# Patient Record
Sex: Female | Born: 1935 | Race: White | Hispanic: No | Marital: Married | Smoking: Never smoker
Health system: Southern US, Community
[De-identification: ages and names within clinical notes are randomized; demographics above are authoritative.]

## PROBLEM LIST (undated history)

## (undated) DIAGNOSIS — C189 Malignant neoplasm of colon, unspecified: Secondary | ICD-10-CM

## (undated) DIAGNOSIS — K219 Gastro-esophageal reflux disease without esophagitis: Secondary | ICD-10-CM

## (undated) DIAGNOSIS — R42 Dizziness and giddiness: Secondary | ICD-10-CM

## (undated) DIAGNOSIS — G8929 Other chronic pain: Secondary | ICD-10-CM

## (undated) DIAGNOSIS — M549 Dorsalgia, unspecified: Secondary | ICD-10-CM

## (undated) DIAGNOSIS — I10 Essential (primary) hypertension: Secondary | ICD-10-CM

## (undated) DIAGNOSIS — K56609 Unspecified intestinal obstruction, unspecified as to partial versus complete obstruction: Secondary | ICD-10-CM

## (undated) DIAGNOSIS — G629 Polyneuropathy, unspecified: Secondary | ICD-10-CM

## (undated) DIAGNOSIS — Z9221 Personal history of antineoplastic chemotherapy: Secondary | ICD-10-CM

## (undated) DIAGNOSIS — K5792 Diverticulitis of intestine, part unspecified, without perforation or abscess without bleeding: Secondary | ICD-10-CM

## (undated) DIAGNOSIS — M419 Scoliosis, unspecified: Secondary | ICD-10-CM

## (undated) DIAGNOSIS — M1990 Unspecified osteoarthritis, unspecified site: Secondary | ICD-10-CM

## (undated) DIAGNOSIS — I509 Heart failure, unspecified: Secondary | ICD-10-CM

## (undated) DIAGNOSIS — N281 Cyst of kidney, acquired: Secondary | ICD-10-CM

## (undated) DIAGNOSIS — D649 Anemia, unspecified: Secondary | ICD-10-CM

## (undated) DIAGNOSIS — K449 Diaphragmatic hernia without obstruction or gangrene: Secondary | ICD-10-CM

## (undated) DIAGNOSIS — K2270 Barrett's esophagus without dysplasia: Secondary | ICD-10-CM

## (undated) DIAGNOSIS — J329 Chronic sinusitis, unspecified: Secondary | ICD-10-CM

## (undated) DIAGNOSIS — E7800 Pure hypercholesterolemia, unspecified: Secondary | ICD-10-CM

## (undated) DIAGNOSIS — Z87828 Personal history of other (healed) physical injury and trauma: Secondary | ICD-10-CM

## (undated) HISTORY — PX: COLON SURGERY: SHX602

## (undated) HISTORY — DX: Barrett's esophagus without dysplasia: K22.70

## (undated) HISTORY — DX: Dorsalgia, unspecified: M54.9

## (undated) HISTORY — PX: BREAST BIOPSY: SHX20

## (undated) HISTORY — PX: COLON RESECTION: SHX5231

## (undated) HISTORY — PX: APPENDECTOMY: SHX54

## (undated) HISTORY — PX: OTHER SURGICAL HISTORY: SHX169

## (undated) HISTORY — DX: Anemia, unspecified: D64.9

## (undated) HISTORY — DX: Chronic sinusitis, unspecified: J32.9

## (undated) HISTORY — DX: Unspecified intestinal obstruction, unspecified as to partial versus complete obstruction: K56.609

## (undated) HISTORY — DX: Polyneuropathy, unspecified: G62.9

## (undated) HISTORY — DX: Malignant neoplasm of colon, unspecified: C18.9

## (undated) HISTORY — DX: Unspecified osteoarthritis, unspecified site: M19.90

## (undated) HISTORY — DX: Gastro-esophageal reflux disease without esophagitis: K21.9

## (undated) HISTORY — DX: Other chronic pain: G89.29

## (undated) HISTORY — DX: Dizziness and giddiness: R42

## (undated) HISTORY — DX: Pure hypercholesterolemia, unspecified: E78.00

## (undated) HISTORY — DX: Diaphragmatic hernia without obstruction or gangrene: K44.9

## (undated) HISTORY — DX: Essential (primary) hypertension: I10

## (undated) HISTORY — PX: EXCISIONAL HEMORRHOIDECTOMY: SHX1541

---

## 1980-02-15 HISTORY — PX: ABDOMINAL HYSTERECTOMY: SHX81

## 1989-02-14 HISTORY — PX: EXPLORATORY LAPAROTOMY: SUR591

## 1992-02-15 HISTORY — PX: CHOLECYSTECTOMY: SHX55

## 2004-03-09 ENCOUNTER — Ambulatory Visit: Payer: Self-pay | Admitting: Internal Medicine

## 2004-06-02 ENCOUNTER — Ambulatory Visit: Payer: Self-pay | Admitting: Internal Medicine

## 2004-08-19 ENCOUNTER — Ambulatory Visit: Payer: Self-pay | Admitting: Rheumatology

## 2004-10-05 ENCOUNTER — Ambulatory Visit: Payer: Self-pay | Admitting: Physical Medicine and Rehabilitation

## 2004-10-05 ENCOUNTER — Encounter
Admission: RE | Admit: 2004-10-05 | Discharge: 2005-01-03 | Payer: Self-pay | Admitting: Physical Medicine and Rehabilitation

## 2004-11-05 ENCOUNTER — Ambulatory Visit: Payer: Self-pay | Admitting: Physical Medicine and Rehabilitation

## 2004-12-01 ENCOUNTER — Ambulatory Visit: Payer: Self-pay | Admitting: Physical Medicine & Rehabilitation

## 2004-12-21 ENCOUNTER — Ambulatory Visit: Payer: Self-pay | Admitting: Unknown Physician Specialty

## 2005-01-28 ENCOUNTER — Encounter
Admission: RE | Admit: 2005-01-28 | Discharge: 2005-04-28 | Payer: Self-pay | Admitting: Physical Medicine and Rehabilitation

## 2005-01-28 ENCOUNTER — Ambulatory Visit: Payer: Self-pay | Admitting: Physical Medicine and Rehabilitation

## 2005-02-28 ENCOUNTER — Ambulatory Visit: Payer: Self-pay | Admitting: Physical Medicine and Rehabilitation

## 2005-04-18 ENCOUNTER — Ambulatory Visit: Payer: Self-pay | Admitting: Oncology

## 2005-04-20 ENCOUNTER — Ambulatory Visit: Payer: Self-pay | Admitting: Internal Medicine

## 2005-04-26 ENCOUNTER — Encounter
Admission: RE | Admit: 2005-04-26 | Discharge: 2005-07-25 | Payer: Self-pay | Admitting: Physical Medicine and Rehabilitation

## 2005-04-26 ENCOUNTER — Ambulatory Visit: Payer: Self-pay | Admitting: Physical Medicine and Rehabilitation

## 2005-07-15 ENCOUNTER — Ambulatory Visit: Payer: Self-pay | Admitting: Physical Medicine and Rehabilitation

## 2005-07-27 ENCOUNTER — Encounter
Admission: RE | Admit: 2005-07-27 | Discharge: 2005-10-25 | Payer: Self-pay | Admitting: Physical Medicine & Rehabilitation

## 2005-07-27 ENCOUNTER — Ambulatory Visit: Payer: Self-pay | Admitting: Physical Medicine & Rehabilitation

## 2005-09-01 ENCOUNTER — Ambulatory Visit: Payer: Self-pay | Admitting: Physical Medicine and Rehabilitation

## 2005-09-01 ENCOUNTER — Encounter
Admission: RE | Admit: 2005-09-01 | Discharge: 2005-11-30 | Payer: Self-pay | Admitting: Physical Medicine and Rehabilitation

## 2005-10-28 ENCOUNTER — Ambulatory Visit: Payer: Self-pay | Admitting: Physical Medicine and Rehabilitation

## 2005-10-31 ENCOUNTER — Ambulatory Visit (HOSPITAL_COMMUNITY)
Admission: RE | Admit: 2005-10-31 | Discharge: 2005-10-31 | Payer: Self-pay | Admitting: Physical Medicine & Rehabilitation

## 2005-11-25 ENCOUNTER — Encounter
Admission: RE | Admit: 2005-11-25 | Discharge: 2006-02-23 | Payer: Self-pay | Admitting: Physical Medicine and Rehabilitation

## 2006-01-19 ENCOUNTER — Ambulatory Visit: Payer: Self-pay | Admitting: Physical Medicine and Rehabilitation

## 2006-02-21 ENCOUNTER — Encounter
Admission: RE | Admit: 2006-02-21 | Discharge: 2006-05-22 | Payer: Self-pay | Admitting: Physical Medicine and Rehabilitation

## 2006-02-21 ENCOUNTER — Ambulatory Visit: Payer: Self-pay | Admitting: Physical Medicine and Rehabilitation

## 2006-03-21 ENCOUNTER — Ambulatory Visit: Payer: Self-pay | Admitting: Physical Medicine and Rehabilitation

## 2006-04-24 ENCOUNTER — Encounter
Admission: RE | Admit: 2006-04-24 | Discharge: 2006-07-23 | Payer: Self-pay | Admitting: Physical Medicine & Rehabilitation

## 2006-04-25 ENCOUNTER — Ambulatory Visit: Payer: Self-pay | Admitting: Physical Medicine & Rehabilitation

## 2006-04-26 ENCOUNTER — Ambulatory Visit: Payer: Self-pay | Admitting: Internal Medicine

## 2006-05-15 ENCOUNTER — Ambulatory Visit: Payer: Self-pay | Admitting: Physical Medicine and Rehabilitation

## 2006-05-17 ENCOUNTER — Ambulatory Visit (HOSPITAL_COMMUNITY)
Admission: RE | Admit: 2006-05-17 | Discharge: 2006-05-17 | Payer: Self-pay | Admitting: Physical Medicine and Rehabilitation

## 2006-06-13 ENCOUNTER — Encounter
Admission: RE | Admit: 2006-06-13 | Discharge: 2006-09-11 | Payer: Self-pay | Admitting: Physical Medicine and Rehabilitation

## 2006-07-14 ENCOUNTER — Ambulatory Visit: Payer: Self-pay | Admitting: Physical Medicine and Rehabilitation

## 2006-10-10 ENCOUNTER — Ambulatory Visit: Payer: Self-pay | Admitting: Physical Medicine and Rehabilitation

## 2006-10-10 ENCOUNTER — Encounter
Admission: RE | Admit: 2006-10-10 | Discharge: 2006-11-14 | Payer: Self-pay | Admitting: Physical Medicine and Rehabilitation

## 2006-11-22 ENCOUNTER — Encounter
Admission: RE | Admit: 2006-11-22 | Discharge: 2006-12-21 | Payer: Self-pay | Admitting: Physical Medicine & Rehabilitation

## 2006-11-23 ENCOUNTER — Ambulatory Visit: Payer: Self-pay | Admitting: Physical Medicine & Rehabilitation

## 2006-12-05 ENCOUNTER — Encounter
Admission: RE | Admit: 2006-12-05 | Discharge: 2007-03-05 | Payer: Self-pay | Admitting: Physical Medicine and Rehabilitation

## 2006-12-05 ENCOUNTER — Ambulatory Visit: Payer: Self-pay | Admitting: Physical Medicine and Rehabilitation

## 2006-12-06 ENCOUNTER — Ambulatory Visit (HOSPITAL_COMMUNITY)
Admission: RE | Admit: 2006-12-06 | Discharge: 2006-12-06 | Payer: Self-pay | Admitting: Physical Medicine and Rehabilitation

## 2007-01-03 ENCOUNTER — Ambulatory Visit (HOSPITAL_COMMUNITY)
Admission: RE | Admit: 2007-01-03 | Discharge: 2007-01-03 | Payer: Self-pay | Admitting: Physical Medicine and Rehabilitation

## 2007-01-30 ENCOUNTER — Ambulatory Visit: Payer: Self-pay | Admitting: Physical Medicine and Rehabilitation

## 2007-03-13 ENCOUNTER — Encounter
Admission: RE | Admit: 2007-03-13 | Discharge: 2007-06-11 | Payer: Self-pay | Admitting: Physical Medicine and Rehabilitation

## 2007-03-13 ENCOUNTER — Ambulatory Visit: Payer: Self-pay | Admitting: Physical Medicine and Rehabilitation

## 2007-04-20 ENCOUNTER — Ambulatory Visit: Payer: Self-pay | Admitting: Physical Medicine and Rehabilitation

## 2007-05-21 ENCOUNTER — Ambulatory Visit: Payer: Self-pay | Admitting: Physical Medicine and Rehabilitation

## 2007-05-22 ENCOUNTER — Ambulatory Visit: Payer: Self-pay | Admitting: Internal Medicine

## 2007-06-15 ENCOUNTER — Encounter
Admission: RE | Admit: 2007-06-15 | Discharge: 2007-09-13 | Payer: Self-pay | Admitting: Physical Medicine and Rehabilitation

## 2007-06-18 ENCOUNTER — Ambulatory Visit: Payer: Self-pay | Admitting: Physical Medicine and Rehabilitation

## 2007-07-16 ENCOUNTER — Ambulatory Visit (HOSPITAL_COMMUNITY)
Admission: RE | Admit: 2007-07-16 | Discharge: 2007-07-16 | Payer: Self-pay | Admitting: Physical Medicine and Rehabilitation

## 2007-07-16 ENCOUNTER — Ambulatory Visit: Payer: Self-pay | Admitting: Physical Medicine and Rehabilitation

## 2007-07-16 ENCOUNTER — Encounter: Admission: RE | Admit: 2007-07-16 | Discharge: 2007-10-14 | Payer: Self-pay | Admitting: Anesthesiology

## 2007-07-17 ENCOUNTER — Ambulatory Visit: Payer: Self-pay | Admitting: Anesthesiology

## 2007-08-07 ENCOUNTER — Ambulatory Visit: Payer: Self-pay | Admitting: Anesthesiology

## 2007-10-12 ENCOUNTER — Encounter
Admission: RE | Admit: 2007-10-12 | Discharge: 2008-01-10 | Payer: Self-pay | Admitting: Physical Medicine and Rehabilitation

## 2007-10-15 ENCOUNTER — Ambulatory Visit: Payer: Self-pay | Admitting: Physical Medicine and Rehabilitation

## 2007-11-12 ENCOUNTER — Ambulatory Visit: Payer: Self-pay | Admitting: Physical Medicine and Rehabilitation

## 2007-12-12 ENCOUNTER — Ambulatory Visit: Payer: Self-pay | Admitting: Physical Medicine and Rehabilitation

## 2007-12-25 ENCOUNTER — Ambulatory Visit: Payer: Self-pay | Admitting: Unknown Physician Specialty

## 2008-01-18 ENCOUNTER — Encounter
Admission: RE | Admit: 2008-01-18 | Discharge: 2008-04-17 | Payer: Self-pay | Admitting: Physical Medicine and Rehabilitation

## 2008-01-21 ENCOUNTER — Ambulatory Visit: Payer: Self-pay | Admitting: Physical Medicine and Rehabilitation

## 2008-02-06 ENCOUNTER — Inpatient Hospital Stay: Payer: Self-pay | Admitting: Vascular Surgery

## 2008-02-22 ENCOUNTER — Ambulatory Visit: Payer: Self-pay | Admitting: Internal Medicine

## 2008-03-05 ENCOUNTER — Ambulatory Visit: Payer: Self-pay | Admitting: Physical Medicine and Rehabilitation

## 2008-04-07 ENCOUNTER — Ambulatory Visit: Payer: Self-pay | Admitting: Physical Medicine and Rehabilitation

## 2008-05-05 ENCOUNTER — Encounter
Admission: RE | Admit: 2008-05-05 | Discharge: 2008-08-03 | Payer: Self-pay | Admitting: Physical Medicine and Rehabilitation

## 2008-05-07 ENCOUNTER — Ambulatory Visit: Payer: Self-pay | Admitting: Physical Medicine and Rehabilitation

## 2008-06-10 ENCOUNTER — Ambulatory Visit: Payer: Self-pay | Admitting: Internal Medicine

## 2008-06-30 ENCOUNTER — Ambulatory Visit: Payer: Self-pay | Admitting: Physical Medicine and Rehabilitation

## 2008-08-04 ENCOUNTER — Encounter
Admission: RE | Admit: 2008-08-04 | Discharge: 2008-11-02 | Payer: Self-pay | Admitting: Physical Medicine and Rehabilitation

## 2008-08-06 ENCOUNTER — Ambulatory Visit: Payer: Self-pay | Admitting: Physical Medicine and Rehabilitation

## 2008-09-23 ENCOUNTER — Ambulatory Visit: Payer: Self-pay | Admitting: Urology

## 2008-10-06 ENCOUNTER — Ambulatory Visit: Payer: Self-pay | Admitting: Physical Medicine and Rehabilitation

## 2008-10-27 ENCOUNTER — Ambulatory Visit: Payer: Self-pay | Admitting: Physical Medicine & Rehabilitation

## 2008-10-27 ENCOUNTER — Encounter
Admission: RE | Admit: 2008-10-27 | Discharge: 2008-10-27 | Payer: Self-pay | Admitting: Physical Medicine & Rehabilitation

## 2008-12-09 ENCOUNTER — Encounter
Admission: RE | Admit: 2008-12-09 | Discharge: 2009-02-05 | Payer: Self-pay | Admitting: Physical Medicine and Rehabilitation

## 2008-12-10 ENCOUNTER — Ambulatory Visit: Payer: Self-pay | Admitting: Physical Medicine and Rehabilitation

## 2009-01-19 ENCOUNTER — Ambulatory Visit: Payer: Self-pay | Admitting: Physical Medicine and Rehabilitation

## 2009-03-04 ENCOUNTER — Encounter
Admission: RE | Admit: 2009-03-04 | Discharge: 2009-06-02 | Payer: Self-pay | Admitting: Physical Medicine and Rehabilitation

## 2009-03-04 ENCOUNTER — Ambulatory Visit: Payer: Self-pay | Admitting: Physical Medicine and Rehabilitation

## 2009-04-20 ENCOUNTER — Ambulatory Visit: Payer: Self-pay | Admitting: Physical Medicine and Rehabilitation

## 2009-05-21 ENCOUNTER — Encounter
Admission: RE | Admit: 2009-05-21 | Discharge: 2009-08-19 | Payer: Self-pay | Admitting: Physical Medicine & Rehabilitation

## 2009-05-21 ENCOUNTER — Ambulatory Visit: Payer: Self-pay | Admitting: Physical Medicine & Rehabilitation

## 2009-07-02 ENCOUNTER — Ambulatory Visit: Payer: Self-pay | Admitting: Physical Medicine & Rehabilitation

## 2009-08-10 ENCOUNTER — Ambulatory Visit: Payer: Self-pay | Admitting: Physical Medicine & Rehabilitation

## 2009-09-21 ENCOUNTER — Ambulatory Visit: Payer: Self-pay | Admitting: Urology

## 2009-09-22 ENCOUNTER — Encounter
Admission: RE | Admit: 2009-09-22 | Discharge: 2009-12-21 | Payer: Self-pay | Admitting: Physical Medicine & Rehabilitation

## 2009-10-01 ENCOUNTER — Ambulatory Visit: Payer: Self-pay | Admitting: Physical Medicine & Rehabilitation

## 2009-11-02 ENCOUNTER — Ambulatory Visit: Payer: Self-pay | Admitting: Physical Medicine and Rehabilitation

## 2009-11-02 ENCOUNTER — Encounter
Admission: RE | Admit: 2009-11-02 | Discharge: 2010-01-31 | Payer: Self-pay | Source: Home / Self Care | Attending: Physical Medicine and Rehabilitation | Admitting: Physical Medicine and Rehabilitation

## 2009-11-30 ENCOUNTER — Ambulatory Visit: Payer: Self-pay | Admitting: Physical Medicine and Rehabilitation

## 2009-12-03 ENCOUNTER — Ambulatory Visit: Payer: Self-pay

## 2009-12-30 ENCOUNTER — Ambulatory Visit: Payer: Self-pay | Admitting: Physical Medicine and Rehabilitation

## 2010-02-15 ENCOUNTER — Encounter
Admission: RE | Admit: 2010-02-15 | Discharge: 2010-03-16 | Payer: Self-pay | Source: Home / Self Care | Attending: Physical Medicine and Rehabilitation | Admitting: Physical Medicine and Rehabilitation

## 2010-02-17 ENCOUNTER — Ambulatory Visit
Admission: RE | Admit: 2010-02-17 | Discharge: 2010-02-17 | Payer: Self-pay | Source: Home / Self Care | Attending: Physical Medicine and Rehabilitation | Admitting: Physical Medicine and Rehabilitation

## 2010-04-19 ENCOUNTER — Ambulatory Visit (HOSPITAL_COMMUNITY)
Admission: RE | Admit: 2010-04-19 | Discharge: 2010-04-19 | Disposition: A | Discharge status: Home / Self Care | Payer: Medicare Other | Source: Ambulatory Visit | Attending: Physical Medicine and Rehabilitation | Admitting: Physical Medicine and Rehabilitation

## 2010-04-19 ENCOUNTER — Ambulatory Visit (HOSPITAL_BASED_OUTPATIENT_CLINIC_OR_DEPARTMENT_OTHER): Payer: Medicare Other | Admitting: Physical Medicine and Rehabilitation

## 2010-04-19 ENCOUNTER — Other Ambulatory Visit: Payer: Self-pay | Admitting: Physical Medicine and Rehabilitation

## 2010-04-19 ENCOUNTER — Encounter: Payer: Medicare Other | Attending: Physical Medicine and Rehabilitation

## 2010-04-19 DIAGNOSIS — M79609 Pain in unspecified limb: Secondary | ICD-10-CM

## 2010-04-19 DIAGNOSIS — R52 Pain, unspecified: Secondary | ICD-10-CM

## 2010-04-19 DIAGNOSIS — M4310 Spondylolisthesis, site unspecified: Secondary | ICD-10-CM | POA: Insufficient documentation

## 2010-04-19 DIAGNOSIS — M545 Low back pain, unspecified: Secondary | ICD-10-CM | POA: Insufficient documentation

## 2010-04-19 DIAGNOSIS — M48061 Spinal stenosis, lumbar region without neurogenic claudication: Secondary | ICD-10-CM

## 2010-04-19 DIAGNOSIS — M539 Dorsopathy, unspecified: Secondary | ICD-10-CM | POA: Insufficient documentation

## 2010-04-19 DIAGNOSIS — M76899 Other specified enthesopathies of unspecified lower limb, excluding foot: Secondary | ICD-10-CM

## 2010-04-19 DIAGNOSIS — M51379 Other intervertebral disc degeneration, lumbosacral region without mention of lumbar back pain or lower extremity pain: Secondary | ICD-10-CM | POA: Insufficient documentation

## 2010-04-19 DIAGNOSIS — Z79899 Other long term (current) drug therapy: Secondary | ICD-10-CM | POA: Insufficient documentation

## 2010-04-19 DIAGNOSIS — G8929 Other chronic pain: Secondary | ICD-10-CM | POA: Insufficient documentation

## 2010-04-19 DIAGNOSIS — M25559 Pain in unspecified hip: Secondary | ICD-10-CM | POA: Insufficient documentation

## 2010-04-19 DIAGNOSIS — M4120 Other idiopathic scoliosis, site unspecified: Secondary | ICD-10-CM | POA: Insufficient documentation

## 2010-04-19 DIAGNOSIS — M1710 Unilateral primary osteoarthritis, unspecified knee: Secondary | ICD-10-CM | POA: Insufficient documentation

## 2010-04-19 DIAGNOSIS — Q762 Congenital spondylolisthesis: Secondary | ICD-10-CM | POA: Insufficient documentation

## 2010-04-19 DIAGNOSIS — M4000 Postural kyphosis, site unspecified: Secondary | ICD-10-CM | POA: Insufficient documentation

## 2010-04-19 DIAGNOSIS — M129 Arthropathy, unspecified: Secondary | ICD-10-CM | POA: Insufficient documentation

## 2010-04-19 DIAGNOSIS — M5137 Other intervertebral disc degeneration, lumbosacral region: Secondary | ICD-10-CM | POA: Insufficient documentation

## 2010-05-17 ENCOUNTER — Encounter
Payer: Medicare Other | Attending: Physical Medicine and Rehabilitation | Admitting: Physical Medicine and Rehabilitation

## 2010-05-17 DIAGNOSIS — M76899 Other specified enthesopathies of unspecified lower limb, excluding foot: Secondary | ICD-10-CM | POA: Insufficient documentation

## 2010-05-17 DIAGNOSIS — M47817 Spondylosis without myelopathy or radiculopathy, lumbosacral region: Secondary | ICD-10-CM | POA: Insufficient documentation

## 2010-05-17 DIAGNOSIS — M48061 Spinal stenosis, lumbar region without neurogenic claudication: Secondary | ICD-10-CM

## 2010-05-17 DIAGNOSIS — M4180 Other forms of scoliosis, site unspecified: Secondary | ICD-10-CM | POA: Insufficient documentation

## 2010-05-17 DIAGNOSIS — M5137 Other intervertebral disc degeneration, lumbosacral region: Secondary | ICD-10-CM | POA: Insufficient documentation

## 2010-05-17 DIAGNOSIS — M51379 Other intervertebral disc degeneration, lumbosacral region without mention of lumbar back pain or lower extremity pain: Secondary | ICD-10-CM | POA: Insufficient documentation

## 2010-05-17 DIAGNOSIS — M4716 Other spondylosis with myelopathy, lumbar region: Secondary | ICD-10-CM

## 2010-05-17 DIAGNOSIS — M545 Low back pain, unspecified: Secondary | ICD-10-CM | POA: Insufficient documentation

## 2010-05-17 DIAGNOSIS — M4120 Other idiopathic scoliosis, site unspecified: Secondary | ICD-10-CM

## 2010-05-17 DIAGNOSIS — M1710 Unilateral primary osteoarthritis, unspecified knee: Secondary | ICD-10-CM | POA: Insufficient documentation

## 2010-06-15 ENCOUNTER — Ambulatory Visit: Payer: Medicare Other

## 2010-06-29 NOTE — Assessment & Plan Note (Signed)
[name] is a very pleasant 77 year old woman who is accompanied by her  daughter to our Pain and Rehabilitative Clinic today.  She has a history  of chronic low back pain secondary to lumbar spondylosis.  She also has  a history of diminished ambulation capacity and has lumbar spinal  stenosis with a fairly significant lumbar curve/scoliosis.  She also has  intermittent trochanteric bursitis, iliotibial band syndrome, and  bilateral knee osteoarthritis.  She is back in to our clinic today for  brief recheck.  She does not need refill of her medications.  She has  been taking p.r.n. Mobic, Darvocet, and occasional oxycodone up to 1 to  half a tablet per day.  She has discontinued the Neurontin due to leg  cramps.  However, overall, she has been staying fairly functional.   In the last several weeks, she has been seen in our Physical Therapy  Department at [facility] and has been working on some  lower extremity strengthening and improving her balance.   She is back in today, states her average pain is about 5 on a scale of  10.  Her pain is about 8 on a scale of 10.  Pain is localized to the low  back, it radiates down both lateral hips, bilateral knees are involved  as well.   Pain interferes significantly with activities.  Pain is worse with  walking, bending, and standing.  Improves with rest, heat, medication,  and injection.  She gets fair relief with current meds.   MEDICATIONS:  Which are provided through this clinic include;  1. Mobic on a p.r.n. basis 7.5 daily.  2. Lidoderm p.r.n.  3. Neurontin has been discontinued due to leg cramps.  4. Darvocet-N 100 up to 1-2 tablets per day and p.r.n. up to 1-1/2 to      1 tablet per day of oxycodone IR.   She is living independently.  She is able to take care of her self care  needs.  She admits to some anxiety, denies depression and anxiety.  She  is able to walk few minutes without pain.  She does use a cane for  ambulation.  She is able to climb stairs and she is able to drive.   Past medical, social, and family are otherwise unchanged.   PHYSICAL EXAMINATION:  VITAL SIGNS:  Blood pressure is 144/71, pulse is  70, respirations 18, and 96% saturation on room air.  GENERAL:  She is a well-developed, well-nourished elderly female who  does not appear in any distress and appears her stated age.   She is oriented x3.  Speech is clear.  Affect is bright.  She is alert,  cooperative and pleasant.  She follows commands without any difficulty.   Cranial nerves and coordination are grossly intact.  Reflexes are  diminished in the lower extremities.  Sensation is overall intact in the  lower extremities with light touch.  Her motor strength is 5/5 at hip  flexors, knee extensors, dorsiflexors, and plantar flexors.  She is  unable to give full strength in the right hip flexor due to pain in the  low back when she attempts to flex against pressure.   She is able to transition from sitting to standing by pushing off with  her arms.  Her gait is relatively stable.  She does have difficulty with  tandem gait, is unable to perform this.  Romberg test is, however,  performed adequately.  She has limitations in the  lumbar motion in all  planes.  Pain is noted with extension.   IMPRESSION:  1. History of lumbar spondylosis/scoliosis/spinal stenosis.  2. Bilateral trochanteric bursitis with iliotibial band syndrome,      right worse than left.  3. Bilateral knee osteoarthritis.   PLAN:  Continue physical therapy program to work on lower extremity  strength deficits and balance.  Call over to [facility] at  [phone], suggested considering ultrasound for iliotibial band syndrome  and  trochanteric bursitis.  They will consider this at her next visit.  I will see her back in a month.  She does not need refills on any of her  medications today.   Her family doctor apparently has arranged for her  to follow with her  neurosurgeon Dr. Dorothe Pea who is in [place].           ______________________________  Brantley Stage, M.D.     DMK/MedQ  D:  11/12/2007 13:08:51  T:  11/13/2007 02:32:30  Job #:  [id number]

## 2010-06-29 NOTE — Assessment & Plan Note (Signed)
[name] is a 77 year old woman who is being followed in our pain and  rehabilitative clinic for predominantly low back and leg pain.   She is known to have lumbar spondylosis/scoliosis and has intermittent  nerve root irritation.  She also has intermittent trochanteric bursitis  as well as mild knee osteoarthritis.   She is back in today and states that after the injection on January 03, 2007 into her left hip she had overall improvement of her leg pain and  had very little pain for at least 3 weeks afterwards.  She has some mild  discomfort now going down both legs which does not appear to be related  to her bursitis.   She also had noted that when she was taking Neurontin 300 mg at night  did not seem to give her as much relief during the day with when she was  taking 100 mg in the morning 2 tablets.  She is requesting to return  back to the dose of Neurontin which included 100 mg two in the morning  and 3 in the evening.   She states she can walk a bit longer and has less leg pain when she is  on little bit higher dose of Neurontin.   Her pain currently in the office is about a 4 to a 5 on a scale of 10,  worst pain is about an 8 this month.  Pain is described as stabbing,  sharp and burning.  She has had some intermittent low back pains over  the last few weeks, sharp pain in the left buttock region.  However,  neither the leg pain nor the buttock or back pain is present this  morning, overall she is doing good toady.   Pain is typically worse with walking and standing, improves with rest,  heat, medication and injections.  She is getting good relief with the  current medications prescribed by our clinic.  Pill counts today reveal  she really has not used any Darvocet in the last month, she has a full  prescription yet and she has 19 oxycodone left from 30 that was  prescribed last month.  She states that she has not needed this  medication after the injection because she  got such a good amount of  relief with the injections.   Also, she takes Mobic 7.5 once a day, Neurontin she is currently on 300  mg at night, she uses Lidoderm on a p.r.n. basis as well.   She is limited significantly in how far she can walk, she is independent  with her self care, needs some assistance with higher level household  activities.   Admits to trouble walking and occasional dizziness.  She has had inner  ear problems intermittently.   Denies depression or suicidal ideation.   Past medical, social, family history are all unchanged from last visit.   EXAMINATION:  Blood pressure is 164/75, pulse 82, respirations 28, 97%  saturated on room air.  She is a well-developed, well-nourished elderly  female who appears her stated age.  She is oriented x3, her speech is  clear, her affect is bright.  She is alert, cooperative and pleasant.  She follows command without difficulty.   Transitions quite easily from sitting to standing today, gait displays  good balance overall.  Had some difficulty with tandem gait but overall  does fairly well.  Romberg's test is performed adequately as well.   Reflexes are diminished in the lower extremities, motor  strength is 5/5  at hip flexors, knee extensors, dorsiflexors and plantar flexors.  Tenderness is noted along the medial joint line in both lower  extremities at the knees.  No effusion is appreciated and she has good  joint stability bilaterally.   Radiographs were reviewed with her today, she had right knee radiographs  done January 03, 2007 read by Dr. Ruffin Frederick.  Shows no acute right knee  fracture or subluxation, mild narrowing of the medial joint compartment  was noted.   These were reviewed with [name] today as well.   IMPRESSION:  1. Overall improvement in right trochanteric bursitis, status post      injection last month.  2. Lumbar spondylosis/scoliosis with symptoms of spinal stenosis as      well.  3. Mild knee  osteoarthritis, medial compartment.   PLAN:  We will refill the following medications for [name] today to  include:  1. Neurontin 100 mg two tablets in the morning, one tablet in the p.m.      and three tablets p.o. nightly.  She will drop the afternoon dose      if she has any dizziness or oversedation from it.  2. We will also refill her Mobic 7.5 mg one p.o. daily p.r.n. #30.      She is tolerating this well, has no abdominal complaints, has been      on this many years.   Will see her back in a month.  Encouraged her to use her cane when she  is out in the community walking.  She has been stable on the above  medications and has not had any problems with oversedation, constipation  or balance problems from them.           ______________________________  Brantley Stage, M.D.     DMK/MedQ  D:  01/31/2007 11:26:54  T:  01/31/2007 16:12:16  Job #:  [id number]

## 2010-06-29 NOTE — Assessment & Plan Note (Signed)
[name] is an 77 year old married female who is followed in our Pain  and Rehabilitative Clinic for multiple chronic pain complaints, which  revolved mostly around her low back and lower extremities.   She is felt to have a component of lumbar spinal stenosis.  She has  bilateral knee osteoarthritis and intermittent trochanteric bursitis.   She states average pain over the last month is improved.  From a month  previous, her average pain is about 6 on a scale of 10.  She states that  she is walking and able to be up standing no longer, last month it was  less than 5 minutes, this month it has been about 10 minutes.  She  attributes this to taking 1 Darvocet every morning, now with her Lyrica.  She finds the Lyrica to be beneficial as well.  In the last month, she  has used one 5 mg oxycodone tablets.   Pain again is typically localized to the posterior hip region radiating  down the leg laterally to the foot, especially on the right.   She uses a cane for ambulation.  She is able to climb stairs and drive.  She is independent with self-care.  Admits to some anxiety, but denies  depression or suicidal ideation.  Denies problems controlling bowel or  bladder.  Denies problems with oversedation or constipation.   Past medical, social, and family history were unchanged from previous  visit.   PHYSICAL EXAMINATION:  VITAL SIGNS:  Blood pressure 144/64, pulse 58,  respiration 18, and 97% saturated on room air.  GENERAL:  She is a well-developed, well-nourished elderly female, who  does not appear in any distress.  She is oriented x3.  Speech is clear.  Affect is bright.  She is alert, cooperative, and pleasant.  Follows  commands without difficulty and answers questions appropriately.  EXTREMITIES:  She transitions from sitting to standing with ease, gait  in the room is not antalgic, slow, and careful, some difficulty with  tandem gait.  Romberg test is performed adequately.   He is  able to step up to the exam table.  Reflexes are diminished at  bilateral patellar and Achilles tendons.  No abnormal tone is noted.  No  clonus is noted.  No tremors are appreciated.   She has good strength in both lower extremities, 5/5 at hip flexors,  knee extensors, dorsiflexors, and plantar flexors.   Limitations in range of motion in lumbar spine in all planes.  Internal  and external rotation of hips does not aggravate her today.   IMPRESSION:  1. Lumbago with history of lumbar spondylosis.  2. Lower extremity symptoms, suggestive of spinal stenosis.  3. Intermittent trochanteric bursitis.  4. History of bilateral knee osteoarthritis.   PLAN:  The patient does not need refills on her medications today.  She  has used 1 oxycodone in the last month, she has 31 left from previous  refill.  She has 29 Darvocet-N 100 left.  She typically is taking 1  Darvocet-N 100 per day in the morning and she typically does not use  oxycodone not more than 1 time per month, she uses it for severe pain.  We will see her back in 2 months.  She has a visit with Dr. Amanda Pea  coming up September 18, 2008.  [name]  has been taking medications as prescribed.  She does not exhibit any  Aberrant behavior with the use .  Pill counts are appropriate and she  reports overall improved functional status with the use.  We will see  her back in 2 months.           ______________________________  Brantley Stage, M.D.     DMK/MedQ  D:  08/06/2008 12:15:41  T:  08/07/2008 01:31:28  Job #:  [id number]

## 2010-06-29 NOTE — Assessment & Plan Note (Signed)
[name] is a very pleasant married 77 year old woman who is  followed in our Pain and Rehabilitative Clinic for chronic low back pain  and leg pain.  She is back in today and states she has been having some  trouble with pain radiating from the low back around the right lateral  hip to the right groin and down the inside of her leg on the right.   She also has bilateral lateral leg pain and pain in the low back  regions.  Her average pain is about a 9 currently in our clinic.  Pain  is described as constant, burning, stabbing, and aching in nature.   She reports that the trochanteric bursitis injection that she received  on Jun 18, 2007 has significantly helped her pain and the right lateral  leg pain is significantly improved after the injection.   She reports overall good relief with medications that she uses.  She has  been on Mobic 7.5 mg daily.  She has been off of it to enable her to  undergo an epidural steroid injection, and she states that her pain has  been much worse since she has been off of her Mobic.  Because of this,  she has increased the use of her Darvocet a little more, as well as she  has taken a few more oxycodone.  She typically does not use more than 2  Darvocet a day nor more than 1 oxycodone per day.   She is limited in how long she can ambulate.  It is variable, but not  much more than 10 minutes.  She is able to climb stairs, but slowly, and  she is able to drive short distances.   She is independent with feeding, dressing, bathing, and toileting.  Requires some assistance with meal prep, household duties, and shopping.   She admits to some anxiety, but denies depression, anxiety, or suicidal  ideation.  Denies problems controlling bowel or bladder.  Review of  systems otherwise noncontributory.   PAST MEDICAL/SOCIAL/FAMILY HISTORY:  Unchanged since our last visit on  Jun 18, 2007.   MEDICATIONS PRESCRIBED BY THIS CLINIC:  1. Mobic 7.5 daily.   However, she has been off it at least a week, I      believe, at this time.  2. Neurontin 300 mg 1 p.o. nightly.  3. Darvocet-N 100 one p.o. b.i.d. p.r.n.  4. Oxycodone 5 mg 1 p.o. daily.  5. Lidoderm p.r.n.   EXAM:  Her blood pressure is 136/59, pulse 58, respirations 18, 98%  saturation on room air.  She is a well-developed elderly female who does not appear in any  distress.   She is oriented x3.  Her speech is clear.  Her affect is bright, alert,  cooperative, and pleasant, and she follows commands without any  difficulties.  She is able to transition from sitting to standing  without much difficulty today.  Her gait in the room is antalgic with  decreased weightbearing on the right.  She has limitations in lumbar  motion in all planes.  Her reflexes are 0 at the patellar tendons, as  well as at the Achilles tendons.  She has 5/5 strength at left hip  flexors, knee extensors, dorsiflexors, and plantar flexors.  She has 4/5  strength at right hip flexors and knee extensors.  Dorsiflexors and  plantar flexors on the right are 5/5.   Internal and external rotation is mildly limited in both right and left  hip,  but do not increase her pain in the groin or in the posterior hip.   Overall, her balance is quite good.  She has normal tone throughout.  She has some mild tenderness over the trochanters, however much improved  on the right today.   IMPRESSION:  1. History of lumbar spondylosis and lumbar spinal stenosis.  Ms.      Yazzie has a lumbar scoliosis where the apex of the curve is located      at L2-3 and convex to the left.  2. Bilateral trochanteric bursitis, which is overall improved after      last injections.  3. Mild bilateral knee osteoarthritis, especially in the medial      compartment.   PLAN:  I would like her to follow up with Dr. Stevphen Rochester for epidural  steroid injections.  [name] has multilevel lumbar degenerative  changes with a lumbar curve with the apex at  L2-3 convex left.  I  believe some of the pain that she is experiencing radiating from the  back wrapping around the right anterior thigh to the groin and down the  leg medially may be related to one of the upper lumbar roots being  irritated.  I would like her to follow up with Dr. Stevphen Rochester for epidural  steroid injections to see if we could help her with some of this  proximal leg pain.  She states she is not having much trouble with left  leg pain at this time, but on occasion, she does have some problems with  it.  She will follow up with Dr. Stevphen Rochester tomorrow, and I will see her  back in a month.  She does not need any refills on her medications at  this time.  [name] uses her medications as prescribed.  She has not  exhibited any aberrant behavior.  She has appropriate pill count and she  has not suffered any complications from these medications causing  oversedation or constipation.  She is able to maintain a functional  lifestyle, albeit somewhat limited at times due to her leg pain.           ______________________________  Brantley Stage, M.D.     DMK/MedQ  D:  07/16/2007 13:46:57  T:  07/16/2007 14:30:42  Job #:  [id number]

## 2010-06-29 NOTE — Assessment & Plan Note (Signed)
[name] is a very pleasant 77 year old married woman who is followed  in our Pain and Rehabilitative Clinic predominantly for low back and leg  pain.  Her low back and leg pain are felt to be related to lumbar  spondylosis/scoliosis, and spinal stenosis.  Intermittently she is also  bothered by trochanteric bursitis.   She is back in today and states her average pain had been about an 8 on  a scale of 10.  However, today and yesterday, she has been doing very  well, pain down to a 4.  Her pain is typically located in the low back  region, radiates laterally down the legs.  Pain is typically worse when  she is up standing and walking, improves with rest, heat and medication  as well as injections.   She is very limited in how far she can walk.  She can climb stairs.  She  is able to drive short distances.  She is independent with her self-  care.  She is limited in making meals and doing her household duties and  shopping.   She admits to some anxiety, denies suicidal ideation.   REVIEW OF SYSTEMS:  Otherwise noncontributory.   PAST MEDICAL, SOCIAL AND FAMILY HISTORY:  Noncontributory.  No changes  from last month.   Medications provided by our clinic include the following:  1. Mobic 7.5 mg 1 p.o. daily.  2. Neurontin 300 mg at night and one to two 100 mg tablets during the      day.  3. Darvocet-N 100 one p.o. b.i.d. on a p.r.n. basis.  4. Lidoderm p.r.n.  5. Percocet 2.5/325 one per day on a p.r.n. basis.   Pill counts are done today with her.  She has 19 Percocet left and 25  Darvocet-N 100s left, and she is not requesting any refill of her pain  medications.   PHYSICAL EXAMINATION:  On exam today her blood pressure is 152/65, pulse  68, respirations 18, 96% saturated on room air.  She is a well-  developed, well-nourished elderly female who appears her stated age.  She is oriented x3 and her speech is clear.  Her affect is bright.  She  is alert, cooperative and  pleasant, and she follows commands easily.   Her gait is In the room is assessed.  She stands easily from a seated  position.  Tandem gait is assessed and she does have a little difficulty  with this.  Romberg's test is performed adequately however.  She has  limitations in lumbar motion in all planes.  No tenderness is noted  along the spinous processes of her thoracic or lumbar spine.  She has  some minimal tenderness in the paraspinal musculature however.  She also  has some tenderness along the trochanters and along the iliotibial band  bilaterally.   Her reflexes are diminished in the lower extremities.  No abd normal  tone is noted.  No clonus is noted.  Her motor strength is quite good  however.  Sensory is within normal limits and is intact to light touch  in the lower extremities.   IMPRESSION:  1. Lumbar spondylosis/iliosis/spinal stenosis.  2. Bilateral lower extremity lateral hip and leg pain overall      improved.  3. Mild knee osteoarthritis especially medial compartment.   PLAN:  She brings in a prescription of Mobic from last month.  She is  requesting a new prescription.  She is concerned the pharmacy will not  fill this  one from last month for her, so we will rewrite her Mobic for  her, 7.5 one p.o. daily p.r.n.#30 with 2 refills.  We will also give her  a prescription for Neurontin 300 mg 1 p.o. at night #30 with 2 refills.  She has been stable on the above medications.  She has not displayed any  evidence of aberrant behavior.  She uses her medications as directed.  She does not drive while she takes oxycodone or Darvocet, and she has  had overall improvement in her pain scores and functional status with  their use.  We will see her back in a month.  Anticipate refill of her  Darvocet and Percocet next month.           ______________________________  Brantley Stage, M.D.     DMK/MedQ  D:  03/14/2007 14:19:16  T:  03/14/2007 20:47:04  Job #:   [id number]

## 2010-06-29 NOTE — Procedures (Signed)
NAMEDEAISHA, [name] NO.:  [id number]   MEDICAL RECORD NO.:  [id number]           PATIENT TYPE:   LOCATION:                                 FACILITY:   PHYSICIAN:  Celene Kras, MD        DATE OF BIRTH:  [birth year]   DATE OF PROCEDURE:  DATE OF DISCHARGE:                               OPERATIVE REPORT   SUBJECTIVE:  [name] in [facility].  I  evaluated her.  Reviewed history and performed 14-point review of  systems.  She comes today for second intervention.  Excellent response  to first intervention about a 50% relief cycle, some recurrence of leg  pain, but overall doing well.  Nothing new neurologically.  I do not  think we will need to go on to a third intervention.  We will follow her  expectantly.  Discussed modifiable features and health profile, reviewed  medications, she is appropriate.  Discussed home based therapy.   OBJECTIVE:  Diffuse paralumbar myofascial tenderness.  __________ test  positive side bending.  Straight leg with impaired right and left, but  actually improved from an overall myofascial mechanical standpoint.  Nothing new neurologically.   IMPRESSION:  Degenerative spine disease lumbar spine, radiculopathy.   PLAN:  Lumbar epidural.  She is consented.   PROCEDURE:  The patient taken to the fluoroscopy suite and placed in  prone position.  Back prepped and draped in the usual fashion.  Using a  Hustead needle, I advanced into the L5-S1 interspace without any  evidence of CSF, heme, or paresthesia.  Test block uneventfully followed  with 40 mg Aristocort, flushed needle.   Tolerated the procedure well.  No complications from procedure.  Appropriate recovery.  Discharge instructions given.           ______________________________  Celene Kras, MD     HH/MEDQ  D:  08/07/2007 12:50:53  T:  08/08/2007 05:29:34  Job:  [id number]

## 2010-06-29 NOTE — Assessment & Plan Note (Signed)
[name] is a very pleasant married 77 year old woman who is followed  in our pain and rehabilitative clinic for chronic low back pain and leg  pain.   She is known to have lumbar spondylosis and degenerative changes as well  as intermittent trochanteric bursitis and iliotibial band syndrome.   She is back in today requesting an injection to the right hip.   She has gotten to the point where, when she stands up and walks, her hip  bothers her quite a bit.  She is having trouble sleeping at night  because, when she lays on it or rolls, her lateral hip bothers her as  well.  She describes it as stinging and burning discomfort and tender  with pressure over the trochanter region.   She states her average pain is about 9 right now on a scale of 10.  Sleep is fair, although she has had some poor nights.  She is worse with  walking, bending, standing.  Improves with rest, heat, medication.  Injections typically help her as well.   She gets fair relief with current meds that she is on.   MEDICATIONS PROVIDED BY THIS CLINIC INCLUDE:  1. Mobic 7.5 one p.o. daily.  2. Neurontin 300 mg at night.  3. Darvocet on a p.r.n. basis.  4. She also uses p.r.n. extra strength Tylenol using less than 2000 mg      per day.  5. And occasionally she will take oxycodone for more severe type pain.      She used approximately 4 tablets in the last month of the      oxycodone.   FUNCTIONAL STATUS:  She is able to walk about 3-5 minutes at a time.  She has difficulty with stairs, she can go up slowly.  She currently is  able to drive.  She is independent with dressing and feeding, showering,  toileting.  Needs some assistance with higher level activities.   She admits to some anxiety.  Denies depression or suicidal ideation.   REVIEW OF SYSTEMS:  Otherwise, negative.   PAST MEDICAL HISTORY:  Unremarkable.   SOCIAL HISTORY:  Remarkable for recent deaths in her family, both her  brother and sister have  died within the last month and she is clearly  upset by this and is grieving.   FAMILY HISTORY:  Is otherwise unchanged.   EXAM:  Today, blood pressure is 133/42, pulse 57, respiration 16-18,  100% saturated on room air.  She is a well-developed, elderly female who  appears her stated age.  She does not appear in any distress.  She is  oriented x3.  Speech is clear.  Affect is bright.  She is alert,  cooperative, and pleasant and follows commands easily.  Gait in the room  is antalgic.  Decreased weightbearing to the right lower extremity.  Her  reflexes are, overall, diminished in the lower extremities.  She has  normal tone, however.  Her balance is good.  She has significant  tenderness throughout the right trochanter region diffusely and down the  iliotibial band as well.   IMPRESSION:  1. History of lumbar spondylosis and lumbar spinal stenosis.  2. Bilateral trochanteric bursitis, however, today right is      significantly worse than the left.  She has mild bilateral knee      osteoarthritis as well, especially in the medial compartment.   PLAN:  The patient requested injection into the right trochanter today.  She has had success with  these in the past.  A consent form was  obtained.  I reviewed the risks and benefits of this particular  procedure including the possibility of increased pain, bleeding,  bruising, nerve injury, infection.  She elects to proceed with the  injection.   The area was prepped with Betadine and also cleaned with alcohol.  A 25  gauge needle was used to inject 1 cc of Kenalog and 3 cc of 1% lidocaine  into the area of the right trochanter.  The patient tolerated the  procedure well.  The puncture wound was dressed with a Band-Aid.  She  was able to get off the exam table and walk in the room afterward  reporting slight improvement in her pain at that time.   DISCHARGE INSTRUCTIONS:  Were given including icing the area over the  next 24 hours  intermittently and to let us know if she develops any kind  of fever or redness or increased pain in that area.  A prescription for  Mobic was written, 7.5 mg one p.o. daily #30, 2 refills, and I will see  her back in a month.  She has been stable on these medications.           ______________________________  Brantley Stage, M.D.     DMK/MedQ  D:  06/20/2007 11:52:31  T:  06/20/2007 12:14:55  Job #:  [id number]

## 2010-06-29 NOTE — Assessment & Plan Note (Signed)
[name] is a 77 year old female, who is followed in our Pain and  Rehabilitative Clinic for multiple chronic pain complaints, which  include low back pain, bilateral hip pain which is felt secondary to  multilevel lumbar spondylosis and stenosis, also has intermittent  trochanteric bursitis, and bilateral knee osteoarthritis.   She had a recent appointment with Dr[facility].  In his assessment, he noted she has chronic intractable  low back pain and bilateral thigh pain, paresthesias associated with  multilevel severe lumbar degenerative disk disease/spondylosis as well  as multilevel relatively fixed spondylolisthesis, mild to moderate  multilevel foraminal and lateral recess stenosis without nerve  impingement per imaging studies as well as bilateral hip pain with  suspicion of bursitis, and degenerative disease.   He indicated that there was nothing he could do from a surgical  standpoint for her and recommended continual followup with physiatrist  as well as repeat lumbar epidural steroid injections.   [name] is back in today and continues to have pain complaints in her  posterior hip radiating down her legs bilaterally worse with standing,  improves with rest, and improves with medications.  Reports fair to good  relief with current meds.   FUNCTIONAL STATUS:  She is able to walk about 5 minutes uses an  assistive device.  She is able to climb stairs still slowly.  She is  able to drive short distances.   She is independent with feeding, dressing, bathing, and toileting.  She  needs assistance with meal prep, household duties, shopping.   Admits to intermittent tingling in her legs, trouble walking, anxiety,  denies depression, or suicidal ideation.   REVIEW OF SYSTEMS:  Otherwise, noncontributory.   Past medical, social, family history are unchanged at this time.   Medications provided through this clinic include:  1. Mobic 7.5  daily.  2. Darvocet-N 100, 1-2 tablets per day p.r.n.  3. Lyrica 25 mg daily.  4. Lidoderm p.r.n.  5. Oxycodone IR 5 mg one half a tablet to one full tablet p.r.n.  She      rarely uses this medication.   PHYSICAL EXAMINATION:  VITAL SIGNS:  On exam today, her blood pressure  is 135/66, pulse 55, respirations 18, 99% saturated on room air.  GENERAL:  She is a well-developed, well-nourished, mildly obese elderly  female, who appears her stated age and does not appear in any distress.  She is oriented x3.  Speech is clear.  Affect is bright.  She is alert,  cooperative, and pleasant.  Follows commands without difficulty, answers  my questions appropriately.  NEUROLOGIC:  Cranial nerves and coordination are intact.  Sensation is  intact in the lower extremities.  EXTREMITIES:  Motor strength is 5/5 at hip flexors, knee extensors,  dorsiflexors, plantar flexors.  She is able to rise from the chair  without pushing off with her upper extremities.  Gait in the room is  slightly antalgic, but stable.  Mild difficulty with tandem gait.  Romberg test is performed adequately.   Limitations noted in the lumbar range of motion in all planes.   Tenderness along the lateral hips bilaterally some tenderness over the  joint line of both knees especially medially.   IMPRESSION:  1. Chronic low back pain.  2. Multilevel severe lumbar degenerative disk      disease/spondylosis/multilevel relatively fixed      spondylolisthesis/no significant central stenosis/multilevel mild      to moderate foraminal and  lateral recess stenosis.  3. Symptoms of neurogenic claudication.  4. Intermittent trochanteric bursitis.  5. Bilateral knee osteoarthritis.   PLAN:  The patient is requesting repeat lumbar epidural injection.  Her  last one was about with a little over a year ago.  She reports she did  have good relief with that.  She has been hesitant to have these  repeated because she does not like going off  her Mobic, as her Mobic  does help her osteoarthritis and her other joints significantly.   We will have this scheduled for her.  We will refill her Mobic as well  as her Darvocet-N 100, which she can take up to twice a day.  She is  interested in the arthritis pool program at the [facility]  I think, she will  do well with this.  I recommend that she follows through with this  option.  She may be interested in more of conditioning-type program  after the epidural steroid injection.  We will assess her after this is  completed.  We will see her back in a month.           ______________________________  Brantley Stage, M.D.     DMK/MedQ  D:  10/06/2008 12:28:30  T:  10/07/2008 02:56:09  Job #:  [id number]   cc:   Dale [place]  Fax: [phone]

## 2010-06-29 NOTE — Procedures (Signed)
[name], [name]                 ACCOUNT NO.:  [id number]   MEDICAL RECORD NO.:  [id number]          PATIENT TYPE:  REC   LOCATION:  [facility]                          FACILITY:  [facility]   PHYSICIAN:  Erick Colace, M.D.DATE OF BIRTH:  03-10-1928   DATE OF PROCEDURE:  11/23/2006  DATE OF DISCHARGE:                               OPERATIVE REPORT   This is a right L4 transforaminal lumbar epidural steroid injection  under fluoroscopic guidance.   INDICATIONS:  Right greater than left lower extremity radicular pain.  Has a history of lumbar stenosis with radiculopathy only partially  responsive to medication management including narcotic analgesics and  other conservative care.   Pain interferes with her activities of daily living such as meal prep,  household duties and shopping.   Informed consent was obtained after describing risks and benefits of  procedure to the patient.  These  include bleeding, bruising, infection,  loss of bowel or bladder function, temporary or permanent paralysis.  She elected to proceed and has given written consent, the patient placed  prone on fluoroscopy table.  Betadine prep, sterile drape 25-gauge 1-1/2  inch needle was used to anesthetize skin and subcu tissue 1% lidocaine  x2 ccs and 22 gauge 3-1/2 inch spinal needle was inserted under  fluoroscopic guidance starting at the right L4-5 intervertebral foramen.  AP lateral oblique images utilized.  Omnipaque 180 x 0.5 mL demonstrated  good epidural spread and then a solution containing 1 mL of 40 mg/mL  Depo-Medrol and 2 mL of 1% MPF lidocaine were injected.  The patient  tolerated procedure well.  Pre and post injection vitals stable.  Return  in one month for left side depending on how she is doing after she  follows up with Dr. Pamelia Hoit in two weeks.  Pre injection pain level  9/10, post injection 7/10.  Pain levels appear to be dropping per the  patient report.      Erick Colace,  M.D.  Electronically Signed     AEK/MEDQ  D:  11/23/2006 09:33:09  T:  11/23/2006 14:12:32  Job:  [id number]

## 2010-06-29 NOTE — Assessment & Plan Note (Signed)
HISTORY OF PRESENT ILLNESS:  [name] is an extremely pleasant 77-year-  old married female who is being followed in our pain and rehabilitative  clinic for multiple pain complaints.   She is known to have lumbar spondylosis and scoliosis with apex at L2.  She had a history of multi-level lumbar stenosis and intermittent  trochanteric bursitis.   She is back in today and states that she had undergone a right L4  transforaminal epidural injection by Dr. Wynn Banker on November 23, 2006.  She had overall improvement from this injection for several days. Her  pain was more so on the left side, however, over the weekend, she  developed some right sided pain in the lateral hip again, in the buttock  area, radiating to the right knee.   She states that she is having some trouble just walking right now. She  is using a cane to help her ambulate.   She reports she had also gotten quite a bit of relief with the  trochanteric bursitis injection in the left hip. That improved her left  side lateral hip pain significantly and made it easier for her to sleep  at night.   Her biggest complaint today is the right hip pain. He describes her pain  as about a 9 to 10 on a scale of 10. The pain is typically worse with  walking and standing. Improved with rest, medication, and the  injections.   She is really quite limited in how many minutes she can walk. It is less  than 1 at this point. She can climb stairs but very slowly. She is able  to drive.   She is independent with her self care. She is requiring some assistance  with higher level activities in the home. She admits to some anxiety but  denies depression and anxiety.   REVIEW OF SYSTEMS:  Otherwise negative. She has been healthy in the last  month. No new medical or surgical problems.   She continues to maintain contact with Dr. Dale Brainerd of [facility]  at the [facility]. Her next visit with Dr. Lorin Picket is in February of   2009.   MEDICATIONS:  Prescribed by this clinic include the following:  1. Mobic 7.5 1 p.o. daily p.r.n.  2. Neurontin 300 mg q.h.s.  3. Darvocet N-100 on a p.r.n. basis. She has 15 tablets left.  4. Lidoderm p.r.n.  5. Oxycodone 5 mg 1/2 tablet p.r.n. She has 26 tablets left as well.   PHYSICAL EXAMINATION:  VITAL SIGNS:  Blood pressure 134/74, pulse 61,  respiratory rate 18, 97% saturated on room air.  GENERAL:  A well developed female who appears her stated age and does  not appear in any distress.  NEUROLOGIC:  Oriented x3. Speech is clear. Affect is bright. She is  alert. She is cooperative and pleasant. Follows commands without any  difficulties.   Transitioning from sit to stand, she displayed some pain behavior and  stiffness as she got up. Her gait in the room is clearly antalgic.  Decreased weight bearing in stance phase in the right lower extremity.  Her balance, however, is good. Seated her reflexes are 1+ at the patella  and Achilles tendon. No abnormal tone is noted. Her motor strength is  good in the lower extremities, 5/5 at hip flexors and knee extensor,  dorsi-flexors, plantar flexors, and [employer] exerting. Right hip flexion does  increase her pain somewhat, however, in the hip region. Sensory  examination is unchanged.  IMPRESSION:  1. Increasing right hip pain with ambulation. Pain is also noted on      physical examination with internal and external rotation.  2. Lumbar scoliosis, apex at L2.  3. Lumbar spondylosis.  4. History of multi-level lumbar stenosis with diminished ambulation      capacity.  5. Improvement of left trochanteric bursitis/ileo-tibial  __________ Syndrome, status post injection last visit.   PLAN:  1. Will have her set up for a repeat epidural injection by Dr.      Wynn Banker. This is already set up for December 21, 2006, next month.  2. Will obtain hip radiographs of the right hip, rule out hip OA.  3. She does not need a refill on her  medications.   She takes her medications as prescribed and is able to function  independently in her home.           ______________________________  Brantley Stage, M.D.     DMK/MedQ  D:  12/06/2006 11:30:04  T:  12/06/2006 20:21:27  Job #:  [id number]   cc:   Dale [place]  Fax: [phone]

## 2010-06-29 NOTE — Assessment & Plan Note (Signed)
[name] is a very pleasant 77 year old woman who is accompanied by her  daughter to our Pain and Rehabilitative Clinic.  [name] has multiple  pain complaints, which include low back pain specially right leg pain,  which starts in the upper posterior thigh radiating laterally across the  thigh and down the lateral aspect of the right leg to approximately her  knee.  Sometimes it radiates more into the right groin.  This pain  typically occurs when she has been standing for more than 5 minutes and  it will typically alleviate after she sits down.  She does have some  more chronic low back pain, which goes across the low back, however, her  most aggravating pain is this right posterior hip pain, which radiates  down the right thigh laterally, which occurs while standing.   She denies any persistent numbness or tingling.  She does report  weakness in her hip flexor muscles worse on the right than on the left.  She also has some numbness in the right thigh anteriorly.   Her average pain is about 8 especially when she has been standing,  improves with sitting.  Also medications seem to help, injections  occasionally help, and pacing her activity helps.   She is also known to have trochanteric bursitis with iliotibial band  syndrome, which has responded to injections intermittently.   A right hip radiograph done in October 2008, did not show evidence of  any significant joint osteoarthritis at that time.   Her pain is currently managed with Mobic 7.5 mg a day and on occasion,  she does take a Darvocet-N 100, she takes 1 pill every few days and  occasionally if her pain is more severe, she may take a half of a 5 mg  oxycodone IR.  However, she has had 20 pills written out for her in  August and she still has 10 tablets left today on January 21, 2008.   She is independent with self care.  She does have some difficulty  walking.  Denies depression or suicidal ideation.  Denies problems  controlling bowel or bladder.   No changes in medical history since last visit on December 12, 2007.  No  changes in social or family history.   MEDICATIONS:  Currently provided through this clinic include:  1. Mobic 7.5 mg 1 p.o. daily.  2. Lidoderm p.r.n.  3. She has trialed Neurontin in the past, but has been unable to take      it because it makes her too dizzy and it also causes increased foot      and leg cramps.  4. Darvocet-N 100 one p.o. b.i.d. on a p.r.n. basis.  5. Oxycodone IR 5 mg 1 p.o. daily p.r.n. #20 given back on October 15, 2007 with 10 left.   PHYSICAL EXAMINATION:  VITAL SIGNS:  Blood pressure is 132/56, pulse 59,  respirations 18, and 97% saturated on room air.  GENERAL:  She is a well-developed, well-nourished elderly female who  does not appear in any distress.  She is oriented x3.  Speech is clear.  Affect is bright.  She is alert, cooperative, and pleasant.  She follows  commands without any difficulty and answers questions appropriately.   Cranial nerves and coordination are grossly intact.  Reflexes are  diminished in the lower extremities 1+ throughout.  No abnormal tone is  noted.  No clonus is noted.  No tremors are noted.   Sensation  decreased over both anterior thighs, intact below the knee  with pinprick.   Manual muscle testing reveals right hip flexor 3+/5.  Rest of the lower  extremities in the 5/5 range.  Left is 4-/5, the rest of the left lower  extremity is in the 5/5 range.   Gait is stable, slightly antalgic.  Romberg test is performed  adequately, she does have difficulty with tandem gait however.  Straight  leg raise is negative.   Internal and external rotation did not really provoke groin pain.  She  does have little bit of right posterior thigh pain with internal and  external rotation of the right hip.   IMPRESSION:  1. History of lumbar spondylosis/stenosis/spinal stenosis, may likely      be L2 or L3 root involvement with  standing and walking.  2. Bilateral trochanteric bursitis with iliotibial band syndrome,      right worse than left.  3. Bilateral knee osteoarthritis.   PLAN:  [name] does not need any refills on her medications today.  We  did discuss possibly trialing her at some point on Topamax or Lyrica.  However, she is fairly reluctant to add anymore medications to her  current list since she has concerns about dizziness with any new  medicines.   She plans to follow up with orthopedic surgeon, Dr. Chaney Born in  [place] and I will see her back in 6 weeks.           ______________________________  Brantley Stage, M.D.     DMK/MedQ  D:  01/21/2008 12:06:35  T:  01/22/2008 05:41:04  Job #:  [id number]   cc:   Dr. Thurnell Garbe  Fax: [phone]

## 2010-06-29 NOTE — Procedures (Signed)
NAMEJA, PISTOLE                 ACCOUNT NO.:  [id number]   MEDICAL RECORD NO.:  [id number]          PATIENT TYPE:  REC   LOCATION:  [facility]                          FACILITY:  [facility]   PHYSICIAN:  Celene Kras, MD        DATE OF BIRTH:  10-27-1928   DATE OF PROCEDURE:  07/17/2007  DATE OF DISCHARGE:                               OPERATIVE REPORT   PATIENT:  [name].   DATE OF BIRTH:  [birth year].   SURGEON:  Jewel Baize. Stevphen Rochester, M.D.   [name].  I evaluated  her via health and history form and 14-point review of systems, reviewed  available records, progress to date, overall directed care approach.   1. She has extensive spondylitic disease, radicular pain is evident,      and difficulty with endurance and range of motion activities.      Quality of life indices are not to her satisfaction as she is      describing mostly right leg pain, particularly with the pattern      described as L3, L4, some L5 overlay.  She has pain with multiple      range of motion activities.  No bowel or bladder disorder or      overall neurological deficit.  2. Dr. Wynn Banker has evaluate.  I have read her note discussed      treatment limitations and options with the patient.  I think as a      broad brushstroke perspective, it is reasonable to go on to lumbar      epidural and then consider addressing the facet.  RF could be an      option.   OBJECTIVE:  Diffuse paralumbar myofascial pain, side bending pain with  extension.  Fortin test positive.  Modified Gaenslen and Patrick  positive.  Nothing new neurologically.  Straight leg lift is evident,  right greater than left.  She has some modest rotational pain with the  hip, EHL is intact.   IMPRESSION:  1. Degenerative spine disease of the lumbar spine, radicular pain.  2. Spondylosis.   PROCEDURE:  Plan lumbar epidural.  She is consented.   Predicate further intervention based on need and  overall response.  We  will follow up with her in 2 weeks.   The patient was taken to the fluoroscopy suite and placed in the prone  position, back prepped and draped in the usual fashion.  Using a Hustead  needle at the L5-S1 interspace without any evidence of CSF, heme or  paresthesia, test block uneventfully followed by 4 mg of Aristocort and  flush needle.   PLAN:  Tolerated the procedure well.  No complications from our  procedure.  Appropriate recovery and we will see her in follow-up.  No  barrier to communication.           ______________________________  Celene Kras, MD     HH/MEDQ  D:  07/17/2007 13:37:46  T:  07/17/2007 14:48:49  Job:  [id number]

## 2010-06-29 NOTE — Assessment & Plan Note (Signed)
[name] is a pleasant 77 year old married female who is followed in  our [facility] for multiple chronic pain complaints.   PRIMARY CARE PHYSICIAN:  Dr. Dale St. Jacob   She is back in today for refill of her pain medications.   She states over the last few weeks, she has developed some increased low  back pain.  This is her chief complaint.  She does have some lateral leg  pain, however, this is not her main complaint.  Her pain scores overall  are improved from her March visit.  She was averaging 8 on a scale of  10.  Today, she is averaging about 6 on a scale of 10.  General activity  scores are improved as well.   Pain is described as predominately been in the low back.  Interferes  with her standing for any period of time.  She can stand for less than 5  minutes.   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  Improves  with rest, heat, medication, and injections.  She is currently getting  between fair and good relief with current meds.   CURRENT MEDICATIONS:  At this time;  1. Lyrica 25 mg 1 p.o. daily.  2. Darvocet-N 100, not more than 2 tablets per day.  3. Oxycodone IR half tablet to a tablet per day.  She uses not much      more than 1 or 2 tablets per week.  4. Mobic 7.5 mg 1 p.o. daily p.r.n. back pain.  She uses this      regularly.   FUNCTIONAL STATUS:  She is able to walk less than 5 minutes at a time.  She can climb stairs.  She is able to drive.  She is independent with  self care, needs assistance with high-level activities.   REVIEW OF SYSTEMS:  Positive for occasional tingling in back, trouble  walking, anxiety.  Denies problems controlling bowel or bladder.  Denies  depression or anxiety.  Review of systems otherwise negative.   No other changes in past medical, social or family history since last  visit.   Exam; blood pressure  is 130/65, pulse 55, respirations 18, and 97%  saturate on room air.  She is an elderly female who appears her stated  age and does not appear in any distress.  She is oriented x3.  Speech is  clear.  Affect is bright.  She is alert, cooperative, and pleasant.  Follows commands without difficulty.  Answers questions appropriately.   Her cranial nerves are grossly intact.  Coordination is intact.  Reflexes are diminished in the upper and lower extremities and no  abnormal tone is noted.  No clonus is noted.  No tremors are  appreciated.   Sensation is intact as well to light touch in upper and lower  extremities.   Motor strength 5/5 in the lower extremities without focal deficit.   Transitioning from sitting to standing is done slowly.  Gait in the room  is careful.  Shorter stride length is noted.  Normal heel-toe mechanics  are appreciated.  Tandem gait is performed without difficulty and  Romberg test , however, is performed adequately.   She has limitations in lumbar motion in all planes.  Internal and  external rotation at the hips does not aggravate her.  She does have  some bilateral knee pain  with palpation along the medial and lateral  joint line of both knees.  No effusion is appreciated.  No AP or lateral  instability is appreciated.   Palpation through the cervical thoracic and lumbar spine did not yield  any areas of increased sensitivity in the spinous processes.   IMPRESSION:  1. Increased low back pain over the last several weeks without any      history of fall or trauma or increase in activity, although she      does state that she has been able to stand up a bit longer now that      she has been using the Lyrica during functional tasks.  2. Lumbar spondylosis, lumbago.  3. Intermittent trochanteric bursitis.  4. History of bilateral knee osteoarthritis.  5. Lower extremity symptoms in the past suggestive of spinal stenosis      as well.   PLAN:  We will  refill her current medications to include Lyrica 25 mg 1  p.o. daily #30.  Darvocet-N 100 not more than 1-2 tablets per day.  Oxycodone IR 5 mg one half to one tablet p.o. daily for severe pain #20  and Mobic 7.5 mg 1 p.o. daily p.r.n. back pain.   We would like her to consider TENS unit.  We will write a prescription  for this today, may consider medial branch block.  She was given some  information regarding this as well to help manage low back pain.  She  states she is interested in possibly following up with the neurosurgeon  Dr. Maeola Harman.  She got his name from a friend who has had surgery by  him and she is interested.  I asked her to follow up with primary care  doctor for this referral.   We will see her back in a month for pill count and refill of  medications.           ______________________________  Brantley Stage, M.D.     DMK/MedQ  D:  07/02/2008 08:14:59  T:  07/02/2008 21:22:27  Job #:  [id number]   cc:   Dale Dayton  Fax: [phone]

## 2010-06-29 NOTE — Assessment & Plan Note (Signed)
[name] is a very pleasant married 77 year old woman who is  accompanied by her husband of over 50 years to our pain and  rehabilitative clinic.  [name] is back in today, and her chief  complaint is some right lateral hip pain which is worse when she is up  standing.  Her average pain right now is about a 9 on a scale of 10.  She describes it as sharp and aching and constant when she stands;  however, it dissipates a bit when she sits.  In fact, she states she is  pain free while she is sitting.   She reports fair to good relief with the medicines.  She gets fair  sleep.  The pain interferes quite a lot with her activities.  She has  very little ability to walk much more than a few minutes.  She is able  to climb stairs, however slowly, and she is able to drive.  She is  independent with her self-care and needs some assistance with meal prep,  household duties, and shopping.  She admits to some anxiety over being  in pain, but denies any suicidal ideation, denies depression.   No changes in past medical, social, or family history since our last  visit.   MEDICATIONS PRESCRIBED BY OUR CLINIC:  1. Mobic 7.5 mg 1 p.o. daily p.r.n. pain in her back and hip.  2. Neurontin 300 mg at bedtime.  3. Darvocet-N 100, 1 p.o. b.i.d. to daily p.r.n.  4. Lidoderm p.r.n.  5. Oxycodone 1/2 to 1 tablet p.o. daily p.r.n.   PHYSICAL EXAMINATION:  VITAL SIGNS:  Her blood pressure is 151/66, pulse  66, respirations 18, 95% saturated on room air.  GENERAL:  She is a well-developed, well-nourished female who appears her  stated age and does not appear in any distress.  She is oriented x3.  Her speech is clear.  Her affect is bright.  She is alert, cooperative,  and pleasant.  Follows commands without any difficulty.  MUSCULOSKELETAL:  Transitioning from sit to stand is done without much  difficulty.  Her gait in the room is stable.  Tandem gait is somewhat  compromised.  Romberg's test is performed  adequately.  Her stride length  reveals a short stride length but stable.  She has limitations in lumbar  motion in all planes.  She has exquisite tenderness over her right  trochanter today, very tender along the trochanter and the iliotibial  band.  In fact, she jumps when I put moderate pressure in this area.  Her reflexes are 2+ at the patellar tendons, 1+ at the right Achilles, 0  at the left Achilles tendon.  Motor strength is quite good in the lower  extremities, without any focal deficit.  No new sensory deficits are  appreciated.   IMPRESSION:  1. Right painful trochanteric bursitis.  2. Lumbar spondylosis/scoliosis, with intermittent root irritation.   PLAN:  Treatment options were reviewed with [name] with respect to  her left hip, which includes doing nothing, medications, using ice and  heat, stretching exercises, physical therapy, and injections.  She has  had good relief with injections in the past and would like to trial this  again.  Patient consent was signed with the nurse today.  Risks of  injection were reviewed with her, which include bleeding, bruising,  increased pain, nerve injury, numbness, and infection.  She wished to  proceed.  1 mL of Kenalog with 3 mL of 1% lidocaine, using  a 25-gauge  needle, was injected into the right trochanteric region after prepping  with alcohol and Betadine.  She tolerated the procedure well and  reported no problems after the injection.  Band-Aid was placed over the  puncture site.  She was given discharge instructions to ice the area  over the next day and call us if there is any increased redness or heat  from the area, suggesting infection.   We will refill the following medications for her:  1. Mobic 7.5, 1 p.o. daily, #30, 1 refill.  2. Darvocet-N 100, 1 p.o. b.i.d. p.r.n. hip or back pain, #60, no      refills.   We will see her back in a month.  Her last right transforaminal  injections were done on November 23, 2006,  as well as December 21, 2006.  Both of them helped her significantly after the injections.  Last lumbar  x-ray was done May 17, 2006, which showed degenerative changes at L1-2  and L2-3, as well as a lumbar scoliosis.           ______________________________  Brantley Stage, M.D.     DMK/MedQ  D:  01/03/2007 13:24:21  T:  01/04/2007 07:20:49  Job #:  [id number]

## 2010-06-29 NOTE — Assessment & Plan Note (Signed)
[name] is a married pleasant 77 year old female who is being seen in  our pain and rehabilitative clinic for low back pain and leg pain.   She is known to have spondylosis and degenerative changes as well as  intermittent trochanteric bursitis and iliotibial friction band  syndrome.   She is back in today and reports her average pain is about a 7 on a  scale of 10.  She is able to walk just a few minutes.  She will use a  cane for further distances.  Pain is localized to the low back  especially more on the left today along the lateral hip trochanter and  down the iliotibial band.   Pain is worse with walking and standing.  It is relieved with resting,  heat, medication and injection.   FUNCTIONAL STATUS:  He is independent with her self care.  Needs  assistance with high level household tasks.   MEDICATIONS:  Provided by our clinic include the following:  1. Mobic 7.5 mg 1 p.o. q day.  2. Neurontin 300 mg at night, and 1 to 2 tablets during the day.  3. Darvocet N100.  She was given a prescription for 60 tablets in      November of 2008.  She had 43 tablets left.  She takes no more than      3 or 4 tablets per month.  4. Also, Oxycodone for more severe pain.  She was given a prescription      for 30 tablets 10 months ago.  She has used 13 tablets in 10      months, so she is taking 1 or 2 tablets of oxycodone 5 mg 1 per      month.  5. She also uses extra-strength Tylenol on a p.r.n. basis less than      2000 mg per day.  6. She also uses Lidoderm on a p.r.n. basis.   REVIEW OF SYSTEMS:  Noncontributory.  She does have some complaints  about anxiety.  Denies depression or suicidal ideation.  She verbalizes  concern over a sister who was involved in a motor vehicle accident  was  recently hospitalized at [facility].   No change in past medical, social or family history since last visit.   MEDICAL PROBLEMS:  Include the following:  1. History of anemia.  2.  History of colon cancer status post colectomy.  3. Diverticulosis.  4. Hypertension.   On exam today, her blood pressure is 150/56, pulse 67, respirations 18,  94% saturated on room air.  She is a well-developed, well-nourished elderly female who does not  appear in any distress.  She is oriented x3.  Speech is clear.  Affect  is bright.  She is alert, cooperative and pleasant.   She follows commands without difficulty.   Transitioning from sitting to standing is done with ease.  Gait in the  room is stable.  She has some trouble with tandem gait.  Romberg test is  performed adequately.   Limitations are noted in the lumbar range and lumbar motion in all  planes, more discomfort is noted with extension than flexion.   Reflexes are 2+ at the patellar tendon, diminished at the Achilles  tendon.  No sensory deficits are appreciated.  Motor strength is 5/5  without focal deficit in the lower extremities.   Tenderness is noted to palpation over especially the left trochanter  today down the iliotibial band, internal and external rotation of  bilateral hips does not exacerbate her pain in any way.   IMPRESSION:  1. Lumbar spondylosis/spinal stenosis.  2. Bilateral lower extremity lateral hip pain and leg pain      intermittently consistent with trochanteric bursitis.  3. Mild knee osteoarthritis especially in the medial compartment.   PLAN:  Will refill her Mobic today 7.5 mg 1 p.o. q day #30 with 2  refills.  She does not need refills on any of her medication.  She takes  her medications as directed.  She does not display any aberrant  behavior.  She does not drive while she takes her oxycodone.  She is  able to maintain a relatively functional lifestyle despite some  significant impairments in her knee and back.           ______________________________  Brantley Stage, M.D.     DMK/MedQ  D:  04/23/2007 10:49:13  T:  04/23/2007 15:34:36  Job #:  [id number]

## 2010-06-29 NOTE — Procedures (Signed)
[name], [name]                 ACCOUNT NO.:  [id number]   MEDICAL RECORD NO.:  [id number]           PATIENT TYPE:   LOCATION:                                 FACILITY:   PHYSICIAN:  Erick Colace, M.D.   DATE OF BIRTH:   DATE OF PROCEDURE:  12/21/2006  DATE OF DISCHARGE:                               OPERATIVE REPORT   PROCEDURE:  Right L4 transforaminal lumbar epidural steroid injection  under fluoroscopic guidance.   INDICATIONS:  Lumbar radiculopathy unrelieved by medication management,  10/10 pain right lower extremity.  She has had good relief with prior  epidural injection.   DESCRIPTION OF PROCEDURE:  Informed consent was obtained after  describing risks and benefits of the procedure to the patient.  These  include bleeding, bruising, infection, loss of bowel and bladder  function, temporary or permanent paralysis.  She elects to proceed and  has given written consent.  The patient placed prone on fluoroscopy  table.  Betadine prep, sterile drape, 25-gauge 1 and 1/2 inch needle was  used to anesthetize the skin and subcu tissue 1% lidocaine 2.5 mL and 22  gauge 3 and 1/2 inch spinal needle was inserted under fluoroscopic  guidance in the left L4-5 intervertebral foramen.  AP and lateral  imaging utilized.  Omnipaque 180 x 0.5 mL demonstrated good epidural  spread as well as nerve root spread followed by injection 1 mL 40 mg per  mL Depo-Medrol and 2 mL of 1% MPF lidocaine.  Pre and post injection  vitals stable.  Post injection instructions given.  Follow up with Dr.  Pamelia Hoit in approximately 2 weeks.      Erick Colace, M.D.  Electronically Signed     AEK/MEDQ  D:  12/21/2006 10:48:33  T:  12/21/2006 14:21:38  Job:  [id number]

## 2010-06-29 NOTE — Assessment & Plan Note (Signed)
[name] is a pleasant 77 year old woman who is accompanied by her  daughter to our Pain and Rehabilitative Clinic.  She has multiple  chronic pain complaints, which include low back pain, especially right  leg pain more so than left leg pain, which radiates from the buttocks  down to the anterior thigh, 90% of her pain is between the low back,  buttock, and anterior thigh.  She does get occasional radiation into the  lower leg and foot, but this is not the main problem for her.  The pain  in her low back, buttock, and thigh occurs after she has been up just  for a minute or so, limits her ambulation to less than 5 minutes.   She also was known to have trochanteric bursitis and iliotibial band  syndrome, has went through therapy several times to address these  issues, has also had injections into the lateral hip, which have  improved her for a brief period.   She had right hip radiographs done last October 2008.  The right hip  radiograph did not show any evidence of significant osteoarthritis, the  joint space was well maintained, and without significant osteophytic  findings.   Her back pain and leg pain is controlled fairly well with Mobic once a  day 7.5 mg, p.r.n. Lidoderm, and Darvocet-N 100 typically 1 tablet a day  and occasionally up to 2 tablets a day, and for significantly painful  day, she may take 1/2 to 1 tablet of oxycodone IR.  She rarely does  this, however.   Average pain is about an 8 on a scale of 10, interfering significantly  with activities.  Sleep is fair.   Pain is typically worse with walking, bending, and standing.  Improves  with rest, heat, pacing her activities, medication, and injections.  Pain is described as fairly constant, sharp, burning, stabbing,  tingling, and aching in nature.   FUNCTIONAL STATUS:  She is able to walk less than 5 minutes at a time,  has difficulty standing for longer than 5 minutes as well.  She is able  to climb stairs  slowly.  She is able to drive short distances.   She is independent with self-care.  She does need to break her tasks up,  so that she is not standing for any lengthy time.   REVIEW OF SYSTEMS:  Positive for some anxiety, which she describes as  being anxious over pain and inability to do some things.  She denies any  kind of depression or confusion.  Denies suicidal ideation.   Review of systems is otherwise negative.   PAST MEDICAL, SOCIAL, OR FAMILY HISTORY:  Unchanged other than recent  visit with Dr. Darcus Austin, neurosurgeon, who had his PA, Cyndia Bent, see  Ms. Burtt earlier this month on November 20, 2007.  Apparently, some  imaging studies were ordered as well as a referral to orthopedist.  Apparently, orthopedist has also done some radiographs.  I do not have  notes from either of these visits and see there is a pending visit with  Dr. Darcus Austin in the next couple of weeks.  Ms. Greening states that she is  having these other referral sources.   MEDICATIONS:  1. Mobic 7.5 mg 1 p.o. daily p.r.n.  2. Lidoderm p.r.n.  3. Darvocet-N 100 one tablet p.o. daily.  4. Oxycodone IR 5 mg 1/2 to 1 tablet p.o. daily for severe pain.   PHYSICAL EXAMINATION:  VITAL SIGNS:  Blood pressure is 123/56, pulse  71,  respirations 16, and 98% saturate on room air.  GENERAL:  She is a well-developed, well-nourished, elderly female who  appears her stated age and does not appear in any distress.   She is oriented x3.  Speech is clear.  Affect is bright.  She is alert,  cooperative, and pleasant.  She follows commands without any difficulty.   Cranial nerves are grossly intact.  Coordination is intact.  Reflexes  are diminished in the upper and lower extremities.  No abnormal tone is  noted.  No clonus is noted.   Sensation is intact with respect to light touch and motor strength is  5/5 in the distal lower extremities, at dorsiflexors as well as plantar  flexors.  She does have some mild weakness in hip  flexion bilaterally,  but this is somewhat difficult to assess in that flexing her hip does  bother her back.  She may not be able to get full effort for this.  Strength in the hip flexors is in the 4+/5 range.   No tenderness with respect to thoracic or lumbar spine palpation.  She  has limitations in all range of motion.  Internal and external rotation  of the hip is fairly well preserved without pain in the groin or in the  posterior hip during internal or external rotation.  She does complain  of some lateral hip pain during internal and external rotation.  She  does have significant tenderness to palpation along the lateral  trochanter region and down the iliotibial band.   Straight leg raise is negative.   Gait is stable tandem gait, she states she has some mild difficulty, but  Romberg test is, however, performed adequately.   IMPRESSION:  1. History of lumbar spondylosis/stenosis/spinal stenosis.  2. Bilateral trochanteric bursitis with iliotibial band syndrome,      right worse than left.  3. Bilateral knee osteoarthritis.   PLAN:  She has now completed physical therapy program to work on  strength deficits as well as balance.  She has a home program, which she  may continue to work on now that she is finished with therapy.   She has visit set up with neurosurgeon, Dr. Darcus Austin, in the next couple  of weeks.  We will await any recommendations per Dr. Darcus Austin.   Ms. Kittrell does not need any medications today.   She takes her medications as prescribed.  She certainly does not overuse  her medications.  She has never exhibited any aberrant behavior with  their use and is able to maintain a relatively functional lifestyle  despite significant functional deficits with respect to mobility and  pain.  We will see her back in 6 weeks.           ______________________________  Brantley Stage, M.D.    DMK/MedQ  D:  12/12/2007 12:32:09  T:  12/13/2007 00:00:08  Job #:   [id number]   cc:   Dr. Marylee Floras  Fax: [phone]

## 2010-06-29 NOTE — Assessment & Plan Note (Signed)
[name] is a 77 year old woman who is accompanied by her  daughter.  She is back in today for refill of medications and brief  recheck.  She states over the holidays in December, she had been  hospitalized for few days at [facility] for  intestinal obstruction secondary to adhesions.  At that time, she was  also noted to have a cyst on her left kidney, which apparently may have  been picked up by CT scan; however, I do not have notes from this  particular hospitalization.   She reports she has a followup appointment with the urologist on  March 24, 2008.  She has also seen an orthopedist Dr. Ernest [name].  He felt  that she has bilateral lower extremity radicular symptoms secondary to  lumbar degenerative disk disease.  He saw her on February 12, 2008.  There was some question whether or not some of her hip pain was related  to osteoarthritis of the hip.  However, Dr. Ernest [name] feels that it is  indeed coming from her radiating pain from the back.   Average pain is about 8 on a scale of 10.  Pain is located in the lumbar  region radiates down laterally both legs, sometimes more in the groin,  interfering significantly with activity.  She is able to be up walking  not more than a few minutes, has difficulty climbing stairs, but she can  climb stairs, however, slowly.  She does drive short distances as well.   FUNCTIONAL STATUS:  Regarding ADLs is as follows; she is independent  with feeding, dressing, bathing, and toileting, and needs some  assistance with meal prep and household duties.   REVIEW OF SYSTEMS:  Remarkable for trouble walking and anxiety.   Denies depression or suicidal ideation.   PAST MEDICAL, SOCIAL, AND FAMILY HISTORY:  Otherwise unchanged, other  than that noted in history of present illness.   Medications which are provided through this clinic include Mobic 7.5 one  p.o. daily and Darvocet-N 100 up to 2 tablets per day.  She has 37  tablets left from previous prescription, oxycodone 5 mg immediate  release #20 per month.  She has 7 tablets left from last month.   PHYSICAL EXAMINATION:  Today, blood pressure is 160/53, pulse 64,  respirations 18, 98% saturated on room air.  She is well-developed, well-  nourished elderly female who does not appear in any distress.   She is oriented x3.  Speech is clear.  Affect is bright.  She is alert,  cooperative, pleasant.  She follows commands without any difficulty  answers all questions appropriately.   Cranial nerves and coordination are intact.  Reflexes are diminished in  lower extremities.  Sensation is slightly diminished in bilateral lower  extremities as well.   Motor strength is 5/5 in the upper extremities.  She does have some left  hip flexor strength 4/5 compared to the right which is 5/5.  Distal  extremity strength is in the 5/5 range without focal deficits.   She is able to transition from sitting to standing.  Gait is stable.  She has a short stride length.  Romberg test and tandem gait are  performed adequately.   IMPRESSION:  1. History of lumbar spondylosis/stenosis/spinal stenosis with      symptoms of spinal stenosis limited ambulation capacity.  2. Intermittent bilateral trochanteric bursitis.  3. Bilateral knee osteoarthritis.   [name] has trialed Neurontin in the past.  She had noted  some foot  and leg cramps when she took it.  She was unable to tolerate the  Neurontin.  She would like to try Lyrica to see if we can improve her  lower extremity radiating discomfort pain down the left leg when she is  standing with Lyrica.  We will trial her on 25 mg one p.o. daily #30.  I  have reviewed the benefit as well as risks of this medication including  the lower extremity edema, dizziness, gait instability, limb swelling.   She understands these risks as does her daughter.  They would like to  trial this.  We will start a low-dose 25 mg one p.o.  daily, may consider  increasing, if she tolerates this.  I have asked her to discontinue it  if she has any untoward effects.   We will also refill her oxycodone IR 5 mg one p.o. daily p.r.n. back  pain #20.  She typically does not use this medication on a daily basis.  She does not need a refill on her Darvocet-N 100, which she also uses  very sparingly.  We will see her back in a month.           ______________________________  Brantley Stage, M.D.     DMK/MedQ  D:  03/07/2008 08:47:29  T:  03/07/2008 20:41:31  Job #:  [id number]

## 2010-06-29 NOTE — Assessment & Plan Note (Signed)
HISTORY:  [name] is a very pleasant married 77 year old woman who is  followed in our [facility] for low back pain and leg  pain.  She is known to have spondylosis and degenerative changes, as  well as intermittent trochanteric bursitis and ileal tibial band  syndrome.  She is back in today.  She states her average pain is about  an 8/10, localized to the low back and down her lateral hips, to about  the mid-thigh.  She reports her sleep as fair.  She reports good relief  with the current medications that she is prescribed.   MEDICATIONS FROM OUR CLINIC:  1. Mobic 7.5 mg, one p.o. daily.  2. Neurontin 300 mg at night.  3. Darvocet p.r.n.  She was given a prescription for #60 tab back in      November 2008.  She has #35 left yet.  4. For more severe pain she takes oxycodone.  She was given a      prescription for #30 tab 11 months ago and she has used a total of      13 tab.  5. Extra Strength Tylenol on a p.r.n. basis, less than 2000 mg daily.  6. Lidoderm patch p.r.n.  She reports good pain relief with the      current medications that she is on.   She is able to walk about two or three minutes, but does have pain.  She  is able to drive and climb stairs very slowly.  She is independent with  her self care.  Needs some assistance with higher level activity such as  meal prep and household duties.  She admits to some anxiety.  Denies  depression or suicidal ideations.  Denies problems controlling her  bowels or bladder.   REVIEW OF SYSTEMS:  Noncontributory.  She reports she has been healthy  in the last month.  No medical problems.   PAST MEDICAL HISTORY:  1. Positive for a history of anemia.  2. Colon cancer, status post colectomy.  3. Diverticulosis.  4. Hypertension.   SOCIAL/FAMILY HISTORY:  Unchanged from previous visits.   PHYSICAL EXAMINATION:  VITAL SIGNS:  Blood pressure 115/78, pulse 64,  respirations 18, 97% saturation on room air.  GENERAL:   Well-developed and well-nourished elderly female, who does not  appear in any distress.  NEUROLOGIC:  She is oriented x3.  Her speech is clear.  Her affect is  bright.  She is alert, cooperative and pleasant.  She follows commands  without any problems.  She transitions from sitting to  standing slowly.  Her gait in the room displays a short stride length, slightly antalgic  with the first few steps.  She has limitations in lumbar motion which  are quite significant in all planes.  Reflexes are diminished in the  lower extremities.  Tone is normal.  Her motor strength is good without  focal weakness.  EXTREMITIES:  She has fairly significant tenderness over the right  trochanter today and down the ileal tibial band.   IMPRESSION:  1. Lumbar spondylosis/spinal stenosis.  2. Bilateral trochanteric bursitis.  3. Mild knee osteoarthritis, especially in the medial compartment.   PLAN:  We will refill her Mobic today.  She brought the Mobic  prescription in from last month and will void it and give her a new  prescription today.  She will follow back up in our clinic in two  months.  She has been stable on the above medications.  She does not  display any aberrant behavior with the use of her medications.  She is  able to remain rather functional despite some significant impairment.   FOLLOWUP:  I will see her back in two months.           ______________________________  Brantley Stage, M.D.     DMK/MedQ  D:  05/21/2007 11:55:39  T:  05/21/2007 12:12:42  Job #:  [id number]

## 2010-06-29 NOTE — Assessment & Plan Note (Signed)
HISTORY OF PRESENT ILLNESS:  [name] is a 77 year old married female  who is being seen in our [facility] for chronic low  back pain.  She has lumbar spondylosis and a scoliosis with the apex at  L2 in the lumbar spine, a history of multilevel lumbar stenosis as well.   She is back in today and reports her average pain being about a 7/10,  localized in the low back, radiates down mostly the left leg, worse with  prolonged standing or walking.   Improves with rest and heat, medications, injections.  She paces herself  as well.  Uses Lidoderm patch.   She reports good relief with the medications she is prescribed.   Last month she was trialed on 2.5 mg of oxycodone rather than Darvocet N-  100.  She was not sure it was as effective as the Darvocet N-100 which  she had been on the month before.  She has limited abilities with  respect to walking.  She is able to walk less than a minute at a time.  She is able to climb stairs, but slowly.  She is able to drive.  She is  independent with her self care and needs assistance with higher level  household duties.   Denies problems controlling bowel or bladder.  Admits to some anxiety  due to her pain.   PAST MEDICAL/SOCIAL HISTORY:  Unchanged since last visit.   MEDICATIONS PRESCRIBED BY THIS CLINIC:  1. Mobic 7.5 daily.  2. Neurontin 300 mg q.h.s.  3. Darvocet N-100 had been discontinued for now.  4. Lidoderm 5% patch q.12 h p.r.n.  5. Percocet 2.5/325 one p.o. daily.   She brings in her Percocet.  She has used a total of 11 pills over the  last month.  She has 19 left.   She does use p.r.n. Tylenol as well.  Maintaining her dose under 2000 mg  per day.   PHYSICAL EXAMINATION:  VITAL SIGNS:  Blood pressure 144/76, pulse 67,  respirations 16, 98% saturated on room air.  She is well-developed, well-  nourished, elderly female who appears her stated age.  She is oriented  x3.  Affect is bright, alert,  cooperative, pleasant.  Speech is clear.  Follows commands without difficulty.  Transitions from sit to stand  slowly, but without any obvious pain behaviors.  Her gait in the room is  nonantalgic.  Her balance is quite good.  Tandem, gait, Romberg's test  are performed adequately.   Limitations in lumbar motion are noted with forward flexion.  She is  noted to have a prominent right paraspinal musculature as she bends  forward consistent with her scoliosis.   Reflexes are diminished in the lower extremities.  She does, however,  have good strength and normal sensory perceptions with light touch.   IMPRESSION:  1. Lumbar spondylosis.  2. Lumbar scoliosis, apex noted at L2, convexity to the left of L2.  3. History of multilevel lumbar stenosis with diminished ambulation      capacity.  4. Intermittent left lower extremity neuropathic pain.  5. Left trochanteric bursitis/iliotibial band syndrome.   PLAN:  Will trial her on oxycodone 5 mg one p.o. daily #30.  She will  continue to use her Mobic 7.5 daily.  Again, I go over the risks as well  as benefits of this medication.  She would like to continue its use.  She will continue to use Neurontin in the evening.  She has 100  mg  tablets left over from earlier this year.  She is using three at a time  until they run out, then we will prescribe Neurontin 300 mg q.h.s. again  for her.  She does not need any refills on Lidoderm today.  We will see  her back in two months.  She has been stable on these medications.  She  does not exhibit any abhorrent behavior or misuse of the medication.  She is able to maintain her functional status, although, it is limited  due to her lumbar stenosis.  She has been stable.  She did mention she  has had some intermittent dizziness.  I asked her to follow back up with  her primary care physician regarding this as there are many reasons for  intermittent dizziness which she should discuss with her primary  care  doctor.           ______________________________  Brantley Stage, M.D.     DMK/MedQ  D:  07/17/2006 14:10:21  T:  07/17/2006 16:54:15  Job #:  [id number]

## 2010-06-29 NOTE — Assessment & Plan Note (Signed)
[name] is a 77 year old married female who has been seen in our pain  and rehabilitation clinic for chronic low back pain.  She has lumbar  spondylosis and scoliosis with the apex at L2.  No lumbar spine.  She  also has a history of multilevel lumbar stenosis and intermittent  trochanteric bursitis.   She was last seen by me on July 17, 2006.  Earlier in the spring she had  undergone an epidural steroid injection which significantly helped her  pain, as well as left trochanteric bursitis injection in early March  which also significantly helped her pain.  She is back in today  requesting another hip injection.  She has done quite well over the last  few months.  The last few weeks she has been rather active and has not  required use of any narcotic medication.   She states that she has difficulty sleeping at night.  Pain is located  in the lateral aspect of her left hip.  It is sharp and it radiates just  proximal to the knee.  Occasionally, she does get some left knee  discomfort as well.  Pain is not terribly bothersome during the day.  Usually, when she lies on her hips at night is when she is bothered.   Her average pain is about a 7 on a scale of 10, described as aching and  intermittent and sharp in nature.  Her pain can be worse with walking  and standing.  Typically, it is worse at night, however, improves with  rest, heat, ice, medication, and injections.   She reports good relief with the current meds that she is prescribed  through this clinic.   Medications from this clinic include Mobic 7.5 q day, Neurontin 300 mg q  h.s., Percocet 5 mg 1 p.o. q day,   She has significantly limited walking ability at this point.  She is  able to climb stairs.  She is able to drive.  She is independent with  her self care.  Needs assistant with meal prep, household duties and  shopping.  Admits to some anxiety.  Denies depression or suicidal  ideation.   Reports no new problems on  her health and history form regarding the  review of systems.  Past medical, social, family history are otherwise  unchanged since last visit.   On exam today her blood pressure is 131/45, pulse 79, respirations 18,  95% saturation on room air.  She is a well-developed, well-nourished  elderly female who appears her stated age and does not appear in any  distress.  She is oriented times 3.  Her speech is clear.  Her affect is  bright.  She is alert, cooperative and pleasant.  She follows commands  without difficulty.   She is able to transition from sitting to standing quite easily.  Her  gait in the room is stable tandem gait.  Romberg's test are performed  adequately.  She has limitations in lumbar motion in all planes.  Seated. reflexes are diminished, patellar and Achilles tendons.  She has  intact sensation in her lower extremities to light touch.  Motor  strength is quite good.  Hip flex __________  EHL.   Examination of her trochanters reveals exquisite tenderness over the  left trochanter and down the ileal to the band just proximal to the  knee.  Right hip is mildly tender, but not exquisitely so as the left  side.  Internal and external rotation of  her hips did not increase her  pain.  She has no tenderness with palpitation down the cervical,  thoracic or lumbar spine.   She does have a few areas of tenderness in the quadratus lumborum on the  left however.   IMPRESSION:  1. Lumbar spondylosis.  2. Lumbar scoliosis apex at L2.  3. History of multilevel lumbar stenosis with diminished ambulation      capacity.  4. Exacerbation of left trochanteric bursitis/iliotibial band      syndrome.   PLAN:  Reviewed treatment options with respect to her left hip with Ms.  Ell today, including doing nothing, medications, continuing to use ice  and heat and stretching exercises along the iliotibial band, medications  and physical therapy or injections.   She reports she has  gotten quite good relief with injections in the past  and she would like to pursue this.  I reviewed the risks of such an  injection with her including bleeding, bruising, increased pain, nerve  injury, numbness, infection.   She would like to proceed with the injection, 1 cc of Kenalog and 30  cc's of 1% lidocaine were injected into the left trochanteric region  after prepping with alcohol, Betadyne, and using sterile technique.  A  25 gauge needle was used.  She tolerated the procedure well and reported  overall improvement after the injection.   Post-injection instructions were given to her and a band-aid was placed  over the puncture site.  She was given a prescription for Mobic 7.5 mg 1  p.o. q day p.r.n. pain, #30 with 2 refills.  I also reviewed the risks  and benefits of using Mobic long-term with her.  She does understand  that there are risks including cardiovascular and gastrointestinal, as  well as risk of edema.  She would like to continue using Mobic despite  some of these risks.  She feels that her function is much better while  she is on it.  We will see her back in 2 months.  She has been stable on  medications.  She uses them appropriately.  She does not exhibit any  aberrant behavior, and she is able to maintain a functional lifestyle  despite multiple impairments.           ______________________________  Brantley Stage, M.D.     DMK/MedQ  D:  10/11/2006 14:20:30  T:  10/12/2006 09:55:04  Job #:  [id number]

## 2010-06-29 NOTE — Assessment & Plan Note (Signed)
[name] is a pleasant 77 year old woman who is accompanied by her  husband today.  She is back in today for brief recheck and a refill of  her medications.   She was last seen by me on March 07, 2008.   In the interim, she has done well over the last month.  She states that  she has had some of her bad days ever.  She was recently started on a  Lyrica last month 25 mg 1 p.o. daily.  She states that couple of days  ago, she was up most of the day.  She made 2 chocolate pies and 1 pound  cake.  She has gone out to eat with some friends as well.  She is able  to walk between 5 and 10 minutes at a time, which is increased from  previous visits.   Average pain is about 8 on a scale of 10, predominately localized in the  low back radiating down bilateral lower extremities.   Sleep is fair.  Pain is worse typically when she is up walking or  standing for a prolonged period of time and improves with rest, heat,  pacing her activities, medications.  Injections help as well.   She reports good relief with current meds.   MEDICATIONS PRESCRIBED THROUGH THIS CLINIC:  1. Mobic 7.5 one p.o. daily p.r.n. back pain.  2. Darvocet-N 100 one p.o. q.12 h. p.r.n.  She used 2 tablets in the      last month.  3. Oxycodone IR 1 tablet p.o. daily.  4. Lyrica 25 mg 1 p.o. daily as well.   With respect to the above medications, she reports no problems with  oversedation or constipation.  She is not sure if with the addition of  Lyrica whether she is a little more unstable when she first stands;  however, she is not real sure about this.  Her husband believes she  might be a little bit more unstable upon initially standing, but he has  noticed that her overall function has improved significantly.   FUNCTIONAL STATUS:  The patient is able to climb stairs.  She is able to  drive.  She walks between 5 and 10 minutes at a time.  She does use a  cane.  She is independent with self-care.   Denies problems  controlling bowel or bladder.  Admits to occasional  tingling in lower extremities, trouble walking, anxiety.  Denies  depression or suicidal ideation.   No other change in past medical, social, or family history since last  visit.  She has a urologist who does see her, his name is [employer].   The exam today, blood pressure is 127/53 sitting, 142/69 standing.  Her  pulse is 67, respirations 18, 95% saturated on room air.   She is well-developed, well-nourished elderly female who does not appear  in any distress.  She is oriented x3.  Speech is clear.  Affect is  bright.  She is alert, cooperative, and pleasant.  She follows commands  without any difficulty and answers questions appropriately.   Cranial nerves and coordination are grossly intact.  Reflexes are 2+ in  the upper extremities diminished at the right patellar tendon and the  left Achilles tendon, 2+ in the left Achilles tendon and the right  Achilles tendon.   No abnormal tone is noted.  No clonus is noted.  No tremors are  appreciated.   Her motor strength is 5/5 in both upper  and lower extremities without  focal deficits.  Other than the right hip flexor is about half a grade  weaker than the left hip flexor.   She has been able to transition relatively easy from sitting to  standing.  Her gait is stable.  She has difficulty with tandem gait.  Romberg test is performed adequately, mild amount of sway, but is able  to maintain standing.  She has a shorter stride length and slightly wide-  based gait.   IMPRESSION:  1. History of lumbar spondylosis/stenosis/spinal stenosis with      symptoms of spinal stenosis and limited ambulation capacity.      Overall, improved over the last month.  2. Intermittent bilateral trochanteric bursitis.  3. Bilateral knee osteoarthritis.   PLAN:  We will refill her Mobic 7.5 mg 1 p.o. daily p.r.n. back or leg  pain #30.  She does not need a refill on her Lyrica.  She is  taking 25  mg 1 p.o. daily without significant side effects.  She takes Darvocet-N  100 very sparingly.  She has used 2 tablets in the last month and she  does use an occasional oxycodone IR 5 mg not more than a couple per  month as well.  We will see her back in 1 month and we will make a  determination whether to continue her Lyrica.  At this point, she finds  it to be beneficial, benefits outweighing the risk at this time.           ______________________________  Brantley Stage, M.D.     DMK/MedQ  D:  04/07/2008 11:51:17  T:  04/08/2008 00:39:56  Job #:  [id number]

## 2010-06-29 NOTE — Assessment & Plan Note (Signed)
[name] is a 77 year old married woman who has followed in our [facility] for multiple chronic pain complaints.  Her  predominant problem stems from spondylotic lumbar spine.  She has  symptoms of spinal stenosis and limited ambulation capacity, pain  radiating from her low back around her hips laterally to her lateral  thighs, sometimes more anterior thigh.   She has had previous hip x-rays and is not felt to have osteoarthritis  of the hips.   She had been placed on Lyrica over the last couple of months and has  noted an improvement in overall ability to stand up for a longer period  of time.  She states on occasion she feels the Lyrica may make her a  little bit dizzier, but for the most part, she finds that it is quite  helpful in allowing her to stand up for a longer time.  She is able to  peel potatoes and stand at the counter and engage in social activities a  little bit better.   She is using a cane for stability at this time.  She does have pain  medications which she takes very sparingly.  For mild pain, she takes  Darvocet-N 100 and in the last month, she has taken 2 tablets.  For  moderate-to-more severe pain, she takes oxycodone IR and in the last  month, she has taken 5 tablets.   She also uses Lidoderm on a p.r.n. basis and again uses this rather  sparingly.   She indicates that her average pain is about an 8 on a scale of 10.  However, she gets very good relief with current medications.  Pain is  described as sharp, burning, and tingling in nature, worse with walking  and standing, improves with pacing her activities, heat, rest,  medication, and injections.   Functional status.  She does use a cane for ambulation.  She is able to  climb stairs slowly.  She is able to drive short distances.  She is  independent with feeding, dressing, bathing and toileting, and can do  some meal prep as well.   She denies problems controlling bowel or  bladder.  Reports rare  dizziness.  Denies depression, confusion, anxiety or suicidal ideation.  Does admit to some trouble walking due to the leg pain.   Review of systems otherwise negative.  Past medical, social, and family  history unchanged from previous visits.   Exam today; blood pressure is 127/72, pulse 64, respiration 18, and 98%  saturated on room air.  She is a well-developed, well-nourished woman,  elderly female, who does not appear in any distress.   She is oriented x3.  Speech is clear.  Affect is bright.  She is alert,  cooperative, and pleasant.  Follows commands without difficulty and  answers questions appropriately.   Her coordination is intact.  Reflexes are diminished in the lower  extremities.  No abnormal tone is noted.  No clonus is noted.  No  tremors are appreciated.   She has normal muscle bulk in the upper and lower extremities.  Sensory  exam is unchanged.  Manual muscle testing reveals 5/5 strength at hip  flexors bilaterally, 5/5 at knee extensors, ankle dorsiflexors, and  plantar flexors as well.   No pain with internal or external rotation of either hip joint.   Gait is assessed.  She is stable ambulating without her cane in the  room.  She has difficulty with tandem gait, but  is able to perform a  Romberg test without difficulty.   IMPRESSION:  1. Neuropathic lower extremity pain related to spinal stenosis.  2. Lumbar spondylosis with lumbago.  3. Intermittent trochanteric bursitis.  4. History of bilateral knee osteoarthritis.   PLAN:  A 25-minute discussion with Ms. Lawley today regarding her  medications.  She finds Mobic to be extremely helpful in managing her  pain, as she has been on this for quite a while.  She takes her Darvocet-  N 100 very sparingly.  She has not used, but 2 tablets in the last  month.  She has used a total of 5 oxycodone IR tablets in the last month  for more moderate pain.  She finds the Lyrica has been helpful  in  helping her stand up or walk for a longer period of time.  She is not  sure whether it slightly increases her instability or not.  She will  assess this further over the next month.  We will give her another  prescription 25 mg not more than 1 time per day.  Over the next couple  of months, she expresses an interest in possibly trying Ultracet.  She  has been on Darvocet-N 100 for a very long time and is somewhat  reluctant at this point to try anything new, but she will think about  it.  We will see her back in 2 months.   Ms. Costley uses her narcotic pain medication responsibly.  She does not  exhibit any aberrant behavior with its use and she reports very good  relief when she does use it.           ______________________________  Brantley Stage, M.D.     DMK/MedQ  D:  05/07/2008 13:45:11  T:  05/08/2008 02:14:39  Job #:  [id number]   cc:   Dr. Lorin Picket

## 2010-06-29 NOTE — Assessment & Plan Note (Signed)
[name] is a very pleasant, married 77 year old woman who is followed  in our [facility] for chronic low back pain and  bilateral foot pain.   She has a history of lumbar spondylosis and lumbar spinal stenosis.  The  apex has a curve at L2-3, convexed to the left.  She also has  intermittent problems with bilateral trochanteric bursitis and bilateral  knee osteoarthritis.   She has recently undergone two epidural steroid injections per Dr.  Stevphen Rochester, last one done was on August 07, 2007.  She reports that the  initial injection which was done on July 17, 2007, gave her the most  relief and she believes that she may have been experiencing some relief  from the first injection when the second injection was performed several  weeks later.   She states her average pain is about an 8 on a scale of 10, localized  especially in the right posterior hip region down the right leg and  around the thigh anteriorly.  The pain comes and goes, seems to get  worse with activity, improves with rest and heat and pacing her  activities.  Medication also helps and the injections seemed to help  too.  She reports fair-to-good relief with current pain medication.   MEDICATIONS:  Prescribed through this clinic include:  1. Mobic 7.5 mg per day.  2. Darvocet-N 100 up to twice a day.  3. Lidoderm on a p.r.n. basis.  4. Oxycodone 5 mg up to 1 tablet per day.  5. Neurontin 300 mg 1 p.o. at bedtime, which she has discontinued last      month due to leg cramps.   FUNCTIONAL STATUS:  She is able to walk very little at a time.  She does  use a cane.  She is a household ambulator basically.  She is able to  climb stairs slowly.  She does drive for short distances.  She is  independent with self-care, needs assistance with heavier household  tasks.  Denies problems controlling bowel or bladder.  Admits to some  anxiety over pain.  Denies depression or suicidal ideation.   REVIEW OF SYSTEMS:   Otherwise, noncontributory.   Past medical, social, and family history unchanged since her last visit.   PHYSICAL EXAMINATION:  VITAL SIGNS:  Today, blood pressure is 126/54,  pulse 67, respiration 18, and 98% saturated on room air.  GENERAL:  She is a well-developed, well-nourished elderly female who  appears her stated age.  She is oriented x3.  Her speech is clear.  Her  affect is bright.  She is alert, cooperative, and pleasant.  She follows  commands without any difficulty.  She stands with a mild thoracic  kyphosis, flattening of the lumbar lordosis.  She has full extension at  bilateral knees and she has minimal limitations with motion in bilateral  hips without pain.   Mild tenderness in the lumbar paraspinal muscles is noted especially on  the right.  She had tenderness along the knee, medial as well as lateral  joint line and up to the iliotibial band bilaterally and over the  trochanteric bursa is also tendered.   Her strength overall is quite good in the lower extremities.  No sensory  deficits are appreciated.  Reflexes are overall diminished at patellar  and Achilles tendons.  No abnormal tone is noted.   She has some deficits in her balance.   PLAN:  She has discontinued her Neurontin due to lower extremity  cramping.  We will not restart it today.  We will refill the following  medications:  1. Oxycodone IR 5 mg 1 p.o. daily on a p.r.n. basis, #20.  2. Darvocet-N 100 one p.o. q.12 h. p.r.n. back or hip pain, #60.  3. Mobic 7.5 one p.o. daily p.r.n. back and leg pain, #30.  4. Lidoderm patch 5%, 12 hours on and 12 hours off, #90.   We will also recommend she get started in physical therapy program to  address the lower extremity strength deficits and balance.  May discuss  her case further with her primary care, Dr. Dale [place].  We will see  her back in a month.           ______________________________  [name], M.D.     DMK/MedQ  D:   10/15/2007 12:25:11  T:  10/16/2007 03:15:43  Job #:  [id number]

## 2010-07-02 NOTE — Assessment & Plan Note (Signed)
[name] is a 77 year old married female who is being seen in our [facility] for chronic low back pain, bilateral hip pain  which is worse on the left more than on the right.   She has pain when she is up walking.  She states some of it does go all  the way down to the left foot.  She also states that while she is in bed  at night, she has fairly excruciating pain over the left hip trochanter  region.   She is also complaining of some low back pain.   She feels that she has done better after the last epidural steroid  injection which was back in June.  At that time, she had a left L4  transforaminal epidural steroid injection done by Dr. Wynn Banker.   She is requesting to have this repeated possibly next week.   She states her average pain is about an 8.  It is fairly constant in the  back and the legs.  Again, the hip pain is exacerbated with lying down  at night.   She reports fair relief with the current medications that she is  prescribed.  She is taking Mobic 7.5 mg a day, Neurontin 300 mg at night  and up to 1-2 Darvocet each day.   She is complaining of having some difficulty walking for any length of  time at this point as well.   There are no new changes in her past medical, social or family history.   PHYSICAL EXAMINATION:  VITAL SIGNS:  Blood pressure 124/45, pulse 58,  respirations 16, 99% saturation on room air.  GENERAL:  She is a well-developed, well-nourished female who appears her  stated age.  She is oriented x3.  Her affect is bright, alert.  She is  cooperative and pleasant.  Her speech is clear.  She follows commands  without difficulty.  She does not appear in any distress.  She  transitions from sit to stand without any difficulty and independently.  Her gait in the room is slightly shorter stride length.  Her gait is  stable, however.  She is able to tandem walk.  Her Romberg test is  negative.  She has limited lumbar motion in all  planes.  Seated, her  reflexes are 1+ at the patella tendons, 0 at the ankles.  She has  exquisite tenderness over the left trochanter down the iliotibial band  slightly.  She has no pain with internal or external rotation at the  hips to suggest hip/joint pathology.   Her motor strength is good in both lower extremities without any focal  weakness.   IMPRESSION:  1. Left lower extremity neuropathic pain.  2. Lumbar stenosis, multilevel.  3. Left trochanteric bursitis/iliotibial band tendinitis.   PLAN:  We discussed treatment options for the leg pain.  She probably  has multifactorial component to the leg pain that she is experiencing.  There may be a neuropathic component as well as a more structural  component related to the bursitis.  She is already on anti-  inflammatories and is taking Darvocet-N 100.  She is adamant, she does  not like to feel sleepy and is not interested in more narcotics at this  point.  She was given an option of trochanteric bursitis injection.  She  has had one of these in the past and has found them to be quite  beneficial.  She would like to proceed with this therapeutic  intervention.  Risks and benefits regarding trochanteric bursitis  injection were reviewed with her.  She would like to pursue this.  Her  left trochanter was cleaned with Betadine and alcohol.  Using a 2-1/2  inch 25-gauge needle, 1 mL of Kenalog and 3 mL of 1% Lidocaine were  injected without difficulty.  Vapo-coolant spray was used to decrease  skin sensitivity as well.  The patient tolerated the procedure well.  The puncture wound was covered with a Band-Aid and she reported no  problems with gait or increased pain after the injection.  She was given  some post-injection instructions.  She also is interested in being set  up for a transforaminal injection with Dr. Wynn Banker, we will see if  this can be arranged next week.  She was given samples of Celebrex and  her Darvocet-N 100  was refilled #60.  She will be taking it twice a day.           ______________________________  Brantley Stage, M.D.     DMK/MedQ  D:  04/19/2006 13:45:51  T:  04/19/2006 14:22:41  Job #:  [id number]

## 2010-07-02 NOTE — Assessment & Plan Note (Signed)
[name] is a pleasant 77 year old white female who was referred by  Dale [place].   She is being seen in our [facility] for chronic low back  pain symptoms with stenosis.  Also noted to have some left trochanteric  bursitis.   At the last visit, she was started on some Neurontin 100 mg at night, also  given some Lidoderm and asked to keep track of her daily Tylenol usage.   She is back in and reports that the Neurontin did help quite a bit at night  with her pain.  She appears to be using her Tylenol appropriately.  Is  staying well under 2000 mg a day with the use of her Darvocet and Extra  Strength Tylenol.   She reports overall improvement in her condition.  Average pain is about a 5  on a scale of 10.  Pain is described as sharp and aching in nature,  localized low back and slightly in the right leg, more so in the left  lateral leg.  Gets good relief with current medications.  Pain typically  worse with walking, bending, standing.  Improves with rest, heat, pacing her  activities, medication injections.   She can walk a few minutes at a time.  She is able to climb stairs and does  drive.   REVIEW OF SYSTEMS:  Positive for requiring assistance with meal prep,  household duties, shopping.  Also positive for tingling and trouble walking.   No change in the past medical, social, family history since last visit.   PHYSICAL EXAMINATION:  GENERAL APPEARANCE:  She is a well-developed, well-  nourished woman, does not appear in any distress.  Oriented x3.  Affect  bright, alert and cooperative.  VITAL SIGNS:  Blood pressure 146/75, pulse 65, respirations 16, 97%  saturated on room air.  NEUROLOGIC:  Able to stand easily.  Gait is stable.  Normal heel-to-toe  mechanics.  Normal base of support.  Balance is good.   Strength is good in both lower extremities.  Mild tenderness noted over the  trochanter on the left.  Seems to be somewhat better.    IMPRESSION:  1.  Symptoms of spinal stenosis.  2.  Insomnia.  3.  Left trochanteric bursitis.   PLAN:  Refill Neurontin 100 mg one p.o. nightly #30 three refills and  Darvocet-N 100 one p.o. daily p.r.n. back, hip or leg pain #30.  See her  back in a month.  Anticipate she will be able to be followed by nursing  staff over the next couple of months.  She has done well on the Neurontin,  no adverse side effects.           ______________________________  Brantley Stage, M.D.     DMK/MedQ  D:  03/02/2005 13:57:52  T:  03/02/2005 15:04:51  Job #:  [id number]

## 2010-07-02 NOTE — Procedures (Signed)
[name], [name]                 ACCOUNT NO.:  [id number]   MEDICAL RECORD NO.:  [id number]          PATIENT TYPE:  REC   LOCATION:  [facility]                          FACILITY:  [facility]   PHYSICIAN:  Erick Colace, M.D.DATE OF BIRTH:  18-May-1928   DATE OF PROCEDURE:  01/03/2005  DATE OF DISCHARGE:                                 OPERATIVE REPORT   PROCEDURE:  Left hip trochanteric bursa injection.   INDICATIONS FOR PROCEDURE:  Trochanteric bursitis with left hip pain.   Previous injection was left L4 transforaminal epidural steroid injection.  She still has continued benefit with back pain and lower extremity radicular  pain.   Examination today reveals left hip point tenderness over the greater  trochanter.  She has no pain with flexion or extension at the hips.  No  lower extremity numbness.   IMPRESSION:  Left trochanteric bursitis.   PLAN:  Trochanteric bursa injection today.   DESCRIPTION OF PROCEDURE:  Trochanteric bursa injection done on left side  after Betadine prep.  Informed consent was obtained prior to explaining  risks and benefits including bleeding, bruising, and infection.  A 21 gauge  2 inch needle was inserted to a depth of approximately 1-3/4 inches.  Bone  contact made, withdrawn slightly, and then a solution containing 1 mL of 40  mg per mL Depo-Medrol plus 4 mL of 1% lidocaine were injected.  The patient  tolerated the procedure well.  She may resume her Mobic.  She will follow up  with Brantley Stage, M.D. and return to me for repeat L4  transforaminal as her back pain and radicular pain recur.      Erick Colace, M.D.  Electronically Signed     AEK/MEDQ  D:  01/03/2005 11:06:00  T:  01/03/2005 12:03:36  Job:  [id number]

## 2010-07-02 NOTE — Assessment & Plan Note (Signed)
[name] is a 77 year old married female who is being seen in our pain  and rehabilitative clinic for chronic low back pain.   She was last seen by me on May 17, 2006.   She underwent a left L4 transforaminal epidural steroid injection by Dr.  Wynn Banker April 25, 2006.   At the last visit, she had apparently a bone scan which had some concern  about lumbar compression.  Lumbar radiographs were done on May 17, 2006, the results of which were reviewed with her today.  No evidence of  a compression fracture but she was noted to have a lumbar scoliosis with  the apex convex left at L2 with degenerative changes at L1-2 and L2-3  noted.  She is back in today and reports her average pain is about an 8  on a scale of 10.  She does have an area localized to the upper gluteal  area on the left which is a fairly constant, sharp and burning pain for  her, improves with icing.   Her sleep is fair.  She is getting good relief with the medications that  she takes from this clinic.   MEDICATIONS PRESCRIBED BY OUR [facility]:  1. Darvocet N 100 approximately one tablet each day.  2. Mobic 7.5 each day.  3. Lidoderm on a p.r.n. basis.   She has had no changes in her past medical, social or family history  since our last visit.   PHYSICAL EXAMINATION:  GENERAL APPEARANCE:  She is a well-developed,  well-nourished female who appears her stated age.  VITAL SIGNS:  Her blood pressure is 131/70, pulse 56, respirations 16,  98% saturated on room air.  NEUROLOGIC:  Her speech is clear.  She does not appear in any distress.  She is oriented x3.  Her affect is bright, alert, cooperative and  pleasant.   She follows commands without difficulty.   She transitions from sitting to standing quite easily.  Her gait in the  room is stable and nonantalgic.  She has a little bit of difficulty with  tandem gait.  Romberg's test is performed adequately.  Flexion and  extension in the  lumbar spine are not particularly painful but are  somewhat limited for her.  Seated, her reflexes are symmetric and intact  in the lower extremities.  No sensory deficits are appreciated.  Motor  strength is good without focal weakness.   IMPRESSION:  1. Lumbar spondylosis.  2. Lumbar scoliosis apex noted at L2, convexity to the left at L2.  3. History of lumbar stenosis, multilevel.  4. Intermittent left lower extremity neuropathic pain.  5. Left trochanteric bursitis, iliotibial band tendinitis.   PLAN:  [name] brings in her Darvocet prescription from earlier this  month.  She was given Darvocet N 100 one p.o. b.i.d. p.r.n. back pain  #60.  She has not used it from. Her last prescription was given in March  and she was given 60 tablets at that time.  She still has 25 tablets  left.   A long discussion with use of narcotics.  Would like to see her  discontinue her Darvocet N 100.  Will trial her on oxycodone 2.5 mg with  325 mg of acetaminophen.  She would like to trial this as well, up to  one per day on a p.r.n. basis for her low back pain.  We discussed she  may take a 325 mg acetaminophen in addition to bring her up  to 650 mg  when she does take her medication again.  I reiterate to try to stay  under 2000 mg of Tylenol per day.  She understands.   She has trialed Lidoderm and has found it to be of benefit.  She has  gone ahead and had this prescription filled and is using it.  She has  had no problems with any of the other medications prescribed, no  abdominal complaints.  Will see her back in a month.  Will assess at  that time how she is doing on the Percocet.           ______________________________  Brantley Stage, M.D.     DMK/MedQ  D:  06/14/2006 12:14:03  T:  06/14/2006 13:47:56  Job #:  [id number]

## 2010-07-02 NOTE — Assessment & Plan Note (Signed)
Wednesday, February 22, 2006.   [name] is a very pleasant 77 year old married female who is being  seen in our pain and rehab clinic for chronic low back pain and  bilateral hip pain worse on the left more than the right.   She states her average pain is about a 7/10.  Sleep tends to be poor.  However, she states she is going to be checked with a sleep apnea study  in the upcoming week.   Pain is described as burning, sharp, tingling, and aching in nature.  She is getting good relief with the current meds that she is on.  She is  limited in how long she can ambulate.  She is able to climb stairs and  she is able to drive yet.  She denies depression or suicidal ideation.   MEDICATIONS:  She uses from this clinic:  1. Mobic 7.5 mg 1 p.o. daily.  2. Neurontin 300 mg nightly.  3. Darvocet-N 100 up to 2 tablets daily.   EXAM:  Blood pressure 118/44, pulse 66, respirations 16, 98% sat rate on  room air.  She is a well-developed, well-nourished elderly female who appears her  stated age.  She is oriented x3.  Her speech is clear.  Her affect is  bright, alert, cooperative, and pleasant.  She follows commands easily.  Her gait in the room is non-antalgic.  Initially, when she stands up, she seems a little stiff.  However, this  evens out.  She has some limitations in lumbar motion in all planes.  Romberg's test is negative.  However, she does have a little difficult  with tandem gait.  She has good strength in her lower extremities.  Her  reflexes are diminished in the lower extremities.  She has tenderness  over both trochanters and down the iliotibial band.  No new numbness,  tingling, or weakness, however.   IMPRESSION:  1. Left lower extremity neuropathic pain.  2. Lumbar stenosis, multi-level.  3. Left trochanteric bursitis/iliotibial tendinitis.   PLAN:  We will refill her Darvocet-N 100 one p.o. b.i.d. p.r.n. leg pain  #60 no refills.  She is to continue to use Neurontin 300  mg at night, as  well as Mobic on a daily basis.  She does take Protonix, which is  provided by Dr. Mechele Collin.  She is currently stable on these medications  and has not had any problems with over-sedation or constipation.  No  aberrant behaviors are appreciated.  I will see her back in 1 month.           ______________________________  Brantley Stage, M.D.     DMK/MedQ  D:  02/22/2006 15:58:38  T:  02/22/2006 17:45:04  Job #:  [id number]

## 2010-07-02 NOTE — Assessment & Plan Note (Signed)
[name] is back in today for brief recheck.  She is a 77 year old married  white female referred by [name].   She has a long history of low back pain and some left trochanteric bursitis.  She has recently been through some therapy, about two weeks, working on  stretching her IT band and some lower extremity strengthening type  exercises.  She reports these have not helped significantly at this point  and continues to have chronic pain in the left leg especially with prolonged  standing or walking.   Her average pain is about a 7 on a scale of 10.  It is a constant aching.  Pain is with walking, bending and standing.  It improves with rest, heat,  ice, injections and she gets good relief with the current medications she is  using.  She really cannot walk very long at all, just a few minutes.  She is  able to climb stairs and she is able to drive.   She is independent with all of her self-care with the exception of meal  prep, household duties and shopping.  Patient does admit to some anxiety,  denies any other problems in the review of systems.   PHYSICAL EXAMINATION:  GENERAL APPEARANCE:  She is a well-developed, well-  nourished, elderly female.  She is oriented x3.  Her affect is bright, alert  and cooperative, she is pleasant.  VITAL SIGNS:  Blood pressure 140/75, pulse 71, respirations 16, 97%  saturated on room air.  NEUROLOGIC:  She is able to stand and walk in the room without difficulty.  Seated, reflexes are 1+ throughout.  Motor strength is nonfocal.  She has  tenderness with palpation over the left trochanter again.   IMPRESSION:  History is consistent with spinal stenosis with inability to  stand more than a few minutes.  Would like to consider epidural injection to  see if we can improve the leg pain for her.  Would like her to continue her  current exercises for her iliotibial band and trochanteric bursitis, may  consider injection of this area at some point  as well.  Previous MRI scan is  on the chart, dated August 19, 2004, which shows multilevel degenerative disc  disease.   She does not need any refills on her medications today.  She takes one to  two Darvocet each week and she takes 1000 mg of Tylenol twice a day.  Will  have her follow up with Dr. Wynn [name].           ______________________________  Brantley Stage, M.D.     DMK/MedQ  D:  11/08/2004 14:14:19  T:  11/09/2004 13:48:59  Job #:  [id number]

## 2010-07-02 NOTE — Assessment & Plan Note (Signed)
Monday, January 23, 2006:   [name] is a 77 year old female who is being seen in our pain and  rehabilitative clinic for chronic low back pain and bilateral lateral  hip pain.   She states her average pain is about a 7 on a scale of 10, localized  mainly to the left lateral hip region down to the knee.  She also had  some complaints of some medial knee joint pain at night.   She reports fairly good relief with the current meds that she is on.  She takes Mobic, Darvocet, and Neurontin 300 mg at night.  She had been  on Percocet prior, but believes 1 or 2 Darvocet a day are fairly  successful in treating her pain.   She is able to climb stairs, she drives, she is independent with her  self care.  She is limited in how long she can ambulate.  There are no  other changes in her past medical, social, family history other than she  has been started on some new medications by her doctor; __________, as  well as Lisinopril for her blood pressure.   Medications from this clinic include:  1. Darvocet-N 100 one to two p.o. daily.  2. Neurontin 300 mg at night.  3. Mobic 7.5 mg per day.   PHYSICAL EXAMINATION:  Blood pressure 118/67, pulse 70, respirations 16,  96% saturated on room air.  She is well developed, well nourished, elderly female, who appears her  stated age.  She is oriented x3.  Her affect is bright, alert,  cooperative and pleasant.  She does not appear in any distress.  Her  speech is clear.  She follows commands without difficulty.  She transitions from sit to stand without any problems.  Her gait in the  room is stable.  Romberg's test is negative.  She has just a little  trouble with tandem gait.  Seated reflexes are symmetric and intact in  the lower extremities.  Motor strength is good throughout both lower  extremities.  No focal sensory deficits are appreciated.  She has quite  a bit of tenderness along the iliotibial band on the left, as well as  over the  trochanter mildly and at the distal insertion of the iliotibial  band on the left.   IMPRESSION:  1. Left lower extremity neuropathic pain.  2. Lumbar stenosis, multilevel.  3. Left trochanteric bursitis/iliotibial tendinitis.   PLAN:  Refill the following medications:  1. For her Darvocet-N 100 one p.o. daily to b.i.d. p.r.n. back or leg      pain, #60, no refills.  2. Neurontin 300 mg 1 p.o. nightly, #30, 3 refills.  3. Mobic 7.5 mg 1 p.o. q. day, #30, 2 refills.   We discussed getting her involved in a physical therapy program to  address her iliotibial band tightness and pain.   At this point, she does not feel the need to pursue therapy.  She feels  she is doing quite well with her current function and pain level.  She  would just like her medications refilled at this point.  Will reassess  her in 1 month.  She  has been stable on these medications.  No aberrant behavior has been  appreciated.  She takes her medications as prescribed.           ______________________________  Brantley Stage, M.D.     DMK/MedQ  D:  01/23/2006 14:42:42  T:  01/23/2006 23:13:15  Job #:  [id number] 

## 2010-07-02 NOTE — Assessment & Plan Note (Signed)
INTERVAL HISTORY:  [name] is an extremely pleasant 77 year old  female who is a patient of Dr. Dale Turnerville.   She was last seen in our pain and rehabilitative clinic for chronic low back  pain and symptoms of stenosis, also has been treated for left trochanteric  bursitis.   She had her left hip injected by Dr. Wynn Banker in November 2006 with  significant improvement in her pain.   She is back in today.  Her chief complaint again is left lateral hip pain  which radiates to about approximately the knee sometimes going down into the  foot.  Average pain is about a 9 on a scale of 10 which is significantly  worse than the last visit which the range was in the 4/5 range.  Sleep has  been poor.  She gets fair relief from the meds she is on.  Pain is fairly  constant, worse with activity in the left hip.  She has difficulty walking  at this point.  She is able to climb stairs.  She is able to drive.   Independent with all of her self-care.  Needs some assistance with shopping  and household duties.   REVIEW OF SYSTEMS:  Positive for trouble walking and anxiety secondary to  pain.   PAST MEDICAL/SOCIAL/FAMILY HISTORY:  Unchanged since our last visit.   EXAMINATION:  Blood pressure 134/57, pulse 65, respirations 16, 96%  saturated on room air.  She is a well-developed, well-nourished female who  appears her stated age.  She does not appear in any distress.  She is  oriented x3.  Affect is bright, alert, cooperative and pleasant.  She is  able to stand up after being seated.  She appears to be getting up somewhat  slowly today.  Her gait is definitely antalgic in the room, decreased  weightbearing over the left lower extremity, appears to be quite a painful  gait today.  She has symmetric reflexes at the patellar tendons and Achilles  tendons.  No abnormal tone is noted in the lower extremities.  She has good  strength throughout both lower extremities.  Straight leg raise is  negative.  Sensation appears to be intact.   She has exquisite tenderness over the trochanter and down the iliotibial  band, very tender today over the lateral hip.   IMPRESSION:  1.  Recurrence of left trochanteric bursitis.  2.  Symptoms of spinal stenosis.  3.  Insomnia.   PLAN:  The patient does not need any refills on her medications.  She is  currently taking Lidoderm, Neurontin, and Darvocet.  She takes about one  Darvocet a day and one Neurontin 100 mg at night and she uses Lidoderm on a  p.r.n. basis.  Discussed treatment options with respect to her hip.  We  talked about possibly further therapy for it, icing, heat and anti-  inflammatory medications, however, she got quite a bit of relief with the  last injection and she would like to proceed with another trochanteric  bursitis injection.  Informed consent was obtained and risks and benefits  were reviewed with her regarding the injection.  Alternatives were also  offered.  Risks include bleeding, bruising, and infection.   A 21-gauge needle was inserted to the depths of about 1-1/2 inches.  Bone  contact was made and needle withdrawn slightly and 1 mL of 40 mg/mL Depo-  Medrol plus 2 mL of 1% Lidocaine were injected.  The patient tolerated the  procedure well.  We will see her back in clinic in a month if there is not significant  improvement with the injection.  She is also going to have some stenosis  especially at the L3-4 level with neural foraminal encroachment, this may be  also playing into the mix of her pain at this point.  We will need to  reevaluate her in 1 month.           ______________________________  Brantley Stage, M.D.     DMK/MedQ  D:  03/30/2005 14:37:00  T:  03/30/2005 21:18:23  Job #:  [id number]

## 2010-07-02 NOTE — Assessment & Plan Note (Signed)
INTERVAL HISTORY:  Ms. Beaupre is a 77 year old white married female who was  referred by Dale Elmo.   She had a long history of low back pain and is being seen in our pain and  rehabilitative clinic for chronic low back pain and symptoms of spinal  stenosis.  At the last visit she was also noted to have trochanteric  bursitis.  She was last seen by me in August 2006.  She has since followed  up with Dr. Wynn Banker who has performed an epidural steroid injection and  followed by a trochanteric bursitis injection.  She has done well with both  of these and has noted overall improvement in her pain back in August.  Her  average pain is about an 8 on a scale of 10, currently it is about a 6.  She  reports good relief with both injections.  She is able to walk and stand a  little more than she had been previously.  She gets fair relief with the  current meds that she is on.  Pain is typically exacerbated by walking,  bending, standing, improves with rest, heat, medication and injections.   She is able to climb stairs.  She does drive.  She is independent with her  self-care.   REVIEW OF SYSTEMS:  Review of systems positive for some bowel control  problems, bladder is without any difficulty.   PAST MEDICAL, SOCIAL AND FAMILY HISTORY:  Past medical, social, family  history are unchanged since last visit.   EXAMINATION:  Blood pressure 156/56, pulse 66, respirations 16, 97%  saturated on room air.  She is a well-developed, well-nourished woman who  appears her stated age.  She is oriented x3.  Affect is bright, alert,  cooperative, and pleasant.  Observing her gait she has a stable base of  support, heel-toe mechanics are adequate, balance is overall functional.  Seated reflexes are 2+ at the patella tendons, reduced at the Achilles  tendon on the left and 2+ at the Achilles tendon on the right.  Motor  strength is good throughout with the exception of bilateral EHL is about  4/5.   She still has some mild tenderness over the trochanter on the left down the  iliotibial band.  She also has some medial and lateral joint line tenderness  in the left knee.  No fluid or swelling is noted about the knee.  She does  not have any obvious AP or lateral instability.  She has some tenderness  with palpation along the insertion of the iliotibial band as well.   IMPRESSION:  1.  History consistent with symptoms of spinal stenosis with significant      improvement in epidural injection.  2.  Insomnia.  3.  Bowel control issues related to previous cancer.  4.  Left trochanteric bursitis.   PLAN:  Patient is taking Mobic.  Dr. Gavin Potters is providing this.  She  continues to take Darvocet 100/650 however in the last month she has not  really taken any at all.  She does take some Extra-Strength Tylenol about  four 500 mg tablets a day for a total of 2000 mg a day.  I did spend some  time discussing with her that I would like to have her keep her total  Tylenol dosage under 2000 mg per day and that she will  need to look at  other preparations if she takes them in addition to the Darvocet and  maintain less than 2000 mg  a day.  She also complains of some cramping at  night in the lower extremities.  We will trial her on some Neurontin for  that 100 mg one p.o. at bedtime (#30  given), refilled her Lidoderm today up to three patches 12 hours on/12 hours  off with three refills.  We will see her back then in 2 months.  Nursing  visit in a month.           ______________________________  Brantley Stage, M.D.     DMK/MedQ  D:  01/31/2005 14:04:40  T:  02/01/2005 22:49:42  Job #:  [id number]

## 2010-07-02 NOTE — Assessment & Plan Note (Signed)
[name] is a pleasant 77 year old female who is a patient of Dale Trinway, M.D.  She was last seen in our rehab clinic on March 30, 2005.  She has a history of chronic low back pain symptoms of stenosis and has also  been treated for a left trochanteric bursitis.   At the last visit March 30, 2005, she was injected into the left lateral  hip and today reports an overall improvement in her pain.  At the last visit  her average pain was about a 9 on a scale of 10.  Left lateral hip was quite  painful.  Today she is down to a 4 on a scale of 10, is smiling and is quite  pleased with the improvement of her pain.  She has decreased the amount of  medications she is taking.  Currently she takes one Darvocet at night along  with one 100 mg tablet of Neurontin.  She sleeps well then.  She takes one  Mobic each day, has had no complaints of any GI symptoms, and uses Lidoderm  on a p.r.n. basis as well.   She stays quite functional.  Does have some complaints of some left knee  pain, especially when she is going upstairs, and occasionally at night it  aches.   She reports overall good relief with the current medications that she is on.  She is able to drive.  She is independent with all of her self-care and some  higher-level household activities.   No changes in review of systems, no change in past medical, social or family  history.   PHYSICAL EXAMINATION:  VITAL SIGNS:  Blood pressure 146/58, pulse 62,  respirations 16, 98% saturated on room air.  GENERAL:  She is a well-developed, well-nourished female who appears her  stated age.  She is oriented x3.  Her affect is bright, alert and  cooperative today.  MUSCULOSKELETAL/NEUROLOGIC:  She stands easily after being seated.  Gait is  entirely normal.  Good heel-toe mechanics, normal base of support.  It is  noted as well, tandem gait is normal, Romberg test is negative.  Balance  overall is quite good seated.  Her strength in the  lower extremities is in  the 5/5 range at hip flexors, knee extensors, dorsiflexors, plantar flexors.  She has diminished reflexes at patellar tendons bilaterally and Achilles  tendons bilaterally.  There is no abnormal tone noted.   She has some mild tenderness of the left hip yet.  She also has some  tenderness in the left pes anserine region and along the medial joint line  more so than the lateral joint line.  Mild crepitus is noted.  There is no  effusion noted.  AP and lateral stability is intact.   IMPRESSION:  1.  Left trochanteric bursitis essentially resolved, status post injection      on March 30, 2005.  2.  Symptoms of spinal stenosis.  3.  Insomnia, much improved with 100 mg of Neurontin at night.  4.  Mild left knee osteoarthritis.  5.  Mild to moderate left pes anserine bursitis.   PLAN:  Will refill Ms. Varkey's medications today:   1.  Darvocet-N 100 one p.o. daily, #30.  2.  Neurontin 100 mg one p.o. q.h.s., #30, three refills.  3.  Lidoderm 5%, 12 hours on, 12 hours off, #90, three refills.  4.  Mobic 7.5 mg one p.o. daily, #30, three refills.   We will see her back in  three months.  I anticipate if she has another flare-  up of the left hip of any significance, would like to get her involved in a  physical therapy program to address muscular and balances around her hip as  well as have them do some ultrasound.  I discussed this with her today.  She  seems to be willing to comply with this recommendation.  We will have a copy  of all the notes sent over to Dr. Dale Acalanes [place] at the [facility].           ______________________________  [name], M.D.     DMK/MedQ  D:  04/27/2005 09:41:51  T:  04/28/2005 10:55:14  Job #:  [id number]

## 2010-07-02 NOTE — Assessment & Plan Note (Signed)
[name] is an extremely pleasant married 77 year old female who is  being followed in our pain and rehabilitative clinic for predominantly  low back pain and lower extremity pain which is multifactorial in  nature.   Her left lower extremity pain is felt to be partially secondary to  bursitis as well as lumbar stenosis/neuropathic pain.   Her pain typically gets worse when she is up standing and walking;  improves a bit when she is at rest.  She states her average pain is  about 8 on a scale of 10.  Described as sharp, stabbing and aching in  nature.  Localized across the low back and down the left side of her  left lower extremity.   She has recently undergone a trochanteric bursitis injection on April 19, 2006.  She states that that was quite helpful.  She noted a dramatic  improvement in her pain even that evening.  She subsequently went on to  pursue a left translumbar epidural steroid injection and also noted some  improvement with that particular injection as well which was done on  approximately a week after the hip injection on April 25, 2006, by Dr.  Wynn Banker.   She has difficulty walking for any length of time.  She is able to climb  stairs and is able to drive.  She is independent with her self-care,  needs some assistance with heavier activities such as meal prep,  household duties and shopping.   She reports no changes in her past medical/social/family history.   Medications provided by this clinic include:  1. Darvocet-N 100 one p.o. b.i.d. p.r.n.  2. Lidoderm.  3. Mobic 7.5 mg one p.o. daily p.r.n. pain, #30.   On exam today, her blood pressure is 122/39, pulse 64, respirations 16,  97% saturated on room air.  She is a well-developed, well-nourished  elderly female who appears her stated age.  She does not appear in any  distress.  She is oriented x3.  Her speech is clear.  Her affect is  bright, alert, cooperative, pleasant.  She follows commands without  difficulty.   She transitions from sit to stand easily.  Her gait in the room is  nonantalgic.  She is able to tandem walk.  Romberg test is negative.  She has limitations in lumbar motion in all planes.  Her reflexes are 1+  at the patellas, 0 at the ankles.  Motor strength is in the 5/5 range  without focal weakness.  Her vibratory and position sense are intact as  well.   IMPRESSION:  1. Left lower extremity neuropathic pain.  2. Lumbar stenosis, multilevel.  3. Left trochanteric bursitis/iliotibial band tendonitis.   PLAN:  Will refill the following medications for her:  1. Mobic 7.5 one p.o. daily, #30 three refills.  2. Lidoderm up to three patches 12 hours on/12 hours off.  3. Darvocet-N 100 one p.o. b.i.d. p.r.n. back pain, #60.   She did bring in a bone scan report today which was dated April 26, 2006.  Interpretation says cannot rule out compression fracture on this  bone scan.  Will go ahead and check lumbar spine films for compression.  She does have a set we had done last fall, October 31, 2005, with  which to compare films with.   Again, we reviewed various treatment options with respect to her pain  complaints.  At this point she is satisfied with the medications she is  on.  She is not  interested in increasing any of her medications to a  higher frequency or higher  dosage.  She feels she is functional and has her pain controlled at a  level which she is comfortable with.  Will see her back in a month.           ______________________________  Brantley Stage, M.D.     DMK/MedQ  D:  05/17/2006 14:48:43  T:  05/17/2006 15:23:21  Job #:  [id number]

## 2010-07-02 NOTE — Procedures (Signed)
NAMELAMEISHA, [name]                 ACCOUNT NO.:  [id number]   MEDICAL RECORD NO.:  [id number]          PATIENT TYPE:  REC   LOCATION:  [facility]                          FACILITY:  [facility]   PHYSICIAN:  Erick Colace, M.D.DATE OF BIRTH:  September 04, 1928   DATE OF PROCEDURE:  04/25/2006  DATE OF DISCHARGE:                               OPERATIVE REPORT   PROCEDURE PERFORMED:  Left L4 transforaminal epidural steroid injection.   INDICATIONS FOR PROCEDURE:  Left L4 radiculitis previously relieved with  L4 transforaminal epidural steroid injection in June of 2007.   Informed consent was obtained after describing the risks and benefits of  the procedure to the patient.  These include bleeding, bruising,  infection, loss of bowel and bladder function, temporary or permanent  paralysis as well as steroid effects such as facial flushing and  elevation of blood pressure.  She elects to proceed and has given  written consent.   Patient placed prone on fluoroscopy table.  Betadine prep and sterile  drape. A 25 gauge 1-1/2 inch needle was used to anesthetize skin and  subcutaneous tissue with 1% lidocaine x 2 mL.  Then 22 gauge 3-1/2 inch  spinal needle was inserted under fluoroscopic guidance at the left L4  foramen.  AP lateral and oblique imaging utilized.  Omnipaque 180 x 1 mL  demonstrated no intravascular uptake. Solution containing 1 mL of 40 mg  per mL dexamethasone and 2 mL of 1% methylparaben free lidocaine were  injected.  The patient tolerated the procedure well.  Pre and  postinjection vitals stable.  Postinjection instructions given.  Preinjection pain level 8/10.  Postinjection pain level 2-3/10.  Patient  will follow up with Brantley Stage, M.D. April 2 and determine  whether repeat injection is required at this time.      Erick Colace, M.D.  Electronically Signed     AEK/MEDQ  D:  04/25/2006 11:29:17  T:  04/25/2006 14:50:10  Job:  [id number]

## 2010-07-02 NOTE — Procedures (Signed)
[name], [name]                 ACCOUNT NO.:  [id number]   MEDICAL RECORD NO.:  [id number]           PATIENT TYPE:   LOCATION:                                 FACILITY:   PHYSICIAN:  Erick Colace, M.D.DATE OF BIRTH:  13-Feb-1929   DATE OF PROCEDURE:  10/27/2008  DATE OF DISCHARGE:                               OPERATIVE REPORT   PROCEDURE:  Lumbar epidural steroid injection under fluoroscopic  guidance.   INDICATIONS:  Lumbosacral radiculitis with pain only partially  responsive to medication management, rated at 9/10 and interfering with  meal prep, household duties, and shopping.   PROCEDURE:  A L5-S1 translaminar lumbar epidural steroid injection under  fluoroscopic guidance.   DESCRIPTION OF PROCEDURE:  Informed consent was obtained after  describing risks and benefits of the procedure with the patient.  These  include bleeding, bruising, infection.  She elects to proceed and has  given written consent.  The patient placed prone on fluoroscopy table.  Betadine prep, sterile drape.  A 25-gauge inch and a half needle was  used to anesthetize the skin and subcutaneous tissue with 1% lidocaine  x2 mL.  Then a 18-gauge Tuohy needle was inserted under fluoroscopic  guidance targeting the L5-S1 interlaminar space.  AP, lateral images  utilized.  Once the posterior elements were approximated by the needle  tip, loss-of-resistance technique was utilized advancing the needle  under lateral imaging.  Once a positive loss-of-resistance was obtained  using air/saline mixture, Omnipaque 180 under live fluoro demonstrated  no intravascular uptake with good epidural spread x2 mL followed by  injection of 2 mL of 40 mg/mL Depo-Medrol and 2 mL of 1% MPF lidocaine.  The patient tolerated the procedure well.  Pre and postinjection vitals  stable.  Postinjection instructions given.  She will follow up with Dr.  Pamelia Hoit in 1 month.      Erick Colace, M.D.  Electronically  Signed     AEK/MEDQ  D:  10/27/2008 17:09:07  T:  10/28/2008 08:11:29  Job:  [id number]

## 2010-07-02 NOTE — Assessment & Plan Note (Signed)
[name] is a 77 year old married white female who is being seen in our  pain and rehabilitation clinic for predominantly low back pain, left lateral  hip pain, left knee pain, intermittent neuropathic left lower extremity  pain.   At last visit, she was assessed and determined to have some ongoing left  trochanteric bursitis. She was interested in repeat injection today. Signed  consent is on the chart. Benefits and risks were reviewed with her.  Alternatives were also reviewed with her. She would like to proceed.  Complications include bleeding, bruising and nerve damage, increased pain,  weakness, numbness.   The patient would like to proceed. One mL of Kenalog and 5 mL of 1%  lidocaine were injected into the left trochanteric region after prepping  with Betadine and alcohol. The area was injected without difficulty. No  complications were reported. The patient walked into the room afterward and  noted no worsening of her pain. Discharge instructions were given. Her  Darvocet-N 100 was filled today for her, 1 p.o. b.i.d. #60.   She states that she may have been given an incorrect prescription last month  through her pharmacy. She believes her Darvocet prescription did not read N  100. She is not sure exactly what it read. She will call us and let us know  and will also check with the pharmacy.   She also was trialed on Neurontin. She believes that the 300 mg 3 times a  day increase her diarrhea. She would like to just take 300 mg at night for  the leg pain. She does note overall improvement in the left knee pain with  it. She will continue to use Mobic 7.5 daily. She understands the risks  regarding this medication and would like to continue. She is not requesting  any further pain medication at this time. She reports good relief with these  medications. There are no new medical issues. Past medical, social, family  history are unchanged since our last visit. She remains  independent with her  self care.   PHYSICAL EXAMINATION:  VITAL SIGNS:  Blood pressure 134/74, pulse 60,  respirations 17, 96% saturated on room air.  GENERAL:  She is an elderly female who appears her stated age. She is  oriented x3. Her affect is bright, alert. She is cooperative and pleasant.   She transitions from a sit to stand without difficulty. Her gait is  nonantalgic. Her balance is fair.  Romberg's test is fair. She has good  motor strength in both lower extremities. No significant sensory deficits.  Normal tone is noted throughout both lower extremities as well. Prior to  injection, she is exquisitely tender over her left trochanter region.   IMPRESSION:  1. Left lateral hip pain felt consistent with trochanteric bursitis status      post injection today.  2. History of left L3-4 foraminal encroachment secondary to bone spurs      from vertebral end plate and facet joints.  3. History of symptoms of spinal stenosis.  4. Insomnia.  5. Left knee osteoarthritis.   PLAN:  We will refill her Darvocet-N 100 #60. We will see her back in a  month. Discharge instructions for the trochanteric bursitis injection were  given.           ______________________________  Brantley Stage, M.D.    DMK/MedQ  D:  10/03/2005 12:03:04  T:  10/03/2005 13:49:21  Job #:  [id number]

## 2010-07-02 NOTE — Group Therapy Note (Signed)
[name] is a 77 year old married white female kindly referred by Dr.  Dale Terrell.   She has a long history of low back pain dating back for years.   She was referred to Dr. Gavin Potters for further work-up management because she  had a worsening of her low back pain in the spring.  Apparently she has  undergone a recent MRI which I do not have any records from.   [name] describes pain as being fairly constant in the low back, however,  intensity varies throughout the day.  Her pain is typically worsened by  standing and walking.  Over the years she has noted a decrease in the amount  she is able to stand or walk.  She is currently only able to stand a few  minutes at a time and she is able to walk five minutes or less.   Pain typically starts in her low back and radiates down the left leg  laterally and into the calf and lateral foot.   She has also suffered from a left trochanteric bursitis and reports that she  has had, over the last several months, a couple of injections into this area  which have helped quite a bit with her lateral hip pain.   She tells me sitting does not bother her.  She is able to sleep at night,  however, she does note some tingling and cramping in the left leg.  She  typically sleeps on her side in a flexed position.  She is unable to sleep  flat on her back.   Her average pain is described as a 6 on a scale of 10.  Again, it is  described as fairly constant and waxes and wanes with activity.   The pain typically improves with rest, heat or ice, pacing her activities,  medications and injections into the hip.   She reports fair relief with her current medications.   FUNCTIONAL STATUS:  Able to walk less than five minutes at a time.  She is  able to climb stairs and she currently does drive.  She is independent with  feeding, dressing, bathing, toileting, meal prep and household duties.  Her  husband does the grocery shopping.   REVIEW OF  SYSTEMS:  Positive for bowel control problems since 1988 or 1989  after her colon cancer surgery.   Review of systems also positive for gastrointestinal reflux, urinary  retention, easy bleeding and rashes.   CURRENT MEDICATIONS:  1.  Mobic 7.5 daily.  2.  Protonix 40 mg daily.  3.  Toprol XL 25 mg daily.  4.  Estradiol 2 mg daily.  5.  Tums four times a day.  6.  Extra Strength Tylenol 500 mg four times a day.  7.  Darvocet-N 100 she takes one per day on a p.r.n. basis.   PHYSICIANS CURRENTLY INVOLVED IN HER CARE:  1.  Dale Teton, M.D.  2.  Dr. Saverio Danker.  3.  Dr. Ruthann Cancer.   PAST MEDICAL HISTORY:  1.  History of colon cancer dated 35 and 1989.  2.  Hypertension.   She denies heart problems, lung problems, previous stroke, thyroid problems,  diabetes, liver or kidney problems.   PAST SURGICAL HISTORY:  1.  Hemorrhoid and tubal ligation 1975.  2.  Hysterectomy 1982.  3.  Colon cancer surgery 1988 and 1989.  4.  Blocked bowel 1991.  5.  Gallbladder surgery 1993.   She also has a history of an  abscess in 1997.   SOCIAL HISTORY:  Patient is married, lives with husband, she has been  married 52 years.  Denies alcohol use or smoking.   FAMILY HISTORY:  Positive for heart disease, lung disease, diabetes, high  blood pressure.   PHYSICAL EXAMINATION:  GENERAL APPEARANCE:  She is a well-developed, well-  nourished elderly female who appears her stated age.  She is oriented x3.  Her affect is bright and alert. She is cooperative and pleasant.  VITAL SIGNS:  Blood pressure 139/74, pulse 72, respirations 16, 98%  saturated on room air.   She is able to stand without difficulty after being seated.  Her gait is  stable and normal.  There is no antalgia today.  She is able to flex forward  and extend back without increasing her pain at all.  Romberg's test is  negative.  She has no sway.   Seated reflexes are 1+ at patellar tendons bilaterally.  She has 2+ at  the  right Achilles tendon and 1+ at the left Achilles tendon.  She reports  diminished sensation to pinprick throughout the left lower extremity intact  with testing cold sensation throughout both lower extremities.  Motor  strength is excellent throughout, 5/5 at hip flexors, knee extensors,  dorsiflexors, plantar flexors.  EHL is approximately 4 to 4+ bilaterally.  Straight leg raising is negative.   She does have tenderness from the left trochanteric region distally  throughout the iliotibial band to its insertion around the knee, tenderness  throughout this area.   IMPRESSION:  1.  Her history is consistent with symptoms of spinal stenosis with her      inability to stand more than a few minutes or walk for more than a few      minutes or even lay flat.  We would like to obtain the recent MRI scan      for review.  She does have some mild reflex and sensory changes in the      left lower extremity.  2.  Insomnia.  3.  Bowel control issues related to previous cancer surgery.  4.  Left trochanteric bursitis.  I asked patient to show me the exercises      she is doing to address this issue.  She showed me some shoulder      stretches which will not improve the musculature around her hip at all.   PLAN:  1.  Would like to obtain recent MRI scan apparently dated July 2006.  I do      not have any notes regarding this particular scan.  She will make sure      it gets sent over to [place] for review.  2.  Will write physical therapy orders for Ms. Chaplin today for diagnosis of      left trochanteric bursitis, iliotibial band syndrome.  Will have      therapist focus on IT band and trochanteric area with ultrasound and      heat to the iliotibial band, would like a lower extremity stretching and      flexibility program and to work on balance musculature on the left hip      and to provide her with a home program.  Would like them to see her two     to three times.  No medicines were  prescribed for her today at this      visit.  May consider an epidural steroid injection with her.  Would like  to follow up on the MRI scan.  Of note, there were references to two      previous sets of radiographs which were done on March 30, 2000, and      February 20, 2001, showing some degenerative changes at L4-5 and L1-2 with      minimal spondylolisthesis.  Will see her back in a month.           ______________________________  Brantley Stage, M.D.     DMK/MedQ  D:  10/06/2004 12:33:31  T:  10/06/2004 15:04:51  Job #:  [id number]   cc:   Dale [place]  [address]  [place]  [place] [address]  Fax: [phone]   Saverio Danker, M.D.

## 2010-07-02 NOTE — Procedures (Signed)
[name], [name]                 ACCOUNT NO.:  [id number]   MEDICAL RECORD NO.:  [id number]          PATIENT TYPE:  REC   LOCATION:  [facility]                          FACILITY:  [facility]   PHYSICIAN:  Erick Colace, M.D.DATE OF BIRTH:  [birth year]   DATE OF PROCEDURE:  07/28/2005  DATE OF DISCHARGE:                                 OPERATIVE REPORT   PROCEDURE PERFORMED:  Left L4 transforaminal epidural steroid injection  under fluoroscopic guidance.   INDICATIONS FOR PROCEDURE:  L4 radiculopathy with lumbar radiculitis.  She  also has spinal stenosis.   Informed consent was obtained after describing risks and benefits of the  procedure to the patient.  These include bleeding, bruising, infection, loss  of bowel and bladder function, temporary or permanent paralysis and she  elects to proceed and has given written consent.  Last injection done in October of 2006 had about five months relief.   DESCRIPTION OF PROCEDURE:  Patient placed prone on fluoroscopy table with  Betadine prep, sterile drape.  A 25 gauge 1-1/2 inch needle was used to  anesthetize skin and subcutaneous tissue with 1% lidocaine x 2 mL.  A 22  gauge 3-1/2 inch spinal needle was inserted under fluoroscopic guidance  targeting the left L4-5 intervertebral foramen.  AP lateral and oblique  imaging was utilized.  Initial Omnipaque 180 injection under liver  fluoroscopy demonstrated no significant epidural flow.  There was no  intravascular flow.  The needle was redirected and Omnipaque 180 times 1 mL  injected, demonstrated subpedicular flow, also tracking through the L4-5  level.  Then a solution containing 1 mL of 40 mg per mL Depo-Medrol and 2 mL  of 1% MPF lidocaine were injected.  The patient tolerated the procedure  well.  Post injection instructions were given.  Preinjection pain level  10/10, postinjection pain level 8/10.  She will follow up with Dr.  Pamelia Hoit in three to four weeks to assess for possible  need for  reinjection.      Erick Colace, M.D.  Electronically Signed     AEK/MEDQ  D:  07/28/2005 14:36:40  T:  07/28/2005 14:55:23  Job:  [id number]   cc:   Brantley Stage, M.D.  Fax: [phone]

## 2010-07-02 NOTE — Assessment & Plan Note (Signed)
[name] is a 77 year old female who is being seen in our Pain and  Rehabilitative Clinic for chronic low back pain, symptoms of spinal stenosis  and trochanteric bursitis, iliotibial band syndrome.  She has been  participating in a physical therapy program to work on lower extremity  strengthening, balance.  She is attending therapy at [facility].  Progress note from the therapist today states she is making good  progress and she has had improvement in her pain and abilities to ambulate.   [name] states I can tell a big difference.  I can stand longer and I can  walk longer and I can walk straighter.  She states she finds the Neurontin  to be somewhat sedating.  Percocet can also make her somewhat sedated as  well.  Her average pain is about a 5 on a scale of 10.  It is improved from  last visit which is a rough 7.  She is a poor sleeper.  She is gets fair  relief with current medications.  Pain continues to be localized, especially  to the left low back region and radiates down the left hip to the left knee.  She can walk about 12 to 15 minutes at a time.  She is able to climb stairs.  She is able to drive.  Health and history form are reviewed and this is  attached to the chart today.   Past medical, social and family history otherwise unchanged since her last  visit.   EXAM:  Pulse 68, respirations 16, 96% saturating on room air.  She is well-  developed, well-nourished elderly female who appears her stated age.  She is  oriented x3.  She does not appear in any distress.  Her affect is bright.  She is alert and she is cooperative.  Her speech is clear.  She follows  commands without difficulty.  She transitions from sit to stand without any  problems.  Her gait is stable in the room.  She has some difficulty with  tandem gait, however.  Her lower extremity motor strength is good in both  lower extremities including hip flexion, knee extensor, dorsiflexor and  plantar flexors.  Reflexes are 0 at bilateral patellar tendons and 1+ at the  Achilles tendons.  She has a tender area just inferior to the left patella  and somewhat laterally.  This is not along the joint line.  It appears it  may be an area where the iliotibial band is attaching below the knee.   IMPRESSION:  1. Left lower extremity neuropathic pain.  2. Lumbar stenosis, multi-level.  3. Flexion/extension films are reviewed with her today.  The results are      attached to chart.  She has scoliosis with marked degeneration of the      disc at lumbar vertebrae-1/2 and lumbar vertebrae-2/3 and areas of      malalignment particularly at lumbar vertebrae-2/3 and lumbar vertebrae-      4/5 which change minimally with flexion/extension.   We will have her continue to participate in her physical therapy program to  work on balance and general lower extremity strengthening.  She is doing  well on this program per her report as well as physical therapist progress  report.  She is gaining in lower extremity strength, ability to walk longer  and ability to walk somewhat straighter.   We will see her back in two months.  She reports she does not need referral  to neurosurgery at this time, that she is doing so much better.  In two  months, we will reevaluate her for possible lumbar epidural steroid  injection.  We will review her medications.  She would like to decrease  Neurontin.  She will take 100 mg in the morning and 300 mg at night.  She  will take a half a Percocet in the morning and a half a 5 mg Percocet in the  morning and a full one in the evening.  If she calls, we will refill her  Percocet as she needs to if she runs out in the next two months.  She has a  total of 46 Percocets at this time.  We will see her back in two months.           ______________________________  Brantley Stage, M.D.    DMK/MedQ  D:  11/28/2005 11:27:17  T:  11/28/2005 23:29:00  Job #:  [id number]

## 2010-07-02 NOTE — Assessment & Plan Note (Signed)
Ms. Riesen is a 77 year old married white female who is being seen in our  pain and rehabilitative clinic for predominately low back pain and left  lateral hip pain, left knee pain and intermittent neuropathic left lower  extremity pain.   She underwent left L4 lumbar epidural steroid injection on July 28, 2005.  She had undergone previous left L4 lumbar epidural steroid injection last  fall and got many months relief with the previous one.  In the interim she  has had a couple of left trochanteric bursitis injections which have also  offered her relief.   She states that this last epidural steroid injection did not given her the  relief that she had with the first one.  She is a bit disappointed.  She  states her pain continues to be along the lateral hip.  Worse when she lays  on it.  She also has some shooting discomfort into the left lower extremity,  tingling which is somewhat aggravating for her as well.  She states also  that the left knee aches, not during the day however, but when she goes to  bed at night.   Her average pain is about a 7 on a scale of 10.  It interferes a lot with  her activity.  Sleep is interfered with it as well.  She gets fair relief  with the current meds that she is taking.   She is limited in how much she can really walk.  She is able to climb stares  and she is currently able to drive as well.  She is independent with her  self care.  She is able to do some meal prep.  She does not do grocery  shopping or higher level household duties.   Health and history form are reviewed.  There are no new changes in her past  medical, social or family history since our last visit.   MEDICATIONS:  She had been taking Vicodin 5/500 1-4 a day in addition to 1  Darvocet per day.  She also takes Mobic 7.5 daily and Neurontin 100 mg  q.h.s. and finds that the Lidoderm is not that helpful for her.   PHYSICAL EXAMINATION:  VITAL SIGNS:  On exam today her blood  pressure is  slightly elevated at 170/90, pulse 66, respirations 16, 98% saturation on  room air.  She states that she has and appointment with her internist in the  upcoming weeks and will have that reevaluated.  GENERAL:  She is a well-developed, well-nourished female who appears her  stated age.  She does not appear in any distress.  She is oriented.  Her  affect is bright and alert.  She is cooperative and pleasant.  NEUROLOGICAL:  Transitions from sit to stand.  Her gait initially is a  little bit antalgic.  Decreased weight bearing on the left as she takes a  few steps, however her gait evens out in the room.  She has normal heel and  toe mechanics.  Normal base of support.  She has difficulty with tandem  gait.  Romberg's test is adequate, however.  In the seated position reflexes  are 1+ at patella tendons, 2+ at the Achilles tendons.  She has good  strength throughout both lower extremities.  No focal weakness is noted.  Sensation is intact in both lower extremities with pinprick.  Palpation over  the left trochanter reveals tenderness exquisitely over the greater  trochanter and somewhat distally along the iliotibial band.  She also has tenderness along the medial joint of the left knee.   Flexion does not bother her as much as extension does during assessment of  lumbar spine motion.   IMPRESSION:  1.  Status post left L4 epidural steroid injection with not significant      improvement.  2.  Continued left lateral hip pain consistent with left trochanteric      bursitis.  Left lower extremity neuropathic pain, tingling, burning,      prickling sensation in the left lower extremity intermittently.  3.  History of left L3-4 foraminal encroachment secondary to bone spurs and      vertebral end plate and facet joint osteophyte.  4.  Some symptoms of spinal stenosis.  5.  Insomnia.  6.  Left knee osteoarthritis.   PLAN:  I spent 15 minutes discussing medications with her.   She finds the  Vicodin not to be very useful in helping manage her pain.  We will continue  the Mobic, however, 7.5 mg 1 p.o. q.d. #30.  Again, I discussed the risks  and benefits with this medication.  The risks include GI bleed,  cardiovascular problems.   There is no barrier to communication.  She understands the risks and would  like to continue taking Mobic.  She finds it does help her function daily  with it.  She found that the Neurontin seems to help her left lower  extremity dysesthetic sensations.  She will go ahead and increase the  Neurontin over the next month.  We will increase it to 300 on p.o. q.h.s.  for one week and then b.i.d. for the next week and then t.i.d. thereafter.   We will continue to allow her to take Darvocet-N 100 1 p.o. b.i.d. p.r.n.  back or leg pain #60.  We discussed possibly adding Percocet at this point.  However, she would like a trial of Neurontin to see if it is of benefit.  If  it not affording her sufficient relief we may write her a prescription for  5/325 Percocet #30 for flare ups.  We will see her back in a month.  At the  next visit I anticipate we will be injecting her left hip bursal region.  She is currently stable on these medications.  Ms. Nickelson uses her  medications appropriately.  No aberrant behavior has been observed with her.  She gets some pain relief with them and is able to maintain her function to  some extent.  We will see her back in a month.           ______________________________  Brantley Stage, M.D.     DMK/MedQ  D:  09/02/2005 12:39:29  T:  09/02/2005 15:06:15  Job #:  [id number]

## 2010-07-02 NOTE — Assessment & Plan Note (Signed)
[name] is a pleasant 77 year old married white female who is accompanied  by her daughter today to our pain and rehabilitative clinic.  [name] is  seen predominantly for low back pain and left lateral hip pain, left knee  pain, intermittent neuropathic pain in the left lower extremity.   Over the last year, [name] has had intermittently left neuropathic leg  pain and alternating with some left trochanteric bursitis.  She has  undergone injections for both over the year and has found improvement each  time she gets injections.   She takes Darvocet-N 100 twice daily also for pain relief.  Her daughter is  with her and states that she feels this may not be adequate in managing her  pain.  [name] states that she thinks she might need a little bit more as  well.   [name] is again requesting epidural steroid injection.  Her last one was  back in June of 07.   She states her average pain is about a 7 on a scale of 10.  Her sleep tends  to be poor.  Her pain is typically when she is up and active.  When she is  sitting, she is relatively pain free.  Her pain  goes down through the left  buttock into the lateral hip region and to the left lower extremity  described as tingling, aching, sharp at times and burning in its nature.  She is very limited in how long she can walk at this point.  She is able to  climb stairs, she is able to drive.   Pain is worse with walking, standing, improves with rest, heat, medication  and injections.   She is independent with her self care, needs some help with household duties  and shopping.   She admits to occasional cramps and some dizziness.  Her daughter states she  does have some inner ear problems at times.  Her daughter also states that  she has noticed some instability in [name]'s gate over the last couple of  months as well.   There were no new changes in her past medical, social or family history  since our last visit.   Her  medications include the following:  Mobic, Protonix, Toprol, estradiol,  Tums, Extra Strength Tylenol, Darvocet, Lisinopril.  She is also taking from  our clinic Neurontin 300 mg three times daily.  She found it made her too  dizzy.  She went down to 300 mg twice daily.  She takes Darvocet-N 100 twice  a day from our clinic as well and Mobic 7.5 mg 1 by mouth daily.   One exam today her blood pressure is 144/70, pulse 64, respiratory rate 18,  she is 96% saturated on room air.  She is a well-developed, well-nourished  female who appears her stated age.  She does not appear in any distress as I  interviewed her.  Her affect is bright.  She is alert, cooperative,  pleasant.   She transitions from sit to stand.  She is able to stand without pushing up  on the hand rails of the chair.  Her gait in the room is symmetric and  stable.  However, tandem gait she does have some difficulty with Romberg's  test.  She was able to stand adequately.   She has limitations in lumbar range of motion in all planes.  She has 2+  patellar tendon reflexes, bilateral patellars and right Achilles.  She has  no reflexes in the left Achilles tendon.  Her motor strength is 5/5 at the  right hip flexors, 4/5 at the left hip flexor.  Knee extensors bilaterally  are 5/5.  Dorsiflexors bilaterally are 5/5 and she has slight weakness in  bilateral EHLs.  Knee flexors are 5/5 as well.  She has intact sensation to  light touch and pinprick in both lower extremities.   IMPRESSION:  1. Left lower extremity neuropathic pain worse with walking and being      upright.  2. Lumbar spondylosis multilevel, particularly there is some apparent      central canal stenosis at L5 and left L3-4 foraminal encroachment due      to bone spurs from the vertebral end plate and facet joints.  She also      had some mild left knee osteoarthritis and symptoms of spinal stenosis      with decreased stability to ambulate any distance.    PLAN:  1. Obtain flexion-extension lumbar spine films.  2. Will set her up with physical therapy program to work on balance.  Will      switch her from Darvocet-N 100 twice daily to Percocet 5/325 1 by mouth      three times daily as needed and will start her on Neurontin 200 mg 1 by      mouth three times daily and have her set up for another epidural      steroid injection.   Family is requesting referral to neurosurgery as well to determine what her  options are from that aspect.  We will send off a referral today as well.  I  will see her back in a month.           ______________________________  Brantley Stage, M.D.     DMK/MedQ  D:  10/31/2005 12:11:58  T:  11/01/2005 08:14:25  Job #:  [id number]

## 2010-07-02 NOTE — Assessment & Plan Note (Signed)
[name] is a 77 year old female who is a patient of [facility].   She was last seen in our rehab clinic on April 27, 2005.   She states she has been seen in our pain and rehabilitative clinic for low  back pain and intermittent left leg pain.   She has, she underwent a lumbar epidural steroid injection back in October  of 2006 and subsequent to that she has had 2 left trochanteric bursitis  injections on January 03, 2005, and March 30, 2005.   She is back in and states that when she is up walking she is not able to  walk quite as far as she used to and has had a recurrence of the left low  back and lateral hip pain.  She states the pain is about a 10 on a scale of  10, interfering quite a bit with her activity level.  Sleep is poor.  Gets  fair relief with the current meds.  Pain is described as sharp, burning,  stabbing, tingling, aching.   She states she does have some good days and she does have some days in which  her function is quite limited.   Currently she says she really has trouble walking very far at all at this  point.  She is independent with her self-care for the most part.  She does  get some cramps in her feet and legs occasionally.  No other changes noted  in past medical, social or family history.   EXAM:  Blood pressure is 144/71, pulse 65, respirations 16, 99% saturated on  room air.  She is a well-developed, well-nourished female who appears her  stated age.  She is oriented x3.  Her affect is bright, alert.  She is  cooperative and pleasant.   She transitions from sit to stand somewhat slowly.  Her gait in the room is  stable.  It is symmetric for the most part.  However, she has some  discomfort with forward flexion as well as extension, seated reflexes are 1-  2+ at the knees, diminished at the ankles.  Motor strength is in the 5/5  range, no focal weakness is noted, no new sensory deficits are noted.  Has  some tenderness over the left  trochanter again today.   IMPRESSION:  1.  She has a history of left L3-4 neuroforaminal encroachment secondary to      bone spurs of the vertebral endplate and facet joint osteophytes.  2.  Left trochanteric bursitis intermittently.  3.  Has some symptoms of spinal stenosis.  4.  Insomnia.  5.  History of left knee osteoarthritis.   PLAN:  Will switch her Darvocet today to Vicodin 5/500 one p.o. up to q.i.d.  per day for leg and back pain, #90 given, no refills.  Will see how she does  on that.  I cautioned her about taking any more acetaminophen than what I  have prescribed with the Vicodin at this point.  Will hold off on refilling  her Darvocet for her.  She does get some relief with the Darvocet, however,  she is in more of a flare-up at this point and most likely will benefit from  another epidural steroid injection.  Will go ahead and get this set up again  with Dr. Wynn Banker.  Last epidural steroid injection was back in October of  2006.  She got quite a bit of relief with that for several months.   She does not need  refills on any other medication at this time.  She does  take Mobic, Neurontin and use Lidoderm as well.  She is currently stable on  these medications, she has displayed no aberrant behavior, has had no  complications with these meds.  Will see her back in a month.           ______________________________  Brantley Stage, M.D.     DMK/MedQ  D:  07/18/2005 14:13:07  T:  07/19/2005 10:17:57  Job #:  [id number]

## 2010-07-05 ENCOUNTER — Ambulatory Visit: Payer: Self-pay | Admitting: Unknown Physician Specialty

## 2010-07-09 NOTE — Assessment & Plan Note (Signed)
Ms. Abaigeal Moomaw is a pleasant 77 year old woman who is followed at our [facility] primarily for chronic low back pain, lateral hip pain, and a history of spinal stenosis.  She has a history of neurogenic claudication and leg pain with ambulation or prolonged standing.  She is back in today for refill of her oxycodone. She typically takes one or two tablets of the 5 mg dosage each day for pain.  Over the last couple of weeks, she has had increased pain in the lateral hips worse on the left than on the right, which is aggravating her ability to stand or sit for any length of time.  She is quite frustrated by this and is requesting a repeat injection into this hip.  She had a right hip injection back in the fall and now is requesting left hip injection.  She has obtained good relief with injections in the past in both of her hips.  FUNCTIONAL STATUS:  She can walk very little at this time not more than a minute or two.  She is able to climb stairs and drive.  She is independent with feeding, dressing, bathing, and toileting.  Denies any new problems with respect to bowel or bladder.  Denies depression. Denies suicidal ideations.  No other changes noted on review of systems.  Past medical, social, and family history unchanged.  Medications prescribed through [facility] include: 1. Ultracet one p.o. b.i.d. p.r.n. 2. Mobic 7.5 daily. 3. P.r.n. oxycodone 5 mg not more than one or two per day.  PHYSICAL EXAMINATION:  VITAL SIGNS:  Her blood pressure is 127/73, pulse 59, respiration 18, 96% saturated on room air. GENERAL:  She is a well-developed, well-nourished, elderly female who does not appear in any distress.  She is oriented x3.  Speech is clear. Affect is bright.  She is alert, cooperative and pleasant.  Follows commands without difficulty, answers my questions appropriately. Cranial nerves and coordination are intact.  Reflexes are  diminished in the lower extremities.  No abnormal tone, clonus or tremors are noted. Motor strength is good without focal deficit in the lower extremities. Some mild tenderness in the lumbar spine noted as well today.  Internal and external rotation at the hips does not aggravate her pain. She does have complaints of pain along the lateral trochanters especially on the left, it is quite exquisitely tender.  She is able to transition slowly from sitting to standing.  Gait is slow and careful and slightly antalgic.  Forward flexion and extension are limited.  IMPRESSION: 1. Left trochanteric bursitis. 2. Multilevel severe lumbar degenerative disk     disease/spondylosis/multilevel disk disease and spondylolisthesis     with multilevel mild-to-moderate foraminal narrowing and lateral     stenosis. 3. Symptoms of neurogenic claudication. 4. Bilateral knee osteoarthritis. 5. Chronic low back pain.  PLAN:  Variety of treatment options were outlined with Ms. Lindo today including doing nothing, consider changing medications, injections, physical therapy.  We discussed several medications today including Lyrica and gabapentin.  She would like to hold off on this.  She has concerns about oversedation and falls.  She finds the current oxycodone 1-2 a day in addition to tramadol on a p.r.n. basis quite helpful.  She is requesting injection of the left hip.  She has had quite a bit of success with previous injections.  A consent was signed today to pursue left hip injection and she is placed in the lateral decubitus position. The left  trochanter region was swabbed with alcohol and chlorhexidine. A 22-gauge 2-inch needle was placed into the area of the trochanter, 1 mL of Kenalog and 6 mL of 1% lidocaine was injected.  She tolerated the procedure very well.  The area was covered with sterile dressing, discharge instructions were given.  She reported improvement in lateral hip pain after the  injection and while ambulating in the room.  She was given a prescription for oxycodone 5 mg one p.o. b.i.d. for hip or back pain #60 and lumbar radiographs were ordered as well today.  I have answered all her questions.  She is comfortable with our management plan.  See her back in a month.     Brantley Stage, M.D. Electronically Signed    DMK/MedQ D:  04/19/2010 14:11:16  T:  04/19/2010 20:46:15  Job #:  [id number]

## 2010-07-14 NOTE — Assessment & Plan Note (Signed)
[name] is a very pleasant 77 year old woman who is followed here at [facility] for chronic pain complaints related to her low back.  She has a severe multilevel lumbar degenerative disk disease, spondylosis/degenerative scoliosis.  At the last visit, she was having increased low back pain and there was some concern of possible compression.  Radiographs were carried out on April 19, 2010 which showed no acute fracture or abnormal motion in her lumbar spine.  In fact degenerative changes had stayed relatively stable since the previous x-rays that were done back in 2009.  [name] is tolerating the current medications that she is on.  She is using Ultracet, sometimes up to twice a day and oxycodone 5 mg up to twice a day.  She also uses Mobic 7.5 typically once a day as well.  She has not had any problems with oversedation or constipation.  No abdominal complaints.  She seems to be tolerating these medications relatively well and reports rather fair to good relief with the use of these medicines.  Her average pain is about a 8 on a scale of 10.  Pain is typically worse when she is up walking or standing and improves with rest and pacing her activities.  She also uses TENS unit and has had trochanteric bursitis injections which help her quite a bit as well over the last couple of years.  There are no changes in her functional status.  Overall she tells me she is "I think I am doing well at this time."  Denies depression.  Admits to some anxiety.  Denies suicidal ideation. No changes in bowel or bladder.  No changes in her medical status since last visit.  On exam today, her blood pressure is 127/69, pulse 58, respiration 18, 96% saturated on room air.  She is a well-developed, well-nourished elderly female who appears her stated age and does not appear in any distress.  She is oriented x3.  Speech is clear.  Affect is bright.  She is alert,  cooperative, pleasant.  Follows commands without difficulty, answers my questions appropriately.  Cranial nerves and coordination are intact.  Reflexes are diminished in the lower extremities.  No abnormal tone, clonus, or tremors are noted.  Sensation is intact.  Motor strength is 5/5 at hip flexors, knee extensors, dorsiflexors, plantar flexors, EHL, and dorsiflexors.  She transitions from sitting to standing with relative ease today.  Her gait is stable.  She does not use a cane in the room at this time but she does typically use the cane for community distances.  Sometimes she uses it in the house as well.  There is no pain with internal or external rotation at the hips.  She has minimal tenderness to palpation in the lower lumbar spinal segment.  She has some mild tenderness over the right trochanter, minimal tenderness over the left trochanter.  IMPRESSION: 1. Resolution of left trochanteric bursitis status post injection at     last visit. 2. Mild right trochanteric bursitis. 3. Advanced chronic lumbar scoliosis and disk degeneration,     multisegmental spondylosis. 4. Symptoms of neurogenic claudication. 5. Bilateral knee osteoarthritis. 6. Chronic lower back pain.  PLAN:  Discussed treatment options with [name] today regarding the back and intermittent leg pain, claudication-type symptoms.  She understands that these medications are to provide her comfort only at this time.  They are not going to help improve her low back arthritis at all.  We have discussed  having her see a spine surgeon as well today. She is not interested in surgical option at this time.  Our goals with respect to [facility] here are to maintain her strength and endurance, continue to monitor her balance and pain. We will monitor her pain medications.  I encouraged her to continue to engage in her activities of daily living and pace her activities.  She has not had any problems  with constipation, over sedation, or abdominal complaints from the medication.  She has not had any problems with dizziness from them as well.  She finds that the medications are helping her maintain her function.  She takes them as prescribed without evidence of aberrant behavior.  We will continue to monitor her pill counts.  I have answered all her questions.  She is comfortable with our management plan to see a nurse next month for refill of her medications.     Brantley Stage, M.D. Electronically Signed    DMK/MedQ D:  05/17/2010 12:23:13  T:  05/18/2010 05:57:19  Job #:  [id number]

## 2010-07-30 ENCOUNTER — Ambulatory Visit: Payer: Self-pay | Admitting: Unknown Physician Specialty

## 2010-08-02 LAB — PATHOLOGY REPORT

## 2010-09-28 ENCOUNTER — Ambulatory Visit: Payer: Self-pay | Admitting: General Practice

## 2010-12-27 ENCOUNTER — Ambulatory Visit: Payer: Self-pay | Admitting: Internal Medicine

## 2011-10-13 ENCOUNTER — Ambulatory Visit: Payer: Self-pay | Admitting: Unknown Physician Specialty

## 2011-12-08 ENCOUNTER — Encounter: Payer: Self-pay | Admitting: Internal Medicine

## 2011-12-08 ENCOUNTER — Ambulatory Visit (INDEPENDENT_AMBULATORY_CARE_PROVIDER_SITE_OTHER): Payer: Medicare Other | Admitting: Internal Medicine

## 2011-12-08 VITALS — BP 118/62 | HR 60 | Temp 97.8°F | Ht 62.5 in | Wt 140.8 lb

## 2011-12-08 DIAGNOSIS — M949 Disorder of cartilage, unspecified: Secondary | ICD-10-CM

## 2011-12-08 DIAGNOSIS — M8580 Other specified disorders of bone density and structure, unspecified site: Secondary | ICD-10-CM

## 2011-12-08 DIAGNOSIS — K2270 Barrett's esophagus without dysplasia: Secondary | ICD-10-CM

## 2011-12-08 DIAGNOSIS — M549 Dorsalgia, unspecified: Secondary | ICD-10-CM

## 2011-12-08 DIAGNOSIS — Z23 Encounter for immunization: Secondary | ICD-10-CM

## 2011-12-08 DIAGNOSIS — E7800 Pure hypercholesterolemia, unspecified: Secondary | ICD-10-CM

## 2011-12-08 DIAGNOSIS — M899 Disorder of bone, unspecified: Secondary | ICD-10-CM

## 2011-12-08 DIAGNOSIS — C189 Malignant neoplasm of colon, unspecified: Secondary | ICD-10-CM

## 2011-12-08 DIAGNOSIS — I10 Essential (primary) hypertension: Secondary | ICD-10-CM

## 2011-12-08 DIAGNOSIS — G8929 Other chronic pain: Secondary | ICD-10-CM

## 2011-12-08 DIAGNOSIS — Z139 Encounter for screening, unspecified: Secondary | ICD-10-CM

## 2011-12-08 MED ORDER — SIMVASTATIN 10 MG PO TABS: 10.0000 mg | ORAL_TABLET | Freq: Every day | ORAL | Status: DC

## 2011-12-08 MED ORDER — LISINOPRIL-HYDROCHLOROTHIAZIDE 10-12.5 MG PO TABS: 1.0000 | ORAL_TABLET | Freq: Every day | ORAL | Status: DC

## 2011-12-08 MED ORDER — ESTRADIOL 2 MG PO TABS: 2.0000 mg | ORAL_TABLET | Freq: Every day | ORAL | Status: DC

## 2011-12-08 NOTE — Patient Instructions (Addendum)
It was nice seeing you today.  I am glad you have been doing well.  We will get you mammogram scheduled.

## 2011-12-18 ENCOUNTER — Encounter: Payer: Self-pay | Admitting: Internal Medicine

## 2011-12-18 DIAGNOSIS — K2270 Barrett's esophagus without dysplasia: Secondary | ICD-10-CM | POA: Insufficient documentation

## 2011-12-18 DIAGNOSIS — Z85038 Personal history of other malignant neoplasm of large intestine: Secondary | ICD-10-CM | POA: Insufficient documentation

## 2011-12-18 DIAGNOSIS — I10 Essential (primary) hypertension: Secondary | ICD-10-CM | POA: Insufficient documentation

## 2011-12-18 DIAGNOSIS — G8929 Other chronic pain: Secondary | ICD-10-CM | POA: Insufficient documentation

## 2011-12-18 DIAGNOSIS — M8580 Other specified disorders of bone density and structure, unspecified site: Secondary | ICD-10-CM | POA: Insufficient documentation

## 2011-12-18 DIAGNOSIS — E7800 Pure hypercholesterolemia, unspecified: Secondary | ICD-10-CM | POA: Insufficient documentation

## 2011-12-18 NOTE — Assessment & Plan Note (Signed)
Last EGD 07/30/10 revealed hiatal hernia and some changes suspicious for Barretts.  Path negative.  Reflux under reasonable control.  Has follow up with Dr Markham Jordan next week.

## 2011-12-18 NOTE — Assessment & Plan Note (Signed)
Has chronic back and leg pain.  Has had extensive w/up.  Now seeing Dr Chasnis.  He prescribes her pain meds.  Stable.  Follow.   

## 2011-12-18 NOTE — Progress Notes (Addendum)
  Subjective:    Patient ID: [name], female    DOB: Sep 01, 1928, 78 y.o.   MRN: [id number]  HPI 78 year old female with past history of hypertension, hypercholesterolemia, colon cancer, Barretts esophagitis and chronic back and leg pain who presents today for a scheduled follow up.  She states she is doing relatively well.  Seeing Dr Yves Dill for her back and legs.  Feels things are stable.  Still some reflux issues, but it appears symptoms are under reasonable control on her current regimen.  She did find out she was lactose intolerant and treatment for this has helped.  Due to see Dr Markham Jordan next week.  Breathing stable.  Still occasional hot spells.  Saw Dr Renae Fickle.  W/up unrevealing.  On Estrogen and desire not to stop.  Discussed risk and side effects of long term estrogen therapy.  She elects to continue and not reduce the dose.  Overall she feels she is doing well.   Past Medical History  Diagnosis Date  . Colon cancer     adenocarcinoma, s/p resection x  2  . Bowel obstruction     s/p adhesion resection  . Recurrent sinus infections   . Vertigo   . Barrett's esophagus   . Hiatal hernia   . Hypercholesteremia   . Hypertension   . Chronic back pain   . Peripheral neuropathy     Review of Systems Patient denies any headache, lightheadedness or dizziness. No sinus or allergy symptoms.  No chest pain, tightness or palpitations.  No increased shortness of breath, cough or congestion.  No nausea or vomiting.  No abdominal pain or cramping.  Acid reflux under reasonable control on her current regimen.  No significant bowel change, such as significant diarrhea, constipation, BRBPR or melana. Bowels are doing better.   No urine change.        Objective:   Physical Exam Filed Vitals:   12/08/11 1352  BP: 118/62  Pulse: 60  Temp: 97.8 F (1.28 C)   78 year old female in no acute distress.   HEENT:  Nares - clear.  OP- without lesions or erythema.  NECK:  Supple, nontender.  No audible  bruit.   HEART:  Appears to be regular. LUNGS:  Without crackles or wheezing audible.  Respirations even and unlabored.   RADIAL PULSE:  Equal bilaterally.  ABDOMEN:  Soft, nontender.  No audible abdominal bruit.   EXTREMITIES:  No increased edema to be present.                     Assessment & Plan:  HOT/FLUSHED FEELING.  Saw Dr Renae Fickle.  Had labs.  States everything checked out fine.  Obtain records.  On estrogen.  Desires not to stop.  Better.    HEALTH MAINTENANCE.  Last physical 11/02/10.  Colonoscopy and EGD 07/30/10.  Colonoscopy revealed internal hemorrhoids.  EGD revealed hiatal hernia and some changes suspicious for Barretts.  Path negative.  Pap 05/07/08 negative.  Mammogram 12/27/10 - BiRADS II.  Schedule mammogram.  Check cholesterol.  Bone density 05/13/08 revealed osteopenia with no significant change from previous.   Received labs from [facility] (date of labs 11/01/11).  CEA 1.3 - stable.  Follow.

## 2011-12-18 NOTE — Assessment & Plan Note (Signed)
Blood pressure under good control.  Same meds.  Follow metabolic panel.   

## 2011-12-18 NOTE — Assessment & Plan Note (Signed)
Low cholesterol diet.  On simvastatin.  Follow lipid panel and liver function.   

## 2011-12-18 NOTE — Assessment & Plan Note (Signed)
CEA checked 12/16/10 1.4.  Discussed with Dr Doylene Canning.  Rec repeat in six months.  Obtain last CEA from [place] [facility].  Colonoscopy 07/30/11 revealed internal hemorrhoids.  Rec follow up 07/2012.  Bowels doing better.  Follow.

## 2011-12-18 NOTE — Assessment & Plan Note (Addendum)
Bone density 2010 revealed osteopenia - no significant change.  Follow.  Continue calcium and vitamin D.  Vitamin D level 11/01/11 (42.5) normal.     

## 2012-01-03 ENCOUNTER — Encounter: Payer: Self-pay | Admitting: Internal Medicine

## 2012-01-18 ENCOUNTER — Ambulatory Visit: Payer: Self-pay | Admitting: Internal Medicine

## 2012-01-30 ENCOUNTER — Encounter: Payer: Self-pay | Admitting: Internal Medicine

## 2012-04-09 ENCOUNTER — Ambulatory Visit (INDEPENDENT_AMBULATORY_CARE_PROVIDER_SITE_OTHER): Payer: Medicare Other | Admitting: Internal Medicine

## 2012-04-09 ENCOUNTER — Encounter: Payer: Self-pay | Admitting: Internal Medicine

## 2012-04-09 VITALS — BP 128/70 | HR 59 | Temp 98.2°F | Ht 62.5 in

## 2012-04-09 DIAGNOSIS — M8580 Other specified disorders of bone density and structure, unspecified site: Secondary | ICD-10-CM

## 2012-04-09 DIAGNOSIS — G8929 Other chronic pain: Secondary | ICD-10-CM

## 2012-04-22 ENCOUNTER — Encounter: Payer: Self-pay | Admitting: Internal Medicine

## 2012-04-22 NOTE — Assessment & Plan Note (Signed)
Blood pressure under good control.  Same meds.  Follow metabolic panel.   

## 2012-04-22 NOTE — Assessment & Plan Note (Signed)
Has chronic back and leg pain.  Has had extensive w/up.  Now seeing Dr Chasnis.  He prescribes her pain meds.  Stable.  Follow.   

## 2012-04-22 NOTE — Assessment & Plan Note (Signed)
Last EGD 07/30/10 revealed hiatal hernia and some changes suspicious for Barretts.  Path negative.  Reflux under reasonable control.  Has follow up with Dr Markham Jordan tomorrow.

## 2012-04-22 NOTE — Assessment & Plan Note (Signed)
CEA checked 12/16/10 1.4.  Discussed with Dr Doylene Canning.  Rec repeat in six months.  Last CEA 9/13 - 1.3.  Stable.  Colonoscopy 07/30/11 revealed internal hemorrhoids.  Rec follow up 07/2012.  Bowels doing better.  Follow.

## 2012-04-22 NOTE — Assessment & Plan Note (Signed)
Low cholesterol diet.  On simvastatin.  Follow lipid panel and liver function.   

## 2012-04-22 NOTE — Progress Notes (Signed)
Subjective:    Patient ID: [name], female    DOB: [birth year], 79 y.o.   MRN: [id number]  HPI 79 year old female with past history of hypertension, hypercholesterolemia, colon cancer, Barretts esophagitis and chronic back and leg pain who comes in today to follow up on these issues as well as for a complete physical exam.  She states she is doing relatively well.  Seeing Dr Yves Dill for her back and legs.  Feels things are stable.  Still some reflux issues, but it appears symptoms are under reasonable control on her current regimen.  She did find out she was lactose intolerant and treatment for this has helped.  Due to see Dr Markham Jordan tomorrow.   Breathing stable.  Still occasional hot spells.  Saw Dr Renae Fickle.  W/up unrevealing.  On Estrogen and desire not to stop.  Discussed risk and side effects of long term estrogen therapy.  She elects to continue and not reduce the dose.  Overall she feels she is doing well.   Past Medical History  Diagnosis Date  . Colon cancer     adenocarcinoma, s/p resection x  2  . Bowel obstruction     s/p adhesion resection  . Recurrent sinus infections   . Vertigo   . Barrett's esophagus   . Hiatal hernia   . Hypercholesteremia   . Hypertension   . Chronic back pain   . Peripheral neuropathy      Current Outpatient Prescriptions on File Prior to Visit  Medication Sig Dispense Refill  . Calcium Carbonate-Vitamin D (CALCIUM 600 + D PO) Take 600 Units by mouth daily.       . Cholecalciferol (VITAMIN D3) 2000 UNITS TABS Take 2,000 Units by mouth daily.      Marland Kitchen estradiol (ESTRACE) 2 MG tablet Take 1 tablet (2 mg total) by mouth daily.  90 tablet  3  . FEXOFENADINE HCL PO Take 60 mg by mouth daily.      Marland Kitchen lactase (LACTAID) 3000 UNITS tablet Take 1 tablet by mouth as needed.      Marland Kitchen lisinopril-hydrochlorothiazide (PRINZIDE,ZESTORETIC) 10-12.5 MG per tablet Take 1 tablet by mouth daily.  90 tablet  3  . meclizine (ANTIVERT) 25 MG tablet Take 25 mg by mouth 3 (three)  times daily as needed.      . meloxicam (MOBIC) 7.5 MG tablet Take 7.5 mg by mouth daily.      . metoCLOPramide (REGLAN) 5 MG tablet Take 5 mg by mouth. Take one tablet three times a day      . metoprolol succinate (TOPROL-XL) 25 MG 24 hr tablet Take 25 mg by mouth daily.      Marland Kitchen oxyCODONE (OXY IR/ROXICODONE) 5 MG immediate release tablet Take 5 mg by mouth 2 (two) times daily as needed.       . pantoprazole (PROTONIX) 40 MG tablet Take 40 mg by mouth 2 (two) times daily.      . simvastatin (ZOCOR) 10 MG tablet Take 1 tablet (10 mg total) by mouth at bedtime.  90 tablet  3  . traMADol-acetaminophen (ULTRACET) 37.5-325 MG per tablet Take 1 tablet by mouth every 8 (eight) hours as needed.      Marland Kitchen estradiol (ESTRACE) 2 MG tablet Take 2 mg by mouth daily.       No current facility-administered medications on file prior to visit.    Review of Systems Patient denies any headache, lightheadedness or dizziness. No sinus or allergy symptoms.  No chest pain, tightness  or palpitations.  No increased shortness of breath, cough or congestion.  No nausea or vomiting.  No abdominal pain or cramping.  Acid reflux under reasonable control on her current regimen.  No significant bowel change, such as significant diarrhea, constipation, BRBPR or melana. Bowels are doing better.   No urine change.        Objective:   Physical Exam  Filed Vitals:   04/09/12 1329  BP: 128/70  Pulse: 59  Temp: 98.2 F (36.8 C)   Pulse 48  79 year old female in no acute distress.   HEENT:  Nares- clear.  Oropharynx - without lesions. NECK:  Supple.  Nontender.  No audible bruit.  HEART:  Appears to be regular. LUNGS:  No crackles or wheezing audible.  Respirations even and unlabored.  RADIAL PULSE:  Equal bilaterally.    BREASTS:  No nipple discharge or nipple retraction present.  Could not appreciate any distinct nodules or axillary adenopathy.  ABDOMEN:  Soft, nontender.  Bowel sounds present and normal.  No audible  abdominal bruit.  GU:  Normal external genitalia.  Vaginal vault without lesions.  S/p hysterectomy.  Could not appreciate any adnexal masses or tenderness.   RECTAL:  Heme negative.   EXTREMITIES:  No increased edema present.  DP pulses palpable and equal bilaterally.             Assessment & Plan:  HOT/FLUSHED FEELING.  Saw Dr Renae Fickle.  Had labs.  States everything checked out fine.  On estrogen.  Desires not to stop.  Better.    HEALTH MAINTENANCE.  Physical today.  Colonoscopy and EGD 07/30/10.  Colonoscopy revealed internal hemorrhoids.  EGD revealed hiatal hernia and some changes suspicious for Barretts.  Path negative.  Pap 05/07/08 negative.  Mammogram 01/28/12  - BiRADS I.   Check cholesterol.  Bone density 05/13/08 revealed osteopenia with no significant change from previous.   Received labs from [facility] (date of labs 11/01/11).  CEA 1.3 - stable.  Follow.

## 2012-04-22 NOTE — Assessment & Plan Note (Signed)
Bone density 2010 revealed osteopenia - no significant change.  Follow.  Continue calcium and vitamin D.  Vitamin D level 11/01/11 (42.5) normal.

## 2012-09-19 ENCOUNTER — Other Ambulatory Visit: Payer: Self-pay | Admitting: Internal Medicine

## 2012-10-08 ENCOUNTER — Encounter: Payer: Self-pay | Admitting: Internal Medicine

## 2012-10-08 ENCOUNTER — Ambulatory Visit (INDEPENDENT_AMBULATORY_CARE_PROVIDER_SITE_OTHER): Payer: Self-pay | Admitting: Internal Medicine

## 2012-10-08 VITALS — BP 110/60 | HR 65 | Temp 97.9°F | Ht 62.5 in | Wt 134.5 lb

## 2012-10-08 DIAGNOSIS — K2270 Barrett's esophagus without dysplasia: Secondary | ICD-10-CM

## 2012-10-08 DIAGNOSIS — E7800 Pure hypercholesterolemia, unspecified: Secondary | ICD-10-CM

## 2012-10-08 DIAGNOSIS — R351 Nocturia: Secondary | ICD-10-CM | POA: Insufficient documentation

## 2012-10-08 DIAGNOSIS — G8929 Other chronic pain: Secondary | ICD-10-CM

## 2012-10-08 DIAGNOSIS — R209 Unspecified disturbances of skin sensation: Secondary | ICD-10-CM

## 2012-10-08 DIAGNOSIS — M549 Dorsalgia, unspecified: Secondary | ICD-10-CM

## 2012-10-08 DIAGNOSIS — I10 Essential (primary) hypertension: Secondary | ICD-10-CM

## 2012-10-08 DIAGNOSIS — M899 Disorder of bone, unspecified: Secondary | ICD-10-CM

## 2012-10-08 DIAGNOSIS — C189 Malignant neoplasm of colon, unspecified: Secondary | ICD-10-CM

## 2012-10-08 DIAGNOSIS — M8580 Other specified disorders of bone density and structure, unspecified site: Secondary | ICD-10-CM

## 2012-10-08 DIAGNOSIS — R200 Anesthesia of skin: Secondary | ICD-10-CM

## 2012-10-08 LAB — URINALYSIS, ROUTINE W REFLEX MICROSCOPIC
Bilirubin Urine: NEGATIVE
Hgb urine dipstick: NEGATIVE
Leukocytes, UA: NEGATIVE
Nitrite: NEGATIVE
pH: 5.5 (ref 5.0–8.0)

## 2012-10-08 NOTE — Assessment & Plan Note (Signed)
Unclear etiology.  Question of neuropathy.  Question if originating from her back.  Check B12.  Discussed further w/up.  She wants to hold at this time.  Follow.

## 2012-10-08 NOTE — Assessment & Plan Note (Signed)
Has chronic back and leg pain.  Has had extensive w/up.  Now seeing Dr Chasnis.  He prescribes her pain meds.  Stable.  Follow.   

## 2012-10-08 NOTE — Assessment & Plan Note (Signed)
Bone density 2010 revealed osteopenia - no significant change.  Follow.  Continue calcium and vitamin D.  Vitamin D level 11/01/11 (42.5) normal.

## 2012-10-08 NOTE — Progress Notes (Signed)
Subjective:    Patient ID: [name], female    DOB: 1929-02-10, 79 y.o.   MRN: [id number]  HPI 79 year old female with past history of hypertension, hypercholesterolemia, colon cancer, Barretts esophagitis and chronic back and leg pain who comes in today for a scheduled follow up.  She states she is doing relatively well.  Seeing Dr Yves Dill for her back and legs.  Feels things are stable.  states if she takes protonix, reglan and watches what she eats, no problems with reflux.  She did find out she was lactose intolerant and treatment for this has helped.  Breathing stable.  On Estrogen and desires not to stop.  Have discussed risk and side effects of long term estrogen therapy.  She elects to continue and not reduce the dose.  Overall she feels she is doing well.  She does report that she will intermittently notice some numbness in both of her feet.  Notices in the am.  Once she is up and moving - resolves.  No problems moving her feet.  No pain.  Only involves her feet.  Does not involve her legs.     Past Medical History  Diagnosis Date  . Colon cancer     adenocarcinoma, s/p resection x  2  . Bowel obstruction     s/p adhesion resection  . Recurrent sinus infections   . Vertigo   . Barrett's esophagus   . Hiatal hernia   . Hypercholesteremia   . Hypertension   . Chronic back pain   . Peripheral neuropathy     Current Outpatient Prescriptions on File Prior to Visit  Medication Sig Dispense Refill  . Calcium Carbonate-Vitamin D (CALCIUM 600 + D PO) Take 600 Units by mouth daily.       . Cholecalciferol (VITAMIN D3) 2000 UNITS TABS Take 2,000 Units by mouth daily.      Marland Kitchen estradiol (ESTRACE) 2 MG tablet Take 1 tablet (2 mg total) by mouth daily.  90 tablet  3  . FEXOFENADINE HCL PO Take 60 mg by mouth daily.      Marland Kitchen lactase (LACTAID) 3000 UNITS tablet Take 1 tablet by mouth as needed.      . lidocaine (LIDODERM) 5 % Place 1 patch onto the skin as needed. Remove & Discard patch  within 12 hours or as directed by MD      . lisinopril-hydrochlorothiazide (PRINZIDE,ZESTORETIC) 10-12.5 MG per tablet Take 1 tablet by mouth daily.  90 tablet  3  . meclizine (ANTIVERT) 25 MG tablet Take 25 mg by mouth 3 (three) times daily as needed.      . meloxicam (MOBIC) 7.5 MG tablet Take 7.5 mg by mouth daily.      . metoCLOPramide (REGLAN) 5 MG tablet Take 5 mg by mouth. Take one tablet three times a day      . metoprolol succinate (TOPROL-XL) 25 MG 24 hr tablet TAKE 1 TABLET BY MOUTH EVERY DAY  90 tablet  1  . oxyCODONE (OXY IR/ROXICODONE) 5 MG immediate release tablet Take 5 mg by mouth 2 (two) times daily as needed.       . pantoprazole (PROTONIX) 40 MG tablet Take 40 mg by mouth 2 (two) times daily.      . simvastatin (ZOCOR) 10 MG tablet Take 1 tablet (10 mg total) by mouth at bedtime.  90 tablet  3  . traMADol-acetaminophen (ULTRACET) 37.5-325 MG per tablet Take 1 tablet by mouth every 8 (eight) hours as needed.  No current facility-administered medications on file prior to visit.    Review of Systems Patient denies any headache, lightheadedness or significant dizziness.  No sinus or allergy symptoms.  No chest pain, tightness or palpitations.  No increased shortness of breath, cough or congestion.  No nausea or vomiting.  No abdominal pain or cramping.  Acid reflux under reasonable control on her current regimen.  No significant bowel change, such as significant diarrhea, constipation, BRBPR or melana.  Bowels are doing better.  Does report some increased nocturia.  Some increased urinary frequency during the day.  Discussed treatment options.  Desires no further treatment at this time.         Objective:   Physical Exam  Filed Vitals:   10/08/12 1324  BP: 110/60  Pulse: 65  Temp: 97.9 F (36.6 C)   Blood pressure recheck:  132/68, pulse 67  79 year old female in no acute distress.   HEENT:  Nares- clear.  Oropharynx - without lesions. NECK:  Supple.  Nontender.   No audible bruit.  HEART:  Appears to be regular. LUNGS:  No crackles or wheezing audible.  Respirations even and unlabored.  RADIAL PULSE:  Equal bilaterally.    ABDOMEN:  Soft, nontender.  Bowel sounds present and normal.  No audible abdominal bruit.   EXTREMITIES:  No increased edema present.  DP pulses palpable and equal bilaterally.   No rash.           Assessment & Plan:  HOT/FLUSHED FEELING.  Saw Dr Renae Fickle.  Had labs.  States everything checked out fine.  On estrogen.  Desires not to stop.  Better.    HEALTH MAINTENANCE.  Physical 04/09/12.  Colonoscopy and EGD 07/30/10.  Colonoscopy revealed internal hemorrhoids.  EGD revealed hiatal hernia and some changes suspicious for Barretts.  Path negative.  Pap 05/07/08 negative.  Mammogram 01/28/12  - BiRADS I.   Bone density 05/13/08 revealed osteopenia with no significant change from previous.

## 2012-10-08 NOTE — Assessment & Plan Note (Signed)
CEA checked 12/16/10 1.4.  Discussed with Dr Doylene Canning.  Rec repeat in six months.  Last CEA 9/13 - 1.3.  Stable.  Colonoscopy 07/30/11 revealed internal hemorrhoids.  Rec follow up 07/2012.  Bowels doing better.  Follow.

## 2012-10-08 NOTE — Assessment & Plan Note (Signed)
Last EGD 07/30/10 revealed hiatal hernia and some changes suspicious for Barretts.  Path negative.  Reflux under reasonable control.   

## 2012-10-08 NOTE — Assessment & Plan Note (Signed)
Check urinalysis to confirm no infection.  Discussed treatment for overactive bladder.  She declines at this time.  Follow.  Notify me if she changes her mind.

## 2012-10-08 NOTE — Assessment & Plan Note (Signed)
Blood pressure under good control.  Same meds.  Follow metabolic panel.   

## 2012-10-08 NOTE — Assessment & Plan Note (Signed)
Low cholesterol diet.  On simvastatin.  Follow lipid panel and liver function.   

## 2012-10-16 ENCOUNTER — Telehealth: Payer: Self-pay | Admitting: Internal Medicine

## 2012-10-16 ENCOUNTER — Other Ambulatory Visit (INDEPENDENT_AMBULATORY_CARE_PROVIDER_SITE_OTHER): Payer: Medicare Other

## 2012-10-16 DIAGNOSIS — E871 Hypo-osmolality and hyponatremia: Secondary | ICD-10-CM

## 2012-10-16 DIAGNOSIS — R200 Anesthesia of skin: Secondary | ICD-10-CM

## 2012-10-16 DIAGNOSIS — I10 Essential (primary) hypertension: Secondary | ICD-10-CM

## 2012-10-16 DIAGNOSIS — R209 Unspecified disturbances of skin sensation: Secondary | ICD-10-CM

## 2012-10-16 DIAGNOSIS — E7800 Pure hypercholesterolemia, unspecified: Secondary | ICD-10-CM

## 2012-10-16 LAB — LIPID PANEL
Cholesterol: 146 mg/dL (ref 0–200)
HDL: 64.4 mg/dL (ref >39.00)
LDL Cholesterol: 60 mg/dL (ref 0–99)
Total CHOL/HDL Ratio: 2
Triglycerides: 106 mg/dL (ref 0.0–149.0)
VLDL: 21.2 mg/dL (ref 0.0–40.0)

## 2012-10-16 LAB — HEPATIC FUNCTION PANEL
ALT: 19 U/L (ref 0–35)
AST: 23 U/L (ref 0–37)
Bilirubin, Direct: 0 mg/dL (ref 0.0–0.3)
Total Bilirubin: 1 mg/dL (ref 0.3–1.2)

## 2012-10-16 LAB — BASIC METABOLIC PANEL
CO2: 26 mEq/L (ref 19–32)
Chloride: 101 mEq/L (ref 96–112)
Creatinine, Ser: 1.1 mg/dL (ref 0.4–1.2)
Potassium: 4 mEq/L (ref 3.5–5.1)
Sodium: 133 mEq/L — ABNORMAL LOW (ref 135–145)

## 2012-10-16 NOTE — Telephone Encounter (Signed)
Pt notified of lab results and need for f/u lab within one week.  Pt coming in 10/22/12 (at 2:30) for lab.  She is aware of appt.  Please just put on lab schedule.  Thanks.

## 2012-10-17 NOTE — Telephone Encounter (Signed)
Appointment made

## 2012-10-22 ENCOUNTER — Other Ambulatory Visit (INDEPENDENT_AMBULATORY_CARE_PROVIDER_SITE_OTHER): Payer: Medicare Other

## 2012-10-22 DIAGNOSIS — E871 Hypo-osmolality and hyponatremia: Secondary | ICD-10-CM

## 2012-10-22 LAB — BASIC METABOLIC PANEL
CO2: 26 mEq/L (ref 19–32)
Calcium: 9.2 mg/dL (ref 8.4–10.5)
Chloride: 106 mEq/L (ref 96–112)
Creatinine, Ser: 1 mg/dL (ref 0.4–1.2)
Glucose, Bld: 118 mg/dL — ABNORMAL HIGH (ref 70–99)
Sodium: 138 mEq/L (ref 135–145)

## 2012-10-23 ENCOUNTER — Encounter: Payer: Self-pay | Admitting: *Deleted

## 2012-11-29 ENCOUNTER — Telehealth: Payer: Self-pay | Admitting: Internal Medicine

## 2012-11-29 NOTE — Telephone Encounter (Signed)
Has she ever had a pneumonia shot.  If no, then yes I would recommend her to get Prevnar.  If she has had a pneumonia shot, I need to know when she had the injection.

## 2012-11-29 NOTE — Telephone Encounter (Signed)
Pt calling to see if she needs a pneumonia shot this year.  Please contact pt.

## 2012-11-29 NOTE — Telephone Encounter (Signed)
LMTCB

## 2012-11-29 NOTE — Telephone Encounter (Signed)
Pt states that she has had the pneumonia shot twice. The last time was at [facility]. I advised the patient to contact [facility] & have them fax over a copy of her immunizations to [place].

## 2012-12-04 ENCOUNTER — Encounter: Payer: Self-pay | Admitting: Internal Medicine

## 2012-12-10 NOTE — Telephone Encounter (Signed)
Left pt a voicemail to inform her that her pneumonia is up to date & she will not need another one (completed)

## 2012-12-10 NOTE — Telephone Encounter (Signed)
Pt called checking to see if she needs to get her pneumonia shot.  She stated she has record that she had shot 01/04/06.  Please advise pt. [phone]

## 2012-12-13 ENCOUNTER — Other Ambulatory Visit: Payer: Self-pay | Admitting: Internal Medicine

## 2012-12-22 ENCOUNTER — Other Ambulatory Visit: Payer: Self-pay | Admitting: Internal Medicine

## 2013-02-05 ENCOUNTER — Telehealth: Payer: Self-pay | Admitting: Internal Medicine

## 2013-02-05 NOTE — Telephone Encounter (Signed)
See below

## 2013-02-05 NOTE — Telephone Encounter (Signed)
Please review this and let me know what I need to do.

## 2013-02-05 NOTE — Telephone Encounter (Signed)
States pt insurance is changing to [employer] in the new year.  States she will need referrals put in place to:   1.  January 14 - Dr. Josem Kaufmann at [facility] for a place on her head that is being removed. 2.  Feburary 4 - Dr. Sher[facility] for tremors that she has been having.   3.  February 17 - Dr. Domingo Pulse for 6 mth f/u.   All appts have been scheduled.  Referral approval by Dr. Lorin Picket required by insurance.

## 2013-02-06 NOTE — Telephone Encounter (Signed)
[name], this will be our first run with referrals for [employer]. If the apt is already scheduled do we still go through [place]?

## 2013-02-08 ENCOUNTER — Ambulatory Visit: Payer: Self-pay | Admitting: Neurology

## 2013-02-12 NOTE — Telephone Encounter (Signed)
We will also need referrals for the following MD's and documentation/ progress note why they need to be seen. Dr. Purcell Nails, [facility]. Dr. Markham Jordan, [facility]. Dr Sherryll Burger, [facility].

## 2013-02-12 NOTE — Telephone Encounter (Signed)
Spoke with patient, making her aware that we need to copy of her insurance cards. Pt will bring it by on Friday 02/15/13

## 2013-02-18 NOTE — Telephone Encounter (Signed)
Referrals to [facility] are under way

## 2013-02-20 ENCOUNTER — Telehealth: Payer: Self-pay | Admitting: Internal Medicine

## 2013-02-20 ENCOUNTER — Other Ambulatory Visit: Payer: Self-pay | Admitting: *Deleted

## 2013-02-20 MED ORDER — SIMVASTATIN 10 MG PO TABS: ORAL_TABLET | ORAL | Status: DC

## 2013-02-20 MED ORDER — ESTRADIOL 2 MG PO TABS
2.0000 mg | ORAL_TABLET | Freq: Every day | ORAL | Status: DC
Start: 2013-02-20 — End: 2013-07-11

## 2013-02-20 MED ORDER — FEXOFENADINE HCL 60 MG PO TABS: 60.0000 mg | ORAL_TABLET | Freq: Every day | ORAL | Status: DC

## 2013-02-20 MED ORDER — METOPROLOL SUCCINATE ER 25 MG PO TB24: ORAL_TABLET | ORAL | Status: DC

## 2013-02-20 MED ORDER — LISINOPRIL-HYDROCHLOROTHIAZIDE 10-12.5 MG PO TABS: ORAL_TABLET | ORAL | Status: DC

## 2013-02-20 NOTE — Telephone Encounter (Signed)
Patient has [employer] and needs a print out for all of her medications to send for mail order.  She would like them for 90 days she needs the prescriptions for: Simvastatin 10 mg 1 tablet at bedtime, estradiol 2 mg 1 tab daily, metoprolol Succer 25 mg 1 daily, lisinopril HCTZ 10-12.5 mg tablet 1 daily, fexofenadine HCL 60 mg 1 tab daily.  Please advise patient when these are ready for her to pick up.

## 2013-02-20 NOTE — Telephone Encounter (Signed)
Sent Rx electronically to [facility] & pt notified

## 2013-02-25 ENCOUNTER — Telehealth: Payer: Self-pay | Admitting: Emergency Medicine

## 2013-02-25 NOTE — Telephone Encounter (Signed)
Pt has been approved to see [place], [place] with 4 visits exp 05/21/13. Auth # [id number]

## 2013-02-27 NOTE — Telephone Encounter (Signed)
Cindy @ [facility] aware of the auth #

## 2013-02-27 NOTE — Telephone Encounter (Signed)
Rec'd VM from [place] in reference to pt referral. LVM for their office to return my call.

## 2013-03-06 NOTE — Telephone Encounter (Signed)
[facility] approved pt to see Dr. Tiffany Kocher with 4 visits,exp 05/21/13, Auth # [id number] [facility] approved pt to see Dr. Melrose Nakayama with 4 visits, exp 05/21/13, Auth # [id number]

## 2013-03-07 DIAGNOSIS — C4491 Basal cell carcinoma of skin, unspecified: Secondary | ICD-10-CM

## 2013-03-07 HISTORY — DX: Basal cell carcinoma of skin, unspecified: C44.91

## 2013-04-09 ENCOUNTER — Encounter: Payer: Medicare Other | Admitting: Internal Medicine

## 2013-05-07 ENCOUNTER — Ambulatory Visit (INDEPENDENT_AMBULATORY_CARE_PROVIDER_SITE_OTHER): Payer: Commercial Managed Care - HMO | Admitting: Internal Medicine

## 2013-05-07 ENCOUNTER — Encounter: Payer: Self-pay | Admitting: Internal Medicine

## 2013-05-07 VITALS — BP 110/70 | HR 56 | Temp 97.9°F | Ht 60.0 in | Wt 134.5 lb

## 2013-05-07 DIAGNOSIS — E7800 Pure hypercholesterolemia, unspecified: Secondary | ICD-10-CM

## 2013-05-07 DIAGNOSIS — I10 Essential (primary) hypertension: Secondary | ICD-10-CM

## 2013-05-07 DIAGNOSIS — C189 Malignant neoplasm of colon, unspecified: Secondary | ICD-10-CM

## 2013-05-07 DIAGNOSIS — M949 Disorder of cartilage, unspecified: Secondary | ICD-10-CM

## 2013-05-07 DIAGNOSIS — Z1239 Encounter for other screening for malignant neoplasm of breast: Secondary | ICD-10-CM

## 2013-05-07 DIAGNOSIS — Z23 Encounter for immunization: Secondary | ICD-10-CM

## 2013-05-07 DIAGNOSIS — M549 Dorsalgia, unspecified: Secondary | ICD-10-CM

## 2013-05-07 DIAGNOSIS — G8929 Other chronic pain: Secondary | ICD-10-CM

## 2013-05-07 DIAGNOSIS — K2270 Barrett's esophagus without dysplasia: Secondary | ICD-10-CM

## 2013-05-07 DIAGNOSIS — M8580 Other specified disorders of bone density and structure, unspecified site: Secondary | ICD-10-CM

## 2013-05-07 DIAGNOSIS — M899 Disorder of bone, unspecified: Secondary | ICD-10-CM

## 2013-05-07 NOTE — Progress Notes (Signed)
Subjective:    Patient ID: [name], female    DOB: 21-Dec-1928, 81 y.o.   MRN: [id number]  HPI 80 year old female with past history of hypertension, hypercholesterolemia, colon cancer, Barretts esophagitis and chronic back and leg pain who comes in today to follow up on these issues as well as for a complete physical exam.  She states she is doing relatively well.  Seeing Dr Sharlet Salina for her back and legs.  Feels things are stable.  states if she takes protonix, reglan and watches what she eats, no problems with reflux.  She did find out she was lactose intolerant and treatment for this has helped.  Breathing stable.  On Estrogen and desires not to stop.  Have discussed risk and side effects of long term estrogen therapy.  She elects to continue and not reduce the dose.  Overall she feels she is doing well.       Past Medical History  Diagnosis Date  . Colon cancer     adenocarcinoma, s/p resection x  2  . Bowel obstruction     s/p adhesion resection  . Recurrent sinus infections   . Vertigo   . Barrett's esophagus   . Hiatal hernia   . Hypercholesteremia   . Hypertension   . Chronic back pain   . Peripheral neuropathy     Current Outpatient Prescriptions on File Prior to Visit  Medication Sig Dispense Refill  . Calcium Carbonate-Vitamin D (CALCIUM 600 + D PO) Take 600 Units by mouth daily.       . Cholecalciferol (VITAMIN D3) 2000 UNITS TABS Take 2,000 Units by mouth daily.      Marland Kitchen estradiol (ESTRACE) 2 MG tablet Take 1 tablet (2 mg total) by mouth daily.  90 tablet  1  . fexofenadine (ALLEGRA) 60 MG tablet Take 1 tablet (60 mg total) by mouth daily.  90 tablet  3  . fish oil-omega-3 fatty acids 1000 MG capsule Take 2 g by mouth daily.      Marland Kitchen lactase (LACTAID) 3000 UNITS tablet Take 1 tablet by mouth as needed.      . lidocaine (LIDODERM) 5 % Place 1 patch onto the skin as needed. Remove & Discard patch within 12 hours or as directed by MD      . lisinopril-hydrochlorothiazide  (PRINZIDE,ZESTORETIC) 10-12.5 MG per tablet TAKE 1 TABLET BY MOUTH ONCE A DAY  90 tablet  1  . meclizine (ANTIVERT) 25 MG tablet Take 25 mg by mouth 3 (three) times daily as needed.      . meloxicam (MOBIC) 7.5 MG tablet Take 7.5 mg by mouth daily.      . metoCLOPramide (REGLAN) 5 MG tablet Take 5 mg by mouth. Take one tablet three times a day      . metoprolol succinate (TOPROL-XL) 25 MG 24 hr tablet TAKE 1 TABLET BY MOUTH EVERY DAY  90 tablet  1  . oxyCODONE (OXY IR/ROXICODONE) 5 MG immediate release tablet Take 5 mg by mouth 2 (two) times daily as needed.       . pantoprazole (PROTONIX) 40 MG tablet Take 40 mg by mouth 2 (two) times daily.      . simvastatin (ZOCOR) 10 MG tablet TAKE 1 TABLET AT BEDTIME  90 tablet  1  . traMADol-acetaminophen (ULTRACET) 37.5-325 MG per tablet Take 1 tablet by mouth every 8 (eight) hours as needed.       No current facility-administered medications on file prior to visit.  Review of Systems Patient denies any headache, lightheadedness or dizziness.  No sinus or allergy symptoms.  No chest pain, tightness or palpitations.  No increased shortness of breath, cough or congestion.  No nausea or vomiting.  No abdominal pain or cramping.  Acid reflux under reasonable control on her current regimen.  No significant bowel change, such as significant diarrhea, constipation, BRBPR or melana.  Bowels are doing better.          Objective:   Physical Exam  Filed Vitals:   05/07/13 1431  BP: 110/70  Pulse: 56  Temp: 97.9 F (86.110 C)   80 year old female in no acute distress.   HEENT:  Nares- clear.  Oropharynx - without lesions. NECK:  Supple.  Nontender.  No audible bruit.  HEART:  Appears to be regular. LUNGS:  No crackles or wheezing audible.  Respirations even and unlabored.  RADIAL PULSE:  Equal bilaterally.    BREASTS:  No nipple discharge or nipple retraction present.  Could not appreciate any distinct nodules or axillary adenopathy.  ABDOMEN:  Soft,  nontender.  Bowel sounds present and normal.  No audible abdominal bruit.  GU:  Not performed.     EXTREMITIES:  No increased edema present.  DP pulses palpable and equal bilaterally.           Assessment & Plan:  HOT/FLUSHED FEELING.  Saw Dr Eddie Dibbles.  Had labs.  States everything checked out fine.  On estrogen.  Desires not to stop.  Better.    HEALTH MAINTENANCE.  Physical today.  Colonoscopy and EGD 07/30/10.  Colonoscopy revealed internal hemorrhoids.  EGD revealed hiatal hernia and some changes suspicious for Barretts.  Path negative.  Pap 05/07/08 negative.  Mammogram 01/28/12  - BiRADS I.   Bone density 05/13/08 revealed osteopenia with no significant change from previous. Overdue f/u mammogram.  Schedule.     I spent 25 minutes with the patient and more than 50% of the time was spent in consultation regarding the above.

## 2013-05-07 NOTE — Progress Notes (Signed)
Pre-visit discussion using our clinic review tool. No additional management support is needed unless otherwise documented below in the visit note.  

## 2013-05-11 ENCOUNTER — Encounter: Payer: Self-pay | Admitting: Internal Medicine

## 2013-05-11 NOTE — Assessment & Plan Note (Signed)
Low cholesterol diet.  On simvastatin.  Follow lipid panel and liver function.   

## 2013-05-11 NOTE — Assessment & Plan Note (Addendum)
CEA checked 12/16/10 1.4.  Discussed with Dr Oliva Bustard.  Rec repeat in six months.  Last CEA 9/13 - 1.3.  Stable.  Colonoscopy 07/30/11 revealed internal hemorrhoids.  Just saw GI two weeks ago.  Dr Tiffany Kocher felt things were stable.

## 2013-05-11 NOTE — Assessment & Plan Note (Signed)
Bone density 2010 revealed osteopenia - no significant change.  Follow.  Continue calcium and vitamin D.

## 2013-05-11 NOTE — Assessment & Plan Note (Signed)
Has chronic back and leg pain.  Has had extensive w/up.  Now seeing Dr Sharlet Salina.  He prescribes her pain meds.  Stable.  Follow.

## 2013-05-11 NOTE — Assessment & Plan Note (Signed)
Last EGD 07/30/10 revealed hiatal hernia and some changes suspicious for Barretts.  Path negative.  Reflux under reasonable control.   

## 2013-05-11 NOTE — Assessment & Plan Note (Signed)
Blood pressure under good control.  Same meds.  Follow metabolic panel.

## 2013-05-21 ENCOUNTER — Other Ambulatory Visit (INDEPENDENT_AMBULATORY_CARE_PROVIDER_SITE_OTHER): Payer: Commercial Managed Care - HMO

## 2013-05-21 DIAGNOSIS — C189 Malignant neoplasm of colon, unspecified: Secondary | ICD-10-CM

## 2013-05-21 DIAGNOSIS — M899 Disorder of bone, unspecified: Secondary | ICD-10-CM | POA: Diagnosis not present

## 2013-05-21 DIAGNOSIS — K2270 Barrett's esophagus without dysplasia: Secondary | ICD-10-CM | POA: Diagnosis not present

## 2013-05-21 DIAGNOSIS — I10 Essential (primary) hypertension: Secondary | ICD-10-CM | POA: Diagnosis not present

## 2013-05-21 DIAGNOSIS — M8580 Other specified disorders of bone density and structure, unspecified site: Secondary | ICD-10-CM

## 2013-05-21 DIAGNOSIS — M949 Disorder of cartilage, unspecified: Secondary | ICD-10-CM

## 2013-05-21 DIAGNOSIS — E7800 Pure hypercholesterolemia, unspecified: Secondary | ICD-10-CM | POA: Diagnosis not present

## 2013-05-21 LAB — BASIC METABOLIC PANEL
BUN: 19 mg/dL (ref 6–23)
CALCIUM: 9.4 mg/dL (ref 8.4–10.5)
CO2: 26 mEq/L (ref 19–32)
Chloride: 103 mEq/L (ref 96–112)
Creatinine, Ser: 0.9 mg/dL (ref 0.4–1.2)
GFR: 67.1 mL/min (ref >60.00)
Glucose, Bld: 90 mg/dL (ref 70–99)
Potassium: 3.8 mEq/L (ref 3.5–5.1)
Sodium: 138 mEq/L (ref 135–145)

## 2013-05-21 LAB — LIPID PANEL
Cholesterol: 158 mg/dL (ref 0–200)
HDL: 73.8 mg/dL (ref >39.00)
LDL Cholesterol: 59 mg/dL (ref 0–99)
Total CHOL/HDL Ratio: 2
Triglycerides: 126 mg/dL (ref 0.0–149.0)
VLDL: 25.2 mg/dL (ref 0.0–40.0)

## 2013-05-21 LAB — CBC WITH DIFFERENTIAL/PLATELET
BASOS ABS: 0.1 10*3/uL (ref 0.0–0.1)
BASOS PCT: 1.1 % (ref 0.0–3.0)
EOS ABS: 0.4 10*3/uL (ref 0.0–0.7)
Eosinophils Relative: 7.9 % — ABNORMAL HIGH (ref 0.0–5.0)
HCT: 38 % (ref 36.0–46.0)
Hemoglobin: 13 g/dL (ref 12.0–15.0)
LYMPHS PCT: 32.9 % (ref 12.0–46.0)
Lymphs Abs: 1.6 10*3/uL (ref 0.7–4.0)
MCHC: 34.2 g/dL (ref 30.0–36.0)
MCV: 94.4 fl (ref 78.0–100.0)
MONO ABS: 0.4 10*3/uL (ref 0.1–1.0)
Monocytes Relative: 8.2 % (ref 3.0–12.0)
Neutro Abs: 2.4 10*3/uL (ref 1.4–7.7)
Neutrophils Relative %: 49.9 % (ref 43.0–77.0)
Platelets: 209 10*3/uL (ref 150.0–400.0)
RBC: 4.02 Mil/uL (ref 3.87–5.11)
RDW: 13.8 % (ref 11.5–14.6)
WBC: 4.8 10*3/uL (ref 4.5–10.5)

## 2013-05-21 LAB — HEPATIC FUNCTION PANEL
ALT: 17 U/L (ref 0–35)
AST: 23 U/L (ref 0–37)
Albumin: 4 g/dL (ref 3.5–5.2)
Alkaline Phosphatase: 42 U/L (ref 39–117)
BILIRUBIN TOTAL: 1.3 mg/dL — AB (ref 0.3–1.2)
Bilirubin, Direct: 0.2 mg/dL (ref 0.0–0.3)
Total Protein: 6.6 g/dL (ref 6.0–8.3)

## 2013-05-21 LAB — TSH: TSH: 1.75 u[IU]/mL (ref 0.35–5.50)

## 2013-05-22 ENCOUNTER — Other Ambulatory Visit: Payer: Self-pay | Admitting: Internal Medicine

## 2013-05-22 ENCOUNTER — Encounter: Payer: Self-pay | Admitting: *Deleted

## 2013-05-22 LAB — CEA: CEA: 0.8 ng/mL (ref 0.0–5.0)

## 2013-05-22 NOTE — Progress Notes (Signed)
Order placed for f/u liver panel.  

## 2013-05-28 ENCOUNTER — Telehealth: Payer: Self-pay | Admitting: Emergency Medicine

## 2013-05-28 NOTE — Telephone Encounter (Signed)
Pt was approved to see [facility] with 4 visits exp 08/26/13. auth # [id number]

## 2013-05-28 NOTE — Telephone Encounter (Signed)
Referral underway to [facility] for approval for pt to see [facility].

## 2013-06-13 ENCOUNTER — Other Ambulatory Visit (INDEPENDENT_AMBULATORY_CARE_PROVIDER_SITE_OTHER): Payer: Medicare HMO

## 2013-06-13 ENCOUNTER — Ambulatory Visit: Payer: Self-pay | Admitting: Internal Medicine

## 2013-06-13 ENCOUNTER — Other Ambulatory Visit: Payer: Medicare HMO

## 2013-06-13 DIAGNOSIS — R17 Unspecified jaundice: Secondary | ICD-10-CM

## 2013-06-13 LAB — HEPATIC FUNCTION PANEL
ALK PHOS: 45 U/L (ref 39–117)
ALT: 21 U/L (ref 0–35)
AST: 23 U/L (ref 0–37)
Albumin: 4 g/dL (ref 3.5–5.2)
Bilirubin, Direct: 0.1 mg/dL (ref 0.0–0.3)
TOTAL PROTEIN: 6.9 g/dL (ref 6.0–8.3)
Total Bilirubin: 1.8 mg/dL — ABNORMAL HIGH (ref 0.3–1.2)

## 2013-06-13 LAB — HM MAMMOGRAPHY: HM Mammogram: NEGATIVE

## 2013-06-14 ENCOUNTER — Telehealth: Payer: Self-pay | Admitting: *Deleted

## 2013-06-14 ENCOUNTER — Encounter: Payer: Self-pay | Admitting: Internal Medicine

## 2013-06-14 NOTE — Telephone Encounter (Signed)
Left detailed message on home voicemail (per DPR) to inform pt the BP readings were received & reviewed by Dr. Nicki Reaper. BP readings are okay. Continue to monitor.

## 2013-06-17 ENCOUNTER — Telehealth: Payer: Self-pay | Admitting: Internal Medicine

## 2013-06-17 NOTE — Telephone Encounter (Signed)
Pt notified of persistent elevated bilirubin.  Agreed to ultrasound.  Order placed for ultrasound.

## 2013-06-20 NOTE — Telephone Encounter (Signed)
Pt called so we can continue referral process for further visits if necessary.  Pt states per her first visit with Dr. Sharlet Salina she is planned to go once every three months.  Advised pt to keep [place] informed of her scheduled visits so that we can continue the referral process if necessary.  Pt agrees.

## 2013-06-25 ENCOUNTER — Ambulatory Visit: Payer: Self-pay | Admitting: Internal Medicine

## 2013-06-26 ENCOUNTER — Telehealth: Payer: Self-pay | Admitting: Internal Medicine

## 2013-06-26 NOTE — Telephone Encounter (Signed)
Pt notified of abdominal ultrasound results.  Radiology recommended MRCP.  After discussion with pt, will refer to GI for further evaluation and see if MRCP warranted.  To GI for further evaluation of hyperbilirubinemia and dilated duct.  Order for referral to GI placed.  Pt is aware of need for f/u lab appt.  She is coming in 07/04/13 at 11:00.  Please put on lab schedule.  Pt aware of lab appt.

## 2013-06-27 NOTE — Telephone Encounter (Signed)
Lab appt scheduled.

## 2013-06-28 ENCOUNTER — Telehealth: Payer: Self-pay | Admitting: *Deleted

## 2013-06-28 NOTE — Telephone Encounter (Signed)
Pt is coming in Wed. What labs and dx? 

## 2013-07-01 NOTE — Telephone Encounter (Signed)
Order placed for f/u labs.  

## 2013-07-04 ENCOUNTER — Other Ambulatory Visit (INDEPENDENT_AMBULATORY_CARE_PROVIDER_SITE_OTHER): Payer: Medicare HMO

## 2013-07-04 DIAGNOSIS — R17 Unspecified jaundice: Secondary | ICD-10-CM

## 2013-07-04 LAB — HEPATIC FUNCTION PANEL
ALBUMIN: 3.7 g/dL (ref 3.5–5.2)
ALT: 30 U/L (ref 0–35)
AST: 29 U/L (ref 0–37)
Alkaline Phosphatase: 46 U/L (ref 39–117)
BILIRUBIN TOTAL: 0.9 mg/dL (ref 0.2–1.2)
Bilirubin, Direct: 0.1 mg/dL (ref 0.0–0.3)
TOTAL PROTEIN: 6.6 g/dL (ref 6.0–8.3)

## 2013-07-05 ENCOUNTER — Encounter: Payer: Self-pay | Admitting: *Deleted

## 2013-07-11 ENCOUNTER — Other Ambulatory Visit: Payer: Self-pay | Admitting: *Deleted

## 2013-07-11 MED ORDER — LISINOPRIL-HYDROCHLOROTHIAZIDE 10-12.5 MG PO TABS: ORAL_TABLET | ORAL | Status: DC

## 2013-07-11 MED ORDER — ESTRADIOL 2 MG PO TABS: 2.0000 mg | ORAL_TABLET | Freq: Every day | ORAL | Status: DC

## 2013-07-11 MED ORDER — SIMVASTATIN 10 MG PO TABS: ORAL_TABLET | ORAL | Status: DC

## 2013-07-12 ENCOUNTER — Encounter: Payer: Self-pay | Admitting: Internal Medicine

## 2013-08-15 ENCOUNTER — Telehealth: Payer: Self-pay | Admitting: *Deleted

## 2013-08-15 MED ORDER — METOPROLOL SUCCINATE ER 25 MG PO TB24: ORAL_TABLET | ORAL | Status: DC

## 2013-08-15 NOTE — Telephone Encounter (Signed)
Rx sent to pharmacy by escript  

## 2013-08-21 ENCOUNTER — Telehealth: Payer: Self-pay | Admitting: Internal Medicine

## 2013-08-21 ENCOUNTER — Other Ambulatory Visit: Payer: Self-pay | Admitting: *Deleted

## 2013-08-21 MED ORDER — METOPROLOL SUCCINATE ER 25 MG PO TB24: ORAL_TABLET | ORAL | Status: DC

## 2013-08-21 NOTE — Telephone Encounter (Signed)
duplicate

## 2013-08-21 NOTE — Telephone Encounter (Signed)
metoprolol succinate (TOPROL-XL) 25 MG 24 hr tablet #90

## 2013-08-21 NOTE — Telephone Encounter (Signed)
Rx sent to pharmacy   

## 2013-08-21 NOTE — Telephone Encounter (Signed)
metoprolol succinate (TOPROL-XL) 25 MG 24 hr tablet  #90

## 2013-09-10 ENCOUNTER — Ambulatory Visit (INDEPENDENT_AMBULATORY_CARE_PROVIDER_SITE_OTHER): Payer: Commercial Managed Care - HMO | Admitting: Internal Medicine

## 2013-09-10 ENCOUNTER — Encounter: Payer: Self-pay | Admitting: Internal Medicine

## 2013-09-10 VITALS — BP 122/70 | HR 66 | Temp 97.9°F | Ht 60.0 in | Wt 131.5 lb

## 2013-09-10 DIAGNOSIS — I10 Essential (primary) hypertension: Secondary | ICD-10-CM

## 2013-09-10 DIAGNOSIS — M899 Disorder of bone, unspecified: Secondary | ICD-10-CM

## 2013-09-10 DIAGNOSIS — M549 Dorsalgia, unspecified: Secondary | ICD-10-CM

## 2013-09-10 DIAGNOSIS — M949 Disorder of cartilage, unspecified: Secondary | ICD-10-CM

## 2013-09-10 DIAGNOSIS — M8580 Other specified disorders of bone density and structure, unspecified site: Secondary | ICD-10-CM

## 2013-09-10 DIAGNOSIS — C189 Malignant neoplasm of colon, unspecified: Secondary | ICD-10-CM

## 2013-09-10 DIAGNOSIS — K2270 Barrett's esophagus without dysplasia: Secondary | ICD-10-CM

## 2013-09-10 DIAGNOSIS — E7800 Pure hypercholesterolemia, unspecified: Secondary | ICD-10-CM

## 2013-09-10 DIAGNOSIS — G8929 Other chronic pain: Secondary | ICD-10-CM

## 2013-09-10 NOTE — Progress Notes (Signed)
Pre visit review using our clinic review tool, if applicable. No additional management support is needed unless otherwise documented below in the visit note. 

## 2013-09-14 ENCOUNTER — Encounter: Payer: Self-pay | Admitting: Internal Medicine

## 2013-09-14 NOTE — Assessment & Plan Note (Signed)
Bone density 2010 revealed osteopenia - no significant change.  Follow.  Continue calcium and vitamin D.  Need f/u bone density.

## 2013-09-14 NOTE — Assessment & Plan Note (Addendum)
CEA checked 12/16/10 1.4.  Discussed with Dr Oliva Bustard.  Rec repeat in six months.  Last CEA 9/13 - 1.3.  Stable.  Colonoscopy 07/30/11 revealed internal hemorrhoids.  Just saw GI two weeks ago.  Dr Tiffany Kocher felt things were stable.  CEA 05/31/13 - .8.

## 2013-09-14 NOTE — Assessment & Plan Note (Signed)
Low cholesterol diet.  On simvastatin.  Follow lipid panel and liver function.

## 2013-09-14 NOTE — Assessment & Plan Note (Signed)
Last EGD 07/30/10 revealed hiatal hernia and some changes suspicious for Barretts.  Path negative.  Reflux under reasonable control.

## 2013-09-14 NOTE — Progress Notes (Signed)
Subjective:    Patient ID: [name], female    DOB: January 05, 1929, 80 y.o.   MRN: [id number]  HPI 80 year old female with past history of hypertension, hypercholesterolemia, colon cancer, Barretts esophagitis and chronic back and leg pain who comes in today for a scheduled follow up.  She states she is doing relatively well.  Seeing Dr Sharlet Salina for her back and legs.  Feels things are stable.  states if she takes protonix, reglan and watches what she eats, no problems with reflux.  She did find out she was lactose intolerant and treatment for this has helped.  Breathing stable.  On Estrogen and desires not to stop.  Have discussed risk and side effects of long term estrogen therapy.  She elects to continue and not reduce the dose.  Overall she feels she is doing well.  Blood pressure up some.      Past Medical History  Diagnosis Date  . Colon cancer     adenocarcinoma, s/p resection x  2  . Bowel obstruction     s/p adhesion resection  . Recurrent sinus infections   . Vertigo   . Barrett's esophagus   . Hiatal hernia   . Hypercholesteremia   . Hypertension   . Chronic back pain   . Peripheral neuropathy     Current Outpatient Prescriptions on File Prior to Visit  Medication Sig Dispense Refill  . Calcium Carbonate-Vitamin D (CALCIUM 600 + D PO) Take 600 Units by mouth daily.       . Cholecalciferol (VITAMIN D3) 2000 UNITS TABS Take 2,000 Units by mouth daily.      Marland Kitchen estradiol (ESTRACE) 2 MG tablet Take 1 tablet (2 mg total) by mouth daily.  90 tablet  1  . fexofenadine (ALLEGRA) 60 MG tablet Take 1 tablet (60 mg total) by mouth daily.  90 tablet  3  . fish oil-omega-3 fatty acids 1000 MG capsule Take 2 g by mouth daily.      Marland Kitchen lactase (LACTAID) 3000 UNITS tablet Take 1 tablet by mouth as needed.      . lidocaine (LIDODERM) 5 % Place 1 patch onto the skin as needed. Remove & Discard patch within 12 hours or as directed by MD      . lisinopril-hydrochlorothiazide  (PRINZIDE,ZESTORETIC) 10-12.5 MG per tablet TAKE 1 TABLET BY MOUTH ONCE A DAY  90 tablet  1  . meclizine (ANTIVERT) 25 MG tablet Take 25 mg by mouth 3 (three) times daily as needed.      . meloxicam (MOBIC) 7.5 MG tablet Take 7.5 mg by mouth daily.      . metoCLOPramide (REGLAN) 5 MG tablet Take 5 mg by mouth. Take one tablet three times a day      . metoprolol succinate (TOPROL-XL) 25 MG 24 hr tablet TAKE 1 TABLET BY MOUTH EVERY DAY  90 tablet  0  . oxyCODONE (OXY IR/ROXICODONE) 5 MG immediate release tablet Take 5 mg by mouth 2 (two) times daily as needed.       . pantoprazole (PROTONIX) 40 MG tablet Take 40 mg by mouth 2 (two) times daily.      . simvastatin (ZOCOR) 10 MG tablet TAKE 1 TABLET AT BEDTIME  90 tablet  1  . traMADol-acetaminophen (ULTRACET) 37.5-325 MG per tablet Take 1 tablet by mouth every 8 (eight) hours as needed.       No current facility-administered medications on file prior to visit.    Review of Systems Patient  denies any headache, lightheadedness or dizziness.  No sinus or allergy symptoms.  No chest pain, tightness or palpitations.  No increased shortness of breath, cough or congestion.  No nausea or vomiting.  No abdominal pain or cramping.  Acid reflux under reasonable control on her current regimen.  No significant bowel change, such as significant diarrhea, constipation, BRBPR or melana.  Bowels are doing better.          Objective:   Physical Exam  Filed Vitals:   09/10/13 1058  BP: 122/70  Pulse: 66  Temp: 97.9 F (36.6 C)   Blood pressure recheck:  65/42  80 year old female in no acute distress.   HEENT:  Nares- clear.  Oropharynx - without lesions. NECK:  Supple.  Nontender.  No audible bruit.  HEART:  Appears to be regular. LUNGS:  No crackles or wheezing audible.  Respirations even and unlabored.  RADIAL PULSE:  Equal bilaterally.   ABDOMEN:  Soft, nontender.  Bowel sounds present and normal.  No audible abdominal bruit.     EXTREMITIES:  No  increased edema present.  DP pulses palpable and equal bilaterally.           Assessment & Plan:  HOT/FLUSHED FEELING.  Saw Dr Eddie Dibbles.  Had labs.  States everything checked out fine.  On estrogen.  Desires not to stop.  Better.    HEALTH MAINTENANCE.  Physical 05/07/13.  Colonoscopy and EGD 07/30/10.  Colonoscopy revealed internal hemorrhoids.  EGD revealed hiatal hernia and some changes suspicious for Barretts.  Path negative.  Pap 05/07/08 negative.  Mammogram 06/13/13  - BiRADS I.   Bone density 05/13/08 revealed osteopenia with no significant change from previous.     I spent 25 minutes with the patient and more than 50% of the time was spent in consultation regarding the above.

## 2013-09-14 NOTE — Assessment & Plan Note (Signed)
Has chronic back and leg pain.  Has had extensive w/up.  Now seeing Dr Sharlet Salina.  He prescribes her pain meds.  Stable.  Follow.  Has f/u appt 09/24/13.

## 2013-09-14 NOTE — Assessment & Plan Note (Signed)
Blood pressure under good control.  Same meds.  Follow metabolic panel.

## 2013-10-24 ENCOUNTER — Other Ambulatory Visit (INDEPENDENT_AMBULATORY_CARE_PROVIDER_SITE_OTHER): Payer: Commercial Managed Care - HMO

## 2013-10-24 DIAGNOSIS — I10 Essential (primary) hypertension: Secondary | ICD-10-CM

## 2013-10-24 DIAGNOSIS — E7800 Pure hypercholesterolemia, unspecified: Secondary | ICD-10-CM

## 2013-10-24 LAB — BASIC METABOLIC PANEL
BUN: 23 mg/dL (ref 6–23)
CHLORIDE: 102 meq/L (ref 96–112)
CO2: 29 mEq/L (ref 19–32)
CREATININE: 1 mg/dL (ref 0.4–1.2)
Calcium: 9.7 mg/dL (ref 8.4–10.5)
GFR: 53.83 mL/min — ABNORMAL LOW (ref >60.00)
Glucose, Bld: 79 mg/dL (ref 70–99)
Potassium: 3.5 mEq/L (ref 3.5–5.1)
Sodium: 137 mEq/L (ref 135–145)

## 2013-10-24 LAB — HEPATIC FUNCTION PANEL
ALK PHOS: 44 U/L (ref 39–117)
ALT: 21 U/L (ref 0–35)
AST: 23 U/L (ref 0–37)
Albumin: 3.8 g/dL (ref 3.5–5.2)
BILIRUBIN DIRECT: 0 mg/dL (ref 0.0–0.3)
Total Bilirubin: 1.2 mg/dL (ref 0.2–1.2)
Total Protein: 6.8 g/dL (ref 6.0–8.3)

## 2013-10-24 LAB — LIPID PANEL
Cholesterol: 163 mg/dL (ref 0–200)
HDL: 69.3 mg/dL (ref >39.00)
LDL CALC: 75 mg/dL (ref 0–99)
NonHDL: 93.7
TRIGLYCERIDES: 96 mg/dL (ref 0.0–149.0)
Total CHOL/HDL Ratio: 2
VLDL: 19.2 mg/dL (ref 0.0–40.0)

## 2013-10-25 ENCOUNTER — Encounter: Payer: Self-pay | Admitting: *Deleted

## 2013-10-29 ENCOUNTER — Other Ambulatory Visit: Payer: Medicare HMO

## 2013-10-31 ENCOUNTER — Ambulatory Visit (INDEPENDENT_AMBULATORY_CARE_PROVIDER_SITE_OTHER): Payer: Commercial Managed Care - HMO | Admitting: Internal Medicine

## 2013-10-31 ENCOUNTER — Encounter: Payer: Self-pay | Admitting: Internal Medicine

## 2013-10-31 VITALS — BP 122/70 | HR 62 | Temp 98.4°F | Ht 60.0 in | Wt 133.0 lb

## 2013-10-31 DIAGNOSIS — M899 Disorder of bone, unspecified: Secondary | ICD-10-CM

## 2013-10-31 DIAGNOSIS — M8580 Other specified disorders of bone density and structure, unspecified site: Secondary | ICD-10-CM

## 2013-10-31 DIAGNOSIS — E7800 Pure hypercholesterolemia, unspecified: Secondary | ICD-10-CM

## 2013-10-31 DIAGNOSIS — L989 Disorder of the skin and subcutaneous tissue, unspecified: Secondary | ICD-10-CM

## 2013-10-31 DIAGNOSIS — M549 Dorsalgia, unspecified: Secondary | ICD-10-CM

## 2013-10-31 DIAGNOSIS — I10 Essential (primary) hypertension: Secondary | ICD-10-CM

## 2013-10-31 DIAGNOSIS — M949 Disorder of cartilage, unspecified: Secondary | ICD-10-CM

## 2013-10-31 DIAGNOSIS — K2270 Barrett's esophagus without dysplasia: Secondary | ICD-10-CM

## 2013-10-31 DIAGNOSIS — Z23 Encounter for immunization: Secondary | ICD-10-CM

## 2013-10-31 DIAGNOSIS — G8929 Other chronic pain: Secondary | ICD-10-CM

## 2013-10-31 DIAGNOSIS — C189 Malignant neoplasm of colon, unspecified: Secondary | ICD-10-CM

## 2013-10-31 MED ORDER — LISINOPRIL-HYDROCHLOROTHIAZIDE 20-12.5 MG PO TABS: 1.0000 | ORAL_TABLET | Freq: Every day | ORAL | Status: DC

## 2013-10-31 NOTE — Patient Instructions (Signed)
Stop lisinopril/hctz 10/12.5.    Start lisinopril/hctz 20/12.5 - one per day

## 2013-10-31 NOTE — Progress Notes (Signed)
Pre visit review using our clinic review tool, if applicable. No additional management support is needed unless otherwise documented below in the visit note. 

## 2013-11-03 ENCOUNTER — Encounter: Payer: Self-pay | Admitting: Internal Medicine

## 2013-11-03 DIAGNOSIS — L989 Disorder of the skin and subcutaneous tissue, unspecified: Secondary | ICD-10-CM | POA: Insufficient documentation

## 2013-11-03 NOTE — Assessment & Plan Note (Signed)
Has chronic back and leg pain.  Has had extensive w/up.  Now seeing Dr Sharlet Salina.  He prescribes her pain meds.  Stable.  Follow.  S/p recent injection.

## 2013-11-03 NOTE — Assessment & Plan Note (Signed)
CEA checked 12/16/10 1.4.  Discussed with Dr Oliva Bustard.  Rec repeat in six months.  Last CEA 9/13 - 1.3.  Stable.  Colonoscopy 07/30/11 revealed internal hemorrhoids.  Just saw GI two weeks ago.  Dr Tiffany Kocher felt things were stable.  CEA 05/31/13 - .8.

## 2013-11-03 NOTE — Assessment & Plan Note (Signed)
Request referral to Dr Nehemiah Massed for skin survey and head lesion.

## 2013-11-03 NOTE — Assessment & Plan Note (Signed)
Bone density 2010 revealed osteopenia - no significant change.  Follow.  Continue calcium and vitamin D.  Need f/u bone density.

## 2013-11-03 NOTE — Progress Notes (Signed)
Subjective:    Patient ID: [name], female    DOB: Jul 05, 1928, 80 y.o.   MRN: [id number]  HPI 80 year old female with past history of hypertension, hypercholesterolemia, colon cancer, Barretts esophagitis and chronic back and leg pain who comes in today for a scheduled follow up.  She states she is doing relatively well.  Seeing Dr Sharlet Salina for her back and legs.  Feels things are stable.  States if she takes protonix, reglan and watches what she eats, no problems with reflux.  She did find out she was lactose intolerant and treatment for this has helped.  Breathing stable.  On Estrogen and desires not to stop.  Have discussed risk and side effects of long term estrogen therapy.  She elects to continue and not reduce the dose.  Overall she feels she is doing well.  Blood pressure up some.  Blood pressures elevated on her outside checks.  Blood pressures averaging 150-160s/70s.  Is s/p injection bilateral legs (bursitis).       Past Medical History  Diagnosis Date  . Colon cancer     adenocarcinoma, s/p resection x  2  . Bowel obstruction     s/p adhesion resection  . Recurrent sinus infections   . Vertigo   . Barrett's esophagus   . Hiatal hernia   . Hypercholesteremia   . Hypertension   . Chronic back pain   . Peripheral neuropathy     Current Outpatient Prescriptions on File Prior to Visit  Medication Sig Dispense Refill  . Calcium Carbonate-Vitamin D (CALCIUM 600 + D PO) Take 600 Units by mouth daily.       . Cholecalciferol (VITAMIN D3) 2000 UNITS TABS Take 2,000 Units by mouth daily.      Marland Kitchen estradiol (ESTRACE) 2 MG tablet Take 1 tablet (2 mg total) by mouth daily.  90 tablet  1  . fexofenadine (ALLEGRA) 60 MG tablet Take 1 tablet (60 mg total) by mouth daily.  90 tablet  3  . fish oil-omega-3 fatty acids 1000 MG capsule Take 2 g by mouth daily.      Marland Kitchen lactase (LACTAID) 3000 UNITS tablet Take 1 tablet by mouth as needed.      . lidocaine (LIDODERM) 5 % Place 1 patch onto the  skin as needed. Remove & Discard patch within 12 hours or as directed by MD      . meclizine (ANTIVERT) 25 MG tablet Take 25 mg by mouth 3 (three) times daily as needed.      . meloxicam (MOBIC) 7.5 MG tablet Take 7.5 mg by mouth daily.      . metoCLOPramide (REGLAN) 5 MG tablet Take 5 mg by mouth. Take one tablet three times a day      . metoprolol succinate (TOPROL-XL) 25 MG 24 hr tablet TAKE 1 TABLET BY MOUTH EVERY DAY  90 tablet  0  . oxyCODONE (OXY IR/ROXICODONE) 5 MG immediate release tablet Take 5 mg by mouth 2 (two) times daily as needed.       . pantoprazole (PROTONIX) 40 MG tablet Take 40 mg by mouth 2 (two) times daily.      . simvastatin (ZOCOR) 10 MG tablet TAKE 1 TABLET AT BEDTIME  90 tablet  1  . traMADol-acetaminophen (ULTRACET) 37.5-325 MG per tablet Take 1 tablet by mouth every 8 (eight) hours as needed.       No current facility-administered medications on file prior to visit.    Review of Systems Patient  denies any headache, lightheadedness or dizziness.  No sinus or allergy symptoms.  No chest pain, tightness or palpitations.  No increased shortness of breath, cough or congestion.  No nausea or vomiting.  No abdominal pain or cramping.  Acid reflux under reasonable control on her current regimen.  No significant bowel change, such as significant diarrhea, constipation, BRBPR or melana.  Bowels are doing better.  Back and leg pain stable.  S/p recent injection.  Blood pressures elevated.         Objective:   Physical Exam  Filed Vitals:   10/31/13 1128  BP: 122/70  Pulse: 62  Temp: 98.4 F (36.9 C)   Blood pressure recheck:  58/38  80 year old female in no acute distress.   HEENT:  Nares- clear.  Oropharynx - without lesions. NECK:  Supple.  Nontender.  No audible bruit.  HEART:  Appears to be regular. LUNGS:  No crackles or wheezing audible.  Respirations even and unlabored.  RADIAL PULSE:  Equal bilaterally.   ABDOMEN:  Soft, nontender.  Bowel sounds present  and normal.  No audible abdominal bruit.     EXTREMITIES:  No increased edema present.  DP pulses palpable and equal bilaterally.           Assessment & Plan:  HOT/FLUSHED FEELING.  Saw Dr Eddie Dibbles.  Had labs.  States everything checked out fine.  On estrogen.  Desires not to stop.  Better.    HEALTH MAINTENANCE.  Physical 05/07/13.  Colonoscopy and EGD 07/30/10.  Colonoscopy revealed internal hemorrhoids.  EGD revealed hiatal hernia and some changes suspicious for Barretts.  Path negative.  Pap 05/07/08 negative.  Mammogram 06/13/13  - BiRADS I.   Bone density 05/13/08 revealed osteopenia with no significant change from previous.     I spent 25 minutes with the patient and more than 50% of the time was spent in consultation regarding the above.

## 2013-11-03 NOTE — Assessment & Plan Note (Addendum)
Blood pressure elevated.  Change lisinopril 10/12.5 to 20/12.5 q day.  Follow pressures.  Get her back in soon to reassess.   Follow metabolic panel.

## 2013-11-03 NOTE — Assessment & Plan Note (Signed)
Low cholesterol diet.  On simvastatin.  Follow lipid panel and liver function.

## 2013-11-03 NOTE — Assessment & Plan Note (Signed)
Last EGD 07/30/10 revealed hiatal hernia and some changes suspicious for Barretts.  Path negative.  Reflux under reasonable control.  Just evaluated by Dr Tiffany Kocher.

## 2013-11-26 ENCOUNTER — Other Ambulatory Visit: Payer: Self-pay | Admitting: Internal Medicine

## 2013-12-19 ENCOUNTER — Ambulatory Visit (INDEPENDENT_AMBULATORY_CARE_PROVIDER_SITE_OTHER): Payer: Medicare HMO | Admitting: Internal Medicine

## 2013-12-19 ENCOUNTER — Encounter: Payer: Self-pay | Admitting: Internal Medicine

## 2013-12-19 VITALS — BP 126/70 | HR 58 | Temp 98.0°F | Ht 61.25 in | Wt 137.5 lb

## 2013-12-19 DIAGNOSIS — I10 Essential (primary) hypertension: Secondary | ICD-10-CM

## 2013-12-19 DIAGNOSIS — E7800 Pure hypercholesterolemia, unspecified: Secondary | ICD-10-CM

## 2013-12-19 NOTE — Progress Notes (Signed)
Pre visit review using our clinic review tool, if applicable. No additional management support is needed unless otherwise documented below in the visit note. 

## 2013-12-22 NOTE — Progress Notes (Signed)
Subjective:    Patient ID: [name], female    DOB: [birth year], 80 y.o.   MRN: [id number]  HPI 80 year old female with past history of hypertension, hypercholesterolemia, colon cancer, Barretts esophagitis and chronic back and leg pain who comes in today for a scheduled follow up.  Here to follow up on her blood pressure.  She states she is doing relatively well.  Seeing Dr Sharlet Salina for her back and legs.  Feels things are stable.  States if she takes protonix, reglan and watches what she eats, no problems with reflux.  Overall she feels she is doing well.  Blood pressure up some.  Blood pressures varying on outside checks (130s-160/60-70s).     Past Medical History  Diagnosis Date  . Colon cancer     adenocarcinoma, s/p resection x  2  . Bowel obstruction     s/p adhesion resection  . Recurrent sinus infections   . Vertigo   . Barrett's esophagus   . Hiatal hernia   . Hypercholesteremia   . Hypertension   . Chronic back pain   . Peripheral neuropathy     Current Outpatient Prescriptions on File Prior to Visit  Medication Sig Dispense Refill  . Calcium Carbonate-Vitamin D (CALCIUM 600 + D PO) Take 600 Units by mouth daily.     . Cholecalciferol (VITAMIN D3) 2000 UNITS TABS Take 2,000 Units by mouth daily.    Marland Kitchen estradiol (ESTRACE) 2 MG tablet TAKE 1 TABLET EVERY DAY 90 tablet 1  . fexofenadine (ALLEGRA) 60 MG tablet Take 1 tablet (60 mg total) by mouth daily. 90 tablet 3  . fish oil-omega-3 fatty acids 1000 MG capsule Take 2 g by mouth daily.    Marland Kitchen lactase (LACTAID) 3000 UNITS tablet Take 1 tablet by mouth as needed.    . lidocaine (LIDODERM) 5 % Place 1 patch onto the skin as needed. Remove & Discard patch within 12 hours or as directed by MD    . lisinopril-hydrochlorothiazide (PRINZIDE,ZESTORETIC) 20-12.5 MG per tablet Take 1 tablet by mouth daily. 30 tablet 2  . meclizine (ANTIVERT) 25 MG tablet Take 25 mg by mouth 3 (three) times daily as needed.    . meloxicam (MOBIC) 7.5  MG tablet Take 7.5 mg by mouth daily.    . metoCLOPramide (REGLAN) 5 MG tablet Take 5 mg by mouth. Take one tablet three times a day    . metoprolol succinate (TOPROL-XL) 25 MG 24 hr tablet TAKE 1 TABLET BY MOUTH EVERY DAY 90 tablet 0  . oxyCODONE (OXY IR/ROXICODONE) 5 MG immediate release tablet Take 5 mg by mouth 2 (two) times daily as needed.     . pantoprazole (PROTONIX) 40 MG tablet Take 40 mg by mouth 2 (two) times daily.    . simvastatin (ZOCOR) 10 MG tablet TAKE 1 TABLET AT BEDTIME 90 tablet 1  . traMADol-acetaminophen (ULTRACET) 37.5-325 MG per tablet Take 1 tablet by mouth every 8 (eight) hours as needed.     No current facility-administered medications on file prior to visit.    Review of Systems Patient denies any headache, lightheadedness or dizziness.  No sinus or allergy symptoms.  No chest pain, tightness or palpitations.  No increased shortness of breath, cough or congestion.  No nausea or vomiting.  No abdominal pain or cramping.  Acid reflux under reasonable control on her current regimen.  No significant bowel change, such as significant diarrhea, constipation, BRBPR or melana.  Bowels are doing better.  Back and  leg pain stable.  Blood pressures as outlined.  Increased her lisinopril/hctz to 20/12.5 last visit.       Objective:   Physical Exam  Filed Vitals:   12/19/13 1054  BP: 126/70  Pulse: 58  Temp: 98 F (36.7 C)   Blood pressure recheck:  73-79/102  80 year old female in no acute distress.   HEENT:  Nares- clear.  Oropharynx - without lesions. NECK:  Supple.  Nontender.  No audible bruit.  HEART:  Appears to be regular. LUNGS:  No crackles or wheezing audible.  Respirations even and unlabored.  RADIAL PULSE:  Equal bilaterally.   ABDOMEN:  Soft, nontender.  Bowel sounds present and normal.  No audible abdominal bruit.     EXTREMITIES:  No increased edema present.  DP pulses palpable and equal bilaterally.           Assessment & Plan:  Essential  hypertension Blood pressure as outlined.  Continue same medication regimen.  Follow.  Follow metabolic panel.    HEALTH MAINTENANCE.  Physical 05/07/13.  Colonoscopy and EGD 07/30/10.  Colonoscopy revealed internal hemorrhoids.  EGD revealed hiatal hernia and some changes suspicious for Barretts.  Path negative.  Pap 05/07/08 negative.  Mammogram 06/13/13  - BiRADS I.   Bone density 05/13/08 revealed osteopenia with no significant change from previous.

## 2014-01-13 ENCOUNTER — Other Ambulatory Visit: Payer: Self-pay | Admitting: Internal Medicine

## 2014-02-13 ENCOUNTER — Telehealth: Payer: Self-pay | Admitting: *Deleted

## 2014-02-13 NOTE — Telephone Encounter (Signed)
Pt left voicemail complaining of Cough for several days. Tried Mucinex DM & Delsym, No fever & cough worse at night. Wants to know if you could call something in for her. Please advise. Pt last seen in November.   I Spoke with patient & notified her that she would need to be seen for a Rx medication. Pt was informed that I could see if another office has an opening today since we are booked & closing early. I also notified her that she could be seen today at a walk-in or Urgent care today for treatment. Pt states that she really didn't want to go out to day but was thankful that I called her back with this information.

## 2014-02-13 NOTE — Telephone Encounter (Signed)
I agree with need for evaluation today. .  Acute care also open over weekend.   Can be seen and f/u with [place] next week.

## 2014-02-24 DIAGNOSIS — M7061 Trochanteric bursitis, right hip: Secondary | ICD-10-CM | POA: Diagnosis not present

## 2014-02-24 DIAGNOSIS — M62830 Muscle spasm of back: Secondary | ICD-10-CM | POA: Diagnosis not present

## 2014-02-24 DIAGNOSIS — M5416 Radiculopathy, lumbar region: Secondary | ICD-10-CM | POA: Diagnosis not present

## 2014-02-24 DIAGNOSIS — M4726 Other spondylosis with radiculopathy, lumbar region: Secondary | ICD-10-CM | POA: Diagnosis not present

## 2014-03-04 ENCOUNTER — Other Ambulatory Visit (INDEPENDENT_AMBULATORY_CARE_PROVIDER_SITE_OTHER): Payer: Commercial Managed Care - HMO

## 2014-03-04 DIAGNOSIS — I10 Essential (primary) hypertension: Secondary | ICD-10-CM | POA: Diagnosis not present

## 2014-03-04 DIAGNOSIS — E7800 Pure hypercholesterolemia, unspecified: Secondary | ICD-10-CM

## 2014-03-04 LAB — BASIC METABOLIC PANEL
BUN: 26 mg/dL — AB (ref 6–23)
CALCIUM: 9.5 mg/dL (ref 8.4–10.5)
CHLORIDE: 98 meq/L (ref 96–112)
CO2: 27 mEq/L (ref 19–32)
Creatinine, Ser: 0.93 mg/dL (ref 0.40–1.20)
GFR: 61.18 mL/min (ref >60.00)
GLUCOSE: 113 mg/dL — AB (ref 70–99)
Potassium: 4.3 mEq/L (ref 3.5–5.1)
SODIUM: 135 meq/L (ref 135–145)

## 2014-03-04 LAB — HEPATIC FUNCTION PANEL
ALT: 8 U/L (ref 0–35)
AST: 11 U/L (ref 0–37)
Albumin: 3.3 g/dL — ABNORMAL LOW (ref 3.5–5.2)
Alkaline Phosphatase: 64 U/L (ref 39–117)
BILIRUBIN DIRECT: 0.2 mg/dL (ref 0.0–0.3)
BILIRUBIN TOTAL: 0.9 mg/dL (ref 0.2–1.2)
TOTAL PROTEIN: 6.7 g/dL (ref 6.0–8.3)

## 2014-03-04 LAB — LIPID PANEL
CHOL/HDL RATIO: 2
Cholesterol: 146 mg/dL (ref 0–200)
HDL: 64 mg/dL (ref >39.00)
LDL CALC: 65 mg/dL (ref 0–99)
NonHDL: 82
Triglycerides: 85 mg/dL (ref 0.0–149.0)
VLDL: 17 mg/dL (ref 0.0–40.0)

## 2014-03-06 ENCOUNTER — Ambulatory Visit (INDEPENDENT_AMBULATORY_CARE_PROVIDER_SITE_OTHER): Payer: Commercial Managed Care - HMO | Admitting: Internal Medicine

## 2014-03-06 ENCOUNTER — Encounter: Payer: Self-pay | Admitting: Internal Medicine

## 2014-03-06 VITALS — BP 130/60 | HR 88 | Temp 98.5°F | Ht 61.25 in | Wt 134.8 lb

## 2014-03-06 DIAGNOSIS — G8929 Other chronic pain: Secondary | ICD-10-CM

## 2014-03-06 DIAGNOSIS — R351 Nocturia: Secondary | ICD-10-CM

## 2014-03-06 DIAGNOSIS — K2270 Barrett's esophagus without dysplasia: Secondary | ICD-10-CM

## 2014-03-06 DIAGNOSIS — E7800 Pure hypercholesterolemia, unspecified: Secondary | ICD-10-CM

## 2014-03-06 DIAGNOSIS — C189 Malignant neoplasm of colon, unspecified: Secondary | ICD-10-CM

## 2014-03-06 DIAGNOSIS — N951 Menopausal and female climacteric states: Secondary | ICD-10-CM

## 2014-03-06 DIAGNOSIS — I10 Essential (primary) hypertension: Secondary | ICD-10-CM

## 2014-03-06 DIAGNOSIS — M549 Dorsalgia, unspecified: Secondary | ICD-10-CM

## 2014-03-06 LAB — URINALYSIS, ROUTINE W REFLEX MICROSCOPIC
BILIRUBIN URINE: NEGATIVE
HGB URINE DIPSTICK: NEGATIVE
Ketones, ur: NEGATIVE
LEUKOCYTES UA: NEGATIVE
NITRITE: NEGATIVE
Specific Gravity, Urine: 1.015 (ref 1.000–1.030)
URINE GLUCOSE: NEGATIVE
UROBILINOGEN UA: 0.2 (ref 0.0–1.0)
pH: 7 (ref 5.0–8.0)

## 2014-03-06 MED ORDER — LISINOPRIL 20 MG PO TABS: 20.0000 mg | ORAL_TABLET | Freq: Every day | ORAL | Status: DC

## 2014-03-06 MED ORDER — AZITHROMYCIN 250 MG PO TABS: ORAL_TABLET | ORAL | Status: DC

## 2014-03-06 NOTE — Patient Instructions (Addendum)
Saline nasal spray - flush nose at least 2-3x/day  nasacort nasal spray - 2 sprays each nostril one time per day.    mucinex DM in the am and Robitussin DM in the pm.    Stop lisinopril/hctz and start lisinopril 20mg  - one per day.

## 2014-03-06 NOTE — Progress Notes (Signed)
Pre visit review using our clinic review tool, if applicable. No additional management support is needed unless otherwise documented below in the visit note. 

## 2014-03-06 NOTE — Progress Notes (Signed)
Subjective:    Patient ID: [name], female    DOB: Apr 25, 1928, 81 y.o.   MRN: [id number]  HPI 81 year old female with past history of hypertension, hypercholesterolemia, colon cancer, Barretts esophagitis and chronic back and leg pain who comes in today for a scheduled follow up.  Here to follow up on her blood pressure.  She states she is doing relatively well.  Seeing Dr Sharlet Salina for her back and legs.  Feels things are stable.  States if she takes protonix, reglan and watches what she eats, no significant problems with reflux.  Still with have some issues.  Sees GI regularly.  She reports having increased cough for the last three weeks.  Some productive yellow mucus recently.  No fever.  Some pain under her left breast with coughing and palpation.  Has been taking some delsym and mucinex.  Has noticed some runny nose and increased drainage.  No sore throat.  No vomiting or diarrhea.  She also reports some increased sweats.   Notices intermittently.  Was questioning if the blood pressure medication could be contributing.  Also reports problems with increased urination - nocturia.  No dysuria.       Past Medical History  Diagnosis Date  . Colon cancer     adenocarcinoma, s/p resection x  2  . Bowel obstruction     s/p adhesion resection  . Recurrent sinus infections   . Vertigo   . Barrett's esophagus   . Hiatal hernia   . Hypercholesteremia   . Hypertension   . Chronic back pain   . Peripheral neuropathy     Current Outpatient Prescriptions on File Prior to Visit  Medication Sig Dispense Refill  . Calcium Carbonate-Vitamin D (CALCIUM 600 + D PO) Take 600 Units by mouth daily.     . Cholecalciferol (VITAMIN D3) 2000 UNITS TABS Take 2,000 Units by mouth daily.    Marland Kitchen estradiol (ESTRACE) 2 MG tablet TAKE 1 TABLET EVERY DAY 90 tablet 1  . fexofenadine (ALLEGRA) 60 MG tablet Take 1 tablet (60 mg total) by mouth daily. 90 tablet 3  . fish oil-omega-3 fatty acids 1000 MG capsule Take 2 g  by mouth daily.    Marland Kitchen lactase (LACTAID) 3000 UNITS tablet Take 1 tablet by mouth as needed.    . lidocaine (LIDODERM) 5 % Place 1 patch onto the skin as needed. Remove & Discard patch within 12 hours or as directed by MD    . lisinopril-hydrochlorothiazide (PRINZIDE,ZESTORETIC) 20-12.5 MG per tablet TAKE 1 TABLET BY MOUTH DAILY. 30 tablet 5  . meclizine (ANTIVERT) 25 MG tablet Take 25 mg by mouth 3 (three) times daily as needed.    . meloxicam (MOBIC) 7.5 MG tablet Take 7.5 mg by mouth daily.    . metoCLOPramide (REGLAN) 5 MG tablet Take 5 mg by mouth. Take one tablet three times a day    . metoprolol succinate (TOPROL-XL) 25 MG 24 hr tablet TAKE 1 TABLET BY MOUTH EVERY DAY 90 tablet 0  . oxyCODONE (OXY IR/ROXICODONE) 5 MG immediate release tablet Take 5 mg by mouth 2 (two) times daily as needed.     . pantoprazole (PROTONIX) 40 MG tablet Take 40 mg by mouth 2 (two) times daily.    . simvastatin (ZOCOR) 10 MG tablet TAKE 1 TABLET AT BEDTIME 90 tablet 1  . traMADol-acetaminophen (ULTRACET) 37.5-325 MG per tablet Take 1 tablet by mouth every 8 (eight) hours as needed.     No current facility-administered  medications on file prior to visit.    Review of Systems Patient denies any headache, lightheadedness or dizziness.  No chest pain, tightness or palpitations.   Does report some increased cough as outlined.  Increased drainage and nasal congestion.  Cough now productive of yellow mucus.  No nausea or vomiting.  No abdominal pain or cramping.  Acid reflux under reasonable control on her current regimen.  Still some break through symptoms.  See above.  No significant bowel change, such as significant diarrhea, constipation, BRBPR or melana.  Bowels are doing better.  Back and leg pain stable.  On lisinopril/hctz now.  Was questioning if this could be contributing to her sweats.        Objective:   Physical Exam  Filed Vitals:   03/06/14 1110  BP: 130/60  Pulse: 88  Temp: 98.5 F (36.9 C)    Blood pressure recheck:  48/78  81 year old female in no acute distress.   HEENT:  Nares- clear.  Oropharynx - without lesions. NECK:  Supple.  Nontender.  No audible bruit.  HEART:  Appears to be regular. LUNGS:  No crackles or wheezing audible.  Respirations even and unlabored.  RADIAL PULSE:  Equal bilaterally.   ABDOMEN:  Soft, nontender.  Bowel sounds present and normal.  No audible abdominal bruit.     EXTREMITIES:  No increased edema present.  DP pulses palpable and equal bilaterally.           Assessment & Plan:  1. Nocturia No acute urinary symptoms.  We discussed overactive bladder.  Discussed possible side effects of the medication including dryness.  She is concerned that the hctz is contributing to her nocturia.   - Urinalysis, Routine w reflex microscopic  2. Essential hypertension Blood pressure as outlined.  Will change the lisinopril/hctz to lisinopril to 20mg  q day.  Follow pressures.  See if sweats improve.    3. Barrett's esophagus Some breakthrough symptoms as outlined.  Seeing GI.    4. Colon cancer Colonoscopy 07/30/10 - internal hemorrhoids.    5. Chronic back pain Stable.  Seeing Dr Sharlet Salina.    6. Hypercholesteremia On simvastatin.  Low cholesterol diet and exercise.  Follow lipid panel and liver function tests.    7. Sweats, menopausal Unclear etiology.  No symptoms associated with the sweats.  Change the blood pressure medication as outlined.  Check urine to confirm no infection.  Follow.    8.  COUGH.  Will treat with saline nasal spray and nasacort nasal spray as outlined.  mucinex DM in the am and robitussin DM in the evening.  If symptom worsen, zpak as directed.     HEALTH MAINTENANCE.  Physical 05/07/13.  Colonoscopy and EGD 07/30/10.  Colonoscopy revealed internal hemorrhoids.  EGD revealed hiatal hernia and some changes suspicious for Barretts.  Path negative.  Pap 05/07/08 negative.  Mammogram 06/13/13  - BiRADS I.   Bone density 05/13/08  revealed osteopenia with no significant change from previous.

## 2014-03-07 ENCOUNTER — Encounter: Payer: Self-pay | Admitting: Internal Medicine

## 2014-03-07 DIAGNOSIS — R232 Flushing: Secondary | ICD-10-CM | POA: Insufficient documentation

## 2014-03-20 ENCOUNTER — Other Ambulatory Visit: Payer: Self-pay | Admitting: *Deleted

## 2014-03-20 MED ORDER — METOPROLOL SUCCINATE ER 25 MG PO TB24: ORAL_TABLET | ORAL | Status: DC

## 2014-03-20 MED ORDER — SIMVASTATIN 10 MG PO TABS: 10.0000 mg | ORAL_TABLET | Freq: Every day | ORAL | Status: DC

## 2014-03-20 NOTE — Telephone Encounter (Signed)
Pt called, needing refills. Rx sent to pharmacy by escript

## 2014-03-28 ENCOUNTER — Other Ambulatory Visit: Payer: Self-pay | Admitting: Internal Medicine

## 2014-04-08 ENCOUNTER — Emergency Department: Payer: Self-pay | Admitting: Internal Medicine

## 2014-04-08 ENCOUNTER — Telehealth: Payer: Self-pay | Admitting: Internal Medicine

## 2014-04-08 DIAGNOSIS — F419 Anxiety disorder, unspecified: Secondary | ICD-10-CM | POA: Diagnosis not present

## 2014-04-08 DIAGNOSIS — D649 Anemia, unspecified: Secondary | ICD-10-CM | POA: Diagnosis not present

## 2014-04-08 DIAGNOSIS — J189 Pneumonia, unspecified organism: Secondary | ICD-10-CM | POA: Diagnosis not present

## 2014-04-08 DIAGNOSIS — R609 Edema, unspecified: Secondary | ICD-10-CM | POA: Diagnosis not present

## 2014-04-08 DIAGNOSIS — J441 Chronic obstructive pulmonary disease with (acute) exacerbation: Secondary | ICD-10-CM | POA: Diagnosis not present

## 2014-04-08 DIAGNOSIS — I10 Essential (primary) hypertension: Secondary | ICD-10-CM | POA: Diagnosis not present

## 2014-04-08 DIAGNOSIS — R531 Weakness: Secondary | ICD-10-CM | POA: Diagnosis not present

## 2014-04-08 DIAGNOSIS — R918 Other nonspecific abnormal finding of lung field: Secondary | ICD-10-CM | POA: Diagnosis not present

## 2014-04-08 NOTE — Telephone Encounter (Signed)
ED stated they  Have had no one by that name, called patient home and left voicemail to call office.

## 2014-04-08 NOTE — Telephone Encounter (Signed)
Please call back and see if pt went to ER or call ER and see if pt there.  Thanks.

## 2014-04-08 NOTE — Telephone Encounter (Signed)
Tried to call patient and make sure patient went to ER no answer left message to call office.

## 2014-04-08 NOTE — Telephone Encounter (Signed)
Patient Name: [name]  DOB: Aug 12, 1928    Initial Comment caller states she has swelling in feet, legs, has a cough and pain under breasts wrapping around to her back   Nurse Assessment  Nurse: Mallie Mussel, RN, Alveta Heimlich Date/Time Eilene Ghazi Time): 04/08/2014 2:23:24 PM  Confirm and document reason for call. If symptomatic, describe symptoms. ---Caller states that she has swelling in both of her legs and feet which she has had off and on for 2 weeks. Her left leg looks more swollen than the right leg. She has a cough which she has had since January. The cough gets better then it comes back. Its been worse the last couple days. She denies difficulty breathing. She has some chest pain under her left breast that goes around to the middle of her back. Denies pain and burning with urination. She rates the chest/back pain as 5 on 0-10 scale.  Has the patient traveled out of the country within the last 30 days? ---No  Does the patient require triage? ---Yes  Related visit to physician within the last 2 weeks? ---No  Does the PT have any chronic conditions? (i.e. diabetes, asthma, etc.) ---Yes  List chronic conditions. ---HTN     Guidelines    Guideline Title Affirmed Question Affirmed Notes  Leg Swelling and Edema [1] Thigh, calf, or ankle swelling AND [2] bilateral AND [3] 1 side is more swollen    Final Disposition User   See Physician within 4 Hours (or PCP triage) Mallie Mussel, RN, Alveta Heimlich    Comments  Attempted to schedule at [facility] and [facility]. No appointment available at either facility. I sent her to ER aa her symptoms can be suggestive of a blood clot.

## 2014-04-09 NOTE — Telephone Encounter (Signed)
Patient was seen In ER and advised to Follow up with MD for pneumonia , Please advise where to put patient.

## 2014-04-09 NOTE — Telephone Encounter (Signed)
Please f/u today and see if you can get in touch with her and confirm doing ok.  Needs to be seen today if not seen yesterday.  Thanks.

## 2014-04-09 NOTE — Telephone Encounter (Signed)
I can see her on 04/17/14 at 12:15.  Make sure she is doing ok.  Will need ER records.

## 2014-04-10 ENCOUNTER — Encounter: Payer: Self-pay | Admitting: Nurse Practitioner

## 2014-04-10 ENCOUNTER — Ambulatory Visit (INDEPENDENT_AMBULATORY_CARE_PROVIDER_SITE_OTHER): Payer: Commercial Managed Care - HMO | Admitting: Nurse Practitioner

## 2014-04-10 VITALS — BP 124/66 | HR 83 | Temp 98.0°F | Resp 16 | Ht 61.0 in | Wt 134.4 lb

## 2014-04-10 DIAGNOSIS — Z09 Encounter for follow-up examination after completed treatment for conditions other than malignant neoplasm: Secondary | ICD-10-CM

## 2014-04-10 MED ORDER — ONDANSETRON HCL 4 MG PO TABS: 4.0000 mg | ORAL_TABLET | Freq: Three times a day (TID) | ORAL | Status: DC | PRN

## 2014-04-10 NOTE — Telephone Encounter (Signed)
Pt stated that ER told her she needs to be seen today or tomorrow. They did not keep her because they did not have any beds. Please advise. (ER Records requested)

## 2014-04-10 NOTE — Progress Notes (Signed)
Pre visit review using our clinic review tool, if applicable. No additional management support is needed unless otherwise documented below in the visit note. 

## 2014-04-10 NOTE — Progress Notes (Signed)
Subjective:    Patient ID: [name], female    DOB: [birth year], 81 y.o.   MRN: [id number]  HPI  [name] is a 81 yo female here for a ER follow up from 04/08/14 at [facility] for pneumonia. She is accompanied by her daughter who is a supplemental historian.   1) Tuesday evening [facility]- Chest x-ray shows left upper lobe penumonia- Iv abx and Iv lasix (BNP 1371). Sent home due to 98-100% continuous O2 sats. Pt's daughter states there were no beds available and the wait would be 5 hours. She was d/c'd and told to follow up with Dr. Nicki Reaper ASAP.   EKG- normal  Troponin normal  CBC w/ diff- 17.7 WBC, HGB 9.1, HCT 28.3, PLT 481, Glu 146  BMET- BUN 20, BNP 1371 d  CC today include low energy, nausea, and the left chest pain from underneath her breast that radiates to her back. She is still taking Levaquin PO and Lasix 20 mg PO daily (with 3 days left- no refills). Ankle swelling has improved per pt and daughter.   Review of Systems  Constitutional: Negative for fever, chills, diaphoresis and fatigue.  Respiratory: Positive for cough. Negative for chest tightness, shortness of breath and wheezing.   Cardiovascular: Positive for leg swelling. Negative for chest pain and palpitations.  Gastrointestinal: Positive for nausea. Negative for vomiting, diarrhea and rectal pain.  Musculoskeletal: Positive for arthralgias.  Skin: Negative for rash.  Neurological: Negative for dizziness, weakness, numbness and headaches.  Psychiatric/Behavioral: The patient is not nervous/anxious.    Past Medical History  Diagnosis Date  . Colon cancer     adenocarcinoma, s/p resection x  2  . Bowel obstruction     s/p adhesion resection  . Recurrent sinus infections   . Vertigo   . Barrett's esophagus   . Hiatal hernia   . Hypercholesteremia   . Hypertension   . Chronic back pain   . Peripheral neuropathy     History   Social History  . Marital Status: Married    Spouse Name: N/A  . Number of Children: 4   . Years of Education: 12th grade   Occupational History  . homemaker    Social History Main Topics  . Smoking status: Never Smoker   . Smokeless tobacco: Never Used  . Alcohol Use: No  . Drug Use: No  . Sexual Activity: No   Other Topics Concern  . Not on file   Social History Narrative    Past Surgical History  Procedure Laterality Date  . Colon resection      x2.  s/p colon cancer  . Adhesions resected      bowel obstruction  . Cholecystectomy    . Abdominal hysterectomy  1982  . Appendectomy    . Excisional hemorrhoidectomy      with tubal ligation  . Breast biopsy      Family History  Problem Relation Age of Onset  . Breast cancer Mother   . Stroke Father   . Hypertension Father   . Diabetes Father   . Asthma Mother   . Ovarian cancer Sister     x2  . Prostate cancer Brother   . Hypertension Brother     x3  . Hypercholesterolemia Brother     x3  . Diabetes Brother     No Known Allergies  Current Outpatient Prescriptions on File Prior to Visit  Medication Sig Dispense Refill  . azithromycin (ZITHROMAX) 250 MG tablet Take  2 pills x 1 day and then one pill per day for four more days. 6 tablet 0  . Calcium Carbonate-Vitamin D (CALCIUM 600 + D PO) Take 600 Units by mouth daily.     . Cholecalciferol (VITAMIN D3) 2000 UNITS TABS Take 2,000 Units by mouth daily.    Marland Kitchen estradiol (ESTRACE) 2 MG tablet TAKE 1 TABLET EVERY DAY 90 tablet 1  . fexofenadine (ALLEGRA) 60 MG tablet Take 1 tablet (60 mg total) by mouth daily. 90 tablet 3  . fish oil-omega-3 fatty acids 1000 MG capsule Take 2 g by mouth daily.    Marland Kitchen lactase (LACTAID) 3000 UNITS tablet Take 1 tablet by mouth as needed.    . lidocaine (LIDODERM) 5 % Place 1 patch onto the skin as needed. Remove & Discard patch within 12 hours or as directed by MD    . lisinopril (PRINIVIL,ZESTRIL) 20 MG tablet Take 1 tablet (20 mg total) by mouth daily. 30 tablet 2  . meclizine (ANTIVERT) 25 MG tablet Take 25 mg by mouth  3 (three) times daily as needed.    . meloxicam (MOBIC) 7.5 MG tablet Take 7.5 mg by mouth daily.    . metoCLOPramide (REGLAN) 5 MG tablet Take 5 mg by mouth. Take one tablet three times a day    . metoprolol succinate (TOPROL-XL) 25 MG 24 hr tablet TAKE 1 TABLET BY MOUTH EVERY DAY 90 tablet 1  . oxyCODONE (OXY IR/ROXICODONE) 5 MG immediate release tablet Take 5 mg by mouth 2 (two) times daily as needed.     . pantoprazole (PROTONIX) 40 MG tablet Take 40 mg by mouth 2 (two) times daily.    . simvastatin (ZOCOR) 10 MG tablet TAKE 1 TABLET AT BEDTIME 90 tablet 1  . traMADol-acetaminophen (ULTRACET) 37.5-325 MG per tablet Take 1 tablet by mouth every 8 (eight) hours as needed.     No current facility-administered medications on file prior to visit.      Objective:   Physical Exam  Constitutional: She is oriented to person, place, and time. She appears well-developed and well-nourished. No distress.  BP 124/66 mmHg  Pulse 83  Temp(Src) 98 F (36.7 C) (Oral)  Resp 16  Ht 5\' 1"  (1.549 m)  Wt 134 lb 6.4 oz (60.963 kg)  BMI 25.41 kg/m2  SpO2 96%  Pt appears comfortable  HENT:  Head: Normocephalic and atraumatic.  Right Ear: External ear normal.  Left Ear: External ear normal.  Eyes: Right eye exhibits no discharge. Left eye exhibits no discharge. No scleral icterus.  Cardiovascular: Normal rate, regular rhythm, normal heart sounds and intact distal pulses.  Exam reveals no gallop and no friction rub.   No murmur heard. Pulmonary/Chest: Effort normal. No respiratory distress. She has no wheezes. She has no rales. She exhibits no tenderness.  Decreased breath sounds left upper lobe still. No rhonchi, rales, or wheezes.   Musculoskeletal: Normal range of motion. She exhibits edema and tenderness.  Back tenderness (chronic back pain), ankle swelling is non-pitting and slight.   Neurological: She is alert and oriented to person, place, and time. No cranial nerve deficit. She exhibits normal  muscle tone. Coordination normal.  Skin: Skin is warm and dry. No rash noted. She is not diaphoretic.  Psychiatric: She has a normal mood and affect. Her behavior is normal. Judgment and thought content normal.      Assessment & Plan:

## 2014-04-10 NOTE — Patient Instructions (Signed)
Lots of fluids.   Call us if worsening  A lot of rest. It will take awhile to feel better  No chest x-ray at this time.   Follow up next week with Dr. Nicki Reaper.   Zofran for nausea- can take every 8 hours while awake

## 2014-04-10 NOTE — Telephone Encounter (Signed)
Pt seeing Morey Hummingbird today @ 3:30

## 2014-04-10 NOTE — Telephone Encounter (Signed)
Just returned from being off, Since I am already working in someone during lunch tomorrow and going to add pt at 4:15 - see if she can see Morey Hummingbird tomorrow and still f/u with me next week.

## 2014-04-11 ENCOUNTER — Ambulatory Visit: Payer: Commercial Managed Care - HMO | Admitting: Nurse Practitioner

## 2014-04-14 DIAGNOSIS — Z09 Encounter for follow-up examination after completed treatment for conditions other than malignant neoplasm: Secondary | ICD-10-CM | POA: Insufficient documentation

## 2014-04-14 NOTE — Assessment & Plan Note (Signed)
Improving. Pt still weak and nauseated. Still taking Levaquin PO, will continue until gone. Lasix 20 mg will continue for 3 more days. She has improved in her peripheral edema. Instructed pt and daughter to try staying hydrated, rest, Zofran rx for nausea, and following up with Dr. Nicki Reaper next week. Call us if worsening/failure to improve.

## 2014-04-18 ENCOUNTER — Encounter: Payer: Self-pay | Admitting: Nurse Practitioner

## 2014-04-18 ENCOUNTER — Ambulatory Visit (INDEPENDENT_AMBULATORY_CARE_PROVIDER_SITE_OTHER): Payer: Commercial Managed Care - HMO | Admitting: Nurse Practitioner

## 2014-04-18 VITALS — BP 116/68 | HR 68 | Temp 97.8°F | Resp 16 | Ht 61.0 in | Wt 137.0 lb

## 2014-04-18 DIAGNOSIS — D509 Iron deficiency anemia, unspecified: Secondary | ICD-10-CM | POA: Diagnosis not present

## 2014-04-18 DIAGNOSIS — Z09 Encounter for follow-up examination after completed treatment for conditions other than malignant neoplasm: Secondary | ICD-10-CM | POA: Diagnosis not present

## 2014-04-18 LAB — CBC WITH DIFFERENTIAL/PLATELET
BASOS PCT: 0.3 % (ref 0.0–3.0)
Basophils Absolute: 0 10*3/uL (ref 0.0–0.1)
EOS ABS: 0.2 10*3/uL (ref 0.0–0.7)
Eosinophils Relative: 1.9 % (ref 0.0–5.0)
HCT: 30.8 % — ABNORMAL LOW (ref 36.0–46.0)
Hemoglobin: 10.4 g/dL — ABNORMAL LOW (ref 12.0–15.0)
LYMPHS PCT: 15.8 % (ref 12.0–46.0)
Lymphs Abs: 1.6 10*3/uL (ref 0.7–4.0)
MCHC: 33.6 g/dL (ref 30.0–36.0)
MCV: 88.6 fl (ref 78.0–100.0)
MONOS PCT: 5.2 % (ref 3.0–12.0)
Monocytes Absolute: 0.5 10*3/uL (ref 0.1–1.0)
NEUTROS PCT: 76.8 % (ref 43.0–77.0)
Neutro Abs: 7.9 10*3/uL — ABNORMAL HIGH (ref 1.4–7.7)
PLATELETS: 574 10*3/uL — AB (ref 150.0–400.0)
RBC: 3.47 Mil/uL — ABNORMAL LOW (ref 3.87–5.11)
RDW: 16.4 % — AB (ref 11.5–15.5)
WBC: 10.3 10*3/uL (ref 4.0–10.5)

## 2014-04-18 LAB — IBC PANEL
Iron: 93 ug/dL (ref 42–145)
Saturation Ratios: 33 % (ref 20.0–50.0)
TRANSFERRIN: 201 mg/dL — AB (ref 212.0–360.0)

## 2014-04-18 LAB — COMPREHENSIVE METABOLIC PANEL
ALBUMIN: 3.5 g/dL (ref 3.5–5.2)
ALT: 8 U/L (ref 0–35)
AST: 13 U/L (ref 0–37)
Alkaline Phosphatase: 61 U/L (ref 39–117)
BUN: 27 mg/dL — AB (ref 6–23)
CHLORIDE: 101 meq/L (ref 96–112)
CO2: 29 meq/L (ref 19–32)
CREATININE: 1.29 mg/dL — AB (ref 0.40–1.20)
Calcium: 9.4 mg/dL (ref 8.4–10.5)
GFR: 41.93 mL/min — ABNORMAL LOW (ref >60.00)
GLUCOSE: 108 mg/dL — AB (ref 70–99)
Potassium: 4.8 mEq/L (ref 3.5–5.1)
Sodium: 136 mEq/L (ref 135–145)
Total Bilirubin: 0.5 mg/dL (ref 0.2–1.2)
Total Protein: 6.5 g/dL (ref 6.0–8.3)

## 2014-04-18 LAB — FERRITIN: Ferritin: 557.6 ng/mL — ABNORMAL HIGH (ref 10.0–291.0)

## 2014-04-18 NOTE — Progress Notes (Signed)
Pre visit review using our clinic review tool, if applicable. No additional management support is needed unless otherwise documented below in the visit note. 

## 2014-04-18 NOTE — Progress Notes (Signed)
   Subjective:    Patient ID: [name], female    DOB: 1928-06-14, 81 y.o.   MRN: [id number]  HPI  [name] is a 81 yo female here for a 1 week follow up for pneumonia.   1) Improved, sore left shoulder, right eye is slightly red medially. Daughter feels this is from coughing excessively.   Review of Systems  Constitutional: Positive for fatigue. Negative for fever, chills and diaphoresis.  Respiratory: Negative for chest tightness, shortness of breath and wheezing.   Cardiovascular: Negative for chest pain, palpitations and leg swelling.  Gastrointestinal: Negative for nausea, vomiting, diarrhea and rectal pain.  Skin: Negative for rash.  Neurological: Negative for dizziness, weakness, numbness and headaches.  Psychiatric/Behavioral: The patient is not nervous/anxious.    .    Objective:   Physical Exam  Constitutional: She is oriented to person, place, and time. She appears well-developed and well-nourished. No distress.  BP 116/68 mmHg  Pulse 68  Temp(Src) 97.8 F (36.6 C) (Oral)  Resp 16  Ht 5\' 1"  (1.549 m)  Wt 137 lb (62.143 kg)  BMI 25.90 kg/m2  SpO2 95%  Pt looks improved   HENT:  Head: Normocephalic and atraumatic.  Right Ear: External ear normal.  Left Ear: External ear normal.  Cardiovascular: Normal rate, regular rhythm, normal heart sounds and intact distal pulses.  Exam reveals no gallop and no friction rub.   No murmur heard. Pulmonary/Chest: Effort normal and breath sounds normal. No respiratory distress. She has no wheezes. She has no rales. She exhibits no tenderness.  Neurological: She is alert and oriented to person, place, and time. No cranial nerve deficit. She exhibits normal muscle tone. Coordination normal.  Skin: Skin is warm and dry. No rash noted. She is not diaphoretic.  Psychiatric: She has a normal mood and affect. Her behavior is normal. Judgment and thought content normal.       Assessment & Plan:

## 2014-04-18 NOTE — Patient Instructions (Addendum)
Keep your appointment with Dr. Nicki Reaper at the end of the month.   We will call you with your results and any further instructions.

## 2014-04-21 DIAGNOSIS — D509 Iron deficiency anemia, unspecified: Secondary | ICD-10-CM | POA: Insufficient documentation

## 2014-04-21 NOTE — Assessment & Plan Note (Signed)
Further improvement. Zofran was helpful for nausea and appetite. Will see Dr. Nicki Reaper in a few weeks. FU as needed.

## 2014-04-21 NOTE — Assessment & Plan Note (Signed)
Checking on Anemia from the hospital. She had a hgb around 9 at the time. Will obtain IBC panel, ferritin, CMET, and CBC w/ diff.

## 2014-04-23 ENCOUNTER — Other Ambulatory Visit: Payer: Self-pay | Admitting: Internal Medicine

## 2014-04-23 DIAGNOSIS — N289 Disorder of kidney and ureter, unspecified: Secondary | ICD-10-CM

## 2014-04-23 DIAGNOSIS — D649 Anemia, unspecified: Secondary | ICD-10-CM

## 2014-04-23 NOTE — Progress Notes (Signed)
Orders placed for f/u labs.  

## 2014-04-29 ENCOUNTER — Other Ambulatory Visit: Payer: Commercial Managed Care - HMO

## 2014-05-02 ENCOUNTER — Other Ambulatory Visit (INDEPENDENT_AMBULATORY_CARE_PROVIDER_SITE_OTHER): Payer: Commercial Managed Care - HMO

## 2014-05-02 DIAGNOSIS — D649 Anemia, unspecified: Secondary | ICD-10-CM | POA: Diagnosis not present

## 2014-05-02 DIAGNOSIS — N289 Disorder of kidney and ureter, unspecified: Secondary | ICD-10-CM | POA: Diagnosis not present

## 2014-05-02 LAB — CBC WITH DIFFERENTIAL/PLATELET
BASOS ABS: 0 10*3/uL (ref 0.0–0.1)
Basophils Relative: 0.4 % (ref 0.0–3.0)
Eosinophils Absolute: 0.2 10*3/uL (ref 0.0–0.7)
Eosinophils Relative: 2.4 % (ref 0.0–5.0)
HEMATOCRIT: 35 % — AB (ref 36.0–46.0)
Hemoglobin: 11.7 g/dL — ABNORMAL LOW (ref 12.0–15.0)
LYMPHS ABS: 1.7 10*3/uL (ref 0.7–4.0)
LYMPHS PCT: 22.1 % (ref 12.0–46.0)
MCHC: 33.5 g/dL (ref 30.0–36.0)
MCV: 90.6 fl (ref 78.0–100.0)
Monocytes Absolute: 0.5 10*3/uL (ref 0.1–1.0)
Monocytes Relative: 6.9 % (ref 3.0–12.0)
NEUTROS PCT: 68.2 % (ref 43.0–77.0)
Neutro Abs: 5.3 10*3/uL (ref 1.4–7.7)
PLATELETS: 239 10*3/uL (ref 150.0–400.0)
RBC: 3.87 Mil/uL (ref 3.87–5.11)
RDW: 18.5 % — AB (ref 11.5–15.5)
WBC: 7.7 10*3/uL (ref 4.0–10.5)

## 2014-05-02 LAB — FERRITIN: Ferritin: 625.2 ng/mL — ABNORMAL HIGH (ref 10.0–291.0)

## 2014-05-02 LAB — VITAMIN B12: Vitamin B-12: 288 pg/mL (ref 211–911)

## 2014-05-02 LAB — BASIC METABOLIC PANEL
BUN: 24 mg/dL — AB (ref 6–23)
CALCIUM: 9.5 mg/dL (ref 8.4–10.5)
CO2: 27 meq/L (ref 19–32)
Chloride: 101 mEq/L (ref 96–112)
Creatinine, Ser: 1.02 mg/dL (ref 0.40–1.20)
GFR: 54.98 mL/min — AB (ref >60.00)
GLUCOSE: 89 mg/dL (ref 70–99)
POTASSIUM: 4.2 meq/L (ref 3.5–5.1)
Sodium: 134 mEq/L — ABNORMAL LOW (ref 135–145)

## 2014-05-15 ENCOUNTER — Ambulatory Visit (INDEPENDENT_AMBULATORY_CARE_PROVIDER_SITE_OTHER): Payer: Commercial Managed Care - HMO | Admitting: Internal Medicine

## 2014-05-15 ENCOUNTER — Ambulatory Visit
Admit: 2014-05-15 | Disposition: A | Discharge status: Home / Self Care | Payer: Self-pay | Attending: Internal Medicine | Admitting: Internal Medicine

## 2014-05-15 ENCOUNTER — Encounter: Payer: Self-pay | Admitting: Internal Medicine

## 2014-05-15 VITALS — BP 120/70 | HR 64 | Temp 98.0°F | Ht 61.0 in | Wt 140.0 lb

## 2014-05-15 DIAGNOSIS — D509 Iron deficiency anemia, unspecified: Secondary | ICD-10-CM

## 2014-05-15 DIAGNOSIS — C189 Malignant neoplasm of colon, unspecified: Secondary | ICD-10-CM | POA: Diagnosis not present

## 2014-05-15 DIAGNOSIS — I10 Essential (primary) hypertension: Secondary | ICD-10-CM | POA: Diagnosis not present

## 2014-05-15 DIAGNOSIS — M549 Dorsalgia, unspecified: Secondary | ICD-10-CM

## 2014-05-15 DIAGNOSIS — K2270 Barrett's esophagus without dysplasia: Secondary | ICD-10-CM

## 2014-05-15 DIAGNOSIS — Z3189 Encounter for other procreative management: Secondary | ICD-10-CM | POA: Diagnosis not present

## 2014-05-15 DIAGNOSIS — J189 Pneumonia, unspecified organism: Secondary | ICD-10-CM

## 2014-05-15 DIAGNOSIS — E7800 Pure hypercholesterolemia, unspecified: Secondary | ICD-10-CM

## 2014-05-15 DIAGNOSIS — J984 Other disorders of lung: Secondary | ICD-10-CM | POA: Diagnosis not present

## 2014-05-15 DIAGNOSIS — M8580 Other specified disorders of bone density and structure, unspecified site: Secondary | ICD-10-CM | POA: Diagnosis not present

## 2014-05-15 DIAGNOSIS — R351 Nocturia: Secondary | ICD-10-CM

## 2014-05-15 DIAGNOSIS — Z0000 Encounter for general adult medical examination without abnormal findings: Secondary | ICD-10-CM

## 2014-05-15 DIAGNOSIS — G8929 Other chronic pain: Secondary | ICD-10-CM

## 2014-05-15 MED ORDER — SOLIFENACIN SUCCINATE 5 MG PO TABS: 5.0000 mg | ORAL_TABLET | Freq: Every day | ORAL | Status: DC

## 2014-05-15 NOTE — Progress Notes (Signed)
Pre visit review using our clinic review tool, if applicable. No additional management support is needed unless otherwise documented below in the visit note. 

## 2014-05-18 ENCOUNTER — Encounter: Payer: Self-pay | Admitting: Internal Medicine

## 2014-05-18 DIAGNOSIS — Z8701 Personal history of pneumonia (recurrent): Secondary | ICD-10-CM | POA: Insufficient documentation

## 2014-05-18 DIAGNOSIS — Z0000 Encounter for general adult medical examination without abnormal findings: Secondary | ICD-10-CM | POA: Insufficient documentation

## 2014-05-18 NOTE — Assessment & Plan Note (Signed)
Check CEA with next labs.  Seeing GI.  Colonoscopy 07/30/11 revealed internal hemorrhoids.

## 2014-05-18 NOTE — Assessment & Plan Note (Signed)
Check vitamin D level.  Needs f/u bone density.

## 2014-05-18 NOTE — Assessment & Plan Note (Signed)
Low cholesterol diet and exercise.  Follow lipid panel and liver function tests.  On simvastatin.   

## 2014-05-18 NOTE — Assessment & Plan Note (Signed)
Has chronic back and leg pain.  Has had extensive w/up.  Seeing Dr Sharlet Salina.  Stable.  Follow.

## 2014-05-18 NOTE — Assessment & Plan Note (Signed)
Recently evaluated ER for pneumonia.  CXR with LUL opacity.  Currently better.  Due f/u cxr.

## 2014-05-18 NOTE — Assessment & Plan Note (Signed)
Persistent nocturia - increased.   Increased urinary frequency.  Needs something for her bladder.  Start vesicare 5mg  q day.  Follow.

## 2014-05-18 NOTE — Assessment & Plan Note (Signed)
Schedule for physical.  Colonoscopy 07/30/11.  Mammogram 06/13/13 - birads I.

## 2014-05-18 NOTE — Assessment & Plan Note (Signed)
Blood pressure as outlined.  Continue same medication regimen.  Follow pressures.  Follow metabolic panel.  

## 2014-05-18 NOTE — Progress Notes (Signed)
Patient ID: [name], female   DOB: 1928/12/09, 81 y.o.   MRN: [id number]   Subjective:    Patient ID: [name], female    DOB: Nov 23, 1928, 81 y.o.   MRN: [id number]  HPI  Patient here for a scheduled follow up.  Recently hospitalized for pneumonia.  Treated.  Energy has improved.  Still some fatigue.  Increased nocturia - 3-4x/night.  Increased urinary frequency.  No dysuria.  Has adjusted fluid intake.  No abdominal pain.  Back pain stable.  No increased acid reflux.  Due f/u with Dr Tiffany Kocher.  States her blood pressure is averaging 140s/78.    Past Medical History  Diagnosis Date  . Colon cancer     adenocarcinoma, s/p resection x  2  . Bowel obstruction     s/p adhesion resection  . Recurrent sinus infections   . Vertigo   . Barrett's esophagus   . Hiatal hernia   . Hypercholesteremia   . Hypertension   . Chronic back pain   . Peripheral neuropathy     Current Outpatient Prescriptions on File Prior to Visit  Medication Sig Dispense Refill  . Calcium Carbonate-Vitamin D (CALCIUM 600 + D PO) Take 600 Units by mouth daily.     . Cholecalciferol (VITAMIN D3) 2000 UNITS TABS Take 2,000 Units by mouth daily.    Marland Kitchen estradiol (ESTRACE) 2 MG tablet TAKE 1 TABLET EVERY DAY 90 tablet 1  . fexofenadine (ALLEGRA) 60 MG tablet Take 1 tablet (60 mg total) by mouth daily. 90 tablet 3  . fish oil-omega-3 fatty acids 1000 MG capsule Take 2 g by mouth daily.    Marland Kitchen lactase (LACTAID) 3000 UNITS tablet Take 1 tablet by mouth as needed.    . lidocaine (LIDODERM) 5 % Place 1 patch onto the skin as needed. Remove & Discard patch within 12 hours or as directed by MD    . lisinopril (PRINIVIL,ZESTRIL) 20 MG tablet Take 1 tablet (20 mg total) by mouth daily. 30 tablet 2  . meclizine (ANTIVERT) 25 MG tablet Take 25 mg by mouth 3 (three) times daily as needed.    . meloxicam (MOBIC) 7.5 MG tablet Take 7.5 mg by mouth daily.    . metoCLOPramide (REGLAN) 5 MG tablet Take 5 mg by mouth. Take one  tablet three times a day    . metoprolol succinate (TOPROL-XL) 25 MG 24 hr tablet TAKE 1 TABLET BY MOUTH EVERY DAY 90 tablet 1  . ondansetron (ZOFRAN) 4 MG tablet Take 1 tablet (4 mg total) by mouth every 8 (eight) hours as needed for nausea or vomiting. 30 tablet 0  . oxyCODONE (OXY IR/ROXICODONE) 5 MG immediate release tablet Take 5 mg by mouth 2 (two) times daily as needed.     . pantoprazole (PROTONIX) 40 MG tablet Take 40 mg by mouth 2 (two) times daily.    . simvastatin (ZOCOR) 10 MG tablet TAKE 1 TABLET AT BEDTIME 90 tablet 1  . traMADol-acetaminophen (ULTRACET) 37.5-325 MG per tablet Take 1 tablet by mouth every 8 (eight) hours as needed.     No current facility-administered medications on file prior to visit.    Review of Systems  Constitutional: Negative for appetite change and unexpected weight change.  HENT: Negative for congestion and sinus pressure.   Respiratory: Negative for cough, chest tightness and shortness of breath.   Cardiovascular: Negative for chest pain, palpitations and leg swelling.  Gastrointestinal: Negative for nausea, vomiting, abdominal pain and diarrhea.  Genitourinary: Negative  for dysuria and frequency.       Nocturia 3-4x/night  Musculoskeletal: Positive for back pain (chronic.  stable. ).  Skin: Negative for color change and rash.  Neurological: Negative for dizziness, light-headedness and headaches.  Psychiatric/Behavioral: Negative for dysphoric mood and agitation.       Objective:    Physical Exam  Constitutional: She appears well-developed and well-nourished. No distress.  HENT:  Nose: Nose normal.  Mouth/Throat: Oropharynx is clear and moist.  Neck: Neck supple. No thyromegaly present.  Cardiovascular: Normal rate and regular rhythm.   Pulmonary/Chest: Breath sounds normal. No respiratory distress. She has no wheezes.  Abdominal: Soft. Bowel sounds are normal. There is no tenderness.  Musculoskeletal: She exhibits no edema or tenderness.   Lymphadenopathy:    She has no cervical adenopathy.  Skin: No rash noted. No erythema.  Psychiatric: She has a normal mood and affect. Her behavior is normal.    BP 120/70 mmHg  Pulse 64  Temp(Src) 98 F (36.7 C) (Oral)  Ht 5\' 1"  (1.549 m)  Wt 140 lb (63.504 kg)  BMI 26.47 kg/m2  SpO2 96% Wt Readings from Last 3 Encounters:  05/15/14 140 lb (63.504 kg)  04/18/14 137 lb (62.143 kg)  04/10/14 134 lb 6.4 oz (60.963 kg)     Lab Results  Component Value Date   WBC 7.7 05/02/2014   HGB 11.7* 05/02/2014   HCT 35.0* 05/02/2014   PLT 239.0 05/02/2014   GLUCOSE 89 05/02/2014   CHOL 146 03/04/2014   TRIG 85.0 03/04/2014   HDL 64.00 03/04/2014   LDLCALC 65 03/04/2014   ALT 8 04/18/2014   AST 13 04/18/2014   NA 134* 05/02/2014   K 4.2 05/02/2014   CL 101 05/02/2014   CREATININE 1.02 05/02/2014   BUN 24* 05/02/2014   CO2 27 05/02/2014   TSH 1.75 05/21/2013       Assessment & Plan:   Problem List Items Addressed This Visit    Anemia, iron deficiency    Last hgb improved.  Recheck cbc and ferritin.        Relevant Orders   CBC with Differential/Platelet   Ferritin   Barrett's esophagus - Primary    Last EGD 07/30/10 revealed hiatal hernia and some changes suspicious for Barrett's. Path negative.  Reflux under control.  Sees Dr Tiffany Kocher.  Due f/u.  Request referral back to GI.        Relevant Orders   Ambulatory referral to Gastroenterology   Chronic back pain    Has chronic back and leg pain.  Has had extensive w/up.  Seeing Dr Sharlet Salina.  Stable.  Follow.       Colon cancer    Check CEA with next labs.  Seeing GI.  Colonoscopy 07/30/11 revealed internal hemorrhoids.        Relevant Orders   CEA   Health care maintenance    Schedule for physical.  Colonoscopy 07/30/11.  Mammogram 06/13/13 - birads I.        Hypercholesteremia    Low cholesterol diet and exercise.  Follow lipid panel and liver function tests.  On simvastatin.        Relevant Orders   Lipid  panel   Hepatic function panel   Hypertension    Blood pressure as outlined.  Continue same medication regimen.  Follow pressures.  Follow metabolic panel.        Relevant Orders   Basic metabolic panel   Nocturia    Persistent nocturia - increased.  Increased urinary frequency.  Needs something for her bladder.  Start vesicare 5mg  q day.  Follow.       Osteopenia    Check vitamin D level.  Needs f/u bone density.       Relevant Orders   TSH   Vit D  25 hydroxy (rtn osteoporosis monitoring)   Pneumonia    Recently evaluated ER for pneumonia.  CXR with LUL opacity.  Currently better.  Due f/u cxr.          I spent 25 minutes with the patient and more than 50% of the time was spent in consultation regarding the above.     [name], MD

## 2014-05-18 NOTE — Assessment & Plan Note (Signed)
Last EGD 07/30/10 revealed hiatal hernia and some changes suspicious for Barrett's. Path negative.  Reflux under control.  Sees Dr Tiffany Kocher.  Due f/u.  Request referral back to GI.

## 2014-05-18 NOTE — Assessment & Plan Note (Signed)
Last hgb improved.  Recheck cbc and ferritin.

## 2014-05-19 ENCOUNTER — Telehealth: Payer: Self-pay | Admitting: Internal Medicine

## 2014-05-19 NOTE — Telephone Encounter (Signed)
Pt notified & CXR order mailed to patient

## 2014-05-19 NOTE — Telephone Encounter (Signed)
Notify pt that her cxr reveals improvement, but still small residual changes.  Will need another cxr in 3-4 weeks to confirm complete clearing.  I want her to have this at the hospital as well.

## 2014-05-28 DIAGNOSIS — H5210 Myopia, unspecified eye: Secondary | ICD-10-CM | POA: Diagnosis not present

## 2014-05-28 DIAGNOSIS — H5213 Myopia, bilateral: Secondary | ICD-10-CM | POA: Diagnosis not present

## 2014-06-03 ENCOUNTER — Encounter: Payer: Self-pay | Admitting: Internal Medicine

## 2014-06-03 DIAGNOSIS — K2270 Barrett's esophagus without dysplasia: Secondary | ICD-10-CM | POA: Diagnosis not present

## 2014-06-03 DIAGNOSIS — R12 Heartburn: Secondary | ICD-10-CM | POA: Diagnosis not present

## 2014-06-13 ENCOUNTER — Ambulatory Visit
Admit: 2014-06-13 | Disposition: A | Discharge status: Home / Self Care | Payer: Self-pay | Attending: Internal Medicine | Admitting: Internal Medicine

## 2014-06-13 DIAGNOSIS — R918 Other nonspecific abnormal finding of lung field: Secondary | ICD-10-CM | POA: Diagnosis not present

## 2014-06-13 DIAGNOSIS — J189 Pneumonia, unspecified organism: Secondary | ICD-10-CM | POA: Diagnosis not present

## 2014-06-20 ENCOUNTER — Other Ambulatory Visit: Payer: Self-pay | Admitting: Internal Medicine

## 2014-07-02 ENCOUNTER — Telehealth: Payer: Self-pay | Admitting: Internal Medicine

## 2014-07-02 DIAGNOSIS — R9389 Abnormal findings on diagnostic imaging of other specified body structures: Secondary | ICD-10-CM

## 2014-07-02 NOTE — Telephone Encounter (Signed)
Notify pt that her cxr reveals improvement.  Still some residual changes.  Needs a f/u cxr at [facility] in 3 weeks.  Order placed for cxr.

## 2014-07-03 NOTE — Telephone Encounter (Signed)
Called and advised patient of results,  verbalized understanding. 

## 2014-07-07 DIAGNOSIS — M4726 Other spondylosis with radiculopathy, lumbar region: Secondary | ICD-10-CM | POA: Diagnosis not present

## 2014-07-07 DIAGNOSIS — M5136 Other intervertebral disc degeneration, lumbar region: Secondary | ICD-10-CM | POA: Diagnosis not present

## 2014-07-07 DIAGNOSIS — M5416 Radiculopathy, lumbar region: Secondary | ICD-10-CM | POA: Diagnosis not present

## 2014-07-07 DIAGNOSIS — M62830 Muscle spasm of back: Secondary | ICD-10-CM | POA: Diagnosis not present

## 2014-07-10 ENCOUNTER — Emergency Department
Admission: EM | Admit: 2014-07-10 | Discharge: 2014-07-10 | Disposition: A | Discharge status: Home / Self Care | Payer: Commercial Managed Care - HMO | Attending: Emergency Medicine | Admitting: Emergency Medicine

## 2014-07-10 ENCOUNTER — Encounter: Payer: Self-pay | Admitting: Surgery

## 2014-07-10 DIAGNOSIS — M549 Dorsalgia, unspecified: Secondary | ICD-10-CM | POA: Insufficient documentation

## 2014-07-10 DIAGNOSIS — K644 Residual hemorrhoidal skin tags: Secondary | ICD-10-CM | POA: Insufficient documentation

## 2014-07-10 DIAGNOSIS — K5900 Constipation, unspecified: Secondary | ICD-10-CM | POA: Insufficient documentation

## 2014-07-10 DIAGNOSIS — G8929 Other chronic pain: Secondary | ICD-10-CM | POA: Insufficient documentation

## 2014-07-10 DIAGNOSIS — I10 Essential (primary) hypertension: Secondary | ICD-10-CM | POA: Insufficient documentation

## 2014-07-10 DIAGNOSIS — Z793 Long term (current) use of hormonal contraceptives: Secondary | ICD-10-CM | POA: Insufficient documentation

## 2014-07-10 DIAGNOSIS — Z79899 Other long term (current) drug therapy: Secondary | ICD-10-CM | POA: Diagnosis not present

## 2014-07-10 DIAGNOSIS — K649 Unspecified hemorrhoids: Secondary | ICD-10-CM | POA: Diagnosis present

## 2014-07-10 DIAGNOSIS — M62830 Muscle spasm of back: Secondary | ICD-10-CM | POA: Diagnosis not present

## 2014-07-10 LAB — COMPREHENSIVE METABOLIC PANEL
ALT: 25 U/L (ref 14–54)
ANION GAP: 10 (ref 5–15)
AST: 34 U/L (ref 15–41)
Albumin: 4.4 g/dL (ref 3.5–5.0)
Alkaline Phosphatase: 60 U/L (ref 38–126)
BUN: 25 mg/dL — ABNORMAL HIGH (ref 6–20)
CALCIUM: 9.6 mg/dL (ref 8.9–10.3)
CO2: 25 mmol/L (ref 22–32)
Chloride: 102 mmol/L (ref 101–111)
Creatinine, Ser: 0.91 mg/dL (ref 0.44–1.00)
GFR calc Af Amer: >60 mL/min (ref >60)
GFR, EST NON AFRICAN AMERICAN: 57 mL/min — AB (ref >60)
Glucose, Bld: 136 mg/dL — ABNORMAL HIGH (ref 65–99)
Potassium: 3.7 mmol/L (ref 3.5–5.1)
Sodium: 137 mmol/L (ref 135–145)
TOTAL PROTEIN: 7.5 g/dL (ref 6.5–8.1)
Total Bilirubin: 1.3 mg/dL — ABNORMAL HIGH (ref 0.3–1.2)

## 2014-07-10 LAB — CBC
HEMATOCRIT: 44.9 % (ref 35.0–47.0)
HEMOGLOBIN: 15 g/dL (ref 12.0–16.0)
MCH: 30.7 pg (ref 26.0–34.0)
MCHC: 33.5 g/dL (ref 32.0–36.0)
MCV: 91.9 fL (ref 80.0–100.0)
Platelets: 223 10*3/uL (ref 150–440)
RBC: 4.89 MIL/uL (ref 3.80–5.20)
RDW: 14.1 % (ref 11.5–14.5)
WBC: 15.5 10*3/uL — ABNORMAL HIGH (ref 3.6–11.0)

## 2014-07-10 MED ORDER — DOCUSATE SODIUM 100 MG PO CAPS: 200.0000 mg | ORAL_CAPSULE | Freq: Two times a day (BID) | ORAL | Status: DC

## 2014-07-10 MED ORDER — TUCKS 50 % EX PADS: 1.0000 "application " | MEDICATED_PAD | CUTANEOUS | Status: DC

## 2014-07-10 MED ORDER — COLACE 100 MG PO CAPS: 100.0000 mg | ORAL_CAPSULE | Freq: Two times a day (BID) | ORAL | Status: DC

## 2014-07-10 MED ORDER — LIDOCAINE HCL 4 % EX SOLN: Freq: Two times a day (BID) | CUTANEOUS | Status: DC | PRN

## 2014-07-10 MED ORDER — HYDROCORTISONE ACE-PRAMOXINE 1-1 % RE FOAM: 1.0000 | Freq: Three times a day (TID) | RECTAL | Status: DC

## 2014-07-10 NOTE — ED Notes (Signed)
Patient c/o constipation and hemorhoids. Hx colon CA and bowel obstruction.

## 2014-07-10 NOTE — Discharge Instructions (Signed)
Constipation °Constipation is when a person has fewer than three bowel movements a week, has difficulty having a bowel movement, or has stools that are dry, hard, or larger than normal. As people grow older, constipation is more common. If you try to fix constipation with medicines that make you have a bowel movement (laxatives), the problem may get worse. Long-term laxative use may cause the muscles of the colon to become weak. A low-fiber diet, not taking in enough fluids, and taking certain medicines may make constipation worse.  °CAUSES  °· Certain medicines, such as antidepressants, pain medicine, iron supplements, antacids, and water pills.   °· Certain diseases, such as diabetes, irritable bowel syndrome (IBS), thyroid disease, or depression.   °· Not drinking enough water.   °· Not eating enough fiber-rich foods.   °· Stress or travel.   °· Lack of physical activity or exercise.   °· Ignoring the urge to have a bowel movement.   °· Using laxatives too much.   °SIGNS AND SYMPTOMS  °· Having fewer than three bowel movements a week.   °· Straining to have a bowel movement.   °· Having stools that are hard, dry, or larger than normal.   °· Feeling full or bloated.   °· Pain in the lower abdomen.   °· Not feeling relief after having a bowel movement.   °DIAGNOSIS  °Your health care provider will take a medical history and perform a physical exam. Further testing may be done for severe constipation. Some tests may include: °· A barium enema X-ray to examine your rectum, colon, and, sometimes, your small intestine.   °· A sigmoidoscopy to examine your lower colon.   °· A colonoscopy to examine your entire colon. °TREATMENT  °Treatment will depend on the severity of your constipation and what is causing it. Some dietary treatments include drinking more fluids and eating more fiber-rich foods. Lifestyle treatments may include regular exercise. If these diet and lifestyle recommendations do not help, your health care  provider may recommend taking over-the-counter laxative medicines to help you have bowel movements. Prescription medicines may be prescribed if over-the-counter medicines do not work.  °HOME CARE INSTRUCTIONS  °· Eat foods that have a lot of fiber, such as fruits, vegetables, whole grains, and beans. °· Limit foods high in fat and processed sugars, such as french fries, hamburgers, cookies, candies, and soda.   °· A fiber supplement may be added to your diet if you cannot get enough fiber from foods.   °· Drink enough fluids to keep your urine clear or pale yellow.   °· Exercise regularly or as directed by your health care provider.   °· Go to the restroom when you have the urge to go. Do not hold it.   °· Only take over-the-counter or prescription medicines as directed by your health care provider. Do not take other medicines for constipation without talking to your health care provider first.   °SEEK IMMEDIATE MEDICAL CARE IF:  °· You have bright red blood in your stool.   °· Your constipation lasts for more than 4 days or gets worse.   °· You have abdominal or rectal pain.   °· You have thin, pencil-like stools.   °· You have unexplained weight loss. °MAKE SURE YOU:  °· Understand these instructions. °· Will watch your condition. °· Will get help right away if you are not doing well or get worse. °Document Released: 10/30/2003 Document Revised: 02/05/2013 Document Reviewed: 11/12/2012 °[employer]® Patient Information ©2015 [employer]. This information is not intended to replace advice given to you by your health care provider. Make sure you discuss any questions   you have with your health care provider.  Hemorrhoids Hemorrhoids are swollen veins around the rectum or anus. There are two types of hemorrhoids:   Internal hemorrhoids. These occur in the veins just inside the rectum. They may poke through to the outside and become irritated and painful.  External hemorrhoids. These occur in the veins outside  the anus and can be felt as a painful swelling or hard lump near the anus. CAUSES  Pregnancy.   Obesity.   Constipation or diarrhea.   Straining to have a bowel movement.   Sitting for long periods on the toilet.  Heavy lifting or other activity that caused you to strain.  Anal intercourse. SYMPTOMS   Pain.   Anal itching or irritation.   Rectal bleeding.   Fecal leakage.   Anal swelling.   One or more lumps around the anus.  DIAGNOSIS  Your caregiver may be able to diagnose hemorrhoids by visual examination. Other examinations or tests that may be performed include:   Examination of the rectal area with a gloved hand (digital rectal exam).   Examination of anal canal using a small tube (scope).   A blood test if you have lost a significant amount of blood.  A test to look inside the colon (sigmoidoscopy or colonoscopy). TREATMENT Most hemorrhoids can be treated at home. However, if symptoms do not seem to be getting better or if you have a lot of rectal bleeding, your caregiver may perform a procedure to help make the hemorrhoids get smaller or remove them completely. Possible treatments include:   Placing a rubber band at the base of the hemorrhoid to cut off the circulation (rubber band ligation).   Injecting a chemical to shrink the hemorrhoid (sclerotherapy).   Using a tool to burn the hemorrhoid (infrared light therapy).   Surgically removing the hemorrhoid (hemorrhoidectomy).   Stapling the hemorrhoid to block blood flow to the tissue (hemorrhoid stapling).  HOME CARE INSTRUCTIONS   Eat foods with fiber, such as whole grains, beans, nuts, fruits, and vegetables. Ask your doctor about taking products with added fiber in them (fibersupplements).  Increase fluid intake. Drink enough water and fluids to keep your urine clear or pale yellow.   Exercise regularly.   Go to the bathroom when you have the urge to have a bowel movement. Do  not wait.   Avoid straining to have bowel movements.   Keep the anal area dry and clean. Use wet toilet paper or moist towelettes after a bowel movement.   Medicated creams and suppositories may be used or applied as directed.   Only take over-the-counter or prescription medicines as directed by your caregiver.   Take warm sitz baths for 15-20 minutes, 3-4 times a day to ease pain and discomfort.   Place ice packs on the hemorrhoids if they are tender and swollen. Using ice packs between sitz baths may be helpful.   Put ice in a plastic bag.   Place a towel between your skin and the bag.   Leave the ice on for 15-20 minutes, 3-4 times a day.   Do not use a donut-shaped pillow or sit on the toilet for long periods. This increases blood pooling and pain.  SEEK MEDICAL CARE IF:  You have increasing pain and swelling that is not controlled by treatment or medicine.  You have uncontrolled bleeding.  You have difficulty or you are unable to have a bowel movement.  You have pain or inflammation outside the area of  the hemorrhoids. MAKE SURE YOU:  Understand these instructions.  Will watch your condition.  Will get help right away if you are not doing well or get worse. Document Released: 01/29/2000 Document Revised: 01/18/2012 Document Reviewed: 12/06/2011 [facility] Patient Information 2015 [place], [place]. This information is not intended to replace advice given to you by your health care provider. Make sure you discuss any questions you have with your health care provider.

## 2014-07-10 NOTE — ED Provider Notes (Signed)
[facility] Emergency Department Provider Note  ____________________________________________  Time seen: 3:30 PM  I have reviewed the triage vital signs and the nursing notes.   HISTORY  Chief Complaint Hemorrhoids and Constipation    HPI [name] is a 81 y.o. female who complains of rectal hemorrhoid pain for the past 5 days, gradually worsening. Because of the pain she has been withholding her bowel movements because she knows that it will be painful with the hemorrhoids. As a result, her stools are feeling very hard and uncomfortable, causing her to continue withholding. She has had some small liquid stool leaking but no bowel movement in 5 days. No vomiting or fever. She does have a history of bowel surgery and bowel obstruction that was treated nonoperatively by the [facility] clinic surgery group 3 or 4 years ago. She reports she is eating and drinking normally today.     Past Medical History  Diagnosis Date  . Colon cancer     adenocarcinoma, s/p resection x  2  . Bowel obstruction     s/p adhesion resection  . Recurrent sinus infections   . Vertigo   . Barrett's esophagus   . Hiatal hernia   . Hypercholesteremia   . Hypertension   . Chronic back pain   . Peripheral neuropathy     Patient Active Problem List   Diagnosis Date Noted  . Health care maintenance 05/18/2014  . Pneumonia 05/18/2014  . Anemia, iron deficiency 04/21/2014  . Hospital discharge follow-up 04/14/2014  . Sweats, menopausal 03/07/2014  . Face lesion 11/03/2013  . Numbness in feet 10/08/2012  . Nocturia 10/08/2012  . Hypertension 12/18/2011  . Hypercholesteremia 12/18/2011  . Colon cancer 12/18/2011  . Barrett's esophagus 12/18/2011  . Chronic back pain 12/18/2011  . Osteopenia 12/18/2011    Past Surgical History  Procedure Laterality Date  . Colon resection      x2.  s/p colon cancer  . Adhesions resected      bowel obstruction  . Cholecystectomy    .  Abdominal hysterectomy  1982  . Appendectomy    . Excisional hemorrhoidectomy      with tubal ligation  . Breast biopsy      Current Outpatient Rx  Name  Route  Sig  Dispense  Refill  . Calcium Carbonate-Vitamin D (CALCIUM 600 + D PO)   Oral   Take 600 Units by mouth daily.          . Cholecalciferol (VITAMIN D3) 2000 UNITS TABS   Oral   Take 2,000 Units by mouth daily.         Marland Kitchen docusate sodium (COLACE) 100 MG capsule   Oral   Take 2 capsules (200 mg total) by mouth 2 (two) times daily.   120 capsule   0   . estradiol (ESTRACE) 2 MG tablet      TAKE 1 TABLET EVERY DAY   90 tablet   1   . fexofenadine (ALLEGRA) 60 MG tablet      TAKE 1 TABLET EVERY DAY   90 tablet   3   . fish oil-omega-3 fatty acids 1000 MG capsule   Oral   Take 2 g by mouth daily.         Marland Kitchen lactase (LACTAID) 3000 UNITS tablet   Oral   Take 1 tablet by mouth as needed.         . lidocaine (LIDODERM) 5 %   Transdermal   Place 1 patch  onto the skin as needed. Remove & Discard patch within 12 hours or as directed by MD         . lisinopril (PRINIVIL,ZESTRIL) 20 MG tablet   Oral   Take 1 tablet (20 mg total) by mouth daily.   30 tablet   2   . meclizine (ANTIVERT) 25 MG tablet   Oral   Take 25 mg by mouth 3 (three) times daily as needed.         . meloxicam (MOBIC) 7.5 MG tablet   Oral   Take 7.5 mg by mouth daily.         . metoCLOPramide (REGLAN) 5 MG tablet   Oral   Take 5 mg by mouth. Take one tablet three times a day         . metoprolol succinate (TOPROL-XL) 25 MG 24 hr tablet      TAKE 1 TABLET BY MOUTH EVERY DAY   90 tablet   1   . ondansetron (ZOFRAN) 4 MG tablet   Oral   Take 1 tablet (4 mg total) by mouth every 8 (eight) hours as needed for nausea or vomiting.   30 tablet   0   . oxyCODONE (OXY IR/ROXICODONE) 5 MG immediate release tablet   Oral   Take 5 mg by mouth 2 (two) times daily as needed.          . pantoprazole (PROTONIX) 40 MG  tablet   Oral   Take 40 mg by mouth 2 (two) times daily.         . simvastatin (ZOCOR) 10 MG tablet      TAKE 1 TABLET AT BEDTIME   90 tablet   1   . solifenacin (VESICARE) 5 MG tablet   Oral   Take 1 tablet (5 mg total) by mouth daily.   30 tablet   1   . traMADol-acetaminophen (ULTRACET) 37.5-325 MG per tablet   Oral   Take 1 tablet by mouth every 8 (eight) hours as needed.         [name] (TUCKS) 50 % PADS   Apply externally   Apply 1 application topically every 4 (four) hours.   40 each   3     Allergies Review of patient's allergies indicates no known allergies.  Family History  Problem Relation Age of Onset  . Breast cancer Mother   . Stroke Father   . Hypertension Father   . Diabetes Father   . Asthma Mother   . Ovarian cancer Sister     x2  . Prostate cancer Brother   . Hypertension Brother     x3  . Hypercholesterolemia Brother     x3  . Diabetes Brother     Social History History  Substance Use Topics  . Smoking status: Never Smoker   . Smokeless tobacco: Never Used  . Alcohol Use: No    Review of Systems  Constitutional: No fever or chills. No weight changes Eyes:No blurry vision or double vision.  ENT: No sore throat. Cardiovascular: No chest pain. Respiratory: No dyspnea or cough. Gastrointestinal: Negative for abdominal pain, vomiting and diarrhea.  No BRBPR or melena. Positive nausea yesterday, positive rectal pain with hemorrhoids Genitourinary: Negative for dysuria, urinary retention, bloody urine, or difficulty urinating. Musculoskeletal: Negative for back pain. No joint swelling or pain. Skin: Negative for rash. Neurological: Negative for headaches, focal weakness or numbness. Psychiatric:No anxiety or depression.   Endocrine:No hot/cold intolerance, changes in energy,  or sleep difficulty.  10-point ROS otherwise negative.  ____________________________________________   PHYSICAL EXAM:  VITAL SIGNS: ED Triage  Vitals  Enc Vitals Group     BP 07/10/14 1455 157/64 mmHg     Pulse Rate 07/10/14 1455 84     Resp 07/10/14 1455 16     Temp 07/10/14 1455 98.3 F (36.8 C)     Temp Source 07/10/14 1455 Oral     SpO2 07/10/14 1455 96 %     Weight 07/10/14 1455 138 lb (62.596 kg)     Height 07/10/14 1455 5\' 3"  (1.6 m)     Head Cir --      Peak Flow --      Pain Score 07/10/14 1456 8     Pain Loc --      Pain Edu? --      Excl. in [place]? --      Constitutional: Alert and oriented. Well appearing and in no distress. Eyes: No scleral icterus. No conjunctival pallor. PERRL. EOMI ENT   Head: Normocephalic and atraumatic.   Nose: No congestion/rhinnorhea. No septal hematoma   Mouth/Throat: MMM, no pharyngeal erythema. No peritonsillar mass. No uvula shift.   Neck: No stridor. No SubQ emphysema. No meningismus. Hematological/Lymphatic/Immunilogical: No cervical lymphadenopathy. Cardiovascular: RRR. Normal and symmetric distal pulses are present in all extremities. No murmurs, rubs, or gallops. Respiratory: Normal respiratory effort without tachypnea nor retractions. Breath sounds are clear and equal bilaterally. No wheezes/rales/rhonchi. Gastrointestinal: Soft and nontender. No distention. There is no CVA tenderness.  No rebound, rigidity, or guarding. Rectal exam reveals several large engorged external hemorrhoids that are tender to the touch and friable. There is a large amount of bright impacted stool in the rectal vault which I manually disimpacted. Upon disimpaction, some of the hemorrhoids are oozing blood due to the friability. Genitourinary: deferred Musculoskeletal: Nontender with normal range of motion in all extremities. No joint effusions.  No lower extremity tenderness.  No edema. Neurologic:   Normal speech and language.  CN 2-10 normal. Motor grossly intact. No pronator drift.  Normal gait. No gross focal neurologic deficits are appreciated.  Skin:  Skin is warm, dry and intact.  No rash noted.  No petechiae, purpura, or bullae. Psychiatric: Mood and affect are normal. Speech and behavior are normal. Patient exhibits appropriate insight and judgment.  ____________________________________________    LABS (pertinent positives/negatives) (all labs ordered are listed, but only abnormal results are displayed) Labs Reviewed  CBC - Abnormal; Notable for the following:    WBC 15.5 (*)    All other components within normal limits  COMPREHENSIVE METABOLIC PANEL - Abnormal; Notable for the following:    Glucose, Bld 136 (*)    BUN 25 (*)    Total Bilirubin 1.3 (*)    GFR calc non Af Amer 57 (*)    All other components within normal limits   ____________________________________________   EKG    ____________________________________________    RADIOLOGY    ____________________________________________   PROCEDURES Manual disimpaction of rectum Large amount of stool removed Tolerated well ____________________________________________   INITIAL IMPRESSION / ASSESSMENT AND PLAN / ED COURSE  Pertinent labs & imaging results that were available during my care of the patient were reviewed by me and considered in my medical decision making (see chart for details).  Patient presents with severe external hemorrhoids that are friable and bleeding. This is related to constipation which I disimpacted. Due to the severity of her hemorrhoids, a called surgery and discussed with  Dr. Marina Gravel who noted that the patient's previously been taking care of by the [facility] clinic group and recommended I discuss with Dr. Tamala Julian who is on-call for the group today. I discussed the patient's case with Dr. Iran Planas at 4:00 who noted that he is not going to surgery and will not have time to see the patient today and asked that I actually consult Dr. Marina Gravel. Dr. Marina Gravel notes he is also going to start a surgical case soon and will consult when available.  Patient is well-appearing no acute  distress. Tolerated disimpaction well, no evidence of obstruction sepsis perforation or abscess.  ----------------------------------------- 5:17 PM on 07/10/2014 -----------------------------------------  Discussed with Dr. Marina Gravel in the ED after his evaluation of the patient he notes that the patient does not require any emergent intervention and can be discharged home with prescriptions. I'll prescribe tucks and Colace and have her follow up with information on managing hemorrhoids. ____________________________________________   FINAL CLINICAL IMPRESSION(S) / ED DIAGNOSES  Final diagnoses:  Constipation, unspecified constipation type  External hemorrhoids      Carrie Mew, MD 07/10/14 1718

## 2014-07-10 NOTE — Consult Note (Signed)
Patient ID: [name], female   DOB: 1928-08-02, 81 y.o.   MRN: [id number]  Chief Complaint  Patient presents with  . Hemorrhoids  . Constipation    HPI [name] is a 81 y.o. female.   HPI    81 year old white female referred to the emergency room for increasing pain related to hemorrhoids. The patient states that she had a hemorrhoid operation 1975. Since then she has had intermittent problems. Over the last several days the patient has been seen in the chronic pain clinic and has had several epidural injections for her chronic back pain. She's been taking a large amount of pain medication and has developed constipation as a result of such. This morning she had worsening pain with a difficult bowel movement along with some scant bleeding and a new significant amount of swelling.   Her family brought her to the emergency room for evaluation. Of note the patient is a prior patient of [facility] from colon cancer resection 2. Dr. Rochel Brome was unavailable to see the patient. Dr. Joni Fears to the emergency room called me regarding significant hemorrhoidal swelling. He asked for my consultation. The patient states that prior to 2 days ago she was not having any hemorrhoidal symptoms at all.  She's had no fevers. He is accompanied by her husband and 2 daughters.  Past Medical History  Diagnosis Date  . Colon cancer     adenocarcinoma, s/p resection x  2  . Bowel obstruction     s/p adhesion resection  . Recurrent sinus infections   . Vertigo   . Barrett's esophagus   . Hiatal hernia   . Hypercholesteremia   . Hypertension   . Chronic back pain   . Peripheral neuropathy     Past Surgical History  Procedure Laterality Date  . Colon resection      x2.  s/p colon cancer  . Adhesions resected      bowel obstruction  . Cholecystectomy    . Abdominal hysterectomy  1982  . Appendectomy    . Excisional hemorrhoidectomy      with tubal ligation  . Breast biopsy       Family History  Problem Relation Age of Onset  . Breast cancer Mother   . Stroke Father   . Hypertension Father   . Diabetes Father   . Asthma Mother   . Ovarian cancer Sister     x2  . Prostate cancer Brother   . Hypertension Brother     x3  . Hypercholesterolemia Brother     x3  . Diabetes Brother     Social History History  Substance Use Topics  . Smoking status: Never Smoker   . Smokeless tobacco: Never Used  . Alcohol Use: No    No Known Allergies  No current facility-administered medications for this encounter.   Current Outpatient Prescriptions  Medication Sig Dispense Refill  . Calcium Carbonate-Vitamin D (CALCIUM 600 + D PO) Take 600 Units by mouth daily.     . Cholecalciferol (VITAMIN D3) 2000 UNITS TABS Take 2,000 Units by mouth daily.    Marland Kitchen estradiol (ESTRACE) 2 MG tablet TAKE 1 TABLET EVERY DAY 90 tablet 1  . fexofenadine (ALLEGRA) 60 MG tablet TAKE 1 TABLET EVERY DAY 90 tablet 3  . fish oil-omega-3 fatty acids 1000 MG capsule Take 2 g by mouth daily.    Marland Kitchen lactase (LACTAID) 3000 UNITS tablet Take 1 tablet by mouth as needed.    Marland Kitchen  lidocaine (LIDODERM) 5 % Place 1 patch onto the skin as needed. Remove & Discard patch within 12 hours or as directed by MD    . lisinopril (PRINIVIL,ZESTRIL) 20 MG tablet Take 1 tablet (20 mg total) by mouth daily. 30 tablet 2  . meclizine (ANTIVERT) 25 MG tablet Take 25 mg by mouth 3 (three) times daily as needed.    . meloxicam (MOBIC) 7.5 MG tablet Take 7.5 mg by mouth daily.    . metoCLOPramide (REGLAN) 5 MG tablet Take 5 mg by mouth. Take one tablet three times a day    . metoprolol succinate (TOPROL-XL) 25 MG 24 hr tablet TAKE 1 TABLET BY MOUTH EVERY DAY 90 tablet 1  . ondansetron (ZOFRAN) 4 MG tablet Take 1 tablet (4 mg total) by mouth every 8 (eight) hours as needed for nausea or vomiting. 30 tablet 0  . oxyCODONE (OXY IR/ROXICODONE) 5 MG immediate release tablet Take 5 mg by mouth 2 (two) times daily as needed.     .  pantoprazole (PROTONIX) 40 MG tablet Take 40 mg by mouth 2 (two) times daily.    . simvastatin (ZOCOR) 10 MG tablet TAKE 1 TABLET AT BEDTIME 90 tablet 1  . solifenacin (VESICARE) 5 MG tablet Take 1 tablet (5 mg total) by mouth daily. 30 tablet 1  . traMADol-acetaminophen (ULTRACET) 37.5-325 MG per tablet Take 1 tablet by mouth every 8 (eight) hours as needed.        Review of Systems A 10 point review of systems was asked and was negative except for the following positive findings :  Rectal pain and anal bleeding.  Blood pressure 157/64, pulse 84, temperature 98.3 F (36.8 C), temperature source Oral, resp. rate 16, height 5\' 3"  (1.6 m), weight 62.596 kg (138 lb), SpO2 96 %.  Physical Exam CONSTITUTIONAL:  Pleasant, well-developed, well-nourished, and in no acute distress. EYES: Pupils equal and reactive to light, Sclera non-icteric EARS, NOSE, MOUTH AND THROAT:  The oropharynx was clear.  Dentition is good repair.  Oral mucosa pink and moist. LYMPH NODES:  Lymph nodes in the neck and axillae were normal RESPIRATORY:  Lungs were clear.  Normal respiratory effort without pathologic use of accessory muscles of respiration CARDIOVASCULAR: Heart was regular without murmurs.  There were no carotid bruits. GI: The abdomen was soft, non tender, and non distended. There were no palpable masses. There was no hepatosplenomegaly. There were normal bowel sounds in all quadrants. GU:  There is a circumferential rosette of inflamed external hemorrhoidal tissue. MUSCULOSKELETAL:  Normal muscle strength and tone.  No clubbing or cyanosis.   SKIN:  There were no pathologic skin lesions.  There were no nodules on palpation. NEUROLOGIC:  Sensation is normal.  Cranial nerves are grossly intact. PSYCH:  Oriented to person, place and time.  Mood and affect are normal.    Assessment    Significant external inflamed hemorrhoids. Recent constipation secondary to narcotic dependence.     Plan    The  patient does not require operative intervention. I recommended sitz baths, 5% lidocaine ointment, and Proctofoam HC along with MiraLAX or Colace for constipation.       I will see the patient back in the office in 7-10 days. She is in agreement with this plan along with her family.   Marriah Sanderlin, [place] 07/10/2014, 5:15 PM

## 2014-07-17 ENCOUNTER — Ambulatory Visit (INDEPENDENT_AMBULATORY_CARE_PROVIDER_SITE_OTHER): Payer: Commercial Managed Care - HMO | Admitting: Internal Medicine

## 2014-07-17 ENCOUNTER — Ambulatory Visit
Admission: RE | Admit: 2014-07-17 | Discharge: 2014-07-17 | Disposition: A | Discharge status: Home / Self Care | Payer: Commercial Managed Care - HMO | Source: Ambulatory Visit | Attending: Internal Medicine | Admitting: Internal Medicine

## 2014-07-17 ENCOUNTER — Encounter: Payer: Self-pay | Admitting: Internal Medicine

## 2014-07-17 VITALS — BP 120/70 | HR 72 | Temp 98.7°F | Ht 61.0 in | Wt 132.2 lb

## 2014-07-17 DIAGNOSIS — J189 Pneumonia, unspecified organism: Secondary | ICD-10-CM | POA: Insufficient documentation

## 2014-07-17 DIAGNOSIS — K449 Diaphragmatic hernia without obstruction or gangrene: Secondary | ICD-10-CM | POA: Diagnosis not present

## 2014-07-17 DIAGNOSIS — G8929 Other chronic pain: Secondary | ICD-10-CM

## 2014-07-17 DIAGNOSIS — Z09 Encounter for follow-up examination after completed treatment for conditions other than malignant neoplasm: Secondary | ICD-10-CM | POA: Insufficient documentation

## 2014-07-17 DIAGNOSIS — I10 Essential (primary) hypertension: Secondary | ICD-10-CM

## 2014-07-17 DIAGNOSIS — Z0000 Encounter for general adult medical examination without abnormal findings: Secondary | ICD-10-CM

## 2014-07-17 DIAGNOSIS — K2270 Barrett's esophagus without dysplasia: Secondary | ICD-10-CM

## 2014-07-17 DIAGNOSIS — D71 Functional disorders of polymorphonuclear neutrophils: Secondary | ICD-10-CM | POA: Insufficient documentation

## 2014-07-17 DIAGNOSIS — D509 Iron deficiency anemia, unspecified: Secondary | ICD-10-CM

## 2014-07-17 DIAGNOSIS — E7800 Pure hypercholesterolemia, unspecified: Secondary | ICD-10-CM

## 2014-07-17 DIAGNOSIS — C189 Malignant neoplasm of colon, unspecified: Secondary | ICD-10-CM | POA: Diagnosis not present

## 2014-07-17 DIAGNOSIS — J181 Lobar pneumonia, unspecified organism: Secondary | ICD-10-CM | POA: Diagnosis not present

## 2014-07-17 DIAGNOSIS — Z1239 Encounter for other screening for malignant neoplasm of breast: Secondary | ICD-10-CM

## 2014-07-17 DIAGNOSIS — M549 Dorsalgia, unspecified: Secondary | ICD-10-CM

## 2014-07-17 NOTE — Progress Notes (Signed)
Pre visit review using our clinic review tool, if applicable. No additional management support is needed unless otherwise documented below in the visit note. 

## 2014-07-17 NOTE — Progress Notes (Signed)
Patient ID: [name], female   DOB: 11-19-1928, 81 y.o.   MRN: [id number]   Subjective:    Patient ID: [name], female    DOB: 1928-09-15, 81 y.o.   MRN: [id number]  HPI  Patient here for a scheduled follow up.  Went to ER 07/10/14.  Was impacted.  Had increased pain with hemorrhoids.  See ER note for details.  Was disimpacted.  Dr [name] evaluated hemorrhoids.  She is taking colace now.  Bowels are better.  Hemorrhoids better.  May still have spot of blood with wiping, but overall much improved.  Reflux is better.  Watching diet.  She has lost weight.  Is eating.  No nausea or vomiting.  No increased cough or congestion.  Previous hospitalization for pneumonia.  CXR recently revealed some residual changes.  Recommended f/u cxr.  Will do today.     Past Medical History  Diagnosis Date  . Colon cancer     adenocarcinoma, s/p resection x  2  . Bowel obstruction     s/p adhesion resection  . Recurrent sinus infections   . Vertigo   . Barrett's esophagus   . Hiatal hernia   . Hypercholesteremia   . Hypertension   . Chronic back pain   . Peripheral neuropathy     Current Outpatient Prescriptions on File Prior to Visit  Medication Sig Dispense Refill  . Calcium Carbonate-Vitamin D (CALCIUM 600 + D PO) Take 600 Units by mouth daily.     . Cholecalciferol (VITAMIN D3) 2000 UNITS TABS Take 2,000 Units by mouth daily.    Marland Kitchen COLACE 100 MG capsule Take 1 capsule (100 mg total) by mouth 2 (two) times daily. 60 capsule 0  . docusate sodium (COLACE) 100 MG capsule Take 2 capsules (200 mg total) by mouth 2 (two) times daily. 120 capsule 0  . estradiol (ESTRACE) 2 MG tablet TAKE 1 TABLET EVERY DAY 90 tablet 1  . fexofenadine (ALLEGRA) 60 MG tablet TAKE 1 TABLET EVERY DAY 90 tablet 3  . fish oil-omega-3 fatty acids 1000 MG capsule Take 2 g by mouth daily.    . hydrocortisone-pramoxine (PROCTOFOAM-HC) rectal foam Place 1 applicator rectally 3 (three) times daily. Use topically not inserted  into anal canal. 10 g 1  . lactase (LACTAID) 3000 UNITS tablet Take 1 tablet by mouth as needed.    . lidocaine (LIDODERM) 5 % Place 1 patch onto the skin as needed. Remove & Discard patch within 12 hours or as directed by MD    . lidocaine (XYLOCAINE) 4 % external solution Apply topically 2 (two) times daily as needed. 50 mL 1  . lisinopril (PRINIVIL,ZESTRIL) 20 MG tablet Take 1 tablet (20 mg total) by mouth daily. 30 tablet 2  . meclizine (ANTIVERT) 25 MG tablet Take 25 mg by mouth 3 (three) times daily as needed.    . meloxicam (MOBIC) 7.5 MG tablet Take 7.5 mg by mouth daily.    . metoCLOPramide (REGLAN) 5 MG tablet Take 5 mg by mouth. Take one tablet three times a day    . metoprolol succinate (TOPROL-XL) 25 MG 24 hr tablet TAKE 1 TABLET BY MOUTH EVERY DAY 90 tablet 1  . ondansetron (ZOFRAN) 4 MG tablet Take 1 tablet (4 mg total) by mouth every 8 (eight) hours as needed for nausea or vomiting. 30 tablet 0  . oxyCODONE (OXY IR/ROXICODONE) 5 MG immediate release tablet Take 5 mg by mouth 2 (two) times daily as needed.     Marland Kitchen  pantoprazole (PROTONIX) 40 MG tablet Take 40 mg by mouth 2 (two) times daily.    . simvastatin (ZOCOR) 10 MG tablet TAKE 1 TABLET AT BEDTIME 90 tablet 1  . solifenacin (VESICARE) 5 MG tablet Take 1 tablet (5 mg total) by mouth daily. 30 tablet 1  . traMADol-acetaminophen (ULTRACET) 37.5-325 MG per tablet Take 1 tablet by mouth every 8 (eight) hours as needed.    [name] (TUCKS) 50 % PADS Apply 1 application topically every 4 (four) hours. 40 each 3   No current facility-administered medications on file prior to visit.    Review of Systems  Constitutional: Negative for appetite change.       Some weight loss.    HENT: Negative for congestion and sinus pressure.   Respiratory: Negative for cough, chest tightness and shortness of breath.   Cardiovascular: Negative for chest pain, palpitations and leg swelling.  Gastrointestinal: Negative for nausea, vomiting and  abdominal pain.       Bowels doing better now.  On colace.  Hemorrhoids better.   Genitourinary: Negative for dysuria and difficulty urinating.  Musculoskeletal: Positive for back pain.  Skin: Negative for color change and rash.  Neurological: Negative for dizziness, light-headedness and headaches.  Hematological: Negative for adenopathy. Does not bruise/bleed easily.  Psychiatric/Behavioral: Negative for dysphoric mood and agitation.       Objective:     Blood pressure recheck:  138/62  Physical Exam  Constitutional: No distress.  HENT:  Nose: Nose normal.  Mouth/Throat: Oropharynx is clear and moist.  Neck: Neck supple. No thyromegaly present.  Cardiovascular: Normal rate and regular rhythm.   Pulmonary/Chest: Breath sounds normal. No respiratory distress. She has no wheezes.  Abdominal: Soft. Bowel sounds are normal. There is no tenderness.  Musculoskeletal: She exhibits no edema or tenderness.  Lymphadenopathy:    She has no cervical adenopathy.  Skin: No rash noted. No erythema.  Psychiatric: She has a normal mood and affect. Her behavior is normal.    BP 120/70 mmHg  Pulse 72  Temp(Src) 98.7 F (37.1 C) (Oral)  Ht 5\' 1"  (1.549 m)  Wt 132 lb 4 oz (59.988 kg)  BMI 25.00 kg/m2  SpO2 97% Wt Readings from Last 3 Encounters:  07/17/14 132 lb 4 oz (59.988 kg)  07/10/14 138 lb (62.596 kg)  05/15/14 140 lb (63.504 kg)     Lab Results  Component Value Date   WBC 15.5* 07/10/2014   HGB 15.0 07/10/2014   HCT 44.9 07/10/2014   PLT 223 07/10/2014   GLUCOSE 136* 07/10/2014   CHOL 146 03/04/2014   TRIG 85.0 03/04/2014   HDL 64.00 03/04/2014   LDLCALC 65 03/04/2014   ALT 25 07/10/2014   AST 34 07/10/2014   NA 137 07/10/2014   K 3.7 07/10/2014   CL 102 07/10/2014   CREATININE 0.91 07/10/2014   BUN 25* 07/10/2014   CO2 25 07/10/2014   TSH 1.75 05/21/2013       Assessment & Plan:   Problem List Items Addressed This Visit    Anemia, iron deficiency    Follow  cbc.       Barrett's esophagus    Followed by Dr [name].  Reflux is better.       Chronic back pain    Seeing Dr [name] for chronic back pain.       Colon cancer    CEA ordered.  Seeing GI.  Colonoscopy 07/30/10 - internal hemorrhoids.  Health care maintenance    Mammogram 06/13/13 - Birads I.  Schedule f/u mammogram.  Schedule physical.  Being followed by GI.       Hypercholesteremia    On simvastatin.  Low cholesterol diet and exercise.  Follow lipid panel and liver function tests.       Hypertension    Blood pressure doing well.  Same medication regimen.  Follow pressures.  Follow metabolic panel.       Pneumonia - Primary    Diagnosed with pneumonia recently.  CXR 06/13/14 - mild improvement of LUL infiltrate.  Residual atelectasis and/or mild infiltrate remains.  Recommended another f/u cxr in 3-4 weeks.  She plans on f/u cxr today.  No increased cough and congestion.       Relevant Orders   DG Chest 2 View (Completed)     I spent 25 minutes with the patient and more than 50% of the time was spent in consultation regarding the above.     [name], MD

## 2014-07-27 ENCOUNTER — Encounter: Payer: Self-pay | Admitting: Internal Medicine

## 2014-07-27 NOTE — Assessment & Plan Note (Signed)
Blood pressure doing well.  Same medication regimen.  Follow pressures.  Follow metabolic panel.   

## 2014-07-27 NOTE — Assessment & Plan Note (Signed)
Follow cbc.  

## 2014-07-27 NOTE — Assessment & Plan Note (Addendum)
CEA ordered.  Seeing GI.  Colonoscopy 07/30/10 - internal hemorrhoids.

## 2014-07-27 NOTE — Assessment & Plan Note (Signed)
Mammogram 06/13/13 - Birads I.  Schedule f/u mammogram.  Schedule physical.  Being followed by GI.

## 2014-07-27 NOTE — Assessment & Plan Note (Signed)
On simvastatin.  Low cholesterol diet and exercise.  Follow lipid panel and liver function tests.   

## 2014-07-27 NOTE — Assessment & Plan Note (Signed)
Diagnosed with pneumonia recently.  CXR 06/13/14 - mild improvement of LUL infiltrate.  Residual atelectasis and/or mild infiltrate remains.  Recommended another f/u cxr in 3-4 weeks.  She plans on f/u cxr today.  No increased cough and congestion.

## 2014-07-27 NOTE — Assessment & Plan Note (Signed)
Followed by Dr Tiffany Kocher.  Reflux is better.

## 2014-07-27 NOTE — Assessment & Plan Note (Signed)
Seeing Dr Sharlet Salina for chronic back pain.

## 2014-07-31 ENCOUNTER — Other Ambulatory Visit (INDEPENDENT_AMBULATORY_CARE_PROVIDER_SITE_OTHER): Payer: Commercial Managed Care - HMO

## 2014-07-31 DIAGNOSIS — I10 Essential (primary) hypertension: Secondary | ICD-10-CM

## 2014-07-31 DIAGNOSIS — M8580 Other specified disorders of bone density and structure, unspecified site: Secondary | ICD-10-CM

## 2014-07-31 DIAGNOSIS — E7800 Pure hypercholesterolemia, unspecified: Secondary | ICD-10-CM

## 2014-07-31 DIAGNOSIS — D509 Iron deficiency anemia, unspecified: Secondary | ICD-10-CM | POA: Diagnosis not present

## 2014-07-31 DIAGNOSIS — C189 Malignant neoplasm of colon, unspecified: Secondary | ICD-10-CM | POA: Diagnosis not present

## 2014-07-31 LAB — CBC WITH DIFFERENTIAL/PLATELET
BASOS PCT: 0.6 % (ref 0.0–3.0)
Basophils Absolute: 0 10*3/uL (ref 0.0–0.1)
Eosinophils Absolute: 0.2 10*3/uL (ref 0.0–0.7)
Eosinophils Relative: 2.9 % (ref 0.0–5.0)
HEMATOCRIT: 43.3 % (ref 36.0–46.0)
HEMOGLOBIN: 14.7 g/dL (ref 12.0–15.0)
LYMPHS PCT: 34.4 % (ref 12.0–46.0)
Lymphs Abs: 2.6 10*3/uL (ref 0.7–4.0)
MCHC: 33.9 g/dL (ref 30.0–36.0)
MCV: 91 fl (ref 78.0–100.0)
Monocytes Absolute: 0.6 10*3/uL (ref 0.1–1.0)
Monocytes Relative: 7.6 % (ref 3.0–12.0)
NEUTROS ABS: 4.1 10*3/uL (ref 1.4–7.7)
Neutrophils Relative %: 54.5 % (ref 43.0–77.0)
Platelets: 248 10*3/uL (ref 150.0–400.0)
RBC: 4.76 Mil/uL (ref 3.87–5.11)
RDW: 14 % (ref 11.5–15.5)
WBC: 7.5 10*3/uL (ref 4.0–10.5)

## 2014-07-31 LAB — HEPATIC FUNCTION PANEL
ALK PHOS: 55 U/L (ref 39–117)
ALT: 18 U/L (ref 0–35)
AST: 22 U/L (ref 0–37)
Albumin: 4.2 g/dL (ref 3.5–5.2)
BILIRUBIN DIRECT: 0.3 mg/dL (ref 0.0–0.3)
BILIRUBIN TOTAL: 1.5 mg/dL — AB (ref 0.2–1.2)
Total Protein: 6.8 g/dL (ref 6.0–8.3)

## 2014-07-31 LAB — BASIC METABOLIC PANEL
BUN: 30 mg/dL — AB (ref 6–23)
CALCIUM: 9.9 mg/dL (ref 8.4–10.5)
CO2: 29 mEq/L (ref 19–32)
CREATININE: 1.02 mg/dL (ref 0.40–1.20)
Chloride: 100 mEq/L (ref 96–112)
GFR: 54.94 mL/min — AB (ref >60.00)
GLUCOSE: 95 mg/dL (ref 70–99)
Potassium: 4.1 mEq/L (ref 3.5–5.1)
Sodium: 136 mEq/L (ref 135–145)

## 2014-07-31 LAB — LIPID PANEL
CHOL/HDL RATIO: 2
Cholesterol: 163 mg/dL (ref 0–200)
HDL: 70.2 mg/dL (ref >39.00)
LDL Cholesterol: 71 mg/dL (ref 0–99)
NonHDL: 92.8
TRIGLYCERIDES: 107 mg/dL (ref 0.0–149.0)
VLDL: 21.4 mg/dL (ref 0.0–40.0)

## 2014-07-31 LAB — TSH: TSH: 3.75 u[IU]/mL (ref 0.35–4.50)

## 2014-07-31 LAB — VITAMIN D 25 HYDROXY (VIT D DEFICIENCY, FRACTURES): VITD: 50.02 ng/mL (ref 30.00–100.00)

## 2014-07-31 LAB — FERRITIN: Ferritin: 572.5 ng/mL — ABNORMAL HIGH (ref 10.0–291.0)

## 2014-08-01 ENCOUNTER — Other Ambulatory Visit: Payer: Self-pay | Admitting: Internal Medicine

## 2014-08-01 LAB — CEA: CEA: 1.3 ng/mL (ref 0.0–5.0)

## 2014-08-01 NOTE — Progress Notes (Signed)
Order placed for f/u labs.  

## 2014-08-03 ENCOUNTER — Other Ambulatory Visit: Payer: Self-pay | Admitting: Internal Medicine

## 2014-08-03 DIAGNOSIS — R9389 Abnormal findings on diagnostic imaging of other specified body structures: Secondary | ICD-10-CM

## 2014-08-03 NOTE — Progress Notes (Signed)
Abnormal cxr.  Dr Faith Rogue reviewed.  Recommended f/u chest CT with contrast.

## 2014-08-12 ENCOUNTER — Other Ambulatory Visit: Payer: Self-pay | Admitting: Internal Medicine

## 2014-08-14 ENCOUNTER — Ambulatory Visit
Admission: RE | Admit: 2014-08-14 | Discharge: 2014-08-14 | Disposition: A | Discharge status: Home / Self Care | Payer: Commercial Managed Care - HMO | Source: Ambulatory Visit | Attending: Internal Medicine | Admitting: Internal Medicine

## 2014-08-14 DIAGNOSIS — R9389 Abnormal findings on diagnostic imaging of other specified body structures: Secondary | ICD-10-CM

## 2014-08-14 DIAGNOSIS — D71 Functional disorders of polymorphonuclear neutrophils: Secondary | ICD-10-CM | POA: Insufficient documentation

## 2014-08-14 DIAGNOSIS — R938 Abnormal findings on diagnostic imaging of other specified body structures: Secondary | ICD-10-CM | POA: Insufficient documentation

## 2014-08-14 DIAGNOSIS — J181 Lobar pneumonia, unspecified organism: Secondary | ICD-10-CM | POA: Diagnosis not present

## 2014-08-14 DIAGNOSIS — K449 Diaphragmatic hernia without obstruction or gangrene: Secondary | ICD-10-CM | POA: Diagnosis not present

## 2014-08-14 DIAGNOSIS — C189 Malignant neoplasm of colon, unspecified: Secondary | ICD-10-CM | POA: Diagnosis not present

## 2014-08-14 MED ORDER — IOHEXOL 300 MG/ML  SOLN
75.0000 mL | Freq: Once | INTRAMUSCULAR | Status: AC | PRN
  Administered 2014-08-14: 75 mL via INTRAVENOUS

## 2014-08-21 ENCOUNTER — Other Ambulatory Visit: Payer: Commercial Managed Care - HMO

## 2014-08-22 ENCOUNTER — Other Ambulatory Visit (INDEPENDENT_AMBULATORY_CARE_PROVIDER_SITE_OTHER): Payer: Commercial Managed Care - HMO

## 2014-08-22 LAB — BILIRUBIN, TOTAL: Total Bilirubin: 1.7 mg/dL — ABNORMAL HIGH (ref 0.2–1.2)

## 2014-08-22 LAB — BILIRUBIN, DIRECT: BILIRUBIN DIRECT: 0.3 mg/dL (ref 0.0–0.3)

## 2014-08-24 ENCOUNTER — Other Ambulatory Visit: Payer: Self-pay | Admitting: Internal Medicine

## 2014-08-24 NOTE — Progress Notes (Signed)
Order placed for f/u liver panel.  

## 2014-09-15 DIAGNOSIS — M4726 Other spondylosis with radiculopathy, lumbar region: Secondary | ICD-10-CM | POA: Diagnosis not present

## 2014-09-15 DIAGNOSIS — M62830 Muscle spasm of back: Secondary | ICD-10-CM | POA: Diagnosis not present

## 2014-09-15 DIAGNOSIS — M7062 Trochanteric bursitis, left hip: Secondary | ICD-10-CM | POA: Diagnosis not present

## 2014-09-15 DIAGNOSIS — M5136 Other intervertebral disc degeneration, lumbar region: Secondary | ICD-10-CM | POA: Diagnosis not present

## 2014-09-15 DIAGNOSIS — M7061 Trochanteric bursitis, right hip: Secondary | ICD-10-CM | POA: Diagnosis not present

## 2014-09-19 ENCOUNTER — Other Ambulatory Visit (INDEPENDENT_AMBULATORY_CARE_PROVIDER_SITE_OTHER): Payer: Commercial Managed Care - HMO

## 2014-09-30 ENCOUNTER — Encounter: Payer: Commercial Managed Care - HMO | Admitting: Internal Medicine

## 2014-10-02 ENCOUNTER — Other Ambulatory Visit (INDEPENDENT_AMBULATORY_CARE_PROVIDER_SITE_OTHER): Payer: Commercial Managed Care - HMO

## 2014-10-02 LAB — HEPATIC FUNCTION PANEL
ALK PHOS: 49 U/L (ref 39–117)
ALT: 15 U/L (ref 0–35)
AST: 18 U/L (ref 0–37)
Albumin: 4.2 g/dL (ref 3.5–5.2)
BILIRUBIN DIRECT: 0.3 mg/dL (ref 0.0–0.3)
Total Bilirubin: 1.5 mg/dL — ABNORMAL HIGH (ref 0.2–1.2)
Total Protein: 6.5 g/dL (ref 6.0–8.3)

## 2014-10-23 ENCOUNTER — Encounter: Payer: Self-pay | Admitting: Internal Medicine

## 2014-10-23 ENCOUNTER — Ambulatory Visit (INDEPENDENT_AMBULATORY_CARE_PROVIDER_SITE_OTHER): Payer: Commercial Managed Care - HMO | Admitting: Internal Medicine

## 2014-10-23 VITALS — BP 153/68 | HR 61 | Temp 98.3°F | Ht 61.0 in | Wt 135.0 lb

## 2014-10-23 DIAGNOSIS — Z0000 Encounter for general adult medical examination without abnormal findings: Secondary | ICD-10-CM

## 2014-10-23 DIAGNOSIS — C189 Malignant neoplasm of colon, unspecified: Secondary | ICD-10-CM

## 2014-10-23 DIAGNOSIS — Z23 Encounter for immunization: Secondary | ICD-10-CM | POA: Diagnosis not present

## 2014-10-23 DIAGNOSIS — M549 Dorsalgia, unspecified: Secondary | ICD-10-CM

## 2014-10-23 DIAGNOSIS — R42 Dizziness and giddiness: Secondary | ICD-10-CM

## 2014-10-23 DIAGNOSIS — Z0001 Encounter for general adult medical examination with abnormal findings: Secondary | ICD-10-CM | POA: Diagnosis not present

## 2014-10-23 DIAGNOSIS — D509 Iron deficiency anemia, unspecified: Secondary | ICD-10-CM

## 2014-10-23 DIAGNOSIS — K2270 Barrett's esophagus without dysplasia: Secondary | ICD-10-CM | POA: Diagnosis not present

## 2014-10-23 DIAGNOSIS — E7800 Pure hypercholesterolemia, unspecified: Secondary | ICD-10-CM

## 2014-10-23 DIAGNOSIS — G8929 Other chronic pain: Secondary | ICD-10-CM

## 2014-10-23 DIAGNOSIS — I10 Essential (primary) hypertension: Secondary | ICD-10-CM

## 2014-10-23 NOTE — Progress Notes (Signed)
Patient ID: [name], female   DOB: Jan 24, 1929, 81 y.o.   MRN: [id number]   Subjective:    Patient ID: [name], female    DOB: 1928/07/30, 81 y.o.   MRN: [id number]  HPI  Patient here to follow up on her current medical issues as well as for a complete physical exam.  She recently had issues with "inner ear".  States was in bed and would flare if rolled over to her left.  Felt like she was sliding off the bed.  Worse if turn to side.  Took meclizine.  States has had this occur previously.  Was evaluated at ENT previously.  No diagnosis made.  No headache.  No cardiac symptoms with increased activity or exertion.  Eating and drinking well. Bowels stable.     Past Medical History  Diagnosis Date  . Bowel obstruction     s/p adhesion resection  . Recurrent sinus infections   . Vertigo   . Barrett's esophagus   . Hiatal hernia   . Hypercholesteremia   . Hypertension   . Chronic back pain   . Peripheral neuropathy   . Colon cancer 1988 and 1989    adenocarcinoma, s/p resection x  2 and chemo   Past Surgical History  Procedure Laterality Date  . Colon resection      x2.  s/p colon cancer  . Adhesions resected      bowel obstruction  . Cholecystectomy    . Abdominal hysterectomy  1982  . Appendectomy    . Excisional hemorrhoidectomy      with tubal ligation  . Breast biopsy     Family History  Problem Relation Age of Onset  . Breast cancer Mother   . Stroke Father   . Hypertension Father   . Diabetes Father   . Asthma Mother   . Ovarian cancer Sister     x2  . Prostate cancer Brother   . Hypertension Brother     x3  . Hypercholesterolemia Brother     x3  . Diabetes Brother    Social History   Social History  . Marital Status: Married    Spouse Name: N/A  . Number of Children: 4  . Years of Education: 12th grade   Occupational History  . homemaker    Social History Main Topics  . Smoking status: Never Smoker   . Smokeless tobacco: Never Used  .  Alcohol Use: No  . Drug Use: No  . Sexual Activity: No   Other Topics Concern  . None   Social History Narrative    Outpatient Encounter Prescriptions as of 10/23/2014  Medication Sig  . Calcium Carbonate-Vitamin D (CALCIUM 600 + D PO) Take 600 Units by mouth daily.   . Cholecalciferol (VITAMIN D3) 2000 UNITS TABS Take 2,000 Units by mouth daily.  Marland Kitchen COLACE 100 MG capsule Take 1 capsule (100 mg total) by mouth 2 (two) times daily.  Marland Kitchen estradiol (ESTRACE) 2 MG tablet TAKE 1 TABLET EVERY DAY  . fexofenadine (ALLEGRA) 60 MG tablet TAKE 1 TABLET EVERY DAY  . lactase (LACTAID) 3000 UNITS tablet Take 1 tablet by mouth as needed.  . lidocaine (LIDODERM) 5 % Place 1 patch onto the skin as needed. Remove & Discard patch within 12 hours or as directed by MD  . lidocaine (XYLOCAINE) 4 % external solution Apply topically 2 (two) times daily as needed.  Marland Kitchen lisinopril (PRINIVIL,ZESTRIL) 20 MG tablet Take 1 tablet (20 mg total) by  mouth daily.  . meclizine (ANTIVERT) 25 MG tablet Take 25 mg by mouth 3 (three) times daily as needed.  . meloxicam (MOBIC) 7.5 MG tablet Take 7.5 mg by mouth daily.  . metoCLOPramide (REGLAN) 5 MG tablet Take 5 mg by mouth. Take one tablet three times a day  . metoprolol succinate (TOPROL-XL) 25 MG 24 hr tablet TAKE 1 TABLET BY MOUTH EVERY DAY  . oxyCODONE (OXY IR/ROXICODONE) 5 MG immediate release tablet Take 5 mg by mouth 2 (two) times daily as needed.   . pantoprazole (PROTONIX) 40 MG tablet Take 40 mg by mouth 2 (two) times daily.  . simvastatin (ZOCOR) 10 MG tablet TAKE 1 TABLET AT BEDTIME  . solifenacin (VESICARE) 5 MG tablet Take 1 tablet (5 mg total) by mouth daily.  . traMADol-acetaminophen (ULTRACET) 37.5-325 MG per tablet Take 1 tablet by mouth every 8 (eight) hours as needed.  . [DISCONTINUED] docusate sodium (COLACE) 100 MG capsule Take 2 capsules (200 mg total) by mouth 2 (two) times daily.  . fish oil-omega-3 fatty acids 1000 MG capsule Take 2 g by mouth daily.    . [DISCONTINUED] hydrocortisone-pramoxine (PROCTOFOAM-HC) rectal foam Place 1 applicator rectally 3 (three) times daily. Use topically not inserted into anal canal. (Patient not taking: Reported on 10/23/2014)  . [DISCONTINUED] ondansetron (ZOFRAN) 4 MG tablet Take 1 tablet (4 mg total) by mouth every 8 (eight) hours as needed for nausea or vomiting. (Patient not taking: Reported on 10/23/2014)  . [DISCONTINUED] Witch Hazel (TUCKS) 50 % PADS Apply 1 application topically every 4 (four) hours. (Patient not taking: Reported on 10/23/2014)   No facility-administered encounter medications on file as of 10/23/2014.    Review of Systems  Constitutional: Negative for appetite change and unexpected weight change.  HENT: Negative for congestion and sinus pressure.   Eyes: Negative for pain and visual disturbance.  Respiratory: Negative for cough, chest tightness and shortness of breath.   Cardiovascular: Negative for chest pain, palpitations and leg swelling.  Gastrointestinal: Negative for nausea, vomiting, abdominal pain and diarrhea.  Genitourinary: Negative for dysuria and difficulty urinating.  Musculoskeletal: Positive for back pain (sees Dr Sharlet Salina.  stable. ). Negative for joint swelling.  Skin: Negative for color change and rash.  Neurological: Negative for dizziness, light-headedness and headaches.  Hematological: Negative for adenopathy. Does not bruise/bleed easily.  Psychiatric/Behavioral: Negative for dysphoric mood and agitation.       Objective:    Physical Exam  Constitutional: She is oriented to person, place, and time. She appears well-developed and well-nourished. No distress.  HENT:  Nose: Nose normal.  Mouth/Throat: Oropharynx is clear and moist.  Eyes: Right eye exhibits no discharge. Left eye exhibits no discharge. No scleral icterus.  Neck: Neck supple. No thyromegaly present.  Cardiovascular: Normal rate and regular rhythm.   Pulmonary/Chest: Breath sounds normal. No  accessory muscle usage. No tachypnea. No respiratory distress. She has no decreased breath sounds. She has no wheezes. She has no rhonchi. Right breast exhibits no inverted nipple, no mass, no nipple discharge and no tenderness (no axillary adenopathy). Left breast exhibits no inverted nipple, no mass, no nipple discharge and no tenderness (no axilarry adenopathy).  Abdominal: Soft. Bowel sounds are normal. There is no tenderness.  Musculoskeletal: She exhibits no edema or tenderness.  Lymphadenopathy:    She has no cervical adenopathy.  Neurological: She is alert and oriented to person, place, and time.  Skin: Skin is warm. No rash noted. No erythema.  Psychiatric: She has a normal  mood and affect. Her behavior is normal.    BP 153/68 mmHg  Pulse 61  Temp(Src) 98.3 F (36.8 C) (Oral)  Ht 5\' 1"  (1.549 m)  Wt 135 lb (61.236 kg)  BMI 25.52 kg/m2  SpO2 99% Wt Readings from Last 3 Encounters:  10/23/14 135 lb (61.236 kg)  07/17/14 132 lb 4 oz (59.988 kg)  07/10/14 138 lb (62.596 kg)     Lab Results  Component Value Date   WBC 7.5 07/31/2014   HGB 14.7 07/31/2014   HCT 43.3 07/31/2014   PLT 248.0 07/31/2014   GLUCOSE 95 07/31/2014   CHOL 163 07/31/2014   TRIG 107.0 07/31/2014   HDL 70.20 07/31/2014   LDLCALC 71 07/31/2014   ALT 15 10/02/2014   AST 18 10/02/2014   NA 136 07/31/2014   K 4.1 07/31/2014   CL 100 07/31/2014   CREATININE 1.02 07/31/2014   BUN 30* 07/31/2014   CO2 29 07/31/2014   TSH 3.75 07/31/2014       Assessment & Plan:   Problem List Items Addressed This Visit    Anemia, iron deficiency    Follow cbc.        Barrett's esophagus    Reflux is better.  Followed by Dr Tiffany Kocher.  Continue on protonix.        Chronic back pain    Followed by Dr Sharlet Salina.  Stable.       Colon cancer    Colonoscopy 07/30/10 - internal hemorrhoids.  CEA 1.3 07/21/14.        Dizziness    Previous dizziness as outlined.  Has improved/resolved now.  Discussed further w/up.   She wants to monitor.  Will follow.  Evaluate if reoccurrence.  Does appear to be c/w inner ear.  Has seen ENT previously.  Per pt, no diagnosis made.        Health care maintenance    Scheduled for mammogram.  Colonoscopy 07/20/10.  Followed by GI.  Pt states was told did not need another colonoscopy.  Physical today (10/23/14).        Hyperbilirubinemia    Persistent elevation.  May be normal variant.  Discussed f/u abdominal ultrasound.  She wants to hold on ultrasound.  Due to see Dr Tiffany Kocher soon.  Wants to discuss with him.        Hypercholesteremia    On simvastatin.  Low cholesterol diet and exercise.  Follow lipid panel and liver function tests.        Relevant Orders   Hepatic function panel   Lipid panel   Hypertension    Blood pressure rechecked by me 144/78.  Will continue same medication regimen.  Follow pressures.  Follow metabolic panel.        Relevant Orders   Basic metabolic panel    Other Visit Diagnoses    Encounter for immunization    -  Primary    Routine general medical examination at a health care facility            [name], MD

## 2014-10-23 NOTE — Progress Notes (Signed)
Pre-visit discussion using our clinic review tool. No additional management support is needed unless otherwise documented below in the visit note.  

## 2014-10-26 ENCOUNTER — Encounter: Payer: Self-pay | Admitting: Internal Medicine

## 2014-10-26 DIAGNOSIS — R42 Dizziness and giddiness: Secondary | ICD-10-CM | POA: Insufficient documentation

## 2014-10-26 NOTE — Assessment & Plan Note (Signed)
Persistent elevation.  May be normal variant.  Discussed f/u abdominal ultrasound.  She wants to hold on ultrasound.  Due to see Dr Tiffany Kocher soon.  Wants to discuss with him.

## 2014-10-26 NOTE — Assessment & Plan Note (Signed)
Colonoscopy 07/30/10 - internal hemorrhoids.  CEA 1.3 07/21/14.

## 2014-10-26 NOTE — Assessment & Plan Note (Signed)
Reflux is better.  Followed by Dr Tiffany Kocher.  Continue on protonix.

## 2014-10-26 NOTE — Assessment & Plan Note (Signed)
Blood pressure rechecked by me 144/78.  Will continue same medication regimen.  Follow pressures.  Follow metabolic panel.

## 2014-10-26 NOTE — Assessment & Plan Note (Signed)
Scheduled for mammogram.  Colonoscopy 07/20/10.  Followed by GI.  Pt states was told did not need another colonoscopy.  Physical today (10/23/14).

## 2014-10-26 NOTE — Assessment & Plan Note (Signed)
Previous dizziness as outlined.  Has improved/resolved now.  Discussed further w/up.  She wants to monitor.  Will follow.  Evaluate if reoccurrence.  Does appear to be c/w inner ear.  Has seen ENT previously.  Per pt, no diagnosis made.

## 2014-10-26 NOTE — Assessment & Plan Note (Signed)
On simvastatin.  Low cholesterol diet and exercise.  Follow lipid panel and liver function tests.   

## 2014-10-26 NOTE — Assessment & Plan Note (Signed)
Followed by Dr Chasnis.  Stable.   

## 2014-10-26 NOTE — Assessment & Plan Note (Signed)
Follow cbc.  

## 2014-10-28 ENCOUNTER — Other Ambulatory Visit: Payer: Self-pay | Admitting: Internal Medicine

## 2014-10-28 ENCOUNTER — Inpatient Hospital Stay
Admission: RE | Admit: 2014-10-28 | Discharge: 2014-10-28 | Disposition: A | Discharge status: Home / Self Care | Payer: Commercial Managed Care - HMO | Source: Ambulatory Visit | Attending: Internal Medicine | Admitting: Internal Medicine

## 2014-10-28 DIAGNOSIS — Z1239 Encounter for other screening for malignant neoplasm of breast: Secondary | ICD-10-CM

## 2014-10-28 DIAGNOSIS — M7061 Trochanteric bursitis, right hip: Secondary | ICD-10-CM | POA: Diagnosis not present

## 2014-10-28 DIAGNOSIS — Z1231 Encounter for screening mammogram for malignant neoplasm of breast: Secondary | ICD-10-CM | POA: Diagnosis not present

## 2014-10-28 DIAGNOSIS — M62830 Muscle spasm of back: Secondary | ICD-10-CM | POA: Diagnosis not present

## 2014-10-28 DIAGNOSIS — M5136 Other intervertebral disc degeneration, lumbar region: Secondary | ICD-10-CM | POA: Diagnosis not present

## 2014-10-28 DIAGNOSIS — M5416 Radiculopathy, lumbar region: Secondary | ICD-10-CM | POA: Diagnosis not present

## 2014-10-28 HISTORY — DX: Personal history of antineoplastic chemotherapy: Z92.21

## 2014-11-18 ENCOUNTER — Telehealth: Payer: Self-pay | Admitting: *Deleted

## 2014-11-18 NOTE — Telephone Encounter (Signed)
Patient has requested her follow up on Oct 20,2016. She requested to be worked in between after 3pm. She also requested Oct 25,2016 after 3pm. Patient stated that she lives far from the office and do not drive anymore. She has other appts in this town on these dates and woud like to get her appt  In the same day. Please advise where to place on schedule.

## 2014-11-18 NOTE — Telephone Encounter (Signed)
You can schedule an appt for 4:00 on 12/09/14.  Thanks

## 2014-12-08 ENCOUNTER — Other Ambulatory Visit: Payer: Self-pay | Admitting: Internal Medicine

## 2014-12-09 ENCOUNTER — Ambulatory Visit: Payer: Self-pay | Admitting: Internal Medicine

## 2014-12-15 ENCOUNTER — Telehealth: Payer: Self-pay | Admitting: Internal Medicine

## 2014-12-15 MED ORDER — LISINOPRIL-HYDROCHLOROTHIAZIDE 10-12.5 MG PO TABS: ORAL_TABLET | ORAL | Status: DC

## 2014-12-15 MED ORDER — METOPROLOL SUCCINATE ER 25 MG PO TB24: 25.0000 mg | ORAL_TABLET | Freq: Every day | ORAL | Status: DC

## 2014-12-15 NOTE — Telephone Encounter (Signed)
Pt called needing refills on metoprolol succinate (TOPROL-XL) 25 MG 24 hr tablet  And lisinopril-hydrochlorothiazide (PRINZIDE,ZESTORETIC) 10-12.5 MG tablet .Marland Kitchen Sent to [facility].Marland Kitchenplease advise pt if you have any questions.Marland Kitchen

## 2014-12-15 NOTE — Telephone Encounter (Signed)
Refilled per her request.

## 2014-12-30 DIAGNOSIS — M5136 Other intervertebral disc degeneration, lumbar region: Secondary | ICD-10-CM | POA: Diagnosis not present

## 2014-12-30 DIAGNOSIS — M4806 Spinal stenosis, lumbar region: Secondary | ICD-10-CM | POA: Diagnosis not present

## 2014-12-30 DIAGNOSIS — M5416 Radiculopathy, lumbar region: Secondary | ICD-10-CM | POA: Diagnosis not present

## 2014-12-31 ENCOUNTER — Other Ambulatory Visit: Payer: Self-pay | Admitting: Physical Medicine and Rehabilitation

## 2014-12-31 DIAGNOSIS — M5417 Radiculopathy, lumbosacral region: Secondary | ICD-10-CM

## 2015-01-09 DIAGNOSIS — R05 Cough: Secondary | ICD-10-CM | POA: Diagnosis not present

## 2015-01-13 ENCOUNTER — Ambulatory Visit
Admission: RE | Admit: 2015-01-13 | Discharge: 2015-01-13 | Disposition: A | Discharge status: Home / Self Care | Payer: Commercial Managed Care - HMO | Source: Ambulatory Visit | Attending: Internal Medicine | Admitting: Internal Medicine

## 2015-01-13 ENCOUNTER — Encounter: Payer: Self-pay | Admitting: Internal Medicine

## 2015-01-13 ENCOUNTER — Ambulatory Visit (INDEPENDENT_AMBULATORY_CARE_PROVIDER_SITE_OTHER): Payer: Commercial Managed Care - HMO | Admitting: Internal Medicine

## 2015-01-13 VITALS — BP 157/65 | HR 59 | Temp 98.1°F | Resp 17 | Ht 61.0 in | Wt 143.5 lb

## 2015-01-13 DIAGNOSIS — R05 Cough: Secondary | ICD-10-CM

## 2015-01-13 DIAGNOSIS — R059 Cough, unspecified: Secondary | ICD-10-CM

## 2015-01-13 DIAGNOSIS — I10 Essential (primary) hypertension: Secondary | ICD-10-CM | POA: Diagnosis not present

## 2015-01-13 DIAGNOSIS — J069 Acute upper respiratory infection, unspecified: Secondary | ICD-10-CM

## 2015-01-13 DIAGNOSIS — D509 Iron deficiency anemia, unspecified: Secondary | ICD-10-CM

## 2015-01-13 DIAGNOSIS — E7800 Pure hypercholesterolemia, unspecified: Secondary | ICD-10-CM | POA: Diagnosis not present

## 2015-01-13 DIAGNOSIS — K22719 Barrett's esophagus with dysplasia, unspecified: Secondary | ICD-10-CM

## 2015-01-13 DIAGNOSIS — M549 Dorsalgia, unspecified: Secondary | ICD-10-CM

## 2015-01-13 DIAGNOSIS — G8929 Other chronic pain: Secondary | ICD-10-CM

## 2015-01-13 MED ORDER — LOSARTAN POTASSIUM-HCTZ 50-12.5 MG PO TABS: 1.0000 | ORAL_TABLET | Freq: Every day | ORAL | Status: DC

## 2015-01-13 MED ORDER — AZITHROMYCIN 250 MG PO TABS: ORAL_TABLET | ORAL | Status: DC

## 2015-01-13 MED ORDER — FLUTICASONE PROPIONATE HFA 110 MCG/ACT IN AERO: 2.0000 | INHALATION_SPRAY | Freq: Two times a day (BID) | RESPIRATORY_TRACT | Status: DC

## 2015-01-13 NOTE — Progress Notes (Signed)
Patient ID: [name], female   DOB: October 11, 1928, 81 y.o.   MRN: [id number]   Subjective:    Patient ID: [name], female    DOB: 1928/07/24, 81 y.o.   MRN: [id number]  HPI  Patient with past history of hypercholesterolemia, Barrett's esophagitis, hypertension and chronic back pain.  She comes in today for a scheduled follow up.  Has had increased cough and congestion.  Was seen at [facility] last week.  Placed on prednisone and augmentin.  Still with increased cough.  Increased sinus congestion and yellow nasal congestion.  Chest congestion.  Not moving.  No wheezing.  Has a good appetite.  No vomiting.  No diarrhea.  Feels fatigue.     Past Medical History  Diagnosis Date  . Bowel obstruction (HCC)     s/p adhesion resection  . Recurrent sinus infections   . Vertigo   . Barrett's esophagus   . Hiatal hernia   . Hypercholesteremia   . Hypertension   . Chronic back pain   . Peripheral neuropathy ([place])   . Colon cancer ([place]) 1988 and 1989    adenocarcinoma, s/p resection x  2 and chemo  . S/P chemotherapy, time since greater than 12 weeks     colon cancer   Past Surgical History  Procedure Laterality Date  . Colon resection      x2.  s/p colon cancer  . Adhesions resected      bowel obstruction  . Cholecystectomy    . Abdominal hysterectomy  1982  . Appendectomy    . Excisional hemorrhoidectomy      with tubal ligation  . Breast biopsy Left     negative 06/14/1985   Family History  Problem Relation Age of Onset  . Breast cancer Mother   . Stroke Father   . Hypertension Father   . Diabetes Father   . Asthma Mother   . Ovarian cancer Sister     x2  . Prostate cancer Brother   . Hypertension Brother     x3  . Hypercholesterolemia Brother     x3  . Diabetes Brother    Social History   Social History  . Marital Status: Married    Spouse Name: N/A  . Number of Children: 4  . Years of Education: 12th grade   Occupational History  . homemaker     Social History Main Topics  . Smoking status: Never Smoker   . Smokeless tobacco: Never Used  . Alcohol Use: No  . Drug Use: No  . Sexual Activity: No   Other Topics Concern  . None   Social History Narrative    Outpatient Encounter Prescriptions as of 01/13/2015  Medication Sig  . amoxicillin-clavulanate (AUGMENTIN) 875-125 MG tablet TAKE 1 TABLET BY MOUTH 2 (TWO) TIMES A DAY FOR 5 DAYS.  Marland Kitchen benzonatate (TESSALON) 100 MG capsule TAKE 1 CAPSULE BY MOUTH THREE TIMES DAILY AS NEEDED, DO NOT BREAK, CHEW, DISSOLVE,CUT OR CRUSH  . Calcium Carbonate-Vitamin D (CALCIUM 600 + D PO) Take 600 Units by mouth daily.   . Cholecalciferol (VITAMIN D3) 2000 UNITS TABS Take 2,000 Units by mouth daily.  Marland Kitchen COLACE 100 MG capsule Take 1 capsule (100 mg total) by mouth 2 (two) times daily.  Marland Kitchen estradiol (ESTRACE) 2 MG tablet TAKE 1 TABLET EVERY DAY  . fexofenadine (ALLEGRA) 60 MG tablet TAKE 1 TABLET EVERY DAY  . fish oil-omega-3 fatty acids 1000 MG capsule Take 2 g by mouth daily.  Marland Kitchen  lactase (LACTAID) 3000 UNITS tablet Take 1 tablet by mouth as needed.  . lidocaine (LIDODERM) 5 % Place 1 patch onto the skin as needed. Remove & Discard patch within 12 hours or as directed by MD  . lidocaine (XYLOCAINE) 4 % external solution Apply topically 2 (two) times daily as needed.  . meclizine (ANTIVERT) 25 MG tablet Take 25 mg by mouth 3 (three) times daily as needed.  . meloxicam (MOBIC) 7.5 MG tablet Take 7.5 mg by mouth daily.  . metoCLOPramide (REGLAN) 5 MG tablet Take 5 mg by mouth. Take one tablet three times a day  . metoprolol succinate (TOPROL-XL) 25 MG 24 hr tablet Take 1 tablet (25 mg total) by mouth daily.  Marland Kitchen oxyCODONE (OXY IR/ROXICODONE) 5 MG immediate release tablet Take 5 mg by mouth 2 (two) times daily as needed.   . pantoprazole (PROTONIX) 40 MG tablet Take 40 mg by mouth 2 (two) times daily.  . simvastatin (ZOCOR) 10 MG tablet TAKE 1 TABLET AT BEDTIME  . solifenacin (VESICARE) 5 MG tablet Take 1  tablet (5 mg total) by mouth daily.  . traMADol-acetaminophen (ULTRACET) 37.5-325 MG per tablet Take 1 tablet by mouth every 8 (eight) hours as needed.  . VENTOLIN HFA 108 (90 BASE) MCG/ACT inhaler 1-2 INHALATIONS EVERY 4-6 HOURS AS NEEDED FOR WHEEZING. DISPENSE SPACER AS NEEDED.  . [DISCONTINUED] lisinopril (PRINIVIL,ZESTRIL) 20 MG tablet Take 1 tablet (20 mg total) by mouth daily.  . [DISCONTINUED] lisinopril-hydrochlorothiazide (PRINZIDE,ZESTORETIC) 10-12.5 MG tablet TAKE 1 TABLET ONE TIME DAILY  . losartan-hydrochlorothiazide (HYZAAR) 50-12.5 MG tablet Take 1 tablet by mouth daily.  . [DISCONTINUED] azithromycin (ZITHROMAX) 250 MG tablet Take 2 tablets x 1 day and then one tablet per day for four more days.  . [DISCONTINUED] fluticasone (FLOVENT HFA) 110 MCG/ACT inhaler Inhale 2 puffs into the lungs 2 (two) times daily.   No facility-administered encounter medications on file as of 01/13/2015.    Review of Systems  Constitutional: Positive for fatigue. Negative for appetite change and unexpected weight change.  HENT: Positive for congestion, postnasal drip and sinus pressure.   Eyes: Negative for pain and discharge.  Respiratory: Positive for cough. Negative for chest tightness and shortness of breath.   Cardiovascular: Negative for chest pain, palpitations and leg swelling.  Gastrointestinal: Negative for nausea, vomiting, abdominal pain and diarrhea.  Genitourinary: Negative for dysuria and difficulty urinating.  Skin: Negative for color change and rash.  Neurological: Negative for dizziness, light-headedness and headaches.  Psychiatric/Behavioral: Negative for dysphoric mood and agitation.       Objective:     Blood pressure rechecked by me:  138-140/78-82  Physical Exam  Constitutional: She appears well-developed and well-nourished. No distress.  HENT:  Mouth/Throat: Oropharynx is clear and moist.  Slightly erythematous turbinates.  Minimal tenderness to palpation over the  sinuses.    Eyes: Conjunctivae are normal. Right eye exhibits no discharge. Left eye exhibits no discharge.  Neck: Neck supple.  Cardiovascular: Normal rate and regular rhythm.   Pulmonary/Chest: Breath sounds normal. No respiratory distress. She has no wheezes.  Abdominal: Soft. Bowel sounds are normal. There is no tenderness.  Musculoskeletal: She exhibits no edema or tenderness.  Lymphadenopathy:    She has no cervical adenopathy.  Skin: No rash noted. No erythema.  Psychiatric: She has a normal mood and affect. Her behavior is normal.    BP 157/65 mmHg  Pulse 59  Temp(Src) 98.1 F (36.7 C) (Oral)  Resp 17  Ht 5\' 1"  (1.549 m)  Wt 143 lb 8 oz (65.091 kg)  BMI 27.13 kg/m2  SpO2 98% Wt Readings from Last 3 Encounters:  01/13/15 143 lb 8 oz (65.091 kg)  10/23/14 135 lb (61.236 kg)  07/17/14 132 lb 4 oz (59.988 kg)     Lab Results  Component Value Date   WBC 7.5 07/31/2014   HGB 14.7 07/31/2014   HCT 43.3 07/31/2014   PLT 248.0 07/31/2014   GLUCOSE 95 07/31/2014   CHOL 163 07/31/2014   TRIG 107.0 07/31/2014   HDL 70.20 07/31/2014   LDLCALC 71 07/31/2014   ALT 15 10/02/2014   AST 18 10/02/2014   NA 136 07/31/2014   K 4.1 07/31/2014   CL 100 07/31/2014   CREATININE 1.02 07/31/2014   BUN 30* 07/31/2014   CO2 29 07/31/2014   TSH 3.75 07/31/2014    Mm Screening Breast Tomo Bilateral  10/28/2014  CLINICAL DATA:  Screening. EXAM: DIGITAL SCREENING BILATERAL MAMMOGRAM WITH 3D TOMO WITH CAD COMPARISON:  Previous exam(s). ACR Breast Density Category c: The breast tissue is heterogeneously dense, which may obscure small masses. FINDINGS: There are no findings suspicious for malignancy. Images were processed with CAD. IMPRESSION: No mammographic evidence of malignancy. A result letter of this screening mammogram will be mailed directly to the patient. RECOMMENDATION: Screening mammogram in one year. (Code:SM-B-01Y) BI-RADS CATEGORY  1: Negative. Electronically Signed   By:  Everlean Alstrom M.D.   On: 10/28/2014 14:42       Assessment & Plan:   Problem List Items Addressed This Visit    Anemia, iron deficiency    Follow cbc.       Barrett's esophagus    Sees Dr Tiffany Kocher.  Reflux has been better.  On protonix.        Chronic back pain    Seeing Dr Sharlet Salina.        Hyperbilirubinemia    Elevated bilirubin.  Direct bili wnl.  Recheck liver panel with next labs.  Referred to GI previously.       Hypercholesteremia    On simvastatin.  Low cholesterol diet and exercise.  Follow lipid panel and liver function tests.        Relevant Medications   losartan-hydrochlorothiazide (HYZAAR) 50-12.5 MG tablet   Hypertension    Blood pressure as outlined.  Given persistent cough, will change lisinopril to losartan/hctz.  Follow pressures.  Get her back in soon to reassess.        Relevant Medications   losartan-hydrochlorothiazide (HYZAAR) 50-12.5 MG tablet   URI (upper respiratory infection)    Symptoms and exam as outlined.  Increased sinus congestion and chest congestion as outlined.  Cough.  Completing augmentin and prednisone.  Given persistent symptoms, treat with zpak.  Check cxr.  Hold on further prednisone.  flovent inhaler as directed.  Continue rescue inhaler as needed.  Saline nasal spray and nasacort nasal spry as directed.  Robitussin as directed.  Change lisinopril as outlined.  Follow.  Call with update.         Other Visit Diagnoses    Cough    -  Primary    Relevant Orders    DG Chest 2 View (Completed)        [name], MD

## 2015-01-13 NOTE — Patient Instructions (Addendum)
Saline nasal spray - flush nose at least 2-3x/day  nasacort nasal spray - 2 sprays each nostril one time per day.  Do this in the evening.    Stop the lisinopril/hctz.  Start losartan/hctz - one per day.    Continue the rescue inhaler as needed.    Flovent Inhaler - 2 puffs twice a day.  Rinse mouth after use.    mucinex in the am   Delsym at night.

## 2015-01-13 NOTE — Progress Notes (Signed)
Pre-visit discussion using our clinic review tool. No additional management support is needed unless otherwise documented below in the visit note.  

## 2015-01-14 ENCOUNTER — Other Ambulatory Visit: Payer: Self-pay

## 2015-01-14 ENCOUNTER — Telehealth: Payer: Self-pay | Admitting: Internal Medicine

## 2015-01-14 ENCOUNTER — Other Ambulatory Visit: Payer: Self-pay | Admitting: *Deleted

## 2015-01-14 MED ORDER — AZITHROMYCIN 250 MG PO TABS: ORAL_TABLET | ORAL | Status: DC

## 2015-01-14 MED ORDER — FLUTICASONE PROPIONATE HFA 110 MCG/ACT IN AERO: 2.0000 | INHALATION_SPRAY | Freq: Two times a day (BID) | RESPIRATORY_TRACT | Status: DC

## 2015-01-14 NOTE — Telephone Encounter (Signed)
Call in 

## 2015-01-14 NOTE — Telephone Encounter (Signed)
Pt called about only receiving one prescription. Pt states it should have been two more azithromycin (ZITHROMAX) 250 MG tablet and fluticasone (FLOVENT HFA) 110 MCG/ACT inhaler. Pharmacy is [id number] - [place], [place] [address]. Thank You!

## 2015-01-18 ENCOUNTER — Encounter: Payer: Self-pay | Admitting: Internal Medicine

## 2015-01-18 DIAGNOSIS — J069 Acute upper respiratory infection, unspecified: Secondary | ICD-10-CM | POA: Insufficient documentation

## 2015-01-18 NOTE — Assessment & Plan Note (Signed)
On simvastatin.  Low cholesterol diet and exercise.  Follow lipid panel and liver function tests.   

## 2015-01-18 NOTE — Assessment & Plan Note (Signed)
Sees Dr Tiffany Kocher.  Reflux has been better.  On protonix.

## 2015-01-18 NOTE — Assessment & Plan Note (Addendum)
Elevated bilirubin.  Direct bili wnl.  Recheck liver panel with next labs.  Referred to GI previously.

## 2015-01-18 NOTE — Assessment & Plan Note (Signed)
Follow cbc.  

## 2015-01-18 NOTE — Assessment & Plan Note (Signed)
Blood pressure as outlined.  Given persistent cough, will change lisinopril to losartan/hctz.  Follow pressures.  Get her back in soon to reassess.

## 2015-01-18 NOTE — Assessment & Plan Note (Addendum)
Symptoms and exam as outlined.  Increased sinus congestion and chest congestion as outlined.  Cough.  Completing augmentin and prednisone.  Given persistent symptoms, treat with zpak.  Check cxr.  Hold on further prednisone.  flovent inhaler as directed.  Continue rescue inhaler as needed.  Saline nasal spray and nasacort nasal spry as directed.  Robitussin as directed.  Change lisinopril as outlined.  Follow.  Call with update.

## 2015-01-18 NOTE — Assessment & Plan Note (Signed)
Seeing Dr Chasnis.   

## 2015-01-20 ENCOUNTER — Ambulatory Visit
Admission: RE | Admit: 2015-01-20 | Discharge: 2015-01-20 | Disposition: A | Discharge status: Home / Self Care | Payer: Commercial Managed Care - HMO | Source: Ambulatory Visit | Attending: Physical Medicine and Rehabilitation | Admitting: Physical Medicine and Rehabilitation

## 2015-01-20 DIAGNOSIS — M545 Low back pain: Secondary | ICD-10-CM | POA: Diagnosis not present

## 2015-01-20 DIAGNOSIS — M47816 Spondylosis without myelopathy or radiculopathy, lumbar region: Secondary | ICD-10-CM | POA: Diagnosis not present

## 2015-01-20 DIAGNOSIS — M5127 Other intervertebral disc displacement, lumbosacral region: Secondary | ICD-10-CM | POA: Insufficient documentation

## 2015-01-20 DIAGNOSIS — M4806 Spinal stenosis, lumbar region: Secondary | ICD-10-CM | POA: Diagnosis not present

## 2015-01-20 DIAGNOSIS — M79604 Pain in right leg: Secondary | ICD-10-CM | POA: Diagnosis not present

## 2015-01-20 DIAGNOSIS — M5417 Radiculopathy, lumbosacral region: Secondary | ICD-10-CM

## 2015-01-20 DIAGNOSIS — M5416 Radiculopathy, lumbar region: Secondary | ICD-10-CM | POA: Diagnosis present

## 2015-01-20 DIAGNOSIS — M4186 Other forms of scoliosis, lumbar region: Secondary | ICD-10-CM | POA: Diagnosis not present

## 2015-01-26 DIAGNOSIS — M5416 Radiculopathy, lumbar region: Secondary | ICD-10-CM | POA: Diagnosis not present

## 2015-01-26 DIAGNOSIS — M62830 Muscle spasm of back: Secondary | ICD-10-CM | POA: Diagnosis not present

## 2015-01-26 DIAGNOSIS — M4806 Spinal stenosis, lumbar region: Secondary | ICD-10-CM | POA: Diagnosis not present

## 2015-01-26 DIAGNOSIS — M5136 Other intervertebral disc degeneration, lumbar region: Secondary | ICD-10-CM | POA: Diagnosis not present

## 2015-01-27 DIAGNOSIS — M4800 Spinal stenosis, site unspecified: Secondary | ICD-10-CM | POA: Diagnosis not present

## 2015-02-05 DIAGNOSIS — D229 Melanocytic nevi, unspecified: Secondary | ICD-10-CM | POA: Diagnosis not present

## 2015-02-05 DIAGNOSIS — D692 Other nonthrombocytopenic purpura: Secondary | ICD-10-CM | POA: Diagnosis not present

## 2015-02-05 DIAGNOSIS — L821 Other seborrheic keratosis: Secondary | ICD-10-CM | POA: Diagnosis not present

## 2015-02-05 DIAGNOSIS — L739 Follicular disorder, unspecified: Secondary | ICD-10-CM | POA: Diagnosis not present

## 2015-02-05 DIAGNOSIS — Z1283 Encounter for screening for malignant neoplasm of skin: Secondary | ICD-10-CM | POA: Diagnosis not present

## 2015-02-05 DIAGNOSIS — Z85828 Personal history of other malignant neoplasm of skin: Secondary | ICD-10-CM | POA: Diagnosis not present

## 2015-02-05 DIAGNOSIS — L812 Freckles: Secondary | ICD-10-CM | POA: Diagnosis not present

## 2015-02-05 DIAGNOSIS — D1800 Hemangioma unspecified site: Secondary | ICD-10-CM | POA: Diagnosis not present

## 2015-02-17 ENCOUNTER — Encounter: Payer: Self-pay | Admitting: Internal Medicine

## 2015-02-17 ENCOUNTER — Ambulatory Visit (INDEPENDENT_AMBULATORY_CARE_PROVIDER_SITE_OTHER): Payer: Commercial Managed Care - HMO | Admitting: Internal Medicine

## 2015-02-17 VITALS — BP 110/70 | HR 57 | Temp 98.2°F | Resp 18 | Ht 61.0 in | Wt 143.5 lb

## 2015-02-17 DIAGNOSIS — E7800 Pure hypercholesterolemia, unspecified: Secondary | ICD-10-CM

## 2015-02-17 DIAGNOSIS — K22719 Barrett's esophagus with dysplasia, unspecified: Secondary | ICD-10-CM

## 2015-02-17 DIAGNOSIS — I10 Essential (primary) hypertension: Secondary | ICD-10-CM | POA: Diagnosis not present

## 2015-02-17 DIAGNOSIS — R7989 Other specified abnormal findings of blood chemistry: Secondary | ICD-10-CM

## 2015-02-17 DIAGNOSIS — C189 Malignant neoplasm of colon, unspecified: Secondary | ICD-10-CM

## 2015-02-17 DIAGNOSIS — G8929 Other chronic pain: Secondary | ICD-10-CM

## 2015-02-17 DIAGNOSIS — Z8701 Personal history of pneumonia (recurrent): Secondary | ICD-10-CM

## 2015-02-17 DIAGNOSIS — M549 Dorsalgia, unspecified: Secondary | ICD-10-CM

## 2015-02-17 MED ORDER — LOSARTAN POTASSIUM-HCTZ 50-12.5 MG PO TABS: 1.0000 | ORAL_TABLET | Freq: Every day | ORAL | Status: DC

## 2015-02-17 NOTE — Progress Notes (Signed)
Pre-visit discussion using our clinic review tool. No additional management support is needed unless otherwise documented below in the visit note.  

## 2015-02-17 NOTE — Progress Notes (Signed)
Patient ID: [name], female   DOB: May 17, 1928, 82 y.o.   MRN: [id number]   Subjective:    Patient ID: [name], female    DOB: [birth year], 82 y.o.   MRN: [id number]  HPI  Patient with past history of Barrett's, hypertension, chronic back pain and hypercholesterolemia.  She comes in today to follow up on these issues.  Last visit, we changed her lisinopril to Losartan/HCTZ.  Her cough is better.  Congestion resolved.  No significant cough.  Breathing stable.  No chest pain or tightness.  She sees Dr Markham Jordan for her acid reflux.  Under reasonable control on current regimen.  No abdominal pain or cramping.  Bowels stable.  Blood pressure varying.  Her outside readings mostly ranging 130s-150s systolic readings.  Chronic back and leg pain.  Seeing Dr Yves Dill.     Past Medical History  Diagnosis Date  . Bowel obstruction (HCC)     s/p adhesion resection  . Recurrent sinus infections   . Vertigo   . Barrett's esophagus   . Hiatal hernia   . Hypercholesteremia   . Hypertension   . Chronic back pain   . Peripheral neuropathy (HCC)   . Colon cancer (HCC) 1988 and 1989    adenocarcinoma, s/p resection x  2 and chemo  . S/P chemotherapy, time since greater than 12 weeks     colon cancer   Past Surgical History  Procedure Laterality Date  . Colon resection      x2.  s/p colon cancer  . Adhesions resected      bowel obstruction  . Cholecystectomy    . Abdominal hysterectomy  1982  . Appendectomy    . Excisional hemorrhoidectomy      with tubal ligation  . Breast biopsy Left     negative 06/14/1985   Family History  Problem Relation Age of Onset  . Breast cancer Mother   . Stroke Father   . Hypertension Father   . Diabetes Father   . Asthma Mother   . Ovarian cancer Sister     x2  . Prostate cancer Brother   . Hypertension Brother     x3  . Hypercholesterolemia Brother     x3  . Diabetes Brother    Social History   Social History  . Marital Status: Married   Spouse Name: N/A  . Number of Children: 4  . Years of Education: 12th grade   Occupational History  . homemaker    Social History Main Topics  . Smoking status: Never Smoker   . Smokeless tobacco: Never Used  . Alcohol Use: No  . Drug Use: No  . Sexual Activity: No   Other Topics Concern  . None   Social History Narrative    Outpatient Encounter Prescriptions as of 02/17/2015  Medication Sig  . Calcium Carbonate-Vitamin D (CALCIUM 600 + D PO) Take 600 Units by mouth daily.   . Cholecalciferol (VITAMIN D3) 2000 UNITS TABS Take 2,000 Units by mouth daily.  Marland Kitchen COLACE 100 MG capsule Take 1 capsule (100 mg total) by mouth 2 (two) times daily.  Marland Kitchen estradiol (ESTRACE) 2 MG tablet TAKE 1 TABLET EVERY DAY  . fexofenadine (ALLEGRA) 60 MG tablet TAKE 1 TABLET EVERY DAY  . fish oil-omega-3 fatty acids 1000 MG capsule Take 2 g by mouth daily.  . fluticasone (FLOVENT HFA) 110 MCG/ACT inhaler Inhale 2 puffs into the lungs 2 (two) times daily.  Marland Kitchen lactase (LACTAID) 3000 UNITS tablet Take  1 tablet by mouth as needed.  . lidocaine (LIDODERM) 5 % Place 1 patch onto the skin as needed. Remove & Discard patch within 12 hours or as directed by MD  . lidocaine (XYLOCAINE) 4 % external solution Apply topically 2 (two) times daily as needed.  Marland Kitchen losartan-hydrochlorothiazide (HYZAAR) 50-12.5 MG tablet Take 1 tablet by mouth daily.  . meclizine (ANTIVERT) 25 MG tablet Take 25 mg by mouth 3 (three) times daily as needed.  . meloxicam (MOBIC) 7.5 MG tablet Take 7.5 mg by mouth daily.  . metoCLOPramide (REGLAN) 5 MG tablet Take 5 mg by mouth. Take one tablet three times a day  . metoprolol succinate (TOPROL-XL) 25 MG 24 hr tablet Take 1 tablet (25 mg total) by mouth daily.  Marland Kitchen oxyCODONE (OXY IR/ROXICODONE) 5 MG immediate release tablet Take 5 mg by mouth 2 (two) times daily as needed.   . pantoprazole (PROTONIX) 40 MG tablet Take 40 mg by mouth 2 (two) times daily.  . simvastatin (ZOCOR) 10 MG tablet TAKE 1 TABLET  AT BEDTIME  . solifenacin (VESICARE) 5 MG tablet Take 1 tablet (5 mg total) by mouth daily.  . traMADol-acetaminophen (ULTRACET) 37.5-325 MG per tablet Take 1 tablet by mouth every 8 (eight) hours as needed.  . VENTOLIN HFA 108 (90 BASE) MCG/ACT inhaler 1-2 INHALATIONS EVERY 4-6 HOURS AS NEEDED FOR WHEEZING. DISPENSE SPACER AS NEEDED.  . [DISCONTINUED] losartan-hydrochlorothiazide (HYZAAR) 50-12.5 MG tablet Take 1 tablet by mouth daily.  . [DISCONTINUED] amoxicillin-clavulanate (AUGMENTIN) 875-125 MG tablet TAKE 1 TABLET BY MOUTH 2 (TWO) TIMES A DAY FOR 5 DAYS.  . [DISCONTINUED] azithromycin (ZITHROMAX) 250 MG tablet Take 2 tablets x 1 day and then one tablet per day for four more days.  . [DISCONTINUED] benzonatate (TESSALON) 100 MG capsule TAKE 1 CAPSULE BY MOUTH THREE TIMES DAILY AS NEEDED, DO NOT BREAK, CHEW, DISSOLVE,CUT OR CRUSH   No facility-administered encounter medications on file as of 02/17/2015.    Review of Systems  Constitutional: Negative for appetite change and unexpected weight change.  HENT: Negative for congestion and sinus pressure.   Respiratory: Negative for cough, chest tightness and shortness of breath.   Cardiovascular: Negative for chest pain, palpitations and leg swelling.  Gastrointestinal: Negative for nausea, vomiting, abdominal pain and diarrhea.       Reflux under reasonable control on current regimen.   Genitourinary: Negative for dysuria and difficulty urinating.  Musculoskeletal: Negative for back pain and joint swelling.  Skin: Negative for color change and rash.  Neurological: Negative for dizziness, light-headedness and headaches.  Psychiatric/Behavioral: Negative for dysphoric mood and agitation.       Objective:     Blood pressure rechecked by me:  136/72  Physical Exam  Constitutional: She appears well-developed and well-nourished. No distress.  HENT:  Nose: Nose normal.  Mouth/Throat: Oropharynx is clear and moist.  Eyes: Right eye exhibits  no discharge. Left eye exhibits no discharge. No scleral icterus.  Neck: Neck supple. No thyromegaly present.  Cardiovascular: Normal rate and regular rhythm.   Pulmonary/Chest: Breath sounds normal. No respiratory distress. She has no wheezes.  Abdominal: Soft. Bowel sounds are normal. There is no tenderness.  Musculoskeletal: She exhibits no edema or tenderness.  Lymphadenopathy:    She has no cervical adenopathy.  Skin: No rash noted. No erythema.  Psychiatric: She has a normal mood and affect. Her behavior is normal.    BP 110/70 mmHg  Pulse 57  Temp(Src) 98.2 F (36.8 C) (Oral)  Resp 18  Ht '5\' 1"'$  (1.549 m)  Wt 143 lb 8 oz (65.091 kg)  BMI 27.13 kg/m2  SpO2 97% Wt Readings from Last 3 Encounters:  02/17/15 143 lb 8 oz (65.091 kg)  01/13/15 143 lb 8 oz (65.091 kg)  10/23/14 135 lb (61.236 kg)     Lab Results  Component Value Date   WBC 7.5 07/31/2014   HGB 14.7 07/31/2014   HCT 43.3 07/31/2014   PLT 248.0 07/31/2014   GLUCOSE 95 07/31/2014   CHOL 163 07/31/2014   TRIG 107.0 07/31/2014   HDL 70.20 07/31/2014   LDLCALC 71 07/31/2014   ALT 15 10/02/2014   AST 18 10/02/2014   NA 136 07/31/2014   K 4.1 07/31/2014   CL 100 07/31/2014   CREATININE 1.02 07/31/2014   BUN 30* 07/31/2014   CO2 29 07/31/2014   TSH 3.75 07/31/2014    Mr Lumbar Spine Wo Contrast  01/20/2015  CLINICAL DATA:  Chronic low back and bilateral leg pain worsening over the past 6 months. EXAM: MRI LUMBAR SPINE WITHOUT CONTRAST TECHNIQUE: Multiplanar, multisequence MR imaging of the lumbar spine was performed. No intravenous contrast was administered. COMPARISON:  None. FINDINGS: Severe scoliosis and degenerative lumbar spondylosis with multilevel disc disease and facet disease. The vertebral bodies demonstrate normal marrow signal. No definite pars defects. No significant paraspinal or retroperitoneal findings. Small right renal cysts are noted along with a large left renal cyst. Mild common bile duct  dilatation likely due to prior cholecystectomy. L1-2: Advanced degenerative disc disease and facet disease. Bulging annulus and osteophytic spurring with moderate right lateral recess stenosis. No definite foraminal stenosis. L2-3: Bulging annulus and osteophytic ridging and right-sided facet disease contributing to moderate right lateral recess stenosis. There is also small focal central disc protrusion with mild impression on the ventral thecal sac. Mild right foraminal stenosis. No left foraminal stenosis. L3-4: Bulging degenerated annulus, osteophytic ridging and facet disease with bilateral lateral recess stenosis. No significant foraminal stenosis. L4-5: Moderate facet disease and diffuse bulging annulus with mass effect on the ventral thecal sac. Bilateral lateral recess stenosis. Mild bilateral foraminal stenosis. L5-S1: Advanced disc disease and facet disease. No significant spinal or lateral recess stenosis. Focal left foraminal disc protrusion without direct neural compression. IMPRESSION: 1. Severe scoliosis and degenerative lumbar spondylosis with advanced multilevel disc disease and facet disease. 2. Multilevel multifactorial lateral recess and foraminal stenosis as described above. 3. Focal left foraminal disc protrusion at L5-S1 without direct neural compression. Electronically Signed   By: Marijo Sanes M.D.   On: 01/20/2015 15:43       Assessment & Plan:   Problem List Items Addressed This Visit    Barrett's esophagus    On protonix.  Followed by Dr Tiffany Kocher.  He follows with serial EGDs.        Chronic back pain    Followed by Dr Sharlet Salina.  Stable.        Colon cancer ([place])    Colonoscopy 07/30/10 - internal hemorrhoids.  CEA 1.3 07/21/14.  Followed by GI.        History of pneumonia    CXR 01/13/15 revealed no active cardiopulmonary disease.  Cough and congestion resolved.  She feels better.        Hyperbilirubinemia    Elevated total bilirubin with normal direct bilirubin.  Has  seen GI.  Follow.  Currently asymptomatic.       Hypercholesteremia    On simvastatin.  Low cholesterol diet and exercise.  Follow lipid panel and  liver function tests.        Relevant Medications   losartan-hydrochlorothiazide (HYZAAR) 50-12.5 MG tablet   Hypertension - Primary    Blood pressure as outlined.  On check here under better control.  Continue same medication regimen.  Follow pressures.  She will bring her blood pressure cuff with her to her next visit and we will see if her machine correlates with our cuffs.  Follow met b.        Relevant Medications   losartan-hydrochlorothiazide (HYZAAR) 50-12.5 MG tablet    Other Visit Diagnoses    Elevated ferritin        Relevant Orders    Ferritin    IBC panel        [name], MD

## 2015-02-23 ENCOUNTER — Encounter: Payer: Self-pay | Admitting: Internal Medicine

## 2015-02-23 NOTE — Assessment & Plan Note (Signed)
On protonix.  Followed by Dr Tiffany Kocher.  He follows with serial EGDs.

## 2015-02-23 NOTE — Assessment & Plan Note (Signed)
Colonoscopy 07/30/10 - internal hemorrhoids.  CEA 1.3 07/21/14.  Followed by GI.

## 2015-02-23 NOTE — Assessment & Plan Note (Signed)
On simvastatin.  Low cholesterol diet and exercise.  Follow lipid panel and liver function tests.   

## 2015-02-23 NOTE — Assessment & Plan Note (Signed)
Blood pressure as outlined.  On check here under better control.  Continue same medication regimen.  Follow pressures.  She will bring her blood pressure cuff with her to her next visit and we will see if her machine correlates with our cuffs.  Follow met b.

## 2015-02-23 NOTE — Assessment & Plan Note (Signed)
CXR 01/13/15 revealed no active cardiopulmonary disease.  Cough and congestion resolved.  She feels better.

## 2015-02-23 NOTE — Assessment & Plan Note (Signed)
Followed by Dr Chasnis.  Stable.   

## 2015-02-23 NOTE — Assessment & Plan Note (Signed)
Elevated total bilirubin with normal direct bilirubin.  Has seen GI.  Follow.  Currently asymptomatic.

## 2015-03-08 ENCOUNTER — Other Ambulatory Visit: Payer: Self-pay | Admitting: Internal Medicine

## 2015-03-10 ENCOUNTER — Other Ambulatory Visit (INDEPENDENT_AMBULATORY_CARE_PROVIDER_SITE_OTHER): Payer: Commercial Managed Care - HMO

## 2015-03-10 DIAGNOSIS — E7800 Pure hypercholesterolemia, unspecified: Secondary | ICD-10-CM

## 2015-03-10 DIAGNOSIS — R7989 Other specified abnormal findings of blood chemistry: Secondary | ICD-10-CM | POA: Diagnosis not present

## 2015-03-10 DIAGNOSIS — I10 Essential (primary) hypertension: Secondary | ICD-10-CM

## 2015-03-10 LAB — BASIC METABOLIC PANEL
BUN: 28 mg/dL — AB (ref 6–23)
CHLORIDE: 101 meq/L (ref 96–112)
CO2: 26 mEq/L (ref 19–32)
Calcium: 9.8 mg/dL (ref 8.4–10.5)
Creatinine, Ser: 1.2 mg/dL (ref 0.40–1.20)
GFR: 45.48 mL/min — ABNORMAL LOW (ref >60.00)
GLUCOSE: 88 mg/dL (ref 70–99)
POTASSIUM: 4 meq/L (ref 3.5–5.1)
Sodium: 138 mEq/L (ref 135–145)

## 2015-03-10 LAB — IBC PANEL
Iron: 192 ug/dL — ABNORMAL HIGH (ref 42–145)
SATURATION RATIOS: 50.8 % — AB (ref 20.0–50.0)
Transferrin: 270 mg/dL (ref 212.0–360.0)

## 2015-03-10 LAB — LIPID PANEL
CHOLESTEROL: 179 mg/dL (ref 0–200)
HDL: 78.4 mg/dL (ref >39.00)
LDL Cholesterol: 72 mg/dL (ref 0–99)
NonHDL: 100.98
TRIGLYCERIDES: 144 mg/dL (ref 0.0–149.0)
Total CHOL/HDL Ratio: 2
VLDL: 28.8 mg/dL (ref 0.0–40.0)

## 2015-03-10 LAB — HEPATIC FUNCTION PANEL
ALT: 16 U/L (ref 0–35)
AST: 25 U/L (ref 0–37)
Albumin: 4 g/dL (ref 3.5–5.2)
Alkaline Phosphatase: 40 U/L (ref 39–117)
BILIRUBIN TOTAL: 1.1 mg/dL (ref 0.2–1.2)
Bilirubin, Direct: 0.2 mg/dL (ref 0.0–0.3)
TOTAL PROTEIN: 6.7 g/dL (ref 6.0–8.3)

## 2015-03-10 LAB — FERRITIN: Ferritin: 460.6 ng/mL — ABNORMAL HIGH (ref 10.0–291.0)

## 2015-03-12 ENCOUNTER — Other Ambulatory Visit: Payer: Self-pay | Admitting: Internal Medicine

## 2015-03-12 ENCOUNTER — Other Ambulatory Visit: Payer: Self-pay | Admitting: *Deleted

## 2015-03-12 DIAGNOSIS — R7989 Other specified abnormal findings of blood chemistry: Secondary | ICD-10-CM

## 2015-03-12 NOTE — Progress Notes (Signed)
Orders placed for labs

## 2015-03-18 ENCOUNTER — Other Ambulatory Visit: Payer: Self-pay | Admitting: Internal Medicine

## 2015-03-19 ENCOUNTER — Other Ambulatory Visit (INDEPENDENT_AMBULATORY_CARE_PROVIDER_SITE_OTHER): Payer: Commercial Managed Care - HMO

## 2015-03-19 DIAGNOSIS — R748 Abnormal levels of other serum enzymes: Secondary | ICD-10-CM | POA: Diagnosis not present

## 2015-03-19 DIAGNOSIS — R7989 Other specified abnormal findings of blood chemistry: Secondary | ICD-10-CM

## 2015-03-19 LAB — CBC WITH DIFFERENTIAL/PLATELET
Basophils Absolute: 0 10*3/uL (ref 0.0–0.1)
Basophils Relative: 0.6 % (ref 0.0–3.0)
EOS PCT: 1.9 % (ref 0.0–5.0)
Eosinophils Absolute: 0.1 10*3/uL (ref 0.0–0.7)
HEMATOCRIT: 42.2 % (ref 36.0–46.0)
Hemoglobin: 14.1 g/dL (ref 12.0–15.0)
LYMPHS ABS: 1.9 10*3/uL (ref 0.7–4.0)
LYMPHS PCT: 24.4 % (ref 12.0–46.0)
MCHC: 33.4 g/dL (ref 30.0–36.0)
MCV: 96.9 fl (ref 78.0–100.0)
MONOS PCT: 8.9 % (ref 3.0–12.0)
Monocytes Absolute: 0.7 10*3/uL (ref 0.1–1.0)
NEUTROS ABS: 5.1 10*3/uL (ref 1.4–7.7)
NEUTROS PCT: 64.2 % (ref 43.0–77.0)
PLATELETS: 255 10*3/uL (ref 150.0–400.0)
RBC: 4.36 Mil/uL (ref 3.87–5.11)
RDW: 13.3 % (ref 11.5–15.5)
WBC: 7.9 10*3/uL (ref 4.0–10.5)

## 2015-03-19 LAB — BASIC METABOLIC PANEL
BUN: 30 mg/dL — AB (ref 6–23)
CHLORIDE: 99 meq/L (ref 96–112)
CO2: 28 meq/L (ref 19–32)
CREATININE: 1.12 mg/dL (ref 0.40–1.20)
Calcium: 10.1 mg/dL (ref 8.4–10.5)
GFR: 49.25 mL/min — ABNORMAL LOW (ref >60.00)
Glucose, Bld: 104 mg/dL — ABNORMAL HIGH (ref 70–99)
Potassium: 4 mEq/L (ref 3.5–5.1)
Sodium: 136 mEq/L (ref 135–145)

## 2015-03-23 DIAGNOSIS — M62830 Muscle spasm of back: Secondary | ICD-10-CM | POA: Diagnosis not present

## 2015-03-23 DIAGNOSIS — M5416 Radiculopathy, lumbar region: Secondary | ICD-10-CM | POA: Diagnosis not present

## 2015-03-23 LAB — HEMOCHROMATOSIS DNA-PCR(C282Y,H63D)

## 2015-03-25 ENCOUNTER — Other Ambulatory Visit: Payer: Self-pay | Admitting: Internal Medicine

## 2015-03-25 DIAGNOSIS — M545 Low back pain: Secondary | ICD-10-CM

## 2015-03-25 NOTE — Progress Notes (Signed)
Order placed for referral to Dr Sharlet Salina - appt already scheduled for 05/04/15.  Sent noticed needed referral auth - updated.

## 2015-03-30 DIAGNOSIS — M5416 Radiculopathy, lumbar region: Secondary | ICD-10-CM | POA: Diagnosis not present

## 2015-03-30 DIAGNOSIS — R262 Difficulty in walking, not elsewhere classified: Secondary | ICD-10-CM | POA: Diagnosis not present

## 2015-03-30 DIAGNOSIS — M545 Low back pain: Secondary | ICD-10-CM | POA: Diagnosis not present

## 2015-03-31 ENCOUNTER — Ambulatory Visit (INDEPENDENT_AMBULATORY_CARE_PROVIDER_SITE_OTHER): Payer: Commercial Managed Care - HMO

## 2015-03-31 VITALS — BP 118/60 | HR 69 | Temp 98.3°F | Resp 14 | Ht 61.0 in | Wt 142.8 lb

## 2015-03-31 DIAGNOSIS — Z0000 Encounter for general adult medical examination without abnormal findings: Secondary | ICD-10-CM

## 2015-03-31 NOTE — Patient Instructions (Addendum)
[name]  Thank you for taking time to come for your Medicare Wellness Visit.  I appreciate your ongoing commitment to your health goals. Please review the following plan we discussed and let me know if I can assist you in the future.  Return in 2 weeks for scheduled follow up with Dr. Nicki Reaper.  Bone Densitometry Bone densitometry is an imaging test that uses a special X-ray to measure the amount of calcium and other minerals in your bones (bone density). This test is also known as a bone mineral density test or dual-energy X-ray absorptiometry (DXA). The test can measure bone density at your hip and your spine. It is similar to having a regular X-ray. You may have this test to:  Diagnose a condition that causes weak or thin bones (osteoporosis).  Predict your risk of a broken bone (fracture).  Determine how well osteoporosis treatment is working. LET [facility] CARE PROVIDER KNOW ABOUT:  Any allergies you have.  All medicines you are taking, including vitamins, herbs, eye drops, creams, and over-the-counter medicines.  Previous problems you or members of your family have had with the use of anesthetics.  Any blood disorders you have.  Previous surgeries you have had.  Medical conditions you have.  Possibility of pregnancy.  Any other medical test you had within the previous 14 days that used contrast material. RISKS AND COMPLICATIONS Generally, this is a safe procedure. However, problems can occur and may include the following:  This test exposes you to a very small amount of radiation.  The risks of radiation exposure may be greater to unborn children. BEFORE THE PROCEDURE  Do not take any calcium supplements for 24 hours before having the test. You can otherwise eat and drink what you usually do.  Take off all metal jewelry, eyeglasses, dental appliances, and any other metal objects. PROCEDURE  You may lie on an exam table. There will be an X-ray generator below you  and an imaging device above you.  Other devices, such as boxes or braces, may be used to position your body properly for the scan.  You will need to lie still while the machine slowly scans your body.  The images will show up on a computer monitor. AFTER THE PROCEDURE You may need more testing at a later time.   This information is not intended to replace advice given to you by your health care provider. Make sure you discuss any questions you have with your health care provider.   Document Released: 02/23/2004 Document Revised: 02/21/2014 Document Reviewed: 07/11/2013 Elsevier Interactive Patient Education 2016 [place] in the Home  Falls can cause injuries. They can happen to people of all ages. There are many things you can do to make your home safe and to help prevent falls.  WHAT CAN I DO ON THE OUTSIDE OF MY HOME?  Regularly fix the edges of walkways and driveways and fix any cracks.  Remove anything that might make you trip as you walk through a door, such as a raised step or threshold.  Trim any bushes or trees on the path to your home.  Use bright outdoor lighting.  Clear any walking paths of anything that might make someone trip, such as rocks or tools.  Regularly check to see if handrails are loose or broken. Make sure that both sides of any steps have handrails.  Any raised decks and porches should have guardrails on the edges.  Have any leaves, snow, or ice  cleared regularly.  Use sand or salt on walking paths during winter.  Clean up any spills in your garage right away. This includes oil or grease spills. WHAT CAN I DO IN THE BATHROOM?   Use night lights.  Install grab bars by the toilet and in the tub and shower. Do not use towel bars as grab bars.  Use non-skid mats or decals in the tub or shower.  If you need to sit down in the shower, use a plastic, non-slip stool.  Keep the floor dry. Clean up any water that spills on the  floor as soon as it happens.  Remove soap buildup in the tub or shower regularly.  Attach bath mats securely with double-sided non-slip rug tape.  Do not have throw rugs and other things on the floor that can make you trip. WHAT CAN I DO IN THE BEDROOM?  Use night lights.  Make sure that you have a light by your bed that is easy to reach.  Do not use any sheets or blankets that are too big for your bed. They should not hang down onto the floor.  Have a firm chair that has side arms. You can use this for support while you get dressed.  Do not have throw rugs and other things on the floor that can make you trip. WHAT CAN I DO IN THE KITCHEN?  Clean up any spills right away.  Avoid walking on wet floors.  Keep items that you use a lot in easy-to-reach places.  If you need to reach something above you, use a strong step stool that has a grab bar.  Keep electrical cords out of the way.  Do not use floor polish or wax that makes floors slippery. If you must use wax, use non-skid floor wax.  Do not have throw rugs and other things on the floor that can make you trip. WHAT CAN I DO WITH MY STAIRS?  Do not leave any items on the stairs.  Make sure that there are handrails on both sides of the stairs and use them. Fix handrails that are broken or loose. Make sure that handrails are as long as the stairways.  Check any carpeting to make sure that it is firmly attached to the stairs. Fix any carpet that is loose or worn.  Avoid having throw rugs at the top or bottom of the stairs. If you do have throw rugs, attach them to the floor with carpet tape.  Make sure that you have a light switch at the top of the stairs and the bottom of the stairs. If you do not have them, ask someone to add them for you. WHAT ELSE CAN I DO TO HELP PREVENT FALLS?  Wear shoes that:  Do not have high heels.  Have rubber bottoms.  Are comfortable and fit you well.  Are closed at the toe. Do not wear  sandals.  If you use a stepladder:  Make sure that it is fully opened. Do not climb a closed stepladder.  Make sure that both sides of the stepladder are locked into place.  Ask someone to hold it for you, if possible.  Clearly mark and make sure that you can see:  Any grab bars or handrails.  First and last steps.  Where the edge of each step is.  Use tools that help you move around (mobility aids) if they are needed. These include:  Canes.  Walkers.  Scooters.  Crutches.  Turn on the  lights when you go into a dark area. Replace any light bulbs as soon as they burn out.  Set up your furniture so you have a clear path. Avoid moving your furniture around.  If any of your floors are uneven, fix them.  If there are any pets around you, be aware of where they are.  Review your medicines with your doctor. Some medicines can make you feel dizzy. This can increase your chance of falling. Ask your doctor what other things that you can do to help prevent falls.   This information is not intended to replace advice given to you by your health care provider. Make sure you discuss any questions you have with your health care provider.   Document Released: 11/27/2008 Document Revised: 06/17/2014 Document Reviewed: 03/07/2014 Elsevier Interactive Patient Education [employer].

## 2015-03-31 NOTE — Progress Notes (Signed)
Subjective:   [name] is [name] 82 y.o. female who presents for an Initial Medicare Annual Wellness Visit.  Review of Systems    No ROS.  Medicare Wellness Visit.  Cardiac Risk Factors include: advanced age (>53men, >60 women);hypertension     Objective:    Today's Vitals   03/31/15 1312  BP: 118/60  Pulse: 69  Temp: 98.3 F (36.8 C)  TempSrc: Oral  Resp: 14  Height: 5\' 1"  (1.549 m)  Weight: 142 lb 12.8 oz (64.774 kg)  SpO2: 98%    Current Medications (verified) Outpatient Encounter Prescriptions as of 03/31/2015  Medication Sig  . Calcium Carbonate-Vitamin D (CALCIUM 600 + D PO) Take 600 Units by mouth daily.   . Cholecalciferol (VITAMIN D3) 2000 UNITS TABS Take 2,000 Units by mouth daily.  Marland Kitchen COLACE 100 MG capsule Take 1 capsule (100 mg total) by mouth 2 (two) times daily.  Marland Kitchen estradiol (ESTRACE) 2 MG tablet TAKE 1 TABLET EVERY DAY  . fexofenadine (ALLEGRA) 60 MG tablet TAKE 1 TABLET EVERY DAY  . fish oil-omega-3 fatty acids 1000 MG capsule Take 2 g by mouth daily.  . fluticasone (FLOVENT HFA) 110 MCG/ACT inhaler Inhale 2 puffs into the lungs 2 (two) times daily.  Marland Kitchen lactase (LACTAID) 3000 UNITS tablet Take 1 tablet by mouth as needed.  . lidocaine (LIDODERM) 5 % Place 1 patch onto the skin as needed. Remove & Discard patch within 12 hours or as directed by MD  . lidocaine (XYLOCAINE) 4 % external solution Apply topically 2 (two) times daily as needed.  Marland Kitchen losartan-hydrochlorothiazide (HYZAAR) 50-12.5 MG tablet TAKE 1 TABLET BY MOUTH DAILY.  . meclizine (ANTIVERT) 25 MG tablet Take 25 mg by mouth 3 (three) times daily as needed.  . meloxicam (MOBIC) 7.5 MG tablet Take 7.5 mg by mouth daily.  . metoCLOPramide (REGLAN) 5 MG tablet Take 5 mg by mouth. Take one tablet three times [name] day  . metoprolol succinate (TOPROL-XL) 25 MG 24 hr tablet Take 1 tablet (25 mg total) by mouth daily.  Marland Kitchen oxyCODONE (OXY IR/ROXICODONE) 5 MG immediate release tablet Take 5 mg by mouth 2 (two) times  daily as needed.   . pantoprazole (PROTONIX) 40 MG tablet Take 40 mg by mouth 2 (two) times daily.  . simvastatin (ZOCOR) 10 MG tablet TAKE 1 TABLET AT BEDTIME  . solifenacin (VESICARE) 5 MG tablet Take 1 tablet (5 mg total) by mouth daily.  . traMADol-acetaminophen (ULTRACET) 37.5-325 MG per tablet Take 1 tablet by mouth every 8 (eight) hours as needed.  . VENTOLIN HFA 108 (90 BASE) MCG/ACT inhaler 1-2 INHALATIONS EVERY 4-6 HOURS AS NEEDED FOR WHEEZING. DISPENSE SPACER AS NEEDED.   No facility-administered encounter medications on file as of 03/31/2015.    Allergies (verified) Review of patient's allergies indicates no known allergies.   History: Past Medical History  Diagnosis Date  . Bowel obstruction (HCC)     s/p adhesion resection  . Recurrent sinus infections   . Vertigo   . Barrett's esophagus   . Hiatal hernia   . Hypercholesteremia   . Hypertension   . Chronic back pain   . Peripheral neuropathy ([place])   . Colon cancer ([place]) 1988 and 1989    adenocarcinoma, s/p resection x  2 and chemo  . S/P chemotherapy, time since greater than 12 weeks     colon cancer   Past Surgical History  Procedure Laterality Date  . Colon resection      x2.  s/p colon cancer  . Adhesions resected      bowel obstruction  . Cholecystectomy    . Abdominal hysterectomy  1982  . Appendectomy    . Excisional hemorrhoidectomy      with tubal ligation  . Breast biopsy Left     negative 06/14/1985   Family History  Problem Relation Age of Onset  . Breast cancer Mother   . Stroke Father   . Hypertension Father   . Diabetes Father   . Asthma Mother   . Ovarian cancer Sister     x2  . Prostate cancer Brother   . Hypertension Brother     x3  . Hypercholesterolemia Brother     x3  . Diabetes Brother    Social History   Occupational History  . homemaker    Social History Main Topics  . Smoking status: Never Smoker   . Smokeless tobacco: Never Used  . Alcohol Use: No  . Drug Use:  No  . Sexual Activity: No    Tobacco Counseling Counseling given: Not Answered   Activities of Daily Living In your present state of health, do you have any difficulty performing the following activities: 03/31/2015  Hearing? N  Vision? N  Difficulty concentrating or making decisions? N  Walking or climbing stairs? Y  Dressing or bathing? N  Doing errands, shopping? N  Preparing Food and eating ? N  Using the Toilet? N  In the past six months, have you accidently leaked urine? N  Do you have problems with loss of bowel control? N  Managing your Medications? N  Managing your Finances? N  Housekeeping or managing your Housekeeping? N    Immunizations and Health Maintenance Immunization History  Administered Date(s) Administered  . Influenza Split 12/08/2011  . Influenza,inj,Quad PF,36+ Mos 10/31/2013, 10/23/2014  . Pneumococcal Conjugate-13 05/07/2013  . Pneumococcal-Unspecified 11/30/2000, 01/04/2006  . Td 11/20/2007   Health Maintenance Due  Topic Date Due  . DEXA SCAN  02/05/1996    Patient Care Team: [name], MD as PCP - General (Internal Medicine)  Indicate any recent Medical Services you may have received from other than [facility] providers in the past year (date may be approximate).     Assessment:   This is [name] routine wellness examination for [name]. The goal of the wellness visit is to assist the patient how to close the gaps in care and create [name] preventative care plan for the patient.   Taking VIT D Calcium as appropriate/ Osteoporosis risk reviewed. DEXA Scan postponed, per patient request.  Medications reviewed; taking without issues or barriers.  Safety issues reviewed; smoke detectors in the home. No firearms in the home. Wears seatbelts when driving or riding with others. No violence in the home.  No identified risk were noted; The patient was oriented x 3; appropriate in dress and manner and no objective failures at ADL's or IADL's.    Patient Concerns: Requests results from most recent re-check of labs when available.  Hearing/Vision screen Hearing Screening Comments: Passes the whisper test Vision Screening Comments: Followed by Katrina Annual visits Wears contacts/glasses  Dietary issues and exercise activities discussed: Current Exercise Habits:: Structured exercise class (Attends PT), Time (Minutes): 60, Frequency (Times/Week): 2, Weekly Exercise (Minutes/Week): 120, Intensity: Mild  Goals    . Healthy Lifestyle     Maintain physical therapy regiment at home  Stay hydrated, drink plenty of water Low carb foods      Depression Screen PHQ 2/9 Scores 03/31/2015  10/23/2014 05/07/2013 04/22/2012 12/18/2011  PHQ - 2 Score 0 0 0 0 0    Fall Risk Fall Risk  03/31/2015 10/23/2014 05/07/2013 04/22/2012  Falls in the past year? No No No No    Cognitive Function: MMSE - Mini Mental State Exam 03/31/2015  Orientation to time 5  Orientation to Place 5  Registration 3  Attention/ Calculation 5  Recall 3  Language- name 2 objects 2  Language- repeat 1  Language- follow 3 step command 3  Language- read & follow direction 1  Write [name] sentence 1  Copy design 1  Total score 30    Screening Tests Health Maintenance  Topic Date Due  . DEXA SCAN  02/05/1996  . INFLUENZA VACCINE  09/15/2015  . MAMMOGRAM  10/28/2015  . TETANUS/TDAP  11/19/2017  . ZOSTAVAX  Addressed  . PNA vac Low Risk Adult  Completed      Plan:   End of life planning; Advance aging; Advanced directives discussed. Copy of current HCPOA/Living Will requested.    During the course of the visit, Kimorra was educated and counseled about the following appropriate screening and preventive services:   Vaccines to include Pneumoccal, Influenza, Hepatitis B, Td, Zostavax, HCV  Electrocardiogram  Cardiovascular disease screening  Colorectal cancer screening  Bone density screening  Diabetes screening  Glaucoma  screening  Mammography/PAP  Nutrition counseling  Smoking cessation counseling  Patient Instructions (the written plan) were given to the patient.    Varney Biles, LPN   [id number]    Reviewed above information.  Agree with plan.    Dr Nicki Reaper

## 2015-04-02 DIAGNOSIS — R262 Difficulty in walking, not elsewhere classified: Secondary | ICD-10-CM | POA: Diagnosis not present

## 2015-04-02 DIAGNOSIS — M5416 Radiculopathy, lumbar region: Secondary | ICD-10-CM | POA: Diagnosis not present

## 2015-04-02 DIAGNOSIS — M545 Low back pain: Secondary | ICD-10-CM | POA: Diagnosis not present

## 2015-04-07 DIAGNOSIS — M5416 Radiculopathy, lumbar region: Secondary | ICD-10-CM | POA: Diagnosis not present

## 2015-04-07 DIAGNOSIS — M545 Low back pain: Secondary | ICD-10-CM | POA: Diagnosis not present

## 2015-04-07 DIAGNOSIS — R262 Difficulty in walking, not elsewhere classified: Secondary | ICD-10-CM | POA: Diagnosis not present

## 2015-04-09 DIAGNOSIS — M545 Low back pain: Secondary | ICD-10-CM | POA: Diagnosis not present

## 2015-04-09 DIAGNOSIS — M5416 Radiculopathy, lumbar region: Secondary | ICD-10-CM | POA: Diagnosis not present

## 2015-04-09 DIAGNOSIS — R262 Difficulty in walking, not elsewhere classified: Secondary | ICD-10-CM | POA: Diagnosis not present

## 2015-04-13 DIAGNOSIS — R262 Difficulty in walking, not elsewhere classified: Secondary | ICD-10-CM | POA: Diagnosis not present

## 2015-04-13 DIAGNOSIS — M5416 Radiculopathy, lumbar region: Secondary | ICD-10-CM | POA: Diagnosis not present

## 2015-04-13 DIAGNOSIS — M545 Low back pain: Secondary | ICD-10-CM | POA: Diagnosis not present

## 2015-04-14 ENCOUNTER — Ambulatory Visit (INDEPENDENT_AMBULATORY_CARE_PROVIDER_SITE_OTHER): Payer: Commercial Managed Care - HMO | Admitting: Internal Medicine

## 2015-04-14 ENCOUNTER — Encounter: Payer: Self-pay | Admitting: Internal Medicine

## 2015-04-14 VITALS — BP 130/80 | HR 60 | Temp 98.1°F | Resp 18 | Ht 61.0 in | Wt 145.0 lb

## 2015-04-14 DIAGNOSIS — I10 Essential (primary) hypertension: Secondary | ICD-10-CM

## 2015-04-14 DIAGNOSIS — E7800 Pure hypercholesterolemia, unspecified: Secondary | ICD-10-CM | POA: Diagnosis not present

## 2015-04-14 DIAGNOSIS — Z85038 Personal history of other malignant neoplasm of large intestine: Secondary | ICD-10-CM

## 2015-04-14 DIAGNOSIS — M549 Dorsalgia, unspecified: Secondary | ICD-10-CM

## 2015-04-14 DIAGNOSIS — R42 Dizziness and giddiness: Secondary | ICD-10-CM | POA: Diagnosis not present

## 2015-04-14 DIAGNOSIS — G8929 Other chronic pain: Secondary | ICD-10-CM

## 2015-04-14 NOTE — Progress Notes (Signed)
Patient ID: [name], female   DOB: 06-09-1928, 82 y.o.   MRN: [id number]   Subjective:    Patient ID: [name], female    DOB: 1928-03-17, 82 y.o.   MRN: [id number]  HPI  Patient with past history of hypercholesterolemia, chronic back pain, colon cancer and hypertension.  She comes in today to follow up on these issues.  Cough and congestion resolved.  She is eating.  Overall feels better.  Breathing stable.  No chest pain or tightness.  No acid reflux reported.  No abdominal pain or cramping.  Bowels stable.  Does report noticing dizziness.  States only occurs when she lies on her left side.  Describes room spinning.  Persistent over the last couple of weeks.  No headache.  She has been checking her blood pressure with outside cuff.  Varying blood pressures - 121-172/50-60s.  Brought in cuff today.  Her cuff measuring higher than our readings.     Past Medical History  Diagnosis Date  . Bowel obstruction (HCC)     s/p adhesion resection  . Recurrent sinus infections   . Vertigo   . Barrett's esophagus   . Hiatal hernia   . Hypercholesteremia   . Hypertension   . Chronic back pain   . Peripheral neuropathy ([place])   . Colon cancer ([place]) 1988 and 1989    adenocarcinoma, s/p resection x  2 and chemo  . S/P chemotherapy, time since greater than 12 weeks     colon cancer   Past Surgical History  Procedure Laterality Date  . Colon resection      x2.  s/p colon cancer  . Adhesions resected      bowel obstruction  . Cholecystectomy    . Abdominal hysterectomy  1982  . Appendectomy    . Excisional hemorrhoidectomy      with tubal ligation  . Breast biopsy Left     negative 06/14/1985   Family History  Problem Relation Age of Onset  . Breast cancer Mother   . Stroke Father   . Hypertension Father   . Diabetes Father   . Asthma Mother   . Ovarian cancer Sister     x2  . Prostate cancer Brother   . Hypertension Brother     x3  . Hypercholesterolemia Brother     x3    . Diabetes Brother    Social History   Social History  . Marital Status: Married    Spouse Name: N/A  . Number of Children: 4  . Years of Education: 12th grade   Occupational History  . homemaker    Social History Main Topics  . Smoking status: Never Smoker   . Smokeless tobacco: Never Used  . Alcohol Use: No  . Drug Use: No  . Sexual Activity: No   Other Topics Concern  . None   Social History Narrative    Outpatient Encounter Prescriptions as of 04/14/2015  Medication Sig  . Calcium Carbonate-Vitamin D (CALCIUM 600 + D PO) Take 600 Units by mouth daily.   . Cholecalciferol (VITAMIN D3) 2000 UNITS TABS Take 2,000 Units by mouth daily.  Marland Kitchen COLACE 100 MG capsule Take 1 capsule (100 mg total) by mouth 2 (two) times daily.  Marland Kitchen estradiol (ESTRACE) 2 MG tablet TAKE 1 TABLET EVERY DAY  . fexofenadine (ALLEGRA) 60 MG tablet TAKE 1 TABLET EVERY DAY  . fish oil-omega-3 fatty acids 1000 MG capsule Take 2 g by mouth daily.  . fluticasone (  FLOVENT HFA) 110 MCG/ACT inhaler Inhale 2 puffs into the lungs 2 (two) times daily.  Marland Kitchen lactase (LACTAID) 3000 UNITS tablet Take 1 tablet by mouth as needed.  . lidocaine (LIDODERM) 5 % Place 1 patch onto the skin as needed. Remove & Discard patch within 12 hours or as directed by MD  . lidocaine (XYLOCAINE) 4 % external solution Apply topically 2 (two) times daily as needed.  Marland Kitchen losartan-hydrochlorothiazide (HYZAAR) 50-12.5 MG tablet TAKE 1 TABLET BY MOUTH DAILY.  . meclizine (ANTIVERT) 25 MG tablet Take 25 mg by mouth 3 (three) times daily as needed.  . meloxicam (MOBIC) 7.5 MG tablet Take 7.5 mg by mouth daily.  . metoCLOPramide (REGLAN) 5 MG tablet Take 5 mg by mouth. Take one tablet three times a day  . metoprolol succinate (TOPROL-XL) 25 MG 24 hr tablet Take 1 tablet (25 mg total) by mouth daily.  Marland Kitchen oxyCODONE (OXY IR/ROXICODONE) 5 MG immediate release tablet Take 5 mg by mouth 2 (two) times daily as needed.   . pantoprazole (PROTONIX) 40 MG  tablet Take 40 mg by mouth 2 (two) times daily.  . simvastatin (ZOCOR) 10 MG tablet TAKE 1 TABLET AT BEDTIME  . solifenacin (VESICARE) 5 MG tablet Take 1 tablet (5 mg total) by mouth daily.  . traMADol-acetaminophen (ULTRACET) 37.5-325 MG per tablet Take 1 tablet by mouth every 8 (eight) hours as needed.  . VENTOLIN HFA 108 (90 BASE) MCG/ACT inhaler 1-2 INHALATIONS EVERY 4-6 HOURS AS NEEDED FOR WHEEZING. DISPENSE SPACER AS NEEDED.   No facility-administered encounter medications on file as of 04/14/2015.    Review of Systems  Constitutional: Negative for appetite change and unexpected weight change.  HENT: Negative for congestion and sinus pressure.   Respiratory: Negative for cough, chest tightness and shortness of breath.   Cardiovascular: Negative for chest pain, palpitations and leg swelling.  Gastrointestinal: Negative for nausea, vomiting, abdominal pain and diarrhea.  Genitourinary: Negative for dysuria and difficulty urinating.  Musculoskeletal: Positive for back pain (chronic back pain). Negative for joint swelling.  Skin: Negative for color change and rash.  Neurological: Positive for dizziness and light-headedness. Negative for headaches.  Psychiatric/Behavioral: Negative for dysphoric mood and agitation.       Objective:     Blood pressure rechecked by me:  136/78  Physical Exam  Constitutional: She appears well-developed and well-nourished. No distress.  HENT:  Nose: Nose normal.  Mouth/Throat: Oropharynx is clear and moist.  Eyes: Conjunctivae are normal. Right eye exhibits no discharge. Left eye exhibits no discharge.  Neck: Neck supple. No thyromegaly present.  Cardiovascular: Normal rate and regular rhythm.   Pulmonary/Chest: Breath sounds normal. No respiratory distress. She has no wheezes.  Abdominal: Soft. Bowel sounds are normal. There is no tenderness.  Musculoskeletal: She exhibits no edema or tenderness.  Lymphadenopathy:    She has no cervical  adenopathy.  Neurological:  When lying down and turning her head left - describes different sensation in her head.    Skin: No rash noted. No erythema.  Psychiatric: She has a normal mood and affect. Her behavior is normal.    BP 130/80 mmHg  Pulse 60  Temp(Src) 98.1 F (36.7 C) (Oral)  Resp 18  Ht 5\' 1"  (1.549 m)  Wt 145 lb (65.772 kg)  BMI 27.41 kg/m2  SpO2 96% Wt Readings from Last 3 Encounters:  04/14/15 145 lb (65.772 kg)  03/31/15 142 lb 12.8 oz (64.774 kg)  02/17/15 143 lb 8 oz (65.091 kg)  Lab Results  Component Value Date   WBC 7.9 03/19/2015   HGB 14.1 03/19/2015   HCT 42.2 03/19/2015   PLT 255.0 03/19/2015   GLUCOSE 104* 03/19/2015   CHOL 179 03/10/2015   TRIG 144.0 03/10/2015   HDL 78.40 03/10/2015   LDLCALC 72 03/10/2015   ALT 16 03/10/2015   AST 25 03/10/2015   NA 136 03/19/2015   K 4.0 03/19/2015   CL 99 03/19/2015   CREATININE 1.12 03/19/2015   BUN 30* 03/19/2015   CO2 28 03/19/2015   TSH 3.75 07/31/2014    Mr Lumbar Spine Wo Contrast  01/20/2015  CLINICAL DATA:  Chronic low back and bilateral leg pain worsening over the past 6 months. EXAM: MRI LUMBAR SPINE WITHOUT CONTRAST TECHNIQUE: Multiplanar, multisequence MR imaging of the lumbar spine was performed. No intravenous contrast was administered. COMPARISON:  None. FINDINGS: Severe scoliosis and degenerative lumbar spondylosis with multilevel disc disease and facet disease. The vertebral bodies demonstrate normal marrow signal. No definite pars defects. No significant paraspinal or retroperitoneal findings. Small right renal cysts are noted along with a large left renal cyst. Mild common bile duct dilatation likely due to prior cholecystectomy. L1-2: Advanced degenerative disc disease and facet disease. Bulging annulus and osteophytic spurring with moderate right lateral recess stenosis. No definite foraminal stenosis. L2-3: Bulging annulus and osteophytic ridging and right-sided facet disease  contributing to moderate right lateral recess stenosis. There is also small focal central disc protrusion with mild impression on the ventral thecal sac. Mild right foraminal stenosis. No left foraminal stenosis. L3-4: Bulging degenerated annulus, osteophytic ridging and facet disease with bilateral lateral recess stenosis. No significant foraminal stenosis. L4-5: Moderate facet disease and diffuse bulging annulus with mass effect on the ventral thecal sac. Bilateral lateral recess stenosis. Mild bilateral foraminal stenosis. L5-S1: Advanced disc disease and facet disease. No significant spinal or lateral recess stenosis. Focal left foraminal disc protrusion without direct neural compression. IMPRESSION: 1. Severe scoliosis and degenerative lumbar spondylosis with advanced multilevel disc disease and facet disease. 2. Multilevel multifactorial lateral recess and foraminal stenosis as described above. 3. Focal left foraminal disc protrusion at L5-S1 without direct neural compression. Electronically Signed   By: Marijo Sanes M.D.   On: 01/20/2015 15:43       Assessment & Plan:   Problem List Items Addressed This Visit    Chronic back pain    Followed by Dr Sharlet Salina.  Stable.        Dizziness    Dizziness noted with lying on her left side.  Described as room spinning.  Persistent.  No other neurological issues. Refer to ENT given persistent symptoms.        Relevant Orders   Ambulatory referral to ENT   History of colon cancer    Colonoscopy 07/30/10 - internal hemorrhoids.  CEA 1.3 07/2014.  Followed by GI.       Hypercholesteremia    On simvastatin.  Low cholesterol diet and exercise.  Follow lipid panel and liver function tests.        Relevant Orders   Lipid panel   Hepatic function panel   Hypertension - Primary    Her cuff measures higher.  Blood pressures here look good.  Continue same medication regimen.  Follow pressures.  Follow metabolic panel.        Relevant Orders   TSH    Basic metabolic panel   Iron excess    Recently found to have elevated iron.  Hemochromatosis profile checked  and returned homozygous for one mutation.  Will have hematology evaluate.        Relevant Orders   Ambulatory referral to Hematology       [name], MD

## 2015-04-14 NOTE — Progress Notes (Signed)
Pre-visit discussion using our clinic review tool. No additional management support is needed unless otherwise documented below in the visit note.  

## 2015-04-16 ENCOUNTER — Encounter: Payer: Self-pay | Admitting: Internal Medicine

## 2015-04-16 NOTE — Assessment & Plan Note (Signed)
Her cuff measures higher.  Blood pressures here look good.  Continue same medication regimen.  Follow pressures.  Follow metabolic panel.

## 2015-04-16 NOTE — Assessment & Plan Note (Signed)
Followed by Dr Chasnis.  Stable.   

## 2015-04-16 NOTE — Assessment & Plan Note (Signed)
On simvastatin.  Low cholesterol diet and exercise.  Follow lipid panel and liver function tests.   

## 2015-04-16 NOTE — Assessment & Plan Note (Signed)
Colonoscopy 07/30/10 - internal hemorrhoids.  CEA 1.3 07/2014.  Followed by GI.

## 2015-04-16 NOTE — Assessment & Plan Note (Signed)
Recently found to have elevated iron.  Hemochromatosis profile checked and returned homozygous for one mutation.  Will have hematology evaluate.

## 2015-04-16 NOTE — Assessment & Plan Note (Signed)
Dizziness noted with lying on her left side.  Described as room spinning.  Persistent.  No other neurological issues. Refer to ENT given persistent symptoms.

## 2015-04-20 DIAGNOSIS — M5416 Radiculopathy, lumbar region: Secondary | ICD-10-CM | POA: Diagnosis not present

## 2015-04-20 DIAGNOSIS — M545 Low back pain: Secondary | ICD-10-CM | POA: Diagnosis not present

## 2015-04-20 DIAGNOSIS — R262 Difficulty in walking, not elsewhere classified: Secondary | ICD-10-CM | POA: Diagnosis not present

## 2015-04-23 DIAGNOSIS — M545 Low back pain: Secondary | ICD-10-CM | POA: Diagnosis not present

## 2015-04-23 DIAGNOSIS — R262 Difficulty in walking, not elsewhere classified: Secondary | ICD-10-CM | POA: Diagnosis not present

## 2015-04-23 DIAGNOSIS — M5416 Radiculopathy, lumbar region: Secondary | ICD-10-CM | POA: Diagnosis not present

## 2015-04-27 DIAGNOSIS — R262 Difficulty in walking, not elsewhere classified: Secondary | ICD-10-CM | POA: Diagnosis not present

## 2015-04-27 DIAGNOSIS — M545 Low back pain: Secondary | ICD-10-CM | POA: Diagnosis not present

## 2015-04-27 DIAGNOSIS — M5416 Radiculopathy, lumbar region: Secondary | ICD-10-CM | POA: Diagnosis not present

## 2015-04-30 DIAGNOSIS — M545 Low back pain: Secondary | ICD-10-CM | POA: Diagnosis not present

## 2015-04-30 DIAGNOSIS — M5416 Radiculopathy, lumbar region: Secondary | ICD-10-CM | POA: Diagnosis not present

## 2015-04-30 DIAGNOSIS — R262 Difficulty in walking, not elsewhere classified: Secondary | ICD-10-CM | POA: Diagnosis not present

## 2015-05-04 DIAGNOSIS — M5136 Other intervertebral disc degeneration, lumbar region: Secondary | ICD-10-CM | POA: Diagnosis not present

## 2015-05-04 DIAGNOSIS — M5416 Radiculopathy, lumbar region: Secondary | ICD-10-CM | POA: Diagnosis not present

## 2015-05-04 DIAGNOSIS — M62830 Muscle spasm of back: Secondary | ICD-10-CM | POA: Diagnosis not present

## 2015-05-05 ENCOUNTER — Inpatient Hospital Stay: Payer: Commercial Managed Care - HMO | Admitting: Oncology

## 2015-05-05 ENCOUNTER — Other Ambulatory Visit: Payer: Self-pay | Admitting: Otolaryngology

## 2015-05-05 DIAGNOSIS — R42 Dizziness and giddiness: Secondary | ICD-10-CM | POA: Diagnosis not present

## 2015-05-07 DIAGNOSIS — M5416 Radiculopathy, lumbar region: Secondary | ICD-10-CM | POA: Diagnosis not present

## 2015-05-07 DIAGNOSIS — M545 Low back pain: Secondary | ICD-10-CM | POA: Diagnosis not present

## 2015-05-07 DIAGNOSIS — R262 Difficulty in walking, not elsewhere classified: Secondary | ICD-10-CM | POA: Diagnosis not present

## 2015-05-12 ENCOUNTER — Ambulatory Visit
Admission: RE | Admit: 2015-05-12 | Discharge: 2015-05-12 | Disposition: A | Discharge status: Home / Self Care | Payer: Commercial Managed Care - HMO | Source: Ambulatory Visit | Attending: Otolaryngology | Admitting: Otolaryngology

## 2015-05-12 DIAGNOSIS — R42 Dizziness and giddiness: Secondary | ICD-10-CM | POA: Insufficient documentation

## 2015-05-12 DIAGNOSIS — I6523 Occlusion and stenosis of bilateral carotid arteries: Secondary | ICD-10-CM | POA: Insufficient documentation

## 2015-05-14 DIAGNOSIS — R262 Difficulty in walking, not elsewhere classified: Secondary | ICD-10-CM | POA: Diagnosis not present

## 2015-05-14 DIAGNOSIS — M5416 Radiculopathy, lumbar region: Secondary | ICD-10-CM | POA: Diagnosis not present

## 2015-05-14 DIAGNOSIS — M545 Low back pain: Secondary | ICD-10-CM | POA: Diagnosis not present

## 2015-05-18 DIAGNOSIS — M545 Low back pain: Secondary | ICD-10-CM | POA: Diagnosis not present

## 2015-05-18 DIAGNOSIS — M5416 Radiculopathy, lumbar region: Secondary | ICD-10-CM | POA: Diagnosis not present

## 2015-05-18 DIAGNOSIS — R262 Difficulty in walking, not elsewhere classified: Secondary | ICD-10-CM | POA: Diagnosis not present

## 2015-05-19 ENCOUNTER — Inpatient Hospital Stay: Payer: Commercial Managed Care - HMO | Attending: Oncology | Admitting: Oncology

## 2015-05-19 ENCOUNTER — Encounter: Payer: Self-pay | Admitting: Family Medicine

## 2015-05-19 ENCOUNTER — Ambulatory Visit (INDEPENDENT_AMBULATORY_CARE_PROVIDER_SITE_OTHER): Payer: Commercial Managed Care - HMO | Admitting: Family Medicine

## 2015-05-19 VITALS — BP 116/70 | HR 64 | Temp 98.3°F | Ht 61.0 in | Wt 143.4 lb

## 2015-05-19 DIAGNOSIS — Z85038 Personal history of other malignant neoplasm of large intestine: Secondary | ICD-10-CM | POA: Insufficient documentation

## 2015-05-19 DIAGNOSIS — M549 Dorsalgia, unspecified: Secondary | ICD-10-CM

## 2015-05-19 DIAGNOSIS — G629 Polyneuropathy, unspecified: Secondary | ICD-10-CM | POA: Diagnosis not present

## 2015-05-19 DIAGNOSIS — R7989 Other specified abnormal findings of blood chemistry: Secondary | ICD-10-CM | POA: Insufficient documentation

## 2015-05-19 DIAGNOSIS — S81801A Unspecified open wound, right lower leg, initial encounter: Secondary | ICD-10-CM | POA: Diagnosis not present

## 2015-05-19 DIAGNOSIS — R634 Abnormal weight loss: Secondary | ICD-10-CM | POA: Insufficient documentation

## 2015-05-19 DIAGNOSIS — G8929 Other chronic pain: Secondary | ICD-10-CM | POA: Insufficient documentation

## 2015-05-19 DIAGNOSIS — E7800 Pure hypercholesterolemia, unspecified: Secondary | ICD-10-CM | POA: Diagnosis not present

## 2015-05-19 DIAGNOSIS — Z9221 Personal history of antineoplastic chemotherapy: Secondary | ICD-10-CM | POA: Insufficient documentation

## 2015-05-19 DIAGNOSIS — R600 Localized edema: Secondary | ICD-10-CM | POA: Insufficient documentation

## 2015-05-19 DIAGNOSIS — Z8041 Family history of malignant neoplasm of ovary: Secondary | ICD-10-CM | POA: Diagnosis not present

## 2015-05-19 DIAGNOSIS — K2270 Barrett's esophagus without dysplasia: Secondary | ICD-10-CM

## 2015-05-19 DIAGNOSIS — Z8042 Family history of malignant neoplasm of prostate: Secondary | ICD-10-CM | POA: Insufficient documentation

## 2015-05-19 DIAGNOSIS — Z79899 Other long term (current) drug therapy: Secondary | ICD-10-CM | POA: Insufficient documentation

## 2015-05-19 DIAGNOSIS — S81809A Unspecified open wound, unspecified lower leg, initial encounter: Secondary | ICD-10-CM | POA: Insufficient documentation

## 2015-05-19 DIAGNOSIS — Z803 Family history of malignant neoplasm of breast: Secondary | ICD-10-CM | POA: Diagnosis not present

## 2015-05-19 NOTE — Progress Notes (Signed)
Patient had elevated iron levels on lab check at PCP.

## 2015-05-19 NOTE — Patient Instructions (Signed)
Nice to meet you. We will refer you to the wound center.  We will have you return for fasting lab work. If you develop fevers, pain, increased swelling, redness, drainage of pus, or any new or changing symptoms please seek medical attention.

## 2015-05-19 NOTE — Assessment & Plan Note (Signed)
Wound with overlying eschar and mild serosanguineous drainage, no signs of infection. Systemically patient appears well. Vital signs are stable. We will have her referred to the wound care clinic for further management and evaluation. Discussed dressing with bandage with minimal amount of Neosporin on it. She is given return precautions.

## 2015-05-19 NOTE — Assessment & Plan Note (Signed)
Suspect this is related to venous insufficiency. No symptoms of CHF. Could be related to anemia as she has a history of this. She's due to have blood work done soon. We will follow-up on the blood work that she has potentially today with the hematologist and then determine what else needs to be drawn in the next several days.

## 2015-05-19 NOTE — Progress Notes (Signed)
Patient ID: [name], female   DOB: [birth year], 82 y.o.   MRN: [id number]  Tommi Rumps, MD Phone: [phone]  [name] is a 82 y.o. female who presents today for same-day visit.  Right lower extremity wound: Patient notes some time ago, she is unable to give a specific time frame, she hit her right lateral lower leg on a chair and had a bruise. About 5-6 days ago she notes the bruise opened up and started draining serosanguineous material. There is no pain. There have been no fevers. There is no warmth to the area. There is no purulent drainage. There is no spreading redness. She does have chronic lower extremity edema likely related to venous stasis changes.  Lower extremity edema: Patient notes this is been around for at least the last year. Improves with elevation of her legs. No orthopnea or PND. No chest pain or shortness of breath. She does have a history of anemia. No history of kidney or liver disease. No history of thyroid dysfunction.  PMH: nonsmoker.   ROS the history of present illness  Objective  Physical Exam Filed Vitals:   05/19/15 1026  BP: 116/70  Pulse: 64  Temp: 98.3 F (36.8 C)    BP Readings from Last 3 Encounters:  05/19/15 128/69  05/19/15 116/70  04/14/15 130/80   Wt Readings from Last 3 Encounters:  05/19/15 143 lb 6.4 oz (65.046 kg)  04/14/15 145 lb (65.772 kg)  03/31/15 142 lb 12.8 oz (64.774 kg)    Physical Exam  Constitutional: She is well-developed, well-nourished, and in no distress.  HENT:  Head: Normocephalic and atraumatic.  Cardiovascular: Normal rate and regular rhythm.   1+ pitting edema bilateral lower extremities  Pulmonary/Chest: Effort normal and breath sounds normal.  Skin: Skin is warm and dry. She is not diaphoretic.  Right lower extremity lateral portion proximal to the ankle with about 5-6 cm diameter wound with partial eschar overlying it, serosanguineous drainage noted, no purulent drainage, minimal  surrounding erythema, no tenderness, no fluctuance, no induration, smaller dime size lesion distal to this with overlying eschar     Assessment/Plan: Please see individual problem list.  Leg wound, right Wound with overlying eschar and mild serosanguineous drainage, no signs of infection. Systemically patient appears well. Vital signs are stable. We will have her referred to the wound care clinic for further management and evaluation. Discussed dressing with bandage with minimal amount of Neosporin on it. She is given return precautions.  Bilateral lower extremity edema Suspect this is related to venous insufficiency. No symptoms of CHF. Could be related to anemia as she has a history of this. She's due to have blood work done soon. We will follow-up on the blood work that she has potentially today with the hematologist and then determine what else needs to be drawn in the next several days.    Orders Placed This Encounter  Procedures  . AMB referral to wound care center    Referral Priority:  Routine    Referral Type:  Consultation    Number of Visits Requested:  [place], MD [place]

## 2015-05-19 NOTE — Progress Notes (Signed)
Pre visit review using our clinic review tool, if applicable. No additional management support is needed unless otherwise documented below in the visit note. 

## 2015-05-21 ENCOUNTER — Encounter: Payer: Commercial Managed Care - HMO | Attending: Surgery | Admitting: Surgery

## 2015-05-21 ENCOUNTER — Other Ambulatory Visit (INDEPENDENT_AMBULATORY_CARE_PROVIDER_SITE_OTHER): Payer: Commercial Managed Care - HMO

## 2015-05-21 DIAGNOSIS — I890 Lymphedema, not elsewhere classified: Secondary | ICD-10-CM | POA: Insufficient documentation

## 2015-05-21 DIAGNOSIS — D649 Anemia, unspecified: Secondary | ICD-10-CM | POA: Diagnosis not present

## 2015-05-21 DIAGNOSIS — I10 Essential (primary) hypertension: Secondary | ICD-10-CM | POA: Insufficient documentation

## 2015-05-21 DIAGNOSIS — E7800 Pure hypercholesterolemia, unspecified: Secondary | ICD-10-CM | POA: Diagnosis not present

## 2015-05-21 DIAGNOSIS — M549 Dorsalgia, unspecified: Secondary | ICD-10-CM | POA: Insufficient documentation

## 2015-05-21 DIAGNOSIS — K219 Gastro-esophageal reflux disease without esophagitis: Secondary | ICD-10-CM | POA: Diagnosis not present

## 2015-05-21 DIAGNOSIS — Z85038 Personal history of other malignant neoplasm of large intestine: Secondary | ICD-10-CM | POA: Diagnosis not present

## 2015-05-21 DIAGNOSIS — M1990 Unspecified osteoarthritis, unspecified site: Secondary | ICD-10-CM | POA: Insufficient documentation

## 2015-05-21 DIAGNOSIS — L97212 Non-pressure chronic ulcer of right calf with fat layer exposed: Secondary | ICD-10-CM | POA: Diagnosis not present

## 2015-05-21 DIAGNOSIS — G8929 Other chronic pain: Secondary | ICD-10-CM | POA: Insufficient documentation

## 2015-05-21 DIAGNOSIS — S81801A Unspecified open wound, right lower leg, initial encounter: Secondary | ICD-10-CM | POA: Diagnosis not present

## 2015-05-21 LAB — CBC WITH DIFFERENTIAL/PLATELET
Basophils Absolute: 0.1 10*3/uL (ref 0.0–0.1)
Basophils Relative: 0.6 % (ref 0.0–3.0)
EOS PCT: 3.2 % (ref 0.0–5.0)
Eosinophils Absolute: 0.3 10*3/uL (ref 0.0–0.7)
HEMATOCRIT: 37.7 % (ref 36.0–46.0)
HEMOGLOBIN: 12.5 g/dL (ref 12.0–15.0)
Lymphocytes Relative: 19.5 % (ref 12.0–46.0)
Lymphs Abs: 1.6 10*3/uL (ref 0.7–4.0)
MCHC: 33.2 g/dL (ref 30.0–36.0)
MCV: 97.7 fl (ref 78.0–100.0)
MONOS PCT: 6.7 % (ref 3.0–12.0)
Monocytes Absolute: 0.5 10*3/uL (ref 0.1–1.0)
Neutro Abs: 5.7 10*3/uL (ref 1.4–7.7)
Neutrophils Relative %: 70 % (ref 43.0–77.0)
Platelets: 264 10*3/uL (ref 150.0–400.0)
RBC: 3.86 Mil/uL — AB (ref 3.87–5.11)
RDW: 14.2 % (ref 11.5–15.5)
WBC: 8.1 10*3/uL (ref 4.0–10.5)

## 2015-05-21 LAB — BASIC METABOLIC PANEL
BUN: 27 mg/dL — ABNORMAL HIGH (ref 6–23)
CHLORIDE: 100 meq/L (ref 96–112)
CO2: 31 mEq/L (ref 19–32)
CREATININE: 1.04 mg/dL (ref 0.40–1.20)
Calcium: 9.7 mg/dL (ref 8.4–10.5)
GFR: 53.62 mL/min — ABNORMAL LOW (ref >60.00)
Glucose, Bld: 82 mg/dL (ref 70–99)
Potassium: 4.1 mEq/L (ref 3.5–5.1)
SODIUM: 140 meq/L (ref 135–145)

## 2015-05-21 LAB — LIPID PANEL
CHOL/HDL RATIO: 2
Cholesterol: 170 mg/dL (ref 0–200)
HDL: 71.4 mg/dL (ref >39.00)
LDL CALC: 73 mg/dL (ref 0–99)
NonHDL: 98.78
TRIGLYCERIDES: 127 mg/dL (ref 0.0–149.0)
VLDL: 25.4 mg/dL (ref 0.0–40.0)

## 2015-05-21 LAB — HEPATIC FUNCTION PANEL
ALBUMIN: 3.9 g/dL (ref 3.5–5.2)
ALK PHOS: 45 U/L (ref 39–117)
ALT: 13 U/L (ref 0–35)
AST: 16 U/L (ref 0–37)
Bilirubin, Direct: 0.2 mg/dL (ref 0.0–0.3)
TOTAL PROTEIN: 6.2 g/dL (ref 6.0–8.3)
Total Bilirubin: 1.4 mg/dL — ABNORMAL HIGH (ref 0.2–1.2)

## 2015-05-21 LAB — FERRITIN: Ferritin: 613.5 ng/mL — ABNORMAL HIGH (ref 10.0–291.0)

## 2015-05-21 LAB — IBC PANEL
Iron: 174 ug/dL — ABNORMAL HIGH (ref 42–145)
Saturation Ratios: 51.1 % — ABNORMAL HIGH (ref 20.0–50.0)
Transferrin: 243 mg/dL (ref 212.0–360.0)

## 2015-05-21 LAB — TSH: TSH: 2.31 u[IU]/mL (ref 0.35–4.50)

## 2015-05-21 NOTE — Addendum Note (Signed)
Addended by: Karlene Einstein D on: 05/21/2015 11:02 AM   Modules accepted: Orders

## 2015-05-22 ENCOUNTER — Encounter: Payer: Self-pay | Admitting: *Deleted

## 2015-05-25 NOTE — Progress Notes (Signed)
[facility]  [phone] [phone]  ID: [name] OB: 1928-05-12  MR#: [id number]  [id number]  Patient Care Team: [name], MD as PCP - General (Internal Medicine)  CHIEF COMPLAINT:  Chief Complaint  Patient presents with  . New Evaluation    elevated iron    INTERVAL HISTORY: Patient is an 82 year old female who was found to have elevated iron stores and routine blood work. 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 Patient has a history of colon cancer approximately 20-25 years ago with "a lot of bleeding", but no other significant past medical history. She currently feels well and is asymptomatic. She has no neurologic complaints. She denies any recent fevers or illnesses. She has a good appetite and denies weight loss. She denies any chest pain or shortness of breath. She denies any nausea, vomiting, constipation, or diarrhea. She has no melena or hematochezia. She has no urinary complaints. Patient feels at her baseline and offers no specific complaints today.  REVIEW OF SYSTEMS:   Review of Systems  Constitutional: Positive for weight loss. Negative for fever and malaise/fatigue.  Respiratory: Negative.  Negative for shortness of breath.   Cardiovascular: Negative.  Negative for chest pain.  Gastrointestinal: Negative.  Negative for blood in stool and melena.  Musculoskeletal: Negative.   Neurological: Negative.  Negative for weakness.  Psychiatric/Behavioral: Negative.     As per HPI. Otherwise, a complete review of systems is negatve.  PAST MEDICAL HISTORY: Past Medical History  Diagnosis Date  . Bowel obstruction (HCC)     s/p adhesion resection  . Recurrent sinus infections   . Vertigo   . Barrett's esophagus   . Hiatal hernia   . Hypercholesteremia   . Hypertension   . Chronic back pain   . Peripheral neuropathy ([place])   . Colon cancer ([place]) 1988 and 1989   adenocarcinoma, s/p resection x  2 and chemo  . S/P chemotherapy, time since greater than 12 weeks     colon cancer    PAST SURGICAL HISTORY: Past Surgical History  Procedure Laterality Date  . Colon resection      x2.  s/p colon cancer  . Adhesions resected      bowel obstruction  . Cholecystectomy    . Abdominal hysterectomy  1982  . Appendectomy    . Excisional hemorrhoidectomy      with tubal ligation  . Breast biopsy Left     negative 06/14/1985    FAMILY HISTORY Family History  Problem Relation Age of Onset  . Breast cancer Mother   . Stroke Father   . Hypertension Father   . Diabetes Father   . Asthma Mother   . Ovarian cancer Sister     x2  . Prostate cancer Brother   . Hypertension Brother     x3  . Hypercholesterolemia Brother     x3  . Diabetes Brother        ADVANCED DIRECTIVES:    HEALTH MAINTENANCE: Social History  Substance Use Topics  . Smoking status: Never Smoker   . Smokeless tobacco: Never Used  . Alcohol Use: No     Colonoscopy:  PAP:  Bone density:  Lipid panel:  No Known Allergies  Current Outpatient Prescriptions  Medication Sig Dispense Refill  . Calcium Carbonate-Vitamin D (CALCIUM 600 + D PO) Take 600 Units by mouth daily.     . Cholecalciferol (VITAMIN D3) 2000 UNITS TABS Take 2,000 Units by mouth  daily.    Marland Kitchen COLACE 100 MG capsule Take 1 capsule (100 mg total) by mouth 2 (two) times daily. 60 capsule 0  . estradiol (ESTRACE) 2 MG tablet TAKE 1 TABLET EVERY DAY 90 tablet 1  . fexofenadine (ALLEGRA) 60 MG tablet TAKE 1 TABLET EVERY DAY 90 tablet 3  . fish oil-omega-3 fatty acids 1000 MG capsule Take 2 g by mouth daily.    . fluticasone (FLOVENT HFA) 110 MCG/ACT inhaler Inhale 2 puffs into the lungs 2 (two) times daily. 1 Inhaler 1  . lactase (LACTAID) 3000 UNITS tablet Take 1 tablet by mouth as needed.    . lidocaine (LIDODERM) 5 % Place 1 patch onto the skin as needed. Remove & Discard patch within 12 hours or as directed by  MD    . lidocaine (XYLOCAINE) 4 % external solution Apply topically 2 (two) times daily as needed. 50 mL 1  . losartan-hydrochlorothiazide (HYZAAR) 50-12.5 MG tablet TAKE 1 TABLET BY MOUTH DAILY. 30 tablet 11  . meclizine (ANTIVERT) 25 MG tablet Take 25 mg by mouth 3 (three) times daily as needed.    . meloxicam (MOBIC) 7.5 MG tablet Take 7.5 mg by mouth daily.    . metoCLOPramide (REGLAN) 5 MG tablet Take 5 mg by mouth. Take one tablet three times a day    . metoprolol succinate (TOPROL-XL) 25 MG 24 hr tablet Take 1 tablet (25 mg total) by mouth daily. 90 tablet 3  . oxyCODONE (OXY IR/ROXICODONE) 5 MG immediate release tablet Take 5 mg by mouth 2 (two) times daily as needed.     . pantoprazole (PROTONIX) 40 MG tablet Take 40 mg by mouth 2 (two) times daily.    . simvastatin (ZOCOR) 10 MG tablet TAKE 1 TABLET AT BEDTIME 90 tablet 3  . solifenacin (VESICARE) 5 MG tablet Take 1 tablet (5 mg total) by mouth daily. 30 tablet 1  . traMADol-acetaminophen (ULTRACET) 37.5-325 MG per tablet Take 1 tablet by mouth every 8 (eight) hours as needed.    . VENTOLIN HFA 108 (90 BASE) MCG/ACT inhaler 1-2 INHALATIONS EVERY 4-6 HOURS AS NEEDED FOR WHEEZING. DISPENSE SPACER AS NEEDED.  0   No current facility-administered medications for this visit.    OBJECTIVE: Filed Vitals:   05/19/15 1430  BP: 128/69  Pulse: 62  Temp: 96.7 F (35.9 C)  Resp: 16     There is no weight on file to calculate BMI.    ECOG FS:0 - Asymptomatic  General: Well-developed, well-nourished, no acute distress. Eyes: Pink conjunctiva, anicteric sclera. HEENT: Normocephalic, moist mucous membranes, clear oropharnyx. Lungs: Clear to auscultation bilaterally. Heart: Regular rate and rhythm. No rubs, murmurs, or gallops. Abdomen: Soft, nontender, nondistended. No organomegaly noted, normoactive bowel sounds. Musculoskeletal: No edema, cyanosis, or clubbing. Neuro: Alert, answering all questions appropriately. Cranial nerves grossly  intact. Skin: No rashes or petechiae noted. Psych: Normal affect. Lymphatics: No cervical, calvicular, axillary or inguinal LAD.   LAB RESULTS:  Lab Results  Component Value Date   NA 140 05/21/2015   K 4.1 05/21/2015   CL 100 05/21/2015   CO2 31 05/21/2015   GLUCOSE 82 05/21/2015   BUN 27* 05/21/2015   CREATININE 1.04 05/21/2015   CALCIUM 9.7 05/21/2015   PROT 6.2 05/21/2015   ALBUMIN 3.9 05/21/2015   AST 16 05/21/2015   ALT 13 05/21/2015   ALKPHOS 45 05/21/2015   BILITOT 1.4* 05/21/2015   GFRNONAA 57* 07/10/2014   GFRAA >60 07/10/2014    Lab Results  Component Value Date   WBC 8.1 05/21/2015   NEUTROABS 5.7 05/21/2015   HGB 12.5 05/21/2015   HCT 37.7 05/21/2015   MCV 97.7 05/21/2015   PLT 264.0 05/21/2015   Lab Results  Component Value Date   IRON 174* 05/21/2015   IRONPCTSAT 51.1* 05/21/2015    Lab Results  Component Value Date   FERRITIN 613.5* 05/21/2015     STUDIES: US Carotid Bilateral  05/12/2015  CLINICAL DATA:  Dizziness for 4 weeks EXAM: BILATERAL CAROTID DUPLEX ULTRASOUND TECHNIQUE: Pearline Cables scale imaging, color Doppler and duplex ultrasound were performed of bilateral carotid and vertebral arteries in the neck. COMPARISON:  None. FINDINGS: Criteria: Quantification of carotid stenosis is based on velocity parameters that correlate the residual internal carotid diameter with NASCET-based stenosis levels, using the diameter of the distal internal carotid lumen as the denominator for stenosis measurement. The following velocity measurements were obtained: RIGHT ICA:  80 cm/sec CCA:  95 cm/sec SYSTOLIC ICA/CCA RATIO:  0.8 DIASTOLIC ICA/CCA RATIO:  0.5 ECA:  74 cm/sec LEFT ICA:  80 cm/sec CCA:  89 cm/sec SYSTOLIC ICA/CCA RATIO:  0.9 DIASTOLIC ICA/CCA RATIO:  1.8 ECA:  89 cm/sec RIGHT CAROTID ARTERY: Little if any plaque in the bulb. Moderate tortuosity. Low resistance internal carotid Doppler pattern. RIGHT VERTEBRAL ARTERY:  Antegrade with a normal Doppler  pattern. LEFT CAROTID ARTERY: Mild calcified plaque in the bulb. Low resistance internal carotid Doppler pattern. LEFT VERTEBRAL ARTERY: Antegrade with a low resistance Doppler pattern. IMPRESSION: Less than 50% stenosis in the right and left internal carotid arteries. Electronically Signed   By: Marybelle Killings M.D.   On: 05/12/2015 16:46    ASSESSMENT: Hemochromatosis, homozygous C282Y mutation.  PLAN:    1. Hemachromatosis: Given patient's significantly elevated ferritin level, she will benefit from phlebotomy.  She is likely presenting with elevated iron stores at such a late age given her history of colon cancer 20-25 years ago. Return to clinic in 1 week for 300 mL phlebotomy. Patient will then return to clinic every 4 weeks thereafter for laboratory work and phlebotomy. Goal ferritin will be less than 100. Return to clinic in 6 months for repeat laboratory work and further evaluation.  Approximately 35 minutes was spent in discussion of which greater than 50% was consultation.  Patient expressed understanding and was in agreement with this plan. She also understands that She can call clinic at any time with any questions, concerns, or complaints.    Lloyd Huger, MD   05/25/2015 8:31 AM

## 2015-05-26 ENCOUNTER — Inpatient Hospital Stay: Payer: Commercial Managed Care - HMO

## 2015-05-26 NOTE — Progress Notes (Signed)
JAZLENE, BARES ([id number]) Visit Report for 05/21/2015 Allergy List Details Patient Name: [name], [name] Date of Service: 05/21/2015 2:15 PM Medical Record Number: [id number] Patient Account Number: [id number] Date of Birth/Sex: 03/11/1928 (82 y.o. Female) Treating RN: Carolyne Fiscal, Debi Primary Care Physician: [name] Other Clinician: Referring Physician: [name] Treating Physician/Extender: Frann Rider in Treatment: 0 Electronic Signature(s) Signed: 05/26/2015 4:14:54 PM By: Alric Quan Entered By: Alric Quan on 05/21/2015 13:59:09 [name] ([id number]) -------------------------------------------------------------------------------- [place] Details Patient Name: [name] Date of Service: 05/21/2015 2:15 PM Medical Record Number: [id number] Patient Account Number: [id number] Date of Birth/Sex: [birth year] (82 y.o. Female) Treating RN: Carolyne Fiscal, Debi Primary Care Physician: [name] Other Clinician: Referring Physician: [name] Treating Physician/Extender: Frann Rider in Treatment: 0 Visit Information Patient Arrived: Walker Arrival Time: 13:55 Accompanied By: husband Transfer Assistance: EasyPivot Patient Lift Patient Identification Verified: Yes Secondary Verification Process Yes Completed: Patient Requires Transmission- No Based Precautions: Patient Has Alerts: No Electronic Signature(s) Signed: 05/26/2015 4:14:54 PM By: Alric Quan Entered By: Alric Quan on 05/21/2015 13:56:21 [name] ([id number]) -------------------------------------------------------------------------------- Clinic Level of Care Assessment Details Patient Name: [name] Date of Service: 05/21/2015 2:15 PM Medical Record Number: [id number] Patient Account Number: [id number] Date of Birth/Sex: 05-Nov-1928 (82 y.o. Female) Treating RN: Carolyne Fiscal, Debi Primary Care Physician: [name] Other  Clinician: Referring Physician: [name] Treating Physician/Extender: Frann Rider in Treatment: 0 Clinic Level of Care Assessment Items TOOL 1 Quantity Score X - Use when EandM and Procedure is performed on INITIAL visit 1 0 ASSESSMENTS - Nursing Assessment / Reassessment X - General Physical Exam (combine w/ comprehensive assessment (listed just 1 20 below) when performed on new pt. evals) X - Comprehensive Assessment (HX, ROS, Risk Assessments, Wounds Hx, etc.) 1 25 ASSESSMENTS - Wound and Skin Assessment / Reassessment _0  - Dermatologic / Skin Assessment (not related to wound area) 0 ASSESSMENTS - Ostomy and/or Continence Assessment and Care _1  - Incontinence Assessment and Management 0 _2  - Ostomy Care Assessment and Management (repouching, etc.) 0 PROCESS - Coordination of Care _3  - Simple Patient / Family Education for ongoing care 0 X - Complex (extensive) Patient / Family Education for ongoing care 1 20 _4  - Staff obtains Programmer, systems, Records, Test Results / Process Orders 0 _5  - Staff telephones HHA, Nursing Homes / Clarify orders / etc 0 _6  - Routine Transfer to another Facility (non-emergent condition) 0 _7  - Routine Hospital Admission (non-emergent condition) 0 X - New Admissions / Biomedical engineer / Ordering NPWT, Apligraf, etc. 1 15 _8  - Emergency Hospital Admission (emergent condition) 0 PROCESS - Special Needs _9  - Pediatric / Minor Patient Management 0 _10  - Isolation Patient Management 0 Rago, Ritha N. ([id number]) _11  - Hearing / Language / Visual special needs 0 _12  - Assessment of Community assistance (transportation, D/C planning, etc.) 0 _13  - Additional assistance / Altered mentation 0 _14  - Support Surface(s) Assessment (bed, cushion, seat, etc.) 0 INTERVENTIONS - Miscellaneous _15  - External ear exam 0 _16  - Patient Transfer (multiple staff / Civil Service fast streamer / Similar devices) 0 _17  - Simple Staple / Suture removal (25 or less) 0 _18  - Complex  Staple / Suture removal (26 or more) 0 _19  - Hypo/Hyperglycemic Management (do not check if billed separately) 0 X - Ankle / Brachial Index (ABI) - do not check if billed separately 1 15 Has the patient been seen at the hospital within the last three years: Yes Total Score: 95 Level Of Care:  New/Established - Level 3 Electronic Signature(s) Signed: 05/26/2015 4:14:54 PM By: Alric Quan Entered By: Alric Quan on 05/21/2015 15:30:16 [name] ([id number]) -------------------------------------------------------------------------------- Encounter Discharge Information Details Patient Name: [name] Date of Service: 05/21/2015 2:15 PM Medical Record Number: [id number] Patient Account Number: [id number] Date of Birth/Sex: 1928-03-20 (82 y.o. Female) Treating RN: Carolyne Fiscal, Debi Primary Care Physician: [name] Other Clinician: Referring Physician: [name] Treating Physician/Extender: Frann Rider in Treatment: 0 Encounter Discharge Information Items Discharge Pain Level: 0 Discharge Condition: Stable Ambulatory Status: Walker Discharge Destination: Home Transportation: Private Auto Accompanied By: husband Schedule Follow-up Appointment: Yes Medication Reconciliation completed No and provided to Patient/Care Kenna Seward: Provided on Clinical Summary of Care: 05/21/2015 Form Type Recipient Paper Patient [facility] Electronic Signature(s) Signed: 05/21/2015 3:23:47 PM By: Ruthine Dose Entered By: Ruthine Dose on 05/21/2015 15:23:47 [name] ([id number]) -------------------------------------------------------------------------------- Lower Extremity Assessment Details Patient Name: [name] Date of Service: 05/21/2015 2:15 PM Medical Record Number: [id number] Patient Account Number: [id number] Date of Birth/Sex: 04/18/1928 (82 y.o. Female) Treating RN: Carolyne Fiscal, Debi Primary Care Physician: [name] Other Clinician: Referring  Physician: [name] Treating Physician/Extender: Frann Rider in Treatment: 0 Edema Assessment Assessed: [Left: No] [Right: No] Edema: [Left: Yes] [Right: Yes] Calf Left: Right: Point of Measurement: 34 cm From Medial Instep 31.5 cm 30 cm Ankle Left: Right: Point of Measurement: 10 cm From Medial Instep 23 cm 22 cm Vascular Assessment Pulses: Posterior Tibial Dorsalis Pedis Palpable: [Left:Yes] [Right:Yes] Doppler: [Left:Monophasic] [Right:Multiphasic] Extremity colors, hair growth, and conditions: Extremity Color: [Left:Mottled] [Right:Mottled] Hair Growth on Extremity: [Left:Yes] [Right:Yes] Temperature of Extremity: [Left:Cold] [Right:Cold] Capillary Refill: [Left:< 3 seconds] [Right:> 3 seconds] Blood Pressure: Brachial: [Left:133] [Right:133] Dorsalis Pedis: 160 [Left:Dorsalis Pedis: 160] Ankle: Posterior Tibial: 140 [Left:Posterior Tibial: 160 1.20] [Right:1.20] Toe Nail Assessment Left: Right: Thick: No No Discolored: No No Deformed: No No Improper Length and Hygiene: No No Electronic Signature(s) KATIE, FARAONE ([id number]) Signed: 05/26/2015 4:14:54 PM By: Alric Quan Entered By: Alric Quan on 05/21/2015 15:31:56 [name] ([id number]) -------------------------------------------------------------------------------- Multi Wound Chart Details Patient Name: [name] Date of Service: 05/21/2015 2:15 PM Medical Record Number: [id number] Patient Account Number: [id number] Date of Birth/Sex: 04-29-1928 (82 y.o. Female) Treating RN: Carolyne Fiscal, Debi Primary Care Physician: [name] Other Clinician: Referring Physician: [name] Treating Physician/Extender: Frann Rider in Treatment: 0 Vital Signs Height(in): 61 Pulse(bpm): 62 Weight(lbs): 142 Blood Pressure 133/52 (mmHg): Body Mass Index(BMI): 27 Temperature(F): 98.0 Respiratory Rate 20 (breaths/min): Photos: [1:No Photos] [2:No Photos]  [N/A:N/A] Wound Location: [1:Right Lower Leg - Lateral, Proximal] [2:Right Lower Leg - Lateral, Distal] [N/A:N/A] Wounding Event: [1:Trauma] [2:Trauma] [N/A:N/A] Primary Etiology: [1:Trauma, Other] [2:Trauma, Other] [N/A:N/A] Comorbid History: [1:Anemia, Hypertension, Osteoarthritis, Received Chemotherapy] [2:Anemia, Hypertension, Osteoarthritis, Received Chemotherapy] [N/A:N/A] Date Acquired: [1:04/20/2015] [2:04/20/2015] [N/A:N/A] Weeks of Treatment: [1:0] [2:0] [N/A:N/A] Wound Status: [1:Open] [2:Open] [N/A:N/A] Measurements L x W x D 3.2x1.6x0.1 [2:1x0.4x0.1] [N/A:N/A] (cm) Area (cm) : [1:4.021] [2:0.314] [N/A:N/A] Volume (cm) : [1:0.402] [2:0.031] [N/A:N/A] Classification: [1:Partial Thickness] [2:Partial Thickness] [N/A:N/A] Exudate Amount: [1:Large] [2:Large] [N/A:N/A] Exudate Type: [1:Serosanguineous] [2:Serosanguineous] [N/A:N/A] Exudate Color: [1:red, brown] [2:red, brown] [N/A:N/A] Wound Margin: [1:Flat and Intact] [2:Flat and Intact] [N/A:N/A] Granulation Amount: [1:None Present (0%)] [2:None Present (0%)] [N/A:N/A] Necrotic Amount: [1:Large (67-100%)] [2:Large (67-100%)] [N/A:N/A] Necrotic Tissue: [1:Eschar] [2:Eschar] [N/A:N/A] Exposed Structures: [1:Fascia: No Fat: No Tendon: No Muscle: No Joint: No Bone: No] [2:Fascia: No Fat: No Tendon: No Muscle: No Joint: No Bone: No] [N/A:N/A] Limited to Skin Limited to Skin Breakdown Breakdown Epithelialization:  None None N/A Periwound Skin Texture: Edema: Yes Edema: Yes N/A Periwound Skin Moist: Yes No Abnormalities Noted N/A Moisture: Periwound Skin Color: No Abnormalities Noted No Abnormalities Noted N/A Temperature: No Abnormality N/A N/A Tenderness on No No N/A Palpation: Wound Preparation: Ulcer Cleansing: Ulcer Cleansing: N/A Rinsed/Irrigated with Rinsed/Irrigated with Saline Saline Topical Anesthetic Topical Anesthetic Applied: Other: lidocaine Applied: Other: lidocaine 4% 4% Treatment Notes Electronic  Signature(s) Signed: 05/26/2015 4:14:54 PM By: Alric Quan Entered By: Alric Quan on 05/21/2015 14:34:21 [name] ([id number]) -------------------------------------------------------------------------------- [place] Details Patient Name: [name] Date of Service: 05/21/2015 2:15 PM Medical Record Number: [id number] Patient Account Number: [id number] Date of Birth/Sex: Dec 16, 1928 (82 y.o. Female) Treating RN: Carolyne Fiscal, Debi Primary Care Physician: [name] Other Clinician: Referring Physician: [name] Treating Physician/Extender: Frann Rider in Treatment: 0 Active Inactive Abuse / Safety / Falls / Self Care Management Nursing Diagnoses: Potential for falls Goals: Patient will remain injury free Date Initiated: 05/21/2015 Goal Status: Active Interventions: Assess fall risk on admission and as needed Notes: Nutrition Nursing Diagnoses: Imbalanced nutrition Goals: Patient/caregiver agrees to and verbalizes understanding of need to use nutritional supplements and/or vitamins as prescribed Date Initiated: 05/21/2015 Goal Status: Active Interventions: Assess patient nutrition upon admission and as needed per policy Notes: Orientation to the Wound Care Program Nursing Diagnoses: Knowledge deficit related to the wound healing center program Goals: Patient/caregiver will verbalize understanding of the [place], Marnette N. ([id number]) Date Initiated: 05/21/2015 Goal Status: Active Interventions: Provide education on orientation to the wound center Notes: Pain, Acute or Chronic Nursing Diagnoses: Pain, acute or chronic: actual or potential Potential alteration in comfort, pain Goals: Patient/caregiver will verbalize adequate pain control between visits Date Initiated: 05/21/2015 Goal Status: Active Patient/caregiver will verbalize comfort level met Date Initiated: 05/21/2015 Goal Status:  Active Interventions: Complete pain assessment as per visit requirements Notes: Wound/Skin Impairment Nursing Diagnoses: Impaired tissue integrity Goals: Ulcer/skin breakdown will have a volume reduction of 30% by week 4 Date Initiated: 05/21/2015 Goal Status: Active Ulcer/skin breakdown will have a volume reduction of 50% by week 8 Date Initiated: 05/21/2015 Goal Status: Active Ulcer/skin breakdown will have a volume reduction of 80% by week 12 Date Initiated: 05/21/2015 Goal Status: Active Interventions: Assess patient/caregiver ability to obtain necessary supplies Assess ulceration(s) every visit SERENNA, DEROY ([id number]) Notes: Electronic Signature(s) Signed: 05/26/2015 4:14:54 PM By: Alric Quan Entered By: Alric Quan on 05/21/2015 14:34:12 [name] ([id number]) -------------------------------------------------------------------------------- Pain Assessment Details Patient Name: [name] Date of Service: 05/21/2015 2:15 PM Medical Record Number: [id number] Patient Account Number: [id number] Date of Birth/Sex: Nov 15, 1928 (82 y.o. Female) Treating RN: Carolyne Fiscal, Debi Primary Care Physician: [name] Other Clinician: Referring Physician: [name] Treating Physician/Extender: Frann Rider in Treatment: 0 Active Problems Location of Pain Severity and Description of Pain Patient Has Paino No Site Locations Pain Management and Medication Current Pain Management: Electronic Signature(s) Signed: 05/26/2015 4:14:54 PM By: Alric Quan Entered By: Alric Quan on 05/21/2015 13:56:30 [name] ([id number]) -------------------------------------------------------------------------------- Patient/Caregiver Education Details Patient Name: [name] Date of Service: 05/21/2015 2:15 PM Medical Record Number: [id number] Patient Account Number: [id number] Date of Birth/Gender: 02-24-1928 (82 y.o. Female) Treating RN:  Carolyne Fiscal, Debi Primary Care Physician: [name] Other Clinician: Referring Physician: [name] Treating Physician/Extender: Frann Rider in Treatment: 0 Education Assessment Education Provided To: Patient Education Topics Provided Wound/Skin Impairment: Handouts: Other: change dressing as ordered Methods: Demonstration, Explain/Verbal Responses: State content correctly Electronic Signature(s) Signed:  05/26/2015 4:14:54 PM By: Alejandro Mulling Entered By: Alejandro Mulling on 05/21/2015 15:11:40 Sabino Gasser ([id number]) -------------------------------------------------------------------------------- Wound Assessment Details Patient Name: Sabino Gasser Date of Service: 05/21/2015 2:15 PM Medical Record Number: [id number] Patient Account Number: [id number] Date of Birth/Sex: Jul 01, 1928 (82 y.o. Female) Treating RN: Ashok Cordia, Debi Primary Care Physician: Dale Barry Other Clinician: Referring Physician: Dale Alpha Treating Physician/Extender: Rudene Re in Treatment: 0 Wound Status Wound Number: 1 Primary Trauma, Other Etiology: Wound Location: Right, Proximal, Lateral Lower Leg Wound Open Status: Wounding Event: Trauma Comorbid Anemia, Hypertension, Osteoarthritis, Date Acquired: 04/20/2015 History: Received Chemotherapy Weeks Of Treatment: 0 Clustered Wound: No Photos Photo Uploaded By: Alejandro Mulling on 05/21/2015 15:40:20 Wound Measurements Length: (cm) 4.2 Width: (cm) 1.6 Depth: (cm) 0.1 Area: (cm) 5.278 Volume: (cm) 0.528 % Reduction in Area: -31.3% % Reduction in Volume: -31.3% Epithelialization: None Tunneling: No Undermining: No Wound Description Classification: Partial Thickness Wound Margin: Flat and Intact Exudate Amount: Large Exudate Type: Serosanguineous Exudate Color: red, brown Foul Odor After Cleansing: No Wound Bed Granulation Amount: None Present (0%) Exposed Structure Necrotic Amount: Large  (67-100%) Fascia Exposed: No Necrotic Quality: Eschar Fat Layer Exposed: No Ben[place], Alexanderia N. ([id number]) Tendon Exposed: No Muscle Exposed: No Joint Exposed: No Bone Exposed: No Limited to Skin Breakdown Periwound Skin Texture Texture Color No Abnormalities Noted: No No Abnormalities Noted: No Localized Edema: Yes Temperature / Pain Moisture Temperature: No Abnormality No Abnormalities Noted: No Moist: Yes Wound Preparation Ulcer Cleansing: Rinsed/Irrigated with Saline Topical Anesthetic Applied: Other: lidocaine 4%, Treatment Notes Wound #1 (Right, Proximal, Lateral Lower Leg) 1. Cleansed with: Clean wound with Normal Saline 2. Anesthetic Topical Lidocaine 4% cream to wound bed prior to debridement 3. Peri-wound Care: Skin Prep 4. Dressing Applied: Aquacel Ag 5. Secondary Dressing Applied Bordered Foam Dressing Electronic Signature(s) Signed: 05/26/2015 4:14:54 PM By: Alejandro Mulling Entered By: Alejandro Mulling on 05/21/2015 15:32:28 Sabino Gasser ([id number]) -------------------------------------------------------------------------------- Wound Assessment Details Patient Name: Sabino Gasser Date of Service: 05/21/2015 2:15 PM Medical Record Number: [id number] Patient Account Number: [id number] Date of Birth/Sex: 04/18/1928 (82 y.o. Female) Treating RN: Ashok Cordia, Debi Primary Care Physician: Dale Felton Other Clinician: Referring Physician: Dale [place] Treating Physician/Extender: Rudene Re in Treatment: 0 Wound Status Wound Number: 2 Primary Trauma, Other Etiology: Wound Location: Right Lower Leg - Lateral, Distal Wound Open Status: Wounding Event: Trauma Comorbid Anemia, Hypertension, Osteoarthritis, Date Acquired: 04/20/2015 History: Received Chemotherapy Weeks Of Treatment: 0 Clustered Wound: No Photos Photo Uploaded By: Alejandro Mulling on 05/21/2015 15:40:34 Wound Measurements Length: (cm) 1 % Reduction in Width: (cm) 0.4 %  Reduction in Depth: (cm) 0.1 Epithelializati Area: (cm) 0.314 Tunneling: Volume: (cm) 0.031 Undermining: Area: 0% Volume: 0% on: None No No Wound Description Classification: Partial Thickness Wound Margin: Flat and Intact Exudate Amount: Large Exudate Type: Serosanguineous Exudate Color: red, brown Foul Odor After Cleansing: No Wound Bed Granulation Amount: None Present (0%) Exposed Structure Necrotic Amount: Large (67-100%) Fascia Exposed: No Necrotic Quality: Eschar Fat Layer Exposed: No [place], [place] N. ([id number]) Tendon Exposed: No Muscle Exposed: No Joint Exposed: No Bone Exposed: No Limited to Skin Breakdown Periwound Skin Texture Texture Color No Abnormalities Noted: No No Abnormalities Noted: No Localized Edema: Yes Moisture No Abnormalities Noted: No Wound Preparation Ulcer Cleansing: Rinsed/Irrigated with Saline Topical Anesthetic Applied: Other: lidocaine 4%, Treatment Notes Wound #2 (Right, Distal, Lateral Lower Leg) 1. Cleansed with: Clean wound with Normal Saline 2. Anesthetic Topical Lidocaine 4% cream to wound bed prior to debridement 3. Peri-wound Care: Skin  Prep 4. Dressing Applied: Aquacel Ag 5. Secondary Dressing Applied Bordered Foam Dressing Electronic Signature(s) Signed: 05/26/2015 4:14:54 PM By: Alric Quan Entered By: Alric Quan on 05/21/2015 15:32:41 [name] ([id number]) -------------------------------------------------------------------------------- [place] Details Patient Name: [name] Date of Service: 05/21/2015 2:15 PM Medical Record Number: [id number] Patient Account Number: [id number] Date of Birth/Sex: 06/27/1928 (82 y.o. Female) Treating RN: Carolyne Fiscal, Debi Primary Care Physician: [name] Other Clinician: Referring Physician: [name] Treating Physician/Extender: Frann Rider in Treatment: 0 Vital Signs Time Taken: 13:56 Temperature (F): 98.0 Height (in):  61 Pulse (bpm): 62 Source: Stated Respiratory Rate (breaths/min): 20 Weight (lbs): 142 Blood Pressure (mmHg): 133/52 Source: Stated Reference Range: 80 - 120 mg / dl Body Mass Index (BMI): 26.8 Electronic Signature(s) Signed: 05/26/2015 4:14:54 PM By: Alric Quan Entered By: Alric Quan on 05/21/2015 13:59:02

## 2015-05-26 NOTE — Progress Notes (Signed)
[name], [name] ([id number]) Visit Report for 05/21/2015 Chief Complaint Document Details Patient Name: [name], [name] Date of Service: 05/21/2015 2:15 PM Medical Record Number: [id number] Patient Account Number: [id number] Date of Birth/Sex: 22-Nov-1928 (82 y.o. Female) Treating RN: Carolyne Fiscal, Debi Primary Care Physician: [name] Other Clinician: Referring Physician: [name] Treating Physician/Extender: Frann Rider in Treatment: 0 Information Obtained from: Patient Chief Complaint Patient presents to the wound care center for a consult due non healing wound which she's had for about 2 months Electronic Signature(s) Signed: 05/21/2015 3:10:46 PM By: [name] MD, FACS Entered By: [name] on 05/21/2015 15:10:46 [name] ([id number]) -------------------------------------------------------------------------------- Debridement Details Patient Name: [name] Date of Service: 05/21/2015 2:15 PM Medical Record Number: [id number] Patient Account Number: [id number] Date of Birth/Sex: 06-11-1928 (82 y.o. Female) Treating RN: Carolyne Fiscal, Debi Primary Care Physician: [name] Other Clinician: Referring Physician: [name] Treating Physician/Extender: Frann Rider in Treatment: 0 Debridement Performed for Wound #2 Right,Distal,Lateral Lower Leg Assessment: Performed By: Physician [name], MD Debridement: Debridement Pre-procedure Yes Verification/Time Out Taken: Start Time: 15:01 Pain Control: Other : lidocaine 4% Level: Skin/Subcutaneous Tissue Total Area Debrided (L x 1 (cm) x 0.4 (cm) = 0.4 (cm) W): Tissue and other Viable, Non-Viable, Exudate, Fibrin/Slough, Subcutaneous material debrided: Instrument: Curette Bleeding: Minimum Hemostasis Achieved: Pressure End Time: 15:03 Procedural Pain: 0 Post Procedural Pain: 0 Response to Treatment: Procedure was tolerated well Post Debridement Measurements of Total  Wound Length: (cm) 1 Width: (cm) 0.4 Depth: (cm) 0.2 Volume: (cm) 0.063 Post Procedure Diagnosis Same as Pre-procedure Electronic Signature(s) Signed: 05/21/2015 3:10:12 PM By: [name] MD, FACS Signed: 05/26/2015 4:14:54 PM By: Alric Quan Entered By: [name] on 05/21/2015 15:10:12 [name] ([id number]) -------------------------------------------------------------------------------- Debridement Details Patient Name: [name] Date of Service: 05/21/2015 2:15 PM Medical Record Number: [id number] Patient Account Number: [id number] Date of Birth/Sex: [birth year] (82 y.o. Female) Treating RN: Carolyne Fiscal, Debi Primary Care Physician: [name] Other Clinician: Referring Physician: [name] Treating Physician/Extender: Frann Rider in Treatment: 0 Debridement Performed for Wound #1 Right,Proximal,Lateral Lower Leg Assessment: Performed By: Physician [name], MD Debridement: Debridement Pre-procedure Yes Verification/Time Out Taken: Start Time: 14:58 Pain Control: Other : lidocaine 4% Level: Skin/Subcutaneous Tissue Total Area Debrided (L x 4.2 (cm) x 1.6 (cm) = 6.72 (cm) W): Tissue and other Viable, Non-Viable, Exudate, Fibrin/Slough, Subcutaneous material debrided: Instrument: Curette Bleeding: Minimum Hemostasis Achieved: Pressure End Time: 15:01 Procedural Pain: 0 Post Procedural Pain: 0 Response to Treatment: Procedure was tolerated well Post Debridement Measurements of Total Wound Length: (cm) 4.2 Width: (cm) 1.6 Depth: (cm) 0.2 Volume: (cm) 1.056 Post Procedure Diagnosis Same as Pre-procedure Electronic Signature(s) Signed: 05/21/2015 4:29:21 PM By: [name] MD, FACS Signed: 05/26/2015 4:14:54 PM By: Alric Quan Previous Signature: 05/21/2015 3:10:04 PM Version By: [name] MD, FACS Entered By: Alric Quan on 05/21/2015 15:33:05 [name]  ([id number]) -------------------------------------------------------------------------------- HPI Details Patient Name: [name] Date of Service: 05/21/2015 2:15 PM Medical Record Number: [id number] Patient Account Number: [id number] Date of Birth/Sex: 1928/12/14 (82 y.o. Female) Treating RN: Carolyne Fiscal, Debi Primary Care Physician: [name] Other Clinician: Referring Physician: [name] Treating Physician/Extender: Frann Rider in Treatment: 0 History of Present Illness Location: right lateral calf area Quality: Patient reports No Pain. Severity: Patient states wound are getting worse. Duration: Patient has had the wound for > 2 months prior to seeking treatment at the wound center Context: The wound occurred when the patient injured her leg and bruised  it against a blunt object Modifying Factors: Consults to this date include:local care and no antibiotics Associated Signs and Symptoms: Patient reports having increase swelling. HPI Description: 82 year old patient who had a blunt injury to her right lower extremity about a few weeks ago now noticed that the wound had opened out and started draining fluid. She is known to have chronic lower extremity edema which has been there for several years. The PCP feels that the edema has not related to anemia or CHF. The patient's past medical history significant for adenocarcinoma of the colon status post resection, bowel obstruction with adhesions, essential hypertension, vertigo,chronic back pain, status post cholecystectomy, hysterectomy, appendectomy, colon surgery, bowel adhesion resection, hemorrhoid surgery. She has never been a smoker. Electronic Signature(s) Signed: 05/21/2015 3:12:41 PM By: [name] MD, FACS Previous Signature: 05/21/2015 2:12:39 PM Version By: [name] MD, FACS Entered By: [name] on 05/21/2015 15:12:41 [name]  ([id number]) -------------------------------------------------------------------------------- Physical Exam Details Patient Name: [name] Date of Service: 05/21/2015 2:15 PM Medical Record Number: [id number] Patient Account Number: [id number] Date of Birth/Sex: 05-Aug-1928 (82 y.o. Female) Treating RN: Carolyne Fiscal, Debi Primary Care Physician: [name] Other Clinician: Referring Physician: [name] Treating Physician/Extender: Frann Rider in Treatment: 0 Constitutional . Pulse regular. Respirations normal and unlabored. Afebrile. . Eyes Nonicteric. Reactive to light. Ears, Nose, Mouth, and Throat Lips, teeth, and gums WNL.Marland Kitchen Moist mucosa without lesions. Neck supple and nontender. No palpable supraclavicular or cervical adenopathy. Normal sized without goiter. Respiratory WNL. No retractions.. Cardiovascular Pedal Pulses WNL. ABI both right and left lower extremities is 1.2. she has stage I lymphedema both lower extremities. Gastrointestinal (GI) Abdomen without masses or tenderness.. No liver or spleen enlargement or tenderness.. Lymphatic No adneopathy. No adenopathy. No adenopathy. Musculoskeletal Adexa without tenderness or enlargement.. Digits and nails w/o clubbing, cyanosis, infection, petechiae, ischemia, or inflammatory conditions.. Integumentary (Hair, Skin) No suspicious lesions. No crepitus or fluctuance. No peri-wound warmth or erythema. No masses.Marland Kitchen Psychiatric Judgement and insight Intact.. No evidence of depression, anxiety, or agitation.. Notes the ulcerated areas have covered eschar and subcutaneous debris which I was able to sharply debride with a #3 curet. There was no bleeding. Electronic Signature(s) Signed: 05/21/2015 3:13:49 PM By: [name] MD, FACS Entered By: [name] on 05/21/2015 15:13:48 [name] ([id number]) -------------------------------------------------------------------------------- Physician Orders  Details Patient Name: [name] Date of Service: 05/21/2015 2:15 PM Medical Record Number: [id number] Patient Account Number: [id number] Date of Birth/Sex: 25-Sep-1928 (82 y.o. Female) Treating RN: Carolyne Fiscal, Debi Primary Care Physician: [name] Other Clinician: Referring Physician: [name] Treating Physician/Extender: Frann Rider in Treatment: 0 Verbal / Phone Orders: Yes Clinician: Pinkerton, Debi Read Back and Verified: Yes Diagnosis Coding Wound Cleansing Wound #1 Right,Proximal,Lateral Lower Leg o Clean wound with Normal Saline. o Cleanse wound with mild soap and water o May Shower, gently pat wound dry prior to applying new dressing. Wound #2 Right,Distal,Lateral Lower Leg o Clean wound with Normal Saline. o Cleanse wound with mild soap and water o May Shower, gently pat wound dry prior to applying new dressing. Anesthetic Wound #1 Right,Proximal,Lateral Lower Leg o Topical Lidocaine 4% cream applied to wound bed prior to debridement Wound #2 Right,Distal,Lateral Lower Leg o Topical Lidocaine 4% cream applied to wound bed prior to debridement Skin Barriers/Peri-Wound Care Wound #1 Right,Proximal,Lateral Lower Leg o Skin Prep Wound #2 Right,Distal,Lateral Lower Leg o Skin Prep Primary Wound Dressing Wound #1 Right,Proximal,Lateral Lower Leg o Aquacel Ag Wound #2 Right,Distal,Lateral Lower Leg o  Aquacel Ag Secondary Dressing Wound #1 Right,Proximal,Lateral Lower Leg o Boardered Foam Dressing [name], [name] ([id number]) Wound #2 Right,Distal,Lateral Lower Leg o Boardered Foam Dressing Dressing Change Frequency Wound #1 Right,Proximal,Lateral Lower Leg o Change dressing every other day. Wound #2 Right,Distal,Lateral Lower Leg o Change dressing every other day. Follow-up Appointments Wound #1 Right,Proximal,Lateral Lower Leg o Return Appointment in 1 week. Wound #2 Right,Distal,Lateral Lower Leg o  Return Appointment in 1 week. Edema Control Wound #1 Right,Proximal,Lateral Lower Leg o Elevate legs to the level of the heart and pump ankles as often as possible o Other: - get and wear compression stockings Wound #2 Right,Distal,Lateral Lower Leg o Elevate legs to the level of the heart and pump ankles as often as possible o Other: - get and wear compression stockings Additional Orders / Instructions Wound #1 Right,Proximal,Lateral Lower Leg o Increase protein intake. Wound #2 Right,Distal,Lateral Lower Leg o Increase protein intake. Electronic Signature(s) Signed: 05/21/2015 4:29:21 PM By: [name] MD, FACS Signed: 05/26/2015 4:14:54 PM By: Alric Quan Entered By: Alric Quan on 05/21/2015 15:08:28 [name] ([id number]) -------------------------------------------------------------------------------- Problem List Details Patient Name: [name] Date of Service: 05/21/2015 2:15 PM Medical Record Number: [id number] Patient Account Number: [id number] Date of Birth/Sex: 03-31-1928 (82 y.o. Female) Treating RN: Carolyne Fiscal, Debi Primary Care Physician: [name] Other Clinician: Referring Physician: [name] Treating Physician/Extender: Frann Rider in Treatment: 0 Active Problems ICD-10 Encounter Code Description Active Date Diagnosis I89.0 Lymphedema, not elsewhere classified 05/21/2015 Yes L97.212 Non-pressure chronic ulcer of right calf with fat layer 05/21/2015 Yes exposed Inactive Problems Resolved Problems Electronic Signature(s) Signed: 05/21/2015 3:09:42 PM By: [name] MD, FACS Entered By: [name] on 05/21/2015 15:09:42 [name] ([id number]) -------------------------------------------------------------------------------- Progress Note Details Patient Name: [name] Date of Service: 05/21/2015 2:15 PM Medical Record Number: [id number] Patient Account Number: [id number] Date of Birth/Sex:  August 20, 1928 (82 y.o. Female) Treating RN: Carolyne Fiscal, Debi Primary Care Physician: [name] Other Clinician: Referring Physician: [name] Treating Physician/Extender: Frann Rider in Treatment: 0 Subjective Chief Complaint Information obtained from Patient Patient presents to the wound care center for a consult due non healing wound which she's had for about 2 months History of Present Illness (HPI) The following HPI elements were documented for the patient's wound: Location: right lateral calf area Quality: Patient reports No Pain. Severity: Patient states wound are getting worse. Duration: Patient has had the wound for > 2 months prior to seeking treatment at the wound center Context: The wound occurred when the patient injured her leg and bruised it against a blunt object Modifying Factors: Consults to this date include:local care and no antibiotics Associated Signs and Symptoms: Patient reports having increase swelling. 82 year old patient who had a blunt injury to her right lower extremity about a few weeks ago now noticed that the wound had opened out and started draining fluid. She is known to have chronic lower extremity edema which has been there for several years. The PCP feels that the edema has not related to anemia or CHF. The patient's past medical history significant for adenocarcinoma of the colon status post resection, bowel obstruction with adhesions, essential hypertension, vertigo,chronic back pain, status post cholecystectomy, hysterectomy, appendectomy, colon surgery, bowel adhesion resection, hemorrhoid surgery. She has never been a smoker. Wound History Patient presents with 2 open wounds that have been present for approximately one month. Patient has been treating wounds in the following manner: neosporin and bandages. Laboratory tests have not been performed in the last  month. Patient reportedly has not tested positive for an antibiotic  resistant organism. Patient reportedly has not tested positive for osteomyelitis. Patient reportedly has not had testing performed to evaluate circulation in the legs. Patient experiences the following problems associated with their wounds: swelling. Patient History Information obtained from Patient. Allergies No allergies have been documented for the patient [name], [name] ([id number]) Family History Cancer - Child, Diabetes - Siblings, Father, Stroke - Father, Thyroid Problems - Siblings, No family history of Heart Disease, Hereditary Spherocytosis, Hypertension, Kidney Disease, Lung Disease, Seizures, Tuberculosis. Social History Never smoker, Marital Status - Married, Alcohol Use - Never, Drug Use - No History, Caffeine Use - Daily. Medical History Hematologic/Lymphatic Patient has history of Anemia - hx Cardiovascular Patient has history of Hypertension Musculoskeletal Patient has history of Osteoarthritis Oncologic Patient has history of Received Chemotherapy - 1989 Medical And Surgical History Notes Ear/Nose/Mouth/Throat vertigo Hematologic/Lymphatic hemachromatosis Gastrointestinal GERD Oncologic colon cancer Review of Systems (ROS) Constitutional Symptoms (General Health) The patient has no complaints or symptoms. Eyes Complains or has symptoms of Glasses / Contacts - contacts, and glasses. Respiratory The patient has no complaints or symptoms. Endocrine The patient has no complaints or symptoms. Genitourinary The patient has no complaints or symptoms. Immunological The patient has no complaints or symptoms. Integumentary (Skin) Complains or has symptoms of Wounds. Neurologic The patient has no complaints or symptoms. Medications: CAYDANCE, WRITER ([id number]) Her medications include calcium carbonate with vitamin D3, cholecalciferol, Estrace, Allegra, Lactaid only, Lidoderm patch, Prinzide, Antivert, Mobic, Reglan, Toprol-XL, Protonix, Zocor,  Ultracet. Objective Constitutional Pulse regular. Respirations normal and unlabored. Afebrile. Vitals Time Taken: 1:56 PM, Height: 61 in, Source: Stated, Weight: 142 lbs, Source: Stated, BMI: 26.8, Temperature: 98.0 F, Pulse: 62 bpm, Respiratory Rate: 20 breaths/min, Blood Pressure: 133/52 mmHg. Eyes Nonicteric. Reactive to light. Ears, Nose, Mouth, and Throat Lips, teeth, and gums WNL.Marland Kitchen Moist mucosa without lesions. Neck supple and nontender. No palpable supraclavicular or cervical adenopathy. Normal sized without goiter. Respiratory WNL. No retractions.. Cardiovascular Pedal Pulses WNL. ABI both right and left lower extremities is 1.2. she has stage I lymphedema both lower extremities. Gastrointestinal (GI) Abdomen without masses or tenderness.. No liver or spleen enlargement or tenderness.. Lymphatic No adneopathy. No adenopathy. No adenopathy. Musculoskeletal Adexa without tenderness or enlargement.. Digits and nails w/o clubbing, cyanosis, infection, petechiae, ischemia, or inflammatory conditions.Marland Kitchen Psychiatric Judgement and insight Intact.. No evidence of depression, anxiety, or agitation.. General Notes: the ulcerated areas have covered eschar and subcutaneous debris which I was able to sharply debride with a #3 curet. There was no bleeding. Integumentary (Hair, Skin) [name], [name] ([id number]) No suspicious lesions. No crepitus or fluctuance. No peri-wound warmth or erythema. No masses.. Wound #1 status is Open. Original cause of wound was Trauma. The wound is located on the Right,Proximal,Lateral Lower Leg. The wound measures 4.2cm length x 1.6cm width x 0.1cm depth; 5.278cm^2 area and 0.528cm^3 volume. The wound is limited to skin breakdown. There is no tunneling or undermining noted. There is a large amount of serosanguineous drainage noted. The wound margin is flat and intact. There is no granulation within the wound bed. There is a large (67-100%) amount of  necrotic tissue within the wound bed including Eschar. The periwound skin appearance exhibited: Localized Edema, Moist. Periwound temperature was noted as No Abnormality. Wound #2 status is Open. Original cause of wound was Trauma. The wound is located on the Right,Distal,Lateral Lower Leg. The wound measures 1cm length x 0.4cm width x 0.1cm depth; 0.314cm^2 area and  0.031cm^3 volume. The wound is limited to skin breakdown. There is no tunneling or undermining noted. There is a large amount of serosanguineous drainage noted. The wound margin is flat and intact. There is no granulation within the wound bed. There is a large (67-100%) amount of necrotic tissue within the wound bed including Eschar. The periwound skin appearance exhibited: Localized Edema. Assessment Active Problems ICD-10 I89.0 - Lymphedema, not elsewhere classified L97.212 - Non-pressure chronic ulcer of right calf with fat layer exposed This pleasant 82 year old patient who has underlying lymphedema which is possibly due to being dependent now has a nonhealing wound which was caused by a laceration and bruise. At this stage I do not believe she needs a venous or arterial duplex study. I have recommended: 1. Silver alginate to be applied every other day and covered with a bordered foam. 2. Compression stockings of the 20-30 mm variety and this can be applied all day except for bedtime 3. Elevation and exercise 4. Regular visits to the wound center She and her husband have read all questions answered and they will be compliant Procedures Wound #1 Wound #1 is a Trauma, Other located on the Right,Proximal,Lateral Lower Leg . There was a Skin/Subcutaneous Tissue Debridement [id number]) debridement with total area of 6.72 sq cm [name], [name] ([id number]) performed by [name], MD. with the following instrument(s): Curette to remove Viable and Non-Viable tissue/material including Exudate, Fibrin/Slough, and Subcutaneous  after achieving pain control using Other (lidocaine 4%). A time out was conducted prior to the start of the procedure. A Minimum amount of bleeding was controlled with Pressure. The procedure was tolerated well with a pain level of 0 throughout and a pain level of 0 following the procedure. Post Debridement Measurements: 4.2cm length x 1.6cm width x 0.2cm depth; 1.056cm^3 volume. Post procedure Diagnosis Wound #1: Same as Pre-Procedure Wound #2 Wound #2 is a Trauma, Other located on the Right,Distal,Lateral Lower Leg . There was a Skin/Subcutaneous Tissue Debridement [id number]) debridement with total area of 0.4 sq cm performed by [name], MD. with the following instrument(s): Curette to remove Viable and Non-Viable tissue/material including Exudate, Fibrin/Slough, and Subcutaneous after achieving pain control using Other (lidocaine 4%). A time out was conducted prior to the start of the procedure. A Minimum amount of bleeding was controlled with Pressure. The procedure was tolerated well with a pain level of 0 throughout and a pain level of 0 following the procedure. Post Debridement Measurements: 1cm length x 0.4cm width x 0.2cm depth; 0.063cm^3 volume. Post procedure Diagnosis Wound #2: Same as Pre-Procedure Plan Wound Cleansing: Wound #1 Right,Proximal,Lateral Lower Leg: Clean wound with Normal Saline. Cleanse wound with mild soap and water May Shower, gently pat wound dry prior to applying new dressing. Wound #2 Right,Distal,Lateral Lower Leg: Clean wound with Normal Saline. Cleanse wound with mild soap and water May Shower, gently pat wound dry prior to applying new dressing. Anesthetic: Wound #1 Right,Proximal,Lateral Lower Leg: Topical Lidocaine 4% cream applied to wound bed prior to debridement Wound #2 Right,Distal,Lateral Lower Leg: Topical Lidocaine 4% cream applied to wound bed prior to debridement Skin Barriers/Peri-Wound Care: Wound #1 Right,Proximal,Lateral  Lower Leg: Skin Prep Wound #2 Right,Distal,Lateral Lower Leg: Skin Prep Primary Wound Dressing: Wound #1 Right,Proximal,Lateral Lower Leg: Aquacel Ag Wound #2 Right,Distal,Lateral Lower Leg: [name], [name] ([id number]) Aquacel Ag Secondary Dressing: Wound #1 Right,Proximal,Lateral Lower Leg: Boardered Foam Dressing Wound #2 Right,Distal,Lateral Lower Leg: Boardered Foam Dressing Dressing Change Frequency: Wound #1 Right,Proximal,Lateral Lower Leg: Change dressing every other day.  Wound #2 Right,Distal,Lateral Lower Leg: Change dressing every other day. Follow-up Appointments: Wound #1 Right,Proximal,Lateral Lower Leg: Return Appointment in 1 week. Wound #2 Right,Distal,Lateral Lower Leg: Return Appointment in 1 week. Edema Control: Wound #1 Right,Proximal,Lateral Lower Leg: Elevate legs to the level of the heart and pump ankles as often as possible Other: - get and wear compression stockings Wound #2 Right,Distal,Lateral Lower Leg: Elevate legs to the level of the heart and pump ankles as often as possible Other: - get and wear compression stockings Additional Orders / Instructions: Wound #1 Right,Proximal,Lateral Lower Leg: Increase protein intake. Wound #2 Right,Distal,Lateral Lower Leg: Increase protein intake. This pleasant 82 year old patient who has underlying lymphedema which is possibly due to being dependent now has a nonhealing wound which was caused by a laceration and bruise. At this stage I do not believe she needs a venous or arterial duplex study. I have recommended: 1. Silver alginate to be applied every other day and covered with a bordered foam. 2. Compression stockings of the 20-30 mm variety and this can be applied all day except for bedtime 3. Elevation and exercise 4. Regular visits to the wound center She and her husband have read all questions answered and they will be compliant Electronic Signature(s) Signed: 05/21/2015 4:44:54 PM By: [name]  MD, FACS Previous Signature: 05/21/2015 3:16:51 PM Version By: [name] MD, FACS [name], [name] ([id number]) Entered By: [name] on 05/21/2015 16:44:53 [name] ([id number]) -------------------------------------------------------------------------------- ROS/PFSH Details Patient Name: [name] Date of Service: 05/21/2015 2:15 PM Medical Record Number: [id number] Patient Account Number: [id number] Date of Birth/Sex: 08-09-1928 (82 y.o. Female) Treating RN: Carolyne Fiscal, Debi Primary Care Physician: [name] Other Clinician: Referring Physician: [name] Treating Physician/Extender: Frann Rider in Treatment: 0 Information Obtained From Patient Wound History Do you currently have one or more open woundso Yes How many open wounds do you currently haveo 2 Approximately how long have you had your woundso one month How have you been treating your wound(s) until nowo neosporin and bandages Has your wound(s) ever healed and then re-openedo No Have you had any lab work done in the past montho No Have you tested positive for an antibiotic resistant organism (MRSA, No VRE)o Have you tested positive for osteomyelitis (bone infection)o No Have you had any tests for circulation on your legso No Have you had other problems associated with your woundso Swelling Eyes Complaints and Symptoms: Positive for: Glasses / Contacts - contacts, and glasses Integumentary (Skin) Complaints and Symptoms: Positive for: Wounds Constitutional Symptoms (General Health) Complaints and Symptoms: No Complaints or Symptoms Ear/Nose/Mouth/Throat Medical History: Past Medical History Notes: vertigo Hematologic/Lymphatic Medical History: Positive for: Anemia - hx Past Medical History NotesKAHLIYAH, [name] ([id number]) hemachromatosis Respiratory Complaints and Symptoms: No Complaints or Symptoms Cardiovascular Medical History: Positive for:  Hypertension Gastrointestinal Medical History: Past Medical History Notes: GERD Endocrine Complaints and Symptoms: No Complaints or Symptoms Genitourinary Complaints and Symptoms: No Complaints or Symptoms Immunological Complaints and Symptoms: No Complaints or Symptoms Musculoskeletal Medical History: Positive for: Osteoarthritis Neurologic Complaints and Symptoms: No Complaints or Symptoms Oncologic Medical History: Positive for: Received Chemotherapy - 1989 Past Medical History Notes: colon cancer Family and Social History [name], [name] ([id number]) Cancer: Yes - Child; Diabetes: Yes - Siblings, Father; Heart Disease: No; Hereditary Spherocytosis: No; Hypertension: No; Kidney Disease: No; Lung Disease: No; Seizures: No; Stroke: Yes - Father; Thyroid Problems: Yes - Siblings; Tuberculosis: No; Never smoker; Marital Status - Married; Alcohol Use: Never; Drug Use:  No History; Caffeine Use: Daily; Financial Concerns: No; Food, Clothing or Shelter Needs: No; Support System Lacking: No; Transportation Concerns: No; Advanced Directives: No; Patient does not want information on Advanced Directives; Do not resuscitate: No; Living Will: Yes (Not Provided); Medical Power of Attorney: No Physician Affirmation I have reviewed and agree with the above information. Electronic Signature(s) Signed: 05/21/2015 2:52:13 PM By: [name] MD, FACS Signed: 05/26/2015 4:14:54 PM By: Alric Quan Entered By: [name] on 05/21/2015 14:52:12 [name] ([id number]) -------------------------------------------------------------------------------- SuperBill Details Patient Name: [name] Date of Service: 05/21/2015 Medical Record Number: [id number] Patient Account Number: [id number] Date of Birth/Sex: 1928/10/20 (82 y.o. Female) Treating RN: Carolyne Fiscal, Debi Primary Care Physician: [name] Other Clinician: Referring Physician: [name] Treating  Physician/Extender: Frann Rider in Treatment: 0 Diagnosis Coding ICD-10 Codes Code Description I89.0 Lymphedema, not elsewhere classified L97.212 Non-pressure chronic ulcer of right calf with fat layer exposed Facility Procedures CPT4 Code: [id number] Description: 11042 - DEB SUBQ TISSUE 20 SQ CM/< ICD-10 Description Diagnosis I89.0 Lymphedema, not elsewhere classified L97.212 Non-pressure chronic ulcer of right calf with fat Modifier: layer exposed Quantity: 1 Physician Procedures CPT4 Code: [id number] Description: [id number] - WC PHYS LEVEL 4 - NEW PT ICD-10 Description Diagnosis I89.0 Lymphedema, not elsewhere classified L97.212 Non-pressure chronic ulcer of right calf with fat Modifier: 25 layer exposed Quantity: 1 CPT4 Code: [id number] Description: 11042 - WC PHYS SUBQ TISS 20 SQ CM ICD-10 Description Diagnosis I89.0 Lymphedema, not elsewhere classified L97.212 Non-pressure chronic ulcer of right calf with fat Modifier: layer exposed Quantity: 1 Electronic Signature(s) Signed: 05/21/2015 3:17:08 PM By: [name] MD, FACS Entered By: [name] on 05/21/2015 15:17:08

## 2015-05-26 NOTE — Progress Notes (Signed)
TONI, SCHNIPKE ([id number]) Visit Report for 05/21/2015 Abuse/Suicide Risk Screen Details Patient Name: [name], [name] Date of Service: 05/21/2015 2:15 PM Medical Record Number: [id number] Patient Account Number: [id number] Date of Birth/Sex: 18-Jun-1928 (82 y.o. Female) Treating RN: Carolyne Fiscal, Debi Primary Care Physician: [name] Other Clinician: Referring Physician: [name] Treating Physician/Extender: Frann Rider in Treatment: 0 Abuse/Suicide Risk Screen Items Answer ABUSE/SUICIDE RISK SCREEN: Has anyone close to you tried to hurt or harm you recentlyo No Do you feel uncomfortable with anyone in your familyo No Has anyone forced you do things that you didnot want to doo No Do you have any thoughts of harming yourselfo No Patient displays signs or symptoms of abuse and/or neglect. No Electronic Signature(s) Signed: 05/26/2015 4:14:54 PM By: Alric Quan Entered By: Alric Quan on 05/21/2015 14:09:53 [name] ([id number]) -------------------------------------------------------------------------------- Activities of Daily Living Details Patient Name: [name] Date of Service: 05/21/2015 2:15 PM Medical Record Number: [id number] Patient Account Number: [id number] Date of Birth/Sex: [birth year] (82 y.o. Female) Treating RN: Carolyne Fiscal, Debi Primary Care Physician: [name] Other Clinician: Referring Physician: [name] Treating Physician/Extender: Frann Rider in Treatment: 0 Activities of Daily Living Items Answer Activities of Daily Living (Please select one for each item) Drive Automobile Not Able Take Medications Completely Able Use Telephone Completely Able Care for Appearance Completely Able Use Toilet Completely Able Bath / Shower Completely Able Dress Self Completely Able Feed Self Completely Able Walk Need Assistance Get In / Out Bed Need Assistance Housework Not Able Prepare Meals Not Able Handle Money  Need Assistance Shop for Self Not Able Electronic Signature(s) Signed: 05/26/2015 4:14:54 PM By: Alric Quan Entered By: Alric Quan on 05/21/2015 14:10:33 [name] ([id number]) -------------------------------------------------------------------------------- Education Assessment Details Patient Name: [name] Date of Service: 05/21/2015 2:15 PM Medical Record Number: [id number] Patient Account Number: [id number] Date of Birth/Sex: [birth year] (82 y.o. Female) Treating RN: Carolyne Fiscal, Debi Primary Care Physician: [name] Other Clinician: Referring Physician: [name] Treating Physician/Extender: Frann Rider in Treatment: 0 Primary Learner Assessed: Patient Learning Preferences/Education Level/Primary Language Learning Preference: Explanation, Printed Material Highest Education Level: High School Preferred Language: English Cognitive Barrier Assessment/Beliefs Language Barrier: No Translator Needed: No Memory Deficit: No Emotional Barrier: No Cultural/Religious Beliefs Affecting Medical No Care: Physical Barrier Assessment Impaired Vision: Yes Glasses, Contacts Impaired Hearing: No Decreased Hand dexterity: No Knowledge/Comprehension Assessment Knowledge Level: High Comprehension Level: High Ability to understand written High instructions: Ability to understand verbal High instructions: Motivation Assessment Anxiety Level: Calm Cooperation: Cooperative Education Importance: Acknowledges Need Interest in Health Problems: Asks Questions Perception: Coherent Willingness to Engage in Self- High Management Activities: Readiness to Engage in Self- High Management Activities: Electronic Signature(s) [name], [name] ([id number]) Signed: 05/26/2015 4:14:54 PM By: Alric Quan Entered By: Alric Quan on 05/21/2015 14:11:01 [name]  ([id number]) -------------------------------------------------------------------------------- Fall Risk Assessment Details Patient Name: [name] Date of Service: 05/21/2015 2:15 PM Medical Record Number: [id number] Patient Account Number: [id number] Date of Birth/Sex: November 03, 1928 (82 y.o. Female) Treating RN: Carolyne Fiscal, Debi Primary Care Physician: [name] Other Clinician: Referring Physician: [name] Treating Physician/Extender: Frann Rider in Treatment: 0 Fall Risk Assessment Items Have you had 2 or more falls in the last 12 monthso 0 No Have you had any fall that resulted in injury in the last 12 monthso 0 No FALL RISK ASSESSMENT: History of falling - immediate or within 3 months 0 No Secondary diagnosis 0 No Ambulatory aid None/bed rest/wheelchair/nurse 0 No Crutches/cane/walker 15  Yes Furniture 0 No IV Access/Saline Lock 0 No Gait/Training Normal/bed rest/immobile 0 No Weak 10 Yes Impaired 20 Yes Mental Status Oriented to own ability 0 Yes Electronic Signature(s) Signed: 05/26/2015 4:14:54 PM By: Alric Quan Entered By: Alric Quan on 05/21/2015 14:11:27 [name] ([id number]) -------------------------------------------------------------------------------- Foot Assessment Details Patient Name: [name] Date of Service: 05/21/2015 2:15 PM Medical Record Number: [id number] Patient Account Number: [id number] Date of Birth/Sex: 07/24/1928 (82 y.o. Female) Treating RN: Carolyne Fiscal, Debi Primary Care Physician: [name] Other Clinician: Referring Physician: [name] Treating Physician/Extender: Frann Rider in Treatment: 0 Foot Assessment Items Site Locations + = Sensation present, - = Sensation absent, C = Callus, U = Ulcer R = Redness, W = Warmth, M = Maceration, PU = Pre-ulcerative lesion F = Fissure, S = Swelling, D = Dryness Assessment Right: Left: Other Deformity: No No Prior Foot Ulcer: No  No Prior Amputation: No No Charcot Joint: No No Ambulatory Status: Ambulatory With Help Assistance Device: Walker Gait: Steady Electronic Signature(s) Signed: 05/26/2015 4:14:54 PM By: Alric Quan Entered By: Alric Quan on 05/21/2015 14:15:37 [name] ([id number]) -------------------------------------------------------------------------------- Nutrition Risk Assessment Details Patient Name: [name] Date of Service: 05/21/2015 2:15 PM Medical Record Number: [id number] Patient Account Number: [id number] Date of Birth/Sex: September 06, 1928 (82 y.o. Female) Treating RN: Carolyne Fiscal, Debi Primary Care Physician: [name] Other Clinician: Referring Physician: [name] Treating Physician/Extender: Frann Rider in Treatment: 0 Height (in): 61 Weight (lbs): 142 Body Mass Index (BMI): 26.8 Nutrition Risk Assessment Items NUTRITION RISK SCREEN: I have an illness or condition that made me change the kind and/or 2 Yes amount of food I eat I eat fewer than two meals per day 0 No I eat few fruits and vegetables, or milk products 0 No I have three or more drinks of beer, liquor or wine almost every day 0 No I have tooth or mouth problems that make it hard for me to eat 0 No I don't always have enough money to buy the food I need 0 No I eat alone most of the time 0 No I take three or more different prescribed or over-the-counter drugs a 1 Yes day Without wanting to, I have lost or gained 10 pounds in the last six 0 No months I am not always physically able to shop, cook and/or feed myself 2 Yes Nutrition Protocols Good Risk Protocol Moderate Risk Protocol Electronic Signature(s) Signed: 05/26/2015 4:14:54 PM By: Alric Quan Entered By: Alric Quan on 05/21/2015 14:11:38

## 2015-05-28 ENCOUNTER — Encounter: Payer: Commercial Managed Care - HMO | Admitting: Surgery

## 2015-05-28 DIAGNOSIS — K219 Gastro-esophageal reflux disease without esophagitis: Secondary | ICD-10-CM | POA: Diagnosis not present

## 2015-05-28 DIAGNOSIS — L97212 Non-pressure chronic ulcer of right calf with fat layer exposed: Secondary | ICD-10-CM | POA: Diagnosis not present

## 2015-05-28 DIAGNOSIS — I890 Lymphedema, not elsewhere classified: Secondary | ICD-10-CM | POA: Diagnosis not present

## 2015-05-28 DIAGNOSIS — D649 Anemia, unspecified: Secondary | ICD-10-CM | POA: Diagnosis not present

## 2015-05-28 DIAGNOSIS — R600 Localized edema: Secondary | ICD-10-CM | POA: Diagnosis not present

## 2015-05-28 DIAGNOSIS — Z85038 Personal history of other malignant neoplasm of large intestine: Secondary | ICD-10-CM | POA: Diagnosis not present

## 2015-05-28 DIAGNOSIS — G8929 Other chronic pain: Secondary | ICD-10-CM | POA: Diagnosis not present

## 2015-05-28 DIAGNOSIS — S81801A Unspecified open wound, right lower leg, initial encounter: Secondary | ICD-10-CM | POA: Diagnosis not present

## 2015-05-28 DIAGNOSIS — I10 Essential (primary) hypertension: Secondary | ICD-10-CM | POA: Diagnosis not present

## 2015-05-28 DIAGNOSIS — M549 Dorsalgia, unspecified: Secondary | ICD-10-CM | POA: Diagnosis not present

## 2015-05-28 DIAGNOSIS — M1990 Unspecified osteoarthritis, unspecified site: Secondary | ICD-10-CM | POA: Diagnosis not present

## 2015-05-29 NOTE — Progress Notes (Signed)
MAVERY, MILLING ([id number]) Visit Report for 05/28/2015 Arrival Information Details Patient Name: [name], [name] Date of Service: 05/28/2015 2:15 PM Medical Record Number: [id number] Patient Account Number: [id number] Date of Birth/Sex: 1928/04/30 (82 y.o. Female) Treating RN: Carolyne Fiscal, Debi Primary Care Physician: [name] Other Clinician: Referring Physician: [name] Treating Physician/Extender: Frann Rider in Treatment: 1 Visit Information History Since Last Visit All ordered tests and consults were completed: No Patient Arrived: [name] Added or deleted any medications: No Arrival Time: 14:18 Any new allergies or adverse reactions: No Accompanied By: self Had a fall or experienced change in No Transfer Assistance: None activities of daily living that may affect Patient Identification Verified: Yes risk of falls: Secondary Verification Process Completed: Yes Signs or symptoms of abuse/neglect since last No Patient Requires Transmission-Based No visito Precautions: Hospitalized since last visit: No Patient Has Alerts: No Pain Present Now: No Electronic Signature(s) Signed: 05/28/2015 4:38:13 PM By: Alric Quan Entered By: Alric Quan on 05/28/2015 14:18:25 [name] ([id number]) -------------------------------------------------------------------------------- Clinic Level of Care Assessment Details Patient Name: [name] Date of Service: 05/28/2015 2:15 PM Medical Record Number: [id number] Patient Account Number: [id number] Date of Birth/Sex: 1928-12-12 (82 y.o. Female) Treating RN: Carolyne Fiscal, Debi Primary Care Physician: [name] Other Clinician: Referring Physician: [name] Treating Physician/Extender: Frann Rider in Treatment: 1 Clinic Level of Care Assessment Items TOOL 4 Quantity Score _0  - Use when only an EandM is performed on FOLLOW-UP visit 0 ASSESSMENTS - Nursing Assessment / Reassessment _1   - Reassessment of Co-morbidities (includes updates in patient status) 0 X - Reassessment of Adherence to Treatment Plan 1 5 ASSESSMENTS - Wound and Skin Assessment / Reassessment _2  - Simple Wound Assessment / Reassessment - one wound 0 X - Complex Wound Assessment / Reassessment - multiple wounds 2 5 _3  - Dermatologic / Skin Assessment (not related to wound area) 0 ASSESSMENTS - Focused Assessment _4  - Circumferential Edema Measurements - multi extremities 0 _5  - Nutritional Assessment / Counseling / Intervention 0 _6  - Lower Extremity Assessment (monofilament, tuning fork, pulses) 0 _7  - Peripheral Arterial Disease Assessment (using hand held doppler) 0 ASSESSMENTS - Ostomy and/or Continence Assessment and Care _8  - Incontinence Assessment and Management 0 _9  - Ostomy Care Assessment and Management (repouching, etc.) 0 PROCESS - Coordination of Care X - Simple Patient / Family Education for ongoing care 1 15 _10  - Complex (extensive) Patient / Family Education for ongoing care 0 _11  - Staff obtains Programmer, systems, Records, Test Results / Process Orders 0 _12  - Staff telephones HHA, Nursing Homes / Clarify orders / etc 0 _13  - Routine Transfer to another Facility (non-emergent condition) 0 [name], [name] ([id number]) _14  - Routine Hospital Admission (non-emergent condition) 0 _15  - New Admissions / Biomedical engineer / Ordering NPWT, Apligraf, etc. 0 _16  - Emergency Hospital Admission (emergent condition) 0 X - Simple Discharge Coordination 1 10 _17  - Complex (extensive) Discharge Coordination 0 PROCESS - Special Needs _18  - Pediatric / Minor Patient Management 0 _19  - Isolation Patient Management 0 _20  - Hearing / Language / Visual special needs 0 _21  - Assessment of Community assistance (transportation, D/C planning, etc.) 0 _22  - Additional assistance / Altered mentation 0 _23  - Support Surface(s) Assessment (bed, cushion, seat, etc.) 0 INTERVENTIONS - Wound Cleansing / Measurement _24  -  Simple Wound Cleansing - one wound 0 X - Complex Wound Cleansing - multiple wounds 2 5 X - Wound Imaging (photographs - any number of wounds) 1 5 _25  - Wound  Tracing (instead of photographs) 0 X - Simple Wound Measurement - one wound 1 5 _0  - Complex Wound Measurement - multiple wounds 0 INTERVENTIONS - Wound Dressings X - Small Wound Dressing one or multiple wounds 2 10 _1  - Medium Wound Dressing one or multiple wounds 0 _2  - Large Wound Dressing one or multiple wounds 0 X - Application of Medications - topical 1 5 <[id number]>_2<\/DJMEQASTMHDQQIWL>_7  - Application of Medications - injection 0 INTERVENTIONS - Miscellaneous _4  - External ear exam 0 [name], [name] ([id number]) _5  - Specimen Collection (cultures, biopsies, blood, body fluids, etc.) 0 _6  - Specimen(s) / Culture(s) sent or taken to Lab for analysis 0 _7  - Patient Transfer (multiple staff / Harrel Lemon Lift / Similar devices) 0 _8  - Simple Staple / Suture removal (25 or less) 0 _9  - Complex Staple / Suture removal (26 or more) 0 _10  - Hypo / Hyperglycemic Management (close monitor of Blood Glucose) 0 _11  - Ankle / Brachial Index (ABI) - do not check if billed separately 0 X - Vital Signs 1 5 Has the patient been seen at the hospital within the last three years: Yes Total Score: 90 Level Of Care: New/Established - Level 3 Electronic Signature(s) Signed: 05/28/2015 4:38:13 PM By: Alric Quan Entered By: Alric Quan on 05/28/2015 15:06:17 [name] ([id number]) -------------------------------------------------------------------------------- Encounter Discharge Information Details Patient Name: [name] Date of Service: 05/28/2015 2:15 PM Medical Record Number: [id number] Patient Account Number: [id number] Date of Birth/Sex: 1928/04/24 (82 y.o. Female) Treating RN: Carolyne Fiscal, Debi Primary Care Physician: [name] Other Clinician: Referring Physician: [name] Treating Physician/Extender: Frann Rider in Treatment:  1 Encounter Discharge Information Items Discharge Pain Level: 0 Discharge Condition: Stable Ambulatory Status: Walker Discharge Destination: Home Transportation: Private Auto Accompanied By: husband Schedule Follow-up Appointment: Yes Medication Reconciliation completed Yes and provided to Patient/Care Eduar Kumpf: Provided on Clinical Summary of Care: 05/28/2015 Form Type Recipient Paper Patient [facility] Electronic Signature(s) Signed: 05/28/2015 2:53:01 PM By: Ruthine Dose Entered By: Ruthine Dose on 05/28/2015 14:53:01 [name] ([id number]) -------------------------------------------------------------------------------- Lower Extremity Assessment Details Patient Name: [name] Date of Service: 05/28/2015 2:15 PM Medical Record Number: [id number] Patient Account Number: [id number] Date of Birth/Sex: April 24, 1928 (82 y.o. Female) Treating RN: Carolyne Fiscal, Debi Primary Care Physician: [name] Other Clinician: Referring Physician: [name] Treating Physician/Extender: Frann Rider in Treatment: 1 Vascular Assessment Pulses: Posterior Tibial Dorsalis Pedis Palpable: [Right:Yes] Extremity colors, hair growth, and conditions: Extremity Color: [Right:Mottled] Temperature of Extremity: [Right:Warm] Capillary Refill: [Right:< 3 seconds] Toe Nail Assessment Left: Right: Thick: No Discolored: No Deformed: No Improper Length and Hygiene: No Electronic Signature(s) Signed: 05/28/2015 4:38:13 PM By: Alric Quan Entered By: Alric Quan on 05/28/2015 14:23:56 [name] ([id number]) -------------------------------------------------------------------------------- Multi Wound Chart Details Patient Name: [name] Date of Service: 05/28/2015 2:15 PM Medical Record Number: [id number] Patient Account Number: [id number] Date of Birth/Sex: 01/10/1929 (82 y.o. Female) Treating RN: Carolyne Fiscal, Debi Primary Care Physician: [name] Other  Clinician: Referring Physician: [name] Treating Physician/Extender: Frann Rider in Treatment: 1 Vital Signs Height(in): 61 Pulse(bpm): 61 Weight(lbs): 142 Blood Pressure 139/42 (mmHg): Body Mass Index(BMI): 27 Temperature(F): 97.6 Respiratory Rate 20 (breaths/min): Photos: [1:No Photos] [2:No Photos] [N/A:N/A] Wound Location: [1:Right Lower Leg - Lateral, Proximal] [2:Right Lower Leg - Lateral, Distal] [N/A:N/A] Wounding Event: [1:Trauma] [2:Trauma] [N/A:N/A] Primary Etiology: [1:Trauma, Other] [2:Trauma, Other] [N/A:N/A] Comorbid History: [1:Anemia, Hypertension, Osteoarthritis, Received Chemotherapy] [2:Anemia, Hypertension, Osteoarthritis, Received Chemotherapy] [N/A:N/A] Date Acquired: [1:04/20/2015] [2:04/20/2015] [N/A:N/A] Weeks of Treatment: [1:1] [2:1] [N/A:N/A]  Wound Status: [1:Open] [2:Open] [N/A:N/A] Measurements L x W x D 3.2x1.2x0.1 [2:0.8x0.3x0.1] [N/A:N/A] (cm) Area (cm) : [1:3.016] [2:0.188] [N/A:N/A] Volume (cm) : [1:0.302] [2:0.019] [N/A:N/A] % Reduction in Area: [1:42.90%] [2:40.10%] [N/A:N/A] % Reduction in Volume: 42.80% [2:38.70%] [N/A:N/A] Classification: [1:Partial Thickness] [2:Partial Thickness] [N/A:N/A] Exudate Amount: [1:Large] [2:Large] [N/A:N/A] Exudate Type: [1:Serosanguineous] [2:Serosanguineous] [N/A:N/A] Exudate Color: [1:red, brown] [2:red, brown] [N/A:N/A] Wound Margin: [1:Flat and Intact] [2:Flat and Intact] [N/A:N/A] Granulation Amount: [1:Medium (34-66%)] [2:Large (67-100%)] [N/A:N/A] Granulation Quality: [1:Pink] [2:Red, Pink] [N/A:N/A] Necrotic Amount: [1:Medium (34-66%)] [2:Small (1-33%)] [N/A:N/A] Exposed Structures: [1:Fascia: No Fat: No Tendon: No Muscle: No Joint: No] [2:Fascia: No Fat: No Tendon: No Muscle: No Joint: No] [N/A:N/A] Bone: No Bone: No Limited to Skin Limited to Skin Breakdown Breakdown Epithelialization: None None N/A Periwound Skin Texture: Edema: Yes Edema: Yes N/A Periwound Skin Moist:  Yes Moist: No N/A Moisture: Periwound Skin Color: No Abnormalities Noted No Abnormalities Noted N/A Temperature: No Abnormality No Abnormality N/A Tenderness on Yes Yes N/A Palpation: Wound Preparation: Ulcer Cleansing: Ulcer Cleansing: N/A Rinsed/Irrigated with Rinsed/Irrigated with Saline Saline Topical Anesthetic Topical Anesthetic Applied: Other: lidocaine Applied: Other: lidocaine 4% 4% Treatment Notes Electronic Signature(s) Signed: 05/28/2015 4:38:13 PM By: Alejandro Mulling Entered By: Alejandro Mulling on 05/28/2015 14:32:53 [name] ([id number]) -------------------------------------------------------------------------------- Multi-Disciplinary Care Plan Details Patient Name: [name] Date of Service: 05/28/2015 2:15 PM Medical Record Number: [id number] Patient Account Number: [id number] Date of Birth/Sex: [birth year] (82 y.o. Female) Treating RN: Ashok Cordia, Debi Primary Care Physician: Dale Avon Other Clinician: Referring Physician: Dale Nixa Treating Physician/Extender: Rudene Re in Treatment: 1 Active Inactive Abuse / Safety / Falls / Self Care Management Nursing Diagnoses: Potential for falls Goals: Patient will remain injury free Date Initiated: 05/21/2015 Goal Status: Active Interventions: Assess fall risk on admission and as needed Notes: Nutrition Nursing Diagnoses: Imbalanced nutrition Goals: Patient/caregiver agrees to and verbalizes understanding of need to use nutritional supplements and/or vitamins as prescribed Date Initiated: 05/21/2015 Goal Status: Active Interventions: Assess patient nutrition upon admission and as needed per policy Notes: Orientation to the Wound Care Program Nursing Diagnoses: Knowledge deficit related to the wound healing center program Goals: Patient/caregiver will verbalize understanding of the Wound Healing Center Program [facility] ([id number]) Date Initiated: 05/21/2015 Goal  Status: Active Interventions: Provide education on orientation to the wound center Notes: Pain, Acute or Chronic Nursing Diagnoses: Pain, acute or chronic: actual or potential Potential alteration in comfort, pain Goals: Patient/caregiver will verbalize adequate pain control between visits Date Initiated: 05/21/2015 Goal Status: Active Patient/caregiver will verbalize comfort level met Date Initiated: 05/21/2015 Goal Status: Active Interventions: Complete pain assessment as per visit requirements Notes: Wound/Skin Impairment Nursing Diagnoses: Impaired tissue integrity Goals: Ulcer/skin breakdown will have a volume reduction of 30% by week 4 Date Initiated: 05/21/2015 Goal Status: Active Ulcer/skin breakdown will have a volume reduction of 50% by week 8 Date Initiated: 05/21/2015 Goal Status: Active Ulcer/skin breakdown will have a volume reduction of 80% by week 12 Date Initiated: 05/21/2015 Goal Status: Active Interventions: Assess patient/caregiver ability to obtain necessary supplies Assess ulceration(s) every visit ZINEB, GLADE ([id number]) Notes: Electronic Signature(s) Signed: 05/28/2015 4:38:13 PM By: Alejandro Mulling Entered By: Alejandro Mulling on 05/28/2015 14:32:44 [name] ([id number]) -------------------------------------------------------------------------------- Pain Assessment Details Patient Name: [name] Date of Service: 05/28/2015 2:15 PM Medical Record Number: [id number] Patient Account Number: [id number] Date of Birth/Sex: 1928-08-18 (82 y.o. Female) Treating RN: Ashok Cordia, Debi Primary Care Physician: Dale Hanaford Other Clinician: Referring Physician: Dale  Treating Physician/Extender: Meyer Russel  Errol Weeks in Treatment: 1 Active Problems Location of Pain Severity and Description of Pain Patient Has Paino No Site Locations Pain Management and Medication Current Pain Management: Electronic Signature(s) Signed: 05/28/2015  4:38:13 PM By: Alric Quan Entered By: Alric Quan on 05/28/2015 14:20:31 [name] ([id number]) -------------------------------------------------------------------------------- Patient/Caregiver Education Details Patient Name: [name] Date of Service: 05/28/2015 2:15 PM Medical Record Number: [id number] Patient Account Number: [id number] Date of Birth/Gender: 05-27-1928 (82 y.o. Female) Treating RN: Carolyne Fiscal, Debi Primary Care Physician: [name] Other Clinician: Referring Physician: [name] Treating Physician/Extender: Frann Rider in Treatment: 1 Education Assessment Education Provided To: Patient Education Topics Provided Wound/Skin Impairment: Handouts: Other: change dressing as ordered Methods: Demonstration, Explain/Verbal Responses: State content correctly Electronic Signature(s) Signed: 05/28/2015 4:38:13 PM By: Alric Quan Entered By: Alric Quan on 05/28/2015 14:36:58 [name] ([id number]) -------------------------------------------------------------------------------- Wound Assessment Details Patient Name: [name] Date of Service: 05/28/2015 2:15 PM Medical Record Number: [id number] Patient Account Number: [id number] Date of Birth/Sex: 1928/07/23 (82 y.o. Female) Treating RN: Carolyne Fiscal, Debi Primary Care Physician: [name] Other Clinician: Referring Physician: [name] Treating Physician/Extender: Frann Rider in Treatment: 1 Wound Status Wound Number: 1 Primary Trauma, Other Etiology: Wound Location: Right Lower Leg - Lateral, Proximal Wound Open Status: Wounding Event: Trauma Comorbid Anemia, Hypertension, Osteoarthritis, Date Acquired: 04/20/2015 History: Received Chemotherapy Weeks Of Treatment: 1 Clustered Wound: No Photos Photo Uploaded By: Alric Quan on 05/28/2015 16:34:23 Wound Measurements Length: (cm) 3.2 % Reduction in Width: (cm) 1.2 % Reduction  in Depth: (cm) 0.1 Epithelializati Area: (cm) 3.016 Tunneling: Volume: (cm) 0.302 Undermining: Area: 42.9% Volume: 42.8% on: None No No Wound Description Classification: Partial Thickness Wound Margin: Flat and Intact Exudate Amount: Large Exudate Type: Serosanguineous Exudate Color: red, brown Foul Odor After Cleansing: No Wound Bed Granulation Amount: Medium (34-66%) Exposed Structure Granulation Quality: Pink Fascia Exposed: No Necrotic Amount: Medium (34-66%) Fat Layer Exposed: No [name], [name] ([id number]) Necrotic Quality: Adherent Slough Tendon Exposed: No Muscle Exposed: No Joint Exposed: No Bone Exposed: No Limited to Skin Breakdown Periwound Skin Texture Texture Color No Abnormalities Noted: No No Abnormalities Noted: No Localized Edema: Yes Temperature / Pain Moisture Temperature: No Abnormality No Abnormalities Noted: No Tenderness on Palpation: Yes Moist: Yes Wound Preparation Ulcer Cleansing: Rinsed/Irrigated with Saline Topical Anesthetic Applied: Other: lidocaine 4%, Treatment Notes Wound #1 (Right, Proximal, Lateral Lower Leg) 1. Cleansed with: Clean wound with Normal Saline 2. Anesthetic Topical Lidocaine 4% cream to wound bed prior to debridement 3. Peri-wound Care: Skin Prep 4. Dressing Applied: Prisma Ag 5. Secondary Dressing Applied Bordered Foam Dressing Electronic Signature(s) Signed: 05/28/2015 4:38:13 PM By: Alric Quan Entered By: Alric Quan on 05/28/2015 14:27:51 [name] ([id number]) -------------------------------------------------------------------------------- Wound Assessment Details Patient Name: [name] Date of Service: 05/28/2015 2:15 PM Medical Record Number: [id number] Patient Account Number: [id number] Date of Birth/Sex: 1928-05-17 (82 y.o. Female) Treating RN: Carolyne Fiscal, Debi Primary Care Physician: [name] Other Clinician: Referring Physician: [name] Treating  Physician/Extender: Frann Rider in Treatment: 1 Wound Status Wound Number: 2 Primary Trauma, Other Etiology: Wound Location: Right Lower Leg - Lateral, Distal Wound Open Status: Wounding Event: Trauma Comorbid Anemia, Hypertension, Osteoarthritis, Date Acquired: 04/20/2015 History: Received Chemotherapy Weeks Of Treatment: 1 Clustered Wound: No Photos Photo Uploaded By: Alric Quan on 05/28/2015 16:34:23 Wound Measurements Length: (cm) 0.8 % Reduction in Width: (cm) 0.3 % Reduction in Depth: (cm) 0.1 Epithelializati Area: (cm) 0.188 Tunneling: Volume: (cm) 0.019 Undermining: Area: 40.1% Volume: 38.7% on:  None No No Wound Description Classification: Partial Thickness Wound Margin: Flat and Intact Exudate Amount: Large Exudate Type: Serosanguineous Exudate Color: red, brown Foul Odor After Cleansing: No Wound Bed Granulation Amount: Large (67-100%) Exposed Structure Granulation Quality: Red, Pink Fascia Exposed: No Necrotic Amount: Small (1-33%) Fat Layer Exposed: No [name], [name] ([id number]) Necrotic Quality: Adherent Slough Tendon Exposed: No Muscle Exposed: No Joint Exposed: No Bone Exposed: No Limited to Skin Breakdown Periwound Skin Texture Texture Color No Abnormalities Noted: No No Abnormalities Noted: No Localized Edema: Yes Temperature / Pain Moisture Temperature: No Abnormality No Abnormalities Noted: No Tenderness on Palpation: Yes Moist: No Wound Preparation Ulcer Cleansing: Rinsed/Irrigated with Saline Topical Anesthetic Applied: Other: lidocaine 4%, Treatment Notes Wound #2 (Right, Distal, Lateral Lower Leg) 1. Cleansed with: Clean wound with Normal Saline 2. Anesthetic Topical Lidocaine 4% cream to wound bed prior to debridement 3. Peri-wound Care: Skin Prep 4. Dressing Applied: Prisma Ag 5. Secondary Dressing Applied Bordered Foam Dressing Electronic Signature(s) Signed: 05/28/2015 4:38:13 PM By: Alric Quan Entered By: Alric Quan on 05/28/2015 14:27:21 [name] ([id number]) -------------------------------------------------------------------------------- [name] Details Patient Name: [name] Date of Service: 05/28/2015 2:15 PM Medical Record Number: [id number] Patient Account Number: [id number] Date of Birth/Sex: 07-Mar-1928 (82 y.o. Female) Treating RN: Carolyne Fiscal, Debi Primary Care Physician: [name] Other Clinician: Referring Physician: [name] Treating Physician/Extender: Frann Rider in Treatment: 1 Vital Signs Time Taken: 14:20 Temperature (F): 97.6 Height (in): 61 Pulse (bpm): 61 Weight (lbs): 142 Respiratory Rate (breaths/min): 20 Body Mass Index (BMI): 26.8 Blood Pressure (mmHg): 139/42 Reference Range: 80 - 120 mg / dl Electronic Signature(s) Signed: 05/28/2015 4:38:13 PM By: Alric Quan Entered By: Alric Quan on 05/28/2015 14:23:38

## 2015-05-29 NOTE — Progress Notes (Signed)
CRISTIAN, SLAVEY ([id number]) Visit Report for 05/28/2015 Chief Complaint Document Details Patient Name: [name], [name] 05/28/2015 2:15 Date of Service: PM Medical Record [id number] Number: Patient Account Number: [id number] Oct 02, 1928 (82 y.o. Treating RN: [name] Date of Birth/Sex: Female) Other Clinician: Primary Care Physician: [name] Treating [name] Referring Physician: [name] Physician/Extender: [name] in Treatment: 1 Information Obtained from: Patient Chief Complaint Patient presents to the wound care center for a consult due non healing wound which she's had for about 2 months Electronic Signature(s) Signed: 05/28/2015 2:38:08 PM By: [name] MD, FACS Entered By: [name] on 05/28/2015 14:38:08 [name] ([id number]) -------------------------------------------------------------------------------- HPI Details Patient Name: [name] 05/28/2015 2:15 Date of Service: PM Medical Record [id number] Number: Patient Account Number: [id number] 02/01/1929 (82 y.o. Treating RN: [name] Date of Birth/Sex: Female) Other Clinician: Primary Care Physician: [name] Treating [name] Referring Physician: [name] Physician/Extender: Weeks in Treatment: 1 History of Present Illness Location: right lateral calf area Quality: Patient reports No Pain. Severity: Patient states wound are getting worse. Duration: Patient has had the wound for > 2 months prior to seeking treatment at the wound center Context: The wound occurred when the patient injured her leg and bruised it against a blunt object Modifying Factors: Consults to this date include:local care and no antibiotics Associated Signs and Symptoms: Patient reports having increase swelling. HPI Description: 82 year old patient who had a blunt injury to her right lower extremity about a few weeks ago now noticed that the wound had opened out and started  draining fluid. She is known to have chronic lower extremity edema which has been there for several years. The PCP feels that the edema has not related to anemia or CHF. The patient's past medical history significant for adenocarcinoma of the colon status post resection, bowel obstruction with adhesions, essential hypertension, vertigo,chronic back pain, status post cholecystectomy, hysterectomy, appendectomy, colon surgery, bowel adhesion resection, hemorrhoid surgery. She has never been a smoker. Electronic Signature(s) Signed: 05/28/2015 2:38:17 PM By: [name] MD, FACS Entered By: [name] on 05/28/2015 14:38:17 [name] ([id number]) -------------------------------------------------------------------------------- Physical Exam Details Patient Name: [name] 05/28/2015 2:15 Date of Service: PM Medical Record [id number] Number: Patient Account Number: [id number] [birth year] (82 y.o. Treating RN: [name] Date of Birth/Sex: Female) Other Clinician: Primary Care Physician: [name] Treating [name] Referring Physician: [name] Physician/Extender: Weeks in Treatment: 1 Constitutional . Pulse regular. Respirations normal and unlabored. Afebrile. . Eyes Nonicteric. Reactive to light. Ears, Nose, Mouth, and Throat Lips, teeth, and gums WNL.Marland Kitchen Moist mucosa without lesions. Neck supple and nontender. No palpable supraclavicular or cervical adenopathy. Normal sized without goiter. Respiratory WNL. No retractions.. Cardiovascular Pedal Pulses WNL. No clubbing, cyanosis or edema. Lymphatic No adneopathy. No adenopathy. No adenopathy. Musculoskeletal Adexa without tenderness or enlargement.. Digits and nails w/o clubbing, cyanosis, infection, petechiae, ischemia, or inflammatory conditions.. Integumentary (Hair, Skin) No suspicious lesions. No crepitus or fluctuance. No peri-wound warmth or erythema. No masses.Marland Kitchen Psychiatric Judgement  and insight Intact.. No evidence of depression, anxiety, or agitation.. Notes the wound looks much better and has superficial debris which was washed out with moist saline gauze. Brisk bleeding was controlled with pressure. The edema has gone down remarkably. Electronic Signature(s) Signed: 05/28/2015 2:39:07 PM By: [name] MD, FACS Entered By: [name] on 05/28/2015 14:39:06 [name] ([id number]) -------------------------------------------------------------------------------- Physician Orders Details Patient Name: [name] 05/28/2015 2:15 Date of Service: PM Medical Record [id number] Number: Patient Account Number:  [id number] Mar 06, 1928 (82 y.o. Treating RN: [name] Date of Birth/Sex: Female) Other Clinician: Primary Care Physician: [name] Treating [name] Referring Physician: [name] Physician/Extender: [name] in Treatment: 1 Verbal / Phone Orders: Yes Clinician: Pinkerton, Debi Read Back and Verified: Yes Diagnosis Coding Wound Cleansing Wound #1 Right,Proximal,Lateral Lower Leg o Clean wound with Normal Saline. o Cleanse wound with mild soap and water o May Shower, gently pat wound dry prior to applying new dressing. Wound #2 Right,Distal,Lateral Lower Leg o Clean wound with Normal Saline. o Cleanse wound with mild soap and water o May Shower, gently pat wound dry prior to applying new dressing. Anesthetic Wound #1 Right,Proximal,Lateral Lower Leg o Topical Lidocaine 4% cream applied to wound bed prior to debridement Wound #2 Right,Distal,Lateral Lower Leg o Topical Lidocaine 4% cream applied to wound bed prior to debridement Skin Barriers/Peri-Wound Care Wound #1 Right,Proximal,Lateral Lower Leg o Skin Prep Wound #2 Right,Distal,Lateral Lower Leg o Skin Prep Primary Wound Dressing Wound #1 Right,Proximal,Lateral Lower Leg o Prisma Ag - moisten with saline Wound #2 Right,Distal,Lateral Lower  Leg o Prisma Ag - moisten with saline Secondary Dressing Wound #1 Right,Proximal,Lateral Lower Leg [name], [name] ([id number]) o Boardered Foam Dressing Wound #2 Right,Distal,Lateral Lower Leg o Boardered Foam Dressing Dressing Change Frequency Wound #1 Right,Proximal,Lateral Lower Leg o Change dressing every other day. Wound #2 Right,Distal,Lateral Lower Leg o Change dressing every other day. Follow-up Appointments Wound #1 Right,Proximal,Lateral Lower Leg o Return Appointment in 1 week. Wound #2 Right,Distal,Lateral Lower Leg o Return Appointment in 1 week. Edema Control Wound #1 Right,Proximal,Lateral Lower Leg o Elevate legs to the level of the heart and pump ankles as often as possible o Other: - get and wear compression stockings Wound #2 Right,Distal,Lateral Lower Leg o Elevate legs to the level of the heart and pump ankles as often as possible o Other: - get and wear compression stockings Additional Orders / Instructions Wound #1 Right,Proximal,Lateral Lower Leg o Increase protein intake. Wound #2 Right,Distal,Lateral Lower Leg o Increase protein intake. Electronic Signature(s) Signed: 05/28/2015 4:13:42 PM By: [name] MD, FACS Signed: 05/28/2015 4:38:13 PM By: Alric Quan Entered By: Alric Quan on 05/28/2015 14:35:44 [name] ([id number]) -------------------------------------------------------------------------------- Problem List Details Patient Name: [name], [name] 05/28/2015 2:15 Date of Service: PM Medical Record [id number] Number: Patient Account Number: [id number] [birth year] (82 y.o. Treating RN: [name] Date of Birth/Sex: Female) Other Clinician: Primary Care Physician: [name] Treating [name] Referring Physician: [name] Physician/Extender: [name] in Treatment: 1 Active Problems ICD-10 Encounter Code Description Active Date Diagnosis I89.0 Lymphedema, not elsewhere  classified 05/21/2015 Yes L97.212 Non-pressure chronic ulcer of right calf with fat layer 05/21/2015 Yes exposed Inactive Problems Resolved Problems Electronic Signature(s) Signed: 05/28/2015 2:37:59 PM By: [name] MD, FACS Entered By: [name] on 05/28/2015 14:37:58 [name] ([id number]) -------------------------------------------------------------------------------- Progress Note Details Patient Name: [name] 05/28/2015 2:15 Date of Service: PM Medical Record [id number] Number: Patient Account Number: [id number] Jun 03, 1928 (82 y.o. Treating RN: [name] Date of Birth/Sex: Female) Other Clinician: Primary Care Physician: [name] Treating [name] Referring Physician: [name] Physician/Extender: [name] in Treatment: 1 Subjective Chief Complaint Information obtained from Patient Patient presents to the wound care center for a consult due non healing wound which she's had for about 2 months History of Present Illness (HPI) The following HPI elements were documented for the patient's wound: Location: right lateral calf area Quality: Patient reports No Pain. Severity: Patient states wound are getting worse. Duration: Patient  has had the wound for > 2 months prior to seeking treatment at the wound center Context: The wound occurred when the patient injured her leg and bruised it against a blunt object Modifying Factors: Consults to this date include:local care and no antibiotics Associated Signs and Symptoms: Patient reports having increase swelling. 82 year old patient who had a blunt injury to her right lower extremity about a few weeks ago now noticed that the wound had opened out and started draining fluid. She is known to have chronic lower extremity edema which has been there for several years. The PCP feels that the edema has not related to anemia or CHF. The patient's past medical history significant for adenocarcinoma of the  colon status post resection, bowel obstruction with adhesions, essential hypertension, vertigo,chronic back pain, status post cholecystectomy, hysterectomy, appendectomy, colon surgery, bowel adhesion resection, hemorrhoid surgery. She has never been a smoker. Objective Constitutional Pulse regular. Respirations normal and unlabored. Afebrile. Vitals Time Taken: 2:20 PM, Height: 61 in, Weight: 142 lbs, BMI: 26.8, Temperature: 97.6 F, Pulse: 61 bpm, Respiratory Rate: 20 breaths/min, Blood Pressure: 139/42 mmHg. [name], [name] ([id number]) Eyes Nonicteric. Reactive to light. Ears, Nose, Mouth, and Throat Lips, teeth, and gums WNL.Marland Kitchen Moist mucosa without lesions. Neck supple and nontender. No palpable supraclavicular or cervical adenopathy. Normal sized without goiter. Respiratory WNL. No retractions.. Cardiovascular Pedal Pulses WNL. No clubbing, cyanosis or edema. Lymphatic No adneopathy. No adenopathy. No adenopathy. Musculoskeletal Adexa without tenderness or enlargement.. Digits and nails w/o clubbing, cyanosis, infection, petechiae, ischemia, or inflammatory conditions.Marland Kitchen Psychiatric Judgement and insight Intact.. No evidence of depression, anxiety, or agitation.. General Notes: the wound looks much better and has superficial debris which was washed out with moist saline gauze. Brisk bleeding was controlled with pressure. The edema has gone down remarkably. Integumentary (Hair, Skin) No suspicious lesions. No crepitus or fluctuance. No peri-wound warmth or erythema. No masses.. Wound #1 status is Open. Original cause of wound was Trauma. The wound is located on the Right,Proximal,Lateral Lower Leg. The wound measures 3.2cm length x 1.2cm width x 0.1cm depth; 3.016cm^2 area and 0.302cm^3 volume. The wound is limited to skin breakdown. There is no tunneling or undermining noted. There is a large amount of serosanguineous drainage noted. The wound margin is flat and intact. There  is medium (34-66%) pink granulation within the wound bed. There is a medium (34-66%) amount of necrotic tissue within the wound bed including Adherent Slough. The periwound skin appearance exhibited: Localized Edema, Moist. Periwound temperature was noted as No Abnormality. The periwound has tenderness on palpation. Wound #2 status is Open. Original cause of wound was Trauma. The wound is located on the Right,Distal,Lateral Lower Leg. The wound measures 0.8cm length x 0.3cm width x 0.1cm depth; 0.188cm^2 area and 0.019cm^3 volume. The wound is limited to skin breakdown. There is no tunneling or undermining noted. There is a large amount of serosanguineous drainage noted. The wound margin is flat and intact. There is large (67-100%) red, pink granulation within the wound bed. There is a small (1-33%) amount of necrotic tissue within the wound bed including Adherent Slough. The periwound skin appearance exhibited: Localized Edema. The periwound skin appearance did not exhibit: Moist. Periwound temperature [name], [name] ([id number]) was noted as No Abnormality. The periwound has tenderness on palpation. Assessment Active Problems ICD-10 I89.0 - Lymphedema, not elsewhere classified L97.212 - Non-pressure chronic ulcer of right calf with fat layer exposed Plan Wound Cleansing: Wound #1 Right,Proximal,Lateral Lower Leg: Clean wound with Normal Saline. Cleanse wound  with mild soap and water May Shower, gently pat wound dry prior to applying new dressing. Wound #2 Right,Distal,Lateral Lower Leg: Clean wound with Normal Saline. Cleanse wound with mild soap and water May Shower, gently pat wound dry prior to applying new dressing. Anesthetic: Wound #1 Right,Proximal,Lateral Lower Leg: Topical Lidocaine 4% cream applied to wound bed prior to debridement Wound #2 Right,Distal,Lateral Lower Leg: Topical Lidocaine 4% cream applied to wound bed prior to debridement Skin Barriers/Peri-Wound  Care: Wound #1 Right,Proximal,Lateral Lower Leg: Skin Prep Wound #2 Right,Distal,Lateral Lower Leg: Skin Prep Primary Wound Dressing: Wound #1 Right,Proximal,Lateral Lower Leg: Prisma Ag - moisten with saline Wound #2 Right,Distal,Lateral Lower Leg: Prisma Ag - moisten with saline Secondary Dressing: Wound #1 Right,Proximal,Lateral Lower Leg: Boardered Foam Dressing Wound #2 Right,Distal,Lateral Lower Leg: Boardered Foam Dressing Dressing Change Frequency: [name], [name] ([id number]) Wound #1 Right,Proximal,Lateral Lower Leg: Change dressing every other day. Wound #2 Right,Distal,Lateral Lower Leg: Change dressing every other day. Follow-up Appointments: Wound #1 Right,Proximal,Lateral Lower Leg: Return Appointment in 1 week. Wound #2 Right,Distal,Lateral Lower Leg: Return Appointment in 1 week. Edema Control: Wound #1 Right,Proximal,Lateral Lower Leg: Elevate legs to the level of the heart and pump ankles as often as possible Other: - get and wear compression stockings Wound #2 Right,Distal,Lateral Lower Leg: Elevate legs to the level of the heart and pump ankles as often as possible Other: - get and wear compression stockings Additional Orders / Instructions: Wound #1 Right,Proximal,Lateral Lower Leg: Increase protein intake. Wound #2 Right,Distal,Lateral Lower Leg: Increase protein intake. I have recommended: 1. Silver collagen to be applied every other day and covered with a bordered foam. 2. Compression stockings of the 20-30 mm variety and this can be applied all day except for bedtime 3. Elevation and exercise 4. Regular visits to the wound center She and her husband have had all questions answered and they will be compliant Electronic Signature(s) Signed: 05/28/2015 2:40:18 PM By: [name] MD, FACS Entered By: [name] on 05/28/2015 14:40:18 [name]  ([id number]) -------------------------------------------------------------------------------- SuperBill Details Patient Name: [name] Date of Service: 05/28/2015 Medical Record Number: [id number] Patient Account Number: [id number] Date of Birth/Sex: 11-Jul-1928 (82 y.o. Female) Treating RN: Carolyne Fiscal, Debi Primary Care Physician: [name] Other Clinician: Referring Physician: [name] Treating Physician/Extender: Frann Rider in Treatment: 1 Diagnosis Coding ICD-10 Codes Code Description I89.0 Lymphedema, not elsewhere classified L97.212 Non-pressure chronic ulcer of right calf with fat layer exposed Facility Procedures CPT4 Code: [id number] Description: [facility] VISIT-LEV 3 EST PT Modifier: Quantity: 1 Physician Procedures CPT4 Code: [id number] Description: [id number] - WC PHYS LEVEL 3 - EST PT ICD-10 Description Diagnosis I89.0 Lymphedema, not elsewhere classified L97.212 Non-pressure chronic ulcer of right calf with fat Modifier: layer exposed Quantity: 1 Electronic Signature(s) Signed: 05/28/2015 4:13:42 PM By: [name] MD, FACS Signed: 05/28/2015 4:38:13 PM By: Alric Quan Previous Signature: 05/28/2015 2:40:40 PM Version By: [name] MD, FACS Entered By: Alric Quan on 05/28/2015 15:06:39

## 2015-06-01 DIAGNOSIS — M5416 Radiculopathy, lumbar region: Secondary | ICD-10-CM | POA: Diagnosis not present

## 2015-06-01 DIAGNOSIS — R262 Difficulty in walking, not elsewhere classified: Secondary | ICD-10-CM | POA: Diagnosis not present

## 2015-06-01 DIAGNOSIS — M545 Low back pain: Secondary | ICD-10-CM | POA: Diagnosis not present

## 2015-06-03 DIAGNOSIS — M545 Low back pain: Secondary | ICD-10-CM | POA: Diagnosis not present

## 2015-06-03 DIAGNOSIS — M5416 Radiculopathy, lumbar region: Secondary | ICD-10-CM | POA: Diagnosis not present

## 2015-06-03 DIAGNOSIS — R262 Difficulty in walking, not elsewhere classified: Secondary | ICD-10-CM | POA: Diagnosis not present

## 2015-06-04 ENCOUNTER — Encounter: Payer: Commercial Managed Care - HMO | Admitting: Surgery

## 2015-06-04 DIAGNOSIS — Z85038 Personal history of other malignant neoplasm of large intestine: Secondary | ICD-10-CM | POA: Diagnosis not present

## 2015-06-04 DIAGNOSIS — K219 Gastro-esophageal reflux disease without esophagitis: Secondary | ICD-10-CM | POA: Diagnosis not present

## 2015-06-04 DIAGNOSIS — M549 Dorsalgia, unspecified: Secondary | ICD-10-CM | POA: Diagnosis not present

## 2015-06-04 DIAGNOSIS — G8929 Other chronic pain: Secondary | ICD-10-CM | POA: Diagnosis not present

## 2015-06-04 DIAGNOSIS — D649 Anemia, unspecified: Secondary | ICD-10-CM | POA: Diagnosis not present

## 2015-06-04 DIAGNOSIS — L97212 Non-pressure chronic ulcer of right calf with fat layer exposed: Secondary | ICD-10-CM | POA: Diagnosis not present

## 2015-06-04 DIAGNOSIS — I890 Lymphedema, not elsewhere classified: Secondary | ICD-10-CM | POA: Diagnosis not present

## 2015-06-04 DIAGNOSIS — M1990 Unspecified osteoarthritis, unspecified site: Secondary | ICD-10-CM | POA: Diagnosis not present

## 2015-06-04 DIAGNOSIS — I10 Essential (primary) hypertension: Secondary | ICD-10-CM | POA: Diagnosis not present

## 2015-06-04 DIAGNOSIS — S81801A Unspecified open wound, right lower leg, initial encounter: Secondary | ICD-10-CM | POA: Diagnosis not present

## 2015-06-06 NOTE — Progress Notes (Signed)
Katrina, [name] ([id number]) Visit Report for 06/04/2015 Chief Complaint Document Details Patient Name: [name] 06/04/2015 10:45 Date of Service: AM Medical Record [id number] Number: Patient Account Number: [id number] 1929/01/03 (82 y.o. Treating RN: [name] Date of Birth/Sex: Female) Other Clinician: Primary Care Physician: [name] Treating [name] Referring Physician: [name] Physician/Extender: [name] in Treatment: 2 Information Obtained from: Patient Chief Complaint Patient presents to the wound care center for a consult due non healing wound which she's had for about 2 months Electronic Signature(s) Signed: 06/04/2015 11:26:42 AM By: [name] MD, FACS Entered By: [name] on 06/04/2015 11:26:42 [name] ([id number]) -------------------------------------------------------------------------------- HPI Details Patient Name: [name] 06/04/2015 10:45 Date of Service: AM Medical Record [id number] Number: Patient Account Number: [id number] 1928-06-08 (82 y.o. Treating RN: [name] Date of Birth/Sex: Female) Other Clinician: Primary Care Physician: [name] Treating [name] Referring Physician: [name] Physician/Extender: Weeks in Treatment: 2 History of Present Illness Location: right lateral calf area Quality: Patient reports No Pain. Severity: Patient states wound are getting worse. Duration: Patient has had the wound for > 2 months prior to seeking treatment at the wound center Context: The wound occurred when the patient injured her leg and bruised it against a blunt object Modifying Factors: Consults to this date include:local care and no antibiotics Associated Signs and Symptoms: Patient reports having increase swelling. HPI Description: 82 year old patient who had a blunt injury to her right lower extremity about a few weeks ago now noticed that the wound had opened out and started  draining fluid. She is known to have chronic lower extremity edema which has been there for several years. The PCP feels that the edema has not related to anemia or CHF. The patient's past medical history significant for adenocarcinoma of the colon status post resection, bowel obstruction with adhesions, essential hypertension, vertigo,chronic back pain, status post cholecystectomy, hysterectomy, appendectomy, colon surgery, bowel adhesion resection, hemorrhoid surgery. She has never been a smoker. Electronic Signature(s) Signed: 06/04/2015 11:26:52 AM By: [name] MD, FACS Entered By: [name] on 06/04/2015 11:26:51 [name] ([id number]) -------------------------------------------------------------------------------- Physical Exam Details Patient Name: Katrina, [name] 06/04/2015 10:45 Date of Service: AM Medical Record [id number] Number: Patient Account Number: [id number] Dec 23, 1928 (82 y.o. Treating RN: [name] Date of Birth/Sex: Female) Other Clinician: Primary Care Physician: [name] Treating [name] Referring Physician: [name] Physician/Extender: Weeks in Treatment: 2 Constitutional . Pulse regular. Respirations normal and unlabored. Afebrile. . Eyes Nonicteric. Reactive to light. Ears, Nose, Mouth, and Throat Lips, teeth, and gums WNL.Marland Kitchen Moist mucosa without lesions. Neck supple and nontender. No palpable supraclavicular or cervical adenopathy. Normal sized without goiter. Respiratory WNL. No retractions.. Breath sounds WNL, No rubs, rales, rhonchi, or wheeze.. Cardiovascular Heart rhythm and rate regular, no murmur or gallop.. Pedal Pulses WNL. No clubbing, cyanosis or edema. Lymphatic No adneopathy. No adenopathy. No adenopathy. Musculoskeletal Adexa without tenderness or enlargement.. Digits and nails w/o clubbing, cyanosis, infection, petechiae, ischemia, or inflammatory conditions.. Integumentary (Hair, Skin) No  suspicious lesions. No crepitus or fluctuance. No peri-wound warmth or erythema. No masses.Marland Kitchen Psychiatric Judgement and insight Intact.. No evidence of depression, anxiety, or agitation.. Notes the wound on the right lower extremity is looking very good the edema is minimal and there is good epithelialization from the sides. The granulation tissue is healthy. No debridement was required today. Electronic Signature(s) Signed: 06/04/2015 11:27:50 AM By: [name] MD, FACS Entered By: [name] on 06/04/2015 11:27:49 [name] ([id number]) --------------------------------------------------------------------------------  Physician Orders Details Patient Name: [name], [name] 06/04/2015 10:45 Date of Service: AM Medical Record [id number] Number: Patient Account Number: [id number] 11/14/1928 (82 y.o. Treating RN: [name] Date of Birth/Sex: Female) Other Clinician: Primary Care Physician: [name] Treating [name] Referring Physician: [name] Physician/Extender: [name] in Treatment: 2 Verbal / Phone Orders: Yes Clinician: Pinkerton, Debi Read Back and Verified: Yes Diagnosis Coding Wound Cleansing Wound #1 Right,Proximal,Lateral Lower Leg o Clean wound with Normal Saline. o Cleanse wound with mild soap and water o May Shower, gently pat wound dry prior to applying new dressing. Wound #2 Right,Distal,Lateral Lower Leg o Clean wound with Normal Saline. o Cleanse wound with mild soap and water o May Shower, gently pat wound dry prior to applying new dressing. Anesthetic Wound #1 Right,Proximal,Lateral Lower Leg o Topical Lidocaine 4% cream applied to wound bed prior to debridement Wound #2 Right,Distal,Lateral Lower Leg o Topical Lidocaine 4% cream applied to wound bed prior to debridement Skin Barriers/Peri-Wound Care Wound #1 Right,Proximal,Lateral Lower Leg o Skin Prep Wound #2 Right,Distal,Lateral Lower Leg o Skin  Prep Primary Wound Dressing Wound #1 Right,Proximal,Lateral Lower Leg o Prisma Ag - moisten with saline Wound #2 Right,Distal,Lateral Lower Leg o Prisma Ag - moisten with saline Secondary Dressing Wound #1 Right,Proximal,Lateral Lower Leg Remmers, Mischa N. ([id number]) o Boardered Foam Dressing Wound #2 Right,Distal,Lateral Lower Leg o Boardered Foam Dressing Dressing Change Frequency Wound #1 Right,Proximal,Lateral Lower Leg o Change dressing every other day. Wound #2 Right,Distal,Lateral Lower Leg o Change dressing every other day. Follow-up Appointments Wound #1 Right,Proximal,Lateral Lower Leg o Return Appointment in 1 week. Wound #2 Right,Distal,Lateral Lower Leg o Return Appointment in 1 week. Edema Control Wound #1 Right,Proximal,Lateral Lower Leg o Elevate legs to the level of the heart and pump ankles as often as possible o Other: - get and wear compression stockings Wound #2 Right,Distal,Lateral Lower Leg o Elevate legs to the level of the heart and pump ankles as often as possible o Other: - get and wear compression stockings Additional Orders / Instructions Wound #1 Right,Proximal,Lateral Lower Leg o Increase protein intake. Wound #2 Right,Distal,Lateral Lower Leg o Increase protein intake. Electronic Signature(s) Signed: 06/04/2015 4:14:45 PM By: [name] MD, FACS Signed: 06/05/2015 5:23:57 PM By: Alric Quan Entered By: Alric Quan on 06/04/2015 11:24:07 [name] ([id number]) -------------------------------------------------------------------------------- Problem List Details Patient Name: RYN, OGRADY 06/04/2015 10:45 Date of Service: AM Medical Record [id number] Number: Patient Account Number: [id number] Mar 16, 1928 (82 y.o. Treating RN: [name] Date of Birth/Sex: Female) Other Clinician: Primary Care Physician: [name] Treating [name] Referring Physician: [name] Physician/Extender: [name] in Treatment: 2 Active Problems ICD-10 Encounter Code Description Active Date Diagnosis I89.0 Lymphedema, not elsewhere classified 05/21/2015 Yes L97.212 Non-pressure chronic ulcer of right calf with fat layer 05/21/2015 Yes exposed Inactive Problems Resolved Problems Electronic Signature(s) Signed: 06/04/2015 11:26:36 AM By: [name] MD, FACS Entered By: [name] on 06/04/2015 11:26:36 [name] ([id number]) -------------------------------------------------------------------------------- Progress Note Details Patient Name: [name] 06/04/2015 10:45 Date of Service: AM Medical Record [id number] Number: Patient Account Number: [id number] 12-Apr-1928 (82 y.o. Treating RN: [name] Date of Birth/Sex: Female) Other Clinician: Primary Care Physician: [name] Treating [name] Referring Physician: [name] Physician/Extender: [name] in Treatment: 2 Subjective Chief Complaint Information obtained from Patient Patient presents to the wound care center for a consult due non healing wound which she's had for about 2 months History of Present Illness (HPI) The following HPI elements were documented for the  patient's wound: Location: right lateral calf area Quality: Patient reports No Pain. Severity: Patient states wound are getting worse. Duration: Patient has had the wound for > 2 months prior to seeking treatment at the wound center Context: The wound occurred when the patient injured her leg and bruised it against a blunt object Modifying Factors: Consults to this date include:local care and no antibiotics Associated Signs and Symptoms: Patient reports having increase swelling. 82 year old patient who had a blunt injury to her right lower extremity about a few weeks ago now noticed that the wound had opened out and started draining fluid. She is known to have chronic lower extremity edema which has  been there for several years. The PCP feels that the edema has not related to anemia or CHF. The patient's past medical history significant for adenocarcinoma of the colon status post resection, bowel obstruction with adhesions, essential hypertension, vertigo,chronic back pain, status post cholecystectomy, hysterectomy, appendectomy, colon surgery, bowel adhesion resection, hemorrhoid surgery. She has never been a smoker. Objective Constitutional Pulse regular. Respirations normal and unlabored. Afebrile. Vitals Time Taken: 11:00 AM, Height: 61 in, Weight: 142 lbs, BMI: 26.8, Temperature: 98.1 F, Pulse: 68 bpm, Respiratory Rate: 20 breaths/min, Blood Pressure: 131/59 mmHg. ADYSYN, DELACUEVA N. ([id number]) Eyes Nonicteric. Reactive to light. Ears, Nose, Mouth, and Throat Lips, teeth, and gums WNL.Marland Kitchen Moist mucosa without lesions. Neck supple and nontender. No palpable supraclavicular or cervical adenopathy. Normal sized without goiter. Respiratory WNL. No retractions.. Breath sounds WNL, No rubs, rales, rhonchi, or wheeze.. Cardiovascular Heart rhythm and rate regular, no murmur or gallop.. Pedal Pulses WNL. No clubbing, cyanosis or edema. Lymphatic No adneopathy. No adenopathy. No adenopathy. Musculoskeletal Adexa without tenderness or enlargement.. Digits and nails w/o clubbing, cyanosis, infection, petechiae, ischemia, or inflammatory conditions.Marland Kitchen Psychiatric Judgement and insight Intact.. No evidence of depression, anxiety, or agitation.. General Notes: the wound on the right lower extremity is looking very good the edema is minimal and there is good epithelialization from the sides. The granulation tissue is healthy. No debridement was required today. Integumentary (Hair, Skin) No suspicious lesions. No crepitus or fluctuance. No peri-wound warmth or erythema. No masses.. Wound #1 status is Open. Original cause of wound was Trauma. The wound is located on the Right,Proximal,Lateral  Lower Leg. The wound measures 3cm length x 1.2cm width x 0.1cm depth; 2.827cm^2 area and 0.283cm^3 volume. The wound is limited to skin breakdown. There is no tunneling or undermining noted. There is a large amount of serosanguineous drainage noted. The wound margin is flat and intact. There is large (67-100%) pink granulation within the wound bed. There is a small (1-33%) amount of necrotic tissue within the wound bed including Adherent Slough. The periwound skin appearance exhibited: Localized Edema, Moist. Periwound temperature was noted as No Abnormality. The periwound has tenderness on palpation. Wound #2 status is Open. Original cause of wound was Trauma. The wound is located on the Right,Distal,Lateral Lower Leg. The wound measures 0.5cm length x 0.2cm width x 0.1cm depth; 0.079cm^2 area and 0.008cm^3 volume. The wound is limited to skin breakdown. There is no tunneling or undermining noted. There is a large amount of serosanguineous drainage noted. The wound margin is flat and intact. There is large (67-100%) red, pink granulation within the wound bed. There is no necrotic tissue within the wound bed. The periwound skin appearance exhibited: Localized Edema. The periwound skin appearance did not exhibit: Moist. Periwound temperature was noted as No Abnormality. The periwound Ritacco, Bellatrix N. ([id number]) has tenderness on palpation. Assessment  Active Problems ICD-10 I89.0 - Lymphedema, not elsewhere classified L97.212 - Non-pressure chronic ulcer of right calf with fat layer exposed The patient has made a very good recovery and she is doing everything she can with elevation and exercise and has been very compliant with the dressing changes. I will continue silver collagen and her compressions stockings and see her back next week. Plan Wound Cleansing: Wound #1 Right,Proximal,Lateral Lower Leg: Clean wound with Normal Saline. Cleanse wound with mild soap and water May Shower, gently  pat wound dry prior to applying new dressing. Wound #2 Right,Distal,Lateral Lower Leg: Clean wound with Normal Saline. Cleanse wound with mild soap and water May Shower, gently pat wound dry prior to applying new dressing. Anesthetic: Wound #1 Right,Proximal,Lateral Lower Leg: Topical Lidocaine 4% cream applied to wound bed prior to debridement Wound #2 Right,Distal,Lateral Lower Leg: Topical Lidocaine 4% cream applied to wound bed prior to debridement Skin Barriers/Peri-Wound Care: Wound #1 Right,Proximal,Lateral Lower Leg: Skin Prep Wound #2 Right,Distal,Lateral Lower Leg: Skin Prep Primary Wound Dressing: Wound #1 Right,Proximal,Lateral Lower Leg: Prisma Ag - moisten with saline Wound #2 Right,Distal,Lateral Lower Leg: Prisma Ag - moisten with saline Secondary Dressing: ELIA, KRATKY N. ([id number]) Wound #1 Right,Proximal,Lateral Lower Leg: Boardered Foam Dressing Wound #2 Right,Distal,Lateral Lower Leg: Boardered Foam Dressing Dressing Change Frequency: Wound #1 Right,Proximal,Lateral Lower Leg: Change dressing every other day. Wound #2 Right,Distal,Lateral Lower Leg: Change dressing every other day. Follow-up Appointments: Wound #1 Right,Proximal,Lateral Lower Leg: Return Appointment in 1 week. Wound #2 Right,Distal,Lateral Lower Leg: Return Appointment in 1 week. Edema Control: Wound #1 Right,Proximal,Lateral Lower Leg: Elevate legs to the level of the heart and pump ankles as often as possible Other: - get and wear compression stockings Wound #2 Right,Distal,Lateral Lower Leg: Elevate legs to the level of the heart and pump ankles as often as possible Other: - get and wear compression stockings Additional Orders / Instructions: Wound #1 Right,Proximal,Lateral Lower Leg: Increase protein intake. Wound #2 Right,Distal,Lateral Lower Leg: Increase protein intake. The patient has made a very good recovery and she is doing everything she can with elevation  and exercise and has been very compliant with the dressing changes. I will continue silver collagen and her compressions stockings and see her back next week. Electronic Signature(s) Signed: 06/04/2015 11:28:44 AM By: [name] MD, FACS Entered By: [name] on 06/04/2015 11:28:43 [name] ([id number]) -------------------------------------------------------------------------------- SuperBill Details Patient Name: [name] Date of Service: 06/04/2015 Medical Record Number: [id number] Patient Account Number: [id number] Date of Birth/Sex: 27-Jun-1928 (82 y.o. Female) Treating RN: Carolyne Fiscal, Debi Primary Care Physician: [name] Other Clinician: Referring Physician: [name] Treating Physician/Extender: Frann Rider in Treatment: 2 Diagnosis Coding ICD-10 Codes Code Description I89.0 Lymphedema, not elsewhere classified L97.212 Non-pressure chronic ulcer of right calf with fat layer exposed Facility Procedures CPT4 Code: [id number] Description: [place] VISIT-LEV 3 EST PT Modifier: Quantity: 1 Physician Procedures CPT4 Code: [id number] Description: [id number] - WC PHYS LEVEL 3 - EST PT ICD-10 Description Diagnosis I89.0 Lymphedema, not elsewhere classified L97.212 Non-pressure chronic ulcer of right calf with fat Modifier: layer exposed Quantity: 1 Electronic Signature(s) Signed: 06/04/2015 4:14:45 PM By: [name] MD, FACS Signed: 06/05/2015 5:23:57 PM By: Alric Quan Previous Signature: 06/04/2015 11:29:24 AM Version By: [name] MD, FACS Previous Signature: 06/04/2015 11:28:57 AM Version By: [name] MD, FACS Entered By: Alric Quan on 06/04/2015 11:35:01

## 2015-06-06 NOTE — Progress Notes (Signed)
NYIMA, VANACKER ([id number]) Visit Report for 06/04/2015 Arrival Information Details Patient Name: [name], [name] Date of Service: 06/04/2015 10:45 AM Medical Record Number: [id number] Patient Account Number: [id number] Date of Birth/Sex: 1928/11/08 (82 y.o. Female) Treating RN: Carolyne Fiscal, Debi Primary Care Physician: [name] Other Clinician: Referring Physician: [name] Treating Physician/Extender: Frann Rider in Treatment: 2 Visit Information History Since Last Visit All ordered tests and consults were completed: No Patient Arrived: Gilford Rile Added or deleted any medications: No Arrival Time: 10:59 Any new allergies or adverse reactions: No Accompanied By: husband Had a fall or experienced change in No Transfer Assistance: EasyPivot activities of daily living that may affect Patient Lift risk of falls: Patient Identification Verified: Yes Signs or symptoms of abuse/neglect since last No Secondary Verification Process Yes visito Completed: Hospitalized since last visit: No Patient Requires Transmission- No Pain Present Now: No Based Precautions: Patient Has Alerts: No Electronic Signature(s) Signed: 06/05/2015 5:23:57 PM By: Alric Quan Entered By: Alric Quan on 06/04/2015 10:59:51 [name] ([id number]) -------------------------------------------------------------------------------- Clinic Level of Care Assessment Details Patient Name: [name] Date of Service: 06/04/2015 10:45 AM Medical Record Number: [id number] Patient Account Number: [id number] Date of Birth/Sex: [birth year] (82 y.o. Female) Treating RN: Carolyne Fiscal, Debi Primary Care Physician: [name] Other Clinician: Referring Physician: [name] Treating Physician/Extender: Frann Rider in Treatment: 2 Clinic Level of Care Assessment Items TOOL 4 Quantity Score X - Use when only an EandM is performed on FOLLOW-UP visit 1 0 ASSESSMENTS - Nursing  Assessment / Reassessment X - Reassessment of Co-morbidities (includes updates in patient status) 1 10 X - Reassessment of Adherence to Treatment Plan 1 5 ASSESSMENTS - Wound and Skin Assessment / Reassessment []  - Simple Wound Assessment / Reassessment - one wound 0 X - Complex Wound Assessment / Reassessment - multiple wounds 2 5 []  - Dermatologic / Skin Assessment (not related to wound area) 0 ASSESSMENTS - Focused Assessment []  - Circumferential Edema Measurements - multi extremities 0 []  - Nutritional Assessment / Counseling / Intervention 0 []  - Lower Extremity Assessment (monofilament, tuning fork, pulses) 0 []  - Peripheral Arterial Disease Assessment (using hand held doppler) 0 ASSESSMENTS - Ostomy and/or Continence Assessment and Care []  - Incontinence Assessment and Management 0 []  - Ostomy Care Assessment and Management (repouching, etc.) 0 PROCESS - Coordination of Care X - Simple Patient / Family Education for ongoing care 1 15 []  - Complex (extensive) Patient / Family Education for ongoing care 0 []  - Staff obtains Programmer, systems, Records, Test Results / Process Orders 0 []  - Staff telephones HHA, Nursing Homes / Clarify orders / etc 0 []  - Routine Transfer to another Facility (non-emergent condition) 0 Keesey, Keili N. ([id number]) []  - Routine Hospital Admission (non-emergent condition) 0 []  - New Admissions / Biomedical engineer / Ordering NPWT, Apligraf, etc. 0 []  - Emergency Hospital Admission (emergent condition) 0 X - Simple Discharge Coordination 1 10 []  - Complex (extensive) Discharge Coordination 0 PROCESS - Special Needs []  - Pediatric / Minor Patient Management 0 []  - Isolation Patient Management 0 []  - Hearing / Language / Visual special needs 0 []  - Assessment of Community assistance (transportation, D/C planning, etc.) 0 []  - Additional assistance / Altered mentation 0 []  - Support Surface(s) Assessment (bed, cushion, seat, etc.) 0 INTERVENTIONS - Wound  Cleansing / Measurement []  - Simple Wound Cleansing - one wound 0 X - Complex Wound Cleansing - multiple wounds 2 5 X - Wound Imaging (photographs - any number of wounds)  1 5 []  - Wound Tracing (instead of photographs) 0 []  - Simple Wound Measurement - one wound 0 X - Complex Wound Measurement - multiple wounds 2 5 INTERVENTIONS - Wound Dressings X - Small Wound Dressing one or multiple wounds 2 10 []  - Medium Wound Dressing one or multiple wounds 0 []  - Large Wound Dressing one or multiple wounds 0 []  - Application of Medications - topical 0 []  - Application of Medications - injection 0 INTERVENTIONS - Miscellaneous []  - External ear exam 0 Formby, Alfretta N. ([id number]) []  - Specimen Collection (cultures, biopsies, blood, body fluids, etc.) 0 []  - Specimen(s) / Culture(s) sent or taken to Lab for analysis 0 []  - Patient Transfer (multiple staff / Harrel Lemon Lift / Similar devices) 0 []  - Simple Staple / Suture removal (25 or less) 0 []  - Complex Staple / Suture removal (26 or more) 0 []  - Hypo / Hyperglycemic Management (close monitor of Blood Glucose) 0 []  - Ankle / Brachial Index (ABI) - do not check if billed separately 0 X - Vital Signs 1 5 Has the patient been seen at the hospital within the last three years: Yes Total Score: 100 Level Of Care: New/Established - Level 3 Electronic Signature(s) Signed: 06/05/2015 5:23:57 PM By: Alric Quan Entered By: Alric Quan on 06/04/2015 11:34:46 [name] ([id number]) -------------------------------------------------------------------------------- Encounter Discharge Information Details Patient Name: [name] Date of Service: 06/04/2015 10:45 AM Medical Record Number: [id number] Patient Account Number: [id number] Date of Birth/Sex: 02-Dec-1928 (82 y.o. Female) Treating RN: Carolyne Fiscal, Debi Primary Care Physician: [name] Other Clinician: Referring Physician: [name] Treating Physician/Extender: Frann Rider in Treatment: 2 Encounter Discharge Information Items Discharge Pain Level: 0 Discharge Condition: Stable Ambulatory Status: Walker Discharge Destination: Home Transportation: Private Auto Accompanied By: husband Schedule Follow-up Appointment: Yes Medication Reconciliation completed Yes and provided to Patient/Care Tajon Moring: Provided on Clinical Summary of Care: 06/04/2015 Form Type Recipient Paper Patient [facility] Electronic Signature(s) Signed: 06/04/2015 11:36:11 AM By: Ruthine Dose Entered By: Ruthine Dose on 06/04/2015 11:36:11 [name] ([id number]) -------------------------------------------------------------------------------- Lower Extremity Assessment Details Patient Name: [name] Date of Service: 06/04/2015 10:45 AM Medical Record Number: [id number] Patient Account Number: [id number] Date of Birth/Sex: [birth year] (82 y.o. Female) Treating RN: Carolyne Fiscal, Debi Primary Care Physician: [name] Other Clinician: Referring Physician: [name] Treating Physician/Extender: Frann Rider in Treatment: 2 Vascular Assessment Pulses: Posterior Tibial Dorsalis Pedis Palpable: [Right:Yes] Extremity colors, hair growth, and conditions: Extremity Color: [Right:Mottled] Temperature of Extremity: [Right:Warm] Capillary Refill: [Right:< 3 seconds] Toe Nail Assessment Left: Right: Thick: No Discolored: No Deformed: No Improper Length and Hygiene: No Electronic Signature(s) Signed: 06/05/2015 5:23:57 PM By: Alric Quan Entered By: Alric Quan on 06/04/2015 11:02:46 [name] ([id number]) -------------------------------------------------------------------------------- Multi Wound Chart Details Patient Name: [name] Date of Service: 06/04/2015 10:45 AM Medical Record Number: [id number] Patient Account Number: [id number] Date of Birth/Sex: 1928/06/28 (82 y.o. Female) Treating RN: Carolyne Fiscal, Debi Primary Care  Physician: [name] Other Clinician: Referring Physician: [name] Treating Physician/Extender: Frann Rider in Treatment: 2 Vital Signs Height(in): 61 Pulse(bpm): 68 Weight(lbs): 142 Blood Pressure 131/59 (mmHg): Body Mass Index(BMI): 27 Temperature(F): 98.1 Respiratory Rate 20 (breaths/min): Photos: [1:No Photos] [2:No Photos] [N/A:N/A] Wound Location: [1:Right Lower Leg - Lateral, Proximal] [2:Right Lower Leg - Lateral, Distal] [N/A:N/A] Wounding Event: [1:Trauma] [2:Trauma] [N/A:N/A] Primary Etiology: [1:Trauma, Other] [2:Trauma, Other] [N/A:N/A] Comorbid History: [1:Anemia, Hypertension, Osteoarthritis, Received Chemotherapy] [2:Anemia, Hypertension, Osteoarthritis, Received Chemotherapy] [N/A:N/A] Date Acquired: [1:04/20/2015] [2:04/20/2015] [N/A:N/A] Weeks of  Treatment: [1:2] [2:2] [N/A:N/A] Wound Status: [1:Open] [2:Open] [N/A:N/A] Measurements L x W x D 3x1.2x0.1 [2:0.5x0.2x0.1] [N/A:N/A] (cm) Area (cm) : [1:2.827] [2:0.079] [N/A:N/A] Volume (cm) : [1:0.283] [2:0.008] [N/A:N/A] % Reduction in Area: [1:46.40%] [2:74.80%] [N/A:N/A] % Reduction in Volume: 46.40% [2:74.20%] [N/A:N/A] Classification: [1:Partial Thickness] [2:Partial Thickness] [N/A:N/A] Exudate Amount: [1:Large] [2:Large] [N/A:N/A] Exudate Type: [1:Serosanguineous] [2:Serosanguineous] [N/A:N/A] Exudate Color: [1:red, brown] [2:red, brown] [N/A:N/A] Wound Margin: [1:Flat and Intact] [2:Flat and Intact] [N/A:N/A] Granulation Amount: [1:Large (67-100%)] [2:Large (67-100%)] [N/A:N/A] Granulation Quality: [1:Pink] [2:Red, Pink] [N/A:N/A] Necrotic Amount: [1:Small (1-33%)] [2:None Present (0%)] [N/A:N/A] Exposed Structures: [1:Fascia: No Fat: No Tendon: No Muscle: No Joint: No] [2:Fascia: No Fat: No Tendon: No Muscle: No Joint: No] [N/A:N/A] Bone: No Bone: No Limited to Skin Limited to Skin Breakdown Breakdown Epithelialization: None None N/A Periwound Skin Texture: Edema: Yes Edema:  Yes N/A Periwound Skin Moist: Yes Moist: No N/A Moisture: Periwound Skin Color: No Abnormalities Noted No Abnormalities Noted N/A Temperature: No Abnormality No Abnormality N/A Tenderness on Yes Yes N/A Palpation: Wound Preparation: Ulcer Cleansing: Ulcer Cleansing: N/A Rinsed/Irrigated with Rinsed/Irrigated with Saline Saline Topical Anesthetic Topical Anesthetic Applied: Other: lidocaine Applied: Other: lidocaine 4% 4% Treatment Notes Electronic Signature(s) Signed: 06/05/2015 5:23:57 PM By: Alric Quan Entered By: Alric Quan on 06/04/2015 11:11:03 [name] ([id number]) -------------------------------------------------------------------------------- [place] Details Patient Name: [name] Date of Service: 06/04/2015 10:45 AM Medical Record Number: [id number] Patient Account Number: [id number] Date of Birth/Sex: 11-Jun-1928 (82 y.o. Female) Treating RN: Carolyne Fiscal, Debi Primary Care Physician: [name] Other Clinician: Referring Physician: [name] Treating Physician/Extender: Frann Rider in Treatment: 2 Active Inactive Abuse / Safety / Falls / Self Care Management Nursing Diagnoses: Potential for falls Goals: Patient will remain injury free Date Initiated: 05/21/2015 Goal Status: Active Interventions: Assess fall risk on admission and as needed Notes: Nutrition Nursing Diagnoses: Imbalanced nutrition Goals: Patient/caregiver agrees to and verbalizes understanding of need to use nutritional supplements and/or vitamins as prescribed Date Initiated: 05/21/2015 Goal Status: Active Interventions: Assess patient nutrition upon admission and as needed per policy Notes: Orientation to the Wound Care Program Nursing Diagnoses: Knowledge deficit related to the wound healing center program Goals: Patient/caregiver will verbalize understanding of the [place], Vinnie N.  ([id number]) Date Initiated: 05/21/2015 Goal Status: Active Interventions: Provide education on orientation to the wound center Notes: Pain, Acute or Chronic Nursing Diagnoses: Pain, acute or chronic: actual or potential Potential alteration in comfort, pain Goals: Patient/caregiver will verbalize adequate pain control between visits Date Initiated: 05/21/2015 Goal Status: Active Patient/caregiver will verbalize comfort level met Date Initiated: 05/21/2015 Goal Status: Active Interventions: Complete pain assessment as per visit requirements Notes: Wound/Skin Impairment Nursing Diagnoses: Impaired tissue integrity Goals: Ulcer/skin breakdown will have a volume reduction of 30% by week 4 Date Initiated: 05/21/2015 Goal Status: Active Ulcer/skin breakdown will have a volume reduction of 50% by week 8 Date Initiated: 05/21/2015 Goal Status: Active Ulcer/skin breakdown will have a volume reduction of 80% by week 12 Date Initiated: 05/21/2015 Goal Status: Active Interventions: Assess patient/caregiver ability to obtain necessary supplies Assess ulceration(s) every visit HALEE, GLYNN ([id number]) Notes: Electronic Signature(s) Signed: 06/05/2015 5:23:57 PM By: Alric Quan Entered By: Alric Quan on 06/04/2015 11:10:36 [name] ([id number]) -------------------------------------------------------------------------------- Pain Assessment Details Patient Name: [name] Date of Service: 06/04/2015 10:45 AM Medical Record Number: [id number] Patient Account Number: [id number] Date of Birth/Sex: 06-Jun-1928 (82 y.o. Female) Treating RN: Carolyne Fiscal, Debi Primary Care Physician: [name] Other Clinician: Referring Physician:  [name] Treating Physician/Extender: Frann Rider in Treatment: 2 Active Problems Location of Pain Severity and Description of Pain Patient Has Paino No Site Locations Pain Management and Medication Current Pain  Management: Electronic Signature(s) Signed: 06/05/2015 5:23:57 PM By: Alric Quan Entered By: Alric Quan on 06/04/2015 10:59:58 [name] ([id number]) -------------------------------------------------------------------------------- Patient/Caregiver Education Details Patient Name: [name] Date of Service: 06/04/2015 10:45 AM Medical Record Number: [id number] Patient Account Number: [id number] Date of Birth/Gender: 1928/09/14 (82 y.o. Female) Treating RN: Carolyne Fiscal, Debi Primary Care Physician: [name] Other Clinician: Referring Physician: [name] Treating Physician/Extender: Frann Rider in Treatment: 2 Education Assessment Education Provided To: Patient Education Topics Provided Wound/Skin Impairment: Handouts: Other: change dressing as ordered Methods: Demonstration, Explain/Verbal Responses: State content correctly Electronic Signature(s) Signed: 06/05/2015 5:23:57 PM By: Alric Quan Entered By: Alric Quan on 06/04/2015 11:14:57 [name] ([id number]) -------------------------------------------------------------------------------- Wound Assessment Details Patient Name: [name] Date of Service: 06/04/2015 10:45 AM Medical Record Number: [id number] Patient Account Number: [id number] Date of Birth/Sex: June 11, 1928 (82 y.o. Female) Treating RN: Carolyne Fiscal, Debi Primary Care Physician: [name] Other Clinician: Referring Physician: [name] Treating Physician/Extender: Frann Rider in Treatment: 2 Wound Status Wound Number: 1 Primary Trauma, Other Etiology: Wound Location: Right Lower Leg - Lateral, Proximal Wound Open Status: Wounding Event: Trauma Comorbid Anemia, Hypertension, Osteoarthritis, Date Acquired: 04/20/2015 History: Received Chemotherapy Weeks Of Treatment: 2 Clustered Wound: No Photos Photo Uploaded By: Alric Quan on 06/04/2015 15:18:16 Wound  Measurements Length: (cm) 3 % Reduction in Width: (cm) 1.2 % Reduction in Depth: (cm) 0.1 Epithelializati Area: (cm) 2.827 Tunneling: Volume: (cm) 0.283 Undermining: Area: 46.4% Volume: 46.4% on: None No No Wound Description Classification: Partial Thickness Wound Margin: Flat and Intact Exudate Amount: Large Exudate Type: Serosanguineous Exudate Color: red, brown Foul Odor After Cleansing: No Wound Bed Granulation Amount: Large (67-100%) Exposed Structure Granulation Quality: Pink Fascia Exposed: No Necrotic Amount: Small (1-33%) Fat Layer Exposed: No Shipman, Windi N. ([id number]) Necrotic Quality: Adherent Slough Tendon Exposed: No Muscle Exposed: No Joint Exposed: No Bone Exposed: No Limited to Skin Breakdown Periwound Skin Texture Texture Color No Abnormalities Noted: No No Abnormalities Noted: No Localized Edema: Yes Temperature / Pain Moisture Temperature: No Abnormality No Abnormalities Noted: No Tenderness on Palpation: Yes Moist: Yes Wound Preparation Ulcer Cleansing: Rinsed/Irrigated with Saline Topical Anesthetic Applied: Other: lidocaine 4%, Treatment Notes Wound #1 (Right, Proximal, Lateral Lower Leg) 1. Cleansed with: Clean wound with Normal Saline 2. Anesthetic Topical Lidocaine 4% cream to wound bed prior to debridement 3. Peri-wound Care: Skin Prep 4. Dressing Applied: Prisma Ag 5. Secondary Dressing Applied Bordered Foam Dressing Electronic Signature(s) Signed: 06/05/2015 5:23:57 PM By: Alric Quan Entered By: Alric Quan on 06/04/2015 11:08:32 [name] ([id number]) -------------------------------------------------------------------------------- Wound Assessment Details Patient Name: [name] Date of Service: 06/04/2015 10:45 AM Medical Record Number: [id number] Patient Account Number: [id number] Date of Birth/Sex: 10-05-1928 (82 y.o. Female) Treating RN: Carolyne Fiscal, Debi Primary Care Physician: [name] Other Clinician: Referring Physician: [name] Treating Physician/Extender: Frann Rider in Treatment: 2 Wound Status Wound Number: 2 Primary Trauma, Other Etiology: Wound Location: Right Lower Leg - Lateral, Distal Wound Open Status: Wounding Event: Trauma Comorbid Anemia, Hypertension, Osteoarthritis, Date Acquired: 04/20/2015 History: Received Chemotherapy Weeks Of Treatment: 2 Clustered Wound: No Photos Photo Uploaded By: Alric Quan on 06/04/2015 15:18:17 Wound Measurements Length: (cm) 0.5 % Reduction in Width: (cm) 0.2 % Reduction in Depth: (cm) 0.1 Epithelializati Area: (cm) 0.079 Tunneling: Volume: (cm) 0.008 Undermining:  Area: 74.8% Volume: 74.2% on: None No No Wound Description Classification: Partial Thickness Wound Margin: Flat and Intact Exudate Amount: Large Exudate Type: Serosanguineous Exudate Color: red, brown Foul Odor After Cleansing: No Wound Bed Granulation Amount: Large (67-100%) Exposed Structure Granulation Quality: Red, Pink Fascia Exposed: No Necrotic Amount: None Present (0%) Fat Layer Exposed: No [place], Lesle N. ([id number]) Tendon Exposed: No Muscle Exposed: No Joint Exposed: No Bone Exposed: No Limited to Skin Breakdown Periwound Skin Texture Texture Color No Abnormalities Noted: No No Abnormalities Noted: No Localized Edema: Yes Temperature / Pain Moisture Temperature: No Abnormality No Abnormalities Noted: No Tenderness on Palpation: Yes Moist: No Wound Preparation Ulcer Cleansing: Rinsed/Irrigated with Saline Topical Anesthetic Applied: Other: lidocaine 4%, Treatment Notes Wound #2 (Right, Distal, Lateral Lower Leg) 1. Cleansed with: Clean wound with Normal Saline 2. Anesthetic Topical Lidocaine 4% cream to wound bed prior to debridement 3. Peri-wound Care: Skin Prep 4. Dressing Applied: Prisma Ag 5. Secondary Dressing Applied Bordered Foam Dressing Electronic  Signature(s) Signed: 06/05/2015 5:23:57 PM By: Alric Quan Entered By: Alric Quan on 06/04/2015 11:10:23 [name] ([id number]) -------------------------------------------------------------------------------- [place] Details Patient Name: [name] Date of Service: 06/04/2015 10:45 AM Medical Record Number: [id number] Patient Account Number: [id number] Date of Birth/Sex: [birth year] (82 y.o. Female) Treating RN: Carolyne Fiscal, Debi Primary Care Physician: [name] Other Clinician: Referring Physician: [name] Treating Physician/Extender: Frann Rider in Treatment: 2 Vital Signs Time Taken: 11:00 Temperature (F): 98.1 Height (in): 61 Pulse (bpm): 68 Weight (lbs): 142 Respiratory Rate (breaths/min): 20 Body Mass Index (BMI): 26.8 Blood Pressure (mmHg): 131/59 Reference Range: 80 - 120 mg / dl Electronic Signature(s) Signed: 06/05/2015 5:23:57 PM By: Alric Quan Entered By: Alric Quan on 06/04/2015 11:01:54

## 2015-06-11 ENCOUNTER — Encounter: Payer: Commercial Managed Care - HMO | Admitting: Surgery

## 2015-06-11 ENCOUNTER — Observation Stay
Admission: EM | Admit: 2015-06-11 | Discharge: 2015-06-14 | Disposition: A | Discharge status: Home / Self Care | Payer: Commercial Managed Care - HMO | Attending: Internal Medicine | Admitting: Internal Medicine

## 2015-06-11 ENCOUNTER — Emergency Department: Payer: Commercial Managed Care - HMO

## 2015-06-11 ENCOUNTER — Encounter: Payer: Self-pay | Admitting: Urgent Care

## 2015-06-11 ENCOUNTER — Other Ambulatory Visit: Payer: Self-pay

## 2015-06-11 DIAGNOSIS — Z79891 Long term (current) use of opiate analgesic: Secondary | ICD-10-CM | POA: Insufficient documentation

## 2015-06-11 DIAGNOSIS — N281 Cyst of kidney, acquired: Secondary | ICD-10-CM | POA: Insufficient documentation

## 2015-06-11 DIAGNOSIS — Z823 Family history of stroke: Secondary | ICD-10-CM | POA: Diagnosis not present

## 2015-06-11 DIAGNOSIS — M549 Dorsalgia, unspecified: Secondary | ICD-10-CM | POA: Diagnosis not present

## 2015-06-11 DIAGNOSIS — M8580 Other specified disorders of bone density and structure, unspecified site: Secondary | ICD-10-CM | POA: Diagnosis not present

## 2015-06-11 DIAGNOSIS — Z90710 Acquired absence of both cervix and uterus: Secondary | ICD-10-CM | POA: Insufficient documentation

## 2015-06-11 DIAGNOSIS — L97212 Non-pressure chronic ulcer of right calf with fat layer exposed: Secondary | ICD-10-CM | POA: Diagnosis not present

## 2015-06-11 DIAGNOSIS — E785 Hyperlipidemia, unspecified: Secondary | ICD-10-CM | POA: Insufficient documentation

## 2015-06-11 DIAGNOSIS — G8929 Other chronic pain: Secondary | ICD-10-CM | POA: Insufficient documentation

## 2015-06-11 DIAGNOSIS — K648 Other hemorrhoids: Secondary | ICD-10-CM | POA: Diagnosis not present

## 2015-06-11 DIAGNOSIS — Z9889 Other specified postprocedural states: Secondary | ICD-10-CM | POA: Insufficient documentation

## 2015-06-11 DIAGNOSIS — Z85038 Personal history of other malignant neoplasm of large intestine: Secondary | ICD-10-CM | POA: Diagnosis not present

## 2015-06-11 DIAGNOSIS — R109 Unspecified abdominal pain: Secondary | ICD-10-CM | POA: Diagnosis not present

## 2015-06-11 DIAGNOSIS — R112 Nausea with vomiting, unspecified: Secondary | ICD-10-CM | POA: Diagnosis not present

## 2015-06-11 DIAGNOSIS — E876 Hypokalemia: Secondary | ICD-10-CM | POA: Insufficient documentation

## 2015-06-11 DIAGNOSIS — Z791 Long term (current) use of non-steroidal anti-inflammatories (NSAID): Secondary | ICD-10-CM | POA: Insufficient documentation

## 2015-06-11 DIAGNOSIS — Z9221 Personal history of antineoplastic chemotherapy: Secondary | ICD-10-CM | POA: Diagnosis not present

## 2015-06-11 DIAGNOSIS — G629 Polyneuropathy, unspecified: Secondary | ICD-10-CM | POA: Insufficient documentation

## 2015-06-11 DIAGNOSIS — Z79899 Other long term (current) drug therapy: Secondary | ICD-10-CM | POA: Diagnosis not present

## 2015-06-11 DIAGNOSIS — I890 Lymphedema, not elsewhere classified: Secondary | ICD-10-CM | POA: Diagnosis not present

## 2015-06-11 DIAGNOSIS — M545 Low back pain: Secondary | ICD-10-CM | POA: Diagnosis not present

## 2015-06-11 DIAGNOSIS — I10 Essential (primary) hypertension: Secondary | ICD-10-CM | POA: Insufficient documentation

## 2015-06-11 DIAGNOSIS — Z833 Family history of diabetes mellitus: Secondary | ICD-10-CM | POA: Insufficient documentation

## 2015-06-11 DIAGNOSIS — Z803 Family history of malignant neoplasm of breast: Secondary | ICD-10-CM | POA: Diagnosis not present

## 2015-06-11 DIAGNOSIS — K449 Diaphragmatic hernia without obstruction or gangrene: Secondary | ICD-10-CM | POA: Diagnosis not present

## 2015-06-11 DIAGNOSIS — D649 Anemia, unspecified: Secondary | ICD-10-CM | POA: Diagnosis not present

## 2015-06-11 DIAGNOSIS — K2270 Barrett's esophagus without dysplasia: Secondary | ICD-10-CM | POA: Diagnosis not present

## 2015-06-11 DIAGNOSIS — Z9049 Acquired absence of other specified parts of digestive tract: Secondary | ICD-10-CM | POA: Insufficient documentation

## 2015-06-11 DIAGNOSIS — E7800 Pure hypercholesterolemia, unspecified: Secondary | ICD-10-CM | POA: Diagnosis not present

## 2015-06-11 DIAGNOSIS — K3184 Gastroparesis: Secondary | ICD-10-CM | POA: Diagnosis not present

## 2015-06-11 DIAGNOSIS — R262 Difficulty in walking, not elsewhere classified: Secondary | ICD-10-CM | POA: Diagnosis not present

## 2015-06-11 DIAGNOSIS — K529 Noninfective gastroenteritis and colitis, unspecified: Secondary | ICD-10-CM | POA: Diagnosis not present

## 2015-06-11 DIAGNOSIS — K219 Gastro-esophageal reflux disease without esophagitis: Secondary | ICD-10-CM | POA: Insufficient documentation

## 2015-06-11 DIAGNOSIS — R1084 Generalized abdominal pain: Secondary | ICD-10-CM | POA: Insufficient documentation

## 2015-06-11 DIAGNOSIS — Z8701 Personal history of pneumonia (recurrent): Secondary | ICD-10-CM | POA: Insufficient documentation

## 2015-06-11 DIAGNOSIS — Z836 Family history of other diseases of the respiratory system: Secondary | ICD-10-CM | POA: Insufficient documentation

## 2015-06-11 DIAGNOSIS — M1990 Unspecified osteoarthritis, unspecified site: Secondary | ICD-10-CM | POA: Diagnosis not present

## 2015-06-11 DIAGNOSIS — R100 Acute abdomen: Secondary | ICD-10-CM | POA: Diagnosis not present

## 2015-06-11 DIAGNOSIS — Z8041 Family history of malignant neoplasm of ovary: Secondary | ICD-10-CM | POA: Diagnosis not present

## 2015-06-11 DIAGNOSIS — Z66 Do not resuscitate: Secondary | ICD-10-CM | POA: Diagnosis not present

## 2015-06-11 DIAGNOSIS — M5416 Radiculopathy, lumbar region: Secondary | ICD-10-CM | POA: Diagnosis not present

## 2015-06-11 DIAGNOSIS — R531 Weakness: Secondary | ICD-10-CM | POA: Diagnosis not present

## 2015-06-11 DIAGNOSIS — S81801A Unspecified open wound, right lower leg, initial encounter: Secondary | ICD-10-CM | POA: Diagnosis not present

## 2015-06-11 HISTORY — DX: Cyst of kidney, acquired: N28.1

## 2015-06-11 HISTORY — DX: Scoliosis, unspecified: M41.9

## 2015-06-11 HISTORY — DX: Personal history of other (healed) physical injury and trauma: Z87.828

## 2015-06-11 HISTORY — DX: Diverticulitis of intestine, part unspecified, without perforation or abscess without bleeding: K57.92

## 2015-06-11 LAB — PROTIME-INR
INR: 0.91
Prothrombin Time: 12.5 seconds (ref 11.4–15.0)

## 2015-06-11 LAB — COMPREHENSIVE METABOLIC PANEL
ALK PHOS: 50 U/L (ref 38–126)
ALT: 17 U/L (ref 14–54)
ANION GAP: 10 (ref 5–15)
AST: 23 U/L (ref 15–41)
Albumin: 4 g/dL (ref 3.5–5.0)
BILIRUBIN TOTAL: 1.3 mg/dL — AB (ref 0.3–1.2)
BUN: 29 mg/dL — ABNORMAL HIGH (ref 6–20)
CALCIUM: 10.2 mg/dL (ref 8.9–10.3)
CO2: 29 mmol/L (ref 22–32)
Chloride: 99 mmol/L — ABNORMAL LOW (ref 101–111)
Creatinine, Ser: 1.2 mg/dL — ABNORMAL HIGH (ref 0.44–1.00)
GFR calc non Af Amer: 40 mL/min — ABNORMAL LOW (ref >60)
GFR, EST AFRICAN AMERICAN: 47 mL/min — AB (ref >60)
GLUCOSE: 112 mg/dL — AB (ref 65–99)
Potassium: 3.4 mmol/L — ABNORMAL LOW (ref 3.5–5.1)
Sodium: 138 mmol/L (ref 135–145)
TOTAL PROTEIN: 7.4 g/dL (ref 6.5–8.1)

## 2015-06-11 LAB — TYPE AND SCREEN
ABO/RH(D): B POS
Antibody Screen: NEGATIVE

## 2015-06-11 LAB — APTT: aPTT: 28 seconds (ref 24–36)

## 2015-06-11 LAB — CBC
HCT: 41.2 % (ref 35.0–47.0)
Hemoglobin: 14.1 g/dL (ref 12.0–16.0)
MCH: 32.6 pg (ref 26.0–34.0)
MCHC: 34.1 g/dL (ref 32.0–36.0)
MCV: 95.3 fL (ref 80.0–100.0)
Platelets: 305 10*3/uL (ref 150–440)
RBC: 4.32 MIL/uL (ref 3.80–5.20)
RDW: 13.6 % (ref 11.5–14.5)
WBC: 11.1 10*3/uL — ABNORMAL HIGH (ref 3.6–11.0)

## 2015-06-11 LAB — LIPASE, BLOOD: Lipase: 20 U/L (ref 11–51)

## 2015-06-11 LAB — LACTIC ACID, PLASMA: LACTIC ACID, VENOUS: 1.3 mmol/L (ref 0.5–2.0)

## 2015-06-11 LAB — TROPONIN I

## 2015-06-11 MED ORDER — ONDANSETRON HCL 4 MG/2ML IJ SOLN
4.0000 mg | Freq: Once | INTRAMUSCULAR | Status: AC
  Administered 2015-06-11: 4 mg via INTRAVENOUS

## 2015-06-11 MED ORDER — SODIUM CHLORIDE 0.9 % IV BOLUS (SEPSIS): 500.0000 mL | Freq: Once | INTRAVENOUS | Status: DC

## 2015-06-11 MED ORDER — DIATRIZOATE MEGLUMINE & SODIUM 66-10 % PO SOLN
15.0000 mL | Freq: Once | ORAL | Status: AC
  Administered 2015-06-11: 15 mL via ORAL

## 2015-06-11 MED ORDER — FENTANYL CITRATE (PF) 100 MCG/2ML IJ SOLN
INTRAMUSCULAR | Status: AC
  Administered 2015-06-11: 50 ug via INTRAVENOUS
  Filled 2015-06-11: qty 2

## 2015-06-11 MED ORDER — FENTANYL CITRATE (PF) 100 MCG/2ML IJ SOLN
50.0000 ug | Freq: Once | INTRAMUSCULAR | Status: AC
  Administered 2015-06-11: 50 ug via INTRAVENOUS
  Filled 2015-06-11: qty 2

## 2015-06-11 MED ORDER — ONDANSETRON HCL 4 MG/2ML IJ SOLN
INTRAMUSCULAR | Status: AC
  Administered 2015-06-11: 4 mg via INTRAVENOUS
  Filled 2015-06-11: qty 2

## 2015-06-11 MED ORDER — MORPHINE SULFATE (PF) 4 MG/ML IV SOLN
4.0000 mg | Freq: Once | INTRAVENOUS | Status: AC
  Administered 2015-06-11: 4 mg via INTRAVENOUS
  Filled 2015-06-11: qty 1

## 2015-06-11 MED ORDER — SODIUM CHLORIDE 0.9 % IV BOLUS (SEPSIS)
500.0000 mL | Freq: Once | INTRAVENOUS | Status: AC
  Administered 2015-06-11: 500 mL via INTRAVENOUS

## 2015-06-11 MED ORDER — ONDANSETRON HCL 4 MG/2ML IJ SOLN
4.0000 mg | Freq: Once | INTRAMUSCULAR | Status: AC
  Administered 2015-06-11: 4 mg via INTRAVENOUS
  Filled 2015-06-11: qty 2

## 2015-06-11 MED ORDER — FENTANYL CITRATE (PF) 100 MCG/2ML IJ SOLN
50.0000 ug | Freq: Once | INTRAMUSCULAR | Status: AC
  Administered 2015-06-11: 50 ug via INTRAVENOUS

## 2015-06-11 MED ORDER — IOPAMIDOL (ISOVUE-300) INJECTION 61%
75.0000 mL | Freq: Once | INTRAVENOUS | Status: AC | PRN
  Administered 2015-06-11: 75 mL via INTRAVENOUS

## 2015-06-11 NOTE — ED Notes (Signed)
Protocol labs entered. MD to bedside and asking that the following be added to the previously entered protocol orders: 1. Troponin 2. Lactic acid 3. 500cc NS bolus 4. Zofran 4mg  IVP 5. Fentanyl 48mcg IVP 6. Type and screen 7. Contrast CT of the abd and pelvis Orders to be entered and carried by this RN.

## 2015-06-11 NOTE — ED Notes (Signed)
Patient transported to X-ray 

## 2015-06-11 NOTE — ED Provider Notes (Addendum)
[facility] Emergency Department Provider Note  ____________________________________________   I have reviewed the triage vital signs and the nursing notes.   HISTORY  Chief Complaint Abdominal Pain; Nausea; and Emesis    HPI [name] is a 82 y.o. female with a history of SBO status post cancer of the colon in the late 1980s presents today complaining of nausea vomiting abdominal tenderness. Patient started vomiting last night. Had a small bowel movement this morning and nothing since. She has significant abdominal pain. She denies any hematemesis or bright red blood per rectum she denies any melena or fever she denies chest pain or shortness of breath. The pain is diffuse in the abdomen and sharp.  Past Medical History  Diagnosis Date  . Bowel obstruction (HCC)     s/p adhesion resection  . Recurrent sinus infections   . Vertigo   . Barrett's esophagus   . Hiatal hernia   . Hypercholesteremia   . Hypertension   . Chronic back pain   . Peripheral neuropathy ([place])   . Colon cancer ([place]) 1988 and 1989    adenocarcinoma, s/p resection x  2 and chemo  . S/P chemotherapy, time since greater than 12 weeks     colon cancer    Patient Active Problem List   Diagnosis Date Noted  . Hemochromatosis 05/25/2015  . Leg wound, right 05/19/2015  . Bilateral lower extremity edema 05/19/2015  . Iron excess 04/16/2015  . URI (upper respiratory infection) 01/18/2015  . Dizziness 10/26/2014  . Hyperbilirubinemia 10/26/2014  . Health care maintenance 05/18/2014  . History of pneumonia 05/18/2014  . Anemia, iron deficiency 04/21/2014  . Hospital discharge follow-up 04/14/2014  . Sweats, menopausal 03/07/2014  . Face lesion 11/03/2013  . Numbness in feet 10/08/2012  . Nocturia 10/08/2012  . Hypertension 12/18/2011  . Hypercholesteremia 12/18/2011  . History of colon cancer 12/18/2011  . Barrett's esophagus 12/18/2011  . Chronic back pain 12/18/2011  .  Osteopenia 12/18/2011    Past Surgical History  Procedure Laterality Date  . Colon resection      x2.  s/p colon cancer  . Adhesions resected      bowel obstruction  . Cholecystectomy    . Abdominal hysterectomy  1982  . Appendectomy    . Excisional hemorrhoidectomy      with tubal ligation  . Breast biopsy Left     negative 06/14/1985    Current Outpatient Rx  Name  Route  Sig  Dispense  Refill  . Calcium Carbonate-Vitamin D (CALCIUM 600 + D PO)   Oral   Take 600 Units by mouth daily.          . Cholecalciferol (VITAMIN D3) 2000 UNITS TABS   Oral   Take 2,000 Units by mouth daily.         Marland Kitchen COLACE 100 MG capsule   Oral   Take 1 capsule (100 mg total) by mouth 2 (two) times daily.   60 capsule   0     Dispense as written.   Marland Kitchen estradiol (ESTRACE) 2 MG tablet      TAKE 1 TABLET EVERY DAY   90 tablet   1   . fexofenadine (ALLEGRA) 60 MG tablet      TAKE 1 TABLET EVERY DAY   90 tablet   3   . fish oil-omega-3 fatty acids 1000 MG capsule   Oral   Take 2 g by mouth daily.         Marland Kitchen  fluticasone (FLOVENT HFA) 110 MCG/ACT inhaler   Inhalation   Inhale 2 puffs into the lungs 2 (two) times daily.   1 Inhaler   1   . lactase (LACTAID) 3000 UNITS tablet   Oral   Take 1 tablet by mouth as needed.         . lidocaine (LIDODERM) 5 %   Transdermal   Place 1 patch onto the skin as needed. Remove & Discard patch within 12 hours or as directed by MD         . lidocaine (XYLOCAINE) 4 % external solution   Topical   Apply topically 2 (two) times daily as needed.   50 mL   1   . losartan-hydrochlorothiazide (HYZAAR) 50-12.5 MG tablet      TAKE 1 TABLET BY MOUTH DAILY.   30 tablet   11   . meclizine (ANTIVERT) 25 MG tablet   Oral   Take 25 mg by mouth 3 (three) times daily as needed.         . meloxicam (MOBIC) 7.5 MG tablet   Oral   Take 7.5 mg by mouth daily.         . metoCLOPramide (REGLAN) 5 MG tablet   Oral   Take 5 mg by mouth. Take  one tablet three times a day         . metoprolol succinate (TOPROL-XL) 25 MG 24 hr tablet   Oral   Take 1 tablet (25 mg total) by mouth daily.   90 tablet   3   . oxyCODONE (OXY IR/ROXICODONE) 5 MG immediate release tablet   Oral   Take 5 mg by mouth 2 (two) times daily as needed.          . pantoprazole (PROTONIX) 40 MG tablet   Oral   Take 40 mg by mouth 2 (two) times daily.         . simvastatin (ZOCOR) 10 MG tablet      TAKE 1 TABLET AT BEDTIME   90 tablet   3   . solifenacin (VESICARE) 5 MG tablet   Oral   Take 1 tablet (5 mg total) by mouth daily.   30 tablet   1   . traMADol-acetaminophen (ULTRACET) 37.5-325 MG per tablet   Oral   Take 1 tablet by mouth every 8 (eight) hours as needed.         . VENTOLIN HFA 108 (90 BASE) MCG/ACT inhaler      1-2 INHALATIONS EVERY 4-6 HOURS AS NEEDED FOR WHEEZING. DISPENSE SPACER AS NEEDED.      0     Dispense as written.     Allergies Review of patient's allergies indicates no known allergies.  Family History  Problem Relation Age of Onset  . Breast cancer Mother   . Stroke Father   . Hypertension Father   . Diabetes Father   . Asthma Mother   . Ovarian cancer Sister     x2  . Prostate cancer Brother   . Hypertension Brother     x3  . Hypercholesterolemia Brother     x3  . Diabetes Brother     Social History Social History  Substance Use Topics  . Smoking status: Never Smoker   . Smokeless tobacco: Never Used  . Alcohol Use: No    Review of Systems }Constitutional: No fever/chills Eyes: No visual changes. ENT: No sore throat. No stiff neck no neck pain Cardiovascular: Denies chest pain. Respiratory: Denies shortness  of breath. Gastrointestinal:   Positive vomiting.  No diarrhea.  No constipation. Genitourinary: Negative for dysuria. Musculoskeletal: Negative lower extremity swelling Skin: Negative for rash. Neurological: Negative for headaches, focal weakness or numbness. 10-point ROS  otherwise negative.  ____________________________________________   PHYSICAL EXAM:  VITAL SIGNS: ED Triage Vitals  Enc Vitals Group     BP 06/11/15 2015 182/87 mmHg     Pulse Rate 06/11/15 2015 69     Resp 06/11/15 2015 16     Temp 06/11/15 2015 98.9 F (37.2 C)     Temp Source 06/11/15 2015 Oral     SpO2 06/11/15 2015 97 %     Weight 06/11/15 2015 148 lb (67.132 kg)     Height 06/11/15 2015 5\' 1"  (1.549 m)     Head Cir --      Peak Flow --      Pain Score 06/11/15 2017 10     Pain Loc --      Pain Edu? --      Excl. in [place]? --     Constitutional: Alert and oriented. Patient appears uncomfortable but nontoxic Eyes: Conjunctivae are normal. PERRL. EOMI. Head: Atraumatic. Nose: No congestion/rhinnorhea. Mouth/Throat: Mucous membranes are moist.  Oropharynx non-erythematous. Neck: No stridor.   Nontender with no meningismus Cardiovascular: Normal rate, regular rhythm. Grossly normal heart sounds.  Good peripheral circulation. Respiratory: Normal respiratory effort.  No retractions. Lungs CTAB. Abdominal: Patient with voluntary and involuntary guarding diffusely, her abdomen is distended and has likely early peritoneal signs with mild early rebound Back:  There is no focal tenderness or step off there is no midline tenderness there are no lesions noted. there is no CVA tenderness Musculoskeletal: No lower extremity tenderness. No joint effusions, no DVT signs strong distal pulses no edema Neurologic:  Normal speech and language. No gross focal neurologic deficits are appreciated.  Skin:  Skin is warm, dry and intact. No rash noted. Psychiatric: Mood and affect are normal. Speech and behavior are normal.  ____________________________________________   LABS (all labs ordered are listed, but only abnormal results are displayed)  Labs Reviewed  LIPASE, BLOOD  COMPREHENSIVE METABOLIC PANEL  CBC  URINALYSIS COMPLETEWITH MICROSCOPIC ([facility] ONLY)  LACTIC ACID, PLASMA  LACTIC  ACID, PLASMA  TROPONIN I  PROTIME-INR  APTT  TYPE AND SCREEN   ____________________________________________  EKG  I personally interpreted any EKGs ordered by me or triage Sinus rhythm rate 70 bpm no acute ST elevation or depression LAD noted, no acute ischemic changes. Possible old inferior infarct ____________________________________________  RADIOLOGY  I reviewed any imaging ordered by me or triage that were performed during my shift and, if possible, patient and/or family made aware of any abnormal findings. ____________________________________________   PROCEDURES  Procedure(s) performed: None  Critical Care performed: None  ____________________________________________   INITIAL IMPRESSION / ASSESSMENT AND PLAN / ED COURSE  Pertinent labs & imaging results that were available during my care of the patient were reviewed by me and considered in my medical decision making (see chart for details). Patient with a very concerning abdominal exam and story vital signs are reassuring with her giving her pain medications anti-emetics she will require CT scan. We will, however, start with an acute abdomen series is a strong suspicion the patient may have an obstruction. We will reassess after pain medication, blood work is been ordered.    ----------------------------------------- 9:41 PM on 06/11/2015 -----------------------------------------  Vital signs remain reassuring, we're giving the patient more pain medication, awaiting creatinine for  CT, I have called the lab to ensure that it is promptly done.  ----------------------------------------- 10:58 PM on 06/11/2015 -----------------------------------------  She still has abdominal pain however CT is reassuring blood work is reassuring I did talk to Dr. Heath Lark the surgeon who reviewed the CT with me and she agrees with radiology there is no evidence of acute surgical process nonetheless patient has ongoing abdominal pain  adequate benefit from observational admission  ____________________________________________   FINAL CLINICAL IMPRESSION(S) / ED DIAGNOSES  Final diagnoses:  None      This chart was dictated using voice recognition software.  Despite best efforts to proofread,  errors can occur which can change meaning.     Schuyler Amor, MD 06/11/15 2050  Schuyler Amor, MD 06/11/15 2141  Schuyler Amor, MD 06/11/15 2259

## 2015-06-11 NOTE — ED Notes (Signed)
Patient presents to ED via EMS from home. Patient with acute onset "severe" abdominal pain that began at 1700. (+) N/V reported. Patient's abd tender, firm, and swollen per her report. Of note, patient with PMH significant for colon cancer; treated in 1988 or 1989. Patient reports that she felt "a little better" after vomiting.

## 2015-06-12 ENCOUNTER — Encounter: Payer: Self-pay | Admitting: Internal Medicine

## 2015-06-12 DIAGNOSIS — K5289 Other specified noninfective gastroenteritis and colitis: Secondary | ICD-10-CM | POA: Diagnosis not present

## 2015-06-12 DIAGNOSIS — K529 Noninfective gastroenteritis and colitis, unspecified: Secondary | ICD-10-CM | POA: Diagnosis present

## 2015-06-12 DIAGNOSIS — I10 Essential (primary) hypertension: Secondary | ICD-10-CM | POA: Diagnosis not present

## 2015-06-12 DIAGNOSIS — E876 Hypokalemia: Secondary | ICD-10-CM | POA: Diagnosis present

## 2015-06-12 DIAGNOSIS — E785 Hyperlipidemia, unspecified: Secondary | ICD-10-CM | POA: Diagnosis not present

## 2015-06-12 DIAGNOSIS — R10817 Generalized abdominal tenderness: Secondary | ICD-10-CM | POA: Diagnosis not present

## 2015-06-12 LAB — CBC
HCT: 39.4 % (ref 35.0–47.0)
Hemoglobin: 13.8 g/dL (ref 12.0–16.0)
MCH: 33.3 pg (ref 26.0–34.0)
MCHC: 35.1 g/dL (ref 32.0–36.0)
MCV: 95 fL (ref 80.0–100.0)
PLATELETS: 294 10*3/uL (ref 150–440)
RBC: 4.15 MIL/uL (ref 3.80–5.20)
RDW: 13.6 % (ref 11.5–14.5)
WBC: 8.7 10*3/uL (ref 3.6–11.0)

## 2015-06-12 LAB — URINALYSIS COMPLETE WITH MICROSCOPIC (ARMC ONLY)
Bacteria, UA: NONE SEEN
Bilirubin Urine: NEGATIVE
Glucose, UA: NEGATIVE mg/dL
Hgb urine dipstick: NEGATIVE
KETONES UR: NEGATIVE mg/dL
Leukocytes, UA: NEGATIVE
NITRITE: NEGATIVE
PH: 6 (ref 5.0–8.0)
PROTEIN: NEGATIVE mg/dL
Specific Gravity, Urine: 1.034 — ABNORMAL HIGH (ref 1.005–1.030)

## 2015-06-12 LAB — BASIC METABOLIC PANEL
Anion gap: 11 (ref 5–15)
BUN: 23 mg/dL — ABNORMAL HIGH (ref 6–20)
CHLORIDE: 101 mmol/L (ref 101–111)
CO2: 26 mmol/L (ref 22–32)
CREATININE: 1 mg/dL (ref 0.44–1.00)
Calcium: 9.2 mg/dL (ref 8.9–10.3)
GFR, EST AFRICAN AMERICAN: 58 mL/min — AB (ref >60)
GFR, EST NON AFRICAN AMERICAN: 50 mL/min — AB (ref >60)
Glucose, Bld: 120 mg/dL — ABNORMAL HIGH (ref 65–99)
POTASSIUM: 3 mmol/L — AB (ref 3.5–5.1)
SODIUM: 138 mmol/L (ref 135–145)

## 2015-06-12 LAB — LACTIC ACID, PLASMA: LACTIC ACID, VENOUS: 1.1 mmol/L (ref 0.5–2.0)

## 2015-06-12 LAB — MAGNESIUM: Magnesium: 2.1 mg/dL (ref 1.7–2.4)

## 2015-06-12 LAB — ABO/RH: ABO/RH(D): B POS

## 2015-06-12 MED ORDER — METOCLOPRAMIDE HCL 5 MG PO TABS
5.0000 mg | ORAL_TABLET | Freq: Three times a day (TID) | ORAL | Status: DC
  Administered 2015-06-12 – 2015-06-14 (×7): 5 mg via ORAL
  Filled 2015-06-12 (×7): qty 1

## 2015-06-12 MED ORDER — MORPHINE SULFATE (PF) 2 MG/ML IV SOLN: 2.0000 mg | INTRAVENOUS | Status: DC | PRN

## 2015-06-12 MED ORDER — POTASSIUM CHLORIDE CRYS ER 20 MEQ PO TBCR
40.0000 meq | EXTENDED_RELEASE_TABLET | Freq: Once | ORAL | Status: AC
  Administered 2015-06-12: 40 meq via ORAL
  Filled 2015-06-12: qty 2

## 2015-06-12 MED ORDER — LORATADINE 10 MG PO TABS
10.0000 mg | ORAL_TABLET | Freq: Every day | ORAL | Status: DC
  Administered 2015-06-12 – 2015-06-14 (×3): 10 mg via ORAL
  Filled 2015-06-12 (×3): qty 1

## 2015-06-12 MED ORDER — OXYCODONE HCL 5 MG PO TABS: 5.0000 mg | ORAL_TABLET | Freq: Four times a day (QID) | ORAL | Status: DC | PRN

## 2015-06-12 MED ORDER — COLLAGENASE 250 UNIT/GM EX OINT
TOPICAL_OINTMENT | Freq: Every day | CUTANEOUS | Status: DC
  Administered 2015-06-12 – 2015-06-13 (×2): via TOPICAL
  Filled 2015-06-12: qty 30

## 2015-06-12 MED ORDER — HYDRALAZINE HCL 20 MG/ML IJ SOLN: 10.0000 mg | Freq: Four times a day (QID) | INTRAMUSCULAR | Status: DC | PRN

## 2015-06-12 MED ORDER — LOSARTAN POTASSIUM-HCTZ 50-12.5 MG PO TABS: 1.0000 | ORAL_TABLET | Freq: Every day | ORAL | Status: DC

## 2015-06-12 MED ORDER — HEPARIN SODIUM (PORCINE) 5000 UNIT/ML IJ SOLN
5000.0000 [IU] | Freq: Three times a day (TID) | INTRAMUSCULAR | Status: DC
  Administered 2015-06-12 – 2015-06-14 (×6): 5000 [IU] via SUBCUTANEOUS
  Filled 2015-06-12 (×6): qty 1

## 2015-06-12 MED ORDER — MECLIZINE HCL 25 MG PO TABS: 25.0000 mg | ORAL_TABLET | Freq: Three times a day (TID) | ORAL | Status: DC | PRN

## 2015-06-12 MED ORDER — HYDROCHLOROTHIAZIDE 12.5 MG PO CAPS
12.5000 mg | ORAL_CAPSULE | Freq: Every day | ORAL | Status: DC
  Administered 2015-06-12 – 2015-06-13 (×2): 12.5 mg via ORAL
  Filled 2015-06-12 (×2): qty 1

## 2015-06-12 MED ORDER — LISINOPRIL 10 MG PO TABS
10.0000 mg | ORAL_TABLET | Freq: Every day | ORAL | Status: DC
  Administered 2015-06-12 – 2015-06-14 (×3): 10 mg via ORAL
  Filled 2015-06-12 (×3): qty 1

## 2015-06-12 MED ORDER — OXYCODONE HCL 5 MG PO TABS
5.0000 mg | ORAL_TABLET | Freq: Four times a day (QID) | ORAL | Status: DC | PRN
  Administered 2015-06-12: 5 mg via ORAL
  Filled 2015-06-12: qty 1

## 2015-06-12 MED ORDER — SIMVASTATIN 20 MG PO TABS
10.0000 mg | ORAL_TABLET | Freq: Every day | ORAL | Status: DC
  Administered 2015-06-12 – 2015-06-13 (×2): 10 mg via ORAL
  Filled 2015-06-12 (×2): qty 1

## 2015-06-12 MED ORDER — TRAMADOL-ACETAMINOPHEN 37.5-325 MG PO TABS: 1.0000 | ORAL_TABLET | Freq: Two times a day (BID) | ORAL | Status: DC | PRN

## 2015-06-12 MED ORDER — MORPHINE SULFATE (PF) 2 MG/ML IV SOLN
2.0000 mg | INTRAVENOUS | Status: DC | PRN
  Administered 2015-06-12: 2 mg via INTRAVENOUS
  Filled 2015-06-12: qty 1

## 2015-06-12 MED ORDER — POTASSIUM CHLORIDE 20 MEQ/15ML (10%) PO SOLN
40.0000 meq | Freq: Once | ORAL | Status: DC
  Filled 2015-06-12: qty 30

## 2015-06-12 MED ORDER — CIPROFLOXACIN IN D5W 400 MG/200ML IV SOLN
400.0000 mg | Freq: Two times a day (BID) | INTRAVENOUS | Status: DC
  Administered 2015-06-12 – 2015-06-14 (×5): 400 mg via INTRAVENOUS
  Filled 2015-06-12 (×6): qty 200

## 2015-06-12 MED ORDER — ONDANSETRON HCL 4 MG/2ML IJ SOLN
4.0000 mg | INTRAMUSCULAR | Status: DC | PRN
  Administered 2015-06-12: 4 mg via INTRAVENOUS
  Filled 2015-06-12: qty 2

## 2015-06-12 MED ORDER — LISINOPRIL-HYDROCHLOROTHIAZIDE 10-12.5 MG PO TABS: 1.0000 | ORAL_TABLET | Freq: Every day | ORAL | Status: DC

## 2015-06-12 MED ORDER — PANTOPRAZOLE SODIUM 40 MG IV SOLR
40.0000 mg | Freq: Two times a day (BID) | INTRAVENOUS | Status: DC
  Administered 2015-06-12 – 2015-06-14 (×6): 40 mg via INTRAVENOUS
  Filled 2015-06-12 (×6): qty 40

## 2015-06-12 MED ORDER — LIDOCAINE 5 % EX PTCH
3.0000 | MEDICATED_PATCH | CUTANEOUS | Status: DC
  Administered 2015-06-12: 1 via TRANSDERMAL
  Filled 2015-06-12 (×4): qty 3

## 2015-06-12 MED ORDER — METOPROLOL SUCCINATE ER 25 MG PO TB24
25.0000 mg | ORAL_TABLET | Freq: Every day | ORAL | Status: DC
Start: 2015-06-12 — End: 2015-06-14
  Administered 2015-06-12 – 2015-06-14 (×3): 25 mg via ORAL
  Filled 2015-06-12 (×3): qty 1

## 2015-06-12 MED ORDER — METRONIDAZOLE IN NACL 5-0.79 MG/ML-% IV SOLN
500.0000 mg | Freq: Three times a day (TID) | INTRAVENOUS | Status: DC
  Administered 2015-06-12 – 2015-06-14 (×6): 500 mg via INTRAVENOUS
  Filled 2015-06-12 (×10): qty 100

## 2015-06-12 NOTE — Consult Note (Signed)
WOC wound consult note Reason for Consult: Chronic nonhealing wound to right lower anterior leg.  Distal lesion has healed with scarring noted.  2 cm x 0.6 cm x 0.1 cm lesion present.  Using collagen dressing changed every other day, prescribed by wound care center Wound type:Chronic nonhealing ulcer Pressure Ulcer POA: Yes Measurement: 2 cm x 0.6 cm x 0.1 cm ruddy red wound bed.  Wound bed: ruddy red Drainage (amount, consistency, odor) Minimal serosanguinous  No odor.  Periwound:Scarring from previous healed ulcer noted.   Dressing procedure/placement/frequency:Cleanse wound to right anterior leg with NS and pat gently dry.  Apply santyl to wound bed.  Cover with NS moist gauze.  Cover with 4x4 gauze, kerlix and tape.  Will not follow at this time.  Please re-consult if needed.  Domenic Moras RN BSN [place] Pager [phone]

## 2015-06-12 NOTE — Progress Notes (Signed)
Subjective - pt. Admitted w/ abdominal pain, N/V.  Still having some abdominal pain, but no N/V.  Family at bedside.   Assessment & Plan:  1). Acute ileitis - Patient has a complex GI history including colon cancer status post resection and small bowel obstruction status post adhesion lysis. - She presents with acute onset of abdominal pain, nausea and vomiting and CT scan showed evidence of acute ileitis. - cont. Cipro, Flagyl.  Place on Clear liquid diet.  GI consult.  Cont. IV fluids, pain-control, anti-emetics.   2). Accelerated hypertension - BP improved.  - cont. Lisinopril/HCTZ, Toprol, PRN hydralazine  3). Hypokalemia - cont. To supplement.  Repeat level in a.m.   4. GERD - cont. Protonix.   5. Hyperlipidemia - cont. Simvastatin.

## 2015-06-12 NOTE — Consult Note (Signed)
GI Inpatient Consult Note  Reason for Consult: Abdominal pain, ileitis   Attending Requesting Consult: Dr. Verdell Carmine  History of Present Illness: [name] is a 82 y.o. female With a significant past medical history of colon cancer status post colectomy, small bowel obstructions, Barrett's esophagus reports that approximately 2 weeks ago she experienced some abdominal pain and vomited which resolved the abdominal pain.  She reports that yesterday at 5 PM the pain returned, unfortunately it was much worse than the previous event.  She described it as across her entire abdomen.  She reports she did have some vomiting undigested food and liquids at approximately 5 PM, last time she ate was at lunch at 1 PM.  She reported to the emergency department last evening, has not had any further vomiting and reports her abdominal pain is improving.  She reports her last bowel movement was yesterday, describes it as soft normal formed, but small, denies any blood.  She is on Protonix 40 mg twice a day and takes Reglan at night for gastroparesis.  She denies any heartburn or acid reflux symptoms.  She denies any chest pain or shortness of breath.  She is well-known in our office, is a patient of Dr. Vira Agar and maintain regular follow-up with him.  Her last colonoscopy was in June 2012 with the impression of internal hemorrhoids, exam was otherwise normal.  Her last upper endoscopy was in 2012 as well, for the indication of follow-up for Barrett's esophagus, impression esophageal mucosal changes suspicious for sort segment Barrett's esophagus.  Hiatus hernia.  Normal examined duodenum.  She had a few bites of toast at breakfast and then was switched to clear liquids at lunch she ate a small amount of the clear liquids and unfortunately did experiencing nausea and abdominal pain after.  Past Medical History:  Past Medical History  Diagnosis Date  . Bowel obstruction (HCC)     s/p adhesion resection  .  Recurrent sinus infections   . Vertigo   . Barrett's esophagus   . Hiatal hernia   . Hypercholesteremia   . Hypertension   . Chronic back pain   . Peripheral neuropathy ([place])   . Colon cancer ([place]) 1988 and 1989    adenocarcinoma, s/p resection x  2 and chemo  . S/P chemotherapy, time since greater than 12 weeks     colon cancer  . Diverticulitis   . Lumbar scoliosis   . Renal cyst   . H/O open leg wound     Problem List: Patient Active Problem List   Diagnosis Date Noted  . Acute Ileitis 06/12/2015  . Hypokalemia 06/12/2015  . Accelerated hypertension 06/12/2015  . Hemochromatosis 05/25/2015  . Leg wound, right 05/19/2015  . Bilateral lower extremity edema 05/19/2015  . Iron excess 04/16/2015  . URI (upper respiratory infection) 01/18/2015  . Dizziness 10/26/2014  . Hyperbilirubinemia 10/26/2014  . Health care maintenance 05/18/2014  . History of pneumonia 05/18/2014  . Anemia, iron deficiency 04/21/2014  . Hospital discharge follow-up 04/14/2014  . Sweats, menopausal 03/07/2014  . Face lesion 11/03/2013  . Numbness in feet 10/08/2012  . Nocturia 10/08/2012  . Hypertension 12/18/2011  . Hypercholesteremia 12/18/2011  . History of colon cancer 12/18/2011  . Barrett's esophagus 12/18/2011  . Chronic back pain 12/18/2011  . Osteopenia 12/18/2011    Past Surgical History: Past Surgical History  Procedure Laterality Date  . Colon resection      x2.  s/p colon cancer  . Adhesions resected  bowel obstruction  . Cholecystectomy    . Abdominal hysterectomy  1982  . Appendectomy    . Excisional hemorrhoidectomy      with tubal ligation  . Breast biopsy Left     negative 06/14/1985    Allergies: No Known Allergies  Home Medications: Prescriptions prior to admission  Medication Sig Dispense Refill Last Dose  . Calcium Carbonate-Vitamin D (CALCIUM 600 + D PO) Take 600 Units by mouth 2 (two) times daily with a meal.    unknown at unknonw  . estradiol (ESTRACE) 2  MG tablet TAKE 1 TABLET EVERY DAY 90 tablet 1 unknown at unknown  . fexofenadine (ALLEGRA) 60 MG tablet TAKE 1 TABLET EVERY DAY 90 tablet 3 unknown at unknown  . fish oil-omega-3 fatty acids 1000 MG capsule Take 2 g by mouth daily.   unknown at unknown  . lactase (LACTAID) 3000 UNITS tablet Take 1 tablet by mouth as needed.   unknown at unknown  . lidocaine (LIDODERM) 5 % Place 3 patches onto the skin daily. Remove & Discard patch within 12 hours or as directed by MD   unknwon at unknown  . lisinopril-hydrochlorothiazide (PRINZIDE,ZESTORETIC) 10-12.5 MG tablet Take 1 tablet by mouth daily.   unknown at unknown  . meclizine (ANTIVERT) 25 MG tablet Take 25 mg by mouth 3 (three) times daily as needed.   unknown at unknown  . meloxicam (MOBIC) 7.5 MG tablet Take 7.5 mg by mouth daily.   unknwon at unknown  . metoCLOPramide (REGLAN) 5 MG tablet Take 5 mg by mouth. Take one tablet three times a day   unknown at unknown  . metoprolol succinate (TOPROL-XL) 25 MG 24 hr tablet Take 1 tablet (25 mg total) by mouth daily. 90 tablet 3 unknown at unknown  . oxyCODONE (OXY IR/ROXICODONE) 5 MG immediate release tablet Take 5 mg by mouth 3 (three) times daily.    unknown at unknown  . pantoprazole (PROTONIX) 40 MG tablet Take 40 mg by mouth 2 (two) times daily.   unknown at unknown  . simvastatin (ZOCOR) 10 MG tablet TAKE 1 TABLET AT BEDTIME 90 tablet 3 unknown at unknown  . traMADol-acetaminophen (ULTRACET) 37.5-325 MG per tablet Take 1 tablet by mouth 2 (two) times daily as needed.    unknwon at [employer]  . losartan-hydrochlorothiazide (HYZAAR) 50-12.5 MG tablet TAKE 1 TABLET BY MOUTH DAILY. (Patient not taking: Reported on 06/11/2015) 30 tablet 11 Not Taking at Unknown time   Home medication reconciliation was completed with the patient.   Scheduled Inpatient Medications:   . ciprofloxacin  400 mg Intravenous Q12H  . heparin  5,000 Units Subcutaneous Q8H  . lisinopril  10 mg Oral Daily   And  .  hydrochlorothiazide  12.5 mg Oral Daily  . lidocaine  3 patch Transdermal Q24H  . loratadine  10 mg Oral Daily  . metoCLOPramide  5 mg Oral TID AC  . metoprolol succinate  25 mg Oral Daily  . metronidazole  500 mg Intravenous Q8H  . pantoprazole (PROTONIX) IV  40 mg Intravenous Q12H  . simvastatin  10 mg Oral QHS    Continuous Inpatient Infusions:     PRN Inpatient Medications:  hydrALAZINE, meclizine, morphine injection, ondansetron (ZOFRAN) IV, oxyCODONE, traMADol-acetaminophen  Family History: family history includes Asthma in her mother; Breast cancer in her mother; Diabetes in her brother and father; Hypercholesterolemia in her brother; Hypertension in her brother and father; Ovarian cancer in her sister; Prostate cancer in her brother; Stroke in her father.  Social History:   reports that she has never smoked. She has never used smokeless tobacco. She reports that she does not drink alcohol or use illicit drugs.    Review of Systems: Constitutional: Weight is stable.  Eyes: No changes in vision. ENT: No oral lesions, sore throat.  GI: see HPI.  Heme/Lymph: No easy bruising.  CV: No chest pain.  GU: No hematuria.  Integumentary: No rashes.  Neuro: No headaches.  Psych: No depression/anxiety.  Endocrine: No heat/cold intolerance.  Allergic/Immunologic: No urticaria.  Resp: No cough, SOB.  Musculoskeletal: No joint swelling.    Physical Examination: BP 105/43 mmHg  Pulse 68  Temp(Src) 98.4 F (36.9 C) (Oral)  Resp 17  Ht 5\' 1"  (1.549 m)  Wt 65.635 kg (144 lb 11.2 oz)  BMI 27.35 kg/m2  SpO2 95% Gen: NAD, alert and oriented x 4, daughter Jenny Reichmann is present and helps with history HEENT: PEERLA, EOMI, Neck: supple, no JVD or thyromegaly Chest: CTA bilaterally, no wheezes, crackles, or other adventitious sounds CV: RRR, no m/g/c/r Abd: generalized abdominal tenderness,  ND, +BS in all four quadrants; no HSM, guarding, ridigity, or rebound tenderness Ext: no edema,  well perfused with 2+ pulses, Skin: no rash or lesions noted Lymph: no LAD  Data: Lab Results  Component Value Date   WBC 8.7 06/12/2015   HGB 13.8 06/12/2015   HCT 39.4 06/12/2015   MCV 95.0 06/12/2015   PLT 294 06/12/2015    Recent Labs Lab 06/11/15 2028 06/12/15 0358  HGB 14.1 13.8   Lab Results  Component Value Date   NA 138 06/12/2015   K 3.0* 06/12/2015   CL 101 06/12/2015   CO2 26 06/12/2015   BUN 23* 06/12/2015   CREATININE 1.00 06/12/2015   Lab Results  Component Value Date   ALT 17 06/11/2015   AST 23 06/11/2015   ALKPHOS 50 06/11/2015   BILITOT 1.3* 06/11/2015    Recent Labs Lab 06/11/15 2028  APTT 28  INR 0.91    Imaging: CLINICAL DATA: Acute onset of nausea, vomiting and abdominal tenderness. Initial encounter.  EXAM: CT ABDOMEN AND PELVIS WITH CONTRAST  TECHNIQUE: Multidetector CT imaging of the abdomen and pelvis was performed using the standard protocol following bolus administration of intravenous contrast.  CONTRAST: 70mL ISOVUE-300 IOPAMIDOL (ISOVUE-300) INJECTION 61%  COMPARISON: MRI of the lumbar spine performed 01/20/2015, and CT of the abdomen and pelvis from 02/06/2008  FINDINGS: The visualized lung bases are clear. A moderate hiatal hernia is noted.  The liver is unremarkable in appearance. Numerous calcified granulomata are noted within the spleen. The patient is status post cholecystectomy, with clips noted at the gallbladder fossa. The pancreas and adrenal glands are unremarkable.  A 4.9 cm cyst is noted at the upper pole of the left kidney, demonstrating minimal peripheral wall thickening. This has decreased in size from 2009. The kidneys are otherwise grossly unremarkable. There is no evidence of hydronephrosis. No renal or ureteral stones are identified. No perinephric stranding is seen.  There is distention of small-bowel loops to 3.0 cm in maximal diameter, aside from more prominent chronic  distention of a small bowel suture line at the right lower quadrant. There is gradual decompression, with mild wall thickening along the distal ileum, and surrounding trace fluid and soft tissue inflammation. There is mild fecalization at this region. Findings are concerning for acute infectious or inflammatory ileitis. No focal transition point is seen.  The stomach is within normal limits. No acute vascular abnormalities are seen.  Postoperative change is noted about the right lower quadrant, with an ileocolic anastomosis along the ascending colon. The colon is otherwise unremarkable.  The bladder is moderately distended and grossly unremarkable. The patient is status post hysterectomy. No suspicious adnexal masses are seen. No inguinal lymphadenopathy is seen.  No acute osseous abnormalities are identified. Left convex lumbar scoliosis is noted. Multilevel vacuum phenomenon is noted along the lower thoracic and lumbar spine.  IMPRESSION: 1. Gradual decompression of small bowel, with mild wall thickening along the distal ileum, and surrounding trace fluid and soft tissue inflammation. Mild fecalization noted at this region. Findings concerning for acute infectious or inflammatory ileitis. No focal transition point seen to suggest bowel obstruction. 2. Moderate hiatal hernia noted. 3. Large left renal cyst has decreased in size from 2009. Minimal peripheral wall thickening at the cyst is also stable. 4. Left convex lumbar scoliosis noted. Mild degenerative change along the lower thoracic and lumbar spine.   Electronically Signed  By: Garald Balding M.D.  On: 06/11/2015 22:24  Assessment/Plan: Ms. Graetz is a 82 y.o. female  With diffuse abdominal pain, ileitis on CT, is being treated with Cipro and Flagyl, Protonix 40 mg twice a day IV, Reglan, is on clear liquids.  GI panel is pending, patient has not had a bowel movement since admission.   Recommendations: We  recommend continuing Cipro/Flagyl, completing GI panel when able.  Continuing to monitor symptoms and advance diet as tolerated.  We will continue to follow with you.  Thank you for the consult. Please call with questions or concerns.  Salvadore Farber, PA-C  I personally performed these services.

## 2015-06-12 NOTE — Care Management Obs Status (Signed)
[place] NOTIFICATION   Patient Details  Name: [name] MRN: [id number] Date of Birth: 22-Jun-1928   Medicare Observation Status Notification Given:  Yes Signed and sent to HIM   Beverly Sessions, RN 06/12/2015, 11:11 AM

## 2015-06-12 NOTE — Care Management Note (Signed)
Case Management Note  Patient Details  Name: [name] MRN: [id number] Date of Birth: Feb 10, 1929  Subjective/Objective:      Patient admitted from home with acute ileitis. Patient lives at home with her husband.  Ambulates with a rollator.  Patient states that she is mobility at her baseline.  Patient obtains her medication through  [facility].               Action/Plan: No needs identified at this time.   Expected Discharge Date:                  Expected Discharge Plan:     In-House Referral:     Discharge planning Services     Post Acute Care Choice:    Choice offered to:     DME Arranged:    DME Agency:     HH Arranged:    [place] Agency:     Status of Service:     Medicare Important Message Given:    Date Medicare IM Given:    Medicare IM give by:    Date Additional Medicare IM Given:    Additional Medicare Important Message give by:     If discussed at [place] of Stay Meetings, dates discussed:    Additional Comments:  Beverly Sessions, RN 06/12/2015, 11:07 AM

## 2015-06-12 NOTE — Progress Notes (Signed)
ABEL, RA ([id number]) Visit Report for 06/11/2015 Arrival Information Details Patient Name: [name], [name] Date of Service: 06/11/2015 12:45 PM Medical Record Number: [id number] Patient Account Number: [id number] Date of Birth/Sex: 1928-05-27 (82 y.o. Female) Treating RN: Carolyne Fiscal, Debi Primary Care Physician: [name] Other Clinician: Referring Physician: [name] Treating Physician/Extender: Frann Rider in Treatment: 3 Visit Information History Since Last Visit All ordered tests and consults were completed: No Patient Arrived: Gilford Rile Added or deleted any medications: No Arrival Time: 12:50 Any new allergies or adverse reactions: No Accompanied By: husband Had a fall or experienced change in No Transfer Assistance: [employer] activities of daily living that may affect Patient Lift risk of falls: Patient Identification Verified: Yes Signs or symptoms of abuse/neglect since last No Secondary Verification Process Yes visito Completed: Hospitalized since last visit: No Patient Requires Transmission- No Pain Present Now: No Based Precautions: Patient Has Alerts: No Electronic Signature(s) Signed: 06/11/2015 6:42:13 PM By: Alric Quan Entered By: Alric Quan on 06/11/2015 12:51:12 [name] ([id number]) -------------------------------------------------------------------------------- Clinic Level of Care Assessment Details Patient Name: [name] Date of Service: 06/11/2015 12:45 PM Medical Record Number: [id number] Patient Account Number: [id number] Date of Birth/Sex: Jan 19, 1929 (82 y.o. Female) Treating RN: Carolyne Fiscal, Debi Primary Care Physician: [name] Other Clinician: Referring Physician: [name] Treating Physician/Extender: Frann Rider in Treatment: 3 Clinic Level of Care Assessment Items TOOL 4 Quantity Score X - Use when only an EandM is performed on FOLLOW-UP visit 1 0 ASSESSMENTS - Nursing  Assessment / Reassessment X - Reassessment of Co-morbidities (includes updates in patient status) 1 10 X - Reassessment of Adherence to Treatment Plan 1 5 ASSESSMENTS - Wound and Skin Assessment / Reassessment X - Simple Wound Assessment / Reassessment - one wound 1 5 []  - Complex Wound Assessment / Reassessment - multiple wounds 0 []  - Dermatologic / Skin Assessment (not related to wound area) 0 ASSESSMENTS - Focused Assessment []  - Circumferential Edema Measurements - multi extremities 0 []  - Nutritional Assessment / Counseling / Intervention 0 []  - Lower Extremity Assessment (monofilament, tuning fork, pulses) 0 []  - Peripheral Arterial Disease Assessment (using hand held doppler) 0 ASSESSMENTS - Ostomy and/or Continence Assessment and Care []  - Incontinence Assessment and Management 0 []  - Ostomy Care Assessment and Management (repouching, etc.) 0 PROCESS - Coordination of Care X - Simple Patient / Family Education for ongoing care 1 15 []  - Complex (extensive) Patient / Family Education for ongoing care 0 []  - Staff obtains Programmer, systems, Records, Test Results / Process Orders 0 []  - Staff telephones HHA, Nursing Homes / Clarify orders / etc 0 []  - Routine Transfer to another Facility (non-emergent condition) 0 Busker, Amylah N. ([id number]) []  - Routine Hospital Admission (non-emergent condition) 0 []  - New Admissions / Biomedical engineer / Ordering NPWT, Apligraf, etc. 0 []  - Emergency Hospital Admission (emergent condition) 0 X - Simple Discharge Coordination 1 10 []  - Complex (extensive) Discharge Coordination 0 PROCESS - Special Needs []  - Pediatric / Minor Patient Management 0 []  - Isolation Patient Management 0 []  - Hearing / Language / Visual special needs 0 []  - Assessment of Community assistance (transportation, D/C planning, etc.) 0 []  - Additional assistance / Altered mentation 0 []  - Support Surface(s) Assessment (bed, cushion, seat, etc.) 0 INTERVENTIONS - Wound  Cleansing / Measurement X - Simple Wound Cleansing - one wound 1 5 []  - Complex Wound Cleansing - multiple wounds 0 X - Wound Imaging (photographs - any number of wounds)  1 5 []  - Wound Tracing (instead of photographs) 0 X - Simple Wound Measurement - one wound 1 5 []  - Complex Wound Measurement - multiple wounds 0 INTERVENTIONS - Wound Dressings X - Small Wound Dressing one or multiple wounds 1 10 []  - Medium Wound Dressing one or multiple wounds 0 []  - Large Wound Dressing one or multiple wounds 0 X - Application of Medications - topical 1 5 []  - Application of Medications - injection 0 INTERVENTIONS - Miscellaneous []  - External ear exam 0 Pruiett, Nayra N. ([id number]) []  - Specimen Collection (cultures, biopsies, blood, body fluids, etc.) 0 []  - Specimen(s) / Culture(s) sent or taken to Lab for analysis 0 []  - Patient Transfer (multiple staff / Harrel Lemon Lift / Similar devices) 0 []  - Simple Staple / Suture removal (25 or less) 0 []  - Complex Staple / Suture removal (26 or more) 0 []  - Hypo / Hyperglycemic Management (close monitor of Blood Glucose) 0 []  - Ankle / Brachial Index (ABI) - do not check if billed separately 0 X - Vital Signs 1 5 Has the patient been seen at the hospital within the last three years: Yes Total Score: 80 Level Of Care: New/Established - Level 3 Electronic Signature(s) Signed: 06/11/2015 6:42:13 PM By: Alric Quan Entered By: Alric Quan on 06/11/2015 14:41:47 [name] ([id number]) -------------------------------------------------------------------------------- Encounter Discharge Information Details Patient Name: [name] Date of Service: 06/11/2015 12:45 PM Medical Record Number: [id number] Patient Account Number: [id number] Date of Birth/Sex: 06-Jun-1928 (82 y.o. Female) Treating RN: Carolyne Fiscal, Debi Primary Care Physician: [name] Other Clinician: Referring Physician: [name] Treating Physician/Extender: Frann Rider in Treatment: 3 Encounter Discharge Information Items Discharge Pain Level: 0 Discharge Condition: Stable Ambulatory Status: Walker Discharge Destination: Home Transportation: Private Auto Accompanied By: husband Schedule Follow-up Appointment: Yes Medication Reconciliation completed Yes and provided to Patient/Care Talecia Sherlin: Provided on Clinical Summary of Care: 06/11/2015 Form Type Recipient Paper Patient [facility] Electronic Signature(s) Signed: 06/11/2015 1:15:10 PM By: Ruthine Dose Entered By: Ruthine Dose on 06/11/2015 13:15:09 [name] ([id number]) -------------------------------------------------------------------------------- Lower Extremity Assessment Details Patient Name: [name] Date of Service: 06/11/2015 12:45 PM Medical Record Number: [id number] Patient Account Number: [id number] Date of Birth/Sex: 02/22/1928 (82 y.o. Female) Treating RN: Carolyne Fiscal, Debi Primary Care Physician: [name] Other Clinician: Referring Physician: [name] Treating Physician/Extender: Frann Rider in Treatment: 3 Vascular Assessment Pulses: Posterior Tibial Dorsalis Pedis Palpable: [Right:Yes] Extremity colors, hair growth, and conditions: Extremity Color: [Right:Mottled] Temperature of Extremity: [Right:Warm] Capillary Refill: [Right:< 3 seconds] Toe Nail Assessment Left: Right: Thick: No Discolored: No Deformed: No Improper Length and Hygiene: No Electronic Signature(s) Signed: 06/11/2015 6:42:13 PM By: Alric Quan Entered By: Alric Quan on 06/11/2015 14:41:12 [name] ([id number]) -------------------------------------------------------------------------------- Multi Wound Chart Details Patient Name: [name] Date of Service: 06/11/2015 12:45 PM Medical Record Number: [id number] Patient Account Number: [id number] Date of Birth/Sex: May 11, 1928 (82 y.o. Female) Treating RN: Carolyne Fiscal, Debi Primary Care  Physician: [name] Other Clinician: Referring Physician: [name] Treating Physician/Extender: Frann Rider in Treatment: 3 Vital Signs Height(in): 61 Pulse(bpm): 67 Weight(lbs): 142 Blood Pressure 135/56 (mmHg): Body Mass Index(BMI): 27 Temperature(F): 98.2 Respiratory Rate 20 (breaths/min): Photos: [1:No Photos] [2:No Photos] [N/A:N/A] Wound Location: [1:Right Lower Leg - Lateral, Proximal] [2:Right Lower Leg - Lateral, Distal] [N/A:N/A] Wounding Event: [1:Trauma] [2:Trauma] [N/A:N/A] Primary Etiology: [1:Trauma, Other] [2:Trauma, Other] [N/A:N/A] Comorbid History: [1:Anemia, Hypertension, Osteoarthritis, Received Chemotherapy] [2:Anemia, Hypertension, Osteoarthritis, Received Chemotherapy] [N/A:N/A] Date Acquired: [1:04/20/2015] [2:04/20/2015] [N/A:N/A] Weeks  of Treatment: [1:3] [2:3] [N/A:N/A] Wound Status: [1:Open] [2:Open] [N/A:N/A] Measurements L x W x D 1.7x0.5x0.1 [2:0.1x0.1x0.1] [N/A:N/A] (cm) Area (cm) : [1:0.668] [2:0.008] [N/A:N/A] Volume (cm) : [1:0.067] [2:0.001] [N/A:N/A] % Reduction in Area: [1:87.30%] [2:97.50%] [N/A:N/A] % Reduction in Volume: 87.30% [2:96.80%] [N/A:N/A] Classification: [1:Partial Thickness] [2:Partial Thickness] [N/A:N/A] Exudate Amount: [1:Large] [2:None Present] [N/A:N/A] Exudate Type: [1:Serosanguineous] [2:N/A] [N/A:N/A] Exudate Color: [1:red, brown] [2:N/A] [N/A:N/A] Wound Margin: [1:Flat and Intact] [2:Flat and Intact] [N/A:N/A] Granulation Amount: [1:Large (67-100%)] [2:None Present (0%)] [N/A:N/A] Granulation Quality: [1:Pink] [2:N/A] [N/A:N/A] Necrotic Amount: [1:Small (1-33%)] [2:None Present (0%)] [N/A:N/A] Exposed Structures: [1:Fascia: No Fat: No Tendon: No Muscle: No Joint: No] [2:Fascia: No Fat: No Tendon: No Muscle: No Joint: No] [N/A:N/A] Bone: No Bone: No Limited to Skin Limited to Skin Breakdown Breakdown Epithelialization: None Large (67-100%) N/A Periwound Skin Texture: Edema: Yes Edema: Yes  N/A Periwound Skin Moist: Yes Moist: No N/A Moisture: Periwound Skin Color: No Abnormalities Noted No Abnormalities Noted N/A Temperature: No Abnormality No Abnormality N/A Tenderness on Yes Yes N/A Palpation: Wound Preparation: Ulcer Cleansing: Ulcer Cleansing: N/A Rinsed/Irrigated with Rinsed/Irrigated with Saline Saline Topical Anesthetic Topical Anesthetic Applied: Other: lidocaine Applied: Other: lidocaine 4% 4% Treatment Notes Electronic Signature(s) Signed: 06/11/2015 6:42:13 PM By: Alric Quan Entered By: Alric Quan on 06/11/2015 12:59:00 [name] ([id number]) -------------------------------------------------------------------------------- [place] Details Patient Name: [name] Date of Service: 06/11/2015 12:45 PM Medical Record Number: [id number] Patient Account Number: [id number] Date of Birth/Sex: March 12, 1928 (82 y.o. Female) Treating RN: Carolyne Fiscal, Debi Primary Care Physician: [name] Other Clinician: Referring Physician: [name] Treating Physician/Extender: Frann Rider in Treatment: 3 Active Inactive Abuse / Safety / Falls / Self Care Management Nursing Diagnoses: Potential for falls Goals: Patient will remain injury free Date Initiated: 05/21/2015 Goal Status: Active Interventions: Assess fall risk on admission and as needed Notes: Nutrition Nursing Diagnoses: Imbalanced nutrition Goals: Patient/caregiver agrees to and verbalizes understanding of need to use nutritional supplements and/or vitamins as prescribed Date Initiated: 05/21/2015 Goal Status: Active Interventions: Assess patient nutrition upon admission and as needed per policy Notes: Orientation to the Wound Care Program Nursing Diagnoses: Knowledge deficit related to the wound healing center program Goals: Patient/caregiver will verbalize understanding of the [place], Aria N. ([id number]) Date  Initiated: 05/21/2015 Goal Status: Active Interventions: Provide education on orientation to the wound center Notes: Pain, Acute or Chronic Nursing Diagnoses: Pain, acute or chronic: actual or potential Potential alteration in comfort, pain Goals: Patient/caregiver will verbalize adequate pain control between visits Date Initiated: 05/21/2015 Goal Status: Active Patient/caregiver will verbalize comfort level met Date Initiated: 05/21/2015 Goal Status: Active Interventions: Complete pain assessment as per visit requirements Notes: Wound/Skin Impairment Nursing Diagnoses: Impaired tissue integrity Goals: Ulcer/skin breakdown will have a volume reduction of 30% by week 4 Date Initiated: 05/21/2015 Goal Status: Active Ulcer/skin breakdown will have a volume reduction of 50% by week 8 Date Initiated: 05/21/2015 Goal Status: Active Ulcer/skin breakdown will have a volume reduction of 80% by week 12 Date Initiated: 05/21/2015 Goal Status: Active Interventions: Assess patient/caregiver ability to obtain necessary supplies Assess ulceration(s) every visit TIMI, REESER ([id number]) Notes: Electronic Signature(s) Signed: 06/11/2015 6:42:13 PM By: Alric Quan Entered By: Alric Quan on 06/11/2015 12:58:52 [name] ([id number]) -------------------------------------------------------------------------------- Pain Assessment Details Patient Name: [name] Date of Service: 06/11/2015 12:45 PM Medical Record Number: [id number] Patient Account Number: [id number] Date of Birth/Sex: 02/27/1928 (82 y.o. Female) Treating RN: Carolyne Fiscal, Debi Primary Care Physician: [name] Other Clinician:  Referring Physician: [name] Treating Physician/Extender: Frann Rider in Treatment: 3 Active Problems Location of Pain Severity and Description of Pain Patient Has Paino No Site Locations Pain Management and Medication Current Pain Management: Electronic  Signature(s) Signed: 06/11/2015 6:42:13 PM By: Alric Quan Entered By: Alric Quan on 06/11/2015 12:51:17 [name] ([id number]) -------------------------------------------------------------------------------- Patient/Caregiver Education Details Patient Name: [name] Date of Service: 06/11/2015 12:45 PM Medical Record Number: [id number] Patient Account Number: [id number] Date of Birth/Gender: [birth year] (82 y.o. Female) Treating RN: Carolyne Fiscal, Debi Primary Care Physician: [name] Other Clinician: Referring Physician: [name] Treating Physician/Extender: Frann Rider in Treatment: 3 Education Assessment Education Provided To: Patient Education Topics Provided Wound/Skin Impairment: Handouts: Other: change dressing as ordered Methods: Demonstration, Explain/Verbal Responses: State content correctly Electronic Signature(s) Signed: 06/11/2015 6:42:13 PM By: Alric Quan Entered By: Alric Quan on 06/11/2015 13:06:48 [name] ([id number]) -------------------------------------------------------------------------------- Wound Assessment Details Patient Name: [name] Date of Service: 06/11/2015 12:45 PM Medical Record Number: [id number] Patient Account Number: [id number] Date of Birth/Sex: 04/22/1928 (82 y.o. Female) Treating RN: Carolyne Fiscal, Debi Primary Care Physician: [name] Other Clinician: Referring Physician: [name] Treating Physician/Extender: Frann Rider in Treatment: 3 Wound Status Wound Number: 1 Primary Trauma, Other Etiology: Wound Location: Right Lower Leg - Lateral, Proximal Wound Open Status: Wounding Event: Trauma Comorbid Anemia, Hypertension, Osteoarthritis, Date Acquired: 04/20/2015 History: Received Chemotherapy Weeks Of Treatment: 3 Clustered Wound: No Photos Photo Uploaded By: Alric Quan on 06/11/2015 16:55:22 Wound Measurements Length: (cm) 1.7 % Reduction  in Width: (cm) 0.5 % Reduction in Depth: (cm) 0.1 Epithelializati Area: (cm) 0.668 Tunneling: Volume: (cm) 0.067 Undermining: Area: 87.3% Volume: 87.3% on: None No No Wound Description Classification: Partial Thickness Wound Margin: Flat and Intact Exudate Amount: Large Exudate Type: Serosanguineous Exudate Color: red, brown Foul Odor After Cleansing: No Wound Bed Granulation Amount: Large (67-100%) Exposed Structure Granulation Quality: Pink Fascia Exposed: No Necrotic Amount: Small (1-33%) Fat Layer Exposed: No Estupinan, Adrieanna N. ([id number]) Necrotic Quality: Adherent Slough Tendon Exposed: No Muscle Exposed: No Joint Exposed: No Bone Exposed: No Limited to Skin Breakdown Periwound Skin Texture Texture Color No Abnormalities Noted: No No Abnormalities Noted: No Localized Edema: Yes Temperature / Pain Moisture Temperature: No Abnormality No Abnormalities Noted: No Tenderness on Palpation: Yes Moist: Yes Wound Preparation Ulcer Cleansing: Rinsed/Irrigated with Saline Topical Anesthetic Applied: Other: lidocaine 4%, Treatment Notes Wound #1 (Right, Proximal, Lateral Lower Leg) 1. Cleansed with: Clean wound with Normal Saline 2. Anesthetic Topical Lidocaine 4% cream to wound bed prior to debridement 3. Peri-wound Care: Skin Prep 4. Dressing Applied: Prisma Ag 5. Secondary Dressing Applied Bordered Foam Dressing Electronic Signature(s) Signed: 06/11/2015 6:42:13 PM By: Alric Quan Entered By: Alric Quan on 06/11/2015 12:58:21 [name] ([id number]) -------------------------------------------------------------------------------- Wound Assessment Details Patient Name: [name] Date of Service: 06/11/2015 12:45 PM Medical Record Number: [id number] Patient Account Number: [id number] Date of Birth/Sex: 28-Dec-1928 (82 y.o. Female) Treating RN: Carolyne Fiscal, Debi Primary Care Physician: [name] Other Clinician: Referring Physician:  [name] Treating Physician/Extender: Frann Rider in Treatment: 3 Wound Status Wound Number: 2 Primary Trauma, Other Etiology: Wound Location: Right, Distal, Lateral Lower Leg Wound Healed - Epithelialized Wounding Event: Trauma Status: Date Acquired: 04/20/2015 Comorbid Anemia, Hypertension, Osteoarthritis, Weeks Of Treatment: 3 History: Received Chemotherapy Clustered Wound: No Photos Photo Uploaded By: Alric Quan on 06/11/2015 16:55:30 Wound Measurements Length: (cm) 0 % Reductio Width: (cm) 0 % Reductio Depth: (cm) 0 Epithelial Area: (cm) 0 Tunneling Volume: (cm) 0  Undermini n in Area: 100% n in Volume: 100% ization: Large (67-100%) : No ng: No Wound Description Classification: Partial Thickness Wound Margin: Flat and Intact Exudate Amount: None Present Foul Odor After Cleansing: No Wound Bed Granulation Amount: None Present (0%) Exposed Structure Necrotic Amount: None Present (0%) Fascia Exposed: No Fat Layer Exposed: No Tendon Exposed: No Muscle Exposed: No Joint Exposed: No [place], Elsey N. ([id number]) Bone Exposed: No Limited to Skin Breakdown Periwound Skin Texture Texture Color No Abnormalities Noted: No No Abnormalities Noted: No Localized Edema: Yes Temperature / Pain Moisture Temperature: No Abnormality No Abnormalities Noted: No Tenderness on Palpation: Yes Moist: No Wound Preparation Ulcer Cleansing: Rinsed/Irrigated with Saline Topical Anesthetic Applied: Other: lidocaine 4%, Electronic Signature(s) Signed: 06/11/2015 6:42:13 PM By: Alric Quan Entered By: Alric Quan on 06/11/2015 13:04:49 [name] ([id number]) -------------------------------------------------------------------------------- Vitals Details Patient Name: [name] Date of Service: 06/11/2015 12:45 PM Medical Record Number: [id number] Patient Account Number: [id number] Date of Birth/Sex: 1928/11/26 (82 y.o. Female) Treating RN:  Carolyne Fiscal, Debi Primary Care Physician: [name] Other Clinician: Referring Physician: [name] Treating Physician/Extender: Frann Rider in Treatment: 3 Vital Signs Time Taken: 12:51 Temperature (F): 98.2 Height (in): 61 Pulse (bpm): 67 Weight (lbs): 142 Respiratory Rate (breaths/min): 20 Body Mass Index (BMI): 26.8 Blood Pressure (mmHg): 135/56 Reference Range: 80 - 120 mg / dl Electronic Signature(s) Signed: 06/11/2015 6:42:13 PM By: Alric Quan Entered By: Alric Quan on 06/11/2015 12:53:00

## 2015-06-12 NOTE — H&P (Cosign Needed)
[name] at [place] NAME: [name]    MR#:  [id number]  DATE OF BIRTH:  Oct 04, 1928  DATE OF ADMISSION:  06/11/2015  PRIMARY CARE PHYSICIAN: [name], MD   REQUESTING/REFERRING PHYSICIAN: Dr. Burlene Arnt  CHIEF COMPLAINT:   Chief Complaint  Patient presents with  . Abdominal Pain  . Nausea  . Emesis    HISTORY OF PRESENT ILLNESS: [name]  is a 82 y.o. female with a known history of [name] is a very pleasant 82 year old Caucasian female with past medical history of colon cancer status post colectomy, small bowel obstruction status post adhesion surgery, Barrett's esophagus, hypertension, hyperlipidemia, chronic back pain, peripheral neuropathy. Results to the ED today due to an acute onset of abdominal pain with nausea and vomiting. Patient states that when she returned from physical therapy today at about 5 PM, she ate fish and Fridays and subsequently develops abdominal pain. Abdominal pain was generalized with no radiation and was rated at 10 over 10 at its worse. It was initially intermittent but later became constant. It was associated with an urge to defecate without any actual significant bowel movements. He reports a decrease in bowel movements for the last few days. She had nausea with and vomited several times. She denies any travel, fever, consumption of unusual food or contact with sick persons. She denies hematochezia or melanotic stools. She denies any dizziness, syncope, confusion or fall. She does not have any chest pain, shortness of breath, palpitations, lightheadedness or blurry vision. She denies generalized weakness. Patient reports a similar episode of abdominal pain about 2 weeks ago but states that abdominal pain was not this severe at that time. She also had nausea and vomiting at that time and had generalized weakness for 2 days.  On arrival in the ED today, but pressure was found to be 1/76 and respiratory  rate was 23. CBC was significant for a white count of 11.1 and CMP was significant for a potassium of 3.4 with a creatinine of 1.2 from a baseline of 1.04. INR was normal and urinalysis is pending. Troponin, lactic acid and lipase were normal. CT of the abdomen was consistent with acute ileitis and EKG showed sinus rhythm with left axis deviation as well as low voltage with old anterior infarct. Chest x-ray showed paucity of small bowel gas (no acute intrathoracic process. Patient was given fentanyl, morphine, Zofran, gentle bolus in the ED and will be admitted due to acute ileitis with accelerated hypertension as well as hypokalemia.  She is DO NOT INTUBATE DO NOT RESUSCITATE  PAST MEDICAL HISTORY:   Past Medical History  Diagnosis Date  . Bowel obstruction (HCC)     s/p adhesion resection  . Recurrent sinus infections   . Vertigo   . Barrett's esophagus   . Hiatal hernia   . Hypercholesteremia   . Hypertension   . Chronic back pain   . Peripheral neuropathy ([place])   . Colon cancer ([place]) 1988 and 1989    adenocarcinoma, s/p resection x  2 and chemo  . S/P chemotherapy, time since greater than 12 weeks     colon cancer  . Diverticulitis   . Lumbar scoliosis   . Renal cyst   . H/O open leg wound     PAST SURGICAL HISTORY:  Past Surgical History  Procedure Laterality Date  . Colon resection      x2.  s/p colon cancer  . Adhesions resected  bowel obstruction  . Cholecystectomy    . Abdominal hysterectomy  1982  . Appendectomy    . Excisional hemorrhoidectomy      with tubal ligation  . Breast biopsy Left     negative 06/14/1985    SOCIAL HISTORY:  Social History  Substance Use Topics  . Smoking status: Never Smoker   . Smokeless tobacco: Never Used  . Alcohol Use: No    FAMILY HISTORY:  Family History  Problem Relation Age of Onset  . Breast cancer Mother   . Stroke Father   . Hypertension Father   . Diabetes Father   . Asthma Mother   . Ovarian cancer Sister      x2  . Prostate cancer Brother   . Hypertension Brother     x3  . Hypercholesterolemia Brother     x3  . Diabetes Brother     DRUG ALLERGIES: No Known Allergies  REVIEW OF SYSTEMS:   CONSTITUTIONAL: No fever. EYES: No blurred or double vision.  EARS, NOSE, AND THROAT: No tinnitus or ear pain.  RESPIRATORY: No cough, shortness of breath, wheezing or hemoptysis.  CARDIOVASCULAR: No chest pain, orthopnea, edema.  GASTROINTESTINAL: As in history of present illness GENITOURINARY: No dysuria, hematuria.  ENDOCRINE: No polyuria, nocturia,  HEMATOLOGY: No anemia, easy bruising or bleeding SKIN: No rash or lesion. MUSCULOSKELETAL: No joint pain or arthritis.   NEUROLOGIC: No tingling, numbness, weakness.  PSYCHIATRY: No anxiety or depression.   MEDICATIONS AT HOME:  Prior to Admission medications   Medication Sig Start Date End Date Taking? Authorizing Provider  Calcium Carbonate-Vitamin D (CALCIUM 600 + D PO) Take 600 Units by mouth 2 (two) times daily with a meal.    Yes Historical Provider, MD  estradiol (ESTRACE) 2 MG tablet TAKE 1 TABLET EVERY DAY 11/27/13  Yes [name], MD  fexofenadine (ALLEGRA) 60 MG tablet TAKE 1 TABLET EVERY DAY 06/20/14  Yes [name], MD  fish oil-omega-3 fatty acids 1000 MG capsule Take 2 g by mouth daily.   Yes Historical Provider, MD  lactase (LACTAID) 3000 UNITS tablet Take 1 tablet by mouth as needed.   Yes Historical Provider, MD  lidocaine (LIDODERM) 5 % Place 3 patches onto the skin daily. Remove & Discard patch within 12 hours or as directed by MD   Yes Historical Provider, MD  lisinopril-hydrochlorothiazide (PRINZIDE,ZESTORETIC) 10-12.5 MG tablet Take 1 tablet by mouth daily.   Yes Historical Provider, MD  meclizine (ANTIVERT) 25 MG tablet Take 25 mg by mouth 3 (three) times daily as needed.   Yes Historical Provider, MD  meloxicam (MOBIC) 7.5 MG tablet Take 7.5 mg by mouth daily.   Yes Historical Provider, MD  metoCLOPramide (REGLAN) 5  MG tablet Take 5 mg by mouth. Take one tablet three times a day   Yes Historical Provider, MD  metoprolol succinate (TOPROL-XL) 25 MG 24 hr tablet Take 1 tablet (25 mg total) by mouth daily. 12/15/14  Yes [name], MD  oxyCODONE (OXY IR/ROXICODONE) 5 MG immediate release tablet Take 5 mg by mouth 3 (three) times daily.    Yes Historical Provider, MD  pantoprazole (PROTONIX) 40 MG tablet Take 40 mg by mouth 2 (two) times daily.   Yes Historical Provider, MD  simvastatin (ZOCOR) 10 MG tablet TAKE 1 TABLET AT BEDTIME 03/18/15  Yes [name], MD  traMADol-acetaminophen (ULTRACET) 37.5-325 MG per tablet Take 1 tablet by mouth 2 (two) times daily as needed.    Yes Historical Provider, MD  losartan-hydrochlorothiazide (  HYZAAR) 50-12.5 MG tablet TAKE 1 TABLET BY MOUTH DAILY. Patient not taking: Reported on 06/11/2015 03/09/15   [name], MD      PHYSICAL EXAMINATION:   VITAL SIGNS: Blood pressure 155/72, pulse 62, temperature 98.9 F (37.2 C), temperature source Oral, resp. rate 12, height 5\' 1"  (1.549 m), weight 67.132 kg (148 lb), SpO2 91 %.  GENERAL:  82 y.o.-year-old patient lying in the bed with no acute distress. Alert and oriented x 3. EYES: Pupils equal, round, reactive to light and accommodation. No scleral icterus. Extraocular muscles intact.  HEENT: Head atraumatic, normocephalic. Oropharynx and nasopharynx clear.  NECK:  Supple, no jugular venous distention. No thyroid enlargement, no tenderness.  LUNGS: Normal breath sounds bilaterally, no wheezing, rales,rhonchi or crepitation. No use of accessory muscles of respiration.  CARDIOVASCULAR: S1, S2 normal. No murmurs, rubs, or gallops.  ABDOMEN: Soft, generalized tenderness to moderate palpation with mild guarding. nondistended. Bowel sounds present. No organomegaly or mass.  EXTREMITIES: No pedal edema, cyanosis, or clubbing.  NEUROLOGIC: Cranial nerves II through XII are intact. Muscle strength 5/5 in all extremities.  Sensation intact. Gait not checked.   SKIN: No obvious rash, lesion, or ulcer.   LABORATORY PANEL:   CBC  Recent Labs Lab 06/11/15 2028  WBC 11.1*  HGB 14.1  HCT 41.2  PLT 305  MCV 95.3  MCH 32.6  MCHC 34.1  RDW 13.6   ------------------------------------------------------------------------------------------------------------------  Chemistries   Recent Labs Lab 06/11/15 2028  NA 138  K 3.4*  CL 99*  CO2 29  GLUCOSE 112*  BUN 29*  CREATININE 1.20*  CALCIUM 10.2  AST 23  ALT 17  ALKPHOS 50  BILITOT 1.3*   ------------------------------------------------------------------------------------------------------------------ estimated creatinine clearance is 30.6 mL/min (by C-G formula based on Cr of 1.2). ------------------------------------------------------------------------------------------------------------------ No results for input(s): TSH, T4TOTAL, T3FREE, THYROIDAB in the last 72 hours.  Invalid input(s): FREET3   Coagulation profile  Recent Labs Lab 06/11/15 2028  INR 0.91   ------------------------------------------------------------------------------------------------------------------- No results for input(s): DDIMER in the last 72 hours. -------------------------------------------------------------------------------------------------------------------  Cardiac Enzymes  Recent Labs Lab 06/11/15 2028  TROPONINI <0.03   ------------------------------------------------------------------------------------------------------------------ Invalid input(s): POCBNP  ---------------------------------------------------------------------------------------------------------------  Urinalysis    Component Value Date/Time   COLORURINE YELLOW 03/06/2014 [place] 03/06/2014 1209   LABSPEC 1.015 03/06/2014 1209   PHURINE 7.0 03/06/2014 1209   GLUCOSEU NEGATIVE 03/06/2014 1209   HGBUR NEGATIVE 03/06/2014 1209   BILIRUBINUR NEGATIVE  03/06/2014 1209   KETONESUR NEGATIVE 03/06/2014 1209   UROBILINOGEN 0.2 03/06/2014 1209   NITRITE NEGATIVE 03/06/2014 1209   LEUKOCYTESUR NEGATIVE 03/06/2014 1209     RADIOLOGY: Ct Abdomen Pelvis W Contrast  06/11/2015  CLINICAL DATA:  Acute onset of nausea, vomiting and abdominal tenderness. Initial encounter. EXAM: CT ABDOMEN AND PELVIS WITH CONTRAST TECHNIQUE: Multidetector CT imaging of the abdomen and pelvis was performed using the standard protocol following bolus administration of intravenous contrast. CONTRAST:  88mL ISOVUE-300 IOPAMIDOL (ISOVUE-300) INJECTION 61% COMPARISON:  MRI of the lumbar spine performed 01/20/2015, and CT of the abdomen and pelvis from 02/06/2008 FINDINGS: The visualized lung bases are clear. A moderate hiatal hernia is noted. The liver is unremarkable in appearance. Numerous calcified granulomata are noted within the spleen. The patient is status post cholecystectomy, with clips noted at the gallbladder fossa. The pancreas and adrenal glands are unremarkable. A 4.9 cm cyst is noted at the upper pole of the left kidney, demonstrating minimal peripheral wall thickening. This has decreased in size from 2009. The kidneys  are otherwise grossly unremarkable. There is no evidence of hydronephrosis. No renal or ureteral stones are identified. No perinephric stranding is seen. There is distention of small-bowel loops to 3.0 cm in maximal diameter, aside from more prominent chronic distention of a small bowel suture line at the right lower quadrant. There is gradual decompression, with mild wall thickening along the distal ileum, and surrounding trace fluid and soft tissue inflammation. There is mild fecalization at this region. Findings are concerning for acute infectious or inflammatory ileitis. No focal transition point is seen. The stomach is within normal limits. No acute vascular abnormalities are seen. Postoperative change is noted about the right lower quadrant, with an  ileocolic anastomosis along the ascending colon. The colon is otherwise unremarkable. The bladder is moderately distended and grossly unremarkable. The patient is status post hysterectomy. No suspicious adnexal masses are seen. No inguinal lymphadenopathy is seen. No acute osseous abnormalities are identified. Left convex lumbar scoliosis is noted. Multilevel vacuum phenomenon is noted along the lower thoracic and lumbar spine. IMPRESSION: 1. Gradual decompression of small bowel, with mild wall thickening along the distal ileum, and surrounding trace fluid and soft tissue inflammation. Mild fecalization noted at this region. Findings concerning for acute infectious or inflammatory ileitis. No focal transition point seen to suggest bowel obstruction. 2. Moderate hiatal hernia noted. 3. Large left renal cyst has decreased in size from 2009. Minimal peripheral wall thickening at the cyst is also stable. 4. Left convex lumbar scoliosis noted. Mild degenerative change along the lower thoracic and lumbar spine. Electronically Signed   By: Garald Balding M.D.   On: 06/11/2015 22:24   Dg Abd Acute W/chest  06/11/2015  CLINICAL DATA:  Patient with severe abdominal pain. Nausea and vomiting. EXAM: DG ABDOMEN ACUTE W/ 1V CHEST COMPARISON:  Chest radiograph 01/13/2015 FINDINGS: Patient is rotated to the left. Stable cardiac and mediastinal contours. No consolidative pulmonary opacities. No pleural effusion or pneumothorax. Relative paucity of bowel gas. Postsurgical changes within the right hemi abdomen. No free intraperitoneal air. Leftward curvature of the lumbar spine with associated degenerative changes. IMPRESSION: Paucity of small bowel gas. No acute cardiopulmonary process. Electronically Signed   By: Lovey Newcomer M.D.   On: 06/11/2015 21:14    EKG: Orders placed or performed during the hospital encounter of 06/11/15  . ED EKG  . ED EKG    ASSESSMENT  Principal Problem:   Acute Ileitis Active Problems:    Hypokalemia   Accelerated hypertension  PLAN   1). Acute ileitis - Patient has a complex GI history including colon cancer status post resection and small bowel obstruction status post adhesion lysis. - She presents today with acute onset of abdominal pain, nausea and vomiting and CT scan showed evidence of acute ileitis. - Patient was given fentanyl, morphine and Zofran in the ED. We will continue morphine and Zofran and and pantoprazole IV. - Start patient on ciprofloxacin and metronidazole - Continue other supportive measures.  2). Accelerated hypertension - Patient presented with a blood pressure of 201/76 but this is improved to 146/62 without any antihypertensive. Elevated blood pressure is likely related to pain. - Continue home medications of metoprolol, lisinopril and HCTZ - When necessary IV hydralazine for sustained elevated blood pressure.  3). Hypokalemia - Patient has a potassium of 3.4. We will replete potassium by mouth and check magnesium levels. - Monitor potassium levels and replete as necessary.  All the records are reviewed and case discussed with ED provider. Management plans discussed with  the patient, family and they are in agreement.  CODE STATUS: Code Status History    This patient does not have a recorded code status. Please follow your organizational policy for patients in this situation.      I have independently reviewed all EKG and chest x-ray data  VTE prophylaxis: Heparin if no contraindications and patient at low risk for bleeding. SCD's and progressive ambulation if patient has contraindications to anticoagulants. No DVT prophylaxis if patient presently receiving therapeutic anticoagulation or is at significant risk of bleeding for which the risk of anticoagulation outweigh the potential benefits.  Vaccinations: Pneumonia & flu vaccine per hospital protocol Prevention: Will proceed with conservative measures for the prevention of delirium in  patients older than 65. Fall precautions and 1:1 sitter as needed per hospital protocol.  TOTAL TIME TAKING CARE OF THIS PATIENT: 50 minutes.    Sylvan Cheese M.D on 06/12/2015 at 12:55 AM  Between 7am to 6pm - Pager - [phone]  After 6pm go to [email or web address] - password [email or web address] [facility]  Office  [phone]  CC: Primary care physician; [name], MD

## 2015-06-12 NOTE — Progress Notes (Signed)
[name] ([id number]) Visit Report for 06/11/2015 Chief Complaint Document Details Patient Name: [name], [name] 06/11/2015 12:45 Date of Service: PM Medical Record [id number] Number: Patient Account Number: [id number] October 22, 1928 (82 y.o. Treating RN: [name] Date of Birth/Sex: Female) Other Clinician: Primary Care Physician: [name] Treating [name] Referring Physician: [name] Physician/Extender: [name] in Treatment: 3 Information Obtained from: Patient Chief Complaint Patient presents to the wound care center for a consult due non healing wound which she's had for about 2 months Electronic Signature(s) Signed: 06/11/2015 1:11:13 PM By: [name] MD, FACS Entered By: [name] on 06/11/2015 13:11:13 [name] ([id number]) -------------------------------------------------------------------------------- HPI Details Patient Name: [name] 06/11/2015 12:45 Date of Service: PM Medical Record [id number] Number: Patient Account Number: [id number] 20-Oct-1928 (82 y.o. Treating RN: [name] Date of Birth/Sex: Female) Other Clinician: Primary Care Physician: [name] Treating [name] Referring Physician: [name] Physician/Extender: Weeks in Treatment: 3 History of Present Illness Location: right lateral calf area Quality: Patient reports No Pain. Severity: Patient states wound are getting worse. Duration: Patient has had the wound for > 2 months prior to seeking treatment at the wound center Context: The wound occurred when the patient injured her leg and bruised it against a blunt object Modifying Factors: Consults to this date include:local care and no antibiotics Associated Signs and Symptoms: Patient reports having increase swelling. HPI Description: 82 year old patient who had a blunt injury to her right lower extremity about a few weeks ago now noticed that the wound had opened out and started  draining fluid. She is known to have chronic lower extremity edema which has been there for several years. The PCP feels that the edema has not related to anemia or CHF. The patient's past medical history significant for adenocarcinoma of the colon status post resection, bowel obstruction with adhesions, essential hypertension, vertigo,chronic back pain, status post cholecystectomy, hysterectomy, appendectomy, colon surgery, bowel adhesion resection, hemorrhoid surgery. She has never been a smoker. Electronic Signature(s) Signed: 06/11/2015 1:11:17 PM By: [name] MD, FACS Entered By: [name] on 06/11/2015 13:11:17 [name] ([id number]) -------------------------------------------------------------------------------- Physical Exam Details Patient Name: [name] 06/11/2015 12:45 Date of Service: PM Medical Record [id number] Number: Patient Account Number: [id number] [birth year] (82 y.o. Treating RN: [name] Date of Birth/Sex: Female) Other Clinician: Primary Care Physician: [name] Treating [name] Referring Physician: [name] Physician/Extender: Weeks in Treatment: 3 Constitutional . Pulse regular. Respirations normal and unlabored. Afebrile. . Eyes Nonicteric. Reactive to light. Ears, Nose, Mouth, and Throat Lips, teeth, and gums WNL.Marland Kitchen Moist mucosa without lesions. Neck supple and nontender. No palpable supraclavicular or cervical adenopathy. Normal sized without goiter. Respiratory WNL. No retractions.. Cardiovascular Pedal Pulses WNL. No clubbing, cyanosis or edema. Lymphatic No adneopathy. No adenopathy. No adenopathy. Musculoskeletal Adexa without tenderness or enlargement.. Digits and nails w/o clubbing, cyanosis, infection, petechiae, ischemia, or inflammatory conditions.. Integumentary (Hair, Skin) No suspicious lesions. No crepitus or fluctuance. No peri-wound warmth or erythema. No masses.Marland Kitchen Psychiatric Judgement  and insight Intact.. No evidence of depression, anxiety, or agitation.. Notes the more proximal of the 2 wounds has minimal granulation tissue still to epithelialize. The more distal of the 2 wounds is completely healed. Electronic Signature(s) Signed: 06/11/2015 1:12:27 PM By: [name] MD, FACS Previous Signature: 06/11/2015 1:11:57 PM Version By: [name] MD, FACS Entered By: [name] on 06/11/2015 13:12:26 [name] ([id number]) -------------------------------------------------------------------------------- Physician Orders Details Patient Name: [name] 06/11/2015 12:45 Date of Service: PM Medical  Record [id number] Number: Patient Account Number: [id number] 1928-09-11 (82 y.o. Treating RN: [name] Date of Birth/Sex: Female) Other Clinician: Primary Care Physician: [name] Treating [name] Referring Physician: [name] Physician/Extender: [name] in Treatment: 3 Verbal / Phone Orders: Yes ClinicianCarolyne Fiscal, Debi Read Back and Verified: Yes Diagnosis Coding Wound Cleansing Wound #1 Right,Proximal,Lateral Lower Leg o Clean wound with Normal Saline. o Cleanse wound with mild soap and water o May Shower, gently pat wound dry prior to applying new dressing. Anesthetic Wound #1 Right,Proximal,Lateral Lower Leg o Topical Lidocaine 4% cream applied to wound bed prior to debridement Skin Barriers/Peri-Wound Care Wound #1 Right,Proximal,Lateral Lower Leg o Skin Prep Primary Wound Dressing Wound #1 Right,Proximal,Lateral Lower Leg o Prisma Ag - moisten with saline Secondary Dressing Wound #1 Right,Proximal,Lateral Lower Leg o Boardered Foam Dressing Dressing Change Frequency Wound #1 Right,Proximal,Lateral Lower Leg o Change dressing every other day. Follow-up Appointments Wound #1 Right,Proximal,Lateral Lower Leg o Return Appointment in 1 week. Edema Control Wound #1 Right,Proximal,Lateral Lower  Leg o Elevate legs to the level of the heart and pump ankles as often as possible Doubrava, Nubia N. ([id number]) o Other: - get and wear compression stockings Additional Orders / Instructions Wound #1 Right,Proximal,Lateral Lower Leg o Increase protein intake. Electronic Signature(s) Signed: 06/11/2015 4:28:22 PM By: [name] MD, FACS Signed: 06/11/2015 6:42:13 PM By: Alric Quan Entered By: Alric Quan on 06/11/2015 13:05:42 [name] ([id number]) -------------------------------------------------------------------------------- Problem List Details Patient Name: RUPINDER, VICKNAIR 06/11/2015 12:45 Date of Service: PM Medical Record [id number] Number: Patient Account Number: [id number] 1928-07-20 (82 y.o. Treating RN: [name] Date of Birth/Sex: Female) Other Clinician: Primary Care Physician: [name] Treating [name] Referring Physician: [name] Physician/Extender: [name] in Treatment: 3 Active Problems ICD-10 Encounter Code Description Active Date Diagnosis I89.0 Lymphedema, not elsewhere classified 05/21/2015 Yes L97.212 Non-pressure chronic ulcer of right calf with fat layer 05/21/2015 Yes exposed Inactive Problems Resolved Problems Electronic Signature(s) Signed: 06/11/2015 1:10:49 PM By: [name] MD, FACS Entered By: [name] on 06/11/2015 13:10:49 [name] ([id number]) -------------------------------------------------------------------------------- Progress Note Details Patient Name: [name] 06/11/2015 12:45 Date of Service: PM Medical Record [id number] Number: Patient Account Number: [id number] [birth year] (82 y.o. Treating RN: [name] Date of Birth/Sex: Female) Other Clinician: Primary Care Physician: [name] Treating [name] Referring Physician: [name] Physician/Extender: [name] in Treatment: 3 Subjective Chief Complaint Information obtained from  Patient Patient presents to the wound care center for a consult due non healing wound which she's had for about 2 months History of Present Illness (HPI) The following HPI elements were documented for the patient's wound: Location: right lateral calf area Quality: Patient reports No Pain. Severity: Patient states wound are getting worse. Duration: Patient has had the wound for > 2 months prior to seeking treatment at the wound center Context: The wound occurred when the patient injured her leg and bruised it against a blunt object Modifying Factors: Consults to this date include:local care and no antibiotics Associated Signs and Symptoms: Patient reports having increase swelling. 82 year old patient who had a blunt injury to her right lower extremity about a few weeks ago now noticed that the wound had opened out and started draining fluid. She is known to have chronic lower extremity edema which has been there for several years. The PCP feels that the edema has not related to anemia or CHF. The patient's past medical history significant for adenocarcinoma of the colon status post resection, bowel obstruction with adhesions, essential hypertension,  vertigo,chronic back pain, status post cholecystectomy, hysterectomy, appendectomy, colon surgery, bowel adhesion resection, hemorrhoid surgery. She has never been a smoker. Objective Constitutional Pulse regular. Respirations normal and unlabored. Afebrile. Vitals Time Taken: 12:51 PM, Height: 61 in, Weight: 142 lbs, BMI: 26.8, Temperature: 98.2 F, Pulse: 67 bpm, Respiratory Rate: 20 breaths/min, Blood Pressure: 135/56 mmHg. DAMICA, CUNNING N. ([id number]) Eyes Nonicteric. Reactive to light. Ears, Nose, Mouth, and Throat Lips, teeth, and gums WNL.Marland Kitchen Moist mucosa without lesions. Neck supple and nontender. No palpable supraclavicular or cervical adenopathy. Normal sized without goiter. Respiratory WNL. No retractions.. Cardiovascular Pedal  Pulses WNL. No clubbing, cyanosis or edema. Lymphatic No adneopathy. No adenopathy. No adenopathy. Musculoskeletal Adexa without tenderness or enlargement.. Digits and nails w/o clubbing, cyanosis, infection, petechiae, ischemia, or inflammatory conditions.Marland Kitchen Psychiatric Judgement and insight Intact.. No evidence of depression, anxiety, or agitation.. General Notes: the more proximal of the 2 wounds has minimal granulation tissue still to epithelialize. The more distal of the 2 wounds is completely healed. Integumentary (Hair, Skin) No suspicious lesions. No crepitus or fluctuance. No peri-wound warmth or erythema. No masses.. Wound #1 status is Open. Original cause of wound was Trauma. The wound is located on the Right,Proximal,Lateral Lower Leg. The wound measures 1.7cm length x 0.5cm width x 0.1cm depth; 0.668cm^2 area and 0.067cm^3 volume. The wound is limited to skin breakdown. There is no tunneling or undermining noted. There is a large amount of serosanguineous drainage noted. The wound margin is flat and intact. There is large (67-100%) pink granulation within the wound bed. There is a small (1-33%) amount of necrotic tissue within the wound bed including Adherent Slough. The periwound skin appearance exhibited: Localized Edema, Moist. Periwound temperature was noted as No Abnormality. The periwound has tenderness on palpation. Wound #2 status is Healed - Epithelialized. Original cause of wound was Trauma. The wound is located on the Right,Distal,Lateral Lower Leg. The wound measures 0cm length x 0cm width x 0cm depth; 0cm^2 area and 0cm^3 volume. The wound is limited to skin breakdown. There is no tunneling or undermining noted. There is a none present amount of drainage noted. The wound margin is flat and intact. There is no granulation within the wound bed. There is no necrotic tissue within the wound bed. The periwound skin appearance exhibited: Localized Edema. The periwound skin  appearance did not exhibit: Moist. Periwound temperature was noted as No Abnormality. The periwound has tenderness on palpation. NACHOLE, HEBRON ([id number]) Assessment Active Problems ICD-10 I89.0 - Lymphedema, not elsewhere classified L97.212 - Non-pressure chronic ulcer of right calf with fat layer exposed Plan Wound Cleansing: Wound #1 Right,Proximal,Lateral Lower Leg: Clean wound with Normal Saline. Cleanse wound with mild soap and water May Shower, gently pat wound dry prior to applying new dressing. Anesthetic: Wound #1 Right,Proximal,Lateral Lower Leg: Topical Lidocaine 4% cream applied to wound bed prior to debridement Skin Barriers/Peri-Wound Care: Wound #1 Right,Proximal,Lateral Lower Leg: Skin Prep Primary Wound Dressing: Wound #1 Right,Proximal,Lateral Lower Leg: Prisma Ag - moisten with saline Secondary Dressing: Wound #1 Right,Proximal,Lateral Lower Leg: Boardered Foam Dressing Dressing Change Frequency: Wound #1 Right,Proximal,Lateral Lower Leg: Change dressing every other day. Follow-up Appointments: Wound #1 Right,Proximal,Lateral Lower Leg: Return Appointment in 1 week. Edema Control: Wound #1 Right,Proximal,Lateral Lower Leg: Elevate legs to the level of the heart and pump ankles as often as possible Other: - get and wear compression stockings Additional Orders / Instructions: Wound #1 Right,Proximal,Lateral Lower Leg: Increase protein intake. HOLLYANN, TORELLO N. ([id number]) I will continue silver collagen and her  compressions stockings and see her back next week. I anticipate discharge from the wound center soon. Electronic Signature(s) Signed: 06/11/2015 1:13:38 PM By: [name] MD, FACS Entered By: [name] on 06/11/2015 13:13:38 [name] ([id number]) -------------------------------------------------------------------------------- SuperBill Details Patient Name: [name] Date of Service: 06/11/2015 Medical Record Number:  [id number] Patient Account Number: [id number] Date of Birth/Sex: January 08, 1929 (82 y.o. Female) Treating RN: Carolyne Fiscal, Debi Primary Care Physician: [name] Other Clinician: Referring Physician: [name] Treating Physician/Extender: Frann Rider in Treatment: 3 Diagnosis Coding ICD-10 Codes Code Description I89.0 Lymphedema, not elsewhere classified L97.212 Non-pressure chronic ulcer of right calf with fat layer exposed Facility Procedures CPT4 Code: [id number] Description: [place] VISIT-LEV 3 EST PT Modifier: Quantity: 1 Physician Procedures CPT4 Code: [id number] Description: [id number] - WC PHYS LEVEL 3 - EST PT ICD-10 Description Diagnosis I89.0 Lymphedema, not elsewhere classified L97.212 Non-pressure chronic ulcer of right calf with fat Modifier: layer exposed Quantity: 1 Electronic Signature(s) Signed: 06/11/2015 4:28:22 PM By: [name] MD, FACS Signed: 06/11/2015 6:42:13 PM By: Alric Quan Previous Signature: 06/11/2015 1:14:02 PM Version By: [name] MD, FACS Entered By: Alric Quan on 06/11/2015 14:41:56

## 2015-06-13 DIAGNOSIS — E785 Hyperlipidemia, unspecified: Secondary | ICD-10-CM | POA: Diagnosis not present

## 2015-06-13 DIAGNOSIS — R10817 Generalized abdominal tenderness: Secondary | ICD-10-CM | POA: Diagnosis not present

## 2015-06-13 DIAGNOSIS — K529 Noninfective gastroenteritis and colitis, unspecified: Secondary | ICD-10-CM | POA: Diagnosis not present

## 2015-06-13 DIAGNOSIS — I10 Essential (primary) hypertension: Secondary | ICD-10-CM | POA: Diagnosis not present

## 2015-06-13 DIAGNOSIS — E876 Hypokalemia: Secondary | ICD-10-CM | POA: Diagnosis not present

## 2015-06-13 DIAGNOSIS — R112 Nausea with vomiting, unspecified: Secondary | ICD-10-CM | POA: Diagnosis not present

## 2015-06-13 DIAGNOSIS — K5289 Other specified noninfective gastroenteritis and colitis: Secondary | ICD-10-CM | POA: Diagnosis not present

## 2015-06-13 LAB — CBC
HEMATOCRIT: 35 % (ref 35.0–47.0)
Hemoglobin: 12.1 g/dL (ref 12.0–16.0)
MCH: 33.6 pg (ref 26.0–34.0)
MCHC: 34.5 g/dL (ref 32.0–36.0)
MCV: 97.3 fL (ref 80.0–100.0)
PLATELETS: 239 10*3/uL (ref 150–440)
RBC: 3.59 MIL/uL — ABNORMAL LOW (ref 3.80–5.20)
RDW: 13.8 % (ref 11.5–14.5)
WBC: 5.5 10*3/uL (ref 3.6–11.0)

## 2015-06-13 LAB — BASIC METABOLIC PANEL
Anion gap: 8 (ref 5–15)
BUN: 16 mg/dL (ref 6–20)
CALCIUM: 8.6 mg/dL — AB (ref 8.9–10.3)
CO2: 27 mmol/L (ref 22–32)
Chloride: 104 mmol/L (ref 101–111)
Creatinine, Ser: 0.98 mg/dL (ref 0.44–1.00)
GFR calc Af Amer: 60 mL/min — ABNORMAL LOW (ref >60)
GFR, EST NON AFRICAN AMERICAN: 52 mL/min — AB (ref >60)
GLUCOSE: 123 mg/dL — AB (ref 65–99)
POTASSIUM: 3.2 mmol/L — AB (ref 3.5–5.1)
SODIUM: 139 mmol/L (ref 135–145)

## 2015-06-13 MED ORDER — POTASSIUM CHLORIDE CRYS ER 20 MEQ PO TBCR
40.0000 meq | EXTENDED_RELEASE_TABLET | Freq: Once | ORAL | Status: AC
  Administered 2015-06-13: 40 meq via ORAL
  Filled 2015-06-13: qty 2

## 2015-06-13 NOTE — Progress Notes (Signed)
Patient ID: [name], female   DOB: [birth year], 82 y.o.   MRN: [id number] [facility] PROGRESS NOTE  [name] [id number] DOB: July 19, 1928 DOA: 06/11/2015 PCP: [name], MD  HPI/Subjective: Patient feeling much better than when she came in. Currently no abdominal pain. This morning had 2 out of 10 pain. Prior to admission her pain was up at 10 out of 10 intensity. No further nausea or vomiting. No bowel movement since admission.  Objective: Filed Vitals:   06/12/15 2042 06/13/15 0606  BP: 115/42 130/40  Pulse: 63 65  Temp: 98.3 F (36.8 C) 98.3 F (36.8 C)  Resp: 18 16    Filed Weights   06/11/15 2015 06/12/15 0219  Weight: 67.132 kg (148 lb) 65.635 kg (144 lb 11.2 oz)    ROS: Review of Systems  Constitutional: Negative for fever and chills.  Eyes: Negative for blurred vision.  Respiratory: Negative for cough and shortness of breath.   Cardiovascular: Negative for chest pain.  Gastrointestinal: Positive for abdominal pain. Negative for nausea, vomiting, diarrhea and constipation.  Genitourinary: Negative for dysuria.  Musculoskeletal: Negative for joint pain.  Neurological: Negative for dizziness and headaches.   Exam: Physical Exam  Constitutional: She is oriented to person, place, and time.  HENT:  Nose: No mucosal edema.  Mouth/Throat: No oropharyngeal exudate or posterior oropharyngeal edema.  Eyes: Conjunctivae, EOM and lids are normal. Pupils are equal, round, and reactive to light.  Neck: No JVD present. Carotid bruit is not present. No edema present. No thyroid mass and no thyromegaly present.  Cardiovascular: S1 normal and S2 normal.  Exam reveals no gallop.   No murmur heard. Pulses:      Dorsalis pedis pulses are 2+ on the right side, and 2+ on the left side.  Respiratory: No respiratory distress. She has no wheezes. She has no rhonchi. She has no rales.  GI: Soft. Bowel sounds are normal. There is tenderness in the periumbilical area.   Musculoskeletal:       Right ankle: She exhibits no swelling.       Left ankle: She exhibits no swelling.  Lymphadenopathy:    She has no cervical adenopathy.  Neurological: She is alert and oriented to person, place, and time. No cranial nerve deficit.  Skin: Skin is warm. No rash noted. Nails show no clubbing.  Some bruising on the lower extremities  Psychiatric: She has a normal mood and affect.      Data Reviewed: Basic Metabolic Panel:  Recent Labs Lab 06/11/15 2028 06/12/15 0358 06/13/15 0455  NA 138 138 139  K 3.4* 3.0* 3.2*  CL 99* 101 104  CO2 29 26 27   GLUCOSE 112* 120* 123*  BUN 29* 23* 16  CREATININE 1.20* 1.00 0.98  CALCIUM 10.2 9.2 8.6*  MG  --  2.1  --    Liver Function Tests:  Recent Labs Lab 06/11/15 2028  AST 23  ALT 17  ALKPHOS 50  BILITOT 1.3*  PROT 7.4  ALBUMIN 4.0    Recent Labs Lab 06/11/15 2028  LIPASE 20   CBC:  Recent Labs Lab 06/11/15 2028 06/12/15 0358 06/13/15 0455  WBC 11.1* 8.7 5.5  HGB 14.1 13.8 12.1  HCT 41.2 39.4 35.0  MCV 95.3 95.0 97.3  PLT 305 294 239   Cardiac Enzymes:  Recent Labs Lab 06/11/15 2028  TROPONINI <0.03     Studies: Ct Abdomen Pelvis W Contrast  06/11/2015  CLINICAL DATA:  Acute onset of nausea, vomiting and  abdominal tenderness. Initial encounter. EXAM: CT ABDOMEN AND PELVIS WITH CONTRAST TECHNIQUE: Multidetector CT imaging of the abdomen and pelvis was performed using the standard protocol following bolus administration of intravenous contrast. CONTRAST:  39mL ISOVUE-300 IOPAMIDOL (ISOVUE-300) INJECTION 61% COMPARISON:  MRI of the lumbar spine performed 01/20/2015, and CT of the abdomen and pelvis from 02/06/2008 FINDINGS: The visualized lung bases are clear. A moderate hiatal hernia is noted. The liver is unremarkable in appearance. Numerous calcified granulomata are noted within the spleen. The patient is status post cholecystectomy, with clips noted at the gallbladder fossa. The pancreas  and adrenal glands are unremarkable. A 4.9 cm cyst is noted at the upper pole of the left kidney, demonstrating minimal peripheral wall thickening. This has decreased in size from 2009. The kidneys are otherwise grossly unremarkable. There is no evidence of hydronephrosis. No renal or ureteral stones are identified. No perinephric stranding is seen. There is distention of small-bowel loops to 3.0 cm in maximal diameter, aside from more prominent chronic distention of a small bowel suture line at the right lower quadrant. There is gradual decompression, with mild wall thickening along the distal ileum, and surrounding trace fluid and soft tissue inflammation. There is mild fecalization at this region. Findings are concerning for acute infectious or inflammatory ileitis. No focal transition point is seen. The stomach is within normal limits. No acute vascular abnormalities are seen. Postoperative change is noted about the right lower quadrant, with an ileocolic anastomosis along the ascending colon. The colon is otherwise unremarkable. The bladder is moderately distended and grossly unremarkable. The patient is status post hysterectomy. No suspicious adnexal masses are seen. No inguinal lymphadenopathy is seen. No acute osseous abnormalities are identified. Left convex lumbar scoliosis is noted. Multilevel vacuum phenomenon is noted along the lower thoracic and lumbar spine. IMPRESSION: 1. Gradual decompression of small bowel, with mild wall thickening along the distal ileum, and surrounding trace fluid and soft tissue inflammation. Mild fecalization noted at this region. Findings concerning for acute infectious or inflammatory ileitis. No focal transition point seen to suggest bowel obstruction. 2. Moderate hiatal hernia noted. 3. Large left renal cyst has decreased in size from 2009. Minimal peripheral wall thickening at the cyst is also stable. 4. Left convex lumbar scoliosis noted. Mild degenerative change along  the lower thoracic and lumbar spine. Electronically Signed   By: Garald Balding M.D.   On: 06/11/2015 22:24   Dg Abd Acute W/chest  06/11/2015  CLINICAL DATA:  Patient with severe abdominal pain. Nausea and vomiting. EXAM: DG ABDOMEN ACUTE W/ 1V CHEST COMPARISON:  Chest radiograph 01/13/2015 FINDINGS: Patient is rotated to the left. Stable cardiac and mediastinal contours. No consolidative pulmonary opacities. No pleural effusion or pneumothorax. Relative paucity of bowel gas. Postsurgical changes within the right hemi abdomen. No free intraperitoneal air. Leftward curvature of the lumbar spine with associated degenerative changes. IMPRESSION: Paucity of small bowel gas. No acute cardiopulmonary process. Electronically Signed   By: Lovey Newcomer M.D.   On: 06/11/2015 21:14    Scheduled Meds: . ciprofloxacin  400 mg Intravenous Q12H  . collagenase   Topical Daily  . heparin  5,000 Units Subcutaneous Q8H  . lisinopril  10 mg Oral Daily   And  . hydrochlorothiazide  12.5 mg Oral Daily  . lidocaine  3 patch Transdermal Q24H  . loratadine  10 mg Oral Daily  . metoCLOPramide  5 mg Oral TID AC  . metoprolol succinate  25 mg Oral Daily  . metronidazole  500 mg Intravenous Q8H  . pantoprazole (PROTONIX) IV  40 mg Intravenous Q12H  . simvastatin  10 mg Oral QHS    Assessment/Plan:  1. Acute ileitis. Continue empiric antibiotics. Patient feels better than presentation. Advance diet and see how she does. When necessary pain medications and nausea medications.  2.   Hypokalemia probably from being on liquid diet. Discontinue                                    hydrochlorothiazide and replace potassium orally.  3.   Accelerated hypertension on presentation. Better control now.  4.   Gastroesophageal reflux disease without esophagitis continue Protonix 5.   Hyperlipidemia unspecified continue simvastatin.   Code Status:     Code Status Orders        Start     Ordered   06/12/15 0205  Do not  attempt resuscitation (DNR)   Continuous    Question Answer Comment  In the event of cardiac or respiratory ARREST Do not call a "code blue"   In the event of cardiac or respiratory ARREST Do not perform Intubation, CPR, defibrillation or ACLS   In the event of cardiac or respiratory ARREST Use medication by any route, position, wound care, and other measures to relive pain and suffering. May use oxygen, suction and manual treatment of airway obstruction as needed for comfort.      06/12/15 0205    Code Status History    Date Active Date Inactive Code Status Order ID Comments User Context   This patient has a current code status but no historical code status.     Family Communication: Husband at bedside  Disposition Plan: Potential home tomorrow at the earliest   Consultants:  Gastroenterology   Antibiotics:  Cipro   Flagyl   Time spent: 25 minutes   [place], [place]

## 2015-06-13 NOTE — Progress Notes (Signed)
GI Inpatient Follow-up Note  Patient Identification: [name] is a 82 y.o. female with abd pain, n/v, ileitis on CT  Subjective:  Abd pain much improved today.  + stool brown and formed. No f/c. No blood in stools or melena. Tolerating full diet.   Scheduled Inpatient Medications:  . ciprofloxacin  400 mg Intravenous Q12H  . collagenase   Topical Daily  . heparin  5,000 Units Subcutaneous Q8H  . lisinopril  10 mg Oral Daily   And  . hydrochlorothiazide  12.5 mg Oral Daily  . lidocaine  3 patch Transdermal Q24H  . loratadine  10 mg Oral Daily  . metoCLOPramide  5 mg Oral TID AC  . metoprolol succinate  25 mg Oral Daily  . metronidazole  500 mg Intravenous Q8H  . pantoprazole (PROTONIX) IV  40 mg Intravenous Q12H  . simvastatin  10 mg Oral QHS    Continuous Inpatient Infusions:     PRN Inpatient Medications:  hydrALAZINE, meclizine, morphine injection, ondansetron (ZOFRAN) IV, oxyCODONE, traMADol-acetaminophen  Review of Systems: Constitutional: Weight is stable.  Eyes: No changes in vision. ENT: No oral lesions, sore throat.  GI: see HPI.  Heme/Lymph: No easy bruising.  CV: No chest pain.  GU: No hematuria.  Integumentary: No rashes.  Neuro: No headaches.  Psych: No depression/anxiety.  Endocrine: No heat/cold intolerance.  Allergic/Immunologic: No urticaria.  Resp: No cough, SOB.  Musculoskeletal: No joint swelling.    Physical Examination: BP 137/53 mmHg  Pulse 78  Temp(Src) 97.9 F (36.6 C) (Oral)  Resp 20  Ht 5\' 1"  (1.549 m)  Wt 65.635 kg (144 lb 11.2 oz)  BMI 27.35 kg/m2  SpO2 100% Gen: NAD, alert and oriented x 4 HEENT: PEERLA, EOMI, Neck: supple, no JVD or thyromegaly Chest: CTA bilaterally, no wheezes, crackles, or other adventitious sounds CV: RRR, no m/g/c/r Abd: soft, + mild diffuse TTP,  ND, +BS in all four quadrants; no HSM, guarding, ridigity, or rebound tenderness Ext: no edema, well perfused with 2+ pulses, Skin: no rash or lesions  noted Lymph: no LAD  Data: Lab Results  Component Value Date   WBC 5.5 06/13/2015   HGB 12.1 06/13/2015   HCT 35.0 06/13/2015   MCV 97.3 06/13/2015   PLT 239 06/13/2015    Recent Labs Lab 06/11/15 2028 06/12/15 0358 06/13/15 0455  HGB 14.1 13.8 12.1   Lab Results  Component Value Date   NA 139 06/13/2015   K 3.2* 06/13/2015   CL 104 06/13/2015   CO2 27 06/13/2015   BUN 16 06/13/2015   CREATININE 0.98 06/13/2015   Lab Results  Component Value Date   ALT 17 06/11/2015   AST 23 06/11/2015   ALKPHOS 50 06/11/2015   BILITOT 1.3* 06/11/2015    Recent Labs Lab 06/11/15 2028  APTT 28  INR 0.91   Assessment/Plan: Ms. Godoy is a 82 y.o. F with resolving abd pain, n/v, iletitis on CT. Likely infectious.   Recommendations: - complete 5 day course of cipro/flagyl - likely d/c tomorrow  Please call with questions or concerns.  REIN, Grace Blight, MD

## 2015-06-14 DIAGNOSIS — K529 Noninfective gastroenteritis and colitis, unspecified: Secondary | ICD-10-CM | POA: Diagnosis not present

## 2015-06-14 DIAGNOSIS — I10 Essential (primary) hypertension: Secondary | ICD-10-CM | POA: Diagnosis not present

## 2015-06-14 DIAGNOSIS — E876 Hypokalemia: Secondary | ICD-10-CM | POA: Diagnosis not present

## 2015-06-14 DIAGNOSIS — E785 Hyperlipidemia, unspecified: Secondary | ICD-10-CM | POA: Diagnosis not present

## 2015-06-14 MED ORDER — METRONIDAZOLE 500 MG PO TABS
250.0000 mg | ORAL_TABLET | Freq: Three times a day (TID) | ORAL | Status: DC
  Filled 2015-06-14: qty 1

## 2015-06-14 MED ORDER — COLLAGENASE 250 UNIT/GM EX OINT: TOPICAL_OINTMENT | CUTANEOUS | Status: DC

## 2015-06-14 MED ORDER — LISINOPRIL 10 MG PO TABS: 10.0000 mg | ORAL_TABLET | Freq: Every day | ORAL | Status: DC

## 2015-06-14 MED ORDER — CIPROFLOXACIN HCL 500 MG PO TABS: 500.0000 mg | ORAL_TABLET | Freq: Two times a day (BID) | ORAL | Status: DC

## 2015-06-14 MED ORDER — METRONIDAZOLE 250 MG PO TABS: 250.0000 mg | ORAL_TABLET | Freq: Three times a day (TID) | ORAL | Status: DC

## 2015-06-14 NOTE — Discharge Summary (Signed)
[facility] [place] NAME: [name]    MR#:  [id number]  DATE OF BIRTH:  May 15, 1928  DATE OF ADMISSION:  06/11/2015 ADMITTING PHYSICIAN: Fritzi Mandes, MD  DATE OF DISCHARGE: 06/14/2015 11:00 AM  PRIMARY CARE PHYSICIAN: [name], MD    ADMISSION DIAGNOSIS:  Generalized abdominal pain [R10.84]  DISCHARGE DIAGNOSIS:  Principal Problem:   Acute Ileitis Active Problems:   Hypokalemia   Accelerated hypertension   SECONDARY DIAGNOSIS:   Past Medical History  Diagnosis Date  . Bowel obstruction (HCC)     s/p adhesion resection  . Recurrent sinus infections   . Vertigo   . Barrett's esophagus   . Hiatal hernia   . Hypercholesteremia   . Hypertension   . Chronic back pain   . Peripheral neuropathy ([place])   . Colon cancer ([place]) 1988 and 1989    adenocarcinoma, s/p resection x  2 and chemo  . S/P chemotherapy, time since greater than 12 weeks     colon cancer  . Diverticulitis   . Lumbar scoliosis   . Renal cyst   . H/O open leg wound     HOSPITAL COURSE:   1. Acute ileitis. Improved with no abdominal pain. Tolerated advanced diet. Continue empiric antibiotics of Cipro and Flagyl. Patient seen in consultation by gastroenterology. Patient clinically improved from presentation. 2. Hypokalemia. Worsened from being on liquid diet initially. Discontinue her thiazide. 3. Accelerated hypertension on presentation with better control now. Likely secondary to pain. 4. Gastroesophageal reflux disease without esophagitis on Protonix 5. Hyperlipidemia unspecified on simvastatin  DISCHARGE CONDITIONS:   Satisfactory  CONSULTS OBTAINED:  Treatment Team:  Josefine Class, MD  DRUG ALLERGIES:  No Known Allergies  DISCHARGE MEDICATIONS:   Discharge Medication List as of 06/14/2015  9:56 AM    START taking these medications   Details  ciprofloxacin (CIPRO) 500 MG tablet Take 1 tablet (500 mg total) by mouth 2 (two) times  daily., Starting 06/14/2015, Until Discontinued, Print    collagenase (SANTYL) ointment Apply to right lower extremity daily, Print    lisinopril (PRINIVIL,ZESTRIL) 10 MG tablet Take 1 tablet (10 mg total) by mouth daily., Starting 06/14/2015, Until Discontinued, Print    metroNIDAZOLE (FLAGYL) 250 MG tablet Take 1 tablet (250 mg total) by mouth 3 (three) times daily., Starting 06/14/2015, Until Discontinued, Print      CONTINUE these medications which have NOT CHANGED   Details  Calcium Carbonate-Vitamin D (CALCIUM 600 + D PO) Take 600 Units by mouth 2 (two) times daily with a meal. , Until Discontinued, Historical Med    estradiol (ESTRACE) 2 MG tablet TAKE 1 TABLET EVERY DAY, Normal    fexofenadine (ALLEGRA) 60 MG tablet TAKE 1 TABLET EVERY DAY, Normal    fish oil-omega-3 fatty acids 1000 MG capsule Take 2 g by mouth daily., Until Discontinued, Historical Med    lactase (LACTAID) 3000 UNITS tablet Take 1 tablet by mouth as needed., Until Discontinued, Historical Med    meclizine (ANTIVERT) 25 MG tablet Take 25 mg by mouth 3 (three) times daily as needed., Until Discontinued, Historical Med    meloxicam (MOBIC) 7.5 MG tablet Take 7.5 mg by mouth daily., Until Discontinued, Historical Med    metoCLOPramide (REGLAN) 5 MG tablet Take 5 mg by mouth. Take one tablet three times a day, Until Discontinued, Historical Med    metoprolol succinate (TOPROL-XL) 25 MG 24 hr tablet Take 1 tablet (25 mg total) by mouth daily., Starting  12/15/2014, Until Discontinued, Normal    oxyCODONE (OXY IR/ROXICODONE) 5 MG immediate release tablet Take 5 mg by mouth 3 (three) times daily. , Until Discontinued, Historical Med    pantoprazole (PROTONIX) 40 MG tablet Take 40 mg by mouth 2 (two) times daily., Until Discontinued, Historical Med    simvastatin (ZOCOR) 10 MG tablet TAKE 1 TABLET AT BEDTIME, Normal    traMADol-acetaminophen (ULTRACET) 37.5-325 MG per tablet Take 1 tablet by mouth 2 (two) times daily  as needed. , Until Discontinued, Historical Med    lidocaine (LIDODERM) 5 % Place 3 patches onto the skin daily. Reported on 06/13/2015, Until Discontinued, Historical Med      STOP taking these medications     lisinopril-hydrochlorothiazide (PRINZIDE,ZESTORETIC) 10-12.5 MG tablet      losartan-hydrochlorothiazide (HYZAAR) 50-12.5 MG tablet          DISCHARGE INSTRUCTIONS:   Follow-up with PMD in 1 week Can follow-up with gastroenterology if repeat symptoms.  If you experience worsening of your admission symptoms, develop shortness of breath, life threatening emergency, suicidal or homicidal thoughts you must seek medical attention immediately by calling 911 or calling your MD immediately  if symptoms less severe.  You Must read complete instructions/literature along with all the possible adverse reactions/side effects for all the Medicines you take and that have been prescribed to you. Take any new Medicines after you have completely understood and accept all the possible adverse reactions/side effects.   Please note  You were cared for by a hospitalist during your hospital stay. If you have any questions about your discharge medications or the care you received while you were in the hospital after you are discharged, you can call the unit and asked to speak with the hospitalist on call if the hospitalist that took care of you is not available. Once you are discharged, your primary care physician will handle any further medical issues. Please note that NO REFILLS for any discharge medications will be authorized once you are discharged, as it is imperative that you return to your primary care physician (or establish a relationship with a primary care physician if you do not have one) for your aftercare needs so that they can reassess your need for medications and monitor your lab values.    Today   CHIEF COMPLAINT:   Chief Complaint  Patient presents with  . Abdominal Pain  .  Nausea  . Emesis    HISTORY OF PRESENT ILLNESS:  [name]  is a 82 y.o. female presented with nausea vomiting and abdominal pain. Found to have an ileitis on CT scan   VITAL SIGNS:  Blood pressure 151/54, pulse 77, temperature 98.2 F (36.8 C), temperature source Oral, resp. rate 14, height 5\' 1"  (1.549 m), weight 65.635 kg (144 lb 11.2 oz), SpO2 98 %.    PHYSICAL EXAMINATION:  GENERAL:  82 y.o.-year-old patient lying in the bed with no acute distress.  EYES: Pupils equal, round, reactive to light and accommodation. No scleral icterus. Extraocular muscles intact.  HEENT: Head atraumatic, normocephalic. Oropharynx and nasopharynx clear.  NECK:  Supple, no jugular venous distention. No thyroid enlargement, no tenderness.  LUNGS: Normal breath sounds bilaterally, no wheezing, rales,rhonchi or crepitation. No use of accessory muscles of respiration.  CARDIOVASCULAR: S1, S2 normal. No murmurs, rubs, or gallops.  ABDOMEN: Soft, non-tender, non-distended. Bowel sounds present. No organomegaly or mass.  EXTREMITIES: No pedal edema, cyanosis, or clubbing.  NEUROLOGIC: Cranial nerves II through XII are intact. Muscle strength 5/5 in  all extremities. Sensation intact. Gait not checked.  PSYCHIATRIC: The patient is alert and oriented x 3.  SKIN: No obvious rash, lesion, or ulcer.   DATA REVIEW:   CBC  Recent Labs Lab 06/13/15 0455  WBC 5.5  HGB 12.1  HCT 35.0  PLT 239    Chemistries   Recent Labs Lab 06/11/15 2028 06/12/15 0358 06/13/15 0455  NA 138 138 139  K 3.4* 3.0* 3.2*  CL 99* 101 104  CO2 29 26 27   GLUCOSE 112* 120* 123*  BUN 29* 23* 16  CREATININE 1.20* 1.00 0.98  CALCIUM 10.2 9.2 8.6*  MG  --  2.1  --   AST 23  --   --   ALT 17  --   --   ALKPHOS 50  --   --   BILITOT 1.3*  --   --     Cardiac Enzymes  Recent Labs Lab 06/11/15 2028  TROPONINI <0.03    Management plans discussed with the patient, And she is in agreement. Spoke with husband about  plan yesterday.  CODE STATUS:     Code Status Orders        Start     Ordered   06/12/15 0205  Do not attempt resuscitation (DNR)   Continuous    Question Answer Comment  In the event of cardiac or respiratory ARREST Do not call a "code blue"   In the event of cardiac or respiratory ARREST Do not perform Intubation, CPR, defibrillation or ACLS   In the event of cardiac or respiratory ARREST Use medication by any route, position, wound care, and other measures to relive pain and suffering. May use oxygen, suction and manual treatment of airway obstruction as needed for comfort.      06/12/15 0205    Code Status History    Date Active Date Inactive Code Status Order ID Comments User Context   This patient has a current code status but no historical code status.      TOTAL TIME TAKING CARE OF THIS PATIENT: 35 minutes.    Loletha Grayer M.D on 06/14/2015 at 2:05 PM  Between 7am to 6pm - Pager - [phone]  After 6pm go to [email or web address] - password [email or web address] [facility]  [phone]  CC: Primary care physician; [name], MD

## 2015-06-14 NOTE — Care Management Note (Signed)
Case Management Note  Patient Details  Name: [name] MRN: [id number] Date of Birth: 02-Aug-1928  Subjective/Objective:        Discharge to home today. No home health services needs. Has a RW at home.             Action/Plan:   Expected Discharge Date:                  Expected Discharge Plan:     In-House Referral:     Discharge planning Services     Post Acute Care Choice:    Choice offered to:     DME Arranged:    DME Agency:     HH Arranged:    [place] Agency:     Status of Service:     Medicare Important Message Given:    Date Medicare IM Given:    Medicare IM give by:    Date Additional Medicare IM Given:    Additional Medicare Important Message give by:     If discussed at [place] of Stay Meetings, dates discussed:    Additional Comments:  Rosario Duey A, RN 06/14/2015, 8:49 AM

## 2015-06-14 NOTE — Progress Notes (Signed)
06/14/2015 11:08 AM  BP 151/54 mmHg  Pulse 77  Temp(Src) 98.2 F (36.8 C) (Oral)  Resp 14  Ht 5\' 1"  (1.549 m)  Wt 65.635 kg (144 lb 11.2 oz)  BMI 27.35 kg/m2  SpO2 98% Patient discharged per MD orders. Discharge instructions reviewed with patient and patient verbalized understanding. IV removed per policy. Prescriptions discussed and given to patient. Discharged via wheelchair escorted by nursing staff.  Almedia Balls, RN

## 2015-06-15 ENCOUNTER — Telehealth: Payer: Self-pay

## 2015-06-15 NOTE — Telephone Encounter (Signed)
Transition Care Management Follow-up Telephone Call   Date discharged? 06/14/15   How have you been since you were released from the hospital? I am weak, but doing better.  No pain.  Stomach is a little tender from the injections.  No nausea. No vomiting.  Eating small amounts at a time.  Drinking plenty of fluids.   Do you understand why you were in the hospital? YES, I was having stomach pain, nausea and vomiting.   Do you understand the discharge instructions? YES, rest, increase activity slowly and drinking of plenty of fluids.     Where were you discharged to? HOME.   Items Reviewed:  Medications reviewed: YES, started taking CIPRO 500mg , SANTYL ointment, Linsinopril 10mg , FLAGYL 250mg  without issues.  Stopped Lisinopril-HCTZ 10-12.5, Losartan HCTZ 50-12.5 without issues.  Continuing all scheduled home medications as directed.  Allergies reviewed: YES, no known allergies.  Dietary changes reviewed: YES, regular diet, as tolerated.  Referrals reviewed: YES, appointment scheduled with PCP and gastroenterology if repeat symptoms.   Functional Questionnaire:   Activities of Daily Living (ADLs):   She states they are independent in the following: Bathing, dressing, self feeding, grooming, toileting. States they require assistance with the following: Ambulating (uses walker), meal prep. Husband to assist.    Any transportation issues/concerns?: YES, no problems, husband to drive.   Any patient concerns? YES, no concerns at this time.   Confirmed importance and date/time of follow-up visits scheduled YES, appointment scheduled 06/23/15 at 3:00.  Provider Appointment booked with Dr. Nicki Reaper (PCP).  Confirmed with patient if condition begins to worsen call PCP or go to the ER.  Patient was given the office number and encouraged to call back with question or concerns.  :  YES, verbalized understanding.

## 2015-06-18 ENCOUNTER — Encounter: Payer: Commercial Managed Care - HMO | Attending: Surgery | Admitting: Surgery

## 2015-06-18 DIAGNOSIS — M549 Dorsalgia, unspecified: Secondary | ICD-10-CM | POA: Diagnosis not present

## 2015-06-18 DIAGNOSIS — I890 Lymphedema, not elsewhere classified: Secondary | ICD-10-CM | POA: Diagnosis not present

## 2015-06-18 DIAGNOSIS — L97212 Non-pressure chronic ulcer of right calf with fat layer exposed: Secondary | ICD-10-CM | POA: Insufficient documentation

## 2015-06-18 DIAGNOSIS — K219 Gastro-esophageal reflux disease without esophagitis: Secondary | ICD-10-CM | POA: Diagnosis not present

## 2015-06-18 DIAGNOSIS — M1990 Unspecified osteoarthritis, unspecified site: Secondary | ICD-10-CM | POA: Diagnosis not present

## 2015-06-18 DIAGNOSIS — G8929 Other chronic pain: Secondary | ICD-10-CM | POA: Insufficient documentation

## 2015-06-18 DIAGNOSIS — D649 Anemia, unspecified: Secondary | ICD-10-CM | POA: Diagnosis not present

## 2015-06-18 DIAGNOSIS — Z85038 Personal history of other malignant neoplasm of large intestine: Secondary | ICD-10-CM | POA: Diagnosis not present

## 2015-06-18 DIAGNOSIS — S81801A Unspecified open wound, right lower leg, initial encounter: Secondary | ICD-10-CM | POA: Diagnosis not present

## 2015-06-18 DIAGNOSIS — I10 Essential (primary) hypertension: Secondary | ICD-10-CM | POA: Diagnosis not present

## 2015-06-18 NOTE — Progress Notes (Signed)
ANE, METHOD ([id number]) Visit Report for 06/18/2015 Chief Complaint Document Details Patient Name: [name], [name] 06/18/2015 10:45 Date of Service: AM Medical Record [id number] Number: Patient Account Number: [id number] 1928-11-05 (82 y.o. Treating [name]: [name] Date of Birth/Sex: Female) Other Clinician: Primary Care Physician: [name] Treating [name] Referring Physician: Einar [name] Physician/Extender: [name] in Treatment: 4 Information Obtained from: Patient Chief Complaint Patient presents to the wound care center for a consult due non healing wound which she's had for about 2 months Electronic Signature(s) Signed: 06/18/2015 11:33:15 AM By: Katrina Fudge MD, FACS Entered By: [name] on 06/18/2015 11:33:15 [name] ([id number]) -------------------------------------------------------------------------------- HPI Details Patient Name: [name] 06/18/2015 10:45 Date of Service: AM Medical Record [id number] Number: Patient Account Number: [id number] [birth year] (82 y.o. Treating [name]: [name] Date of Birth/Sex: Female) Other Clinician: Primary Care Physician: [name] Treating [name] Referring Physician: Einar [name] Physician/Extender: [name] in Treatment: 4 History of Present Illness Location: right lateral calf area Quality: Patient reports No Pain. Severity: Patient states wound are getting worse. Duration: Patient has had the wound for > 2 months prior to seeking treatment at the wound center Context: The wound occurred when the patient injured her leg and bruised it against a blunt object Modifying Factors: Consults to this date include:local care and no antibiotics Associated Signs and Symptoms: Patient reports having increase swelling. HPI Description: 82 year old patient who had a blunt injury to her right lower extremity about a few [name] ago now noticed that the wound had opened out and started  draining fluid. She is known to have chronic lower extremity edema which has been there for several years. The PCP feels that the edema has not related to anemia or CHF. The patient's past medical history significant for adenocarcinoma of the colon status post resection, bowel obstruction with adhesions, essential hypertension, vertigo,chronic back pain, status post cholecystectomy, hysterectomy, appendectomy, colon surgery, bowel adhesion resection, hemorrhoid surgery. She has never been a smoker. 06/18/2015 -- the patient was admitted to hospital between 06/11/2015 and 06/14/2015 with an acute ileitis, hypokalemia and accelerated hypertension. was put on empiric Cipro and Flagyl. She is doing fine at the present time. Electronic Signature(s) Signed: 06/18/2015 11:35:02 AM By: Katrina Fudge MD, FACS Entered By: [name] on 06/18/2015 11:35:02 [name] ([id number]) -------------------------------------------------------------------------------- Physical Exam Details Patient Name: [name], [name] 06/18/2015 10:45 Date of Service: AM Medical Record [id number] Number: Patient Account Number: [id number] 14-Aug-1928 (82 y.o. Treating [name]: [name] Date of Birth/Sex: Female) Other Clinician: Primary Care Physician: [name] Treating [name] Referring Physician: Einar [name] Physician/Extender: [name] in Treatment: 4 Constitutional . Pulse regular. Respirations normal and unlabored. Afebrile. . Eyes Nonicteric. Reactive to light. Ears, Nose, Mouth, and Throat Lips, teeth, and gums WNL.Marland Kitchen Moist mucosa without lesions. Neck supple and nontender. No palpable supraclavicular or cervical adenopathy. Normal sized without goiter. Respiratory WNL. No retractions.. Cardiovascular Pedal Pulses WNL. No clubbing, cyanosis or edema. Chest Breasts symmetical and no nipple discharge.. Breast tissue WNL, no masses, lumps, or tenderness.. Lymphatic No adneopathy. No  adenopathy. No adenopathy. Musculoskeletal Adexa without tenderness or enlargement.. Digits and nails w/o clubbing, cyanosis, infection, petechiae, ischemia, or inflammatory conditions.. Integumentary (Hair, Skin) No suspicious lesions. No crepitus or fluctuance. No peri-wound warmth or erythema. No masses.Marland Kitchen Psychiatric Judgement and insight Intact.. No evidence of depression, anxiety, or agitation.. Notes the wound is looking very good with some small area which is still not completely up to lyse but has healthy granulation tissue.  She has a significant ecchymosis and bruise in the region just below her right knee which is something new but there is no open wound. Electronic Signature(s) Signed: 06/18/2015 11:36:22 AM By: Katrina Fudge MD, FACS Entered By: [name] on 06/18/2015 11:36:21 [name] ([id number]) -------------------------------------------------------------------------------- Physician Orders Details Patient Name: [name] Date of Service: 06/18/2015 10:45 AM Medical Record Patient Account Number: [id number] Number: [name], [name], [name], Treating [name]: 01/14/1929 (82 y.o. [name] Date of Birth/Sex: Female) Other Clinician: Primary Care Physician: [name] Treating [name] Referring Physician: Einar [name] Physician/Extender: [name] in Treatment: 4 Verbal / Phone Orders: Yes Clinician: [name], [name], [name], [name] Read Back and Verified: Yes Diagnosis Coding Wound Cleansing Wound #1 Right,Proximal,Lateral Lower Leg o Clean wound with Normal Saline. o Cleanse wound with mild soap and water o May Shower, gently pat wound dry prior to applying new dressing. Anesthetic Wound #1 Right,Proximal,Lateral Lower Leg o Topical Lidocaine 4% cream applied to wound bed prior to debridement Skin Barriers/Peri-Wound Care Wound #1 Right,Proximal,Lateral Lower Leg o Skin Prep Primary Wound Dressing Wound #1 Right,Proximal,Lateral Lower Leg o  Prisma Ag - moisten with saline Secondary Dressing Wound #1 Right,Proximal,Lateral Lower Leg o Boardered Foam Dressing Dressing Change Frequency Wound #1 Right,Proximal,Lateral Lower Leg o Change dressing every other day. Follow-up Appointments Wound #1 Right,Proximal,Lateral Lower Leg o Return Appointment in 1 week. Edema Control Wound #1 Right,Proximal,Lateral Lower Leg o Elevate legs to the level of the heart and pump ankles as often as possible [name], [name] ([id number]) o Other: - get and wear compression stockings Additional Orders / Instructions Wound #1 Right,Proximal,Lateral Lower Leg o Increase protein intake. Electronic Signature(s) Signed: 06/18/2015 11:30:00 AM By: Regan Lemming BSN, [name] Signed: 06/18/2015 4:29:13 PM By: Katrina Fudge MD, FACS Entered By: Regan Lemming on 06/18/2015 11:30:00 [name] ([id number]) -------------------------------------------------------------------------------- Problem List Details Patient Name: [name], [name] 06/18/2015 10:45 Date of Service: AM Medical Record [id number] Number: Patient Account Number: [id number] [birth year] (82 y.o. Treating [name]: [name] Date of Birth/Sex: Female) Other Clinician: Primary Care Physician: [name] Treating [name] Referring Physician: Einar [name] Physician/Extender: [name] in Treatment: 4 Active Problems ICD-10 Encounter Code Description Active Date Diagnosis I89.0 Lymphedema, not elsewhere classified 05/21/2015 Yes L97.212 Non-pressure chronic ulcer of right calf with fat layer 05/21/2015 Yes exposed Inactive Problems Resolved Problems Electronic Signature(s) Signed: 06/18/2015 11:33:08 AM By: Katrina Fudge MD, FACS Entered By: [name] on 06/18/2015 11:33:08 [name] ([id number]) -------------------------------------------------------------------------------- Progress Note Details Patient Name: [name] 06/18/2015 10:45 Date of  Service: AM Medical Record [id number] Number: Patient Account Number: [id number] 05-22-1928 (82 y.o. Treating [name]: [name] Date of Birth/Sex: Female) Other Clinician: Primary Care Physician: [name] Treating [name] Referring Physician: Einar [name] Physician/Extender: [name] in Treatment: 4 Subjective Chief Complaint Information obtained from Patient Patient presents to the wound care center for a consult due non healing wound which she's had for about 2 months History of Present Illness (HPI) The following HPI elements were documented for the patient's wound: Location: right lateral calf area Quality: Patient reports No Pain. Severity: Patient states wound are getting worse. Duration: Patient has had the wound for > 2 months prior to seeking treatment at the wound center Context: The wound occurred when the patient injured her leg and bruised it against a blunt object Modifying Factors: Consults to this date include:local care and no antibiotics Associated Signs and Symptoms: Patient reports having increase swelling. 82 year old patient who had a blunt injury to her  right lower extremity about a few [name] ago now noticed that the wound had opened out and started draining fluid. She is known to have chronic lower extremity edema which has been there for several years. The PCP feels that the edema has not related to anemia or CHF. The patient's past medical history significant for adenocarcinoma of the colon status post resection, bowel obstruction with adhesions, essential hypertension, vertigo,chronic back pain, status post cholecystectomy, hysterectomy, appendectomy, colon surgery, bowel adhesion resection, hemorrhoid surgery. She has never been a smoker. 06/18/2015 -- the patient was admitted to hospital between 06/11/2015 and 06/14/2015 with an acute ileitis, hypokalemia and accelerated hypertension. was put on empiric Cipro and Flagyl. She is doing fine  at the present time. Medications lisinopril 10 mg-hydrochlorothiazide 12.5 mg tablet oral 1 1 tablet oral as directed meclizine 25 mg tablet oral 1 1 tablet oral every eight hours as needed fexofenadine 60 mg tablet oral 1 1 tablet oral daily [name], [name] ([id number]) calcium carbonate 600 mg (1,500 mg)-vitamin D3 400 unit tablet oral 1 1 tablet oral two times daily estradiol 2 mg tablet oral 1 1 tablet oral daily Lactaid Fast Act 9,000 unit tablet oral 1 1 tablet oral daily metoclopramide 5 mg tablet oral 1 1 tablet oral beforel each meal meloxicam 7.5 mg tablet oral 1 1 tablet oral daily Lidoderm 5 % topical patch topical 1 1 adhesive patch,medicated topical apply three patches daily for up to 12 hours cholecalciferol (vitamin D3) 2,000 unit capsule oral 1 1 capsule oral daily Objective Constitutional Pulse regular. Respirations normal and unlabored. Afebrile. Vitals Time Taken: 10:49 AM, Height: 61 in, Weight: 142 lbs, BMI: 26.8, Temperature: 98.1 F, Pulse: 69 bpm, Respiratory Rate: 20 breaths/min, Blood Pressure: 139/44 mmHg. Eyes Nonicteric. Reactive to light. Ears, Nose, Mouth, and Throat Lips, teeth, and gums WNL.Marland Kitchen Moist mucosa without lesions. Neck supple and nontender. No palpable supraclavicular or cervical adenopathy. Normal sized without goiter. Respiratory WNL. No retractions.. Cardiovascular Pedal Pulses WNL. No clubbing, cyanosis or edema. Chest Breasts symmetical and no nipple discharge.. Breast tissue WNL, no masses, lumps, or tenderness.. Lymphatic No adneopathy. No adenopathy. No adenopathy. Musculoskeletal Adexa without tenderness or enlargement.. Digits and nails w/o clubbing, cyanosis, infection, petechiae, ischemia, or inflammatory conditions.Marland Kitchen Psychiatric Judgement and insight Intact.. No evidence of depression, anxiety, or agitation.Marland Kitchen [name], [name] ([id number]) General Notes: the wound is looking very good with some small area which is still not  completely up to lyse but has healthy granulation tissue. She has a significant ecchymosis and bruise in the region just below her right knee which is something new but there is no open wound. Integumentary (Hair, Skin) No suspicious lesions. No crepitus or fluctuance. No peri-wound warmth or erythema. No masses.. Wound #1 status is Open. Original cause of wound was Trauma. The wound is located on the Right,Proximal,Lateral Lower Leg. The wound measures 0.9cm length x 0.3cm width x 0.1cm depth; 0.212cm^2 area and 0.021cm^3 volume. The wound is limited to skin breakdown. There is no tunneling or undermining noted. There is a large amount of serosanguineous drainage noted. The wound margin is flat and intact. There is large (67-100%) pink granulation within the wound bed. There is a small (1-33%) amount of necrotic tissue within the wound bed including Adherent Slough. The periwound skin appearance exhibited: Localized Edema, Moist. Periwound temperature was noted as No Abnormality. The periwound has tenderness on palpation. Assessment Active Problems ICD-10 I89.0 - Lymphedema, not elsewhere classified L97.212 - Non-pressure chronic ulcer of right calf with  fat layer exposed Plan Wound Cleansing: Wound #1 Right,Proximal,Lateral Lower Leg: Clean wound with Normal Saline. Cleanse wound with mild soap and water May Shower, gently pat wound dry prior to applying new dressing. Anesthetic: Wound #1 Right,Proximal,Lateral Lower Leg: Topical Lidocaine 4% cream applied to wound bed prior to debridement Skin Barriers/Peri-Wound Care: Wound #1 Right,Proximal,Lateral Lower Leg: Skin Prep Primary Wound Dressing: Wound #1 Right,Proximal,Lateral Lower Leg: Prisma Ag - moisten with saline Secondary Dressing: [name], [name] ([id number]) Wound #1 Right,Proximal,Lateral Lower Leg: Boardered Foam Dressing Dressing Change Frequency: Wound #1 Right,Proximal,Lateral Lower Leg: Change dressing every  other day. Follow-up Appointments: Wound #1 Right,Proximal,Lateral Lower Leg: Return Appointment in 1 week. Edema Control: Wound #1 Right,Proximal,Lateral Lower Leg: Elevate legs to the level of the heart and pump ankles as often as possible Other: - get and wear compression stockings Additional Orders / Instructions: Wound #1 Right,Proximal,Lateral Lower Leg: Increase protein intake. I have noted her recent hospitalization and the fact that she is on Cipro and Flagyl. for local care we will continue with Prisma AG and a bordered foam and she will continue to use her compression stockings. We'll see her back next week. Electronic Signature(s) Signed: 06/18/2015 11:37:50 AM By: Katrina Fudge MD, FACS Entered By: [name] on 06/18/2015 11:37:50 [name] ([id number]) -------------------------------------------------------------------------------- SuperBill Details Patient Name: [name] Date of Service: 06/18/2015 Medical Record Number: [id number] Patient Account Number: [id number] Date of Birth/Sex: 1928/05/27 (82 y.o. Female) Treating [name]: Carolyne Fiscal, Debi Primary Care Physician: [name] Other Clinician: Referring Physician: Einar [name] Treating Physician/Extender: Frann Rider in Treatment: 4 Diagnosis Coding ICD-10 Codes Code Description I89.0 Lymphedema, not elsewhere classified L97.212 Non-pressure chronic ulcer of right calf with fat layer exposed Facility Procedures CPT4 Code: [id number] Description: [phone] - WOUND CARE VISIT-LEV 2 EST PT Modifier: Quantity: 1 Physician Procedures CPT4 Code: [id number] Description: [id number] - WC PHYS LEVEL 3 - EST PT ICD-10 Description Diagnosis I89.0 Lymphedema, not elsewhere classified L97.212 Non-pressure chronic ulcer of right calf with fat Modifier: layer exposed Quantity: 1 Electronic Signature(s) Signed: 06/18/2015 12:58:14 PM By: Regan Lemming BSN, [name] Signed: 06/18/2015 4:29:13 PM By: Katrina Fudge MD,  FACS Previous Signature: 06/18/2015 11:38:02 AM Version By: Katrina Fudge MD, FACS Entered By: Regan Lemming on 06/18/2015 12:58:14

## 2015-06-19 NOTE — Progress Notes (Signed)
Katrina, [name] ([id number]) Visit Report for 06/18/2015 Arrival Information Details Patient Name: [name], [name] Date of Service: 06/18/2015 10:45 AM Medical Record Number: [id number] Patient Account Number: [id number] Date of Birth/Sex: [birth year] (82 y.o. Female) Treating RN: Carolyne Fiscal, Debi Primary Care Physician: [name] Other Clinician: Referring Physician: [name] Treating Physician/Extender: Frann Rider in Treatment: 4 Visit Information History Since Last Visit All ordered tests and consults were completed: No Patient Arrived: Gilford Rile Added or deleted any medications: No Arrival Time: 10:48 Any new allergies or adverse reactions: No Accompanied By: husband Had a fall or experienced change in No Transfer Assistance: [employer] activities of daily living that may affect Patient Lift risk of falls: Patient Identification Verified: Yes Signs or symptoms of abuse/neglect since last No Secondary Verification Process Yes visito Completed: Hospitalized since last visit: No Patient Requires Transmission- No Pain Present Now: No Based Precautions: Patient Has Alerts: No Electronic Signature(s) Signed: 06/18/2015 5:52:45 PM By: Alric Quan Entered By: Alric Quan on 06/18/2015 10:49:13 [name] ([id number]) -------------------------------------------------------------------------------- Clinic Level of Care Assessment Details Patient Name: [name] Date of Service: 06/18/2015 10:45 AM Medical Record Number: [id number] Patient Account Number: [id number] Date of Birth/Sex: [birth year] (82 y.o. Female) Treating RN: [name], RN, BSN, Velva Harman Primary Care Physician: [name] Other Clinician: Referring Physician: [name] Treating Physician/Extender: Frann Rider in Treatment: 4 Clinic Level of Care Assessment Items TOOL 4 Quantity Score []  - Use when only an EandM is performed on FOLLOW-UP visit 0 ASSESSMENTS - Nursing  Assessment / Reassessment X - Reassessment of Co-morbidities (includes updates in patient status) 1 10 X - Reassessment of Adherence to Treatment Plan 1 5 ASSESSMENTS - Wound and Skin Assessment / Reassessment X - Simple Wound Assessment / Reassessment - one wound 1 5 []  - Complex Wound Assessment / Reassessment - multiple wounds 0 []  - Dermatologic / Skin Assessment (not related to wound area) 0 ASSESSMENTS - Focused Assessment []  - Circumferential Edema Measurements - multi extremities 0 []  - Nutritional Assessment / Counseling / Intervention 0 X - Lower Extremity Assessment (monofilament, tuning fork, pulses) 1 5 []  - Peripheral Arterial Disease Assessment (using hand held doppler) 0 ASSESSMENTS - Ostomy and/or Continence Assessment and Care []  - Incontinence Assessment and Management 0 []  - Ostomy Care Assessment and Management (repouching, etc.) 0 PROCESS - Coordination of Care X - Simple Patient / Family Education for ongoing care 1 15 []  - Complex (extensive) Patient / Family Education for ongoing care 0 []  - Staff obtains Programmer, systems, Records, Test Results / Process Orders 0 []  - Staff telephones HHA, Nursing Homes / Clarify orders / etc 0 []  - Routine Transfer to another Facility (non-emergent condition) 0 [name], [name] ([id number]) []  - Routine Hospital Admission (non-emergent condition) 0 []  - New Admissions / Biomedical engineer / Ordering NPWT, Apligraf, etc. 0 []  - Emergency Hospital Admission (emergent condition) 0 []  - Simple Discharge Coordination 0 []  - Complex (extensive) Discharge Coordination 0 PROCESS - Special Needs []  - Pediatric / Minor Patient Management 0 []  - Isolation Patient Management 0 []  - Hearing / Language / Visual special needs 0 []  - Assessment of Community assistance (transportation, D/C planning, etc.) 0 []  - Additional assistance / Altered mentation 0 []  - Support Surface(s) Assessment (bed, cushion, seat, etc.) 0 INTERVENTIONS - Wound  Cleansing / Measurement X - Simple Wound Cleansing - one wound 1 5 []  - Complex Wound Cleansing - multiple wounds 0 X - Wound Imaging (photographs - any number of  wounds) 1 5 []  - Wound Tracing (instead of photographs) 0 X - Simple Wound Measurement - one wound 1 5 []  - Complex Wound Measurement - multiple wounds 0 INTERVENTIONS - Wound Dressings X - Small Wound Dressing one or multiple wounds 1 10 []  - Medium Wound Dressing one or multiple wounds 0 []  - Large Wound Dressing one or multiple wounds 0 []  - Application of Medications - topical 0 []  - Application of Medications - injection 0 INTERVENTIONS - Miscellaneous []  - External ear exam 0 [name], [name] ([id number]) []  - Specimen Collection (cultures, biopsies, blood, body fluids, etc.) 0 []  - Specimen(s) / Culture(s) sent or taken to Lab for analysis 0 []  - Patient Transfer (multiple staff / Harrel Lemon Lift / Similar devices) 0 []  - Simple Staple / Suture removal (25 or less) 0 []  - Complex Staple / Suture removal (26 or more) 0 []  - Hypo / Hyperglycemic Management (close monitor of Blood Glucose) 0 []  - Ankle / Brachial Index (ABI) - do not check if billed separately 0 X - Vital Signs 1 5 Has the patient been seen at the hospital within the last three years: Yes Total Score: 70 Level Of Care: New/Established - Level 2 Electronic Signature(s) Signed: 06/18/2015 12:57:54 PM By: Regan Lemming BSN, RN Entered By: Regan Lemming on 06/18/2015 12:57:53 [name] ([id number]) -------------------------------------------------------------------------------- Encounter Discharge Information Details Patient Name: [name] Date of Service: 06/18/2015 10:45 AM Medical Record Number: [id number] Patient Account Number: [id number] Date of Birth/Sex: 04/06/1928 (82 y.o. Female) Treating RN: Carolyne Fiscal, Debi Primary Care Physician: [name] Other Clinician: Referring Physician: [name] Treating Physician/Extender: Frann Rider in Treatment: 4 Encounter Discharge Information Items Discharge Pain Level: 0 Discharge Condition: Stable Ambulatory Status: Walker Discharge Destination: Home Transportation: Private Auto Accompanied By: spouse Schedule Follow-up Appointment: No Medication Reconciliation completed No and provided to Patient/Care Oluwatobi Visser: Provided on Clinical Summary of Care: 06/18/2015 Form Type Recipient Paper Patient [facility] Electronic Signature(s) Signed: 06/18/2015 1:22:48 PM By: Regan Lemming BSN, RN Previous Signature: 06/18/2015 1:21:17 PM Version By: Regan Lemming BSN, RN Previous Signature: 06/18/2015 11:30:40 AM Version By: Ruthine Dose Entered By: Regan Lemming on 06/18/2015 13:22:48 [name] ([id number]) -------------------------------------------------------------------------------- Lower Extremity Assessment Details Patient Name: [name] Date of Service: 06/18/2015 10:45 AM Medical Record Number: [id number] Patient Account Number: [id number] Date of Birth/Sex: October 18, 1928 (82 y.o. Female) Treating RN: Carolyne Fiscal, Debi Primary Care Physician: [name] Other Clinician: Referring Physician: [name] Treating Physician/Extender: Frann Rider in Treatment: 4 Vascular Assessment Pulses: Posterior Tibial Dorsalis Pedis Palpable: [Right:Yes] Extremity colors, hair growth, and conditions: Extremity Color: [Right:Mottled] Temperature of Extremity: [Right:Warm] Capillary Refill: [Right:< 3 seconds] Toe Nail Assessment Left: Right: Thick: No Discolored: No Deformed: No Improper Length and Hygiene: No Electronic Signature(s) Signed: 06/18/2015 5:52:45 PM By: Alric Quan Entered By: Alric Quan on 06/18/2015 10:52:30 [name] ([id number]) -------------------------------------------------------------------------------- Multi Wound Chart Details Patient Name: [name] Date of Service: 06/18/2015 10:45 AM Medical Record Number:  [id number] Patient Account Number: [id number] Date of Birth/Sex: 02-02-1929 (82 y.o. Female) Treating RN: Baruch Gouty, RN, BSN, Velva Harman Primary Care Physician: [name] Other Clinician: Referring Physician: [name] Treating Physician/Extender: Frann Rider in Treatment: 4 Vital Signs Height(in): 61 Pulse(bpm): 69 Weight(lbs): 142 Blood Pressure 139/44 (mmHg): Body Mass Index(BMI): 27 Temperature(F): 98.1 Respiratory Rate 20 (breaths/min): Photos: [1:No Photos] [N/A:N/A] Wound Location: [1:Right Lower Leg - Lateral, Proximal] [N/A:N/A] Wounding Event: [1:Trauma] [N/A:N/A] Primary Etiology: [1:Trauma, Other] [N/A:N/A] Comorbid History: [1:Anemia, Hypertension,  Osteoarthritis, Received Chemotherapy] [N/A:N/A] Date Acquired: [1:04/20/2015] [N/A:N/A] Weeks of Treatment: [1:4] [N/A:N/A] Wound Status: [1:Open] [N/A:N/A] Measurements L x W x D 0.9x0.3x0.1 [N/A:N/A] (cm) Area (cm) : [1:0.212] [N/A:N/A] Volume (cm) : [1:0.021] [N/A:N/A] % Reduction in Area: [1:96.00%] [N/A:N/A] % Reduction in Volume: 96.00% [N/A:N/A] Classification: [1:Partial Thickness] [N/A:N/A] Exudate Amount: [1:Large] [N/A:N/A] Exudate Type: [1:Serosanguineous] [N/A:N/A] Exudate Color: [1:red, brown] [N/A:N/A] Wound Margin: [1:Flat and Intact] [N/A:N/A] Granulation Amount: [1:Large (67-100%)] [N/A:N/A] Granulation Quality: [1:Pink] [N/A:N/A] Necrotic Amount: [1:Small (1-33%)] [N/A:N/A] Exposed Structures: [1:Fascia: No Fat: No Tendon: No Muscle: No Joint: No] [N/A:N/A] Bone: No Limited to Skin Breakdown Epithelialization: None N/A N/A Periwound Skin Texture: Edema: Yes N/A N/A Periwound Skin Moist: Yes N/A N/A Moisture: Periwound Skin Color: No Abnormalities Noted N/A N/A Temperature: No Abnormality N/A N/A Tenderness on Yes N/A N/A Palpation: Wound Preparation: Ulcer Cleansing: N/A N/A Rinsed/Irrigated with Saline Topical Anesthetic Applied: Other: lidocaine 4% Treatment  Notes Electronic Signature(s) Signed: 06/18/2015 11:28:33 AM By: Regan Lemming BSN, RN Entered By: Regan Lemming on 06/18/2015 11:28:33 [name] ([id number]) -------------------------------------------------------------------------------- [place] Details Patient Name: [name] Date of Service: 06/18/2015 10:45 AM Medical Record Number: [id number] Patient Account Number: [id number] Date of Birth/Sex: 10/26/1928 (82 y.o. Female) Treating RN: [name], RN, BSN, Velva Harman Primary Care Physician: [name] Other Clinician: Referring Physician: [name] Treating Physician/Extender: Frann Rider in Treatment: 4 Active Inactive Abuse / Safety / Falls / Self Care Management Nursing Diagnoses: Potential for falls Goals: Patient will remain injury free Date Initiated: 05/21/2015 Goal Status: Active Interventions: Assess fall risk on admission and as needed Notes: Nutrition Nursing Diagnoses: Imbalanced nutrition Goals: Patient/caregiver agrees to and verbalizes understanding of need to use nutritional supplements and/or vitamins as prescribed Date Initiated: 05/21/2015 Goal Status: Active Interventions: Assess patient nutrition upon admission and as needed per policy Notes: Orientation to the Wound Care Program Nursing Diagnoses: Knowledge deficit related to the wound healing center program Goals: Patient/caregiver will verbalize understanding of the [name], [name] ([id number]) Date Initiated: 05/21/2015 Goal Status: Active Interventions: Provide education on orientation to the wound center Notes: Pain, Acute or Chronic Nursing Diagnoses: Pain, acute or chronic: actual or potential Potential alteration in comfort, pain Goals: Patient/caregiver will verbalize adequate pain control between visits Date Initiated: 05/21/2015 Goal Status: Active Patient/caregiver will verbalize comfort level met Date Initiated:  05/21/2015 Goal Status: Active Interventions: Complete pain assessment as per visit requirements Notes: Wound/Skin Impairment Nursing Diagnoses: Impaired tissue integrity Goals: Ulcer/skin breakdown will have a volume reduction of 30% by week 4 Date Initiated: 05/21/2015 Goal Status: Active Ulcer/skin breakdown will have a volume reduction of 50% by week 8 Date Initiated: 05/21/2015 Goal Status: Active Ulcer/skin breakdown will have a volume reduction of 80% by week 12 Date Initiated: 05/21/2015 Goal Status: Active Interventions: Assess patient/caregiver ability to obtain necessary supplies Assess ulceration(s) every visit [name], [name] ([id number]) Notes: Electronic Signature(s) Signed: 06/18/2015 11:28:06 AM By: Regan Lemming BSN, RN Entered By: Regan Lemming on 06/18/2015 11:28:05 [name] ([id number]) -------------------------------------------------------------------------------- Pain Assessment Details Patient Name: [name] Date of Service: 06/18/2015 10:45 AM Medical Record Number: [id number] Patient Account Number: [id number] Date of Birth/Sex: 12/05/1928 (82 y.o. Female) Treating RN: Carolyne Fiscal, Debi Primary Care Physician: [name] Other Clinician: Referring Physician: [name] Treating Physician/Extender: Frann Rider in Treatment: 4 Active Problems Location of Pain Severity and Description of Pain Patient Has Paino No Site Locations Pain Management and Medication Current Pain Management: Electronic Signature(s) Signed: 06/18/2015 5:52:45  PM By: Alric Quan Entered By: Alric Quan on 06/18/2015 10:49:20 [name] ([id number]) -------------------------------------------------------------------------------- Patient/Caregiver Education Details Patient Name: [name] Date of Service: 06/18/2015 10:45 AM Medical Record Number: [id number] Patient Account Number: [id number] Date of Birth/Gender: 09-01-1928 (82 y.o.  Female) Treating RN: Baruch Gouty, RN, BSN, Velva Harman Primary Care Physician: [name] Other Clinician: Referring Physician: [name] Treating Physician/Extender: Frann Rider in Treatment: 4 Education Assessment Education Provided To: Patient Education Topics Provided Welcome To [facility] [place]: Methods: Explain/Verbal Wound/Skin Impairment: Methods: Explain/Verbal Responses: State content correctly Electronic Signature(s) Signed: 06/18/2015 1:23:23 PM By: Regan Lemming BSN, RN Entered By: Regan Lemming on 06/18/2015 13:23:23 [name] ([id number]) -------------------------------------------------------------------------------- Wound Assessment Details Patient Name: [name] Date of Service: 06/18/2015 10:45 AM Medical Record Number: [id number] Patient Account Number: [id number] Date of Birth/Sex: Nov 03, 1928 (82 y.o. Female) Treating RN: Carolyne Fiscal, Debi Primary Care Physician: [name] Other Clinician: Referring Physician: [name] Treating Physician/Extender: Frann Rider in Treatment: 4 Wound Status Wound Number: 1 Primary Trauma, Other Etiology: Wound Location: Right Lower Leg - Lateral, Proximal Wound Open Status: Wounding Event: Trauma Comorbid Anemia, Hypertension, Osteoarthritis, Date Acquired: 04/20/2015 History: Received Chemotherapy Weeks Of Treatment: 4 Clustered Wound: No Photos Photo Uploaded By: Alric Quan on 06/18/2015 11:49:56 Wound Measurements Length: (cm) 0.9 % Reduction in Width: (cm) 0.3 % Reduction in Depth: (cm) 0.1 Epithelializati Area: (cm) 0.212 Tunneling: Volume: (cm) 0.021 Undermining: Area: 96% Volume: 96% on: None No No Wound Description Classification: Partial Thickness Wound Margin: Flat and Intact Exudate Amount: Large Exudate Type: Serosanguineous Exudate Color: red, brown Foul Odor After Cleansing: No Wound Bed Granulation Amount: Large (67-100%) Exposed  Structure Granulation Quality: Pink Fascia Exposed: No Necrotic Amount: Small (1-33%) Fat Layer Exposed: No [name], [name] ([id number]) Necrotic Quality: Adherent Slough Tendon Exposed: No Muscle Exposed: No Joint Exposed: No Bone Exposed: No Limited to Skin Breakdown Periwound Skin Texture Texture Color No Abnormalities Noted: No No Abnormalities Noted: No Localized Edema: Yes Temperature / Pain Moisture Temperature: No Abnormality No Abnormalities Noted: No Tenderness on Palpation: Yes Moist: Yes Wound Preparation Ulcer Cleansing: Rinsed/Irrigated with Saline Topical Anesthetic Applied: Other: lidocaine 4%, Treatment Notes Wound #1 (Right, Proximal, Lateral Lower Leg) 1. Cleansed with: Clean wound with Normal Saline 4. Dressing Applied: Prisma Ag 5. Secondary Dressing Applied Bordered Foam Dressing 7. Secured with Patient to wear own compression stockings Electronic Signature(s) Signed: 06/18/2015 5:52:45 PM By: Alric Quan Entered By: Alric Quan on 06/18/2015 10:56:50 [name] ([id number]) -------------------------------------------------------------------------------- [place] Details Patient Name: [name] Date of Service: 06/18/2015 10:45 AM Medical Record Number: [id number] Patient Account Number: [id number] Date of Birth/Sex: [birth year] (82 y.o. Female) Treating RN: Carolyne Fiscal, Debi Primary Care Physician: [name] Other Clinician: Referring Physician: [name] Treating Physician/Extender: Frann Rider in Treatment: 4 Vital Signs Time Taken: 10:49 Temperature (F): 98.1 Height (in): 61 Pulse (bpm): 69 Weight (lbs): 142 Respiratory Rate (breaths/min): 20 Body Mass Index (BMI): 26.8 Blood Pressure (mmHg): 139/44 Reference Range: 80 - 120 mg / dl Electronic Signature(s) Signed: 06/18/2015 5:52:45 PM By: Alric Quan Entered By: Alric Quan on 06/18/2015 10:51:59

## 2015-06-23 ENCOUNTER — Ambulatory Visit: Payer: Commercial Managed Care - HMO | Admitting: Internal Medicine

## 2015-06-23 ENCOUNTER — Inpatient Hospital Stay: Payer: Commercial Managed Care - HMO

## 2015-06-25 ENCOUNTER — Encounter: Payer: Self-pay | Admitting: Internal Medicine

## 2015-06-25 ENCOUNTER — Ambulatory Visit (INDEPENDENT_AMBULATORY_CARE_PROVIDER_SITE_OTHER): Payer: Commercial Managed Care - HMO | Admitting: Internal Medicine

## 2015-06-25 ENCOUNTER — Encounter: Payer: Commercial Managed Care - HMO | Admitting: Surgery

## 2015-06-25 VITALS — BP 122/70 | HR 62 | Temp 98.4°F | Resp 17 | Wt 146.1 lb

## 2015-06-25 DIAGNOSIS — E876 Hypokalemia: Secondary | ICD-10-CM | POA: Diagnosis not present

## 2015-06-25 DIAGNOSIS — K529 Noninfective gastroenteritis and colitis, unspecified: Secondary | ICD-10-CM | POA: Diagnosis not present

## 2015-06-25 DIAGNOSIS — I890 Lymphedema, not elsewhere classified: Secondary | ICD-10-CM | POA: Diagnosis not present

## 2015-06-25 DIAGNOSIS — Z85038 Personal history of other malignant neoplasm of large intestine: Secondary | ICD-10-CM | POA: Diagnosis not present

## 2015-06-25 DIAGNOSIS — D649 Anemia, unspecified: Secondary | ICD-10-CM | POA: Diagnosis not present

## 2015-06-25 DIAGNOSIS — G8929 Other chronic pain: Secondary | ICD-10-CM | POA: Diagnosis not present

## 2015-06-25 DIAGNOSIS — M1990 Unspecified osteoarthritis, unspecified site: Secondary | ICD-10-CM | POA: Diagnosis not present

## 2015-06-25 DIAGNOSIS — K219 Gastro-esophageal reflux disease without esophagitis: Secondary | ICD-10-CM | POA: Diagnosis not present

## 2015-06-25 DIAGNOSIS — M549 Dorsalgia, unspecified: Secondary | ICD-10-CM | POA: Diagnosis not present

## 2015-06-25 DIAGNOSIS — I10 Essential (primary) hypertension: Secondary | ICD-10-CM | POA: Diagnosis not present

## 2015-06-25 DIAGNOSIS — Z09 Encounter for follow-up examination after completed treatment for conditions other than malignant neoplasm: Secondary | ICD-10-CM | POA: Diagnosis not present

## 2015-06-25 DIAGNOSIS — S81801D Unspecified open wound, right lower leg, subsequent encounter: Secondary | ICD-10-CM | POA: Diagnosis not present

## 2015-06-25 DIAGNOSIS — L97212 Non-pressure chronic ulcer of right calf with fat layer exposed: Secondary | ICD-10-CM | POA: Diagnosis not present

## 2015-06-25 MED ORDER — ONDANSETRON HCL 4 MG PO TABS: 4.0000 mg | ORAL_TABLET | Freq: Two times a day (BID) | ORAL | Status: DC | PRN

## 2015-06-25 MED ORDER — LOSARTAN POTASSIUM 50 MG PO TABS: 50.0000 mg | ORAL_TABLET | Freq: Every day | ORAL | Status: DC

## 2015-06-25 NOTE — Progress Notes (Signed)
Patient ID: [name], female   DOB: 01-May-1928, 82 y.o.   MRN: [id number]   Subjective:    Patient ID: [name], female    DOB: [birth year], 82 y.o.   MRN: [id number]  HPI  Patient here for hospital follow up.   She is accompanied by her husband.  History obtained from both of them.  She was admitted 06/11/15 with abdominal pain and emesis.  CT revealed ileitis.  See CT report.  Was treated empirically with cipro and flagyl.  Completed.  Blood pressure medication adjusted.  She reports since being home, her abdominal pain has improved/resolved.  Bowels moving.  No nausea or vomiting.  Eating.  Breathing stable.  No urine change.  She has also been released from wound center.  Last visit today.  Husband reports cough since starting lisinopril.     Past Medical History  Diagnosis Date  . Bowel obstruction (HCC)     s/p adhesion resection  . Recurrent sinus infections   . Vertigo   . Barrett's esophagus   . Hiatal hernia   . Hypercholesteremia   . Hypertension   . Chronic back pain   . Peripheral neuropathy ([place])   . Colon cancer ([place]) 1988 and 1989    adenocarcinoma, s/p resection x  2 and chemo  . S/P chemotherapy, time since greater than 12 weeks     colon cancer  . Diverticulitis   . Lumbar scoliosis   . Renal cyst   . H/O open leg wound    Past Surgical History  Procedure Laterality Date  . Colon resection      x2.  s/p colon cancer  . Adhesions resected      bowel obstruction  . Cholecystectomy    . Abdominal hysterectomy  1982  . Appendectomy    . Excisional hemorrhoidectomy      with tubal ligation  . Breast biopsy Left     negative 06/14/1985   Family History  Problem Relation Age of Onset  . Breast cancer Mother   . Stroke Father   . Hypertension Father   . Diabetes Father   . Asthma Mother   . Ovarian cancer Sister     x2  . Prostate cancer Brother   . Hypertension Brother     x3  . Hypercholesterolemia Brother     x3  . Diabetes Brother     Social History   Social History  . Marital Status: Married    Spouse Name: N/A  . Number of Children: 4  . Years of Education: 12th grade   Occupational History  . homemaker    Social History Main Topics  . Smoking status: Never Smoker   . Smokeless tobacco: Never Used  . Alcohol Use: No  . Drug Use: No  . Sexual Activity: No   Other Topics Concern  . None   Social History Narrative    Outpatient Encounter Prescriptions as of 06/25/2015  Medication Sig  . Calcium Carbonate-Vitamin D (CALCIUM 600 + D PO) Take 600 Units by mouth 2 (two) times daily with a meal.   . estradiol (ESTRACE) 2 MG tablet TAKE 1 TABLET EVERY DAY  . fexofenadine (ALLEGRA) 60 MG tablet TAKE 1 TABLET EVERY DAY  . fish oil-omega-3 fatty acids 1000 MG capsule Take 2 g by mouth daily.  . meclizine (ANTIVERT) 25 MG tablet Take 25 mg by mouth 3 (three) times daily as needed.  . meloxicam (MOBIC) 7.5 MG tablet Take 7.5 mg  by mouth daily.  . metoCLOPramide (REGLAN) 5 MG tablet Take 5 mg by mouth. Take one tablet three times a day  . metoprolol succinate (TOPROL-XL) 25 MG 24 hr tablet Take 1 tablet (25 mg total) by mouth daily.  Marland Kitchen oxyCODONE (OXY IR/ROXICODONE) 5 MG immediate release tablet Take 5 mg by mouth 3 (three) times daily.   . pantoprazole (PROTONIX) 40 MG tablet Take 40 mg by mouth 2 (two) times daily.  . simvastatin (ZOCOR) 10 MG tablet TAKE 1 TABLET AT BEDTIME  . traMADol-acetaminophen (ULTRACET) 37.5-325 MG per tablet Take 1 tablet by mouth 2 (two) times daily as needed.   . [DISCONTINUED] ciprofloxacin (CIPRO) 500 MG tablet Take 1 tablet (500 mg total) by mouth 2 (two) times daily.  . [DISCONTINUED] collagenase (SANTYL) ointment Apply to right lower extremity daily  . [DISCONTINUED] lactase (LACTAID) 3000 UNITS tablet Take 1 tablet by mouth as needed.  . [DISCONTINUED] lidocaine (LIDODERM) 5 % Place 3 patches onto the skin daily. Reported on 06/13/2015  . [DISCONTINUED] lisinopril  (PRINIVIL,ZESTRIL) 10 MG tablet Take 1 tablet (10 mg total) by mouth daily.  . [DISCONTINUED] metroNIDAZOLE (FLAGYL) 250 MG tablet Take 1 tablet (250 mg total) by mouth 3 (three) times daily.  Marland Kitchen losartan (COZAAR) 50 MG tablet Take 1 tablet (50 mg total) by mouth daily.  . ondansetron (ZOFRAN) 4 MG tablet Take 1 tablet (4 mg total) by mouth 2 (two) times daily as needed for nausea or vomiting.   No facility-administered encounter medications on file as of 06/25/2015.    Review of Systems  Constitutional: Negative for appetite change and unexpected weight change.  Respiratory: Positive for cough. Negative for chest tightness and shortness of breath.   Cardiovascular: Negative for chest pain, palpitations and leg swelling.  Gastrointestinal: Negative for nausea, vomiting, abdominal pain (chronic) and diarrhea.  Genitourinary: Negative for dysuria and difficulty urinating.  Musculoskeletal: Positive for back pain. Negative for myalgias.  Neurological: Negative for dizziness, light-headedness and headaches.  Psychiatric/Behavioral: Negative for dysphoric mood and agitation.       Objective:    Physical Exam  Constitutional: She appears well-developed and well-nourished. No distress.  HENT:  Nose: Nose normal.  Mouth/Throat: Oropharynx is clear and moist.  Neck: Neck supple. No thyromegaly present.  Cardiovascular: Normal rate and regular rhythm.   Pulmonary/Chest: Breath sounds normal. No respiratory distress. She has no wheezes.  Abdominal: Soft. Bowel sounds are normal. There is no tenderness.  Musculoskeletal: She exhibits no edema or tenderness.  Lymphadenopathy:    She has no cervical adenopathy.  Skin: No rash noted. No erythema.  Psychiatric: She has a normal mood and affect. Her behavior is normal.    BP 122/70 mmHg  Pulse 62  Temp(Src) 98.4 F (36.9 C) (Oral)  Resp 17  Wt 146 lb 2 oz (66.282 kg)  SpO2 95% Wt Readings from Last 3 Encounters:  06/25/15 146 lb 2 oz  (66.282 kg)  06/12/15 144 lb 11.2 oz (65.635 kg)  05/19/15 143 lb 6.4 oz (65.046 kg)     Lab Results  Component Value Date   WBC 5.5 06/13/2015   HGB 12.1 06/13/2015   HCT 35.0 06/13/2015   PLT 239 06/13/2015   GLUCOSE 105* 06/25/2015   CHOL 170 05/21/2015   TRIG 127.0 05/21/2015   HDL 71.40 05/21/2015   LDLCALC 73 05/21/2015   ALT 17 06/11/2015   AST 23 06/11/2015   NA 139 06/25/2015   K 4.0 06/25/2015   CL 103 06/25/2015  CREATININE 1.14 06/25/2015   BUN 20 06/25/2015   CO2 26 06/25/2015   TSH 2.31 05/21/2015   INR 0.91 06/11/2015    Ct Abdomen Pelvis W Contrast  06/11/2015  CLINICAL DATA:  Acute onset of nausea, vomiting and abdominal tenderness. Initial encounter. EXAM: CT ABDOMEN AND PELVIS WITH CONTRAST TECHNIQUE: Multidetector CT imaging of the abdomen and pelvis was performed using the standard protocol following bolus administration of intravenous contrast. CONTRAST:  36mL ISOVUE-300 IOPAMIDOL (ISOVUE-300) INJECTION 61% COMPARISON:  MRI of the lumbar spine performed 01/20/2015, and CT of the abdomen and pelvis from 02/06/2008 FINDINGS: The visualized lung bases are clear. A moderate hiatal hernia is noted. The liver is unremarkable in appearance. Numerous calcified granulomata are noted within the spleen. The patient is status post cholecystectomy, with clips noted at the gallbladder fossa. The pancreas and adrenal glands are unremarkable. A 4.9 cm cyst is noted at the upper pole of the left kidney, demonstrating minimal peripheral wall thickening. This has decreased in size from 2009. The kidneys are otherwise grossly unremarkable. There is no evidence of hydronephrosis. No renal or ureteral stones are identified. No perinephric stranding is seen. There is distention of small-bowel loops to 3.0 cm in maximal diameter, aside from more prominent chronic distention of a small bowel suture line at the right lower quadrant. There is gradual decompression, with mild wall thickening  along the distal ileum, and surrounding trace fluid and soft tissue inflammation. There is mild fecalization at this region. Findings are concerning for acute infectious or inflammatory ileitis. No focal transition point is seen. The stomach is within normal limits. No acute vascular abnormalities are seen. Postoperative change is noted about the right lower quadrant, with an ileocolic anastomosis along the ascending colon. The colon is otherwise unremarkable. The bladder is moderately distended and grossly unremarkable. The patient is status post hysterectomy. No suspicious adnexal masses are seen. No inguinal lymphadenopathy is seen. No acute osseous abnormalities are identified. Left convex lumbar scoliosis is noted. Multilevel vacuum phenomenon is noted along the lower thoracic and lumbar spine. IMPRESSION: 1. Gradual decompression of small bowel, with mild wall thickening along the distal ileum, and surrounding trace fluid and soft tissue inflammation. Mild fecalization noted at this region. Findings concerning for acute infectious or inflammatory ileitis. No focal transition point seen to suggest bowel obstruction. 2. Moderate hiatal hernia noted. 3. Large left renal cyst has decreased in size from 2009. Minimal peripheral wall thickening at the cyst is also stable. 4. Left convex lumbar scoliosis noted. Mild degenerative change along the lower thoracic and lumbar spine. Electronically Signed   By: Garald Balding M.D.   On: 06/11/2015 22:24   Dg Abd Acute W/chest  06/11/2015  CLINICAL DATA:  Patient with severe abdominal pain. Nausea and vomiting. EXAM: DG ABDOMEN ACUTE W/ 1V CHEST COMPARISON:  Chest radiograph 01/13/2015 FINDINGS: Patient is rotated to the left. Stable cardiac and mediastinal contours. No consolidative pulmonary opacities. No pleural effusion or pneumothorax. Relative paucity of bowel gas. Postsurgical changes within the right hemi abdomen. No free intraperitoneal air. Leftward curvature  of the lumbar spine with associated degenerative changes. IMPRESSION: Paucity of small bowel gas. No acute cardiopulmonary process. Electronically Signed   By: Lovey Newcomer M.D.   On: 06/11/2015 21:14       Assessment & Plan:   Problem List Items Addressed This Visit    Acute Ileitis    Off abx.  Pain resolved.  Bowels moving.  Schedule f/u with GI.  Relevant Orders   Ambulatory referral to Gastroenterology   Hospital discharge follow-up    Admitted with ileitis.  Doing better now.  Eating.  No pain.  Schedule f/u with GI.       Hypertension    Blood pressure doing well.  She is having cough with lisinopril.  Will start her on losartan 50mg  q day.  Follow pressures.  Follow metabolic panel.        Relevant Medications   losartan (COZAAR) 50 MG tablet   Hypokalemia - Primary    Recheck potassium today.       Relevant Orders   Basic Metabolic Panel (BMET) (Completed)       [name], MD

## 2015-06-25 NOTE — Progress Notes (Signed)
Pre-visit discussion using our clinic review tool. No additional management support is needed unless otherwise documented below in the visit note.  

## 2015-06-25 NOTE — Patient Instructions (Signed)
Stop lisinopril  Start losartan 50mg  - one per day.

## 2015-06-26 LAB — BASIC METABOLIC PANEL
BUN: 20 mg/dL (ref 6–23)
CALCIUM: 9.5 mg/dL (ref 8.4–10.5)
CHLORIDE: 103 meq/L (ref 96–112)
CO2: 26 meq/L (ref 19–32)
Creatinine, Ser: 1.14 mg/dL (ref 0.40–1.20)
GFR: 48.22 mL/min — ABNORMAL LOW (ref >60.00)
Glucose, Bld: 105 mg/dL — ABNORMAL HIGH (ref 70–99)
Potassium: 4 mEq/L (ref 3.5–5.1)
SODIUM: 139 meq/L (ref 135–145)

## 2015-06-26 NOTE — Progress Notes (Signed)
[name], [name] ([id number]) Visit Report for 06/25/2015 Chief Complaint Document Details Patient Name: [name], [name] 06/25/2015 1:30 Date of Service: PM Medical Record [id number] Number: Patient Account Number: [id number] 1928-12-01 (82 y.o. Treating RN: [name] Date of Birth/Sex: Female) Other Clinician: Primary Care Physician: [name] Treating [name] Referring Physician: [name] Physician/Extender: [name] in Treatment: 5 Information Obtained from: Patient Chief Complaint Patient presents to the wound care center for a consult due non healing wound which she's had for about 2 months Electronic Signature(s) Signed: 06/25/2015 2:28:06 PM By: [name] MD, FACS Entered By: [name] on 06/25/2015 14:28:06 [name] ([id number]) -------------------------------------------------------------------------------- HPI Details Patient Name: [name] 06/25/2015 1:30 Date of Service: PM Medical Record [id number] Number: Patient Account Number: [id number] 08-24-1928 (82 y.o. Treating RN: [name] Date of Birth/Sex: Female) Other Clinician: Primary Care Physician: [name] Treating [name] Referring Physician: [name] Physician/Extender: Weeks in Treatment: 5 History of Present Illness Location: right lateral calf area Quality: Patient reports No Pain. Severity: Patient states wound are getting worse. Duration: Patient has had the wound for > 2 months prior to seeking treatment at the wound center Context: The wound occurred when the patient injured her leg and bruised it against a blunt object Modifying Factors: Consults to this date include:local care and no antibiotics Associated Signs and Symptoms: Patient reports having increase swelling. HPI Description: 82 year old patient who had a blunt injury to her right lower extremity about a few weeks ago now noticed that the wound had opened out and started  draining fluid. She is known to have chronic lower extremity edema which has been there for several years. The PCP feels that the edema has not related to anemia or CHF. The patient's past medical history significant for adenocarcinoma of the colon status post resection, bowel obstruction with adhesions, essential hypertension, vertigo,chronic back pain, status post cholecystectomy, hysterectomy, appendectomy, colon surgery, bowel adhesion resection, hemorrhoid surgery. She has never been a smoker. 06/18/2015 -- the patient was admitted to hospital between 06/11/2015 and 06/14/2015 with an acute ileitis, hypokalemia and accelerated hypertension. was put on empiric Cipro and Flagyl. She is doing fine at the present time. Electronic Signature(s) Signed: 06/25/2015 2:28:14 PM By: [name] MD, FACS Entered By: [name] on 06/25/2015 14:28:14 [name] ([id number]) -------------------------------------------------------------------------------- Physical Exam Details Patient Name: [name] 06/25/2015 1:30 Date of Service: PM Medical Record [id number] Number: Patient Account Number: [id number] [birth year] (82 y.o. Treating RN: [name] Date of Birth/Sex: Female) Other Clinician: Primary Care Physician: [name] Treating [name] Referring Physician: [name] Physician/Extender: Weeks in Treatment: 5 Constitutional . Pulse regular. Respirations normal and unlabored. Afebrile. . Eyes Nonicteric. Reactive to light. Ears, Nose, Mouth, and Throat Lips, teeth, and gums WNL.Marland Kitchen Moist mucosa without lesions. Neck supple and nontender. No palpable supraclavicular or cervical adenopathy. Normal sized without goiter. Respiratory WNL. No retractions.. Breath sounds WNL, No rubs, rales, rhonchi, or wheeze.. Cardiovascular Heart rhythm and rate regular, no murmur or gallop.. Pedal Pulses WNL. No clubbing, cyanosis or edema. Lymphatic No adneopathy. No  adenopathy. No adenopathy. Musculoskeletal Adexa without tenderness or enlargement.. Digits and nails w/o clubbing, cyanosis, infection, petechiae, ischemia, or inflammatory conditions.. Integumentary (Hair, Skin) No suspicious lesions. No crepitus or fluctuance. No peri-wound warmth or erythema. No masses.Marland Kitchen Psychiatric Judgement and insight Intact.. No evidence of depression, anxiety, or agitation.. Notes the wound is completely healed Electronic Signature(s) Signed: 06/25/2015 2:28:35 PM By: [name] MD, FACS Entered By: [name]  on 06/25/2015 14:28:34 [name], [name] ([id number]) -------------------------------------------------------------------------------- Physician Orders Details Patient Name: [name], [name] 06/25/2015 1:30 Date of Service: PM Medical Record [id number] Number: Patient Account Number: [id number] [birth year] (82 y.o. Treating RN: [name] Date of Birth/Sex: Female) Other Clinician: Primary Care Physician: [name] Treating [name] Referring Physician: [name] Physician/Extender: [name] in Treatment: 5 Verbal / Phone Orders: No Diagnosis Coding Discharge From [facility] Services o Discharge from [facility] new skin protected, clean and dry. Call us if you need anything or if you have any questions. Electronic Signature(s) Signed: 06/25/2015 4:06:04 PM By: [name] MD, FACS Signed: 06/25/2015 4:42:35 PM By: Alric Quan Entered By: Alric Quan on 06/25/2015 13:55:42 [name] ([id number]) -------------------------------------------------------------------------------- Problem List Details Patient Name: [name], [name] 06/25/2015 1:30 Date of Service: PM Medical Record [id number] Number: Patient Account Number: [id number] July 31, 1928 (82 y.o. Treating RN: [name] Date of Birth/Sex: Female) Other Clinician: Primary Care Physician: [name] Treating [name] Referring  Physician: [name] Physician/Extender: [name] in Treatment: 5 Active Problems ICD-10 Encounter Code Description Active Date Diagnosis I89.0 Lymphedema, not elsewhere classified 05/21/2015 Yes L97.212 Non-pressure chronic ulcer of right calf with fat layer 05/21/2015 Yes exposed Inactive Problems Resolved Problems Electronic Signature(s) Signed: 06/25/2015 2:27:59 PM By: [name] MD, FACS Entered By: [name] on 06/25/2015 14:27:58 [name] ([id number]) -------------------------------------------------------------------------------- Progress Note Details Patient Name: [name] 06/25/2015 1:30 Date of Service: PM Medical Record [id number] Number: Patient Account Number: [id number] [birth year] (82 y.o. Treating RN: [name] Date of Birth/Sex: Female) Other Clinician: Primary Care Physician: [name] Treating [name] Referring Physician: [name] Physician/Extender: [name] in Treatment: 5 Subjective Chief Complaint Information obtained from Patient Patient presents to the wound care center for a consult due non healing wound which she's had for about 2 months History of Present Illness (HPI) The following HPI elements were documented for the patient's wound: Location: right lateral calf area Quality: Patient reports No Pain. Severity: Patient states wound are getting worse. Duration: Patient has had the wound for > 2 months prior to seeking treatment at the wound center Context: The wound occurred when the patient injured her leg and bruised it against a blunt object Modifying Factors: Consults to this date include:local care and no antibiotics Associated Signs and Symptoms: Patient reports having increase swelling. 82 year old patient who had a blunt injury to her right lower extremity about a few weeks ago now noticed that the wound had opened out and started draining fluid. She is known to have chronic lower  extremity edema which has been there for several years. The PCP feels that the edema has not related to anemia or CHF. The patient's past medical history significant for adenocarcinoma of the colon status post resection, bowel obstruction with adhesions, essential hypertension, vertigo,chronic back pain, status post cholecystectomy, hysterectomy, appendectomy, colon surgery, bowel adhesion resection, hemorrhoid surgery. She has never been a smoker. 06/18/2015 -- the patient was admitted to hospital between 06/11/2015 and 06/14/2015 with an acute ileitis, hypokalemia and accelerated hypertension. was put on empiric Cipro and Flagyl. She is doing fine at the present time. Objective Constitutional [name], [name] ([id number]) Pulse regular. Respirations normal and unlabored. Afebrile. Vitals Time Taken: 1:36 PM, Height: 61 in, Weight: 142 lbs, BMI: 26.8, Temperature: 98.2 F, Pulse: 63 bpm, Respiratory Rate: 20 breaths/min, Blood Pressure: 125/43 mmHg. Eyes Nonicteric. Reactive to light. Ears, Nose, Mouth, and Throat Lips, teeth, and gums WNL.Marland Kitchen Moist mucosa without lesions. Neck supple and  nontender. No palpable supraclavicular or cervical adenopathy. Normal sized without goiter. Respiratory WNL. No retractions.. Breath sounds WNL, No rubs, rales, rhonchi, or wheeze.. Cardiovascular Heart rhythm and rate regular, no murmur or gallop.. Pedal Pulses WNL. No clubbing, cyanosis or edema. Lymphatic No adneopathy. No adenopathy. No adenopathy. Musculoskeletal Adexa without tenderness or enlargement.. Digits and nails w/o clubbing, cyanosis, infection, petechiae, ischemia, or inflammatory conditions.Marland Kitchen Psychiatric Judgement and insight Intact.. No evidence of depression, anxiety, or agitation.. General Notes: the wound is completely healed Integumentary (Hair, Skin) No suspicious lesions. No crepitus or fluctuance. No peri-wound warmth or erythema. No masses.. Wound #1 status is Healed -  Epithelialized. Original cause of wound was Trauma. The wound is located on the Right,Proximal,Lateral Lower Leg. The wound measures 0cm length x 0cm width x 0cm depth; 0cm^2 area and 0cm^3 volume. Assessment Active Problems ICD-10 I89.0 - Lymphedema, not elsewhere classified [name], [name]. ([id number]) [phone] - Non-pressure chronic ulcer of right calf with fat layer exposed Having completely healed out the wound I have recommended a protective foam border to the use for the next week or so to protect the supple scar. She is discharged on the wound care services and will be seen back as needed Plan Discharge From [facility] Services: Discharge from [facility] new skin protected, clean and dry. Call us if you need anything or if you have any questions. Having completely healed out the wound I have recommended a protective foam border to the use for the next week or so to protect the supple scar. She is discharged on the wound care services and will be seen back as needed Electronic Signature(s) Signed: 06/25/2015 2:29:21 PM By: [name] MD, FACS Entered By: [name] on 06/25/2015 14:29:20 [name] ([id number]) -------------------------------------------------------------------------------- SuperBill Details Patient Name: [name] Date of Service: 06/25/2015 Medical Record Number: [id number] Patient Account Number: [id number] Date of Birth/Sex: 07/06/1928 (82 y.o. Female) Treating RN: Carolyne Fiscal, Debi Primary Care Physician: [name] Other Clinician: Referring Physician: [name] Treating Physician/Extender: Frann Rider in Treatment: 5 Diagnosis Coding ICD-10 Codes Code Description I89.0 Lymphedema, not elsewhere classified L97.212 Non-pressure chronic ulcer of right calf with fat layer exposed Facility Procedures CPT4 Code: [id number] Description: [id number] - WOUND CARE VISIT-LEV 2 EST PT Modifier: Quantity: 1 Physician  Procedures CPT4 Code: [id number] Description: [id number] - WC PHYS LEVEL 2 - EST PT ICD-10 Description Diagnosis I89.0 Lymphedema, not elsewhere classified L97.212 Non-pressure chronic ulcer of right calf with fat Modifier: layer exposed Quantity: 1 Electronic Signature(s) Signed: 06/25/2015 4:06:04 PM By: [name] MD, FACS Signed: 06/25/2015 4:42:35 PM By: Alric Quan Previous Signature: 06/25/2015 2:29:32 PM Version By: [name] MD, FACS Entered By: Alric Quan on 06/25/2015 14:47:52

## 2015-06-26 NOTE — Progress Notes (Signed)
KINDSEY, CHESSON ([id number]) Visit Report for 06/25/2015 Arrival Information Details Patient Name: [name], [name] Date of Service: 06/25/2015 1:30 PM Medical Record Number: [id number] Patient Account Number: [id number] Date of Birth/Sex: 09-18-1928 (82 y.o. Female) Treating RN: Carolyne Fiscal, Debi Primary Care Physician: [name] Other Clinician: Referring Physician: [name] Treating Physician/Extender: Frann Rider in Treatment: 5 Visit Information History Since Last Visit All ordered tests and consults were completed: No Patient Arrived: Gilford Rile Added or deleted any medications: No Arrival Time: 13:35 Any new allergies or adverse reactions: No Accompanied By: husband Had a fall or experienced change in No Transfer Assistance: Easy[employer] activities of daily living that may affect Patient Lift risk of falls: Patient Identification Verified: Yes Signs or symptoms of abuse/neglect since last No Secondary Verification Process Yes visito Completed: Hospitalized since last visit: No Patient Requires Transmission- No Pain Present Now: No Based Precautions: Patient Has Alerts: No Electronic Signature(s) Signed: 06/25/2015 4:42:35 PM By: Alric Quan Entered By: Alric Quan on 06/25/2015 13:35:22 [name] ([id number]) -------------------------------------------------------------------------------- Clinic Level of Care Assessment Details Patient Name: [name] Date of Service: 06/25/2015 1:30 PM Medical Record Number: [id number] Patient Account Number: [id number] Date of Birth/Sex: March 01, 1928 (82 y.o. Female) Treating RN: Carolyne Fiscal, Debi Primary Care Physician: [name] Other Clinician: Referring Physician: [name] Treating Physician/Extender: Frann Rider in Treatment: 5 Clinic Level of Care Assessment Items TOOL 4 Quantity Score X - Use when only an EandM is performed on FOLLOW-UP visit 1 0 ASSESSMENTS - Nursing  Assessment / Reassessment X - Reassessment of Co-morbidities (includes updates in patient status) 1 10 X - Reassessment of Adherence to Treatment Plan 1 5 ASSESSMENTS - Wound and Skin Assessment / Reassessment X - Simple Wound Assessment / Reassessment - one wound 1 5 []  - Complex Wound Assessment / Reassessment - multiple wounds 0 []  - Dermatologic / Skin Assessment (not related to wound area) 0 ASSESSMENTS - Focused Assessment []  - Circumferential Edema Measurements - multi extremities 0 []  - Nutritional Assessment / Counseling / Intervention 0 []  - Lower Extremity Assessment (monofilament, tuning fork, pulses) 0 []  - Peripheral Arterial Disease Assessment (using hand held doppler) 0 ASSESSMENTS - Ostomy and/or Continence Assessment and Care []  - Incontinence Assessment and Management 0 []  - Ostomy Care Assessment and Management (repouching, etc.) 0 PROCESS - Coordination of Care X - Simple Patient / Family Education for ongoing care 1 15 []  - Complex (extensive) Patient / Family Education for ongoing care 0 []  - Staff obtains Programmer, systems, Records, Test Results / Process Orders 0 []  - Staff telephones HHA, Nursing Homes / Clarify orders / etc 0 []  - Routine Transfer to another Facility (non-emergent condition) 0 [name], Iyanah N. ([id number]) []  - Routine Hospital Admission (non-emergent condition) 0 []  - New Admissions / Biomedical engineer / Ordering NPWT, Apligraf, etc. 0 []  - Emergency Hospital Admission (emergent condition) 0 X - Simple Discharge Coordination 1 10 []  - Complex (extensive) Discharge Coordination 0 PROCESS - Special Needs []  - Pediatric / Minor Patient Management 0 []  - Isolation Patient Management 0 []  - Hearing / Language / Visual special needs 0 []  - Assessment of Community assistance (transportation, D/C planning, etc.) 0 []  - Additional assistance / Altered mentation 0 []  - Support Surface(s) Assessment (bed, cushion, seat, etc.) 0 INTERVENTIONS - Wound  Cleansing / Measurement X - Simple Wound Cleansing - one wound 1 5 []  - Complex Wound Cleansing - multiple wounds 0 X - Wound Imaging (photographs - any number of wounds)  1 5 []  - Wound Tracing (instead of photographs) 0 []  - Simple Wound Measurement - one wound 0 []  - Complex Wound Measurement - multiple wounds 0 INTERVENTIONS - Wound Dressings []  - Small Wound Dressing one or multiple wounds 0 []  - Medium Wound Dressing one or multiple wounds 0 []  - Large Wound Dressing one or multiple wounds 0 X - Application of Medications - topical 1 5 []  - Application of Medications - injection 0 INTERVENTIONS - Miscellaneous []  - External ear exam 0 [name], [name] ([id number]) []  - Specimen Collection (cultures, biopsies, blood, body fluids, etc.) 0 []  - Specimen(s) / Culture(s) sent or taken to Lab for analysis 0 []  - Patient Transfer (multiple staff / Harrel Lemon Lift / Similar devices) 0 []  - Simple Staple / Suture removal (25 or less) 0 []  - Complex Staple / Suture removal (26 or more) 0 []  - Hypo / Hyperglycemic Management (close monitor of Blood Glucose) 0 []  - Ankle / Brachial Index (ABI) - do not check if billed separately 0 X - Vital Signs 1 5 Has the patient been seen at the hospital within the last three years: Yes Total Score: 65 Level Of Care: New/Established - Level 2 Electronic Signature(s) Signed: 06/25/2015 4:42:35 PM By: Alric Quan Entered By: Alric Quan on 06/25/2015 14:47:32 [name] ([id number]) -------------------------------------------------------------------------------- Encounter Discharge Information Details Patient Name: [name] Date of Service: 06/25/2015 1:30 PM Medical Record Number: [id number] Patient Account Number: [id number] Date of Birth/Sex: Jul 01, 1928 (82 y.o. Female) Treating RN: Carolyne Fiscal, Debi Primary Care Physician: [name] Other Clinician: Referring Physician: [name] Treating Physician/Extender: Frann Rider in Treatment: 5 Encounter Discharge Information Items Discharge Pain Level: 0 Discharge Condition: Stable Ambulatory Status: Walker Discharge Destination: Home Transportation: Private Auto Accompanied By: husband Schedule Follow-up Appointment: No Medication Reconciliation completed Yes and provided to Patient/Care Peder Allums: Provided on Clinical Summary of Care: 06/25/2015 Form Type Recipient Paper Patient [facility] Electronic Signature(s) Signed: 06/25/2015 2:02:45 PM By: Ruthine Dose Entered By: Ruthine Dose on 06/25/2015 14:02:44 [name] ([id number]) -------------------------------------------------------------------------------- Lower Extremity Assessment Details Patient Name: [name] Date of Service: 06/25/2015 1:30 PM Medical Record Number: [id number] Patient Account Number: [id number] Date of Birth/Sex: Sep 06, 1928 (82 y.o. Female) Treating RN: Carolyne Fiscal, Debi Primary Care Physician: [name] Other Clinician: Referring Physician: [name] Treating Physician/Extender: Frann Rider in Treatment: 5 Vascular Assessment Pulses: Posterior Tibial Dorsalis Pedis Palpable: [Right:Yes] Extremity colors, hair growth, and conditions: Extremity Color: [Right:Mottled] Temperature of Extremity: [Right:Warm] Capillary Refill: [Right:< 3 seconds] Toe Nail Assessment Left: Right: Thick: No Discolored: No Deformed: No Improper Length and Hygiene: No Electronic Signature(s) Signed: 06/25/2015 4:42:35 PM By: Alric Quan Entered By: Alric Quan on 06/25/2015 13:38:21 [name] ([id number]) -------------------------------------------------------------------------------- Multi Wound Chart Details Patient Name: [name] Date of Service: 06/25/2015 1:30 PM Medical Record Number: [id number] Patient Account Number: [id number] Date of Birth/Sex: 1928-08-15 (82 y.o. Female) Treating RN: Carolyne Fiscal, Debi Primary Care  Physician: [name] Other Clinician: Referring Physician: [name] Treating Physician/Extender: Frann Rider in Treatment: 5 Vital Signs Height(in): 61 Pulse(bpm): 63 Weight(lbs): 142 Blood Pressure 125/43 (mmHg): Body Mass Index(BMI): 27 Temperature(F): 98.2 Respiratory Rate 20 (breaths/min): Photos: [1:No Photos] [N/A:N/A] Wound Location: [1:Right, Proximal, Lateral Lower Leg] [N/A:N/A] Wounding Event: [1:Trauma] [N/A:N/A] Primary Etiology: [1:Trauma, Other] [N/A:N/A] Date Acquired: [1:04/20/2015] [N/A:N/A] Weeks of Treatment: [1:5] [N/A:N/A] Wound Status: [1:Healed - Epithelialized] [N/A:N/A] Measurements L x W x D 0x0x0 [N/A:N/A] (cm) Area (cm) : [1:0] [N/A:N/A] Volume (cm) : [1:0] [  N/A:N/A] % Reduction in Area: [1:100.00%] [N/A:N/A] % Reduction in Volume: 100.00% [N/A:N/A] Classification: [1:Partial Thickness] [N/A:N/A] Periwound Skin Texture: No Abnormalities Noted [N/A:N/A] Periwound Skin [1:No Abnormalities Noted] [N/A:N/A] Moisture: Periwound Skin Color: No Abnormalities Noted [N/A:N/A] Tenderness on [1:No] [N/A:N/A] Treatment Notes Electronic Signature(s) Signed: 06/25/2015 4:42:35 PM By: Alric Quan Entered By: Alric Quan on 06/25/2015 13:41:05 [name] ([id number]) [name] ([id number]) -------------------------------------------------------------------------------- Lindsay Details Patient Name: [name] Date of Service: 06/25/2015 1:30 PM Medical Record Number: [id number] Patient Account Number: [id number] Date of Birth/Sex: 01/14/1929 (82 y.o. Female) Treating RN: Carolyne Fiscal, Debi Primary Care Physician: [name] Other Clinician: Referring Physician: [name] Treating Physician/Extender: Frann Rider in Treatment: 5 Active Inactive Electronic Signature(s) Signed: 06/25/2015 4:42:35 PM By: Alric Quan Entered By: Alric Quan on 06/25/2015  14:46:36 [name] ([id number]) -------------------------------------------------------------------------------- Pain Assessment Details Patient Name: [name] Date of Service: 06/25/2015 1:30 PM Medical Record Number: [id number] Patient Account Number: [id number] Date of Birth/Sex: January 04, 1929 (82 y.o. Female) Treating RN: Carolyne Fiscal, Debi Primary Care Physician: [name] Other Clinician: Referring Physician: [name] Treating Physician/Extender: Frann Rider in Treatment: 5 Active Problems Location of Pain Severity and Description of Pain Patient Has Paino No Site Locations Pain Management and Medication Current Pain Management: Electronic Signature(s) Signed: 06/25/2015 4:42:35 PM By: Alric Quan Entered By: Alric Quan on 06/25/2015 13:35:27 [name] ([id number]) -------------------------------------------------------------------------------- Patient/Caregiver Education Details Patient Name: [name] Date of Service: 06/25/2015 1:30 PM Medical Record Number: [id number] Patient Account Number: [id number] Date of Birth/Gender: January 27, 1929 (82 y.o. Female) Treating RN: Carolyne Fiscal, Debi Primary Care Physician: [name] Other Clinician: Referring Physician: [name] Treating Physician/Extender: Frann Rider in Treatment: 5 Education Assessment Education Provided To: Patient Education Topics Provided Wound/Skin Impairment: Other: Keep new skin protected, clean and dry. Call us if you need anything or if you have any Handouts: questions. Methods: Demonstration, Explain/Verbal Responses: State content correctly Electronic Signature(s) Signed: 06/25/2015 4:42:35 PM By: Alric Quan Entered By: Alric Quan on 06/25/2015 13:56:56 [name] ([id number]) -------------------------------------------------------------------------------- Wound Assessment Details Patient Name: [name] Date  of Service: 06/25/2015 1:30 PM Medical Record Number: [id number] Patient Account Number: [id number] Date of Birth/Sex: 10/11/1928 (82 y.o. Female) Treating RN: Carolyne Fiscal, Debi Primary Care Physician: [name] Other Clinician: Referring Physician: [name] Treating Physician/Extender: Frann Rider in Treatment: 5 Wound Status Wound Number: 1 Primary Etiology: Trauma, Other Wound Location: Right, Proximal, Lateral Lower Wound Status: Healed - Epithelialized Leg Wounding Event: Trauma Date Acquired: 04/20/2015 Weeks Of Treatment: 5 Clustered Wound: No Photos Photo Uploaded By: Alric Quan on 06/25/2015 16:36:14 Wound Measurements Length: (cm) 0 % Reduction Width: (cm) 0 % Reduction Depth: (cm) 0 Area: (cm) 0 Volume: (cm) 0 in Area: 100% in Volume: 100% Wound Description Classification: Partial Thickness Periwound Skin Texture Texture Color No Abnormalities Noted: No No Abnormalities Noted: No Moisture No Abnormalities Noted: No Electronic Signature(s) Signed: 06/25/2015 4:42:35 PM By: Darra Lis, Jon Billings ([id number]) Entered By: Alric Quan on 06/25/2015 13:40:50 [name] ([id number]) -------------------------------------------------------------------------------- Vitals Details Patient Name: [name] Date of Service: 06/25/2015 1:30 PM Medical Record Number: [id number] Patient Account Number: [id number] Date of Birth/Sex: 30-Apr-1928 (82 y.o. Female) Treating RN: Carolyne Fiscal, Debi Primary Care Physician: [name] Other Clinician: Referring Physician: [name] Treating Physician/Extender: Frann Rider in Treatment: 5 Vital Signs Time Taken: 13:36 Temperature (F): 98.2 Height (in): 61 Pulse (bpm): 63 Weight (lbs): 142 Respiratory Rate (breaths/min): 20 Body Mass Index (  BMI): 26.8 Blood Pressure (mmHg): 125/43 Reference Range: 80 - 120 mg / dl Electronic Signature(s) Signed: 06/25/2015  4:42:35 PM By: Alric Quan Entered By: Alric Quan on 06/25/2015 13:37:31

## 2015-06-28 ENCOUNTER — Encounter: Payer: Self-pay | Admitting: Internal Medicine

## 2015-06-28 NOTE — Assessment & Plan Note (Signed)
Blood pressure doing well.  She is having cough with lisinopril.  Will start her on losartan 50mg  q day.  Follow pressures.  Follow metabolic panel.

## 2015-06-28 NOTE — Assessment & Plan Note (Signed)
Off abx.  Pain resolved.  Bowels moving.  Schedule f/u with GI.

## 2015-06-28 NOTE — Assessment & Plan Note (Signed)
Recheck potassium today. 

## 2015-06-28 NOTE — Assessment & Plan Note (Signed)
Admitted with ileitis.  Doing better now.  Eating.  No pain.  Schedule f/u with GI.

## 2015-07-07 ENCOUNTER — Other Ambulatory Visit: Payer: Self-pay

## 2015-07-14 ENCOUNTER — Ambulatory Visit: Payer: Self-pay | Admitting: Internal Medicine

## 2015-07-15 DIAGNOSIS — R1084 Generalized abdominal pain: Secondary | ICD-10-CM | POA: Diagnosis not present

## 2015-07-16 ENCOUNTER — Encounter: Payer: Self-pay | Admitting: Internal Medicine

## 2015-07-16 ENCOUNTER — Ambulatory Visit (INDEPENDENT_AMBULATORY_CARE_PROVIDER_SITE_OTHER): Payer: Commercial Managed Care - HMO | Admitting: Internal Medicine

## 2015-07-16 ENCOUNTER — Telehealth: Payer: Self-pay

## 2015-07-16 ENCOUNTER — Ambulatory Visit
Admission: RE | Admit: 2015-07-16 | Discharge: 2015-07-16 | Disposition: A | Discharge status: Home / Self Care | Payer: Commercial Managed Care - HMO | Source: Ambulatory Visit | Attending: Internal Medicine | Admitting: Internal Medicine

## 2015-07-16 VITALS — BP 130/70 | HR 83 | Temp 98.6°F | Resp 18 | Ht 61.0 in | Wt 146.2 lb

## 2015-07-16 DIAGNOSIS — R600 Localized edema: Secondary | ICD-10-CM | POA: Diagnosis not present

## 2015-07-16 DIAGNOSIS — M7989 Other specified soft tissue disorders: Secondary | ICD-10-CM | POA: Insufficient documentation

## 2015-07-16 DIAGNOSIS — Z1239 Encounter for other screening for malignant neoplasm of breast: Secondary | ICD-10-CM | POA: Diagnosis not present

## 2015-07-16 DIAGNOSIS — K22719 Barrett's esophagus with dysplasia, unspecified: Secondary | ICD-10-CM

## 2015-07-16 DIAGNOSIS — M5416 Radiculopathy, lumbar region: Secondary | ICD-10-CM | POA: Diagnosis not present

## 2015-07-16 DIAGNOSIS — E7800 Pure hypercholesterolemia, unspecified: Secondary | ICD-10-CM | POA: Diagnosis not present

## 2015-07-16 DIAGNOSIS — M549 Dorsalgia, unspecified: Secondary | ICD-10-CM

## 2015-07-16 DIAGNOSIS — I10 Essential (primary) hypertension: Secondary | ICD-10-CM | POA: Diagnosis not present

## 2015-07-16 DIAGNOSIS — M5136 Other intervertebral disc degeneration, lumbar region: Secondary | ICD-10-CM | POA: Diagnosis not present

## 2015-07-16 DIAGNOSIS — G8929 Other chronic pain: Secondary | ICD-10-CM

## 2015-07-16 DIAGNOSIS — M62830 Muscle spasm of back: Secondary | ICD-10-CM | POA: Diagnosis not present

## 2015-07-16 MED ORDER — LOSARTAN POTASSIUM 50 MG PO TABS: 50.0000 mg | ORAL_TABLET | Freq: Every day | ORAL | Status: DC

## 2015-07-16 NOTE — Telephone Encounter (Signed)
Ok.  Thank you.  Please call pt and let her know.  Thanks

## 2015-07-16 NOTE — Telephone Encounter (Signed)
Mallory from [facility] called,  Negative for DVT in Left leg.   Thanks, Please advise patient.

## 2015-07-16 NOTE — Progress Notes (Signed)
Pre-visit discussion using our clinic review tool. No additional management support is needed unless otherwise documented below in the visit note.  

## 2015-07-16 NOTE — Progress Notes (Signed)
Patient ID: [name], female   DOB: [birth year], 82 y.o.   MRN: [id number]   Subjective:    Patient ID: [name], female    DOB: 03-03-1928, 82 y.o.   MRN: [id number]  HPI  Patient here for a scheduled follow up.  She is accompanied by her husband.  History obtained from both of them.  Was just admitted 06/11/15 with abdominal pain.  Found to have partial SBO.  See last note and hospital note. Pain resolved now.  Eating and drinking.  No nausea or vomiting.  Bowels stable.  In the hospital, blood pressure medication was changed to lisinopril.  Had cough with lisinopril.  Last visit, changed to losartan.  Potassium wnl.  Energy is getting better.  She is walking around her house.     Past Medical History  Diagnosis Date  . Bowel obstruction (HCC)     s/p adhesion resection  . Recurrent sinus infections   . Vertigo   . Barrett's esophagus   . Hiatal hernia   . Hypercholesteremia   . Hypertension   . Chronic back pain   . Peripheral neuropathy ([place])   . Colon cancer ([place]) 1988 and 1989    adenocarcinoma, s/p resection x  2 and chemo  . S/P chemotherapy, time since greater than 12 weeks     colon cancer  . Diverticulitis   . Lumbar scoliosis   . Renal cyst   . H/O open leg wound    Past Surgical History  Procedure Laterality Date  . Colon resection      x2.  s/p colon cancer  . Adhesions resected      bowel obstruction  . Cholecystectomy    . Abdominal hysterectomy  1982  . Appendectomy    . Excisional hemorrhoidectomy      with tubal ligation  . Breast biopsy Left     negative 06/14/1985   Family History  Problem Relation Age of Onset  . Breast cancer Mother   . Stroke Father   . Hypertension Father   . Diabetes Father   . Asthma Mother   . Ovarian cancer Sister     x2  . Prostate cancer Brother   . Hypertension Brother     x3  . Hypercholesterolemia Brother     x3  . Diabetes Brother    Social History   Social History  . Marital Status: Married   Spouse Name: N/A  . Number of Children: 4  . Years of Education: 12th grade   Occupational History  . homemaker    Social History Main Topics  . Smoking status: Never Smoker   . Smokeless tobacco: Never Used  . Alcohol Use: No  . Drug Use: No  . Sexual Activity: No   Other Topics Concern  . None   Social History Narrative    Outpatient Encounter Prescriptions as of 07/16/2015  Medication Sig  . Calcium Carbonate-Vitamin D (CALCIUM 600 + D PO) Take 600 Units by mouth 2 (two) times daily with a meal.   . estradiol (ESTRACE) 2 MG tablet TAKE 1 TABLET EVERY DAY  . fexofenadine (ALLEGRA) 60 MG tablet TAKE 1 TABLET EVERY DAY  . fish oil-omega-3 fatty acids 1000 MG capsule Take 2 g by mouth daily.  Marland Kitchen losartan (COZAAR) 50 MG tablet Take 1 tablet (50 mg total) by mouth daily.  . meclizine (ANTIVERT) 25 MG tablet Take 25 mg by mouth 3 (three) times daily as needed.  . meloxicam (MOBIC)  7.5 MG tablet Take 7.5 mg by mouth daily.  . metoCLOPramide (REGLAN) 5 MG tablet Take 5 mg by mouth. Take one tablet three times a day  . metoprolol succinate (TOPROL-XL) 25 MG 24 hr tablet Take 1 tablet (25 mg total) by mouth daily.  . ondansetron (ZOFRAN) 4 MG tablet Take 1 tablet (4 mg total) by mouth 2 (two) times daily as needed for nausea or vomiting.  Marland Kitchen oxyCODONE (OXY IR/ROXICODONE) 5 MG immediate release tablet Take 5 mg by mouth 3 (three) times daily.   . pantoprazole (PROTONIX) 40 MG tablet Take 40 mg by mouth 2 (two) times daily.  . simvastatin (ZOCOR) 10 MG tablet TAKE 1 TABLET AT BEDTIME  . traMADol-acetaminophen (ULTRACET) 37.5-325 MG per tablet Take 1 tablet by mouth 2 (two) times daily as needed.   . [DISCONTINUED] losartan (COZAAR) 50 MG tablet Take 1 tablet (50 mg total) by mouth daily.  . [DISCONTINUED] losartan (COZAAR) 50 MG tablet Take 1 tablet (50 mg total) by mouth daily.   No facility-administered encounter medications on file as of 07/16/2015.    Review of Systems  Constitutional:  Negative for appetite change and unexpected weight change.  HENT: Negative for congestion and sinus pressure.   Respiratory: Negative for cough, chest tightness and shortness of breath.   Cardiovascular: Negative for chest pain, palpitations and leg swelling.  Gastrointestinal: Negative for nausea, vomiting, abdominal pain and diarrhea.  Genitourinary: Negative for dysuria and difficulty urinating.  Musculoskeletal: Positive for back pain (chronic back pain.  saw Dr Sharlet Salina this am.  ). Negative for joint swelling.  Skin: Negative for color change and rash.  Neurological: Negative for dizziness, light-headedness and headaches.  Psychiatric/Behavioral: Negative for dysphoric mood and agitation.       Objective:    Physical Exam  Constitutional: She appears well-developed and well-nourished.  HENT:  Nose: Nose normal.  Mouth/Throat: Oropharynx is clear and moist.  Neck: Neck supple. No thyromegaly present.  Cardiovascular: Normal rate and regular rhythm.   Pulmonary/Chest: Breath sounds normal. No respiratory distress. She has no wheezes.  Abdominal: Soft. Bowel sounds are normal. There is no tenderness.  Musculoskeletal: She exhibits no edema or tenderness.  Lymphadenopathy:    She has no cervical adenopathy.  Skin: No rash noted. No erythema.  Psychiatric: She has a normal mood and affect. Her behavior is normal.    BP 130/70 mmHg  Pulse 83  Temp(Src) 98.6 F (37 C) (Oral)  Resp 18  Ht 5\' 1"  (1.549 m)  Wt 146 lb 4 oz (66.339 kg)  BMI 27.65 kg/m2  SpO2 96% Wt Readings from Last 3 Encounters:  07/16/15 146 lb 4 oz (66.339 kg)  06/25/15 146 lb 2 oz (66.282 kg)  06/12/15 144 lb 11.2 oz (65.635 kg)     Lab Results  Component Value Date   WBC 5.5 06/13/2015   HGB 12.1 06/13/2015   HCT 35.0 06/13/2015   PLT 239 06/13/2015   GLUCOSE 105* 06/25/2015   CHOL 170 05/21/2015   TRIG 127.0 05/21/2015   HDL 71.40 05/21/2015   LDLCALC 73 05/21/2015   ALT 17 06/11/2015   AST  23 06/11/2015   NA 139 06/25/2015   K 4.0 06/25/2015   CL 103 06/25/2015   CREATININE 1.14 06/25/2015   BUN 20 06/25/2015   CO2 26 06/25/2015   TSH 2.31 05/21/2015   INR 0.91 06/11/2015    Ct Abdomen Pelvis W Contrast  06/11/2015  CLINICAL DATA:  Acute onset of nausea, vomiting and  abdominal tenderness. Initial encounter. EXAM: CT ABDOMEN AND PELVIS WITH CONTRAST TECHNIQUE: Multidetector CT imaging of the abdomen and pelvis was performed using the standard protocol following bolus administration of intravenous contrast. CONTRAST:  49mL ISOVUE-300 IOPAMIDOL (ISOVUE-300) INJECTION 61% COMPARISON:  MRI of the lumbar spine performed 01/20/2015, and CT of the abdomen and pelvis from 02/06/2008 FINDINGS: The visualized lung bases are clear. A moderate hiatal hernia is noted. The liver is unremarkable in appearance. Numerous calcified granulomata are noted within the spleen. The patient is status post cholecystectomy, with clips noted at the gallbladder fossa. The pancreas and adrenal glands are unremarkable. A 4.9 cm cyst is noted at the upper pole of the left kidney, demonstrating minimal peripheral wall thickening. This has decreased in size from 2009. The kidneys are otherwise grossly unremarkable. There is no evidence of hydronephrosis. No renal or ureteral stones are identified. No perinephric stranding is seen. There is distention of small-bowel loops to 3.0 cm in maximal diameter, aside from more prominent chronic distention of a small bowel suture line at the right lower quadrant. There is gradual decompression, with mild wall thickening along the distal ileum, and surrounding trace fluid and soft tissue inflammation. There is mild fecalization at this region. Findings are concerning for acute infectious or inflammatory ileitis. No focal transition point is seen. The stomach is within normal limits. No acute vascular abnormalities are seen. Postoperative change is noted about the right lower quadrant,  with an ileocolic anastomosis along the ascending colon. The colon is otherwise unremarkable. The bladder is moderately distended and grossly unremarkable. The patient is status post hysterectomy. No suspicious adnexal masses are seen. No inguinal lymphadenopathy is seen. No acute osseous abnormalities are identified. Left convex lumbar scoliosis is noted. Multilevel vacuum phenomenon is noted along the lower thoracic and lumbar spine. IMPRESSION: 1. Gradual decompression of small bowel, with mild wall thickening along the distal ileum, and surrounding trace fluid and soft tissue inflammation. Mild fecalization noted at this region. Findings concerning for acute infectious or inflammatory ileitis. No focal transition point seen to suggest bowel obstruction. 2. Moderate hiatal hernia noted. 3. Large left renal cyst has decreased in size from 2009. Minimal peripheral wall thickening at the cyst is also stable. 4. Left convex lumbar scoliosis noted. Mild degenerative change along the lower thoracic and lumbar spine. Electronically Signed   By: Garald Balding M.D.   On: 06/11/2015 22:24   Dg Abd Acute W/chest  06/11/2015  CLINICAL DATA:  Patient with severe abdominal pain. Nausea and vomiting. EXAM: DG ABDOMEN ACUTE W/ 1V CHEST COMPARISON:  Chest radiograph 01/13/2015 FINDINGS: Patient is rotated to the left. Stable cardiac and mediastinal contours. No consolidative pulmonary opacities. No pleural effusion or pneumothorax. Relative paucity of bowel gas. Postsurgical changes within the right hemi abdomen. No free intraperitoneal air. Leftward curvature of the lumbar spine with associated degenerative changes. IMPRESSION: Paucity of small bowel gas. No acute cardiopulmonary process. Electronically Signed   By: Lovey Newcomer M.D.   On: 06/11/2015 21:14       Assessment & Plan:   Problem List Items Addressed This Visit    Barrett's esophagus    On protonix.  Followed by Dr Tiffany Kocher.        Chronic back pain     Sees Dr Sharlet Salina.  S/p injection this am.        Hemochromatosis    Due f/u with hematology.        Hypercholesteremia    Low cholesterol diet and exercise.  On simvastatin.  Follow lipid panel and liver function tests.        Relevant Medications   losartan (COZAAR) 50 MG tablet   Hypertension    Blood pressure as outlined on current regimen.  Continue same medication for now.  Follow pressures.  Follow metabolic apnel.        Relevant Medications   losartan (COZAAR) 50 MG tablet   Swelling of left lower extremity    Increased swelling left extremity.  Has been more sedentary lately.  Recently in hospital.  Check left lower extremity ultrasound to confirm no DVT.        Relevant Orders   US Venous Img Lower Unilateral Left (Completed)    Other Visit Diagnoses    Screening breast examination    -  Primary    Relevant Orders    MM DIGITAL SCREENING BILATERAL        [name], MD

## 2015-07-16 NOTE — Telephone Encounter (Signed)
Left a message for patient to call me back.

## 2015-07-19 ENCOUNTER — Encounter: Payer: Self-pay | Admitting: Internal Medicine

## 2015-07-19 NOTE — Assessment & Plan Note (Signed)
Low cholesterol diet and exercise.  On simvastatin.  Follow lipid panel and liver function tests.   

## 2015-07-19 NOTE — Assessment & Plan Note (Signed)
On protonix.  Followed by Dr Tiffany Kocher.

## 2015-07-19 NOTE — Assessment & Plan Note (Signed)
Sees Dr Sharlet Salina.  S/p injection this am.

## 2015-07-19 NOTE — Assessment & Plan Note (Signed)
Increased swelling left extremity.  Has been more sedentary lately.  Recently in hospital.  Check left lower extremity ultrasound to confirm no DVT.

## 2015-07-19 NOTE — Assessment & Plan Note (Signed)
Due f/u with hematology.

## 2015-07-19 NOTE — Assessment & Plan Note (Signed)
Blood pressure as outlined on current regimen.  Continue same medication for now.  Follow pressures.  Follow metabolic apnel.

## 2015-07-20 ENCOUNTER — Telehealth: Payer: Self-pay | Admitting: *Deleted

## 2015-07-20 MED ORDER — LOSARTAN POTASSIUM 50 MG PO TABS: 50.0000 mg | ORAL_TABLET | Freq: Every day | ORAL | Status: DC

## 2015-07-20 NOTE — Telephone Encounter (Signed)
Spoke with patient, reviewed results. Thanks

## 2015-07-20 NOTE — Telephone Encounter (Signed)
Patient has requested a medication refill for losartan  Pharmacy [facility] mail order

## 2015-07-20 NOTE — Telephone Encounter (Signed)
Refilled. thanks

## 2015-07-20 NOTE — Addendum Note (Signed)
Addended by: Bevelyn Ngo on: 07/20/2015 12:04 PM   Modules accepted: Orders

## 2015-07-21 ENCOUNTER — Other Ambulatory Visit: Payer: Self-pay | Admitting: *Deleted

## 2015-07-21 ENCOUNTER — Inpatient Hospital Stay: Payer: Commercial Managed Care - HMO

## 2015-07-21 ENCOUNTER — Inpatient Hospital Stay: Payer: Commercial Managed Care - HMO | Attending: Oncology

## 2015-07-21 DIAGNOSIS — D509 Iron deficiency anemia, unspecified: Secondary | ICD-10-CM

## 2015-07-21 LAB — CBC WITH DIFFERENTIAL/PLATELET
BASOS ABS: 0.1 10*3/uL (ref 0–0.1)
BASOS PCT: 1 %
Eosinophils Absolute: 0.1 10*3/uL (ref 0–0.7)
Eosinophils Relative: 1 %
HEMATOCRIT: 36.9 % (ref 35.0–47.0)
HEMOGLOBIN: 12.8 g/dL (ref 12.0–16.0)
LYMPHS PCT: 17 %
Lymphs Abs: 1.4 10*3/uL (ref 1.0–3.6)
MCH: 33.3 pg (ref 26.0–34.0)
MCHC: 34.6 g/dL (ref 32.0–36.0)
MCV: 96.2 fL (ref 80.0–100.0)
MONO ABS: 0.7 10*3/uL (ref 0.2–0.9)
Monocytes Relative: 9 %
NEUTROS ABS: 5.9 10*3/uL (ref 1.4–6.5)
NEUTROS PCT: 72 %
Platelets: 255 10*3/uL (ref 150–440)
RBC: 3.84 MIL/uL (ref 3.80–5.20)
RDW: 13.5 % (ref 11.5–14.5)
WBC: 8.1 10*3/uL (ref 3.6–11.0)

## 2015-07-21 LAB — FERRITIN: FERRITIN: 669 ng/mL — AB (ref 11–307)

## 2015-07-21 LAB — IRON AND TIBC
Iron: 105 ug/dL (ref 28–170)
SATURATION RATIOS: 35 % — AB (ref 10.4–31.8)
TIBC: 300 ug/dL (ref 250–450)
UIBC: 195 ug/dL

## 2015-07-24 ENCOUNTER — Telehealth: Payer: Self-pay | Admitting: Internal Medicine

## 2015-07-24 MED ORDER — LOSARTAN POTASSIUM 50 MG PO TABS: 50.0000 mg | ORAL_TABLET | Freq: Every day | ORAL | Status: DC

## 2015-07-24 NOTE — Telephone Encounter (Signed)
Resent the refill

## 2015-07-24 NOTE — Telephone Encounter (Signed)
Pt states that [employer] has not received her rx for Losartan.Marland Kitchen Please advise

## 2015-07-24 NOTE — Addendum Note (Signed)
Addended by: Westley Hummer B on: 07/24/2015 10:20 AM   Modules accepted: Orders

## 2015-08-19 ENCOUNTER — Inpatient Hospital Stay: Payer: Commercial Managed Care - HMO | Attending: Oncology

## 2015-08-19 ENCOUNTER — Inpatient Hospital Stay: Payer: Commercial Managed Care - HMO

## 2015-08-19 LAB — FERRITIN: FERRITIN: 639 ng/mL — AB (ref 11–307)

## 2015-08-19 NOTE — Progress Notes (Signed)
Pt stuck multiple times today for phlebotomy treatment. Order states draw 331ml off. Able to draw 56ml of blood off after 4th stick. Dr Grayland Ormond notified. Pt to reschedule for later in the week to draw off additional 239ml.

## 2015-08-20 NOTE — Progress Notes (Deleted)
This encounter was created in error - please disregard. This encounter was created in error - please disregard. This encounter was created in error - please disregard. 

## 2015-08-21 ENCOUNTER — Inpatient Hospital Stay: Payer: Commercial Managed Care - HMO

## 2015-09-15 ENCOUNTER — Other Ambulatory Visit: Payer: Self-pay

## 2015-09-15 ENCOUNTER — Inpatient Hospital Stay: Payer: Commercial Managed Care - HMO

## 2015-09-15 ENCOUNTER — Inpatient Hospital Stay: Payer: Commercial Managed Care - HMO | Attending: Oncology

## 2015-09-15 ENCOUNTER — Other Ambulatory Visit: Payer: Self-pay | Admitting: *Deleted

## 2015-09-15 LAB — FERRITIN: Ferritin: 536 ng/mL — ABNORMAL HIGH (ref 11–307)

## 2015-09-28 ENCOUNTER — Ambulatory Visit (INDEPENDENT_AMBULATORY_CARE_PROVIDER_SITE_OTHER): Payer: Commercial Managed Care - HMO | Admitting: Internal Medicine

## 2015-09-28 ENCOUNTER — Encounter: Payer: Self-pay | Admitting: Internal Medicine

## 2015-09-28 VITALS — BP 120/80 | HR 67 | Temp 98.5°F | Resp 18 | Ht 61.0 in | Wt 139.4 lb

## 2015-09-28 DIAGNOSIS — I10 Essential (primary) hypertension: Secondary | ICD-10-CM | POA: Diagnosis not present

## 2015-09-28 DIAGNOSIS — M549 Dorsalgia, unspecified: Secondary | ICD-10-CM

## 2015-09-28 DIAGNOSIS — R600 Localized edema: Secondary | ICD-10-CM

## 2015-09-28 DIAGNOSIS — E7800 Pure hypercholesterolemia, unspecified: Secondary | ICD-10-CM

## 2015-09-28 DIAGNOSIS — G8929 Other chronic pain: Secondary | ICD-10-CM

## 2015-09-28 DIAGNOSIS — K22719 Barrett's esophagus with dysplasia, unspecified: Secondary | ICD-10-CM

## 2015-09-28 DIAGNOSIS — Z85038 Personal history of other malignant neoplasm of large intestine: Secondary | ICD-10-CM

## 2015-09-28 MED ORDER — LOSARTAN POTASSIUM 100 MG PO TABS: 100.0000 mg | ORAL_TABLET | Freq: Every day | ORAL | 1 refills | Status: DC

## 2015-09-28 NOTE — Progress Notes (Signed)
Patient ID: [name], female   DOB: [birth year], 82 y.o.   MRN: [id number]   Subjective:    Patient ID: [name], female    DOB: 1928-11-14, 82 y.o.   MRN: [id number]  HPI  Patient here for a scheduled follow up.  She is accompanied by her husband.  History obtained from both of them.  Being followed by Dr Sharlet Salina for her back pain.  See his last note for details.  Some increased pain recently.  Taking her medication.  Plans to f/u with Dr Sharlet Salina soon.  Also seeing hematology.  S/p phlebotomy.  See notes.  She has lost some weight.  States she is eating.  No nausea or vomiting.  No abdominal pain or cramping.  Bowels stable.  States her blood pressure has been averaging 0000000 systolic readings at home.  No chest pain.  No sob.     Past Medical History:  Diagnosis Date  . Barrett's esophagus   . Bowel obstruction (HCC)    s/p adhesion resection  . Chronic back pain   . Colon cancer ([place]) 1988 and 1989   adenocarcinoma, s/p resection x  2 and chemo  . Diverticulitis   . H/O open leg wound   . Hiatal hernia   . Hypercholesteremia   . Hypertension   . Lumbar scoliosis   . Peripheral neuropathy ([place])   . Recurrent sinus infections   . Renal cyst   . S/P chemotherapy, time since greater than 12 weeks    colon cancer  . Vertigo    Past Surgical History:  Procedure Laterality Date  . ABDOMINAL HYSTERECTOMY  1982  . adhesions resected     bowel obstruction  . APPENDECTOMY    . BREAST BIOPSY Left    negative 06/14/1985  . CHOLECYSTECTOMY    . COLON RESECTION     x2.  s/p colon cancer  . EXCISIONAL HEMORRHOIDECTOMY     with tubal ligation   Family History  Problem Relation Age of Onset  . Breast cancer Mother   . Asthma Mother   . Stroke Father   . Hypertension Father   . Diabetes Father   . Ovarian cancer Sister     x2  . Prostate cancer Brother   . Hypertension Brother     x3  . Hypercholesterolemia Brother     x3  . Diabetes Brother    Social History    Social History  . Marital status: Married    Spouse name: N/A  . Number of children: 4  . Years of education: 12th grade   Occupational History  . homemaker    Social History Main Topics  . Smoking status: Never Smoker  . Smokeless tobacco: Never Used  . Alcohol use No  . Drug use: No  . Sexual activity: No   Other Topics Concern  . None   Social History Narrative  . None    Outpatient Encounter Prescriptions as of 09/28/2015  Medication Sig  . Calcium Carbonate-Vitamin D (CALCIUM 600 + D PO) Take 600 Units by mouth 2 (two) times daily with a meal.   . fexofenadine (ALLEGRA) 60 MG tablet TAKE 1 TABLET EVERY DAY  . fish oil-omega-3 fatty acids 1000 MG capsule Take 2 g by mouth daily.  . meclizine (ANTIVERT) 25 MG tablet Take 25 mg by mouth 3 (three) times daily as needed.  . meloxicam (MOBIC) 7.5 MG tablet Take 7.5 mg by mouth daily.  . metoCLOPramide (REGLAN) 5 MG  tablet Take 5 mg by mouth. Take one tablet three times a day  . metoprolol succinate (TOPROL-XL) 25 MG 24 hr tablet Take 1 tablet (25 mg total) by mouth daily.  . ondansetron (ZOFRAN) 4 MG tablet Take 1 tablet (4 mg total) by mouth 2 (two) times daily as needed for nausea or vomiting.  Marland Kitchen oxyCODONE (OXY IR/ROXICODONE) 5 MG immediate release tablet Take 5 mg by mouth 3 (three) times daily.   . pantoprazole (PROTONIX) 40 MG tablet Take 40 mg by mouth 2 (two) times daily.  . simvastatin (ZOCOR) 10 MG tablet TAKE 1 TABLET AT BEDTIME  . traMADol-acetaminophen (ULTRACET) 37.5-325 MG per tablet Take 1 tablet by mouth 2 (two) times daily as needed.   . [DISCONTINUED] losartan (COZAAR) 50 MG tablet Take 1 tablet (50 mg total) by mouth daily.  Marland Kitchen losartan (COZAAR) 100 MG tablet Take 1 tablet (100 mg total) by mouth daily.  . [DISCONTINUED] estradiol (ESTRACE) 2 MG tablet TAKE 1 TABLET EVERY DAY   No facility-administered encounter medications on file as of 09/28/2015.     Review of Systems  Constitutional: Negative for  appetite change.       Weight is down.    HENT: Negative for congestion and sinus pressure.   Respiratory: Negative for cough, chest tightness and shortness of breath.   Cardiovascular: Negative for chest pain and palpitations.       Wearing compression hose.  Swelling better.    Gastrointestinal: Negative for abdominal pain, diarrhea, nausea and vomiting.  Genitourinary: Negative for difficulty urinating and dysuria.  Musculoskeletal: Positive for back pain. Negative for joint swelling.  Skin: Negative for color change and rash.  Neurological: Negative for dizziness, light-headedness and headaches.  Psychiatric/Behavioral: Negative for agitation and dysphoric mood.       Objective:     Blood pressure rechecked by me:  158/78  Physical Exam  Constitutional: She appears well-developed and well-nourished. No distress.  HENT:  Nose: Nose normal.  Mouth/Throat: Oropharynx is clear and moist.  Neck: Neck supple. No thyromegaly present.  Cardiovascular: Normal rate and regular rhythm.   Pulmonary/Chest: Breath sounds normal. No respiratory distress. She has no wheezes.  Abdominal: Soft. Bowel sounds are normal. There is no tenderness.  Musculoskeletal: She exhibits no edema or tenderness.  Improved with compression hose.   Lymphadenopathy:    She has no cervical adenopathy.  Skin: No rash noted. No erythema.  Psychiatric: She has a normal mood and affect. Her behavior is normal.    BP 120/80   Pulse 67   Temp 98.5 F (36.9 C) (Oral)   Resp 18   Ht 5\' 1"  (1.549 m)   Wt 139 lb 6 oz (63.2 kg)   SpO2 96%   BMI 26.33 kg/m  Wt Readings from Last 3 Encounters:  09/28/15 139 lb 6 oz (63.2 kg)  07/16/15 146 lb 4 oz (66.3 kg)  06/25/15 146 lb 2 oz (66.3 kg)     Lab Results  Component Value Date   WBC 8.1 07/21/2015   HGB 12.8 07/21/2015   HCT 36.9 07/21/2015   PLT 255 07/21/2015   GLUCOSE 105 (H) 06/25/2015   CHOL 170 05/21/2015   TRIG 127.0 05/21/2015   HDL 71.40  05/21/2015   LDLCALC 73 05/21/2015   ALT 17 06/11/2015   AST 23 06/11/2015   NA 139 06/25/2015   K 4.0 06/25/2015   CL 103 06/25/2015   CREATININE 1.14 06/25/2015   BUN 20 06/25/2015   CO2 26  06/25/2015   TSH 2.31 05/21/2015   INR 0.91 06/11/2015    US Venous Img Lower Unilateral Left  Result Date: 07/16/2015 CLINICAL DATA:  82 year old female with a history of leg swelling. EXAM: LEFT LOWER EXTREMITY VENOUS DOPPLER ULTRASOUND TECHNIQUE: Gray-scale sonography with graded compression, as well as color Doppler and duplex ultrasound were performed to evaluate the lower extremity deep venous systems from the level of the common femoral vein and including the common femoral, femoral, profunda femoral, popliteal and calf veins including the posterior tibial, peroneal and gastrocnemius veins when visible. The superficial great saphenous vein was also interrogated. Spectral Doppler was utilized to evaluate flow at rest and with distal augmentation maneuvers in the common femoral, femoral and popliteal veins. COMPARISON:  None. FINDINGS: Contralateral Common Femoral Vein: Respiratory phasicity is normal and symmetric with the symptomatic side. No evidence of thrombus. Normal compressibility. Common Femoral Vein: No evidence of thrombus. Normal compressibility, respiratory phasicity and response to augmentation. Saphenofemoral Junction: No evidence of thrombus. Normal compressibility and flow on color Doppler imaging. Profunda Femoral Vein: No evidence of thrombus. Normal compressibility and flow on color Doppler imaging. Femoral Vein: No evidence of thrombus. Normal compressibility, respiratory phasicity and response to augmentation. Popliteal Vein: No evidence of thrombus. Normal compressibility, respiratory phasicity and response to augmentation. Calf Veins: No evidence of thrombus. Normal compressibility and flow on color Doppler imaging. Superficial Great Saphenous Vein: No evidence of thrombus. Normal  compressibility and flow on color Doppler imaging. Other Findings:  Edema IMPRESSION: Sonographic survey of the left lower extremity negative for DVT. Edema of the left lower extremity. Signed, Dulcy Fanny. Earleen Newport, DO [facility] Electronically Signed   By: Corrie Mckusick D.O.   On: 07/16/2015 13:14       Assessment & Plan:   Problem List Items Addressed This Visit    Barrett's esophagus    On protonix.  Followed by Dr Tiffany Kocher.       Bilateral lower extremity edema    Much improved with compression hose.  Follow.       Chronic back pain    Sees Dr Sharlet Salina.  Has f/u planned soon.        Hemochromatosis    Followed by hematology.        History of colon cancer    Colonoscopy 07/30/10 - internal hemorrhoids.  Followed by GI.       Hypercholesteremia - Primary    On simvastatin.  Low cholesterol diet and exercise.  Follow lipid panel and liver function tests.        Relevant Medications   losartan (COZAAR) 100 MG tablet   Other Relevant Orders   Lipid panel   Hepatic function panel   Hypertension    Persistent elevation.  Increase losartan to 100mg  q day.  Follow pressures.  Get her back in soon to reassess.        Relevant Medications   losartan (COZAAR) 100 MG tablet   Other Relevant Orders   Basic metabolic panel    Other Visit Diagnoses   None.      [name], MD

## 2015-09-28 NOTE — Progress Notes (Signed)
Pre-visit discussion using our clinic review tool. No additional management support is needed unless otherwise documented below in the visit note.  

## 2015-09-28 NOTE — Patient Instructions (Signed)
Change losartan to 100mg  per day.

## 2015-09-29 ENCOUNTER — Encounter: Payer: Self-pay | Admitting: Internal Medicine

## 2015-09-29 NOTE — Assessment & Plan Note (Signed)
On protonix.  Followed by Dr Tiffany Kocher.

## 2015-09-29 NOTE — Assessment & Plan Note (Signed)
Sees Dr Sharlet Salina.  Has f/u planned soon.

## 2015-09-29 NOTE — Assessment & Plan Note (Signed)
Persistent elevation.  Increase losartan to 100mg  q day.  Follow pressures.  Get her back in soon to reassess.

## 2015-09-29 NOTE — Assessment & Plan Note (Signed)
On simvastatin.  Low cholesterol diet and exercise.  Follow lipid panel and liver function tests.   

## 2015-09-29 NOTE — Assessment & Plan Note (Signed)
Much improved with compression hose.  Follow.

## 2015-09-29 NOTE — Assessment & Plan Note (Signed)
Followed by hematology 

## 2015-09-29 NOTE — Assessment & Plan Note (Signed)
Colonoscopy 07/30/10 - internal hemorrhoids.  Followed by GI.

## 2015-10-07 DIAGNOSIS — M5416 Radiculopathy, lumbar region: Secondary | ICD-10-CM | POA: Diagnosis not present

## 2015-10-07 DIAGNOSIS — M7062 Trochanteric bursitis, left hip: Secondary | ICD-10-CM | POA: Diagnosis not present

## 2015-10-07 DIAGNOSIS — M7061 Trochanteric bursitis, right hip: Secondary | ICD-10-CM | POA: Diagnosis not present

## 2015-10-07 DIAGNOSIS — M5136 Other intervertebral disc degeneration, lumbar region: Secondary | ICD-10-CM | POA: Diagnosis not present

## 2015-10-13 ENCOUNTER — Inpatient Hospital Stay: Payer: Commercial Managed Care - HMO

## 2015-10-13 ENCOUNTER — Other Ambulatory Visit (INDEPENDENT_AMBULATORY_CARE_PROVIDER_SITE_OTHER): Payer: Commercial Managed Care - HMO

## 2015-10-13 DIAGNOSIS — E7800 Pure hypercholesterolemia, unspecified: Secondary | ICD-10-CM

## 2015-10-13 DIAGNOSIS — I10 Essential (primary) hypertension: Secondary | ICD-10-CM

## 2015-10-13 LAB — HEPATIC FUNCTION PANEL
ALBUMIN: 4.1 g/dL (ref 3.5–5.2)
ALK PHOS: 37 U/L — AB (ref 39–117)
ALT: 23 U/L (ref 0–35)
AST: 20 U/L (ref 0–37)
Bilirubin, Direct: 0.2 mg/dL (ref 0.0–0.3)
Total Bilirubin: 1.1 mg/dL (ref 0.2–1.2)
Total Protein: 6.7 g/dL (ref 6.0–8.3)

## 2015-10-13 LAB — BASIC METABOLIC PANEL
BUN: 24 mg/dL — ABNORMAL HIGH (ref 6–23)
CALCIUM: 9.5 mg/dL (ref 8.4–10.5)
CO2: 31 meq/L (ref 19–32)
CREATININE: 0.95 mg/dL (ref 0.40–1.20)
Chloride: 102 mEq/L (ref 96–112)
GFR: 59.47 mL/min — AB (ref >60.00)
Glucose, Bld: 93 mg/dL (ref 70–99)
Potassium: 4.4 mEq/L (ref 3.5–5.1)
Sodium: 139 mEq/L (ref 135–145)

## 2015-10-13 LAB — LIPID PANEL
Cholesterol: 162 mg/dL (ref 0–200)
HDL: 77.2 mg/dL (ref >39.00)
LDL Cholesterol: 63 mg/dL (ref 0–99)
NonHDL: 85.08
TRIGLYCERIDES: 112 mg/dL (ref 0.0–149.0)
Total CHOL/HDL Ratio: 2
VLDL: 22.4 mg/dL (ref 0.0–40.0)

## 2015-10-13 LAB — FERRITIN: FERRITIN: 579 ng/mL — AB (ref 11–307)

## 2015-10-13 NOTE — Progress Notes (Signed)
Patient requested to reschedule phlebotomy.  Ferritin results not back after 1.5 hours.  Patient will be here Thursday if phlebotomy needed.

## 2015-10-14 ENCOUNTER — Telehealth: Payer: Self-pay | Admitting: Internal Medicine

## 2015-10-14 DIAGNOSIS — M5136 Other intervertebral disc degeneration, lumbar region: Secondary | ICD-10-CM | POA: Diagnosis not present

## 2015-10-14 DIAGNOSIS — M5416 Radiculopathy, lumbar region: Secondary | ICD-10-CM | POA: Diagnosis not present

## 2015-10-14 DIAGNOSIS — M7062 Trochanteric bursitis, left hip: Secondary | ICD-10-CM | POA: Diagnosis not present

## 2015-10-14 DIAGNOSIS — M7061 Trochanteric bursitis, right hip: Secondary | ICD-10-CM | POA: Diagnosis not present

## 2015-10-14 NOTE — Telephone Encounter (Signed)
Spoke with the patient and reviewed her labs.  Please document in results that she verbalized understanding, thanks

## 2015-10-14 NOTE — Telephone Encounter (Signed)
Pt called back regarding lab results from yesterday. Thank you!

## 2015-10-15 ENCOUNTER — Inpatient Hospital Stay: Payer: Commercial Managed Care - HMO

## 2015-10-15 NOTE — Telephone Encounter (Signed)
I documented, and closed, thanks

## 2015-10-20 DIAGNOSIS — M5416 Radiculopathy, lumbar region: Secondary | ICD-10-CM | POA: Diagnosis not present

## 2015-10-20 DIAGNOSIS — M5136 Other intervertebral disc degeneration, lumbar region: Secondary | ICD-10-CM | POA: Diagnosis not present

## 2015-10-20 DIAGNOSIS — M7061 Trochanteric bursitis, right hip: Secondary | ICD-10-CM | POA: Diagnosis not present

## 2015-10-20 DIAGNOSIS — M7062 Trochanteric bursitis, left hip: Secondary | ICD-10-CM | POA: Diagnosis not present

## 2015-10-22 DIAGNOSIS — M5136 Other intervertebral disc degeneration, lumbar region: Secondary | ICD-10-CM | POA: Diagnosis not present

## 2015-10-22 DIAGNOSIS — M7061 Trochanteric bursitis, right hip: Secondary | ICD-10-CM | POA: Diagnosis not present

## 2015-10-22 DIAGNOSIS — M7062 Trochanteric bursitis, left hip: Secondary | ICD-10-CM | POA: Diagnosis not present

## 2015-10-22 DIAGNOSIS — M5416 Radiculopathy, lumbar region: Secondary | ICD-10-CM | POA: Diagnosis not present

## 2015-10-26 DIAGNOSIS — M7062 Trochanteric bursitis, left hip: Secondary | ICD-10-CM | POA: Diagnosis not present

## 2015-10-26 DIAGNOSIS — M5136 Other intervertebral disc degeneration, lumbar region: Secondary | ICD-10-CM | POA: Diagnosis not present

## 2015-10-26 DIAGNOSIS — M7061 Trochanteric bursitis, right hip: Secondary | ICD-10-CM | POA: Diagnosis not present

## 2015-10-26 DIAGNOSIS — M5416 Radiculopathy, lumbar region: Secondary | ICD-10-CM | POA: Diagnosis not present

## 2015-10-28 DIAGNOSIS — M7061 Trochanteric bursitis, right hip: Secondary | ICD-10-CM | POA: Diagnosis not present

## 2015-10-28 DIAGNOSIS — M7062 Trochanteric bursitis, left hip: Secondary | ICD-10-CM | POA: Diagnosis not present

## 2015-10-28 DIAGNOSIS — M5416 Radiculopathy, lumbar region: Secondary | ICD-10-CM | POA: Diagnosis not present

## 2015-10-28 DIAGNOSIS — M5136 Other intervertebral disc degeneration, lumbar region: Secondary | ICD-10-CM | POA: Diagnosis not present

## 2015-10-29 ENCOUNTER — Ambulatory Visit: Payer: Commercial Managed Care - HMO

## 2015-11-02 DIAGNOSIS — M7061 Trochanteric bursitis, right hip: Secondary | ICD-10-CM | POA: Diagnosis not present

## 2015-11-02 DIAGNOSIS — M5136 Other intervertebral disc degeneration, lumbar region: Secondary | ICD-10-CM | POA: Diagnosis not present

## 2015-11-02 DIAGNOSIS — M7062 Trochanteric bursitis, left hip: Secondary | ICD-10-CM | POA: Diagnosis not present

## 2015-11-02 DIAGNOSIS — M5416 Radiculopathy, lumbar region: Secondary | ICD-10-CM | POA: Diagnosis not present

## 2015-11-03 ENCOUNTER — Other Ambulatory Visit: Payer: Self-pay | Admitting: Internal Medicine

## 2015-11-03 ENCOUNTER — Ambulatory Visit
Admission: RE | Admit: 2015-11-03 | Discharge: 2015-11-03 | Disposition: A | Discharge status: Home / Self Care | Payer: Commercial Managed Care - HMO | Source: Ambulatory Visit | Attending: Internal Medicine | Admitting: Internal Medicine

## 2015-11-03 DIAGNOSIS — Z1231 Encounter for screening mammogram for malignant neoplasm of breast: Secondary | ICD-10-CM | POA: Insufficient documentation

## 2015-11-03 DIAGNOSIS — Z1239 Encounter for other screening for malignant neoplasm of breast: Secondary | ICD-10-CM

## 2015-11-04 DIAGNOSIS — M5416 Radiculopathy, lumbar region: Secondary | ICD-10-CM | POA: Diagnosis not present

## 2015-11-04 DIAGNOSIS — M7062 Trochanteric bursitis, left hip: Secondary | ICD-10-CM | POA: Diagnosis not present

## 2015-11-04 DIAGNOSIS — M7061 Trochanteric bursitis, right hip: Secondary | ICD-10-CM | POA: Diagnosis not present

## 2015-11-04 DIAGNOSIS — M5136 Other intervertebral disc degeneration, lumbar region: Secondary | ICD-10-CM | POA: Diagnosis not present

## 2015-11-09 ENCOUNTER — Other Ambulatory Visit: Payer: Self-pay | Admitting: *Deleted

## 2015-11-09 ENCOUNTER — Other Ambulatory Visit: Payer: Self-pay | Admitting: Internal Medicine

## 2015-11-09 DIAGNOSIS — M5136 Other intervertebral disc degeneration, lumbar region: Secondary | ICD-10-CM | POA: Diagnosis not present

## 2015-11-09 DIAGNOSIS — M7061 Trochanteric bursitis, right hip: Secondary | ICD-10-CM | POA: Diagnosis not present

## 2015-11-09 DIAGNOSIS — M7062 Trochanteric bursitis, left hip: Secondary | ICD-10-CM | POA: Diagnosis not present

## 2015-11-09 DIAGNOSIS — M5416 Radiculopathy, lumbar region: Secondary | ICD-10-CM | POA: Diagnosis not present

## 2015-11-09 NOTE — Progress Notes (Signed)
[place]  Telephone:([phone]) [phone] Fax:(336) [phone]  ID: [name] OB: 1928/05/08  MR#: [id number]  [id number]  Patient Care Team: [name], MD as PCP - General (Internal Medicine)  CHIEF COMPLAINT: Hemochromatosis, homozygous C282Y mutation  INTERVAL HISTORY: Patient returns to clinic today for repeat laboratory work and further evaluation. She is tolerating monthly phlebotomy without significant side effects. She currently feels well and is asymptomatic. She has no neurologic complaints. She denies any recent fevers or illnesses. She has a good appetite and denies weight loss. She denies any chest pain or shortness of breath. She denies any nausea, vomiting, constipation, or diarrhea. She has no melena or hematochezia. She has no urinary complaints. Patient  offers no specific complaints today.  REVIEW OF SYSTEMS:   Review of Systems  Constitutional: Negative for fever, malaise/fatigue and weight loss.  Respiratory: Negative.  Negative for shortness of breath.   Cardiovascular: Negative.  Negative for chest pain.  Gastrointestinal: Negative.  Negative for blood in stool and melena.  Genitourinary: Negative.   Musculoskeletal: Negative.   Neurological: Negative.  Negative for weakness.  Psychiatric/Behavioral: Negative.     As per HPI. Otherwise, a complete review of systems is negative.  PAST MEDICAL HISTORY: Past Medical History:  Diagnosis Date  . Barrett's esophagus   . Bowel obstruction (HCC)    s/p adhesion resection  . Chronic back pain   . Colon cancer ([place]) 1988 and 1989   adenocarcinoma, s/p resection x  2 and chemo  . Diverticulitis   . H/O open leg wound   . Hiatal hernia   . Hypercholesteremia   . Hypertension   . Lumbar scoliosis   . Peripheral neuropathy ([place])   . Recurrent sinus infections   . Renal cyst   . S/P chemotherapy, time since greater than 12 weeks    colon cancer  . Vertigo     PAST SURGICAL  HISTORY: Past Surgical History:  Procedure Laterality Date  . ABDOMINAL HYSTERECTOMY  1982  . adhesions resected     bowel obstruction  . APPENDECTOMY    . BREAST BIOPSY Left    negative 06/14/1985  . CHOLECYSTECTOMY    . COLON RESECTION     x2.  s/p colon cancer  . EXCISIONAL HEMORRHOIDECTOMY     with tubal ligation    FAMILY HISTORY Family History  Problem Relation Age of Onset  . Breast cancer Mother   . Asthma Mother   . Stroke Father   . Hypertension Father   . Diabetes Father   . Ovarian cancer Sister     x2  . Prostate cancer Brother   . Hypertension Brother     x3  . Hypercholesterolemia Brother     x3  . Diabetes Brother        ADVANCED DIRECTIVES:    HEALTH MAINTENANCE: Social History  Substance Use Topics  . Smoking status: Never Smoker  . Smokeless tobacco: Never Used  . Alcohol use No     Colonoscopy:  PAP:  Bone density:  Lipid panel:  No Known Allergies  Current Outpatient Prescriptions  Medication Sig Dispense Refill  . Calcium Carbonate-Vitamin D (CALCIUM 600 + D PO) Take 600 Units by mouth 2 (two) times daily with a meal.     . fexofenadine (ALLEGRA) 60 MG tablet TAKE 1 TABLET EVERY DAY 90 tablet 3  . fish oil-omega-3 fatty acids 1000 MG capsule Take 2 g by mouth daily.    Marland Kitchen losartan (COZAAR) 100 MG  tablet Take 1 tablet (100 mg total) by mouth daily. 90 tablet 1  . meclizine (ANTIVERT) 25 MG tablet Take 25 mg by mouth 3 (three) times daily as needed.    . meloxicam (MOBIC) 7.5 MG tablet Take 7.5 mg by mouth daily.    . metoprolol succinate (TOPROL-XL) 25 MG 24 hr tablet Take 1 tablet (25 mg total) by mouth daily. 90 tablet 3  . ondansetron (ZOFRAN) 4 MG tablet Take 1 tablet (4 mg total) by mouth 2 (two) times daily as needed for nausea or vomiting. 30 tablet 0  . oxyCODONE (OXY IR/ROXICODONE) 5 MG immediate release tablet Take 5 mg by mouth 3 (three) times daily.     . pantoprazole (PROTONIX) 40 MG tablet Take 40 mg by mouth 2 (two)  times daily.    . simvastatin (ZOCOR) 10 MG tablet TAKE 1 TABLET AT BEDTIME 90 tablet 3  . traMADol-acetaminophen (ULTRACET) 37.5-325 MG per tablet Take 1 tablet by mouth 2 (two) times daily as needed.      No current facility-administered medications for this visit.     OBJECTIVE: Vitals:   11/10/15 1423  BP: (!) 184/78  Pulse: 64  Resp: 18  Temp: (!) 96.6 F (35.9 C)     Body mass index is 26.85 kg/m.    ECOG FS:0 - Asymptomatic  General: Well-developed, well-nourished, no acute distress. Eyes: Pink conjunctiva, anicteric sclera. Lungs: Clear to auscultation bilaterally. Heart: Regular rate and rhythm. No rubs, murmurs, or gallops. Abdomen: Soft, nontender, nondistended. No organomegaly noted, normoactive bowel sounds. Musculoskeletal: No edema, cyanosis, or clubbing. Neuro: Alert, answering all questions appropriately. Cranial nerves grossly intact. Skin: No rashes or petechiae noted. Psych: Normal affect.   LAB RESULTS:  Lab Results  Component Value Date   NA 139 10/13/2015   K 4.4 10/13/2015   CL 102 10/13/2015   CO2 31 10/13/2015   GLUCOSE 93 10/13/2015   BUN 24 (H) 10/13/2015   CREATININE 0.95 10/13/2015   CALCIUM 9.5 10/13/2015   PROT 6.7 10/13/2015   ALBUMIN 4.1 10/13/2015   AST 20 10/13/2015   ALT 23 10/13/2015   ALKPHOS 37 (L) 10/13/2015   BILITOT 1.1 10/13/2015   GFRNONAA 52 (L) 06/13/2015   GFRAA 60 (L) 06/13/2015    Lab Results  Component Value Date   WBC 5.8 11/10/2015   NEUTROABS 3.7 11/10/2015   HGB 12.5 11/10/2015   HCT 36.4 11/10/2015   MCV 94.9 11/10/2015   PLT 275 11/10/2015   Lab Results  Component Value Date   IRON 83 11/10/2015   TIBC 314 11/10/2015   IRONPCTSAT 26 11/10/2015    Lab Results  Component Value Date   FERRITIN 442 (H) 11/10/2015     STUDIES: Mm Screening Breast Tomo Bilateral  Result Date: 11/03/2015 CLINICAL DATA:  Screening. EXAM: 2D DIGITAL SCREENING BILATERAL MAMMOGRAM WITH CAD AND ADJUNCT TOMO  COMPARISON:  Previous exam(s). ACR Breast Density Category b: There are scattered areas of fibroglandular density. FINDINGS: There are no findings suspicious for malignancy. Images were processed with CAD. IMPRESSION: No mammographic evidence of malignancy. A result letter of this screening mammogram will be mailed directly to the patient. RECOMMENDATION: Screening mammogram in one year. (Code:SM-B-01Y) BI-RADS CATEGORY  1: Negative. Electronically Signed   By: Margarette Canada M.D.   On: 11/03/2015 13:59    ASSESSMENT: Hemochromatosis, homozygous C282Y mutation.  PLAN:    1. Hemachromatosis: Patient ferritin level remains elevated, but significantly improved since initiating monthly phlebotomy. Proceed with 300 mL phlebotomy  today and return to clinic every 4 weeks for consideration of phlebotomy. Goal ferritin will be less than 100. Return to clinic in 4 months for repeat laboratory work and further evaluation. 2. Hypertension: Patient's blood pressure is significantly elevated today. Continue current medications. Treatment and evaluation per PCP.  Approximately 30 minutes was spent in discussion of which greater than 50% was consultation.  Patient expressed understanding and was in agreement with this plan. She also understands that She can call clinic at any time with any questions, concerns, or complaints.    Lloyd Huger, MD   11/13/2015 3:51 PM

## 2015-11-10 ENCOUNTER — Inpatient Hospital Stay: Payer: Commercial Managed Care - HMO | Attending: Oncology

## 2015-11-10 ENCOUNTER — Inpatient Hospital Stay (HOSPITAL_BASED_OUTPATIENT_CLINIC_OR_DEPARTMENT_OTHER): Payer: Commercial Managed Care - HMO | Admitting: Oncology

## 2015-11-10 ENCOUNTER — Inpatient Hospital Stay: Payer: Commercial Managed Care - HMO

## 2015-11-10 ENCOUNTER — Other Ambulatory Visit: Payer: Self-pay | Admitting: *Deleted

## 2015-11-10 DIAGNOSIS — K2270 Barrett's esophagus without dysplasia: Secondary | ICD-10-CM | POA: Insufficient documentation

## 2015-11-10 DIAGNOSIS — Z8042 Family history of malignant neoplasm of prostate: Secondary | ICD-10-CM | POA: Diagnosis not present

## 2015-11-10 DIAGNOSIS — Z803 Family history of malignant neoplasm of breast: Secondary | ICD-10-CM | POA: Insufficient documentation

## 2015-11-10 DIAGNOSIS — Z85038 Personal history of other malignant neoplasm of large intestine: Secondary | ICD-10-CM | POA: Diagnosis not present

## 2015-11-10 DIAGNOSIS — E7800 Pure hypercholesterolemia, unspecified: Secondary | ICD-10-CM | POA: Insufficient documentation

## 2015-11-10 DIAGNOSIS — Z8041 Family history of malignant neoplasm of ovary: Secondary | ICD-10-CM | POA: Insufficient documentation

## 2015-11-10 DIAGNOSIS — I10 Essential (primary) hypertension: Secondary | ICD-10-CM | POA: Insufficient documentation

## 2015-11-10 DIAGNOSIS — G8929 Other chronic pain: Secondary | ICD-10-CM | POA: Diagnosis not present

## 2015-11-10 DIAGNOSIS — M549 Dorsalgia, unspecified: Secondary | ICD-10-CM | POA: Insufficient documentation

## 2015-11-10 DIAGNOSIS — Z9221 Personal history of antineoplastic chemotherapy: Secondary | ICD-10-CM | POA: Insufficient documentation

## 2015-11-10 DIAGNOSIS — Z79899 Other long term (current) drug therapy: Secondary | ICD-10-CM | POA: Diagnosis not present

## 2015-11-10 LAB — CBC WITH DIFFERENTIAL/PLATELET
Basophils Absolute: 0.1 10*3/uL (ref 0–0.1)
Basophils Relative: 1 %
EOS ABS: 0.2 10*3/uL (ref 0–0.7)
Eosinophils Relative: 3 %
HEMATOCRIT: 36.4 % (ref 35.0–47.0)
HEMOGLOBIN: 12.5 g/dL (ref 12.0–16.0)
LYMPHS ABS: 1.3 10*3/uL (ref 1.0–3.6)
Lymphocytes Relative: 23 %
MCH: 32.6 pg (ref 26.0–34.0)
MCHC: 34.4 g/dL (ref 32.0–36.0)
MCV: 94.9 fL (ref 80.0–100.0)
MONO ABS: 0.5 10*3/uL (ref 0.2–0.9)
MONOS PCT: 9 %
NEUTROS ABS: 3.7 10*3/uL (ref 1.4–6.5)
NEUTROS PCT: 64 %
Platelets: 275 10*3/uL (ref 150–440)
RBC: 3.84 MIL/uL (ref 3.80–5.20)
RDW: 13.6 % (ref 11.5–14.5)
WBC: 5.8 10*3/uL (ref 3.6–11.0)

## 2015-11-10 LAB — IRON AND TIBC
Iron: 83 ug/dL (ref 28–170)
Saturation Ratios: 26 % (ref 10.4–31.8)
TIBC: 314 ug/dL (ref 250–450)
UIBC: 231 ug/dL

## 2015-11-10 LAB — FERRITIN: FERRITIN: 442 ng/mL — AB (ref 11–307)

## 2015-11-10 NOTE — Progress Notes (Signed)
States is feeling well. Offers no complaints. 

## 2015-11-10 NOTE — Progress Notes (Signed)
Unable to do phlebotomy today. Multiple sticks unsuccessful. Pt rescheduled for 9/27. Encouraged pt to force fluids between today and tomorrow.

## 2015-11-11 ENCOUNTER — Inpatient Hospital Stay: Payer: Commercial Managed Care - HMO

## 2015-11-11 DIAGNOSIS — M5136 Other intervertebral disc degeneration, lumbar region: Secondary | ICD-10-CM | POA: Diagnosis not present

## 2015-11-11 DIAGNOSIS — M7061 Trochanteric bursitis, right hip: Secondary | ICD-10-CM | POA: Diagnosis not present

## 2015-11-11 DIAGNOSIS — M7062 Trochanteric bursitis, left hip: Secondary | ICD-10-CM | POA: Diagnosis not present

## 2015-11-11 DIAGNOSIS — M5416 Radiculopathy, lumbar region: Secondary | ICD-10-CM | POA: Diagnosis not present

## 2015-11-14 NOTE — Progress Notes (Signed)
Reviewed note.  Please call pt and see if she is checking her pressures.  Her blood pressure at the cancer center was elevated.  Let me know how her blood pressure is running on outside checks.  Thanks

## 2015-11-16 NOTE — Progress Notes (Signed)
lmtrc

## 2015-11-16 NOTE — Progress Notes (Signed)
Patient has not checked her blood pressure since but has an appointment to see you tomorrow at 12

## 2015-11-17 ENCOUNTER — Ambulatory Visit (INDEPENDENT_AMBULATORY_CARE_PROVIDER_SITE_OTHER): Payer: Commercial Managed Care - HMO | Admitting: Internal Medicine

## 2015-11-17 ENCOUNTER — Encounter: Payer: Self-pay | Admitting: Internal Medicine

## 2015-11-17 DIAGNOSIS — Z85038 Personal history of other malignant neoplasm of large intestine: Secondary | ICD-10-CM | POA: Diagnosis not present

## 2015-11-17 DIAGNOSIS — M545 Low back pain: Secondary | ICD-10-CM

## 2015-11-17 DIAGNOSIS — I10 Essential (primary) hypertension: Secondary | ICD-10-CM | POA: Diagnosis not present

## 2015-11-17 DIAGNOSIS — Z23 Encounter for immunization: Secondary | ICD-10-CM | POA: Diagnosis not present

## 2015-11-17 DIAGNOSIS — E7800 Pure hypercholesterolemia, unspecified: Secondary | ICD-10-CM

## 2015-11-17 DIAGNOSIS — K22719 Barrett's esophagus with dysplasia, unspecified: Secondary | ICD-10-CM

## 2015-11-17 DIAGNOSIS — G8929 Other chronic pain: Secondary | ICD-10-CM

## 2015-11-17 MED ORDER — AMLODIPINE BESYLATE 5 MG PO TABS: 5.0000 mg | ORAL_TABLET | Freq: Every day | ORAL | 1 refills | Status: DC

## 2015-11-17 NOTE — Progress Notes (Signed)
Pre visit review using our clinic review tool, if applicable. No additional management support is needed unless otherwise documented below in the visit note. 

## 2015-11-17 NOTE — Progress Notes (Signed)
Patient ID: [name], female   DOB: 1928/04/17, 82 y.o.   MRN: [id number]   Subjective:    Patient ID: [name], female    DOB: Mar 19, 1928, 82 y.o.   MRN: [id number]  HPI  Patient here for a scheduled follow up.  She is seeing Dr Grayland Ormond for hemochromatosis.  See his note.  S/p phlebotomy.  She is accompanied by her husband.  History obtained from both of them.  She reports she is doing well.  No abdominal pain.  No cramping.  Bowels moving.  Breathing stable.  Eating and drinking well.  No nausea or vomiting.     Past Medical History:  Diagnosis Date  . Barrett's esophagus   . Bowel obstruction    s/p adhesion resection  . Chronic back pain   . Colon cancer ([place]) 1988 and 1989   adenocarcinoma, s/p resection x  2 and chemo  . Diverticulitis   . H/O open leg wound   . Hiatal hernia   . Hypercholesteremia   . Hypertension   . Lumbar scoliosis   . Peripheral neuropathy ([place])   . Recurrent sinus infections   . Renal cyst   . S/P chemotherapy, time since greater than 12 weeks    colon cancer  . Vertigo    Past Surgical History:  Procedure Laterality Date  . ABDOMINAL HYSTERECTOMY  1982  . adhesions resected     bowel obstruction  . APPENDECTOMY    . BREAST BIOPSY Left    negative 06/14/1985  . CHOLECYSTECTOMY    . COLON RESECTION     x2.  s/p colon cancer  . EXCISIONAL HEMORRHOIDECTOMY     with tubal ligation   Family History  Problem Relation Age of Onset  . Breast cancer Mother   . Asthma Mother   . Stroke Father   . Hypertension Father   . Diabetes Father   . Ovarian cancer Sister     x2  . Prostate cancer Brother   . Hypertension Brother     x3  . Hypercholesterolemia Brother     x3  . Diabetes Brother    Social History   Social History  . Marital status: Married    Spouse name: N/A  . Number of children: 4  . Years of education: 12th grade   Occupational History  . homemaker    Social History Main Topics  . Smoking status: Never  Smoker  . Smokeless tobacco: Never Used  . Alcohol use No  . Drug use: No  . Sexual activity: No   Other Topics Concern  . None   Social History Narrative  . None    Outpatient Encounter Prescriptions as of 11/17/2015  Medication Sig  . Calcium Carbonate-Vitamin D (CALCIUM 600 + D PO) Take 600 Units by mouth 2 (two) times daily with a meal.   . fexofenadine (ALLEGRA) 60 MG tablet TAKE 1 TABLET EVERY DAY  . fish oil-omega-3 fatty acids 1000 MG capsule Take 2 g by mouth daily.  Marland Kitchen losartan (COZAAR) 100 MG tablet Take 1 tablet (100 mg total) by mouth daily.  . meclizine (ANTIVERT) 25 MG tablet Take 25 mg by mouth 3 (three) times daily as needed.  . meloxicam (MOBIC) 7.5 MG tablet Take 7.5 mg by mouth daily.  . metoprolol succinate (TOPROL-XL) 25 MG 24 hr tablet Take 1 tablet (25 mg total) by mouth daily.  . ondansetron (ZOFRAN) 4 MG tablet Take 1 tablet (4 mg total) by mouth 2 (  two) times daily as needed for nausea or vomiting.  Marland Kitchen oxyCODONE (OXY IR/ROXICODONE) 5 MG immediate release tablet Take 5 mg by mouth 3 (three) times daily.   . pantoprazole (PROTONIX) 40 MG tablet Take 40 mg by mouth 2 (two) times daily.  . simvastatin (ZOCOR) 10 MG tablet TAKE 1 TABLET AT BEDTIME  . traMADol-acetaminophen (ULTRACET) 37.5-325 MG per tablet Take 1 tablet by mouth 2 (two) times daily as needed.   Marland Kitchen amLODipine (NORVASC) 5 MG tablet Take 1 tablet (5 mg total) by mouth daily.   No facility-administered encounter medications on file as of 11/17/2015.     Review of Systems  Constitutional: Negative for appetite change and unexpected weight change.  HENT: Negative for congestion and sinus pressure.   Respiratory: Negative for cough, chest tightness and shortness of breath.   Cardiovascular: Negative for chest pain, palpitations and leg swelling.  Gastrointestinal: Negative for abdominal pain, diarrhea, nausea and vomiting.  Genitourinary: Negative for difficulty urinating and dysuria.    Musculoskeletal: Positive for back pain. Negative for joint swelling.  Skin: Negative for color change and rash.  Neurological: Negative for dizziness, light-headedness and headaches.  Psychiatric/Behavioral: Negative for agitation and dysphoric mood.       Objective:     Blood pressure rechecked by me:  154-156/78  Physical Exam  Constitutional: She appears well-developed and well-nourished. No distress.  HENT:  Nose: Nose normal.  Mouth/Throat: Oropharynx is clear and moist.  Neck: Neck supple. No thyromegaly present.  Cardiovascular: Normal rate and regular rhythm.   Pulmonary/Chest: Breath sounds normal. No respiratory distress. She has no wheezes.  Abdominal: Soft. Bowel sounds are normal. There is no tenderness.  Musculoskeletal: She exhibits no edema or tenderness.  Lymphadenopathy:    She has no cervical adenopathy.  Skin: No rash noted. No erythema.  Psychiatric: She has a normal mood and affect. Her behavior is normal.    BP (!) 160/80   Pulse 66   Temp 98.1 F (36.7 C) (Oral)   Ht 5\' 1"  (1.549 m)   Wt 141 lb 9.6 oz (64.2 kg)   SpO2 97%   BMI 26.76 kg/m  Wt Readings from Last 3 Encounters:  11/28/15 155 lb 9.6 oz (70.6 kg)  11/17/15 141 lb 9.6 oz (64.2 kg)  11/10/15 142 lb 1.4 oz (64.4 kg)     Lab Results  Component Value Date   WBC 9.3 11/25/2015   HGB 13.3 11/25/2015   HCT 38.5 11/25/2015   PLT 199 11/25/2015   GLUCOSE 135 (H) 11/25/2015   CHOL 162 10/13/2015   TRIG 112.0 10/13/2015   HDL 77.20 10/13/2015   LDLCALC 63 10/13/2015   ALT 20 11/24/2015   AST 28 11/24/2015   NA 139 11/25/2015   K 3.3 (L) 11/25/2015   CL 110 11/25/2015   CREATININE 0.70 11/25/2015   BUN 12 11/25/2015   CO2 20 (L) 11/25/2015   TSH 2.31 05/21/2015   INR 0.91 06/11/2015    Mm Screening Breast Tomo Bilateral  Result Date: 11/03/2015 CLINICAL DATA:  Screening. EXAM: 2D DIGITAL SCREENING BILATERAL MAMMOGRAM WITH CAD AND ADJUNCT TOMO COMPARISON:  Previous exam(s).  ACR Breast Density Category b: There are scattered areas of fibroglandular density. FINDINGS: There are no findings suspicious for malignancy. Images were processed with CAD. IMPRESSION: No mammographic evidence of malignancy. A result letter of this screening mammogram will be mailed directly to the patient. RECOMMENDATION: Screening mammogram in one year. (Code:SM-B-01Y) BI-RADS CATEGORY  1: Negative. Electronically Signed  By: Margarette Canada M.D.   On: 11/03/2015 13:59       Assessment & Plan:   Problem List Items Addressed This Visit    Barrett's esophagus    On protonix.  Followed by GI.        Chronic back pain    Seeing Dr Sharlet Salina.  Stable.       Hemochromatosis    Followed by hematology.        History of colon cancer    Colonoscopy 07/30/10 -  Internal hemorrhoids.  Followed by GI.       Hypercholesteremia    Low cholesterol diet and exercise.  Follow lipid panel and liver function tests.  On simvastatin.        Relevant Medications   amLODipine (NORVASC) 5 MG tablet   Hypertension    Blood pressure elevated.  On losartan 100mg  q day.  Remain off hctz.  Will add amlodipine 5mg  q day.  Follow pressures.  Get her back in soon to reassess.        Relevant Medications   amLODipine (NORVASC) 5 MG tablet    Other Visit Diagnoses    Encounter for immunization       Relevant Orders   Flu vaccine HIGH DOSE PF (Completed)       [name], MD

## 2015-11-24 ENCOUNTER — Encounter: Payer: Self-pay | Admitting: Emergency Medicine

## 2015-11-24 ENCOUNTER — Inpatient Hospital Stay
Admission: EM | Admit: 2015-11-24 | Discharge: 2015-11-28 | DRG: 390 | Disposition: A | Discharge status: Home / Self Care | Payer: Commercial Managed Care - HMO | Attending: General Surgery | Admitting: General Surgery

## 2015-11-24 ENCOUNTER — Inpatient Hospital Stay: Payer: Commercial Managed Care - HMO

## 2015-11-24 ENCOUNTER — Other Ambulatory Visit: Payer: Self-pay

## 2015-11-24 ENCOUNTER — Emergency Department: Payer: Commercial Managed Care - HMO

## 2015-11-24 DIAGNOSIS — Z803 Family history of malignant neoplasm of breast: Secondary | ICD-10-CM | POA: Diagnosis not present

## 2015-11-24 DIAGNOSIS — M419 Scoliosis, unspecified: Secondary | ICD-10-CM | POA: Diagnosis not present

## 2015-11-24 DIAGNOSIS — Z791 Long term (current) use of non-steroidal anti-inflammatories (NSAID): Secondary | ICD-10-CM

## 2015-11-24 DIAGNOSIS — Z833 Family history of diabetes mellitus: Secondary | ICD-10-CM | POA: Diagnosis not present

## 2015-11-24 DIAGNOSIS — Z85038 Personal history of other malignant neoplasm of large intestine: Secondary | ICD-10-CM

## 2015-11-24 DIAGNOSIS — Z8249 Family history of ischemic heart disease and other diseases of the circulatory system: Secondary | ICD-10-CM

## 2015-11-24 DIAGNOSIS — K56609 Unspecified intestinal obstruction, unspecified as to partial versus complete obstruction: Secondary | ICD-10-CM | POA: Diagnosis not present

## 2015-11-24 DIAGNOSIS — E7800 Pure hypercholesterolemia, unspecified: Secondary | ICD-10-CM | POA: Diagnosis present

## 2015-11-24 DIAGNOSIS — R1084 Generalized abdominal pain: Secondary | ICD-10-CM

## 2015-11-24 DIAGNOSIS — Z9221 Personal history of antineoplastic chemotherapy: Secondary | ICD-10-CM

## 2015-11-24 DIAGNOSIS — Z79899 Other long term (current) drug therapy: Secondary | ICD-10-CM

## 2015-11-24 DIAGNOSIS — K2270 Barrett's esophagus without dysplasia: Secondary | ICD-10-CM | POA: Diagnosis present

## 2015-11-24 DIAGNOSIS — K56600 Partial intestinal obstruction, unspecified as to cause: Principal | ICD-10-CM | POA: Diagnosis present

## 2015-11-24 DIAGNOSIS — I10 Essential (primary) hypertension: Secondary | ICD-10-CM | POA: Diagnosis not present

## 2015-11-24 DIAGNOSIS — Z825 Family history of asthma and other chronic lower respiratory diseases: Secondary | ICD-10-CM | POA: Diagnosis not present

## 2015-11-24 DIAGNOSIS — K5651 Intestinal adhesions [bands], with partial obstruction: Secondary | ICD-10-CM | POA: Diagnosis not present

## 2015-11-24 DIAGNOSIS — Z4682 Encounter for fitting and adjustment of non-vascular catheter: Secondary | ICD-10-CM | POA: Diagnosis not present

## 2015-11-24 DIAGNOSIS — K449 Diaphragmatic hernia without obstruction or gangrene: Secondary | ICD-10-CM | POA: Diagnosis present

## 2015-11-24 DIAGNOSIS — R109 Unspecified abdominal pain: Secondary | ICD-10-CM | POA: Diagnosis not present

## 2015-11-24 DIAGNOSIS — Z978 Presence of other specified devices: Secondary | ICD-10-CM

## 2015-11-24 DIAGNOSIS — K5669 Other partial intestinal obstruction: Secondary | ICD-10-CM | POA: Diagnosis not present

## 2015-11-24 DIAGNOSIS — Z823 Family history of stroke: Secondary | ICD-10-CM | POA: Diagnosis not present

## 2015-11-24 DIAGNOSIS — Z8041 Family history of malignant neoplasm of ovary: Secondary | ICD-10-CM | POA: Diagnosis not present

## 2015-11-24 LAB — COMPREHENSIVE METABOLIC PANEL
ALBUMIN: 4.3 g/dL (ref 3.5–5.0)
ALK PHOS: 45 U/L (ref 38–126)
ALT: 20 U/L (ref 14–54)
ANION GAP: 8 (ref 5–15)
AST: 28 U/L (ref 15–41)
BILIRUBIN TOTAL: 0.9 mg/dL (ref 0.3–1.2)
BUN: 28 mg/dL — ABNORMAL HIGH (ref 6–20)
CALCIUM: 10 mg/dL (ref 8.9–10.3)
CO2: 26 mmol/L (ref 22–32)
CREATININE: 1.07 mg/dL — AB (ref 0.44–1.00)
Chloride: 108 mmol/L (ref 101–111)
GFR calc Af Amer: 54 mL/min — ABNORMAL LOW (ref >60)
GFR calc non Af Amer: 46 mL/min — ABNORMAL LOW (ref >60)
GLUCOSE: 113 mg/dL — AB (ref 65–99)
Potassium: 3.8 mmol/L (ref 3.5–5.1)
SODIUM: 142 mmol/L (ref 135–145)
TOTAL PROTEIN: 7.5 g/dL (ref 6.5–8.1)

## 2015-11-24 LAB — URINALYSIS COMPLETE WITH MICROSCOPIC (ARMC ONLY)
BILIRUBIN URINE: NEGATIVE
Bacteria, UA: NONE SEEN
GLUCOSE, UA: NEGATIVE mg/dL
Hgb urine dipstick: NEGATIVE
KETONES UR: NEGATIVE mg/dL
NITRITE: NEGATIVE
PH: 7 (ref 5.0–8.0)
Protein, ur: NEGATIVE mg/dL
RBC / HPF: NONE SEEN RBC/hpf (ref 0–5)
Specific Gravity, Urine: 1.014 (ref 1.005–1.030)

## 2015-11-24 LAB — TROPONIN I: Troponin I: <0.03 ng/mL (ref <0.03)

## 2015-11-24 LAB — CBC
HCT: 41.5 % (ref 35.0–47.0)
HEMOGLOBIN: 13.9 g/dL (ref 12.0–16.0)
MCH: 32.2 pg (ref 26.0–34.0)
MCHC: 33.4 g/dL (ref 32.0–36.0)
MCV: 96.5 fL (ref 80.0–100.0)
PLATELETS: 233 10*3/uL (ref 150–440)
RBC: 4.3 MIL/uL (ref 3.80–5.20)
RDW: 13.4 % (ref 11.5–14.5)
WBC: 9.7 10*3/uL (ref 3.6–11.0)

## 2015-11-24 LAB — LIPASE, BLOOD: Lipase: 27 U/L (ref 11–51)

## 2015-11-24 MED ORDER — ONDANSETRON HCL 4 MG/2ML IJ SOLN: 4.0000 mg | Freq: Four times a day (QID) | INTRAMUSCULAR | Status: DC | PRN

## 2015-11-24 MED ORDER — PANTOPRAZOLE SODIUM 40 MG PO TBEC
40.0000 mg | DELAYED_RELEASE_TABLET | Freq: Every day | ORAL | Status: DC
  Administered 2015-11-24 – 2015-11-28 (×5): 40 mg via ORAL
  Filled 2015-11-24 (×5): qty 1

## 2015-11-24 MED ORDER — MORPHINE SULFATE (PF) 2 MG/ML IV SOLN
2.0000 mg | Freq: Once | INTRAVENOUS | Status: AC
  Administered 2015-11-24: 2 mg via INTRAVENOUS
  Filled 2015-11-24: qty 1

## 2015-11-24 MED ORDER — MORPHINE SULFATE (PF) 2 MG/ML IV SOLN
2.0000 mg | INTRAVENOUS | Status: DC | PRN
Start: 2015-11-24 — End: 2015-11-28

## 2015-11-24 MED ORDER — AMLODIPINE BESYLATE 5 MG PO TABS
5.0000 mg | ORAL_TABLET | Freq: Every day | ORAL | Status: DC
  Administered 2015-11-24 – 2015-11-28 (×5): 5 mg via ORAL
  Filled 2015-11-24 (×5): qty 1

## 2015-11-24 MED ORDER — FENTANYL CITRATE (PF) 100 MCG/2ML IJ SOLN
50.0000 ug | Freq: Once | INTRAMUSCULAR | Status: AC
  Administered 2015-11-24: 50 ug via INTRAVENOUS

## 2015-11-24 MED ORDER — PHENOL 1.4 % MT LIQD
1.0000 | OROMUCOSAL | Status: DC | PRN
  Administered 2015-11-24: 1 via OROMUCOSAL
  Filled 2015-11-24: qty 177

## 2015-11-24 MED ORDER — MORPHINE SULFATE (PF) 2 MG/ML IV SOLN
INTRAVENOUS | Status: AC
  Administered 2015-11-24: 2 mg via INTRAVENOUS
  Filled 2015-11-24: qty 1

## 2015-11-24 MED ORDER — DIPHENHYDRAMINE HCL 50 MG/ML IJ SOLN
25.0000 mg | Freq: Once | INTRAMUSCULAR | Status: AC
  Administered 2015-11-24: 25 mg via INTRAVENOUS
  Filled 2015-11-24: qty 1

## 2015-11-24 MED ORDER — ONDANSETRON HCL 4 MG/2ML IJ SOLN
4.0000 mg | Freq: Once | INTRAMUSCULAR | Status: AC | PRN
  Administered 2015-11-24: 4 mg via INTRAVENOUS
  Filled 2015-11-24: qty 2

## 2015-11-24 MED ORDER — IOPAMIDOL (ISOVUE-300) INJECTION 61%
75.0000 mL | Freq: Once | INTRAVENOUS | Status: AC | PRN
  Administered 2015-11-24: 75 mL via INTRAVENOUS

## 2015-11-24 MED ORDER — METOPROLOL SUCCINATE ER 25 MG PO TB24
25.0000 mg | ORAL_TABLET | Freq: Every day | ORAL | Status: DC
  Administered 2015-11-24 – 2015-11-28 (×5): 25 mg via ORAL
  Filled 2015-11-24 (×5): qty 1

## 2015-11-24 MED ORDER — FENTANYL CITRATE (PF) 100 MCG/2ML IJ SOLN
INTRAMUSCULAR | Status: AC
  Filled 2015-11-24: qty 2

## 2015-11-24 MED ORDER — MECLIZINE HCL 25 MG PO TABS: 25.0000 mg | ORAL_TABLET | Freq: Three times a day (TID) | ORAL | Status: DC | PRN

## 2015-11-24 MED ORDER — DEXTROSE-NACL 5-0.45 % IV SOLN
INTRAVENOUS | Status: DC
  Administered 2015-11-24 – 2015-11-27 (×7): via INTRAVENOUS

## 2015-11-24 MED ORDER — IOPAMIDOL (ISOVUE-300) INJECTION 61%
30.0000 mL | Freq: Once | INTRAVENOUS | Status: AC | PRN
  Administered 2015-11-24: 30 mL via ORAL

## 2015-11-24 MED ORDER — ONDANSETRON 4 MG PO TBDP: 4.0000 mg | ORAL_TABLET | Freq: Four times a day (QID) | ORAL | Status: DC | PRN

## 2015-11-24 MED ORDER — ONDANSETRON HCL 4 MG/2ML IJ SOLN
INTRAMUSCULAR | Status: AC
  Administered 2015-11-24: 4 mg via INTRAVENOUS
  Filled 2015-11-24: qty 2

## 2015-11-24 MED ORDER — MORPHINE SULFATE (PF) 2 MG/ML IV SOLN
2.0000 mg | Freq: Once | INTRAVENOUS | Status: AC
  Administered 2015-11-24: 2 mg via INTRAVENOUS

## 2015-11-24 MED ORDER — ONDANSETRON HCL 4 MG/2ML IJ SOLN
4.0000 mg | Freq: Once | INTRAMUSCULAR | Status: AC
  Administered 2015-11-24: 4 mg via INTRAVENOUS

## 2015-11-24 NOTE — ED Provider Notes (Signed)
[facility] Emergency Department Provider Note   ____________________________________________   First MD Initiated Contact with Patient 11/24/15 0505     (approximate)  I have reviewed the triage vital signs and the nursing notes.   HISTORY  Chief Complaint Abdominal Pain    HPI [name] is a 82 y.o. female who presents to the ED from home via EMS with a chief complain of abdominal pain. Patient has a history of colon cancer with prior bowel obstruction. Reports intermittent sharp generalized abdominal pain since 6 PM. Patient vomited once. Had a bowel movement last evening and is still passing gas. Denies associated fever, chills, chest pain, shortness of breath, dysuria, hematuria. Denies recent travel or trauma. Nothing makes her symptoms better or worse.   Past Medical History:  Diagnosis Date  . Barrett's esophagus   . Bowel obstruction    s/p adhesion resection  . Chronic back pain   . Colon cancer ([place]) 1988 and 1989   adenocarcinoma, s/p resection x  2 and chemo  . Diverticulitis   . H/O open leg wound   . Hiatal hernia   . Hypercholesteremia   . Hypertension   . Lumbar scoliosis   . Peripheral neuropathy ([place])   . Recurrent sinus infections   . Renal cyst   . S/P chemotherapy, time since greater than 12 weeks    colon cancer  . Vertigo     Patient Active Problem List   Diagnosis Date Noted  . Swelling of left lower extremity 07/16/2015  . Acute Ileitis 06/12/2015  . Hypokalemia 06/12/2015  . Accelerated hypertension 06/12/2015  . Hereditary hemochromatosis ([place]) 05/25/2015  . Leg wound, right 05/19/2015  . Bilateral lower extremity edema 05/19/2015  . Iron excess 04/16/2015  . URI (upper respiratory infection) 01/18/2015  . Dizziness 10/26/2014  . Hyperbilirubinemia 10/26/2014  . Health care maintenance 05/18/2014  . History of pneumonia 05/18/2014  . Anemia, iron deficiency 04/21/2014  . Hospital discharge follow-up  04/14/2014  . Sweats, menopausal 03/07/2014  . Face lesion 11/03/2013  . Numbness in feet 10/08/2012  . Nocturia 10/08/2012  . Hypertension 12/18/2011  . Hypercholesteremia 12/18/2011  . History of colon cancer 12/18/2011  . Barrett's esophagus 12/18/2011  . Chronic back pain 12/18/2011  . Osteopenia 12/18/2011    Past Surgical History:  Procedure Laterality Date  . ABDOMINAL HYSTERECTOMY  1982  . adhesions resected     bowel obstruction  . APPENDECTOMY    . BREAST BIOPSY Left    negative 06/14/1985  . CHOLECYSTECTOMY    . COLON RESECTION     x2.  s/p colon cancer  . EXCISIONAL HEMORRHOIDECTOMY     with tubal ligation    Prior to Admission medications   Medication Sig Start Date End Date Taking? Authorizing Provider  amLODipine (NORVASC) 5 MG tablet Take 1 tablet (5 mg total) by mouth daily. 11/17/15  Yes [name], MD  Calcium Carbonate-Vitamin D (CALCIUM 600 + D PO) Take 600 Units by mouth 2 (two) times daily with a meal.    Yes Historical Provider, MD  fexofenadine (ALLEGRA) 60 MG tablet TAKE 1 TABLET EVERY DAY 11/10/15  Yes [name], MD  fish oil-omega-3 fatty acids 1000 MG capsule Take 2 g by mouth daily.   Yes Historical Provider, MD  losartan (COZAAR) 100 MG tablet Take 1 tablet (100 mg total) by mouth daily. 09/28/15  Yes [name], MD  meclizine (ANTIVERT) 25 MG tablet Take 25 mg by mouth 3 (three)  times daily as needed.   Yes Historical Provider, MD  meloxicam (MOBIC) 7.5 MG tablet Take 7.5 mg by mouth daily.   Yes Historical Provider, MD  metoprolol succinate (TOPROL-XL) 25 MG 24 hr tablet Take 1 tablet (25 mg total) by mouth daily. 12/15/14  Yes [name], MD  ondansetron (ZOFRAN) 4 MG tablet Take 1 tablet (4 mg total) by mouth 2 (two) times daily as needed for nausea or vomiting. 06/25/15  Yes [name], MD  oxyCODONE (OXY IR/ROXICODONE) 5 MG immediate release tablet Take 5 mg by mouth 3 (three) times daily.    Yes Historical Provider, MD    pantoprazole (PROTONIX) 40 MG tablet Take 40 mg by mouth 2 (two) times daily.   Yes Historical Provider, MD  simvastatin (ZOCOR) 10 MG tablet TAKE 1 TABLET AT BEDTIME 03/18/15  Yes [name], MD  traMADol-acetaminophen (ULTRACET) 37.5-325 MG per tablet Take 1 tablet by mouth 2 (two) times daily as needed.    Yes Historical Provider, MD    Allergies Review of patient's allergies indicates no known allergies.  Family History  Problem Relation Age of Onset  . Breast cancer Mother   . Asthma Mother   . Stroke Father   . Hypertension Father   . Diabetes Father   . Ovarian cancer Sister     x2  . Prostate cancer Brother   . Hypertension Brother     x3  . Hypercholesterolemia Brother     x3  . Diabetes Brother     Social History Social History  Substance Use Topics  . Smoking status: Never Smoker  . Smokeless tobacco: Never Used  . Alcohol use No    Review of Systems  Constitutional: No fever/chills. Eyes: No visual changes. ENT: No sore throat. Cardiovascular: Denies chest pain. Respiratory: Denies shortness of breath. Gastrointestinal: Positive for abdominal pain and vomiting.  No diarrhea.  No constipation. Genitourinary: Negative for dysuria. Musculoskeletal: Negative for back pain. Skin: Negative for rash. Neurological: Negative for headaches, focal weakness or numbness.  10-point ROS otherwise negative.  ____________________________________________   PHYSICAL EXAM:  VITAL SIGNS: ED Triage Vitals  Enc Vitals Group     BP 11/24/15 0450 (!) 170/77     Pulse Rate 11/24/15 0450 78     Resp 11/24/15 0450 (!) 22     Temp 11/24/15 0450 98.3 F (36.8 C)     Temp Source 11/24/15 0450 Oral     SpO2 11/24/15 0450 99 %     Weight 11/24/15 0451 140 lb (63.5 kg)     Height 11/24/15 0451 5\' 1"  (1.549 m)     Head Circumference --      Peak Flow --      Pain Score 11/24/15 0451 10     Pain Loc --      Pain Edu? --      Excl. in [place]? --     Constitutional:  Alert and oriented. Well appearing and in moderate acute distress. Eyes: Conjunctivae are normal. PERRL. EOMI. Head: Atraumatic. Nose: No congestion/rhinnorhea. Mouth/Throat: Mucous membranes are moist.  Oropharynx non-erythematous. Neck: No stridor.   Cardiovascular: Normal rate, regular rhythm. Grossly normal heart sounds.  Good peripheral circulation. Respiratory: Normal respiratory effort.  No retractions. Lungs CTAB. Gastrointestinal: Soft and moderately tender to palpation diffusely without rebound or guarding. No distention. No abdominal bruits. No CVA tenderness. Musculoskeletal: No lower extremity tenderness nor edema.  No joint effusions. Neurologic:  Normal speech and language. No gross focal neurologic deficits are  appreciated.  Skin:  Skin is warm, dry and intact. No rash noted. Psychiatric: Mood and affect are normal. Speech and behavior are normal.  ____________________________________________   LABS (all labs ordered are listed, but only abnormal results are displayed)  Labs Reviewed  COMPREHENSIVE METABOLIC PANEL - Abnormal; Notable for the following:       Result Value   Glucose, Bld 113 (*)    BUN 28 (*)    Creatinine, Ser 1.07 (*)    GFR calc non Af Amer 46 (*)    GFR calc Af Amer 54 (*)    All other components within normal limits  LIPASE, BLOOD  CBC  TROPONIN I  URINALYSIS COMPLETEWITH MICROSCOPIC ([facility] ONLY)   ____________________________________________  EKG  ED ECG REPORT I, SUNG,JADE J, the attending physician, personally viewed and interpreted this ECG.   Date: 11/24/2015  EKG Time: 0507  Rate: 68  Rhythm: normal EKG, normal sinus rhythm  Axis: Normal  Intervals:none  ST&T Change: Nonspecific  ____________________________________________  RADIOLOGY  Pending ____________________________________________   PROCEDURES  Procedure(s) performed: None  Procedures  Critical Care performed:  No  ____________________________________________   INITIAL IMPRESSION / ASSESSMENT AND PLAN / ED COURSE  Pertinent labs & imaging results that were available during my care of the patient were reviewed by me and considered in my medical decision making (see chart for details).  82 year old female with a history of colorectal cancer and prior bowel obstruction who presents with generalized abdominal pain with one episode of emesis. Will obtain screening lab work, administer analgesia and obtain CT abdomen/pelvis.  Clinical Course  Comment By Time  Patient re-dosed with morphine and zofran. So far has poorly tolerated oral contrast. Patient encouraged to drink as much as she can and then will proceed to CT scan. Care transferred to Dr. Burlene Arnt pending results of CT scan and urinalysis. Paulette Blanch, MD 10/10 [phone]     ____________________________________________   FINAL CLINICAL IMPRESSION(S) / ED DIAGNOSES  Final diagnoses:  Generalized abdominal pain      NEW MEDICATIONS STARTED DURING THIS VISIT:  New Prescriptions   No medications on file     Note:  This document was prepared using Dragon voice recognition software and may include unintentional dictation errors.    Paulette Blanch, MD 11/24/15 [phone]

## 2015-11-24 NOTE — Progress Notes (Signed)
Pt with orders for NGT placement. NGT successfully placed after three insertions attempts. Audible air sounds heard upon ausculation for placement of Naso gastric tube. X ray ordered for conformation and it showed "NG tube with tip within hiatal hernia. The NG tube should be repositioned and advanced into the stomach."  I notified Dr Jamal Collin of these results and he ordered RN to "leave it in" and monitor output. NGT flushes successfully and has put out a small amount of clear drainage.

## 2015-11-24 NOTE — ED Notes (Signed)
Assisted pt to BR

## 2015-11-24 NOTE — ED Provider Notes (Addendum)
-----------------------------------------   7:28 AM on 11/24/2015 -----------------------------------------  Blood pressure (!) 170/77, pulse 67, temperature 98.3 F (36.8 C), temperature source Oral, resp. rate 15, height 5\' 1"  (1.549 m), weight 140 lb (63.5 kg), SpO2 100 %.  Signed out to me at this time by Dr. Beather Arbour, plan for UA and ct and if neg may go home if pain controlled.    ----------------------------------------- 8:40 AM on 11/24/2015 -----------------------------------------  No active vomiting here, we'll defer nasogastric tube pending surgical consult. Surgery, Dr. Jamal Collin, aware of patient, findings, he has no further recommendations and he will come see her. Pt with pain but in no sig  Distress. abd tender but not peritoneal. Not vomiting.   Katrina Amor, MD 11/24/15 [id number]    Katrina Amor, MD 11/24/15 [id number]    Katrina Amor, MD 11/24/15 [place], MD 11/24/15 [phone]

## 2015-11-24 NOTE — Progress Notes (Signed)
Contacted on call Surgeon Pabon about abd x ray results stating that the NGT was coiled in her Hiatal Hernia. Per MD we will track her abd films and possibly advance the NG by fluoroscopy in AM. Per Md order will continue to leave NGT in place.

## 2015-11-24 NOTE — ED Notes (Signed)
Pt. Placed on 2L [place] O2 due to saturation dropping when pt. Would hold breath with pain. Pt. Maintaining 100 percent at this time and received pain medication, reporting some relief.

## 2015-11-24 NOTE — ED Triage Notes (Signed)
Pt. To ed by acems for abd. Pain. Pt. Has hx colon CA and bowel obstruction. Pt. States intermittent sharp abd pain since last pm 6 pm. Pt. vomitied x1. Denies other sx.

## 2015-11-24 NOTE — H&P (Signed)
[name] is an 82 y.o. female.   Chief Complaint: Abdominal pain HPI: 82 yo female presents to the ED with complaint of crampy mid-abdominal pain that started yesterday afternoon. Patient vomited once before she arrived at the ED. She describes similar episodes of this in the past attributed to small bowel obstruction, resolved with conservative management. Last admission was in April 2017.   Past Medical History:  Diagnosis Date  . Barrett's esophagus   . Bowel obstruction    s/p adhesion resection  . Chronic back pain   . Colon cancer ([place]) 1988 and 1989   adenocarcinoma, s/p resection x  2 and chemo  . Diverticulitis   . H/O open leg wound   . Hiatal hernia   . Hypercholesteremia   . Hypertension   . Lumbar scoliosis   . Peripheral neuropathy ([place])   . Recurrent sinus infections   . Renal cyst   . S/P chemotherapy, time since greater than 12 weeks    colon cancer  . Vertigo     Past Surgical History:  Procedure Laterality Date  . ABDOMINAL HYSTERECTOMY  1982  . adhesions resected     bowel obstruction  . APPENDECTOMY    . BREAST BIOPSY Left    negative 06/14/1985  . CHOLECYSTECTOMY    . COLON RESECTION     x2.  s/p colon cancer  . EXCISIONAL HEMORRHOIDECTOMY     with tubal ligation    Family History  Problem Relation Age of Onset  . Breast cancer Mother   . Asthma Mother   . Stroke Father   . Hypertension Father   . Diabetes Father   . Ovarian cancer Sister     x2  . Prostate cancer Brother   . Hypertension Brother     x3  . Hypercholesterolemia Brother     x3  . Diabetes Brother    Social History:  reports that she has never smoked. She has never used smokeless tobacco. She reports that she does not drink alcohol or use drugs.  Allergies: No Known Allergies  Medications Prior to Admission  Medication Sig Dispense Refill  . amLODipine (NORVASC) 5 MG tablet Take 1 tablet (5 mg total) by mouth daily. 30 tablet 1  . Calcium Carbonate-Vitamin D  (CALCIUM 600 + D PO) Take 600 Units by mouth 2 (two) times daily with a meal.     . fexofenadine (ALLEGRA) 60 MG tablet TAKE 1 TABLET EVERY DAY 90 tablet 3  . fish oil-omega-3 fatty acids 1000 MG capsule Take 2 g by mouth daily.    Marland Kitchen losartan (COZAAR) 100 MG tablet Take 1 tablet (100 mg total) by mouth daily. 90 tablet 1  . meclizine (ANTIVERT) 25 MG tablet Take 25 mg by mouth 3 (three) times daily as needed.    . meloxicam (MOBIC) 7.5 MG tablet Take 7.5 mg by mouth daily.    . metoprolol succinate (TOPROL-XL) 25 MG 24 hr tablet Take 1 tablet (25 mg total) by mouth daily. 90 tablet 3  . ondansetron (ZOFRAN) 4 MG tablet Take 1 tablet (4 mg total) by mouth 2 (two) times daily as needed for nausea or vomiting. 30 tablet 0  . oxyCODONE (OXY IR/ROXICODONE) 5 MG immediate release tablet Take 5 mg by mouth 3 (three) times daily.     . pantoprazole (PROTONIX) 40 MG tablet Take 40 mg by mouth 2 (two) times daily.    . simvastatin (ZOCOR) 10 MG tablet TAKE 1 TABLET AT BEDTIME 90  tablet 3  . traMADol-acetaminophen (ULTRACET) 37.5-325 MG per tablet Take 1 tablet by mouth 2 (two) times daily as needed.       Results for orders placed or performed during the hospital encounter of 11/24/15 (from the past 48 hour(s))  Lipase, blood     Status: None   Collection Time: 11/24/15  4:57 AM  Result Value Ref Range   Lipase 27 11 - 51 U/L  Comprehensive metabolic panel     Status: Abnormal   Collection Time: 11/24/15  4:57 AM  Result Value Ref Range   Sodium 142 135 - 145 mmol/L   Potassium 3.8 3.5 - 5.1 mmol/L   Chloride 108 101 - 111 mmol/L   CO2 26 22 - 32 mmol/L   Glucose, Bld 113 (H) 65 - 99 mg/dL   BUN 28 (H) 6 - 20 mg/dL   Creatinine, Ser 1.07 (H) 0.44 - 1.00 mg/dL   Calcium 10.0 8.9 - 10.3 mg/dL   Total Protein 7.5 6.5 - 8.1 g/dL   Albumin 4.3 3.5 - 5.0 g/dL   AST 28 15 - 41 U/L   ALT 20 14 - 54 U/L   Alkaline Phosphatase 45 38 - 126 U/L   Total Bilirubin 0.9 0.3 - 1.2 mg/dL   GFR calc non Af  Amer 46 (L) >60 mL/min   GFR calc Af Amer 54 (L) >60 mL/min    Comment: (NOTE) The eGFR has been calculated using the CKD EPI equation. This calculation has not been validated in all clinical situations. eGFR's persistently <60 mL/min signify possible Chronic Kidney Disease.    Anion gap 8 5 - 15  CBC     Status: None   Collection Time: 11/24/15  4:57 AM  Result Value Ref Range   WBC 9.7 3.6 - 11.0 K/uL   RBC 4.30 3.80 - 5.20 MIL/uL   Hemoglobin 13.9 12.0 - 16.0 g/dL   HCT 41.5 35.0 - 47.0 %   MCV 96.5 80.0 - 100.0 fL   MCH 32.2 26.0 - 34.0 pg   MCHC 33.4 32.0 - 36.0 g/dL   RDW 13.4 11.5 - 14.5 %   Platelets 233 150 - 440 K/uL  Troponin I     Status: None   Collection Time: 11/24/15  4:57 AM  Result Value Ref Range   Troponin I <0.03 <0.03 ng/mL  Urinalysis complete, with microscopic     Status: Abnormal   Collection Time: 11/24/15  7:26 AM  Result Value Ref Range   Color, Urine YELLOW (A) YELLOW   APPearance CLOUDY (A) CLEAR   Glucose, UA NEGATIVE NEGATIVE mg/dL   Bilirubin Urine NEGATIVE NEGATIVE   Ketones, ur NEGATIVE NEGATIVE mg/dL   Specific Gravity, Urine 1.014 1.005 - 1.030   Hgb urine dipstick NEGATIVE NEGATIVE   pH 7.0 5.0 - 8.0   Protein, ur NEGATIVE NEGATIVE mg/dL   Nitrite NEGATIVE NEGATIVE   Leukocytes, UA 1+ (A) NEGATIVE   RBC / HPF NONE SEEN 0 - 5 RBC/hpf   WBC, UA 6-30 0 - 5 WBC/hpf   Bacteria, UA NONE SEEN NONE SEEN   Squamous Epithelial / LPF 0-5 (A) NONE SEEN   Mucous PRESENT    Amorphous Crystal PRESENT    Ct Abdomen Pelvis W Contrast  Result Date: 11/24/2015 CLINICAL DATA:  Generalized abdominal pain. Vomiting. History of small bowel obstruction EXAM: CT ABDOMEN AND PELVIS WITH CONTRAST TECHNIQUE: Multidetector CT imaging of the abdomen and pelvis was performed using the standard protocol following bolus administration  of intravenous contrast. CONTRAST:  105m ISOVUE-300 IOPAMIDOL (ISOVUE-300) INJECTION 61% COMPARISON:  06/11/2015 FINDINGS: Lower  chest:  Moderate sliding hiatal hernia. Hepatobiliary: No focal liver abnormality.Cholecystectomy with chronic intra and extrahepatic bile duct dilatation. Bilirubin is normal today. Pancreas: Chronic dilatation of the main pancreatic duct to without visible mass or obstructive process. This finding is chronic since at least 2009. Spleen: Granulomatous calcifications. Adrenals/Urinary Tract: Negative adrenals. No hydronephrosis or stone. Left renal cyst with benign mural calcifications measuring up to 55 mm in the axial plane. Unremarkable bladder. Stomach/Bowel: Dilated fluid-filled small bowel with abrupt transition point in the right lower quadrant, occurring in the region of 2:48 to 56. In this region is angulated bowel, likely adhesive disease. No visible hernia or mass. Status post partial right colectomy with ileocolic anastomosis. There is a postoperative aneurysmal segment of small bowel in the right abdomen . Reactive congestion of the small bowel mesentery. No perforation or abscess. Vascular/Lymphatic: No acute vascular abnormality. Atherosclerotic calcification of the aorta and branch vessels. No mass or adenopathy. Reproductive:Hysterectomy. Other: Small reactive ascites. Musculoskeletal: Severe disc and facet degeneration with lumbar levoscoliosis. No acute or aggressive finding. IMPRESSION: 1. High-grade small bowel obstruction with transition in the right lower quadrant, likely adhesions. 2. Partial right hemicolectomy. 3. Chronic biliary and pancreatic duct dilatation. 4. Moderate sliding hiatal hernia. Electronically Signed   By: JMonte FantasiaM.D.   On: 11/24/2015 08:26    Review of Systems  Constitutional: Negative.   Respiratory: Negative.   Cardiovascular: Negative.   Gastrointestinal: Positive for abdominal pain and vomiting. Negative for constipation and diarrhea.  Genitourinary: Negative.     Blood pressure (!) 150/60, pulse 68, temperature 98.6 F (37 C), temperature source  Oral, resp. rate 17, height 5' 1"  (1.549 m), weight 140 lb (63.5 kg), SpO2 98 %. Physical Exam  Constitutional: She is oriented to person, place, and time. She appears well-developed and well-nourished.  Eyes: Conjunctivae are normal. No scleral icterus.  Neck: Neck supple.  Cardiovascular: Normal rate and regular rhythm.   Respiratory: Effort normal and breath sounds normal. No respiratory distress.  GI: Soft. Bowel sounds are normal. She exhibits no distension and no mass. There is no tenderness. No hernia.  Neurological: She is alert and oriented to person, place, and time.  Skin: Skin is warm and dry.     Assessment/Plan Clinical findings suggest a partial small bowel obstruction. Current CT scan was reviewed along with the one from April and the findings are fairly similar. Patient has not had an upper GI SBFT, but she has been followed by GI (Dr. ETiffany Kocher every 6 months or so. Plan to admit the patient, hydrate, NPO, and follow clinical course.  Will discuss with Dr. ETiffany Kocher Patient advised fully and is agreeable to the plan.  SChristene Lye MD 11/24/2015, 2:32 PM

## 2015-11-25 ENCOUNTER — Inpatient Hospital Stay: Payer: Commercial Managed Care - HMO

## 2015-11-25 LAB — CBC
HEMATOCRIT: 38.5 % (ref 35.0–47.0)
HEMOGLOBIN: 13.3 g/dL (ref 12.0–16.0)
MCH: 33.3 pg (ref 26.0–34.0)
MCHC: 34.5 g/dL (ref 32.0–36.0)
MCV: 96.5 fL (ref 80.0–100.0)
Platelets: 199 10*3/uL (ref 150–440)
RBC: 3.99 MIL/uL (ref 3.80–5.20)
RDW: 13.4 % (ref 11.5–14.5)
WBC: 9.3 10*3/uL (ref 3.6–11.0)

## 2015-11-25 LAB — BASIC METABOLIC PANEL
ANION GAP: 9 (ref 5–15)
BUN: 12 mg/dL (ref 6–20)
CHLORIDE: 110 mmol/L (ref 101–111)
CO2: 20 mmol/L — ABNORMAL LOW (ref 22–32)
Calcium: 8.9 mg/dL (ref 8.9–10.3)
Creatinine, Ser: 0.7 mg/dL (ref 0.44–1.00)
GFR calc Af Amer: >60 mL/min (ref >60)
Glucose, Bld: 135 mg/dL — ABNORMAL HIGH (ref 65–99)
POTASSIUM: 3.3 mmol/L — AB (ref 3.5–5.1)
SODIUM: 139 mmol/L (ref 135–145)

## 2015-11-25 LAB — URINALYSIS COMPLETE WITH MICROSCOPIC (ARMC ONLY)
BILIRUBIN URINE: NEGATIVE
Glucose, UA: NEGATIVE mg/dL
HGB URINE DIPSTICK: NEGATIVE
Ketones, ur: NEGATIVE mg/dL
LEUKOCYTES UA: NEGATIVE
NITRITE: NEGATIVE
PH: 7 (ref 5.0–8.0)
PROTEIN: NEGATIVE mg/dL
Specific Gravity, Urine: 1.004 — ABNORMAL LOW (ref 1.005–1.030)
Squamous Epithelial / LPF: NONE SEEN

## 2015-11-25 LAB — GLUCOSE, CAPILLARY: GLUCOSE-CAPILLARY: 121 mg/dL — AB (ref 65–99)

## 2015-11-25 MED ORDER — LORAZEPAM 2 MG/ML IJ SOLN
1.0000 mg | Freq: Three times a day (TID) | INTRAMUSCULAR | Status: DC | PRN
  Administered 2015-11-25 (×2): 1 mg via INTRAVENOUS
  Filled 2015-11-25 (×2): qty 1

## 2015-11-25 NOTE — Progress Notes (Signed)
Notified attending MD about patient continuing to feel the urge to urinate with scant amount of output. Bladder scanned of patient resulted in volume greater than 600. New order to insert foley added.

## 2015-11-25 NOTE — Progress Notes (Signed)
Pt was asleep after an exhausting night.  Three adult daughters and one husband were present in room.  Daughters stated what was most needed was rest.  Family accepted offer of prayer which [place] gave.   11/25/15 1035  Clinical Encounter Type  Visited With Patient and family together  Visit Type Initial  Referral From Nurse  Spiritual Encounters  Spiritual Needs Prayer;Emotional  Stress Factors  Patient Stress Factors Exhausted  Family Stress Factors None identified

## 2015-11-25 NOTE — Progress Notes (Signed)
Patient ID: [name], female   DOB: December 16, 1928, 82 y.o.   MRN: [id number] Pt got confused pvernight, required a sitter for  Safety. No reported n/v. Pt is semi awake, confused some,. AVSS. Abdomen is soft, much less distended and not tympanitic. Few bowel sounds. Lungs clear. Labs are normal. Abd Xray- nonspecific gas .  SBO appears to be clinically better. Will continue NPO today. She reportedly had 3-4 abdominal surgeries, two that involve colon resections.  Hopefully her SBO will resolve and can obtain a SBFT study.

## 2015-11-25 NOTE — Consult Note (Signed)
Patient coughed out her NG tube which was not in great position due to large hiatal hernia.  She appears to be doing well without it but has had some confusion last night and today.  Abd better on palpation.  I will be out of town and Dr. Gustavo Lah is to cover today and tomorrow if GI is needed.

## 2015-11-25 NOTE — Progress Notes (Signed)
Notified dr.pabon of pt increasing confusion and agitation. Prn ativan given with no success. Pt continues to try to get out of bed and is restless. Prn sitter ordered.

## 2015-11-25 NOTE — Progress Notes (Signed)
md notified that pt's ng tube has come out. md okay with leaving it out at this time. Continue to monitor.

## 2015-11-26 ENCOUNTER — Inpatient Hospital Stay: Payer: Commercial Managed Care - HMO

## 2015-11-26 NOTE — Care Management Important Message (Signed)
Important Message  Patient Details  Name: [name] MRN: [id number] Date of Birth: 1928-12-28   Medicare Important Message Given:  Yes    Shelbie Ammons, RN 11/26/2015, 11:04 AM

## 2015-11-26 NOTE — Consult Note (Addendum)
Subjective: Patient seen for small bowel obstruction.  Patient with episode of confusion yesterday.  This much better today.  Patient had a large bm last night and is passing flatus today.  No nausea, minimal abdominal discomfort.  She states she does not feel distended.    Objective: Vital signs in last 24 hours: Temp:  [97.4 F (36.3 C)-98.6 F (37 C)] 98.6 F (37 C) (10/12 0336) Pulse Rate:  [73-79] 79 (10/12 1056) Resp:  [18-19] 18 (10/12 0336) BP: (148-167)/(56-58) 164/56 (10/12 1056) SpO2:  [96 %-100 %] 96 % (10/12 0336) Weight:  [68.2 kg (150 lb 6.4 oz)] 68.2 kg (150 lb 6.4 oz) (10/12 0602) Blood pressure (!) 164/56, pulse 79, temperature 98.6 F (37 C), temperature source Oral, resp. rate 18, height 5\' 1"  (1.549 m), weight 68.2 kg (150 lb 6.4 oz), SpO2 96 %.   Intake/Output from previous day: 10/11 0701 - 10/12 0700 In: 2329 [I.V.:2329] Out: 2565 [Urine:2565]  Intake/Output this shift: Total I/O In: 448 [I.V.:448] Out: 500 [Urine:500]   General appearance:  86 f no distress. Resp:  bcta Cardio:  rrr GI:  Soft nontender, nondistended. bowel sounds positive Extremities:     Lab Results: Results for orders placed or performed during the hospital encounter of 11/24/15 (from the past 24 hour(s))  Urinalysis complete, with microscopic ([facility] only)     Status: Abnormal   Collection Time: 11/25/15  1:22 PM  Result Value Ref Range   Color, Urine STRAW (A) YELLOW   APPearance CLEAR (A) CLEAR   Glucose, UA NEGATIVE NEGATIVE mg/dL   Bilirubin Urine NEGATIVE NEGATIVE   Ketones, ur NEGATIVE NEGATIVE mg/dL   Specific Gravity, Urine 1.004 (L) 1.005 - 1.030   Hgb urine dipstick NEGATIVE NEGATIVE   pH 7.0 5.0 - 8.0   Protein, ur NEGATIVE NEGATIVE mg/dL   Nitrite NEGATIVE NEGATIVE   Leukocytes, UA NEGATIVE NEGATIVE   RBC / HPF 0-5 0 - 5 RBC/hpf   WBC, UA 0-5 0 - 5 WBC/hpf   Bacteria, UA MANY (A) NONE SEEN   Squamous Epithelial / LPF NONE SEEN NONE SEEN  Glucose, capillary      Status: Abnormal   Collection Time: 11/25/15  8:59 PM  Result Value Ref Range   Glucose-Capillary 121 (H) 65 - 99 mg/dL      Recent Labs  11/24/15 0457 11/25/15 0741  WBC 9.7 9.3  HGB 13.9 13.3  HCT 41.5 38.5  PLT 233 199   BMET  Recent Labs  11/24/15 0457 11/25/15 0741  NA 142 139  K 3.8 3.3*  CL 108 110  CO2 26 20*  GLUCOSE 113* 135*  BUN 28* 12  CREATININE 1.07* 0.70  CALCIUM 10.0 8.9   LFT  Recent Labs  11/24/15 0457  PROT 7.5  ALBUMIN 4.3  AST 28  ALT 20  ALKPHOS 45  BILITOT 0.9   PT/INR No results for input(s): LABPROT, INR in the last 72 hours. Hepatitis Panel No results for input(s): HEPBSAG, HCVAB, HEPAIGM, HEPBIGM in the last 72 hours. C-Diff No results for input(s): CDIFFTOX in the last 72 hours. No results for input(s): CDIFFPCR in the last 72 hours.   Studies/Results: Dg Abd 1 View  Result Date: 11/24/2015 CLINICAL DATA:  NG tube placement EXAM: ABDOMEN - 1 VIEW COMPARISON:  None. FINDINGS: NG tube coiled within hiatal hernia with tip cephalad about 4 cm above the left hemidiaphragm. IMPRESSION: NG tube with tip within hiatal hernia. The NG tube should be repositioned and advanced into the  stomach. Electronically Signed   By: Lahoma Crocker M.D.   On: 11/24/2015 16:37   Dg Abd 2 Views  Result Date: 11/26/2015 CLINICAL DATA:  Small-bowel obstruction EXAM: ABDOMEN - 2 VIEW COMPARISON:  11/25/2015 FINDINGS: Scattered large and small bowel gas is noted. No free air is seen. The overall appearance of the small bowel is nondilated. No abnormal mass is seen. Postsurgical changes are noted in the right abdomen. Stable scoliosis of the lumbar spine is noted. IMPRESSION: No definitive obstruction is identified.  No free air is seen. Electronically Signed   By: Inez Catalina M.D.   On: 11/26/2015 09:40   Dg Abd 2 Views  Result Date: 11/25/2015 CLINICAL DATA:  82 year old female with high-grade small bowel obstruction, transition in the right lower  quadrant. Initial encounter. EXAM: ABDOMEN - 2 VIEW COMPARISON:  CT Abdomen and Pelvis 11/24/2015 and earlier. FINDINGS: Upright and supine views of the abdomen and pelvis. NG tube has been removed. Numerous surgical clips and staples in the right lower abdomen. No significant change in the bowel gas pattern since the CT on 11/24/2015 at which time the fluid-filled small bowel loops were radiographically occult. No pneumoperitoneum. Stable lung bases. Moderate to severe levoconvex lumbar scoliosis. Stable visualized osseous structures. IMPRESSION: 1. Bowel-gas pattern not significantly changed since the CT on 11/24/2015 at which time the fluid-filled dilated small bowel loops were radiographically occult. 2. No free air. Electronically Signed   By: Genevie Ann M.D.   On: 11/25/2015 09:21    Scheduled Inpatient Medications:   . amLODipine  5 mg Oral Daily  . metoprolol succinate  25 mg Oral Daily  . pantoprazole  40 mg Oral Daily    Continuous Inpatient Infusions:   . dextrose 5 % and 0.45% NaCl 100 mL/hr at 11/26/15 1100    PRN Inpatient Medications:  LORazepam, meclizine, morphine injection, ondansetron **OR** ondansetron (ZOFRAN) IV, phenol  Miscellaneous:   Assessment:  1) partial SBO, improving clinically  Plan:  1) agree with surgery plans for ugis/sbs when clinically feasible.  May be possible to start some non-carbonated clears today.    Dr Vira Agar and I are not abailable until Monday.  If GI services are required, Dr Epimenio Foot will be available over the weekend.   Lollie Sails MD 11/26/2015, 11:51 AM

## 2015-11-26 NOTE — Progress Notes (Addendum)
Patient ID: [name], female   DOB: [birth year], 82 y.o.   MRN: [id number] Patient is awake alert and oriented. Says she is feeling much better today. Denies any nausea or vomiting Still with the some intermittent abdominal pain but very minimal. Yesterday the patient had the urge to void but voided only a small quantity with subsequent bladder scan revealing a residual of greater than 600 mL. A Foley catheter was inserted that time. Patient has not had any trouble as far as voiding in the past-this may have been a single occurrence yesterday. Abdominal x-rays today reveal a nonspecific gas pattern no suggestion of any distended bowel and all She is afebrile and vital signs are stable Abdomen is soft and nontender and nondistended. Clinically the patient appears to have  resolved her small bowel obstruction. Plan-started on clear liquids today and reduce her IV fluids. Foley to be removed and the watch for any evidence of trouble voiding afterwards. We'll plan for upper GI with small bowel series tomorrow given the fact that the patient has been having recurring episodes of small bowel obstruction. All of this discussed fully with the patient and her daughter and nursing

## 2015-11-27 ENCOUNTER — Inpatient Hospital Stay: Payer: Commercial Managed Care - HMO

## 2015-11-27 NOTE — Progress Notes (Signed)
Patient ID: [name], female   DOB: [birth year], 82 y.o.   MRN: [id number] No complainbts. Tolereating liquids po weel. AVSS. SBFT- no obstruction or any narrowing. Transit time is normal;. SBO resolved. Advance diet. Likely home tomorrow If no recurrence of abd pain/distension

## 2015-11-28 NOTE — Progress Notes (Signed)
Called Dr. Jamal Collin regarding medication for sleep per patient request.  Doctor did not want to give medication due to previous confusion and agitation.  Phoebe Sharps N  11/28/2015   1:53 AM

## 2015-11-28 NOTE — Progress Notes (Signed)
Patient discharged to home as ordered. IV already discontinued prior to my shift.  Discharge instructions given as ordered with follow up appointments. Patient husband and daughter at the bedside to take patient home. Patient is alert and oriented ambulates with minimal assistance. AM  meds given prior to discharge. No acute distress noted.

## 2015-11-28 NOTE — Discharge Summary (Signed)
Physician Discharge Summary  Patient ID: [name] MRN: [id number] DOB/AGE: 11-09-1928 87 y.o.  Admit date: 11/24/2015 Discharge date: 11/28/2015  Admission Diagnoses:Small bowel obstruction  Discharge Diagnoses:   Active Problems:   Small bowel obstruction   Discharged Condition: good  Hospital Course: This 82 year old female presented to the emergency room with the complaint of crampy abdominal pain associated with vomiting. Evaluation in the emergency room on 11/24/2015 revealed that she had features of small bowel obstruction. Patient was subsequently admitted. She has had multiple episodes of similar partial small bowel obstruction which have resolved without any surgical intervention. Previously she had had right colon resection for cancer in the 80s and subsequently hadn't on that segment of colon removed for a recent are not clear. Her last episode of this problem was in April of this year. After admission the patient was kept nothing by mouth and IV fluids. An NG tube was attempted however it tended to coil up in the stomach was lying within the thoracic cavity. On the next 2448 hrs. the patient had no recurrence of abdominal pain and had return of bowel activity. Due to the admission the patient was also seen in consultation by Dr. Vira Agar who has been following this patient for many years. After her small bowel obstruction appeared clear completely she was advanced on her oral intake. An upper GI and small bowel follow-through was obtained which showed no evidence of narrowing or any potential obstruction in the small intestine. The transit time was 90 minutes. At this point the patient is being discharged and advised to follow up as needed and to keep her scheduled follow-up with Dr. Vira Agar  Consults: gastroenterology  Significant Diagnostic Studies: radiology: CT scan: UGI with small bowel and          Treatments: IV hydration  Discharge Exam: Blood pressure (!) 164/58,  pulse 77, temperature 98.4 F (36.9 C), temperature source Oral, resp. rate 17, height 5\' 1"  (1.549 m), weight 155 lb 9.6 oz (70.6 kg), SpO2 100 %. GI: soft, non-tender; bowel sounds normal; no masses,  no organomegaly  Lungs are clear. Heart in sinus rhythm.  Disposition: 01-Home or Self Care  Discharge Instructions    Call MD for:  persistant nausea and vomiting    Complete by:  As directed    Call MD for:  severe uncontrolled pain    Complete by:  As directed    Diet - low sodium heart healthy    Complete by:  As directed    Discharge instructions    Complete by:  As directed    Call for any recurrence of abdominal pain, nausea and vomiting   Increase activity slowly    Complete by:  As directed        Medication List    TAKE these medications   amLODipine 5 MG tablet Commonly known as:  NORVASC Take 1 tablet (5 mg total) by mouth daily.   CALCIUM 600 + D PO Take 600 Units by mouth 2 (two) times daily with a meal.   fexofenadine 60 MG tablet Commonly known as:  ALLEGRA TAKE 1 TABLET EVERY DAY   fish oil-omega-3 fatty acids 1000 MG capsule Take 2 g by mouth daily.   losartan 100 MG tablet Commonly known as:  COZAAR Take 1 tablet (100 mg total) by mouth daily.   meclizine 25 MG tablet Commonly known as:  ANTIVERT Take 25 mg by mouth 3 (three) times daily as needed.   meloxicam 7.5 MG  tablet Commonly known as:  MOBIC Take 7.5 mg by mouth daily.   metoprolol succinate 25 MG 24 hr tablet Commonly known as:  TOPROL-XL Take 1 tablet (25 mg total) by mouth daily.   ondansetron 4 MG tablet Commonly known as:  ZOFRAN Take 1 tablet (4 mg total) by mouth 2 (two) times daily as needed for nausea or vomiting.   oxyCODONE 5 MG immediate release tablet Commonly known as:  Oxy IR/ROXICODONE Take 5 mg by mouth 3 (three) times daily.   pantoprazole 40 MG tablet Commonly known as:  PROTONIX Take 40 mg by mouth 2 (two) times daily.   simvastatin 10 MG tablet Commonly  known as:  ZOCOR TAKE 1 TABLET AT BEDTIME   traMADol-acetaminophen 37.5-325 MG tablet Commonly known as:  ULTRACET Take 1 tablet by mouth 2 (two) times daily as needed.      Follow-up Information    Gaylyn Cheers, MD Follow up today.   Specialty:  Gastroenterology Why:  as previously scheduled Contact information: [place] [address] [phone]           Signed: Christene Lye 11/28/2015, 8:56 AM

## 2015-11-28 NOTE — Progress Notes (Signed)
Pt had a small amount of blood in her stool this AM. Spoke to patient and patient stated that she has hemmorhoids and blood in her stool is a regular thing. Will continue to monitor. Villa Herb RN-BC.

## 2015-11-29 ENCOUNTER — Encounter: Payer: Self-pay | Admitting: Internal Medicine

## 2015-11-29 NOTE — Assessment & Plan Note (Signed)
Blood pressure elevated.  On losartan 100mg  q day.  Remain off hctz.  Will add amlodipine 5mg  q day.  Follow pressures.  Get her back in soon to reassess.

## 2015-11-29 NOTE — Assessment & Plan Note (Signed)
Seeing Dr Chasnis.  Stable.   

## 2015-11-29 NOTE — Assessment & Plan Note (Signed)
Low cholesterol diet and exercise.  Follow lipid panel and liver function tests.  On simvastatin.   

## 2015-11-29 NOTE — Assessment & Plan Note (Signed)
Followed by hematology 

## 2015-11-29 NOTE — Assessment & Plan Note (Signed)
On protonix.  Followed by GI.

## 2015-11-29 NOTE — Assessment & Plan Note (Signed)
Colonoscopy 07/30/10 -  Internal hemorrhoids.  Followed by GI.

## 2015-12-08 ENCOUNTER — Other Ambulatory Visit: Payer: Commercial Managed Care - HMO

## 2015-12-15 ENCOUNTER — Other Ambulatory Visit: Payer: Commercial Managed Care - HMO

## 2015-12-17 ENCOUNTER — Ambulatory Visit (INDEPENDENT_AMBULATORY_CARE_PROVIDER_SITE_OTHER): Payer: Commercial Managed Care - HMO | Admitting: Internal Medicine

## 2015-12-17 ENCOUNTER — Encounter: Payer: Self-pay | Admitting: Internal Medicine

## 2015-12-17 DIAGNOSIS — E7800 Pure hypercholesterolemia, unspecified: Secondary | ICD-10-CM

## 2015-12-17 DIAGNOSIS — G8929 Other chronic pain: Secondary | ICD-10-CM

## 2015-12-17 DIAGNOSIS — I10 Essential (primary) hypertension: Secondary | ICD-10-CM

## 2015-12-17 DIAGNOSIS — K56609 Unspecified intestinal obstruction, unspecified as to partial versus complete obstruction: Secondary | ICD-10-CM | POA: Diagnosis not present

## 2015-12-17 DIAGNOSIS — M545 Low back pain: Secondary | ICD-10-CM

## 2015-12-17 NOTE — Progress Notes (Signed)
Patient ID: [name], female   DOB: [birth year], 82 y.o.   MRN: [id number]   Subjective:    Patient ID: [name], female    DOB: Dec 04, 1928, 82 y.o.   MRN: [id number]  HPI  Patient here for a scheduled follow up.  She is accompanied by her husband.  History obtained from both of them.  Recently hospitalized 11/24/15 - with partial bowels obstruction.  Had NG tube placed.  Has a large hiatal hernia.  Symptoms improved.  Discharged.  She is doing well since being discharged.  No abdominal pain.  Bowels moving.  No nausea or vomiting.  Blood pressure has been a little elevated.     Past Medical History:  Diagnosis Date  . Barrett's esophagus   . Bowel obstruction    s/p adhesion resection  . Chronic back pain   . Colon cancer ([place]) 1988 and 1989   adenocarcinoma, s/p resection x  2 and chemo  . Diverticulitis   . H/O open leg wound   . Hiatal hernia   . Hypercholesteremia   . Hypertension   . Lumbar scoliosis   . Peripheral neuropathy ([place])   . Recurrent sinus infections   . Renal cyst   . S/P chemotherapy, time since greater than 12 weeks    colon cancer  . Vertigo    Past Surgical History:  Procedure Laterality Date  . ABDOMINAL HYSTERECTOMY  1982  . adhesions resected     bowel obstruction  . APPENDECTOMY    . BREAST BIOPSY Left    negative 06/14/1985  . CHOLECYSTECTOMY    . COLON RESECTION     x2.  s/p colon cancer  . EXCISIONAL HEMORRHOIDECTOMY     with tubal ligation   Family History  Problem Relation Age of Onset  . Breast cancer Mother   . Asthma Mother   . Stroke Father   . Hypertension Father   . Diabetes Father   . Ovarian cancer Sister     x2  . Prostate cancer Brother   . Hypertension Brother     x3  . Hypercholesterolemia Brother     x3  . Diabetes Brother    Social History   Social History  . Marital status: Married    Spouse name: N/A  . Number of children: 4  . Years of education: 12th grade   Occupational History  . homemaker     Social History Main Topics  . Smoking status: Never Smoker  . Smokeless tobacco: Never Used  . Alcohol use No  . Drug use: No  . Sexual activity: No   Other Topics Concern  . None   Social History Narrative  . None    Outpatient Encounter Prescriptions as of 12/17/2015  Medication Sig  . amLODipine (NORVASC) 5 MG tablet Take 1 tablet (5 mg total) by mouth daily.  . Calcium Carbonate-Vitamin D (CALCIUM 600 + D PO) Take 600 Units by mouth 2 (two) times daily with a meal.   . fexofenadine (ALLEGRA) 60 MG tablet TAKE 1 TABLET EVERY DAY  . fish oil-omega-3 fatty acids 1000 MG capsule Take 2 g by mouth daily.  Marland Kitchen losartan (COZAAR) 100 MG tablet Take 1 tablet (100 mg total) by mouth daily.  . meclizine (ANTIVERT) 25 MG tablet Take 25 mg by mouth 3 (three) times daily as needed.  . meloxicam (MOBIC) 7.5 MG tablet Take 7.5 mg by mouth daily.  . metoprolol succinate (TOPROL-XL) 25 MG 24 hr tablet Take  1 tablet (25 mg total) by mouth daily.  . ondansetron (ZOFRAN) 4 MG tablet Take 1 tablet (4 mg total) by mouth 2 (two) times daily as needed for nausea or vomiting.  Marland Kitchen oxyCODONE (OXY IR/ROXICODONE) 5 MG immediate release tablet Take 5 mg by mouth 3 (three) times daily.   . pantoprazole (PROTONIX) 40 MG tablet Take 40 mg by mouth 2 (two) times daily.  . simvastatin (ZOCOR) 10 MG tablet TAKE 1 TABLET AT BEDTIME  . traMADol-acetaminophen (ULTRACET) 37.5-325 MG per tablet Take 1 tablet by mouth 2 (two) times daily as needed.    No facility-administered encounter medications on file as of 12/17/2015.     Review of Systems  Constitutional: Negative for appetite change and unexpected weight change.  HENT: Negative for congestion and sinus pressure.   Respiratory: Negative for cough, chest tightness and shortness of breath.   Cardiovascular: Negative for chest pain, palpitations and leg swelling.  Gastrointestinal: Negative for abdominal pain, diarrhea, nausea and vomiting.  Musculoskeletal:  Negative for myalgias.       Chronic back pain.   Skin: Negative for color change and rash.  Neurological: Negative for dizziness and headaches.  Psychiatric/Behavioral: Negative for agitation and dysphoric mood.       Objective:     Blood pressure rechecked by me:  148/78  Physical Exam  Constitutional: She appears well-developed and well-nourished. No distress.  HENT:  Nose: Nose normal.  Mouth/Throat: Oropharynx is clear and moist.  Neck: Neck supple. No thyromegaly present.  Cardiovascular: Normal rate and regular rhythm.   Pulmonary/Chest: Breath sounds normal. No respiratory distress. She has no wheezes.  Abdominal: Soft. Bowel sounds are normal. There is no tenderness.  Musculoskeletal: She exhibits no edema or tenderness.  Lymphadenopathy:    She has no cervical adenopathy.  Skin: No rash noted. No erythema.  Psychiatric: She has a normal mood and affect. Her behavior is normal.    BP (!) 154/64   Pulse 62   Temp 97.9 F (36.6 C) (Oral)   Ht 5\' 1"  (1.549 m)   Wt 140 lb 12.8 oz (63.9 kg)   SpO2 96%   BMI 26.60 kg/m  Wt Readings from Last 3 Encounters:  12/17/15 140 lb 12.8 oz (63.9 kg)  11/28/15 155 lb 9.6 oz (70.6 kg)  11/17/15 141 lb 9.6 oz (64.2 kg)     Lab Results  Component Value Date   WBC 9.3 11/25/2015   HGB 13.3 11/25/2015   HCT 38.5 11/25/2015   PLT 199 11/25/2015   GLUCOSE 135 (H) 11/25/2015   CHOL 162 10/13/2015   TRIG 112.0 10/13/2015   HDL 77.20 10/13/2015   LDLCALC 63 10/13/2015   ALT 20 11/24/2015   AST 28 11/24/2015   NA 139 11/25/2015   K 3.3 (L) 11/25/2015   CL 110 11/25/2015   CREATININE 0.70 11/25/2015   BUN 12 11/25/2015   CO2 20 (L) 11/25/2015   TSH 2.31 05/21/2015   INR 0.91 06/11/2015    Dg Abd 1 View  Result Date: 11/24/2015 CLINICAL DATA:  NG tube placement EXAM: ABDOMEN - 1 VIEW COMPARISON:  None. FINDINGS: NG tube coiled within hiatal hernia with tip cephalad about 4 cm above the left hemidiaphragm. IMPRESSION:  NG tube with tip within hiatal hernia. The NG tube should be repositioned and advanced into the stomach. Electronically Signed   By: Lahoma Crocker M.D.   On: 11/24/2015 16:37   Ct Abdomen Pelvis W Contrast  Result Date: 11/24/2015 CLINICAL DATA:  Generalized abdominal pain. Vomiting. History of small bowel obstruction EXAM: CT ABDOMEN AND PELVIS WITH CONTRAST TECHNIQUE: Multidetector CT imaging of the abdomen and pelvis was performed using the standard protocol following bolus administration of intravenous contrast. CONTRAST:  32mL ISOVUE-300 IOPAMIDOL (ISOVUE-300) INJECTION 61% COMPARISON:  06/11/2015 FINDINGS: Lower chest:  Moderate sliding hiatal hernia. Hepatobiliary: No focal liver abnormality.Cholecystectomy with chronic intra and extrahepatic bile duct dilatation. Bilirubin is normal today. Pancreas: Chronic dilatation of the main pancreatic duct to without visible mass or obstructive process. This finding is chronic since at least 2009. Spleen: Granulomatous calcifications. Adrenals/Urinary Tract: Negative adrenals. No hydronephrosis or stone. Left renal cyst with benign mural calcifications measuring up to 55 mm in the axial plane. Unremarkable bladder. Stomach/Bowel: Dilated fluid-filled small bowel with abrupt transition point in the right lower quadrant, occurring in the region of 2:48 to 56. In this region is angulated bowel, likely adhesive disease. No visible hernia or mass. Status post partial right colectomy with ileocolic anastomosis. There is a postoperative aneurysmal segment of small bowel in the right abdomen . Reactive congestion of the small bowel mesentery. No perforation or abscess. Vascular/Lymphatic: No acute vascular abnormality. Atherosclerotic calcification of the aorta and branch vessels. No mass or adenopathy. Reproductive:Hysterectomy. Other: Small reactive ascites. Musculoskeletal: Severe disc and facet degeneration with lumbar levoscoliosis. No acute or aggressive finding.  IMPRESSION: 1. High-grade small bowel obstruction with transition in the right lower quadrant, likely adhesions. 2. Partial right hemicolectomy. 3. Chronic biliary and pancreatic duct dilatation. 4. Moderate sliding hiatal hernia. Electronically Signed   By: Monte Fantasia M.D.   On: 11/24/2015 08:26   Dg Abd 2 Views  Result Date: 11/25/2015 CLINICAL DATA:  82 year old female with high-grade small bowel obstruction, transition in the right lower quadrant. Initial encounter. EXAM: ABDOMEN - 2 VIEW COMPARISON:  CT Abdomen and Pelvis 11/24/2015 and earlier. FINDINGS: Upright and supine views of the abdomen and pelvis. NG tube has been removed. Numerous surgical clips and staples in the right lower abdomen. No significant change in the bowel gas pattern since the CT on 11/24/2015 at which time the fluid-filled small bowel loops were radiographically occult. No pneumoperitoneum. Stable lung bases. Moderate to severe levoconvex lumbar scoliosis. Stable visualized osseous structures. IMPRESSION: 1. Bowel-gas pattern not significantly changed since the CT on 11/24/2015 at which time the fluid-filled dilated small bowel loops were radiographically occult. 2. No free air. Electronically Signed   By: Genevie Ann M.D.   On: 11/25/2015 09:21       Assessment & Plan:   Problem List Items Addressed This Visit    Chronic back pain    Stable.  Followed by Dr Sharlet Salina.        Hypercholesteremia    Low cholesterol diet and exercise.  Follow lipid panel.        Hypertension    Blood pressure still slightly elevated.  Tolerating current regimen.  Follow pressures.  Have them spot check her pressure.  See if levels out.  Follow. If persistent elevation, will adjust amlodipine.        Iron excess    Followed by hematology.       Small bowel obstruction    Recently admitted with partial bowel obstruction.  Doing better now.  Has seen GI.  Eating.  No nausea or vomiting.  Follow.            [name],  MD

## 2015-12-17 NOTE — Progress Notes (Signed)
Pre visit review using our clinic review tool, if applicable. No additional management support is needed unless otherwise documented below in the visit note. 

## 2015-12-20 ENCOUNTER — Encounter: Payer: Self-pay | Admitting: Internal Medicine

## 2015-12-20 ENCOUNTER — Other Ambulatory Visit: Payer: Self-pay | Admitting: Internal Medicine

## 2015-12-20 NOTE — Assessment & Plan Note (Signed)
Stable.  Followed by Dr Chasnis.   

## 2015-12-20 NOTE — Assessment & Plan Note (Signed)
Low cholesterol diet and exercise.  Follow lipid panel.   

## 2015-12-20 NOTE — Assessment & Plan Note (Signed)
Recently admitted with partial bowel obstruction.  Doing better now.  Has seen GI.  Eating.  No nausea or vomiting.  Follow.

## 2015-12-20 NOTE — Assessment & Plan Note (Signed)
Followed by hematology 

## 2015-12-20 NOTE — Assessment & Plan Note (Signed)
Blood pressure still slightly elevated.  Tolerating current regimen.  Follow pressures.  Have them spot check her pressure.  See if levels out.  Follow. If persistent elevation, will adjust amlodipine.

## 2015-12-25 ENCOUNTER — Telehealth: Payer: Self-pay | Admitting: *Deleted

## 2015-12-25 MED ORDER — AMLODIPINE BESYLATE 5 MG PO TABS: 5.0000 mg | ORAL_TABLET | Freq: Every day | ORAL | 1 refills | Status: DC

## 2015-12-25 NOTE — Telephone Encounter (Signed)
rx sent

## 2015-12-25 NOTE — Telephone Encounter (Signed)
Pt requested a medication refill for amlodipine  Pharmacy  [facility]

## 2015-12-29 ENCOUNTER — Other Ambulatory Visit: Payer: Self-pay

## 2015-12-29 MED ORDER — AMLODIPINE BESYLATE 5 MG PO TABS: 5.0000 mg | ORAL_TABLET | Freq: Every day | ORAL | 1 refills | Status: DC

## 2015-12-29 NOTE — Progress Notes (Signed)
Sent to pharmacy again.

## 2015-12-29 NOTE — Telephone Encounter (Signed)
Pt states that [facility] didn't received this RX.Marland Kitchen Can we please resend.Marland Kitchen

## 2016-01-05 ENCOUNTER — Inpatient Hospital Stay: Payer: Commercial Managed Care - HMO

## 2016-01-18 ENCOUNTER — Telehealth: Payer: Self-pay | Admitting: Internal Medicine

## 2016-01-18 DIAGNOSIS — K22719 Barrett's esophagus with dysplasia, unspecified: Secondary | ICD-10-CM

## 2016-01-18 NOTE — Telephone Encounter (Signed)
Patient calls states she is having pain in both legs from hips down.    She states she feels like legs are going to give away.   Wondering if it could be side effect from Zocor.  Please advise.   Also wondering is she get referral to [facility] to GI for her reflux please advise.

## 2016-01-18 NOTE — Telephone Encounter (Signed)
Pt wanted to get a referral for GI at [facility] and has a couple question about leg pain she thinks that  it maybe do to medication that she is taking.. Please advise [phone]

## 2016-01-18 NOTE — Telephone Encounter (Signed)
zocor can cause muscle aching, etc.  Have her stop the zocor and follow symptoms.  Unsure if medication or not.  If any acute symptoms or if symptoms persist, then needs to be seen.  Also, regarding her referral, is this to see Dr Tiffany Kocher?

## 2016-01-19 ENCOUNTER — Other Ambulatory Visit: Payer: Commercial Managed Care - HMO

## 2016-01-19 NOTE — Telephone Encounter (Signed)
Order placed for referral to [facility].  Someone from the office will be contacting her with an appt date and time.

## 2016-01-19 NOTE — Telephone Encounter (Signed)
Patient advised of below and verbalized understanding to stop Zocor.   She will call us in symptoms persist after discontinuing Zocor.   Patient states she would like to be referred to [facility] due to GI symptoms haven't been resolved, with reflux.   She is wanting to seek another opinion.  Please advise.

## 2016-01-21 ENCOUNTER — Other Ambulatory Visit: Payer: Self-pay | Admitting: Internal Medicine

## 2016-01-29 ENCOUNTER — Telehealth: Payer: Self-pay | Admitting: Internal Medicine

## 2016-01-29 NOTE — Telephone Encounter (Signed)
fyi

## 2016-01-29 NOTE — Telephone Encounter (Signed)
Pt had someone drop off her BP readings today. Thank you! It's in folder up front.

## 2016-02-02 ENCOUNTER — Other Ambulatory Visit: Payer: Commercial Managed Care - HMO

## 2016-02-10 DIAGNOSIS — Z1283 Encounter for screening for malignant neoplasm of skin: Secondary | ICD-10-CM | POA: Diagnosis not present

## 2016-02-10 DIAGNOSIS — D1800 Hemangioma unspecified site: Secondary | ICD-10-CM | POA: Diagnosis not present

## 2016-02-10 DIAGNOSIS — L700 Acne vulgaris: Secondary | ICD-10-CM | POA: Diagnosis not present

## 2016-02-10 DIAGNOSIS — D229 Melanocytic nevi, unspecified: Secondary | ICD-10-CM | POA: Diagnosis not present

## 2016-02-10 DIAGNOSIS — L578 Other skin changes due to chronic exposure to nonionizing radiation: Secondary | ICD-10-CM | POA: Diagnosis not present

## 2016-02-10 DIAGNOSIS — Z85828 Personal history of other malignant neoplasm of skin: Secondary | ICD-10-CM | POA: Diagnosis not present

## 2016-02-10 DIAGNOSIS — I8393 Asymptomatic varicose veins of bilateral lower extremities: Secondary | ICD-10-CM | POA: Diagnosis not present

## 2016-02-10 DIAGNOSIS — D692 Other nonthrombocytopenic purpura: Secondary | ICD-10-CM | POA: Diagnosis not present

## 2016-02-10 DIAGNOSIS — L821 Other seborrheic keratosis: Secondary | ICD-10-CM | POA: Diagnosis not present

## 2016-02-16 ENCOUNTER — Ambulatory Visit: Payer: Commercial Managed Care - HMO | Admitting: Internal Medicine

## 2016-02-16 ENCOUNTER — Other Ambulatory Visit: Payer: Commercial Managed Care - HMO

## 2016-02-23 DIAGNOSIS — M62830 Muscle spasm of back: Secondary | ICD-10-CM | POA: Diagnosis not present

## 2016-02-23 DIAGNOSIS — M5416 Radiculopathy, lumbar region: Secondary | ICD-10-CM | POA: Diagnosis not present

## 2016-02-23 DIAGNOSIS — M7062 Trochanteric bursitis, left hip: Secondary | ICD-10-CM | POA: Diagnosis not present

## 2016-02-23 DIAGNOSIS — M5136 Other intervertebral disc degeneration, lumbar region: Secondary | ICD-10-CM | POA: Diagnosis not present

## 2016-02-23 DIAGNOSIS — M7061 Trochanteric bursitis, right hip: Secondary | ICD-10-CM | POA: Diagnosis not present

## 2016-03-01 ENCOUNTER — Ambulatory Visit (INDEPENDENT_AMBULATORY_CARE_PROVIDER_SITE_OTHER): Payer: Medicare HMO | Admitting: Internal Medicine

## 2016-03-01 ENCOUNTER — Encounter: Payer: Self-pay | Admitting: Internal Medicine

## 2016-03-01 ENCOUNTER — Ambulatory Visit: Payer: Commercial Managed Care - HMO | Admitting: Oncology

## 2016-03-01 ENCOUNTER — Other Ambulatory Visit: Payer: Commercial Managed Care - HMO

## 2016-03-01 DIAGNOSIS — K56609 Unspecified intestinal obstruction, unspecified as to partial versus complete obstruction: Secondary | ICD-10-CM

## 2016-03-01 DIAGNOSIS — I10 Essential (primary) hypertension: Secondary | ICD-10-CM

## 2016-03-01 DIAGNOSIS — M545 Low back pain: Secondary | ICD-10-CM

## 2016-03-01 DIAGNOSIS — E7800 Pure hypercholesterolemia, unspecified: Secondary | ICD-10-CM | POA: Diagnosis not present

## 2016-03-01 DIAGNOSIS — M7989 Other specified soft tissue disorders: Secondary | ICD-10-CM | POA: Diagnosis not present

## 2016-03-01 DIAGNOSIS — G8929 Other chronic pain: Secondary | ICD-10-CM

## 2016-03-01 DIAGNOSIS — K22719 Barrett's esophagus with dysplasia, unspecified: Secondary | ICD-10-CM

## 2016-03-01 MED ORDER — AMLODIPINE BESYLATE 10 MG PO TABS: 10.0000 mg | ORAL_TABLET | Freq: Every day | ORAL | 3 refills | Status: DC

## 2016-03-01 NOTE — Progress Notes (Signed)
Pre-visit discussion using our clinic review tool. No additional management support is needed unless otherwise documented below in the visit note.  

## 2016-03-01 NOTE — Progress Notes (Signed)
Patient ID: [name], female   DOB: Jul 19, 1928, 83 y.o.   MRN: [id number]   Subjective:    Patient ID: [name], female    DOB: [birth year], 83 y.o.   MRN: [id number]  HPI  Patient here for a scheduled follow up. She is accompanied by her husband.  History obtained from both of them.  She reports she is doing relatively well.  Is eating.  No nausea or vomiting.  No abdominal pain or cramping.  Bowels stable.  No recent flare ups.  Blood pressure varying.  See attached list.  Still some increased readings 140-160.     Past Medical History:  Diagnosis Date  . Barrett's esophagus   . Bowel obstruction    s/p adhesion resection  . Chronic back pain   . Colon cancer ([place]) 1988 and 1989   adenocarcinoma, s/p resection x  2 and chemo  . Diverticulitis   . H/O open leg wound   . Hiatal hernia   . Hypercholesteremia   . Hypertension   . Lumbar scoliosis   . Peripheral neuropathy ([place])   . Recurrent sinus infections   . Renal cyst   . S/P chemotherapy, time since greater than 12 weeks    colon cancer  . Vertigo    Past Surgical History:  Procedure Laterality Date  . ABDOMINAL HYSTERECTOMY  1982  . adhesions resected     bowel obstruction  . APPENDECTOMY    . BREAST BIOPSY Left    negative 06/14/1985  . CHOLECYSTECTOMY    . COLON RESECTION     x2.  s/p colon cancer  . EXCISIONAL HEMORRHOIDECTOMY     with tubal ligation   Family History  Problem Relation Age of Onset  . Breast cancer Mother   . Asthma Mother   . Stroke Father   . Hypertension Father   . Diabetes Father   . Ovarian cancer Sister     x2  . Prostate cancer Brother   . Hypertension Brother     x3  . Hypercholesterolemia Brother     x3  . Diabetes Brother    Social History   Social History  . Marital status: Married    Spouse name: N/A  . Number of children: 4  . Years of education: 12th grade   Occupational History  . homemaker    Social History Main Topics  . Smoking status: Never  Smoker  . Smokeless tobacco: Never Used  . Alcohol use No  . Drug use: No  . Sexual activity: No   Other Topics Concern  . None   Social History Narrative  . None    Outpatient Encounter Prescriptions as of 03/01/2016  Medication Sig  . Calcium Carbonate-Vitamin D (CALCIUM 600 + D PO) Take 600 Units by mouth 2 (two) times daily with a meal.   . fexofenadine (ALLEGRA) 60 MG tablet TAKE 1 TABLET EVERY DAY  . fish oil-omega-3 fatty acids 1000 MG capsule Take 2 g by mouth daily.  Marland Kitchen losartan (COZAAR) 100 MG tablet Take 1 tablet (100 mg total) by mouth daily.  . meclizine (ANTIVERT) 25 MG tablet Take 25 mg by mouth 3 (three) times daily as needed.  . meloxicam (MOBIC) 7.5 MG tablet Take 7.5 mg by mouth daily.  . metoprolol succinate (TOPROL-XL) 25 MG 24 hr tablet TAKE 1 TABLET EVERY DAY  . ondansetron (ZOFRAN) 4 MG tablet Take 1 tablet (4 mg total) by mouth 2 (two) times daily as needed for  nausea or vomiting.  Marland Kitchen oxyCODONE (OXY IR/ROXICODONE) 5 MG immediate release tablet Take 5 mg by mouth 3 (three) times daily.   . pantoprazole (PROTONIX) 40 MG tablet Take 40 mg by mouth 2 (two) times daily.  . simvastatin (ZOCOR) 10 MG tablet TAKE 1 TABLET AT BEDTIME  . traMADol-acetaminophen (ULTRACET) 37.5-325 MG per tablet Take 1 tablet by mouth 2 (two) times daily as needed.   . [DISCONTINUED] amLODipine (NORVASC) 5 MG tablet TAKE 1 TABLET (5 MG TOTAL) BY MOUTH DAILY.  Marland Kitchen amLODipine (NORVASC) 10 MG tablet Take 1 tablet (10 mg total) by mouth daily.   No facility-administered encounter medications on file as of 03/01/2016.     Review of Systems  Constitutional: Negative for appetite change and unexpected weight change.  HENT: Negative for congestion and sinus pressure.   Respiratory: Negative for cough, chest tightness and shortness of breath.   Cardiovascular: Negative for chest pain, palpitations and leg swelling.  Gastrointestinal: Negative for abdominal pain, diarrhea, nausea and vomiting.    Genitourinary: Negative for difficulty urinating and dysuria.  Musculoskeletal: Positive for back pain. Negative for joint swelling.  Skin: Negative for color change and rash.  Neurological: Negative for dizziness, light-headedness and headaches.  Psychiatric/Behavioral: Negative for agitation and dysphoric mood.       Objective:    Physical Exam  Constitutional: She appears well-developed and well-nourished. No distress.  HENT:  Nose: Nose normal.  Mouth/Throat: Oropharynx is clear and moist.  Neck: Neck supple. No thyromegaly present.  Cardiovascular: Normal rate and regular rhythm.   Pulmonary/Chest: Breath sounds normal. No respiratory distress. She has no wheezes.  Abdominal: Soft. Bowel sounds are normal. There is no tenderness.  Musculoskeletal: She exhibits no edema or tenderness.  Lymphadenopathy:    She has no cervical adenopathy.  Skin: No rash noted. No erythema.  Psychiatric: She has a normal mood and affect. Her behavior is normal.    BP (!) 164/70 (BP Location: Right Arm, Patient Position: Sitting, Cuff Size: Normal)   Pulse (!) 59   Temp 97.6 F (36.4 C)   Resp 12   Ht 5\' 1"  (1.549 m)   Wt 138 lb 8 oz (62.8 kg)   SpO2 96%   BMI 26.17 kg/m  Wt Readings from Last 3 Encounters:  03/01/16 138 lb 8 oz (62.8 kg)  12/17/15 140 lb 12.8 oz (63.9 kg)  11/28/15 155 lb 9.6 oz (70.6 kg)     Lab Results  Component Value Date   WBC 9.3 11/25/2015   HGB 13.3 11/25/2015   HCT 38.5 11/25/2015   PLT 199 11/25/2015   GLUCOSE 135 (H) 11/25/2015   CHOL 162 10/13/2015   TRIG 112.0 10/13/2015   HDL 77.20 10/13/2015   LDLCALC 63 10/13/2015   ALT 20 11/24/2015   AST 28 11/24/2015   NA 139 11/25/2015   K 3.3 (L) 11/25/2015   CL 110 11/25/2015   CREATININE 0.70 11/25/2015   BUN 12 11/25/2015   CO2 20 (L) 11/25/2015   TSH 2.31 05/21/2015   INR 0.91 06/11/2015    Dg Abd 1 View  Result Date: 11/24/2015 CLINICAL DATA:  NG tube placement EXAM: ABDOMEN - 1 VIEW  COMPARISON:  None. FINDINGS: NG tube coiled within hiatal hernia with tip cephalad about 4 cm above the left hemidiaphragm. IMPRESSION: NG tube with tip within hiatal hernia. The NG tube should be repositioned and advanced into the stomach. Electronically Signed   By: Lahoma Crocker M.D.   On: 11/24/2015 16:37  Ct Abdomen Pelvis W Contrast  Result Date: 11/24/2015 CLINICAL DATA:  Generalized abdominal pain. Vomiting. History of small bowel obstruction EXAM: CT ABDOMEN AND PELVIS WITH CONTRAST TECHNIQUE: Multidetector CT imaging of the abdomen and pelvis was performed using the standard protocol following bolus administration of intravenous contrast. CONTRAST:  50mL ISOVUE-300 IOPAMIDOL (ISOVUE-300) INJECTION 61% COMPARISON:  06/11/2015 FINDINGS: Lower chest:  Moderate sliding hiatal hernia. Hepatobiliary: No focal liver abnormality.Cholecystectomy with chronic intra and extrahepatic bile duct dilatation. Bilirubin is normal today. Pancreas: Chronic dilatation of the main pancreatic duct to without visible mass or obstructive process. This finding is chronic since at least 2009. Spleen: Granulomatous calcifications. Adrenals/Urinary Tract: Negative adrenals. No hydronephrosis or stone. Left renal cyst with benign mural calcifications measuring up to 55 mm in the axial plane. Unremarkable bladder. Stomach/Bowel: Dilated fluid-filled small bowel with abrupt transition point in the right lower quadrant, occurring in the region of 2:48 to 56. In this region is angulated bowel, likely adhesive disease. No visible hernia or mass. Status post partial right colectomy with ileocolic anastomosis. There is a postoperative aneurysmal segment of small bowel in the right abdomen . Reactive congestion of the small bowel mesentery. No perforation or abscess. Vascular/Lymphatic: No acute vascular abnormality. Atherosclerotic calcification of the aorta and branch vessels. No mass or adenopathy. Reproductive:Hysterectomy. Other:  Small reactive ascites. Musculoskeletal: Severe disc and facet degeneration with lumbar levoscoliosis. No acute or aggressive finding. IMPRESSION: 1. High-grade small bowel obstruction with transition in the right lower quadrant, likely adhesions. 2. Partial right hemicolectomy. 3. Chronic biliary and pancreatic duct dilatation. 4. Moderate sliding hiatal hernia. Electronically Signed   By: Monte Fantasia M.D.   On: 11/24/2015 08:26   Dg Abd 2 Views  Result Date: 11/25/2015 CLINICAL DATA:  83 year old female with high-grade small bowel obstruction, transition in the right lower quadrant. Initial encounter. EXAM: ABDOMEN - 2 VIEW COMPARISON:  CT Abdomen and Pelvis 11/24/2015 and earlier. FINDINGS: Upright and supine views of the abdomen and pelvis. NG tube has been removed. Numerous surgical clips and staples in the right lower abdomen. No significant change in the bowel gas pattern since the CT on 11/24/2015 at which time the fluid-filled small bowel loops were radiographically occult. No pneumoperitoneum. Stable lung bases. Moderate to severe levoconvex lumbar scoliosis. Stable visualized osseous structures. IMPRESSION: 1. Bowel-gas pattern not significantly changed since the CT on 11/24/2015 at which time the fluid-filled dilated small bowel loops were radiographically occult. 2. No free air. Electronically Signed   By: Genevie Ann M.D.   On: 11/25/2015 09:21       Assessment & Plan:   Problem List Items Addressed This Visit    Barrett's esophagus    Followed by GI.  On protonix.       Chronic back pain    Seeing Dr Sharlet Salina.        Hemochromatosis    Followed by hematology.       Hypercholesteremia    On simvastatin.  Low cholesterol diet and exercise.  Follow lipid panel and liver function tests.        Relevant Medications   amLODipine (NORVASC) 10 MG tablet   Other Relevant Orders   Lipid panel   Hepatic function panel   Hypertension    Blood pressure remains elevated.  Increase  amlodipine to 10mg  q day.  Follow pressures.  Follow metabolic panel.        Relevant Medications   amLODipine (NORVASC) 10 MG tablet   Other Relevant Orders  TSH   Basic metabolic panel   Small bowel obstruction    No further problems since her last admission.  Follow.       Swelling of left lower extremity    Improved with compression hose.  Follow.            [name], MD

## 2016-03-03 ENCOUNTER — Encounter: Payer: Self-pay | Admitting: Internal Medicine

## 2016-03-03 NOTE — Assessment & Plan Note (Signed)
On simvastatin.  Low cholesterol diet and exercise.  Follow lipid panel and liver function tests.   

## 2016-03-03 NOTE — Assessment & Plan Note (Signed)
Followed by GI.  On protonix.

## 2016-03-03 NOTE — Assessment & Plan Note (Signed)
Improved with compression hose.  Follow.   

## 2016-03-03 NOTE — Assessment & Plan Note (Signed)
Blood pressure remains elevated.  Increase amlodipine to 10mg q day.  Follow pressures.  Follow metabolic panel.   

## 2016-03-03 NOTE — Assessment & Plan Note (Signed)
Seeing Dr Chasnis.   

## 2016-03-03 NOTE — Assessment & Plan Note (Signed)
No further problems since her last admission.  Follow.

## 2016-03-03 NOTE — Assessment & Plan Note (Signed)
Followed by hematology 

## 2016-03-18 ENCOUNTER — Telehealth: Payer: Self-pay

## 2016-03-18 NOTE — Telephone Encounter (Signed)
Pt dropped off sheet with PB readings put in you folder for review.

## 2016-03-30 ENCOUNTER — Ambulatory Visit (INDEPENDENT_AMBULATORY_CARE_PROVIDER_SITE_OTHER): Payer: Medicare HMO

## 2016-03-30 VITALS — BP 128/72 | HR 66 | Temp 97.8°F | Resp 14 | Ht 61.0 in | Wt 136.4 lb

## 2016-03-30 DIAGNOSIS — Z0000 Encounter for general adult medical examination without abnormal findings: Secondary | ICD-10-CM

## 2016-03-30 NOTE — Progress Notes (Signed)
Subjective:   [name] is a 83 y.o. female who presents for Medicare Annual (Subsequent) preventive examination.  Review of Systems:  No ROS.  Medicare Wellness Visit.  Cardiac Risk Factors include: advanced age (>65men, >33 women);hypertension     Objective:     Vitals: BP 128/72 (BP Location: Left Arm, Patient Position: Sitting, Cuff Size: Normal)   Pulse 66   Temp 97.8 F (36.6 C) (Oral)   Resp 14   Ht 5\' 1"  (1.549 m)   Wt 136 lb 6.4 oz (61.9 kg)   SpO2 97%   BMI 25.77 kg/m   Body mass index is 25.77 kg/m.   Tobacco History  Smoking Status  . Never Smoker  Smokeless Tobacco  . Never Used     Counseling given: Not Answered   Past Medical History:  Diagnosis Date  . Barrett's esophagus   . Bowel obstruction    s/p adhesion resection  . Chronic back pain   . Colon cancer ([place]) 1988 and 1989   adenocarcinoma, s/p resection x  2 and chemo  . Diverticulitis   . H/O open leg wound   . Hiatal hernia   . Hypercholesteremia   . Hypertension   . Lumbar scoliosis   . Peripheral neuropathy ([place])   . Recurrent sinus infections   . Renal cyst   . S/P chemotherapy, time since greater than 12 weeks    colon cancer  . Vertigo    Past Surgical History:  Procedure Laterality Date  . ABDOMINAL HYSTERECTOMY  1982  . adhesions resected     bowel obstruction  . APPENDECTOMY    . BREAST BIOPSY Left    negative 06/14/1985  . CHOLECYSTECTOMY    . COLON RESECTION     x2.  s/p colon cancer  . EXCISIONAL HEMORRHOIDECTOMY     with tubal ligation   Family History  Problem Relation Age of Onset  . Breast cancer Mother   . Asthma Mother   . Stroke Father   . Hypertension Father   . Diabetes Father   . Ovarian cancer Sister     x2  . Prostate cancer Brother   . Hypertension Brother     x3  . Hypercholesterolemia Brother     x3  . Diabetes Brother    History  Sexual Activity  . Sexual activity: No    Outpatient Encounter Prescriptions as of 03/30/2016    Medication Sig  . amLODipine (NORVASC) 10 MG tablet Take 1 tablet (10 mg total) by mouth daily.  . Calcium Carbonate-Vitamin D (CALCIUM 600 + D PO) Take 600 Units by mouth 2 (two) times daily with a meal.   . fexofenadine (ALLEGRA) 60 MG tablet TAKE 1 TABLET EVERY DAY  . fish oil-omega-3 fatty acids 1000 MG capsule Take 2 g by mouth daily.  Marland Kitchen losartan (COZAAR) 100 MG tablet Take 1 tablet (100 mg total) by mouth daily.  . meclizine (ANTIVERT) 25 MG tablet Take 25 mg by mouth 3 (three) times daily as needed.  . meloxicam (MOBIC) 7.5 MG tablet Take 7.5 mg by mouth daily.  . metoprolol succinate (TOPROL-XL) 25 MG 24 hr tablet TAKE 1 TABLET EVERY DAY  . ondansetron (ZOFRAN) 4 MG tablet Take 1 tablet (4 mg total) by mouth 2 (two) times daily as needed for nausea or vomiting.  Marland Kitchen oxyCODONE (OXY IR/ROXICODONE) 5 MG immediate release tablet Take 5 mg by mouth 3 (three) times daily.   . pantoprazole (PROTONIX) 40 MG tablet Take  40 mg by mouth 2 (two) times daily.  . simvastatin (ZOCOR) 10 MG tablet TAKE 1 TABLET AT BEDTIME  . traMADol-acetaminophen (ULTRACET) 37.5-325 MG per tablet Take 1 tablet by mouth 2 (two) times daily as needed.    No facility-administered encounter medications on file as of 03/30/2016.     Activities of Daily Living In your present state of health, do you have any difficulty performing the following activities: 03/30/2016 11/24/2015  Hearing? N N  Vision? N N  Difficulty concentrating or making decisions? N N  Walking or climbing stairs? Y N  Dressing or bathing? Y N  Doing errands, shopping? Y N  Preparing Food and eating ? Y -  Using the Toilet? N -  In the past six months, have you accidently leaked urine? N -  Do you have problems with loss of bowel control? N -  Managing your Medications? Y -  Managing your Finances? Y -  Housekeeping or managing your Housekeeping? Y -  Some recent data might be hidden    Patient Care Team: [name], MD as PCP - General  (Internal Medicine)    Assessment:    This is a routine wellness examination for Chrissa. The goal of the wellness visit is to assist the patient how to close the gaps in care and create a preventative care plan for the patient.   Taking calcium VIT D as appropriate/Osteoporosis risk reviewed.  Medications reviewed; taking without issues or barriers.  Safety issues reviewed; smoke detectors in the home. No firearms in the home. Wears seatbelts when riding with others. No violence in the home. Walker in use when ambulating.  No identified risk were noted; The patient was oriented x 3; appropriate in dress and manner and no objective failures at ADL's or IADL's. Husband assists as needed.  BMI; discussed the importance of a healthy diet, water intake and exercise. Educational material provided.  HTN; followed by PCP.  DEXA Scan discussed; educational material provided. Deferred for follow up with PCP per patient request.  Patient Concerns: None at this time. Follow up with PCP as needed.  Exercise Activities and Dietary recommendations Current Exercise Habits: The patient does not participate in regular exercise at present  Goals    . Healthy Lifestyle          Stay active and do chair exercises as demonstrated  Stay hydrated, drink plenty of water Low carb foods      Fall Risk Fall Risk  03/30/2016 12/17/2015 11/17/2015 03/31/2015 10/23/2014  Falls in the past year? No No No No No   Depression Screen PHQ 2/9 Scores 03/30/2016 12/17/2015 11/17/2015 03/31/2015  PHQ - 2 Score 0 0 0 0     Cognitive Function MMSE - Mini Mental State Exam 03/30/2016 03/31/2015  Orientation to time 5 5  Orientation to Place 5 5  Registration 3 3  Attention/ Calculation 5 5  Recall 3 3  Language- name 2 objects 2 2  Language- repeat 1 1  Language- follow 3 step command 3 3  Language- read & follow direction 1 1  Write a sentence 1 1  Copy design 1 1  Total score 30 30         Immunization History  Administered Date(s) Administered  . Influenza Split 12/08/2011  . Influenza, High Dose Seasonal PF 11/17/2015  . Influenza,inj,Quad PF,36+ Mos 10/31/2013, 10/23/2014  . Influenza-Unspecified 11/17/2015  . Pneumococcal Conjugate-13 05/07/2013  . Pneumococcal-Unspecified 11/30/2000, 01/04/2006  . Td 11/20/2007   Screening  Tests Health Maintenance  Topic Date Due  . DEXA SCAN  02/05/1996  . MAMMOGRAM  11/02/2016  . TETANUS/TDAP  11/19/2017  . INFLUENZA VACCINE  Completed  . ZOSTAVAX  Addressed  . PNA vac Low Risk Adult  Completed      Plan:    End of life planning; Advance aging; Advanced directives discussed. Copy of current HCPOA/Living Will requested.  Medicare Attestation I have personally reviewed: The patient's medical and social history Their use of alcohol, tobacco or illicit drugs Their current medications and supplements The patient's functional ability including ADLs,fall risks, home safety risks, cognitive, and hearing and visual impairment Diet and physical activities Evidence for depression   The patient's weight, height, BMI, and visual acuity have been recorded in the chart.  I have made referrals and provided education to the patient based on review of the above and I have provided the patient with a written personalized care plan for preventive services.    During the course of the visit the patient was educated and counseled about the following appropriate screening and preventive services:   Vaccines to include Pneumoccal, Influenza, Hepatitis B, Td, Zostavax, HCV  Electrocardiogram  Cardiovascular Disease  Colorectal cancer screening  Bone density screening  Diabetes screening  Glaucoma screening  Mammography/PAP  Nutrition counseling   Patient Instructions (the written plan) was given to the patient.   Varney Biles, LPN  [id number]   Reviewed above information.  Agree with plan.  Dr Nicki Reaper

## 2016-03-30 NOTE — Patient Instructions (Addendum)
  [name] , Thank you for taking time to come for your Medicare Wellness Visit. I appreciate your ongoing commitment to your health goals. Please review the following plan we discussed and let me know if I can assist you in the future.   Follow up with Dr. Nicki Reaper as needed.  These are the goals we discussed: Goals    . Healthy Lifestyle          Stay active and do chair exercises as demonstrated  Stay hydrated, drink plenty of water Low carb foods       This is a list of the screening recommended for you and due dates:  Health Maintenance  Topic Date Due  . DEXA scan (bone density measurement)  02/05/1996  . Mammogram  11/02/2016  . Tetanus Vaccine  11/19/2017  . Flu Shot  Completed  . Shingles Vaccine  Addressed  . Pneumonia vaccines  Completed

## 2016-03-31 ENCOUNTER — Other Ambulatory Visit: Payer: Self-pay | Admitting: Internal Medicine

## 2016-04-12 ENCOUNTER — Encounter: Payer: Self-pay | Admitting: Emergency Medicine

## 2016-04-12 ENCOUNTER — Inpatient Hospital Stay
Admission: EM | Admit: 2016-04-12 | Discharge: 2016-04-14 | DRG: 389 | Disposition: A | Discharge status: Home / Self Care | Payer: Medicare HMO | Attending: Internal Medicine | Admitting: Internal Medicine

## 2016-04-12 DIAGNOSIS — K56600 Partial intestinal obstruction, unspecified as to cause: Secondary | ICD-10-CM | POA: Diagnosis not present

## 2016-04-12 DIAGNOSIS — N179 Acute kidney failure, unspecified: Secondary | ICD-10-CM | POA: Diagnosis present

## 2016-04-12 DIAGNOSIS — K449 Diaphragmatic hernia without obstruction or gangrene: Secondary | ICD-10-CM | POA: Diagnosis present

## 2016-04-12 DIAGNOSIS — Z823 Family history of stroke: Secondary | ICD-10-CM | POA: Diagnosis not present

## 2016-04-12 DIAGNOSIS — Z90710 Acquired absence of both cervix and uterus: Secondary | ICD-10-CM | POA: Diagnosis not present

## 2016-04-12 DIAGNOSIS — Z8249 Family history of ischemic heart disease and other diseases of the circulatory system: Secondary | ICD-10-CM

## 2016-04-12 DIAGNOSIS — I10 Essential (primary) hypertension: Secondary | ICD-10-CM | POA: Diagnosis not present

## 2016-04-12 DIAGNOSIS — K56609 Unspecified intestinal obstruction, unspecified as to partial versus complete obstruction: Secondary | ICD-10-CM

## 2016-04-12 DIAGNOSIS — R1110 Vomiting, unspecified: Secondary | ICD-10-CM | POA: Diagnosis not present

## 2016-04-12 DIAGNOSIS — Z8349 Family history of other endocrine, nutritional and metabolic diseases: Secondary | ICD-10-CM | POA: Diagnosis not present

## 2016-04-12 DIAGNOSIS — Z825 Family history of asthma and other chronic lower respiratory diseases: Secondary | ICD-10-CM | POA: Diagnosis not present

## 2016-04-12 DIAGNOSIS — Z791 Long term (current) use of non-steroidal anti-inflammatories (NSAID): Secondary | ICD-10-CM | POA: Diagnosis not present

## 2016-04-12 DIAGNOSIS — Z8041 Family history of malignant neoplasm of ovary: Secondary | ICD-10-CM | POA: Diagnosis not present

## 2016-04-12 DIAGNOSIS — Z803 Family history of malignant neoplasm of breast: Secondary | ICD-10-CM | POA: Diagnosis not present

## 2016-04-12 DIAGNOSIS — E7800 Pure hypercholesterolemia, unspecified: Secondary | ICD-10-CM | POA: Diagnosis present

## 2016-04-12 DIAGNOSIS — Z9221 Personal history of antineoplastic chemotherapy: Secondary | ICD-10-CM | POA: Diagnosis not present

## 2016-04-12 DIAGNOSIS — R109 Unspecified abdominal pain: Secondary | ICD-10-CM

## 2016-04-12 DIAGNOSIS — E1165 Type 2 diabetes mellitus with hyperglycemia: Secondary | ICD-10-CM | POA: Diagnosis present

## 2016-04-12 DIAGNOSIS — E785 Hyperlipidemia, unspecified: Secondary | ICD-10-CM | POA: Diagnosis not present

## 2016-04-12 DIAGNOSIS — R1084 Generalized abdominal pain: Secondary | ICD-10-CM | POA: Diagnosis not present

## 2016-04-12 DIAGNOSIS — G8929 Other chronic pain: Secondary | ICD-10-CM | POA: Diagnosis present

## 2016-04-12 DIAGNOSIS — Z85038 Personal history of other malignant neoplasm of large intestine: Secondary | ICD-10-CM | POA: Diagnosis not present

## 2016-04-12 DIAGNOSIS — G629 Polyneuropathy, unspecified: Secondary | ICD-10-CM | POA: Diagnosis not present

## 2016-04-12 DIAGNOSIS — M549 Dorsalgia, unspecified: Secondary | ICD-10-CM | POA: Diagnosis present

## 2016-04-12 DIAGNOSIS — Z9049 Acquired absence of other specified parts of digestive tract: Secondary | ICD-10-CM

## 2016-04-12 DIAGNOSIS — D72829 Elevated white blood cell count, unspecified: Secondary | ICD-10-CM | POA: Diagnosis not present

## 2016-04-12 DIAGNOSIS — Z833 Family history of diabetes mellitus: Secondary | ICD-10-CM

## 2016-04-12 DIAGNOSIS — R1010 Upper abdominal pain, unspecified: Secondary | ICD-10-CM | POA: Diagnosis not present

## 2016-04-12 LAB — CBC WITH DIFFERENTIAL/PLATELET
Basophils Absolute: 0.2 10*3/uL — ABNORMAL HIGH (ref 0–0.1)
Basophils Relative: 1 %
EOS PCT: 1 %
Eosinophils Absolute: 0.2 10*3/uL (ref 0–0.7)
HEMATOCRIT: 41.4 % (ref 35.0–47.0)
Hemoglobin: 14.4 g/dL (ref 12.0–16.0)
LYMPHS PCT: 20 %
Lymphs Abs: 3.3 10*3/uL (ref 1.0–3.6)
MCH: 32.1 pg (ref 26.0–34.0)
MCHC: 34.8 g/dL (ref 32.0–36.0)
MCV: 92.3 fL (ref 80.0–100.0)
MONO ABS: 0.8 10*3/uL (ref 0.2–0.9)
MONOS PCT: 5 %
NEUTROS ABS: 12 10*3/uL — AB (ref 1.4–6.5)
Neutrophils Relative %: 73 %
PLATELETS: 290 10*3/uL (ref 150–440)
RBC: 4.49 MIL/uL (ref 3.80–5.20)
RDW: 13.6 % (ref 11.5–14.5)
WBC: 16.5 10*3/uL — ABNORMAL HIGH (ref 3.6–11.0)

## 2016-04-12 MED ORDER — MORPHINE SULFATE (PF) 2 MG/ML IV SOLN
2.0000 mg | Freq: Once | INTRAVENOUS | Status: AC
  Administered 2016-04-12: 2 mg via INTRAVENOUS

## 2016-04-12 MED ORDER — ONDANSETRON HCL 4 MG/2ML IJ SOLN
4.0000 mg | Freq: Once | INTRAMUSCULAR | Status: AC
  Administered 2016-04-12: 4 mg via INTRAVENOUS

## 2016-04-12 MED ORDER — ONDANSETRON HCL 4 MG/2ML IJ SOLN
INTRAMUSCULAR | Status: AC
  Administered 2016-04-12: 4 mg via INTRAVENOUS
  Filled 2016-04-12: qty 2

## 2016-04-12 MED ORDER — MORPHINE SULFATE (PF) 2 MG/ML IV SOLN
INTRAVENOUS | Status: AC
  Administered 2016-04-12: 2 mg via INTRAVENOUS
  Filled 2016-04-12: qty 1

## 2016-04-12 NOTE — ED Triage Notes (Signed)
Pt arrived to the ED for complaints of abdominal pain x1 day with vomiting. Pt reports that it feels like the last time she had a intestinal blockage. Pt is AOx4 in mild pain distress actively vomiting.

## 2016-04-12 NOTE — ED Provider Notes (Signed)
[facility] Emergency Department Provider Note   First MD Initiated Contact with Patient 04/12/16 2330     (approximate)  I have reviewed the triage vital signs and the nursing notes.   HISTORY  Chief Complaint Abdominal Pain and Emesis    HPI [name] is a 83 y.o. female with below list of chronic medical conditions including colon cancer with multiple bowel structure presents to the emergency department with acute onset of generalized abdominal discomfort and vomiting since 9 PM tonight. Patient states her current pain score is 10 out of 10. Patient actively vomiting on my arrival to room   Past Medical History:  Diagnosis Date  . Barrett's esophagus   . Bowel obstruction    s/p adhesion resection  . Chronic back pain   . Colon cancer ([place]) 1988 and 1989   adenocarcinoma, s/p resection x  2 and chemo  . Diverticulitis   . H/O open leg wound   . Hiatal hernia   . Hypercholesteremia   . Hypertension   . Lumbar scoliosis   . Peripheral neuropathy ([place])   . Recurrent sinus infections   . Renal cyst   . S/P chemotherapy, time since greater than 12 weeks    colon cancer  . Vertigo     Patient Active Problem List   Diagnosis Date Noted  . AKI (acute kidney injury) ([place]) 04/13/2016  . Small bowel obstruction 11/24/2015  . Swelling of left lower extremity 07/16/2015  . Acute Ileitis 06/12/2015  . Hypokalemia 06/12/2015  . Accelerated hypertension 06/12/2015  . Hemochromatosis 05/25/2015  . Leg wound, right 05/19/2015  . Bilateral lower extremity edema 05/19/2015  . Iron excess 04/16/2015  . URI (upper respiratory infection) 01/18/2015  . Dizziness 10/26/2014  . Hyperbilirubinemia 10/26/2014  . Health care maintenance 05/18/2014  . History of pneumonia 05/18/2014  . Anemia, iron deficiency 04/21/2014  . Hospital discharge follow-up 04/14/2014  . Sweats, menopausal 03/07/2014  . Face lesion 11/03/2013  . Numbness in feet 10/08/2012    . Nocturia 10/08/2012  . Hypertension 12/18/2011  . Hypercholesteremia 12/18/2011  . History of colon cancer 12/18/2011  . Barrett's esophagus 12/18/2011  . Chronic back pain 12/18/2011  . Osteopenia 12/18/2011    Past Surgical History:  Procedure Laterality Date  . ABDOMINAL HYSTERECTOMY  1982  . adhesions resected     bowel obstruction  . APPENDECTOMY    . BREAST BIOPSY Left    negative 06/14/1985  . CHOLECYSTECTOMY    . COLON RESECTION     x2.  s/p colon cancer  . EXCISIONAL HEMORRHOIDECTOMY     with tubal ligation    Prior to Admission medications   Medication Sig Start Date End Date Taking? Authorizing Provider  amLODipine (NORVASC) 10 MG tablet Take 1 tablet (10 mg total) by mouth daily. 03/01/16  Yes [name], MD  Calcium Carbonate-Vitamin D (CALCIUM 600 + D PO) Take 600 Units by mouth 2 (two) times daily with a meal.    Yes Historical Provider, MD  fexofenadine (ALLEGRA) 60 MG tablet TAKE 1 TABLET EVERY DAY 11/10/15  Yes [name], MD  fish oil-omega-3 fatty acids 1000 MG capsule Take 2 g by mouth daily.   Yes Historical Provider, MD  losartan (COZAAR) 100 MG tablet TAKE 1 TABLET EVERY DAY 04/01/16  Yes [name], MD  meclizine (ANTIVERT) 25 MG tablet Take 25 mg by mouth 3 (three) times daily as needed.   Yes Historical Provider, MD  meloxicam (MOBIC) 7.5  MG tablet Take 7.5 mg by mouth daily.   Yes Historical Provider, MD  metoprolol succinate (TOPROL-XL) 25 MG 24 hr tablet TAKE 1 TABLET EVERY DAY 12/21/15  Yes [name], MD  oxyCODONE (OXY IR/ROXICODONE) 5 MG immediate release tablet Take 5 mg by mouth 3 (three) times daily.    Yes Historical Provider, MD  pantoprazole (PROTONIX) 40 MG tablet Take 40 mg by mouth 2 (two) times daily.   Yes Historical Provider, MD  simvastatin (ZOCOR) 10 MG tablet TAKE 1 TABLET AT BEDTIME 04/01/16  Yes [name], MD  traMADol-acetaminophen (ULTRACET) 37.5-325 MG per tablet Take 1 tablet by mouth 2 (two) times daily as  needed.    Yes Historical Provider, MD  ondansetron (ZOFRAN) 4 MG tablet Take 1 tablet (4 mg total) by mouth 2 (two) times daily as needed for nausea or vomiting. 06/25/15   [name], MD    Allergies No known drug allergies  Family History  Problem Relation Age of Onset  . Breast cancer Mother   . Asthma Mother   . Stroke Father   . Hypertension Father   . Diabetes Father   . Ovarian cancer Sister     x2  . Prostate cancer Brother   . Hypertension Brother     x3  . Hypercholesterolemia Brother     x3  . Diabetes Brother     Social History Social History  Substance Use Topics  . Smoking status: Never Smoker  . Smokeless tobacco: Never Used  . Alcohol use No    Review of Systems Constitutional: No fever/chills Eyes: No visual changes. ENT: No sore throat. Cardiovascular: Denies chest pain. Respiratory: Denies shortness of breath. Gastrointestinal: Positive for abdominal pain and vomiting  Genitourinary: Negative for dysuria. Musculoskeletal: Negative for back pain. Skin: Negative for rash. Neurological: Negative for headaches, focal weakness or numbness.  10-point ROS otherwise negative.  ____________________________________________   PHYSICAL EXAM:  VITAL SIGNS: ED Triage Vitals  Enc Vitals Group     BP 04/12/16 2345 (!) 203/76     Pulse Rate 04/12/16 2345 98     Resp 04/12/16 2345 18     Temp 04/12/16 2345 98.6 F (37 C)     Temp Source 04/12/16 2345 Oral     SpO2 04/12/16 2345 98 %     Weight 04/12/16 2346 136 lb (61.7 kg)     Height 04/12/16 2346 5\' 1"  (1.549 m)     Head Circumference --      Peak Flow --      Pain Score 04/12/16 2346 10     Pain Loc --      Pain Edu? --      Excl. in [place]? --     Constitutional: Alert and oriented.Apparent discomfort Eyes: Conjunctivae are normal. PERRL. EOMI. Head: Atraumatic. Mouth/Throat: Mucous membranes are moist.  Oropharynx non-erythematous. Neck: No stridor.   Cardiovascular: Normal rate,  regular rhythm. Good peripheral circulation. Grossly normal heart sounds. Respiratory: Normal respiratory effort.  No retractions. Lungs CTAB. Gastrointestinal: Generalized abdominal discomfort worse in the right upper and lower quadrant. Positive for abdominal distention Musculoskeletal: No lower extremity tenderness nor edema. No gross deformities of extremities. Neurologic:  Normal speech and language. No gross focal neurologic deficits are appreciated.  Skin:  Skin is warm, dry and intact. No rash noted. Psychiatric: Mood and affect are normal. Speech and behavior are normal.  ____________________________________________   LABS (all labs ordered are listed, but only abnormal results are displayed)  Labs Reviewed  CBC WITH DIFFERENTIAL/PLATELET - Abnormal; Notable for the following:       Result Value   WBC 16.5 (*)    Neutro Abs 12.0 (*)    Basophils Absolute 0.2 (*)    All other components within normal limits  COMPREHENSIVE METABOLIC PANEL - Abnormal; Notable for the following:    Glucose, Bld 128 (*)    BUN 34 (*)    Creatinine, Ser 1.20 (*)    Calcium 10.4 (*)    Total Bilirubin 1.3 (*)    GFR calc non Af Amer 40 (*)    GFR calc Af Amer 46 (*)    All other components within normal limits  LIPASE, BLOOD   ____________________________________________  EKG  ED ECG REPORT I, [place] N Taytum Scheck, the attending physician, personally viewed and interpreted this ECG.   Date: 04/13/2016  EKG Time: 11:52 PM  Rate: 81  Rhythm: Normal sinus rhythm  Axis: Left axis deviation  Intervals: Prolonged PR interval  ST&T Change: None  ____________________________________________  RADIOLOGY I, [place] N Remigio Mcmillon, personally viewed and evaluated these images (plain radiographs) as part of my medical decision making, as well as reviewing the written report by the radiologist.  Ct Abdomen Pelvis W Contrast  Result Date: 04/13/2016 CLINICAL DATA:  Abdominal pain for 1 day.  Vomiting.  EXAM: CT ABDOMEN AND PELVIS WITH CONTRAST TECHNIQUE: Multidetector CT imaging of the abdomen and pelvis was performed using the standard protocol following bolus administration of intravenous contrast. CONTRAST:  2mL ISOVUE-300 IOPAMIDOL (ISOVUE-300) INJECTION 61% COMPARISON:  CT 11/24/2015 FINDINGS: Lower chest: No consolidation or pleural fluid. Moderate hiatal hernia. Hepatobiliary: Chronic biliary dilatation with common bile duct measuring 12 mm at the porta hepatis. This is unchanged from prior exam. Postcholecystectomy. No focal hepatic lesion. Pancreas: Chronic proximal pancreatic ductal dilatation measuring up to 7 mm. No peripancreatic inflammation. No evidence pancreatitis. Spleen: Splenic granuloma.  Normal in size. Adrenals/Urinary Tract: Normal adrenal glands. No hydronephrosis. Symmetric renal enhancement and excretion on delayed phase imaging. Unchanged cyst in the upper left kidney measuring 5.3 cm with peripheral calcification. Scattered cortical hypodensities throughout both kidneys. Urinary bladder is physiologically distended without wall thickening. Stomach/Bowel: Moderate hiatal hernia. Stomach physiologically distended with enteric contrast. Distal small bowel loops are fluid-filled and borderline dilated. Fluid within the transverse transverse colon, post right hemicolectomy. Moderate stool in the more distal colon. Minimal mesenteric edema of fluid-filled lower bowel loops, no free fluid. No pneumatosis. Vascular/Lymphatic: Aortic atherosclerosis and tortuosity. No aneurysm no adenopathy. Reproductive: Status post hysterectomy. No adnexal masses. Other: No free air.  No intra-abdominal abscess. Musculoskeletal: Scoliotic curvature of the spine with degenerative change. There are no acute or suspicious osseous abnormalities. IMPRESSION: 1. Fluid-filled distal small bowel which is borderline dilated. Additionally there is fluid in the proximal colon to the mid transverse, with moderate stool  distally. Overall findings suggest generalized ileus and slow transit, less likely early small bowel obstruction. The discrete transition seen on prior exam is not present currently. 2. Chronic pancreatic and biliary ductal dilatation, stable. 3. Moderate hiatal hernia. Electronically Signed   By: Jeb Levering M.D.   On: 04/13/2016 01:54     Procedures      INITIAL IMPRESSION / ASSESSMENT AND PLAN / ED COURSE  Pertinent labs & imaging results that were available during my care of the patient were reviewed by me and considered in my medical decision making (see chart for details).  83 year old female presenting with generalized abdominal discomfort and vomiting concern for possible bowel  obstruction as such CT scan of the abdomen and pelvis was performed. CT scan consistent with possible ileus versus early small bowel obstruction as per radiologist. Burtis Junes possible small bowel obstruction and a such patient was discussed with Dr. Rochel Brome general surgeon. Dr. Tamala Julian requested that the patient be admitted to the hospitalists and that Dr. Bynum Bellows evaluated the patient this morning.  Of note patient's pain and nausea completely resolved status post IV morphine and Zofran      ____________________________________________  FINAL CLINICAL IMPRESSION(S) / ED DIAGNOSES  Final diagnoses:  Small bowel obstruction     MEDICATIONS GIVEN DURING THIS VISIT:  Medications  iopamidol (ISOVUE-300) 61 % injection 15 mL (15 mLs Oral Contrast Given 04/13/16 0029)  morphine 2 MG/ML injection 2 mg (2 mg Intravenous Given 04/12/16 2357)  ondansetron (ZOFRAN) injection 4 mg (4 mg Intravenous Given 04/12/16 2357)  iopamidol (ISOVUE-300) 61 % injection 75 mL (75 mLs Intravenous Contrast Given 04/13/16 0128)     NEW OUTPATIENT MEDICATIONS STARTED DURING THIS VISIT:  New Prescriptions   No medications on file    Modified Medications   No medications on file    Discontinued Medications   No  medications on file     Note:  This document was prepared using Dragon voice recognition software and may include unintentional dictation errors.    Gregor Hams, MD 04/13/16 [phone]

## 2016-04-13 ENCOUNTER — Encounter: Payer: Self-pay | Admitting: Radiology

## 2016-04-13 ENCOUNTER — Emergency Department: Payer: Medicare HMO

## 2016-04-13 DIAGNOSIS — Z9221 Personal history of antineoplastic chemotherapy: Secondary | ICD-10-CM | POA: Diagnosis not present

## 2016-04-13 DIAGNOSIS — K56609 Unspecified intestinal obstruction, unspecified as to partial versus complete obstruction: Secondary | ICD-10-CM | POA: Diagnosis present

## 2016-04-13 DIAGNOSIS — E1165 Type 2 diabetes mellitus with hyperglycemia: Secondary | ICD-10-CM | POA: Diagnosis present

## 2016-04-13 DIAGNOSIS — G8929 Other chronic pain: Secondary | ICD-10-CM | POA: Diagnosis present

## 2016-04-13 DIAGNOSIS — N179 Acute kidney failure, unspecified: Secondary | ICD-10-CM | POA: Diagnosis present

## 2016-04-13 DIAGNOSIS — E785 Hyperlipidemia, unspecified: Secondary | ICD-10-CM | POA: Diagnosis present

## 2016-04-13 DIAGNOSIS — Z8349 Family history of other endocrine, nutritional and metabolic diseases: Secondary | ICD-10-CM | POA: Diagnosis not present

## 2016-04-13 DIAGNOSIS — Z9049 Acquired absence of other specified parts of digestive tract: Secondary | ICD-10-CM | POA: Diagnosis not present

## 2016-04-13 DIAGNOSIS — Z803 Family history of malignant neoplasm of breast: Secondary | ICD-10-CM | POA: Diagnosis not present

## 2016-04-13 DIAGNOSIS — Z8041 Family history of malignant neoplasm of ovary: Secondary | ICD-10-CM | POA: Diagnosis not present

## 2016-04-13 DIAGNOSIS — Z833 Family history of diabetes mellitus: Secondary | ICD-10-CM | POA: Diagnosis not present

## 2016-04-13 DIAGNOSIS — Z90710 Acquired absence of both cervix and uterus: Secondary | ICD-10-CM | POA: Diagnosis not present

## 2016-04-13 DIAGNOSIS — I10 Essential (primary) hypertension: Secondary | ICD-10-CM | POA: Diagnosis present

## 2016-04-13 DIAGNOSIS — Z8249 Family history of ischemic heart disease and other diseases of the circulatory system: Secondary | ICD-10-CM | POA: Diagnosis not present

## 2016-04-13 DIAGNOSIS — K449 Diaphragmatic hernia without obstruction or gangrene: Secondary | ICD-10-CM | POA: Diagnosis present

## 2016-04-13 DIAGNOSIS — Z823 Family history of stroke: Secondary | ICD-10-CM | POA: Diagnosis not present

## 2016-04-13 DIAGNOSIS — E7800 Pure hypercholesterolemia, unspecified: Secondary | ICD-10-CM | POA: Diagnosis present

## 2016-04-13 DIAGNOSIS — Z85038 Personal history of other malignant neoplasm of large intestine: Secondary | ICD-10-CM | POA: Diagnosis not present

## 2016-04-13 DIAGNOSIS — M549 Dorsalgia, unspecified: Secondary | ICD-10-CM | POA: Diagnosis present

## 2016-04-13 DIAGNOSIS — K56600 Partial intestinal obstruction, unspecified as to cause: Secondary | ICD-10-CM | POA: Diagnosis present

## 2016-04-13 DIAGNOSIS — Z825 Family history of asthma and other chronic lower respiratory diseases: Secondary | ICD-10-CM | POA: Diagnosis not present

## 2016-04-13 DIAGNOSIS — Z791 Long term (current) use of non-steroidal anti-inflammatories (NSAID): Secondary | ICD-10-CM | POA: Diagnosis not present

## 2016-04-13 DIAGNOSIS — G629 Polyneuropathy, unspecified: Secondary | ICD-10-CM | POA: Diagnosis present

## 2016-04-13 LAB — COMPREHENSIVE METABOLIC PANEL
ALBUMIN: 4.7 g/dL (ref 3.5–5.0)
ALK PHOS: 55 U/L (ref 38–126)
ALT: 24 U/L (ref 14–54)
ANION GAP: 11 (ref 5–15)
AST: 32 U/L (ref 15–41)
BILIRUBIN TOTAL: 1.3 mg/dL — AB (ref 0.3–1.2)
BUN: 34 mg/dL — AB (ref 6–20)
CALCIUM: 10.4 mg/dL — AB (ref 8.9–10.3)
CO2: 26 mmol/L (ref 22–32)
CREATININE: 1.2 mg/dL — AB (ref 0.44–1.00)
Chloride: 104 mmol/L (ref 101–111)
GFR calc Af Amer: 46 mL/min — ABNORMAL LOW (ref >60)
GFR calc non Af Amer: 40 mL/min — ABNORMAL LOW (ref >60)
GLUCOSE: 128 mg/dL — AB (ref 65–99)
Potassium: 3.8 mmol/L (ref 3.5–5.1)
SODIUM: 141 mmol/L (ref 135–145)
Total Protein: 7.8 g/dL (ref 6.5–8.1)

## 2016-04-13 LAB — GLUCOSE, CAPILLARY
Glucose-Capillary: 104 mg/dL — ABNORMAL HIGH (ref 65–99)
Glucose-Capillary: 116 mg/dL — ABNORMAL HIGH (ref 65–99)
Glucose-Capillary: 98 mg/dL (ref 65–99)
Glucose-Capillary: 114 mg/dL — ABNORMAL HIGH (ref 65–99)

## 2016-04-13 LAB — TSH: TSH: 5.445 u[IU]/mL — ABNORMAL HIGH (ref 0.350–4.500)

## 2016-04-13 LAB — LIPASE, BLOOD: Lipase: 29 U/L (ref 11–51)

## 2016-04-13 MED ORDER — INSULIN ASPART 100 UNIT/ML ~~LOC~~ SOLN
0.0000 [IU] | Freq: Three times a day (TID) | SUBCUTANEOUS | Status: DC
  Administered 2016-04-14: 1 [IU] via SUBCUTANEOUS
  Filled 2016-04-13: qty 1

## 2016-04-13 MED ORDER — IOPAMIDOL (ISOVUE-300) INJECTION 61%
75.0000 mL | Freq: Once | INTRAVENOUS | Status: AC | PRN
  Administered 2016-04-13: 75 mL via INTRAVENOUS

## 2016-04-13 MED ORDER — AMLODIPINE BESYLATE 10 MG PO TABS
10.0000 mg | ORAL_TABLET | Freq: Every day | ORAL | Status: DC
  Administered 2016-04-13 – 2016-04-14 (×2): 10 mg via ORAL
  Filled 2016-04-13 (×2): qty 1

## 2016-04-13 MED ORDER — OMEGA-3-ACID ETHYL ESTERS 1 G PO CAPS
2.0000 g | ORAL_CAPSULE | Freq: Every day | ORAL | Status: DC
  Administered 2016-04-13 – 2016-04-14 (×2): 2 g via ORAL
  Filled 2016-04-13 (×2): qty 2

## 2016-04-13 MED ORDER — ACETAMINOPHEN 650 MG RE SUPP
650.0000 mg | Freq: Four times a day (QID) | RECTAL | Status: DC | PRN
Start: 2016-04-13 — End: 2016-04-14

## 2016-04-13 MED ORDER — ONDANSETRON HCL 4 MG PO TABS: 4.0000 mg | ORAL_TABLET | Freq: Four times a day (QID) | ORAL | Status: DC | PRN

## 2016-04-13 MED ORDER — HEPARIN SODIUM (PORCINE) 5000 UNIT/ML IJ SOLN
5000.0000 [IU] | Freq: Three times a day (TID) | INTRAMUSCULAR | Status: DC
  Administered 2016-04-13 – 2016-04-14 (×5): 5000 [IU] via SUBCUTANEOUS
  Filled 2016-04-13 (×5): qty 1

## 2016-04-13 MED ORDER — DOCUSATE SODIUM 100 MG PO CAPS
100.0000 mg | ORAL_CAPSULE | Freq: Two times a day (BID) | ORAL | Status: DC
  Administered 2016-04-13 – 2016-04-14 (×3): 100 mg via ORAL
  Filled 2016-04-13 (×3): qty 1

## 2016-04-13 MED ORDER — TRAMADOL-ACETAMINOPHEN 37.5-325 MG PO TABS: 1.0000 | ORAL_TABLET | Freq: Two times a day (BID) | ORAL | Status: DC | PRN

## 2016-04-13 MED ORDER — ONDANSETRON HCL 4 MG/2ML IJ SOLN: 4.0000 mg | Freq: Four times a day (QID) | INTRAMUSCULAR | Status: DC | PRN

## 2016-04-13 MED ORDER — IOPAMIDOL (ISOVUE-300) INJECTION 61%
15.0000 mL | INTRAVENOUS | Status: DC
  Administered 2016-04-13: 15 mL via ORAL

## 2016-04-13 MED ORDER — PANTOPRAZOLE SODIUM 40 MG PO TBEC
40.0000 mg | DELAYED_RELEASE_TABLET | Freq: Two times a day (BID) | ORAL | Status: DC
  Administered 2016-04-13 – 2016-04-14 (×3): 40 mg via ORAL
  Filled 2016-04-13 (×3): qty 1

## 2016-04-13 MED ORDER — DEXTROSE-NACL 5-0.45 % IV SOLN
INTRAVENOUS | Status: DC
  Administered 2016-04-13 – 2016-04-14 (×3): via INTRAVENOUS

## 2016-04-13 MED ORDER — LOSARTAN POTASSIUM 50 MG PO TABS
100.0000 mg | ORAL_TABLET | Freq: Every day | ORAL | Status: DC
  Administered 2016-04-13 – 2016-04-14 (×2): 100 mg via ORAL
  Filled 2016-04-13 (×2): qty 2

## 2016-04-13 MED ORDER — HYDRALAZINE HCL 20 MG/ML IJ SOLN: 10.0000 mg | Freq: Four times a day (QID) | INTRAMUSCULAR | Status: DC | PRN

## 2016-04-13 MED ORDER — OXYCODONE HCL 5 MG PO TABS: 5.0000 mg | ORAL_TABLET | Freq: Three times a day (TID) | ORAL | Status: DC | PRN

## 2016-04-13 MED ORDER — LORATADINE 10 MG PO TABS
10.0000 mg | ORAL_TABLET | Freq: Every day | ORAL | Status: DC
  Administered 2016-04-13 – 2016-04-14 (×2): 10 mg via ORAL
  Filled 2016-04-13 (×2): qty 1

## 2016-04-13 MED ORDER — SIMVASTATIN 20 MG PO TABS
10.0000 mg | ORAL_TABLET | Freq: Every day | ORAL | Status: DC
  Administered 2016-04-13: 10 mg via ORAL
  Filled 2016-04-13: qty 1

## 2016-04-13 MED ORDER — ACETAMINOPHEN 325 MG PO TABS: 650.0000 mg | ORAL_TABLET | Freq: Four times a day (QID) | ORAL | Status: DC | PRN

## 2016-04-13 MED ORDER — INSULIN ASPART 100 UNIT/ML ~~LOC~~ SOLN: 0.0000 [IU] | Freq: Every day | SUBCUTANEOUS | Status: DC

## 2016-04-13 MED ORDER — MECLIZINE HCL 25 MG PO TABS: 25.0000 mg | ORAL_TABLET | Freq: Three times a day (TID) | ORAL | Status: DC | PRN

## 2016-04-13 MED ORDER — METOPROLOL SUCCINATE ER 25 MG PO TB24
25.0000 mg | ORAL_TABLET | Freq: Every day | ORAL | Status: DC
  Administered 2016-04-13 – 2016-04-14 (×2): 25 mg via ORAL
  Filled 2016-04-13 (×2): qty 1

## 2016-04-13 NOTE — H&P (Signed)
[name] is an 83 y.o. female.   Chief Complaint: Nausea and vomiting HPI: The patient with past medical history significant for colon cancer status post resection, Barrett's esophagus and hypertension presents to the emergency department complaining of nausea and vomiting. Symptoms began approximately 12 hours prior to admission. Her emesis was nonbloody nonbilious. She has had this happen multiple times in the past most recently 5 months ago. CT scan of the abdomen did not demonstrate a discrete transition point that was present on previous admission. Due to recurrent nausea vomiting and abdominal pain the emergency department staff all the hospitalist service for admission.  Past Medical History:  Diagnosis Date  . Barrett's esophagus   . Bowel obstruction    s/p adhesion resection  . Chronic back pain   . Colon cancer ([place]) 1988 and 1989   adenocarcinoma, s/p resection x  2 and chemo  . Diverticulitis   . H/O open leg wound   . Hiatal hernia   . Hypercholesteremia   . Hypertension   . Lumbar scoliosis   . Peripheral neuropathy ([place])   . Recurrent sinus infections   . Renal cyst   . S/P chemotherapy, time since greater than 12 weeks    colon cancer  . Vertigo     Past Surgical History:  Procedure Laterality Date  . ABDOMINAL HYSTERECTOMY  1982  . adhesions resected     bowel obstruction  . APPENDECTOMY    . BREAST BIOPSY Left    negative 06/14/1985  . CHOLECYSTECTOMY    . COLON RESECTION     x2.  s/p colon cancer  . EXCISIONAL HEMORRHOIDECTOMY     with tubal ligation    Family History  Problem Relation Age of Onset  . Breast cancer Mother   . Asthma Mother   . Stroke Father   . Hypertension Father   . Diabetes Father   . Ovarian cancer Sister     x2  . Prostate cancer Brother   . Hypertension Brother     x3  . Hypercholesterolemia Brother     x3  . Diabetes Brother    Social History:  reports that she has never smoked. She has never used smokeless  tobacco. She reports that she does not drink alcohol or use drugs.  Allergies: No Known Allergies  Medications Prior to Admission  Medication Sig Dispense Refill  . amLODipine (NORVASC) 10 MG tablet Take 1 tablet (10 mg total) by mouth daily. 30 tablet 3  . Calcium Carbonate-Vitamin D (CALCIUM 600 + D PO) Take 600 Units by mouth 2 (two) times daily with a meal.     . fexofenadine (ALLEGRA) 60 MG tablet TAKE 1 TABLET EVERY DAY 90 tablet 3  . fish oil-omega-3 fatty acids 1000 MG capsule Take 2 g by mouth daily.    Marland Kitchen losartan (COZAAR) 100 MG tablet TAKE 1 TABLET EVERY DAY 90 tablet 1  . meclizine (ANTIVERT) 25 MG tablet Take 25 mg by mouth 3 (three) times daily as needed.    . meloxicam (MOBIC) 7.5 MG tablet Take 7.5 mg by mouth daily.    . metoprolol succinate (TOPROL-XL) 25 MG 24 hr tablet TAKE 1 TABLET EVERY DAY 90 tablet 3  . oxyCODONE (OXY IR/ROXICODONE) 5 MG immediate release tablet Take 5 mg by mouth 3 (three) times daily.     . pantoprazole (PROTONIX) 40 MG tablet Take 40 mg by mouth 2 (two) times daily.    . simvastatin (ZOCOR) 10 MG tablet TAKE  1 TABLET AT BEDTIME 90 tablet 3  . traMADol-acetaminophen (ULTRACET) 37.5-325 MG per tablet Take 1 tablet by mouth 2 (two) times daily as needed.     . ondansetron (ZOFRAN) 4 MG tablet Take 1 tablet (4 mg total) by mouth 2 (two) times daily as needed for nausea or vomiting. 30 tablet 0    Results for orders placed or performed during the hospital encounter of 04/12/16 (from the past 48 hour(s))  CBC with Differential     Status: Abnormal   Collection Time: 04/12/16 11:51 PM  Result Value Ref Range   WBC 16.5 (H) 3.6 - 11.0 K/uL   RBC 4.49 3.80 - 5.20 MIL/uL   Hemoglobin 14.4 12.0 - 16.0 g/dL   HCT 41.4 35.0 - 47.0 %   MCV 92.3 80.0 - 100.0 fL   MCH 32.1 26.0 - 34.0 pg   MCHC 34.8 32.0 - 36.0 g/dL   RDW 13.6 11.5 - 14.5 %   Platelets 290 150 - 440 K/uL   Neutrophils Relative % 73 %   Neutro Abs 12.0 (H) 1.4 - 6.5 K/uL   Lymphocytes  Relative 20 %   Lymphs Abs 3.3 1.0 - 3.6 K/uL   Monocytes Relative 5 %   Monocytes Absolute 0.8 0.2 - 0.9 K/uL   Eosinophils Relative 1 %   Eosinophils Absolute 0.2 0 - 0.7 K/uL   Basophils Relative 1 %   Basophils Absolute 0.2 (H) 0 - 0.1 K/uL  Comprehensive metabolic panel     Status: Abnormal   Collection Time: 04/12/16 11:51 PM  Result Value Ref Range   Sodium 141 135 - 145 mmol/L   Potassium 3.8 3.5 - 5.1 mmol/L   Chloride 104 101 - 111 mmol/L   CO2 26 22 - 32 mmol/L   Glucose, Bld 128 (H) 65 - 99 mg/dL   BUN 34 (H) 6 - 20 mg/dL   Creatinine, Ser 1.20 (H) 0.44 - 1.00 mg/dL   Calcium 10.4 (H) 8.9 - 10.3 mg/dL   Total Protein 7.8 6.5 - 8.1 g/dL   Albumin 4.7 3.5 - 5.0 g/dL   AST 32 15 - 41 U/L   ALT 24 14 - 54 U/L   Alkaline Phosphatase 55 38 - 126 U/L   Total Bilirubin 1.3 (H) 0.3 - 1.2 mg/dL   GFR calc non Af Amer 40 (L) >60 mL/min   GFR calc Af Amer 46 (L) >60 mL/min    Comment: (NOTE) The eGFR has been calculated using the CKD EPI equation. This calculation has not been validated in all clinical situations. eGFR's persistently <60 mL/min signify possible Chronic Kidney Disease.    Anion gap 11 5 - 15  Lipase, blood     Status: None   Collection Time: 04/12/16 11:51 PM  Result Value Ref Range   Lipase 29 11 - 51 U/L  TSH     Status: Abnormal   Collection Time: 04/12/16 11:51 PM  Result Value Ref Range   TSH 5.445 (H) 0.350 - 4.500 uIU/mL    Comment: Performed by a 3rd Generation assay with a functional sensitivity of <=0.01 uIU/mL.  Glucose, capillary     Status: Abnormal   Collection Time: 04/13/16  7:35 AM  Result Value Ref Range   Glucose-Capillary 104 (H) 65 - 99 mg/dL   Ct Abdomen Pelvis W Contrast  Result Date: 04/13/2016 CLINICAL DATA:  Abdominal pain for 1 day.  Vomiting. EXAM: CT ABDOMEN AND PELVIS WITH CONTRAST TECHNIQUE: Multidetector CT imaging of the abdomen  and pelvis was performed using the standard protocol following bolus administration of  intravenous contrast. CONTRAST:  64m ISOVUE-300 IOPAMIDOL (ISOVUE-300) INJECTION 61% COMPARISON:  CT 11/24/2015 FINDINGS: Lower chest: No consolidation or pleural fluid. Moderate hiatal hernia. Hepatobiliary: Chronic biliary dilatation with common bile duct measuring 12 mm at the porta hepatis. This is unchanged from prior exam. Postcholecystectomy. No focal hepatic lesion. Pancreas: Chronic proximal pancreatic ductal dilatation measuring up to 7 mm. No peripancreatic inflammation. No evidence pancreatitis. Spleen: Splenic granuloma.  Normal in size. Adrenals/Urinary Tract: Normal adrenal glands. No hydronephrosis. Symmetric renal enhancement and excretion on delayed phase imaging. Unchanged cyst in the upper left kidney measuring 5.3 cm with peripheral calcification. Scattered cortical hypodensities throughout both kidneys. Urinary bladder is physiologically distended without wall thickening. Stomach/Bowel: Moderate hiatal hernia. Stomach physiologically distended with enteric contrast. Distal small bowel loops are fluid-filled and borderline dilated. Fluid within the transverse transverse colon, post right hemicolectomy. Moderate stool in the more distal colon. Minimal mesenteric edema of fluid-filled lower bowel loops, no free fluid. No pneumatosis. Vascular/Lymphatic: Aortic atherosclerosis and tortuosity. No aneurysm no adenopathy. Reproductive: Status post hysterectomy. No adnexal masses. Other: No free air.  No intra-abdominal abscess. Musculoskeletal: Scoliotic curvature of the spine with degenerative change. There are no acute or suspicious osseous abnormalities. IMPRESSION: 1. Fluid-filled distal small bowel which is borderline dilated. Additionally there is fluid in the proximal colon to the mid transverse, with moderate stool distally. Overall findings suggest generalized ileus and slow transit, less likely early small bowel obstruction. The discrete transition seen on prior exam is not present  currently. 2. Chronic pancreatic and biliary ductal dilatation, stable. 3. Moderate hiatal hernia. Electronically Signed   By: MJeb LeveringM.D.   On: 04/13/2016 01:54    Review of Systems  Constitutional: Negative for chills and fever.  HENT: Negative for sore throat and tinnitus.   Eyes: Negative for blurred vision and redness.  Respiratory: Negative for cough and shortness of breath.   Cardiovascular: Negative for chest pain, palpitations, orthopnea and PND.  Gastrointestinal: Positive for abdominal pain, nausea and vomiting. Negative for diarrhea.  Genitourinary: Negative for dysuria, frequency and urgency.  Musculoskeletal: Negative for joint pain and myalgias.  Skin: Negative for rash.       No lesions  Neurological: Negative for speech change, focal weakness and weakness.  Endo/Heme/Allergies: Does not bruise/bleed easily.       No temperature intolerance  Psychiatric/Behavioral: Negative for depression and suicidal ideas.    Blood pressure (!) 161/55, pulse 70, temperature 98.5 F (36.9 C), temperature source Oral, resp. rate 14, height 5' 1"  (1.549 m), weight 61.7 kg (136 lb 0.4 oz), SpO2 97 %. Physical Exam  Vitals reviewed. Constitutional: She is oriented to person, place, and time. She appears well-developed and well-nourished. No distress.  HENT:  Head: Normocephalic and atraumatic.  Mouth/Throat: Oropharynx is clear and moist.  Eyes: Conjunctivae and EOM are normal. Pupils are equal, round, and reactive to light. No scleral icterus.  Neck: Normal range of motion. Neck supple. No JVD present. No tracheal deviation present. No thyromegaly present.  Cardiovascular: Normal rate, regular rhythm and normal heart sounds.  Exam reveals no gallop and no friction rub.   No murmur heard. Respiratory: Effort normal and breath sounds normal.  GI: Soft. Bowel sounds are normal. She exhibits no distension. There is no tenderness.  Genitourinary:  Genitourinary Comments: Deferred   Musculoskeletal: Normal range of motion. She exhibits no edema.  Lymphadenopathy:    She has no cervical  adenopathy.  Neurological: She is alert and oriented to person, place, and time. No cranial nerve deficit. She exhibits normal muscle tone.  Skin: Skin is warm and dry. No rash noted. No erythema.  Psychiatric: She has a normal mood and affect. Her behavior is normal. Judgment and thought content normal.     Assessment/Plan This is an 83 year old female admitted for small bowel obstruction. 1. Small bowel traction: Differential diagnosis includes ileus. The patient has had significant adhesions in the past. The surgical service has been consulted and will follow the patient. She is much more comfortable at this time. No nasogastric tube decompression at this time. 2. Acute kidney injury: Secondary to dehydration due to persistent nausea and vomiting. Hydrate with intravenous saline. Avoid nephrotoxic agents. 3. Leukocytosis: Essential etiology is stress due to persistent nausea and vomiting. Possible concern for mesenteric ischemia. No clear infectious source at this time, thus defer antibiotics. Continue to follow 4. Essential hypertension: Uncontrolled (likely exacerbated due to persistent vomiting). Continue amlodipine, hydralazine, losartan and metoprolol. 5. Hyperlipidemia: Continue statin therapy 6. Diabetes mellitus type 2: Findings vital insulin while hospitalized 7. DVT prophylaxis: Heparin 8. GI prophylaxis: Pantoprazole The patient is a full code. Time spent on admission orders and patient care approximately 45 minutes  Harrie Foreman, MD 04/13/2016, 8:33 AM

## 2016-04-13 NOTE — ED Notes (Signed)
Pt is finished with contrast. CT notified.

## 2016-04-13 NOTE — ED Notes (Signed)
Lechelle Wrigley RN helped the Pt to the bedside commode. Pt voided and returned to her bed.

## 2016-04-13 NOTE — Consult Note (Signed)
Reason for Consult: Abdominal pain and suspected partial small bowel obstruction  Referring Physician: Dr. Mila Palmer is an 83 y.o. female.  HPI: This is an 83 year old female who I have known from the past admitted with the suspicion of partial small bowel obstruction. Patient has had multiple episodes in the past of similar situations which have resolved with conservative management. She has a history of having had a right colon and subsequent colon resection the details of which are not available of these were done many years ago. She started off with the significant lower abdominal crampy pains yesterday afternoon which persisted and therefore she presented to the emergency room. She had associated nausea and vomiting. Her last bowel movement was 2 days ago. Since admission to the hospital this morning this patient has had no further pain no nausea or vomiting  Past Medical History:  Diagnosis Date  . Barrett's esophagus   . Bowel obstruction    s/p adhesion resection  . Chronic back pain   . Colon cancer ([place]) 1988 and 1989   adenocarcinoma, s/p resection x  2 and chemo  . Diverticulitis   . H/O open leg wound   . Hiatal hernia   . Hypercholesteremia   . Hypertension   . Lumbar scoliosis   . Peripheral neuropathy ([place])   . Recurrent sinus infections   . Renal cyst   . S/P chemotherapy, time since greater than 12 weeks    colon cancer  . Vertigo     Past Surgical History:  Procedure Laterality Date  . ABDOMINAL HYSTERECTOMY  1982  . adhesions resected     bowel obstruction  . APPENDECTOMY    . BREAST BIOPSY Left    negative 06/14/1985  . CHOLECYSTECTOMY    . COLON RESECTION     x2.  s/p colon cancer  . EXCISIONAL HEMORRHOIDECTOMY     with tubal ligation    Family History  Problem Relation Age of Onset  . Breast cancer Mother   . Asthma Mother   . Stroke Father   . Hypertension Father   . Diabetes Father   . Ovarian cancer Sister     x2  . Prostate cancer  Brother   . Hypertension Brother     x3  . Hypercholesterolemia Brother     x3  . Diabetes Brother     Social History:  reports that she has never smoked. She has never used smokeless tobacco. She reports that she does not drink alcohol or use drugs.  Allergies: No Known Allergies  Medications: I have reviewed the patient's current medications.  Results for orders placed or performed during the hospital encounter of 04/12/16 (from the past 48 hour(s))  CBC with Differential     Status: Abnormal   Collection Time: 04/12/16 11:51 PM  Result Value Ref Range   WBC 16.5 (H) 3.6 - 11.0 K/uL   RBC 4.49 3.80 - 5.20 MIL/uL   Hemoglobin 14.4 12.0 - 16.0 g/dL   HCT 41.4 35.0 - 47.0 %   MCV 92.3 80.0 - 100.0 fL   MCH 32.1 26.0 - 34.0 pg   MCHC 34.8 32.0 - 36.0 g/dL   RDW 13.6 11.5 - 14.5 %   Platelets 290 150 - 440 K/uL   Neutrophils Relative % 73 %   Neutro Abs 12.0 (H) 1.4 - 6.5 K/uL   Lymphocytes Relative 20 %   Lymphs Abs 3.3 1.0 - 3.6 K/uL   Monocytes Relative 5 %  Monocytes Absolute 0.8 0.2 - 0.9 K/uL   Eosinophils Relative 1 %   Eosinophils Absolute 0.2 0 - 0.7 K/uL   Basophils Relative 1 %   Basophils Absolute 0.2 (H) 0 - 0.1 K/uL  Comprehensive metabolic panel     Status: Abnormal   Collection Time: 04/12/16 11:51 PM  Result Value Ref Range   Sodium 141 135 - 145 mmol/L   Potassium 3.8 3.5 - 5.1 mmol/L   Chloride 104 101 - 111 mmol/L   CO2 26 22 - 32 mmol/L   Glucose, Bld 128 (H) 65 - 99 mg/dL   BUN 34 (H) 6 - 20 mg/dL   Creatinine, Ser 1.20 (H) 0.44 - 1.00 mg/dL   Calcium 10.4 (H) 8.9 - 10.3 mg/dL   Total Protein 7.8 6.5 - 8.1 g/dL   Albumin 4.7 3.5 - 5.0 g/dL   AST 32 15 - 41 U/L   ALT 24 14 - 54 U/L   Alkaline Phosphatase 55 38 - 126 U/L   Total Bilirubin 1.3 (H) 0.3 - 1.2 mg/dL   GFR calc non Af Amer 40 (L) >60 mL/min   GFR calc Af Amer 46 (L) >60 mL/min    Comment: (NOTE) The eGFR has been calculated using the CKD EPI equation. This calculation has not  been validated in all clinical situations. eGFR's persistently <60 mL/min signify possible Chronic Kidney Disease.    Anion gap 11 5 - 15  Lipase, blood     Status: None   Collection Time: 04/12/16 11:51 PM  Result Value Ref Range   Lipase 29 11 - 51 U/L  TSH     Status: Abnormal   Collection Time: 04/12/16 11:51 PM  Result Value Ref Range   TSH 5.445 (H) 0.350 - 4.500 uIU/mL    Comment: Performed by a 3rd Generation assay with a functional sensitivity of <=0.01 uIU/mL.  Glucose, capillary     Status: Abnormal   Collection Time: 04/13/16  7:35 AM  Result Value Ref Range   Glucose-Capillary 104 (H) 65 - 99 mg/dL  Glucose, capillary     Status: Abnormal   Collection Time: 04/13/16 11:52 AM  Result Value Ref Range   Glucose-Capillary 116 (H) 65 - 99 mg/dL    Ct Abdomen Pelvis W Contrast  Result Date: 04/13/2016 CLINICAL DATA:  Abdominal pain for 1 day.  Vomiting. EXAM: CT ABDOMEN AND PELVIS WITH CONTRAST TECHNIQUE: Multidetector CT imaging of the abdomen and pelvis was performed using the standard protocol following bolus administration of intravenous contrast. CONTRAST:  11m ISOVUE-300 IOPAMIDOL (ISOVUE-300) INJECTION 61% COMPARISON:  CT 11/24/2015 FINDINGS: Lower chest: No consolidation or pleural fluid. Moderate hiatal hernia. Hepatobiliary: Chronic biliary dilatation with common bile duct measuring 12 mm at the porta hepatis. This is unchanged from prior exam. Postcholecystectomy. No focal hepatic lesion. Pancreas: Chronic proximal pancreatic ductal dilatation measuring up to 7 mm. No peripancreatic inflammation. No evidence pancreatitis. Spleen: Splenic granuloma.  Normal in size. Adrenals/Urinary Tract: Normal adrenal glands. No hydronephrosis. Symmetric renal enhancement and excretion on delayed phase imaging. Unchanged cyst in the upper left kidney measuring 5.3 cm with peripheral calcification. Scattered cortical hypodensities throughout both kidneys. Urinary bladder is  physiologically distended without wall thickening. Stomach/Bowel: Moderate hiatal hernia. Stomach physiologically distended with enteric contrast. Distal small bowel loops are fluid-filled and borderline dilated. Fluid within the transverse transverse colon, post right hemicolectomy. Moderate stool in the more distal colon. Minimal mesenteric edema of fluid-filled lower bowel loops, no free fluid. No pneumatosis. Vascular/Lymphatic: Aortic  atherosclerosis and tortuosity. No aneurysm no adenopathy. Reproductive: Status post hysterectomy. No adnexal masses. Other: No free air.  No intra-abdominal abscess. Musculoskeletal: Scoliotic curvature of the spine with degenerative change. There are no acute or suspicious osseous abnormalities. IMPRESSION: 1. Fluid-filled distal small bowel which is borderline dilated. Additionally there is fluid in the proximal colon to the mid transverse, with moderate stool distally. Overall findings suggest generalized ileus and slow transit, less likely early small bowel obstruction. The discrete transition seen on prior exam is not present currently. 2. Chronic pancreatic and biliary ductal dilatation, stable. 3. Moderate hiatal hernia. Electronically Signed   By: Jeb Levering M.D.   On: 04/13/2016 01:54    Review of Systems  Constitutional: Negative.   Respiratory: Negative.   Cardiovascular: Negative.   Gastrointestinal: Positive for abdominal pain, nausea and vomiting. Negative for constipation and diarrhea.  Genitourinary: Negative.    Blood pressure (!) 130/50, pulse (!) 58, temperature 98.5 F (36.9 C), temperature source Oral, resp. rate 16, height _0  (1.549 m), weight 136 lb 0.4 oz (61.7 kg), SpO2 96 %. Physical Exam  Constitutional: She is oriented to person, place, and time. She appears well-developed and well-nourished.  Eyes: Conjunctivae are normal.  Neck: Neck supple.  Cardiovascular: Normal rate, regular rhythm and normal heart sounds.   Respiratory:  Effort normal and breath sounds normal.  GI: Soft. Bowel sounds are normal. She exhibits no distension and no mass. There is tenderness.  Lymphadenopathy:    She has no cervical adenopathy.  Neurological: She is alert and oriented to person, place, and time.  Skin: Skin is warm and dry.    Assessment/Plan: CT scan was reviewed and the findings of what the inconclusive for small bowel obstruction. Is very mild if any dilatation of the small bowel with some fluid and some fluid-filled loops of colon also identified somewhat still. These findings suggest an ileus more than a mechanical obstruction. Recommend continue nothing by mouth and IV fluids we'll reevaluate tomorrow with   plain films of the abdomen. Thank you for allowing me to evaluate and help in the care of this patient.  SANKAR,SEEPLAPUTHUR G 04/13/2016, 1:42 PM

## 2016-04-14 ENCOUNTER — Inpatient Hospital Stay: Payer: Medicare HMO

## 2016-04-14 LAB — GLUCOSE, CAPILLARY
Glucose-Capillary: 103 mg/dL — ABNORMAL HIGH (ref 65–99)
Glucose-Capillary: 132 mg/dL — ABNORMAL HIGH (ref 65–99)

## 2016-04-14 LAB — HEMOGLOBIN A1C
Hgb A1c MFr Bld: 5.3 % (ref 4.8–5.6)
Mean Plasma Glucose: 105 mg/dL

## 2016-04-14 MED ORDER — POLYETHYLENE GLYCOL 3350 17 G PO PACK
17.0000 g | PACK | Freq: Every day | ORAL | Status: DC
  Administered 2016-04-14: 17 g via ORAL
  Filled 2016-04-14: qty 1

## 2016-04-14 NOTE — Care Management Important Message (Signed)
Important Message  Patient Details  Name: [name] MRN: [id number] Date of Birth: 1928-09-22   Medicare Important Message Given:  N/A - LOS <3 / Initial given by admissions    Beverly Sessions, RN 04/14/2016, 10:46 AM

## 2016-04-14 NOTE — Discharge Instructions (Signed)
Resume diet and activity as before ° ° °

## 2016-04-14 NOTE — Progress Notes (Signed)
Discharge   Pt was able to participate with discharge teaching with family at the bedside. PIV was removed by nursing student and family helped pt dress. No questions were asked and wheelchair was ordered for pt. Pt instructed about followup appts and lab work.

## 2016-04-14 NOTE — Progress Notes (Signed)
Patient ID: [name], female   DOB: 02-14-1929, 83 y.o.   MRN: [id number] Patient is well appearing this morning. She is not complaining of any abdominal pain. She has had no nausea or vomiting. She reports having a BM yesterday afternoon.  AVSS Abdomen- no tenderness or distention noted. Normoactive bowel sounds.  Xray of abdomen shows normal bowel gas pattern.  Impression- ileus v. SBO appears resolved Recommendation- start clear liquid diet and advanced as tolerated Will follow as needed.

## 2016-04-15 ENCOUNTER — Telehealth: Payer: Self-pay | Admitting: Internal Medicine

## 2016-04-15 MED ORDER — AMLODIPINE BESYLATE 10 MG PO TABS: 10.0000 mg | ORAL_TABLET | Freq: Every day | ORAL | 0 refills | Status: DC

## 2016-04-15 NOTE — Telephone Encounter (Signed)
Transition Care Management Follow-up Telephone Call  How have you been since you were released from the hospital? Patient stated she is real weak, but has no pain at this time.    Do you understand why you were in the hospital? Yes had to have help with the pain and the bowel blockage.   Do you understand the discharge instrcutions? Yes,  Items Reviewed:  Medications reviewed: YES  Allergies reviewed: YES  Dietary changes reviewed: Yes soft foods and to chew food well.  Referrals reviewed: YES   Functional Questionnaire:   Activities of Daily Living (ADLs):   She states they are independent in the following: Independent in ADLs States they require assistance with the following: Does not require assist.   Any transportation issues/concerns?: No   Any patient concerns? No, hospital ist explained why the blockage happened.   Confirmed importance and date/time of follow-up visits scheduled:Yes.   Confirmed with patient if condition begins to worsen call PCP or go to the ER.  Patient was given the Call-a-Nurse line [phone]: Yes,

## 2016-04-15 NOTE — Telephone Encounter (Signed)
Patient notified and voiced understanding.

## 2016-04-15 NOTE — Telephone Encounter (Signed)
Ok to remain off losartan and we will f/u with her pressures.

## 2016-04-15 NOTE — Telephone Encounter (Signed)
Patient stated she has not taken losartan since adding amlodipine 10 mg BP today 136/80 pulse 65 on wrist cuff, left arm . Losartan according synopsis not given in hospital and BP was 141/53 pulse 66 and last OV 128/72 pulse 66 03/30/16. According to patient she has not taken losartan 100 mg since 03/01/16. Patient feels really concerned about start in losartan back and she feels so weak, patient stated she would if PCP thought she should.

## 2016-04-15 NOTE — Telephone Encounter (Signed)
Patient did have question as to why she received insulin injection in the hospital  Because she is not diabetic. CBG checked 132 on 04/14/16 but A1c done on 04/12/16 was 5.3.patient had a 7 week followup scheduled for 04/21/16 ok to add HFU to that appointment.? Patient stated also that PCP had D/C losartan and increased Amlodipine to 10 mg daily patient stated she needs 10 mg amlodipine sent to [facility] I have pended a 90 day supply if this is correct.

## 2016-04-15 NOTE — Telephone Encounter (Signed)
I am not aware of her losartan being stopped.  (I am not sure if they did in the hospital).  I am ok to send in refill for her amlodipine as requested.  Ok to send in.  We will follow up on the blood pressure here.  Her sugar may have been elevated with the acute stress of being admitted.  We will follow her sugars.  Ok to add hospital f/u

## 2016-04-18 ENCOUNTER — Other Ambulatory Visit: Payer: Self-pay

## 2016-04-18 NOTE — Patient Outreach (Signed)
[facility]) Care Management  04/18/2016  [name] 04-26-1928 [id number]    Transition of Care Referral  Referral Date: 04/18/16 Referral Source: [facility] Discharge Report Date of Discharge: 04/14/16 Facility: [facility] Diagnosis: unspecified intestinal obstruction Insurance: [employer]    Referral received. Patient received Transition of Call by PCP and therefore not eligible for [facility] TOC.      Plan: RN CM will notify [facility] administrative assistant of case status.   Enzo Montgomery, RN,BSN,CCM [place] Management Telephonic Care Management Coordinator Direct Phone: [phone] Toll Free: [phone] Fax: [phone]

## 2016-04-20 ENCOUNTER — Other Ambulatory Visit (INDEPENDENT_AMBULATORY_CARE_PROVIDER_SITE_OTHER): Payer: Medicare HMO

## 2016-04-20 DIAGNOSIS — E7800 Pure hypercholesterolemia, unspecified: Secondary | ICD-10-CM | POA: Diagnosis not present

## 2016-04-20 DIAGNOSIS — I10 Essential (primary) hypertension: Secondary | ICD-10-CM | POA: Diagnosis not present

## 2016-04-20 LAB — HEPATIC FUNCTION PANEL
ALK PHOS: 50 U/L (ref 39–117)
ALT: 22 U/L (ref 0–35)
AST: 26 U/L (ref 0–37)
Albumin: 4.4 g/dL (ref 3.5–5.2)
BILIRUBIN DIRECT: 0.2 mg/dL (ref 0.0–0.3)
TOTAL PROTEIN: 6.8 g/dL (ref 6.0–8.3)
Total Bilirubin: 1.1 mg/dL (ref 0.2–1.2)

## 2016-04-20 LAB — LIPID PANEL
CHOL/HDL RATIO: 2
CHOLESTEROL: 166 mg/dL (ref 0–200)
HDL: 78.1 mg/dL (ref >39.00)
LDL Cholesterol: 61 mg/dL (ref 0–99)
NonHDL: 88.1
Triglycerides: 136 mg/dL (ref 0.0–149.0)
VLDL: 27.2 mg/dL (ref 0.0–40.0)

## 2016-04-20 LAB — BASIC METABOLIC PANEL
BUN: 21 mg/dL (ref 6–23)
CHLORIDE: 101 meq/L (ref 96–112)
CO2: 29 mEq/L (ref 19–32)
CREATININE: 0.93 mg/dL (ref 0.40–1.20)
Calcium: 10.3 mg/dL (ref 8.4–10.5)
GFR: 60.87 mL/min (ref >60.00)
GLUCOSE: 94 mg/dL (ref 70–99)
Potassium: 4 mEq/L (ref 3.5–5.1)
Sodium: 140 mEq/L (ref 135–145)

## 2016-04-20 LAB — TSH: TSH: 2.07 u[IU]/mL (ref 0.35–4.50)

## 2016-04-20 NOTE — Discharge Summary (Signed)
[place] [facility] [place] NAME: [name]    MR#:  [id number]  DATE OF BIRTH:  10-23-1928  DATE OF ADMISSION:  04/12/2016 ADMITTING PHYSICIAN: Harrie Foreman, MD  DATE OF DISCHARGE: 04/14/2016  3:16 PM  PRIMARY CARE PHYSICIAN: [name], MD   ADMISSION DIAGNOSIS:  Small bowel obstruction [K56.609]  DISCHARGE DIAGNOSIS:  Active Problems:   AKI (acute kidney injury) ([place])   SECONDARY DIAGNOSIS:   Past Medical History:  Diagnosis Date  . Barrett's esophagus   . Bowel obstruction    s/p adhesion resection  . Chronic back pain   . Colon cancer ([place]) 1988 and 1989   adenocarcinoma, s/p resection x  2 and chemo  . Diverticulitis   . H/O open leg wound   . Hiatal hernia   . Hypercholesteremia   . Hypertension   . Lumbar scoliosis   . Peripheral neuropathy ([place])   . Recurrent sinus infections   . Renal cyst   . S/P chemotherapy, time since greater than 12 weeks    colon cancer  . Vertigo      ADMITTING HISTORY  Chief Complaint: Nausea and vomiting HPI: The patient with past medical history significant for colon cancer status post resection, Barrett's esophagus and hypertension presents to the emergency department complaining of nausea and vomiting. Symptoms began approximately 12 hours prior to admission. Her emesis was nonbloody nonbilious. She has had this happen multiple times in the past most recently 5 months ago. CT scan of the abdomen did not demonstrate a discrete transition point that was present on previous admission. Due to recurrent nausea vomiting and abdominal pain the emergency department staff all the hospitalist service for admission.  HOSPITAL COURSE:   * Partial small bowel obstruction Patient was admitted to medical floor. Initially was nothing by mouth. Did not have any vomiting and did not need an NG tube. By day 2 patient was passing gas and had stools. Seen by Dr. Jamal Collin of surgery. Patient had MiraLAX  with good bowel movements. No abdominal pain or vomiting on day of discharge. She tolerated regular diet. Advised to take laxatives at home to have one or 2 bowel movements at home. Her other comorbidities of hypertension remained stable.  Patient was placed on sliding scale insulin on admission due to mildly elevated blood sugars. HbA1c was 5.3. Will not need any diabetes medications at discharge.  Patient discharged home in stable condition to follow-up with her primary care physician in one to 2 weeks.  CONSULTS OBTAINED:  Treatment Team:  Christene Lye, MD  DRUG ALLERGIES:  No Known Allergies  DISCHARGE MEDICATIONS:   Discharge Medication List as of 04/14/2016  2:23 PM    CONTINUE these medications which have NOT CHANGED   Details  Calcium Carbonate-Vitamin D (CALCIUM 600 + D PO) Take 600 Units by mouth 2 (two) times daily with a meal. , Historical Med    fexofenadine (ALLEGRA) 60 MG tablet TAKE 1 TABLET EVERY DAY, Normal    fish oil-omega-3 fatty acids 1000 MG capsule Take 2 g by mouth daily., Historical Med    losartan (COZAAR) 100 MG tablet TAKE 1 TABLET EVERY DAY, Normal    meclizine (ANTIVERT) 25 MG tablet Take 25 mg by mouth 3 (three) times daily as needed., Historical Med    meloxicam (MOBIC) 7.5 MG tablet Take 7.5 mg by mouth daily., Historical Med    metoprolol succinate (TOPROL-XL) 25 MG 24 hr tablet TAKE 1 TABLET EVERY  DAY, Normal    oxyCODONE (OXY IR/ROXICODONE) 5 MG immediate release tablet Take 5 mg by mouth 3 (three) times daily. , Historical Med    pantoprazole (PROTONIX) 40 MG tablet Take 40 mg by mouth 2 (two) times daily., Historical Med    simvastatin (ZOCOR) 10 MG tablet TAKE 1 TABLET AT BEDTIME, Normal    traMADol-acetaminophen (ULTRACET) 37.5-325 MG per tablet Take 1 tablet by mouth 2 (two) times daily as needed. , Historical Med    amLODipine (NORVASC) 10 MG tablet Take 1 tablet (10 mg total) by mouth daily., Starting Tue 03/01/2016, Normal     ondansetron (ZOFRAN) 4 MG tablet Take 1 tablet (4 mg total) by mouth 2 (two) times daily as needed for nausea or vomiting., Starting Thu 06/25/2015, Normal        Today   VITAL SIGNS:  Blood pressure (!) 141/53, pulse 66, temperature 98.3 F (36.8 C), temperature source Oral, resp. rate 19, height 5\' 1"  (1.549 m), weight 61.7 kg (136 lb 0.4 oz), SpO2 98 %.  I/O:  No intake or output data in the 24 hours ending 04/20/16 1639  PHYSICAL EXAMINATION:  Physical Exam  GENERAL:  83 y.o.-year-old patient lying in the bed with no acute distress.  LUNGS: Normal breath sounds bilaterally, no wheezing, rales,rhonchi or crepitation. No use of accessory muscles of respiration.  CARDIOVASCULAR: S1, S2 normal. No murmurs, rubs, or gallops.  ABDOMEN: Soft, non-tender, non-distended. Bowel sounds present. No organomegaly or mass.  NEUROLOGIC: Moves all 4 extremities. PSYCHIATRIC: The patient is alert and oriented x 3.  SKIN: No obvious rash, lesion, or ulcer.   DATA REVIEW:   CBC No results for input(s): WBC, HGB, HCT, PLT in the last 168 hours.  Chemistries   Recent Labs Lab 04/20/16 0939  NA 140  K 4.0  CL 101  CO2 29  GLUCOSE 94  BUN 21  CREATININE 0.93  CALCIUM 10.3  AST 26  ALT 22  ALKPHOS 50  BILITOT 1.1    Cardiac Enzymes No results for input(s): TROPONINI in the last 168 hours.  Microbiology Results  No results found for this or any previous visit.  RADIOLOGY:  No results found.  Follow up with PCP in 1 week.  Management plans discussed with the patient, family and they are in agreement.  CODE STATUS:  Code Status History    Date Active Date Inactive Code Status Order ID Comments User Context   04/13/2016  4:29 AM 04/14/2016  6:16 PM Full Code [id number]  Harrie Foreman, MD Inpatient   11/24/2015 11:57 AM 11/28/2015  1:36 PM Full Code [id number]  Christene Lye, MD Inpatient   06/12/2015  2:05 AM 06/14/2015  2:16 PM DNR [id number]  Sylvan Cheese, MD  Inpatient    Advance Directive Documentation   Flowsheet Row Most Recent Value  Type of Advance Directive  Healthcare Power of Attorney, Living will  Pre-existing out of facility DNR order (yellow form or pink MOST form)  No data  "MOST" Form in Place?  No data      TOTAL TIME TAKING CARE OF THIS PATIENT ON DAY OF DISCHARGE: more than 30 minutes.   Hillary Bow R M.D on 04/20/2016 at 4:39 PM  Between 7am to 6pm - Pager - [phone]  After 6pm go to [email or web address] - password [email or web address] [facility]  Office  [phone]  CC: Primary care physician; [name], MD  Note: This dictation was prepared with Dragon dictation along with smaller phrase  technology. Any transcriptional errors that result from this process are unintentional.

## 2016-04-21 ENCOUNTER — Ambulatory Visit (INDEPENDENT_AMBULATORY_CARE_PROVIDER_SITE_OTHER): Payer: Medicare HMO | Admitting: Internal Medicine

## 2016-04-21 ENCOUNTER — Encounter: Payer: Self-pay | Admitting: Internal Medicine

## 2016-04-21 DIAGNOSIS — N179 Acute kidney failure, unspecified: Secondary | ICD-10-CM

## 2016-04-21 DIAGNOSIS — I10 Essential (primary) hypertension: Secondary | ICD-10-CM

## 2016-04-21 DIAGNOSIS — E7800 Pure hypercholesterolemia, unspecified: Secondary | ICD-10-CM

## 2016-04-21 DIAGNOSIS — E876 Hypokalemia: Secondary | ICD-10-CM | POA: Diagnosis not present

## 2016-04-21 DIAGNOSIS — K56609 Unspecified intestinal obstruction, unspecified as to partial versus complete obstruction: Secondary | ICD-10-CM

## 2016-04-21 NOTE — Progress Notes (Signed)
Pre-visit discussion using our clinic review tool. No additional management support is needed unless otherwise documented below in the visit note.  

## 2016-04-21 NOTE — Progress Notes (Signed)
Patient ID: [name], female   DOB: 1928-10-02, 83 y.o.   MRN: [id number]   Subjective:    Patient ID: [name], female    DOB: 1928/05/21, 83 y.o.   MRN: [id number]  HPI  Patient here for hospital follow up.  Was admitted 04/12/16 and diagnosed with SBO.  Also found to have AKI.   Had no nausea or vomiting.  Followed by surgery.  Did not require NG tube.  Started having good bowel movements.  Discharged.  Since her discharge she has done relatively well.  She is eating.  Stomach feels better.  Still trying to get her energy back.  No chest pain.  Breathing stable.  Still with some loose stool.  Only one per day.  She is taking stool softener.  Discussed probiotics.  She also informed me she is only taking amlodipine.  She stopped losartan when she started amlodipine.  Blood pressures have been doing ok only on amlodipine.  Outside checks averaging 120-140/70-80s.     Past Medical History:  Diagnosis Date  . Barrett's esophagus   . Bowel obstruction    s/p adhesion resection  . Chronic back pain   . Colon cancer ([place]) 1988 and 1989   adenocarcinoma, s/p resection x  2 and chemo  . Diverticulitis   . H/O open leg wound   . Hiatal hernia   . Hypercholesteremia   . Hypertension   . Lumbar scoliosis   . Peripheral neuropathy ([place])   . Recurrent sinus infections   . Renal cyst   . S/P chemotherapy, time since greater than 12 weeks    colon cancer  . Vertigo    Past Surgical History:  Procedure Laterality Date  . ABDOMINAL HYSTERECTOMY  1982  . adhesions resected     bowel obstruction  . APPENDECTOMY    . BREAST BIOPSY Left    negative 06/14/1985  . CHOLECYSTECTOMY    . COLON RESECTION     x2.  s/p colon cancer  . EXCISIONAL HEMORRHOIDECTOMY     with tubal ligation   Family History  Problem Relation Age of Onset  . Breast cancer Mother   . Asthma Mother   . Stroke Father   . Hypertension Father   . Diabetes Father   . Ovarian cancer Sister     x2  . Prostate  cancer Brother   . Hypertension Brother     x3  . Hypercholesterolemia Brother     x3  . Diabetes Brother    Social History   Social History  . Marital status: Married    Spouse name: N/A  . Number of children: 4  . Years of education: 12th grade   Occupational History  . homemaker    Social History Main Topics  . Smoking status: Never Smoker  . Smokeless tobacco: Never Used  . Alcohol use No  . Drug use: No  . Sexual activity: No   Other Topics Concern  . None   Social History Narrative  . None    Outpatient Encounter Prescriptions as of 04/21/2016  Medication Sig  . amLODipine (NORVASC) 10 MG tablet Take 1 tablet (10 mg total) by mouth daily.  . Calcium Carbonate-Vitamin D (CALCIUM 600 + D PO) Take 600 Units by mouth 2 (two) times daily with a meal.   . fexofenadine (ALLEGRA) 60 MG tablet Take 1 tablet (60 mg total) by mouth daily.  . fish oil-omega-3 fatty acids 1000 MG capsule Take 2 g by  mouth daily.  . meclizine (ANTIVERT) 25 MG tablet Take 25 mg by mouth 3 (three) times daily as needed.  . meloxicam (MOBIC) 7.5 MG tablet Take 7.5 mg by mouth daily.  . metoprolol succinate (TOPROL-XL) 25 MG 24 hr tablet Take 1 tablet (25 mg total) by mouth daily.  . ondansetron (ZOFRAN) 4 MG tablet Take 1 tablet (4 mg total) by mouth 2 (two) times daily as needed for nausea or vomiting.  Marland Kitchen oxyCODONE (OXY IR/ROXICODONE) 5 MG immediate release tablet Take 5 mg by mouth 3 (three) times daily.   . pantoprazole (PROTONIX) 40 MG tablet Take 40 mg by mouth 2 (two) times daily.  . simvastatin (ZOCOR) 10 MG tablet Take 1 tablet (10 mg total) by mouth at bedtime.  . traMADol-acetaminophen (ULTRACET) 37.5-325 MG per tablet Take 1 tablet by mouth 2 (two) times daily as needed.   . [DISCONTINUED] losartan (COZAAR) 100 MG tablet TAKE 1 TABLET EVERY DAY  . [DISCONTINUED] amLODipine (NORVASC) 10 MG tablet Take 1 tablet (10 mg total) by mouth daily.  . [DISCONTINUED] fexofenadine (ALLEGRA) 60 MG  tablet TAKE 1 TABLET EVERY DAY  . [DISCONTINUED] metoprolol succinate (TOPROL-XL) 25 MG 24 hr tablet TAKE 1 TABLET EVERY DAY  . [DISCONTINUED] simvastatin (ZOCOR) 10 MG tablet TAKE 1 TABLET AT BEDTIME   No facility-administered encounter medications on file as of 04/21/2016.     Review of Systems  Constitutional: Negative for appetite change and unexpected weight change.  HENT: Negative for congestion and sinus pressure.   Respiratory: Negative for cough, chest tightness and shortness of breath.   Cardiovascular: Negative for chest pain, palpitations and leg swelling.  Gastrointestinal: Negative for nausea and vomiting.       Abdominal pain better.  Having one loose stool per day.    Genitourinary: Negative for difficulty urinating and dysuria.  Musculoskeletal: Positive for back pain. Negative for joint swelling.  Skin: Negative for color change and rash.  Neurological: Negative for dizziness, light-headedness and headaches.  Psychiatric/Behavioral: Negative for agitation and dysphoric mood.       Objective:    Physical Exam  Constitutional: She appears well-developed and well-nourished. No distress.  HENT:  Nose: Nose normal.  Mouth/Throat: Oropharynx is clear and moist.  Neck: Neck supple. No thyromegaly present.  Cardiovascular: Normal rate and regular rhythm.   Pulmonary/Chest: Breath sounds normal. No respiratory distress. She has no wheezes.  Abdominal: Soft. Bowel sounds are normal. There is no tenderness.  Musculoskeletal: She exhibits no edema or tenderness.  Lymphadenopathy:    She has no cervical adenopathy.  Skin: No rash noted. No erythema.  Psychiatric: She has a normal mood and affect. Her behavior is normal.    BP (!) 148/78 (BP Location: Left Arm, Patient Position: Sitting, Cuff Size: Normal)   Pulse 69   Temp 98.8 F (37.1 C) (Oral)   Resp 18   Ht 5\' 1"  (1.549 m)   Wt 134 lb 3.2 oz (60.9 kg)   SpO2 97%   BMI 25.36 kg/m  Wt Readings from Last 3  Encounters:  04/21/16 134 lb 3.2 oz (60.9 kg)  04/13/16 136 lb 0.4 oz (61.7 kg)  03/30/16 136 lb 6.4 oz (61.9 kg)     Lab Results  Component Value Date   WBC 16.5 (H) 04/12/2016   HGB 14.4 04/12/2016   HCT 41.4 04/12/2016   PLT 290 04/12/2016   GLUCOSE 94 04/20/2016   CHOL 166 04/20/2016   TRIG 136.0 04/20/2016   HDL 78.10 04/20/2016  LDLCALC 61 04/20/2016   ALT 22 04/20/2016   AST 26 04/20/2016   NA 140 04/20/2016   K 4.0 04/20/2016   CL 101 04/20/2016   CREATININE 0.93 04/20/2016   BUN 21 04/20/2016   CO2 29 04/20/2016   TSH 2.07 04/20/2016   INR 0.91 06/11/2015   HGBA1C 5.3 04/12/2016    Ct Abdomen Pelvis W Contrast  Result Date: 04/13/2016 CLINICAL DATA:  Abdominal pain for 1 day.  Vomiting. EXAM: CT ABDOMEN AND PELVIS WITH CONTRAST TECHNIQUE: Multidetector CT imaging of the abdomen and pelvis was performed using the standard protocol following bolus administration of intravenous contrast. CONTRAST:  20mL ISOVUE-300 IOPAMIDOL (ISOVUE-300) INJECTION 61% COMPARISON:  CT 11/24/2015 FINDINGS: Lower chest: No consolidation or pleural fluid. Moderate hiatal hernia. Hepatobiliary: Chronic biliary dilatation with common bile duct measuring 12 mm at the porta hepatis. This is unchanged from prior exam. Postcholecystectomy. No focal hepatic lesion. Pancreas: Chronic proximal pancreatic ductal dilatation measuring up to 7 mm. No peripancreatic inflammation. No evidence pancreatitis. Spleen: Splenic granuloma.  Normal in size. Adrenals/Urinary Tract: Normal adrenal glands. No hydronephrosis. Symmetric renal enhancement and excretion on delayed phase imaging. Unchanged cyst in the upper left kidney measuring 5.3 cm with peripheral calcification. Scattered cortical hypodensities throughout both kidneys. Urinary bladder is physiologically distended without wall thickening. Stomach/Bowel: Moderate hiatal hernia. Stomach physiologically distended with enteric contrast. Distal small bowel loops  are fluid-filled and borderline dilated. Fluid within the transverse transverse colon, post right hemicolectomy. Moderate stool in the more distal colon. Minimal mesenteric edema of fluid-filled lower bowel loops, no free fluid. No pneumatosis. Vascular/Lymphatic: Aortic atherosclerosis and tortuosity. No aneurysm no adenopathy. Reproductive: Status post hysterectomy. No adnexal masses. Other: No free air.  No intra-abdominal abscess. Musculoskeletal: Scoliotic curvature of the spine with degenerative change. There are no acute or suspicious osseous abnormalities. IMPRESSION: 1. Fluid-filled distal small bowel which is borderline dilated. Additionally there is fluid in the proximal colon to the mid transverse, with moderate stool distally. Overall findings suggest generalized ileus and slow transit, less likely early small bowel obstruction. The discrete transition seen on prior exam is not present currently. 2. Chronic pancreatic and biliary ductal dilatation, stable. 3. Moderate hiatal hernia. Electronically Signed   By: Jeb Levering M.D.   On: 04/13/2016 01:54       Assessment & Plan:   Problem List Items Addressed This Visit    AKI (acute kidney injury) ([facility])    Kidney function on recheck improved/returned to normal - after discharge.        Hypercholesteremia    Follow lipid panel.        Relevant Medications   amLODipine (NORVASC) 10 MG tablet   metoprolol succinate (TOPROL-XL) 25 MG 24 hr tablet   simvastatin (ZOCOR) 10 MG tablet   Hypertension    Blood pressure doing well on amlodipine and metoprolol.  She is not taking losartan.  Will hold on restarting.  Follow pressures.        Relevant Medications   amLODipine (NORVASC) 10 MG tablet   metoprolol succinate (TOPROL-XL) 25 MG 24 hr tablet   simvastatin (ZOCOR) 10 MG tablet   Hypokalemia    Labs on recent recheck normalized.        Small bowel obstruction    Recently admitted.  Has done well since her discharge.  Eating.   No pain.  One loose stool per day.  Discussed probiotic.            [name], MD

## 2016-04-25 ENCOUNTER — Encounter: Payer: Self-pay | Admitting: Internal Medicine

## 2016-04-25 MED ORDER — AMLODIPINE BESYLATE 10 MG PO TABS: 10.0000 mg | ORAL_TABLET | Freq: Every day | ORAL | 1 refills | Status: DC

## 2016-04-25 MED ORDER — SIMVASTATIN 10 MG PO TABS: 10.0000 mg | ORAL_TABLET | Freq: Every day | ORAL | 3 refills | Status: DC

## 2016-04-25 MED ORDER — FEXOFENADINE HCL 60 MG PO TABS: 60.0000 mg | ORAL_TABLET | Freq: Every day | ORAL | 3 refills | Status: DC

## 2016-04-25 MED ORDER — METOPROLOL SUCCINATE ER 25 MG PO TB24: 25.0000 mg | ORAL_TABLET | Freq: Every day | ORAL | 3 refills | Status: DC

## 2016-04-25 NOTE — Assessment & Plan Note (Signed)
Kidney function on recheck improved/returned to normal - after discharge.

## 2016-04-25 NOTE — Assessment & Plan Note (Signed)
Blood pressure doing well on amlodipine and metoprolol.  She is not taking losartan.  Will hold on restarting.  Follow pressures.

## 2016-04-25 NOTE — Assessment & Plan Note (Signed)
Recently admitted.  Has done well since her discharge.  Eating.  No pain.  One loose stool per day.  Discussed probiotic.

## 2016-04-25 NOTE — Assessment & Plan Note (Signed)
Labs on recent recheck normalized.

## 2016-04-25 NOTE — Assessment & Plan Note (Signed)
Follow lipid panel.   

## 2016-05-24 DIAGNOSIS — M7062 Trochanteric bursitis, left hip: Secondary | ICD-10-CM | POA: Diagnosis not present

## 2016-05-24 DIAGNOSIS — M5136 Other intervertebral disc degeneration, lumbar region: Secondary | ICD-10-CM | POA: Diagnosis not present

## 2016-05-24 DIAGNOSIS — M62830 Muscle spasm of back: Secondary | ICD-10-CM | POA: Diagnosis not present

## 2016-05-24 DIAGNOSIS — M7061 Trochanteric bursitis, right hip: Secondary | ICD-10-CM | POA: Diagnosis not present

## 2016-05-24 DIAGNOSIS — M5416 Radiculopathy, lumbar region: Secondary | ICD-10-CM | POA: Diagnosis not present

## 2016-06-28 ENCOUNTER — Ambulatory Visit: Payer: Medicare HMO | Admitting: Internal Medicine

## 2016-06-28 DIAGNOSIS — H524 Presbyopia: Secondary | ICD-10-CM | POA: Diagnosis not present

## 2016-07-12 ENCOUNTER — Ambulatory Visit (INDEPENDENT_AMBULATORY_CARE_PROVIDER_SITE_OTHER): Payer: Medicare HMO | Admitting: Internal Medicine

## 2016-07-12 ENCOUNTER — Encounter: Payer: Self-pay | Admitting: Internal Medicine

## 2016-07-12 DIAGNOSIS — G8929 Other chronic pain: Secondary | ICD-10-CM

## 2016-07-12 DIAGNOSIS — I10 Essential (primary) hypertension: Secondary | ICD-10-CM | POA: Diagnosis not present

## 2016-07-12 DIAGNOSIS — K22719 Barrett's esophagus with dysplasia, unspecified: Secondary | ICD-10-CM

## 2016-07-12 DIAGNOSIS — Z85038 Personal history of other malignant neoplasm of large intestine: Secondary | ICD-10-CM | POA: Diagnosis not present

## 2016-07-12 DIAGNOSIS — R600 Localized edema: Secondary | ICD-10-CM

## 2016-07-12 DIAGNOSIS — M545 Low back pain: Secondary | ICD-10-CM | POA: Diagnosis not present

## 2016-07-12 DIAGNOSIS — E7800 Pure hypercholesterolemia, unspecified: Secondary | ICD-10-CM

## 2016-07-12 NOTE — Assessment & Plan Note (Signed)
Followed by Dr Chasnis.  Stable.   

## 2016-07-12 NOTE — Assessment & Plan Note (Signed)
Wearing compression hose.  No increased swelling.  Follow.

## 2016-07-12 NOTE — Assessment & Plan Note (Signed)
Blood pressure as outlined.  Outside checks as outlined.  Hold on making changes in medication.  Follow.

## 2016-07-12 NOTE — Assessment & Plan Note (Signed)
Colonoscopy 07/2010.  Followed by GI.

## 2016-07-12 NOTE — Assessment & Plan Note (Signed)
Followed by hematology 

## 2016-07-12 NOTE — Assessment & Plan Note (Signed)
On simvastatin.  Follow lipid panel and liver function tests.   

## 2016-07-12 NOTE — Assessment & Plan Note (Signed)
On protonix.  Doing well.  Followed by GI.  

## 2016-07-12 NOTE — Progress Notes (Signed)
Pre-visit discussion using our clinic review tool. No additional management support is needed unless otherwise documented below in the visit note.  

## 2016-07-12 NOTE — Progress Notes (Signed)
Patient ID: [name], female   DOB: Nov 28, 1928, 83 y.o.   MRN: [id number]   Subjective:    Patient ID: [name], female    DOB: 12/09/1928, 83 y.o.   MRN: [id number]  HPI  Patient here for a scheduled follow up.  She is accompanied by her husband.  History obtained from both of them.  She is doing well.  Eating well.  No nausea or vomiting.  No abdominal pain.  Bowels moving.  Brings in blood pressure readings.  Blood pressures averaging 130-140s/60-70s.  No chest pain.  No sob.  Overall she feels she is doing well.  Chronic back pain.  Sees Dr Sharlet Salina.     Past Medical History:  Diagnosis Date  . Barrett's esophagus   . Bowel obstruction ([facility])    s/p adhesion resection  . Chronic back pain   . Colon cancer ([place]) 1988 and 1989   adenocarcinoma, s/p resection x  2 and chemo  . Diverticulitis   . H/O open leg wound   . Hiatal hernia   . Hypercholesteremia   . Hypertension   . Lumbar scoliosis   . Peripheral neuropathy   . Recurrent sinus infections   . Renal cyst   . S/P chemotherapy, time since greater than 12 weeks    colon cancer  . Vertigo    Past Surgical History:  Procedure Laterality Date  . ABDOMINAL HYSTERECTOMY  1982  . adhesions resected     bowel obstruction  . APPENDECTOMY    . BREAST BIOPSY Left    negative 06/14/1985  . CHOLECYSTECTOMY    . COLON RESECTION     x2.  s/p colon cancer  . EXCISIONAL HEMORRHOIDECTOMY     with tubal ligation   Family History  Problem Relation Age of Onset  . Breast cancer Mother   . Asthma Mother   . Stroke Father   . Hypertension Father   . Diabetes Father   . Ovarian cancer Sister        x2  . Prostate cancer Brother   . Hypertension Brother        x3  . Hypercholesterolemia Brother        x3  . Diabetes Brother    Social History   Social History  . Marital status: Married    Spouse name: N/A  . Number of children: 4  . Years of education: 12th grade   Occupational History  . homemaker    Social  History Main Topics  . Smoking status: Never Smoker  . Smokeless tobacco: Never Used  . Alcohol use No  . Drug use: No  . Sexual activity: No   Other Topics Concern  . None   Social History Narrative  . None    Outpatient Encounter Prescriptions as of 07/12/2016  Medication Sig  . amLODipine (NORVASC) 10 MG tablet Take 1 tablet (10 mg total) by mouth daily.  . Calcium Carbonate-Vitamin D (CALCIUM 600 + D PO) Take 600 Units by mouth 2 (two) times daily with a meal.   . fexofenadine (ALLEGRA) 60 MG tablet Take 1 tablet (60 mg total) by mouth daily.  . fish oil-omega-3 fatty acids 1000 MG capsule Take 2 g by mouth daily.  . meclizine (ANTIVERT) 25 MG tablet Take 25 mg by mouth 3 (three) times daily as needed.  . meloxicam (MOBIC) 7.5 MG tablet Take 7.5 mg by mouth daily.  . metoprolol succinate (TOPROL-XL) 25 MG 24 hr tablet Take 1 tablet (  25 mg total) by mouth daily.  . ondansetron (ZOFRAN) 4 MG tablet Take 1 tablet (4 mg total) by mouth 2 (two) times daily as needed for nausea or vomiting.  Marland Kitchen oxyCODONE (OXY IR/ROXICODONE) 5 MG immediate release tablet Take 5 mg by mouth 3 (three) times daily.   . pantoprazole (PROTONIX) 40 MG tablet Take 40 mg by mouth 2 (two) times daily.  . simvastatin (ZOCOR) 10 MG tablet Take 1 tablet (10 mg total) by mouth at bedtime.  . traMADol-acetaminophen (ULTRACET) 37.5-325 MG per tablet Take 1 tablet by mouth 2 (two) times daily as needed.    No facility-administered encounter medications on file as of 07/12/2016.     Review of Systems  Constitutional: Negative for appetite change and unexpected weight change.  HENT: Negative for congestion and sinus pressure.   Respiratory: Negative for cough, chest tightness and shortness of breath.   Cardiovascular: Negative for chest pain and palpitations.       Wearing compression hose.  No increased swelling.   Gastrointestinal: Negative for abdominal pain, diarrhea, nausea and vomiting.  Genitourinary: Negative  for difficulty urinating and dysuria.  Musculoskeletal: Positive for back pain. Negative for joint swelling.  Skin: Negative for color change and rash.  Neurological: Negative for dizziness, light-headedness and headaches.  Psychiatric/Behavioral: Negative for agitation and dysphoric mood.       Objective:    Physical Exam  Constitutional: She appears well-developed and well-nourished. No distress.  HENT:  Nose: Nose normal.  Mouth/Throat: Oropharynx is clear and moist.  Neck: Neck supple. No thyromegaly present.  Cardiovascular: Normal rate and regular rhythm.   Pulmonary/Chest: Breath sounds normal. No respiratory distress. She has no wheezes.  Abdominal: Soft. Bowel sounds are normal. There is no tenderness.  Musculoskeletal: She exhibits no edema or tenderness.  Lymphadenopathy:    She has no cervical adenopathy.  Skin: No rash noted. No erythema.  Psychiatric: She has a normal mood and affect. Her behavior is normal.    BP 140/86 (BP Location: Left Arm, Patient Position: Sitting, Cuff Size: Normal)   Pulse 61   Temp 98.6 F (37 C) (Oral)   Resp 12   Ht 5\' 1"  (1.549 m)   Wt 138 lb 12.8 oz (63 kg)   SpO2 97%   BMI 26.23 kg/m  Wt Readings from Last 3 Encounters:  07/12/16 138 lb 12.8 oz (63 kg)  04/21/16 134 lb 3.2 oz (60.9 kg)  04/13/16 136 lb 0.4 oz (61.7 kg)     Lab Results  Component Value Date   WBC 16.5 (H) 04/12/2016   HGB 14.4 04/12/2016   HCT 41.4 04/12/2016   PLT 290 04/12/2016   GLUCOSE 94 04/20/2016   CHOL 166 04/20/2016   TRIG 136.0 04/20/2016   HDL 78.10 04/20/2016   LDLCALC 61 04/20/2016   ALT 22 04/20/2016   AST 26 04/20/2016   NA 140 04/20/2016   K 4.0 04/20/2016   CL 101 04/20/2016   CREATININE 0.93 04/20/2016   BUN 21 04/20/2016   CO2 29 04/20/2016   TSH 2.07 04/20/2016   INR 0.91 06/11/2015   HGBA1C 5.3 04/12/2016    Ct Abdomen Pelvis W Contrast  Result Date: 04/13/2016 CLINICAL DATA:  Abdominal pain for 1 day.  Vomiting.  EXAM: CT ABDOMEN AND PELVIS WITH CONTRAST TECHNIQUE: Multidetector CT imaging of the abdomen and pelvis was performed using the standard protocol following bolus administration of intravenous contrast. CONTRAST:  53mL ISOVUE-300 IOPAMIDOL (ISOVUE-300) INJECTION 61% COMPARISON:  CT 11/24/2015 FINDINGS:  Lower chest: No consolidation or pleural fluid. Moderate hiatal hernia. Hepatobiliary: Chronic biliary dilatation with common bile duct measuring 12 mm at the porta hepatis. This is unchanged from prior exam. Postcholecystectomy. No focal hepatic lesion. Pancreas: Chronic proximal pancreatic ductal dilatation measuring up to 7 mm. No peripancreatic inflammation. No evidence pancreatitis. Spleen: Splenic granuloma.  Normal in size. Adrenals/Urinary Tract: Normal adrenal glands. No hydronephrosis. Symmetric renal enhancement and excretion on delayed phase imaging. Unchanged cyst in the upper left kidney measuring 5.3 cm with peripheral calcification. Scattered cortical hypodensities throughout both kidneys. Urinary bladder is physiologically distended without wall thickening. Stomach/Bowel: Moderate hiatal hernia. Stomach physiologically distended with enteric contrast. Distal small bowel loops are fluid-filled and borderline dilated. Fluid within the transverse transverse colon, post right hemicolectomy. Moderate stool in the more distal colon. Minimal mesenteric edema of fluid-filled lower bowel loops, no free fluid. No pneumatosis. Vascular/Lymphatic: Aortic atherosclerosis and tortuosity. No aneurysm no adenopathy. Reproductive: Status post hysterectomy. No adnexal masses. Other: No free air.  No intra-abdominal abscess. Musculoskeletal: Scoliotic curvature of the spine with degenerative change. There are no acute or suspicious osseous abnormalities. IMPRESSION: 1. Fluid-filled distal small bowel which is borderline dilated. Additionally there is fluid in the proximal colon to the mid transverse, with moderate stool  distally. Overall findings suggest generalized ileus and slow transit, less likely early small bowel obstruction. The discrete transition seen on prior exam is not present currently. 2. Chronic pancreatic and biliary ductal dilatation, stable. 3. Moderate hiatal hernia. Electronically Signed   By: Jeb Levering M.D.   On: 04/13/2016 01:54       Assessment & Plan:   Problem List Items Addressed This Visit    Barrett's esophagus    On protonix.  Doing well.  Followed by GI.       Bilateral lower extremity edema    Wearing compression hose.  No increased swelling.  Follow.        Chronic back pain    Followed by Dr Sharlet Salina.  Stable.       Hemochromatosis    Followed by hematology.        History of colon cancer    Colonoscopy 07/2010.  Followed by GI.       Hypercholesteremia    On simvastatin.  Follow lipid panel and liver function tests.        Relevant Orders   Hepatic function panel   Lipid panel   Hypertension    Blood pressure as outlined.  Outside checks as outlined.  Hold on making changes in medication.  Follow.        Relevant Orders   CBC with Differential/Platelet   TSH   Basic metabolic panel       [name], MD

## 2016-08-01 DIAGNOSIS — M25552 Pain in left hip: Secondary | ICD-10-CM | POA: Diagnosis not present

## 2016-08-01 DIAGNOSIS — M62830 Muscle spasm of back: Secondary | ICD-10-CM | POA: Diagnosis not present

## 2016-08-01 DIAGNOSIS — M5136 Other intervertebral disc degeneration, lumbar region: Secondary | ICD-10-CM | POA: Diagnosis not present

## 2016-08-01 DIAGNOSIS — M5416 Radiculopathy, lumbar region: Secondary | ICD-10-CM | POA: Diagnosis not present

## 2016-08-11 ENCOUNTER — Emergency Department: Payer: Medicare HMO

## 2016-08-11 ENCOUNTER — Emergency Department
Admission: EM | Admit: 2016-08-11 | Discharge: 2016-08-11 | Disposition: A | Discharge status: Home / Self Care | Payer: Medicare HMO | Attending: Emergency Medicine | Admitting: Emergency Medicine

## 2016-08-11 DIAGNOSIS — R1084 Generalized abdominal pain: Secondary | ICD-10-CM

## 2016-08-11 DIAGNOSIS — K5900 Constipation, unspecified: Secondary | ICD-10-CM | POA: Diagnosis not present

## 2016-08-11 DIAGNOSIS — Z79899 Other long term (current) drug therapy: Secondary | ICD-10-CM | POA: Insufficient documentation

## 2016-08-11 DIAGNOSIS — I10 Essential (primary) hypertension: Secondary | ICD-10-CM | POA: Diagnosis not present

## 2016-08-11 DIAGNOSIS — Z85038 Personal history of other malignant neoplasm of large intestine: Secondary | ICD-10-CM | POA: Diagnosis not present

## 2016-08-11 DIAGNOSIS — R109 Unspecified abdominal pain: Secondary | ICD-10-CM | POA: Diagnosis not present

## 2016-08-11 LAB — CBC WITH DIFFERENTIAL/PLATELET
Basophils Absolute: 0.1 10*3/uL (ref 0–0.1)
Basophils Relative: 1 %
EOS PCT: 1 %
Eosinophils Absolute: 0.1 10*3/uL (ref 0–0.7)
HCT: 38.5 % (ref 35.0–47.0)
HEMOGLOBIN: 13.2 g/dL (ref 12.0–16.0)
LYMPHS PCT: 9 %
Lymphs Abs: 1 10*3/uL (ref 1.0–3.6)
MCH: 33 pg (ref 26.0–34.0)
MCHC: 34.2 g/dL (ref 32.0–36.0)
MCV: 96.3 fL (ref 80.0–100.0)
MONOS PCT: 8 %
Monocytes Absolute: 0.9 10*3/uL (ref 0.2–0.9)
Neutro Abs: 9.4 10*3/uL — ABNORMAL HIGH (ref 1.4–6.5)
Neutrophils Relative %: 81 %
PLATELETS: 294 10*3/uL (ref 150–440)
RBC: 4 MIL/uL (ref 3.80–5.20)
RDW: 13.6 % (ref 11.5–14.5)
WBC: 11.6 10*3/uL — AB (ref 3.6–11.0)

## 2016-08-11 LAB — COMPREHENSIVE METABOLIC PANEL
ALK PHOS: 41 U/L (ref 38–126)
ALT: 16 U/L (ref 14–54)
ANION GAP: 6 (ref 5–15)
AST: 24 U/L (ref 15–41)
Albumin: 3.8 g/dL (ref 3.5–5.0)
BUN: 25 mg/dL — ABNORMAL HIGH (ref 6–20)
CALCIUM: 9.2 mg/dL (ref 8.9–10.3)
CO2: 27 mmol/L (ref 22–32)
CREATININE: 0.78 mg/dL (ref 0.44–1.00)
Chloride: 105 mmol/L (ref 101–111)
Glucose, Bld: 100 mg/dL — ABNORMAL HIGH (ref 65–99)
Potassium: 3.8 mmol/L (ref 3.5–5.1)
SODIUM: 138 mmol/L (ref 135–145)
Total Bilirubin: 1.3 mg/dL — ABNORMAL HIGH (ref 0.3–1.2)
Total Protein: 7.1 g/dL (ref 6.5–8.1)

## 2016-08-11 LAB — URINALYSIS, COMPLETE (UACMP) WITH MICROSCOPIC
Bacteria, UA: NONE SEEN
Bilirubin Urine: NEGATIVE
GLUCOSE, UA: NEGATIVE mg/dL
Hgb urine dipstick: NEGATIVE
Ketones, ur: NEGATIVE mg/dL
NITRITE: NEGATIVE
PH: 8 (ref 5.0–8.0)
Protein, ur: 30 mg/dL — AB
RBC / HPF: NONE SEEN RBC/hpf (ref 0–5)
SPECIFIC GRAVITY, URINE: 1.012 (ref 1.005–1.030)

## 2016-08-11 LAB — LIPASE, BLOOD: LIPASE: 26 U/L (ref 11–51)

## 2016-08-11 MED ORDER — ONDANSETRON HCL 4 MG/2ML IJ SOLN
4.0000 mg | Freq: Once | INTRAMUSCULAR | Status: AC
  Administered 2016-08-11: 4 mg via INTRAVENOUS
  Filled 2016-08-11: qty 2

## 2016-08-11 MED ORDER — SODIUM CHLORIDE 0.9 % IV BOLUS (SEPSIS)
1000.0000 mL | Freq: Once | INTRAVENOUS | Status: AC
  Administered 2016-08-11: 1000 mL via INTRAVENOUS

## 2016-08-11 MED ORDER — IOPAMIDOL (ISOVUE-300) INJECTION 61%
100.0000 mL | Freq: Once | INTRAVENOUS | Status: AC | PRN
  Administered 2016-08-11: 100 mL via INTRAVENOUS

## 2016-08-11 NOTE — ED Triage Notes (Signed)
Per EMS, pt has hx of abd surgeries and states this morning she developed abd pain and tightness. Pt states she has hx of bowel obstructions from said abd surgeries. Pt states last normal BM was 08/09/16. EMS reports VS WNL with hx of HTN, pt denies taking AM blood pressure meds this morning. Pt A&O at this time.

## 2016-08-11 NOTE — ED Notes (Signed)
Pt resting in bed, family at beside, denies any needs

## 2016-08-11 NOTE — ED Notes (Signed)
Soap suds enema given

## 2016-08-11 NOTE — ED Notes (Signed)
Pt states relief after enema, pt had BM, resting in bed in no distress, family at bedside

## 2016-08-11 NOTE — ED Provider Notes (Signed)
[facility] Emergency Department Provider Note  ____________________________________________  Time seen: Approximately 7:23 AM  I have reviewed the triage vital signs and the nursing notes.   HISTORY  Chief Complaint Abdominal Pain   HPI [name] is a 83 y.o. female with a history of recurrent SBO's, colon cancer status post resection 2 in the 80s, cholecystectomy, hysterectomy, hypertension, diverticulitis who presents for evaluation of abdominal pain. Patient reports that the pain started this morning, cramping, severe, generalized, mild at this time. Associated with nausea and one episode of vomiting. Last normal BM was 2 days ago. This morning she says she had a very small amount of stool. She does feel like her abdomen is distended and is not passing gas since this morning which made her concerned she had another SBO. No dysuria hematuria, no chest pain, no fever or chills.  Past Medical History:  Diagnosis Date  . Barrett's esophagus   . Bowel obstruction (HCC)    s/p adhesion resection  . Chronic back pain   . Colon cancer ([place]) 1988 and 1989   adenocarcinoma, s/p resection x  2 and chemo  . Diverticulitis   . H/O open leg wound   . Hiatal hernia   . Hypercholesteremia   . Hypertension   . Lumbar scoliosis   . Peripheral neuropathy   . Recurrent sinus infections   . Renal cyst   . S/P chemotherapy, time since greater than 12 weeks    colon cancer  . Vertigo     Patient Active Problem List   Diagnosis Date Noted  . Small bowel obstruction ([place]) 11/24/2015  . Swelling of left lower extremity 07/16/2015  . Acute Ileitis 06/12/2015  . Hypokalemia 06/12/2015  . Accelerated hypertension 06/12/2015  . Hemochromatosis 05/25/2015  . Leg wound, right 05/19/2015  . Bilateral lower extremity edema 05/19/2015  . Iron excess 04/16/2015  . URI (upper respiratory infection) 01/18/2015  . Dizziness 10/26/2014  . Hyperbilirubinemia 10/26/2014    . Health care maintenance 05/18/2014  . History of pneumonia 05/18/2014  . Anemia, iron deficiency 04/21/2014  . Hospital discharge follow-up 04/14/2014  . Sweats, menopausal 03/07/2014  . Face lesion 11/03/2013  . Numbness in feet 10/08/2012  . Nocturia 10/08/2012  . Hypertension 12/18/2011  . Hypercholesteremia 12/18/2011  . History of colon cancer 12/18/2011  . Barrett's esophagus 12/18/2011  . Chronic back pain 12/18/2011  . Osteopenia 12/18/2011    Past Surgical History:  Procedure Laterality Date  . ABDOMINAL HYSTERECTOMY  1982  . adhesions resected     bowel obstruction  . APPENDECTOMY    . BREAST BIOPSY Left    negative 06/14/1985  . CHOLECYSTECTOMY    . COLON RESECTION     x2.  s/p colon cancer  . EXCISIONAL HEMORRHOIDECTOMY     with tubal ligation    Prior to Admission medications   Medication Sig Start Date End Date Taking? Authorizing Provider  amLODipine (NORVASC) 10 MG tablet Take 1 tablet (10 mg total) by mouth daily. 04/25/16  Yes [name], MD  Calcium Carbonate-Vitamin D (CALCIUM 600 + D PO) Take 600 Units by mouth 2 (two) times daily with a meal.    Yes [provider]  fexofenadine (ALLEGRA) 60 MG tablet Take 1 tablet (60 mg total) by mouth daily. 04/25/16  Yes [name], MD  fish oil-omega-3 fatty acids 1000 MG capsule Take 2 g by mouth daily.   Yes [provider]  meclizine (ANTIVERT) 25 MG tablet Take  25 mg by mouth 3 (three) times daily as needed.   Yes [provider]  meloxicam (MOBIC) 7.5 MG tablet Take 7.5 mg by mouth daily.   Yes [provider]  metoprolol succinate (TOPROL-XL) 25 MG 24 hr tablet Take 1 tablet (25 mg total) by mouth daily. 04/25/16  Yes [name], MD  oxyCODONE (OXY IR/ROXICODONE) 5 MG immediate release tablet Take 5 mg by mouth 3 (three) times daily.    Yes [provider]  pantoprazole (PROTONIX) 40 MG tablet Take 40 mg by mouth 2 (two) times daily.   Yes [provider]  simvastatin (ZOCOR) 10 MG tablet Take 1 tablet (10 mg total) by mouth at bedtime. 04/25/16  Yes [name], MD  traMADol-acetaminophen (ULTRACET) 37.5-325 MG per tablet Take 1 tablet by mouth 2 (two) times daily as needed.    Yes [provider]  ondansetron (ZOFRAN) 4 MG tablet Take 1 tablet (4 mg total) by mouth 2 (two) times daily as needed for nausea or vomiting. Patient not taking: Reported on 08/11/2016 06/25/15   [name], MD    Allergies Patient has no known allergies.  Family History  Problem Relation Age of Onset  . Breast cancer Mother   . Asthma Mother   . Stroke Father   . Hypertension Father   . Diabetes Father   . Ovarian cancer Sister        x2  . Prostate cancer Brother   . Hypertension Brother        x3  . Hypercholesterolemia Brother        x3  . Diabetes Brother     Social History Social History  Substance Use Topics  . Smoking status: Never Smoker  . Smokeless tobacco: Never Used  . Alcohol use No    Review of Systems  Constitutional: Negative for fever. Eyes: Negative for visual changes. ENT: Negative for sore throat. Neck: No neck pain  Cardiovascular: Negative for chest pain. Respiratory: Negative for shortness of breath. Gastrointestinal: + generalized abdominal pain, nausea, and vomiting. No diarrhea. Genitourinary: Negative for dysuria. Musculoskeletal: Negative for back pain. Skin: Negative for rash. Neurological: Negative for headaches, weakness or numbness. Psych: No SI or HI  ____________________________________________   PHYSICAL EXAM:  VITAL SIGNS: ED Triage Vitals  Enc Vitals Group     BP 08/11/16 0638 (!) 176/77     Pulse Rate 08/11/16 0638 87     Resp 08/11/16 0638 18     Temp 08/11/16 0638 98.5 F (36.9 C)     Temp Source 08/11/16 0638 Oral     SpO2 08/11/16 0638 98 %     Weight 08/11/16 0638 137 lb (62.1 kg)     Height 08/11/16 0638 5\' 1"  (1.549 m)     Head Circumference --       Peak Flow --      Pain Score 08/11/16 0637 10     Pain Loc --      Pain Edu? --      Excl. in [place]? --     Constitutional: Alert and oriented. Well appearing and in no apparent distress. HEENT:      Head: Normocephalic and atraumatic.         Eyes: Conjunctivae are normal. Sclera is non-icteric.       Mouth/Throat: Mucous membranes are moist.       Neck: Supple with no signs of meningismus. Cardiovascular: Regular rate and rhythm. No murmurs, gallops, or rubs. 2+ symmetrical distal  pulses are present in all extremities. No JVD. Respiratory: Normal respiratory effort. Lungs are clear to auscultation bilaterally. No wheezes, crackles, or rhonchi.  Gastrointestinal: Abdomen is mildly distended, soft, diffuse mild tenderness throughout with no rebound or guarding, positive bowel sounds  Musculoskeletal: Nontender with normal range of motion in all extremities. No edema, cyanosis, or erythema of extremities. Neurologic: Normal speech and language. Face is symmetric. Moving all extremities. No gross focal neurologic deficits are appreciated. Skin: Skin is warm, dry and intact. No rash noted. Psychiatric: Mood and affect are normal. Speech and behavior are normal.  ____________________________________________   LABS (all labs ordered are listed, but only abnormal results are displayed)  Labs Reviewed  CBC WITH DIFFERENTIAL/PLATELET - Abnormal; Notable for the following:       Result Value   WBC 11.6 (*)    Neutro Abs 9.4 (*)    All other components within normal limits  COMPREHENSIVE METABOLIC PANEL - Abnormal; Notable for the following:    Glucose, Bld 100 (*)    BUN 25 (*)    Total Bilirubin 1.3 (*)    All other components within normal limits  URINALYSIS, COMPLETE (UACMP) WITH MICROSCOPIC - Abnormal; Notable for the following:    Color, Urine YELLOW (*)    APPearance CLOUDY (*)    Protein, ur 30 (*)    Leukocytes, UA SMALL (*)    Squamous Epithelial / LPF 0-5 (*)    All other  components within normal limits  LIPASE, BLOOD   ____________________________________________  EKG  none  ____________________________________________  RADIOLOGY  CT a/p: 1. No recurrent small bowel obstruction at this time, but there is mild nonspecific inflammation in the distal small bowel mesentery (series 2, image 52). Top differential considerations for this patient include an infectious enteritis versus a recent obstruction which has spontaneously regressed. 2. Trace pelvic free fluid, likely reactive to #1. No abdominal free air or other free fluid. 3. Otherwise stable postoperative appearance of the abdomen and pelvis. 4. Calcified aortic atherosclerosis. ____________________________________________   PROCEDURES  Procedure(s) performed: None Procedures Critical Care performed:  None ____________________________________________   INITIAL IMPRESSION / ASSESSMENT AND PLAN / ED COURSE  83 y.o. female with a history of recurrent SBO's, colon cancer status post resection 2 in the 80s, cholecystectomy, hysterectomy, hypertension, diverticulitis who presents for evaluation of abdominal pain. Patient is well-appearing, in no distress, normal vital signs, her abdomen is slightly distended with diffuse tenderness, positive bowel sounds, no rebound or guarding. Differential diagnoses including SBO versus constipation versus diverticulitis. We'll get basic blood work, urinalysis, and CT abdomen and pelvis.    _________________________ 9:43 AM on 08/11/2016 ----------------------------------------- Labs and urine without acute findings. CT scan concerning for possible resolved SBO. Patient remains pain-free and has not received any pain medications in the emergency room. CT shows significant amount of stool burden. Patient will be given an enema and she is going to be discharged home on MiraLAX and close follow-up with primary care doctor.  _________________________ 1:12 PM on  08/11/2016 -----------------------------------------  Patient had soap suds enema with a moderate size bowel movement. She remains with no pain and normal bowel sounds, abdomen soft and nontender. Patient can be discharged home on Colace, senna, and MiraLAX when necessary.  Pertinent labs & imaging results that were available during my care of the patient were reviewed by me and considered in my medical decision making (see chart for details).    ____________________________________________   FINAL CLINICAL IMPRESSION(S) / ED DIAGNOSES  Final diagnoses:  Generalized abdominal pain  Constipation, unspecified constipation type      NEW MEDICATIONS STARTED DURING THIS VISIT:  New Prescriptions   No medications on file     Note:  This document was prepared using Dragon voice recognition software and may include unintentional dictation errors.    Alfred Levins, [place], MD 08/11/16 [phone]

## 2016-08-11 NOTE — ED Notes (Signed)
EDP at bedside for update 

## 2016-08-11 NOTE — Discharge Instructions (Signed)
Constipation: Take colace twice a day everyday. Take senna once a day at bedtime. Take daily probiotics. Drink plenty of fluids and eat a diet rich in fiber. If you go more than 3 days without a bowel movement, take 1 cap full of Miralax in the morning and one in the evening up to 5 days.  ° °

## 2016-08-15 ENCOUNTER — Ambulatory Visit (INDEPENDENT_AMBULATORY_CARE_PROVIDER_SITE_OTHER): Payer: Medicare HMO | Admitting: Family

## 2016-08-15 ENCOUNTER — Encounter: Payer: Self-pay | Admitting: Family

## 2016-08-15 VITALS — BP 134/80 | HR 84 | Temp 98.5°F | Ht 61.0 in | Wt 137.4 lb

## 2016-08-15 DIAGNOSIS — K5900 Constipation, unspecified: Secondary | ICD-10-CM | POA: Diagnosis not present

## 2016-08-15 NOTE — Progress Notes (Signed)
Pre visit review using our clinic review tool, if applicable. No additional management support is needed unless otherwise documented below in the visit note. 

## 2016-08-15 NOTE — Progress Notes (Signed)
Subjective:    Patient ID: [name], female    DOB: 1928/05/30, 83 y.o.   MRN: [id number]  CC: [name] is a 83 y.o. female who presents today for follow up.   HPI: 6/28 abdominal pain; seen at [facility]  Normal BM today. No diarrhea. Non bloody. No abdominal pain. Passed gas this morning. No fever, nausea, vomiting.   Taking miralax qam, colace BID, senna qpm with resolve.   H/o recurrent sbos, follows with Dr Tiffany Kocher. Advised to not eat raw vegetables.   Ct abdomen: no sbo, mild inflammation of mesentary, atherosclerorsis Given suds edema with BM.  Dc/ed on miralex, colace, senna.   H/o colon cancer  No h/o diverticulitis  No longer does colonoscopy  Chronic pain; on opioids; follows with [facility]    Differentials included infectious enteritis versus recent obstruction which has regressed  HISTORY:  Past Medical History:  Diagnosis Date  . Barrett's esophagus   . Bowel obstruction (HCC)    s/p adhesion resection  . Chronic back pain   . Colon cancer ([place]) 1988 and 1989   adenocarcinoma, s/p resection x  2 and chemo  . Diverticulitis   . H/O open leg wound   . Hiatal hernia   . Hypercholesteremia   . Hypertension   . Lumbar scoliosis   . Peripheral neuropathy   . Recurrent sinus infections   . Renal cyst   . S/P chemotherapy, time since greater than 12 weeks    colon cancer  . Vertigo    Past Surgical History:  Procedure Laterality Date  . ABDOMINAL HYSTERECTOMY  1982  . adhesions resected     bowel obstruction  . APPENDECTOMY    . BREAST BIOPSY Left    negative 06/14/1985  . CHOLECYSTECTOMY    . COLON RESECTION     x2.  s/p colon cancer  . EXCISIONAL HEMORRHOIDECTOMY     with tubal ligation   Family History  Problem Relation Age of Onset  . Breast cancer Mother   . Asthma Mother   . Stroke Father   . Hypertension Father   . Diabetes Father   . Ovarian cancer Sister        x2  . Prostate cancer Brother   . Hypertension Brother        x3   . Hypercholesterolemia Brother        x3  . Diabetes Brother     Allergies: Patient has no known allergies. Current Outpatient Prescriptions on File Prior to Visit  Medication Sig Dispense Refill  . amLODipine (NORVASC) 10 MG tablet Take 1 tablet (10 mg total) by mouth daily. 90 tablet 1  . Calcium Carbonate-Vitamin D (CALCIUM 600 + D PO) Take 600 Units by mouth 2 (two) times daily with a meal.     . fexofenadine (ALLEGRA) 60 MG tablet Take 1 tablet (60 mg total) by mouth daily. 90 tablet 3  . fish oil-omega-3 fatty acids 1000 MG capsule Take 2 g by mouth daily.    . meclizine (ANTIVERT) 25 MG tablet Take 25 mg by mouth 3 (three) times daily as needed.    . meloxicam (MOBIC) 7.5 MG tablet Take 7.5 mg by mouth daily.    . metoprolol succinate (TOPROL-XL) 25 MG 24 hr tablet Take 1 tablet (25 mg total) by mouth daily. 90 tablet 3  . oxyCODONE (OXY IR/ROXICODONE) 5 MG immediate release tablet Take 5 mg by mouth 3 (three) times daily.     . pantoprazole (  PROTONIX) 40 MG tablet Take 40 mg by mouth 2 (two) times daily.    . simvastatin (ZOCOR) 10 MG tablet Take 1 tablet (10 mg total) by mouth at bedtime. 90 tablet 3  . traMADol-acetaminophen (ULTRACET) 37.5-325 MG per tablet Take 1 tablet by mouth 2 (two) times daily as needed.     . ondansetron (ZOFRAN) 4 MG tablet Take 1 tablet (4 mg total) by mouth 2 (two) times daily as needed for nausea or vomiting. (Patient not taking: Reported on 08/15/2016) 30 tablet 0   No current facility-administered medications on file prior to visit.     Social History  Substance Use Topics  . Smoking status: Never Smoker  . Smokeless tobacco: Never Used  . Alcohol use No    Review of Systems  Constitutional: Negative for chills and fever.  Respiratory: Negative for cough.   Cardiovascular: Negative for chest pain and palpitations.  Gastrointestinal: Negative for abdominal distention, abdominal pain, blood in stool, constipation, diarrhea, nausea and  vomiting.      Objective:    BP (!) 146/76   Pulse 84   Temp 98.5 F (36.9 C) (Oral)   Ht 5\' 1"  (1.549 m)   Wt 137 lb 6.4 oz (62.3 kg)   SpO2 97%   BMI 25.96 kg/m  BP Readings from Last 3 Encounters:  08/15/16 (!) 146/76  08/11/16 (!) 145/47  07/12/16 140/86   Wt Readings from Last 3 Encounters:  08/15/16 137 lb 6.4 oz (62.3 kg)  08/11/16 137 lb (62.1 kg)  07/12/16 138 lb 12.8 oz (63 kg)    Physical Exam  Constitutional: She appears well-developed and well-nourished.  Eyes: Conjunctivae are normal.  Cardiovascular: Normal rate, regular rhythm, normal heart sounds and normal pulses.   Pulmonary/Chest: Effort normal and breath sounds normal. She has no wheezes. She has no rhonchi. She has no rales.  Abdominal: Soft. Normal appearance and bowel sounds are normal. She exhibits no distension, no fluid wave, no ascites and no mass. There is no tenderness. There is no rigidity, no rebound, no guarding and no CVA tenderness.  Neurological: She is alert.  Skin: Skin is warm and dry.  Psychiatric: She has a normal mood and affect. Her speech is normal and behavior is normal. Thought content normal.  Vitals reviewed.      Assessment & Plan:   Problem List Items Addressed This Visit      Digestive   Constipation - Primary    Resolved at this time. Benign abdominal exam. Education provided on side effects of narcotics as likely contributing to constipation. Patient's regimen- MiraLAX, Colace,senna appears to be working this time. Encouraged water and advised follow-up with her gastroenterologist, Dr. Tiffany Kocher. Patient will let me know if constipation, abdominal pain recurs.          I am having [name] maintain her oxyCODONE, meloxicam, traMADol-acetaminophen, pantoprazole, Calcium Carbonate-Vitamin D (CALCIUM 600 + D PO), meclizine, fish oil-omega-3 fatty acids, ondansetron, amLODipine, fexofenadine, metoprolol succinate, and simvastatin.   No orders of the defined types  were placed in this encounter.   Return precautions given.   Risks, benefits, and alternatives of the medications and treatment plan prescribed today were discussed, and patient expressed understanding.   Education regarding symptom management and diagnosis given to patient on AVS.  Continue to follow with [name], MD for routine health maintenance.   [name] and I agreed with plan.   Mable Paris, FNP

## 2016-08-15 NOTE — Patient Instructions (Signed)
Pleased that you are doing better.  Please let me know if you have abdominal pain, constipation  As discussed, ensure you follow up with dr Vira Agar as well

## 2016-08-15 NOTE — Assessment & Plan Note (Signed)
Resolved at this time. Benign abdominal exam. Education provided on side effects of narcotics as likely contributing to constipation. Patient's regimen- MiraLAX, Colace,senna appears to be working this time. Encouraged water and advised follow-up with her gastroenterologist, Dr. Tiffany Kocher. Patient will let me know if constipation, abdominal pain recurs.

## 2016-09-08 DIAGNOSIS — L03032 Cellulitis of left toe: Secondary | ICD-10-CM | POA: Diagnosis not present

## 2016-09-08 DIAGNOSIS — M2041 Other hammer toe(s) (acquired), right foot: Secondary | ICD-10-CM | POA: Diagnosis not present

## 2016-09-08 DIAGNOSIS — L03031 Cellulitis of right toe: Secondary | ICD-10-CM | POA: Diagnosis not present

## 2016-09-15 DIAGNOSIS — M1612 Unilateral primary osteoarthritis, left hip: Secondary | ICD-10-CM | POA: Diagnosis not present

## 2016-10-07 DIAGNOSIS — M7062 Trochanteric bursitis, left hip: Secondary | ICD-10-CM | POA: Diagnosis not present

## 2016-10-07 DIAGNOSIS — M5136 Other intervertebral disc degeneration, lumbar region: Secondary | ICD-10-CM | POA: Diagnosis not present

## 2016-10-07 DIAGNOSIS — M5416 Radiculopathy, lumbar region: Secondary | ICD-10-CM | POA: Diagnosis not present

## 2016-10-13 ENCOUNTER — Other Ambulatory Visit (INDEPENDENT_AMBULATORY_CARE_PROVIDER_SITE_OTHER): Payer: Medicare HMO

## 2016-10-13 DIAGNOSIS — E7800 Pure hypercholesterolemia, unspecified: Secondary | ICD-10-CM

## 2016-10-13 DIAGNOSIS — I10 Essential (primary) hypertension: Secondary | ICD-10-CM

## 2016-10-13 LAB — HEPATIC FUNCTION PANEL
ALK PHOS: 47 U/L (ref 39–117)
ALT: 14 U/L (ref 0–35)
AST: 18 U/L (ref 0–37)
Albumin: 4.3 g/dL (ref 3.5–5.2)
BILIRUBIN TOTAL: 1 mg/dL (ref 0.2–1.2)
Bilirubin, Direct: 0.2 mg/dL (ref 0.0–0.3)
Total Protein: 7 g/dL (ref 6.0–8.3)

## 2016-10-13 LAB — CBC WITH DIFFERENTIAL/PLATELET
Basophils Absolute: 0.1 10*3/uL (ref 0.0–0.1)
Basophils Relative: 0.8 % (ref 0.0–3.0)
EOS PCT: 2.8 % (ref 0.0–5.0)
Eosinophils Absolute: 0.2 10*3/uL (ref 0.0–0.7)
HCT: 40.8 % (ref 36.0–46.0)
HEMOGLOBIN: 13.8 g/dL (ref 12.0–15.0)
Lymphocytes Relative: 22.2 % (ref 12.0–46.0)
Lymphs Abs: 1.5 10*3/uL (ref 0.7–4.0)
MCHC: 33.7 g/dL (ref 30.0–36.0)
MCV: 97.7 fl (ref 78.0–100.0)
MONO ABS: 0.6 10*3/uL (ref 0.1–1.0)
MONOS PCT: 8.5 % (ref 3.0–12.0)
Neutro Abs: 4.6 10*3/uL (ref 1.4–7.7)
Neutrophils Relative %: 65.7 % (ref 43.0–77.0)
Platelets: 320 10*3/uL (ref 150.0–400.0)
RBC: 4.18 Mil/uL (ref 3.87–5.11)
RDW: 13.4 % (ref 11.5–15.5)
WBC: 7 10*3/uL (ref 4.0–10.5)

## 2016-10-13 LAB — BASIC METABOLIC PANEL
BUN: 23 mg/dL (ref 6–23)
CALCIUM: 10.1 mg/dL (ref 8.4–10.5)
CO2: 30 meq/L (ref 19–32)
Chloride: 103 mEq/L (ref 96–112)
Creatinine, Ser: 0.8 mg/dL (ref 0.40–1.20)
GFR: 72.34 mL/min (ref >60.00)
Glucose, Bld: 92 mg/dL (ref 70–99)
Potassium: 3.9 mEq/L (ref 3.5–5.1)
SODIUM: 140 meq/L (ref 135–145)

## 2016-10-13 LAB — LIPID PANEL
CHOL/HDL RATIO: 2
CHOLESTEROL: 165 mg/dL (ref 0–200)
HDL: 86.4 mg/dL (ref >39.00)
LDL CALC: 57 mg/dL (ref 0–99)
NONHDL: 78.84
Triglycerides: 109 mg/dL (ref 0.0–149.0)
VLDL: 21.8 mg/dL (ref 0.0–40.0)

## 2016-10-13 LAB — TSH: TSH: 1.84 u[IU]/mL (ref 0.35–4.50)

## 2016-10-18 ENCOUNTER — Encounter: Payer: Self-pay | Admitting: Internal Medicine

## 2016-10-18 ENCOUNTER — Ambulatory Visit (INDEPENDENT_AMBULATORY_CARE_PROVIDER_SITE_OTHER): Payer: Medicare HMO | Admitting: Internal Medicine

## 2016-10-18 VITALS — BP 136/70 | HR 66 | Temp 98.5°F | Resp 16 | Ht 61.02 in | Wt 138.2 lb

## 2016-10-18 DIAGNOSIS — Z23 Encounter for immunization: Secondary | ICD-10-CM | POA: Diagnosis not present

## 2016-10-18 DIAGNOSIS — E7800 Pure hypercholesterolemia, unspecified: Secondary | ICD-10-CM

## 2016-10-18 DIAGNOSIS — M545 Low back pain: Secondary | ICD-10-CM

## 2016-10-18 DIAGNOSIS — D509 Iron deficiency anemia, unspecified: Secondary | ICD-10-CM

## 2016-10-18 DIAGNOSIS — Z1239 Encounter for other screening for malignant neoplasm of breast: Secondary | ICD-10-CM

## 2016-10-18 DIAGNOSIS — K22719 Barrett's esophagus with dysplasia, unspecified: Secondary | ICD-10-CM

## 2016-10-18 DIAGNOSIS — Z0000 Encounter for general adult medical examination without abnormal findings: Secondary | ICD-10-CM

## 2016-10-18 DIAGNOSIS — R351 Nocturia: Secondary | ICD-10-CM | POA: Diagnosis not present

## 2016-10-18 DIAGNOSIS — Z1231 Encounter for screening mammogram for malignant neoplasm of breast: Secondary | ICD-10-CM

## 2016-10-18 DIAGNOSIS — G8929 Other chronic pain: Secondary | ICD-10-CM | POA: Diagnosis not present

## 2016-10-18 DIAGNOSIS — I10 Essential (primary) hypertension: Secondary | ICD-10-CM

## 2016-10-18 DIAGNOSIS — Z0001 Encounter for general adult medical examination with abnormal findings: Secondary | ICD-10-CM

## 2016-10-18 LAB — URINALYSIS, ROUTINE W REFLEX MICROSCOPIC
BILIRUBIN URINE: NEGATIVE
HGB URINE DIPSTICK: NEGATIVE
Nitrite: NEGATIVE
RBC / HPF: NONE SEEN (ref 0–?)
Specific Gravity, Urine: 1.025 (ref 1.000–1.030)
URINE GLUCOSE: NEGATIVE
UROBILINOGEN UA: 0.2 (ref 0.0–1.0)
pH: 6 (ref 5.0–8.0)

## 2016-10-18 MED ORDER — AMLODIPINE BESYLATE 10 MG PO TABS: 10.0000 mg | ORAL_TABLET | Freq: Every day | ORAL | 1 refills | Status: DC

## 2016-10-18 NOTE — Patient Instructions (Signed)
Start losartan 50mg  per day.  (if you have 100mg  tablets - take 1/2 tablet per day).

## 2016-10-18 NOTE — Progress Notes (Signed)
Patient ID: ANU STAGNER, female   DOB: 1928-11-25, 83 y.o.   MRN: [id number]   Subjective:    Patient ID: [name], female    DOB: 1929-01-12, 83 y.o.   MRN: [id number]  HPI  Patient here for her physical exam.  She is accompanied by her husband.  History obtained from both of them.  She is currently doing well regarding her bowels.  No abdominal pain. Eating.  No nausea or vomiting.  Takes two stool softeners and miralax daily.  Also on protonix bid.  No acid reflux.  No chest pain.  No sob.  Does report increased nocturia.  May go up to 6-8 times per day.  She does not feel she is emptying her bladder at times.  Affecting her sleep.  Brings in blood pressure readings.  Blood pressures averaging:  130-150s/70-80s.  Discussed labs.     Past Medical History:  Diagnosis Date  . Barrett's esophagus   . Bowel obstruction (HCC)    s/p adhesion resection  . Chronic back pain   . Colon cancer ([place]) 1988 and 1989   adenocarcinoma, s/p resection x  2 and chemo  . Diverticulitis   . H/O open leg wound   . Hiatal hernia   . Hypercholesteremia   . Hypertension   . Lumbar scoliosis   . Peripheral neuropathy   . Recurrent sinus infections   . Renal cyst   . S/P chemotherapy, time since greater than 12 weeks    colon cancer  . Vertigo    Past Surgical History:  Procedure Laterality Date  . ABDOMINAL HYSTERECTOMY  1982  . adhesions resected     bowel obstruction  . APPENDECTOMY    . BREAST BIOPSY Left    negative 06/14/1985  . CHOLECYSTECTOMY    . COLON RESECTION     x2.  s/p colon cancer  . EXCISIONAL HEMORRHOIDECTOMY     with tubal ligation   Family History  Problem Relation Age of Onset  . Breast cancer Mother   . Asthma Mother   . Stroke Father   . Hypertension Father   . Diabetes Father   . Ovarian cancer Sister        x2  . Prostate cancer Brother   . Hypertension Brother        x3  . Hypercholesterolemia Brother        x3  . Diabetes Brother    Social History     Social History  . Marital status: Married    Spouse name: N/A  . Number of children: 4  . Years of education: 12th grade   Occupational History  . homemaker    Social History Main Topics  . Smoking status: Never Smoker  . Smokeless tobacco: Never Used  . Alcohol use No  . Drug use: No  . Sexual activity: No   Other Topics Concern  . None   Social History Narrative  . None    Outpatient Encounter Prescriptions as of 10/18/2016  Medication Sig  . amLODipine (NORVASC) 10 MG tablet Take 1 tablet (10 mg total) by mouth daily.  . Calcium Carbonate-Vitamin D (CALCIUM 600 + D PO) Take 600 Units by mouth 2 (two) times daily with a meal.   . fexofenadine (ALLEGRA) 60 MG tablet Take 1 tablet (60 mg total) by mouth daily.  . fish oil-omega-3 fatty acids 1000 MG capsule Take 2 g by mouth daily.  . meclizine (ANTIVERT) 25 MG tablet Take 25 mg  by mouth 3 (three) times daily as needed.  . meloxicam (MOBIC) 7.5 MG tablet Take 7.5 mg by mouth daily.  . metoprolol succinate (TOPROL-XL) 25 MG 24 hr tablet Take 1 tablet (25 mg total) by mouth daily.  . ondansetron (ZOFRAN) 4 MG tablet Take 1 tablet (4 mg total) by mouth 2 (two) times daily as needed for nausea or vomiting.  Marland Kitchen oxyCODONE (OXY IR/ROXICODONE) 5 MG immediate release tablet Take 5 mg by mouth 3 (three) times daily.   . pantoprazole (PROTONIX) 40 MG tablet Take 40 mg by mouth 2 (two) times daily.  . simvastatin (ZOCOR) 10 MG tablet Take 1 tablet (10 mg total) by mouth at bedtime.  . traMADol-acetaminophen (ULTRACET) 37.5-325 MG per tablet Take 1 tablet by mouth 2 (two) times daily as needed.   . [DISCONTINUED] amLODipine (NORVASC) 10 MG tablet Take 1 tablet (10 mg total) by mouth daily.   No facility-administered encounter medications on file as of 10/18/2016.     Review of Systems  Constitutional: Negative for appetite change and unexpected weight change.  HENT: Negative for congestion and sinus pressure.   Eyes: Negative for pain  and visual disturbance.  Respiratory: Negative for cough, chest tightness and shortness of breath.   Cardiovascular: Negative for chest pain, palpitations and leg swelling.  Gastrointestinal: Negative for abdominal pain, diarrhea, nausea and vomiting.  Genitourinary: Negative for difficulty urinating and dysuria.       Nocturia.    Musculoskeletal: Negative for joint swelling and myalgias.  Skin: Negative for color change and rash.  Neurological: Negative for dizziness and headaches.  Hematological: Negative for adenopathy. Does not bruise/bleed easily.  Psychiatric/Behavioral: Negative for agitation and dysphoric mood.       Objective:    Physical Exam  Constitutional: She is oriented to person, place, and time. She appears well-developed and well-nourished. No distress.  HENT:  Nose: Nose normal.  Mouth/Throat: Oropharynx is clear and moist.  Eyes: Right eye exhibits no discharge. Left eye exhibits no discharge. No scleral icterus.  Neck: Neck supple. No thyromegaly present.  Cardiovascular: Normal rate and regular rhythm.   Pulmonary/Chest: Breath sounds normal. No accessory muscle usage. No tachypnea. No respiratory distress. She has no decreased breath sounds. She has no wheezes. She has no rhonchi. Right breast exhibits no inverted nipple, no mass, no nipple discharge and no tenderness (no axillary adenopathy). Left breast exhibits no inverted nipple, no mass, no nipple discharge and no tenderness (no axilarry adenopathy).  Abdominal: Soft. Bowel sounds are normal. There is no tenderness.  Musculoskeletal: She exhibits no edema or tenderness.  Lymphadenopathy:    She has no cervical adenopathy.  Neurological: She is alert and oriented to person, place, and time.  Skin: Skin is warm. No rash noted. No erythema.  Psychiatric: She has a normal mood and affect. Her behavior is normal.    BP 136/70 (BP Location: Left Arm, Patient Position: Sitting, Cuff Size: Normal)   Pulse 66    Temp 98.5 F (36.9 C) (Oral)   Resp 16   Ht 5' 1.02" (1.55 m)   Wt 138 lb 3.2 oz (62.7 kg)   SpO2 94%   BMI 26.09 kg/m  Wt Readings from Last 3 Encounters:  10/18/16 138 lb 3.2 oz (62.7 kg)  08/15/16 137 lb 6.4 oz (62.3 kg)  08/11/16 137 lb (62.1 kg)     Lab Results  Component Value Date   WBC 7.0 10/13/2016   HGB 13.8 10/13/2016   HCT 40.8 10/13/2016  PLT 320.0 10/13/2016   GLUCOSE 92 10/13/2016   CHOL 165 10/13/2016   TRIG 109.0 10/13/2016   HDL 86.40 10/13/2016   LDLCALC 57 10/13/2016   ALT 14 10/13/2016   AST 18 10/13/2016   NA 140 10/13/2016   K 3.9 10/13/2016   CL 103 10/13/2016   CREATININE 0.80 10/13/2016   BUN 23 10/13/2016   CO2 30 10/13/2016   TSH 1.84 10/13/2016   INR 0.91 06/11/2015   HGBA1C 5.3 04/12/2016    Ct Abdomen Pelvis W Contrast  Result Date: 08/11/2016 CLINICAL DATA:  83 year old female with mid abdominal pain and tightness onset this morning. Multiple prior abdominal surgeries. History of prior bowel obstructions. EXAM: CT ABDOMEN AND PELVIS WITH CONTRAST TECHNIQUE: Multidetector CT imaging of the abdomen and pelvis was performed using the standard protocol following bolus administration of intravenous contrast. CONTRAST:  169mL ISOVUE-300 IOPAMIDOL (ISOVUE-300) INJECTION 61% COMPARISON:  CT Abdomen and Pelvis 04/13/2016 and earlier. FINDINGS: Lower chest: Mild respiratory motion at the lung bases. Right middle lobe and lateral basal segment lower lobe scarring is stable. No pericardial or pleural effusion. Chronic hiatal hernia with approximately 50% intrathoracic stomach appears stable. Hepatobiliary: Stable status post cholecystectomy with chronic intra- and extrahepatic biliary ductal enlargement. Pancreas: Stable with mild pancreatic duct enlargement. Spleen: Stable with multiple small calcified granulomas. Adrenals/Urinary Tract: Negative adrenal glands. Both kidneys are stable with a chronic left upper pole renal cyst. Renal enhancement and  contrast excretion remains symmetric and within normal limits. Diminutive and unremarkable urinary bladder. Stomach/Bowel: Rectosigmoid colon appear stable since February, with a moderate volume of retained stool but otherwise negative. Redundant sigmoid again noted. Similar retained stool in the left colon and transverse colon. Prior partial right colectomy with small to large bowel anastomosis in the right abdomen again noted. Liquid stool in the residual right colon. There are no abnormally dilated small bowel loops today, but there is mild mesenteric stranding about loops in the ventral pelvis (series 2 images 51 and 52). There are fluid-filled but nondilated loops throughout the pelvis and right lower quadrant. The loops are slightly smaller than on the February study. Negative duodenum and proximal small bowel. Negative intraabdominal stomach. No abdominal free fluid or free air. Vascular/Lymphatic: Aortoiliac calcified atherosclerosis. Major arterial structures in the abdomen and pelvis remain patent. Portal venous system is patent. No lymphadenopathy. Reproductive: Surgically absent. Other: Trace pelvic free fluid along the right lateral aspect of the rectum is new since February. No ventral abdominal hernia identified. Musculoskeletal: Moderate to severe levoconvex thoracolumbar scoliosis and diffuse lumbar disc degeneration. No acute osseous abnormality identified. IMPRESSION: 1. No recurrent small bowel obstruction at this time, but there is mild nonspecific inflammation in the distal small bowel mesentery (series 2, image 52). Top differential considerations for this patient include an infectious enteritis versus a recent obstruction which has spontaneously regressed. 2. Trace pelvic free fluid, likely reactive to #1. No abdominal free air or other free fluid. 3. Otherwise stable postoperative appearance of the abdomen and pelvis. 4.  Calcified aortic atherosclerosis. Electronically Signed   By: Genevie Ann  M.D.   On: 08/11/2016 09:35       Assessment & Plan:   Problem List Items Addressed This Visit    Anemia, iron deficiency    Follow cbc.       Barrett's esophagus    On protonix.  Doing well.  Followed by GI.       Chronic back pain    Followed by Dr Sharlet Salina.  Stable.        Health care maintenance    Physical today 10/18/16.  Mammogram 11/03/15 - Birads I.  Schedule f/u mammogram.  Colonoscopy 07/20/10.  Followed by GI.  Was told did not need another colnoscopy.        Hemochromatosis    Followed by hematology.       Hypercholesteremia    On simvastatin.  Low cholesterol diet and exercise.  Follow lipid panel and liver function tests.        Relevant Medications   amLODipine (NORVASC) 10 MG tablet   Hypertension    Blood pressure on recheck elevated.  Checks outside of here as outlined.  Add losartan 50mg  q day.  Follow.  She has 100mg  tablets at home.  Will take 1/2 tablet per day.        Relevant Medications   amLODipine (NORVASC) 10 MG tablet   Nocturia    Persistent increased nocturia as outlined.  She occasionally feels she is not fully emptying her bladder.  Check urinalysis.  Will refer to urology for further evaluation given the increase episodes and the feeling of not fully emptying her bladder.        Relevant Orders   Urinalysis, Routine w reflex microscopic (Completed)   Urine Culture   Ambulatory referral to Urology    Other Visit Diagnoses    Screening for breast cancer       Relevant Orders   MM DIGITAL SCREENING BILATERAL   Encounter for immunization       Relevant Orders   Flu vaccine HIGH DOSE PF (Completed)       [name], MD

## 2016-10-19 ENCOUNTER — Encounter: Payer: Self-pay | Admitting: Internal Medicine

## 2016-10-19 LAB — URINE CULTURE

## 2016-10-19 NOTE — Assessment & Plan Note (Signed)
Blood pressure on recheck elevated.  Checks outside of here as outlined.  Add losartan 50mg  q day.  Follow.  She has 100mg  tablets at home.  Will take 1/2 tablet per day.

## 2016-10-19 NOTE — Assessment & Plan Note (Signed)
On protonix.  Doing well.  Followed by GI.

## 2016-10-19 NOTE — Assessment & Plan Note (Signed)
Follow cbc.  

## 2016-10-19 NOTE — Assessment & Plan Note (Signed)
Physical today 10/18/16.  Mammogram 11/03/15 - Birads I.  Schedule f/u mammogram.  Colonoscopy 07/20/10.  Followed by GI.  Was told did not need another colnoscopy.

## 2016-10-19 NOTE — Assessment & Plan Note (Signed)
On simvastatin.  Low cholesterol diet and exercise.  Follow lipid panel and liver function tests.   

## 2016-10-19 NOTE — Assessment & Plan Note (Signed)
Followed by hematology 

## 2016-10-19 NOTE — Assessment & Plan Note (Signed)
Followed by Dr Chasnis.  Stable.   

## 2016-10-19 NOTE — Assessment & Plan Note (Signed)
Persistent increased nocturia as outlined.  She occasionally feels she is not fully emptying her bladder.  Check urinalysis.  Will refer to urology for further evaluation given the increase episodes and the feeling of not fully emptying her bladder.

## 2016-11-01 ENCOUNTER — Ambulatory Visit: Payer: Self-pay | Admitting: Urology

## 2016-11-08 ENCOUNTER — Ambulatory Visit
Admission: RE | Admit: 2016-11-08 | Discharge: 2016-11-08 | Disposition: A | Discharge status: Home / Self Care | Payer: Medicare HMO | Source: Ambulatory Visit | Attending: Internal Medicine | Admitting: Internal Medicine

## 2016-11-08 DIAGNOSIS — Z1231 Encounter for screening mammogram for malignant neoplasm of breast: Secondary | ICD-10-CM | POA: Diagnosis not present

## 2016-11-08 DIAGNOSIS — Z1239 Encounter for other screening for malignant neoplasm of breast: Secondary | ICD-10-CM

## 2016-12-02 ENCOUNTER — Telehealth: Payer: Self-pay | Admitting: Radiology

## 2016-12-02 ENCOUNTER — Other Ambulatory Visit: Payer: Medicare HMO

## 2016-12-02 NOTE — Telephone Encounter (Signed)
Notified pt that no labs were needed. Pt verbalized understanding and had no further questions or concerns.

## 2016-12-02 NOTE — Telephone Encounter (Signed)
I do not see where she needs liver panel checked.

## 2016-12-02 NOTE — Telephone Encounter (Addendum)
Lab appt today was cancelled due to unsuccessful stick for blood work. Due to pt already having labs on 8/30, does pt still need blood work for liver function test? Just want to be sure when notifying pt. Thank you

## 2016-12-02 NOTE — Telephone Encounter (Signed)
Per review of chart and past appts, she had cholesterol checked 10/13/16.  Too early to do now.  Also, it appears the lab appt for 12/02/16 was cancelled.  See appt screening.  Please notify pt.

## 2016-12-02 NOTE — Telephone Encounter (Signed)
Pt came in for blood work today and stated she was to get her cholesterol checked. No orders placed but saw in [facility] notes to have follow up lipid and liver function tests. Please place future orders. Thank you.

## 2016-12-05 ENCOUNTER — Ambulatory Visit: Payer: Medicare HMO | Admitting: Internal Medicine

## 2016-12-16 DIAGNOSIS — M7062 Trochanteric bursitis, left hip: Secondary | ICD-10-CM | POA: Diagnosis not present

## 2016-12-16 DIAGNOSIS — M5416 Radiculopathy, lumbar region: Secondary | ICD-10-CM | POA: Diagnosis not present

## 2016-12-16 DIAGNOSIS — M4726 Other spondylosis with radiculopathy, lumbar region: Secondary | ICD-10-CM | POA: Diagnosis not present

## 2016-12-16 DIAGNOSIS — M5136 Other intervertebral disc degeneration, lumbar region: Secondary | ICD-10-CM | POA: Diagnosis not present

## 2016-12-22 ENCOUNTER — Ambulatory Visit: Payer: Medicare HMO | Admitting: Internal Medicine

## 2016-12-22 ENCOUNTER — Encounter: Payer: Self-pay | Admitting: Internal Medicine

## 2016-12-22 DIAGNOSIS — M545 Low back pain: Secondary | ICD-10-CM

## 2016-12-22 DIAGNOSIS — R600 Localized edema: Secondary | ICD-10-CM | POA: Diagnosis not present

## 2016-12-22 DIAGNOSIS — E7800 Pure hypercholesterolemia, unspecified: Secondary | ICD-10-CM | POA: Diagnosis not present

## 2016-12-22 DIAGNOSIS — G8929 Other chronic pain: Secondary | ICD-10-CM

## 2016-12-22 DIAGNOSIS — R42 Dizziness and giddiness: Secondary | ICD-10-CM | POA: Diagnosis not present

## 2016-12-22 DIAGNOSIS — I10 Essential (primary) hypertension: Secondary | ICD-10-CM

## 2016-12-22 MED ORDER — IRBESARTAN 75 MG PO TABS: 75.0000 mg | ORAL_TABLET | Freq: Every day | ORAL | 1 refills | Status: DC

## 2016-12-22 NOTE — Progress Notes (Signed)
Patient ID: [name], female   DOB: 09-12-1928, 83 y.o.   MRN: [id number]   Subjective:    Patient ID: [name], female    DOB: [birth year], 83 y.o.   MRN: [id number]  HPI  Patient here for a scheduled follow up.  She is accompanied by her husband.  History obtained from both of them.  She reports she was having problems with dizziness.  Related to losartan.  States she stopped taking the medication about 10 days ago and the dizziness stopped.  No chest pain.  No sob.  No acid reflux.  No abdominal pain.  Bowels moving.  Eating.  Has been having problems with left hip and leg.  Planning for epidural 01/2017 and 02/2017.  Seeing Dr Sharlet Salina.  Reviewed blood pressure checks:  Blood pressures averaging 130-150s/70-80s.     Past Medical History:  Diagnosis Date  . Barrett's esophagus   . Bowel obstruction (HCC)    s/p adhesion resection  . Chronic back pain   . Colon cancer ([place]) 1988 and 1989   adenocarcinoma, s/p resection x  2 and chemo  . Diverticulitis   . H/O open leg wound   . Hiatal hernia   . Hypercholesteremia   . Hypertension   . Lumbar scoliosis   . Peripheral neuropathy   . Recurrent sinus infections   . Renal cyst   . S/P chemotherapy, time since greater than 12 weeks    colon cancer  . Vertigo    Past Surgical History:  Procedure Laterality Date  . ABDOMINAL HYSTERECTOMY  1982  . adhesions resected     bowel obstruction  . APPENDECTOMY    . BREAST BIOPSY Left    negative 06/14/1985  . CHOLECYSTECTOMY    . COLON RESECTION     x2.  s/p colon cancer  . EXCISIONAL HEMORRHOIDECTOMY     with tubal ligation   Family History  Problem Relation Age of Onset  . Breast cancer Mother   . Asthma Mother   . Stroke Father   . Hypertension Father   . Diabetes Father   . Ovarian cancer Sister        x2  . Prostate cancer Brother   . Hypertension Brother        x3  . Hypercholesterolemia Brother        x3  . Diabetes Brother    Social History    Socioeconomic History  . Marital status: Married    Spouse name: None  . Number of children: 4  . Years of education: 12th grade  . Highest education level: None  Social Needs  . Financial resource strain: None  . Food insecurity - worry: None  . Food insecurity - inability: None  . Transportation needs - medical: None  . Transportation needs - non-medical: None  Occupational History  . Occupation: homemaker  Tobacco Use  . Smoking status: Never Smoker  . Smokeless tobacco: Never Used  Substance and Sexual Activity  . Alcohol use: No    Alcohol/week: 0.0 oz  . Drug use: No  . Sexual activity: No  Other Topics Concern  . None  Social History Narrative  . None    Outpatient Encounter Medications as of 12/22/2016  Medication Sig  . amLODipine (NORVASC) 10 MG tablet Take 1 tablet (10 mg total) by mouth daily.  . Calcium Carbonate-Vitamin D (CALCIUM 600 + D PO) Take 600 Units by mouth 2 (two) times daily with a meal.   .  fexofenadine (ALLEGRA) 60 MG tablet Take 1 tablet (60 mg total) by mouth daily.  . fish oil-omega-3 fatty acids 1000 MG capsule Take 2 g by mouth daily.  . meclizine (ANTIVERT) 25 MG tablet Take 25 mg by mouth 3 (three) times daily as needed.  . meloxicam (MOBIC) 7.5 MG tablet Take 7.5 mg by mouth daily.  . metoprolol succinate (TOPROL-XL) 25 MG 24 hr tablet Take 1 tablet (25 mg total) by mouth daily.  . ondansetron (ZOFRAN) 4 MG tablet Take 1 tablet (4 mg total) by mouth 2 (two) times daily as needed for nausea or vomiting.  Marland Kitchen oxyCODONE (OXY IR/ROXICODONE) 5 MG immediate release tablet Take 5 mg by mouth 3 (three) times daily.   . pantoprazole (PROTONIX) 40 MG tablet Take 40 mg by mouth 2 (two) times daily.  . simvastatin (ZOCOR) 10 MG tablet Take 1 tablet (10 mg total) by mouth at bedtime.  . traMADol-acetaminophen (ULTRACET) 37.5-325 MG per tablet Take 1 tablet by mouth 2 (two) times daily as needed.   . irbesartan (AVAPRO) 75 MG tablet Take 1 tablet (75 mg  total) daily by mouth.   No facility-administered encounter medications on file as of 12/22/2016.     Review of Systems  Constitutional: Negative for appetite change and unexpected weight change.  HENT: Negative for congestion and sinus pressure.   Respiratory: Negative for cough, chest tightness and shortness of breath.   Cardiovascular: Negative for chest pain, palpitations and leg swelling.  Gastrointestinal: Negative for abdominal pain, diarrhea, nausea and vomiting.  Genitourinary: Negative for difficulty urinating and dysuria.  Musculoskeletal: Positive for back pain. Negative for joint swelling.  Skin: Negative for color change and rash.  Neurological: Negative for headaches.       No dizziness now.    Psychiatric/Behavioral: Negative for agitation and dysphoric mood.       Objective:    Physical Exam  Constitutional: She appears well-developed and well-nourished. No distress.  HENT:  Nose: Nose normal.  Mouth/Throat: Oropharynx is clear and moist.  Neck: Neck supple. No thyromegaly present.  Cardiovascular: Normal rate and regular rhythm.  Pulmonary/Chest: Breath sounds normal. No respiratory distress. She has no wheezes.  Abdominal: Soft. Bowel sounds are normal. There is no tenderness.  Musculoskeletal: She exhibits no edema or tenderness.  Lymphadenopathy:    She has no cervical adenopathy.  Skin: No rash noted. No erythema.  Psychiatric: She has a normal mood and affect. Her behavior is normal.    BP (!) 158/62 (BP Location: Left Arm, Patient Position: Sitting, Cuff Size: Normal)   Pulse 63   Temp (!) 97.3 F (36.3 C) (Oral)   Resp 15   Wt 143 lb 9.6 oz (65.1 kg)   SpO2 97%   BMI 27.11 kg/m  Wt Readings from Last 3 Encounters:  12/22/16 143 lb 9.6 oz (65.1 kg)  10/18/16 138 lb 3.2 oz (62.7 kg)  08/15/16 137 lb 6.4 oz (62.3 kg)     Lab Results  Component Value Date   WBC 7.0 10/13/2016   HGB 13.8 10/13/2016   HCT 40.8 10/13/2016   PLT 320.0  10/13/2016   GLUCOSE 92 10/13/2016   CHOL 165 10/13/2016   TRIG 109.0 10/13/2016   HDL 86.40 10/13/2016   LDLCALC 57 10/13/2016   ALT 14 10/13/2016   AST 18 10/13/2016   NA 140 10/13/2016   K 3.9 10/13/2016   CL 103 10/13/2016   CREATININE 0.80 10/13/2016   BUN 23 10/13/2016   CO2 30 10/13/2016  TSH 1.84 10/13/2016   INR 0.91 06/11/2015   HGBA1C 5.3 04/12/2016    Mm Digital Screening Bilateral  Result Date: 11/08/2016 CLINICAL DATA:  Screening. EXAM: DIGITAL SCREENING BILATERAL MAMMOGRAM WITH CAD COMPARISON:  Previous exam(s). ACR Breast Density Category b: There are scattered areas of fibroglandular density. FINDINGS: There are no findings suspicious for malignancy. Images were processed with CAD. IMPRESSION: No mammographic evidence of malignancy. A result letter of this screening mammogram will be mailed directly to the patient. RECOMMENDATION: Screening mammogram in one year. (Code:SM-B-01Y) BI-RADS CATEGORY  1: Negative. Electronically Signed   By: Lajean Manes M.D.   On: 11/08/2016 13:32       Assessment & Plan:   Problem List Items Addressed This Visit    Bilateral lower extremity edema    Improved.  Wearing compression hose.        Chronic back pain    Followed by Dr Sharlet Salina.        Dizziness    Dizziness resolved with stopping losartan.  Add avapro for better blood pressure control.  Follow pressures.        Hemochromatosis    Followed by hematology.        Hypercholesteremia    Low cholesterol diet and exercise.  Follow lipid panel.        Relevant Medications   irbesartan (AVAPRO) 75 MG tablet   Other Relevant Orders   Hepatic function panel   Lipid panel   Hypertension    Blood pressure remaining above goal.  She had dizziness with losartan.  Add avapro.  Follow pressures.  Get her back in soon to reassess.        Relevant Medications   irbesartan (AVAPRO) 75 MG tablet   Other Relevant Orders   Basic metabolic panel       [name],  MD

## 2016-12-25 ENCOUNTER — Encounter: Payer: Self-pay | Admitting: Internal Medicine

## 2016-12-25 NOTE — Assessment & Plan Note (Signed)
Dizziness resolved with stopping losartan.  Add avapro for better blood pressure control.  Follow pressures.

## 2016-12-25 NOTE — Assessment & Plan Note (Addendum)
Improved.  Wearing compression hose.  

## 2016-12-25 NOTE — Assessment & Plan Note (Signed)
Low cholesterol diet and exercise.  Follow lipid panel.   

## 2016-12-25 NOTE — Assessment & Plan Note (Signed)
Followed by hematology 

## 2016-12-25 NOTE — Assessment & Plan Note (Signed)
Followed by Dr Chasnis.   

## 2016-12-25 NOTE — Assessment & Plan Note (Signed)
Blood pressure remaining above goal.  She had dizziness with losartan.  Add avapro.  Follow pressures.  Get her back in soon to reassess.

## 2016-12-26 NOTE — Progress Notes (Signed)
37   12/27/2016 8:15 PM   [name] [name] [birth year] [id number]  Referring provider: [name], [address] [place], [place] [address]  Chief Complaint  Patient presents with  . Nocturia    HPI: Patient is a 83 -year-old Caucasian female who is referred to [place] by, Dr.Scott, for nocturia with her husband, Joneen Caraway.    Patient states that she has had nocturia for several months.  She is getting up sometimes up to 10 times a night.    She is having associated urinary frequency, urgency, incontinence and intermittency.  Her PVR is 37 mL.  She does not have a history of urinary tract infections, STI's or injury to the bladder.   She denies dysuria, gross hematuria, suprapubic pain, back pain, abdominal pain or flank pain.  She has not had any recent fevers, chills, nausea or vomiting.   She does not have a history of nephrolithiasis, GU surgery or GU trauma.  She is post menopausal.   She denies constipation and/or diarrhea.   She is not having pain with bladder filling.    Contrast CT performed in 07/2016 noted negative adrenal glands.  Both kidneys are stable with a chronic left upper pole renal cyst.  Renal enhancement and contrast excretion remains symmetric and within normal limits. Diminutive and unremarkable urinary bladder.  She is drinking three to four glasses of water daily.   She is drinking 2 cups of coffee during the day, but she does not drink coffee after 1 pm.  She stops all liquid intake at 8 pm, but she does take her night time meds with a glass of water.    She has noted lower leg edema.  Her husband and two children have been diagnosed with sleep apnea.      Reviewed referral notes.                 - Does report increased nocturia.  May go up to 6-8 times per day.  She does not feel she is emptying her bladder at times.  Affecting her sleep.    PMH: Past Medical History:  Diagnosis Date  . Barrett's esophagus   . Bowel obstruction  (HCC)    s/p adhesion resection  . Chronic back pain   . Colon cancer ([place]) 1988 and 1989   adenocarcinoma, s/p resection x  2 and chemo  . Diverticulitis   . GERD (gastroesophageal reflux disease)   . H/O open leg wound   . Hiatal hernia   . Hypercholesteremia   . Hypertension   . Lumbar scoliosis   . OA (osteoarthritis)   . Peripheral neuropathy   . Recurrent sinus infections   . Renal cyst   . S/P chemotherapy, time since greater than 12 weeks    colon cancer  . Vertigo     Surgical History: Past Surgical History:  Procedure Laterality Date  . ABDOMINAL HYSTERECTOMY  1982  . adhesions resected     bowel obstruction  . APPENDECTOMY    . BREAST BIOPSY Left    negative 06/14/1985  . CHOLECYSTECTOMY  1994  . COLON RESECTION     x2.  s/p colon cancer  . [place]  . EXCISIONAL HEMORRHOIDECTOMY     with tubal ligation  . EXPLORATORY LAPAROTOMY  1991   Secondary to SBO    Home Medications:  Allergies as of 12/27/2016   No Known Allergies  Medication List        Accurate as of 12/27/16 11:59 PM. Always use your most recent med list.          amLODipine 10 MG tablet Commonly known as:  NORVASC Take 1 tablet (10 mg total) by mouth daily.   CALCIUM 600 + D PO Take 600 Units by mouth 2 (two) times daily with a meal.   fexofenadine 60 MG tablet Commonly known as:  ALLEGRA Take 1 tablet (60 mg total) by mouth daily.   fish oil-omega-3 fatty acids 1000 MG capsule Take 2 g by mouth daily.   irbesartan 75 MG tablet Commonly known as:  AVAPRO Take 1 tablet (75 mg total) daily by mouth.   meclizine 25 MG tablet Commonly known as:  ANTIVERT Take 25 mg by mouth 3 (three) times daily as needed.   meloxicam 7.5 MG tablet Commonly known as:  MOBIC Take 7.5 mg by mouth daily.   metoprolol succinate 25 MG 24 hr tablet Commonly known as:  TOPROL-XL Take 1 tablet (25 mg total) by mouth daily.   ondansetron 4 MG  tablet Commonly known as:  ZOFRAN Take 1 tablet (4 mg total) by mouth 2 (two) times daily as needed for nausea or vomiting.   oxyCODONE 5 MG immediate release tablet Commonly known as:  Oxy IR/ROXICODONE Take 5 mg by mouth 3 (three) times daily.   pantoprazole 40 MG tablet Commonly known as:  PROTONIX Take 40 mg by mouth 2 (two) times daily.   simvastatin 10 MG tablet Commonly known as:  ZOCOR Take 1 tablet (10 mg total) by mouth at bedtime.   traMADol-acetaminophen 37.5-325 MG tablet Commonly known as:  ULTRACET Take 1 tablet by mouth 2 (two) times daily as needed.       Allergies: No Known Allergies  Family History: Family History  Problem Relation Age of Onset  . Breast cancer Mother   . Asthma Mother   . Stroke Father   . Hypertension Father   . Diabetes Father   . Ovarian cancer Sister        x2  . Prostate cancer Brother   . Hypertension Brother        x3  . Hypercholesterolemia Brother        x3  . Diabetes Brother   . Hematuria Neg Hx   . Kidney cancer Neg Hx   . Kidney disease Neg Hx   . Sickle cell trait Neg Hx   . Tuberculosis Neg Hx     Social History:  reports that  has never smoked. she has never used smokeless tobacco. She reports that she does not drink alcohol or use drugs.  ROS: UROLOGY Frequent Urination?: Yes Hard to postpone urination?: Yes Burning/pain with urination?: No Get up at night to urinate?: Yes Leakage of urine?: Yes Urine stream starts and stops?: Yes Trouble starting stream?: No Do you have to strain to urinate?: No Blood in urine?: No Urinary tract infection?: No Sexually transmitted disease?: No Injury to kidneys or bladder?: No Painful intercourse?: No Weak stream?: No Currently pregnant?: No Vaginal bleeding?: No  Gastrointestinal Nausea?: No Vomiting?: No Indigestion/heartburn?: No Diarrhea?: No Constipation?: No  Constitutional Fever: No Night sweats?: No Weight loss?: No Fatigue?: No  Skin Skin  rash/lesions?: No Itching?: No  Eyes Blurred vision?: No Double vision?: No  Ears/Nose/Throat Sore throat?: No Sinus problems?: No  Hematologic/Lymphatic Swollen glands?: No Easy bruising?: Yes  Cardiovascular Leg swelling?: No Chest pain?: No  Respiratory Cough?:  No Shortness of breath?: No  Endocrine Excessive thirst?: No  Musculoskeletal Back pain?: Yes Joint pain?: Yes  Neurological Headaches?: No Dizziness?: No  Psychologic Depression?: No Anxiety?: No  Physical Exam: BP (!) 175/70   Pulse (!) 59   Ht 5\' 1"  (1.549 m)   Wt 140 lb (63.5 kg)   BMI 26.45 kg/m   Constitutional: Well nourished. Alert and oriented, No acute distress. HEENT: [place] AT, moist mucus membranes. Trachea midline, no masses. Cardiovascular: No clubbing, cyanosis, or edema. Respiratory: Normal respiratory effort, no increased work of breathing. GI: Abdomen is soft, non tender, non distended, no abdominal masses. Liver and spleen not palpable.  No hernias appreciated.  Stool sample for occult testing is not indicated.   GU: No CVA tenderness.  No bladder fullness or masses.  Atophic external genitalia, normal pubic hair distribution, no lesions.  Normal urethral meatus, no lesions, no prolapse, no discharge.   No urethral masses, tenderness and/or tenderness. No bladder fullness, tenderness or masses. Pale vagina mucosa, poor estrogen effect, no discharge, no lesions, good pelvic support, no cystocele or rectocele noted.  Cervix, uterus and adnexa are surgically absent.  Anus and perineum are without rashes or lesions.    Skin: No rashes, bruises or suspicious lesions. Lymph: No cervical or inguinal adenopathy. Neurologic: Grossly intact, no focal deficits, moving all 4 extremities. Psychiatric: Normal mood and affect.  Laboratory Data: Lab Results  Component Value Date   WBC 7.0 10/13/2016   HGB 13.8 10/13/2016   HCT 40.8 10/13/2016   MCV 97.7 10/13/2016   PLT 320.0 10/13/2016     Lab Results  Component Value Date   CREATININE 0.80 10/13/2016    Lab Results  Component Value Date   HGBA1C 5.3 04/12/2016    Lab Results  Component Value Date   TSH 1.84 10/13/2016       Component Value Date/Time   CHOL 165 10/13/2016 0951   HDL 86.40 10/13/2016 0951   CHOLHDL 2 10/13/2016 0951   VLDL 21.8 10/13/2016 0951   LDLCALC 57 10/13/2016 0951    Lab Results  Component Value Date   AST 18 10/13/2016   Lab Results  Component Value Date   ALT 14 10/13/2016   I have reviewed the labs.   Pertinent Imaging: CLINICAL DATA:  83 year old female with mid abdominal pain and tightness onset this morning. Multiple prior abdominal surgeries. History of prior bowel obstructions.  EXAM: CT ABDOMEN AND PELVIS WITH CONTRAST  TECHNIQUE: Multidetector CT imaging of the abdomen and pelvis was performed using the standard protocol following bolus administration of intravenous contrast.  CONTRAST:  13mL ISOVUE-300 IOPAMIDOL (ISOVUE-300) INJECTION 61%  COMPARISON:  CT Abdomen and Pelvis 04/13/2016 and earlier.  FINDINGS: Lower chest: Mild respiratory motion at the lung bases. Right middle lobe and lateral basal segment lower lobe scarring is stable. No pericardial or pleural effusion. Chronic hiatal hernia with approximately 50% intrathoracic stomach appears stable.  Hepatobiliary: Stable status post cholecystectomy with chronic intra- and extrahepatic biliary ductal enlargement.  Pancreas: Stable with mild pancreatic duct enlargement.  Spleen: Stable with multiple small calcified granulomas.  Adrenals/Urinary Tract: Negative adrenal glands.  Both kidneys are stable with a chronic left upper pole renal cyst. Renal enhancement and contrast excretion remains symmetric and within normal limits. Diminutive and unremarkable urinary bladder.  Stomach/Bowel: Rectosigmoid colon appear stable since February, with a moderate volume of retained stool  but otherwise negative. Redundant sigmoid again noted. Similar retained stool in the left colon and transverse colon. Prior  partial right colectomy with small to large bowel anastomosis in the right abdomen again noted. Liquid stool in the residual right colon.  There are no abnormally dilated small bowel loops today, but there is mild mesenteric stranding about loops in the ventral pelvis (series 2 images 51 and 52). There are fluid-filled but nondilated loops throughout the pelvis and right lower quadrant. The loops are slightly smaller than on the February study.  Negative duodenum and proximal small bowel. Negative intraabdominal stomach.  No abdominal free fluid or free air.  Vascular/Lymphatic: Aortoiliac calcified atherosclerosis. Major arterial structures in the abdomen and pelvis remain patent. Portal venous system is patent.  No lymphadenopathy.  Reproductive: Surgically absent.  Other: Trace pelvic free fluid along the right lateral aspect of the rectum is new since February.  No ventral abdominal hernia identified.  Musculoskeletal: Moderate to severe levoconvex thoracolumbar scoliosis and diffuse lumbar disc degeneration. No acute osseous abnormality identified.  IMPRESSION: 1. No recurrent small bowel obstruction at this time, but there is mild nonspecific inflammation in the distal small bowel mesentery (series 2, image 52). Top differential considerations for this patient include an infectious enteritis versus a recent obstruction which has spontaneously regressed. 2. Trace pelvic free fluid, likely reactive to #1. No abdominal free air or other free fluid. 3. Otherwise stable postoperative appearance of the abdomen and pelvis. 4.  Calcified aortic atherosclerosis.   Electronically Signed   By: Genevie Ann M.D.   On: 08/11/2016 09:35 I have independently reviewed the films.   Results for AMANTHA, SKLAR (MRN [id number]) as of 01/12/2017 15:00   Ref. Range 01/12/2017 14:59  Scan Result Unknown 37 mL    Assessment & Plan:    1. Nocturia  - I explained to the patient that nocturia is often multi-factorial and difficult to treat.  Sleeping disorders, heart conditions, peripheral vascular disease, diabetes, an enlarged prostate for men, an urethral stricture causing bladder outlet obstruction and/or certain medications can contribute to nocturia.  - I have suggested that the patient avoid caffeine after noon and alcohol in the evening.  He or she may also benefit from fluid restrictions after 6:00 in the evening and voiding just prior to bedtime.  - I have explained that research studies have showed that over 84% of patients with sleep apnea reported frequent nighttime urination.   With sleep apnea, oxygen decreases, carbon dioxide increases, the blood become more acidic, the heart rate drops and blood vessels in the lung constrict.  The body is then alerted that something is very wrong. The sleeper must wake enough to reopen the airway. By this time, the heart is racing and experiences a false signal of fluid overload. The heart excretes a hormone-like protein that tells the body to get rid of sodium and water, resulting in nocturia.  -  I also informed the patient that a recent study noted that decreasing sodium intake to 2.3 grams daily, if they don't have issues with hyponatremia, can also reduce the number of nightly voids  - There is also an increased incidence in sleep apnea with menopause, symptoms include night sweats, daytime sleepiness, depressed mood, and cognitive complaints like poor concentration or problems with short-term memory   - The patient may benefit from a discussion with his or her primary care physician to see if he or she has risk factors for sleep apnea or other sleep disturbances and obtaining a sleep study - she will discuss this with Dr. Nicki Reaper  Return for pending sleep study results.  These notes  generated with voice recognition software. I apologize for typographical errors.  Zara Council, [facility] [address], [place], [place] [address] [phone]

## 2016-12-27 ENCOUNTER — Encounter: Payer: Self-pay | Admitting: Urology

## 2016-12-27 ENCOUNTER — Ambulatory Visit: Payer: Medicare HMO | Admitting: Urology

## 2016-12-27 VITALS — BP 175/70 | HR 59 | Ht 61.0 in | Wt 140.0 lb

## 2016-12-27 DIAGNOSIS — R351 Nocturia: Secondary | ICD-10-CM

## 2017-01-12 LAB — BLADDER SCAN AMB NON-IMAGING: Scan Result: 37

## 2017-01-16 ENCOUNTER — Other Ambulatory Visit: Payer: Self-pay | Admitting: Internal Medicine

## 2017-01-16 NOTE — Telephone Encounter (Signed)
Copied from [place] [id number]. Topic: Quick Communication - See Telephone Encounter >> Jan 16, 2017  2:52 PM Burnis Medin, NT wrote: CRM for notification. See Telephone encounter for: Pt is calling in for a medication refill for irbesartan (AVAPRO) 75 MG tablet. Pt uses [facility]. Pt would like a call back.  01/16/17.

## 2017-01-17 ENCOUNTER — Other Ambulatory Visit: Payer: Self-pay

## 2017-01-17 MED ORDER — IRBESARTAN 75 MG PO TABS: 75.0000 mg | ORAL_TABLET | Freq: Every day | ORAL | 1 refills | Status: DC

## 2017-01-17 NOTE — Telephone Encounter (Signed)
Avapro refilled as requested.

## 2017-02-02 DIAGNOSIS — M5416 Radiculopathy, lumbar region: Secondary | ICD-10-CM | POA: Diagnosis not present

## 2017-02-02 DIAGNOSIS — M5136 Other intervertebral disc degeneration, lumbar region: Secondary | ICD-10-CM | POA: Diagnosis not present

## 2017-02-02 DIAGNOSIS — M48062 Spinal stenosis, lumbar region with neurogenic claudication: Secondary | ICD-10-CM | POA: Diagnosis not present

## 2017-02-02 IMAGING — US US CAROTID DUPLEX BILAT
1 series · 13 of 24 positions shown · non-contrast
Comparison: None.

CLINICAL DATA: Dizziness for 4 weeks

EXAM:
BILATERAL CAROTID DUPLEX ULTRASOUND
TECHNIQUE: Gray scale imaging, color Doppler and duplex ultrasound were
performed of bilateral carotid and vertebral arteries in the neck.

[Series 1: us carotid duplex bilat · 0.05mm/px · 13 of 63 slices shown]
[im 1/63]
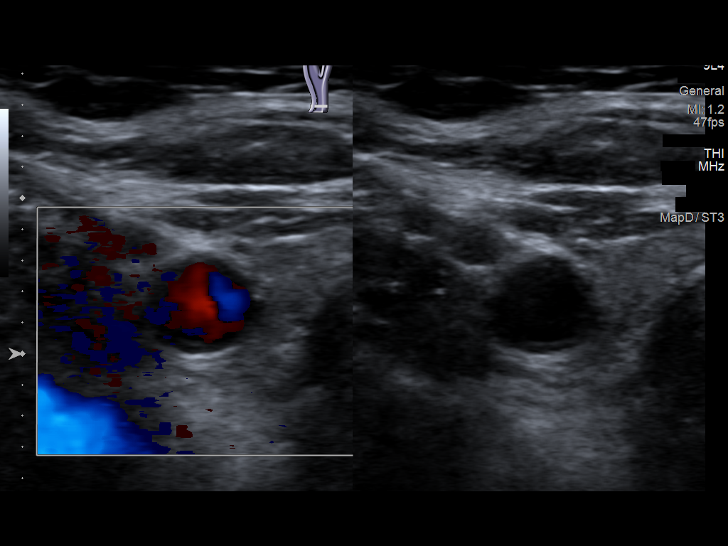
[im 6/63]
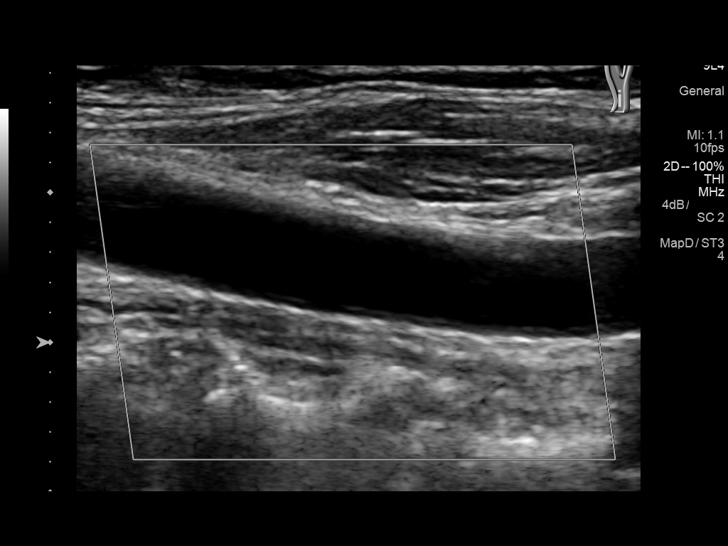
[im 11/63]
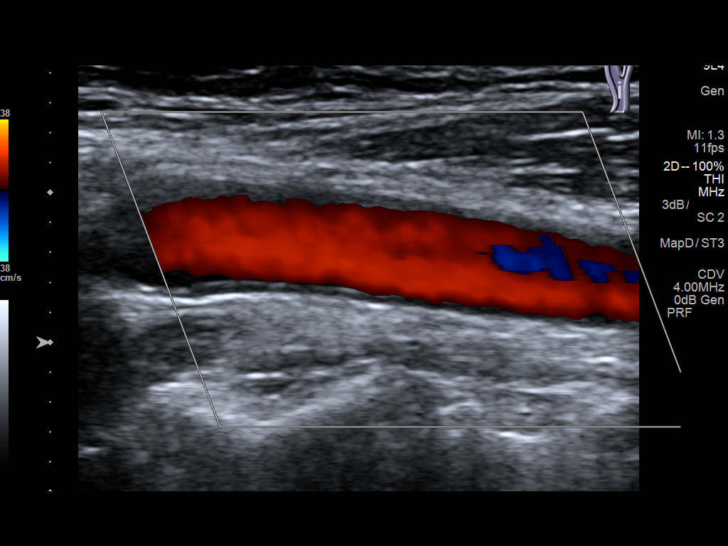
[im 17/63]
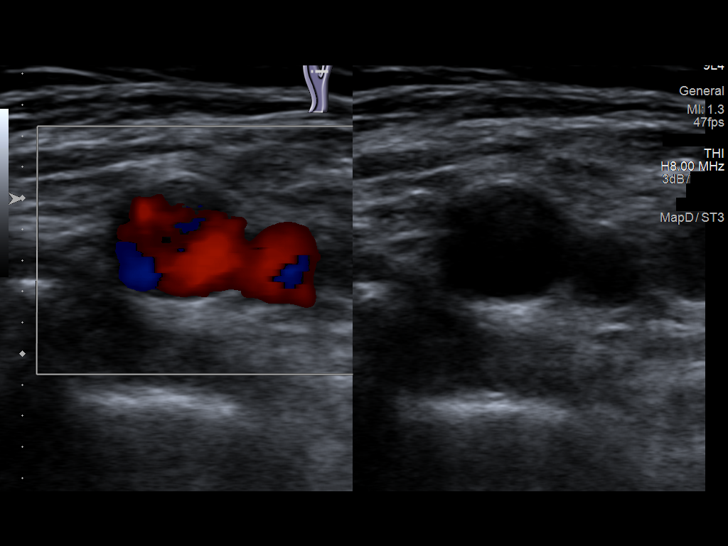
[im 22/63]
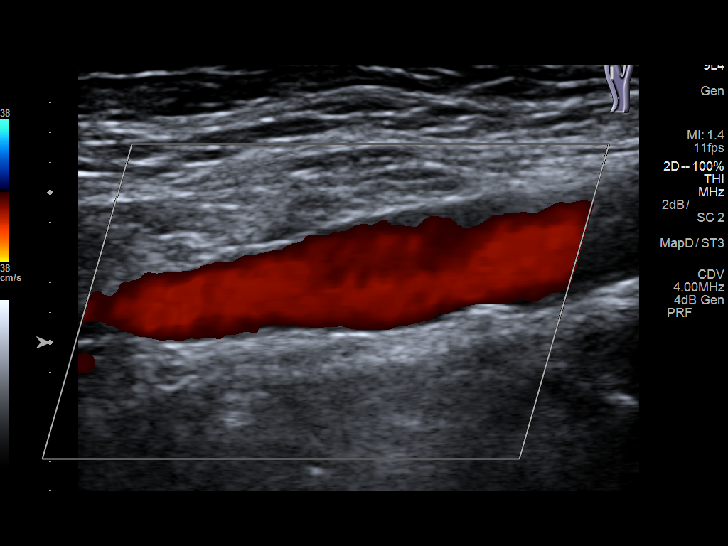
[im 27/63]
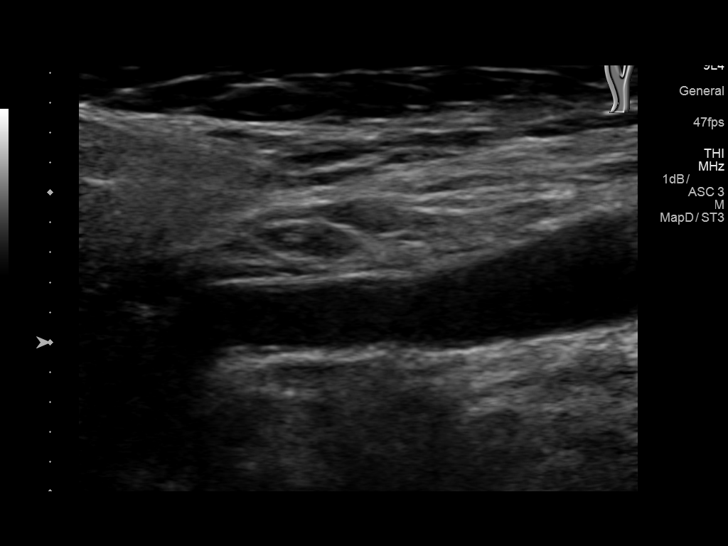
[im 33/63]
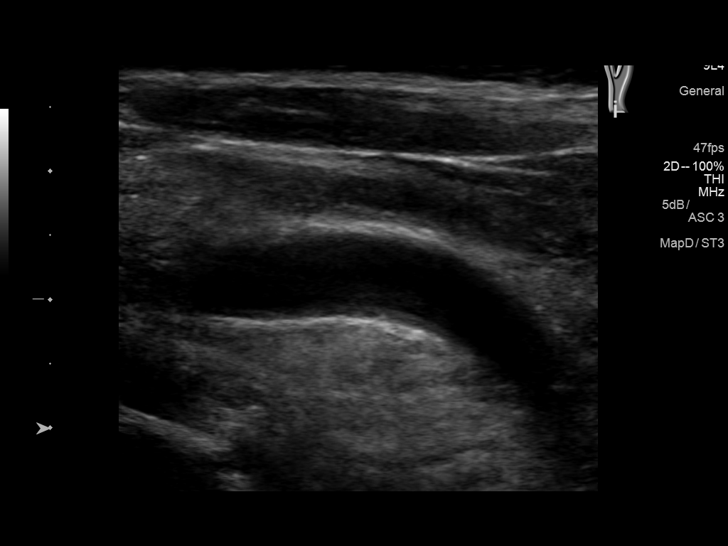
[im 36/63]
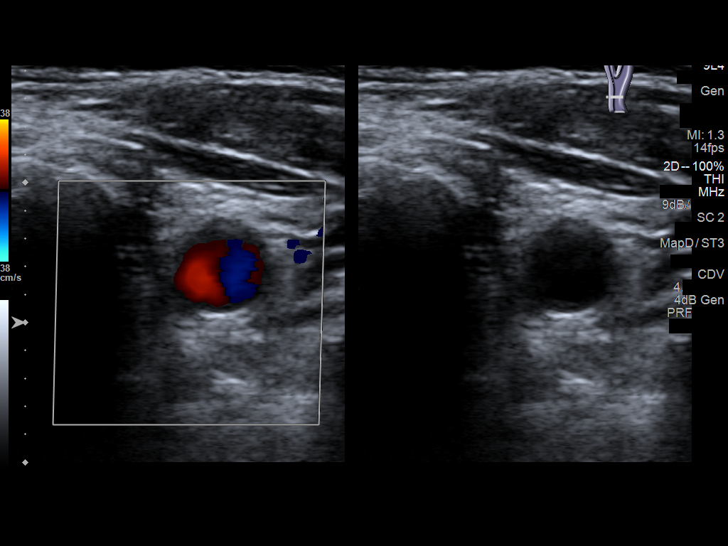
[im 41/63]
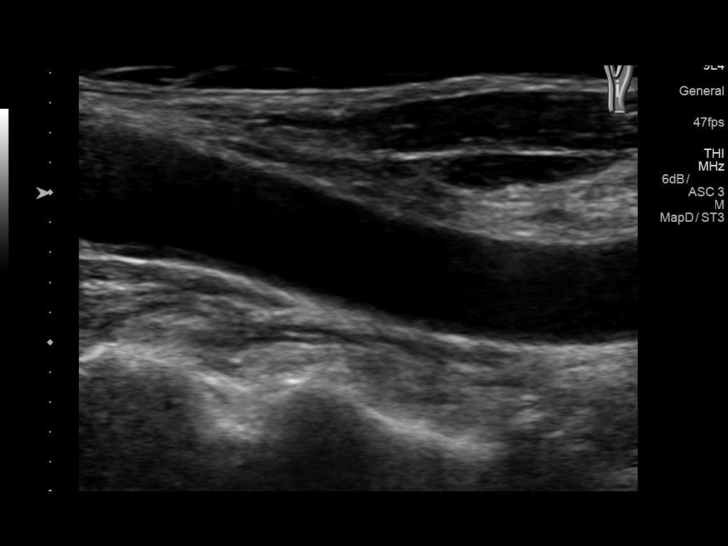
[im 46/63]
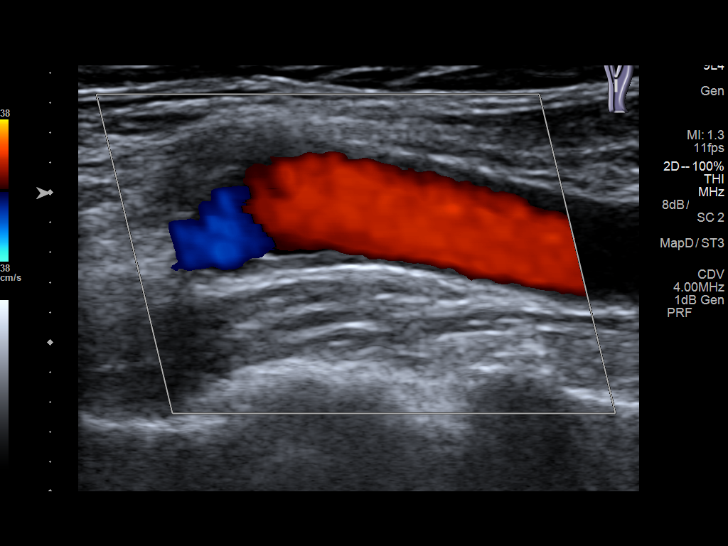
[im 52/63]
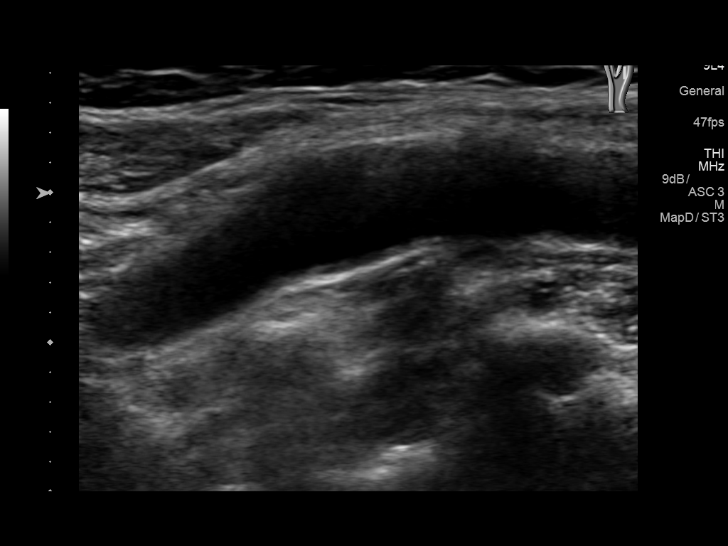
[im 57/63]
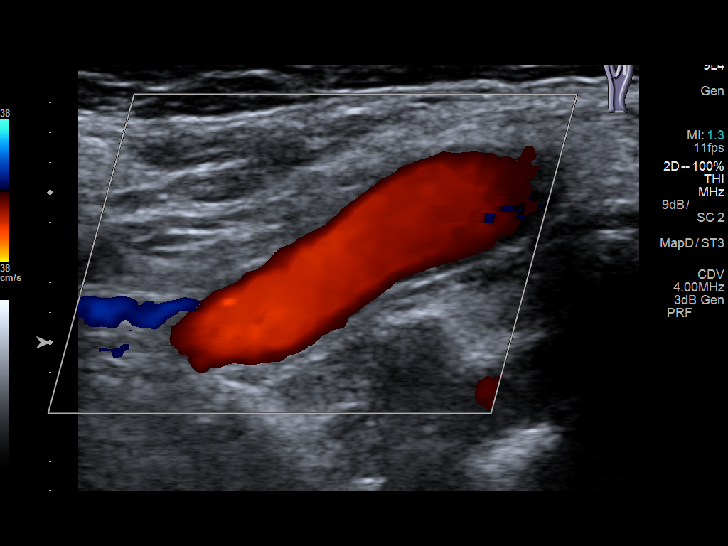
[im 63/63]
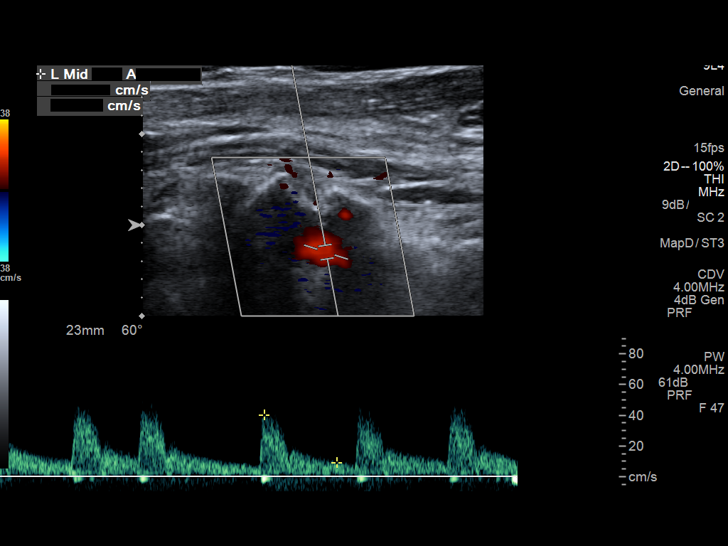

[13 of 24 positions shown; findings below may reference images not displayed]

FINDINGS: Criteria: Quantification of carotid stenosis is based on velocity
parameters that correlate the residual internal carotid diameter
with NASCET-based stenosis levels, using the diameter of the distal
internal carotid lumen as the denominator for stenosis measurement.

The following velocity measurements were obtained:

RIGHT

ICA:  80 cm/sec

CCA:  95 cm/sec

SYSTOLIC ICA/CCA RATIO:

DIASTOLIC ICA/CCA RATIO:

ECA:  74 cm/sec

LEFT

ICA:  80 cm/sec

CCA:  89 cm/sec

SYSTOLIC ICA/CCA RATIO:

DIASTOLIC ICA/CCA RATIO:

ECA:  89 cm/sec

RIGHT CAROTID ARTERY: Little if any plaque in the bulb. Moderate
tortuosity. Low resistance internal carotid Doppler pattern.

RIGHT VERTEBRAL ARTERY:  Antegrade with a normal Doppler pattern.

LEFT CAROTID ARTERY: Mild calcified plaque in the bulb. Low
resistance internal carotid Doppler pattern.

LEFT VERTEBRAL ARTERY: Antegrade with a low resistance Doppler
pattern.
IMPRESSION: Less than 50% stenosis in the right and left internal carotid
arteries.

## 2017-02-11 ENCOUNTER — Other Ambulatory Visit: Payer: Self-pay | Admitting: Internal Medicine

## 2017-02-16 DIAGNOSIS — D229 Melanocytic nevi, unspecified: Secondary | ICD-10-CM | POA: Diagnosis not present

## 2017-02-16 DIAGNOSIS — D1800 Hemangioma unspecified site: Secondary | ICD-10-CM | POA: Diagnosis not present

## 2017-02-16 DIAGNOSIS — Z1283 Encounter for screening for malignant neoplasm of skin: Secondary | ICD-10-CM | POA: Diagnosis not present

## 2017-02-16 DIAGNOSIS — L821 Other seborrheic keratosis: Secondary | ICD-10-CM | POA: Diagnosis not present

## 2017-02-16 DIAGNOSIS — Z85828 Personal history of other malignant neoplasm of skin: Secondary | ICD-10-CM | POA: Diagnosis not present

## 2017-02-16 DIAGNOSIS — L578 Other skin changes due to chronic exposure to nonionizing radiation: Secondary | ICD-10-CM | POA: Diagnosis not present

## 2017-02-16 DIAGNOSIS — L812 Freckles: Secondary | ICD-10-CM | POA: Diagnosis not present

## 2017-02-21 ENCOUNTER — Telehealth: Payer: Self-pay | Admitting: Internal Medicine

## 2017-02-21 ENCOUNTER — Other Ambulatory Visit (INDEPENDENT_AMBULATORY_CARE_PROVIDER_SITE_OTHER): Payer: Medicare HMO

## 2017-02-21 ENCOUNTER — Other Ambulatory Visit: Payer: Self-pay

## 2017-02-21 DIAGNOSIS — E7800 Pure hypercholesterolemia, unspecified: Secondary | ICD-10-CM

## 2017-02-21 DIAGNOSIS — I10 Essential (primary) hypertension: Secondary | ICD-10-CM

## 2017-02-21 LAB — LIPID PANEL
CHOL/HDL RATIO: 2
CHOLESTEROL: 144 mg/dL (ref 0–200)
HDL: 75 mg/dL (ref >39.00)
LDL Cholesterol: 50 mg/dL (ref 0–99)
NonHDL: 69.13
Triglycerides: 98 mg/dL (ref 0.0–149.0)
VLDL: 19.6 mg/dL (ref 0.0–40.0)

## 2017-02-21 LAB — BASIC METABOLIC PANEL
BUN: 19 mg/dL (ref 6–23)
CALCIUM: 10 mg/dL (ref 8.4–10.5)
CHLORIDE: 103 meq/L (ref 96–112)
CO2: 30 meq/L (ref 19–32)
CREATININE: 0.8 mg/dL (ref 0.40–1.20)
GFR: 72.28 mL/min (ref >60.00)
Glucose, Bld: 101 mg/dL — ABNORMAL HIGH (ref 70–99)
Potassium: 3.9 mEq/L (ref 3.5–5.1)
SODIUM: 140 meq/L (ref 135–145)

## 2017-02-21 LAB — HEPATIC FUNCTION PANEL
ALBUMIN: 4.1 g/dL (ref 3.5–5.2)
ALK PHOS: 45 U/L (ref 39–117)
ALT: 14 U/L (ref 0–35)
AST: 22 U/L (ref 0–37)
BILIRUBIN DIRECT: 0.2 mg/dL (ref 0.0–0.3)
Total Bilirubin: 1.2 mg/dL (ref 0.2–1.2)
Total Protein: 6.5 g/dL (ref 6.0–8.3)

## 2017-02-21 MED ORDER — IRBESARTAN 75 MG PO TABS: ORAL_TABLET | ORAL | 0 refills | Status: DC

## 2017-02-21 NOTE — Telephone Encounter (Signed)
Pt is requesting a refill on irbesartan (AVAPRO) 75 MG tablet. Please send to [place].

## 2017-02-22 ENCOUNTER — Other Ambulatory Visit: Payer: Self-pay | Admitting: Internal Medicine

## 2017-02-23 DIAGNOSIS — M48062 Spinal stenosis, lumbar region with neurogenic claudication: Secondary | ICD-10-CM | POA: Diagnosis not present

## 2017-02-23 DIAGNOSIS — M5416 Radiculopathy, lumbar region: Secondary | ICD-10-CM | POA: Diagnosis not present

## 2017-02-23 DIAGNOSIS — M5136 Other intervertebral disc degeneration, lumbar region: Secondary | ICD-10-CM | POA: Diagnosis not present

## 2017-02-24 ENCOUNTER — Other Ambulatory Visit: Payer: Medicare HMO

## 2017-02-28 ENCOUNTER — Encounter: Payer: Self-pay | Admitting: Internal Medicine

## 2017-02-28 ENCOUNTER — Ambulatory Visit (INDEPENDENT_AMBULATORY_CARE_PROVIDER_SITE_OTHER): Payer: Medicare HMO | Admitting: Internal Medicine

## 2017-02-28 VITALS — BP 120/62 | HR 62 | Temp 98.5°F | Resp 18 | Wt 143.8 lb

## 2017-02-28 DIAGNOSIS — R739 Hyperglycemia, unspecified: Secondary | ICD-10-CM

## 2017-02-28 DIAGNOSIS — E7800 Pure hypercholesterolemia, unspecified: Secondary | ICD-10-CM

## 2017-02-28 DIAGNOSIS — R600 Localized edema: Secondary | ICD-10-CM

## 2017-02-28 DIAGNOSIS — G8929 Other chronic pain: Secondary | ICD-10-CM | POA: Diagnosis not present

## 2017-02-28 DIAGNOSIS — K22719 Barrett's esophagus with dysplasia, unspecified: Secondary | ICD-10-CM

## 2017-02-28 DIAGNOSIS — I10 Essential (primary) hypertension: Secondary | ICD-10-CM | POA: Diagnosis not present

## 2017-02-28 DIAGNOSIS — Z85038 Personal history of other malignant neoplasm of large intestine: Secondary | ICD-10-CM | POA: Diagnosis not present

## 2017-02-28 DIAGNOSIS — M545 Low back pain: Secondary | ICD-10-CM

## 2017-02-28 NOTE — Progress Notes (Signed)
Patient ID: [name], female   DOB: 01-19-1929, 84 y.o.   MRN: [id number]   Subjective:    Patient ID: [name], female    DOB: 1928/10/25, 84 y.o.   MRN: [id number]  HPI  Patient here for a scheduled follow up.  She is accompanied by her husband.  History obtained from both of them.  She is doing relatively well.  Is having some increased back pain. Sees Dr Sharlet Salina.  Overall has been stable.  No chest pain.  No sob.  No acid relfux. No abdominal pain.  Bowels moving.  Tolerating her blood pressure medication.  Blood pressures improved.  Now averaging 120-140/60-70s.    Past Medical History:  Diagnosis Date  . Barrett's esophagus   . Bowel obstruction (HCC)    s/p adhesion resection  . Chronic back pain   . Colon cancer ([place]) 1988 and 1989   adenocarcinoma, s/p resection x  2 and chemo  . Diverticulitis   . GERD (gastroesophageal reflux disease)   . H/O open leg wound   . Hiatal hernia   . Hypercholesteremia   . Hypertension   . Lumbar scoliosis   . OA (osteoarthritis)   . Peripheral neuropathy   . Recurrent sinus infections   . Renal cyst   . S/P chemotherapy, time since greater than 12 weeks    colon cancer  . Vertigo    Past Surgical History:  Procedure Laterality Date  . ABDOMINAL HYSTERECTOMY  1982  . adhesions resected     bowel obstruction  . APPENDECTOMY    . BREAST BIOPSY Left    negative 06/14/1985  . CHOLECYSTECTOMY  1994  . COLON RESECTION     x2.  s/p colon cancer  . [place]  . EXCISIONAL HEMORRHOIDECTOMY     with tubal ligation  . EXPLORATORY LAPAROTOMY  1991   Secondary to SBO   Family History  Problem Relation Age of Onset  . Breast cancer Mother   . Asthma Mother   . Stroke Father   . Hypertension Father   . Diabetes Father   . Ovarian cancer Sister        x2  . Prostate cancer Brother   . Hypertension Brother        x3  . Hypercholesterolemia Brother        x3  . Diabetes Brother   .  Hematuria Neg Hx   . Kidney cancer Neg Hx   . Kidney disease Neg Hx   . Sickle cell trait Neg Hx   . Tuberculosis Neg Hx    Social History   Socioeconomic History  . Marital status: Married    Spouse name: None  . Number of children: 4  . Years of education: 12th grade  . Highest education level: None  Social Needs  . Financial resource strain: None  . Food insecurity - worry: None  . Food insecurity - inability: None  . Transportation needs - medical: None  . Transportation needs - non-medical: None  Occupational History  . Occupation: homemaker  Tobacco Use  . Smoking status: Never Smoker  . Smokeless tobacco: Never Used  Substance and Sexual Activity  . Alcohol use: No    Alcohol/week: 0.0 oz  . Drug use: No  . Sexual activity: No  Other Topics Concern  . None  Social History Narrative  . None    Outpatient Encounter Medications as of 02/28/2017  Medication  Sig  . amLODipine (NORVASC) 10 MG tablet TAKE 1 TABLET EVERY DAY  . Calcium Carbonate-Vitamin D (CALCIUM 600 + D PO) Take 600 Units by mouth 2 (two) times daily with a meal.   . Docusate Calcium (STOOL SOFTENER PO)   . fexofenadine (ALLEGRA) 60 MG tablet Take 1 tablet (60 mg total) by mouth daily.  . fish oil-omega-3 fatty acids 1000 MG capsule Take 2 g by mouth daily.  . irbesartan (AVAPRO) 75 MG tablet TAKE 1 TABLET (75 MG TOTAL) DAILY BY MOUTH.  . meclizine (ANTIVERT) 25 MG tablet Take 25 mg by mouth 3 (three) times daily as needed.  . meloxicam (MOBIC) 7.5 MG tablet Take 7.5 mg by mouth daily.  . metoprolol succinate (TOPROL-XL) 25 MG 24 hr tablet Take 1 tablet (25 mg total) by mouth daily.  . ondansetron (ZOFRAN) 4 MG tablet Take 1 tablet (4 mg total) by mouth 2 (two) times daily as needed for nausea or vomiting.  Marland Kitchen oxyCODONE (OXY IR/ROXICODONE) 5 MG immediate release tablet Take 5 mg by mouth 3 (three) times daily.   . pantoprazole (PROTONIX) 40 MG tablet Take 40 mg by mouth 2 (two) times daily.  .  simvastatin (ZOCOR) 10 MG tablet Take 1 tablet (10 mg total) by mouth at bedtime.  . traMADol-acetaminophen (ULTRACET) 37.5-325 MG per tablet Take 1 tablet by mouth 2 (two) times daily as needed.   . [DISCONTINUED] irbesartan (AVAPRO) 75 MG tablet TAKE 1 TABLET (75 MG TOTAL) DAILY BY MOUTH.   No facility-administered encounter medications on file as of 02/28/2017.     Review of Systems  Constitutional: Negative for appetite change and unexpected weight change.  HENT: Negative for congestion and sinus pressure.   Respiratory: Negative for cough, chest tightness and shortness of breath.   Cardiovascular: Negative for chest pain, palpitations and leg swelling.  Gastrointestinal: Negative for abdominal pain, diarrhea, nausea and vomiting.  Genitourinary: Negative for difficulty urinating and dysuria.  Musculoskeletal: Positive for back pain. Negative for joint swelling.  Skin: Negative for color change and rash.  Neurological: Negative for dizziness, light-headedness and headaches.  Psychiatric/Behavioral: Negative for agitation and dysphoric mood.       Objective:    Physical Exam  Constitutional: She appears well-developed and well-nourished. No distress.  HENT:  Nose: Nose normal.  Mouth/Throat: Oropharynx is clear and moist.  Neck: Neck supple. No thyromegaly present.  Cardiovascular: Normal rate and regular rhythm.  Pulmonary/Chest: Breath sounds normal. No respiratory distress. She has no wheezes.  Abdominal: Soft. Bowel sounds are normal. There is no tenderness.  Musculoskeletal: She exhibits no edema or tenderness.  Lymphadenopathy:    She has no cervical adenopathy.  Skin: No rash noted. No erythema.  Psychiatric: She has a normal mood and affect. Her behavior is normal.    BP 120/62 (BP Location: Left Arm, Patient Position: Sitting, Cuff Size: Normal)   Pulse 62   Temp 98.5 F (36.9 C) (Oral)   Resp 18   Wt 143 lb 12.8 oz (65.2 kg)   SpO2 96%   BMI 27.17 kg/m  Wt  Readings from Last 3 Encounters:  02/28/17 143 lb 12.8 oz (65.2 kg)  12/27/16 140 lb (63.5 kg)  12/22/16 143 lb 9.6 oz (65.1 kg)     Lab Results  Component Value Date   WBC 7.0 10/13/2016   HGB 13.8 10/13/2016   HCT 40.8 10/13/2016   PLT 320.0 10/13/2016   GLUCOSE 101 (H) 02/21/2017   CHOL 144 02/21/2017   TRIG  98.0 02/21/2017   HDL 75.00 02/21/2017   LDLCALC 50 02/21/2017   ALT 14 02/21/2017   AST 22 02/21/2017   NA 140 02/21/2017   K 3.9 02/21/2017   CL 103 02/21/2017   CREATININE 0.80 02/21/2017   BUN 19 02/21/2017   CO2 30 02/21/2017   TSH 1.84 10/13/2016   INR 0.91 06/11/2015   HGBA1C 5.3 04/12/2016    Mm Digital Screening Bilateral  Result Date: 11/08/2016 CLINICAL DATA:  Screening. EXAM: DIGITAL SCREENING BILATERAL MAMMOGRAM WITH CAD COMPARISON:  Previous exam(s). ACR Breast Density Category b: There are scattered areas of fibroglandular density. FINDINGS: There are no findings suspicious for malignancy. Images were processed with CAD. IMPRESSION: No mammographic evidence of malignancy. A result letter of this screening mammogram will be mailed directly to the patient. RECOMMENDATION: Screening mammogram in one year. (Code:SM-B-01Y) BI-RADS CATEGORY  1: Negative. Electronically Signed   By: Lajean Manes M.D.   On: 11/08/2016 13:32       Assessment & Plan:   Problem List Items Addressed This Visit    Barrett's esophagus    On protonix.  No upper symptoms.  Followed by GI.       Bilateral lower extremity edema    Legs doing better.  Continue compression hose.       Chronic back pain    Followed by Dr Sharlet Salina.       Hemochromatosis    Followed by hematology.       History of colon cancer    Followed by GI.       Hypercholesteremia    Low cholesterol diet and exercise.  On simvastatin.  Follow lipid panel and liver function tests.        Relevant Medications   irbesartan (AVAPRO) 75 MG tablet   Other Relevant Orders   Hepatic function panel    Lipid panel   Hypertension    Blood pressure doing better on current regimen.  Continue.  Follow pressures.  Follow metabolic panel.        Relevant Medications   irbesartan (AVAPRO) 75 MG tablet   Other Relevant Orders   Basic metabolic panel    Other Visit Diagnoses    Hyperglycemia    -  Primary   Relevant Orders   Hemoglobin A1c       [name], MD

## 2017-03-03 ENCOUNTER — Encounter: Payer: Self-pay | Admitting: Internal Medicine

## 2017-03-03 MED ORDER — IRBESARTAN 75 MG PO TABS: ORAL_TABLET | ORAL | 1 refills | Status: DC

## 2017-03-03 NOTE — Assessment & Plan Note (Signed)
On protonix.  No upper symptoms.  Followed by GI.

## 2017-03-03 NOTE — Assessment & Plan Note (Signed)
Legs doing better.  Continue compression hose.

## 2017-03-03 NOTE — Assessment & Plan Note (Signed)
Followed by GI

## 2017-03-03 NOTE — Assessment & Plan Note (Signed)
Followed by hematology 

## 2017-03-03 NOTE — Assessment & Plan Note (Signed)
Low cholesterol diet and exercise.  On simvastatin.  Follow lipid panel and liver function tests.   

## 2017-03-03 NOTE — Assessment & Plan Note (Signed)
Blood pressure doing better on current regimen.  Continue.  Follow pressures.  Follow metabolic panel.

## 2017-03-03 NOTE — Assessment & Plan Note (Signed)
Followed by Dr Chasnis.   

## 2017-03-21 DIAGNOSIS — M62830 Muscle spasm of back: Secondary | ICD-10-CM | POA: Diagnosis not present

## 2017-03-21 DIAGNOSIS — M5416 Radiculopathy, lumbar region: Secondary | ICD-10-CM | POA: Diagnosis not present

## 2017-03-21 DIAGNOSIS — M48062 Spinal stenosis, lumbar region with neurogenic claudication: Secondary | ICD-10-CM | POA: Diagnosis not present

## 2017-03-21 DIAGNOSIS — M5136 Other intervertebral disc degeneration, lumbar region: Secondary | ICD-10-CM | POA: Diagnosis not present

## 2017-03-31 ENCOUNTER — Ambulatory Visit: Payer: Medicare HMO

## 2017-04-06 ENCOUNTER — Ambulatory Visit: Payer: Medicare HMO

## 2017-04-16 ENCOUNTER — Other Ambulatory Visit: Payer: Self-pay | Admitting: Family

## 2017-04-26 ENCOUNTER — Ambulatory Visit (INDEPENDENT_AMBULATORY_CARE_PROVIDER_SITE_OTHER): Payer: Medicare HMO

## 2017-04-26 VITALS — BP 124/62 | HR 60 | Temp 98.0°F | Resp 14 | Ht 61.0 in | Wt 144.8 lb

## 2017-04-26 DIAGNOSIS — Z0000 Encounter for general adult medical examination without abnormal findings: Secondary | ICD-10-CM | POA: Diagnosis not present

## 2017-04-26 NOTE — Progress Notes (Addendum)
Subjective:   [name] is a 84 y.o. female who presents for Medicare Annual (Subsequent) preventive examination.  Review of Systems:  No ROS.  Medicare Wellness Visit. Additional risk factors are reflected in the social history.  Cardiac Risk Factors include: advanced age (>30men, >65 women);hypertension     Objective:     Vitals: BP 124/62 (BP Location: Right Arm, Patient Position: Sitting, Cuff Size: Normal)   Pulse 60   Temp 98 F (36.7 C) (Oral)   Resp 14   Ht 5\' 1"  (1.549 m)   Wt 144 lb 12.8 oz (65.7 kg)   SpO2 95%   BMI 27.36 kg/m   Body mass index is 27.36 kg/m.  Advanced Directives 04/26/2017 08/11/2016 04/13/2016 04/12/2016 03/30/2016 11/24/2015 11/10/2015  Does Patient Have a Medical Advance Directive? Yes Yes Yes No Yes Yes No  Type of Paramedic of [place];Living will [place];Living will [place];Living will [place];Living will [place];Living will Living will;Healthcare Power of Attorney -  Does patient want to make changes to medical advance directive? No - Patient declined - No - Patient declined - - - -  Copy of [place] in Chart? Yes No - copy requested No - copy requested No - copy requested No - copy requested No - copy requested -  Would patient like information on creating a medical advance directive? - - No - Patient declined No - Patient declined - - No - patient declined information    Tobacco Social History   Tobacco Use  Smoking Status Never Smoker  Smokeless Tobacco Never Used     Counseling given: Not Answered   Clinical Intake:  Pre-visit preparation completed: Yes  Pain : No/denies pain     Nutritional Status: BMI 25 -29 Overweight Diabetes: No  How often do you need to have someone help you when you read instructions, pamphlets, or other written materials from your doctor or pharmacy?: 1 -  Never  Interpreter Needed?: No     Past Medical History:  Diagnosis Date  . Barrett's esophagus   . Bowel obstruction (HCC)    s/p adhesion resection  . Chronic back pain   . Colon cancer ([place]) 1988 and 1989   adenocarcinoma, s/p resection x  2 and chemo  . Diverticulitis   . GERD (gastroesophageal reflux disease)   . H/O open leg wound   . Hiatal hernia   . Hypercholesteremia   . Hypertension   . Lumbar scoliosis   . OA (osteoarthritis)   . Peripheral neuropathy   . Recurrent sinus infections   . Renal cyst   . S/P chemotherapy, time since greater than 12 weeks    colon cancer  . Vertigo    Past Surgical History:  Procedure Laterality Date  . ABDOMINAL HYSTERECTOMY  1982  . adhesions resected     bowel obstruction  . APPENDECTOMY    . BREAST BIOPSY Left    negative 06/14/1985  . CHOLECYSTECTOMY  1994  . COLON RESECTION     x2.  s/p colon cancer  . [place]  . EXCISIONAL HEMORRHOIDECTOMY     with tubal ligation  . EXPLORATORY LAPAROTOMY  1991   Secondary to SBO   Family History  Problem Relation Age of Onset  . Breast cancer Mother   . Asthma Mother   . Stroke Father   . Hypertension  Father   . Diabetes Father   . Ovarian cancer Sister        x2  . Prostate cancer Brother   . Hypertension Brother        x3  . Hypercholesterolemia Brother        x3  . Diabetes Brother   . Spina bifida Grandchild   . Hematuria Neg Hx   . Kidney cancer Neg Hx   . Kidney disease Neg Hx   . Sickle cell trait Neg Hx   . Tuberculosis Neg Hx    Social History   Socioeconomic History  . Marital status: Married    Spouse name: None  . Number of children: 4  . Years of education: 12th grade  . Highest education level: None  Social Needs  . Financial resource strain: Not hard at all  . Food insecurity - worry: Never true  . Food insecurity - inability: Never true  . Transportation needs - medical: No  . Transportation needs -  non-medical: No  Occupational History  . Occupation: homemaker  Tobacco Use  . Smoking status: Never Smoker  . Smokeless tobacco: Never Used  Substance and Sexual Activity  . Alcohol use: No    Alcohol/week: 0.0 oz  . Drug use: No  . Sexual activity: No  Other Topics Concern  . None  Social History Narrative  . None    Outpatient Encounter Medications as of 04/26/2017  Medication Sig  . amLODipine (NORVASC) 10 MG tablet TAKE 1 TABLET EVERY DAY  . Calcium Carbonate-Vitamin D (CALCIUM 600 + D PO) Take 600 Units by mouth 2 (two) times daily with a meal.   . Docusate Calcium (STOOL SOFTENER PO)   . fexofenadine (ALLEGRA) 60 MG tablet Take 1 tablet (60 mg total) by mouth daily.  . fish oil-omega-3 fatty acids 1000 MG capsule Take 2 g by mouth daily.  . irbesartan (AVAPRO) 75 MG tablet TAKE 1 TABLET (75 MG TOTAL) DAILY BY MOUTH.  . meclizine (ANTIVERT) 25 MG tablet Take 25 mg by mouth 3 (three) times daily as needed.  . meloxicam (MOBIC) 7.5 MG tablet Take 7.5 mg by mouth daily.  . metoprolol succinate (TOPROL-XL) 25 MG 24 hr tablet Take 1 tablet (25 mg total) by mouth daily.  . ondansetron (ZOFRAN) 4 MG tablet Take 1 tablet (4 mg total) by mouth 2 (two) times daily as needed for nausea or vomiting.  Marland Kitchen oxyCODONE (OXY IR/ROXICODONE) 5 MG immediate release tablet Take 5 mg by mouth 3 (three) times daily.   . pantoprazole (PROTONIX) 40 MG tablet Take 40 mg by mouth 2 (two) times daily.  . simvastatin (ZOCOR) 10 MG tablet Take 1 tablet (10 mg total) by mouth at bedtime.  . traMADol-acetaminophen (ULTRACET) 37.5-325 MG per tablet Take 1 tablet by mouth 2 (two) times daily as needed.    No facility-administered encounter medications on file as of 04/26/2017.     Activities of Daily Living In your present state of health, do you have any difficulty performing the following activities: 04/26/2017  Hearing? N  Vision? N  Difficulty concentrating or making decisions? N  Walking or climbing  stairs? N  Dressing or bathing? N  Doing errands, shopping? Y  Comment She does not Physiological scientist and eating ? N  Using the Toilet? N  In the past six months, have you accidently leaked urine? Y  Comment Managed with a daily pad  Do you have problems with loss of bowel control?  N  Managing your Medications? N  Managing your Finances? Y  Comment Husband assists  Housekeeping or managing your Housekeeping? N  Some recent data might be hidden    Patient Care Team: [name], MD as PCP - General (Internal Medicine)    Assessment:   This is a routine wellness examination for Toyna.  The goal of the wellness visit is to assist the patient how to close the gaps in care and create a preventative care plan for the patient.   The roster of all physicians providing medical care to patient is listed in the Snapshot section of the chart.  Taking calcium VIT D as appropriate/Osteoporosis risk reviewed.  Dexa Scan discussed.  Educational material provided.  Safety issues reviewed; Smoke and carbon monoxide detectors in the home. No firearms or firearms locked in a safe within the home. Wears seatbelts when riding with others. No violence in the home.  They do not have excessive sun exposure.  Discussed the need for sun protection: hats, long sleeves and the use of sunscreen if there is significant sun exposure.  Patient is alert, normal appearance, oriented to person/place/and time. Correctly identified the president of the [place] and recalls of 3/3 words.  Performs simple calculations and can read correct time from watch face. Displays appropriate judgement.  No new identified risk were noted.  No failures at ADL's or IADL's.  Ambulates with roll aid walker.   BMI- discussed the importance of a healthy diet, water intake and the benefits of aerobic exercise. Educational material provided.   24 hour diet recall: Regular diet  Dental- every 6 months.  Dr. Kenton Kingfisher.  Eye- Visual  acuity not assessed per patient preference since they have regular follow up with the ophthalmologist.  Wears contact lens on L eye only or glasses.    Sleep patterns- Sleeps 6 hours.  Naps during the day.  Dexa Scan deferred per patient request. Educational material provided.  Exercise Activities and Dietary recommendations Current Exercise Habits: The patient does not participate in regular exercise at present  Goals    . Healthy Lifestyle     Stay active and do chair exercises as demonstrated  Stay hydrated, drink plenty of water Low carb foods       Fall Risk Fall Risk  04/26/2017 08/15/2016 03/30/2016 12/17/2015 11/17/2015  Falls in the past year? No No No No No   Depression Screen PHQ 2/9 Scores 04/26/2017 08/15/2016 03/30/2016 12/17/2015  PHQ - 2 Score 0 0 0 0     Cognitive Function MMSE - Mini Mental State Exam 03/30/2016 03/31/2015  Orientation to time 5 5  Orientation to Place 5 5  Registration 3 3  Attention/ Calculation 5 5  Recall 3 3  Language- name 2 objects 2 2  Language- repeat 1 1  Language- follow 3 step command 3 3  Language- read & follow direction 1 1  Write a sentence 1 1  Copy design 1 1  Total score 30 30        Immunization History  Administered Date(s) Administered  . Influenza Split 12/08/2011  . Influenza, High Dose Seasonal PF 11/17/2015, 10/18/2016  . Influenza,inj,Quad PF,6+ Mos 10/31/2013, 10/23/2014  . Influenza-Unspecified 11/17/2015  . Pneumococcal Conjugate-13 05/07/2013  . Pneumococcal-Unspecified 11/30/2000, 01/04/2006  . Td 11/20/2007    Screening Tests Health Maintenance  Topic Date Due  . DEXA SCAN  02/05/1996  . MAMMOGRAM  11/08/2017  . TETANUS/TDAP  11/19/2017  . INFLUENZA VACCINE  Completed  . PNA  vac Low Risk Adult  Completed      Plan:    End of life planning; Advance aging; Advanced directives discussed. Copy of current HCPOA/Living Will on file.    I have personally reviewed and noted the following in the  patient's chart:   . Medical and social history . Use of alcohol, tobacco or illicit drugs  . Current medications and supplements . Functional ability and status . Nutritional status . Physical activity . Advanced directives . List of other physicians . Hospitalizations, surgeries, and ER visits in previous 12 months . Vitals . Screenings to include cognitive, depression, and falls . Referrals and appointments  In addition, I have reviewed and discussed with patient certain preventive protocols, quality metrics, and best practice recommendations. A written personalized care plan for preventive services as well as general preventive health recommendations were provided to patient.     OBrien-Blaney, Denisa L, LPN  [id number]/98/1191    I have reviewed the above information and agree with above.   Deborra Medina, MD

## 2017-04-26 NOTE — Patient Instructions (Addendum)
  [name] , Thank you for taking time to come for your Medicare Wellness Visit. I appreciate your ongoing commitment to your health goals. Please review the following plan we discussed and let me know if I can assist you in the future.   Follow up with Dr. Nicki Reaper as needed.    Have a great day!  These are the goals we discussed: Goals    . Healthy Lifestyle     Stay active and do chair exercises as demonstrated  Stay hydrated, drink plenty of water Low carb foods       This is a list of the screening recommended for you and due dates:  Health Maintenance  Topic Date Due  . DEXA scan (bone density measurement)  02/05/1996  . Mammogram  11/08/2017  . Tetanus Vaccine  11/19/2017  . Flu Shot  Completed  . Pneumonia vaccines  Completed   Bone Densitometry Bone densitometry is an imaging test that uses a special X-ray to measure the amount of calcium and other minerals in your bones (bone density). This test is also known as a bone mineral density test or dual-energy X-ray absorptiometry (DXA). The test can measure bone density at your hip and your spine. It is similar to having a regular X-ray. You may have this test to:  Diagnose a condition that causes weak or thin bones (osteoporosis).  Predict your risk of a broken bone (fracture).  Determine how well osteoporosis treatment is working.  Tell a health care provider about:  Any allergies you have.  All medicines you are taking, including vitamins, herbs, eye drops, creams, and over-the-counter medicines.  Any problems you or family members have had with anesthetic medicines.  Any blood disorders you have.  Any surgeries you have had.  Any medical conditions you have.  Possibility of pregnancy.  Any other medical test you had within the previous 14 days that used contrast material. What are the risks? Generally, this is a safe procedure. However, problems can occur and may include the following:  This test exposes  you to a very small amount of radiation.  The risks of radiation exposure may be greater to unborn children.  What happens before the procedure?  Do not take any calcium supplements for 24 hours before having the test. You can otherwise eat and drink what you usually do.  Take off all metal jewelry, eyeglasses, dental appliances, and any other metal objects. What happens during the procedure?  You may lie on an exam table. There will be an X-ray generator below you and an imaging device above you.  Other devices, such as boxes or braces, may be used to position your body properly for the scan.  You will need to lie still while the machine slowly scans your body.  The images will show up on a computer monitor. What happens after the procedure? You may need more testing at a later time. This information is not intended to replace advice given to you by your health care provider. Make sure you discuss any questions you have with your health care provider. Document Released: 02/23/2004 Document Revised: 07/09/2015 Document Reviewed: 07/11/2013 Elsevier Interactive Patient Education  2018 [employer].

## 2017-04-28 ENCOUNTER — Other Ambulatory Visit: Payer: Self-pay | Admitting: Internal Medicine

## 2017-05-05 DIAGNOSIS — M62830 Muscle spasm of back: Secondary | ICD-10-CM | POA: Diagnosis not present

## 2017-05-05 DIAGNOSIS — M5416 Radiculopathy, lumbar region: Secondary | ICD-10-CM | POA: Diagnosis not present

## 2017-05-05 DIAGNOSIS — M5136 Other intervertebral disc degeneration, lumbar region: Secondary | ICD-10-CM | POA: Diagnosis not present

## 2017-05-12 ENCOUNTER — Emergency Department: Payer: Medicare HMO

## 2017-05-12 ENCOUNTER — Emergency Department
Admission: EM | Admit: 2017-05-12 | Discharge: 2017-05-12 | Disposition: A | Discharge status: Home / Self Care | Payer: Medicare HMO | Attending: Emergency Medicine | Admitting: Emergency Medicine

## 2017-05-12 ENCOUNTER — Ambulatory Visit: Payer: Self-pay | Admitting: *Deleted

## 2017-05-12 ENCOUNTER — Encounter: Payer: Self-pay | Admitting: Emergency Medicine

## 2017-05-12 ENCOUNTER — Other Ambulatory Visit: Payer: Self-pay

## 2017-05-12 DIAGNOSIS — I10 Essential (primary) hypertension: Secondary | ICD-10-CM | POA: Insufficient documentation

## 2017-05-12 DIAGNOSIS — M25551 Pain in right hip: Secondary | ICD-10-CM | POA: Insufficient documentation

## 2017-05-12 DIAGNOSIS — M79661 Pain in right lower leg: Secondary | ICD-10-CM | POA: Diagnosis not present

## 2017-05-12 DIAGNOSIS — Z85038 Personal history of other malignant neoplasm of large intestine: Secondary | ICD-10-CM | POA: Insufficient documentation

## 2017-05-12 DIAGNOSIS — M79604 Pain in right leg: Secondary | ICD-10-CM | POA: Insufficient documentation

## 2017-05-12 DIAGNOSIS — Z79899 Other long term (current) drug therapy: Secondary | ICD-10-CM | POA: Insufficient documentation

## 2017-05-12 NOTE — ED Triage Notes (Signed)
Describes pain to right hip and down right leg on March 20.  Pain continued and was seen on March 22 and given a trigger point injection.  Patient states she thought pain had improved, but since injection pain to right hip and leg has worsened and c/o right foot and up to mid lower leg is cold and painful.

## 2017-05-12 NOTE — ED Notes (Signed)
First Nurse Note:  Patient presents to the ED for right leg pain and coolness.  Patient has on thick hose so it was difficult to assess leg in waiting room.  Patient reports symptoms have been going on for several days.  Patient states, "Dr. Nicki Reaper wanted me to come over here and check for a blood clot."

## 2017-05-12 NOTE — Telephone Encounter (Signed)
Patient phoned in with complaint that her Rt leg below the knee to the tips of her toes has felt cold to touch for approximately 8-9 days now.  She denies any discoloration and swelling of the area. Denies chest pain, SOB. After walking on the leg for approx. 1 minute, it becomes very painful.  Referred pat to seek care at the ED. Husband will drive her to [facility] ED. Reason for Disposition . Entire foot is cool or blue in comparison to other side  Answer Assessment - Initial Assessment Questions Visit for Rt hip pain last Friday and had a shot in that hip for pain. Rt leg below the knee down to the toes feel cold to touch. She stated the pain Dr. Did assess the cold leg. Was told to get off leg and wrap it up and use heating pad. She did this several times this week and it still remains cold to touch and hurts when she stands on it. Taking Tramadol 1-2 times a day for this leg pain.  1. ONSET: "When did the pain start?"      8-9 days now the leg has felt cold.  2. LOCATION: "Where is the pain located?"      Left leg feels cold.  3. PAIN: "How bad is the pain?"    (Scale 1-10; or mild, moderate, severe)   -  MILD (1-3): doesn't interfere with normal activities    -  MODERATE (4-7): interferes with normal activities (e.g., work or school) or awakens from sleep, limping    -  SEVERE (8-10): excruciating pain, unable to do any normal activities, unable to walk    Moderate. Can walk a few minutes but then has to get off that leg. 4. WORK OR EXERCISE: "Has there been any recent work or exercise that involved this part of the body?"    no 5. CAUSE: "What do you think is causing the leg pain?"    No idea 6. OTHER SYMPTOMS: "Do you have any other symptoms?" (e.g., chest pain, back pain, breathing difficulty, swelling, rash, fever, numbness, weakness)    Hip pain she describes- she stated it starts near the rt hip and  Butt cheek and goes down to where the leg becomes cold. 7. PREGNANCY: "Is there any  chance you are pregnant?" "When was your last menstrual period?"   no  Protocols used: LEG PAIN-A-AH

## 2017-05-12 NOTE — Discharge Instructions (Addendum)
Please follow-up with your doctor soon as possible regarding right leg pain.  Return to the emergency department for any significant worsening of pain, or inability to walk due to pain.

## 2017-05-12 NOTE — ED Provider Notes (Signed)
[facility] Emergency Department Provider Note  Time seen: 3:49 PM  I have reviewed the triage vital signs and the nursing notes.   HISTORY  Chief Complaint No chief complaint on file.    HPI [name] is a 84 y.o. female with a past medical history of chronic back pain, gastric reflux, hypertension, hyperlipidemia, presents to the emergency department with right leg pain.  According to the patient for the past 9-10 days she has been expensing pain in her right hip that goes down into her right leg.  Saw her pain doctor approximate 1 week ago and had injections performed in her lower back.  She states initially they seem to help but over the past few days she feels like the pain has returned and worsened.  States she is having pain in the right hip bearing weight but is able to do so.  Denies any falls.  Denies any leg pain in the calf or calf swelling.  No chest pain or trouble breathing.  Largely negative review of systems.   Past Medical History:  Diagnosis Date  . Barrett's esophagus   . Bowel obstruction (HCC)    s/p adhesion resection  . Chronic back pain   . Colon cancer ([place]) 1988 and 1989   adenocarcinoma, s/p resection x  2 and chemo  . Diverticulitis   . GERD (gastroesophageal reflux disease)   . H/O open leg wound   . Hiatal hernia   . Hypercholesteremia   . Hypertension   . Lumbar scoliosis   . OA (osteoarthritis)   . Peripheral neuropathy   . Recurrent sinus infections   . Renal cyst   . S/P chemotherapy, time since greater than 12 weeks    colon cancer  . Vertigo     Patient Active Problem List   Diagnosis Date Noted  . Constipation 08/15/2016  . Small bowel obstruction ([place]) 11/24/2015  . Swelling of left lower extremity 07/16/2015  . Acute Ileitis 06/12/2015  . Hypokalemia 06/12/2015  . Accelerated hypertension 06/12/2015  . Hemochromatosis 05/25/2015  . Leg wound, right 05/19/2015  . Bilateral lower extremity edema  05/19/2015  . Iron excess 04/16/2015  . URI (upper respiratory infection) 01/18/2015  . Dizziness 10/26/2014  . Hyperbilirubinemia 10/26/2014  . Health care maintenance 05/18/2014  . Anemia, iron deficiency 04/21/2014  . Hospital discharge follow-up 04/14/2014  . Sweats, menopausal 03/07/2014  . Face lesion 11/03/2013  . Numbness in feet 10/08/2012  . Nocturia 10/08/2012  . Hypertension 12/18/2011  . Hypercholesteremia 12/18/2011  . History of colon cancer 12/18/2011  . Barrett's esophagus 12/18/2011  . Chronic back pain 12/18/2011  . Osteopenia 12/18/2011    Past Surgical History:  Procedure Laterality Date  . ABDOMINAL HYSTERECTOMY  1982  . adhesions resected     bowel obstruction  . APPENDECTOMY    . BREAST BIOPSY Left    negative 06/14/1985  . CHOLECYSTECTOMY  1994  . COLON RESECTION     x2.  s/p colon cancer  . [place]  . EXCISIONAL HEMORRHOIDECTOMY     with tubal ligation  . EXPLORATORY LAPAROTOMY  1991   Secondary to SBO    Prior to Admission medications   Medication Sig Start Date End Date Taking? Authorizing Provider  amLODipine (NORVASC) 10 MG tablet TAKE 1 TABLET EVERY DAY 02/23/17  Yes [name], MD  Calcium Carbonate-Vitamin D (CALCIUM 600 + D PO) Take 600 Units by mouth  2 (two) times daily with a meal.    Yes [provider]  fexofenadine (ALLEGRA) 60 MG tablet Take 1 tablet (60 mg total) by mouth daily. 04/25/16  Yes [name], MD  irbesartan (AVAPRO) 75 MG tablet TAKE 1 TABLET (75 MG TOTAL) DAILY BY MOUTH. 04/17/17  Yes [name], MD  meclizine (ANTIVERT) 25 MG tablet Take 25 mg by mouth 3 (three) times daily as needed.   Yes [provider]  meloxicam (MOBIC) 7.5 MG tablet Take 7.5 mg by mouth daily.   Yes [provider]  metoprolol succinate (TOPROL-XL) 25 MG 24 hr tablet TAKE 1 TABLET (25 MG TOTAL) BY MOUTH DAILY. 05/01/17  Yes [name], MD  ondansetron (ZOFRAN) 4  MG tablet Take 1 tablet (4 mg total) by mouth 2 (two) times daily as needed for nausea or vomiting. 06/25/15  Yes [name], MD  oxyCODONE (OXY IR/ROXICODONE) 5 MG immediate release tablet Take 5 mg by mouth 3 (three) times daily.    Yes [provider]  pantoprazole (PROTONIX) 40 MG tablet Take 40 mg by mouth 2 (two) times daily.   Yes [provider]  simvastatin (ZOCOR) 10 MG tablet TAKE 1 TABLET (10 MG TOTAL) BY MOUTH AT BEDTIME. 05/01/17  Yes [name], MD  traMADol-acetaminophen (ULTRACET) 37.5-325 MG per tablet Take 1 tablet by mouth 2 (two) times daily as needed.    Yes [provider]    Allergies  Allergen Reactions  . Fentanyl Other (See Comments)    confusion    Family History  Problem Relation Age of Onset  . Breast cancer Mother   . Asthma Mother   . Stroke Father   . Hypertension Father   . Diabetes Father   . Ovarian cancer Sister        x2  . Prostate cancer Brother   . Hypertension Brother        x3  . Hypercholesterolemia Brother        x3  . Diabetes Brother   . Spina bifida Grandchild   . Hematuria Neg Hx   . Kidney cancer Neg Hx   . Kidney disease Neg Hx   . Sickle cell trait Neg Hx   . Tuberculosis Neg Hx     Social History Social History   Tobacco Use  . Smoking status: Never Smoker  . Smokeless tobacco: Never Used  Substance Use Topics  . Alcohol use: No    Alcohol/week: 0.0 oz  . Drug use: No    Review of Systems Constitutional: Negative for fever. Eyes: Negative for visual complaints ENT: Negative for recent illness/congestion Cardiovascular: Negative for chest pain. Respiratory: Negative for shortness of breath. Gastrointestinal: Negative for abdominal pain Genitourinary: Negative for urinary compaints Musculoskeletal: Right hip pain with occasional pain shooting into the right leg.  No leg swelling. Skin: Negative for skin complaints  Neurological: Negative for headache All other ROS  negative  ____________________________________________   PHYSICAL EXAM:  VITAL SIGNS: ED Triage Vitals  Enc Vitals Group     BP 05/12/17 1235 (!) 148/89     Pulse Rate 05/12/17 1235 87     Resp 05/12/17 1235 16     Temp 05/12/17 1235 98.4 F (36.9 C)     Temp Source 05/12/17 1235 Oral     SpO2 05/12/17 1235 97 %     Weight 05/12/17 1233 144 lb (65.3 kg)     Height 05/12/17 1233 5\' 1"  (1.549 m)     Head  Circumference --      Peak Flow --      Pain Score 05/12/17 1232 7     Pain Loc --      Pain Edu? --      Excl. in [place]? --    Constitutional: Alert and oriented. Well appearing and in no distress. Eyes: Normal exam ENT   Head: Normocephalic and atraumatic.   Mouth/Throat: Mucous membranes are moist. Cardiovascular: Normal rate, regular rhythm.  Respiratory: Normal respiratory effort without tachypnea nor retractions. Breath sounds are clear  Gastrointestinal: Soft and nontender. No distention.   Musculoskeletal: Mild right hip tenderness to palpation.  Good range of motion but with mild pain elicited.  Neurovascularly intact distally with good sensation, dopplerable pulses.  No calf edema or tenderness. Neurologic:  Normal speech and language. No gross focal neurologic deficits  Skin:  Skin is warm, dry and intact.  Psychiatric: Mood and affect are normal.   ____________________________________________   RADIOLOGY  X-ray negative Ultrasound negative  ____________________________________________   INITIAL IMPRESSION / ASSESSMENT AND PLAN / ED COURSE  Pertinent labs & imaging results that were available during my care of the patient were reviewed by me and considered in my medical decision making (see chart for details).  Patient presents emergency department with right hip and leg pain.  Differential would include sciatica, fracture, nerve impingement, musculoskeletal pain, DVT.  Overall the patient appears extremely well, she does have mild pain with  palpation and range of motion.  X-rays are negative.  Ultrasound is negative for DVT.  Has good sensation with warm extremity and dopplerable pulses.  Patient has chronic back pain for which she sees a pain specialist.  I suspect likely neuropathic pain/radicular pain.  Recommended she follow-up with her doctor she actually has an appointment on Monday.  Discussed return precautions for worsening pain.  Patient agreeable to this plan of care.  ____________________________________________   FINAL CLINICAL IMPRESSION(S) / ED DIAGNOSES  Right leg pain    Harvest Dark, MD 05/12/17 1553

## 2017-05-15 ENCOUNTER — Encounter: Payer: Self-pay | Admitting: Internal Medicine

## 2017-05-15 ENCOUNTER — Ambulatory Visit (INDEPENDENT_AMBULATORY_CARE_PROVIDER_SITE_OTHER): Payer: Medicare HMO | Admitting: Internal Medicine

## 2017-05-15 DIAGNOSIS — G8929 Other chronic pain: Secondary | ICD-10-CM | POA: Diagnosis not present

## 2017-05-15 DIAGNOSIS — I10 Essential (primary) hypertension: Secondary | ICD-10-CM

## 2017-05-15 DIAGNOSIS — M545 Low back pain: Secondary | ICD-10-CM

## 2017-05-15 DIAGNOSIS — R209 Unspecified disturbances of skin sensation: Secondary | ICD-10-CM | POA: Insufficient documentation

## 2017-05-15 NOTE — Progress Notes (Signed)
Patient ID: [name], female   DOB: 02-Apr-1928, 84 y.o.   MRN: [id number]   Subjective:    Patient ID: [name], female    DOB: [birth year], 84 y.o.   MRN: [id number]  HPI  Patient here as a work in for ER follow up.  She is accompanied by her daughter and her husband.  History obtained from all of them.  She has a history of low back and right hip and leg pain.  Sees Dr Sharlet Salina regularly.  Was evaluated 05/05/17 and given a trigger point injection in the right L5 paraspinal muscle.  She is scheduled for right L4-5 transfemoral ESI later this month.  States she has had increased pain in her right leg and right hip.  Notices more pain when up and walking.  Wears compression hose daily.  States they noticed her foot was cold - over this last week.  Acute change.  States her foot feel different.  Was seen in ER 05/12/17.  Had lower extremity ultrasound that was negative for DVT.  Also had hip xray - no evidence of hip fracture or dislocation.  Was discharged to f/u here today.  Foot remains cool/cold.  Still with increased pain.  No pain while sitting or lying.  States otherwise doing well.  Eating.  No nausea or vomiting.  No abdominal pian.  Bowels moving.  No sob.  No chest pain.     Past Medical History:  Diagnosis Date  . Barrett's esophagus   . Bowel obstruction (HCC)    s/p adhesion resection  . Chronic back pain   . Colon cancer ([place]) 1988 and 1989   adenocarcinoma, s/p resection x  2 and chemo  . Diverticulitis   . GERD (gastroesophageal reflux disease)   . H/O open leg wound   . Hiatal hernia   . Hypercholesteremia   . Hypertension   . Lumbar scoliosis   . OA (osteoarthritis)   . Peripheral neuropathy   . Recurrent sinus infections   . Renal cyst   . S/P chemotherapy, time since greater than 12 weeks    colon cancer  . Vertigo    Past Surgical History:  Procedure Laterality Date  . ABDOMINAL HYSTERECTOMY  1982  . adhesions resected     bowel obstruction  .  APPENDECTOMY    . BREAST BIOPSY Left    negative 06/14/1985  . CHOLECYSTECTOMY  1994  . COLON RESECTION     x2.  s/p colon cancer  . [place]  . EXCISIONAL HEMORRHOIDECTOMY     with tubal ligation  . EXPLORATORY LAPAROTOMY  1991   Secondary to SBO   Family History  Problem Relation Age of Onset  . Breast cancer Mother   . Asthma Mother   . Stroke Father   . Hypertension Father   . Diabetes Father   . Ovarian cancer Sister        x2  . Prostate cancer Brother   . Hypertension Brother        x3  . Hypercholesterolemia Brother        x3  . Diabetes Brother   . Spina bifida Grandchild   . Hematuria Neg Hx   . Kidney cancer Neg Hx   . Kidney disease Neg Hx   . Sickle cell trait Neg Hx   . Tuberculosis Neg Hx    Social History   Socioeconomic History  . Marital status: Married  Spouse name: Not on file  . Number of children: 4  . Years of education: 12th grade  . Highest education level: Not on file  Occupational History  . Occupation: homemaker  Social Needs  . Financial resource strain: Not hard at all  . Food insecurity:    Worry: Never true    Inability: Never true  . Transportation needs:    Medical: No    Non-medical: No  Tobacco Use  . Smoking status: Never Smoker  . Smokeless tobacco: Never Used  Substance and Sexual Activity  . Alcohol use: No    Alcohol/week: 0.0 oz  . Drug use: No  . Sexual activity: Never  Lifestyle  . Physical activity:    Days per week: Not on file    Minutes per session: Not on file  . Stress: Not at all  Relationships  . Social connections:    Talks on phone: Not on file    Gets together: Not on file    Attends religious service: Not on file    Active member of club or organization: Not on file    Attends meetings of clubs or organizations: Not on file    Relationship status: Not on file  Other Topics Concern  . Not on file  Social History Narrative  . Not on file     Outpatient Encounter Medications as of 05/15/2017  Medication Sig  . amLODipine (NORVASC) 10 MG tablet TAKE 1 TABLET EVERY DAY  . Calcium Carbonate-Vitamin D (CALCIUM 600 + D PO) Take 600 Units by mouth 2 (two) times daily with a meal.   . fexofenadine (ALLEGRA) 60 MG tablet Take 1 tablet (60 mg total) by mouth daily.  . irbesartan (AVAPRO) 75 MG tablet TAKE 1 TABLET (75 MG TOTAL) DAILY BY MOUTH.  . meclizine (ANTIVERT) 25 MG tablet Take 25 mg by mouth 3 (three) times daily as needed.  . meloxicam (MOBIC) 7.5 MG tablet Take 7.5 mg by mouth daily.  . metoprolol succinate (TOPROL-XL) 25 MG 24 hr tablet TAKE 1 TABLET (25 MG TOTAL) BY MOUTH DAILY.  Marland Kitchen ondansetron (ZOFRAN) 4 MG tablet Take 1 tablet (4 mg total) by mouth 2 (two) times daily as needed for nausea or vomiting.  Marland Kitchen oxyCODONE (OXY IR/ROXICODONE) 5 MG immediate release tablet Take 5 mg by mouth 3 (three) times daily.   . pantoprazole (PROTONIX) 40 MG tablet Take 40 mg by mouth 2 (two) times daily.  . simvastatin (ZOCOR) 10 MG tablet TAKE 1 TABLET (10 MG TOTAL) BY MOUTH AT BEDTIME.  . traMADol-acetaminophen (ULTRACET) 37.5-325 MG per tablet Take 1 tablet by mouth 2 (two) times daily as needed.    No facility-administered encounter medications on file as of 05/15/2017.     Review of Systems  Constitutional: Negative for appetite change, fever and unexpected weight change.  HENT: Negative for congestion and sinus pressure.   Respiratory: Negative for cough, chest tightness and shortness of breath.   Cardiovascular: Negative for chest pain and palpitations.  Gastrointestinal: Negative for abdominal pain, diarrhea, nausea and vomiting.  Genitourinary: Negative for difficulty urinating and dysuria.  Musculoskeletal: Positive for back pain.       Right hip and leg pain as outlined.    Skin: Negative for rash and wound.  Neurological: Negative for dizziness, light-headedness and headaches.  Psychiatric/Behavioral: Negative for agitation and  dysphoric mood.       Objective:    Physical Exam  Constitutional: She appears well-developed and well-nourished. No distress.  HENT:  Nose: Nose normal.  Mouth/Throat: Oropharynx is clear and moist.  Neck: Neck supple.  Cardiovascular: Normal rate and regular rhythm.  Pulmonary/Chest: Breath sounds normal. No respiratory distress. She has no wheezes.  Abdominal: Soft. Bowel sounds are normal. There is no tenderness.  Musculoskeletal: She exhibits no tenderness.  Right foot cold - unable to palpate DP pulse.  Left foot - palpable DP pulse - foot warm.  No calf tenderness.  No pain with abduction and adduction - right leg.  Some increased pain with rotation of the hip - pain in the groin.  Lymphadenopathy:    She has no cervical adenopathy.  Skin: No rash noted. No erythema.  Psychiatric: She has a normal mood and affect. Her behavior is normal.    BP (!) 144/78 (BP Location: Left Arm)   Pulse (!) 57   Temp 98.1 F (36.7 C) (Oral)   Resp 20   Wt 141 lb 12.8 oz (64.3 kg)   SpO2 98%   BMI 26.79 kg/m  Wt Readings from Last 3 Encounters:  05/15/17 141 lb 12.8 oz (64.3 kg)  05/12/17 144 lb (65.3 kg)  04/26/17 144 lb 12.8 oz (65.7 kg)     Lab Results  Component Value Date   WBC 7.0 10/13/2016   HGB 13.8 10/13/2016   HCT 40.8 10/13/2016   PLT 320.0 10/13/2016   GLUCOSE 101 (H) 02/21/2017   CHOL 144 02/21/2017   TRIG 98.0 02/21/2017   HDL 75.00 02/21/2017   LDLCALC 50 02/21/2017   ALT 14 02/21/2017   AST 22 02/21/2017   NA 140 02/21/2017   K 3.9 02/21/2017   CL 103 02/21/2017   CREATININE 0.80 02/21/2017   BUN 19 02/21/2017   CO2 30 02/21/2017   TSH 1.84 10/13/2016   INR 0.91 06/11/2015   HGBA1C 5.3 04/12/2016    US Venous Img Lower Unilateral Right  Result Date: 05/12/2017 CLINICAL DATA:  Right calf pain and coldness EXAM: RIGHT LOWER EXTREMITY VENOUS DOPPLER ULTRASOUND TECHNIQUE: Gray-scale sonography with graded compression, as well as color Doppler and  duplex ultrasound were performed to evaluate the lower extremity deep venous systems from the level of the common femoral vein and including the common femoral, femoral, profunda femoral, popliteal and calf veins including the posterior tibial, peroneal and gastrocnemius veins when visible. The superficial great saphenous vein was also interrogated. Spectral Doppler was utilized to evaluate flow at rest and with distal augmentation maneuvers in the common femoral, femoral and popliteal veins. COMPARISON:  None. FINDINGS: Contralateral Common Femoral Vein: Respiratory phasicity is normal and symmetric with the symptomatic side. No evidence of thrombus. Normal compressibility. Common Femoral Vein: No evidence of thrombus. Normal compressibility, respiratory phasicity and response to augmentation. Saphenofemoral Junction: No evidence of thrombus. Normal compressibility and flow on color Doppler imaging. Profunda Femoral Vein: No evidence of thrombus. Normal compressibility and flow on color Doppler imaging. Femoral Vein: No evidence of thrombus. Normal compressibility, respiratory phasicity and response to augmentation. Popliteal Vein: No evidence of thrombus. Normal compressibility, respiratory phasicity and response to augmentation. Calf Veins: No evidence of thrombus. Normal compressibility and flow on color Doppler imaging. Superficial Great Saphenous Vein: No evidence of thrombus. Normal compressibility. Venous Reflux:  None. Other Findings:  None. IMPRESSION: No evidence of deep venous thrombosis. Electronically Signed   By: Jerilynn Mages.  Shick M.D.   On: 05/12/2017 14:11   Dg Hip Unilat W Or Wo Pelvis 2-3 Views Right  Result Date: 05/12/2017 CLINICAL DATA:  Right hip pain. EXAM: DG HIP (  WITH OR WITHOUT PELVIS) 2-3V RIGHT COMPARISON:  None. FINDINGS: There is no evidence of hip fracture or dislocation. There is no evidence of arthropathy or other focal bone abnormality. IMPRESSION: Normal right hip. Electronically  Signed   By: Marijo Conception, M.D.   On: 05/12/2017 14:56       Assessment & Plan:   Problem List Items Addressed This Visit    Chronic back pain    Followed by Dr Sharlet Salina.  Had MRI 2016.  Reviewed.  Recently evaluated 05/05/17.  S/p trigger point injection - L5 paraspinal muscle. Due to have ESI later this month.  With increased leg pain now.  Cold foot.  Will pursue vascular w/up.  May need repeat MRI if unrevealing.        Cold foot    With increased pain - with walking.  Foot cold.  Unable to palpate DP pulse on the right.  Refer to vascular surgery for further evaluation and w/up.        Relevant Orders   Ambulatory referral to Vascular Surgery   Hypertension    Blood pressure as outlined.  Continue same medication regimen.  Follow pressures.  Follow metabolic panel.           [name], MD

## 2017-05-16 ENCOUNTER — Other Ambulatory Visit: Payer: Self-pay

## 2017-05-16 ENCOUNTER — Encounter: Payer: Self-pay | Admitting: Internal Medicine

## 2017-05-16 ENCOUNTER — Encounter (HOSPITAL_COMMUNITY): Payer: Medicare HMO

## 2017-05-16 ENCOUNTER — Other Ambulatory Visit: Payer: Self-pay | Admitting: Internal Medicine

## 2017-05-16 ENCOUNTER — Telehealth: Payer: Self-pay | Admitting: Internal Medicine

## 2017-05-16 DIAGNOSIS — R209 Unspecified disturbances of skin sensation: Secondary | ICD-10-CM

## 2017-05-16 NOTE — Assessment & Plan Note (Signed)
Blood pressure as outlined.  Continue same medication regimen.  Follow pressures.  Follow metabolic panel.  

## 2017-05-16 NOTE — Assessment & Plan Note (Signed)
With increased pain - with walking.  Foot cold.  Unable to palpate DP pulse on the right.  Refer to vascular surgery for further evaluation and w/up.

## 2017-05-16 NOTE — Assessment & Plan Note (Signed)
Followed by Dr Sharlet Salina.  Had MRI 2016.  Reviewed.  Recently evaluated 05/05/17.  S/p trigger point injection - L5 paraspinal muscle. Due to have ESI later this month.  With increased leg pain now.  Cold foot.  Will pursue vascular w/up.  May need repeat MRI if unrevealing.

## 2017-05-16 NOTE — Telephone Encounter (Signed)
Copied from [place] [phone]. Topic: Referral - Request >> May 16, 2017  9:28 AM Arletha Grippe wrote: Reason for CRM: melinda from cardiovascular needs an order for ABI -  Cb is [phone]  A referral was put in, but they are saying that they need an order for a test

## 2017-05-17 ENCOUNTER — Ambulatory Visit (HOSPITAL_COMMUNITY)
Admission: RE | Admit: 2017-05-17 | Discharge: 2017-05-17 | Disposition: A | Discharge status: Home / Self Care | Payer: Medicare HMO | Source: Ambulatory Visit | Attending: Vascular Surgery | Admitting: Vascular Surgery

## 2017-05-17 ENCOUNTER — Encounter: Payer: Self-pay | Admitting: Vascular Surgery

## 2017-05-17 ENCOUNTER — Ambulatory Visit: Payer: Medicare HMO | Admitting: Vascular Surgery

## 2017-05-17 ENCOUNTER — Other Ambulatory Visit: Payer: Self-pay

## 2017-05-17 VITALS — BP 151/61 | HR 60 | Temp 98.6°F | Resp 18 | Ht 61.0 in | Wt 140.3 lb

## 2017-05-17 DIAGNOSIS — I739 Peripheral vascular disease, unspecified: Secondary | ICD-10-CM | POA: Diagnosis not present

## 2017-05-17 DIAGNOSIS — I779 Disorder of arteries and arterioles, unspecified: Secondary | ICD-10-CM | POA: Diagnosis not present

## 2017-05-17 DIAGNOSIS — R209 Unspecified disturbances of skin sensation: Secondary | ICD-10-CM | POA: Diagnosis not present

## 2017-05-17 NOTE — Progress Notes (Signed)
Patient name: [name] MRN: [id number] DOB: [birth year] Sex: female   REASON FOR CONSULT:    Cold right foot the consult is requested by Dr. [name].  HPI:   [name] is a pleasant 84 y.o. female,  who presents for evaluation of a cold right foot.  This patient was having some back pain and was being worked up for this.  Recently her husband noted that the right foot was cool. I reviewed the records from Dr. Bary Leriche office.  The patient was seen yesterday and was noted to have no pedal pulse in the right with a cold right foot.  The patient was added on for vascular consultation.  I reviewed the note from 05/15/2017.  Patient has a history of low back pain and right hip pain.   On my history, she denies any history of claudication, rest pain, or nonhealing ulcers.  She did notice some sharp pain in her right hip back in March and then subsequently had some pain shooting down her right leg.  This pain resolved.  She has no history of arrhythmias or atrial fibrillation.  She has no obvious source for embolization.  Currently however she has no claudication or rest pain.  Her risk factors for peripheral vascular disease include hypertension and hypercholesterolemia.  She denies any history of diabetes, family history of premature cardiovascular disease, or history of tobacco use.  Past Medical History:  Diagnosis Date  . Anemia   . Barrett's esophagus   . Bowel obstruction (HCC)    s/p adhesion resection  . Chronic back pain   . Colon cancer ([place]) 1988 and 1989   adenocarcinoma, s/p resection x  2 and chemo  . Diverticulitis   . GERD (gastroesophageal reflux disease)   . H/O open leg wound   . Hiatal hernia   . Hypercholesteremia   . Hypertension   . Lumbar scoliosis   . OA (osteoarthritis)   . Peripheral neuropathy   . Recurrent sinus infections   . Renal cyst   . S/P chemotherapy, time since greater than 12 weeks    colon cancer  . Vertigo     Family History    Problem Relation Age of Onset  . Breast cancer Mother   . Asthma Mother   . Stroke Father   . Hypertension Father   . Diabetes Father   . Ovarian cancer Sister        x2  . Prostate cancer Brother   . Hypertension Brother        x3  . Heart disease Brother   . Hypercholesterolemia Brother        x3  . Diabetes Brother   . Spina bifida Grandchild   . Hematuria Neg Hx   . Kidney cancer Neg Hx   . Kidney disease Neg Hx   . Sickle cell trait Neg Hx   . Tuberculosis Neg Hx     SOCIAL HISTORY: Social History   Socioeconomic History  . Marital status: Married    Spouse name: Not on file  . Number of children: 4  . Years of education: 12th grade  . Highest education level: Not on file  Occupational History  . Occupation: homemaker  Social Needs  . Financial resource strain: Not hard at all  . Food insecurity:    Worry: Never true    Inability: Never true  . Transportation needs:    Medical: No    Non-medical: No  Tobacco Use  .  Smoking status: Never Smoker  . Smokeless tobacco: Never Used  Substance and Sexual Activity  . Alcohol use: No    Alcohol/week: 0.0 oz  . Drug use: No  . Sexual activity: Never  Lifestyle  . Physical activity:    Days per week: Not on file    Minutes per session: Not on file  . Stress: Not at all  Relationships  . Social connections:    Talks on phone: Not on file    Gets together: Not on file    Attends religious service: Not on file    Active member of club or organization: Not on file    Attends meetings of clubs or organizations: Not on file    Relationship status: Not on file  . Intimate partner violence:    Fear of current or ex partner: No    Emotionally abused: No    Physically abused: No    Forced sexual activity: No  Other Topics Concern  . Not on file  Social History Narrative  . Not on file    Allergies  Allergen Reactions  . Fentanyl Other (See Comments)    confusion    Current Outpatient Medications   Medication Sig Dispense Refill  . amLODipine (NORVASC) 10 MG tablet TAKE 1 TABLET EVERY DAY 90 tablet 1  . Calcium Carbonate-Vitamin D (CALCIUM 600 + D PO) Take 600 Units by mouth 2 (two) times daily with a meal.     . fexofenadine (ALLEGRA) 60 MG tablet Take 1 tablet (60 mg total) by mouth daily. 90 tablet 3  . irbesartan (AVAPRO) 75 MG tablet TAKE 1 TABLET (75 MG TOTAL) DAILY BY MOUTH. 30 tablet 1  . meclizine (ANTIVERT) 25 MG tablet Take 25 mg by mouth 3 (three) times daily as needed.    . meloxicam (MOBIC) 7.5 MG tablet Take 7.5 mg by mouth daily.    . metoprolol succinate (TOPROL-XL) 25 MG 24 hr tablet TAKE 1 TABLET (25 MG TOTAL) BY MOUTH DAILY. 90 tablet 3  . ondansetron (ZOFRAN) 4 MG tablet Take 1 tablet (4 mg total) by mouth 2 (two) times daily as needed for nausea or vomiting. 30 tablet 0  . oxyCODONE (OXY IR/ROXICODONE) 5 MG immediate release tablet Take 5 mg by mouth 3 (three) times daily.     . pantoprazole (PROTONIX) 40 MG tablet Take 40 mg by mouth 2 (two) times daily.    . simvastatin (ZOCOR) 10 MG tablet TAKE 1 TABLET (10 MG TOTAL) BY MOUTH AT BEDTIME. 90 tablet 3  . traMADol-acetaminophen (ULTRACET) 37.5-325 MG per tablet Take 1 tablet by mouth 2 (two) times daily as needed.      No current facility-administered medications for this visit.     REVIEW OF SYSTEMS:  [X]  denotes positive finding, [ ]  denotes negative finding Cardiac  Comments:  Chest pain or chest pressure:    Shortness of breath upon exertion:    Short of breath when lying flat:    Irregular heart rhythm:        Vascular    Pain in calf, thigh, or hip brought on by ambulation: x   Pain in feet at night that wakes you up from your sleep:     Blood clot in your veins:    Leg swelling:  x       Pulmonary    Oxygen at home:    Productive cough:     Wheezing:         Neurologic  Sudden weakness in arms or legs:     Sudden numbness in arms or legs:     Sudden onset of difficulty speaking or slurred  speech:    Temporary loss of vision in one eye:     Problems with dizziness:         Gastrointestinal    Blood in stool:     Vomited blood:         Genitourinary    Burning when urinating:     Blood in urine:        Psychiatric    Major depression:         Hematologic    Bleeding problems:    Problems with blood clotting too easily:        Skin    Rashes or ulcers:        Constitutional    Fever or chills:     PHYSICAL EXAM:   Vitals:   05/17/17 1437 05/17/17 1441  BP: (!) 151/59 (!) 151/61  Pulse: 60   Resp: 18   Temp: 98.6 F (37 C)   TempSrc: Oral   SpO2: 96%   Weight: 140 lb 4.8 oz (63.6 kg)   Height: 5\' 1"  (1.549 m)     GENERAL: The patient is a well-nourished female, in no acute distress. The vital signs are documented above. CARDIAC: There is a regular rate and rhythm.  VASCULAR: I do not detect carotid bruits. She has palpable femoral pulses bilaterally. I cannot palpate pedal pulses. The right foot is cooler than the left foot. The right foot is not mottled.  PULMONARY: There is good air exchange bilaterally without wheezing or rales. ABDOMEN: Soft and non-tender with normal pitched bowel sounds.  I do not palpate an abdominal aortic aneurysm. MUSCULOSKELETAL: There are no major deformities or cyanosis. NEUROLOGIC: No focal weakness or paresthesias are detected. SKIN: There are no ulcers or rashes noted. PSYCHIATRIC: The patient has a normal affect.  DATA:    RIGHT LOWER EXTREMITY VENOUS DUPLEX: I reviewed the results of the right lower extremity venous duplex scan that was done on 05/12/2017.  This showed no evidence of DVT to right lower extremity.  ARTERIAL DOPPLER STUDY: I have independently interpreted the arterial Doppler study that was done today.  On the right side there is a monophasic anterior tibial and posterior tibial signal.  ABI on the right is 38%.  Toe pressure on the right is 18 mmHg.  On the left side there is a triphasic  dorsalis pedis and posterior tibial signal.  ABI is 100%.  Toe pressure on the left is 131 mmHg  MEDICAL ISSUES:   PERIPHERAL VASCULAR DISEASE: Based on her exam the patient does have infrainguinal arterial occlusive disease on the right.  She has a normal ABI on the left side with an ABI of 38% of the right.  She has a palpable femoral pulse.  Currently she is asymptomatic.  Given that she is 84 years old I would be hesitant to recommend an aggressive approach.  However, the symptoms that she described in March where she developed some sudden hip pain on the right and subsequent radiation of the pain down her leg makes me somewhat suspicious that this could be vascular related.  We discussed potentially proceeding with arteriography and the risks involved with this.  Currently she would like to wait on this and I think this is reasonable given that she is asymptomatic.  I would like to follow her foot closely.  Certainly if there is any change in the color of her foot or she develops a wound we would need to proceed with arteriography to further evaluate her peripheral vascular disease.  Fortunately she is not a smoker.  I encouraged her to stay as active as possible.  I encouraged her to take good care of her foot so she does not developed a wound in which case this would become a limb threatening problem.  I will see her back in 6 weeks.  She knows to call sooner if she has problems.  Deitra Mayo [facility] [phone]

## 2017-05-19 ENCOUNTER — Telehealth: Payer: Self-pay

## 2017-05-19 NOTE — Telephone Encounter (Signed)
Called pt back she wanted to know if Dr. Nicki Reaper heard anything from Dr. Bobette Mo and pt wanted a call back from Dr. Nicki Reaper.. Advised pt that Dr. Nicki Reaper was out of the office till Tuesday

## 2017-05-19 NOTE — Telephone Encounter (Signed)
Copied from [place]. Topic: Quick Communication - See Telephone Encounter >> May 19, 2017  2:58 PM Hewitt Shorts wrote: CRM for notification. See Telephone encounter for: 05/19/17. Pt is needing to talk with Lujean Amel she states that it was [employer] number [phone]

## 2017-05-22 NOTE — Telephone Encounter (Signed)
Reviewed phone note.  Please call and notify pt that I am not in the office but did receive a note from Dr Bobette Mo (in [place]).  Per review of note, he had discussed further evaluation (i.e., arteriography).  It was decided to follow her at this point.  Follow for any development of a wound, etc (on her foot).  He recommended f/u in 6 weeks.

## 2017-05-22 NOTE — Telephone Encounter (Signed)
Patient stated all she wanted to know ws that PCP was aware of everything that is going on with her, and she was very grateful to hear PCP had read note.FYI

## 2017-05-23 DIAGNOSIS — M5416 Radiculopathy, lumbar region: Secondary | ICD-10-CM | POA: Diagnosis not present

## 2017-05-23 DIAGNOSIS — M5136 Other intervertebral disc degeneration, lumbar region: Secondary | ICD-10-CM | POA: Diagnosis not present

## 2017-05-23 DIAGNOSIS — M48062 Spinal stenosis, lumbar region with neurogenic claudication: Secondary | ICD-10-CM | POA: Diagnosis not present

## 2017-05-30 ENCOUNTER — Other Ambulatory Visit (INDEPENDENT_AMBULATORY_CARE_PROVIDER_SITE_OTHER): Payer: Medicare HMO

## 2017-05-30 DIAGNOSIS — E7800 Pure hypercholesterolemia, unspecified: Secondary | ICD-10-CM | POA: Diagnosis not present

## 2017-05-30 DIAGNOSIS — R739 Hyperglycemia, unspecified: Secondary | ICD-10-CM

## 2017-05-30 DIAGNOSIS — I10 Essential (primary) hypertension: Secondary | ICD-10-CM | POA: Diagnosis not present

## 2017-05-30 LAB — LIPID PANEL
CHOLESTEROL: 153 mg/dL (ref 0–200)
HDL: 69.2 mg/dL (ref >39.00)
LDL CALC: 59 mg/dL (ref 0–99)
NonHDL: 83.71
TRIGLYCERIDES: 123 mg/dL (ref 0.0–149.0)
Total CHOL/HDL Ratio: 2
VLDL: 24.6 mg/dL (ref 0.0–40.0)

## 2017-05-30 LAB — HEPATIC FUNCTION PANEL
ALBUMIN: 4.1 g/dL (ref 3.5–5.2)
ALK PHOS: 51 U/L (ref 39–117)
ALT: 15 U/L (ref 0–35)
AST: 18 U/L (ref 0–37)
BILIRUBIN DIRECT: 0.2 mg/dL (ref 0.0–0.3)
TOTAL PROTEIN: 6.7 g/dL (ref 6.0–8.3)
Total Bilirubin: 0.9 mg/dL (ref 0.2–1.2)

## 2017-05-30 LAB — BASIC METABOLIC PANEL
BUN: 27 mg/dL — ABNORMAL HIGH (ref 6–23)
CALCIUM: 10.2 mg/dL (ref 8.4–10.5)
CO2: 31 meq/L (ref 19–32)
Chloride: 102 mEq/L (ref 96–112)
Creatinine, Ser: 0.9 mg/dL (ref 0.40–1.20)
GFR: 63.05 mL/min (ref >60.00)
Glucose, Bld: 95 mg/dL (ref 70–99)
POTASSIUM: 4.8 meq/L (ref 3.5–5.1)
SODIUM: 137 meq/L (ref 135–145)

## 2017-05-30 LAB — HEMOGLOBIN A1C: Hgb A1c MFr Bld: 5.6 % (ref 4.6–6.5)

## 2017-06-01 ENCOUNTER — Ambulatory Visit (INDEPENDENT_AMBULATORY_CARE_PROVIDER_SITE_OTHER): Payer: Medicare HMO | Admitting: Internal Medicine

## 2017-06-01 DIAGNOSIS — R209 Unspecified disturbances of skin sensation: Secondary | ICD-10-CM | POA: Diagnosis not present

## 2017-06-01 DIAGNOSIS — D509 Iron deficiency anemia, unspecified: Secondary | ICD-10-CM | POA: Diagnosis not present

## 2017-06-01 DIAGNOSIS — R600 Localized edema: Secondary | ICD-10-CM | POA: Diagnosis not present

## 2017-06-01 DIAGNOSIS — K22719 Barrett's esophagus with dysplasia, unspecified: Secondary | ICD-10-CM | POA: Diagnosis not present

## 2017-06-01 DIAGNOSIS — I10 Essential (primary) hypertension: Secondary | ICD-10-CM

## 2017-06-01 DIAGNOSIS — E7800 Pure hypercholesterolemia, unspecified: Secondary | ICD-10-CM

## 2017-06-01 DIAGNOSIS — M545 Low back pain: Secondary | ICD-10-CM

## 2017-06-01 DIAGNOSIS — G8929 Other chronic pain: Secondary | ICD-10-CM

## 2017-06-01 MED ORDER — MIRABEGRON ER 25 MG PO TB24: 25.0000 mg | ORAL_TABLET | Freq: Every day | ORAL | 1 refills | Status: DC

## 2017-06-01 NOTE — Progress Notes (Signed)
Patient ID: HAZELL SIWIK, female   DOB: 1928/05/29, 84 y.o.   MRN: [id number]   Subjective:    Patient ID: [name], female    DOB: 1928/10/14, 84 y.o.   MRN: [id number]  HPI  Patient here for a scheduled follow up.  She is accompanied by her husband and her daughter.  History obtained from all of them.  She is doing better.  Feels better.  Has chronic back/hip and leg pain.  Sees Dr Sharlet Salina.  S/p injection recently - right L4-5 transforaminal epidural injection.  Pain is some better.  Leg feels better.  Foot not as cold now.  She saw vascular surgery.  Elected to follow.  Recommended f/u 6 weeks after appt.  States blood pressures averaging 124-130/60-70s.  No chest pain.  Breathing stable.  Uses her walker to ambulate.  No abdominal pain.  Bowels moving.     Past Medical History:  Diagnosis Date  . Anemia   . Barrett's esophagus   . Bowel obstruction (HCC)    s/p adhesion resection  . Chronic back pain   . Colon cancer ([place]) 1988 and 1989   adenocarcinoma, s/p resection x  2 and chemo  . Diverticulitis   . GERD (gastroesophageal reflux disease)   . H/O open leg wound   . Hiatal hernia   . Hypercholesteremia   . Hypertension   . Lumbar scoliosis   . OA (osteoarthritis)   . Peripheral neuropathy   . Recurrent sinus infections   . Renal cyst   . S/P chemotherapy, time since greater than 12 weeks    colon cancer  . Vertigo    Past Surgical History:  Procedure Laterality Date  . ABDOMINAL HYSTERECTOMY  1982  . adhesions resected     bowel obstruction  . APPENDECTOMY    . BREAST BIOPSY Left    negative 06/14/1985  . CHOLECYSTECTOMY  1994  . COLON RESECTION     x2.  s/p colon cancer  . [place]  . EXCISIONAL HEMORRHOIDECTOMY     with tubal ligation  . EXPLORATORY LAPAROTOMY  1991   Secondary to SBO   Family History  Problem Relation Age of Onset  . Breast cancer Mother   . Asthma Mother   . Stroke Father   . Hypertension  Father   . Diabetes Father   . Ovarian cancer Sister        x2  . Prostate cancer Brother   . Hypertension Brother        x3  . Heart disease Brother   . Hypercholesterolemia Brother        x3  . Diabetes Brother   . Spina bifida Grandchild   . Hematuria Neg Hx   . Kidney cancer Neg Hx   . Kidney disease Neg Hx   . Sickle cell trait Neg Hx   . Tuberculosis Neg Hx    Social History   Socioeconomic History  . Marital status: Married    Spouse name: Not on file  . Number of children: 4  . Years of education: 12th grade  . Highest education level: Not on file  Occupational History  . Occupation: homemaker  Social Needs  . Financial resource strain: Not hard at all  . Food insecurity:    Worry: Never true    Inability: Never true  . Transportation needs:    Medical: No    Non-medical: No  Tobacco Use  .  Smoking status: Never Smoker  . Smokeless tobacco: Never Used  Substance and Sexual Activity  . Alcohol use: No    Alcohol/week: 0.0 oz  . Drug use: No  . Sexual activity: Never  Lifestyle  . Physical activity:    Days per week: Not on file    Minutes per session: Not on file  . Stress: Not at all  Relationships  . Social connections:    Talks on phone: Not on file    Gets together: Not on file    Attends religious service: Not on file    Active member of club or organization: Not on file    Attends meetings of clubs or organizations: Not on file    Relationship status: Not on file  Other Topics Concern  . Not on file  Social History Narrative  . Not on file    Outpatient Encounter Medications as of 06/01/2017  Medication Sig  . amLODipine (NORVASC) 10 MG tablet TAKE 1 TABLET EVERY DAY  . Calcium Carbonate-Vitamin D (CALCIUM 600 + D PO) Take 600 Units by mouth 2 (two) times daily with a meal.   . fexofenadine (ALLEGRA) 60 MG tablet Take 1 tablet (60 mg total) by mouth daily.  . irbesartan (AVAPRO) 75 MG tablet TAKE 1 TABLET (75 MG TOTAL) DAILY BY MOUTH.    . meclizine (ANTIVERT) 25 MG tablet Take 25 mg by mouth 3 (three) times daily as needed.  . meloxicam (MOBIC) 7.5 MG tablet Take 7.5 mg by mouth daily.  . metoprolol succinate (TOPROL-XL) 25 MG 24 hr tablet TAKE 1 TABLET (25 MG TOTAL) BY MOUTH DAILY.  . mirabegron ER (MYRBETRIQ) 25 MG TB24 tablet Take 1 tablet (25 mg total) by mouth daily.  . ondansetron (ZOFRAN) 4 MG tablet Take 1 tablet (4 mg total) by mouth 2 (two) times daily as needed for nausea or vomiting.  Marland Kitchen oxyCODONE (OXY IR/ROXICODONE) 5 MG immediate release tablet Take 5 mg by mouth 3 (three) times daily.   . pantoprazole (PROTONIX) 40 MG tablet Take 40 mg by mouth 2 (two) times daily.  . simvastatin (ZOCOR) 10 MG tablet TAKE 1 TABLET (10 MG TOTAL) BY MOUTH AT BEDTIME.  . traMADol-acetaminophen (ULTRACET) 37.5-325 MG per tablet Take 1 tablet by mouth 2 (two) times daily as needed.    No facility-administered encounter medications on file as of 06/01/2017.     Review of Systems  Constitutional: Negative for appetite change and unexpected weight change.  HENT: Negative for congestion and sinus pressure.   Respiratory: Negative for cough, chest tightness and shortness of breath.   Cardiovascular: Negative for chest pain, palpitations and leg swelling.  Gastrointestinal: Negative for abdominal pain, diarrhea, nausea and vomiting.  Genitourinary: Negative for difficulty urinating and dysuria.  Musculoskeletal: Positive for back pain. Negative for joint swelling and myalgias.  Skin: Negative for color change and rash.  Neurological: Negative for dizziness, light-headedness and headaches.  Psychiatric/Behavioral: Negative for agitation and dysphoric mood.       Objective:    Physical Exam  Constitutional: She appears well-developed and well-nourished. No distress.  HENT:  Nose: Nose normal.  Mouth/Throat: Oropharynx is clear and moist.  Neck: Neck supple. No thyromegaly present.  Cardiovascular: Normal rate and regular rhythm.   Pulmonary/Chest: Breath sounds normal. No respiratory distress. She has no wheezes.  Abdominal: Soft. Bowel sounds are normal. There is no tenderness.  Musculoskeletal: She exhibits no edema or tenderness.  Lymphadenopathy:    She has no cervical adenopathy.  Skin:  No rash noted. No erythema.  Psychiatric: She has a normal mood and affect. Her behavior is normal.    BP 124/68 (BP Location: Left Arm, Patient Position: Sitting, Cuff Size: Normal)   Pulse 60   Temp 98.3 F (36.8 C) (Oral)   Resp 16   Wt 142 lb 9.6 oz (64.7 kg)   SpO2 95%   BMI 26.94 kg/m  Wt Readings from Last 3 Encounters:  06/01/17 142 lb 9.6 oz (64.7 kg)  05/17/17 140 lb 4.8 oz (63.6 kg)  05/15/17 141 lb 12.8 oz (64.3 kg)     Lab Results  Component Value Date   WBC 7.0 10/13/2016   HGB 13.8 10/13/2016   HCT 40.8 10/13/2016   PLT 320.0 10/13/2016   GLUCOSE 95 05/30/2017   CHOL 153 05/30/2017   TRIG 123.0 05/30/2017   HDL 69.20 05/30/2017   LDLCALC 59 05/30/2017   ALT 15 05/30/2017   AST 18 05/30/2017   NA 137 05/30/2017   K 4.8 05/30/2017   CL 102 05/30/2017   CREATININE 0.90 05/30/2017   BUN 27 (H) 05/30/2017   CO2 31 05/30/2017   TSH 1.84 10/13/2016   INR 0.91 06/11/2015   HGBA1C 5.6 05/30/2017       Assessment & Plan:   Problem List Items Addressed This Visit    Anemia, iron deficiency    Follow cbc.        Barrett's esophagus    Followed by GI.  No upper symptoms.  On protonix.       Bilateral lower extremity edema    Doing well with compression hose.  Follow.        Chronic back pain    Followed by Dr Sharlet Salina.  S/p transforaminal injection recently.  Pain is some better.  Follow.        Cold foot    Saw vascular surgery.  Decreased flot (38%) right leg.  Ok left.  Elected to follow.  Recommended f/u 6 weeks after appt.  Foot better.       Hereditary hemochromatosis ([place])    Followed by hematology.        Hyperbilirubinemia    Follow liver panel.        Hypercholesteremia    On simvastatin.  Low cholesterol diet and exercise.  Follow lipid panel and liver function tests.        Hypertension     Blood pressure doing better.  Continue same medication regimen.  Follow pressures.  Follow metabolic panel.            [name], MD

## 2017-06-04 ENCOUNTER — Encounter: Payer: Self-pay | Admitting: Internal Medicine

## 2017-06-04 NOTE — Assessment & Plan Note (Signed)
Doing well with compression hose.  Follow.  

## 2017-06-04 NOTE — Assessment & Plan Note (Signed)
Follow liver panel.  

## 2017-06-04 NOTE — Assessment & Plan Note (Signed)
Followed by Dr Sharlet Salina.  S/p transforaminal injection recently.  Pain is some better.  Follow.

## 2017-06-04 NOTE — Assessment & Plan Note (Signed)
Follow cbc.  

## 2017-06-04 NOTE — Assessment & Plan Note (Signed)
Followed by GI.  No upper symptoms.  On protonix.   

## 2017-06-04 NOTE — Assessment & Plan Note (Signed)
On simvastatin.  Low cholesterol diet and exercise.  Follow lipid panel and liver function tests.   

## 2017-06-04 NOTE — Assessment & Plan Note (Signed)
Blood pressure doing better.  Continue same medication regimen.  Follow pressures.  Follow metabolic panel.   

## 2017-06-04 NOTE — Assessment & Plan Note (Signed)
Followed by hematology 

## 2017-06-04 NOTE — Assessment & Plan Note (Signed)
Saw vascular surgery.  Decreased flot (38%) right leg.  Ok left.  Elected to follow.  Recommended f/u 6 weeks after appt.  Foot better.

## 2017-06-15 ENCOUNTER — Other Ambulatory Visit: Payer: Self-pay | Admitting: Internal Medicine

## 2017-06-19 DIAGNOSIS — M5416 Radiculopathy, lumbar region: Secondary | ICD-10-CM | POA: Diagnosis not present

## 2017-06-19 DIAGNOSIS — M62830 Muscle spasm of back: Secondary | ICD-10-CM | POA: Diagnosis not present

## 2017-06-19 DIAGNOSIS — M7062 Trochanteric bursitis, left hip: Secondary | ICD-10-CM | POA: Diagnosis not present

## 2017-06-19 DIAGNOSIS — M5136 Other intervertebral disc degeneration, lumbar region: Secondary | ICD-10-CM | POA: Diagnosis not present

## 2017-06-22 ENCOUNTER — Telehealth: Payer: Self-pay | Admitting: *Deleted

## 2017-06-22 NOTE — Telephone Encounter (Signed)
Copied from [place] [phone] [phone]. Topic: General - Other >> Jun 22, 2017  1:17 PM Margot Ables wrote: Reason for CRM: received the referral for services and will go out on Saturday 06/24/17.

## 2017-06-22 NOTE — Telephone Encounter (Signed)
This was just an FYI.

## 2017-06-22 NOTE — Telephone Encounter (Signed)
Do I need to do anything with this?

## 2017-06-24 DIAGNOSIS — K2270 Barrett's esophagus without dysplasia: Secondary | ICD-10-CM | POA: Diagnosis not present

## 2017-06-24 DIAGNOSIS — I10 Essential (primary) hypertension: Secondary | ICD-10-CM | POA: Diagnosis not present

## 2017-06-24 DIAGNOSIS — M5416 Radiculopathy, lumbar region: Secondary | ICD-10-CM | POA: Diagnosis not present

## 2017-06-24 DIAGNOSIS — M5136 Other intervertebral disc degeneration, lumbar region: Secondary | ICD-10-CM | POA: Diagnosis not present

## 2017-06-24 DIAGNOSIS — M7061 Trochanteric bursitis, right hip: Secondary | ICD-10-CM | POA: Diagnosis not present

## 2017-06-24 DIAGNOSIS — M7062 Trochanteric bursitis, left hip: Secondary | ICD-10-CM | POA: Diagnosis not present

## 2017-06-26 DIAGNOSIS — M7062 Trochanteric bursitis, left hip: Secondary | ICD-10-CM | POA: Diagnosis not present

## 2017-06-26 DIAGNOSIS — M5136 Other intervertebral disc degeneration, lumbar region: Secondary | ICD-10-CM | POA: Diagnosis not present

## 2017-06-26 DIAGNOSIS — I10 Essential (primary) hypertension: Secondary | ICD-10-CM | POA: Diagnosis not present

## 2017-06-26 DIAGNOSIS — M7061 Trochanteric bursitis, right hip: Secondary | ICD-10-CM | POA: Diagnosis not present

## 2017-06-26 DIAGNOSIS — M5416 Radiculopathy, lumbar region: Secondary | ICD-10-CM | POA: Diagnosis not present

## 2017-06-26 DIAGNOSIS — K2270 Barrett's esophagus without dysplasia: Secondary | ICD-10-CM | POA: Diagnosis not present

## 2017-06-27 DIAGNOSIS — I10 Essential (primary) hypertension: Secondary | ICD-10-CM | POA: Diagnosis not present

## 2017-06-27 DIAGNOSIS — K2270 Barrett's esophagus without dysplasia: Secondary | ICD-10-CM | POA: Diagnosis not present

## 2017-06-27 DIAGNOSIS — M7062 Trochanteric bursitis, left hip: Secondary | ICD-10-CM | POA: Diagnosis not present

## 2017-06-27 DIAGNOSIS — M7061 Trochanteric bursitis, right hip: Secondary | ICD-10-CM | POA: Diagnosis not present

## 2017-06-27 DIAGNOSIS — M5136 Other intervertebral disc degeneration, lumbar region: Secondary | ICD-10-CM | POA: Diagnosis not present

## 2017-06-27 DIAGNOSIS — M5416 Radiculopathy, lumbar region: Secondary | ICD-10-CM | POA: Diagnosis not present

## 2017-06-29 DIAGNOSIS — I10 Essential (primary) hypertension: Secondary | ICD-10-CM | POA: Diagnosis not present

## 2017-06-29 DIAGNOSIS — M7061 Trochanteric bursitis, right hip: Secondary | ICD-10-CM | POA: Diagnosis not present

## 2017-06-29 DIAGNOSIS — M5416 Radiculopathy, lumbar region: Secondary | ICD-10-CM | POA: Diagnosis not present

## 2017-06-29 DIAGNOSIS — K2270 Barrett's esophagus without dysplasia: Secondary | ICD-10-CM | POA: Diagnosis not present

## 2017-06-29 DIAGNOSIS — M7062 Trochanteric bursitis, left hip: Secondary | ICD-10-CM | POA: Diagnosis not present

## 2017-06-29 DIAGNOSIS — M5136 Other intervertebral disc degeneration, lumbar region: Secondary | ICD-10-CM | POA: Diagnosis not present

## 2017-07-03 DIAGNOSIS — M5136 Other intervertebral disc degeneration, lumbar region: Secondary | ICD-10-CM | POA: Diagnosis not present

## 2017-07-03 DIAGNOSIS — I10 Essential (primary) hypertension: Secondary | ICD-10-CM | POA: Diagnosis not present

## 2017-07-03 DIAGNOSIS — M7062 Trochanteric bursitis, left hip: Secondary | ICD-10-CM | POA: Diagnosis not present

## 2017-07-03 DIAGNOSIS — M7061 Trochanteric bursitis, right hip: Secondary | ICD-10-CM | POA: Diagnosis not present

## 2017-07-03 DIAGNOSIS — M5416 Radiculopathy, lumbar region: Secondary | ICD-10-CM | POA: Diagnosis not present

## 2017-07-03 DIAGNOSIS — K2270 Barrett's esophagus without dysplasia: Secondary | ICD-10-CM | POA: Diagnosis not present

## 2017-07-04 DIAGNOSIS — M5416 Radiculopathy, lumbar region: Secondary | ICD-10-CM | POA: Diagnosis not present

## 2017-07-04 DIAGNOSIS — I10 Essential (primary) hypertension: Secondary | ICD-10-CM | POA: Diagnosis not present

## 2017-07-04 DIAGNOSIS — M7061 Trochanteric bursitis, right hip: Secondary | ICD-10-CM | POA: Diagnosis not present

## 2017-07-04 DIAGNOSIS — M5136 Other intervertebral disc degeneration, lumbar region: Secondary | ICD-10-CM | POA: Diagnosis not present

## 2017-07-04 DIAGNOSIS — K2270 Barrett's esophagus without dysplasia: Secondary | ICD-10-CM | POA: Diagnosis not present

## 2017-07-04 DIAGNOSIS — M7062 Trochanteric bursitis, left hip: Secondary | ICD-10-CM | POA: Diagnosis not present

## 2017-07-05 ENCOUNTER — Other Ambulatory Visit: Payer: Self-pay

## 2017-07-05 ENCOUNTER — Encounter: Payer: Self-pay | Admitting: Vascular Surgery

## 2017-07-05 ENCOUNTER — Ambulatory Visit: Payer: Medicare HMO | Admitting: Vascular Surgery

## 2017-07-05 VITALS — BP 146/61 | HR 58 | Temp 98.6°F | Resp 16 | Ht 63.0 in | Wt 142.0 lb

## 2017-07-05 DIAGNOSIS — I739 Peripheral vascular disease, unspecified: Secondary | ICD-10-CM

## 2017-07-05 NOTE — Progress Notes (Signed)
Vitals:   07/05/17 1208  BP: (!) 149/62  Pulse: (!) 58  Resp: 16  Temp: 98.6 F (37 C)  TempSrc: Oral  SpO2: 97%  Weight: 142 lb (64.4 kg)  Height: 5\' 3"  (1.6 m)

## 2017-07-05 NOTE — Progress Notes (Signed)
Patient name: [name] MRN: [id number] DOB: January 26, 1929 Sex: female  REASON FOR VISIT:   Follow-up of peripheral vascular disease.  Was on consultation on 05/17/2017 with a cold right foot.  HPI:   [name] is a pleasant 84 y.o. female who I saw in consultation on 05/17/2017 with a cold right foot.  Arterial Doppler study at that time showed an ABI in the right of 38%.  Toe pressure was 18 mmHg.  She had a normal ABI on the left.  Venous duplex showed no evidence of DVT in the right lower extremity.  Based on her exam I thought she had evidence of infrainguinal arterial occlusive disease on the right.  She had minimal symptoms.  Given her age I was reluctant to consider an aggressive approach.  However I felt that if her symptoms progressed and certainly we could consider arteriography.   She comes in for 6-week follow-up visit.   Since I saw her last, she denies any claudication, rest pain, or nonhealing ulcers.  Current Outpatient Medications  Medication Sig Dispense Refill  . amLODipine (NORVASC) 10 MG tablet TAKE 1 TABLET EVERY DAY 90 tablet 1  . Calcium Carbonate-Vitamin D (CALCIUM 600 + D PO) Take 600 Units by mouth 2 (two) times daily with a meal.     . fexofenadine (ALLEGRA) 60 MG tablet Take 1 tablet (60 mg total) by mouth daily. 90 tablet 3  . irbesartan (AVAPRO) 75 MG tablet TAKE 1 TABLET (75 MG TOTAL) DAILY BY MOUTH. 30 tablet 1  . meclizine (ANTIVERT) 25 MG tablet Take 25 mg by mouth 3 (three) times daily as needed.    . meloxicam (MOBIC) 7.5 MG tablet Take 7.5 mg by mouth daily.    . metoprolol succinate (TOPROL-XL) 25 MG 24 hr tablet TAKE 1 TABLET (25 MG TOTAL) BY MOUTH DAILY. 90 tablet 3  . mirabegron ER (MYRBETRIQ) 25 MG TB24 tablet Take 1 tablet (25 mg total) by mouth daily. 30 tablet 1  . ondansetron (ZOFRAN) 4 MG tablet Take 1 tablet (4 mg total) by mouth 2 (two) times daily as needed for nausea or vomiting. 30 tablet 0  . oxyCODONE (OXY IR/ROXICODONE) 5 MG  immediate release tablet Take 5 mg by mouth 3 (three) times daily.     . pantoprazole (PROTONIX) 40 MG tablet Take 40 mg by mouth 2 (two) times daily.    . simvastatin (ZOCOR) 10 MG tablet TAKE 1 TABLET (10 MG TOTAL) BY MOUTH AT BEDTIME. 90 tablet 3  . traMADol-acetaminophen (ULTRACET) 37.5-325 MG per tablet Take 1 tablet by mouth 2 (two) times daily as needed.      No current facility-administered medications for this visit.     REVIEW OF SYSTEMS:  [X]  denotes positive finding, [ ]  denotes negative finding Cardiac  Comments:  Chest pain or chest pressure:    Shortness of breath upon exertion:    Short of breath when lying flat:    Irregular heart rhythm:    Constitutional    Fever or chills:     PHYSICAL EXAM:   Vitals:   07/05/17 1208 07/05/17 1214  BP: (!) 149/62 (!) 146/61  Pulse: (!) 58 (!) 58  Resp: 16   Temp: 98.6 F (37 C)   TempSrc: Oral   SpO2: 97%   Weight: 142 lb (64.4 kg)   Height: 5\' 3"  (1.6 m)     GENERAL: The patient is a well-nourished female, in no acute distress. The vital signs are documented  above. CARDIOVASCULAR: There is a regular rate and rhythm. PULMONARY: There is good air exchange bilaterally without wheezing or rales. She has palpable femoral pulses. Both feet are warm and appear adequately perfused.  DATA:   No new data.  MEDICAL ISSUES:   PERIPHERAL VASCULAR DISEASE: This patient does have evidence of infrainguinal arterial occlusive disease on the right however she is asymptomatic.  Her activity is fairly limited.  I would not recommend an aggressive approach to include arteriography unless she developed rest pain or nonhealing ulcer.  I have encouraged her to stay as active as possible.  Fortunately she is not a smoker.  She can call at any time if her symptoms progress or her primary care physician can refer her back also.  I will see her back as needed.  Deitra Mayo [facility]  [phone]

## 2017-07-06 DIAGNOSIS — M5136 Other intervertebral disc degeneration, lumbar region: Secondary | ICD-10-CM | POA: Diagnosis not present

## 2017-07-06 DIAGNOSIS — M7061 Trochanteric bursitis, right hip: Secondary | ICD-10-CM | POA: Diagnosis not present

## 2017-07-06 DIAGNOSIS — M7062 Trochanteric bursitis, left hip: Secondary | ICD-10-CM | POA: Diagnosis not present

## 2017-07-06 DIAGNOSIS — K2270 Barrett's esophagus without dysplasia: Secondary | ICD-10-CM | POA: Diagnosis not present

## 2017-07-06 DIAGNOSIS — I10 Essential (primary) hypertension: Secondary | ICD-10-CM | POA: Diagnosis not present

## 2017-07-06 DIAGNOSIS — M5416 Radiculopathy, lumbar region: Secondary | ICD-10-CM | POA: Diagnosis not present

## 2017-07-11 DIAGNOSIS — M5136 Other intervertebral disc degeneration, lumbar region: Secondary | ICD-10-CM | POA: Diagnosis not present

## 2017-07-11 DIAGNOSIS — I10 Essential (primary) hypertension: Secondary | ICD-10-CM | POA: Diagnosis not present

## 2017-07-11 DIAGNOSIS — M5416 Radiculopathy, lumbar region: Secondary | ICD-10-CM | POA: Diagnosis not present

## 2017-07-11 DIAGNOSIS — M7062 Trochanteric bursitis, left hip: Secondary | ICD-10-CM | POA: Diagnosis not present

## 2017-07-11 DIAGNOSIS — K2270 Barrett's esophagus without dysplasia: Secondary | ICD-10-CM | POA: Diagnosis not present

## 2017-07-11 DIAGNOSIS — M7061 Trochanteric bursitis, right hip: Secondary | ICD-10-CM | POA: Diagnosis not present

## 2017-07-12 DIAGNOSIS — M7061 Trochanteric bursitis, right hip: Secondary | ICD-10-CM | POA: Diagnosis not present

## 2017-07-12 DIAGNOSIS — M5136 Other intervertebral disc degeneration, lumbar region: Secondary | ICD-10-CM | POA: Diagnosis not present

## 2017-07-12 DIAGNOSIS — M5416 Radiculopathy, lumbar region: Secondary | ICD-10-CM | POA: Diagnosis not present

## 2017-07-12 DIAGNOSIS — M7062 Trochanteric bursitis, left hip: Secondary | ICD-10-CM | POA: Diagnosis not present

## 2017-07-12 DIAGNOSIS — I10 Essential (primary) hypertension: Secondary | ICD-10-CM | POA: Diagnosis not present

## 2017-07-12 DIAGNOSIS — K2270 Barrett's esophagus without dysplasia: Secondary | ICD-10-CM | POA: Diagnosis not present

## 2017-07-14 DIAGNOSIS — M7061 Trochanteric bursitis, right hip: Secondary | ICD-10-CM | POA: Diagnosis not present

## 2017-07-14 DIAGNOSIS — I10 Essential (primary) hypertension: Secondary | ICD-10-CM | POA: Diagnosis not present

## 2017-07-14 DIAGNOSIS — M5416 Radiculopathy, lumbar region: Secondary | ICD-10-CM | POA: Diagnosis not present

## 2017-07-14 DIAGNOSIS — M5136 Other intervertebral disc degeneration, lumbar region: Secondary | ICD-10-CM | POA: Diagnosis not present

## 2017-07-14 DIAGNOSIS — M7062 Trochanteric bursitis, left hip: Secondary | ICD-10-CM | POA: Diagnosis not present

## 2017-07-14 DIAGNOSIS — K2270 Barrett's esophagus without dysplasia: Secondary | ICD-10-CM | POA: Diagnosis not present

## 2017-07-17 DIAGNOSIS — M7062 Trochanteric bursitis, left hip: Secondary | ICD-10-CM | POA: Diagnosis not present

## 2017-07-17 DIAGNOSIS — M5416 Radiculopathy, lumbar region: Secondary | ICD-10-CM | POA: Diagnosis not present

## 2017-07-17 DIAGNOSIS — M7061 Trochanteric bursitis, right hip: Secondary | ICD-10-CM | POA: Diagnosis not present

## 2017-07-17 DIAGNOSIS — M5136 Other intervertebral disc degeneration, lumbar region: Secondary | ICD-10-CM | POA: Diagnosis not present

## 2017-07-17 DIAGNOSIS — I10 Essential (primary) hypertension: Secondary | ICD-10-CM | POA: Diagnosis not present

## 2017-07-17 DIAGNOSIS — K2270 Barrett's esophagus without dysplasia: Secondary | ICD-10-CM | POA: Diagnosis not present

## 2017-07-19 DIAGNOSIS — M7062 Trochanteric bursitis, left hip: Secondary | ICD-10-CM | POA: Diagnosis not present

## 2017-07-19 DIAGNOSIS — M5136 Other intervertebral disc degeneration, lumbar region: Secondary | ICD-10-CM | POA: Diagnosis not present

## 2017-07-19 DIAGNOSIS — M7061 Trochanteric bursitis, right hip: Secondary | ICD-10-CM | POA: Diagnosis not present

## 2017-07-19 DIAGNOSIS — M5416 Radiculopathy, lumbar region: Secondary | ICD-10-CM | POA: Diagnosis not present

## 2017-07-19 DIAGNOSIS — I10 Essential (primary) hypertension: Secondary | ICD-10-CM | POA: Diagnosis not present

## 2017-07-19 DIAGNOSIS — K2270 Barrett's esophagus without dysplasia: Secondary | ICD-10-CM | POA: Diagnosis not present

## 2017-07-20 DIAGNOSIS — M5416 Radiculopathy, lumbar region: Secondary | ICD-10-CM | POA: Diagnosis not present

## 2017-07-20 DIAGNOSIS — I10 Essential (primary) hypertension: Secondary | ICD-10-CM | POA: Diagnosis not present

## 2017-07-20 DIAGNOSIS — M5136 Other intervertebral disc degeneration, lumbar region: Secondary | ICD-10-CM | POA: Diagnosis not present

## 2017-07-20 DIAGNOSIS — M7062 Trochanteric bursitis, left hip: Secondary | ICD-10-CM | POA: Diagnosis not present

## 2017-07-20 DIAGNOSIS — K2270 Barrett's esophagus without dysplasia: Secondary | ICD-10-CM | POA: Diagnosis not present

## 2017-07-20 DIAGNOSIS — M7061 Trochanteric bursitis, right hip: Secondary | ICD-10-CM | POA: Diagnosis not present

## 2017-07-24 DIAGNOSIS — M5416 Radiculopathy, lumbar region: Secondary | ICD-10-CM | POA: Diagnosis not present

## 2017-07-24 DIAGNOSIS — I10 Essential (primary) hypertension: Secondary | ICD-10-CM | POA: Diagnosis not present

## 2017-07-24 DIAGNOSIS — K2270 Barrett's esophagus without dysplasia: Secondary | ICD-10-CM | POA: Diagnosis not present

## 2017-07-24 DIAGNOSIS — M5136 Other intervertebral disc degeneration, lumbar region: Secondary | ICD-10-CM | POA: Diagnosis not present

## 2017-07-24 DIAGNOSIS — M7062 Trochanteric bursitis, left hip: Secondary | ICD-10-CM | POA: Diagnosis not present

## 2017-07-24 DIAGNOSIS — M7061 Trochanteric bursitis, right hip: Secondary | ICD-10-CM | POA: Diagnosis not present

## 2017-07-26 ENCOUNTER — Other Ambulatory Visit: Payer: Self-pay | Admitting: Internal Medicine

## 2017-07-26 DIAGNOSIS — M7062 Trochanteric bursitis, left hip: Secondary | ICD-10-CM | POA: Diagnosis not present

## 2017-07-26 DIAGNOSIS — M5416 Radiculopathy, lumbar region: Secondary | ICD-10-CM | POA: Diagnosis not present

## 2017-07-26 DIAGNOSIS — I10 Essential (primary) hypertension: Secondary | ICD-10-CM | POA: Diagnosis not present

## 2017-07-26 DIAGNOSIS — K2270 Barrett's esophagus without dysplasia: Secondary | ICD-10-CM | POA: Diagnosis not present

## 2017-07-26 DIAGNOSIS — M5136 Other intervertebral disc degeneration, lumbar region: Secondary | ICD-10-CM | POA: Diagnosis not present

## 2017-07-26 DIAGNOSIS — M7061 Trochanteric bursitis, right hip: Secondary | ICD-10-CM | POA: Diagnosis not present

## 2017-07-27 DIAGNOSIS — M7061 Trochanteric bursitis, right hip: Secondary | ICD-10-CM | POA: Diagnosis not present

## 2017-07-27 DIAGNOSIS — M5136 Other intervertebral disc degeneration, lumbar region: Secondary | ICD-10-CM | POA: Diagnosis not present

## 2017-07-27 DIAGNOSIS — M5416 Radiculopathy, lumbar region: Secondary | ICD-10-CM | POA: Diagnosis not present

## 2017-07-27 DIAGNOSIS — K2270 Barrett's esophagus without dysplasia: Secondary | ICD-10-CM | POA: Diagnosis not present

## 2017-07-27 DIAGNOSIS — I10 Essential (primary) hypertension: Secondary | ICD-10-CM | POA: Diagnosis not present

## 2017-07-27 DIAGNOSIS — M7062 Trochanteric bursitis, left hip: Secondary | ICD-10-CM | POA: Diagnosis not present

## 2017-07-31 DIAGNOSIS — M7061 Trochanteric bursitis, right hip: Secondary | ICD-10-CM | POA: Diagnosis not present

## 2017-07-31 DIAGNOSIS — M5416 Radiculopathy, lumbar region: Secondary | ICD-10-CM | POA: Diagnosis not present

## 2017-07-31 DIAGNOSIS — I10 Essential (primary) hypertension: Secondary | ICD-10-CM | POA: Diagnosis not present

## 2017-07-31 DIAGNOSIS — K2270 Barrett's esophagus without dysplasia: Secondary | ICD-10-CM | POA: Diagnosis not present

## 2017-07-31 DIAGNOSIS — M7062 Trochanteric bursitis, left hip: Secondary | ICD-10-CM | POA: Diagnosis not present

## 2017-07-31 DIAGNOSIS — M5136 Other intervertebral disc degeneration, lumbar region: Secondary | ICD-10-CM | POA: Diagnosis not present

## 2017-08-02 DIAGNOSIS — M5416 Radiculopathy, lumbar region: Secondary | ICD-10-CM | POA: Diagnosis not present

## 2017-08-02 DIAGNOSIS — M7062 Trochanteric bursitis, left hip: Secondary | ICD-10-CM | POA: Diagnosis not present

## 2017-08-02 DIAGNOSIS — R1310 Dysphagia, unspecified: Secondary | ICD-10-CM | POA: Diagnosis not present

## 2017-08-02 DIAGNOSIS — K2270 Barrett's esophagus without dysplasia: Secondary | ICD-10-CM | POA: Diagnosis not present

## 2017-08-02 DIAGNOSIS — M7061 Trochanteric bursitis, right hip: Secondary | ICD-10-CM | POA: Diagnosis not present

## 2017-08-02 DIAGNOSIS — R1013 Epigastric pain: Secondary | ICD-10-CM | POA: Diagnosis not present

## 2017-08-02 DIAGNOSIS — I10 Essential (primary) hypertension: Secondary | ICD-10-CM | POA: Diagnosis not present

## 2017-08-02 DIAGNOSIS — M5136 Other intervertebral disc degeneration, lumbar region: Secondary | ICD-10-CM | POA: Diagnosis not present

## 2017-08-03 DIAGNOSIS — M5416 Radiculopathy, lumbar region: Secondary | ICD-10-CM | POA: Diagnosis not present

## 2017-08-03 DIAGNOSIS — M5136 Other intervertebral disc degeneration, lumbar region: Secondary | ICD-10-CM | POA: Diagnosis not present

## 2017-08-04 DIAGNOSIS — M5416 Radiculopathy, lumbar region: Secondary | ICD-10-CM | POA: Diagnosis not present

## 2017-08-04 DIAGNOSIS — M5136 Other intervertebral disc degeneration, lumbar region: Secondary | ICD-10-CM | POA: Diagnosis not present

## 2017-08-04 DIAGNOSIS — M7062 Trochanteric bursitis, left hip: Secondary | ICD-10-CM | POA: Diagnosis not present

## 2017-08-04 DIAGNOSIS — M7061 Trochanteric bursitis, right hip: Secondary | ICD-10-CM | POA: Diagnosis not present

## 2017-08-04 DIAGNOSIS — K2270 Barrett's esophagus without dysplasia: Secondary | ICD-10-CM | POA: Diagnosis not present

## 2017-08-04 DIAGNOSIS — I10 Essential (primary) hypertension: Secondary | ICD-10-CM | POA: Diagnosis not present

## 2017-08-09 ENCOUNTER — Telehealth: Payer: Self-pay | Admitting: Internal Medicine

## 2017-08-09 ENCOUNTER — Telehealth: Payer: Self-pay

## 2017-08-09 NOTE — Telephone Encounter (Signed)
Pt needs a generic form of MYRBETRIQ 25 MG TB24 tablet sent to [facility].. Pt states that this rx is way to expensive.. Please advise pt if any questions

## 2017-08-09 NOTE — Telephone Encounter (Signed)
Call pt 

## 2017-08-09 NOTE — Telephone Encounter (Signed)
Copied from [place] [id number]. Topic: Inquiry >> Aug 09, 2017 11:25 AM Oliver Pila B wrote: Reason for CRM: pt called to disclose personal information to an Lujean Amel, contact pt to advise when possible

## 2017-08-10 NOTE — Telephone Encounter (Signed)
I did not think Myrbetriq had a generic?

## 2017-08-10 NOTE — Telephone Encounter (Signed)
Need to call pharmacy and clarify what is needed.  Does she need PA?  Does she need to try another medication?  If so, need to know what she has tried previously (I.e., detrol, ditropran, vesicare, etc)

## 2017-08-11 NOTE — Telephone Encounter (Signed)
Relation to pt: self  Call back number: [phone] Pharmacy: [id number] - [place], [place] [address]  Reason for call:  Spouse checking on the status of message below, stating medication is expensive and will put patient in the "donut hole" therefore requesting generic alternate, please advise

## 2017-08-11 NOTE — Telephone Encounter (Signed)
LMTCB

## 2017-08-11 NOTE — Telephone Encounter (Signed)
Please advise 

## 2017-08-14 NOTE — Telephone Encounter (Signed)
Pt husband is returning [place] call

## 2017-08-15 ENCOUNTER — Other Ambulatory Visit: Payer: Self-pay

## 2017-08-15 ENCOUNTER — Telehealth: Payer: Self-pay | Admitting: Internal Medicine

## 2017-08-15 MED ORDER — TOLTERODINE TARTRATE 2 MG PO TABS: 2.0000 mg | ORAL_TABLET | Freq: Two times a day (BID) | ORAL | 0 refills | Status: DC

## 2017-08-15 NOTE — Telephone Encounter (Unsigned)
Copied from [place] [phone]. Topic: Quick Communication - See Telephone Encounter >> Aug 15, 2017  9:19 AM Marja Kays F wrote: Pt is calling to talk with [name] in regards to getting a generic of myrbetriq

## 2017-08-15 NOTE — Telephone Encounter (Signed)
See other phone note

## 2017-08-15 NOTE — Telephone Encounter (Signed)
Spoke with patient. She has not tried another medication but is willing to switch to something that you recommend. Advised patient that if new medication was not effective we could do a PA with her insurance,

## 2017-08-15 NOTE — Telephone Encounter (Signed)
Please advise 

## 2017-08-15 NOTE — Telephone Encounter (Signed)
rx sent in husband aware

## 2017-08-15 NOTE — Telephone Encounter (Signed)
If agreeable, can try detrol 2mg  bid #60 with 1 refill

## 2017-08-16 ENCOUNTER — Other Ambulatory Visit: Payer: Self-pay | Admitting: Internal Medicine

## 2017-08-21 ENCOUNTER — Telehealth: Payer: Self-pay | Admitting: *Deleted

## 2017-08-21 NOTE — Telephone Encounter (Signed)
Copied from [place] [phone]. Topic: Inquiry >> Aug 21, 2017  9:51 AM Conception Chancy, NT wrote: Reason for CRM: patient husband is calling and states he has some questions for Larena Glassman in regards to his wifes medication.

## 2017-08-21 NOTE — Telephone Encounter (Signed)
Patient stated Myrbetriq is putting her in the donut hole Oxybutynin 5 mg or 10 mg is covered by insurance and would like called to [facility].

## 2017-08-22 MED ORDER — OXYBUTYNIN CHLORIDE 5 MG PO TABS: 5.0000 mg | ORAL_TABLET | Freq: Two times a day (BID) | ORAL | 1 refills | Status: DC

## 2017-08-22 NOTE — Telephone Encounter (Signed)
Per previous phone message,we had sent in rx for generic detrol instead of myrbetriq. If insurance will not cover myrbetriq or detrol, can send in rx for ditropan (generic is oxybutynin) 5mg  q day.  #30 with 2 refills.

## 2017-08-22 NOTE — Telephone Encounter (Signed)
Left message for patient to return call to office. 

## 2017-08-22 NOTE — Telephone Encounter (Signed)
Ditropan BID sent to [facility] 90 day supply per verbal from PCP.

## 2017-08-23 ENCOUNTER — Other Ambulatory Visit: Payer: Self-pay

## 2017-08-24 ENCOUNTER — Telehealth: Payer: Self-pay

## 2017-08-24 NOTE — Telephone Encounter (Signed)
Yes

## 2017-08-24 NOTE — Telephone Encounter (Signed)
Copied from [place] [id number]. Topic: General - Other >> Aug 24, 2017 12:25 PM Keene Breath wrote: Reason for CRM: Kern Reap with [facility] called to request the status of medication Myrbetriq 25mg .  CB# [phone].

## 2017-08-25 ENCOUNTER — Other Ambulatory Visit: Payer: Self-pay

## 2017-09-15 ENCOUNTER — Encounter: Payer: Self-pay | Admitting: Internal Medicine

## 2017-09-15 ENCOUNTER — Ambulatory Visit (INDEPENDENT_AMBULATORY_CARE_PROVIDER_SITE_OTHER): Payer: Medicare HMO | Admitting: Internal Medicine

## 2017-09-15 VITALS — BP 120/60 | HR 54 | Temp 98.4°F | Resp 17 | Ht 63.0 in | Wt 144.5 lb

## 2017-09-15 DIAGNOSIS — K22719 Barrett's esophagus with dysplasia, unspecified: Secondary | ICD-10-CM | POA: Diagnosis not present

## 2017-09-15 DIAGNOSIS — R739 Hyperglycemia, unspecified: Secondary | ICD-10-CM | POA: Diagnosis not present

## 2017-09-15 DIAGNOSIS — E7800 Pure hypercholesterolemia, unspecified: Secondary | ICD-10-CM | POA: Diagnosis not present

## 2017-09-15 DIAGNOSIS — Z1231 Encounter for screening mammogram for malignant neoplasm of breast: Secondary | ICD-10-CM

## 2017-09-15 DIAGNOSIS — D509 Iron deficiency anemia, unspecified: Secondary | ICD-10-CM

## 2017-09-15 DIAGNOSIS — R600 Localized edema: Secondary | ICD-10-CM

## 2017-09-15 DIAGNOSIS — M545 Low back pain: Secondary | ICD-10-CM

## 2017-09-15 DIAGNOSIS — I10 Essential (primary) hypertension: Secondary | ICD-10-CM

## 2017-09-15 DIAGNOSIS — G8929 Other chronic pain: Secondary | ICD-10-CM

## 2017-09-15 NOTE — Progress Notes (Signed)
Patient ID: [name], female   DOB: 1928/03/23, 84 y.o.   MRN: [id number]   Subjective:    Patient ID: [name], female    DOB: 12-06-1928, 84 y.o.   MRN: [id number]  HPI  Patient here for a scheduled follow up.  She is accompanied by her husband.  History obtained from both of them.  Brings in blood pressure readings.  Blood pressures averaging 120-140/70s.  No chest pain.  No sob.  No acid reflux.  No abdominal pain.  Bowels moving.  Persistent back pain.  She is scheduled to see Dr Sharlet Salina next week.  She was questioning if muscle relaxer would help with discomfort and sleep.  Plans to discuss with Dr Sharlet Salina.  Overall she feels she is doing relatively well.     Past Medical History:  Diagnosis Date  . Anemia   . Barrett's esophagus   . Bowel obstruction (HCC)    s/p adhesion resection  . Chronic back pain   . Colon cancer ([place]) 1988 and 1989   adenocarcinoma, s/p resection x  2 and chemo  . Diverticulitis   . GERD (gastroesophageal reflux disease)   . H/O open leg wound   . Hiatal hernia   . Hypercholesteremia   . Hypertension   . Lumbar scoliosis   . OA (osteoarthritis)   . Peripheral neuropathy   . Recurrent sinus infections   . Renal cyst   . S/P chemotherapy, time since greater than 12 weeks    colon cancer  . Vertigo    Past Surgical History:  Procedure Laterality Date  . ABDOMINAL HYSTERECTOMY  1982  . adhesions resected     bowel obstruction  . APPENDECTOMY    . BREAST BIOPSY Left    negative 06/14/1985  . CHOLECYSTECTOMY  1994  . COLON RESECTION     x2.  s/p colon cancer  . [place]  . EXCISIONAL HEMORRHOIDECTOMY     with tubal ligation  . EXPLORATORY LAPAROTOMY  1991   Secondary to SBO   Family History  Problem Relation Age of Onset  . Breast cancer Mother   . Asthma Mother   . Stroke Father   . Hypertension Father   . Diabetes Father   . Ovarian cancer Sister        x2  . Prostate cancer Brother    . Hypertension Brother        x3  . Heart disease Brother   . Hypercholesterolemia Brother        x3  . Diabetes Brother   . Spina bifida Grandchild   . Hematuria Neg Hx   . Kidney cancer Neg Hx   . Kidney disease Neg Hx   . Sickle cell trait Neg Hx   . Tuberculosis Neg Hx    Social History   Socioeconomic History  . Marital status: Married    Spouse name: Not on file  . Number of children: 4  . Years of education: 12th grade  . Highest education level: Not on file  Occupational History  . Occupation: homemaker  Social Needs  . Financial resource strain: Not hard at all  . Food insecurity:    Worry: Never true    Inability: Never true  . Transportation needs:    Medical: No    Non-medical: No  Tobacco Use  . Smoking status: Never Smoker  . Smokeless tobacco: Never Used  Substance and Sexual Activity  .  Alcohol use: No    Alcohol/week: 0.0 oz  . Drug use: No  . Sexual activity: Never  Lifestyle  . Physical activity:    Days per week: Not on file    Minutes per session: Not on file  . Stress: Not at all  Relationships  . Social connections:    Talks on phone: Not on file    Gets together: Not on file    Attends religious service: Not on file    Active member of club or organization: Not on file    Attends meetings of clubs or organizations: Not on file    Relationship status: Not on file  Other Topics Concern  . Not on file  Social History Narrative  . Not on file    Outpatient Encounter Medications as of 09/15/2017  Medication Sig  . amLODipine (NORVASC) 10 MG tablet TAKE 1 TABLET EVERY DAY  . Calcium Carbonate-Vitamin D (CALCIUM 600 + D PO) Take 600 Units by mouth 2 (two) times daily with a meal.   . fexofenadine (ALLEGRA) 60 MG tablet Take 1 tablet (60 mg total) by mouth daily.  . irbesartan (AVAPRO) 75 MG tablet TAKE 1 TABLET (75 MG TOTAL) DAILY BY MOUTH.  . meclizine (ANTIVERT) 25 MG tablet Take 25 mg by mouth 3 (three) times daily as needed.  .  meloxicam (MOBIC) 7.5 MG tablet Take 7.5 mg by mouth daily.  . metoprolol succinate (TOPROL-XL) 25 MG 24 hr tablet TAKE 1 TABLET (25 MG TOTAL) BY MOUTH DAILY.  Marland Kitchen ondansetron (ZOFRAN) 4 MG tablet Take 1 tablet (4 mg total) by mouth 2 (two) times daily as needed for nausea or vomiting.  Marland Kitchen oxybutynin (DITROPAN) 5 MG tablet Take 1 tablet (5 mg total) by mouth 2 (two) times daily.  Marland Kitchen oxyCODONE (OXY IR/ROXICODONE) 5 MG immediate release tablet Take 5 mg by mouth 3 (three) times daily.   . pantoprazole (PROTONIX) 40 MG tablet Take 40 mg by mouth 2 (two) times daily.  . simvastatin (ZOCOR) 10 MG tablet TAKE 1 TABLET (10 MG TOTAL) BY MOUTH AT BEDTIME.  . traMADol-acetaminophen (ULTRACET) 37.5-325 MG per tablet Take 1 tablet by mouth 2 (two) times daily as needed.    No facility-administered encounter medications on file as of 09/15/2017.     Review of Systems  Constitutional: Negative for appetite change and unexpected weight change.  HENT: Negative for congestion and sinus pressure.   Respiratory: Negative for cough, chest tightness and shortness of breath.   Cardiovascular: Negative for chest pain, palpitations and leg swelling.  Gastrointestinal: Negative for abdominal pain, diarrhea, nausea and vomiting.  Genitourinary: Negative for difficulty urinating and dysuria.  Musculoskeletal: Positive for back pain. Negative for joint swelling and myalgias.  Skin: Negative for color change and rash.  Neurological: Negative for dizziness, light-headedness and headaches.  Psychiatric/Behavioral: Negative for agitation and dysphoric mood.       Objective:     Blood pressure rechecked by me:  132/62  Physical Exam  Constitutional: She appears well-developed and well-nourished. No distress.  HENT:  Nose: Nose normal.  Mouth/Throat: Oropharynx is clear and moist.  Neck: Neck supple. No thyromegaly present.  Cardiovascular: Normal rate and regular rhythm.  Pulmonary/Chest: Breath sounds normal. No  respiratory distress. She has no wheezes.  Abdominal: Soft. Bowel sounds are normal. There is no tenderness.  Musculoskeletal: She exhibits no edema or tenderness.  Lymphadenopathy:    She has no cervical adenopathy.  Skin: No rash noted. No erythema.  Psychiatric: She  has a normal mood and affect. Her behavior is normal.    BP 120/60   Pulse (!) 54   Temp 98.4 F (36.9 C) (Oral)   Resp 17   Ht 5\' 3"  (1.6 m)   Wt 144 lb 8 oz (65.5 kg)   SpO2 96%   BMI 25.60 kg/m  Wt Readings from Last 3 Encounters:  09/15/17 144 lb 8 oz (65.5 kg)  07/05/17 142 lb (64.4 kg)  06/01/17 142 lb 9.6 oz (64.7 kg)     Lab Results  Component Value Date   WBC 7.0 10/13/2016   HGB 13.8 10/13/2016   HCT 40.8 10/13/2016   PLT 320.0 10/13/2016   GLUCOSE 95 05/30/2017   CHOL 153 05/30/2017   TRIG 123.0 05/30/2017   HDL 69.20 05/30/2017   LDLCALC 59 05/30/2017   ALT 15 05/30/2017   AST 18 05/30/2017   NA 137 05/30/2017   K 4.8 05/30/2017   CL 102 05/30/2017   CREATININE 0.90 05/30/2017   BUN 27 (H) 05/30/2017   CO2 31 05/30/2017   TSH 1.84 10/13/2016   INR 0.91 06/11/2015   HGBA1C 5.6 05/30/2017       Assessment & Plan:   Problem List Items Addressed This Visit    Anemia, iron deficiency    Follow cbc.       Relevant Orders   CBC with Differential/Platelet   Barrett's esophagus    Followed by GI.  No upper symptoms.  On protonix.        Bilateral lower extremity edema    Doing well with compression hose.  Follow.       Chronic back pain    Followed by Dr Sharlet Salina.  Due to f/u with him next week.  Plans to discuss possibly starting a muscle relaxer with Dr Sharlet Salina.        Hereditary hemochromatosis ([place])    Followed by hematology.       Relevant Orders   Ferritin   IBC panel   Hypercholesteremia    On simvastatin.  Low cholesterol diet and exercise.  Follow lipid panel and liver function tests.        Relevant Orders   Hepatic function panel   Lipid panel    Hypertension    Blood pressure doing much better.  Hold on making changes in her medication.  Follow pressures.  Follow metabolic panel.        Relevant Orders   TSH   Basic metabolic panel    Other Visit Diagnoses    Encounter for screening mammogram for breast cancer    -  Primary   Relevant Orders   MM Digital Screening   Hyperglycemia       Relevant Orders   Hemoglobin A1c       [name], MD

## 2017-09-18 ENCOUNTER — Encounter: Payer: Self-pay | Admitting: Internal Medicine

## 2017-09-18 NOTE — Assessment & Plan Note (Signed)
On simvastatin.  Low cholesterol diet and exercise.  Follow lipid panel and liver function tests.   

## 2017-09-18 NOTE — Assessment & Plan Note (Signed)
Followed by hematology 

## 2017-09-18 NOTE — Assessment & Plan Note (Signed)
Followed by Dr Sharlet Salina.  Due to f/u with him next week.  Plans to discuss possibly starting a muscle relaxer with Dr Sharlet Salina.

## 2017-09-18 NOTE — Assessment & Plan Note (Signed)
Blood pressure doing much better.  Hold on making changes in her medication.  Follow pressures.  Follow metabolic panel.

## 2017-09-18 NOTE — Assessment & Plan Note (Signed)
Doing well with compression hose.  Follow.  

## 2017-09-18 NOTE — Assessment & Plan Note (Signed)
Followed by GI.  No upper symptoms.  On protonix.

## 2017-09-18 NOTE — Assessment & Plan Note (Signed)
Follow cbc.  

## 2017-09-19 DIAGNOSIS — M5416 Radiculopathy, lumbar region: Secondary | ICD-10-CM | POA: Diagnosis not present

## 2017-09-19 DIAGNOSIS — M7062 Trochanteric bursitis, left hip: Secondary | ICD-10-CM | POA: Diagnosis not present

## 2017-09-19 DIAGNOSIS — M62830 Muscle spasm of back: Secondary | ICD-10-CM | POA: Diagnosis not present

## 2017-09-19 DIAGNOSIS — M5136 Other intervertebral disc degeneration, lumbar region: Secondary | ICD-10-CM | POA: Diagnosis not present

## 2017-09-20 ENCOUNTER — Telehealth: Payer: Self-pay | Admitting: Internal Medicine

## 2017-09-20 NOTE — Telephone Encounter (Signed)
Copied from [place] [phone]. Topic: Quick Communication - See Telephone Encounter >> Sep 20, 2017  3:42 PM Bea Graff, NT wrote: CRM for notification. See Telephone encounter for: 09/20/17. Darlina Guys with [facility] calling and states they are able to go out and see the pt on 09/23/17. CB#: [phone]

## 2017-09-21 NOTE — Telephone Encounter (Signed)
LMTCB

## 2017-09-23 DIAGNOSIS — M5136 Other intervertebral disc degeneration, lumbar region: Secondary | ICD-10-CM | POA: Diagnosis not present

## 2017-09-23 DIAGNOSIS — K2270 Barrett's esophagus without dysplasia: Secondary | ICD-10-CM | POA: Diagnosis not present

## 2017-09-23 DIAGNOSIS — I10 Essential (primary) hypertension: Secondary | ICD-10-CM | POA: Diagnosis not present

## 2017-09-23 DIAGNOSIS — M7061 Trochanteric bursitis, right hip: Secondary | ICD-10-CM | POA: Diagnosis not present

## 2017-09-23 DIAGNOSIS — M5417 Radiculopathy, lumbosacral region: Secondary | ICD-10-CM | POA: Diagnosis not present

## 2017-09-23 DIAGNOSIS — M7062 Trochanteric bursitis, left hip: Secondary | ICD-10-CM | POA: Diagnosis not present

## 2017-09-26 DIAGNOSIS — M5417 Radiculopathy, lumbosacral region: Secondary | ICD-10-CM | POA: Diagnosis not present

## 2017-09-26 DIAGNOSIS — K2270 Barrett's esophagus without dysplasia: Secondary | ICD-10-CM | POA: Diagnosis not present

## 2017-09-26 DIAGNOSIS — I10 Essential (primary) hypertension: Secondary | ICD-10-CM | POA: Diagnosis not present

## 2017-09-26 DIAGNOSIS — M7062 Trochanteric bursitis, left hip: Secondary | ICD-10-CM | POA: Diagnosis not present

## 2017-09-26 DIAGNOSIS — M5136 Other intervertebral disc degeneration, lumbar region: Secondary | ICD-10-CM | POA: Diagnosis not present

## 2017-09-26 DIAGNOSIS — M7061 Trochanteric bursitis, right hip: Secondary | ICD-10-CM | POA: Diagnosis not present

## 2017-09-27 DIAGNOSIS — M7062 Trochanteric bursitis, left hip: Secondary | ICD-10-CM | POA: Diagnosis not present

## 2017-09-27 DIAGNOSIS — M5417 Radiculopathy, lumbosacral region: Secondary | ICD-10-CM | POA: Diagnosis not present

## 2017-09-27 DIAGNOSIS — M5136 Other intervertebral disc degeneration, lumbar region: Secondary | ICD-10-CM | POA: Diagnosis not present

## 2017-09-27 DIAGNOSIS — K2270 Barrett's esophagus without dysplasia: Secondary | ICD-10-CM | POA: Diagnosis not present

## 2017-09-27 DIAGNOSIS — I10 Essential (primary) hypertension: Secondary | ICD-10-CM | POA: Diagnosis not present

## 2017-09-27 DIAGNOSIS — M7061 Trochanteric bursitis, right hip: Secondary | ICD-10-CM | POA: Diagnosis not present

## 2017-09-29 DIAGNOSIS — M7061 Trochanteric bursitis, right hip: Secondary | ICD-10-CM | POA: Diagnosis not present

## 2017-09-29 DIAGNOSIS — I10 Essential (primary) hypertension: Secondary | ICD-10-CM | POA: Diagnosis not present

## 2017-09-29 DIAGNOSIS — M7062 Trochanteric bursitis, left hip: Secondary | ICD-10-CM | POA: Diagnosis not present

## 2017-09-29 DIAGNOSIS — M5136 Other intervertebral disc degeneration, lumbar region: Secondary | ICD-10-CM | POA: Diagnosis not present

## 2017-09-29 DIAGNOSIS — M5417 Radiculopathy, lumbosacral region: Secondary | ICD-10-CM | POA: Diagnosis not present

## 2017-09-29 DIAGNOSIS — K2270 Barrett's esophagus without dysplasia: Secondary | ICD-10-CM | POA: Diagnosis not present

## 2017-10-02 DIAGNOSIS — I10 Essential (primary) hypertension: Secondary | ICD-10-CM | POA: Diagnosis not present

## 2017-10-02 DIAGNOSIS — M7061 Trochanteric bursitis, right hip: Secondary | ICD-10-CM | POA: Diagnosis not present

## 2017-10-02 DIAGNOSIS — M7062 Trochanteric bursitis, left hip: Secondary | ICD-10-CM | POA: Diagnosis not present

## 2017-10-02 DIAGNOSIS — K2270 Barrett's esophagus without dysplasia: Secondary | ICD-10-CM | POA: Diagnosis not present

## 2017-10-02 DIAGNOSIS — M5136 Other intervertebral disc degeneration, lumbar region: Secondary | ICD-10-CM | POA: Diagnosis not present

## 2017-10-02 DIAGNOSIS — M5417 Radiculopathy, lumbosacral region: Secondary | ICD-10-CM | POA: Diagnosis not present

## 2017-10-03 DIAGNOSIS — M5136 Other intervertebral disc degeneration, lumbar region: Secondary | ICD-10-CM | POA: Diagnosis not present

## 2017-10-03 DIAGNOSIS — I10 Essential (primary) hypertension: Secondary | ICD-10-CM | POA: Diagnosis not present

## 2017-10-03 DIAGNOSIS — K2270 Barrett's esophagus without dysplasia: Secondary | ICD-10-CM | POA: Diagnosis not present

## 2017-10-03 DIAGNOSIS — M7061 Trochanteric bursitis, right hip: Secondary | ICD-10-CM | POA: Diagnosis not present

## 2017-10-03 DIAGNOSIS — M5417 Radiculopathy, lumbosacral region: Secondary | ICD-10-CM | POA: Diagnosis not present

## 2017-10-03 DIAGNOSIS — M7062 Trochanteric bursitis, left hip: Secondary | ICD-10-CM | POA: Diagnosis not present

## 2017-10-10 DIAGNOSIS — M7062 Trochanteric bursitis, left hip: Secondary | ICD-10-CM | POA: Diagnosis not present

## 2017-10-10 DIAGNOSIS — M5417 Radiculopathy, lumbosacral region: Secondary | ICD-10-CM | POA: Diagnosis not present

## 2017-10-10 DIAGNOSIS — M7061 Trochanteric bursitis, right hip: Secondary | ICD-10-CM | POA: Diagnosis not present

## 2017-10-10 DIAGNOSIS — K2270 Barrett's esophagus without dysplasia: Secondary | ICD-10-CM | POA: Diagnosis not present

## 2017-10-10 DIAGNOSIS — M5136 Other intervertebral disc degeneration, lumbar region: Secondary | ICD-10-CM | POA: Diagnosis not present

## 2017-10-10 DIAGNOSIS — I10 Essential (primary) hypertension: Secondary | ICD-10-CM | POA: Diagnosis not present

## 2017-10-11 DIAGNOSIS — M7061 Trochanteric bursitis, right hip: Secondary | ICD-10-CM | POA: Diagnosis not present

## 2017-10-11 DIAGNOSIS — I10 Essential (primary) hypertension: Secondary | ICD-10-CM | POA: Diagnosis not present

## 2017-10-11 DIAGNOSIS — M5136 Other intervertebral disc degeneration, lumbar region: Secondary | ICD-10-CM | POA: Diagnosis not present

## 2017-10-11 DIAGNOSIS — K2270 Barrett's esophagus without dysplasia: Secondary | ICD-10-CM | POA: Diagnosis not present

## 2017-10-11 DIAGNOSIS — M5417 Radiculopathy, lumbosacral region: Secondary | ICD-10-CM | POA: Diagnosis not present

## 2017-10-11 DIAGNOSIS — M7062 Trochanteric bursitis, left hip: Secondary | ICD-10-CM | POA: Diagnosis not present

## 2017-10-12 DIAGNOSIS — M5136 Other intervertebral disc degeneration, lumbar region: Secondary | ICD-10-CM | POA: Diagnosis not present

## 2017-10-12 DIAGNOSIS — M48062 Spinal stenosis, lumbar region with neurogenic claudication: Secondary | ICD-10-CM | POA: Diagnosis not present

## 2017-10-12 DIAGNOSIS — M5416 Radiculopathy, lumbar region: Secondary | ICD-10-CM | POA: Diagnosis not present

## 2017-10-17 DIAGNOSIS — M7062 Trochanteric bursitis, left hip: Secondary | ICD-10-CM | POA: Diagnosis not present

## 2017-10-17 DIAGNOSIS — M5417 Radiculopathy, lumbosacral region: Secondary | ICD-10-CM | POA: Diagnosis not present

## 2017-10-17 DIAGNOSIS — M5136 Other intervertebral disc degeneration, lumbar region: Secondary | ICD-10-CM | POA: Diagnosis not present

## 2017-10-17 DIAGNOSIS — K2270 Barrett's esophagus without dysplasia: Secondary | ICD-10-CM | POA: Diagnosis not present

## 2017-10-17 DIAGNOSIS — I10 Essential (primary) hypertension: Secondary | ICD-10-CM | POA: Diagnosis not present

## 2017-10-17 DIAGNOSIS — M7061 Trochanteric bursitis, right hip: Secondary | ICD-10-CM | POA: Diagnosis not present

## 2017-10-18 DIAGNOSIS — M5136 Other intervertebral disc degeneration, lumbar region: Secondary | ICD-10-CM | POA: Diagnosis not present

## 2017-10-18 DIAGNOSIS — M7062 Trochanteric bursitis, left hip: Secondary | ICD-10-CM | POA: Diagnosis not present

## 2017-10-18 DIAGNOSIS — M5417 Radiculopathy, lumbosacral region: Secondary | ICD-10-CM | POA: Diagnosis not present

## 2017-10-18 DIAGNOSIS — M7061 Trochanteric bursitis, right hip: Secondary | ICD-10-CM | POA: Diagnosis not present

## 2017-10-18 DIAGNOSIS — K2270 Barrett's esophagus without dysplasia: Secondary | ICD-10-CM | POA: Diagnosis not present

## 2017-10-18 DIAGNOSIS — I10 Essential (primary) hypertension: Secondary | ICD-10-CM | POA: Diagnosis not present

## 2017-10-19 DIAGNOSIS — I10 Essential (primary) hypertension: Secondary | ICD-10-CM | POA: Diagnosis not present

## 2017-10-19 DIAGNOSIS — M7062 Trochanteric bursitis, left hip: Secondary | ICD-10-CM | POA: Diagnosis not present

## 2017-10-19 DIAGNOSIS — M7061 Trochanteric bursitis, right hip: Secondary | ICD-10-CM | POA: Diagnosis not present

## 2017-10-19 DIAGNOSIS — M5417 Radiculopathy, lumbosacral region: Secondary | ICD-10-CM | POA: Diagnosis not present

## 2017-10-19 DIAGNOSIS — M5136 Other intervertebral disc degeneration, lumbar region: Secondary | ICD-10-CM | POA: Diagnosis not present

## 2017-10-19 DIAGNOSIS — K2270 Barrett's esophagus without dysplasia: Secondary | ICD-10-CM | POA: Diagnosis not present

## 2017-10-24 ENCOUNTER — Telehealth: Payer: Self-pay | Admitting: Radiology

## 2017-10-24 ENCOUNTER — Other Ambulatory Visit (INDEPENDENT_AMBULATORY_CARE_PROVIDER_SITE_OTHER): Payer: Medicare HMO

## 2017-10-24 DIAGNOSIS — E7800 Pure hypercholesterolemia, unspecified: Secondary | ICD-10-CM

## 2017-10-24 DIAGNOSIS — K2270 Barrett's esophagus without dysplasia: Secondary | ICD-10-CM | POA: Diagnosis not present

## 2017-10-24 DIAGNOSIS — M7062 Trochanteric bursitis, left hip: Secondary | ICD-10-CM | POA: Diagnosis not present

## 2017-10-24 DIAGNOSIS — D509 Iron deficiency anemia, unspecified: Secondary | ICD-10-CM

## 2017-10-24 DIAGNOSIS — I10 Essential (primary) hypertension: Secondary | ICD-10-CM

## 2017-10-24 DIAGNOSIS — R739 Hyperglycemia, unspecified: Secondary | ICD-10-CM

## 2017-10-24 DIAGNOSIS — M5136 Other intervertebral disc degeneration, lumbar region: Secondary | ICD-10-CM | POA: Diagnosis not present

## 2017-10-24 DIAGNOSIS — M5417 Radiculopathy, lumbosacral region: Secondary | ICD-10-CM | POA: Diagnosis not present

## 2017-10-24 DIAGNOSIS — M7061 Trochanteric bursitis, right hip: Secondary | ICD-10-CM | POA: Diagnosis not present

## 2017-10-24 LAB — IBC PANEL
IRON: 167 ug/dL — AB (ref 42–145)
Saturation Ratios: 59.9 % — ABNORMAL HIGH (ref 20.0–50.0)
Transferrin: 199 mg/dL — ABNORMAL LOW (ref 212.0–360.0)

## 2017-10-24 LAB — BASIC METABOLIC PANEL
BUN: 17 mg/dL (ref 6–23)
CALCIUM: 9.7 mg/dL (ref 8.4–10.5)
CO2: 26 meq/L (ref 19–32)
CREATININE: 0.9 mg/dL (ref 0.40–1.20)
Chloride: 104 mEq/L (ref 96–112)
GFR: 62.99 mL/min (ref >60.00)
GLUCOSE: 96 mg/dL (ref 70–99)
Potassium: 4.4 mEq/L (ref 3.5–5.1)
SODIUM: 139 meq/L (ref 135–145)

## 2017-10-24 LAB — TSH: TSH: 2.26 u[IU]/mL (ref 0.35–4.50)

## 2017-10-24 LAB — LIPID PANEL
CHOL/HDL RATIO: 2
Cholesterol: 143 mg/dL (ref 0–200)
HDL: 60.8 mg/dL (ref >39.00)
LDL CALC: 42 mg/dL (ref 0–99)
NONHDL: 81.96
TRIGLYCERIDES: 198 mg/dL — AB (ref 0.0–149.0)
VLDL: 39.6 mg/dL (ref 0.0–40.0)

## 2017-10-24 LAB — HEPATIC FUNCTION PANEL
ALK PHOS: 50 U/L (ref 39–117)
ALT: 13 U/L (ref 0–35)
AST: 18 U/L (ref 0–37)
Albumin: 4.3 g/dL (ref 3.5–5.2)
BILIRUBIN DIRECT: 0.1 mg/dL (ref 0.0–0.3)
BILIRUBIN TOTAL: 1 mg/dL (ref 0.2–1.2)
Total Protein: 6.7 g/dL (ref 6.0–8.3)

## 2017-10-24 LAB — FERRITIN: Ferritin: 478.9 ng/mL — ABNORMAL HIGH (ref 10.0–291.0)

## 2017-10-24 NOTE — Telephone Encounter (Signed)
Pt came in for labs today. Pt was a hard stick and stated she didn't have much water. Attempted pt twice. Did not get enough blood to run CBC or A1C. Advised pt I would send what I got and see what can be ran and advised pt to make another lab appt to get rest of blood when she hydrates.

## 2017-10-24 NOTE — Telephone Encounter (Signed)
She does need to return at her convenience for a1c and cbc.  See phone note.  (non fasting lab)

## 2017-10-25 NOTE — Telephone Encounter (Signed)
Pt daughter cindy called and stated that she would like to know if mother still needed to come in for lab draw. Please advise.

## 2017-10-25 NOTE — Telephone Encounter (Signed)
Called and notified patients daughter Jenny Reichmann that her mother is scheduled for a lab appointment on 10-26-17

## 2017-10-25 NOTE — Telephone Encounter (Signed)
Lab appt scheduled for 10/26/17

## 2017-10-26 ENCOUNTER — Other Ambulatory Visit (INDEPENDENT_AMBULATORY_CARE_PROVIDER_SITE_OTHER): Payer: Medicare HMO

## 2017-10-26 ENCOUNTER — Other Ambulatory Visit: Payer: Self-pay | Admitting: Internal Medicine

## 2017-10-26 DIAGNOSIS — R739 Hyperglycemia, unspecified: Secondary | ICD-10-CM | POA: Diagnosis not present

## 2017-10-26 NOTE — Progress Notes (Signed)
Order placed for hematology referral.  

## 2017-10-27 DIAGNOSIS — M5417 Radiculopathy, lumbosacral region: Secondary | ICD-10-CM | POA: Diagnosis not present

## 2017-10-27 DIAGNOSIS — K2270 Barrett's esophagus without dysplasia: Secondary | ICD-10-CM | POA: Diagnosis not present

## 2017-10-27 DIAGNOSIS — M7062 Trochanteric bursitis, left hip: Secondary | ICD-10-CM | POA: Diagnosis not present

## 2017-10-27 DIAGNOSIS — I10 Essential (primary) hypertension: Secondary | ICD-10-CM | POA: Diagnosis not present

## 2017-10-27 DIAGNOSIS — M5136 Other intervertebral disc degeneration, lumbar region: Secondary | ICD-10-CM | POA: Diagnosis not present

## 2017-10-27 DIAGNOSIS — M7061 Trochanteric bursitis, right hip: Secondary | ICD-10-CM | POA: Diagnosis not present

## 2017-10-27 LAB — CBC WITH DIFFERENTIAL/PLATELET
BASOS PCT: 1.3 % (ref 0.0–3.0)
Basophils Absolute: 0.1 10*3/uL (ref 0.0–0.1)
EOS ABS: 0.4 10*3/uL (ref 0.0–0.7)
EOS PCT: 6 % — AB (ref 0.0–5.0)
HCT: 34.9 % — ABNORMAL LOW (ref 36.0–46.0)
Hemoglobin: 11.9 g/dL — ABNORMAL LOW (ref 12.0–15.0)
Lymphocytes Relative: 26.9 % (ref 12.0–46.0)
Lymphs Abs: 1.9 10*3/uL (ref 0.7–4.0)
MCHC: 34.1 g/dL (ref 30.0–36.0)
MCV: 96 fl (ref 78.0–100.0)
Monocytes Absolute: 0.6 10*3/uL (ref 0.1–1.0)
Monocytes Relative: 8 % (ref 3.0–12.0)
NEUTROS ABS: 4.1 10*3/uL (ref 1.4–7.7)
Neutrophils Relative %: 57.8 % (ref 43.0–77.0)
Platelets: 229 10*3/uL (ref 150.0–400.0)
RBC: 3.63 Mil/uL — ABNORMAL LOW (ref 3.87–5.11)
RDW: 13.4 % (ref 11.5–15.5)
WBC: 7 10*3/uL (ref 4.0–10.5)

## 2017-10-27 LAB — HEMOGLOBIN A1C: HEMOGLOBIN A1C: 5.4 % (ref 4.6–6.5)

## 2017-10-30 DIAGNOSIS — K2270 Barrett's esophagus without dysplasia: Secondary | ICD-10-CM | POA: Diagnosis not present

## 2017-10-30 DIAGNOSIS — M5136 Other intervertebral disc degeneration, lumbar region: Secondary | ICD-10-CM | POA: Diagnosis not present

## 2017-10-30 DIAGNOSIS — M7062 Trochanteric bursitis, left hip: Secondary | ICD-10-CM | POA: Diagnosis not present

## 2017-10-30 DIAGNOSIS — I10 Essential (primary) hypertension: Secondary | ICD-10-CM | POA: Diagnosis not present

## 2017-10-30 DIAGNOSIS — M7061 Trochanteric bursitis, right hip: Secondary | ICD-10-CM | POA: Diagnosis not present

## 2017-10-30 DIAGNOSIS — M5417 Radiculopathy, lumbosacral region: Secondary | ICD-10-CM | POA: Diagnosis not present

## 2017-11-03 DIAGNOSIS — M7062 Trochanteric bursitis, left hip: Secondary | ICD-10-CM | POA: Diagnosis not present

## 2017-11-03 DIAGNOSIS — M5136 Other intervertebral disc degeneration, lumbar region: Secondary | ICD-10-CM | POA: Diagnosis not present

## 2017-11-03 DIAGNOSIS — M7061 Trochanteric bursitis, right hip: Secondary | ICD-10-CM | POA: Diagnosis not present

## 2017-11-03 DIAGNOSIS — I10 Essential (primary) hypertension: Secondary | ICD-10-CM | POA: Diagnosis not present

## 2017-11-03 DIAGNOSIS — K2270 Barrett's esophagus without dysplasia: Secondary | ICD-10-CM | POA: Diagnosis not present

## 2017-11-03 DIAGNOSIS — M5417 Radiculopathy, lumbosacral region: Secondary | ICD-10-CM | POA: Diagnosis not present

## 2017-11-06 ENCOUNTER — Encounter: Payer: Medicare HMO | Admitting: Oncology

## 2017-11-09 ENCOUNTER — Ambulatory Visit
Admission: RE | Admit: 2017-11-09 | Discharge: 2017-11-09 | Disposition: A | Discharge status: Home / Self Care | Payer: Medicare HMO | Source: Ambulatory Visit | Attending: Internal Medicine | Admitting: Internal Medicine

## 2017-11-09 DIAGNOSIS — Z1231 Encounter for screening mammogram for malignant neoplasm of breast: Secondary | ICD-10-CM | POA: Diagnosis not present

## 2017-11-12 ENCOUNTER — Emergency Department
Admission: EM | Admit: 2017-11-12 | Discharge: 2017-11-12 | Disposition: A | Discharge status: Home / Self Care | Payer: Medicare HMO | Attending: Emergency Medicine | Admitting: Emergency Medicine

## 2017-11-12 ENCOUNTER — Encounter: Payer: Self-pay | Admitting: Radiology

## 2017-11-12 ENCOUNTER — Emergency Department: Payer: Medicare HMO

## 2017-11-12 DIAGNOSIS — Z85038 Personal history of other malignant neoplasm of large intestine: Secondary | ICD-10-CM | POA: Diagnosis not present

## 2017-11-12 DIAGNOSIS — Z79899 Other long term (current) drug therapy: Secondary | ICD-10-CM | POA: Diagnosis not present

## 2017-11-12 DIAGNOSIS — R112 Nausea with vomiting, unspecified: Secondary | ICD-10-CM | POA: Diagnosis not present

## 2017-11-12 DIAGNOSIS — K449 Diaphragmatic hernia without obstruction or gangrene: Secondary | ICD-10-CM | POA: Diagnosis not present

## 2017-11-12 DIAGNOSIS — K567 Ileus, unspecified: Secondary | ICD-10-CM | POA: Diagnosis not present

## 2017-11-12 DIAGNOSIS — R109 Unspecified abdominal pain: Secondary | ICD-10-CM | POA: Diagnosis present

## 2017-11-12 DIAGNOSIS — N390 Urinary tract infection, site not specified: Secondary | ICD-10-CM | POA: Insufficient documentation

## 2017-11-12 DIAGNOSIS — I10 Essential (primary) hypertension: Secondary | ICD-10-CM | POA: Insufficient documentation

## 2017-11-12 DIAGNOSIS — R1030 Lower abdominal pain, unspecified: Secondary | ICD-10-CM | POA: Diagnosis not present

## 2017-11-12 LAB — URINALYSIS, COMPLETE (UACMP) WITH MICROSCOPIC
Bilirubin Urine: NEGATIVE
Glucose, UA: NEGATIVE mg/dL
Ketones, ur: NEGATIVE mg/dL
Nitrite: NEGATIVE
PROTEIN: NEGATIVE mg/dL
Specific Gravity, Urine: 1.006 (ref 1.005–1.030)
pH: 7 (ref 5.0–8.0)

## 2017-11-12 LAB — COMPREHENSIVE METABOLIC PANEL
ALBUMIN: 4.6 g/dL (ref 3.5–5.0)
ALT: 22 U/L (ref 0–44)
ANION GAP: 11 (ref 5–15)
AST: 31 U/L (ref 15–41)
Alkaline Phosphatase: 55 U/L (ref 38–126)
BUN: 20 mg/dL (ref 8–23)
CHLORIDE: 104 mmol/L (ref 98–111)
CO2: 24 mmol/L (ref 22–32)
Calcium: 10.1 mg/dL (ref 8.9–10.3)
Creatinine, Ser: 0.95 mg/dL (ref 0.44–1.00)
GFR calc Af Amer: >60 mL/min (ref >60)
GFR calc non Af Amer: 53 mL/min — ABNORMAL LOW (ref >60)
GLUCOSE: 125 mg/dL — AB (ref 70–99)
Potassium: 4.1 mmol/L (ref 3.5–5.1)
SODIUM: 139 mmol/L (ref 135–145)
TOTAL PROTEIN: 7 g/dL (ref 6.5–8.1)
Total Bilirubin: 1 mg/dL (ref 0.3–1.2)

## 2017-11-12 LAB — CBC
HEMATOCRIT: 42.3 % (ref 35.0–47.0)
Hemoglobin: 14.5 g/dL (ref 12.0–16.0)
MCH: 33.2 pg (ref 26.0–34.0)
MCHC: 34.4 g/dL (ref 32.0–36.0)
MCV: 96.5 fL (ref 80.0–100.0)
Platelets: 274 10*3/uL (ref 150–440)
RBC: 4.38 MIL/uL (ref 3.80–5.20)
RDW: 13.2 % (ref 11.5–14.5)
WBC: 11.4 10*3/uL — ABNORMAL HIGH (ref 3.6–11.0)

## 2017-11-12 LAB — LIPASE, BLOOD: Lipase: 32 U/L (ref 11–51)

## 2017-11-12 MED ORDER — CEPHALEXIN 500 MG PO CAPS
500.0000 mg | ORAL_CAPSULE | Freq: Once | ORAL | Status: AC
  Administered 2017-11-12: 500 mg via ORAL
  Filled 2017-11-12: qty 1

## 2017-11-12 MED ORDER — CEPHALEXIN 500 MG PO CAPS: 500.0000 mg | ORAL_CAPSULE | Freq: Two times a day (BID) | ORAL | 0 refills | Status: DC

## 2017-11-12 MED ORDER — MORPHINE SULFATE (PF) 2 MG/ML IV SOLN
2.0000 mg | Freq: Once | INTRAVENOUS | Status: AC
  Administered 2017-11-12: 2 mg via INTRAVENOUS
  Filled 2017-11-12: qty 1

## 2017-11-12 MED ORDER — IOPAMIDOL (ISOVUE-300) INJECTION 61%
100.0000 mL | Freq: Once | INTRAVENOUS | Status: AC | PRN
  Administered 2017-11-12: 100 mL via INTRAVENOUS

## 2017-11-12 MED ORDER — ONDANSETRON HCL 4 MG/2ML IJ SOLN: 4.0000 mg | Freq: Once | INTRAMUSCULAR | Status: DC

## 2017-11-12 NOTE — ED Triage Notes (Signed)
Pt arrived via [facility] with a chief compliant of nausea and LLQ pain. Pt reports remote history of diverticulitis. Received 4mg  of Ondansetron en route to [facility] ED that relieved her nausea. EMS BP elevated SBP 190 and per pt that is abnormally high for her. She reports being prescribed two unknown blood pressure medications and took both this morning.

## 2017-11-12 NOTE — ED Provider Notes (Signed)
[facility] Emergency Department Provider Note   ____________________________________________    I have reviewed the triage vital signs and the nursing notes.   HISTORY  Chief Complaint Abdominal Pain and Nausea     HPI [name] is a 84 y.o. female with a history of small bowel obstruction, diverticulitis who presents today with abdominal pain.  Patient reports that approximately 4 PM she developed lower abdominal pain which has become worse and worse.  She reports the pain is moderate to severe.  She denies fevers or chills.  She does have some nausea but no vomiting.  She has not taken anything for this.  No radiation of pain   Past Medical History:  Diagnosis Date  . Anemia   . Barrett's esophagus   . Bowel obstruction (HCC)    s/p adhesion resection  . Chronic back pain   . Colon cancer ([place]) 1988 and 1989   adenocarcinoma, s/p resection x  2 and chemo  . Diverticulitis   . GERD (gastroesophageal reflux disease)   . H/O open leg wound   . Hiatal hernia   . Hypercholesteremia   . Hypertension   . Lumbar scoliosis   . OA (osteoarthritis)   . Peripheral neuropathy   . Recurrent sinus infections   . Renal cyst   . S/P chemotherapy, time since greater than 12 weeks    colon cancer  . Vertigo     Patient Active Problem List   Diagnosis Date Noted  . Cold foot 05/15/2017  . Constipation 08/15/2016  . Small bowel obstruction ([place]) 11/24/2015  . Swelling of left lower extremity 07/16/2015  . Acute Ileitis 06/12/2015  . Hypokalemia 06/12/2015  . Accelerated hypertension 06/12/2015  . Hereditary hemochromatosis ([place]) 05/25/2015  . Leg wound, right 05/19/2015  . Bilateral lower extremity edema 05/19/2015  . Iron excess 04/16/2015  . Dizziness 10/26/2014  . Hyperbilirubinemia 10/26/2014  . Health care maintenance 05/18/2014  . Anemia, iron deficiency 04/21/2014  . Hospital discharge follow-up 04/14/2014  . Sweats, menopausal  03/07/2014  . Face lesion 11/03/2013  . Numbness in feet 10/08/2012  . Nocturia 10/08/2012  . Hypertension 12/18/2011  . Hypercholesteremia 12/18/2011  . History of colon cancer 12/18/2011  . Barrett's esophagus 12/18/2011  . Chronic back pain 12/18/2011  . Osteopenia 12/18/2011    Past Surgical History:  Procedure Laterality Date  . ABDOMINAL HYSTERECTOMY  1982  . adhesions resected     bowel obstruction  . APPENDECTOMY    . BREAST BIOPSY Left    negative 06/14/1985  . CHOLECYSTECTOMY  1994  . COLON RESECTION     x2.  s/p colon cancer  . [place]  . EXCISIONAL HEMORRHOIDECTOMY     with tubal ligation  . EXPLORATORY LAPAROTOMY  1991   Secondary to SBO    Prior to Admission medications   Medication Sig Start Date End Date Taking? Authorizing Provider  amLODipine (NORVASC) 10 MG tablet TAKE 1 TABLET EVERY DAY 02/23/17   [name], MD  Calcium Carbonate-Vitamin D (CALCIUM 600 + D PO) Take 600 Units by mouth 2 (two) times daily with a meal.     [provider]  cephALEXin (KEFLEX) 500 MG capsule Take 1 capsule (500 mg total) by mouth 2 (two) times daily. 11/12/17   Lavonia Drafts, MD  fexofenadine (ALLEGRA) 60 MG tablet Take 1 tablet (60 mg total) by mouth daily. 04/25/16   [name], MD  irbesartan (AVAPRO) 75 MG tablet TAKE 1 TABLET (75 MG TOTAL) DAILY BY MOUTH. 06/15/17   [name], MD  meclizine (ANTIVERT) 25 MG tablet Take 25 mg by mouth 3 (three) times daily as needed.    [provider]  meloxicam (MOBIC) 7.5 MG tablet Take 7.5 mg by mouth daily.    [provider]  metoprolol succinate (TOPROL-XL) 25 MG 24 hr tablet TAKE 1 TABLET (25 MG TOTAL) BY MOUTH DAILY. 05/01/17   [name], MD  ondansetron (ZOFRAN) 4 MG tablet Take 1 tablet (4 mg total) by mouth 2 (two) times daily as needed for nausea or vomiting. 06/25/15   [name], MD  oxybutynin (DITROPAN) 5 MG tablet Take 1 tablet (5 mg  total) by mouth 2 (two) times daily. 08/22/17   [name], MD  oxyCODONE (OXY IR/ROXICODONE) 5 MG immediate release tablet Take 5 mg by mouth 3 (three) times daily.     [provider]  pantoprazole (PROTONIX) 40 MG tablet Take 40 mg by mouth 2 (two) times daily.    [provider]  simvastatin (ZOCOR) 10 MG tablet TAKE 1 TABLET (10 MG TOTAL) BY MOUTH AT BEDTIME. 05/01/17   [name], MD  traMADol-acetaminophen (ULTRACET) 37.5-325 MG per tablet Take 1 tablet by mouth 2 (two) times daily as needed.     [provider]     Allergies Fentanyl  Family History  Problem Relation Age of Onset  . Breast cancer Mother   . Asthma Mother   . Stroke Father   . Hypertension Father   . Diabetes Father   . Ovarian cancer Sister        x2  . Prostate cancer Brother   . Hypertension Brother        x3  . Heart disease Brother   . Hypercholesterolemia Brother        x3  . Diabetes Brother   . Spina bifida Grandchild   . Hematuria Neg Hx   . Kidney cancer Neg Hx   . Kidney disease Neg Hx   . Sickle cell trait Neg Hx   . Tuberculosis Neg Hx     Social History Social History   Tobacco Use  . Smoking status: Never Smoker  . Smokeless tobacco: Never Used  Substance Use Topics  . Alcohol use: No    Alcohol/week: 0.0 standard drinks  . Drug use: No    Review of Systems  Constitutional: No fever/chills Eyes: No visual changes.  ENT: No sore throat. Cardiovascular: Denies chest pain. Respiratory: Denies shortness of breath. Gastrointestinal: As above Genitourinary: Negative for dysuria. Musculoskeletal: Negative for back pain. Skin: Negative for rash. Neurological: Negative for headaches   ____________________________________________   PHYSICAL EXAM:  VITAL SIGNS: ED Triage Vitals  Enc Vitals Group     BP 11/12/17 1929 (!) 181/76     Pulse Rate 11/12/17 1929 76     Resp 11/12/17 1929 15     Temp 11/12/17 1929 98.8 F (37.1 C)     Temp  Source 11/12/17 1929 Oral     SpO2 11/12/17 1929 92 %     Weight --      Height --      Head Circumference --      Peak Flow --      Pain Score 11/12/17 1930 0     Pain Loc --      Pain Edu? --      Excl. in [place]? --  Constitutional: Alert and oriented. No acute distress. Pleasant and interactive  Nose: No congestion/rhinnorhea. Mouth/Throat: Mucous membranes are moist.    Cardiovascular: Normal rate, regular rhythm. Grossly normal heart sounds.  Good peripheral circulation. Respiratory: Normal respiratory effort.  No retractions. Lungs CTAB. Gastrointestinal: Tenderness to palpation right lower quadrant, left lower quadrant, no significant distention.  No CVA tenderness  Musculoskeletal: No lower extremity tenderness nor edema.  Warm and well perfused Neurologic:  Normal speech and language. Skin:  Skin is warm, dry and intact. No rash noted. Psychiatric: Mood and affect are normal. Speech and behavior are normal.  ____________________________________________   LABS (all labs ordered are listed, but only abnormal results are displayed)  Labs Reviewed  COMPREHENSIVE METABOLIC PANEL - Abnormal; Notable for the following components:      Result Value   Glucose, Bld 125 (*)    GFR calc non Af Amer 53 (*)    All other components within normal limits  URINALYSIS, COMPLETE (UACMP) WITH MICROSCOPIC - Abnormal; Notable for the following components:   Color, Urine YELLOW (*)    APPearance CLOUDY (*)    Hgb urine dipstick SMALL (*)    Leukocytes, UA LARGE (*)    WBC, UA >50 (*)    Bacteria, UA RARE (*)    Non Squamous Epithelial PRESENT (*)    All other components within normal limits  CBC - Abnormal; Notable for the following components:   WBC 11.4 (*)    All other components within normal limits  LIPASE, BLOOD   ____________________________________________  EKG  ED ECG REPORT I, Lavonia Drafts, the attending physician, personally viewed and interpreted this  ECG.  Date: 11/12/2017   Rhythm: normal sinus rhythm QRS Axis: normal Intervals: Normal ST/T Wave abnormalities: normal Narrative Interpretation: no evidence of acute ischemia  ____________________________________________  RADIOLOGY  CT abdomen pelvis consistent with enteritis, no obstruction ____________________________________________   PROCEDURES  Procedure(s) performed: No  Procedures   Critical Care performed: No ____________________________________________   INITIAL IMPRESSION / ASSESSMENT AND PLAN / ED COURSE  Pertinent labs & imaging results that were available during my care of the patient were reviewed by me and considered in my medical decision making (see chart for details).  Presents with lower abdominal pain, significant tenderness on exam.  Will give small dose of IV morphine, IV Zofran.  Obtain labs, obtain CT given concern for possible obstruction versus diverticulitis.  ----------------------------------------- 10:17 PM on 11/12/2017 -----------------------------------------  CT scan consistent with enteritis, likely UTI on urinalysis.  Reevaluate patient she reports she feels significantly better.  No tenderness on exam.  Discussed with her the need for treatment for UTI, will increase her MiraLAX as well.    ____________________________________________   FINAL CLINICAL IMPRESSION(S) / ED DIAGNOSES  Final diagnoses:  Lower urinary tract infectious disease  Lower abdominal pain  Ileus ([place])        Note:  This document was prepared using Dragon voice recognition software and may include unintentional dictation errors.    Lavonia Drafts, MD 11/12/17 2318

## 2017-11-18 ENCOUNTER — Other Ambulatory Visit: Payer: Self-pay | Admitting: Internal Medicine

## 2017-12-04 ENCOUNTER — Telehealth: Payer: Self-pay | Admitting: Internal Medicine

## 2017-12-04 MED ORDER — FEXOFENADINE HCL 60 MG PO TABS: 60.0000 mg | ORAL_TABLET | Freq: Every day | ORAL | 3 refills | Status: DC

## 2017-12-04 NOTE — Telephone Encounter (Signed)
Medication was last filled in 04/2016 for #90 with 3 refills. OK to send in for her?

## 2017-12-04 NOTE — Telephone Encounter (Signed)
rx sent in to [facility] for [place] #90 with 3 refills.

## 2017-12-04 NOTE — Telephone Encounter (Signed)
Copied from [place] [phone]. Topic: Quick Communication - Rx Refill/Question >> Dec 04, 2017  2:33 PM Alfredia Ferguson R wrote: Medication: fexofenadine (ALLEGRA) 60 MG tablet     Has the patient contacted their pharmacy?Yes  Preferred Pharmacy (with phone number or street name): [place], [place] [phone] (Phone) [phone] (Fax)    Agent: Please be advised that RX refills may take up to 3 business days. We ask that you follow-up with your pharmacy.

## 2017-12-05 NOTE — Telephone Encounter (Signed)
Patient's husband is aware

## 2017-12-20 DIAGNOSIS — M5136 Other intervertebral disc degeneration, lumbar region: Secondary | ICD-10-CM | POA: Diagnosis not present

## 2017-12-20 DIAGNOSIS — M5416 Radiculopathy, lumbar region: Secondary | ICD-10-CM | POA: Diagnosis not present

## 2017-12-20 DIAGNOSIS — M62830 Muscle spasm of back: Secondary | ICD-10-CM | POA: Diagnosis not present

## 2017-12-26 ENCOUNTER — Ambulatory Visit (INDEPENDENT_AMBULATORY_CARE_PROVIDER_SITE_OTHER): Payer: Medicare HMO | Admitting: Internal Medicine

## 2017-12-26 VITALS — BP 136/68 | HR 76 | Temp 98.4°F | Resp 18 | Wt 140.0 lb

## 2017-12-26 DIAGNOSIS — D509 Iron deficiency anemia, unspecified: Secondary | ICD-10-CM | POA: Diagnosis not present

## 2017-12-26 DIAGNOSIS — R739 Hyperglycemia, unspecified: Secondary | ICD-10-CM

## 2017-12-26 DIAGNOSIS — G8929 Other chronic pain: Secondary | ICD-10-CM

## 2017-12-26 DIAGNOSIS — K22719 Barrett's esophagus with dysplasia, unspecified: Secondary | ICD-10-CM

## 2017-12-26 DIAGNOSIS — Z0000 Encounter for general adult medical examination without abnormal findings: Secondary | ICD-10-CM | POA: Diagnosis not present

## 2017-12-26 DIAGNOSIS — M545 Low back pain, unspecified: Secondary | ICD-10-CM

## 2017-12-26 DIAGNOSIS — E7800 Pure hypercholesterolemia, unspecified: Secondary | ICD-10-CM | POA: Diagnosis not present

## 2017-12-26 DIAGNOSIS — I10 Essential (primary) hypertension: Secondary | ICD-10-CM

## 2017-12-26 MED ORDER — MIRABEGRON ER 25 MG PO TB24: 25.0000 mg | ORAL_TABLET | Freq: Every day | ORAL | 2 refills | Status: DC

## 2017-12-26 NOTE — Progress Notes (Signed)
Patient ID: [name], female   DOB: 30-Sep-1928, 84 y.o.   MRN: [id number]   Subjective:    Patient ID: [name], female    DOB: 12/23/1928, 84 y.o.   MRN: [id number]  HPI  Patient here for her physical exam.  She is accompanied by her husband.  History obtained from both of them.  Just evaluated through Dr Sharlet Salina office.  S/p injection.  Helping.  She feels things are stable.  No chest pain. No sob. No acid reflux. No abdominal pain.  Bowels moving.  Reviewed outside blood pressure readings.  Blood pressures averaging 120-140/60-80s.  Overall she feels things are stable.  Recently evaluated in ER.  Diagnosed with uti.  Symptoms resolved.     Past Medical History:  Diagnosis Date  . Anemia   . Barrett's esophagus   . Bowel obstruction (HCC)    s/p adhesion resection  . Chronic back pain   . Colon cancer ([place]) 1988 and 1989   adenocarcinoma, s/p resection x  2 and chemo  . Diverticulitis   . GERD (gastroesophageal reflux disease)   . H/O open leg wound   . Hiatal hernia   . Hypercholesteremia   . Hypertension   . Lumbar scoliosis   . OA (osteoarthritis)   . Peripheral neuropathy   . Recurrent sinus infections   . Renal cyst   . S/P chemotherapy, time since greater than 12 weeks    colon cancer  . Vertigo    Past Surgical History:  Procedure Laterality Date  . ABDOMINAL HYSTERECTOMY  1982  . adhesions resected     bowel obstruction  . APPENDECTOMY    . BREAST BIOPSY Left    negative 06/14/1985  . CHOLECYSTECTOMY  1994  . COLON RESECTION     x2.  s/p colon cancer  . [place]  . EXCISIONAL HEMORRHOIDECTOMY     with tubal ligation  . EXPLORATORY LAPAROTOMY  1991   Secondary to SBO   Family History  Problem Relation Age of Onset  . Breast cancer Mother   . Asthma Mother   . Stroke Father   . Hypertension Father   . Diabetes Father   . Ovarian cancer Sister        x2  . Prostate cancer Brother   . Hypertension  Brother        x3  . Heart disease Brother   . Hypercholesterolemia Brother        x3  . Diabetes Brother   . Spina bifida Grandchild   . Hematuria Neg Hx   . Kidney cancer Neg Hx   . Kidney disease Neg Hx   . Sickle cell trait Neg Hx   . Tuberculosis Neg Hx    Social History   Socioeconomic History  . Marital status: Married    Spouse name: Not on file  . Number of children: 4  . Years of education: 12th grade  . Highest education level: Not on file  Occupational History  . Occupation: homemaker  Social Needs  . Financial resource strain: Not hard at all  . Food insecurity:    Worry: Never true    Inability: Never true  . Transportation needs:    Medical: No    Non-medical: No  Tobacco Use  . Smoking status: Never Smoker  . Smokeless tobacco: Never Used  Substance and Sexual Activity  . Alcohol use: No    Alcohol/week: 0.0  standard drinks  . Drug use: No  . Sexual activity: Never  Lifestyle  . Physical activity:    Days per week: Not on file    Minutes per session: Not on file  . Stress: Not at all  Relationships  . Social connections:    Talks on phone: Not on file    Gets together: Not on file    Attends religious service: Not on file    Active member of club or organization: Not on file    Attends meetings of clubs or organizations: Not on file    Relationship status: Not on file  Other Topics Concern  . Not on file  Social History Narrative  . Not on file    Outpatient Encounter Medications as of 12/26/2017  Medication Sig  . amLODipine (NORVASC) 10 MG tablet TAKE 1 TABLET EVERY DAY  . Calcium Carbonate-Vitamin D (CALCIUM 600 + D PO) Take 600 Units by mouth 2 (two) times daily with a meal.   . fexofenadine (ALLEGRA) 60 MG tablet Take 1 tablet (60 mg total) by mouth daily.  . meclizine (ANTIVERT) 25 MG tablet Take 25 mg by mouth 3 (three) times daily as needed.  . meloxicam (MOBIC) 7.5 MG tablet Take 7.5 mg by mouth daily.  . metoprolol succinate  (TOPROL-XL) 25 MG 24 hr tablet TAKE 1 TABLET (25 MG TOTAL) BY MOUTH DAILY.  . mirabegron ER (MYRBETRIQ) 25 MG TB24 tablet Take 1 tablet (25 mg total) by mouth daily.  . ondansetron (ZOFRAN) 4 MG tablet Take 1 tablet (4 mg total) by mouth 2 (two) times daily as needed for nausea or vomiting.  Marland Kitchen oxyCODONE (OXY IR/ROXICODONE) 5 MG immediate release tablet Take 5 mg by mouth 3 (three) times daily.   . pantoprazole (PROTONIX) 40 MG tablet Take 40 mg by mouth 2 (two) times daily.  . simvastatin (ZOCOR) 10 MG tablet TAKE 1 TABLET (10 MG TOTAL) BY MOUTH AT BEDTIME.  . traMADol-acetaminophen (ULTRACET) 37.5-325 MG per tablet Take 1 tablet by mouth 2 (two) times daily as needed.   . [DISCONTINUED] cephALEXin (KEFLEX) 500 MG capsule Take 1 capsule (500 mg total) by mouth 2 (two) times daily.  . [DISCONTINUED] irbesartan (AVAPRO) 75 MG tablet TAKE 1 TABLET (75 MG TOTAL) DAILY BY MOUTH.  . [DISCONTINUED] oxybutynin (DITROPAN) 5 MG tablet Take 1 tablet (5 mg total) by mouth 2 (two) times daily.   No facility-administered encounter medications on file as of 12/26/2017.     Review of Systems  Constitutional: Negative for appetite change and unexpected weight change.  HENT: Negative for congestion and sinus pressure.   Eyes: Negative for pain and visual disturbance.  Respiratory: Negative for cough, chest tightness and shortness of breath.   Cardiovascular: Negative for chest pain, palpitations and leg swelling.  Gastrointestinal: Negative for abdominal pain, diarrhea, nausea and vomiting.  Genitourinary: Negative for difficulty urinating and dysuria.  Musculoskeletal: Positive for back pain. Negative for joint swelling and myalgias.  Skin: Negative for color change and rash.  Neurological: Negative for dizziness, light-headedness and headaches.  Hematological: Negative for adenopathy. Does not bruise/bleed easily.  Psychiatric/Behavioral: Negative for agitation and dysphoric mood.       Objective:      Physical Exam  Constitutional: She is oriented to person, place, and time. She appears well-developed and well-nourished. No distress.  HENT:  Nose: Nose normal.  Mouth/Throat: Oropharynx is clear and moist.  Eyes: Right eye exhibits no discharge. Left eye exhibits no discharge. No scleral icterus.  Neck: Neck supple. No thyromegaly present.  Cardiovascular: Normal rate and regular rhythm.  Pulmonary/Chest: Breath sounds normal. No accessory muscle usage. No tachypnea. No respiratory distress. She has no decreased breath sounds. She has no wheezes. She has no rhonchi. Right breast exhibits no inverted nipple, no mass, no nipple discharge and no tenderness (no axillary adenopathy). Left breast exhibits no inverted nipple, no mass, no nipple discharge and no tenderness (no axilarry adenopathy).  Abdominal: Soft. Bowel sounds are normal. There is no tenderness.  Musculoskeletal: She exhibits no edema or tenderness.  Lymphadenopathy:    She has no cervical adenopathy.  Neurological: She is alert and oriented to person, place, and time.  Skin: No rash noted. No erythema.  Psychiatric: She has a normal mood and affect. Her behavior is normal.    BP 136/68 (BP Location: Left Arm, Patient Position: Sitting, Cuff Size: Normal)   Pulse 76   Temp 98.4 F (36.9 C) (Oral)   Resp 18   Wt 140 lb (63.5 kg)   SpO2 97%   BMI 24.80 kg/m  Wt Readings from Last 3 Encounters:  12/26/17 140 lb (63.5 kg)  09/15/17 144 lb 8 oz (65.5 kg)  07/05/17 142 lb (64.4 kg)     Lab Results  Component Value Date   WBC 11.4 (H) 11/12/2017   HGB 14.5 11/12/2017   HCT 42.3 11/12/2017   PLT 274 11/12/2017   GLUCOSE 125 (H) 11/12/2017   CHOL 143 10/24/2017   TRIG 198.0 (H) 10/24/2017   HDL 60.80 10/24/2017   LDLCALC 42 10/24/2017   ALT 22 11/12/2017   AST 31 11/12/2017   NA 139 11/12/2017   K 4.1 11/12/2017   CL 104 11/12/2017   CREATININE 0.95 11/12/2017   BUN 20 11/12/2017   CO2 24 11/12/2017   TSH  2.26 10/24/2017   INR 0.91 06/11/2015   HGBA1C 5.4 10/26/2017    Ct Abdomen Pelvis W Contrast  Result Date: 11/12/2017 CLINICAL DATA:  Nausea, LEFT lower quadrant pain. History of colon cancer, appendectomy, cholecystectomy, hysterectomy, diverticulitis. EXAM: CT ABDOMEN AND PELVIS WITH CONTRAST TECHNIQUE: Multidetector CT imaging of the abdomen and pelvis was performed using the standard protocol following bolus administration of intravenous contrast. CONTRAST:  111mL ISOVUE-300 IOPAMIDOL (ISOVUE-300) INJECTION 61% COMPARISON:  CT abdomen and pelvis August 11, 2016 FINDINGS: LOWER CHEST: Lung bases are clear. Included heart size is mildly enlarged. No pericardial effusion. HEPATOBILIARY: Status post cholecystectomy. Similar postoperative intrahepatic biliary dilatation. A few punctate hepatic granulomas. PANCREAS: Nonacute.  Punctate splenic calcification. SPLEEN: Calcified splenic granulomas.  Normal spleen size. ADRENALS/URINARY TRACT: Kidneys are orthotopic, demonstrating symmetric enhancement. No nephrolithiasis, hydronephrosis or solid renal masses. RIGHT interpolar scarring. 4.4 cm homogeneously hypodense cyst upper pole LEFT kidney with capsular vessel, stable. Too small to characterize hypodensities bilateral kidneys. The unopacified ureters are normal in course and caliber. Delayed imaging through the kidneys demonstrates symmetric prompt contrast excretion within the proximal urinary collecting system. Urinary bladder is well distended and unremarkable. Normal adrenal glands. STOMACH/BOWEL: Moderate hiatal hernia. Status post RIGHT hemicolectomy. Fluid-filled colon 25.7 cm with gradual transition to stool-filled descending and sigmoid colon. Fluid-filled small bowel dilated to 3.3 cm. Known transition point. No bowel wall thickening or inflammatory changes. VASCULAR/LYMPHATIC: Aortoiliac vessels are caliber, tortuous course. Moderate calcific atherosclerosis. No lymphadenopathy by CT size criteria.  REPRODUCTIVE: Status post hysterectomy. OTHER: No intraperitoneal free fluid or free air. MUSCULOSKELETAL: Nonacute. Severe thoracolumbar levoscoliosis. Anterior abdominal wall scarring. IMPRESSION: 1. Fluid distended small and large bowel seen with  enteritis and ileus. No bowel obstruction. Status post RIGHT hemicolectomy. 2. Moderate hiatal hernia. Aortic Atherosclerosis (ICD10-I70.0). Electronically Signed   By: Elon Alas M.D.   On: 11/12/2017 22:13       Assessment & Plan:   Problem List Items Addressed This Visit    Anemia, iron deficiency    Follow cbc.        Relevant Orders   Ferritin   CBC with Differential/Platelet   IBC panel   Barrett's esophagus    Followed by GI.  No upper symptoms.  Continue protonix.        Chronic back pain    Followed by Dr Sharlet Salina.  Just evaluated by nurse practitioner.  Has f/u 01/24/18.        Health care maintenance    Physical today 12/26/17.  Mammogram 11/09/17 - Birads I.  Colonoscopy 07/2010.  Followed by GI.  Was told did not need another colonoscopy by GI.        Hereditary hemochromatosis ([facility])    Was followed by hematology.  Discussed with her today.  She declined referral back.  Follow cbc and iron stores.        Hypercholesteremia    On simvastatin.  Low cholesterol diet and exercise.  Follow lipid panel and liver function tests.        Relevant Orders   Hepatic function panel   Lipid panel   Hypertension    Blood pressure doing better.  Outside blood pressures reviewed.  Continue current medication regimen.  Follow pressures.  Follow metabolic panel.        Relevant Orders   Basic metabolic panel    Other Visit Diagnoses    Hyperglycemia    -  Primary   Relevant Orders   Hemoglobin A1c       [name], MD

## 2017-12-29 ENCOUNTER — Other Ambulatory Visit: Payer: Self-pay | Admitting: Internal Medicine

## 2017-12-31 ENCOUNTER — Encounter: Payer: Self-pay | Admitting: Internal Medicine

## 2017-12-31 NOTE — Assessment & Plan Note (Signed)
Followed by GI.  No upper symptoms.  Continue protonix.

## 2017-12-31 NOTE — Assessment & Plan Note (Signed)
Follow cbc.  

## 2017-12-31 NOTE — Assessment & Plan Note (Signed)
Physical today 12/26/17.  Mammogram 11/09/17 - Birads I.  Colonoscopy 07/2010.  Followed by GI.  Was told did not need another colonoscopy by GI.

## 2017-12-31 NOTE — Assessment & Plan Note (Signed)
Was followed by hematology.  Discussed with her today.  She declined referral back.  Follow cbc and iron stores.

## 2017-12-31 NOTE — Assessment & Plan Note (Signed)
On simvastatin.  Low cholesterol diet and exercise.  Follow lipid panel and liver function tests.   

## 2017-12-31 NOTE — Assessment & Plan Note (Signed)
Followed by Dr Sharlet Salina.  Just evaluated by nurse practitioner.  Has f/u 01/24/18.

## 2017-12-31 NOTE — Assessment & Plan Note (Signed)
Blood pressure doing better.  Outside blood pressures reviewed.  Continue current medication regimen.  Follow pressures.  Follow metabolic panel.

## 2018-01-24 DIAGNOSIS — M5416 Radiculopathy, lumbar region: Secondary | ICD-10-CM | POA: Diagnosis not present

## 2018-01-24 DIAGNOSIS — M48062 Spinal stenosis, lumbar region with neurogenic claudication: Secondary | ICD-10-CM | POA: Diagnosis not present

## 2018-01-24 DIAGNOSIS — M5136 Other intervertebral disc degeneration, lumbar region: Secondary | ICD-10-CM | POA: Diagnosis not present

## 2018-02-05 ENCOUNTER — Other Ambulatory Visit: Payer: Medicare HMO

## 2018-02-19 DIAGNOSIS — D1800 Hemangioma unspecified site: Secondary | ICD-10-CM | POA: Diagnosis not present

## 2018-02-19 DIAGNOSIS — L812 Freckles: Secondary | ICD-10-CM | POA: Diagnosis not present

## 2018-02-19 DIAGNOSIS — D229 Melanocytic nevi, unspecified: Secondary | ICD-10-CM | POA: Diagnosis not present

## 2018-02-19 DIAGNOSIS — L578 Other skin changes due to chronic exposure to nonionizing radiation: Secondary | ICD-10-CM | POA: Diagnosis not present

## 2018-02-19 DIAGNOSIS — Z85828 Personal history of other malignant neoplasm of skin: Secondary | ICD-10-CM | POA: Diagnosis not present

## 2018-02-19 DIAGNOSIS — L304 Erythema intertrigo: Secondary | ICD-10-CM | POA: Diagnosis not present

## 2018-02-19 DIAGNOSIS — D692 Other nonthrombocytopenic purpura: Secondary | ICD-10-CM | POA: Diagnosis not present

## 2018-02-19 DIAGNOSIS — Z1283 Encounter for screening for malignant neoplasm of skin: Secondary | ICD-10-CM | POA: Diagnosis not present

## 2018-02-19 DIAGNOSIS — L821 Other seborrheic keratosis: Secondary | ICD-10-CM | POA: Diagnosis not present

## 2018-02-26 DIAGNOSIS — H5213 Myopia, bilateral: Secondary | ICD-10-CM | POA: Diagnosis not present

## 2018-03-17 ENCOUNTER — Other Ambulatory Visit: Payer: Self-pay | Admitting: Internal Medicine

## 2018-03-21 ENCOUNTER — Other Ambulatory Visit (INDEPENDENT_AMBULATORY_CARE_PROVIDER_SITE_OTHER): Payer: Medicare HMO

## 2018-03-21 DIAGNOSIS — D509 Iron deficiency anemia, unspecified: Secondary | ICD-10-CM

## 2018-03-21 DIAGNOSIS — E7800 Pure hypercholesterolemia, unspecified: Secondary | ICD-10-CM

## 2018-03-21 DIAGNOSIS — R739 Hyperglycemia, unspecified: Secondary | ICD-10-CM

## 2018-03-21 DIAGNOSIS — I10 Essential (primary) hypertension: Secondary | ICD-10-CM | POA: Diagnosis not present

## 2018-03-21 LAB — CBC WITH DIFFERENTIAL/PLATELET
BASOS ABS: 0.1 10*3/uL (ref 0.0–0.1)
Basophils Relative: 0.9 % (ref 0.0–3.0)
EOS ABS: 0.6 10*3/uL (ref 0.0–0.7)
Eosinophils Relative: 8.5 % — ABNORMAL HIGH (ref 0.0–5.0)
HEMATOCRIT: 40.8 % (ref 36.0–46.0)
HEMOGLOBIN: 13.6 g/dL (ref 12.0–15.0)
LYMPHS PCT: 24.2 % (ref 12.0–46.0)
Lymphs Abs: 1.6 10*3/uL (ref 0.7–4.0)
MCHC: 33.3 g/dL (ref 30.0–36.0)
MCV: 97.1 fl (ref 78.0–100.0)
MONO ABS: 0.5 10*3/uL (ref 0.1–1.0)
Monocytes Relative: 7.5 % (ref 3.0–12.0)
Neutro Abs: 3.9 10*3/uL (ref 1.4–7.7)
Neutrophils Relative %: 58.9 % (ref 43.0–77.0)
Platelets: 257 10*3/uL (ref 150.0–400.0)
RBC: 4.2 Mil/uL (ref 3.87–5.11)
RDW: 13.8 % (ref 11.5–15.5)
WBC: 6.6 10*3/uL (ref 4.0–10.5)

## 2018-03-21 LAB — LIPID PANEL
CHOL/HDL RATIO: 2
Cholesterol: 162 mg/dL (ref 0–200)
HDL: 68.2 mg/dL (ref >39.00)
LDL Cholesterol: 73 mg/dL (ref 0–99)
NONHDL: 93.89
Triglycerides: 102 mg/dL (ref 0.0–149.0)
VLDL: 20.4 mg/dL (ref 0.0–40.0)

## 2018-03-21 LAB — HEPATIC FUNCTION PANEL
ALBUMIN: 4.3 g/dL (ref 3.5–5.2)
ALK PHOS: 63 U/L (ref 39–117)
ALT: 13 U/L (ref 0–35)
AST: 18 U/L (ref 0–37)
BILIRUBIN TOTAL: 1 mg/dL (ref 0.2–1.2)
Bilirubin, Direct: 0.2 mg/dL (ref 0.0–0.3)
TOTAL PROTEIN: 6.6 g/dL (ref 6.0–8.3)

## 2018-03-21 LAB — BASIC METABOLIC PANEL
BUN: 23 mg/dL (ref 6–23)
CALCIUM: 10 mg/dL (ref 8.4–10.5)
CO2: 28 meq/L (ref 19–32)
Chloride: 102 mEq/L (ref 96–112)
Creatinine, Ser: 0.94 mg/dL (ref 0.40–1.20)
GFR: 56.31 mL/min — ABNORMAL LOW (ref >60.00)
GLUCOSE: 86 mg/dL (ref 70–99)
Potassium: 4.5 mEq/L (ref 3.5–5.1)
Sodium: 140 mEq/L (ref 135–145)

## 2018-03-21 LAB — HEMOGLOBIN A1C: HEMOGLOBIN A1C: 5.3 % (ref 4.6–6.5)

## 2018-03-21 LAB — IBC + FERRITIN
Ferritin: 335.9 ng/mL — ABNORMAL HIGH (ref 10.0–291.0)
IRON: 133 ug/dL (ref 42–145)
SATURATION RATIOS: 46.8 % (ref 20.0–50.0)
TRANSFERRIN: 203 mg/dL — AB (ref 212.0–360.0)

## 2018-03-28 DIAGNOSIS — M62830 Muscle spasm of back: Secondary | ICD-10-CM | POA: Diagnosis not present

## 2018-03-28 DIAGNOSIS — M5416 Radiculopathy, lumbar region: Secondary | ICD-10-CM | POA: Diagnosis not present

## 2018-03-28 DIAGNOSIS — M7062 Trochanteric bursitis, left hip: Secondary | ICD-10-CM | POA: Diagnosis not present

## 2018-03-28 DIAGNOSIS — M48062 Spinal stenosis, lumbar region with neurogenic claudication: Secondary | ICD-10-CM | POA: Diagnosis not present

## 2018-03-28 DIAGNOSIS — M5136 Other intervertebral disc degeneration, lumbar region: Secondary | ICD-10-CM | POA: Diagnosis not present

## 2018-04-04 ENCOUNTER — Ambulatory Visit (INDEPENDENT_AMBULATORY_CARE_PROVIDER_SITE_OTHER): Payer: Medicare HMO | Admitting: Internal Medicine

## 2018-04-04 ENCOUNTER — Encounter: Payer: Self-pay | Admitting: Internal Medicine

## 2018-04-04 VITALS — BP 130/65 | HR 57 | Temp 98.7°F | Wt 141.0 lb

## 2018-04-04 DIAGNOSIS — E7800 Pure hypercholesterolemia, unspecified: Secondary | ICD-10-CM

## 2018-04-04 DIAGNOSIS — D509 Iron deficiency anemia, unspecified: Secondary | ICD-10-CM | POA: Diagnosis not present

## 2018-04-04 DIAGNOSIS — R232 Flushing: Secondary | ICD-10-CM

## 2018-04-04 DIAGNOSIS — I10 Essential (primary) hypertension: Secondary | ICD-10-CM

## 2018-04-04 DIAGNOSIS — R350 Frequency of micturition: Secondary | ICD-10-CM

## 2018-04-04 DIAGNOSIS — I739 Peripheral vascular disease, unspecified: Secondary | ICD-10-CM

## 2018-04-04 DIAGNOSIS — M545 Low back pain, unspecified: Secondary | ICD-10-CM

## 2018-04-04 DIAGNOSIS — R600 Localized edema: Secondary | ICD-10-CM | POA: Diagnosis not present

## 2018-04-04 DIAGNOSIS — G8929 Other chronic pain: Secondary | ICD-10-CM

## 2018-04-04 DIAGNOSIS — K22719 Barrett's esophagus with dysplasia, unspecified: Secondary | ICD-10-CM | POA: Diagnosis not present

## 2018-04-04 DIAGNOSIS — R739 Hyperglycemia, unspecified: Secondary | ICD-10-CM

## 2018-04-04 LAB — URINALYSIS, ROUTINE W REFLEX MICROSCOPIC
BILIRUBIN URINE: NEGATIVE
HGB URINE DIPSTICK: NEGATIVE
KETONES UR: NEGATIVE
Nitrite: NEGATIVE
Specific Gravity, Urine: 1.02 (ref 1.000–1.030)
TOTAL PROTEIN, URINE-UPE24: NEGATIVE
UROBILINOGEN UA: 0.2 (ref 0.0–1.0)
Urine Glucose: NEGATIVE
pH: 6 (ref 5.0–8.0)

## 2018-04-04 NOTE — Progress Notes (Signed)
Patient ID: [name], female   DOB: 1928/03/16, 85 y.o.   MRN: [id number]   Subjective:    Patient ID: [name], female    DOB: [birth year], 85 y.o.   MRN: [id number]  HPI  Patient here for a scheduled follow up.  She is accompanied by her husband.  History obtained from both of them.  She reports she is doing relatively well.  Tries to stay active.  Chronic pain.  Sees Dr Sharlet Salina.  Stable.  No chest pain.  No sob.  No acid reflux.  No abdominal pain.  Bowels moving.  Does report hot flashes.  May last 3-5 minutes.  Occurs multiple times per day.  On no estrogen and has not been on estrogen.  No fever.  No evidence of infection.  No headache.  No new supplements.  Blood pressure averaging 130-140/70-80.     Past Medical History:  Diagnosis Date  . Anemia   . Barrett's esophagus   . Bowel obstruction (HCC)    s/p adhesion resection  . Chronic back pain   . Colon cancer ([place]) 1988 and 1989   adenocarcinoma, s/p resection x  2 and chemo  . Diverticulitis   . GERD (gastroesophageal reflux disease)   . H/O open leg wound   . Hiatal hernia   . Hypercholesteremia   . Hypertension   . Lumbar scoliosis   . OA (osteoarthritis)   . Peripheral neuropathy   . Recurrent sinus infections   . Renal cyst   . S/P chemotherapy, time since greater than 12 weeks    colon cancer  . Vertigo    Past Surgical History:  Procedure Laterality Date  . ABDOMINAL HYSTERECTOMY  1982  . adhesions resected     bowel obstruction  . APPENDECTOMY    . BREAST BIOPSY Left    negative 06/14/1985  . CHOLECYSTECTOMY  1994  . COLON RESECTION     x2.  s/p colon cancer  . [place]  . EXCISIONAL HEMORRHOIDECTOMY     with tubal ligation  . EXPLORATORY LAPAROTOMY  1991   Secondary to SBO   Family History  Problem Relation Age of Onset  . Breast cancer Mother   . Asthma Mother   . Stroke Father   . Hypertension Father   . Diabetes Father   . Ovarian cancer  Sister        x2  . Prostate cancer Brother   . Hypertension Brother        x3  . Heart disease Brother   . Hypercholesterolemia Brother        x3  . Diabetes Brother   . Spina bifida Grandchild   . Hematuria Neg Hx   . Kidney cancer Neg Hx   . Kidney disease Neg Hx   . Sickle cell trait Neg Hx   . Tuberculosis Neg Hx    Social History   Socioeconomic History  . Marital status: Married    Spouse name: Not on file  . Number of children: 4  . Years of education: 12th grade  . Highest education level: Not on file  Occupational History  . Occupation: homemaker  Social Needs  . Financial resource strain: Not hard at all  . Food insecurity:    Worry: Never true    Inability: Never true  . Transportation needs:    Medical: No    Non-medical: No  Tobacco Use  . Smoking  status: Never Smoker  . Smokeless tobacco: Never Used  Substance and Sexual Activity  . Alcohol use: No    Alcohol/week: 0.0 standard drinks  . Drug use: No  . Sexual activity: Never  Lifestyle  . Physical activity:    Days per week: Not on file    Minutes per session: Not on file  . Stress: Not at all  Relationships  . Social connections:    Talks on phone: Not on file    Gets together: Not on file    Attends religious service: Not on file    Active member of club or organization: Not on file    Attends meetings of clubs or organizations: Not on file    Relationship status: Not on file  Other Topics Concern  . Not on file  Social History Narrative  . Not on file    Outpatient Encounter Medications as of 04/04/2018  Medication Sig  . amLODipine (NORVASC) 10 MG tablet TAKE 1 TABLET EVERY DAY  . Calcium Carbonate-Vitamin D (CALCIUM 600 + D PO) Take 600 Units by mouth 2 (two) times daily with a meal.   . fexofenadine (ALLEGRA) 60 MG tablet Take 1 tablet (60 mg total) by mouth daily.  . irbesartan (AVAPRO) 75 MG tablet TAKE 1 TABLET EVERY DAY  . meclizine (ANTIVERT) 25 MG tablet Take 25 mg by mouth  3 (three) times daily as needed.  . meloxicam (MOBIC) 7.5 MG tablet Take 7.5 mg by mouth daily.  . metoprolol succinate (TOPROL-XL) 25 MG 24 hr tablet TAKE 1 TABLET (25 MG TOTAL) BY MOUTH DAILY.  . MYRBETRIQ 25 MG TB24 tablet TAKE 1 TABLET BY MOUTH EVERY DAY  . ondansetron (ZOFRAN) 4 MG tablet Take 1 tablet (4 mg total) by mouth 2 (two) times daily as needed for nausea or vomiting.  Marland Kitchen oxyCODONE (OXY IR/ROXICODONE) 5 MG immediate release tablet Take 5 mg by mouth 3 (three) times daily.   . pantoprazole (PROTONIX) 40 MG tablet Take 40 mg by mouth 2 (two) times daily.  . simvastatin (ZOCOR) 10 MG tablet TAKE 1 TABLET (10 MG TOTAL) BY MOUTH AT BEDTIME.  . traMADol-acetaminophen (ULTRACET) 37.5-325 MG per tablet Take 1 tablet by mouth 2 (two) times daily as needed.    No facility-administered encounter medications on file as of 04/04/2018.     Review of Systems  Constitutional: Negative for appetite change and unexpected weight change.  HENT: Negative for congestion and sinus pressure.   Respiratory: Negative for cough, chest tightness and shortness of breath.   Cardiovascular: Negative for chest pain, palpitations and leg swelling.  Gastrointestinal: Negative for abdominal pain, diarrhea, nausea and vomiting.  Genitourinary: Positive for frequency. Negative for difficulty urinating and dysuria.  Musculoskeletal: Negative for myalgias.       Chronic back pain as outlined.    Skin: Negative for color change and rash.  Neurological: Negative for dizziness, light-headedness and headaches.  Psychiatric/Behavioral: Negative for agitation and dysphoric mood.       Objective:    Physical Exam Constitutional:      General: She is not in acute distress.    Appearance: Normal appearance.  HENT:     Nose: Nose normal. No congestion.     Mouth/Throat:     Pharynx: No oropharyngeal exudate or posterior oropharyngeal erythema.  Neck:     Musculoskeletal: Neck supple. No muscular tenderness.      Thyroid: No thyromegaly.  Cardiovascular:     Rate and Rhythm: Normal rate and regular  rhythm.  Pulmonary:     Effort: No respiratory distress.     Breath sounds: Normal breath sounds. No wheezing.  Abdominal:     General: Bowel sounds are normal.     Palpations: Abdomen is soft.     Tenderness: There is no abdominal tenderness.  Musculoskeletal:        General: No swelling or tenderness.  Lymphadenopathy:     Cervical: No cervical adenopathy.  Skin:    Findings: No erythema or rash.  Neurological:     Mental Status: She is alert.  Psychiatric:        Mood and Affect: Mood normal.        Behavior: Behavior normal.     BP 130/65   Pulse (!) 57   Temp 98.7 F (37.1 C) (Oral)   Wt 141 lb (64 kg)   SpO2 96%   BMI 24.98 kg/m  Wt Readings from Last 3 Encounters:  04/04/18 141 lb (64 kg)  12/26/17 140 lb (63.5 kg)  09/15/17 144 lb 8 oz (65.5 kg)     Lab Results  Component Value Date   WBC 6.6 03/21/2018   HGB 13.6 03/21/2018   HCT 40.8 03/21/2018   PLT 257.0 03/21/2018   GLUCOSE 86 03/21/2018   CHOL 162 03/21/2018   TRIG 102.0 03/21/2018   HDL 68.20 03/21/2018   LDLCALC 73 03/21/2018   ALT 13 03/21/2018   AST 18 03/21/2018   NA 140 03/21/2018   K 4.5 03/21/2018   CL 102 03/21/2018   CREATININE 0.94 03/21/2018   BUN 23 03/21/2018   CO2 28 03/21/2018   TSH 2.26 10/24/2017   INR 0.91 06/11/2015   HGBA1C 5.3 03/21/2018    Ct Abdomen Pelvis W Contrast  Result Date: 11/12/2017 CLINICAL DATA:  Nausea, LEFT lower quadrant pain. History of colon cancer, appendectomy, cholecystectomy, hysterectomy, diverticulitis. EXAM: CT ABDOMEN AND PELVIS WITH CONTRAST TECHNIQUE: Multidetector CT imaging of the abdomen and pelvis was performed using the standard protocol following bolus administration of intravenous contrast. CONTRAST:  117mL ISOVUE-300 IOPAMIDOL (ISOVUE-300) INJECTION 61% COMPARISON:  CT abdomen and pelvis August 11, 2016 FINDINGS: LOWER CHEST: Lung bases are clear.  Included heart size is mildly enlarged. No pericardial effusion. HEPATOBILIARY: Status post cholecystectomy. Similar postoperative intrahepatic biliary dilatation. A few punctate hepatic granulomas. PANCREAS: Nonacute.  Punctate splenic calcification. SPLEEN: Calcified splenic granulomas.  Normal spleen size. ADRENALS/URINARY TRACT: Kidneys are orthotopic, demonstrating symmetric enhancement. No nephrolithiasis, hydronephrosis or solid renal masses. RIGHT interpolar scarring. 4.4 cm homogeneously hypodense cyst upper pole LEFT kidney with capsular vessel, stable. Too small to characterize hypodensities bilateral kidneys. The unopacified ureters are normal in course and caliber. Delayed imaging through the kidneys demonstrates symmetric prompt contrast excretion within the proximal urinary collecting system. Urinary bladder is well distended and unremarkable. Normal adrenal glands. STOMACH/BOWEL: Moderate hiatal hernia. Status post RIGHT hemicolectomy. Fluid-filled colon 25.7 cm with gradual transition to stool-filled descending and sigmoid colon. Fluid-filled small bowel dilated to 3.3 cm. Known transition point. No bowel wall thickening or inflammatory changes. VASCULAR/LYMPHATIC: Aortoiliac vessels are caliber, tortuous course. Moderate calcific atherosclerosis. No lymphadenopathy by CT size criteria. REPRODUCTIVE: Status post hysterectomy. OTHER: No intraperitoneal free fluid or free air. MUSCULOSKELETAL: Nonacute. Severe thoracolumbar levoscoliosis. Anterior abdominal wall scarring. IMPRESSION: 1. Fluid distended small and large bowel seen with enteritis and ileus. No bowel obstruction. Status post RIGHT hemicolectomy. 2. Moderate hiatal hernia. Aortic Atherosclerosis (ICD10-I70.0). Electronically Signed   By: Elon Alas M.D.   On: 11/12/2017 22:13  Assessment & Plan:   Problem List Items Addressed This Visit    Anemia, iron deficiency    Follow cbc.        Barrett's esophagus    Followed  by GI.  No upper symptoms.  Continue protonix.        Bilateral lower extremity edema    Improved.  Continue compression hose.        Chronic back pain    Followed by Dr Sharlet Salina.        Hereditary hemochromatosis ([place])    Was followed by hematology.  Elevated iron stores.  Discussed with her regarding need for f/u with hematology.  She declines.  Will follow cbc and ferritin.        Hot flashes    Unclear etiology.  Not on estrogen and has not been on estrogen supplements.  Recent labs including thyroid and sugar ok.  No new supplements.  No other associated symptoms.  Will have endocrinology evaluate for persistent hot flashes.        Relevant Orders   Ambulatory referral to Endocrinology   Hypercholesteremia    On simvastatin.  Low cholesterol diet and exercise.  Follow lipid panel and liver function tests.        Relevant Orders   Hepatic function panel   Lipid panel   Hypertension    Blood pressure as outlined.  Continue same medication regimen.  Follow pressures.  Follow metabolic panel.        Relevant Orders   Basic metabolic panel   PAD (peripheral artery disease) ([place])    Previously evaluated by vascular surgery.  Stable.  Continue risk factor modification.         Other Visit Diagnoses    Urinary frequency    -  Primary   Relevant Orders   Urinalysis, Routine w reflex microscopic (Completed)   Urine Culture (Completed)   Hyperglycemia       Relevant Orders   Hemoglobin A1c       [name], MD

## 2018-04-05 LAB — URINE CULTURE
MICRO NUMBER: 215432
SPECIMEN QUALITY: ADEQUATE

## 2018-04-07 ENCOUNTER — Encounter: Payer: Self-pay | Admitting: Internal Medicine

## 2018-04-07 DIAGNOSIS — I739 Peripheral vascular disease, unspecified: Secondary | ICD-10-CM | POA: Insufficient documentation

## 2018-04-07 NOTE — Assessment & Plan Note (Signed)
Followed by Dr Chasnis.   

## 2018-04-07 NOTE — Assessment & Plan Note (Signed)
Unclear etiology.  Not on estrogen and has not been on estrogen supplements.  Recent labs including thyroid and sugar ok.  No new supplements.  No other associated symptoms.  Will have endocrinology evaluate for persistent hot flashes.

## 2018-04-07 NOTE — Assessment & Plan Note (Signed)
Blood pressure as outlined.  Continue same medication regimen.  Follow pressures.  Follow metabolic panel.  

## 2018-04-07 NOTE — Assessment & Plan Note (Signed)
Previously evaluated by vascular surgery.  Stable.  Continue risk factor modification.

## 2018-04-07 NOTE — Assessment & Plan Note (Signed)
Improved.  Continue compression hose.  

## 2018-04-07 NOTE — Assessment & Plan Note (Signed)
Follow cbc.  

## 2018-04-07 NOTE — Assessment & Plan Note (Signed)
Followed by GI.  No upper symptoms.  Continue protonix.

## 2018-04-07 NOTE — Assessment & Plan Note (Signed)
On simvastatin.  Low cholesterol diet and exercise.  Follow lipid panel and liver function tests.   

## 2018-04-07 NOTE — Assessment & Plan Note (Signed)
Was followed by hematology.  Elevated iron stores.  Discussed with her regarding need for f/u with hematology.  She declines.  Will follow cbc and ferritin.

## 2018-04-15 ENCOUNTER — Encounter: Payer: Self-pay | Admitting: Emergency Medicine

## 2018-04-15 ENCOUNTER — Emergency Department: Payer: Medicare HMO

## 2018-04-15 ENCOUNTER — Emergency Department
Admission: EM | Admit: 2018-04-15 | Discharge: 2018-04-15 | Disposition: A | Discharge status: Home / Self Care | Payer: Medicare HMO | Attending: Emergency Medicine | Admitting: Emergency Medicine

## 2018-04-15 ENCOUNTER — Other Ambulatory Visit: Payer: Self-pay

## 2018-04-15 DIAGNOSIS — Z79899 Other long term (current) drug therapy: Secondary | ICD-10-CM | POA: Diagnosis not present

## 2018-04-15 DIAGNOSIS — S3993XA Unspecified injury of pelvis, initial encounter: Secondary | ICD-10-CM | POA: Diagnosis not present

## 2018-04-15 DIAGNOSIS — I10 Essential (primary) hypertension: Secondary | ICD-10-CM | POA: Insufficient documentation

## 2018-04-15 DIAGNOSIS — S51802A Unspecified open wound of left forearm, initial encounter: Secondary | ICD-10-CM | POA: Insufficient documentation

## 2018-04-15 DIAGNOSIS — M25532 Pain in left wrist: Secondary | ICD-10-CM | POA: Insufficient documentation

## 2018-04-15 DIAGNOSIS — R102 Pelvic and perineal pain: Secondary | ICD-10-CM | POA: Diagnosis not present

## 2018-04-15 DIAGNOSIS — Y9389 Activity, other specified: Secondary | ICD-10-CM | POA: Diagnosis not present

## 2018-04-15 DIAGNOSIS — Y92000 Kitchen of unspecified non-institutional (private) residence as  the place of occurrence of the external cause: Secondary | ICD-10-CM | POA: Diagnosis not present

## 2018-04-15 DIAGNOSIS — W01198A Fall on same level from slipping, tripping and stumbling with subsequent striking against other object, initial encounter: Secondary | ICD-10-CM | POA: Insufficient documentation

## 2018-04-15 DIAGNOSIS — Z23 Encounter for immunization: Secondary | ICD-10-CM | POA: Insufficient documentation

## 2018-04-15 DIAGNOSIS — S3992XA Unspecified injury of lower back, initial encounter: Secondary | ICD-10-CM | POA: Diagnosis not present

## 2018-04-15 DIAGNOSIS — Z85038 Personal history of other malignant neoplasm of large intestine: Secondary | ICD-10-CM | POA: Diagnosis not present

## 2018-04-15 DIAGNOSIS — S41112A Laceration without foreign body of left upper arm, initial encounter: Secondary | ICD-10-CM | POA: Diagnosis not present

## 2018-04-15 DIAGNOSIS — W19XXXA Unspecified fall, initial encounter: Secondary | ICD-10-CM

## 2018-04-15 DIAGNOSIS — S300XXA Contusion of lower back and pelvis, initial encounter: Secondary | ICD-10-CM | POA: Diagnosis not present

## 2018-04-15 DIAGNOSIS — Y999 Unspecified external cause status: Secondary | ICD-10-CM | POA: Diagnosis not present

## 2018-04-15 DIAGNOSIS — S6992XA Unspecified injury of left wrist, hand and finger(s), initial encounter: Secondary | ICD-10-CM | POA: Diagnosis not present

## 2018-04-15 MED ORDER — TETANUS-DIPHTH-ACELL PERTUSSIS 5-2.5-18.5 LF-MCG/0.5 IM SUSP
0.5000 mL | Freq: Once | INTRAMUSCULAR | Status: AC
  Administered 2018-04-15: 0.5 mL via INTRAMUSCULAR
  Filled 2018-04-15: qty 0.5

## 2018-04-15 NOTE — ED Notes (Signed)
Family out to desk, very upset on concerned regarding wait time for MD and xray. No xray ordered in chart. Family states "they said they were coming for xray." family informed no xray has been ordered.

## 2018-04-15 NOTE — ED Notes (Signed)
Patient transported to X-ray 

## 2018-04-15 NOTE — ED Triage Notes (Signed)
Pt to ED via POV c/o mechanical fall and skin tear on left arm. Pt denies hitting head, pt is not on blood thinners. Pt is in NAD at this time.

## 2018-04-15 NOTE — ED Provider Notes (Signed)
[facility] Emergency Department Provider Note  ____________________________________________  Time seen: Approximately 7:20 PM  I have reviewed the triage vital signs and the nursing notes.   HISTORY  Chief Complaint Fall and Laceration    HPI [name] is a 85 y.o. female who presents the emergency department complaining of lower back and left wrist pain after a fall.  Patient reports that she was getting something out of her freezer which was a low-lying freezer.  Patient reports that as she stood up her momentum carried her backwards.  She reports that she struck her wrist on the freezer door causing 2 distinct skin tears to the forearm.  Patient then landed on her buttock/lower back.  Initially her main complaint was left wrist injury but states that she is developed some lower back and buttocks pain as well.  Patient has been ambulatory after the incident.  She did not hit her head or lose consciousness.  She is unsure of her last tetanus shot.  No other injury or complaint at this time.  Patient does have an extensive medical history to include peripheral artery disease, anemia, colon cancer, GERD, hypertension, scoliosis, peripheral neuropathy, hypertension, hypercholesterolemia.  She denies any complaints with chronic medical issues at this time.    Past Medical History:  Diagnosis Date  . Anemia   . Barrett's esophagus   . Bowel obstruction (HCC)    s/p adhesion resection  . Chronic back pain   . Colon cancer ([place]) 1988 and 1989   adenocarcinoma, s/p resection x  2 and chemo  . Diverticulitis   . GERD (gastroesophageal reflux disease)   . H/O open leg wound   . Hiatal hernia   . Hypercholesteremia   . Hypertension   . Lumbar scoliosis   . OA (osteoarthritis)   . Peripheral neuropathy   . Recurrent sinus infections   . Renal cyst   . S/P chemotherapy, time since greater than 12 weeks    colon cancer  . Vertigo     Patient Active Problem  List   Diagnosis Date Noted  . PAD (peripheral artery disease) ([place]) 04/07/2018  . Cold foot 05/15/2017  . Constipation 08/15/2016  . Small bowel obstruction ([place]) 11/24/2015  . Swelling of left lower extremity 07/16/2015  . Acute Ileitis 06/12/2015  . Hypokalemia 06/12/2015  . Accelerated hypertension 06/12/2015  . Hereditary hemochromatosis ([place]) 05/25/2015  . Leg wound, right 05/19/2015  . Bilateral lower extremity edema 05/19/2015  . Iron excess 04/16/2015  . Dizziness 10/26/2014  . Hyperbilirubinemia 10/26/2014  . Health care maintenance 05/18/2014  . Anemia, iron deficiency 04/21/2014  . Hospital discharge follow-up 04/14/2014  . Hot flashes 03/07/2014  . Face lesion 11/03/2013  . Numbness in feet 10/08/2012  . Nocturia 10/08/2012  . Hypertension 12/18/2011  . Hypercholesteremia 12/18/2011  . History of colon cancer 12/18/2011  . Barrett's esophagus 12/18/2011  . Chronic back pain 12/18/2011  . Osteopenia 12/18/2011    Past Surgical History:  Procedure Laterality Date  . ABDOMINAL HYSTERECTOMY  1982  . adhesions resected     bowel obstruction  . APPENDECTOMY    . BREAST BIOPSY Left    negative 06/14/1985  . CHOLECYSTECTOMY  1994  . COLON RESECTION     x2.  s/p colon cancer  . [place]  . EXCISIONAL HEMORRHOIDECTOMY     with tubal ligation  . EXPLORATORY LAPAROTOMY  1991   Secondary to SBO  Prior to Admission medications   Medication Sig Start Date End Date Taking? Authorizing Provider  amLODipine (NORVASC) 10 MG tablet TAKE 1 TABLET EVERY DAY 11/20/17   [name], MD  Calcium Carbonate-Vitamin D (CALCIUM 600 + D PO) Take 600 Units by mouth 2 (two) times daily with a meal.     [provider]  fexofenadine (ALLEGRA) 60 MG tablet Take 1 tablet (60 mg total) by mouth daily. 12/04/17   [name], MD  irbesartan (AVAPRO) 75 MG tablet TAKE 1 TABLET EVERY DAY 12/29/17   [name], MD  meclizine  (ANTIVERT) 25 MG tablet Take 25 mg by mouth 3 (three) times daily as needed.    [provider]  meloxicam (MOBIC) 7.5 MG tablet Take 7.5 mg by mouth daily.    [provider]  metoprolol succinate (TOPROL-XL) 25 MG 24 hr tablet TAKE 1 TABLET (25 MG TOTAL) BY MOUTH DAILY. 05/01/17   [name], MD  MYRBETRIQ 25 MG TB24 tablet TAKE 1 TABLET BY MOUTH EVERY DAY 03/19/18   [name], MD  ondansetron (ZOFRAN) 4 MG tablet Take 1 tablet (4 mg total) by mouth 2 (two) times daily as needed for nausea or vomiting. 06/25/15   [name], MD  oxyCODONE (OXY IR/ROXICODONE) 5 MG immediate release tablet Take 5 mg by mouth 3 (three) times daily.     [provider]  pantoprazole (PROTONIX) 40 MG tablet Take 40 mg by mouth 2 (two) times daily.    [provider]  simvastatin (ZOCOR) 10 MG tablet TAKE 1 TABLET (10 MG TOTAL) BY MOUTH AT BEDTIME. 05/01/17   [name], MD  traMADol-acetaminophen (ULTRACET) 37.5-325 MG per tablet Take 1 tablet by mouth 2 (two) times daily as needed.     [provider]    Allergies Fentanyl  Family History  Problem Relation Age of Onset  . Breast cancer Mother   . Asthma Mother   . Stroke Father   . Hypertension Father   . Diabetes Father   . Ovarian cancer Sister        x2  . Prostate cancer Brother   . Hypertension Brother        x3  . Heart disease Brother   . Hypercholesterolemia Brother        x3  . Diabetes Brother   . Spina bifida Grandchild   . Hematuria Neg Hx   . Kidney cancer Neg Hx   . Kidney disease Neg Hx   . Sickle cell trait Neg Hx   . Tuberculosis Neg Hx     Social History Social History   Tobacco Use  . Smoking status: Never Smoker  . Smokeless tobacco: Never Used  Substance Use Topics  . Alcohol use: No    Alcohol/week: 0.0 standard drinks  . Drug use: No     Review of Systems  Constitutional: No fever/chills Eyes: No visual changes.  Cardiovascular: no chest  pain. Respiratory: no cough. No SOB. Gastrointestinal: No abdominal pain.  No nausea, no vomiting.   Musculoskeletal: Positive for lower back pain, left wrist pain Skin: Positive for skin tears to the left forearm Neurological: Negative for headaches, focal weakness or numbness. 10-point ROS otherwise negative.  ____________________________________________   PHYSICAL EXAM:  VITAL SIGNS: ED Triage Vitals  Enc Vitals Group     BP 04/15/18 1829 (!) 186/65     Pulse Rate 04/15/18 1827 62     Resp 04/15/18 1827 16     Temp 04/15/18 1827  98.5 F (36.9 C)     Temp Source 04/15/18 1827 Oral     SpO2 04/15/18 1827 97 %     Weight 04/15/18 1828 140 lb 14 oz (63.9 kg)     Height --      Head Circumference --      Peak Flow --      Pain Score 04/15/18 1828 0     Pain Loc --      Pain Edu? --      Excl. in [place]? --      Constitutional: Alert and oriented. Well appearing and in no acute distress. Eyes: Conjunctivae are normal. PERRL. EOMI. Head: Atraumatic. Neck: No stridor.    Cardiovascular: Normal rate, regular rhythm. Normal S1 and S2.  Good peripheral circulation. Respiratory: Normal respiratory effort without tachypnea or retractions. Lungs CTAB. Good air entry to the bases with no decreased or absent breath sounds. Musculoskeletal: Full range of motion to all extremities. No gross deformities appreciated.  Visualization of the lumbar spine reveals no visible signs of trauma.  Patient is tender to palpation over the lower lumbar region.  No palpable abnormality or step-off.  No tenderness to palpation of bilateral sciatic notches.  Dorsalis pedis pulse sensation intact distally.  Visualization of the left wrist reveals 2 linear skin tears both measuring approximately 5 cm in length.  No active bleeding.  No foreign body.  Edges are smooth but tissue is very fragile.  Patient is tender palpation over skin tears but denies any underlying tenderness to palpation.  Radial pulse intact.   Sensation intact all 5 digits. Neurologic:  Normal speech and language. No gross focal neurologic deficits are appreciated.  Skin:  Skin is warm, dry and intact. No rash noted. Psychiatric: Mood and affect are normal. Speech and behavior are normal. Patient exhibits appropriate insight and judgement.   ____________________________________________   LABS (all labs ordered are listed, but only abnormal results are displayed)  Labs Reviewed - No data to display ____________________________________________  EKG   ____________________________________________  RADIOLOGY I personally viewed and evaluated these images as part of my medical decision making, as well as reviewing the written report by the radiologist.  I concur with radiologist findings.  Dg Lumbar Spine Complete  Result Date: 04/15/2018 CLINICAL DATA:  Fall. EXAM: LUMBAR SPINE - COMPLETE 4+ VIEW COMPARISON:  CT 11/12/2017 FINDINGS: There is a marked scoliosis deformity of the lumbar spine which is convex towards the left. Multi level disc space narrowing and exuberant endplate spurring is noted at T12-L1, L1-2, L2-3, and L3-4. No fractures. Extensive aortic atherosclerosis. IMPRESSION: 1. Lumbar scoliosis and multilevel degenerative disc disease 2. No acute findings. Electronically Signed   By: Kerby Moors M.D.   On: 04/15/2018 20:30   Dg Pelvis 1-2 Views  Result Date: 04/15/2018 CLINICAL DATA:  Pain after fall EXAM: PELVIS - 1-2 VIEW COMPARISON:  None. FINDINGS: Scoliotic curvature of the lower lumbar spine. The sacrum is grossly intact. The iliac bones are intact. The proximal femurs are intact as well. IMPRESSION: No fractures noted.  Scoliosis of the lumbar spine. Electronically Signed   By: Dorise Bullion III M.D   On: 04/15/2018 20:28   Dg Wrist Complete Left  Result Date: 04/15/2018 CLINICAL DATA:  Pain after fall EXAM: LEFT WRIST - COMPLETE 3+ VIEW COMPARISON:  None. FINDINGS: Severe degenerative changes between the  base of the first metacarpal and the trapezium. No fracture or dislocation. IMPRESSION: No fracture or dislocation. Electronically Signed   By: Shanon Brow  Mee Hives M.D   On: 04/15/2018 20:29    ____________________________________________    PROCEDURES  Procedure(s) performed:    Marland KitchenMarland KitchenLaceration Repair Date/Time: 04/15/2018 8:24 PM Performed by: Darletta Moll, PA-C Authorized by: Darletta Moll, PA-C   Consent:    Consent obtained:  Verbal   Consent given by:  Patient   Risks discussed:  Pain and poor wound healing Anesthesia (see MAR for exact dosages):    Anesthesia method:  None Laceration details:    Location:  Shoulder/arm   Shoulder/arm location:  L lower arm   Length (cm):  2.5 Repair type:    Repair type:  Simple Exploration:    Hemostasis achieved with:  Direct pressure   Wound extent: no muscle damage noted, no nerve damage noted, no tendon damage noted, no underlying fracture noted and no vascular damage noted     Contaminated: no   Treatment:    Area cleansed with:  Shur-Clens   Amount of cleaning:  Standard Skin repair:    Repair method:  Steri-Strips   Number of Steri-Strips:  7 Approximation:    Approximation:  Close Post-procedure details:    Dressing:  Tube gauze   Patient tolerance of procedure:  Tolerated well, no immediate complications      Medications  Tdap (BOOSTRIX) injection 0.5 mL (0.5 mLs Intramuscular Given 04/15/18 2020)     ____________________________________________   INITIAL IMPRESSION / ASSESSMENT AND PLAN / ED COURSE  Pertinent labs & imaging results that were available during my care of the patient were reviewed by me and considered in my medical decision making (see chart for details).  Review of the [place] CSRS was performed in accordance of the [place] prior to dispensing any controlled drugs.      Patient's diagnosis is consistent with fall resulting in contusion of the lower back and skin tears of the left  forearm.  Patient presented to the emergency department after sustaining a fall.  She did not hit her head or lose consciousness.  Overall, exam is reassuring.  Skin tear is treated as described above.  Imaging was reassuring with no acute findings.  Tylenol at home for pain.  Follow-up with primary care as needed..  Patient is given ED precautions to return to the ED for any worsening or new symptoms.     ____________________________________________  FINAL CLINICAL IMPRESSION(S) / ED DIAGNOSES  Final diagnoses:  Fall, initial encounter  Contusion of lower back, initial encounter  Skin tear of left upper extremity      NEW MEDICATIONS STARTED DURING THIS VISIT:  ED Discharge Orders    None          This chart was dictated using voice recognition software/Dragon. Despite best efforts to proofread, errors can occur which can change the meaning. Any change was purely unintentional.    Darletta Moll, PA-C 04/15/18 2046    Harvest Dark, MD 04/15/18 2300

## 2018-04-15 NOTE — ED Notes (Addendum)
Pt presents today post fall with skin tear to the R Wrist. Pt states she is not on blood thinner and denies LOC

## 2018-04-15 NOTE — ED Notes (Signed)
Provider at bedside

## 2018-04-18 DIAGNOSIS — M5416 Radiculopathy, lumbar region: Secondary | ICD-10-CM | POA: Diagnosis not present

## 2018-04-18 DIAGNOSIS — M5136 Other intervertebral disc degeneration, lumbar region: Secondary | ICD-10-CM | POA: Diagnosis not present

## 2018-04-18 DIAGNOSIS — M48062 Spinal stenosis, lumbar region with neurogenic claudication: Secondary | ICD-10-CM | POA: Diagnosis not present

## 2018-04-19 ENCOUNTER — Other Ambulatory Visit: Payer: Self-pay | Admitting: Internal Medicine

## 2018-04-30 ENCOUNTER — Ambulatory Visit: Payer: Medicare HMO

## 2018-05-22 ENCOUNTER — Other Ambulatory Visit: Payer: Self-pay | Admitting: Internal Medicine

## 2018-05-22 MED ORDER — METOPROLOL SUCCINATE ER 25 MG PO TB24: 25.0000 mg | ORAL_TABLET | Freq: Every day | ORAL | 0 refills | Status: DC

## 2018-05-22 MED ORDER — AMLODIPINE BESYLATE 10 MG PO TABS: 10.0000 mg | ORAL_TABLET | Freq: Every day | ORAL | 0 refills | Status: DC

## 2018-05-22 NOTE — Telephone Encounter (Signed)
Requested Prescriptions  Pending Prescriptions Disp Refills  . amLODipine (NORVASC) 10 MG tablet 90 tablet 3    Sig: Take 1 tablet (10 mg total) by mouth daily.     Cardiovascular:  Calcium Channel Blockers Failed - 05/22/2018  8:26 AM      Failed - Last BP in normal range    BP Readings from Last 1 Encounters:  04/15/18 (!) 166/58         Passed - Valid encounter within last 6 months    Recent Outpatient Visits          1 month ago Urinary frequency   [facility] [place], [place], [place]   4 months ago Hyperglycemia   [facility] [place], [place] Patient, MD   8 months ago Encounter for screening mammogram for breast cancer   [facility] [place], [place], MD   11 months ago Iron deficiency anemia, unspecified iron deficiency anemia type   [facility] [place], Rand[place] Patient, MD   1 year ago Cold right foot   [facility] [place], [place] Patient, MD      Future Appointments            In 2 months [name], MD [facility]         . metoprolol succinate (TOPROL-XL) 25 MG 24 hr tablet 90 tablet 3    Sig: Take 1 tablet (25 mg total) by mouth daily.     Cardiovascular:  Beta Blockers Failed - 05/22/2018  8:26 AM      Failed - Last BP in normal range    BP Readings from Last 1 Encounters:  04/15/18 (!) 166/58         Passed - Last Heart Rate in normal range    Pulse Readings from Last 1 Encounters:  04/15/18 64         Passed - Valid encounter within last 6 months    Recent Outpatient Visits          1 month ago Urinary frequency   [facility] [place], [place], MD   4 months ago Hyperglycemia   [facility] [place], Rand[place] Patient, MD   8 months ago Encounter for screening mammogram for breast cancer   [facility] [place], [place] Patient, MD   11 months ago Iron deficiency anemia, unspecified iron deficiency anemia type   [facility] [place], [place] Patient, MD   1 year ago Cold right foot   [place], MD      Future Appointments            In 2 months [name], MD [facility]

## 2018-05-24 ENCOUNTER — Other Ambulatory Visit: Payer: Self-pay | Admitting: Internal Medicine

## 2018-06-07 ENCOUNTER — Emergency Department
Admission: EM | Admit: 2018-06-07 | Discharge: 2018-06-07 | Disposition: A | Discharge status: Home / Self Care | Payer: Medicare HMO | Attending: Emergency Medicine | Admitting: Emergency Medicine

## 2018-06-07 ENCOUNTER — Other Ambulatory Visit: Payer: Self-pay

## 2018-06-07 ENCOUNTER — Emergency Department: Payer: Medicare HMO

## 2018-06-07 DIAGNOSIS — R109 Unspecified abdominal pain: Secondary | ICD-10-CM

## 2018-06-07 DIAGNOSIS — K2270 Barrett's esophagus without dysplasia: Secondary | ICD-10-CM | POA: Insufficient documentation

## 2018-06-07 DIAGNOSIS — Z885 Allergy status to narcotic agent status: Secondary | ICD-10-CM | POA: Insufficient documentation

## 2018-06-07 DIAGNOSIS — R197 Diarrhea, unspecified: Secondary | ICD-10-CM | POA: Insufficient documentation

## 2018-06-07 DIAGNOSIS — K219 Gastro-esophageal reflux disease without esophagitis: Secondary | ICD-10-CM | POA: Insufficient documentation

## 2018-06-07 DIAGNOSIS — Z79899 Other long term (current) drug therapy: Secondary | ICD-10-CM | POA: Diagnosis not present

## 2018-06-07 DIAGNOSIS — Z791 Long term (current) use of non-steroidal anti-inflammatories (NSAID): Secondary | ICD-10-CM | POA: Insufficient documentation

## 2018-06-07 DIAGNOSIS — R1030 Lower abdominal pain, unspecified: Secondary | ICD-10-CM | POA: Diagnosis not present

## 2018-06-07 DIAGNOSIS — I10 Essential (primary) hypertension: Secondary | ICD-10-CM | POA: Diagnosis not present

## 2018-06-07 DIAGNOSIS — R339 Retention of urine, unspecified: Secondary | ICD-10-CM | POA: Insufficient documentation

## 2018-06-07 DIAGNOSIS — E785 Hyperlipidemia, unspecified: Secondary | ICD-10-CM | POA: Diagnosis not present

## 2018-06-07 DIAGNOSIS — Z85038 Personal history of other malignant neoplasm of large intestine: Secondary | ICD-10-CM | POA: Insufficient documentation

## 2018-06-07 DIAGNOSIS — R1084 Generalized abdominal pain: Secondary | ICD-10-CM | POA: Diagnosis not present

## 2018-06-07 DIAGNOSIS — R52 Pain, unspecified: Secondary | ICD-10-CM | POA: Diagnosis not present

## 2018-06-07 LAB — CBC WITH DIFFERENTIAL/PLATELET
Abs Immature Granulocytes: 0.03 10*3/uL (ref 0.00–0.07)
Basophils Absolute: 0.1 10*3/uL (ref 0.0–0.1)
Basophils Relative: 1 %
Eosinophils Absolute: 0.3 10*3/uL (ref 0.0–0.5)
Eosinophils Relative: 3 %
HCT: 41.9 % (ref 36.0–46.0)
Hemoglobin: 13.9 g/dL (ref 12.0–15.0)
Immature Granulocytes: 0 %
Lymphocytes Relative: 17 %
Lymphs Abs: 1.5 10*3/uL (ref 0.7–4.0)
MCH: 32.1 pg (ref 26.0–34.0)
MCHC: 33.2 g/dL (ref 30.0–36.0)
MCV: 96.8 fL (ref 80.0–100.0)
Monocytes Absolute: 0.6 10*3/uL (ref 0.1–1.0)
Monocytes Relative: 7 %
Neutro Abs: 6.3 10*3/uL (ref 1.7–7.7)
Neutrophils Relative %: 72 %
Platelets: 253 10*3/uL (ref 150–400)
RBC: 4.33 MIL/uL (ref 3.87–5.11)
RDW: 12.6 % (ref 11.5–15.5)
WBC: 8.9 10*3/uL (ref 4.0–10.5)
nRBC: 0 % (ref 0.0–0.2)

## 2018-06-07 LAB — C DIFFICILE QUICK SCREEN W PCR REFLEX
C Diff antigen: NEGATIVE
C Diff interpretation: NOT DETECTED
C Diff toxin: NEGATIVE

## 2018-06-07 LAB — GASTROINTESTINAL PANEL BY PCR, STOOL (REPLACES STOOL CULTURE)

## 2018-06-07 LAB — URINALYSIS, COMPLETE (UACMP) WITH MICROSCOPIC
Bilirubin Urine: NEGATIVE
Glucose, UA: NEGATIVE mg/dL
Hgb urine dipstick: NEGATIVE
Ketones, ur: NEGATIVE mg/dL
Leukocytes,Ua: NEGATIVE
Nitrite: NEGATIVE
Protein, ur: NEGATIVE mg/dL
Specific Gravity, Urine: 1.006 (ref 1.005–1.030)
Squamous Epithelial / HPF: NONE SEEN (ref 0–5)
pH: 6 (ref 5.0–8.0)

## 2018-06-07 LAB — COMPREHENSIVE METABOLIC PANEL
ALT: 13 U/L (ref 0–44)
AST: 24 U/L (ref 15–41)
Albumin: 4.5 g/dL (ref 3.5–5.0)
Alkaline Phosphatase: 66 U/L (ref 38–126)
Anion gap: 13 (ref 5–15)
BUN: 23 mg/dL (ref 8–23)
CO2: 23 mmol/L (ref 22–32)
Calcium: 10.3 mg/dL (ref 8.9–10.3)
Chloride: 101 mmol/L (ref 98–111)
Creatinine, Ser: 0.9 mg/dL (ref 0.44–1.00)
GFR calc Af Amer: >60 mL/min (ref >60)
GFR calc non Af Amer: 57 mL/min — ABNORMAL LOW (ref >60)
Glucose, Bld: 110 mg/dL — ABNORMAL HIGH (ref 70–99)
Potassium: 4.4 mmol/L (ref 3.5–5.1)
Sodium: 137 mmol/L (ref 135–145)
Total Bilirubin: 1.2 mg/dL (ref 0.3–1.2)
Total Protein: 7.5 g/dL (ref 6.5–8.1)

## 2018-06-07 LAB — LACTIC ACID, PLASMA: Lactic Acid, Venous: 1.3 mmol/L (ref 0.5–1.9)

## 2018-06-07 MED ORDER — IOHEXOL 240 MG/ML SOLN
50.0000 mL | Freq: Once | INTRAMUSCULAR | Status: AC | PRN
  Administered 2018-06-07: 50 mL via ORAL

## 2018-06-07 MED ORDER — IOHEXOL 300 MG/ML  SOLN
75.0000 mL | Freq: Once | INTRAMUSCULAR | Status: AC | PRN
  Administered 2018-06-07: 75 mL via INTRAVENOUS

## 2018-06-07 MED ORDER — SODIUM CHLORIDE 0.9 % IV BOLUS
500.0000 mL | Freq: Once | INTRAVENOUS | Status: AC
  Administered 2018-06-07: 10:00:00 500 mL via INTRAVENOUS

## 2018-06-07 NOTE — ED Provider Notes (Addendum)
[facility] Emergency Department Provider Note  ____________________________________________   I have reviewed the triage vital signs and the nursing notes. Where available I have reviewed prior notes and, if possible and indicated, outside hospital notes.    HISTORY  Chief Complaint Abdominal Pain    HPI [name] is a 85 y.o. female  Reports a gradual onset of lower abdominal pain this morning.  Does have a history of bowel obstructions in the past status post adhesion resection remote history of cancer also history of diverticulitis.  She states that she has had no vomiting fever diarrhea.  No melena no bright red blood per rectum.  Pain is a crampy lower abdominal discomfort which is constant.  Nothing makes it better nothing makes it worse no other alleviating or aggravating symptoms no other prior symptoms.  No other prior interventions declines pain medications at this time will let me know if she wants pain medications.  Not nauseated at this moment.  Pain is without radiation.  Is across the entire lower abdomen.   Past Medical History:  Diagnosis Date  . Anemia   . Barrett's esophagus   . Bowel obstruction (HCC)    s/p adhesion resection  . Chronic back pain   . Colon cancer ([place]) 1988 and 1989   adenocarcinoma, s/p resection x  2 and chemo  . Diverticulitis   . GERD (gastroesophageal reflux disease)   . H/O open leg wound   . Hiatal hernia   . Hypercholesteremia   . Hypertension   . Lumbar scoliosis   . OA (osteoarthritis)   . Peripheral neuropathy   . Recurrent sinus infections   . Renal cyst   . S/P chemotherapy, time since greater than 12 weeks    colon cancer  . Vertigo     Patient Active Problem List   Diagnosis Date Noted  . PAD (peripheral artery disease) ([place]) 04/07/2018  . Cold foot 05/15/2017  . Constipation 08/15/2016  . Small bowel obstruction ([place]) 11/24/2015  . Swelling of left lower extremity 07/16/2015  .  Acute Ileitis 06/12/2015  . Hypokalemia 06/12/2015  . Accelerated hypertension 06/12/2015  . Hereditary hemochromatosis ([place]) 05/25/2015  . Leg wound, right 05/19/2015  . Bilateral lower extremity edema 05/19/2015  . Iron excess 04/16/2015  . Dizziness 10/26/2014  . Hyperbilirubinemia 10/26/2014  . Health care maintenance 05/18/2014  . Anemia, iron deficiency 04/21/2014  . Hospital discharge follow-up 04/14/2014  . Hot flashes 03/07/2014  . Face lesion 11/03/2013  . Numbness in feet 10/08/2012  . Nocturia 10/08/2012  . Hypertension 12/18/2011  . Hypercholesteremia 12/18/2011  . History of colon cancer 12/18/2011  . Barrett's esophagus 12/18/2011  . Chronic back pain 12/18/2011  . Osteopenia 12/18/2011    Past Surgical History:  Procedure Laterality Date  . ABDOMINAL HYSTERECTOMY  1982  . adhesions resected     bowel obstruction  . APPENDECTOMY    . BREAST BIOPSY Left    negative 06/14/1985  . CHOLECYSTECTOMY  1994  . COLON RESECTION     x2.  s/p colon cancer  . [place]  . EXCISIONAL HEMORRHOIDECTOMY     with tubal ligation  . EXPLORATORY LAPAROTOMY  1991   Secondary to SBO    Prior to Admission medications   Medication Sig Start Date End Date Taking? Authorizing Provider  amLODipine (NORVASC) 10 MG tablet TAKE 1 TABLET EVERY DAY 05/28/18   [name], MD  Calcium Carbonate-Vitamin  D (CALCIUM 600 + D PO) Take 600 Units by mouth 2 (two) times daily with a meal.     [provider]  fexofenadine (ALLEGRA) 60 MG tablet Take 1 tablet (60 mg total) by mouth daily. 12/04/17   [name], MD  irbesartan (AVAPRO) 75 MG tablet TAKE 1 TABLET EVERY DAY 04/19/18   [name], MD  meclizine (ANTIVERT) 25 MG tablet Take 25 mg by mouth 3 (three) times daily as needed.    [provider]  meloxicam (MOBIC) 7.5 MG tablet Take 7.5 mg by mouth daily.    [provider]  metoprolol succinate (TOPROL-XL) 25 MG  24 hr tablet Take 1 tablet (25 mg total) by mouth daily. 05/22/18   [name], MD  MYRBETRIQ 25 MG TB24 tablet TAKE 1 TABLET BY MOUTH EVERY DAY 03/19/18   [name], MD  ondansetron (ZOFRAN) 4 MG tablet Take 1 tablet (4 mg total) by mouth 2 (two) times daily as needed for nausea or vomiting. 06/25/15   [name], MD  oxybutynin (DITROPAN) 5 MG tablet TAKE 1 TABLET TWICE DAILY 04/19/18   [name], MD  oxyCODONE (OXY IR/ROXICODONE) 5 MG immediate release tablet Take 5 mg by mouth 3 (three) times daily.     [provider]  pantoprazole (PROTONIX) 40 MG tablet Take 40 mg by mouth 2 (two) times daily.    [provider]  simvastatin (ZOCOR) 10 MG tablet TAKE 1 TABLET (10 MG TOTAL) BY MOUTH AT BEDTIME. 05/01/17   [name], MD  traMADol-acetaminophen (ULTRACET) 37.5-325 MG per tablet Take 1 tablet by mouth 2 (two) times daily as needed.     [provider]    Allergies Fentanyl  Family History  Problem Relation Age of Onset  . Breast cancer Mother   . Asthma Mother   . Stroke Father   . Hypertension Father   . Diabetes Father   . Ovarian cancer Sister        x2  . Prostate cancer Brother   . Hypertension Brother        x3  . Heart disease Brother   . Hypercholesterolemia Brother        x3  . Diabetes Brother   . Spina bifida Grandchild   . Hematuria Neg Hx   . Kidney cancer Neg Hx   . Kidney disease Neg Hx   . Sickle cell trait Neg Hx   . Tuberculosis Neg Hx     Social History Social History   Tobacco Use  . Smoking status: Never Smoker  . Smokeless tobacco: Never Used  Substance Use Topics  . Alcohol use: No    Alcohol/week: 0.0 standard drinks  . Drug use: No    Review of Systems Constitutional: No fever/chills Eyes: No visual changes. ENT: No sore throat. No stiff neck no neck pain Cardiovascular: Denies chest pain. Respiratory: Denies shortness of breath. Gastrointestinal:   no vomiting.  No diarrhea.  No  constipation. Genitourinary: Negative for dysuria. Musculoskeletal: Negative lower extremity swelling Skin: Negative for rash. Neurological: Negative for severe headaches, focal weakness or numbness.   ____________________________________________   PHYSICAL EXAM:  VITAL SIGNS: ED Triage Vitals  Enc Vitals Group     BP 06/07/18 0918 (!) 196/61     Pulse Rate 06/07/18 0918 62     Resp --      Temp 06/07/18 0918 98.9 F (37.2 C)     Temp Source 06/07/18 0918 Oral     SpO2 06/07/18  0918 96 %     Weight 06/07/18 0919 140 lb (63.5 kg)     Height 06/07/18 0919 5\' 3"  (1.6 m)     Head Circumference --      Peak Flow --      Pain Score 06/07/18 0919 0     Pain Loc --      Pain Edu? --      Excl. in [place]? --     Constitutional: Alert and oriented. Well appearing and in no acute distress. Eyes: Conjunctivae are normal Head: Atraumatic HEENT: No congestion/rhinnorhea. Mucous membranes are moist.  Oropharynx non-erythematous Neck:   Nontender with no meningismus, no masses, no stridor Cardiovascular: Normal rate, regular rhythm. Grossly normal heart sounds.  Good peripheral circulation. Respiratory: Normal respiratory effort.  No retractions. Lungs CTAB. Abdominal: Soft and has palpation lower abdomen. No distention.  No guarding no rebound. Back:  There is no focal tenderness or step off.  there is no midline tenderness there are no lesions noted. there is no CVA tenderness  Musculoskeletal: No lower extremity tenderness, no upper extremity tenderness. No joint effusions, no DVT signs strong distal pulses no edema Neurologic:  Normal speech and language. No gross focal neurologic deficits are appreciated.  Skin:  Skin is warm, dry and intact. No rash noted. Psychiatric: Mood and affect are normal. Speech and behavior are normal.  ____________________________________________   LABS (all labs ordered are listed, but only abnormal results are displayed)  Labs Reviewed  CBC WITH  DIFFERENTIAL/PLATELET  URINALYSIS, COMPLETE (UACMP) WITH MICROSCOPIC  LACTIC ACID, PLASMA  LACTIC ACID, PLASMA  COMPREHENSIVE METABOLIC PANEL    Pertinent labs  results that were available during my care of the patient were reviewed by me and considered in my medical decision making (see chart for details). ____________________________________________  EKG  I personally interpreted any EKGs ordered by me or triage Sinus rhythm rate 56 bpm, mild bradycardia noted.  No acute ST elevation or depression, LAD. ____________________________________________  RADIOLOGY  Pertinent labs & imaging results that were available during my care of the patient were reviewed by me and considered in my medical decision making (see chart for details). If possible, patient and/or family made aware of any abnormal findings.  No results found. ____________________________________________    PROCEDURES  Procedure(s) performed: None  Procedures  Critical Care performed: None  ____________________________________________   INITIAL IMPRESSION / ASSESSMENT AND PLAN / ED COURSE  Pertinent labs & imaging results that were available during my care of the patient were reviewed by me and considered in my medical decision making (see chart for details).  With a complicated past medical history with lower abdominal discomfort, differential includes obstruction diverticulitis appendicitis among other things.  Patient not known to have had an appendectomy.  However, does not focal right lower quadrant abdominal pain.  Given her complicated abdominal surgery history, we will obtain CT scan, and reassess ischemic gut is also considered.  Patient does not want pain medications at this time.  Blood pressure somewhat elevated which we will monitor closely.  ----------------------------------------- 11:47 AM on 06/07/2018 -----------------------------------------  Patient has several episodes of diarrhea here.   Abdomen nonsurgical at this time CT scan reassuring.  CT scan shows something of a distended bladder and her pain is largely suprapubic we will do a bladder scan to see if she has urinary retention after urinating here.  We have sent fecal stool sample tests.  And we will continue to reassess.  Blood work is reassuring lactic is  negative.  ----------------------------------------- 1:59 PM on 06/07/2018 -----------------------------------------  Patient did have acute urinary retention but she is neurologically intact with no evidence of neurogenic bladder, I did suggest admission to the hospital, she has no evidence of C. difficile or acute gastrointestinal infection she has been taking a lot of laxatives.  It is unclear exactly why she had urinary retention, he was constipated before this she now remembers.  Certainly that can happen..  At this time, there is no evidence of urinary tract infection her blood work is fine her belly is completely benign after the placement of the Foley and we will try to get her home at her request.  She was seen by the hospitalist for admission but she declined admission and it was their feeling that she can go home.  I appreciate the consult.  ----------------------------------------- 2:27 PM on 06/07/2018 ----------------------------------------- Discussed with Dr. Bevelyn Ngo, he and I reviewed the CT scans of her blood work etc., he feels that he agrees with a Foley catheter and close outpatient follow-up.  No indication for antibiotics or admission at this time. Much appreciate consult patient is eager to go   ____________________________________________   FINAL CLINICAL IMPRESSION(S) / ED DIAGNOSES  Final diagnoses:  None      This chart was dictated using voice recognition software.  Despite best efforts to proofread,  errors can occur which can change meaning.      Schuyler Amor, MD 06/07/18 3559    Schuyler Amor, MD 06/07/18 1148     Schuyler Amor, MD 06/07/18 1400    Schuyler Amor, MD 06/07/18 [phone]

## 2018-06-07 NOTE — ED Notes (Signed)
Pt now on bedpan having 3 diarrhea/loose stool since arrival to ED. Dr Burlene Arnt aware.

## 2018-06-07 NOTE — ED Notes (Signed)
Pt reports she "feels full" when drinking contrast, Dr. Burlene Arnt notified, ok to d/c contrast, CT informed

## 2018-06-07 NOTE — Discharge Instructions (Addendum)
Return to the emergency room for any new or worrisome symptoms including fever vomiting abdominal pain, persistent diarrhea or constipation or if you change your mind about admission.

## 2018-06-07 NOTE — Consult Note (Signed)
[facility] [place] NAME: [name]    MR#:  [id number]  DATE OF BIRTH:  Nov 10, 1928  DATE OF ADMISSION:  06/07/2018  PRIMARY CARE PHYSICIAN: [name], MD   CONSULT REQUESTING/REFERRING PHYSICIAN: Dr. Burlene Arnt  REASON FOR CONSULT: Urinary retention and diarrhea with abd pain  CHIEF COMPLAINT:   Chief Complaint  Patient presents with  . Abdominal Pain    HISTORY OF PRESENT ILLNESS:  [name]  is a 85 y.o. female with a known history of hypertension, hyperlipidemia, hiatal hernia, diverticulitis, colon cancer and bowel obstruction in the past presents to the emergency room complaining of again onset of lower abdominal pain and tenderness since 3 AM.  Patient initially was straining to have bowel movement twice.  Did not have a bowel movement.  Was unable to void.  Here in the emergency room patient with her pain and history of small bowel obstruction had a CT scan of the abdomen and pelvis which showed no bowel obstruction but did show a significantly distended urinary bladder with fullness of collecting system.  No signs of infection.  Normal WBC and afebrile.  No acute kidney injury.  Normal electrolytes.  Patient did have 3-4 loose bowel movements here in the emergency room when she was trying to drink the contrast.  She does take senna twice a day at home along with Metamucil.  Does not have diarrhea with these medications normally.  Presently patient had a Foley catheter placed with 1 L urine drained.  Her abdominal pain is almost resolved.  No vomiting. C. difficile checked and negative.  Stool PCR pending. Patient had urinary outlet obstruction in the past during hospitalization according to the patient but had Foley removed and did not go home with it.  PAST MEDICAL HISTORY:   Past Medical History:  Diagnosis Date  . Anemia   . Barrett's esophagus   . Bowel obstruction (HCC)    s/p adhesion resection  . Chronic back pain    . Colon cancer ([place]) 1988 and 1989   adenocarcinoma, s/p resection x  2 and chemo  . Diverticulitis   . GERD (gastroesophageal reflux disease)   . H/O open leg wound   . Hiatal hernia   . Hypercholesteremia   . Hypertension   . Lumbar scoliosis   . OA (osteoarthritis)   . Peripheral neuropathy   . Recurrent sinus infections   . Renal cyst   . S/P chemotherapy, time since greater than 12 weeks    colon cancer  . Vertigo     PAST SURGICAL HISTOIRY:   Past Surgical History:  Procedure Laterality Date  . ABDOMINAL HYSTERECTOMY  1982  . adhesions resected     bowel obstruction  . APPENDECTOMY    . BREAST BIOPSY Left    negative 06/14/1985  . CHOLECYSTECTOMY  1994  . COLON RESECTION     x2.  s/p colon cancer  . [place]  . EXCISIONAL HEMORRHOIDECTOMY     with tubal ligation  . EXPLORATORY LAPAROTOMY  1991   Secondary to SBO    SOCIAL HISTORY:   Social History   Tobacco Use  . Smoking status: Never Smoker  . Smokeless tobacco: Never Used  Substance Use Topics  . Alcohol use: No    Alcohol/week: 0.0 standard drinks    FAMILY HISTORY:   Family History  Problem Relation Age of Onset  . Breast cancer Mother   .  Asthma Mother   . Stroke Father   . Hypertension Father   . Diabetes Father   . Ovarian cancer Sister        x2  . Prostate cancer Brother   . Hypertension Brother        x3  . Heart disease Brother   . Hypercholesterolemia Brother        x3  . Diabetes Brother   . Spina bifida Grandchild   . Hematuria Neg Hx   . Kidney cancer Neg Hx   . Kidney disease Neg Hx   . Sickle cell trait Neg Hx   . Tuberculosis Neg Hx     DRUG ALLERGIES:   Allergies  Allergen Reactions  . Fentanyl Other (See Comments)    confusion    REVIEW OF SYSTEMS:   ROS  CONSTITUTIONAL: No fever, fatigue or weakness.  EYES: No blurred or double vision.  EARS, NOSE, AND THROAT: No tinnitus or ear pain.  RESPIRATORY: No cough,  shortness of breath, wheezing or hemoptysis.  CARDIOVASCULAR: No chest pain, orthopnea, edema.  GASTROINTESTINAL:  diarrhea, abdominal pain, urinary retention GENITOURINARY: No dysuria, hematuria.  ENDOCRINE: No polyuria, nocturia,  HEMATOLOGY: No anemia, easy bruising or bleeding SKIN: No rash or lesion. MUSCULOSKELETAL: No joint pain or arthritis.   NEUROLOGIC: No tingling, numbness, weakness.  PSYCHIATRY: No anxiety or depression.   MEDICATIONS AT HOME:   Prior to Admission medications   Medication Sig Start Date End Date Taking? Authorizing Provider  amLODipine (NORVASC) 10 MG tablet TAKE 1 TABLET EVERY DAY 05/28/18  Yes [name], MD  Calcium Carbonate-Vitamin D (CALCIUM 600 + D PO) Take 600 Units by mouth 2 (two) times daily with a meal.    Yes [provider]  fexofenadine (ALLEGRA) 60 MG tablet Take 1 tablet (60 mg total) by mouth daily. 12/04/17  Yes [name], MD  irbesartan (AVAPRO) 75 MG tablet TAKE 1 TABLET EVERY DAY 04/19/18  Yes [name], MD  meclizine (ANTIVERT) 25 MG tablet Take 25 mg by mouth 3 (three) times daily as needed.   Yes [provider]  meloxicam (MOBIC) 7.5 MG tablet Take 7.5 mg by mouth daily.   Yes [provider]  metoprolol succinate (TOPROL-XL) 25 MG 24 hr tablet Take 1 tablet (25 mg total) by mouth daily. 05/22/18  Yes [name], MD  ondansetron (ZOFRAN) 4 MG tablet Take 1 tablet (4 mg total) by mouth 2 (two) times daily as needed for nausea or vomiting. 06/25/15  Yes [name], MD  oxybutynin (DITROPAN) 5 MG tablet TAKE 1 TABLET TWICE DAILY 04/19/18  Yes [name], MD  oxyCODONE (OXY IR/ROXICODONE) 5 MG immediate release tablet Take 5 mg by mouth 3 (three) times daily.    Yes [provider]  pantoprazole (PROTONIX) 40 MG tablet Take 40 mg by mouth 2 (two) times daily.   Yes [provider]  simvastatin (ZOCOR) 10 MG tablet TAKE 1 TABLET (10 MG TOTAL) BY MOUTH AT BEDTIME. 05/01/17   Yes [name], MD  traMADol-acetaminophen (ULTRACET) 37.5-325 MG per tablet Take 1 tablet by mouth 2 (two) times daily as needed.    Yes [provider]      VITAL SIGNS:  Blood pressure (!) 157/61, pulse 77, temperature 98.9 F (37.2 C), temperature source Oral, resp. rate 11, height 5\' 3"  (1.6 m), weight 63.5 kg, SpO2 94 %.  PHYSICAL EXAMINATION:  GENERAL:  85 y.o.-year-old patient lying in the bed with no acute distress.  EYES: Pupils  equal, round, reactive to light and accommodation. No scleral icterus. Extraocular muscles intact.  HEENT: Head atraumatic, normocephalic. Oropharynx and nasopharynx clear.  NECK:  Supple, no jugular venous distention. No thyroid enlargement, no tenderness.  LUNGS: Normal breath sounds bilaterally, no wheezing, rales,rhonchi or crepitation. No use of accessory muscles of respiration.  CARDIOVASCULAR: S1, S2 normal. No murmurs, rubs, or gallops.  ABDOMEN: Soft, suprapubic tenderness, mild, nondistended. Bowel sounds present. No organomegaly or mass.  EXTREMITIES: No pedal edema, cyanosis, or clubbing.  NEUROLOGIC: Cranial nerves II through XII are intact. Muscle strength 5/5 in all extremities. Sensation intact. Gait not checked.  PSYCHIATRIC: The patient is alert and oriented x 3.  SKIN: No obvious rash, lesion, or ulcer.   LABORATORY PANEL:   CBC Recent Labs  Lab 06/07/18 0944  WBC 8.9  HGB 13.9  HCT 41.9  PLT 253   ------------------------------------------------------------------------------------------------------------------  Chemistries  Recent Labs  Lab 06/07/18 0944  NA 137  K 4.4  CL 101  CO2 23  GLUCOSE 110*  BUN 23  CREATININE 0.90  CALCIUM 10.3  AST 24  ALT 13  ALKPHOS 66  BILITOT 1.2   ------------------------------------------------------------------------------------------------------------------  Cardiac Enzymes No results for input(s): TROPONINI in the last 168  hours. ------------------------------------------------------------------------------------------------------------------  RADIOLOGY:  Ct Abdomen Pelvis W Contrast  Result Date: 06/07/2018 CLINICAL DATA:  Lower abdominal pain beginning this morning. EXAM: CT ABDOMEN AND PELVIS WITH CONTRAST TECHNIQUE: Multidetector CT imaging of the abdomen and pelvis was performed using the standard protocol following bolus administration of intravenous contrast. CONTRAST:  75 mL OMNIPAQUE IOHEXOL 300 MG/ML  SOLN COMPARISON:  CT abdomen and pelvis 11/12/2017. FINDINGS: Lower chest: No pleural or pericardial effusion. There is mild dependent atelectasis in the left base. Hepatobiliary: No focal lesion. Status post cholecystectomy. Mild intra and extrahepatic biliary ductal prominence is unchanged and likely due to the patient's age and cholecystectomy. Pancreas: Unremarkable. No pancreatic ductal dilatation or surrounding inflammatory changes. Single calcification in the body of the pancreas is unchanged and likely due to some prior inflammatory process. Spleen: Normal in size. Punctate calcifications consistent with old granulomatous disease noted. Adrenals/Urinary Tract: Cyst upper pole left kidney is unchanged. Mild fullness of the renal collecting systems bilaterally is likely related to distention of the urinary bladder. The kidneys are otherwise unremarkable. The adrenal glands appear normal. Stomach/Bowel: The patient is status post right hemicolectomy, unchanged. No evidence of bowel obstruction or inflammatory change. Hiatal hernia noted. Vascular/Lymphatic: Aortic atherosclerosis. No enlarged abdominal or pelvic lymph nodes. Reproductive: Status post hysterectomy. No adnexal masses. Other: None. Musculoskeletal: No acute or focal bony abnormality. Severe convex left thoracolumbar scoliosis and multilevel degenerative disease noted. IMPRESSION: No acute abnormality chest or abdomen. Moderate hiatal hernia. Mild  fullness of the intrarenal collecting systems bilaterally is likely secondary to distention of the urinary bladder which is presumably incidental. Atherosclerosis. Electronically Signed   By: Inge Rise M.D.   On: 06/07/2018 11:40    EKG:   Orders placed or performed during the hospital encounter of 11/12/17  . EKG 12-Lead  . EKG 12-Lead  . EKG    IMPRESSION AND PLAN:   *Urinary retention of unknown etiology.  No organic lesions on patient's CT scan of the abdomen and pelvis.  Foley catheter placed.  Will need outpatient urology follow-up.  No signs of urinary infection.  Needs to be discharged with Foley.  Abdominal pain has resolved completely once her bladder distention has resolved.  *Diarrhea.  3 episodes in the emergency room.  Likely  due to constipation from the bladder distention.  At this time patient feels well.  Is requesting to be discharged home.  C. difficile negative.  Afebrile.  Normal WBC.  No abdominal tenderness.  No blood in stool.  No signs of infection  *Hypertension is stable  Advised patient to keep herself well-hydrated.  Monitor for fever, blood in stool or worsening abdominal pain and return if these occur or any way worsening of symptoms.  Discussed with ER physician Dr. Burlene Arnt.  Patient can be discharged home.  All the records are reviewed and case discussed with Consulting provider. Management plans discussed with the patient, family and they are in agreement.  TOTAL TIME TAKING CARE OF THIS PATIENT: 40 minutes.   [name] M.D on 06/07/2018 at 1:14 PM  Between 7am to 6pm - Pager - [phone]  After 6pm go to [email or web address] - password [email or web address] [place] [facility]  Office  [phone]  CC: Primary care Physician: [name], MD  Note: This dictation was prepared with Dragon dictation along with smaller phrase technology. Any transcriptional errors that result from this process are unintentional.

## 2018-06-07 NOTE — ED Notes (Signed)
Called and updated daughter [name] with patients verbal permission.

## 2018-06-07 NOTE — ED Notes (Signed)
Pt via [facility] for abdominal pain that started around 3am, no c/o pain at this time, no SOB, afebrile

## 2018-06-08 LAB — URINE CULTURE: Culture: NO GROWTH

## 2018-06-14 ENCOUNTER — Other Ambulatory Visit: Payer: Self-pay

## 2018-06-14 ENCOUNTER — Ambulatory Visit: Payer: Medicare HMO | Admitting: Urology

## 2018-06-14 ENCOUNTER — Encounter: Payer: Self-pay | Admitting: Urology

## 2018-06-14 VITALS — BP 177/71 | HR 57 | Ht 63.0 in | Wt 140.0 lb

## 2018-06-14 DIAGNOSIS — R339 Retention of urine, unspecified: Secondary | ICD-10-CM | POA: Diagnosis not present

## 2018-06-14 LAB — BLADDER SCAN AMB NON-IMAGING

## 2018-06-14 NOTE — Progress Notes (Signed)
   06/14/2018 10:45 AM   [name] 1929/01/17 [id number]  Reason for visit: Urinary retention  HPI: I saw [name] in follow-up after being seen in the emergency department on 06/07/2018 for urinary retention.  She was previously seen by Zara Council, PA in November 2018 for nocturia.  Briefly, she is an 85 year old female that presented to the emergency department on 06/07/2018 with constipation, abdominal pain, and urinary retention based on CT scan.  CT showed a distended bladder with bilateral mild hydronephrosis.  Renal function was normal, and urine culture was negative.  She denies any feeling of incomplete emptying or straining with urination.  She was given a bowel regimen, and a Foley catheter was placed.  Her abdominal pain has resolved.  She denies any flank pain.  She denies any history of hematuria or urinary tract infections.  Her primary complaint prior to her ER visit was nocturia 3-5 times per night.  She denies any significant urinary incontinence.  She is previously trialed Myrbetriq 25 mg daily for urinary frequency and nocturia, but had minimal improvement.  Notably, she has been on oxybutynin prescribed by her primary care physician.  She also takes chronic narcotics for pain.   ROS: Please see flowsheet from today's date for complete review of systems.  Physical Exam: BP (!) 177/71   Pulse (!) 57   Ht 5\' 3"  (1.6 m)   Wt 140 lb (63.5 kg)   BMI 24.80 kg/m    Constitutional: Wheelchair-bound, frail-appearing Respiratory: Normal respiratory effort, no increased work of breathing. GI: Abdomen is soft, nontender, nondistended, no abdominal masses GU: No CVA tenderness Skin: No rashes, bruises or suspicious lesions. Neurologic: Grossly intact, no focal deficits, moving all 4 extremities. Psychiatric: Normal mood and affect  Laboratory Data: Reviewed  Pertinent Imaging: I have personally reviewed the CT dated 06/07/2018.  Distended bladder with bilateral  mild hydronephrosis  Assessment & Plan:   In summary, [name] is an 85 year old frail female recently seen in the emergency department with urinary retention, likely secondary to constipation and oxybutynin use.  I had a very long conversation with the patient and her daughter about her urinary symptoms, and impact of narcotics, oxybutynin, and constipation on urinary retention.  I strongly recommended discontinuing the oxybutynin, in the setting of ongoing constipation, urinary retention, and especially with her high risk for confusion or falls secondary to anticholinergic.  We discussed strategies to reduce her nocturia including minimizing fluids in the evening, putting her legs up in the mid afternoon to reduce lower extremity edema, and double voiding prior to bed.  Foley was removed and she will return this afternoon for post void residual.  Follow-up in 4 to 6 weeks for post void residual, consider re-trial of Myrbetriq 50 mg daily at that time if severe OAB symptoms  A total of 25 minutes were spent face-to-face with the patient, greater than 50% was spent in patient education, counseling, and coordination of care regarding urinary symptoms, etiology of urinary retention, and side effects of anticholinergics.   Billey Co, [facility] [address], [place], [place] [address] [phone]

## 2018-06-15 NOTE — Progress Notes (Signed)
Fill and Pull Catheter Removal  Patient is present today for a catheter removal.  Patient was cleaned and prepped in a sterile fashion 144ml of sterile water/ saline was instilled into the bladder when the patient felt the urge to urinate. 61ml of water was then drained from the balloon.  A 16FR foley cath was removed from the bladder no complications were noted .  Patient as then given some time to void on their own.  Patient can void  15ml on their own after some time.  Patient was instructed to return later in the day for PVR and hydrate.  Preformed by: Blondell Reveal  Follow up/ Additional notes: Return in afternoon for PVR  Patient returned for PVR this afternoon 64ml Patient to follow up in 4-6 weeks per Dr. Diamantina Providence

## 2018-06-26 DIAGNOSIS — M62830 Muscle spasm of back: Secondary | ICD-10-CM | POA: Diagnosis not present

## 2018-06-26 DIAGNOSIS — M7062 Trochanteric bursitis, left hip: Secondary | ICD-10-CM | POA: Diagnosis not present

## 2018-06-26 DIAGNOSIS — M48062 Spinal stenosis, lumbar region with neurogenic claudication: Secondary | ICD-10-CM | POA: Diagnosis not present

## 2018-06-26 DIAGNOSIS — M5136 Other intervertebral disc degeneration, lumbar region: Secondary | ICD-10-CM | POA: Diagnosis not present

## 2018-06-26 DIAGNOSIS — M5416 Radiculopathy, lumbar region: Secondary | ICD-10-CM | POA: Diagnosis not present

## 2018-06-30 ENCOUNTER — Other Ambulatory Visit: Payer: Self-pay | Admitting: Internal Medicine

## 2018-07-17 ENCOUNTER — Other Ambulatory Visit: Payer: Self-pay

## 2018-07-17 ENCOUNTER — Other Ambulatory Visit (INDEPENDENT_AMBULATORY_CARE_PROVIDER_SITE_OTHER): Payer: Medicare HMO

## 2018-07-17 DIAGNOSIS — R739 Hyperglycemia, unspecified: Secondary | ICD-10-CM | POA: Diagnosis not present

## 2018-07-17 DIAGNOSIS — I10 Essential (primary) hypertension: Secondary | ICD-10-CM

## 2018-07-17 DIAGNOSIS — E7800 Pure hypercholesterolemia, unspecified: Secondary | ICD-10-CM | POA: Diagnosis not present

## 2018-07-17 LAB — HEPATIC FUNCTION PANEL
ALT: 11 U/L (ref 0–35)
AST: 15 U/L (ref 0–37)
Albumin: 4.2 g/dL (ref 3.5–5.2)
Alkaline Phosphatase: 57 U/L (ref 39–117)
Bilirubin, Direct: 0.2 mg/dL (ref 0.0–0.3)
Total Bilirubin: 1.1 mg/dL (ref 0.2–1.2)
Total Protein: 6.4 g/dL (ref 6.0–8.3)

## 2018-07-17 LAB — BASIC METABOLIC PANEL
BUN: 24 mg/dL — ABNORMAL HIGH (ref 6–23)
CO2: 30 mEq/L (ref 19–32)
Calcium: 9.6 mg/dL (ref 8.4–10.5)
Chloride: 101 mEq/L (ref 96–112)
Creatinine, Ser: 0.92 mg/dL (ref 0.40–1.20)
GFR: 57.68 mL/min — ABNORMAL LOW (ref >60.00)
Glucose, Bld: 94 mg/dL (ref 70–99)
Potassium: 4 mEq/L (ref 3.5–5.1)
Sodium: 137 mEq/L (ref 135–145)

## 2018-07-17 LAB — LIPID PANEL
Cholesterol: 162 mg/dL (ref 0–200)
HDL: 69.5 mg/dL (ref >39.00)
LDL Cholesterol: 70 mg/dL (ref 0–99)
NonHDL: 92.03
Total CHOL/HDL Ratio: 2
Triglycerides: 111 mg/dL (ref 0.0–149.0)
VLDL: 22.2 mg/dL (ref 0.0–40.0)

## 2018-07-17 LAB — HEMOGLOBIN A1C: Hgb A1c MFr Bld: 5.5 % (ref 4.6–6.5)

## 2018-07-23 ENCOUNTER — Encounter: Payer: Self-pay | Admitting: Internal Medicine

## 2018-07-23 ENCOUNTER — Ambulatory Visit (INDEPENDENT_AMBULATORY_CARE_PROVIDER_SITE_OTHER): Payer: Medicare HMO | Admitting: Internal Medicine

## 2018-07-23 ENCOUNTER — Other Ambulatory Visit: Payer: Self-pay

## 2018-07-23 DIAGNOSIS — G8929 Other chronic pain: Secondary | ICD-10-CM

## 2018-07-23 DIAGNOSIS — M545 Low back pain, unspecified: Secondary | ICD-10-CM

## 2018-07-23 DIAGNOSIS — I10 Essential (primary) hypertension: Secondary | ICD-10-CM

## 2018-07-23 DIAGNOSIS — D509 Iron deficiency anemia, unspecified: Secondary | ICD-10-CM | POA: Diagnosis not present

## 2018-07-23 DIAGNOSIS — K22719 Barrett's esophagus with dysplasia, unspecified: Secondary | ICD-10-CM | POA: Diagnosis not present

## 2018-07-23 DIAGNOSIS — E7800 Pure hypercholesterolemia, unspecified: Secondary | ICD-10-CM | POA: Diagnosis not present

## 2018-07-23 DIAGNOSIS — R42 Dizziness and giddiness: Secondary | ICD-10-CM

## 2018-07-23 MED ORDER — SIMVASTATIN 10 MG PO TABS: 10.0000 mg | ORAL_TABLET | Freq: Every day | ORAL | 3 refills | Status: DC

## 2018-07-23 NOTE — Progress Notes (Signed)
Patient ID: [name], female   DOB: [birth year], 85 y.o.   MRN: [id number]   Virtual Visit via video Note  This visit type was conducted due to national recommendations for restrictions regarding the COVID-19 pandemic (e.g. social distancing).  This format is felt to be most appropriate for this patient at this time.  All issues noted in this document were discussed and addressed.  No physical exam was performed (except for noted visual exam findings with Video Visits).   I connected with [name] by a video enabled telemedicine application and verified that I am speaking with the correct person using two identifiers. Location patient: home Location provider: work Persons participating in the virtual visit: patient, provider, pts husband Joneen Caraway and pts daughter Jenny Reichmann  I discussed the limitations, risks, security and privacy concerns of performing an evaluation and management service by video and the availability of in person appointments. The patient expressed understanding and agreed to proceed.   Reason for visit: scheduled follow up.    HPI: She reports she is doing relatively well.  Sees Dr Sharlet Salina for her back.  Stable.  Has f/u 09/26/18.  Was seen in ER 4/20 with urinary retention.  Ditropan was stopped.  Has been doing well since.  No chest pain.  No sob.  No acid reflux. Swallowing ok.  Has f/u with Dr Tiffany Kocher 07/25/18.   No abdominal pain.  Bowels moving.  Blood pressures have been averaging 120-140s/70-80.  She does report noticing some dizziness that started when she rolled over in bed.  Worse when rolls over or stands up.  Took antivert today. Is better.  Has seen ENT previously.  Diagnosed with vertigo.  Underwent epley maneuvers previously.  Discussed with her today.  Wants to monitor.  No other neurological symptoms.  No headache.  No vision change.     ROS: See pertinent positives and negatives per HPI.  Past Medical History:  Diagnosis Date  . Anemia   . Barrett's  esophagus   . Bowel obstruction (HCC)    s/p adhesion resection  . Chronic back pain   . Colon cancer ([place]) 1988 and 1989   adenocarcinoma, s/p resection x  2 and chemo  . Diverticulitis   . GERD (gastroesophageal reflux disease)   . H/O open leg wound   . Hiatal hernia   . Hypercholesteremia   . Hypertension   . Lumbar scoliosis   . OA (osteoarthritis)   . Peripheral neuropathy   . Recurrent sinus infections   . Renal cyst   . S/P chemotherapy, time since greater than 12 weeks    colon cancer  . Vertigo     Past Surgical History:  Procedure Laterality Date  . ABDOMINAL HYSTERECTOMY  1982  . adhesions resected     bowel obstruction  . APPENDECTOMY    . BREAST BIOPSY Left    negative 06/14/1985  . CHOLECYSTECTOMY  1994  . COLON RESECTION     x2.  s/p colon cancer  . [place]  . EXCISIONAL HEMORRHOIDECTOMY     with tubal ligation  . EXPLORATORY LAPAROTOMY  1991   Secondary to SBO    Family History  Problem Relation Age of Onset  . Breast cancer Mother   . Asthma Mother   . Stroke Father   . Hypertension Father   . Diabetes Father   . Ovarian cancer Sister        x2  .  Prostate cancer Brother   . Hypertension Brother        x3  . Heart disease Brother   . Hypercholesterolemia Brother        x3  . Diabetes Brother   . Spina bifida Grandchild   . Hematuria Neg Hx   . Kidney cancer Neg Hx   . Kidney disease Neg Hx   . Sickle cell trait Neg Hx   . Tuberculosis Neg Hx     SOCIAL HX: reviewed.    Current Outpatient Medications:  .  amLODipine (NORVASC) 10 MG tablet, TAKE 1 TABLET EVERY DAY, Disp: 90 tablet, Rfl: 1 .  Calcium Carbonate-Vitamin D (CALCIUM 600 + D PO), Take 600 Units by mouth 2 (two) times daily with a meal. , Disp: , Rfl:  .  fexofenadine (ALLEGRA) 60 MG tablet, Take 1 tablet (60 mg total) by mouth daily., Disp: 90 tablet, Rfl: 3 .  irbesartan (AVAPRO) 75 MG tablet, TAKE 1 TABLET EVERY DAY, Disp: 90  tablet, Rfl: 1 .  meclizine (ANTIVERT) 25 MG tablet, Take 25 mg by mouth 3 (three) times daily as needed., Disp: , Rfl:  .  meloxicam (MOBIC) 7.5 MG tablet, Take 7.5 mg by mouth daily., Disp: , Rfl:  .  metoCLOPramide (REGLAN) 5 MG tablet, , Disp: , Rfl:  .  metoprolol succinate (TOPROL-XL) 25 MG 24 hr tablet, Take 1 tablet (25 mg total) by mouth daily., Disp: 90 tablet, Rfl: 0 .  ondansetron (ZOFRAN) 4 MG tablet, Take 1 tablet (4 mg total) by mouth 2 (two) times daily as needed for nausea or vomiting., Disp: 30 tablet, Rfl: 0 .  oxyCODONE (OXY IR/ROXICODONE) 5 MG immediate release tablet, Take 5 mg by mouth 3 (three) times daily. , Disp: , Rfl:  .  pantoprazole (PROTONIX) 40 MG tablet, Take 40 mg by mouth 2 (two) times daily., Disp: , Rfl:  .  simvastatin (ZOCOR) 10 MG tablet, Take 1 tablet (10 mg total) by mouth at bedtime., Disp: 90 tablet, Rfl: 3 .  traMADol-acetaminophen (ULTRACET) 37.5-325 MG per tablet, Take 1 tablet by mouth 2 (two) times daily as needed. , Disp: , Rfl:   EXAM:  VITALS per patient if applicable:  371/06  GENERAL: alert, oriented, appears well and in no acute distress  HEENT: atraumatic, conjunttiva clear, no obvious abnormalities on inspection of external nose and ears  NECK: normal movements of the head and neck  LUNGS: on inspection no signs of respiratory distress, breathing rate appears normal, no obvious gross SOB, gasping or wheezing  CV: no obvious cyanosis  PSYCH/NEURO: pleasant and cooperative, no obvious depression or anxiety, speech and thought processing grossly intact  ASSESSMENT AND PLAN:  Discussed the following assessment and plan:  Anemia, iron deficiency Follow cbc.   Barrett's esophagus Followed by GI.  No upper symptoms now.  Has f/u with Dr Tiffany Kocher 07/25/18.    Chronic back pain Followed by Dr Sharlet Salina.    Dizziness Dizziness as outlined.  Has a history of vertigo.  Has been evaluated by ENT.  Diagnosed with vertigo.  Took antivert  today.  Helped.  Desires no further w/up at this time.  Follow.    Hereditary hemochromatosis ([place]) Was followed by hematology.  Have discussed with her regarding further f/u.  She declines.  Will follow cbc and ferritin.    Hyperbilirubinemia Follow liver panel.    Hypercholesteremia On simvastatin.  Low cholesterol diet and exercise.  Follow lipid panel and liver function tests.    Hypertension  Blood pressure as outlined.  Continue current medication regimen.  Follow pressures.  Follow metabolic panel.      I discussed the assessment and treatment plan with the patient. The patient was provided an opportunity to ask questions and all were answered. The patient agreed with the plan and demonstrated an understanding of the instructions.   The patient was advised to call back or seek an in-person evaluation if the symptoms worsen or if the condition fails to improve as anticipated.    [name], MD

## 2018-07-25 DIAGNOSIS — K219 Gastro-esophageal reflux disease without esophagitis: Secondary | ICD-10-CM | POA: Diagnosis not present

## 2018-07-25 DIAGNOSIS — Z8719 Personal history of other diseases of the digestive system: Secondary | ICD-10-CM | POA: Diagnosis not present

## 2018-07-25 DIAGNOSIS — K3184 Gastroparesis: Secondary | ICD-10-CM | POA: Diagnosis not present

## 2018-07-29 ENCOUNTER — Encounter: Payer: Self-pay | Admitting: Internal Medicine

## 2018-07-29 NOTE — Assessment & Plan Note (Signed)
Follow liver panel.  

## 2018-07-29 NOTE — Assessment & Plan Note (Signed)
Follow cbc.  

## 2018-07-29 NOTE — Assessment & Plan Note (Signed)
Followed by Dr Chasnis.   

## 2018-07-29 NOTE — Assessment & Plan Note (Signed)
Blood pressure as outlined.  Continue current medication regimen.  Follow pressures.  Follow metabolic panel.  

## 2018-07-29 NOTE — Assessment & Plan Note (Signed)
On simvastatin.  Low cholesterol diet and exercise.  Follow lipid panel and liver function tests.   

## 2018-07-29 NOTE — Assessment & Plan Note (Signed)
Dizziness as outlined.  Has a history of vertigo.  Has been evaluated by ENT.  Diagnosed with vertigo.  Took antivert today.  Helped.  Desires no further w/up at this time.  Follow.

## 2018-07-29 NOTE — Assessment & Plan Note (Signed)
Followed by GI.  No upper symptoms now.  Has f/u with Dr Tiffany Kocher 07/25/18.

## 2018-07-29 NOTE — Assessment & Plan Note (Signed)
Was followed by hematology.  Have discussed with her regarding further f/u.  She declines.  Will follow cbc and ferritin.

## 2018-07-30 ENCOUNTER — Other Ambulatory Visit: Payer: Self-pay | Admitting: Internal Medicine

## 2018-08-02 ENCOUNTER — Encounter: Payer: Self-pay | Admitting: Urology

## 2018-08-02 ENCOUNTER — Other Ambulatory Visit: Payer: Self-pay

## 2018-08-02 ENCOUNTER — Ambulatory Visit (INDEPENDENT_AMBULATORY_CARE_PROVIDER_SITE_OTHER): Payer: Medicare HMO | Admitting: Urology

## 2018-08-02 VITALS — BP 137/84 | HR 57 | Ht 63.0 in | Wt 140.0 lb

## 2018-08-02 DIAGNOSIS — R339 Retention of urine, unspecified: Secondary | ICD-10-CM | POA: Diagnosis not present

## 2018-08-02 LAB — BLADDER SCAN AMB NON-IMAGING

## 2018-08-02 MED ORDER — MIRABEGRON ER 50 MG PO TB24: 50.0000 mg | ORAL_TABLET | Freq: Every day | ORAL | 11 refills | Status: DC

## 2018-08-02 NOTE — Progress Notes (Signed)
   08/02/2018 4:26 PM   Jannifer Hick Irena Cords 1928-04-27 [id number]  Reason for visit: Follow up urinary symptoms  HPI: I saw Ms. Kittler back in urology clinic today for urinary symptoms.  Briefly, she is a frail 85 year old female that originally presented to the ER in April 2020 with severe constipation, abdominal pain, and ultimately urinary retention likely secondary to narcotic use and oxybutynin.  A catheter was placed and she passed a void trial in follow-up with low PVR.  She has been voiding well since that time, and PVR in clinic today is 45 cc.  Her primary complaint is nocturia 4-5 times per night that is extremely bothersome to her.  She does not have significant urinary symptoms during the day.  She is currently not on any bladder medications.  We discussed that overactive bladder (OAB) is not a disease, but is a symptom complex that is generally not life-threatening.  Symptoms typically include urinary urgency, frequency, and urge incontinence.  There are numerous treatment options, however there are risks and benefits with both medical and surgical management.  First-line treatment is behavioral therapies including bladder training, pelvic floor muscle training, and fluid management.  Second line treatments include oral antimuscarinics(Ditropan er, Trospium) and beta-3 agonist (Mybetriq). There is typically a period of medication trial (4-8 weeks) to find the optimal therapy and dosing. If symptoms are bothersome despite the above management, third line options include intra-detrusor botox, peripheral tibial nerve stimulation (PTNS), and interstim (SNS). These are more invasive treatments with higher side effect profile, but may improve quality of life for patients with severe OAB symptoms.   With her advanced age, frailty, history of urinary retention and constipation, she is not a candidate for anticholinergics.  Recommended a trial of 50 mg Myrbetriq daily for her OAB symptoms.  We discussed  behavioral strategies at length including minimizing fluids in the evening and double voiding prior to bed.  We discussed that if she still is having bothersome symptoms on the Myrbetriq, I would recommend a trial of PTNS.  RTC 1 month for telephone visit for symptom recheck  A total of 15 minutes were spent face-to-face with the patient, greater than 50% was spent in patient education, counseling, and coordination of care regarding overactive bladder.   Billey Co, [facility] [address], [place], [place] [address] [phone]

## 2018-08-30 ENCOUNTER — Telehealth (INDEPENDENT_AMBULATORY_CARE_PROVIDER_SITE_OTHER): Payer: Medicare HMO | Admitting: Urology

## 2018-08-30 ENCOUNTER — Other Ambulatory Visit: Payer: Self-pay

## 2018-08-30 DIAGNOSIS — N3281 Overactive bladder: Secondary | ICD-10-CM

## 2018-08-30 NOTE — Progress Notes (Signed)
Virtual Visit via Telephone Note  I connected with [name] on 08/30/18 at 11:45 AM EDT by telephone and verified that I am speaking with the correct person using two identifiers.   I discussed the limitations, risks, security and privacy concerns of performing an evaluation and management service by telephone and the availability of in person appointments. We discussed the impact of the COVID-19 pandemic on the healthcare system, and the importance of social distancing and reducing patient and provider exposure. I also discussed with the patient that there may be a patient responsible charge related to this service. The patient expressed understanding and agreed to proceed.  Reason for visit: OAB  History of Present Illness: I had phone follow-up with [name] today for OAB and nocturia.  She is an 85 year old frail female that originally presented to the ER in April 2020 with severe constipation, abdominal pain, and urinary retention secondary to narcotics and oxybutynin.  She passed a void trial and follow-up in clinic with a low PVR.  She continues to be bothered by nocturia 4-5 times per night.  At our last visit we had trialed Myrbetriq 50 mg.  She did not note any improvement with this medication.  She is following behavioral strategies of minimizing bladder irritants, minimizing fluids in the evening, and voiding prior to bed.  Assessment and Plan: We discussed that overactive bladder (OAB) is not a disease, but is a symptom complex that is generally not life-threatening.  Symptoms typically include urinary urgency, frequency, and urge incontinence.  There are numerous treatment options, however there are risks and benefits with both medical and surgical management.  First-line treatment is behavioral therapies including bladder training, pelvic floor muscle training, and fluid management.  Second line treatments include oral antimuscarinics(Ditropan er, Trospium) and beta-3 agonist  (Mybetriq). There is typically a period of medication trial (4-8 weeks) to find the optimal therapy and dosing. If symptoms are bothersome despite the above management, third line options include intra-detrusor botox, peripheral tibial nerve stimulation (PTNS), and interstim (SNS). These are more invasive treatments with higher side effect profile, but may improve quality of life for patients with severe OAB symptoms.   Patient would like to pursue a trial of PTNS, will schedule Okay to stop Myrbetriq  I discussed the assessment and treatment plan with the patient. The patient was provided an opportunity to ask questions and all were answered. The patient agreed with the plan and demonstrated an understanding of the instructions.   The patient was advised to call back or seek an in-person evaluation if the symptoms worsen or if the condition fails to improve as anticipated.  I provided 15 minutes of non-face-to-face time during this encounter.   Billey Co, MD

## 2018-09-04 ENCOUNTER — Telehealth: Payer: Self-pay | Admitting: Urology

## 2018-09-04 NOTE — Telephone Encounter (Signed)
-----   Message from Billey Co, MD sent at 08/30/2018 11:51 AM EDT ----- Regarding: PTNS Please set up PTNS, thanks  Nickolas Madrid, MD 08/30/2018

## 2018-09-04 NOTE — Telephone Encounter (Signed)
PTNS PA WAITNG ON INSURANCE TO APPROVE   mICHELLE

## 2018-09-05 ENCOUNTER — Telehealth: Payer: Self-pay | Admitting: Urology

## 2018-09-05 NOTE — Telephone Encounter (Signed)
Faxed PA request to insurance PA pending   Sharyn Lull

## 2018-09-11 NOTE — Telephone Encounter (Signed)
APPRIVED FOR 12 TREATMENTS AFTER THAT WILL NEED ANOTHER PA [id number] 09-05-18 THRU 12-05-18

## 2018-09-14 ENCOUNTER — Ambulatory Visit (INDEPENDENT_AMBULATORY_CARE_PROVIDER_SITE_OTHER): Payer: Medicare HMO

## 2018-09-14 ENCOUNTER — Other Ambulatory Visit: Payer: Self-pay

## 2018-09-14 DIAGNOSIS — R351 Nocturia: Secondary | ICD-10-CM

## 2018-09-14 NOTE — Progress Notes (Signed)
PTNS  Session # 1  Health & Social Factors: N/A Caffeine: 1 Alcohol: 0 Daytime voids #per day: 5 Night-time voids #per night: 4-5 Urgency: Mild Incontinence Episodes #per day: 2 Ankle used: Right Treatment Setting: 8 Feeling/ Response: Sensory Comments: Pt presents today with pitting edema, advised pt on wearing her compression tights until time of appointment and not to go all day without them   Preformed By: Gordy Clement, CMA (AAMA)  Follow Up: RTC in 1 week as scheduled

## 2018-09-21 ENCOUNTER — Other Ambulatory Visit: Payer: Self-pay

## 2018-09-21 ENCOUNTER — Ambulatory Visit (INDEPENDENT_AMBULATORY_CARE_PROVIDER_SITE_OTHER): Payer: Medicare HMO

## 2018-09-21 DIAGNOSIS — R351 Nocturia: Secondary | ICD-10-CM

## 2018-09-21 NOTE — Progress Notes (Signed)
PTNS  Session # 2  Health & Social Factors: N/A  Caffeine: 1 Alcohol: 0 Daytime voids #per day: 6-7 Night-time voids #per night: 4-5 Urgency: Mild Incontinence Episodes #per day: 1 Ankle used: Right Treatment Setting: 7 Feeling/ Response: Toe Flex Comments: N/A   Preformed By: Gordy Clement, CMA   Follow Up: RTC as scheduled

## 2018-09-26 DIAGNOSIS — M48062 Spinal stenosis, lumbar region with neurogenic claudication: Secondary | ICD-10-CM | POA: Diagnosis not present

## 2018-09-26 DIAGNOSIS — M62830 Muscle spasm of back: Secondary | ICD-10-CM | POA: Diagnosis not present

## 2018-09-26 DIAGNOSIS — M5136 Other intervertebral disc degeneration, lumbar region: Secondary | ICD-10-CM | POA: Diagnosis not present

## 2018-09-26 DIAGNOSIS — M7062 Trochanteric bursitis, left hip: Secondary | ICD-10-CM | POA: Diagnosis not present

## 2018-09-26 DIAGNOSIS — M5416 Radiculopathy, lumbar region: Secondary | ICD-10-CM | POA: Diagnosis not present

## 2018-09-28 ENCOUNTER — Other Ambulatory Visit: Payer: Self-pay

## 2018-09-28 ENCOUNTER — Ambulatory Visit (INDEPENDENT_AMBULATORY_CARE_PROVIDER_SITE_OTHER): Payer: Medicare HMO | Admitting: Physician Assistant

## 2018-09-28 DIAGNOSIS — N3281 Overactive bladder: Secondary | ICD-10-CM | POA: Diagnosis not present

## 2018-09-28 NOTE — Progress Notes (Signed)
PTNS  Session # 3  Health & Social Factors: no change Caffeine: 1 Alcohol: 0 Daytime voids #per day: 7 Night-time voids #per night: 4 Urgency: mild Incontinence Episodes #per day: 0 Ankle used: left Treatment Setting: 1 Feeling/ Response: both Comments: Patient tolerated well  Performed By: Debroah Loop, PA-C  Assistant: Elberta Leatherwood, CMA  Follow Up: 1 week for PTNS 4

## 2018-10-05 ENCOUNTER — Ambulatory Visit (INDEPENDENT_AMBULATORY_CARE_PROVIDER_SITE_OTHER): Payer: Medicare HMO | Admitting: *Deleted

## 2018-10-05 ENCOUNTER — Other Ambulatory Visit: Payer: Self-pay

## 2018-10-05 DIAGNOSIS — N3281 Overactive bladder: Secondary | ICD-10-CM

## 2018-10-05 NOTE — Progress Notes (Signed)
PTNS  Session # 4  Health & Social Factors: No change Caffeine: 1 Alcohol: 0 Daytime voids #per day: 7 Night-time voids #per night: 4 Urgency: Mild Incontinence Episodes #per day: 0 Ankle used: Left Treatment Setting: 1 Feeling/ Response: Both Comments: Patient tolerated well  Performed By: Verlene Mayer, CMA   Follow Up: One week

## 2018-10-09 DIAGNOSIS — M5416 Radiculopathy, lumbar region: Secondary | ICD-10-CM | POA: Diagnosis not present

## 2018-10-09 DIAGNOSIS — M48062 Spinal stenosis, lumbar region with neurogenic claudication: Secondary | ICD-10-CM | POA: Diagnosis not present

## 2018-10-09 DIAGNOSIS — M5136 Other intervertebral disc degeneration, lumbar region: Secondary | ICD-10-CM | POA: Diagnosis not present

## 2018-10-12 ENCOUNTER — Other Ambulatory Visit: Payer: Self-pay

## 2018-10-12 ENCOUNTER — Ambulatory Visit (INDEPENDENT_AMBULATORY_CARE_PROVIDER_SITE_OTHER): Payer: Medicare HMO | Admitting: *Deleted

## 2018-10-12 DIAGNOSIS — N3281 Overactive bladder: Secondary | ICD-10-CM | POA: Diagnosis not present

## 2018-10-12 LAB — PTNS-PERCUTANEOUS TIBIAL NERVE STIMULATION

## 2018-10-12 NOTE — Progress Notes (Signed)
PTNS  Session # 5  Health & Social Factors: No change Caffeine: 1 Alcohol: 0 Daytime voids #per day: 7 Night-time voids #per night: 4 Urgency: Mild Incontinence Episodes #per day: 1 Ankle used: Left Treatment Setting: 8 Feeling/ Response: Both Comments: Tolerated well, mild edema present  Performed By: Blondell Reveal  Follow Up: One week

## 2018-10-19 ENCOUNTER — Ambulatory Visit (INDEPENDENT_AMBULATORY_CARE_PROVIDER_SITE_OTHER): Payer: Medicare HMO | Admitting: *Deleted

## 2018-10-19 ENCOUNTER — Other Ambulatory Visit: Payer: Self-pay

## 2018-10-19 DIAGNOSIS — N3281 Overactive bladder: Secondary | ICD-10-CM

## 2018-10-19 NOTE — Progress Notes (Signed)
PTNS  Session # 6  Health & Social Factors: No change Caffeine: 2 Alcohol: 0 Daytime voids #per day: 7 Night-time voids #per night: 4 Urgency: Mild Incontinence Episodes #per day: 1 Ankle used: Left Treatment Setting: 8 Feeling/ Response: Both Comments: Tolerated well, edema present  Performed By: Verlene Mayer, CMA  Follow Up: One week

## 2018-10-26 ENCOUNTER — Other Ambulatory Visit: Payer: Self-pay

## 2018-10-26 ENCOUNTER — Ambulatory Visit (INDEPENDENT_AMBULATORY_CARE_PROVIDER_SITE_OTHER): Payer: Medicare HMO | Admitting: *Deleted

## 2018-10-26 DIAGNOSIS — N3281 Overactive bladder: Secondary | ICD-10-CM

## 2018-10-26 NOTE — Progress Notes (Signed)
PTNS  Session # 7  Health & Social Factors: No change Caffeine: 2 Alcohol: 0 Daytime voids #per day: 8 Night-time voids #per night: 4-5 Urgency: Mild Incontinence Episodes #per day: 3 Ankle used:Right Treatment Setting: 13 Feeling/ Response: Both Comments: Tolerated well, edema present-improved   Performed by: Blondell Reveal   Follow Up: One week

## 2018-11-02 ENCOUNTER — Other Ambulatory Visit: Payer: Self-pay

## 2018-11-02 ENCOUNTER — Ambulatory Visit (INDEPENDENT_AMBULATORY_CARE_PROVIDER_SITE_OTHER): Payer: Medicare HMO | Admitting: Family Medicine

## 2018-11-02 DIAGNOSIS — N3281 Overactive bladder: Secondary | ICD-10-CM | POA: Diagnosis not present

## 2018-11-02 NOTE — Progress Notes (Signed)
PTNS  Session # 8  Health & Social Factors: No change Caffeine: 2 Alcohol: 0 Daytime voids #per day: 11 Night-time voids #per night: 6 Urgency: strong Incontinence Episodes #per day: 0 Ankle used: left Treatment Setting: 6 Feeling/ Response: Sensory and toe flex Comments: Patient tolerated well  Preformed By: Elberta Leatherwood, CMA  Follow Up: 1 week #9

## 2018-11-09 ENCOUNTER — Other Ambulatory Visit: Payer: Self-pay

## 2018-11-09 ENCOUNTER — Ambulatory Visit (INDEPENDENT_AMBULATORY_CARE_PROVIDER_SITE_OTHER): Payer: Medicare HMO | Admitting: *Deleted

## 2018-11-09 DIAGNOSIS — N3281 Overactive bladder: Secondary | ICD-10-CM

## 2018-11-09 NOTE — Progress Notes (Signed)
PTNS  Session # 9  Health & Social Factors: No change Caffeine: 2 Alcohol: 0  Daytime voids #per day: 11 Night-time voids #per night: 6  Urgency: Strong  Incontinence Episodes #per day:0 Ankle used: Right Treatment Setting: 11 Feeling/ Response:Both Comments: Tolerated well  Perfomed by: Verlene Mayer, CMA  Follow Up: One week

## 2018-11-16 ENCOUNTER — Other Ambulatory Visit: Payer: Self-pay

## 2018-11-16 ENCOUNTER — Ambulatory Visit (INDEPENDENT_AMBULATORY_CARE_PROVIDER_SITE_OTHER): Payer: Medicare HMO | Admitting: *Deleted

## 2018-11-16 DIAGNOSIS — N3281 Overactive bladder: Secondary | ICD-10-CM

## 2018-11-16 NOTE — Progress Notes (Signed)
PTNS  Session # 10  Health & Social Factors: No change Caffeine: 1-2 Alcohol: 0 Daytime voids #per day: 10 Night-time voids #per night: 5-6 Urgency: mild Incontinence Episodes #per day: 1 Ankle used: Right Treatment Setting: 4 Feeling/ Response: Both Comments: Tolerated well Performed by: Verlene Mayer, CMA   Follow Up: As scheduled

## 2018-11-20 ENCOUNTER — Ambulatory Visit (INDEPENDENT_AMBULATORY_CARE_PROVIDER_SITE_OTHER): Payer: Medicare HMO

## 2018-11-20 ENCOUNTER — Other Ambulatory Visit: Payer: Self-pay

## 2018-11-20 DIAGNOSIS — Z0000 Encounter for general adult medical examination without abnormal findings: Secondary | ICD-10-CM | POA: Diagnosis not present

## 2018-11-20 NOTE — Progress Notes (Addendum)
Subjective:   [name] is a 85 y.o. female who presents for Medicare Annual (Subsequent) preventive examination.  Review of Systems:  No ROS.  Medicare Wellness Virtual Visit.  Visual/audio telehealth visit, UTA vital signs.   See social history for additional risk factors.   Cardiac Risk Factors include: advanced age (>6men, >13 women);hypertension     Objective:     Vitals: There were no vitals taken for this visit.  There is no height or weight on file to calculate BMI.  Advanced Directives 11/20/2018 06/07/2018 04/15/2018 05/12/2017 04/26/2017 08/11/2016 04/13/2016  Does Patient Have a Medical Advance Directive? Yes Yes No Yes Yes Yes Yes  Type of Paramedic of [place];Living will Living will - [place];Living will [place];Living will [place];Living will [place];Living will  Does patient want to make changes to medical advance directive? No - Patient declined - - - No - Patient declined - No - Patient declined  Copy of [place] in Chart? Yes - validated most recent copy scanned in chart (See row information) - - No - copy requested Yes No - copy requested No - copy requested  Would patient like information on creating a medical advance directive? - - No - Patient declined - - - No - Patient declined    Tobacco Social History   Tobacco Use  Smoking Status Never Smoker  Smokeless Tobacco Never Used     Counseling given: Not Answered   Clinical Intake:  Pre-visit preparation completed: Yes        Diabetes: No  How often do you need to have someone help you when you read instructions, pamphlets, or other written materials from your doctor or pharmacy?: 2 - Rarely  Interpreter Needed?: No     Past Medical History:  Diagnosis Date  . Anemia   . Barrett's esophagus   . Bowel obstruction (HCC)    s/p adhesion resection  . Chronic back  pain   . Colon cancer ([place]) 1988 and 1989   adenocarcinoma, s/p resection x  2 and chemo  . Diverticulitis   . GERD (gastroesophageal reflux disease)   . H/O open leg wound   . Hiatal hernia   . Hypercholesteremia   . Hypertension   . Lumbar scoliosis   . OA (osteoarthritis)   . Peripheral neuropathy   . Recurrent sinus infections   . Renal cyst   . S/P chemotherapy, time since greater than 12 weeks    colon cancer  . Vertigo    Past Surgical History:  Procedure Laterality Date  . ABDOMINAL HYSTERECTOMY  1982  . adhesions resected     bowel obstruction  . APPENDECTOMY    . BREAST BIOPSY Left    negative 06/14/1985  . CHOLECYSTECTOMY  1994  . COLON RESECTION     x2.  s/p colon cancer  . [place]  . EXCISIONAL HEMORRHOIDECTOMY     with tubal ligation  . EXPLORATORY LAPAROTOMY  1991   Secondary to SBO   Family History  Problem Relation Age of Onset  . Breast cancer Mother   . Asthma Mother   . Stroke Father   . Hypertension Father   . Diabetes Father   . Ovarian cancer Sister        x2  . Prostate cancer Brother   . Hypertension Brother  x3  . Heart disease Brother   . Hypercholesterolemia Brother        x3  . Diabetes Brother   . Spina bifida Grandchild   . Hematuria Neg Hx   . Kidney cancer Neg Hx   . Kidney disease Neg Hx   . Sickle cell trait Neg Hx   . Tuberculosis Neg Hx    Social History   Socioeconomic History  . Marital status: Married    Spouse name: Not on file  . Number of children: 4  . Years of education: 12th grade  . Highest education level: Not on file  Occupational History  . Occupation: homemaker  Social Needs  . Financial resource strain: Not hard at all  . Food insecurity    Worry: Never true    Inability: Never true  . Transportation needs    Medical: No    Non-medical: No  Tobacco Use  . Smoking status: Never Smoker  . Smokeless tobacco: Never Used  Substance and Sexual  Activity  . Alcohol use: No    Alcohol/week: 0.0 standard drinks  . Drug use: No  . Sexual activity: Never  Lifestyle  . Physical activity    Days per week: Not on file    Minutes per session: Not on file  . Stress: Not at all  Relationships  . Social Herbalist on phone: Not on file    Gets together: Not on file    Attends religious service: Not on file    Active member of club or organization: Not on file    Attends meetings of clubs or organizations: Not on file    Relationship status: Not on file  Other Topics Concern  . Not on file  Social History Narrative  . Not on file    Outpatient Encounter Medications as of 11/20/2018  Medication Sig  . amLODipine (NORVASC) 10 MG tablet TAKE 1 TABLET EVERY DAY  . Calcium Carbonate-Vitamin D (CALCIUM 600 + D PO) Take 600 Units by mouth 2 (two) times daily with a meal.   . Cholecalciferol (D2000 ULTRA STRENGTH) 50 MCG (2000 UT) CAPS Take by mouth.  . fexofenadine (ALLEGRA) 60 MG tablet Take 1 tablet (60 mg total) by mouth daily.  . irbesartan (AVAPRO) 75 MG tablet TAKE 1 TABLET EVERY DAY  . meclizine (ANTIVERT) 25 MG tablet Take 25 mg by mouth 3 (three) times daily as needed.  . meloxicam (MOBIC) 7.5 MG tablet Take 7.5 mg by mouth daily.  . metoCLOPramide (REGLAN) 5 MG tablet   . metoprolol succinate (TOPROL-XL) 25 MG 24 hr tablet TAKE 1 TABLET (25 MG TOTAL) BY MOUTH DAILY.  Marland Kitchen ondansetron (ZOFRAN) 4 MG tablet Take 1 tablet (4 mg total) by mouth 2 (two) times daily as needed for nausea or vomiting.  Marland Kitchen oxyCODONE (OXY IR/ROXICODONE) 5 MG immediate release tablet Take 5 mg by mouth 3 (three) times daily.   . pantoprazole (PROTONIX) 40 MG tablet Take 40 mg by mouth 2 (two) times daily.  . simvastatin (ZOCOR) 10 MG tablet Take 1 tablet (10 mg total) by mouth at bedtime.  . traMADol-acetaminophen (ULTRACET) 37.5-325 MG per tablet Take 1 tablet by mouth 2 (two) times daily as needed.   . [DISCONTINUED] mirabegron ER (MYRBETRIQ) 50 MG  TB24 tablet Take 1 tablet (50 mg total) by mouth daily. (Patient not taking: Reported on 11/20/2018)   No facility-administered encounter medications on file as of 11/20/2018.     Activities of Daily Living  In your present state of health, do you have any difficulty performing the following activities: 11/20/2018  Hearing? N  Vision? N  Difficulty concentrating or making decisions? N  Walking or climbing stairs? Y  Comment Unsteady gait. Walker in use.  Dressing or bathing? Y  Comment Husband assist  Doing errands, shopping? Y  Comment She does not Physiological scientist and eating ? Y  Using the Toilet? N  In the past six months, have you accidently leaked urine? Y  Comment Managed with daily napkin  Do you have problems with loss of bowel control? N  Managing your Medications? N  Managing your Finances? N  Housekeeping or managing your Housekeeping? Y  Comment Husband assist  Some recent data might be hidden    Patient Care Team: [name], MD as PCP - General (Internal Medicine)    Assessment:   This is a routine wellness examination for [place].  I connected with patient 11/20/18 at 12:00 PM EDT by an audio enabled telemedicine application and verified that I am speaking with the correct person using two identifiers. Patient stated full name and DOB. Patient gave permission to continue with virtual visit. Patient's location was at home and Nurse's location was at [place] office.   Health Maintenance Due: -Influenza vaccine 2020- discussed; to be completed in season with doctor or local pharmacy.   -Dexa Scan- she plans to follow up with pcp at a later date  -Mammogram- she plans to call and schedule; number provided.   Update all pending maintenance due as appropriate.   See completed HM at the end of note.   Eye: Visual acuity not assessed. Virtual visit. Wears corrective lenses. Followed by their ophthalmologist.   Dental: Visits every 6 months.    Hearing:  Demonstrates normal hearing during visit.  Safety:  Patient feels safe at home- yes Patient does have smoke detectors at home- yes Patient does wear sunscreen or protective clothing when in direct sunlight - yes Patient does wear seat belt when in a moving vehicle - yes Patient drives- no Adequate lighting in walkways free from debris- yes Grab bars and handrails used as appropriate- yes Ambulates with a walker as an assistive device Lifeline/life alert/medic alert on person when ambulating outside of the home- yes  Social: Alcohol intake - no  Smoking history- never   Smokers in home? none Illicit drug use? none  Depression: PHQ 2 &9 complete. See screening below. Denies irritability, anhedonia, sadness/tearfullness.  Stable.   Falls: See screening below.    Medication: Taking as directed and without issues.  Wears compression support hose as directed.   Covid-19: Precautions and sickness symptoms discussed. Wears mask, social distancing, hand hygiene as appropriate.   Activities of Daily Living Patient denies needing assistance with: household chores, feeding themselves, getting from bed to chair, getting to the toilet, bathing/showering, dressing, managing money, or preparing meals.  Assisted by husband with ADLs as needed.   Memory: Patient is alert. Patient denies difficulty focusing or concentrating. Correctly identified the president of the [place] and season. Patient likes to read the newspaper for brain stimulation.  BMI- discussed the importance of a healthy diet, water intake and the benefits of aerobic exercise.  Educational material provided.  Physical activity- walks from room to room daily.   Diet: Regular Water: good intake  Other Providers Patient Care Team: [name], MD as PCP - General (Internal Medicine)  Exercise Activities and Dietary recommendations Current Exercise Habits: Home exercise routine, Type  of exercise: walking, Intensity:  Mild  Goals    . Follow up with Primary Care Provider       Fall Risk Fall Risk  11/20/2018 04/26/2017 08/15/2016 03/30/2016 12/17/2015  Falls in the past year? 0 No No No No   Timed Get Up and Go performed: no, virtual visit  Depression Screen PHQ 2/9 Scores 11/20/2018 07/23/2018 04/26/2017 08/15/2016  PHQ - 2 Score 0 0 0 0     Cognitive Function MMSE - Mini Mental State Exam 03/30/2016 03/31/2015  Orientation to time 5 5  Orientation to Place 5 5  Registration 3 3  Attention/ Calculation 5 5  Recall 3 3  Language- name 2 objects 2 2  Language- repeat 1 1  Language- follow 3 step command 3 3  Language- read & follow direction 1 1  Write a sentence 1 1  Copy design 1 1  Total score 30 30     6CIT Screen 11/20/2018  What Year? 0 points  What month? 0 points  What time? 0 points  Count back from 20 0 points  Months in reverse 0 points  Repeat phrase 0 points  Total Score 0    Immunization History  Administered Date(s) Administered  . Influenza Split 12/08/2011  . Influenza, High Dose Seasonal PF 11/17/2015, 10/18/2016, 11/27/2017  . Influenza,inj,Quad PF,6+ Mos 10/31/2013, 10/23/2014  . Influenza-Unspecified 11/17/2015  . Pneumococcal Conjugate-13 05/07/2013  . Pneumococcal-Unspecified 11/30/2000, 01/04/2006  . Td 11/20/2007  . Tdap 04/15/2018  . Zoster Recombinat (Shingrix) 09/19/2017, 12/28/2017   Screening Tests Health Maintenance  Topic Date Due  . DEXA SCAN  02/05/1996  . MAMMOGRAM  11/10/2018  . INFLUENZA VACCINE  05/15/2019 (Originally 09/15/2018)  . TETANUS/TDAP  04/14/2028  . PNA vac Low Risk Adult  Completed      Plan:   Keep all routine maintenance appointments.   Follow up 12/04/18 @ 1:30.   Medicare Attestation I have personally reviewed: The patient's medical and social history Their use of alcohol, tobacco or illicit drugs Their current medications and supplements The patient's functional ability including ADLs,fall risks, home safety risks,  cognitive, and hearing and visual impairment Diet and physical activities Evidence for depression   In addition, I have reviewed and discussed with patient certain preventive protocols, quality metrics, and best practice recommendations. A written personalized care plan for preventive services as well as general preventive health recommendations were provided to patient via mail.     OBrien-Blaney, Lyrical Sowle L, LPN  [id number]   Reviewed above information.  Agree with assessment and plan.    Dr Nicki Reaper

## 2018-11-20 NOTE — Patient Instructions (Addendum)
  [name] , Thank you for taking time to come for your Medicare Wellness Visit. I appreciate your ongoing commitment to your health goals. Please review the following plan we discussed and let me know if I can assist you in the future.   These are the goals we discussed: Goals    . Follow up with Primary Care Provider       This is a list of the screening recommended for you and due dates:  Health Maintenance  Topic Date Due  . DEXA scan (bone density measurement)  02/05/1996  . Mammogram  11/10/2018  . Flu Shot  05/15/2019*  . Tetanus Vaccine  04/14/2028  . Pneumonia vaccines  Completed  *Topic was postponed. The date shown is not the original due date.

## 2018-11-22 ENCOUNTER — Other Ambulatory Visit: Payer: Self-pay

## 2018-11-22 ENCOUNTER — Ambulatory Visit (INDEPENDENT_AMBULATORY_CARE_PROVIDER_SITE_OTHER): Payer: Medicare HMO | Admitting: Internal Medicine

## 2018-11-22 DIAGNOSIS — M545 Low back pain: Secondary | ICD-10-CM | POA: Diagnosis not present

## 2018-11-22 DIAGNOSIS — I10 Essential (primary) hypertension: Secondary | ICD-10-CM

## 2018-11-22 DIAGNOSIS — D509 Iron deficiency anemia, unspecified: Secondary | ICD-10-CM | POA: Diagnosis not present

## 2018-11-22 DIAGNOSIS — E7800 Pure hypercholesterolemia, unspecified: Secondary | ICD-10-CM | POA: Diagnosis not present

## 2018-11-22 DIAGNOSIS — K22719 Barrett's esophagus with dysplasia, unspecified: Secondary | ICD-10-CM | POA: Diagnosis not present

## 2018-11-22 DIAGNOSIS — R42 Dizziness and giddiness: Secondary | ICD-10-CM | POA: Diagnosis not present

## 2018-11-22 DIAGNOSIS — G2581 Restless legs syndrome: Secondary | ICD-10-CM

## 2018-11-22 DIAGNOSIS — R739 Hyperglycemia, unspecified: Secondary | ICD-10-CM | POA: Diagnosis not present

## 2018-11-22 DIAGNOSIS — G8929 Other chronic pain: Secondary | ICD-10-CM

## 2018-11-22 MED ORDER — GABAPENTIN 100 MG PO CAPS: 100.0000 mg | ORAL_CAPSULE | Freq: Every day | ORAL | 1 refills | Status: DC

## 2018-11-22 NOTE — Progress Notes (Signed)
Patient ID: [name], female   DOB: [birth year], 85 y.o.   MRN: [id number]   Virtual Visit via video Note  This visit type was conducted due to national recommendations for restrictions regarding the COVID-19 pandemic (e.g. social distancing).  This format is felt to be most appropriate for this patient at this time.  All issues noted in this document were discussed and addressed.  No physical exam was performed (except for noted visual exam findings with Video Visits).   I connected with [name] by a video enabled telemedicine application and verified that I am speaking with the correct person using two identifiers. Location patient: home Location provider: work  Persons participating in the virtual visit: patient, provider, pts daughter [name]) and pts husband.   I discussed the limitations, risks, security and privacy concerns of performing an evaluation and management service by video and the availability of in person appointments.  The patient expressed understanding and agreed to proceed.   Reason for visit: work in appt  HPI: She is accompanied by her daughter and her husband.  History obtained from all of them.  Has a history of vertigo.  Has been evaluated by ENT.  Diagnosed with vertigo.  Started having dizziness today.  Took meclizine earlier today.  Feels better.  The dizziness feels similar to her previous vertigo.  She describes restless legs.  Discomfort - unable to keep her legs still at night.  No chest pain.  Breathing stable.  No acid reflux.  No abdominal pain.  Bowels moving.  No upper GI symptoms.  No nausea or vomiting.     ROS: See pertinent positives and negatives per HPI.  Past Medical History:  Diagnosis Date  . Anemia   . Barrett's esophagus   . Bowel obstruction (HCC)    s/p adhesion resection  . Chronic back pain   . Colon cancer ([place]) 1988 and 1989   adenocarcinoma, s/p resection x  2 and chemo  . Diverticulitis   . GERD (gastroesophageal reflux  disease)   . H/O open leg wound   . Hiatal hernia   . Hypercholesteremia   . Hypertension   . Lumbar scoliosis   . OA (osteoarthritis)   . Peripheral neuropathy   . Recurrent sinus infections   . Renal cyst   . S/P chemotherapy, time since greater than 12 weeks    colon cancer  . Vertigo     Past Surgical History:  Procedure Laterality Date  . ABDOMINAL HYSTERECTOMY  1982  . adhesions resected     bowel obstruction  . APPENDECTOMY    . BREAST BIOPSY Left    negative 06/14/1985  . CHOLECYSTECTOMY  1994  . COLON RESECTION     x2.  s/p colon cancer  . [place]  . EXCISIONAL HEMORRHOIDECTOMY     with tubal ligation  . EXPLORATORY LAPAROTOMY  1991   Secondary to SBO    Family History  Problem Relation Age of Onset  . Breast cancer Mother   . Asthma Mother   . Stroke Father   . Hypertension Father   . Diabetes Father   . Ovarian cancer Sister        x2  . Prostate cancer Brother   . Hypertension Brother        x3  . Heart disease Brother   . Hypercholesterolemia Brother        x3  . Diabetes Brother   .  Spina bifida Grandchild   . Hematuria Neg Hx   . Kidney cancer Neg Hx   . Kidney disease Neg Hx   . Sickle cell trait Neg Hx   . Tuberculosis Neg Hx     SOCIAL HX: reviewed.    Current Outpatient Medications:  .  amLODipine (NORVASC) 10 MG tablet, TAKE 1 TABLET EVERY DAY, Disp: 90 tablet, Rfl: 1 .  Calcium Carbonate-Vitamin D (CALCIUM 600 + D PO), Take 600 Units by mouth 2 (two) times daily with a meal. , Disp: , Rfl:  .  Cholecalciferol (D2000 ULTRA STRENGTH) 50 MCG (2000 UT) CAPS, Take by mouth., Disp: , Rfl:  .  fexofenadine (ALLEGRA) 60 MG tablet, Take 1 tablet (60 mg total) by mouth daily., Disp: 90 tablet, Rfl: 3 .  gabapentin (NEURONTIN) 100 MG capsule, Take 1 capsule (100 mg total) by mouth at bedtime., Disp: 30 capsule, Rfl: 1 .  irbesartan (AVAPRO) 75 MG tablet, TAKE 1 TABLET EVERY DAY, Disp: 90 tablet, Rfl: 1  .  meclizine (ANTIVERT) 25 MG tablet, Take 25 mg by mouth 3 (three) times daily as needed., Disp: , Rfl:  .  meloxicam (MOBIC) 7.5 MG tablet, Take 7.5 mg by mouth daily., Disp: , Rfl:  .  metoCLOPramide (REGLAN) 5 MG tablet, , Disp: , Rfl:  .  metoprolol succinate (TOPROL-XL) 25 MG 24 hr tablet, TAKE 1 TABLET (25 MG TOTAL) BY MOUTH DAILY., Disp: 90 tablet, Rfl: 1 .  ondansetron (ZOFRAN) 4 MG tablet, Take 1 tablet (4 mg total) by mouth 2 (two) times daily as needed for nausea or vomiting., Disp: 30 tablet, Rfl: 0 .  oxyCODONE (OXY IR/ROXICODONE) 5 MG immediate release tablet, Take 5 mg by mouth 3 (three) times daily. , Disp: , Rfl:  .  pantoprazole (PROTONIX) 40 MG tablet, Take 40 mg by mouth 2 (two) times daily., Disp: , Rfl:  .  simvastatin (ZOCOR) 10 MG tablet, Take 1 tablet (10 mg total) by mouth at bedtime., Disp: 90 tablet, Rfl: 3 .  traMADol-acetaminophen (ULTRACET) 37.5-325 MG per tablet, Take 1 tablet by mouth 2 (two) times daily as needed. , Disp: , Rfl:   EXAM:  VITALS per patient if applicable: 99991111  GENERAL: alert, oriented, appears well and in no acute distress  HEENT: atraumatic, conjunttiva clear, no obvious abnormalities on inspection of external nose and ears  NECK: normal movements of the head and neck  LUNGS: on inspection no signs of respiratory distress, breathing rate appears normal, no obvious gross SOB, gasping or wheezing  CV: no obvious cyanosis  PSYCH/NEURO: pleasant and cooperative, no obvious depression or anxiety, speech and thought processing grossly intact  ASSESSMENT AND PLAN:  Discussed the following assessment and plan:  Anemia, iron deficiency Follow cbc.   Barrett's esophagus Followed by GI.  No upper symptoms reported.    Chronic back pain Followed by Dr Sharlet Salina.    Dizziness Dizziness as outlined.  History of vertigo.  Has previously been evaluated by ENT.  Diagnosed with vertigo.  This episode feels similar to previous flares.  Took  meclizine this am.  Feels better.  Follow.    Hypercholesteremia On simvastatin.  Low cholesterol diet and exercise.  Follow lipid panel and liver function tests.    Hypertension Blood pressure a little elevated today.  Has been under better control.  Have her spot check her pressure.  Send in readings.  Follow pressures.  Follow metabolic panel.   Restless legs syndrome Pain - legs as outlined.  Having  difficulty sleeping.  Gabapentin as directed.  Follow.      I discussed the assessment and treatment plan with the patient. The patient was provided an opportunity to ask questions and all were answered. The patient agreed with the plan and demonstrated an understanding of the instructions.   The patient was advised to call back or seek an in-person evaluation if the symptoms worsen or if the condition fails to improve as anticipated.   [name], MD

## 2018-11-23 ENCOUNTER — Ambulatory Visit (INDEPENDENT_AMBULATORY_CARE_PROVIDER_SITE_OTHER): Payer: Medicare HMO | Admitting: *Deleted

## 2018-11-23 DIAGNOSIS — N3281 Overactive bladder: Secondary | ICD-10-CM

## 2018-11-23 NOTE — Progress Notes (Signed)
PTNS 11  Session # 11  Health & Social Factors: No change Caffeine: 1-2 Alcohol: 0 Daytime voids #per day: 10 Night-time voids #per night: 5 Urgency: Moderate Incontinence Episodes #per day: 1-2 Ankle used: Left Treatment Setting: 8 Feeling/ Response: Both Comments: Tolerated well Mild edema present   Performed By: Verlene Mayer, CMA   Follow Up: One week

## 2018-11-25 ENCOUNTER — Encounter: Payer: Self-pay | Admitting: Internal Medicine

## 2018-11-25 DIAGNOSIS — G2581 Restless legs syndrome: Secondary | ICD-10-CM | POA: Insufficient documentation

## 2018-11-25 NOTE — Assessment & Plan Note (Signed)
Blood pressure a little elevated today.  Has been under better control.  Have her spot check her pressure.  Send in readings.  Follow pressures.  Follow metabolic panel.

## 2018-11-25 NOTE — Assessment & Plan Note (Signed)
Dizziness as outlined.  History of vertigo.  Has previously been evaluated by ENT.  Diagnosed with vertigo.  This episode feels similar to previous flares.  Took meclizine this am.  Feels better.  Follow.

## 2018-11-25 NOTE — Assessment & Plan Note (Signed)
Followed by GI.  No upper symptoms reported.

## 2018-11-25 NOTE — Assessment & Plan Note (Signed)
Follow cbc.  

## 2018-11-25 NOTE — Assessment & Plan Note (Signed)
Pain - legs as outlined.  Having difficulty sleeping.  Gabapentin as directed.  Follow.

## 2018-11-25 NOTE — Assessment & Plan Note (Signed)
Followed by Dr Chasnis.   

## 2018-11-25 NOTE — Assessment & Plan Note (Signed)
On simvastatin.  Low cholesterol diet and exercise.  Follow lipid panel and liver function tests.   

## 2018-11-30 ENCOUNTER — Ambulatory Visit (INDEPENDENT_AMBULATORY_CARE_PROVIDER_SITE_OTHER): Payer: Medicare HMO

## 2018-11-30 ENCOUNTER — Other Ambulatory Visit: Payer: Self-pay

## 2018-11-30 DIAGNOSIS — N3281 Overactive bladder: Secondary | ICD-10-CM

## 2018-11-30 NOTE — Progress Notes (Signed)
PTNS 12  Session # 12  Health & Social Factors: No change Caffeine: 1-2 Alcohol: 0 Daytime voids #per day: 10 Night-time voids #per night: 5 Urgency: Moderate Incontinence Episodes #per day: 1-2 Ankle used: Left Treatment Setting: 8 Feeling/ Response: Both Comments: Tolerated well Mild edema present   Performed By: Gaspar Cola, CMA   Follow Up: one month with [facility] .

## 2018-12-03 ENCOUNTER — Other Ambulatory Visit (INDEPENDENT_AMBULATORY_CARE_PROVIDER_SITE_OTHER): Payer: Medicare HMO

## 2018-12-03 ENCOUNTER — Other Ambulatory Visit: Payer: Self-pay

## 2018-12-03 DIAGNOSIS — D509 Iron deficiency anemia, unspecified: Secondary | ICD-10-CM

## 2018-12-03 DIAGNOSIS — Z23 Encounter for immunization: Secondary | ICD-10-CM

## 2018-12-03 DIAGNOSIS — I10 Essential (primary) hypertension: Secondary | ICD-10-CM

## 2018-12-03 DIAGNOSIS — R739 Hyperglycemia, unspecified: Secondary | ICD-10-CM

## 2018-12-03 DIAGNOSIS — E7800 Pure hypercholesterolemia, unspecified: Secondary | ICD-10-CM | POA: Diagnosis not present

## 2018-12-03 LAB — CBC WITH DIFFERENTIAL/PLATELET
Basophils Absolute: 0 10*3/uL (ref 0.0–0.1)
Basophils Relative: 0.6 % (ref 0.0–3.0)
Eosinophils Absolute: 0.3 10*3/uL (ref 0.0–0.7)
Eosinophils Relative: 4 % (ref 0.0–5.0)
HCT: 41.6 % (ref 36.0–46.0)
Hemoglobin: 14 g/dL (ref 12.0–15.0)
Lymphocytes Relative: 23.4 % (ref 12.0–46.0)
Lymphs Abs: 1.7 10*3/uL (ref 0.7–4.0)
MCHC: 33.7 g/dL (ref 30.0–36.0)
MCV: 95.8 fl (ref 78.0–100.0)
Monocytes Absolute: 0.5 10*3/uL (ref 0.1–1.0)
Monocytes Relative: 6.9 % (ref 3.0–12.0)
Neutro Abs: 4.8 10*3/uL (ref 1.4–7.7)
Neutrophils Relative %: 65.1 % (ref 43.0–77.0)
Platelets: 273 10*3/uL (ref 150.0–400.0)
RBC: 4.34 Mil/uL (ref 3.87–5.11)
RDW: 13.9 % (ref 11.5–15.5)
WBC: 7.3 10*3/uL (ref 4.0–10.5)

## 2018-12-03 LAB — BASIC METABOLIC PANEL
BUN: 18 mg/dL (ref 6–23)
CO2: 28 mEq/L (ref 19–32)
Calcium: 10 mg/dL (ref 8.4–10.5)
Chloride: 99 mEq/L (ref 96–112)
Creatinine, Ser: 0.86 mg/dL (ref 0.40–1.20)
GFR: 62.3 mL/min (ref >60.00)
Glucose, Bld: 93 mg/dL (ref 70–99)
Potassium: 3.7 mEq/L (ref 3.5–5.1)
Sodium: 138 mEq/L (ref 135–145)

## 2018-12-03 LAB — HEPATIC FUNCTION PANEL
ALT: 11 U/L (ref 0–35)
AST: 18 U/L (ref 0–37)
Albumin: 4.7 g/dL (ref 3.5–5.2)
Alkaline Phosphatase: 59 U/L (ref 39–117)
Bilirubin, Direct: 0.2 mg/dL (ref 0.0–0.3)
Total Bilirubin: 1.2 mg/dL (ref 0.2–1.2)
Total Protein: 7.4 g/dL (ref 6.0–8.3)

## 2018-12-03 LAB — LIPID PANEL
Cholesterol: 173 mg/dL (ref 0–200)
HDL: 77.1 mg/dL (ref >39.00)
LDL Cholesterol: 72 mg/dL (ref 0–99)
NonHDL: 96.01
Total CHOL/HDL Ratio: 2
Triglycerides: 120 mg/dL (ref 0.0–149.0)
VLDL: 24 mg/dL (ref 0.0–40.0)

## 2018-12-03 LAB — FERRITIN: Ferritin: 188.5 ng/mL (ref 10.0–291.0)

## 2018-12-03 LAB — HEMOGLOBIN A1C: Hgb A1c MFr Bld: 5.5 % (ref 4.6–6.5)

## 2018-12-03 LAB — TSH: TSH: 2.36 u[IU]/mL (ref 0.35–4.50)

## 2018-12-04 ENCOUNTER — Encounter: Payer: Self-pay | Admitting: Internal Medicine

## 2018-12-04 ENCOUNTER — Ambulatory Visit (INDEPENDENT_AMBULATORY_CARE_PROVIDER_SITE_OTHER): Payer: Medicare HMO | Admitting: Internal Medicine

## 2018-12-04 VITALS — BP 137/74

## 2018-12-04 DIAGNOSIS — M545 Low back pain: Secondary | ICD-10-CM

## 2018-12-04 DIAGNOSIS — K22719 Barrett's esophagus with dysplasia, unspecified: Secondary | ICD-10-CM | POA: Diagnosis not present

## 2018-12-04 DIAGNOSIS — G8929 Other chronic pain: Secondary | ICD-10-CM

## 2018-12-04 DIAGNOSIS — G2581 Restless legs syndrome: Secondary | ICD-10-CM

## 2018-12-04 DIAGNOSIS — Z85038 Personal history of other malignant neoplasm of large intestine: Secondary | ICD-10-CM

## 2018-12-04 DIAGNOSIS — D509 Iron deficiency anemia, unspecified: Secondary | ICD-10-CM

## 2018-12-04 DIAGNOSIS — L989 Disorder of the skin and subcutaneous tissue, unspecified: Secondary | ICD-10-CM

## 2018-12-04 DIAGNOSIS — R739 Hyperglycemia, unspecified: Secondary | ICD-10-CM | POA: Diagnosis not present

## 2018-12-04 DIAGNOSIS — R42 Dizziness and giddiness: Secondary | ICD-10-CM

## 2018-12-04 DIAGNOSIS — E7800 Pure hypercholesterolemia, unspecified: Secondary | ICD-10-CM

## 2018-12-04 DIAGNOSIS — I10 Essential (primary) hypertension: Secondary | ICD-10-CM

## 2018-12-04 MED ORDER — GABAPENTIN 100 MG PO CAPS: 100.0000 mg | ORAL_CAPSULE | Freq: Every day | ORAL | 1 refills | Status: DC

## 2018-12-04 NOTE — Telephone Encounter (Signed)
Error

## 2018-12-04 NOTE — Progress Notes (Signed)
Patient ID: [name], female   DOB: Jun 21, 1928, 85 y.o.   MRN: [id number]   Virtual Visit via video Note  This visit type was conducted due to national recommendations for restrictions regarding the COVID-19 pandemic (e.g. social distancing).  This format is felt to be most appropriate for this patient at this time.  All issues noted in this document were discussed and addressed.  No physical exam was performed (except for noted visual exam findings with Video Visits).   I connected with [name] by a video enabled telemedicine application and verified that I am speaking with the correct person using two identifiers. Location patient: home Location provider: work Persons participating in the virtual visit: patient, provider, pts daughter - Caren Griffins and pts husband.    I discussed the limitations, risks, security and privacy concerns of performing an evaluation and management service by video and the availability of in person appointments. The patient expressed understanding and agreed to proceed.   Reason for visit: scheduled follow up.    HPI: She is doing better.  Last visit was having issues with vertigo.  No dizziness now.  Also having issues with pain and restless legs.  Started on low dose gabapentin.  Helping. Pain is better.  Resting better.  Eating well.  No chest pain. No sob.  No acid reflux.  No abdominal pain.  Bowels moving.  Back lesion.  Persistent.  Request dermatology evaluation.  Sees Dr Nehemiah Massed.  Blood pressure doing well.  Overall feels better.     ROS: See pertinent positives and negatives per HPI.  Past Medical History:  Diagnosis Date  . Anemia   . Barrett's esophagus   . Bowel obstruction (HCC)    s/p adhesion resection  . Chronic back pain   . Colon cancer ([place]) 1988 and 1989   adenocarcinoma, s/p resection x  2 and chemo  . Diverticulitis   . GERD (gastroesophageal reflux disease)   . H/O open leg wound   . Hiatal hernia   . Hypercholesteremia    . Hypertension   . Lumbar scoliosis   . OA (osteoarthritis)   . Peripheral neuropathy   . Recurrent sinus infections   . Renal cyst   . S/P chemotherapy, time since greater than 12 weeks    colon cancer  . Vertigo     Past Surgical History:  Procedure Laterality Date  . ABDOMINAL HYSTERECTOMY  1982  . adhesions resected     bowel obstruction  . APPENDECTOMY    . BREAST BIOPSY Left    negative 06/14/1985  . CHOLECYSTECTOMY  1994  . COLON RESECTION     x2.  s/p colon cancer  . [place]  . EXCISIONAL HEMORRHOIDECTOMY     with tubal ligation  . EXPLORATORY LAPAROTOMY  1991   Secondary to SBO    Family History  Problem Relation Age of Onset  . Breast cancer Mother   . Asthma Mother   . Stroke Father   . Hypertension Father   . Diabetes Father   . Ovarian cancer Sister        x2  . Prostate cancer Brother   . Hypertension Brother        x3  . Heart disease Brother   . Hypercholesterolemia Brother        x3  . Diabetes Brother   . Spina bifida Grandchild   . Hematuria Neg Hx   . Kidney cancer  Neg Hx   . Kidney disease Neg Hx   . Sickle cell trait Neg Hx   . Tuberculosis Neg Hx     SOCIAL HX: reviewed.    Current Outpatient Medications:  .  amLODipine (NORVASC) 10 MG tablet, TAKE 1 TABLET EVERY DAY, Disp: 90 tablet, Rfl: 1 .  Calcium Carbonate-Vitamin D (CALCIUM 600 + D PO), Take 600 Units by mouth 2 (two) times daily with a meal. , Disp: , Rfl:  .  Cholecalciferol (D2000 ULTRA STRENGTH) 50 MCG (2000 UT) CAPS, Take by mouth., Disp: , Rfl:  .  fexofenadine (ALLEGRA) 60 MG tablet, Take 1 tablet (60 mg total) by mouth daily., Disp: 90 tablet, Rfl: 3 .  gabapentin (NEURONTIN) 100 MG capsule, Take 1 capsule (100 mg total) by mouth at bedtime., Disp: 90 capsule, Rfl: 1 .  irbesartan (AVAPRO) 75 MG tablet, TAKE 1 TABLET EVERY DAY, Disp: 90 tablet, Rfl: 1 .  meclizine (ANTIVERT) 25 MG tablet, Take 25 mg by mouth 3 (three) times  daily as needed., Disp: , Rfl:  .  meloxicam (MOBIC) 7.5 MG tablet, Take 7.5 mg by mouth daily., Disp: , Rfl:  .  metoCLOPramide (REGLAN) 5 MG tablet, , Disp: , Rfl:  .  metoprolol succinate (TOPROL-XL) 25 MG 24 hr tablet, TAKE 1 TABLET (25 MG TOTAL) BY MOUTH DAILY., Disp: 90 tablet, Rfl: 1 .  ondansetron (ZOFRAN) 4 MG tablet, Take 1 tablet (4 mg total) by mouth 2 (two) times daily as needed for nausea or vomiting., Disp: 30 tablet, Rfl: 0 .  oxyCODONE (OXY IR/ROXICODONE) 5 MG immediate release tablet, Take 5 mg by mouth 3 (three) times daily. , Disp: , Rfl:  .  pantoprazole (PROTONIX) 40 MG tablet, Take 40 mg by mouth 2 (two) times daily., Disp: , Rfl:  .  simvastatin (ZOCOR) 10 MG tablet, Take 1 tablet (10 mg total) by mouth at bedtime., Disp: 90 tablet, Rfl: 3 .  traMADol-acetaminophen (ULTRACET) 37.5-325 MG per tablet, Take 1 tablet by mouth 2 (two) times daily as needed. , Disp: , Rfl:   EXAM:  VITALS per patient if applicable: 99991111  GENERAL: alert, oriented, appears well and in no acute distress  HEENT: atraumatic, conjunttiva clear, no obvious abnormalities on inspection of external nose and ears  NECK: normal movements of the head and neck  LUNGS: on inspection no signs of respiratory distress, breathing rate appears normal, no obvious gross SOB, gasping or wheezing  CV: no obvious cyanosis  PSYCH/NEURO: pleasant and cooperative, no obvious depression or anxiety, speech and thought processing grossly intact  ASSESSMENT AND PLAN:  Discussed the following assessment and plan:  Anemia, iron deficiency Follow cbc.   Back skin lesion Persistent raised lesion.  Refer to dermatology.    Barrett's esophagus Followed by GI.  No upper symptoms currently.    Chronic back pain Followed by Dr Sharlet Salina.    Dizziness Resolved.    Hereditary hemochromatosis ([place]) Was evaluated by hematology.  Has declined further f/u. Recent cbc and ferritin wnl.  Follow.    History of colon  cancer Has been followed by GI.   Hypercholesteremia On simvastatin.  Low cholesterol diet and exercise. Follow lipid panel and liver function tests.    Hypertension Blood pressure as outlined.  Daughter reports blood pressures averaging 120-130s/70s.  Continue current medication regimen.  Follow pressures.  Follow metabolic panel.   Restless legs syndrome Doing well on gabapentin.  Follow.      I discussed the assessment and treatment plan  with the patient. The patient was provided an opportunity to ask questions and all were answered. The patient agreed with the plan and demonstrated an understanding of the instructions.   The patient was advised to call back or seek an in-person evaluation if the symptoms worsen or if the condition fails to improve as anticipated.   [name], MD

## 2018-12-05 ENCOUNTER — Ambulatory Visit: Payer: Medicare HMO

## 2018-12-05 ENCOUNTER — Other Ambulatory Visit: Payer: Self-pay | Admitting: Internal Medicine

## 2018-12-05 DIAGNOSIS — Z1231 Encounter for screening mammogram for malignant neoplasm of breast: Secondary | ICD-10-CM

## 2018-12-09 ENCOUNTER — Encounter: Payer: Self-pay | Admitting: Internal Medicine

## 2018-12-09 NOTE — Assessment & Plan Note (Signed)
Was evaluated by hematology.  Has declined further f/u. Recent cbc and ferritin wnl.  Follow.

## 2018-12-09 NOTE — Assessment & Plan Note (Signed)
Persistent raised lesion.  Refer to dermatology.

## 2018-12-09 NOTE — Assessment & Plan Note (Signed)
Blood pressure as outlined.  Daughter reports blood pressures averaging 120-130s/70s.  Continue current medication regimen.  Follow pressures.  Follow metabolic panel.

## 2018-12-09 NOTE — Assessment & Plan Note (Signed)
Followed by GI.  No upper symptoms currently.

## 2018-12-09 NOTE — Assessment & Plan Note (Signed)
Follow cbc.  

## 2018-12-09 NOTE — Assessment & Plan Note (Signed)
Doing well on gabapentin.  Follow.

## 2018-12-09 NOTE — Assessment & Plan Note (Signed)
Resolved

## 2018-12-09 NOTE — Assessment & Plan Note (Signed)
Followed by Dr Chasnis.   

## 2018-12-09 NOTE — Assessment & Plan Note (Signed)
On simvastatin.  Low cholesterol diet and exercise.  Follow lipid panel and liver function tests.   

## 2018-12-09 NOTE — Assessment & Plan Note (Signed)
Has been followed by GI.  

## 2018-12-10 ENCOUNTER — Other Ambulatory Visit: Payer: Self-pay | Admitting: Internal Medicine

## 2018-12-10 ENCOUNTER — Ambulatory Visit
Admission: RE | Admit: 2018-12-10 | Discharge: 2018-12-10 | Disposition: A | Discharge status: Home / Self Care | Payer: Medicare HMO | Source: Ambulatory Visit | Attending: Internal Medicine | Admitting: Internal Medicine

## 2018-12-10 DIAGNOSIS — Z1231 Encounter for screening mammogram for malignant neoplasm of breast: Secondary | ICD-10-CM

## 2018-12-12 ENCOUNTER — Other Ambulatory Visit: Payer: Self-pay | Admitting: Internal Medicine

## 2018-12-13 ENCOUNTER — Other Ambulatory Visit: Payer: Self-pay | Admitting: Internal Medicine

## 2018-12-19 DIAGNOSIS — L820 Inflamed seborrheic keratosis: Secondary | ICD-10-CM | POA: Diagnosis not present

## 2018-12-19 DIAGNOSIS — D1800 Hemangioma unspecified site: Secondary | ICD-10-CM | POA: Diagnosis not present

## 2018-12-19 DIAGNOSIS — L578 Other skin changes due to chronic exposure to nonionizing radiation: Secondary | ICD-10-CM | POA: Diagnosis not present

## 2018-12-19 DIAGNOSIS — D692 Other nonthrombocytopenic purpura: Secondary | ICD-10-CM | POA: Diagnosis not present

## 2018-12-19 DIAGNOSIS — Z85828 Personal history of other malignant neoplasm of skin: Secondary | ICD-10-CM | POA: Diagnosis not present

## 2018-12-19 DIAGNOSIS — L821 Other seborrheic keratosis: Secondary | ICD-10-CM | POA: Diagnosis not present

## 2018-12-24 ENCOUNTER — Ambulatory Visit: Payer: Self-pay

## 2018-12-24 ENCOUNTER — Ambulatory Visit (INDEPENDENT_AMBULATORY_CARE_PROVIDER_SITE_OTHER): Payer: Medicare HMO | Admitting: Internal Medicine

## 2018-12-24 ENCOUNTER — Ambulatory Visit
Admission: RE | Admit: 2018-12-24 | Discharge: 2018-12-24 | Disposition: A | Discharge status: Home / Self Care | Payer: Medicare HMO | Source: Ambulatory Visit | Attending: Internal Medicine | Admitting: Internal Medicine

## 2018-12-24 ENCOUNTER — Other Ambulatory Visit: Payer: Self-pay

## 2018-12-24 ENCOUNTER — Telehealth: Payer: Self-pay | Admitting: Internal Medicine

## 2018-12-24 VITALS — BP 128/72 | HR 61 | Temp 98.4°F | Resp 16 | Wt 149.0 lb

## 2018-12-24 DIAGNOSIS — G8929 Other chronic pain: Secondary | ICD-10-CM | POA: Diagnosis not present

## 2018-12-24 DIAGNOSIS — I10 Essential (primary) hypertension: Secondary | ICD-10-CM

## 2018-12-24 DIAGNOSIS — M7989 Other specified soft tissue disorders: Secondary | ICD-10-CM

## 2018-12-24 DIAGNOSIS — G2581 Restless legs syndrome: Secondary | ICD-10-CM | POA: Diagnosis not present

## 2018-12-24 DIAGNOSIS — M545 Low back pain, unspecified: Secondary | ICD-10-CM

## 2018-12-24 DIAGNOSIS — E7800 Pure hypercholesterolemia, unspecified: Secondary | ICD-10-CM | POA: Diagnosis not present

## 2018-12-24 DIAGNOSIS — R600 Localized edema: Secondary | ICD-10-CM

## 2018-12-24 MED ORDER — AMLODIPINE BESYLATE 5 MG PO TABS: 5.0000 mg | ORAL_TABLET | Freq: Every day | ORAL | 2 refills | Status: DC

## 2018-12-24 MED ORDER — IRBESARTAN 150 MG PO TABS: 150.0000 mg | ORAL_TABLET | Freq: Every day | ORAL | 2 refills | Status: DC

## 2018-12-24 NOTE — Telephone Encounter (Signed)
Daughter called to report swelling of feet and ankles, and does extend slightly above the ankles, bilaterally.  Stated she noted the swelling on Thursday.  Reported the pt. Usually wears compression hose to manage the swelling.  Daughter stated that even with the compression hose, her swelling is worse than it has been.  Reported there is redness in bilat. lower legs above the swelling. Denied fever/ chills.  Denied that pt. C/o shortness of breath, cough, or chest discomfort.  Reported that pt. C/o weakness in both legs when walking.  Transferred daughter to Scheduler to get appt.  Agreed w/ plan.   Reason for Disposition . [1] Red area or streak [2] large (> 2 in. or 5 cm)  Answer Assessment - Initial Assessment Questions 1. ONSET: "When did the swelling start?" (e.g., minutes, hours, days)     Thurs., 11/5 2. LOCATION: "What part of the leg is swollen?"  "Are both legs swollen or just one leg?"    Feet and ankles are very swollen  3. SEVERITY: "How bad is the swelling?" (e.g., localized; mild, moderate, severe)  - Localized - small area of swelling localized to one leg  - MILD pedal edema - swelling limited to foot and ankle, pitting edema < 1/4 inch (6 mm) deep, rest and elevation eliminate most or all swelling  - MODERATE edema - swelling of lower leg to knee, pitting edema > 1/4 inch (6 mm) deep, rest and elevation only partially reduce swelling  - SEVERE edema - swelling extends above knee, facial or hand swelling present     Mild to moderate as far as location, but worse than normal  4. REDNESS: "Does the swelling look red or infected?"     Redness in ankle and lower  5. PAIN: "Is the swelling painful to touch?" If so, ask: "How painful is it?"   (Scale 1-10; mild, moderate or severe)     Denied pain  6. FEVER: "Do you have a fever?" If so, ask: "What is it, how was it measured, and when did it start?"      Denied  7. CAUSE: "What do you think is causing the leg swelling?"     *No  Answer* 8. MEDICAL HISTORY: "Do you have a history of heart failure, kidney disease, liver failure, or cancer?"     *No Answer* 9. RECURRENT SYMPTOM: "Have you had leg swelling before?" If so, ask: "When was the last time?" "What happened that time?"     Has had LE swelling that she wears compression hose for 10. OTHER SYMPTOMS: "Do you have any other symptoms?" (e.g., chest pain, difficulty breathing)      Denied shortness of breath or chest tightness; had redness of lower extremities; c/o leg weakness ; difficulty walking  11. PREGNANCY: "Is there any chance you are pregnant?" "When was your last menstrual period?"       N/a  Protocols used: LEG SWELLING AND EDEMA-A-AH

## 2018-12-24 NOTE — Patient Instructions (Signed)
Decrease the amlodipine to 5mg  per day.    Increase avapro to 150mg  per day.

## 2018-12-24 NOTE — Telephone Encounter (Signed)
Pt scheduled with Dr Nicki Reaper at 19.

## 2018-12-24 NOTE — Telephone Encounter (Signed)
[name] (daughter) would like to discuss irbesartan (AVAPRO) 150 MG tablet and the reason medication was prescribed, please advise

## 2018-12-24 NOTE — Progress Notes (Signed)
Patient ID: OAKLEI KLUTZ, female   DOB: 1929/01/27, 85 y.o.   MRN: [id number]   Subjective:    Patient ID: [name], female    DOB: December 26, 1928, 85 y.o.   MRN: [id number]  HPI  Patient here as a work in with concerns lower extremity swelling.  Noticed increased lower extremity swelling.  Has worsened recently.  Wears compression hose.  Still swelling.  Some redness.  No fever or chills.  No chest pain or sob.  No cough or congestion.  No abdominal pain.  Bowels moving.  On amlodipine 10mg .  Discussed could be contributing to increased lower extremity swelling.  Discussed diet - low sodium.      Past Medical History:  Diagnosis Date  . Anemia   . Barrett's esophagus   . Bowel obstruction (HCC)    s/p adhesion resection  . Chronic back pain   . Colon cancer ([place]) 1988 and 1989   adenocarcinoma, s/p resection x  2 and chemo  . Diverticulitis   . GERD (gastroesophageal reflux disease)   . H/O open leg wound   . Hiatal hernia   . Hypercholesteremia   . Hypertension   . Lumbar scoliosis   . OA (osteoarthritis)   . Peripheral neuropathy   . Recurrent sinus infections   . Renal cyst   . S/P chemotherapy, time since greater than 12 weeks    colon cancer  . Vertigo    Past Surgical History:  Procedure Laterality Date  . ABDOMINAL HYSTERECTOMY  1982  . adhesions resected     bowel obstruction  . APPENDECTOMY    . BREAST BIOPSY Left    negative 06/14/1985  . CHOLECYSTECTOMY  1994  . COLON RESECTION     x2.  s/p colon cancer  . [place]  . EXCISIONAL HEMORRHOIDECTOMY     with tubal ligation  . EXPLORATORY LAPAROTOMY  1991   Secondary to SBO   Family History  Problem Relation Age of Onset  . Breast cancer Mother   . Asthma Mother   . Stroke Father   . Hypertension Father   . Diabetes Father   . Ovarian cancer Sister        x2  . Prostate cancer Brother   . Hypertension Brother        x3  . Heart disease Brother   .  Hypercholesterolemia Brother        x3  . Diabetes Brother   . Spina bifida Grandchild   . Hematuria Neg Hx   . Kidney cancer Neg Hx   . Kidney disease Neg Hx   . Sickle cell trait Neg Hx   . Tuberculosis Neg Hx    Social History   Socioeconomic History  . Marital status: Married    Spouse name: Not on file  . Number of children: 4  . Years of education: 12th grade  . Highest education level: Not on file  Occupational History  . Occupation: homemaker  Social Needs  . Financial resource strain: Not hard at all  . Food insecurity    Worry: Never true    Inability: Never true  . Transportation needs    Medical: No    Non-medical: No  Tobacco Use  . Smoking status: Never Smoker  . Smokeless tobacco: Never Used  Substance and Sexual Activity  . Alcohol use: No    Alcohol/week: 0.0 standard drinks  . Drug use: No  .  Sexual activity: Never  Lifestyle  . Physical activity    Days per week: Not on file    Minutes per session: Not on file  . Stress: Not at all  Relationships  . Social Herbalist on phone: Not on file    Gets together: Not on file    Attends religious service: Not on file    Active member of club or organization: Not on file    Attends meetings of clubs or organizations: Not on file    Relationship status: Not on file  Other Topics Concern  . Not on file  Social History Narrative  . Not on file    Outpatient Encounter Medications as of 12/24/2018  Medication Sig  . amLODipine (NORVASC) 5 MG tablet Take 1 tablet (5 mg total) by mouth daily.  . Calcium Carbonate-Vitamin D (CALCIUM 600 + D PO) Take 600 Units by mouth 2 (two) times daily with a meal.   . Cholecalciferol (D2000 ULTRA STRENGTH) 50 MCG (2000 UT) CAPS Take by mouth.  . fexofenadine (ALLEGRA) 60 MG tablet TAKE 1 TABLET EVERY DAY  . gabapentin (NEURONTIN) 100 MG capsule Take 1 capsule (100 mg total) by mouth at bedtime.  . irbesartan (AVAPRO) 150 MG tablet Take 1 tablet (150 mg  total) by mouth daily.  . meclizine (ANTIVERT) 25 MG tablet Take 25 mg by mouth 3 (three) times daily as needed.  . meloxicam (MOBIC) 7.5 MG tablet Take 7.5 mg by mouth daily.  . metoCLOPramide (REGLAN) 5 MG tablet   . metoprolol succinate (TOPROL-XL) 25 MG 24 hr tablet TAKE 1 TABLET EVERY DAY  . ondansetron (ZOFRAN) 4 MG tablet Take 1 tablet (4 mg total) by mouth 2 (two) times daily as needed for nausea or vomiting.  Marland Kitchen oxyCODONE (OXY IR/ROXICODONE) 5 MG immediate release tablet Take 5 mg by mouth 3 (three) times daily.   . pantoprazole (PROTONIX) 40 MG tablet Take 40 mg by mouth 2 (two) times daily.  . simvastatin (ZOCOR) 10 MG tablet Take 1 tablet (10 mg total) by mouth at bedtime.  . traMADol-acetaminophen (ULTRACET) 37.5-325 MG per tablet Take 1 tablet by mouth 2 (two) times daily as needed.   . [DISCONTINUED] amLODipine (NORVASC) 10 MG tablet TAKE 1 TABLET EVERY DAY  . [DISCONTINUED] irbesartan (AVAPRO) 75 MG tablet TAKE 1 TABLET EVERY DAY   No facility-administered encounter medications on file as of 12/24/2018.    Review of Systems  Constitutional: Negative for appetite change and unexpected weight change.  HENT: Negative for congestion and sinus pressure.   Respiratory: Negative for cough, chest tightness and shortness of breath.   Cardiovascular: Positive for leg swelling. Negative for chest pain and palpitations.  Gastrointestinal: Negative for abdominal pain, diarrhea, nausea and vomiting.  Genitourinary: Negative for difficulty urinating and dysuria.  Musculoskeletal: Negative for joint swelling and myalgias.       Back pain stable.    Skin: Negative for color change and rash.  Neurological: Negative for dizziness, light-headedness and headaches.  Psychiatric/Behavioral: Negative for agitation and dysphoric mood.       Objective:    Physical Exam Constitutional:      General: She is not in acute distress.    Appearance: Normal appearance.  HENT:     Head: Normocephalic  and atraumatic.  Eyes:     General: No scleral icterus.       Right eye: No discharge.        Left eye: No discharge.  Conjunctiva/sclera: Conjunctivae normal.  Neck:     Musculoskeletal: Neck supple. No muscular tenderness.     Thyroid: No thyromegaly.  Cardiovascular:     Rate and Rhythm: Normal rate and regular rhythm.  Pulmonary:     Effort: No respiratory distress.     Breath sounds: Normal breath sounds. No wheezing.  Abdominal:     General: Bowel sounds are normal.     Palpations: Abdomen is soft.     Tenderness: There is no abdominal tenderness.  Musculoskeletal:     Comments: Lower extremity pedal and ankle/lower extremity swelling.  Some erythema - appears to be more c/w stasis changes.    Lymphadenopathy:     Cervical: No cervical adenopathy.  Skin:    Findings: No lesion.     Comments: Some erythema - lower extremities - stasis changes.    Neurological:     Mental Status: She is alert.  Psychiatric:        Mood and Affect: Mood normal.        Behavior: Behavior normal.     BP 128/72   Pulse 61   Temp 98.4 F (36.9 C)   Resp 16   Wt 149 lb (67.6 kg)   SpO2 97%   BMI 26.39 kg/m  Wt Readings from Last 3 Encounters:  12/24/18 149 lb (67.6 kg)  08/02/18 140 lb (63.5 kg)  06/14/18 140 lb (63.5 kg)     Lab Results  Component Value Date   WBC 7.3 12/03/2018   HGB 14.0 12/03/2018   HCT 41.6 12/03/2018   PLT 273.0 12/03/2018   GLUCOSE 93 12/03/2018   CHOL 173 12/03/2018   TRIG 120.0 12/03/2018   HDL 77.10 12/03/2018   LDLCALC 72 12/03/2018   ALT 11 12/03/2018   AST 18 12/03/2018   NA 138 12/03/2018   K 3.7 12/03/2018   CL 99 12/03/2018   CREATININE 0.86 12/03/2018   BUN 18 12/03/2018   CO2 28 12/03/2018   TSH 2.36 12/03/2018   INR 0.91 06/11/2015   HGBA1C 5.5 12/03/2018    Mm 3d Screen Breast Bilateral  Result Date: 12/10/2018 CLINICAL DATA:  Screening. EXAM: DIGITAL SCREENING BILATERAL MAMMOGRAM WITH TOMO AND CAD COMPARISON:  Previous  exam(s). ACR Breast Density Category b: There are scattered areas of fibroglandular density. FINDINGS: There are no findings suspicious for malignancy. Images were processed with CAD. IMPRESSION: No mammographic evidence of malignancy. A result letter of this screening mammogram will be mailed directly to the patient. RECOMMENDATION: Screening mammogram in one year. (Code:SM-B-01Y) BI-RADS CATEGORY  1: Negative. Electronically Signed   By: Nolon Nations M.D.   On: 12/10/2018 16:38       Assessment & Plan:   Problem List Items Addressed This Visit    Bilateral lower extremity edema    Increased lower extremity swelling.  Dicussed monitor sodium intake.  Discussed leg elevation and compression hose.  Will decrease amlodipine to 5mg  q day.  Increase avapro.  Follow.  Discussed if persistent swelling, may need [facility] evaluation.        Chronic back pain    Followed by Dr Sharlet Salina.  Stable.        Hypercholesteremia    On simvastatin.  Low cholesterol diet and exercise.  Follow lipid panel and liver function tests.        Relevant Medications   irbesartan (AVAPRO) 150 MG tablet   amLODipine (NORVASC) 5 MG tablet   Hypertension    Blood pressure stable.  Given increased  lower extremity swelling, will decrease amlodipine to 5mg  q day.  Increased avapro to 150mg  q day, since lower amlodipine dose.  Follow pressures.  Follow metabolic panel.       Relevant Medications   irbesartan (AVAPRO) 150 MG tablet   amLODipine (NORVASC) 5 MG tablet   Restless legs syndrome    Doing well on gabapentin.  On low dose.  Follow.        Swelling of left lower extremity - Primary    Swelling as outlined.  Adjust amlodipine dose.  Compression hose and leg elevation.  Follow.        Relevant Orders   DG Chest 2 View (Completed)       [name], MD

## 2018-12-25 NOTE — Telephone Encounter (Signed)
Left message for daughter. Spoke with patient and patient stated she thought that they had everything straightened out with her medications. Patients daughter will call back if needed.

## 2018-12-26 DIAGNOSIS — M48062 Spinal stenosis, lumbar region with neurogenic claudication: Secondary | ICD-10-CM | POA: Diagnosis not present

## 2018-12-26 DIAGNOSIS — M5136 Other intervertebral disc degeneration, lumbar region: Secondary | ICD-10-CM | POA: Diagnosis not present

## 2018-12-26 DIAGNOSIS — M7062 Trochanteric bursitis, left hip: Secondary | ICD-10-CM | POA: Diagnosis not present

## 2018-12-26 DIAGNOSIS — M62830 Muscle spasm of back: Secondary | ICD-10-CM | POA: Diagnosis not present

## 2018-12-26 DIAGNOSIS — M5416 Radiculopathy, lumbar region: Secondary | ICD-10-CM | POA: Diagnosis not present

## 2018-12-29 ENCOUNTER — Encounter: Payer: Self-pay | Admitting: Internal Medicine

## 2018-12-29 NOTE — Assessment & Plan Note (Signed)
Doing well on gabapentin.  On low dose.  Follow.

## 2018-12-29 NOTE — Assessment & Plan Note (Signed)
Followed by Dr Chasnis.  Stable.   

## 2018-12-29 NOTE — Assessment & Plan Note (Signed)
On simvastatin.  Low cholesterol diet and exercise.  Follow lipid panel and liver function tests.   

## 2018-12-29 NOTE — Assessment & Plan Note (Signed)
Swelling as outlined.  Adjust amlodipine dose.  Compression hose and leg elevation.  Follow.

## 2018-12-29 NOTE — Assessment & Plan Note (Signed)
Increased lower extremity swelling.  Dicussed monitor sodium intake.  Discussed leg elevation and compression hose.  Will decrease amlodipine to 5mg  q day.  Increase avapro.  Follow.  Discussed if persistent swelling, may need [facility] evaluation.

## 2018-12-29 NOTE — Assessment & Plan Note (Signed)
Blood pressure stable.  Given increased lower extremity swelling, will decrease amlodipine to 5mg  q day.  Increased avapro to 150mg  q day, since lower amlodipine dose.  Follow pressures.  Follow metabolic panel.

## 2019-01-01 NOTE — Progress Notes (Signed)
01/02/2019 12:08 PM   [name] Feb 13, 1929 [id number]  Referring provider: [name], [id number] [place],  [place] [address]  Chief Complaint  Patient presents with  . Over Active Bladder    HPI: [name] is an 85 year old female with OAB who present today for follow up.  The patient is  experiencing urgency x 8 or more, frequency x 8 or more, is restricting fluids to avoid visits to the restroom, is engaging in toilet mapping, incontinence x 0-3 and nocturia x 4-7.   Her BP is 197/64.   Her PVR is 0 mL. She failed Myrbetriq.  Not a candidate for anticholinergics due to age.  She has completed 12 weekly treatments of PTNS and she states that she is no better.  Patient denies any gross hematuria, dysuria or suprapubic/flank pain.  Patient denies any fevers, chills, nausea or vomiting.   Although she is having daytime frequency along with her nocturia, it is the nocturia that is the most bothersome to her as it interrupts her sleep.  She is getting up 4-5 times a night to urinate.  She states the urine volume is the same as when she urinates during the day.  She has not discussed the possibility of sleep apnea with her primary care physician Dr. Nicki Reaper.  She is limiting caffeine intake after noon and limiting fluid intake after 6 in the evening.  She is drinking mostly water and an occasional sweet tea or soda.  She is also limiting her salt intake, but she is not on a strict low-sodium diet.  PMH: Past Medical History:  Diagnosis Date  . Anemia   . Barrett's esophagus   . Bowel obstruction (HCC)    s/p adhesion resection  . Chronic back pain   . Colon cancer ([place]) 1988 and 1989   adenocarcinoma, s/p resection x  2 and chemo  . Diverticulitis   . GERD (gastroesophageal reflux disease)   . H/O open leg wound   . Hiatal hernia   . Hypercholesteremia   . Hypertension   . Lumbar scoliosis   . OA (osteoarthritis)   . Peripheral neuropathy    . Recurrent sinus infections   . Renal cyst   . S/P chemotherapy, time since greater than 12 weeks    colon cancer  . Vertigo     Surgical History: Past Surgical History:  Procedure Laterality Date  . ABDOMINAL HYSTERECTOMY  1982  . adhesions resected     bowel obstruction  . APPENDECTOMY    . BREAST BIOPSY Left    negative 06/14/1985  . CHOLECYSTECTOMY  1994  . COLON RESECTION     x2.  s/p colon cancer  . [place]  . EXCISIONAL HEMORRHOIDECTOMY     with tubal ligation  . EXPLORATORY LAPAROTOMY  1991   Secondary to SBO    Home Medications:  Allergies as of 01/02/2019      Reactions   Fentanyl Other (See Comments)   confusion      Medication List       Accurate as of January 02, 2019 12:08 PM. If you have any questions, ask your nurse or doctor.        STOP taking these medications   irbesartan 150 MG tablet Commonly known as: Avapro Stopped by: Zara Council, PA-C   meclizine 25 MG tablet Commonly known as: ANTIVERT Stopped by: Zara Council, PA-C  meloxicam 7.5 MG tablet Commonly known as: MOBIC Stopped by: Zakery Normington, PA-C   metoCLOPramide 5 MG tablet Commonly known as: REGLAN Stopped by: Zara Council, PA-C     TAKE these medications   amLODipine 5 MG tablet Commonly known as: NORVASC Take 1 tablet (5 mg total) by mouth daily.   CALCIUM 600 + D PO Take 600 Units by mouth 2 (two) times daily with a meal.   D2000 Ultra Strength 50 MCG (2000 UT) Caps Generic drug: Cholecalciferol Take by mouth.   fexofenadine 60 MG tablet Commonly known as: ALLEGRA TAKE 1 TABLET EVERY DAY   gabapentin 100 MG capsule Commonly known as: NEURONTIN Take 1 capsule (100 mg total) by mouth at bedtime.   metoprolol succinate 25 MG 24 hr tablet Commonly known as: TOPROL-XL TAKE 1 TABLET EVERY DAY   ondansetron 4 MG tablet Commonly known as: Zofran Take 1 tablet (4 mg total) by mouth 2 (two) times daily as  needed for nausea or vomiting.   oxyCODONE 5 MG immediate release tablet Commonly known as: Oxy IR/ROXICODONE Take 5 mg by mouth 3 (three) times daily.   pantoprazole 40 MG tablet Commonly known as: PROTONIX Take 40 mg by mouth 2 (two) times daily.   simvastatin 10 MG tablet Commonly known as: ZOCOR Take 1 tablet (10 mg total) by mouth at bedtime.   traMADol-acetaminophen 37.5-325 MG tablet Commonly known as: ULTRACET Take 1 tablet by mouth 2 (two) times daily as needed.       Allergies:  Allergies  Allergen Reactions  . Fentanyl Other (See Comments)    confusion    Family History: Family History  Problem Relation Age of Onset  . Breast cancer Mother   . Asthma Mother   . Stroke Father   . Hypertension Father   . Diabetes Father   . Ovarian cancer Sister        x2  . Prostate cancer Brother   . Hypertension Brother        x3  . Heart disease Brother   . Hypercholesterolemia Brother        x3  . Diabetes Brother   . Spina bifida Grandchild   . Hematuria Neg Hx   . Kidney cancer Neg Hx   . Kidney disease Neg Hx   . Sickle cell trait Neg Hx   . Tuberculosis Neg Hx     Social History:  reports that she has never smoked. She has never used smokeless tobacco. She reports that she does not drink alcohol or use drugs.  ROS: UROLOGY Frequent Urination?: Yes Hard to postpone urination?: Yes Burning/pain with urination?: No Get up at night to urinate?: Yes Leakage of urine?: No Urine stream starts and stops?: Yes Trouble starting stream?: No Do you have to strain to urinate?: No Blood in urine?: No Urinary tract infection?: No Sexually transmitted disease?: No Injury to kidneys or bladder?: No Painful intercourse?: No Weak stream?: No Currently pregnant?: No Vaginal bleeding?: No Last menstrual period?: n  Gastrointestinal Nausea?: No Vomiting?: No Indigestion/heartburn?: Yes Diarrhea?: Yes Constipation?: Yes  Constitutional Fever: No Night  sweats?: No Weight loss?: No Fatigue?: No  Skin Skin rash/lesions?: No Itching?: No  Eyes Blurred vision?: No Double vision?: No  Ears/Nose/Throat Sore throat?: No Sinus problems?: No  Hematologic/Lymphatic Swollen glands?: No Easy bruising?: No  Cardiovascular Leg swelling?: Yes Chest pain?: No  Respiratory Cough?: No Shortness of breath?: No  Endocrine Excessive thirst?: No  Musculoskeletal Back pain?: Yes Joint pain?: Yes  Neurological Headaches?: No Dizziness?: No  Psychologic Depression?: No Anxiety?: No  Physical Exam: BP (!) 197/64   Pulse (!) 55   Ht 5\' 3"  (1.6 m)   Wt 145 lb (65.8 kg)   BMI 25.69 kg/m   Constitutional:  Well nourished. Alert and oriented, No acute distress. HEENT: [place] AT, moist mucus membranes.  Trachea midline, no masses. Cardiovascular: No clubbing, cyanosis, or bilateral pedal edema.  Compression hose in place. Respiratory: Normal respiratory effort, no increased work of breathing. Neurologic: Grossly intact, no focal deficits, moving all 4 extremities. Psychiatric: Normal mood and affect.  Laboratory Data: Lab Results  Component Value Date   WBC 7.3 12/03/2018   HGB 14.0 12/03/2018   HCT 41.6 12/03/2018   MCV 95.8 12/03/2018   PLT 273.0 12/03/2018    Lab Results  Component Value Date   CREATININE 0.86 12/03/2018    No results found for: PSA  No results found for: TESTOSTERONE  Lab Results  Component Value Date   HGBA1C 5.5 12/03/2018    Lab Results  Component Value Date   TSH 2.36 12/03/2018       Component Value Date/Time   CHOL 173 12/03/2018 1034   HDL 77.10 12/03/2018 1034   CHOLHDL 2 12/03/2018 1034   VLDL 24.0 12/03/2018 1034   LDLCALC 72 12/03/2018 1034    Lab Results  Component Value Date   AST 18 12/03/2018   Lab Results  Component Value Date   ALT 11 12/03/2018   No components found for: ALKALINEPHOPHATASE No components found for: BILIRUBINTOTAL  No results found for:  ESTRADIOL  Urinalysis    Component Value Date/Time   COLORURINE STRAW (A) 06/07/2018 0944   APPEARANCEUR CLEAR (A) 06/07/2018 0944   LABSPEC 1.006 06/07/2018 [place] 6.0 06/07/2018 [place] 06/07/2018 [place] 04/04/2018 [place] 06/07/2018 [place] NEGATIVE 06/07/2018 0944   [place] 06/07/2018 0944   PROTEINUR NEGATIVE 06/07/2018 0944   UROBILINOGEN 0.2 04/04/2018 1255   NITRITE NEGATIVE 06/07/2018 0944   LEUKOCYTESUR NEGATIVE 06/07/2018 0944    I have reviewed the labs.   Pertinent Imaging: Results for SINCERITY, CATHCART (MRN [id number]) as of 01/02/2019 13:29  Ref. Range 01/02/2019 13:29  Scan Result Unknown 0 mL     Assessment & Plan:    1. OAB Failed Myrbetriq and PTNS Not a candidate for anticholinergics due to age Will have an appointment with Dr. Matilde Sprang for further evaluation  2. Nocturia Advised patient to raise her legs above heart level while sitting to reduce pedal edema Encouraged her to become more ambulatory by walking around the house every hour while awake Asked if she would speak with Dr. Nicki Reaper about the possibility of undergoing a sleep study to evaluate her for sleep apnea as greater than 80% of patients with nocturia have undiagnosed sleep apnea  Return for appointment with Dr.MacDiarmid .  These notes generated with voice recognition software. I apologize for typographical errors.  Zara Council, PA-C  [facility] [address]  [place],  [address] [phone]

## 2019-01-02 ENCOUNTER — Ambulatory Visit (INDEPENDENT_AMBULATORY_CARE_PROVIDER_SITE_OTHER): Payer: Medicare HMO | Admitting: Urology

## 2019-01-02 ENCOUNTER — Encounter: Payer: Self-pay | Admitting: Urology

## 2019-01-02 ENCOUNTER — Other Ambulatory Visit: Payer: Self-pay

## 2019-01-02 VITALS — BP 197/64 | HR 55 | Ht 63.0 in | Wt 145.0 lb

## 2019-01-02 DIAGNOSIS — N3281 Overactive bladder: Secondary | ICD-10-CM

## 2019-01-02 DIAGNOSIS — R351 Nocturia: Secondary | ICD-10-CM | POA: Diagnosis not present

## 2019-01-02 LAB — BLADDER SCAN AMB NON-IMAGING: Scan Result: 0

## 2019-01-21 ENCOUNTER — Encounter: Payer: Self-pay | Admitting: Urology

## 2019-01-21 ENCOUNTER — Ambulatory Visit (INDEPENDENT_AMBULATORY_CARE_PROVIDER_SITE_OTHER): Payer: Medicare HMO | Admitting: Urology

## 2019-01-21 ENCOUNTER — Ambulatory Visit: Payer: Self-pay

## 2019-01-21 ENCOUNTER — Other Ambulatory Visit: Payer: Self-pay

## 2019-01-21 VITALS — BP 178/76 | HR 64 | Ht 63.0 in | Wt 145.0 lb

## 2019-01-21 DIAGNOSIS — N3946 Mixed incontinence: Secondary | ICD-10-CM | POA: Diagnosis not present

## 2019-01-21 MED ORDER — OXYBUTYNIN CHLORIDE ER 10 MG PO TB24: 10.0000 mg | ORAL_TABLET | Freq: Every day | ORAL | 11 refills | Status: DC

## 2019-01-21 NOTE — Progress Notes (Signed)
01/21/2019 1:41 PM   [name] [birth year] [id number]  Referring provider: [name], [id number] [place],  [place] [address]  No chief complaint on file.   HPI: Patient seen by Dr. Chauncey Cruel in June 2020 for urinary retention in frail elderly lady who is on oxybutynin and narcotics.  Subsequently had a accessed trial of voiding.  He did not want to try more antimuscarinics and gave her Myrbetriq.  She has been treated with percutaneous tibial nerve stimulation  Patient voids every 2 or 3 hours.  She gets up 3-5 times at night.  She wears a least 2 pads a day sometimes moderately wet  Patient failed Myrbetriq.  She failed percutaneous tibial nerve stimulation.  A distant urine culture was negative  A bladder scan residual recently was 0 mL  Modifying factors: There are no other modifying factors  Associated signs and symptoms: There are no other associated signs and symptoms Aggravating and relieving factors: There are no other aggravating or relieving factors Severity: Moderate Duration: Persistent   PMH: Past Medical History:  Diagnosis Date  . Anemia   . Barrett's esophagus   . Bowel obstruction (HCC)    s/p adhesion resection  . Chronic back pain   . Colon cancer ([place]) 1988 and 1989   adenocarcinoma, s/p resection x  2 and chemo  . Diverticulitis   . GERD (gastroesophageal reflux disease)   . H/O open leg wound   . Hiatal hernia   . Hypercholesteremia   . Hypertension   . Lumbar scoliosis   . OA (osteoarthritis)   . Peripheral neuropathy   . Recurrent sinus infections   . Renal cyst   . S/P chemotherapy, time since greater than 12 weeks    colon cancer  . Vertigo     Surgical History: Past Surgical History:  Procedure Laterality Date  . ABDOMINAL HYSTERECTOMY  1982  . adhesions resected     bowel obstruction  . APPENDECTOMY    . BREAST BIOPSY Left    negative 06/14/1985  . CHOLECYSTECTOMY  1994  . COLON RESECTION     x2.   s/p colon cancer  . [place]  . EXCISIONAL HEMORRHOIDECTOMY     with tubal ligation  . EXPLORATORY LAPAROTOMY  1991   Secondary to SBO    Home Medications:  Allergies as of 01/21/2019      Reactions   Fentanyl Other (See Comments)   confusion      Medication List       Accurate as of January 21, 2019  1:41 PM. If you have any questions, ask your nurse or doctor.        amLODipine 5 MG tablet Commonly known as: NORVASC Take 1 tablet (5 mg total) by mouth daily.   CALCIUM 600 + D PO Take 600 Units by mouth 2 (two) times daily with a meal.   D2000 Ultra Strength 50 MCG (2000 UT) Caps Generic drug: Cholecalciferol Take by mouth.   fexofenadine 60 MG tablet Commonly known as: ALLEGRA TAKE 1 TABLET EVERY DAY   gabapentin 100 MG capsule Commonly known as: NEURONTIN Take 1 capsule (100 mg total) by mouth at bedtime.   metoprolol succinate 25 MG 24 hr tablet Commonly known as: TOPROL-XL TAKE 1 TABLET EVERY DAY   ondansetron 4 MG tablet Commonly known as: Zofran Take 1 tablet (4 mg total) by mouth 2 (two) times daily as needed for  nausea or vomiting.   oxyCODONE 5 MG immediate release tablet Commonly known as: Oxy IR/ROXICODONE Take 5 mg by mouth 3 (three) times daily.   pantoprazole 40 MG tablet Commonly known as: PROTONIX Take 40 mg by mouth 2 (two) times daily.   simvastatin 10 MG tablet Commonly known as: ZOCOR Take 1 tablet (10 mg total) by mouth at bedtime.   traMADol-acetaminophen 37.5-325 MG tablet Commonly known as: ULTRACET Take 1 tablet by mouth 2 (two) times daily as needed.       Allergies:  Allergies  Allergen Reactions  . Fentanyl Other (See Comments)    confusion    Family History: Family History  Problem Relation Age of Onset  . Breast cancer Mother   . Asthma Mother   . Stroke Father   . Hypertension Father   . Diabetes Father   . Ovarian cancer Sister        x2  . Prostate cancer  Brother   . Hypertension Brother        x3  . Heart disease Brother   . Hypercholesterolemia Brother        x3  . Diabetes Brother   . Spina bifida Grandchild   . Hematuria Neg Hx   . Kidney cancer Neg Hx   . Kidney disease Neg Hx   . Sickle cell trait Neg Hx   . Tuberculosis Neg Hx     Social History:  reports that she has never smoked. She has never used smokeless tobacco. She reports that she does not drink alcohol or use drugs.  ROS:                                        Physical Exam: There were no vitals taken for this visit.  Constitutional:  Alert and oriented, No acute distress.   Laboratory Data: Lab Results  Component Value Date   WBC 7.3 12/03/2018   HGB 14.0 12/03/2018   HCT 41.6 12/03/2018   MCV 95.8 12/03/2018   PLT 273.0 12/03/2018    Lab Results  Component Value Date   CREATININE 0.86 12/03/2018    No results found for: PSA  No results found for: TESTOSTERONE  Lab Results  Component Value Date   HGBA1C 5.5 12/03/2018    Urinalysis    Component Value Date/Time   COLORURINE STRAW (A) 06/07/2018 0944   APPEARANCEUR CLEAR (A) 06/07/2018 0944   LABSPEC 1.006 06/07/2018 0944   PHURINE 6.0 06/07/2018 [place] 06/07/2018 0944   GLUCOSEU NEGATIVE 04/04/2018 1255   [place] 06/07/2018 0944   BILIRUBINUR NEGATIVE 06/07/2018 0944   KETONESUR NEGATIVE 06/07/2018 0944   PROTEINUR NEGATIVE 06/07/2018 0944   UROBILINOGEN 0.2 04/04/2018 1255   NITRITE NEGATIVE 06/07/2018 0944   LEUKOCYTESUR NEGATIVE 06/07/2018 0944    Pertinent Imaging:   Assessment & Plan: Patient has reached the end of the treatment algorithm.  Antimuscarinics and potential risk of retention discussed.  We had a discussion about the risk of constipation and dry mouth and primary or secondary urinary retention.  She would like to try oxybutynin ER 10 mg to see nurse practitioner with a residual 1 month.  This fails I will try one  last antimuscarinic and check another residual in a month.  If these 2 medications fail or have side effects or retention she has reached the end of the algorithm and should not  have InterStim or Botox  There are no diagnoses linked to this encounter.  No follow-ups on file.  Reece Packer, MD  [facility] [address], [place], [place] [address] [phone]

## 2019-01-21 NOTE — Telephone Encounter (Signed)
BP elevated. Elevated Dec 1 Med was changed to a lower dose and now BP elevated. Was taking 10 mg Amlodipine and was changed to 5 mg then noted that BP began to rise. Denies any other symptoms and stated that she feels fine. Care advice given and pt verbalized understanding. Appt made for Wednesday with PCP.  Reason for Disposition . [id number] Systolic BP  >= [id number] OR Diastolic >= 80 AND [id number] taking BP medications  Answer Assessment - Initial Assessment Questions 1. BLOOD PRESSURE: "What is the blood pressure?" "Did you take at least two measurements 5 minutes apart?"     156/95 155/99  2. ONSET: "When did you take your blood pressure?"     This morning 3. HOW: "How did you obtain the blood pressure?" (e.g., visiting nurse, automatic home BP monitor)     Home monitor 4. HISTORY: "Do you have a history of high blood pressure?"     yes 5. MEDICATIONS: "Are you taking any medications for blood pressure?" "Have you missed any doses recently?"     Yes-no 6. OTHER SYMPTOMS: "Do you have any symptoms?" (e.g., headache, chest pain, blurred vision, difficulty breathing, weakness)     no 7. PREGNANCY: "Is there any chance you are pregnant?" "When was your last menstrual period?"     n/a  Protocols used: HIGH BLOOD PRESSURE-A-AH

## 2019-01-23 ENCOUNTER — Ambulatory Visit (INDEPENDENT_AMBULATORY_CARE_PROVIDER_SITE_OTHER): Payer: Medicare HMO | Admitting: Internal Medicine

## 2019-01-23 ENCOUNTER — Other Ambulatory Visit: Payer: Self-pay

## 2019-01-23 DIAGNOSIS — M545 Low back pain: Secondary | ICD-10-CM

## 2019-01-23 DIAGNOSIS — R600 Localized edema: Secondary | ICD-10-CM | POA: Diagnosis not present

## 2019-01-23 DIAGNOSIS — G8929 Other chronic pain: Secondary | ICD-10-CM

## 2019-01-23 DIAGNOSIS — I10 Essential (primary) hypertension: Secondary | ICD-10-CM

## 2019-01-23 NOTE — Progress Notes (Signed)
Patient ID: [name], female   DOB: Dec 03, 1928, 85 y.o.   MRN: [id number]   Virtual Visit via video Note  This visit type was conducted due to national recommendations for restrictions regarding the COVID-19 pandemic (e.g. social distancing).  This format is felt to be most appropriate for this patient at this time.  All issues noted in this document were discussed and addressed.  No physical exam was performed (except for noted visual exam findings with Video Visits).   I connected with [name] by a video enabled telemedicine application and verified that I am speaking with the correct person using two identifiers. Location patient: home Location provider: work Persons participating in the virtual visit: patient, provider, husband and daughter Jenny Reichmann)   I discussed the limitations, risks, security and privacy concerns of performing an evaluation and management service by telephone and the availability of in person appointments. The patient expressed understanding and agreed to proceed.   Reason for visit: work in appt  HPI: Husband called in concerned regarding Ms Bostrom's elevated blood pressure.  States has been averaging 151-159/80-90s.  Taking amlodipine.  Was having lower extremity swelling.  Decreased amlodipine to 5mg  q day.  Swelling has improved. Taking avapro 75mg  q day.  Blood pressure today improved - [id number] systolic today.  No chest pain.  Breathing stable.  No abdominal pain.  Bowels moving.  Sees Dr Sharlet Salina for chronic back pain.  Planning for ESI.  Did fall two days ago.  Slid on a rug.  Scraped her leg on her walker.  Husband is cleaning and changing dressing.     ROS: See pertinent positives and negatives per HPI.  Past Medical History:  Diagnosis Date  . Anemia   . Barrett's esophagus   . Bowel obstruction (HCC)    s/p adhesion resection  . Chronic back pain   . Colon cancer ([place]) 1988 and 1989   adenocarcinoma, s/p resection x  2 and chemo  . Diverticulitis    . GERD (gastroesophageal reflux disease)   . H/O open leg wound   . Hiatal hernia   . Hypercholesteremia   . Hypertension   . Lumbar scoliosis   . OA (osteoarthritis)   . Peripheral neuropathy   . Recurrent sinus infections   . Renal cyst   . S/P chemotherapy, time since greater than 12 weeks    colon cancer  . Vertigo     Past Surgical History:  Procedure Laterality Date  . ABDOMINAL HYSTERECTOMY  1982  . adhesions resected     bowel obstruction  . APPENDECTOMY    . BREAST BIOPSY Left    negative 06/14/1985  . CHOLECYSTECTOMY  1994  . COLON RESECTION     x2.  s/p colon cancer  . [place]  . EXCISIONAL HEMORRHOIDECTOMY     with tubal ligation  . EXPLORATORY LAPAROTOMY  1991   Secondary to SBO    Family History  Problem Relation Age of Onset  . Breast cancer Mother   . Asthma Mother   . Stroke Father   . Hypertension Father   . Diabetes Father   . Ovarian cancer Sister        x2  . Prostate cancer Brother   . Hypertension Brother        x3  . Heart disease Brother   . Hypercholesterolemia Brother        x3  . Diabetes Brother   .  Spina bifida Grandchild   . Hematuria Neg Hx   . Kidney cancer Neg Hx   . Kidney disease Neg Hx   . Sickle cell trait Neg Hx   . Tuberculosis Neg Hx     SOCIAL HX: reviewed.    Current Outpatient Medications:  .  amLODipine (NORVASC) 5 MG tablet, Take 1 tablet (5 mg total) by mouth daily., Disp: 30 tablet, Rfl: 2 .  Calcium Carbonate-Vitamin D (CALCIUM 600 + D PO), Take 600 Units by mouth 2 (two) times daily with a meal. , Disp: , Rfl:  .  Cholecalciferol (D2000 ULTRA STRENGTH) 50 MCG (2000 UT) CAPS, Take by mouth., Disp: , Rfl:  .  fexofenadine (ALLEGRA) 60 MG tablet, TAKE 1 TABLET EVERY DAY, Disp: 90 tablet, Rfl: 3 .  gabapentin (NEURONTIN) 100 MG capsule, Take 1 capsule (100 mg total) by mouth at bedtime., Disp: 90 capsule, Rfl: 1 .  metoprolol succinate (TOPROL-XL) 25 MG 24 hr tablet,  TAKE 1 TABLET EVERY DAY, Disp: 90 tablet, Rfl: 1 .  ondansetron (ZOFRAN) 4 MG tablet, Take 1 tablet (4 mg total) by mouth 2 (two) times daily as needed for nausea or vomiting., Disp: 30 tablet, Rfl: 0 .  oxybutynin (DITROPAN-XL) 10 MG 24 hr tablet, Take 1 tablet (10 mg total) by mouth daily., Disp: 30 tablet, Rfl: 11 .  oxyCODONE (OXY IR/ROXICODONE) 5 MG immediate release tablet, Take 5 mg by mouth 3 (three) times daily. , Disp: , Rfl:  .  pantoprazole (PROTONIX) 40 MG tablet, Take 40 mg by mouth 2 (two) times daily., Disp: , Rfl:  .  simvastatin (ZOCOR) 10 MG tablet, Take 1 tablet (10 mg total) by mouth at bedtime., Disp: 90 tablet, Rfl: 3 .  traMADol-acetaminophen (ULTRACET) 37.5-325 MG per tablet, Take 1 tablet by mouth 2 (two) times daily as needed. , Disp: , Rfl:   EXAM:  GENERAL: alert, oriented, appears well and in no acute distress  HEENT: atraumatic, conjunttiva clear, no obvious abnormalities on inspection of external nose and ears  NECK: normal movements of the head and neck  LUNGS: on inspection no signs of respiratory distress, breathing rate appears normal, no obvious gross SOB, gasping or wheezing  CV: no obvious cyanosis  PSYCH/NEURO: pleasant and cooperative, no obvious depression or anxiety, speech and thought processing grossly intact  ASSESSMENT AND PLAN:  Discussed the following assessment and plan:  Bilateral lower extremity edema Improved with decreasing amlodipine to 5mg  q day.    Chronic back pain Followed by Dr Sharlet Salina.  Planning for ESI.  Stable.   Hypertension Blood pressure has been elevated.  Today improved.  Systolic - [id number] today.  Continue current medication regimen.  Have them spot check her pressure and send in readings.  If persistent elevation, increase avapro to 150mg  q day.  Follow pressures.      I discussed the assessment and treatment plan with the patient. The patient was provided an opportunity to ask questions and all were answered. The  patient agreed with the plan and demonstrated an understanding of the instructions.   The patient was advised to call back or seek an in-person evaluation if the symptoms worsen or if the condition fails to improve as anticipated.   [name], MD

## 2019-01-24 ENCOUNTER — Telehealth: Payer: Self-pay

## 2019-01-24 NOTE — Telephone Encounter (Signed)
Copied from [place] [id number]. Topic: General - Other >> Jan 24, 2019  3:48 PM Keene Breath wrote: Reason for CRM: Patient's husband called to give the patient's BP readings since yesterday - 12/9 morning 118/61, afternoon 127/68, 12/10 morning 150/86, noon 126/69, this afternoon 148/82.  Please advise and call to discuss at [phone]

## 2019-01-24 NOTE — Telephone Encounter (Signed)
Continue on 75mg  avapro and send in readings tomorrow.  Will then determine if need to increase to 150mg  q day.

## 2019-01-24 NOTE — Telephone Encounter (Signed)
Patient and husband are aware.

## 2019-01-25 NOTE — Telephone Encounter (Signed)
BP readings 10am 161/97  1:30pm 143/80

## 2019-01-25 NOTE — Telephone Encounter (Signed)
Pt aware.

## 2019-01-25 NOTE — Telephone Encounter (Signed)
BP readings for today below

## 2019-01-25 NOTE — Telephone Encounter (Signed)
Thank them for checking.  Let's go ahead and increase avapro to 150mg  q day.  Please send in new rx.  She can take two 75mg  tablets until run out.  Continue to monitor pressures and send in readins.

## 2019-01-26 ENCOUNTER — Encounter: Payer: Self-pay | Admitting: Internal Medicine

## 2019-01-27 NOTE — Assessment & Plan Note (Signed)
Followed by Dr Sharlet Salina.  Planning for ESI.  Stable.

## 2019-01-27 NOTE — Assessment & Plan Note (Signed)
Blood pressure has been elevated.  Today improved.  Systolic - [id number] today.  Continue current medication regimen.  Have them spot check her pressure and send in readings.  If persistent elevation, increase avapro to 150mg  q day.  Follow pressures.

## 2019-01-27 NOTE — Assessment & Plan Note (Signed)
Improved with decreasing amlodipine to 5mg  q day.

## 2019-01-28 ENCOUNTER — Other Ambulatory Visit: Payer: Self-pay

## 2019-01-28 MED ORDER — IRBESARTAN 150 MG PO TABS: 150.0000 mg | ORAL_TABLET | Freq: Every day | ORAL | 1 refills | Status: DC

## 2019-01-29 DIAGNOSIS — K219 Gastro-esophageal reflux disease without esophagitis: Secondary | ICD-10-CM | POA: Diagnosis not present

## 2019-01-29 DIAGNOSIS — K5909 Other constipation: Secondary | ICD-10-CM | POA: Diagnosis not present

## 2019-01-29 DIAGNOSIS — Z8719 Personal history of other diseases of the digestive system: Secondary | ICD-10-CM | POA: Diagnosis not present

## 2019-01-29 DIAGNOSIS — K3184 Gastroparesis: Secondary | ICD-10-CM | POA: Diagnosis not present

## 2019-01-29 DIAGNOSIS — Z85038 Personal history of other malignant neoplasm of large intestine: Secondary | ICD-10-CM | POA: Diagnosis not present

## 2019-02-06 ENCOUNTER — Ambulatory Visit: Payer: Medicare HMO | Admitting: Internal Medicine

## 2019-02-25 ENCOUNTER — Encounter: Payer: Self-pay | Admitting: Urology

## 2019-02-25 ENCOUNTER — Other Ambulatory Visit: Payer: Self-pay

## 2019-02-25 ENCOUNTER — Ambulatory Visit: Payer: Medicare HMO | Admitting: Urology

## 2019-02-25 VITALS — BP 186/70 | HR 82 | Ht 62.0 in | Wt 145.0 lb

## 2019-02-25 DIAGNOSIS — N3946 Mixed incontinence: Secondary | ICD-10-CM

## 2019-02-25 LAB — BLADDER SCAN AMB NON-IMAGING

## 2019-02-25 MED ORDER — SOLIFENACIN SUCCINATE 5 MG PO TABS: 5.0000 mg | ORAL_TABLET | Freq: Every day | ORAL | 11 refills | Status: DC

## 2019-02-25 NOTE — Progress Notes (Signed)
02/25/2019 2:58 PM   [name] [birth year] [id number]  Referring provider: [name], [id number] [place],  [place] [id number]  Chief Complaint  Patient presents with  . Follow-up    HPI: Patient seen by Dr. Chauncey Cruel in June 2020 for urinary retention in frail elderly lady who is on oxybutynin and narcotics.  Subsequently had a accessed trial of voiding.  He did not want to try more antimuscarinics and gave her Myrbetriq.  She has been treated with percutaneous tibial nerve stimulation  Patient voids every 2 or 3 hours.  She gets up 3-5 times at night.  She wears a least 2 pads a day sometimes moderately wet  Patient failed Myrbetriq.  She failed percutaneous tibial nerve stimulation.    Patient has reached the end of the treatment algorithm.  Antimuscarinics and potential risk of retention discussed.  We had a discussion about the risk of constipation and dry mouth and primary or secondary urinary retention.  She would like to try oxybutynin ER 10 mg to see nurse practitioner with a residual 1 month.  This fails I will try one last antimuscarinic and check another residual in a month.  If these 2 medications fail or have side effects or retention she has reached the end of the algorithm and should not have InterStim or Botox  Today Consider stable.  No benefit on incontinence.  Clinically not infected   PMH: Past Medical History:  Diagnosis Date  . Anemia   . Barrett's esophagus   . Bowel obstruction (HCC)    s/p adhesion resection  . Chronic back pain   . Colon cancer ([place]) 1988 and 1989   adenocarcinoma, s/p resection x  2 and chemo  . Diverticulitis   . GERD (gastroesophageal reflux disease)   . H/O open leg wound   . Hiatal hernia   . Hypercholesteremia   . Hypertension   . Lumbar scoliosis   . OA (osteoarthritis)   . Peripheral neuropathy   . Recurrent sinus infections   . Renal cyst   . S/P chemotherapy, time since greater than 12  weeks    colon cancer  . Vertigo     Surgical History: Past Surgical History:  Procedure Laterality Date  . ABDOMINAL HYSTERECTOMY  1982  . adhesions resected     bowel obstruction  . APPENDECTOMY    . BREAST BIOPSY Left    negative 06/14/1985  . CHOLECYSTECTOMY  1994  . COLON RESECTION     x2.  s/p colon cancer  . [place]  . EXCISIONAL HEMORRHOIDECTOMY     with tubal ligation  . EXPLORATORY LAPAROTOMY  1991   Secondary to SBO    Home Medications:  Allergies as of 02/25/2019      Reactions   Fentanyl Other (See Comments)   confusion      Medication List       Accurate as of February 25, 2019  2:58 PM. If you have any questions, ask your nurse or doctor.        amLODipine 10 MG tablet Commonly known as: NORVASC What changed: Another medication with the same name was removed. Continue taking this medication, and follow the directions you see here. Changed by: Reece Packer, MD   CALCIUM 600 + D PO Take 600 Units by mouth 2 (two) times daily with a meal.   D2000 Ultra Strength 50 MCG (2000 UT) Caps Generic  drug: Cholecalciferol Take by mouth.   fexofenadine 60 MG tablet Commonly known as: ALLEGRA TAKE 1 TABLET EVERY DAY   gabapentin 100 MG capsule Commonly known as: NEURONTIN Take 1 capsule (100 mg total) by mouth at bedtime.   irbesartan 150 MG tablet Commonly known as: Avapro Take 1 tablet (150 mg total) by mouth daily.   metoprolol succinate 25 MG 24 hr tablet Commonly known as: TOPROL-XL TAKE 1 TABLET EVERY DAY   ondansetron 4 MG tablet Commonly known as: Zofran Take 1 tablet (4 mg total) by mouth 2 (two) times daily as needed for nausea or vomiting.   oxybutynin 10 MG 24 hr tablet Commonly known as: DITROPAN-XL Take 1 tablet (10 mg total) by mouth daily.   oxyCODONE 5 MG immediate release tablet Commonly known as: Oxy IR/ROXICODONE Take 5 mg by mouth 3 (three) times daily.   pantoprazole 40 MG  tablet Commonly known as: PROTONIX Take 40 mg by mouth 2 (two) times daily.   simvastatin 10 MG tablet Commonly known as: ZOCOR Take 1 tablet (10 mg total) by mouth at bedtime.   traMADol-acetaminophen 37.5-325 MG tablet Commonly known as: ULTRACET Take 1 tablet by mouth 2 (two) times daily as needed.       Allergies:  Allergies  Allergen Reactions  . Fentanyl Other (See Comments)    confusion    Family History: Family History  Problem Relation Age of Onset  . Breast cancer Mother   . Asthma Mother   . Stroke Father   . Hypertension Father   . Diabetes Father   . Ovarian cancer Sister        x2  . Prostate cancer Brother   . Hypertension Brother        x3  . Heart disease Brother   . Hypercholesterolemia Brother        x3  . Diabetes Brother   . Spina bifida Grandchild   . Hematuria Neg Hx   . Kidney cancer Neg Hx   . Kidney disease Neg Hx   . Sickle cell trait Neg Hx   . Tuberculosis Neg Hx     Social History:  reports that she has never smoked. She has never used smokeless tobacco. She reports that she does not drink alcohol or use drugs.  ROS: UROLOGY Frequent Urination?: Yes Hard to postpone urination?: Yes Burning/pain with urination?: No Get up at night to urinate?: Yes Leakage of urine?: No Urine stream starts and stops?: No Trouble starting stream?: No Do you have to strain to urinate?: No Blood in urine?: No Urinary tract infection?: No Sexually transmitted disease?: No Injury to kidneys or bladder?: No Painful intercourse?: No Weak stream?: No Currently pregnant?: No Vaginal bleeding?: No Last menstrual period?: N  Gastrointestinal Nausea?: No Vomiting?: No Indigestion/heartburn?: No Diarrhea?: No Constipation?: No  Constitutional Fever: No Night sweats?: No Weight loss?: No Fatigue?: No  Skin Skin rash/lesions?: No Itching?: No  Eyes Blurred vision?: No Double vision?: No  Ears/Nose/Throat Sore throat?: No Sinus  problems?: No  Hematologic/Lymphatic Swollen glands?: No Easy bruising?: No  Cardiovascular Leg swelling?: No Chest pain?: No  Respiratory Cough?: No Shortness of breath?: No  Endocrine Excessive thirst?: No  Musculoskeletal Back pain?: Yes Joint pain?: Yes  Neurological Headaches?: No Dizziness?: No     Physical Exam: BP (!) 186/70   Pulse 82   Ht 5\' 2"  (1.575 m)   Wt 145 lb (65.8 kg)   BMI 26.52 kg/m   Constitutional:  Alert and  oriented, No acute distress.  Laboratory Data: Lab Results  Component Value Date   WBC 7.3 12/03/2018   HGB 14.0 12/03/2018   HCT 41.6 12/03/2018   MCV 95.8 12/03/2018   PLT 273.0 12/03/2018    Lab Results  Component Value Date   CREATININE 0.86 12/03/2018    No results found for: PSA  No results found for: TESTOSTERONE  Lab Results  Component Value Date   HGBA1C 5.5 12/03/2018    Urinalysis    Component Value Date/Time   COLORURINE STRAW (A) 06/07/2018 0944   APPEARANCEUR CLEAR (A) 06/07/2018 0944   LABSPEC 1.006 06/07/2018 0944   PHURINE 6.0 06/07/2018 [place] 06/07/2018 0944   GLUCOSEU NEGATIVE 04/04/2018 1255   [place] 06/07/2018 0944   BILIRUBINUR NEGATIVE 06/07/2018 0944   KETONESUR NEGATIVE 06/07/2018 0944   PROTEINUR NEGATIVE 06/07/2018 0944   UROBILINOGEN 0.2 04/04/2018 1255   NITRITE NEGATIVE 06/07/2018 0944   LEUKOCYTESUR NEGATIVE 06/07/2018 0944    Pertinent Imaging:   Assessment & Plan: Patient failed antimuscarinic.  Residual today 10 mL.  Send prescription of Vesicare 5 mg and check residual in 6 or 7 weeks.  We have reached the end of the algorithm  There are no diagnoses linked to this encounter.  No follow-ups on file.  Reece Packer, MD  [facility] [address] [place], [place] [address] [phone]

## 2019-02-28 ENCOUNTER — Ambulatory Visit (INDEPENDENT_AMBULATORY_CARE_PROVIDER_SITE_OTHER): Payer: Medicare HMO | Admitting: Internal Medicine

## 2019-02-28 ENCOUNTER — Other Ambulatory Visit: Payer: Self-pay

## 2019-02-28 ENCOUNTER — Encounter: Payer: Self-pay | Admitting: Internal Medicine

## 2019-02-28 DIAGNOSIS — D509 Iron deficiency anemia, unspecified: Secondary | ICD-10-CM

## 2019-02-28 DIAGNOSIS — K22719 Barrett's esophagus with dysplasia, unspecified: Secondary | ICD-10-CM

## 2019-02-28 DIAGNOSIS — E7800 Pure hypercholesterolemia, unspecified: Secondary | ICD-10-CM | POA: Diagnosis not present

## 2019-02-28 DIAGNOSIS — G2581 Restless legs syndrome: Secondary | ICD-10-CM

## 2019-02-28 DIAGNOSIS — M7989 Other specified soft tissue disorders: Secondary | ICD-10-CM | POA: Diagnosis not present

## 2019-02-28 DIAGNOSIS — I10 Essential (primary) hypertension: Secondary | ICD-10-CM

## 2019-02-28 MED ORDER — IRBESARTAN 150 MG PO TABS: 150.0000 mg | ORAL_TABLET | Freq: Every day | ORAL | 1 refills | Status: DC

## 2019-02-28 MED ORDER — GABAPENTIN 100 MG PO CAPS: ORAL_CAPSULE | ORAL | 1 refills | Status: DC

## 2019-02-28 NOTE — Progress Notes (Signed)
Patient ID: [name], female   DOB: 08/24/1928, 86 y.o.   MRN: [id number]   Virtual Visit via video Note  This visit type was conducted due to national recommendations for restrictions regarding the COVID-19 pandemic (e.g. social distancing).  This format is felt to be most appropriate for this patient at this time.  All issues noted in this document were discussed and addressed.  No physical exam was performed (except for noted visual exam findings with Video Visits).   I connected with [name] by a video enabled telemedicine application and verified that I am speaking with the correct person using two identifiers. Location patient: home Location provider: work Persons participating in the virtual visit: patient, provider and pts daughter - Jenny Reichmann  The limitations, risks, security and privacy concerns of performing an evaluation and management service by video and the availability of in person appointments have been discussed.  The patient expressed understanding and agreed to proceed.   Reason for visit: scheduled follow up.   HPI: She reports she is doing relatively well.  Last visit, she was started on gabapentin 100mg  q hs.  She felt this helped initially, but now (over the last couple of weeks) has noticed some increased leg discomfort at night (and question of some restless legs).  Tolerating gabapentin. Discussed increasing the dose.  Her blood pressure has been running a little higher than goal.  Blood pressures averaging 140-150s/80s.  No chest pain or sob reported.  No chest congestion or cough.  Acid reflux controlled if does not eat late.  Seeing urology.  Just evaluated.  Started on vesicare.  Increased urination at night.  Does snore. Discussed possibility of sleep apnea.  She wants to hold on further testing at this time.  Lower extremity swelling is better.  Received her covid vaccine Monday.     ROS: See pertinent positives and negatives per HPI.  Past Medical History:   Diagnosis Date  . Anemia   . Barrett's esophagus   . Bowel obstruction (HCC)    s/p adhesion resection  . Chronic back pain   . Colon cancer ([place]) 1988 and 1989   adenocarcinoma, s/p resection x  2 and chemo  . Diverticulitis   . GERD (gastroesophageal reflux disease)   . H/O open leg wound   . Hiatal hernia   . Hypercholesteremia   . Hypertension   . Lumbar scoliosis   . OA (osteoarthritis)   . Peripheral neuropathy   . Recurrent sinus infections   . Renal cyst   . S/P chemotherapy, time since greater than 12 weeks    colon cancer  . Vertigo     Past Surgical History:  Procedure Laterality Date  . ABDOMINAL HYSTERECTOMY  1982  . adhesions resected     bowel obstruction  . APPENDECTOMY    . BREAST BIOPSY Left    negative 06/14/1985  . CHOLECYSTECTOMY  1994  . COLON RESECTION     x2.  s/p colon cancer  . [place]  . EXCISIONAL HEMORRHOIDECTOMY     with tubal ligation  . EXPLORATORY LAPAROTOMY  1991   Secondary to SBO    Family History  Problem Relation Age of Onset  . Breast cancer Mother   . Asthma Mother   . Stroke Father   . Hypertension Father   . Diabetes Father   . Ovarian cancer Sister        x2  . Prostate  cancer Brother   . Hypertension Brother        x3  . Heart disease Brother   . Hypercholesterolemia Brother        x3  . Diabetes Brother   . Spina bifida Grandchild   . Hematuria Neg Hx   . Kidney cancer Neg Hx   . Kidney disease Neg Hx   . Sickle cell trait Neg Hx   . Tuberculosis Neg Hx     SOCIAL HX: reviewed.    Current Outpatient Medications:  .  amLODipine (NORVASC) 10 MG tablet, , Disp: , Rfl:  .  Calcium Carbonate-Vitamin D (CALCIUM 600 + D PO), Take 600 Units by mouth 2 (two) times daily with a meal. , Disp: , Rfl:  .  Cholecalciferol (D2000 ULTRA STRENGTH) 50 MCG (2000 UT) CAPS, Take by mouth., Disp: , Rfl:  .  fexofenadine (ALLEGRA) 60 MG tablet, TAKE 1 TABLET EVERY DAY, Disp: 90  tablet, Rfl: 3 .  gabapentin (NEURONTIN) 100 MG capsule, Take two capsules q hs, Disp: 180 capsule, Rfl: 1 .  irbesartan (AVAPRO) 150 MG tablet, Take 1 tablet (150 mg total) by mouth daily., Disp: 90 tablet, Rfl: 1 .  metoprolol succinate (TOPROL-XL) 25 MG 24 hr tablet, TAKE 1 TABLET EVERY DAY, Disp: 90 tablet, Rfl: 1 .  ondansetron (ZOFRAN) 4 MG tablet, Take 1 tablet (4 mg total) by mouth 2 (two) times daily as needed for nausea or vomiting., Disp: 30 tablet, Rfl: 0 .  oxyCODONE (OXY IR/ROXICODONE) 5 MG immediate release tablet, Take 5 mg by mouth 3 (three) times daily. , Disp: , Rfl:  .  pantoprazole (PROTONIX) 40 MG tablet, Take 40 mg by mouth 2 (two) times daily., Disp: , Rfl:  .  simvastatin (ZOCOR) 10 MG tablet, Take 1 tablet (10 mg total) by mouth at bedtime., Disp: 90 tablet, Rfl: 3 .  solifenacin (VESICARE) 5 MG tablet, Take 1 tablet (5 mg total) by mouth daily., Disp: 30 tablet, Rfl: 11 .  traMADol-acetaminophen (ULTRACET) 37.5-325 MG per tablet, Take 1 tablet by mouth 2 (two) times daily as needed. , Disp: , Rfl:   EXAM:  VITALS per patient if applicable: 123456  GENERAL: alert, oriented, appears well and in no acute distress  HEENT: atraumatic, conjunttiva clear, no obvious abnormalities on inspection of external nose and ears  NECK: normal movements of the head and neck  LUNGS: on inspection no signs of respiratory distress, breathing rate appears normal, no obvious gross SOB, gasping or wheezing  CV: no obvious cyanosis  PSYCH/NEURO: pleasant and cooperative, no obvious depression or anxiety, speech and thought processing grossly intact  ASSESSMENT AND PLAN:  Discussed the following assessment and plan:  Anemia, iron deficiency Follow cbc.   Barrett's esophagus Followed by GI. No upper symptoms.  Acid reflux controlled if does not eat late. On protonix.    Hereditary hemochromatosis ([place]) Was evaluated by hematology.  Has declined further f/u.  Recent cbc and  ferritin wnl.  Follow.    Hypercholesteremia On simvastatin.  Low cholesterol diet and exercise.  Follow lipid panel and liver function tests.    Hypertension Blood pressure elevated.  Increase avapro to 150mg  q day.  Continue amlodipine 5mg  q day.  Follow pressures.  Send in readings.  Follow metabolic panel.   Restless legs syndrome On gabapentin.  On 100mg  q hs.  Still with discomfort.  Increase to 200mg  q hs.    Swelling of left lower extremity Swelling better on 5mg  amlodipine.  Follow.  Meds ordered this encounter  Medications  . gabapentin (NEURONTIN) 100 MG capsule    Sig: Take two capsules q hs    Dispense:  180 capsule    Refill:  1  . irbesartan (AVAPRO) 150 MG tablet    Sig: Take 1 tablet (150 mg total) by mouth daily.    Dispense:  90 tablet    Refill:  1     I discussed the assessment and treatment plan with the patient. The patient was provided an opportunity to ask questions and all were answered. The patient agreed with the plan and demonstrated an understanding of the instructions.   The patient was advised to call back or seek an in-person evaluation if the symptoms worsen or if the condition fails to improve as anticipated.   [name], MD

## 2019-03-03 ENCOUNTER — Encounter: Payer: Self-pay | Admitting: Internal Medicine

## 2019-03-03 NOTE — Assessment & Plan Note (Signed)
Swelling better on 5mg  amlodipine.  Follow.

## 2019-03-03 NOTE — Assessment & Plan Note (Signed)
On gabapentin.  On 100mg  q hs.  Still with discomfort.  Increase to 200mg  q hs.

## 2019-03-03 NOTE — Assessment & Plan Note (Signed)
Followed by GI. No upper symptoms.  Acid reflux controlled if does not eat late. On protonix.

## 2019-03-03 NOTE — Assessment & Plan Note (Signed)
Blood pressure elevated.  Increase avapro to 150mg  q day.  Continue amlodipine 5mg  q day.  Follow pressures.  Send in readings.  Follow metabolic panel.

## 2019-03-03 NOTE — Assessment & Plan Note (Signed)
Was evaluated by hematology.  Has declined further f/u.  Recent cbc and ferritin wnl.  Follow.

## 2019-03-03 NOTE — Assessment & Plan Note (Signed)
On simvastatin.  Low cholesterol diet and exercise.  Follow lipid panel and liver function tests.   

## 2019-03-03 NOTE — Assessment & Plan Note (Signed)
Follow cbc.  

## 2019-03-27 DIAGNOSIS — M5136 Other intervertebral disc degeneration, lumbar region: Secondary | ICD-10-CM | POA: Diagnosis not present

## 2019-03-27 DIAGNOSIS — Z79899 Other long term (current) drug therapy: Secondary | ICD-10-CM | POA: Diagnosis not present

## 2019-03-27 DIAGNOSIS — M5416 Radiculopathy, lumbar region: Secondary | ICD-10-CM | POA: Diagnosis not present

## 2019-03-27 DIAGNOSIS — M62830 Muscle spasm of back: Secondary | ICD-10-CM | POA: Diagnosis not present

## 2019-03-27 DIAGNOSIS — M7062 Trochanteric bursitis, left hip: Secondary | ICD-10-CM | POA: Diagnosis not present

## 2019-03-29 ENCOUNTER — Other Ambulatory Visit: Payer: Self-pay

## 2019-03-29 MED ORDER — SOLIFENACIN SUCCINATE 5 MG PO TABS: 5.0000 mg | ORAL_TABLET | Freq: Every day | ORAL | 11 refills | Status: DC

## 2019-04-02 ENCOUNTER — Telehealth: Payer: Self-pay

## 2019-04-02 NOTE — Telephone Encounter (Signed)
[name], patients daughter called Larena Glassman, call her at [phone].

## 2019-04-02 NOTE — Telephone Encounter (Signed)
Spoke with patients daughter [name]. Concerned about patients BP fluctuating and lower extremity getting worse again. No other acute symptoms noted. She is wearing her compression stockings daily and keeping her feet elevated. Daughter did note that she did not get to get her injections with Dr Sharlet Salina this time due to receiving the COVID vaccine. She cannot have them until the beginning of March. Daughter is requesting that the appt on 3/9 be in office with Dr Nicki Reaper. She is going to call me back to give some blood pressure readings and confirm how pt is taking her medication. Will determine then if ok to wait until appt to be seen or if pt should be worked in

## 2019-04-03 NOTE — Telephone Encounter (Signed)
If taking avapro 150mg  q day  - blood pressures are remaining elevated.  Would recommend increased avapro to 300mg  mg q day.  Will need new rx.  Confirm no other acute issues.  If any acute issues, will need to be seen

## 2019-04-03 NOTE — Telephone Encounter (Signed)
Patients husband stated that wife told him her pain level was low today and when they checked her BP it was 127/68. I did not increase her medication. I advised them to continue her current medication until advised otherwise. No other acute issues at this time.

## 2019-04-03 NOTE — Telephone Encounter (Signed)
Patients daughter called in to give BP readings  155/92 168/68 149/88 164/78   She has not been able to get her injections with Dr Sharlet Salina. Was due in December and is not scheduled until March. Patient stated that she is really not in anymore pain than normal. She is taking her oxycodone. No acute symptoms noted (headache, blurred vision, dizziness, SOB, chest pain, etc.) She is taking avapro 150 mg and amlodipine 10 mg daily. The swelling in her feet is normal for her. Sometimes better than others. Seems to be worse over that last week or so. Sweling is bilateral. No warmth or redness to lower extremities. Wearing compression stockings daily and elevating her feet some. (husband and daughters have to remind her) Daughter and husband would like to have an in office visit with Dr Nicki Reaper for her next visit.They are okay to wait until her scheduled appt March 9 but agreeable to move up appt if necessary.

## 2019-04-04 NOTE — Telephone Encounter (Signed)
Blood pressure on recheck improved.  Can continue to monitor and send in readings, but if remains elevated, will need to adjust medication.

## 2019-04-04 NOTE — Telephone Encounter (Signed)
Patients husband is aware of below. BP this morning was 120s/70s. He is going to continue to monitor and and with update in a week or so unless BP starts to trend back up.

## 2019-04-08 ENCOUNTER — Other Ambulatory Visit: Payer: Self-pay

## 2019-04-08 ENCOUNTER — Ambulatory Visit: Payer: Medicare HMO | Admitting: Urology

## 2019-04-08 ENCOUNTER — Encounter: Payer: Self-pay | Admitting: Urology

## 2019-04-08 ENCOUNTER — Telehealth: Payer: Self-pay

## 2019-04-08 VITALS — BP 132/64

## 2019-04-08 DIAGNOSIS — N3946 Mixed incontinence: Secondary | ICD-10-CM | POA: Diagnosis not present

## 2019-04-08 NOTE — Progress Notes (Signed)
04/08/2019 10:36 AM   [name] 1928/06/04 [id number]  Referring provider: [name], [id number] [place],  [place] [id number]  Chief Complaint  Patient presents with  . Follow-up    HPI: Patient seen by Dr. Chauncey Cruel in June 2020 for urinary retention in frail elderly lady who is on oxybutynin and narcotics. Subsequently had a accessed trial of voiding. He did not want to try more antimuscarinics and gave her Myrbetriq. She has been treated with percutaneous tibial nerve stimulation  Patient voids every 2 or 3 hours. She gets up 3-5 times at night. She wears a least 2 pads a day sometimes moderately wet  Patient failed Myrbetriq. She failed percutaneous tibial nerve stimulation.    Patient has reached the end of the treatment algorithm. Antimuscarinics and potential risk of retention discussed. We had a discussion about the risk of constipation and dry mouth and primary or secondary urinary retention. She would like to try oxybutynin ER 10 mg to see nurse practitioner with a residual 1 month. This fails I will try one last antimuscarinic and check another residual in a month. If these 2 medications fail or have side effects or retention she has reached the end of the algorithm and should not have InterStim or Botox  Today Patient failed antimuscarinic.  Residual today 10 mL.  Send prescription of Vesicare 5 mg and check residual in 6 or 7 weeks.  We have reached the end of the algorithm  Today Family thinks patient is only getting up 3-4 times a night as does the patient instead of 6-8 times.  Still leaking during the day and clinically not infected.  Not on appropriate she is on oxybutynin and Vesicare.  There was question in the past and even currently that it could make her confusion at times a little worse.  It is not definite but possible   PMH: Past Medical History:  Diagnosis Date  . Anemia   . Barrett's esophagus   . Bowel  obstruction (HCC)    s/p adhesion resection  . Chronic back pain   . Colon cancer ([place]) 1988 and 1989   adenocarcinoma, s/p resection x  2 and chemo  . Diverticulitis   . GERD (gastroesophageal reflux disease)   . H/O open leg wound   . Hiatal hernia   . Hypercholesteremia   . Hypertension   . Lumbar scoliosis   . OA (osteoarthritis)   . Peripheral neuropathy   . Recurrent sinus infections   . Renal cyst   . S/P chemotherapy, time since greater than 12 weeks    colon cancer  . Vertigo     Surgical History: Past Surgical History:  Procedure Laterality Date  . ABDOMINAL HYSTERECTOMY  1982  . adhesions resected     bowel obstruction  . APPENDECTOMY    . BREAST BIOPSY Left    negative 06/14/1985  . CHOLECYSTECTOMY  1994  . COLON RESECTION     x2.  s/p colon cancer  . [place]  . EXCISIONAL HEMORRHOIDECTOMY     with tubal ligation  . EXPLORATORY LAPAROTOMY  1991   Secondary to SBO    Home Medications:  Allergies as of 04/08/2019      Reactions   Fentanyl Other (See Comments)   confusion      Medication List       Accurate as of April 08, 2019 10:36 AM. If you have any  questions, ask your nurse or doctor.        amLODipine 10 MG tablet Commonly known as: NORVASC   CALCIUM 600 + D PO Take 600 Units by mouth 2 (two) times daily with a meal.   D2000 Ultra Strength 50 MCG (2000 UT) Caps Generic drug: Cholecalciferol Take by mouth.   fexofenadine 60 MG tablet Commonly known as: ALLEGRA TAKE 1 TABLET EVERY DAY   gabapentin 100 MG capsule Commonly known as: NEURONTIN Take two capsules q hs   irbesartan 150 MG tablet Commonly known as: Avapro Take 1 tablet (150 mg total) by mouth daily.   metoprolol succinate 25 MG 24 hr tablet Commonly known as: TOPROL-XL TAKE 1 TABLET EVERY DAY   ondansetron 4 MG tablet Commonly known as: Zofran Take 1 tablet (4 mg total) by mouth 2 (two) times daily as needed for nausea or  vomiting.   oxyCODONE 5 MG immediate release tablet Commonly known as: Oxy IR/ROXICODONE Take 5 mg by mouth 3 (three) times daily.   pantoprazole 40 MG tablet Commonly known as: PROTONIX Take 40 mg by mouth 2 (two) times daily.   simvastatin 10 MG tablet Commonly known as: ZOCOR Take 1 tablet (10 mg total) by mouth at bedtime.   solifenacin 5 MG tablet Commonly known as: VESICARE Take 1 tablet (5 mg total) by mouth daily.   traMADol-acetaminophen 37.5-325 MG tablet Commonly known as: ULTRACET Take 1 tablet by mouth 2 (two) times daily as needed.       Allergies:  Allergies  Allergen Reactions  . Fentanyl Other (See Comments)    confusion    Family History: Family History  Problem Relation Age of Onset  . Breast cancer Mother   . Asthma Mother   . Stroke Father   . Hypertension Father   . Diabetes Father   . Ovarian cancer Sister        x2  . Prostate cancer Brother   . Hypertension Brother        x3  . Heart disease Brother   . Hypercholesterolemia Brother        x3  . Diabetes Brother   . Spina bifida Grandchild   . Hematuria Neg Hx   . Kidney cancer Neg Hx   . Kidney disease Neg Hx   . Sickle cell trait Neg Hx   . Tuberculosis Neg Hx     Social History:  reports that she has never smoked. She has never used smokeless tobacco. She reports that she does not drink alcohol or use drugs.  ROS:                                        Physical Exam: There were no vitals taken for this visit.  Constitutional:  Alert and oriented, No acute distress.  Laboratory Data: Lab Results  Component Value Date   WBC 7.3 12/03/2018   HGB 14.0 12/03/2018   HCT 41.6 12/03/2018   MCV 95.8 12/03/2018   PLT 273.0 12/03/2018    Lab Results  Component Value Date   CREATININE 0.86 12/03/2018    No results found for: PSA  No results found for: TESTOSTERONE  Lab Results  Component Value Date   HGBA1C 5.5 12/03/2018    Urinalysis     Component Value Date/Time   COLORURINE STRAW (A) 06/07/2018 0944   APPEARANCEUR CLEAR (A) 06/07/2018 0944   LABSPEC 1.006  06/07/2018 [place] 6.0 06/07/2018 [place] 06/07/2018 [place] 04/04/2018 [place] 06/07/2018 [place] 06/07/2018 [place] 06/07/2018 0944   PROTEINUR NEGATIVE 06/07/2018 0944   UROBILINOGEN 0.2 04/04/2018 1255   NITRITE NEGATIVE 06/07/2018 0944   LEUKOCYTESUR NEGATIVE 06/07/2018 0944    Pertinent Imaging:   Assessment & Plan: We decided to stop the oxybutynin.  We will see how she does on Vesicare 5 mg.  Reassess in 6 months.  I think we have reached the end of treatment algorithm  There are no diagnoses linked to this encounter.  No follow-ups on file.  Reece Packer, MD  [facility] [address] [place], [place] [address] [phone]

## 2019-04-08 NOTE — Telephone Encounter (Signed)
Patients daughter [name] called to let me know that patient had a fall this weekend and cut the back of her leg. No other injuries noted. Was evaluated by EMT and did not feel she needed to be transported or needed stitches. EMT taught patient and family how to keep her leg dressed and what to monitor. Daughter says that patient has been having some episodes of feeling anxious and when she becomes anxious she states she can't breath. Resolves after about a minute. Has only happened a couple of times. Requesting to have appt with Dr Nicki Reaper moved up from 3/8. Advised that Dr Nicki Reaper is out of office until Wednesday. I confirmed nothing actively acute going on at the present time and offered appt on 2/25. She has her injections that day. Advised that if she starts having any acute symptoms- change in BP, SOB, dizziness, chest pain, etc. She should not wait to be evaluated, she should be taken to the ED ASAP. Daughter gave verbal understanding and stated again that this appt is really not for anything urgent, she has just noticed a difference over the last couple of weeks and thinks it would be good for her to come in and have a visit with Dr Nicki Reaper before March 8th. Pt was seen by urology today Are you ok to work her in on 3/1 at 12:00?

## 2019-04-09 NOTE — Telephone Encounter (Signed)
Yes I can see her earlier - 04/15/19 - 12:00.  Please confirm ok to wait until 04/15/19.

## 2019-04-09 NOTE — Telephone Encounter (Signed)
Confirmed ok to wait until 04/15/2019. Pt has been screened and scheduled.

## 2019-04-11 DIAGNOSIS — M5416 Radiculopathy, lumbar region: Secondary | ICD-10-CM | POA: Diagnosis not present

## 2019-04-11 DIAGNOSIS — M48062 Spinal stenosis, lumbar region with neurogenic claudication: Secondary | ICD-10-CM | POA: Diagnosis not present

## 2019-04-11 DIAGNOSIS — M5136 Other intervertebral disc degeneration, lumbar region: Secondary | ICD-10-CM | POA: Diagnosis not present

## 2019-04-15 ENCOUNTER — Encounter: Payer: Self-pay | Admitting: Internal Medicine

## 2019-04-15 ENCOUNTER — Ambulatory Visit (INDEPENDENT_AMBULATORY_CARE_PROVIDER_SITE_OTHER): Payer: Medicare HMO | Admitting: Internal Medicine

## 2019-04-15 ENCOUNTER — Other Ambulatory Visit: Payer: Self-pay

## 2019-04-15 DIAGNOSIS — I10 Essential (primary) hypertension: Secondary | ICD-10-CM

## 2019-04-15 DIAGNOSIS — M545 Low back pain: Secondary | ICD-10-CM

## 2019-04-15 DIAGNOSIS — E7800 Pure hypercholesterolemia, unspecified: Secondary | ICD-10-CM | POA: Diagnosis not present

## 2019-04-15 DIAGNOSIS — R600 Localized edema: Secondary | ICD-10-CM | POA: Diagnosis not present

## 2019-04-15 DIAGNOSIS — S81809A Unspecified open wound, unspecified lower leg, initial encounter: Secondary | ICD-10-CM

## 2019-04-15 DIAGNOSIS — K22719 Barrett's esophagus with dysplasia, unspecified: Secondary | ICD-10-CM

## 2019-04-15 DIAGNOSIS — D509 Iron deficiency anemia, unspecified: Secondary | ICD-10-CM

## 2019-04-15 DIAGNOSIS — R739 Hyperglycemia, unspecified: Secondary | ICD-10-CM

## 2019-04-15 DIAGNOSIS — G8929 Other chronic pain: Secondary | ICD-10-CM

## 2019-04-15 DIAGNOSIS — I739 Peripheral vascular disease, unspecified: Secondary | ICD-10-CM

## 2019-04-15 DIAGNOSIS — R062 Wheezing: Secondary | ICD-10-CM

## 2019-04-15 LAB — BASIC METABOLIC PANEL
BUN: 16 mg/dL (ref 6–23)
CO2: 26 mEq/L (ref 19–32)
Calcium: 9.8 mg/dL (ref 8.4–10.5)
Chloride: 103 mEq/L (ref 96–112)
Creatinine, Ser: 0.82 mg/dL (ref 0.40–1.20)
GFR: 65.76 mL/min (ref >60.00)
Glucose, Bld: 88 mg/dL (ref 70–99)
Potassium: 4.4 mEq/L (ref 3.5–5.1)
Sodium: 139 mEq/L (ref 135–145)

## 2019-04-15 LAB — HEPATIC FUNCTION PANEL
ALT: 10 U/L (ref 0–35)
AST: 15 U/L (ref 0–37)
Albumin: 4.1 g/dL (ref 3.5–5.2)
Alkaline Phosphatase: 55 U/L (ref 39–117)
Bilirubin, Direct: 0.2 mg/dL (ref 0.0–0.3)
Total Bilirubin: 1.1 mg/dL (ref 0.2–1.2)
Total Protein: 6.7 g/dL (ref 6.0–8.3)

## 2019-04-15 LAB — CBC WITH DIFFERENTIAL/PLATELET
Basophils Absolute: 0.1 10*3/uL (ref 0.0–0.1)
Basophils Relative: 1 % (ref 0.0–3.0)
Eosinophils Absolute: 0.5 10*3/uL (ref 0.0–0.7)
Eosinophils Relative: 7.1 % — ABNORMAL HIGH (ref 0.0–5.0)
HCT: 39.1 % (ref 36.0–46.0)
Hemoglobin: 13.1 g/dL (ref 12.0–15.0)
Lymphocytes Relative: 19.7 % (ref 12.0–46.0)
Lymphs Abs: 1.3 10*3/uL (ref 0.7–4.0)
MCHC: 33.6 g/dL (ref 30.0–36.0)
MCV: 95.4 fl (ref 78.0–100.0)
Monocytes Absolute: 0.5 10*3/uL (ref 0.1–1.0)
Monocytes Relative: 8 % (ref 3.0–12.0)
Neutro Abs: 4.4 10*3/uL (ref 1.4–7.7)
Neutrophils Relative %: 64.2 % (ref 43.0–77.0)
Platelets: 261 10*3/uL (ref 150.0–400.0)
RBC: 4.1 Mil/uL (ref 3.87–5.11)
RDW: 13.9 % (ref 11.5–15.5)
WBC: 6.8 10*3/uL (ref 4.0–10.5)

## 2019-04-15 LAB — LIPID PANEL
Cholesterol: 149 mg/dL (ref 0–200)
HDL: 54.5 mg/dL (ref >39.00)
LDL Cholesterol: 62 mg/dL (ref 0–99)
NonHDL: 94.61
Total CHOL/HDL Ratio: 3
Triglycerides: 164 mg/dL — ABNORMAL HIGH (ref 0.0–149.0)
VLDL: 32.8 mg/dL (ref 0.0–40.0)

## 2019-04-15 LAB — HEMOGLOBIN A1C: Hgb A1c MFr Bld: 5.5 % (ref 4.6–6.5)

## 2019-04-15 LAB — FERRITIN: Ferritin: 256.1 ng/mL (ref 10.0–291.0)

## 2019-04-15 MED ORDER — ALBUTEROL SULFATE HFA 108 (90 BASE) MCG/ACT IN AERS: 2.0000 | INHALATION_SPRAY | Freq: Four times a day (QID) | RESPIRATORY_TRACT | 1 refills | Status: DC | PRN

## 2019-04-15 NOTE — Progress Notes (Signed)
Patient ID: [name], female   DOB: [birth year], 86 y.o.   MRN: [id number]   Subjective:    Patient ID: [name], female    DOB: [birth year], 86 y.o.   MRN: [id number]  HPI This visit occurred during the SARS-CoV-2 public health emergency.  Safety protocols were in place, including screening questions prior to the visit, additional usage of staff PPE, and extensive cleaning of exam room while observing appropriate contact time as indicated for disinfecting solutions.  Patient here as a work in appointment.  She is accompanied by her husband.  History obtained from both of them.  Was coming back from MD appt.  Coming into the house.  Raining.  Going up stairs.  Walker fell and hit the back of her leg.  Increased bleeding.  Called EMS.  Open wound.  Husband has been changing dressings.  No increased erythema.  Some swelling, but legs overall look better today.  No chest pain.  Has noticed some intermittent wheezing.  Husband reports noticing when she gets anxious or when hurrying.  Recent cxr ok.  Feels breathing overall stable.  Eating well.  No nausea or vomiting.  No acid reflux.  No abdominal pain.  Bowels moving.     Past Medical History:  Diagnosis Date  . Anemia   . Barrett's esophagus   . Bowel obstruction (HCC)    s/p adhesion resection  . Chronic back pain   . Colon cancer ([place]) 1988 and 1989   adenocarcinoma, s/p resection x  2 and chemo  . Diverticulitis   . GERD (gastroesophageal reflux disease)   . H/O open leg wound   . Hiatal hernia   . Hypercholesteremia   . Hypertension   . Lumbar scoliosis   . OA (osteoarthritis)   . Peripheral neuropathy   . Recurrent sinus infections   . Renal cyst   . S/P chemotherapy, time since greater than 12 weeks    colon cancer  . Vertigo    Past Surgical History:  Procedure Laterality Date  . ABDOMINAL HYSTERECTOMY  1982  . adhesions resected     bowel obstruction  . APPENDECTOMY    . BREAST BIOPSY Left    negative 06/14/1985   . CHOLECYSTECTOMY  1994  . COLON RESECTION     x2.  s/p colon cancer  . [place]  . EXCISIONAL HEMORRHOIDECTOMY     with tubal ligation  . EXPLORATORY LAPAROTOMY  1991   Secondary to SBO   Family History  Problem Relation Age of Onset  . Breast cancer Mother   . Asthma Mother   . Stroke Father   . Hypertension Father   . Diabetes Father   . Ovarian cancer Sister        x2  . Prostate cancer Brother   . Hypertension Brother        x3  . Heart disease Brother   . Hypercholesterolemia Brother        x3  . Diabetes Brother   . Spina bifida Grandchild   . Hematuria Neg Hx   . Kidney cancer Neg Hx   . Kidney disease Neg Hx   . Sickle cell trait Neg Hx   . Tuberculosis Neg Hx    Social History   Socioeconomic History  . Marital status: Married    Spouse name: Not on file  . Number of children: 4  . Years of education: 12th grade  .  Highest education level: Not on file  Occupational History  . Occupation: homemaker  Tobacco Use  . Smoking status: Never Smoker  . Smokeless tobacco: Never Used  Substance and Sexual Activity  . Alcohol use: No    Alcohol/week: 0.0 standard drinks  . Drug use: No  . Sexual activity: Never  Other Topics Concern  . Not on file  Social History Narrative  . Not on file   Social Determinants of Health   Financial Resource Strain:   . Difficulty of Paying Living Expenses: Not on file  Food Insecurity:   . Worried About Charity fundraiser in the Last Year: Not on file  . Ran Out of Food in the Last Year: Not on file  Transportation Needs:   . Lack of Transportation (Medical): Not on file  . Lack of Transportation (Non-Medical): Not on file  Physical Activity:   . Days of Exercise per Week: Not on file  . Minutes of Exercise per Session: Not on file  Stress:   . Feeling of Stress : Not on file  Social Connections:   . Frequency of Communication with Friends and Family: Not on file  .  Frequency of Social Gatherings with Friends and Family: Not on file  . Attends Religious Services: Not on file  . Active Member of Clubs or Organizations: Not on file  . Attends Archivist Meetings: Not on file  . Marital Status: Not on file    Outpatient Encounter Medications as of 04/15/2019  Medication Sig  . amLODipine (NORVASC) 10 MG tablet   . fexofenadine (ALLEGRA) 60 MG tablet TAKE 1 TABLET EVERY DAY  . gabapentin (NEURONTIN) 100 MG capsule Take two capsules q hs  . irbesartan (AVAPRO) 150 MG tablet Take 1 tablet (150 mg total) by mouth daily.  . meloxicam (MOBIC) 7.5 MG tablet   . metoprolol succinate (TOPROL-XL) 25 MG 24 hr tablet TAKE 1 TABLET EVERY DAY  . ondansetron (ZOFRAN) 4 MG tablet Take 1 tablet (4 mg total) by mouth 2 (two) times daily as needed for nausea or vomiting.  Marland Kitchen oxyCODONE (OXY IR/ROXICODONE) 5 MG immediate release tablet Take 5 mg by mouth 3 (three) times daily.   . pantoprazole (PROTONIX) 40 MG tablet Take 40 mg by mouth 2 (two) times daily.  . simvastatin (ZOCOR) 10 MG tablet Take 1 tablet (10 mg total) by mouth at bedtime.  . solifenacin (VESICARE) 5 MG tablet Take 1 tablet (5 mg total) by mouth daily.  . traMADol-acetaminophen (ULTRACET) 37.5-325 MG per tablet Take 1 tablet by mouth 2 (two) times daily as needed.   Marland Kitchen albuterol (VENTOLIN HFA) 108 (90 Base) MCG/ACT inhaler Inhale 2 puffs into the lungs every 6 (six) hours as needed for wheezing or shortness of breath.   No facility-administered encounter medications on file as of 04/15/2019.    Review of Systems  Constitutional: Negative for appetite change and unexpected weight change.  HENT: Negative for congestion and sinus pressure.   Respiratory: Negative for cough and chest tightness.        Breathing stable.  Some wheezing as outlined.    Cardiovascular: Negative for chest pain and palpitations.       Leg swelling - better today.    Gastrointestinal: Negative for abdominal pain, diarrhea,  nausea and vomiting.  Genitourinary: Negative for difficulty urinating and dysuria.  Musculoskeletal: Negative for joint swelling and myalgias.  Skin: Negative for color change and rash.       Open wound -  lower leg.    Neurological: Negative for dizziness, light-headedness and headaches.  Psychiatric/Behavioral: Negative for agitation and dysphoric mood.       Objective:    Physical Exam Constitutional:      General: She is not in acute distress.    Appearance: Normal appearance.  HENT:     Head: Normocephalic and atraumatic.     Right Ear: External ear normal.     Left Ear: External ear normal.  Eyes:     General: No scleral icterus.       Right eye: No discharge.        Left eye: No discharge.     Conjunctiva/sclera: Conjunctivae normal.  Neck:     Thyroid: No thyromegaly.  Cardiovascular:     Rate and Rhythm: Normal rate and regular rhythm.  Pulmonary:     Effort: No respiratory distress.     Breath sounds: Normal breath sounds. No wheezing.  Abdominal:     General: Bowel sounds are normal.     Palpations: Abdomen is soft.     Tenderness: There is no abdominal tenderness.  Musculoskeletal:        General: No tenderness.     Cervical back: Neck supple. No tenderness.     Comments: Minimal lower extremity swelling.  No increased erythema.  Open wound - lower leg - calf.  No surrounding erythema.    Lymphadenopathy:     Cervical: No cervical adenopathy.  Neurological:     Mental Status: She is alert.  Psychiatric:        Mood and Affect: Mood normal.        Behavior: Behavior normal.     BP 126/70   Pulse 61   Temp 97.8 F (36.6 C)   Resp 16   Wt 146 lb 12.8 oz (66.6 kg)   SpO2 97%   BMI 26.85 kg/m  Wt Readings from Last 3 Encounters:  04/15/19 146 lb 12.8 oz (66.6 kg)  02/28/19 145 lb (65.8 kg)  02/25/19 145 lb (65.8 kg)     Lab Results  Component Value Date   WBC 6.8 04/15/2019   HGB 13.1 04/15/2019   HCT 39.1 04/15/2019   PLT 261.0 04/15/2019    GLUCOSE 88 04/15/2019   CHOL 149 04/15/2019   TRIG 164.0 (H) 04/15/2019   HDL 54.50 04/15/2019   LDLCALC 62 04/15/2019   ALT 10 04/15/2019   AST 15 04/15/2019   NA 139 04/15/2019   K 4.4 04/15/2019   CL 103 04/15/2019   CREATININE 0.82 04/15/2019   BUN 16 04/15/2019   CO2 26 04/15/2019   TSH 2.36 12/03/2018   INR 0.91 06/11/2015   HGBA1C 5.5 04/15/2019    DG Chest 2 View  Result Date: 12/24/2018 CLINICAL DATA:  Hypertension and lower extremity swelling EXAM: CHEST - 2 VIEW COMPARISON:  01/13/2015 FINDINGS: No focal opacity or pleural effusion. Heart size upper normal. Moderate hiatal hernia. No pneumothorax. IMPRESSION: No active cardiopulmonary disease.  Moderate hiatal hernia Electronically Signed   By: Donavan Foil M.D.   On: 12/24/2018 16:39       Assessment & Plan:   Problem List Items Addressed This Visit    Anemia, iron deficiency    Follow cbc.  Check iron stores.        Barrett's esophagus    Has been followed by GI.  No upper symptoms.  Acid reflux controlled.        Bilateral lower extremity edema    Swelling  today better.  Continue leg elevation.       Chronic back pain    Followed by Dr Sharlet Salina.  S/p ESI.  Stable.        Hereditary hemochromatosis ([place])    Was evaluated by hematology.  Declined further follow up.  Check cbc and iron studies.        Hypercholesteremia    On simvastatin.  Low cholesterol diet and exercise.  Follow lipid panel and liver function tests.        Hypertension    Taking avapro 150mg  q day, metoprolol and amlodipine.  Blood pressures trending down.  Doing better.  Continue current medications.  Follow pressures.  Follow metabolic panel.       Open leg wound    Leg wound as outlined.  No increased erythema.  Refer to wound clinic.        Relevant Orders   Ambulatory referral to Wound Clinic   PAD (peripheral artery disease) ([place])    Previously evaluated by vascular surgery.  Continue risk factor modification.          Wheezing    Reports intermittent wheezing.  Breathing over stable.  Persistent.  Acid reflux controlled.  Recent cxr - ok.  Albuterol inhaler.  Given persistence, will refer to pulmonary for evaluation.        Relevant Orders   Ambulatory referral to Pulmonology    Other Visit Diagnoses    Hyperglycemia           [name], MD

## 2019-04-16 ENCOUNTER — Encounter: Payer: Self-pay | Admitting: Internal Medicine

## 2019-04-16 DIAGNOSIS — R062 Wheezing: Secondary | ICD-10-CM | POA: Insufficient documentation

## 2019-04-16 NOTE — Assessment & Plan Note (Signed)
Leg wound as outlined.  No increased erythema.  Refer to wound clinic.

## 2019-04-16 NOTE — Assessment & Plan Note (Signed)
Has been followed by GI.  No upper symptoms.  Acid reflux controlled.

## 2019-04-16 NOTE — Assessment & Plan Note (Signed)
Follow cbc.  Check iron stores.

## 2019-04-16 NOTE — Assessment & Plan Note (Signed)
Was evaluated by hematology.  Declined further follow up.  Check cbc and iron studies.

## 2019-04-16 NOTE — Assessment & Plan Note (Signed)
Swelling today better.  Continue leg elevation.

## 2019-04-16 NOTE — Assessment & Plan Note (Signed)
Taking avapro 150mg  q day, metoprolol and amlodipine.  Blood pressures trending down.  Doing better.  Continue current medications.  Follow pressures.  Follow metabolic panel.

## 2019-04-16 NOTE — Assessment & Plan Note (Signed)
On simvastatin.  Low cholesterol diet and exercise.  Follow lipid panel and liver function tests.   

## 2019-04-16 NOTE — Assessment & Plan Note (Signed)
Followed by Dr Sharlet Salina.  S/p ESI.  Stable.

## 2019-04-16 NOTE — Assessment & Plan Note (Signed)
Previously evaluated by vascular surgery.  Continue risk factor modification.

## 2019-04-16 NOTE — Assessment & Plan Note (Signed)
Reports intermittent wheezing.  Breathing over stable.  Persistent.  Acid reflux controlled.  Recent cxr - ok.  Albuterol inhaler.  Given persistence, will refer to pulmonary for evaluation.

## 2019-04-22 ENCOUNTER — Other Ambulatory Visit: Payer: Self-pay

## 2019-04-22 ENCOUNTER — Other Ambulatory Visit: Payer: Medicare HMO

## 2019-04-22 ENCOUNTER — Encounter: Payer: Medicare HMO | Attending: Physician Assistant | Admitting: Physician Assistant

## 2019-04-22 DIAGNOSIS — W228XXA Striking against or struck by other objects, initial encounter: Secondary | ICD-10-CM | POA: Diagnosis not present

## 2019-04-22 DIAGNOSIS — Z85038 Personal history of other malignant neoplasm of large intestine: Secondary | ICD-10-CM | POA: Diagnosis not present

## 2019-04-22 DIAGNOSIS — Z9049 Acquired absence of other specified parts of digestive tract: Secondary | ICD-10-CM | POA: Insufficient documentation

## 2019-04-22 DIAGNOSIS — I10 Essential (primary) hypertension: Secondary | ICD-10-CM | POA: Insufficient documentation

## 2019-04-22 DIAGNOSIS — I890 Lymphedema, not elsewhere classified: Secondary | ICD-10-CM | POA: Insufficient documentation

## 2019-04-22 DIAGNOSIS — G8929 Other chronic pain: Secondary | ICD-10-CM | POA: Insufficient documentation

## 2019-04-22 DIAGNOSIS — S81801A Unspecified open wound, right lower leg, initial encounter: Secondary | ICD-10-CM | POA: Insufficient documentation

## 2019-04-23 ENCOUNTER — Encounter: Payer: Medicare HMO | Admitting: Internal Medicine

## 2019-04-23 NOTE — Progress Notes (Signed)
[name], [name] ([id number]) Visit Report for 04/22/2019 Chief Complaint Document Details Patient Name: [name], [name] Date of Service: 04/22/2019 2:15 PM Medical Record Number: [id number] Patient Account Number: [id number] Date of Birth/Sex: April 09, 1928 (86 y.o. F) Treating RN: Primary Care Provider: [name] Other Clinician: Referring Provider: [name] Treating Provider/Extender: Melburn Hake, Deshana Rominger Weeks in Treatment: 0 Information Obtained from: Patient Chief Complaint Traumatic open wound right LE Electronic Signature(s) Signed: 04/22/2019 2:53:31 PM By: Worthy Keeler PA-C Previous Signature: 04/22/2019 2:35:13 PM Version By: Worthy Keeler PA-C Entered By: Worthy Keeler on 04/22/2019 14:53:31 [name] ([id number]) -------------------------------------------------------------------------------- Debridement Details Patient Name: [name] Date of Service: 04/22/2019 2:15 PM Medical Record Number: [id number] Patient Account Number: [id number] Date of Birth/Sex: 01/29/1929 (86 y.o. F) Treating RN: Army Melia Primary Care Provider: [name] Other Clinician: Referring Provider: [name] Treating Provider/Extender: Melburn Hake, Ayiana Winslett Weeks in Treatment: 0 Debridement Performed for Wound #3 Right,Posterior Lower Leg Assessment: Performed By: Physician STONE III, Marshayla Mitschke E., PA-C Debridement Type: Debridement Level of Consciousness (Pre- Awake and Alert procedure): Pre-procedure Verification/Time Out Yes - 14:58 Taken: Start Time: 14:59 Pain Control: Lidocaine Total Area Debrided (L x W): 2.5 (cm) x 2.7 (cm) = 6.75 (cm) Tissue and other material debrided: Viable, Non-Viable, Slough, Subcutaneous, Slough Level: Skin/Subcutaneous Tissue Debridement Description: Excisional Instrument: Curette Bleeding: Minimum Hemostasis Achieved: Pressure End Time: 15:00 Response to Treatment: Procedure was tolerated well Level of Consciousness (Post- Awake  and Alert procedure): Post Debridement Measurements of Total Wound Length: (cm) 2.5 Width: (cm) 2.7 Depth: (cm) 0.1 Volume: (cm) 0.53 Character of Wound/Ulcer Post Debridement: Stable Post Procedure Diagnosis Same as Pre-procedure Electronic Signature(s) Signed: 04/22/2019 4:58:25 PM By: Army Melia Signed: 04/23/2019 12:55:39 AM By: Worthy Keeler PA-C Entered By: Army Melia on 04/22/2019 15:00:45 [name] ([id number]) -------------------------------------------------------------------------------- HPI Details Patient Name: [name] Date of Service: 04/22/2019 2:15 PM Medical Record Number: [id number] Patient Account Number: [id number] Date of Birth/Sex: [birth year] (86 y.o. F) Treating RN: Primary Care Provider: [name] Other Clinician: Referring Provider: [name] Treating Provider/Extender: Melburn Hake, Lorenna Lurry Weeks in Treatment: 0 History of Present Illness HPI Description: 86 year old patient who had a blunt injury to her right lower extremity about a few weeks ago now noticed that the wound had opened out and started draining fluid. She is known to have chronic lower extremity edema which has been there for several years. The PCP feels that the edema has not related to anemia or CHF. The patient's past medical history significant for adenocarcinoma of the colon status post resection, bowel obstruction with adhesions, essential hypertension, vertigo,chronic back pain, status post cholecystectomy, hysterectomy, appendectomy, colon surgery, bowel adhesion resection, hemorrhoid surgery. She has never been a smoker. 06/18/2015 -- the patient was admitted to hospital between 06/11/2015 and 06/14/2015 with an acute ileitis, hypokalemia and accelerated hypertension. was put on empiric Cipro and Flagyl. She is doing fine at the present time. Readmission: 04/22/2019 patient presents today for readmission here in the clinic though she has not been seen since May  2017. Subsequently she had an injury that occurred 2 weeks ago when she had a traumatic wound when she hit her leg in the posterior aspect on the right. Patient notes that the injury occurred when after coming inside her walker actually fell over and struck her in the back of the leg. Since that time she has been having issues with some discomfort and drainage they had some leftover Santyl. EMS was called out  to the house they put a dressing on it but did not pull the skin back down apparently. Unfortunately this means that she really has not healed as well as potentially she otherwise could have. There is no signs of active infection at this time. No fevers, chills, nausea, vomiting, or diarrhea. The patient does have a history of hypertension as well as lymphedema. Her ABIs were noncompressible at this point. No fevers, chills, nausea, vomiting, or diarrhea. Electronic Signature(s) Signed: 04/22/2019 3:08:57 PM By: Worthy Keeler PA-C Previous Signature: 04/22/2019 2:55:19 PM Version By: Worthy Keeler PA-C Entered By: Worthy Keeler on 04/22/2019 15:08:57 [name] ([id number]) -------------------------------------------------------------------------------- Physical Exam Details Patient Name: [name] Date of Service: 04/22/2019 2:15 PM Medical Record Number: [id number] Patient Account Number: [id number] Date of Birth/Sex: September 22, 1928 (86 y.o. F) Treating RN: Primary Care Provider: [name] Other Clinician: Referring Provider: [name] Treating Provider/Extender: STONE III, Krist Rosenboom Weeks in Treatment: 0 Constitutional patient is hypertensive.. pulse regular and within target range for patient.Marland Kitchen respirations regular, non-labored and within target range for patient.Marland Kitchen temperature within target range for patient.. Well-nourished and well-hydrated in no acute distress. Eyes conjunctiva clear no eyelid edema noted. pupils equal round and reactive to light and  accommodation. Ears, Nose, Mouth, and Throat no gross abnormality of ear auricles or external auditory canals. normal hearing noted during conversation. mucus membranes moist. Respiratory normal breathing without difficulty. Cardiovascular 2+ dorsalis pedis/posterior tibialis pulses. Unfortunately her ABIs are noncompressible.. Patient has bilateral stage I lymphedema. Musculoskeletal Patient unable to walk without assistance. no significant deformity or arthritic changes, no loss or range of motion, no clubbing. Psychiatric this patient is able to make decisions and demonstrates good insight into disease process. Alert and Oriented x 3. pleasant and cooperative. Notes Upon inspection patient's wound actually showed some signs of slough noted on the surface of the wound I do believe that the Santyl used over the past 2 weeks has done very well would help clean this out there is some necrotic tissue noted along with slough and biofilm that we may need to remove away however I do believe collagen will be a good option following that point. Short debridement was performed without complication and post debridement wound bed appears to be doing excellent. Electronic Signature(s) Signed: 04/22/2019 3:06:21 PM By: Worthy Keeler PA-C Entered By: Worthy Keeler on 04/22/2019 15:06:20 [name] ([id number]) -------------------------------------------------------------------------------- Physician Orders Details Patient Name: [name] Date of Service: 04/22/2019 2:15 PM Medical Record Number: [id number] Patient Account Number: [id number] Date of Birth/Sex: 1928/03/06 (86 y.o. F) Treating RN: Army Melia Primary Care Provider: [name] Other Clinician: Referring Provider: [name] Treating Provider/Extender: Melburn Hake, Lashawne Dura Weeks in Treatment: 0 Verbal / Phone Orders: No Diagnosis Coding ICD-10 Coding Code Description I89.0 Lymphedema, not elsewhere  classified S81.801A Unspecified open wound, right lower leg, initial encounter I10 Essential (primary) hypertension Wound Cleansing Wound #3 Right,Posterior Lower Leg o Clean wound with Normal Saline. - in office o Cleanse wound with mild soap and water Primary Wound Dressing Wound #3 Right,Posterior Lower Leg o Silver Collagen Secondary Dressing Wound #3 Right,Posterior Lower Leg o Boardered Foam Dressing Dressing Change Frequency Wound #3 Right,Posterior Lower Leg o Change dressing every other day. Follow-up Appointments Wound #3 Right,Posterior Lower Leg o Return Appointment in 1 week. Electronic Signature(s) Signed: 04/22/2019 4:58:25 PM By: Army Melia Signed: 04/23/2019 12:55:39 AM By: Worthy Keeler PA-C Entered By: Army Melia on 04/22/2019 15:02:25 [name] ([id number]) --------------------------------------------------------------------------------  Problem List Details Patient Name: [name], [name] Date of Service: 04/22/2019 2:15 PM Medical Record Number: [id number] Patient Account Number: [id number] Date of Birth/Sex: 02-14-1929 (86 y.o. F) Treating RN: Primary Care Provider: [name] Other Clinician: Referring Provider: [name] Treating Provider/Extender: Melburn Hake, Lavergne Hiltunen Weeks in Treatment: 0 Active Problems ICD-10 Evaluated Encounter Code Description Active Date Today Diagnosis I89.0 Lymphedema, not elsewhere classified 04/22/2019 No Yes S81.801A Unspecified open wound, right lower leg, initial encounter 04/22/2019 No Yes I10 Essential (primary) hypertension 04/22/2019 No Yes Inactive Problems Resolved Problems Electronic Signature(s) Signed: 04/22/2019 2:52:42 PM By: Worthy Keeler PA-C Previous Signature: 04/22/2019 2:35:06 PM Version By: Worthy Keeler PA-C Entered By: Worthy Keeler on 04/22/2019 14:52:42 [name] ([id number]) -------------------------------------------------------------------------------- Progress Note  Details Patient Name: [name] Date of Service: 04/22/2019 2:15 PM Medical Record Number: [id number] Patient Account Number: [id number] Date of Birth/Sex: [birth year] (86 y.o. F) Treating RN: Primary Care Provider: [name] Other Clinician: Referring Provider: [name] Treating Provider/Extender: Melburn Hake, Chayse Gracey Weeks in Treatment: 0 Subjective Chief Complaint Information obtained from Patient Traumatic open wound right LE History of Present Illness (HPI) 86 year old patient who had a blunt injury to her right lower extremity about a few weeks ago now noticed that the wound had opened out and started draining fluid. She is known to have chronic lower extremity edema which has been there for several years. The PCP feels that the edema has not related to anemia or CHF. The patient's past medical history significant for adenocarcinoma of the colon status post resection, bowel obstruction with adhesions, essential hypertension, vertigo,chronic back pain, status post cholecystectomy, hysterectomy, appendectomy, colon surgery, bowel adhesion resection, hemorrhoid surgery. She has never been a smoker. 06/18/2015 -- the patient was admitted to hospital between 06/11/2015 and 06/14/2015 with an acute ileitis, hypokalemia and accelerated hypertension. was put on empiric Cipro and Flagyl. She is doing fine at the present time. Readmission: 04/22/2019 patient presents today for readmission here in the clinic though she has not been seen since May 2017. Subsequently she had an injury that occurred 2 weeks ago when she had a traumatic wound when she hit her leg in the posterior aspect on the right. Patient notes that the injury occurred when after coming inside her walker actually fell over and struck her in the back of the leg. Since that time she has been having issues with some discomfort and drainage they had some leftover Santyl. EMS was called out to the house they put a dressing on  it but did not pull the skin back down apparently. Unfortunately this means that she really has not healed as well as potentially she otherwise could have. There is no signs of active infection at this time. No fevers, chills, nausea, vomiting, or diarrhea. The patient does have a history of hypertension as well as lymphedema. Her ABIs were noncompressible at this point. No fevers, chills, nausea, vomiting, or diarrhea. Patient History Information obtained from Patient. Allergies No Known Drug Allergies Family History Cancer - Child, Diabetes - Siblings,Father, Stroke - Father, Thyroid Problems - Siblings, No family history of Heart Disease, Hereditary Spherocytosis, Hypertension, Kidney Disease, Lung Disease, Seizures, Tuberculosis. Social History Never smoker, Marital Status - Married, Alcohol Use - Never, Drug Use - No History, Caffeine Use - Daily. Medical History Hematologic/Lymphatic Patient has history of Anemia - hx Cardiovascular Patient has history of Hypertension Musculoskeletal Patient has history of Osteoarthritis Oncologic Patient has history of Received Chemotherapy - 1989 [facility]  History Notes Ear/Nose/Mouth/Throat vertigo Hematologic/Lymphatic hemachromatosis Gastrointestinal GERD [name], [name] ([id number]) Oncologic colon cancer Objective Constitutional patient is hypertensive.. pulse regular and within target range for patient.Marland Kitchen respirations regular, non-labored and within target range for patient.Marland Kitchen temperature within target range for patient.. Well-nourished and well-hydrated in no acute distress. Vitals Time Taken: 2:42 PM, Height: 62 in, Weight: 145 lbs, BMI: 26.5, Temperature: 98.9 F, Pulse: 79 bpm, Respiratory Rate: 16 breaths/min, Blood Pressure: 168/64 mmHg. Eyes conjunctiva clear no eyelid edema noted. pupils equal round and reactive to light and accommodation. Ears, Nose, Mouth, and Throat no gross abnormality of ear auricles or  external auditory canals. normal hearing noted during conversation. mucus membranes moist. Respiratory normal breathing without difficulty. Cardiovascular 2+ dorsalis pedis/posterior tibialis pulses. Unfortunately her ABIs are noncompressible.. Patient has bilateral stage I lymphedema. Musculoskeletal Patient unable to walk without assistance. no significant deformity or arthritic changes, no loss or range of motion, no clubbing. Psychiatric this patient is able to make decisions and demonstrates good insight into disease process. Alert and Oriented x 3. pleasant and cooperative. General Notes: Upon inspection patient's wound actually showed some signs of slough noted on the surface of the wound I do believe that the Santyl used over the past 2 weeks has done very well would help clean this out there is some necrotic tissue noted along with slough and biofilm that we may need to remove away however I do believe collagen will be a good option following that point. Short debridement was performed without complication and post debridement wound bed appears to be doing excellent. Integumentary (Hair, Skin) Wound #3 status is Open. Original cause of wound was Trauma. The wound is located on the Right,Posterior Lower Leg. The wound measures 2.5cm length x 2.7cm width x 0.1cm depth; 5.301cm^2 area and 0.53cm^3 volume. There is no tunneling or undermining noted. There is a medium amount of serous drainage noted. The wound margin is flat and intact. There is no granulation within the wound bed. There is a large (67-100%) amount of necrotic tissue within the wound bed including Adherent Slough. Assessment Active Problems ICD-10 Lymphedema, not elsewhere classified Unspecified open wound, right lower leg, initial encounter Essential (primary) hypertension Procedures [name], [name] ([id number]) Wound #3 Pre-procedure diagnosis of Wound #3 is a Trauma, Other located on the Right,Posterior Lower Leg .  There was a Excisional Skin/Subcutaneous Tissue Debridement with a total area of 6.75 sq cm performed by STONE III, Redell Bhandari E., PA-C. With the following instrument(s): Curette to remove Viable and Non-Viable tissue/material. Material removed includes Subcutaneous Tissue and Slough and after achieving pain control using Lidocaine. A time out was conducted at 14:58, prior to the start of the procedure. A Minimum amount of bleeding was controlled with Pressure. The procedure was tolerated well. Post Debridement Measurements: 2.5cm length x 2.7cm width x 0.1cm depth; 0.53cm^3 volume. Character of Wound/Ulcer Post Debridement is stable. Post procedure Diagnosis Wound #3: Same as Pre-Procedure Plan Wound Cleansing: Wound #3 Right,Posterior Lower Leg: Clean wound with Normal Saline. - in office Cleanse wound with mild soap and water Primary Wound Dressing: Wound #3 Right,Posterior Lower Leg: Silver Collagen Secondary Dressing: Wound #3 Right,Posterior Lower Leg: Boardered Foam Dressing Dressing Change Frequency: Wound #3 Right,Posterior Lower Leg: Change dressing every other day. Follow-up Appointments: Wound #3 Right,Posterior Lower Leg: Return Appointment in 1 week. 1. Silver collagen dressing which the patient and her husband are in agreement with the plan. 2. Based on what I am seeing at this time I would recommend that  we go ahead and initiate treatment with a border foam dressing to cover I think this will protect the area. 3. With regard to her arterial studies that is something we discussed today that she seems to have fairly good blood flow in my opinion good pulses and good blood flow around the wound itself. For that reason we will hold off on the arterial studies if things do not improve like I am expecting then we will always order that and go from there. 4. I do not see any evidence of infection at this point. We will see patient back for reevaluation in 1 week here in the clinic.  If anything worsens or changes patient will contact our office for additional recommendations. Electronic Signature(s) Signed: 04/22/2019 3:09:11 PM By: Worthy Keeler PA-C Entered By: Worthy Keeler on 04/22/2019 15:09:10 [name] ([id number]) -------------------------------------------------------------------------------- ROS/PFSH Details Patient Name: [name] Date of Service: 04/22/2019 2:15 PM Medical Record Number: [id number] Patient Account Number: [id number] Date of Birth/Sex: [birth year] (86 y.o. F) Treating RN: Cornell Barman Primary Care Provider: [name] Other Clinician: Referring Provider: [name] Treating Provider/Extender: Melburn Hake, Kirkland Figg Weeks in Treatment: 0 Information Obtained From Patient Ear/Nose/Mouth/Throat Medical History: Past Medical History Notes: vertigo Hematologic/Lymphatic Medical History: Positive for: Anemia - hx Past Medical History Notes: hemachromatosis Cardiovascular Medical History: Positive for: Hypertension Gastrointestinal Medical History: Past Medical History Notes: GERD Musculoskeletal Medical History: Positive for: Osteoarthritis Oncologic Medical History: Positive for: Received Chemotherapy - 1989 Past Medical History Notes: colon cancer Immunizations Pneumococcal Vaccine: Received Pneumococcal Vaccination: Yes Implantable Devices None Family and Social History Cancer: Yes - Child; Diabetes: Yes - Siblings,Father; Heart Disease: No; Hereditary Spherocytosis: No; Hypertension: No; Kidney Disease: No; Lung Disease: No; Seizures: No; Stroke: Yes - Father; Thyroid Problems: Yes - Siblings; Tuberculosis: No; Never smoker; Marital Status - Married; Alcohol Use: Never; Drug Use: No History; Caffeine Use: Daily; Financial Concerns: No; Food, Clothing or Shelter Needs: No; Support System Lacking: No; Transportation Concerns: No Engineer, maintenance) Signed: 04/22/2019 5:15:52 PM By: Gretta Cool, BSN, RN, CWS, Kim  RN, BSN Signed: 04/23/2019 12:55:39 AM By: Worthy Keeler PA-C Entered By: Gretta Cool BSN, RN, CWS, Kim on 04/22/2019 14:31:09 [name], [name] ([id number]) [name], [name] ([id number]) -------------------------------------------------------------------------------- [place] Details Patient Name: [name] Date of Service: 04/22/2019 Medical Record Number: [id number] Patient Account Number: [id number] Date of Birth/Sex: [birth year] (86 y.o. F) Treating RN: Primary Care Provider: [name] Other Clinician: Referring Provider: [name] Treating Provider/Extender: Melburn Hake, Shant Hence Weeks in Treatment: 0 Diagnosis Coding ICD-10 Codes Code Description I89.0 Lymphedema, not elsewhere classified S81.801A Unspecified open wound, right lower leg, initial encounter [place] (primary) hypertension Facility Procedures CPT4 Code: [id number] Description: WOUND CARE VISIT-LEV 3 NEW PT Modifier: Quantity: 1 CPT4 Code: [id number] Description: [id number] - DEB SUBQ TISSUE 20 SQ CM/< Modifier: Quantity: 1 CPT4 Code: Description: ICD-10 Diagnosis Description [id number] Unspecified open wound, right lower leg, initial encounter Modifier: Quantity: Physician Procedures CPT4 [id number] Description: WC PHYS LEVEL 3 o NEW PT Modifier: 25 Quantity: 1 CPT4 Code: Description: ICD-10 Diagnosis Description I89.0 Lymphedema, not elsewhere classified S81.801A Unspecified open wound, right lower leg, initial encounter I10 Essential (primary) hypertension Modifier: Quantity: CPT4 [id number] Description: [id number] - WC PHYS SUBQ TISS 20 SQ CM Modifier: Quantity: 1 CPT4 Code: Description: ICD-10 Diagnosis Description S81.801A Unspecified open wound, right lower leg, initial encounter Modifier: Quantity: Electronic Signature(s) Signed: 04/22/2019 3:09:44 PM By: Worthy Keeler PA-C Entered By: Worthy Keeler on 04/22/2019 15:09:43

## 2019-04-23 NOTE — Progress Notes (Signed)
[name], [name] ([id number]) Visit Report for 04/22/2019 Allergy List Details Patient Name: [name], [name] Date of Service: 04/22/2019 2:15 PM Medical Record Number: [id number] Patient Account Number: [id number] Date of Birth/Sex: 1928-03-01 (86 y.o. F) Treating RN: Cornell Barman Primary Care Aser Nylund: [name] Other Clinician: Referring Kitiara Hintze: [name] Treating Livia Tarr/Extender: Melburn Hake, HOYT Weeks in Treatment: 0 Allergies Active Allergies No Known Drug Allergies Allergy Notes Electronic Signature(s) Signed: 04/22/2019 5:15:52 PM By: Gretta Cool, BSN, RN, CWS, Kim RN, BSN Entered By: Gretta Cool, BSN, RN, CWS, Kim on 04/22/2019 14:29:56 [name] ([id number]) -------------------------------------------------------------------------------- Arrival Information Details Patient Name: [name] Date of Service: 04/22/2019 2:15 PM Medical Record Number: [id number] Patient Account Number: [id number] Date of Birth/Sex: 11-Jul-1928 (86 y.o. F) Treating RN: Cornell Barman Primary Care Alita Waldren: [name] Other Clinician: Referring Destiny Hagin: [name] Treating Josanna Hefel/Extender: Melburn Hake, HOYT Weeks in Treatment: 0 Visit Information Patient Arrived: Walker Arrival Time: 14:21 Accompanied By: husband Transfer Assistance: None Patient Identification Verified: Yes Secondary Verification Process Completed: Yes Patient Requires Transmission-Based Precautions: No Patient Has Alerts: No History Since Last Visit Electronic Signature(s) Signed: 04/22/2019 5:15:52 PM By: Gretta Cool, BSN, RN, CWS, Kim RN, BSN Entered By: Gretta Cool, BSN, RN, CWS, Kim on 04/22/2019 14:23:53 [name] ([id number]) -------------------------------------------------------------------------------- Clinic Level of Care Assessment Details Patient Name: [name] Date of Service: 04/22/2019 2:15 PM Medical Record Number: [id number] Patient Account Number: [id number] Date of Birth/Sex: [birth year] (86  y.o. F) Treating RN: Army Melia Primary Care Astra Gregg: [name] Other Clinician: Referring Yarimar Lavis: [name] Treating Zayaan Kozak/Extender: Melburn Hake, HOYT Weeks in Treatment: 0 Clinic Level of Care Assessment Items TOOL 1 Quantity Score []  - Use when EandM and Procedure is performed on INITIAL visit 0 ASSESSMENTS - Nursing Assessment / Reassessment X - General Physical Exam (combine w/ comprehensive assessment (listed just below) when performed on new pt. 1 20 evals) X- 1 25 Comprehensive Assessment (HX, ROS, Risk Assessments, Wounds Hx, etc.) ASSESSMENTS - Wound and Skin Assessment / Reassessment []  - Dermatologic / Skin Assessment (not related to wound area) 0 ASSESSMENTS - Ostomy and/or Continence Assessment and Care []  - Incontinence Assessment and Management 0 []  - 0 Ostomy Care Assessment and Management (repouching, etc.) PROCESS - Coordination of Care X - Simple Patient / Family Education for ongoing care 1 15 []  - 0 Complex (extensive) Patient / Family Education for ongoing care X- 1 10 Staff obtains Programmer, systems, Records, Test Results / Process Orders []  - 0 Staff telephones HHA, Nursing Homes / Clarify orders / etc []  - 0 Routine Transfer to another Facility (non-emergent condition) []  - 0 Routine Hospital Admission (non-emergent condition) X- 1 15 New Admissions / Biomedical engineer / Ordering NPWT, Apligraf, etc. []  - 0 Emergency Hospital Admission (emergent condition) PROCESS - Special Needs []  - Pediatric / Minor Patient Management 0 []  - 0 Isolation Patient Management []  - 0 Hearing / Language / Visual special needs []  - 0 Assessment of Community assistance (transportation, D/C planning, etc.) []  - 0 Additional assistance / Altered mentation []  - 0 Support Surface(s) Assessment (bed, cushion, seat, etc.) INTERVENTIONS - Miscellaneous []  - External ear exam 0 []  - 0 Patient Transfer (multiple staff / Civil Service fast streamer / Similar devices) []   - 0 Simple Staple / Suture removal (25 or less) []  - 0 Complex Staple / Suture removal (26 or more) []  - 0 Hypo/Hyperglycemic Management (do not check if billed separately) Lovern, Tabor N. ([id number]) []  - 0 Ankle / Brachial Index (ABI) - do  not check if billed separately Has the patient been seen at the hospital within the last three years: Yes Total Score: 85 Level Of Care: New/Established - Level 3 Electronic Signature(s) Signed: 04/22/2019 4:58:25 PM By: Army Melia Entered By: Army Melia on 04/22/2019 15:02:40 [name] ([id number]) -------------------------------------------------------------------------------- Encounter Discharge Information Details Patient Name: [name] Date of Service: 04/22/2019 2:15 PM Medical Record Number: [id number] Patient Account Number: [id number] Date of Birth/Sex: 07-10-1928 (86 y.o. F) Treating RN: Army Melia Primary Care Walfred Bettendorf: [name] Other Clinician: Referring Kalee Broxton: [name] Treating Areana Kosanke/Extender: Melburn Hake, HOYT Weeks in Treatment: 0 Encounter Discharge Information Items Post Procedure Vitals Discharge Condition: Stable Temperature (F): 98.9 Ambulatory Status: Walker Pulse (bpm): 79 Discharge Destination: Home Respiratory Rate (breaths/min): 16 Transportation: Private Auto Blood Pressure (mmHg): 168/64 Accompanied By: spouse Schedule Follow-up Appointment: Yes Clinical Summary of Care: Electronic Signature(s) Signed: 04/22/2019 4:58:25 PM By: Army Melia Entered By: Army Melia on 04/22/2019 15:03:31 [name] ([id number]) -------------------------------------------------------------------------------- Lower Extremity Assessment Details Patient Name: [name] Date of Service: 04/22/2019 2:15 PM Medical Record Number: [id number] Patient Account Number: [id number] Date of Birth/Sex: 1928-11-19 (86 y.o. F) Treating RN: Cornell Barman Primary Care Garrell Flagg: [name] Other  Clinician: Referring Kortney Potvin: [name] Treating Kayode Petion/Extender: Melburn Hake, HOYT Weeks in Treatment: 0 Edema Assessment Assessed: [Left: No] [Right: Yes] Edema: [Left: Ye] [Right: s] Calf Left: Right: Point of Measurement: 28 cm From Medial Instep cm 30.6 cm Ankle Left: Right: Point of Measurement: 11 cm From Medial Instep cm 23 cm Vascular Assessment Blood Pressure: Ankle: [Right:Dorsalis Pedis: 220] Notes ABI on right Non-compressible DT >220. Electronic Signature(s) Signed: 04/22/2019 5:15:52 PM By: Gretta Cool, BSN, RN, CWS, Kim RN, BSN Entered By: Gretta Cool, BSN, RN, CWS, Kim on 04/22/2019 14:44:21 [name] ([id number]) -------------------------------------------------------------------------------- Multi Wound Chart Details Patient Name: [name] Date of Service: 04/22/2019 2:15 PM Medical Record Number: [id number] Patient Account Number: [id number] Date of Birth/Sex: [birth year] (86 y.o. F) Treating RN: Army Melia Primary Care Lynzi Meulemans: [name] Other Clinician: Referring Alvetta Hidrogo: [name] Treating Tyon Cerasoli/Extender: Melburn Hake, HOYT Weeks in Treatment: 0 Vital Signs Height(in): 62 Pulse(bpm): 79 Weight(lbs): 145 Blood Pressure(mmHg): 168/64 Body Mass Index(BMI): 27 Temperature(F): 98.9 Respiratory Rate(breaths/min): 16 Photos: [N/A:N/A] Wound Location: Right Lower Leg - Posterior N/A N/A Wounding Event: Trauma N/A N/A Primary Etiology: Trauma, Other N/A N/A Comorbid History: Anemia, Hypertension, N/A N/A Osteoarthritis, Received Chemotherapy Date Acquired: 04/08/2019 N/A N/A Weeks of Treatment: 0 N/A N/A Wound Status: Open N/A N/A Measurements L x W x D (cm) 2.5x2.7x0.1 N/A N/A Area (cm) : 5.301 N/A N/A Volume (cm) : 0.53 N/A N/A % Reduction in Area: 0.00% N/A N/A % Reduction in Volume: 0.00% N/A N/A Classification: Full Thickness Without Exposed N/A N/A Support Structures Exudate Amount: Medium N/A N/A Exudate Type: Serous  N/A N/A Exudate Color: amber N/A N/A Wound Margin: Flat and Intact N/A N/A Granulation Amount: None Present (0%) N/A N/A Necrotic Amount: Large (67-100%) N/A N/A Epithelialization: Small (1-33%) N/A N/A Treatment Notes Electronic Signature(s) Signed: 04/22/2019 4:58:25 PM By: Army Melia Entered By: Army Melia on 04/22/2019 14:59:55 [name] ([id number]) -------------------------------------------------------------------------------- [place] Details Patient Name: [name] Date of Service: 04/22/2019 2:15 PM Medical Record Number: [id number] Patient Account Number: [id number] Date of Birth/Sex: 19-Feb-1928 (86 y.o. F) Treating RN: Army Melia Primary Care Assata Juncaj: [name] Other Clinician: Referring China Deitrick: [name] Treating Joee Iovine/Extender: Melburn Hake, HOYT Weeks in Treatment: 0 Active Inactive Abuse / Safety / Falls /  Self Care Management Nursing Diagnoses: History of Falls Goals: Patient/caregiver will verbalize understanding of the importance to maintain current immunizations/vaccinations Date Initiated: 04/22/2019 Target Resolution Date: 05/24/2019 Goal Status: Active Interventions: Provide education on fall prevention Notes: Orientation to the Wound Care Program Nursing Diagnoses: Knowledge deficit related to the wound healing center program Goals: Patient/caregiver will verbalize understanding of the [facility] Date Initiated: 04/22/2019 Target Resolution Date: 05/25/2019 Goal Status: Active Interventions: Provide education on orientation to the wound center Notes: Wound/Skin Impairment Nursing Diagnoses: Impaired tissue integrity Goals: Ulcer/skin breakdown will have a volume reduction of 30% by week 4 Date Initiated: 04/22/2019 Target Resolution Date: 05/24/2019 Goal Status: Active Interventions: Assess ulceration(s) every visit Notes: Electronic Signature(s) Signed: 04/22/2019 4:58:25 PM By:  Army Melia Entered By: Army Melia on 04/22/2019 14:59:35 [name] ([id number]) [name] ([id number]) -------------------------------------------------------------------------------- Pain Assessment Details Patient Name: [name] Date of Service: 04/22/2019 2:15 PM Medical Record Number: [id number] Patient Account Number: [id number] Date of Birth/Sex: 1929-01-17 (86 y.o. F) Treating RN: Cornell Barman Primary Care Markeda Narvaez: [name] Other Clinician: Referring Rajesh Wyss: [name] Treating Blessin Kanno/Extender: Melburn Hake, HOYT Weeks in Treatment: 0 Active Problems Location of Pain Severity and Description of Pain Patient Has Paino No Site Locations Pain Management and Medication Current Pain Management: Notes Patient denies pain at this time. Electronic Signature(s) Signed: 04/22/2019 5:15:52 PM By: Gretta Cool, BSN, RN, CWS, Kim RN, BSN Entered By: Gretta Cool, BSN, RN, CWS, Kim on 04/22/2019 14:24:09 [name] ([id number]) -------------------------------------------------------------------------------- Patient/Caregiver Education Details Patient Name: [name] Date of Service: 04/22/2019 2:15 PM Medical Record Number: [id number] Patient Account Number: [id number] Date of Birth/Gender: 30-Oct-1928 (86 y.o. F) Treating RN: Army Melia Primary Care Physician: [name] Other Clinician: Referring Physician: [name] Treating Physician/Extender: Sharalyn Ink in Treatment: 0 Education Assessment Education Provided To: Patient Education Topics Provided Wound/Skin Impairment: Handouts: Caring for Your Ulcer Methods: Demonstration, Explain/Verbal Responses: State content correctly Electronic Signature(s) Signed: 04/22/2019 4:58:25 PM By: Army Melia Entered By: Army Melia on 04/22/2019 15:02:50 [name] ([id number]) -------------------------------------------------------------------------------- Wound Assessment  Details Patient Name: [name] Date of Service: 04/22/2019 2:15 PM Medical Record Number: [id number] Patient Account Number: [id number] Date of Birth/Sex: May 26, 1928 (86 y.o. F) Treating RN: Cornell Barman Primary Care Vernida Mcnicholas: [name] Other Clinician: Referring Kaylynne Andres: [name] Treating Nahiem Dredge/Extender: Melburn Hake, HOYT Weeks in Treatment: 0 Wound Status Wound Number: 3 Primary Trauma, Other Etiology: Wound Location: Right Lower Leg - Posterior Wound Status: Open Wounding Event: Trauma Comorbid Anemia, Hypertension, Osteoarthritis, Received Date Acquired: 04/08/2019 History: Chemotherapy Weeks Of Treatment: 0 Clustered Wound: No Photos Wound Measurements Length: (cm) 2.5 Width: (cm) 2.7 Depth: (cm) 0.1 Area: (cm) 5.301 Volume: (cm) 0.53 % Reduction in Area: 0% % Reduction in Volume: 0% Epithelialization: Small (1-33%) Tunneling: No Undermining: No Wound Description Classification: Full Thickness Without Exposed Support Structu Wound Margin: Flat and Intact Exudate Amount: Medium Exudate Type: Serous Exudate Color: amber res Foul Odor After Cleansing: No Slough/Fibrino No Wound Bed Granulation Amount: None Present (0%) Necrotic Amount: Large (67-100%) Necrotic Quality: Adherent Slough Treatment Notes Wound #3 (Right, Posterior Lower Leg) Notes prisma, BFD Electronic Signature(s) Signed: 04/22/2019 5:15:52 PM By: Gretta Cool, BSN, RN, CWS, Kim RN, BSN Entered By: Gretta Cool, BSN, RN, CWS, Kim on 04/22/2019 14:39:07 [name] ([id number]) -------------------------------------------------------------------------------- Vitals Details Patient Name: [name] Date of Service: 04/22/2019 2:15 PM Medical Record Number: [id number] Patient Account Number: [id number] Date of Birth/Sex: 08/04/1928 (86 y.o. F) Treating RN:  Cornell Barman Primary Care Jerrian Mells: [name] Other Clinician: Referring Berdella Bacot: [name] Treating Severo Beber/Extender:  Melburn Hake, HOYT Weeks in Treatment: 0 Vital Signs Time Taken: 14:42 Temperature (F): 98.9 Height (in): 62 Pulse (bpm): 79 Weight (lbs): 145 Respiratory Rate (breaths/min): 16 Body Mass Index (BMI): 26.5 Blood Pressure (mmHg): 168/64 Reference Range: 80 - 120 mg / dl Electronic Signature(s) Signed: 04/22/2019 5:15:52 PM By: Gretta Cool, BSN, RN, CWS, Kim RN, BSN Entered By: Gretta Cool, BSN, RN, CWS, Kim on 04/22/2019 14:25:16

## 2019-04-23 NOTE — Progress Notes (Signed)
DAJANAE, MUCCINO ([id number]) Visit Report for 04/22/2019 Abuse/Suicide Risk Screen Details Patient Name: [name], [name] Date of Service: 04/22/2019 2:15 PM Medical Record Number: [id number] Patient Account Number: [id number] Date of Birth/Sex: 1928/12/04 (86 y.o. F) Treating RN: Cornell Barman Primary Care Corday Wyka: [name] Other Clinician: Referring Cindy Fullman: [name] Treating Ilena Dieckman/Extender: Melburn Hake, HOYT Weeks in Treatment: 0 Abuse/Suicide Risk Screen Items Answer ABUSE RISK SCREEN: Has anyone close to you tried to hurt or harm you recentlyo No Do you feel uncomfortable with anyone in your familyo No Has anyone forced you do things that you didnot want to doo No Electronic Signature(s) Signed: 04/22/2019 5:15:52 PM By: Gretta Cool, BSN, RN, CWS, Kim RN, BSN Entered By: Gretta Cool, BSN, RN, CWS, Kim on 04/22/2019 14:31:15 [name] ([id number]) -------------------------------------------------------------------------------- Activities of Daily Living Details Patient Name: [name] Date of Service: 04/22/2019 2:15 PM Medical Record Number: [id number] Patient Account Number: [id number] Date of Birth/Sex: 21-Nov-1928 (86 y.o. F) Treating RN: Cornell Barman Primary Care Shayla Heming: [name] Other Clinician: Referring Jalisia Puchalski: [name] Treating Josefine Fuhr/Extender: Melburn Hake, HOYT Weeks in Treatment: 0 Activities of Daily Living Items Answer Activities of Daily Living (Please select one for each item) Drive Automobile Not Able Take Medications Completely Able Use Telephone Completely Able Care for Appearance Completely Able Use Toilet Completely Able Bath / Shower Completely Able Dress Self Completely Able Feed Self Completely Able Walk Completely Able Get In / Out Bed Completely Able Housework Completely Able Prepare Meals Completely [name] for Self Completely Able Electronic Signature(s) Signed: 04/22/2019 5:15:52 PM By:  Gretta Cool, BSN, RN, CWS, Kim RN, BSN Entered By: Gretta Cool, BSN, RN, CWS, Kim on 04/22/2019 14:31:30 [name] ([id number]) -------------------------------------------------------------------------------- Education Screening Details Patient Name: [name] Date of Service: 04/22/2019 2:15 PM Medical Record Number: [id number] Patient Account Number: [id number] Date of Birth/Sex: 04-07-1928 (86 y.o. F) Treating RN: Cornell Barman Primary Care Chanta Bauers: [name] Other Clinician: Referring Fahad Cisse: [name] Treating Ayah Cozzolino/Extender: Sharalyn Ink in Treatment: 0 Primary Learner Assessed: Patient Learning Preferences/Education Level/Primary Language Learning Preference: Explanation, Demonstration Highest Education Level: High School Preferred Language: English Cognitive Barrier Language Barrier: No Translator Needed: No Memory Deficit: No Emotional Barrier: No Cultural/Religious Beliefs Affecting Medical Care: No Physical Barrier Impaired Vision: No Impaired Hearing: No Decreased Hand dexterity: No Knowledge/Comprehension Knowledge Level: High Comprehension Level: High Ability to understand written instructions: High Ability to understand verbal instructions: High Motivation Anxiety Level: Calm Cooperation: Cooperative Education Importance: Acknowledges Need Interest in Health Problems: Asks Questions Perception: Coherent Willingness to Engage in Self-Management High Activities: Readiness to Engage in Self-Management High Activities: Engineer, maintenance) Signed: 04/22/2019 5:15:52 PM By: Gretta Cool, BSN, RN, CWS, Kim RN, BSN Entered By: Gretta Cool, BSN, RN, CWS, Kim on 04/22/2019 14:31:58 [name] ([id number]) -------------------------------------------------------------------------------- Fall Risk Assessment Details Patient Name: [name] Date of Service: 04/22/2019 2:15 PM Medical Record Number: [id number] Patient Account Number:  [id number] Date of Birth/Sex: [birth year] (86 y.o. F) Treating RN: Cornell Barman Primary Care Briel Gallicchio: [name] Other Clinician: Referring Allina Riches: [name] Treating Somaya Grassi/Extender: Melburn Hake, HOYT Weeks in Treatment: 0 Fall Risk Assessment Items Have you had 2 or more falls in the last 12 monthso 0 No Have you had any fall that resulted in injury in the last 12 monthso 0 Yes FALLS RISK SCREEN History of falling - immediate or within 3 months 0 No Secondary diagnosis (Do you have 2 or more medical diagnoseso) 0 No Ambulatory aid None/bed rest/wheelchair/nurse  0 No Crutches/cane/walker 15 Yes Furniture 0 No Intravenous therapy Access/Saline/Heparin Lock 0 No Gait/Transferring Normal/ bed rest/ wheelchair 0 No Weak (short steps with or without shuffle, stooped but able to lift head while walking, may seek 10 Yes support from furniture) Impaired (short steps with shuffle, may have difficulty arising from chair, head down, impaired 0 No balance) Mental Status Oriented to own ability 0 No Electronic Signature(s) Signed: 04/22/2019 5:15:52 PM By: Gretta Cool, BSN, RN, CWS, Kim RN, BSN Entered By: Gretta Cool, BSN, RN, CWS, Kim on 04/22/2019 14:32:28 [name] ([id number]) -------------------------------------------------------------------------------- Foot Assessment Details Patient Name: [name] Date of Service: 04/22/2019 2:15 PM Medical Record Number: [id number] Patient Account Number: [id number] Date of Birth/Sex: Jul 01, 1928 (86 y.o. F) Treating RN: Cornell Barman Primary Care Pierra Skora: [name] Other Clinician: Referring Xitlalic Maslin: [name] Treating Cotton Beckley/Extender: Melburn Hake, HOYT Weeks in Treatment: 0 Foot Assessment Items Site Locations + = Sensation present, - = Sensation absent, C = Callus, U = Ulcer R = Redness, W = Warmth, M = Maceration, PU = Pre-ulcerative lesion F = Fissure, S = Swelling, D = Dryness Assessment Right: Left: Other  Deformity: No No Prior Foot Ulcer: No No Prior Amputation: No No Charcot Joint: No No Ambulatory Status: Ambulatory With Help Assistance Device: Walker Gait: Administrator, arts) Signed: 04/22/2019 5:15:52 PM By: Gretta Cool, BSN, RN, CWS, Kim RN, BSN Entered By: Gretta Cool, BSN, RN, CWS, Kim on 04/22/2019 14:32:53 [name] ([id number]) -------------------------------------------------------------------------------- Nutrition Risk Screening Details Patient Name: [name] Date of Service: 04/22/2019 2:15 PM Medical Record Number: [id number] Patient Account Number: [id number] Date of Birth/Sex: 1928-09-25 (86 y.o. F) Treating RN: Cornell Barman Primary Care Seger Jani: [name] Other Clinician: Referring Cristin Szatkowski: [name] Treating Ferrel Simington/Extender: Melburn Hake, HOYT Weeks in Treatment: 0 Height (in): 62 Weight (lbs): 145 Body Mass Index (BMI): 26.5 Nutrition Risk Screening Items Score Screening NUTRITION RISK SCREEN: I have an illness or condition that made me change the kind and/or amount of food I eat 0 No I eat fewer than two meals per day 0 No I eat few fruits and vegetables, or milk products 0 No I have three or more drinks of beer, liquor or wine almost every day 0 No I have tooth or mouth problems that make it hard for me to eat 0 No I don't always have enough money to buy the food I need 0 No I eat alone most of the time 0 No I take three or more different prescribed or over-the-counter drugs a day 0 No Without wanting to, I have lost or gained 10 pounds in the last six months 0 No I am not always physically able to shop, cook and/or feed myself 0 No Nutrition Protocols Good Risk Protocol 0 No interventions needed Moderate Risk Protocol High Risk Proctocol Risk Level: Good Risk Score: 0 Electronic Signature(s) Signed: 04/22/2019 5:15:52 PM By: Gretta Cool, BSN, RN, CWS, Kim RN, BSN Entered By: Gretta Cool, BSN, RN, CWS, Kim on 04/22/2019 14:32:40

## 2019-04-29 ENCOUNTER — Encounter: Payer: Medicare HMO | Admitting: Physician Assistant

## 2019-04-29 ENCOUNTER — Other Ambulatory Visit: Payer: Self-pay

## 2019-04-29 DIAGNOSIS — I890 Lymphedema, not elsewhere classified: Secondary | ICD-10-CM | POA: Diagnosis not present

## 2019-04-29 DIAGNOSIS — G8929 Other chronic pain: Secondary | ICD-10-CM | POA: Diagnosis not present

## 2019-04-29 DIAGNOSIS — S81801A Unspecified open wound, right lower leg, initial encounter: Secondary | ICD-10-CM | POA: Diagnosis not present

## 2019-04-29 DIAGNOSIS — I10 Essential (primary) hypertension: Secondary | ICD-10-CM | POA: Diagnosis not present

## 2019-04-29 DIAGNOSIS — Z9049 Acquired absence of other specified parts of digestive tract: Secondary | ICD-10-CM | POA: Diagnosis not present

## 2019-04-29 DIAGNOSIS — Z85038 Personal history of other malignant neoplasm of large intestine: Secondary | ICD-10-CM | POA: Diagnosis not present

## 2019-04-29 NOTE — Progress Notes (Addendum)
COUTNEY, CARDO ([id number]) Visit Report for 04/29/2019 Arrival Information Details Patient Name: [name], [name] Date of Service: 04/29/2019 2:45 PM Medical Record Number: [id number] Patient Account Number: [id number] Date of Birth/Sex: 07-14-1928 (86 y.o. F) Treating RN: Army Melia Primary Care Kenyatta Keidel: [name] Other Clinician: Referring Gerhart Ruggieri: [name] Treating Gordana Kewley/Extender: Melburn Hake, HOYT Weeks in Treatment: 1 Visit Information History Since Last Visit Added or deleted any medications: No Patient Arrived: Walker Any new allergies or adverse reactions: No Arrival Time: 15:05 Had a fall or experienced change in No Accompanied By: husband activities of daily living that may affect Transfer Assistance: None risk of falls: Patient Identification Verified: Yes Signs or symptoms of abuse/neglect since last visito No Patient Requires Transmission-Based Precautions: No Hospitalized since last visit: No Patient Has Alerts: No Has Dressing in Place as Prescribed: Yes Pain Present Now: No Electronic Signature(s) Signed: 04/30/2019 10:13:19 AM By: Army Melia Entered By: Army Melia on 04/29/2019 15:06:02 [name] ([id number]) -------------------------------------------------------------------------------- Clinic Level of Care Assessment Details Patient Name: [name] Date of Service: 04/29/2019 2:45 PM Medical Record Number: [id number] Patient Account Number: [id number] Date of Birth/Sex: 28-Oct-1928 (86 y.o. F) Treating RN: Army Melia Primary Care Tearah Saulsbury: [name] Other Clinician: Referring Geraldene Eisel: [name] Treating Yonis Carreon/Extender: Melburn Hake, HOYT Weeks in Treatment: 1 Clinic Level of Care Assessment Items TOOL 4 Quantity Score []  - Use when only an EandM is performed on FOLLOW-UP visit 0 ASSESSMENTS - Nursing Assessment / Reassessment X - Reassessment of Co-morbidities (includes updates in patient status) 1 10 X- 1  5 Reassessment of Adherence to Treatment Plan ASSESSMENTS - Wound and Skin Assessment / Reassessment X - Simple Wound Assessment / Reassessment - one wound 1 5 []  - 0 Complex Wound Assessment / Reassessment - multiple wounds []  - 0 Dermatologic / Skin Assessment (not related to wound area) ASSESSMENTS - Focused Assessment []  - Circumferential Edema Measurements - multi extremities 0 []  - 0 Nutritional Assessment / Counseling / Intervention []  - 0 Lower Extremity Assessment (monofilament, tuning fork, pulses) []  - 0 Peripheral Arterial Disease Assessment (using hand held doppler) ASSESSMENTS - Ostomy and/or Continence Assessment and Care []  - Incontinence Assessment and Management 0 []  - 0 Ostomy Care Assessment and Management (repouching, etc.) PROCESS - Coordination of Care X - Simple Patient / Family Education for ongoing care 1 15 []  - 0 Complex (extensive) Patient / Family Education for ongoing care []  - 0 Staff obtains Programmer, systems, Records, Test Results / Process Orders []  - 0 Staff telephones HHA, Nursing Homes / Clarify orders / etc []  - 0 Routine Transfer to another Facility (non-emergent condition) []  - 0 Routine Hospital Admission (non-emergent condition) []  - 0 New Admissions / Biomedical engineer / Ordering NPWT, Apligraf, etc. []  - 0 Emergency Hospital Admission (emergent condition) X- 1 10 Simple Discharge Coordination []  - 0 Complex (extensive) Discharge Coordination PROCESS - Special Needs []  - Pediatric / Minor Patient Management 0 []  - 0 Isolation Patient Management []  - 0 Hearing / Language / Visual special needs []  - 0 Assessment of Community assistance (transportation, D/C planning, etc.) Cupo, Ayani N. ([id number]) []  - 0 Additional assistance / Altered mentation []  - 0 Support Surface(s) Assessment (bed, cushion, seat, etc.) INTERVENTIONS - Wound Cleansing / Measurement X - Simple Wound Cleansing - one wound 1 5 []  - 0 Complex Wound  Cleansing - multiple wounds X- 1 5 Wound Imaging (photographs - any number of wounds) []  - 0 Wound Tracing (instead of photographs) X-  1 5 Simple Wound Measurement - one wound []  - 0 Complex Wound Measurement - multiple wounds INTERVENTIONS - Wound Dressings []  - Small Wound Dressing one or multiple wounds 0 X- 1 15 Medium Wound Dressing one or multiple wounds []  - 0 Large Wound Dressing one or multiple wounds []  - 0 Application of Medications - topical []  - 0 Application of Medications - injection INTERVENTIONS - Miscellaneous []  - External ear exam 0 []  - 0 Specimen Collection (cultures, biopsies, blood, body fluids, etc.) []  - 0 Specimen(s) / Culture(s) sent or taken to Lab for analysis []  - 0 Patient Transfer (multiple staff / Civil Service fast streamer / Similar devices) []  - 0 Simple Staple / Suture removal (25 or less) []  - 0 Complex Staple / Suture removal (26 or more) []  - 0 Hypo / Hyperglycemic Management (close monitor of Blood Glucose) []  - 0 Ankle / Brachial Index (ABI) - do not check if billed separately X- 1 5 Vital Signs Has the patient been seen at the hospital within the last three years: Yes Total Score: 80 Level Of Care: New/Established - Level 3 Electronic Signature(s) Signed: 04/30/2019 10:13:19 AM By: Army Melia Entered By: Army Melia on 04/29/2019 15:31:25 [name] ([id number]) -------------------------------------------------------------------------------- Encounter Discharge Information Details Patient Name: [name] Date of Service: 04/29/2019 2:45 PM Medical Record Number: [id number] Patient Account Number: [id number] Date of Birth/Sex: 02/08/1929 (86 y.o. F) Treating RN: Army Melia Primary Care Joesphine Schemm: [name] Other Clinician: Referring Tenika Keeran: [name] Treating Lenoard Helbert/Extender: Melburn Hake, HOYT Weeks in Treatment: 1 Encounter Discharge Information Items Discharge Condition: Stable Ambulatory Status:  Walker Discharge Destination: Home Transportation: Private Auto Accompanied By: spouse Schedule Follow-up Appointment: Yes Clinical Summary of Care: Electronic Signature(s) Signed: 04/30/2019 10:13:19 AM By: Army Melia Entered By: Army Melia on 04/29/2019 15:32:29 [name] ([id number]) -------------------------------------------------------------------------------- Lower Extremity Assessment Details Patient Name: [name] Date of Service: 04/29/2019 2:45 PM Medical Record Number: [id number] Patient Account Number: [id number] Date of Birth/Sex: [birth year] (86 y.o. F) Treating RN: Army Melia Primary Care Drea Jurewicz: [name] Other Clinician: Referring Alanya Vukelich: [name] Treating Tauri Ethington/Extender: STONE III, HOYT Weeks in Treatment: 1 Edema Assessment Assessed: [Left: No] [Right: No] Edema: [Left: N] [Right: o] Vascular Assessment Pulses: Dorsalis Pedis Palpable: [Right:Yes] Electronic Signature(s) Signed: 04/30/2019 10:13:19 AM By: Army Melia Entered By: Army Melia on 04/29/2019 15:10:31 [name] ([id number]) -------------------------------------------------------------------------------- Multi Wound Chart Details Patient Name: [name] Date of Service: 04/29/2019 2:45 PM Medical Record Number: [id number] Patient Account Number: [id number] Date of Birth/Sex: 01-07-1929 (86 y.o. F) Treating RN: Army Melia Primary Care Solymar Grace: [name] Other Clinician: Referring Kyley Solow: [name] Treating Quante Pettry/Extender: Melburn Hake, HOYT Weeks in Treatment: 1 Vital Signs Height(in): 62 Pulse(bpm): 57 Weight(lbs): 145 Blood Pressure(mmHg): 183/59 Body Mass Index(BMI): 27 Temperature(F): 98.8 Respiratory Rate(breaths/min): 16 Photos: [N/A:N/A] Wound Location: Right Lower Leg - Posterior N/A N/A Wounding Event: Trauma N/A N/A Primary Etiology: Trauma, Other N/A N/A Comorbid History: Anemia, Hypertension, N/A  N/A Osteoarthritis, Received Chemotherapy Date Acquired: 04/08/2019 N/A N/A Weeks of Treatment: 1 N/A N/A Wound Status: Open N/A N/A Measurements L x W x D (cm) 2.5x3x0.1 N/A N/A Area (cm) : 5.89 N/A N/A Volume (cm) : 0.589 N/A N/A % Reduction in Area: -11.10% N/A N/A % Reduction in Volume: -11.10% N/A N/A Classification: Full Thickness Without Exposed N/A N/A Support Structures Exudate Amount: Medium N/A N/A Exudate Type: Serous N/A N/A Exudate Color: amber N/A N/A Wound Margin: Flat and Intact N/A N/A Granulation Amount:  Large (67-100%) N/A N/A Necrotic Amount: Small (1-33%) N/A N/A Epithelialization: Small (1-33%) N/A N/A Treatment Notes Electronic Signature(s) Signed: 04/30/2019 10:13:19 AM By: Army Melia Entered By: Army Melia on 04/29/2019 15:30:20 [name] ([id number]) -------------------------------------------------------------------------------- [place] Details Patient Name: [name] Date of Service: 04/29/2019 2:45 PM Medical Record Number: [id number] Patient Account Number: [id number] Date of Birth/Sex: 1929-01-11 (86 y.o. F) Treating RN: Army Melia Primary Care Inman Fettig: [name] Other Clinician: Referring Clarabel Marion: [name] Treating Vadim Centola/Extender: Melburn Hake, HOYT Weeks in Treatment: 1 Active Inactive Abuse / Safety / Falls / Self Care Management Nursing Diagnoses: History of Falls Goals: Patient/caregiver will verbalize understanding of the importance to maintain current immunizations/vaccinations Date Initiated: 04/22/2019 Target Resolution Date: 05/24/2019 Goal Status: Active Interventions: Provide education on fall prevention Notes: Orientation to the Wound Care Program Nursing Diagnoses: Knowledge deficit related to the wound healing center program Goals: Patient/caregiver will verbalize understanding of the [facility] Program Date Initiated: 04/22/2019 Target Resolution Date:  05/25/2019 Goal Status: Active Interventions: Provide education on orientation to the wound center Notes: Wound/Skin Impairment Nursing Diagnoses: Impaired tissue integrity Goals: Ulcer/skin breakdown will have a volume reduction of 30% by week 4 Date Initiated: 04/22/2019 Target Resolution Date: 05/24/2019 Goal Status: Active Interventions: Assess ulceration(s) every visit Notes: Electronic Signature(s) Signed: 04/30/2019 10:13:19 AM By: Army Melia Entered By: Army Melia on 04/29/2019 15:30:08 [name] ([id number]) [name] ([id number]) -------------------------------------------------------------------------------- Pain Assessment Details Patient Name: [name] Date of Service: 04/29/2019 2:45 PM Medical Record Number: [id number] Patient Account Number: [id number] Date of Birth/Sex: 03-15-1928 (86 y.o. F) Treating RN: Army Melia Primary Care Sanja Elizardo: [name] Other Clinician: Referring Deldrick Linch: [name] Treating Dyllan Hughett/Extender: Melburn Hake, HOYT Weeks in Treatment: 1 Active Problems Location of Pain Severity and Description of Pain Patient Has Paino No Site Locations Pain Management and Medication Current Pain Management: Electronic Signature(s) Signed: 04/30/2019 10:13:19 AM By: Army Melia Entered By: Army Melia on 04/29/2019 15:09:09 [name] ([id number]) -------------------------------------------------------------------------------- Patient/Caregiver Education Details Patient Name: [name] Date of Service: 04/29/2019 2:45 PM Medical Record Number: [id number] Patient Account Number: [id number] Date of Birth/Gender: 10-15-1928 (86 y.o. F) Treating RN: Army Melia Primary Care Physician: [name] Other Clinician: Referring Physician: [name] Treating Physician/Extender: Sharalyn Ink in Treatment: 1 Education Assessment Education Provided To: Patient Education Topics  Provided Wound/Skin Impairment: Handouts: Caring for Your Ulcer Methods: Demonstration, Explain/Verbal Responses: State content correctly Electronic Signature(s) Signed: 04/30/2019 10:13:19 AM By: Army Melia Entered By: Army Melia on 04/29/2019 15:31:37 [name] ([id number]) -------------------------------------------------------------------------------- Wound Assessment Details Patient Name: [name] Date of Service: 04/29/2019 2:45 PM Medical Record Number: [id number] Patient Account Number: [id number] Date of Birth/Sex: Feb 03, 1929 (86 y.o. F) Treating RN: Army Melia Primary Care Pearlina Friedly: [name] Other Clinician: Referring Jhanae Jaskowiak: [name] Treating Felisa Zechman/Extender: Melburn Hake, HOYT Weeks in Treatment: 1 Wound Status Wound Number: 3 Primary Trauma, Other Etiology: Wound Location: Right Lower Leg - Posterior Wound Status: Open Wounding Event: Trauma Comorbid Anemia, Hypertension, Osteoarthritis, Received Date Acquired: 04/08/2019 History: Chemotherapy Weeks Of Treatment: 1 Clustered Wound: No Photos Wound Measurements Length: (cm) 2.5 Width: (cm) 3 Depth: (cm) 0.1 Area: (cm) 5.89 Volume: (cm) 0.589 % Reduction in Area: -11.1% % Reduction in Volume: -11.1% Epithelialization: Small (1-33%) Tunneling: No Undermining: No Wound Description Classification: Full Thickness Without Exposed Support Structures Wound Margin: Flat and Intact Exudate Amount: Medium Exudate Type: Serous Exudate Color: amber Foul Odor After Cleansing: No Slough/Fibrino No Wound  Bed Granulation Amount: Large (67-100%) Necrotic Amount: Small (1-33%) Necrotic Quality: Adherent Slough Treatment Notes Wound #3 (Right, Posterior Lower Leg) Notes prisma, BFD Electronic Signature(s) Signed: 04/30/2019 10:13:19 AM By: Army Melia Entered By: Army Melia on 04/29/2019 15:10:15 [name]  ([id number]) -------------------------------------------------------------------------------- Vitals Details Patient Name: [name] Date of Service: 04/29/2019 2:45 PM Medical Record Number: [id number] Patient Account Number: [id number] Date of Birth/Sex: Feb 03, 1929 (86 y.o. F) Treating RN: Army Melia Primary Care Ajani Schnieders: [name] Other Clinician: Referring Bailei Buist: [name] Treating Eugean Arnott/Extender: Melburn Hake, HOYT Weeks in Treatment: 1 Vital Signs Time Taken: 15:06 Temperature (F): 98.8 Height (in): 62 Pulse (bpm): 73 Weight (lbs): 145 Respiratory Rate (breaths/min): 16 Body Mass Index (BMI): 26.5 Blood Pressure (mmHg): 183/59 Reference Range: 80 - 120 mg / dl Electronic Signature(s) Signed: 04/30/2019 10:13:19 AM By: Army Melia Entered By: Army Melia on 04/29/2019 15:09:04

## 2019-04-29 NOTE — Progress Notes (Addendum)
[name], [name] ([id number]) Visit Report for 04/29/2019 Chief Complaint Document Details Patient Name: [name], [name] Date of Service: 04/29/2019 2:45 PM Medical Record Number: [id number] Patient Account Number: [id number] Date of Birth/Sex: 12-May-1928 (86 y.o. F) Treating RN: Primary Care Provider: [name] Other Clinician: Referring Provider: [name] Treating Provider/Extender: Melburn Hake, Romelle Muldoon Weeks in Treatment: 1 Information Obtained from: Patient Chief Complaint Traumatic open wound right LE Electronic Signature(s) Signed: 04/29/2019 2:58:42 PM By: Worthy Keeler PA-C Entered By: Worthy Keeler on 04/29/2019 14:58:42 [name] ([id number]) -------------------------------------------------------------------------------- HPI Details Patient Name: [name] Date of Service: 04/29/2019 2:45 PM Medical Record Number: [id number] Patient Account Number: [id number] Date of Birth/Sex: Jan 22, 1929 (86 y.o. F) Treating RN: Primary Care Provider: [name] Other Clinician: Referring Provider: [name] Treating Provider/Extender: Melburn Hake, Adaia Matthies Weeks in Treatment: 1 History of Present Illness HPI Description: 86 year old patient who had a blunt injury to her right lower extremity about a few weeks ago now noticed that the wound had opened out and started draining fluid. She is known to have chronic lower extremity edema which has been there for several years. The PCP feels that the edema has not related to anemia or CHF. The patient's past medical history significant for adenocarcinoma of the colon status post resection, bowel obstruction with adhesions, essential hypertension, vertigo,chronic back pain, status post cholecystectomy, hysterectomy, appendectomy, colon surgery, bowel adhesion resection, hemorrhoid surgery. She has never been a smoker. 06/18/2015 -- the patient was admitted to hospital between 06/11/2015 and 06/14/2015 with an acute  ileitis, hypokalemia and accelerated hypertension. was put on empiric Cipro and Flagyl. She is doing fine at the present time. Readmission: 04/22/2019 patient presents today for readmission here in the clinic though she has not been seen since May 2017. Subsequently she had an injury that occurred 2 weeks ago when she had a traumatic wound when she hit her leg in the posterior aspect on the right. Patient notes that the injury occurred when after coming inside her walker actually fell over and struck her in the back of the leg. Since that time she has been having issues with some discomfort and drainage they had some leftover Santyl. EMS was called out to the house they put a dressing on it but did not pull the skin back down apparently. Unfortunately this means that she really has not healed as well as potentially she otherwise could have. There is no signs of active infection at this time. No fevers, chills, nausea, vomiting, or diarrhea. The patient does have a history of hypertension as well as lymphedema. Her ABIs were noncompressible at this point. No fevers, chills, nausea, vomiting, or diarrhea. 04/29/2019 upon evaluation today patient seems to be doing well with regard to her wounds in general at this point. She has been tolerating the dressing changes without complication. Fortunately there is no evidence of active infection at this point. Overall I think that the wound is appearing better and hopefully will continue to improve with the use of the collagen. Electronic Signature(s) Signed: 04/29/2019 3:34:47 PM By: Worthy Keeler PA-C Entered By: Worthy Keeler on 04/29/2019 15:34:46 [name] ([id number]) -------------------------------------------------------------------------------- Physical Exam Details Patient Name: [name] Date of Service: 04/29/2019 2:45 PM Medical Record Number: [id number] Patient Account Number: [id number] Date of Birth/Sex: [birth year] (86 y.o. F)  Treating RN: Primary Care Provider: [name] Other Clinician: Referring Provider: [name] Treating Provider/Extender: STONE III, Caspar Favila Weeks in Treatment: 1 Constitutional Well-nourished and well-hydrated in  no acute distress. Respiratory normal breathing without difficulty. Psychiatric this patient is able to make decisions and demonstrates good insight into disease process. Alert and Oriented x 3. pleasant and cooperative. Notes His wound bed did have some slough noted on the surface of the wound which I was able to mechanically debride away with saline and gauze no sharp debridement was necessary today. She did have some discomfort but fortunately nothing too significant. Electronic Signature(s) Signed: 04/29/2019 3:35:00 PM By: Worthy Keeler PA-C Entered By: Worthy Keeler on 04/29/2019 15:35:00 [name] ([name]) -------------------------------------------------------------------------------- Physician Orders Details Patient Name: [name] Date of Service: 04/29/2019 2:45 PM Medical Record Number: [name] Patient Account Number: [id number] Date of Birth/Sex: 1928/09/15 (86 y.o. F) Treating RN: Army Melia Primary Care Provider: [name] Other Clinician: Referring Provider: [name] Treating Provider/Extender: Melburn Hake, Rodneisha Bonnet Weeks in Treatment: 1 Verbal / Phone Orders: No Diagnosis Coding ICD-10 Coding Code Description I89.0 Lymphedema, not elsewhere classified S81.801A Unspecified open wound, right lower leg, initial encounter I10 Essential (primary) hypertension Wound Cleansing Wound #3 Right,Posterior Lower Leg o Clean wound with Normal Saline. - in office o Cleanse wound with mild soap and water Primary Wound Dressing Wound #3 Right,Posterior Lower Leg o Silver Collagen Secondary Dressing Wound #3 Right,Posterior Lower Leg o Boardered Foam Dressing Dressing Change Frequency Wound #3 Right,Posterior Lower  Leg o Change dressing every other day. Follow-up Appointments Wound #3 Right,Posterior Lower Leg o Return Appointment in 1 week. Electronic Signature(s) Signed: 04/29/2019 4:46:45 PM By: Worthy Keeler PA-C Signed: 04/30/2019 10:13:19 AM By: Army Melia Entered By: Army Melia on 04/29/2019 15:30:46 [name] ([name]) -------------------------------------------------------------------------------- Problem List Details Patient Name: [name] Date of Service: 04/29/2019 2:45 PM Medical Record Number: [name] Patient Account Number: [id number] Date of Birth/Sex: 1929/01/25 (86 y.o. F) Treating RN: Primary Care Provider: [name] Other Clinician: Referring Provider: [name] Treating Provider/Extender: Melburn Hake, Jaziah Kwasnik Weeks in Treatment: 1 Active Problems ICD-10 Evaluated Encounter Code Description Active Date Today Diagnosis I89.0 Lymphedema, not elsewhere classified 04/22/2019 No Yes S81.801A Unspecified open wound, right lower leg, initial encounter 04/22/2019 No Yes I10 Essential (primary) hypertension 04/22/2019 No Yes Inactive Problems Resolved Problems Electronic Signature(s) Signed: 04/29/2019 2:58:36 PM By: Worthy Keeler PA-C Entered By: Worthy Keeler on 04/29/2019 14:58:36 [name] ([name]) -------------------------------------------------------------------------------- Progress Note Details Patient Name: [name] Date of Service: 04/29/2019 2:45 PM Medical Record Number: [name] Patient Account Number: [id number] Date of Birth/Sex: Jul 16, 1928 (86 y.o. F) Treating RN: Primary Care Provider: [name] Other Clinician: Referring Provider: [name] Treating Provider/Extender: Melburn Hake, Saray Capasso Weeks in Treatment: 1 Subjective Chief Complaint Information obtained from Patient Traumatic open wound right LE History of Present Illness (HPI) 86 year old patient who had a blunt injury to her right lower  extremity about a few weeks ago now noticed that the wound had opened out and started draining fluid. She is known to have chronic lower extremity edema which has been there for several years. The PCP feels that the edema has not related to anemia or CHF. The patient's past medical history significant for adenocarcinoma of the colon status post resection, bowel obstruction with adhesions, essential hypertension, vertigo,chronic back pain, status post cholecystectomy, hysterectomy, appendectomy, colon surgery, bowel adhesion resection, hemorrhoid surgery. She has never been a smoker. 06/18/2015 -- the patient was admitted to hospital between 06/11/2015 and 06/14/2015 with an acute ileitis, hypokalemia and accelerated hypertension. was put on empiric Cipro and Flagyl. She is doing fine  at the present time. Readmission: 04/22/2019 patient presents today for readmission here in the clinic though she has not been seen since May 2017. Subsequently she had an injury that occurred 2 weeks ago when she had a traumatic wound when she hit her leg in the posterior aspect on the right. Patient notes that the injury occurred when after coming inside her walker actually fell over and struck her in the back of the leg. Since that time she has been having issues with some discomfort and drainage they had some leftover Santyl. EMS was called out to the house they put a dressing on it but did not pull the skin back down apparently. Unfortunately this means that she really has not healed as well as potentially she otherwise could have. There is no signs of active infection at this time. No fevers, chills, nausea, vomiting, or diarrhea. The patient does have a history of hypertension as well as lymphedema. Her ABIs were noncompressible at this point. No fevers, chills, nausea, vomiting, or diarrhea. 04/29/2019 upon evaluation today patient seems to be doing well with regard to her wounds in general at this point. She has  been tolerating the dressing changes without complication. Fortunately there is no evidence of active infection at this point. Overall I think that the wound is appearing better and hopefully will continue to improve with the use of the collagen. Objective Constitutional Well-nourished and well-hydrated in no acute distress. Vitals Time Taken: 3:06 PM, Height: 62 in, Weight: 145 lbs, BMI: 26.5, Temperature: 98.8 F, Pulse: 73 bpm, Respiratory Rate: 16 breaths/min, Blood Pressure: 183/59 mmHg. Respiratory normal breathing without difficulty. Psychiatric this patient is able to make decisions and demonstrates good insight into disease process. Alert and Oriented x 3. pleasant and cooperative. General Notes: His wound bed did have some slough noted on the surface of the wound which I was able to mechanically debride away with saline and gauze no sharp debridement was necessary today. She did have some discomfort but fortunately nothing too significant. [name], [name] N. ([name]) Integumentary (Hair, Skin) Wound #3 status is Open. Original cause of wound was Trauma. The wound is located on the Right,Posterior Lower Leg. The wound measures 2.5cm length x 3cm width x 0.1cm depth; 5.89cm^2 area and 0.589cm^3 volume. There is no tunneling or undermining noted. There is a medium amount of serous drainage noted. The wound margin is flat and intact. There is large (67-100%) granulation within the wound bed. There is a small (1-33%) amount of necrotic tissue within the wound bed including Adherent Slough. Assessment Active Problems ICD-10 Lymphedema, not elsewhere classified Unspecified open wound, right lower leg, initial encounter Essential (primary) hypertension Plan Wound Cleansing: Wound #3 Right,Posterior Lower Leg: Clean wound with Normal Saline. - in office Cleanse wound with mild soap and water Primary Wound Dressing: Wound #3 Right,Posterior Lower Leg: Silver Collagen Secondary  Dressing: Wound #3 Right,Posterior Lower Leg: Boardered Foam Dressing Dressing Change Frequency: Wound #3 Right,Posterior Lower Leg: Change dressing every other day. Follow-up Appointments: Wound #3 Right,Posterior Lower Leg: Return Appointment in 1 week. 1. My suggestion at this time is good to be that we continue with the current wound care measures utilizing the silver collagen dressing the patient is in agreement the plan as is her husband. 2. I am also going to recommend at this time that we continue with having her use her own compression stockings she wears this every day and that does seem to be controlling her drainage. 3. We will use a  border foam dressing to cover this seems to be doing well underneath her compression. 4. I am also going torecommend the patient continue to elevate her legs is much as possible. We will see patient back for reevaluation in 1 week here in the clinic. If anything worsens or changes patient will contact our office for additional recommendations. Electronic Signature(s) Signed: 04/29/2019 3:35:45 PM By: Worthy Keeler PA-C Entered By: Worthy Keeler on 04/29/2019 15:35:44 [name] ([id number]) -------------------------------------------------------------------------------- SuperBill Details Patient Name: [name] Date of Service: 04/29/2019 Medical Record Number: [id number] Patient Account Number: [id number] Date of Birth/Sex: [birth year] (86 y.o. F) Treating RN: Primary Care Provider: [name] Other Clinician: Referring Provider: [name] Treating Provider/Extender: Melburn Hake, Masako Overall Weeks in Treatment: 1 Diagnosis Coding ICD-10 Codes Code Description I89.0 Lymphedema, not elsewhere classified S81.801A Unspecified open wound, right lower leg, initial encounter Tamms (primary) hypertension Facility Procedures CPT4 Code: [id number] Description: [id number] - WOUND CARE VISIT-LEV 3 EST PT Modifier: Quantity:  1 Physician Procedures CPT4 [id number] Description: [id number] - WC PHYS LEVEL 3 - EST PT Modifier: Quantity: 1 CPT4 Code: Description: ICD-10 Diagnosis Description I89.0 Lymphedema, not elsewhere classified S81.801A Unspecified open wound, right lower leg, initial encounter I10 Essential (primary) hypertension Modifier: Quantity: Electronic Signature(s) Signed: 04/29/2019 3:35:54 PM By: Worthy Keeler PA-C Entered By: Worthy Keeler on 04/29/2019 15:35:54

## 2019-05-06 ENCOUNTER — Encounter: Payer: Medicare HMO | Admitting: Physician Assistant

## 2019-05-06 ENCOUNTER — Other Ambulatory Visit: Payer: Self-pay

## 2019-05-06 DIAGNOSIS — I10 Essential (primary) hypertension: Secondary | ICD-10-CM | POA: Diagnosis not present

## 2019-05-06 DIAGNOSIS — G8929 Other chronic pain: Secondary | ICD-10-CM | POA: Diagnosis not present

## 2019-05-06 DIAGNOSIS — I890 Lymphedema, not elsewhere classified: Secondary | ICD-10-CM | POA: Diagnosis not present

## 2019-05-06 DIAGNOSIS — Z85038 Personal history of other malignant neoplasm of large intestine: Secondary | ICD-10-CM | POA: Diagnosis not present

## 2019-05-06 DIAGNOSIS — Z9049 Acquired absence of other specified parts of digestive tract: Secondary | ICD-10-CM | POA: Diagnosis not present

## 2019-05-06 DIAGNOSIS — S81801A Unspecified open wound, right lower leg, initial encounter: Secondary | ICD-10-CM | POA: Diagnosis not present

## 2019-05-06 NOTE — Progress Notes (Addendum)
[name] ([id number]) Visit Report for 05/06/2019 Arrival Information Details Patient Name: [name] Date of Service: 05/06/2019 2:45 PM Medical Record Number: [id number] Patient Account Number: [id number] Date of Birth/Sex: 09/02/1928 (86 y.o. F) Treating RN: Montey Hora Primary Care Joeline Freer: [name] Other Clinician: Referring Lauro Manlove: [name] Treating Mercedies Ganesh/Extender: Melburn Hake, HOYT Weeks in Treatment: 2 Visit Information History Since Last Visit Added or deleted any medications: No Patient Arrived: Walker Any new allergies or adverse reactions: No Arrival Time: 14:49 Had a fall or experienced change in No Accompanied By: husband activities of daily living that may affect Transfer Assistance: None risk of falls: Patient Identification Verified: Yes Signs or symptoms of abuse/neglect since last visito No Secondary Verification Process Completed: Yes Hospitalized since last visit: No Patient Requires Transmission-Based Precautions: No Implantable device outside of the clinic excluding No Patient Has Alerts: No cellular tissue based products placed in the center since last visit: Has Dressing in Place as Prescribed: Yes Pain Present Now: No Electronic Signature(s) Signed: 05/06/2019 4:17:09 PM By: Montey Hora Entered By: Montey Hora on 05/06/2019 14:50:04 [name] ([id number]) -------------------------------------------------------------------------------- Clinic Level of Care Assessment Details Patient Name: [name] Date of Service: 05/06/2019 2:45 PM Medical Record Number: [id number] Patient Account Number: [id number] Date of Birth/Sex: 03/27/1928 (86 y.o. F) Treating RN: Army Melia Primary Care Kimberlie Csaszar: [name] Other Clinician: Referring Vernica Wachtel: [name] Treating Jayci Ellefson/Extender: Melburn Hake, HOYT Weeks in Treatment: 2 Clinic Level of Care Assessment Items TOOL 4 Quantity Score []  - Use when  only an EandM is performed on FOLLOW-UP visit 0 ASSESSMENTS - Nursing Assessment / Reassessment X - Reassessment of Co-morbidities (includes updates in patient status) 1 10 X- 1 5 Reassessment of Adherence to Treatment Plan ASSESSMENTS - Wound and Skin Assessment / Reassessment X - Simple Wound Assessment / Reassessment - one wound 1 5 []  - 0 Complex Wound Assessment / Reassessment - multiple wounds []  - 0 Dermatologic / Skin Assessment (not related to wound area) ASSESSMENTS - Focused Assessment []  - Circumferential Edema Measurements - multi extremities 0 []  - 0 Nutritional Assessment / Counseling / Intervention X- 1 5 Lower Extremity Assessment (monofilament, tuning fork, pulses) []  - 0 Peripheral Arterial Disease Assessment (using hand held doppler) ASSESSMENTS - Ostomy and/or Continence Assessment and Care []  - Incontinence Assessment and Management 0 []  - 0 Ostomy Care Assessment and Management (repouching, etc.) PROCESS - Coordination of Care X - Simple Patient / Family Education for ongoing care 1 15 []  - 0 Complex (extensive) Patient / Family Education for ongoing care []  - 0 Staff obtains Programmer, systems, Records, Test Results / Process Orders []  - 0 Staff telephones HHA, Nursing Homes / Clarify orders / etc []  - 0 Routine Transfer to another Facility (non-emergent condition) []  - 0 Routine Hospital Admission (non-emergent condition) []  - 0 New Admissions / Biomedical engineer / Ordering NPWT, Apligraf, etc. []  - 0 Emergency Hospital Admission (emergent condition) X- 1 10 Simple Discharge Coordination []  - 0 Complex (extensive) Discharge Coordination PROCESS - Special Needs []  - Pediatric / Minor Patient Management 0 []  - 0 Isolation Patient Management []  - 0 Hearing / Language / Visual special needs []  - 0 Assessment of Community assistance (transportation, D/C planning, etc.) Wiederhold, Gwendelyn N. ([id number]) []  - 0 Additional assistance / Altered  mentation []  - 0 Support Surface(s) Assessment (bed, cushion, seat, etc.) INTERVENTIONS - Wound Cleansing / Measurement X - Simple Wound Cleansing - one wound 1 5 []  - 0 Complex Wound  Cleansing - multiple wounds X- 1 5 Wound Imaging (photographs - any number of wounds) []  - 0 Wound Tracing (instead of photographs) X- 1 5 Simple Wound Measurement - one wound []  - 0 Complex Wound Measurement - multiple wounds INTERVENTIONS - Wound Dressings []  - Small Wound Dressing one or multiple wounds 0 X- 1 15 Medium Wound Dressing one or multiple wounds []  - 0 Large Wound Dressing one or multiple wounds []  - 0 Application of Medications - topical []  - 0 Application of Medications - injection INTERVENTIONS - Miscellaneous []  - External ear exam 0 []  - 0 Specimen Collection (cultures, biopsies, blood, body fluids, etc.) []  - 0 Specimen(s) / Culture(s) sent or taken to Lab for analysis []  - 0 Patient Transfer (multiple staff / Civil Service fast streamer / Similar devices) []  - 0 Simple Staple / Suture removal (25 or less) []  - 0 Complex Staple / Suture removal (26 or more) []  - 0 Hypo / Hyperglycemic Management (close monitor of Blood Glucose) []  - 0 Ankle / Brachial Index (ABI) - do not check if billed separately X- 1 5 Vital Signs Has the patient been seen at the hospital within the last three years: Yes Total Score: 85 Level Of Care: New/Established - Level 3 Electronic Signature(s) Signed: 05/06/2019 4:14:01 PM By: Army Melia Entered By: Army Melia on 05/06/2019 15:23:57 [name] ([id number]) -------------------------------------------------------------------------------- Encounter Discharge Information Details Patient Name: [name] Date of Service: 05/06/2019 2:45 PM Medical Record Number: [id number] Patient Account Number: [id number] Date of Birth/Sex: 07-07-1928 (86 y.o. F) Treating RN: Army Melia Primary Care Jowan Skillin: [name] Other Clinician: Referring  Erick Oxendine: [name] Treating Mayar Whittier/Extender: Melburn Hake, HOYT Weeks in Treatment: 2 Encounter Discharge Information Items Discharge Condition: Stable Ambulatory Status: Walker Discharge Destination: Home Transportation: Private Auto Accompanied By: spouse Schedule Follow-up Appointment: Yes Clinical Summary of Care: Electronic Signature(s) Signed: 05/06/2019 4:14:01 PM By: Army Melia Entered By: Army Melia on 05/06/2019 15:24:43 [name] ([id number]) -------------------------------------------------------------------------------- Lower Extremity Assessment Details Patient Name: [name] Date of Service: 05/06/2019 2:45 PM Medical Record Number: [id number] Patient Account Number: [id number] Date of Birth/Sex: [birth year] (86 y.o. F) Treating RN: Montey Hora Primary Care Evy Lutterman: [name] Other Clinician: Referring Stellan Vick: [name] Treating Jolly Carlini/Extender: Melburn Hake, HOYT Weeks in Treatment: 2 Edema Assessment Assessed: [Left: No] [Right: No] Edema: [Left: Yes] [Right: Yes] Calf Left: Right: Point of Measurement: 30 cm From Medial Instep cm 34 cm Ankle Left: Right: Point of Measurement: 10 cm From Medial Instep cm 24 cm Vascular Assessment Pulses: Dorsalis Pedis Palpable: [Right:Yes] Notes heel to knee length 40cm Electronic Signature(s) Signed: 05/06/2019 4:17:09 PM By: Montey Hora Entered By: Montey Hora on 05/06/2019 14:58:16 [name] ([id number]) -------------------------------------------------------------------------------- Multi Wound Chart Details Patient Name: [name] Date of Service: 05/06/2019 2:45 PM Medical Record Number: [id number] Patient Account Number: [id number] Date of Birth/Sex: March 18, 1928 (86 y.o. F) Treating RN: Army Melia Primary Care Carolene Gitto: [name] Other Clinician: Referring Rubyann Lingle: [name] Treating Nastassja Witkop/Extender: Melburn Hake, HOYT Weeks in Treatment:  2 Vital Signs Height(in): 62 Pulse(bpm): 73 Weight(lbs): 145 Blood Pressure(mmHg): 160/49 Body Mass Index(BMI): 27 Temperature(F): 98.4 Respiratory Rate(breaths/min): 16 Photos: [N/A:N/A] Wound Location: Right Lower Leg - Posterior N/A N/A Wounding Event: Trauma N/A N/A Primary Etiology: Trauma, Other N/A N/A Comorbid History: Anemia, Hypertension, N/A N/A Osteoarthritis, Received Chemotherapy Date Acquired: 04/08/2019 N/A N/A Weeks of Treatment: 2 N/A N/A Wound Status: Open N/A N/A Measurements L x W x D (cm) 2x2.3x0.1 N/A N/A Area (  cm) : 3.613 N/A N/A Volume (cm) : 0.361 N/A N/A % Reduction in Area: 31.80% N/A N/A % Reduction in Volume: 31.90% N/A N/A Classification: Full Thickness Without Exposed N/A N/A Support Structures Exudate Amount: Medium N/A N/A Exudate Type: Serous N/A N/A Exudate Color: amber N/A N/A Wound Margin: Flat and Intact N/A N/A Granulation Amount: Large (67-100%) N/A N/A Necrotic Amount: Small (1-33%) N/A N/A Exposed Structures: Fat Layer (Subcutaneous Tissue) N/A N/A Exposed: Yes Fascia: No Tendon: No Muscle: No Joint: No Bone: No Epithelialization: Small (1-33%) N/A N/A Treatment Notes Electronic Signature(s) Signed: 05/06/2019 4:14:01 PM By: Army Melia Entered By: Army Melia on 05/06/2019 15:22:09 [name] ([id number] [name] ([id number]) -------------------------------------------------------------------------------- [place] Details Patient Name: [name] Date of Service: 05/06/2019 2:45 PM Medical Record Number: [id number] Patient Account Number: [id number] Date of Birth/Sex: [birth year] (86 y.o. F) Treating RN: Army Melia Primary Care Domenico Achord: [name] Other Clinician: Referring Joelene Barriere: [name] Treating Sayda Grable/Extender: Melburn Hake, HOYT Weeks in Treatment: 2 Active Inactive Abuse / Safety / Falls / Self Care Management Nursing Diagnoses: History of  Falls Goals: Patient/caregiver will verbalize understanding of the importance to maintain current immunizations/vaccinations Date Initiated: 04/22/2019 Target Resolution Date: 05/24/2019 Goal Status: Active Interventions: Provide education on fall prevention Notes: Orientation to the Wound Care Program Nursing Diagnoses: Knowledge deficit related to the wound healing center program Goals: Patient/caregiver will verbalize understanding of the [place] Program Date Initiated: 04/22/2019 Target Resolution Date: 05/25/2019 Goal Status: Active Interventions: Provide education on orientation to the wound center Notes: Wound/Skin Impairment Nursing Diagnoses: Impaired tissue integrity Goals: Ulcer/skin breakdown will have a volume reduction of 30% by week 4 Date Initiated: 04/22/2019 Target Resolution Date: 05/24/2019 Goal Status: Active Interventions: Assess ulceration(s) every visit Notes: Electronic Signature(s) Signed: 05/06/2019 4:14:01 PM By: Army Melia Entered By: Army Melia on 05/06/2019 15:22:02 [name] ([id number] [name] ([id number]) -------------------------------------------------------------------------------- Pain Assessment Details Patient Name: [name] Date of Service: 05/06/2019 2:45 PM Medical Record Number: [id number] Patient Account Number: [id number] Date of Birth/Sex: Feb 04, 1929 (86 y.o. F) Treating RN: Montey Hora Primary Care Roderick Sweezy: [name] Other Clinician: Referring Kaspar Albornoz: [name] Treating Muhammed Teutsch/Extender: Melburn Hake, HOYT Weeks in Treatment: 2 Active Problems Location of Pain Severity and Description of Pain Patient Has Paino No Site Locations Pain Management and Medication Current Pain Management: Electronic Signature(s) Signed: 05/06/2019 4:17:09 PM By: Montey Hora Entered By: Montey Hora on 05/06/2019 14:50:13 [name]  ([id number]) -------------------------------------------------------------------------------- Patient/Caregiver Education Details Patient Name: [name] Date of Service: 05/06/2019 2:45 PM Medical Record Number: [id number] Patient Account Number: [id number] Date of Birth/Gender: 1928-02-16 (86 y.o. F) Treating RN: Army Melia Primary Care Physician: [name] Other Clinician: Referring Physician: [name] Treating Physician/Extender: Sharalyn Ink in Treatment: 2 Education Assessment Education Provided To: Patient Education Topics Provided Wound/Skin Impairment: Handouts: Caring for Your Ulcer Methods: Demonstration, Explain/Verbal Responses: State content correctly Electronic Signature(s) Signed: 05/06/2019 4:14:01 PM By: Army Melia Entered By: Army Melia on 05/06/2019 15:24:18 [name] ([id number]) -------------------------------------------------------------------------------- Wound Assessment Details Patient Name: [name] Date of Service: 05/06/2019 2:45 PM Medical Record Number: [id number] Patient Account Number: [id number] Date of Birth/Sex: 02-27-1928 (86 y.o. F) Treating RN: Montey Hora Primary Care Etana Beets: [name] Other Clinician: Referring Alexx Giambra: [name] Treating Nare Gaspari/Extender: Melburn Hake, HOYT Weeks in Treatment: 2 Wound Status Wound Number: 3 Primary Trauma, Other Etiology: Wound Location: Right Lower Leg - Posterior Wound Status: Open Wounding Event: Trauma Comorbid  Anemia, Hypertension, Osteoarthritis, Received Date Acquired: 04/08/2019 History: Chemotherapy Weeks Of Treatment: 2 Clustered Wound: No Photos Wound Measurements Length: (cm) 2 Width: (cm) 2.3 Depth: (cm) 0.1 Area: (cm) 3.613 Volume: (cm) 0.361 % Reduction in Area: 31.8% % Reduction in Volume: 31.9% Epithelialization: Small (1-33%) Tunneling: No Undermining: No Wound Description Classification: Full Thickness  Without Exposed Support Structu Wound Margin: Flat and Intact Exudate Amount: Medium Exudate Type: Serous Exudate Color: amber res Foul Odor After Cleansing: No Slough/Fibrino No Wound Bed Granulation Amount: Large (67-100%) Exposed Structure Necrotic Amount: Small (1-33%) Fascia Exposed: No Necrotic Quality: Adherent Slough Fat Layer (Subcutaneous Tissue) Exposed: Yes Tendon Exposed: No Muscle Exposed: No Joint Exposed: No Bone Exposed: No Treatment Notes Wound #3 (Right, Posterior Lower Leg) Notes prisma, BFD Electronic Signature(s) Signed: 05/06/2019 4:17:09 PM By: Jeannie Fend, Jon Billings ([id number]) Entered By: Montey Hora on 05/06/2019 14:56:39 [name] ([id number]) -------------------------------------------------------------------------------- Vitals Details Patient Name: [name] Date of Service: 05/06/2019 2:45 PM Medical Record Number: [id number] Patient Account Number: [id number] Date of Birth/Sex: September 09, 1928 (86 y.o. F) Treating RN: Montey Hora Primary Care Katey Barrie: [name] Other Clinician: Referring Lakiya Cottam: [name] Treating Harjot Dibello/Extender: Melburn Hake, HOYT Weeks in Treatment: 2 Vital Signs Time Taken: 14:50 Temperature (F): 98.4 Height (in): 62 Pulse (bpm): 57 Weight (lbs): 145 Respiratory Rate (breaths/min): 16 Body Mass Index (BMI): 26.5 Blood Pressure (mmHg): 160/49 Reference Range: 80 - 120 mg / dl Electronic Signature(s) Signed: 05/06/2019 4:17:09 PM By: Montey Hora Entered By: Montey Hora on 05/06/2019 14:52:16

## 2019-05-06 NOTE — Progress Notes (Addendum)
[name], [name] ([id number]) Visit Report for 05/06/2019 Chief Complaint Document Details Patient Name: [name], [name] Date of Service: 05/06/2019 2:45 PM Medical Record Number: [id number] Patient Account Number: [id number] Date of Birth/Sex: 1928-05-17 (86 y.o. F) Treating RN: Army Melia Primary Care Provider: [name] Other Clinician: Referring Provider: [name] Treating Provider/Extender: Melburn Hake, Marabella Popiel Weeks in Treatment: 2 Information Obtained from: Patient Chief Complaint Traumatic open wound right LE Electronic Signature(s) Signed: 05/06/2019 2:45:46 PM By: Worthy Keeler PA-C Entered By: Worthy Keeler on 05/06/2019 14:45:46 [name] ([id number]) -------------------------------------------------------------------------------- HPI Details Patient Name: [name] Date of Service: 05/06/2019 2:45 PM Medical Record Number: [id number] Patient Account Number: [id number] Date of Birth/Sex: [birth year] (86 y.o. F) Treating RN: Army Melia Primary Care Provider: [name] Other Clinician: Referring Provider: [name] Treating Provider/Extender: Melburn Hake, Kristyana Notte Weeks in Treatment: 2 History of Present Illness HPI Description: 86 year old patient who had Katrina blunt injury to her right lower extremity about Katrina few weeks ago now noticed that the wound had opened out and started draining fluid. She is known to have chronic lower extremity edema which has been there for several years. The PCP feels that the edema has not related to anemia or CHF. The patient's past medical history significant for adenocarcinoma of the colon status post resection, bowel obstruction with adhesions, essential hypertension, vertigo,chronic back pain, status post cholecystectomy, hysterectomy, appendectomy, colon surgery, bowel adhesion resection, hemorrhoid surgery. She has never been Katrina smoker. 06/18/2015 -- the patient was admitted to hospital between 06/11/2015 and  06/14/2015 with an acute ileitis, hypokalemia and accelerated hypertension. was put on empiric Cipro and Flagyl. She is doing fine at the present time. Readmission: 04/22/2019 patient presents today for readmission here in the clinic though she has not been seen since May 2017. Subsequently she had an injury that occurred 2 weeks ago when she had Katrina traumatic wound when she hit her leg in the posterior aspect on the right. Patient notes that the injury occurred when after coming inside her walker actually fell over and struck her in the back of the leg. Since that time she has been having issues with some discomfort and drainage they had some leftover Santyl. EMS was called out to the house they put Katrina dressing on it but did not pull the skin back down apparently. Unfortunately this means that she really has not healed as well as potentially she otherwise could have. There is no signs of active infection at this time. No fevers, chills, nausea, vomiting, or diarrhea. The patient does have Katrina history of hypertension as well as lymphedema. Her ABIs were noncompressible at this point. No fevers, chills, nausea, vomiting, or diarrhea. 04/29/2019 upon evaluation today patient seems to be doing well with regard to her wounds in general at this point. She has been tolerating the dressing changes without complication. Fortunately there is no evidence of active infection at this point. Overall I think that the wound is appearing better and hopefully will continue to improve with the use of the collagen. 05/06/2019 upon evaluation today patient seems to making good progress with regard to her wounds at this time which is great news. Fortunately there is no signs of active infection. No fevers, chills, nausea, vomiting, or diarrhea. She has been tolerating the dressing changes without complication. Overall I feel like the wound is making great progress and is headed in the correct direction. Electronic  Signature(s) Signed: 05/06/2019 3:26:07 PM By: Worthy Keeler PA-C Entered By:  Worthy Keeler on 05/06/2019 15:26:06 [name], [name] ([id number]) -------------------------------------------------------------------------------- Physical Exam Details Patient Name: [name], [name] Date of Service: 05/06/2019 2:45 PM Medical Record Number: [id number] Patient Account Number: [id number] Date of Birth/Sex: 08-26-1928 (86 y.o. F) Treating RN: Army Melia Primary Care Provider: [name] Other Clinician: Referring Provider: [name] Treating Provider/Extender: STONE III, Jatia Musa Weeks in Treatment: 2 Constitutional Well-nourished and well-hydrated in no acute distress. Respiratory normal breathing without difficulty. Psychiatric this patient is able to make decisions and demonstrates good insight into disease process. Alert and Oriented x 3. pleasant and cooperative. Notes Patient's wound bed currently showed signs of good granulation at this time. Fortunately there is no evidence of active infection which is also good news. No fevers, chills, nausea, vomiting, or diarrhea. Electronic Signature(s) Signed: 05/06/2019 3:26:23 PM By: Worthy Keeler PA-C Entered By: Worthy Keeler on 05/06/2019 15:26:23 [name] ([id number]) -------------------------------------------------------------------------------- Physician Orders Details Patient Name: [name] Date of Service: 05/06/2019 2:45 PM Medical Record Number: [id number] Patient Account Number: [id number] Date of Birth/Sex: 17-Mar-1928 (86 y.o. F) Treating RN: Army Melia Primary Care Provider: [name] Other Clinician: Referring Provider: [name] Treating Provider/Extender: Melburn Hake, Vola Beneke Weeks in Treatment: 2 Verbal / Phone Orders: No Diagnosis Coding ICD-10 Coding Code Description I89.0 Lymphedema, not elsewhere classified S81.801A Unspecified open wound, right lower leg, initial encounter I10  Essential (primary) hypertension Wound Cleansing Wound #3 Right,Posterior Lower Leg o Clean wound with Normal Saline. - in office o Cleanse wound with mild soap and water Primary Wound Dressing Wound #3 Right,Posterior Lower Leg o Silver Collagen Secondary Dressing Wound #3 Right,Posterior Lower Leg o Boardered Foam Dressing Dressing Change Frequency Wound #3 Right,Posterior Lower Leg o Change dressing every other day. Follow-up Appointments Wound #3 Right,Posterior Lower Leg o Return Appointment in 1 week. Electronic Signature(s) Signed: 05/06/2019 4:14:01 PM By: Army Melia Signed: 05/06/2019 4:32:11 PM By: Worthy Keeler PA-C Entered By: Army Melia on 05/06/2019 15:22:30 [name] ([id number]) -------------------------------------------------------------------------------- Problem List Details Patient Name: [name] Date of Service: 05/06/2019 2:45 PM Medical Record Number: [id number] Patient Account Number: [id number] Date of Birth/Sex: [birth year] (86 y.o. F) Treating RN: Army Melia Primary Care Provider: [name] Other Clinician: Referring Provider: [name] Treating Provider/Extender: Melburn Hake, Carlyle Mcelrath Weeks in Treatment: 2 Active Problems ICD-10 Evaluated Encounter Code Description Active Date Today Diagnosis I89.0 Lymphedema, not elsewhere classified 04/22/2019 No Yes S81.801A Unspecified open wound, right lower leg, initial encounter 04/22/2019 No Yes I10 Essential (primary) hypertension 04/22/2019 No Yes Inactive Problems Resolved Problems Electronic Signature(s) Signed: 05/06/2019 2:45:40 PM By: Worthy Keeler PA-C Entered By: Worthy Keeler on 05/06/2019 14:45:39 [name] ([id number]) -------------------------------------------------------------------------------- Progress Note Details Patient Name: [name] Date of Service: 05/06/2019 2:45 PM Medical Record Number: [id number] Patient Account Number:  [id number] Date of Birth/Sex: 12/17/1928 (86 y.o. F) Treating RN: Army Melia Primary Care Provider: [name] Other Clinician: Referring Provider: [name] Treating Provider/Extender: Melburn Hake, Amyrah Pinkhasov Weeks in Treatment: 2 Subjective Chief Complaint Information obtained from Patient Traumatic open wound right LE History of Present Illness (HPI) 86 year old patient who had Katrina blunt injury to her right lower extremity about Katrina few weeks ago now noticed that the wound had opened out and started draining fluid. She is known to have chronic lower extremity edema which has been there for several years. The PCP feels that the edema has not related to anemia or CHF. The patient's past medical history significant for adenocarcinoma of  the colon status post resection, bowel obstruction with adhesions, essential hypertension, vertigo,chronic back pain, status post cholecystectomy, hysterectomy, appendectomy, colon surgery, bowel adhesion resection, hemorrhoid surgery. She has never been Katrina smoker. 06/18/2015 -- the patient was admitted to hospital between 06/11/2015 and 06/14/2015 with an acute ileitis, hypokalemia and accelerated hypertension. was put on empiric Cipro and Flagyl. She is doing fine at the present time. Readmission: 04/22/2019 patient presents today for readmission here in the clinic though she has not been seen since May 2017. Subsequently she had an injury that occurred 2 weeks ago when she had Katrina traumatic wound when she hit her leg in the posterior aspect on the right. Patient notes that the injury occurred when after coming inside her walker actually fell over and struck her in the back of the leg. Since that time she has been having issues with some discomfort and drainage they had some leftover Santyl. EMS was called out to the house they put Katrina dressing on it but did not pull the skin back down apparently. Unfortunately this means that she really has not healed as well as  potentially she otherwise could have. There is no signs of active infection at this time. No fevers, chills, nausea, vomiting, or diarrhea. The patient does have Katrina history of hypertension as well as lymphedema. Her ABIs were noncompressible at this point. No fevers, chills, nausea, vomiting, or diarrhea. 04/29/2019 upon evaluation today patient seems to be doing well with regard to her wounds in general at this point. She has been tolerating the dressing changes without complication. Fortunately there is no evidence of active infection at this point. Overall I think that the wound is appearing better and hopefully will continue to improve with the use of the collagen. 05/06/2019 upon evaluation today patient seems to making good progress with regard to her wounds at this time which is great news. Fortunately there is no signs of active infection. No fevers, chills, nausea, vomiting, or diarrhea. She has been tolerating the dressing changes without complication. Overall I feel like the wound is making great progress and is headed in the correct direction. Objective Constitutional Well-nourished and well-hydrated in no acute distress. Vitals Time Taken: 2:50 PM, Height: 62 in, Weight: 145 lbs, BMI: 26.5, Temperature: 98.4 F, Pulse: 57 bpm, Respiratory Rate: 16 breaths/min, Blood Pressure: 160/49 mmHg. Respiratory normal breathing without difficulty. Psychiatric this patient is able to make decisions and demonstrates good insight into disease process. Alert and Oriented x 3. pleasant and cooperative. AMAJAH, [name] ([id number]) General Notes: Patient's wound bed currently showed signs of good granulation at this time. Fortunately there is no evidence of active infection which is also good news. No fevers, chills, nausea, vomiting, or diarrhea. Integumentary (Hair, Skin) Wound #3 status is Open. Original cause of wound was Trauma. The wound is located on the Right,Posterior Lower Leg. The wound  measures 2cm length x 2.3cm width x 0.1cm depth; 3.613cm^2 area and 0.361cm^3 volume. There is Fat Layer (Subcutaneous Tissue) Exposed exposed. There is no tunneling or undermining noted. There is Katrina medium amount of serous drainage noted. The wound margin is flat and intact. There is large (67-100%) granulation within the wound bed. There is Katrina small (1-33%) amount of necrotic tissue within the wound bed including Adherent Slough. Assessment Active Problems ICD-10 Lymphedema, not elsewhere classified Unspecified open wound, right lower leg, initial encounter Essential (primary) hypertension Plan Wound Cleansing: Wound #3 Right,Posterior Lower Leg: Clean wound with Normal Saline. - in office Cleanse wound with  mild soap and water Primary Wound Dressing: Wound #3 Right,Posterior Lower Leg: Silver Collagen Secondary Dressing: Wound #3 Right,Posterior Lower Leg: Boardered Foam Dressing Dressing Change Frequency: Wound #3 Right,Posterior Lower Leg: Change dressing every other day. Follow-up Appointments: Wound #3 Right,Posterior Lower Leg: Return Appointment in 1 week. 1. I would recommend currently that we go ahead and continue with the wound care measures as before she seems to be doing excellent with her own compression we will see about ordering her Katrina new pair of compression socks she uses the dual layer compression. Subsequently I am also going to suggest that we continue with the silver collagen dressing along with the bordered foam dressing. 2. I am also can recommend that we go ahead and continue with the elevation as much as possible to help with edema control as well obviously the more she can keep her legs elevated the better she would do in that regard to my opinion. 3. I am also going to suggest that the patient continue with monitoring for any signs of infection though I see nothing right now that seems to be Katrina complication or problem. We will see patient back for  reevaluation in 1 week here in the clinic. If anything worsens or changes patient will contact our office for additional recommendations. Electronic Signature(s) Signed: 05/06/2019 3:27:55 PM By: Worthy Keeler PA-C Entered By: Worthy Keeler on 05/06/2019 15:27:55 [name] ([id number]) -------------------------------------------------------------------------------- SuperBill Details Patient Name: [name] Date of Service: 05/06/2019 Medical Record Number: [id number] Patient Account Number: [id number] Date of Birth/Sex: [birth year] (86 y.o. F) Treating RN: Army Melia Primary Care Provider: [name] Other Clinician: Referring Provider: [name] Treating Provider/Extender: Melburn Hake, Delphina Schum Weeks in Treatment: 2 Diagnosis Coding ICD-10 Codes Code Description I89.0 Lymphedema, not elsewhere classified S81.801A Unspecified open wound, right lower leg, initial encounter [place] (primary) hypertension Facility Procedures CPT4 Code: [id number] Description: 99213 - WOUND CARE VISIT-LEV 3 EST PT Modifier: Quantity: 1 Physician Procedures CPT4 [id number] Description: [id number] - WC PHYS LEVEL 3 - EST PT Modifier: Quantity: 1 CPT4 Code: Description: ICD-10 Diagnosis Description I89.0 Lymphedema, not elsewhere classified S81.801A Unspecified open wound, right lower leg, initial encounter I10 Essential (primary) hypertension Modifier: Quantity: Electronic Signature(s) Signed: 05/06/2019 3:28:07 PM By: Worthy Keeler PA-C Entered By: Worthy Keeler on 05/06/2019 15:28:07

## 2019-05-13 ENCOUNTER — Telehealth: Payer: Self-pay | Admitting: Internal Medicine

## 2019-05-13 ENCOUNTER — Encounter: Payer: Medicare HMO | Admitting: Physician Assistant

## 2019-05-13 ENCOUNTER — Other Ambulatory Visit: Payer: Self-pay

## 2019-05-13 DIAGNOSIS — I890 Lymphedema, not elsewhere classified: Secondary | ICD-10-CM | POA: Diagnosis not present

## 2019-05-13 DIAGNOSIS — G8929 Other chronic pain: Secondary | ICD-10-CM | POA: Diagnosis not present

## 2019-05-13 DIAGNOSIS — I10 Essential (primary) hypertension: Secondary | ICD-10-CM | POA: Diagnosis not present

## 2019-05-13 DIAGNOSIS — S81801A Unspecified open wound, right lower leg, initial encounter: Secondary | ICD-10-CM | POA: Diagnosis not present

## 2019-05-13 DIAGNOSIS — Z85038 Personal history of other malignant neoplasm of large intestine: Secondary | ICD-10-CM | POA: Diagnosis not present

## 2019-05-13 DIAGNOSIS — Z9049 Acquired absence of other specified parts of digestive tract: Secondary | ICD-10-CM | POA: Diagnosis not present

## 2019-05-13 NOTE — Telephone Encounter (Signed)
Pt said needs a refill on ondansetron for nausea. She said she likes to have this on hand. She is not having symptoms today. She wants it called into [facility].

## 2019-05-13 NOTE — Progress Notes (Addendum)
Katrina, [name] ([id number]) Visit Report for 05/13/2019 Chief Complaint Document Details Patient Name: [name], [name] Date of Service: 05/13/2019 2:45 PM Medical Record Number: [id number] Patient Account Number: [id number] Date of Birth/Sex: 10-20-1928 (86 y.o. F) Treating RN: Army Melia Primary Care Provider: [name] Other Clinician: Referring Provider: [name] Treating Provider/Extender: Melburn Hake, HOYT Weeks in Treatment: 3 Information Obtained from: Patient Chief Complaint Traumatic open wound right LE Electronic Signature(s) Signed: 05/13/2019 3:13:03 PM By: Worthy Keeler PA-C Entered By: Worthy Keeler on 05/13/2019 15:13:03 [name] ([id number]) -------------------------------------------------------------------------------- HPI Details Patient Name: [name] Date of Service: 05/13/2019 2:45 PM Medical Record Number: [id number] Patient Account Number: [id number] Date of Birth/Sex: 1928/12/17 (86 y.o. F) Treating RN: Army Melia Primary Care Provider: [name] Other Clinician: Referring Provider: [name] Treating Provider/Extender: Melburn Hake, HOYT Weeks in Treatment: 3 History of Present Illness HPI Description: 86 year old patient who had a blunt injury to her right lower extremity about a few weeks ago now noticed that the wound had opened out and started draining fluid. She is known to have chronic lower extremity edema which has been there for several years. The PCP feels that the edema has not related to anemia or CHF. The patient's past medical history significant for adenocarcinoma of the colon status post resection, bowel obstruction with adhesions, essential hypertension, vertigo,chronic back pain, status post cholecystectomy, hysterectomy, appendectomy, colon surgery, bowel adhesion resection, hemorrhoid surgery. She has never been a smoker. 06/18/2015 -- the patient was admitted to hospital between 06/11/2015 and  06/14/2015 with an acute ileitis, hypokalemia and accelerated hypertension. was put on empiric Cipro and Flagyl. She is doing fine at the present time. Readmission: 04/22/2019 patient presents today for readmission here in the clinic though she has not been seen since May 2017. Subsequently she had an injury that occurred 2 weeks ago when she had a traumatic wound when she hit her leg in the posterior aspect on the right. Patient notes that the injury occurred when after coming inside her walker actually fell over and struck her in the back of the leg. Since that time she has been having issues with some discomfort and drainage they had some leftover Santyl. EMS was called out to the house they put a dressing on it but did not pull the skin back down apparently. Unfortunately this means that she really has not healed as well as potentially she otherwise could have. There is no signs of active infection at this time. No fevers, chills, nausea, vomiting, or diarrhea. The patient does have a history of hypertension as well as lymphedema. Her ABIs were noncompressible at this point. No fevers, chills, nausea, vomiting, or diarrhea. 04/29/2019 upon evaluation today patient seems to be doing well with regard to her wounds in general at this point. She has been tolerating the dressing changes without complication. Fortunately there is no evidence of active infection at this point. Overall I think that the wound is appearing better and hopefully will continue to improve with the use of the collagen. 05/06/2019 upon evaluation today patient seems to making good progress with regard to her wounds at this time which is great news. Fortunately there is no signs of active infection. No fevers, chills, nausea, vomiting, or diarrhea. She has been tolerating the dressing changes without complication. Overall I feel like the wound is making great progress and is headed in the correct direction. 05/13/2019 upon evaluation  today patient appears to be doing very well in regard  to her leg ulcer. This is making good progress and I am very pleased with how things seem to be progressing. Overall she is happy with the fact that the wound does not appear to be worsening and in fact is making excellent progress toward complete closure. Her husband was also present and very pleased with what we were seeing. Electronic Signature(s) Signed: 05/13/2019 4:24:20 PM By: Worthy Keeler PA-C Entered By: Worthy Keeler on 05/13/2019 16:24:19 [name] ([id number]) -------------------------------------------------------------------------------- Physical Exam Details Patient Name: [name] Date of Service: 05/13/2019 2:45 PM Medical Record Number: [id number] Patient Account Number: [id number] Date of Birth/Sex: [birth year] (86 y.o. F) Treating RN: Army Melia Primary Care Provider: [name] Other Clinician: Referring Provider: [name] Treating Provider/Extender: Melburn Hake, HOYT Weeks in Treatment: 3 Constitutional Well-nourished and well-hydrated in no acute distress. Respiratory normal breathing without difficulty. Psychiatric this patient is able to make decisions and demonstrates good insight into disease process. Alert and Oriented x 3. pleasant and cooperative. Notes Upon inspection patient's wound bed actually showed signs of good granulation at this time. There was no evidence of significant slough buildup no need for sharp debridement today and post cleansing the wound with saline and gauze the patient's wound appears to be doing excellent at this point. Electronic Signature(s) Signed: 05/13/2019 4:24:34 PM By: Worthy Keeler PA-C Entered By: Worthy Keeler on 05/13/2019 16:24:34 [name] ([id number]) -------------------------------------------------------------------------------- Physician Orders Details Patient Name: [name] Date of Service: 05/13/2019 2:45 PM Medical  Record Number: [id number] Patient Account Number: [id number] Date of Birth/Sex: January 11, 1929 (86 y.o. F) Treating RN: Army Melia Primary Care Provider: [name] Other Clinician: Referring Provider: [name] Treating Provider/Extender: Melburn Hake, HOYT Weeks in Treatment: 3 Verbal / Phone Orders: No Diagnosis Coding ICD-10 Coding Code Description I89.0 Lymphedema, not elsewhere classified S81.801A Unspecified open wound, right lower leg, initial encounter I10 Essential (primary) hypertension Wound Cleansing Wound #3 Right,Posterior Lower Leg o Clean wound with Normal Saline. - in office o Cleanse wound with mild soap and water Primary Wound Dressing Wound #3 Right,Posterior Lower Leg o Silver Collagen Secondary Dressing Wound #3 Right,Posterior Lower Leg o Boardered Foam Dressing Dressing Change Frequency Wound #3 Right,Posterior Lower Leg o Change dressing every other day. Follow-up Appointments Wound #3 Right,Posterior Lower Leg o Return Appointment in 1 week. Electronic Signature(s) Signed: 05/13/2019 4:04:00 PM By: Army Melia Signed: 05/16/2019 5:29:46 PM By: Worthy Keeler PA-C Entered By: Army Melia on 05/13/2019 15:15:58 [name] ([id number]) -------------------------------------------------------------------------------- Problem List Details Patient Name: [name] Date of Service: 05/13/2019 2:45 PM Medical Record Number: [id number] Patient Account Number: [id number] Date of Birth/Sex: Apr 22, 1928 (86 y.o. F) Treating RN: Army Melia Primary Care Provider: [name] Other Clinician: Referring Provider: [name] Treating Provider/Extender: Melburn Hake, HOYT Weeks in Treatment: 3 Active Problems ICD-10 Evaluated Encounter Code Description Active Date Today Diagnosis I89.0 Lymphedema, not elsewhere classified 04/22/2019 No Yes S81.801A Unspecified open wound, right lower leg, initial encounter 04/22/2019 No Yes I10  Essential (primary) hypertension 04/22/2019 No Yes Inactive Problems Resolved Problems Electronic Signature(s) Signed: 05/13/2019 3:12:54 PM By: Worthy Keeler PA-C Entered By: Worthy Keeler on 05/13/2019 15:12:54 [name] ([id number]) -------------------------------------------------------------------------------- Progress Note Details Patient Name: [name] Date of Service: 05/13/2019 2:45 PM Medical Record Number: [id number] Patient Account Number: [id number] Date of Birth/Sex: [birth year] (86 y.o. F) Treating RN: Army Melia Primary Care Provider: [name] Other Clinician: Referring Provider: [name] Treating Provider/Extender: Melburn Hake,  HOYT Weeks in Treatment: 3 Subjective Chief Complaint Information obtained from Patient Traumatic open wound right LE History of Present Illness (HPI) 86 year old patient who had a blunt injury to her right lower extremity about a few weeks ago now noticed that the wound had opened out and started draining fluid. She is known to have chronic lower extremity edema which has been there for several years. The PCP feels that the edema has not related to anemia or CHF. The patient's past medical history significant for adenocarcinoma of the colon status post resection, bowel obstruction with adhesions, essential hypertension, vertigo,chronic back pain, status post cholecystectomy, hysterectomy, appendectomy, colon surgery, bowel adhesion resection, hemorrhoid surgery. She has never been a smoker. 06/18/2015 -- the patient was admitted to hospital between 06/11/2015 and 06/14/2015 with an acute ileitis, hypokalemia and accelerated hypertension. was put on empiric Cipro and Flagyl. She is doing fine at the present time. Readmission: 04/22/2019 patient presents today for readmission here in the clinic though she has not been seen since May 2017. Subsequently she had an injury that occurred 2 weeks ago when she had a traumatic  wound when she hit her leg in the posterior aspect on the right. Patient notes that the injury occurred when after coming inside her walker actually fell over and struck her in the back of the leg. Since that time she has been having issues with some discomfort and drainage they had some leftover Santyl. EMS was called out to the house they put a dressing on it but did not pull the skin back down apparently. Unfortunately this means that she really has not healed as well as potentially she otherwise could have. There is no signs of active infection at this time. No fevers, chills, nausea, vomiting, or diarrhea. The patient does have a history of hypertension as well as lymphedema. Her ABIs were noncompressible at this point. No fevers, chills, nausea, vomiting, or diarrhea. 04/29/2019 upon evaluation today patient seems to be doing well with regard to her wounds in general at this point. She has been tolerating the dressing changes without complication. Fortunately there is no evidence of active infection at this point. Overall I think that the wound is appearing better and hopefully will continue to improve with the use of the collagen. 05/06/2019 upon evaluation today patient seems to making good progress with regard to her wounds at this time which is great news. Fortunately there is no signs of active infection. No fevers, chills, nausea, vomiting, or diarrhea. She has been tolerating the dressing changes without complication. Overall I feel like the wound is making great progress and is headed in the correct direction. 05/13/2019 upon evaluation today patient appears to be doing very well in regard to her leg ulcer. This is making good progress and I am very pleased with how things seem to be progressing. Overall she is happy with the fact that the wound does not appear to be worsening and in fact is making excellent progress toward complete closure. Her husband was also present and very pleased with  what we were seeing. Objective Constitutional Well-nourished and well-hydrated in no acute distress. Vitals Time Taken: 2:40 PM, Height: 62 in, Weight: 145 lbs, BMI: 26.5, Temperature: 98.6 F, Pulse: 55 bpm, Respiratory Rate: 18 breaths/min, Blood Pressure: 133/42 mmHg. Respiratory normal breathing without difficulty. FARYN, CHRZANOWSKI ([id number]) Psychiatric this patient is able to make decisions and demonstrates good insight into disease process. Alert and Oriented x 3. pleasant and cooperative. General Notes: Upon inspection patient's  wound bed actually showed signs of good granulation at this time. There was no evidence of significant slough buildup no need for sharp debridement today and post cleansing the wound with saline and gauze the patient's wound appears to be doing excellent at this point. Integumentary (Hair, Skin) Wound #3 status is Open. Original cause of wound was Trauma. The wound is located on the Right,Posterior Lower Leg. The wound measures 0.7cm length x 1cm width x 0.1cm depth; 0.55cm^2 area and 0.055cm^3 volume. There is Fat Layer (Subcutaneous Tissue) Exposed exposed. There is no tunneling or undermining noted. There is a small amount of serous drainage noted. The wound margin is flat and intact. There is large (67-100%) granulation within the wound bed. There is a small (1-33%) amount of necrotic tissue within the wound bed including Adherent Slough. Assessment Active Problems ICD-10 Lymphedema, not elsewhere classified Unspecified open wound, right lower leg, initial encounter Essential (primary) hypertension Plan Wound Cleansing: Wound #3 Right,Posterior Lower Leg: Clean wound with Normal Saline. - in office Cleanse wound with mild soap and water Primary Wound Dressing: Wound #3 Right,Posterior Lower Leg: Silver Collagen Secondary Dressing: Wound #3 Right,Posterior Lower Leg: Boardered Foam Dressing Dressing Change Frequency: Wound #3 Right,Posterior  Lower Leg: Change dressing every other day. Follow-up Appointments: Wound #3 Right,Posterior Lower Leg: Return Appointment in 1 week. 1. I would recommend currently that we go ahead and continue with the current wound care measures specifically with regard to the silver collagen which seems to be doing well. 2. I am also can recommend that we continue with the bordered foam dressing which seems to be doing excellent for the patient. We will see patient back for reevaluation in 1 week here in the clinic. If anything worsens or changes patient will contact our office for additional recommendations. Electronic Signature(s) Signed: 05/13/2019 4:25:08 PM By: Worthy Keeler PA-C Entered By: Worthy Keeler on 05/13/2019 16:25:07 [name] ([id number]) -------------------------------------------------------------------------------- SuperBill Details Patient Name: [name] Date of Service: 05/13/2019 Medical Record Number: [id number] Patient Account Number: [id number] Date of Birth/Sex: January 27, 1929 (86 y.o. F) Treating RN: Army Melia Primary Care Provider: [name] Other Clinician: Referring Provider: [name] Treating Provider/Extender: Melburn Hake, HOYT Weeks in Treatment: 3 Diagnosis Coding ICD-10 Codes Code Description I89.0 Lymphedema, not elsewhere classified S81.801A Unspecified open wound, right lower leg, initial encounter [place] (primary) hypertension Facility Procedures CPT4 Code: [id number] Description: 99213 - WOUND CARE VISIT-LEV 3 EST PT Modifier: Quantity: 1 Physician Procedures CPT4 [id number] Description: [id number] - WC PHYS LEVEL 3 - EST PT Modifier: Quantity: 1 CPT4 Code: Description: ICD-10 Diagnosis Description I89.0 Lymphedema, not elsewhere classified S81.801A Unspecified open wound, right lower leg, initial encounter I10 Essential (primary) hypertension Modifier: Quantity: Electronic Signature(s) Signed: 05/13/2019 4:25:33 PM  By: Worthy Keeler PA-C Entered By: Worthy Keeler on 05/13/2019 16:25:32

## 2019-05-13 NOTE — Telephone Encounter (Signed)
Refill request for Zofran, last seen 04-16-19, last filled 2017.  Please advise.

## 2019-05-14 MED ORDER — ONDANSETRON HCL 4 MG PO TABS: 4.0000 mg | ORAL_TABLET | Freq: Two times a day (BID) | ORAL | 0 refills | Status: DC | PRN

## 2019-05-14 NOTE — Telephone Encounter (Signed)
rx ok'd for zofran

## 2019-05-15 NOTE — Progress Notes (Signed)
MINERVIA, BRILLA ([id number]) Visit Report for 05/13/2019 Arrival Information Details Patient Name: [name], [name] Date of Service: 05/13/2019 2:45 PM Medical Record Number: [id number] Patient Account Number: [id number] Date of Birth/Sex: [birth year] (86 y.o. F) Treating RN: Army Melia Primary Care Sione Baumgarten: [name] Other Clinician: Referring Pepper Kerrick: [name] Treating Velina Drollinger/Extender: Melburn Hake, HOYT Weeks in Treatment: 3 Visit Information History Since Last Visit Added or deleted any medications: No Patient Arrived: Walker Any new allergies or adverse reactions: No Arrival Time: 14:40 Had a fall or experienced change in No Accompanied By: husband activities of daily living that may affect Transfer Assistance: None risk of falls: Patient Identification Verified: Yes Signs or symptoms of abuse/neglect since last visito No Secondary Verification Process Completed: Yes Hospitalized since last visit: No Patient Requires Transmission-Based Precautions: No Implantable device outside of the clinic excluding No Patient Has Alerts: No cellular tissue based products placed in the center since last visit: Has Dressing in Place as Prescribed: Yes Pain Present Now: No Electronic Signature(s) Signed: 05/13/2019 4:19:28 PM By: Lorine Bears RCP, RRT, CHT Entered By: Lorine Bears on 05/13/2019 14:43:07 [name] ([id number]) -------------------------------------------------------------------------------- Clinic Level of Care Assessment Details Patient Name: [name] Date of Service: 05/13/2019 2:45 PM Medical Record Number: [id number] Patient Account Number: [id number] Date of Birth/Sex: September 08, 1928 (86 y.o. F) Treating RN: Army Melia Primary Care Jaymir Struble: [name] Other Clinician: Referring Little Winton: [name] Treating Carnell Beavers/Extender: Melburn Hake, HOYT Weeks in Treatment: 3 Clinic Level of Care Assessment  Items TOOL 4 Quantity Score []  - Use when only an EandM is performed on FOLLOW-UP visit 0 ASSESSMENTS - Nursing Assessment / Reassessment X - Reassessment of Co-morbidities (includes updates in patient status) 1 10 X- 1 5 Reassessment of Adherence to Treatment Plan ASSESSMENTS - Wound and Skin Assessment / Reassessment X - Simple Wound Assessment / Reassessment - one wound 1 5 []  - 0 Complex Wound Assessment / Reassessment - multiple wounds []  - 0 Dermatologic / Skin Assessment (not related to wound area) ASSESSMENTS - Focused Assessment []  - Circumferential Edema Measurements - multi extremities 0 []  - 0 Nutritional Assessment / Counseling / Intervention []  - 0 Lower Extremity Assessment (monofilament, tuning fork, pulses) []  - 0 Peripheral Arterial Disease Assessment (using hand held doppler) ASSESSMENTS - Ostomy and/or Continence Assessment and Care []  - Incontinence Assessment and Management 0 []  - 0 Ostomy Care Assessment and Management (repouching, etc.) PROCESS - Coordination of Care X - Simple Patient / Family Education for ongoing care 1 15 []  - 0 Complex (extensive) Patient / Family Education for ongoing care []  - 0 Staff obtains Programmer, systems, Records, Test Results / Process Orders []  - 0 Staff telephones HHA, Nursing Homes / Clarify orders / etc []  - 0 Routine Transfer to another Facility (non-emergent condition) []  - 0 Routine Hospital Admission (non-emergent condition) []  - 0 New Admissions / Biomedical engineer / Ordering NPWT, Apligraf, etc. []  - 0 Emergency Hospital Admission (emergent condition) X- 1 10 Simple Discharge Coordination []  - 0 Complex (extensive) Discharge Coordination PROCESS - Special Needs []  - Pediatric / Minor Patient Management 0 []  - 0 Isolation Patient Management []  - 0 Hearing / Language / Visual special needs []  - 0 Assessment of Community assistance (transportation, D/C planning, etc.) Lockner, Dawnetta N. ([id number]) []  -  0 Additional assistance / Altered mentation []  - 0 Support Surface(s) Assessment (bed, cushion, seat, etc.) INTERVENTIONS - Wound Cleansing / Measurement X - Simple Wound Cleansing - one wound 1 5 []  -  0 Complex Wound Cleansing - multiple wounds X- 1 5 Wound Imaging (photographs - any number of wounds) []  - 0 Wound Tracing (instead of photographs) X- 1 5 Simple Wound Measurement - one wound []  - 0 Complex Wound Measurement - multiple wounds INTERVENTIONS - Wound Dressings []  - Small Wound Dressing one or multiple wounds 0 X- 1 15 Medium Wound Dressing one or multiple wounds []  - 0 Large Wound Dressing one or multiple wounds []  - 0 Application of Medications - topical []  - 0 Application of Medications - injection INTERVENTIONS - Miscellaneous []  - External ear exam 0 []  - 0 Specimen Collection (cultures, biopsies, blood, body fluids, etc.) []  - 0 Specimen(s) / Culture(s) sent or taken to Lab for analysis []  - 0 Patient Transfer (multiple staff / Civil Service fast streamer / Similar devices) []  - 0 Simple Staple / Suture removal (25 or less) []  - 0 Complex Staple / Suture removal (26 or more) []  - 0 Hypo / Hyperglycemic Management (close monitor of Blood Glucose) []  - 0 Ankle / Brachial Index (ABI) - do not check if billed separately X- 1 5 Vital Signs Has the patient been seen at the hospital within the last three years: Yes Total Score: 80 Level Of Care: New/Established - Level 3 Electronic Signature(s) Signed: 05/13/2019 4:04:00 PM By: Army Melia Entered By: Army Melia on 05/13/2019 15:17:40 [name] ([id number]) -------------------------------------------------------------------------------- Encounter Discharge Information Details Patient Name: [name] Date of Service: 05/13/2019 2:45 PM Medical Record Number: [id number] Patient Account Number: [id number] Date of Birth/Sex: [birth year] (86 y.o. F) Treating RN: Army Melia Primary Care Michel Hendon: [name] Other Clinician: Referring Benett Swoyer: [name] Treating Darriel Utter/Extender: Melburn Hake, HOYT Weeks in Treatment: 3 Encounter Discharge Information Items Discharge Condition: Stable Ambulatory Status: Walker Discharge Destination: Home Transportation: Private Auto Accompanied By: husband Schedule Follow-up Appointment: Yes Clinical Summary of Care: Electronic Signature(s) Signed: 05/13/2019 4:04:00 PM By: Army Melia Entered By: Army Melia on 05/13/2019 15:18:18 [name] ([id number]) -------------------------------------------------------------------------------- Lower Extremity Assessment Details Patient Name: [name] Date of Service: 05/13/2019 2:45 PM Medical Record Number: [id number] Patient Account Number: [id number] Date of Birth/Sex: [birth year] (86 y.o. F) Treating RN: Cornell Barman Primary Care Dylann Layne: [name] Other Clinician: Referring Trevel Dillenbeck: [name] Treating Felicitas Sine/Extender: Melburn Hake, HOYT Weeks in Treatment: 3 Edema Assessment Assessed: [Left: No] [Right: No] Edema: [Left: Ye] [Right: s] Calf Left: Right: Point of Measurement: 30 cm From Medial Instep cm 34.2 cm Ankle Left: Right: Point of Measurement: 10 cm From Medial Instep cm 23.3 cm Vascular Assessment Pulses: Dorsalis Pedis Palpable: [Right:Yes] Electronic Signature(s) Signed: 05/15/2019 11:21:31 AM By: Gretta Cool, BSN, RN, CWS, Kim RN, BSN Entered By: Gretta Cool, BSN, RN, CWS, Kim on 05/13/2019 15:02:43 [name] ([id number]) -------------------------------------------------------------------------------- Multi Wound Chart Details Patient Name: [name] Date of Service: 05/13/2019 2:45 PM Medical Record Number: [id number] Patient Account Number: [id number] Date of Birth/Sex: January 15, 1929 (86 y.o. F) Treating RN: Army Melia Primary Care Pearley Baranek: [name] Other Clinician: Referring Lenni Reckner: [name] Treating Rogan Ecklund/Extender: Melburn Hake,  HOYT Weeks in Treatment: 3 Vital Signs Height(in): 62 Pulse(bpm): 55 Weight(lbs): 145 Blood Pressure(mmHg): 133/42 Body Mass Index(BMI): 27 Temperature(F): 98.6 Respiratory Rate(breaths/min): 18 Photos: [N/A:N/A] Wound Location: Right Lower Leg - Posterior N/A N/A Wounding Event: Trauma N/A N/A Primary Etiology: Trauma, Other N/A N/A Comorbid History: Anemia, Hypertension, N/A N/A Osteoarthritis, Received Chemotherapy Date Acquired: 04/08/2019 N/A N/A Weeks of Treatment: 3 N/A N/A Wound Status: Open N/A N/A Measurements L x W x D (  cm) 0.7x1x0.1 N/A N/A Area (cm) : 0.55 N/A N/A Volume (cm) : 0.055 N/A N/A % Reduction in Area: 89.60% N/A N/A % Reduction in Volume: 89.60% N/A N/A Classification: Full Thickness Without Exposed N/A N/A Support Structures Exudate Amount: Small N/A N/A Exudate Type: Serous N/A N/A Exudate Color: amber N/A N/A Wound Margin: Flat and Intact N/A N/A Granulation Amount: Large (67-100%) N/A N/A Necrotic Amount: Small (1-33%) N/A N/A Exposed Structures: Fat Layer (Subcutaneous Tissue) N/A N/A Exposed: Yes Fascia: No Tendon: No Muscle: No Joint: No Bone: No Epithelialization: Medium (34-66%) N/A N/A Treatment Notes Electronic Signature(s) Signed: 05/13/2019 4:04:00 PM By: Army Melia Entered By: Army Melia on 05/13/2019 15:15:40 [name] ([id number]) [name] ([id number]) -------------------------------------------------------------------------------- Multi-Disciplinary Care Plan Details Patient Name: [name] Date of Service: 05/13/2019 2:45 PM Medical Record Number: [id number] Patient Account Number: [id number] Date of Birth/Sex: 1928/08/16 (86 y.o. F) Treating RN: Army Melia Primary Care Keirstyn Aydt: [name] Other Clinician: Referring Mallissa Lorenzen: [name] Treating Chrisie Jankovich/Extender: Melburn Hake, HOYT Weeks in Treatment: 3 Active Inactive Abuse / Safety / Falls / Self Care Management Nursing  Diagnoses: History of Falls Goals: Patient/caregiver will verbalize understanding of the importance to maintain current immunizations/vaccinations Date Initiated: 04/22/2019 Target Resolution Date: 05/24/2019 Goal Status: Active Interventions: Provide education on fall prevention Notes: Orientation to the Wound Care Program Nursing Diagnoses: Knowledge deficit related to the wound healing center program Goals: Patient/caregiver will verbalize understanding of the [facility] Program Date Initiated: 04/22/2019 Target Resolution Date: 05/25/2019 Goal Status: Active Interventions: Provide education on orientation to the wound center Notes: Wound/Skin Impairment Nursing Diagnoses: Impaired tissue integrity Goals: Ulcer/skin breakdown will have a volume reduction of 30% by week 4 Date Initiated: 04/22/2019 Target Resolution Date: 05/24/2019 Goal Status: Active Interventions: Assess ulceration(s) every visit Notes: Electronic Signature(s) Signed: 05/13/2019 4:04:00 PM By: Army Melia Entered By: Army Melia on 05/13/2019 15:15:23 [name] ([id number] [name] ([id number]) -------------------------------------------------------------------------------- Pain Assessment Details Patient Name: [name] Date of Service: 05/13/2019 2:45 PM Medical Record Number: [id number] Patient Account Number: [id number] Date of Birth/Sex: 1928/08/18 (86 y.o. F) Treating RN: Cornell Barman Primary Care Tyan Dy: [name] Other Clinician: Referring Ranveer Wahlstrom: [name] Treating Natividad Schlosser/Extender: Melburn Hake, HOYT Weeks in Treatment: 3 Active Problems Location of Pain Severity and Description of Pain Patient Has Paino No Site Locations Pain Management and Medication Current Pain Management: Notes Patient denies pain at this time. Electronic Signature(s) Signed: 05/15/2019 11:21:31 AM By: Gretta Cool, BSN, RN, CWS, Kim RN, BSN Entered By: Gretta Cool, BSN, RN, CWS, Kim on  05/13/2019 14:55:23 [name] ([id number]) -------------------------------------------------------------------------------- Patient/Caregiver Education Details Patient Name: [name] Date of Service: 05/13/2019 2:45 PM Medical Record Number: [id number] Patient Account Number: [id number] Date of Birth/Gender: [birth year] (86 y.o. F) Treating RN: Army Melia Primary Care Physician: [name] Other Clinician: Referring Physician: [name] Treating Physician/Extender: Sharalyn Ink in Treatment: 3 Education Assessment Education Provided To: Patient Education Topics Provided Wound/Skin Impairment: Handouts: Caring for Your Ulcer Methods: Demonstration, Explain/Verbal Responses: State content correctly Electronic Signature(s) Signed: 05/13/2019 4:04:00 PM By: Army Melia Entered By: Army Melia on 05/13/2019 15:17:52 [name] ([id number]) -------------------------------------------------------------------------------- Wound Assessment Details Patient Name: [name] Date of Service: 05/13/2019 2:45 PM Medical Record Number: [id number] Patient Account Number: [id number] Date of Birth/Sex: 09/12/1928 (86 y.o. F) Treating RN: Cornell Barman Primary Care Sartaj Hoskin: [name] Other Clinician: Referring Adrien Shankar: [name] Treating Suzanne Kho/Extender: Melburn Hake, HOYT Weeks in Treatment: 3 Wound Status Wound  Number: 3 Primary Trauma, Other Etiology: Wound Location: Right Lower Leg - Posterior Wound Status: Open Wounding Event: Trauma Comorbid Anemia, Hypertension, Osteoarthritis, Received Date Acquired: 04/08/2019 History: Chemotherapy Weeks Of Treatment: 3 Clustered Wound: No Photos Wound Measurements Length: (cm) 0.7 Width: (cm) 1 Depth: (cm) 0.1 Area: (cm) 0.55 Volume: (cm) 0.055 % Reduction in Area: 89.6% % Reduction in Volume: 89.6% Epithelialization: Medium (34-66%) Tunneling: No Undermining: No Wound  Description Classification: Full Thickness Without Exposed Support Structu Wound Margin: Flat and Intact Exudate Amount: Small Exudate Type: Serous Exudate Color: amber res Foul Odor After Cleansing: No Slough/Fibrino No Wound Bed Granulation Amount: Large (67-100%) Exposed Structure Necrotic Amount: Small (1-33%) Fascia Exposed: No Necrotic Quality: Adherent Slough Fat Layer (Subcutaneous Tissue) Exposed: Yes Tendon Exposed: No Muscle Exposed: No Joint Exposed: No Bone Exposed: No Treatment Notes Wound #3 (Right, Posterior Lower Leg) Notes prisma, BFD Electronic Signature(s) Signed: 05/15/2019 11:21:31 AM By: Gretta Cool, BSN, RN, CWS, Kim RN, BSN [place], Kelsie [place] ([id number]) Entered By: Gretta Cool, BSN, RN, CWS, Kim on 05/13/2019 15:01:03 [name] ([id number]) -------------------------------------------------------------------------------- [place] Details Patient Name: [name] Date of Service: 05/13/2019 2:45 PM Medical Record Number: [id number] Patient Account Number: [id number] Date of Birth/Sex: 1928-09-15 (86 y.o. F) Treating RN: Army Melia Primary Care Amesha Bailey: [name] Other Clinician: Referring Homar Weinkauf: [name] Treating Marshayla Mitschke/Extender: Melburn Hake, HOYT Weeks in Treatment: 3 Vital Signs Time Taken: 14:40 Temperature (F): 98.6 Height (in): 62 Pulse (bpm): 55 Weight (lbs): 145 Respiratory Rate (breaths/min): 18 Body Mass Index (BMI): 26.5 Blood Pressure (mmHg): 133/42 Reference Range: 80 - 120 mg / dl Electronic Signature(s) Signed: 05/13/2019 4:19:28 PM By: Lorine Bears RCP, RRT, CHT Entered By: Lorine Bears on 05/13/2019 14:46:15

## 2019-05-20 ENCOUNTER — Other Ambulatory Visit: Payer: Self-pay

## 2019-05-20 ENCOUNTER — Encounter: Payer: Medicare HMO | Attending: Physician Assistant | Admitting: Physician Assistant

## 2019-05-20 DIAGNOSIS — Z9049 Acquired absence of other specified parts of digestive tract: Secondary | ICD-10-CM | POA: Diagnosis not present

## 2019-05-20 DIAGNOSIS — I10 Essential (primary) hypertension: Secondary | ICD-10-CM | POA: Diagnosis not present

## 2019-05-20 DIAGNOSIS — S81801A Unspecified open wound, right lower leg, initial encounter: Secondary | ICD-10-CM | POA: Insufficient documentation

## 2019-05-20 DIAGNOSIS — Z85038 Personal history of other malignant neoplasm of large intestine: Secondary | ICD-10-CM | POA: Diagnosis not present

## 2019-05-20 DIAGNOSIS — I890 Lymphedema, not elsewhere classified: Secondary | ICD-10-CM | POA: Diagnosis not present

## 2019-05-20 DIAGNOSIS — G8929 Other chronic pain: Secondary | ICD-10-CM | POA: Diagnosis not present

## 2019-05-20 NOTE — Progress Notes (Signed)
ADELL, BRACKLEY ([id number]) Visit Report for 05/20/2019 Arrival Information Details Patient Name: [name], [name] Date of Service: 05/20/2019 2:45 PM Medical Record Number: [id number] Patient Account Number: [id number] Date of Birth/Sex: May 14, 1928 (86 y.o. F) Treating RN: Army Melia Primary Care Eulla Kochanowski: [name] Other Clinician: Referring Kenlyn Lose: [name] Treating Xiara Knisley/Extender: Melburn Hake, HOYT Weeks in Treatment: 4 Visit Information History Since Last Visit Added or deleted any medications: No Patient Arrived: Walker Any new allergies or adverse reactions: No Arrival Time: 14:43 Had a fall or experienced change in No Accompanied By: husband activities of daily living that may affect Transfer Assistance: None risk of falls: Patient Identification Verified: Yes Signs or symptoms of abuse/neglect since last visito No Secondary Verification Process Completed: Yes Hospitalized since last visit: No Patient Requires Transmission-Based Precautions: No Implantable device outside of the clinic excluding No Patient Has Alerts: No cellular tissue based products placed in the center since last visit: Has Dressing in Place as Prescribed: Yes Pain Present Now: No Electronic Signature(s) Signed: 05/20/2019 3:57:19 PM By: Lorine Bears RCP, RRT, CHT Entered By: Lorine Bears on 05/20/2019 14:44:55 [name] ([id number]) -------------------------------------------------------------------------------- Clinic Level of Care Assessment Details Patient Name: [name] Date of Service: 05/20/2019 2:45 PM Medical Record Number: [id number] Patient Account Number: [id number] Date of Birth/Sex: [birth year] (86 y.o. F) Treating RN: Army Melia Primary Care Aamya Orellana: [name] Other Clinician: Referring Londyn Hotard: [name] Treating Darnella Zeiter/Extender: Melburn Hake, HOYT Weeks in Treatment: 4 Clinic Level of Care Assessment Items TOOL  4 Quantity Score []  - Use when only an EandM is performed on FOLLOW-UP visit 0 ASSESSMENTS - Nursing Assessment / Reassessment X - Reassessment of Co-morbidities (includes updates in patient status) 1 10 X- 1 5 Reassessment of Adherence to Treatment Plan ASSESSMENTS - Wound and Skin Assessment / Reassessment X - Simple Wound Assessment / Reassessment - one wound 1 5 []  - 0 Complex Wound Assessment / Reassessment - multiple wounds []  - 0 Dermatologic / Skin Assessment (not related to wound area) ASSESSMENTS - Focused Assessment []  - Circumferential Edema Measurements - multi extremities 0 []  - 0 Nutritional Assessment / Counseling / Intervention X- 1 5 Lower Extremity Assessment (monofilament, tuning fork, pulses) []  - 0 Peripheral Arterial Disease Assessment (using hand held doppler) ASSESSMENTS - Ostomy and/or Continence Assessment and Care []  - Incontinence Assessment and Management 0 []  - 0 Ostomy Care Assessment and Management (repouching, etc.) PROCESS - Coordination of Care X - Simple Patient / Family Education for ongoing care 1 15 []  - 0 Complex (extensive) Patient / Family Education for ongoing care X- 1 10 Staff obtains Programmer, systems, Records, Test Results / Process Orders []  - 0 Staff telephones HHA, Nursing Homes / Clarify orders / etc []  - 0 Routine Transfer to another Facility (non-emergent condition) []  - 0 Routine Hospital Admission (non-emergent condition) []  - 0 New Admissions / Biomedical engineer / Ordering NPWT, Apligraf, etc. []  - 0 Emergency Hospital Admission (emergent condition) X- 1 10 Simple Discharge Coordination []  - 0 Complex (extensive) Discharge Coordination PROCESS - Special Needs []  - Pediatric / Minor Patient Management 0 []  - 0 Isolation Patient Management []  - 0 Hearing / Language / Visual special needs []  - 0 Assessment of Community assistance (transportation, D/C planning, etc.) [name], [name] ([id number]) []  - 0 Additional  assistance / Altered mentation []  - 0 Support Surface(s) Assessment (bed, cushion, seat, etc.) INTERVENTIONS - Wound Cleansing / Measurement X - Simple Wound Cleansing - one wound 1 5 []  -  0 Complex Wound Cleansing - multiple wounds X- 1 5 Wound Imaging (photographs - any number of wounds) []  - 0 Wound Tracing (instead of photographs) X- 1 5 Simple Wound Measurement - one wound []  - 0 Complex Wound Measurement - multiple wounds INTERVENTIONS - Wound Dressings []  - Small Wound Dressing one or multiple wounds 0 []  - 0 Medium Wound Dressing one or multiple wounds []  - 0 Large Wound Dressing one or multiple wounds []  - 0 Application of Medications - topical []  - 0 Application of Medications - injection INTERVENTIONS - Miscellaneous []  - External ear exam 0 []  - 0 Specimen Collection (cultures, biopsies, blood, body fluids, etc.) []  - 0 Specimen(s) / Culture(s) sent or taken to Lab for analysis []  - 0 Patient Transfer (multiple staff / Civil Service fast streamer / Similar devices) []  - 0 Simple Staple / Suture removal (25 or less) []  - 0 Complex Staple / Suture removal (26 or more) []  - 0 Hypo / Hyperglycemic Management (close monitor of Blood Glucose) []  - 0 Ankle / Brachial Index (ABI) - do not check if billed separately X- 1 5 Vital Signs Has the patient been seen at the hospital within the last three years: Yes Total Score: 80 Level Of Care: New/Established - Level 3 Electronic Signature(s) Signed: 05/20/2019 3:08:20 PM By: Army Melia Entered By: Army Melia on 05/20/2019 14:55:33 [name] ([id number]) -------------------------------------------------------------------------------- Encounter Discharge Information Details Patient Name: [name] Date of Service: 05/20/2019 2:45 PM Medical Record Number: [id number] Patient Account Number: [id number] Date of Birth/Sex: [birth year] (86 y.o. F) Treating RN: Army Melia Primary Care Danene Montijo: [name] Other  Clinician: Referring Janalyn Higby: [name] Treating Biridiana Twardowski/Extender: Melburn Hake, HOYT Weeks in Treatment: 4 Encounter Discharge Information Items Discharge Condition: Stable Ambulatory Status: Ambulatory Discharge Destination: Home Transportation: Private Auto Accompanied By: spouse Schedule Follow-up Appointment: Yes Clinical Summary of Care: Electronic Signature(s) Signed: 05/20/2019 3:08:20 PM By: Army Melia Entered By: Army Melia on 05/20/2019 14:56:45 [name] ([id number]) -------------------------------------------------------------------------------- Lower Extremity Assessment Details Patient Name: [name] Date of Service: 05/20/2019 2:45 PM Medical Record Number: [id number] Patient Account Number: [id number] Date of Birth/Sex: 1928-11-15 (86 y.o. F) Treating RN: Army Melia Primary Care Galilee Pierron: [name] Other Clinician: Referring Brandun Pinn: [name] Treating Hezakiah Champeau/Extender: STONE III, HOYT Weeks in Treatment: 4 Edema Assessment Assessed: [Left: No] [Right: No] Edema: [Left: N] [Right: o] Vascular Assessment Pulses: Dorsalis Pedis Palpable: [Right:Yes] Electronic Signature(s) Signed: 05/20/2019 3:08:20 PM By: Army Melia Entered By: Army Melia on 05/20/2019 14:52:15 [name] ([id number]) -------------------------------------------------------------------------------- Multi Wound Chart Details Patient Name: [name] Date of Service: 05/20/2019 2:45 PM Medical Record Number: [id number] Patient Account Number: [id number] Date of Birth/Sex: [birth year] (86 y.o. F) Treating RN: Army Melia Primary Care Anwitha Mapes: [name] Other Clinician: Referring Aizza Santiago: [name] Treating Shamra Bradeen/Extender: Melburn Hake, HOYT Weeks in Treatment: 4 Vital Signs Height(in): 62 Pulse(bpm): 58 Weight(lbs): 145 Blood Pressure(mmHg): 145/41 Body Mass Index(BMI): 27 Temperature(F): 98.8 Respiratory Rate(breaths/min):  18 Photos: [N/A:N/A] Wound Location: Right, Posterior Lower Leg N/A N/A Wounding Event: Trauma N/A N/A Primary Etiology: Trauma, Other N/A N/A Comorbid History: Anemia, Hypertension, N/A N/A Osteoarthritis, Received Chemotherapy Date Acquired: 04/08/2019 N/A N/A Weeks of Treatment: 4 N/A N/A Wound Status: Open N/A N/A Measurements L x W x D (cm) 0.1x0.1x0.1 N/A N/A Area (cm) : 0.008 N/A N/A Volume (cm) : 0.001 N/A N/A % Reduction in Area: 99.80% N/A N/A % Reduction in Volume: 99.80% N/A N/A Classification: Full Thickness Without Exposed N/A N/A  Support Structures Exudate Amount: None Present N/A N/A Wound Margin: Flat and Intact N/A N/A Granulation Amount: None Present (0%) N/A N/A Necrotic Amount: None Present (0%) N/A N/A Exposed Structures: Fascia: No N/A N/A Fat Layer (Subcutaneous Tissue) Exposed: No Tendon: No Muscle: No Joint: No Bone: No Epithelialization: Large (67-100%) N/A N/A Treatment Notes Electronic Signature(s) Signed: 05/20/2019 3:08:20 PM By: Army Melia Entered By: Army Melia on 05/20/2019 14:52:25 [name] ([id number]) -------------------------------------------------------------------------------- [place] Details Patient Name: [name] Date of Service: 05/20/2019 2:45 PM Medical Record Number: [id number] Patient Account Number: [id number] Date of Birth/Sex: [birth year] (86 y.o. F) Treating RN: Army Melia Primary Care Estes Lehner: [name] Other Clinician: Referring Jasim Harari: [name] Treating Haeli Gerlich/Extender: Melburn Hake, HOYT Weeks in Treatment: 4 Active Inactive Electronic Signature(s) Signed: 05/20/2019 3:08:20 PM By: Army Melia Entered By: Army Melia on 05/20/2019 14:54:37 [name] ([id number]) -------------------------------------------------------------------------------- Pain Assessment Details Patient Name: [name] Date of Service: 05/20/2019 2:45 PM Medical Record Number:  [id number] Patient Account Number: [id number] Date of Birth/Sex: 14-Feb-1929 (86 y.o. F) Treating RN: Army Melia Primary Care Verlyn Dannenberg: [name] Other Clinician: Referring Stevee Valenta: [name] Treating Airyonna Franklyn/Extender: Melburn Hake, HOYT Weeks in Treatment: 4 Active Problems Location of Pain Severity and Description of Pain Patient Has Paino No Site Locations Pain Management and Medication Current Pain Management: Electronic Signature(s) Signed: 05/20/2019 3:08:20 PM By: Army Melia Entered By: Army Melia on 05/20/2019 14:51:36 [name] ([id number]) -------------------------------------------------------------------------------- Patient/Caregiver Education Details Patient Name: [name] Date of Service: 05/20/2019 2:45 PM Medical Record Number: [id number] Patient Account Number: [id number] Date of Birth/Gender: 01-19-1929 (86 y.o. F) Treating RN: Army Melia Primary Care Physician: [name] Other Clinician: Referring Physician: [name] Treating Physician/Extender: Sharalyn Ink in Treatment: 4 Education Assessment Education Provided To: Patient Education Topics Provided Wound/Skin Impairment: Handouts: Caring for Your Ulcer Methods: Demonstration, Explain/Verbal Responses: State content correctly Electronic Signature(s) Signed: 05/20/2019 3:08:20 PM By: Army Melia Entered By: Army Melia on 05/20/2019 14:56:30 [name] ([id number]) -------------------------------------------------------------------------------- Wound Assessment Details Patient Name: [name] Date of Service: 05/20/2019 2:45 PM Medical Record Number: [id number] Patient Account Number: [id number] Date of Birth/Sex: 02-24-1928 (86 y.o. F) Treating RN: Army Melia Primary Care Vasco Chong: [name] Other Clinician: Referring Scherry Laverne: [name] Treating Maleke Feria/Extender: Melburn Hake, HOYT Weeks in Treatment: 4 Wound Status Wound Number:  3 Primary Trauma, Other Etiology: Wound Location: Right, Posterior Lower Leg Wound Status: Healed - Epithelialized Wounding Event: Trauma Comorbid Anemia, Hypertension, Osteoarthritis, Received Date Acquired: 04/08/2019 History: Chemotherapy Weeks Of Treatment: 4 Clustered Wound: No Photos Wound Measurements Length: (cm) Width: (cm) Depth: (cm) Area: (cm) Volume: (cm) 0 % Reduction in Area: 100% 0 % Reduction in Volume: 100% 0 Epithelialization: Large (67-100%) 0 Tunneling: No 0 Undermining: No Wound Description Classification: Full Thickness Without Exposed Support Structures Wound Margin: Flat and Intact Exudate Amount: None Present Foul Odor After Cleansing: No Slough/Fibrino No Wound Bed Granulation Amount: None Present (0%) Exposed Structure Necrotic Amount: None Present (0%) Fascia Exposed: No Fat Layer (Subcutaneous Tissue) Exposed: No Tendon Exposed: No Muscle Exposed: No Joint Exposed: No Bone Exposed: No Electronic Signature(s) Signed: 05/20/2019 3:08:20 PM By: Army Melia Entered By: Army Melia on 05/20/2019 14:54:27 [name] ([id number]) -------------------------------------------------------------------------------- [place] Details Patient Name: [name] Date of Service: 05/20/2019 2:45 PM Medical Record Number: [id number] Patient Account Number: [id number] Date of Birth/Sex: [birth year] (86 y.o. F) Treating RN: Army Melia Primary Care Cynia Abruzzo: [name] Other Clinician: Referring Ziare Orrick:  SCOTT, CHARLENE Treating Irfan Veal/Extender: STONE III, HOYT Weeks in Treatment: 4 Vital Signs Time Taken: 14:45 Temperature (F): 98.8 Height (in): 62 Pulse (bpm): 58 Weight (lbs): 145 Respiratory Rate (breaths/min): 18 Body Mass Index (BMI): 26.5 Blood Pressure (mmHg): 145/41 Reference Range: 80 - 120 mg / dl Electronic Signature(s) Signed: 05/20/2019 3:57:19 PM By: Lorine Bears RCP, RRT, CHT Entered By: Lorine Bears on 05/20/2019 14:46:21

## 2019-05-20 NOTE — Progress Notes (Addendum)
[name] ([id number]) Visit Report for 05/20/2019 Chief Complaint Document Details Patient Name: [name] Date of Service: 05/20/2019 2:45 PM Medical Record Number: [id number] Patient Account Number: [id number] Date of Birth/Sex: 1928-09-13 (86 y.o. F) Treating RN: Army Melia Primary Care Provider: [name] Other Clinician: Referring Provider: [name] Treating Provider/Extender: Melburn Hake, Angelette Ganus Weeks in Treatment: 4 Information Obtained from: Patient Chief Complaint Traumatic open wound right LE Electronic Signature(s) Signed: 05/20/2019 2:57:42 PM By: Worthy Keeler PA-C Entered By: Worthy Keeler on 05/20/2019 14:57:41 [name] ([id number]) -------------------------------------------------------------------------------- HPI Details Patient Name: [name] Date of Service: 05/20/2019 2:45 PM Medical Record Number: [id number] Patient Account Number: [id number] Date of Birth/Sex: [birth year] (86 y.o. F) Treating RN: Army Melia Primary Care Provider: [name] Other Clinician: Referring Provider: [name] Treating Provider/Extender: Melburn Hake, Shaunita Seney Weeks in Treatment: 4 History of Present Illness HPI Description: 86 year old patient who had a blunt injury to her right lower extremity about a few weeks ago now noticed that the wound had opened out and started draining fluid. She is known to have chronic lower extremity edema which has been there for several years. The PCP feels that the edema has not related to anemia or CHF. The patient's past medical history significant for adenocarcinoma of the colon status post resection, bowel obstruction with adhesions, essential hypertension, vertigo,chronic back pain, status post cholecystectomy, hysterectomy, appendectomy, colon surgery, bowel adhesion resection, hemorrhoid surgery. She has never been a smoker. 06/18/2015 -- the patient was admitted to hospital between 06/11/2015 and  06/14/2015 with an acute ileitis, hypokalemia and accelerated hypertension. was put on empiric Cipro and Flagyl. She is doing fine at the present time. Readmission: 04/22/2019 patient presents today for readmission here in the clinic though she has not been seen since May 2017. Subsequently she had an injury that occurred 2 weeks ago when she had a traumatic wound when she hit her leg in the posterior aspect on the right. Patient notes that the injury occurred when after coming inside her walker actually fell over and struck her in the back of the leg. Since that time she has been having issues with some discomfort and drainage they had some leftover Santyl. EMS was called out to the house they put a dressing on it but did not pull the skin back down apparently. Unfortunately this means that she really has not healed as well as potentially she otherwise could have. There is no signs of active infection at this time. No fevers, chills, nausea, vomiting, or diarrhea. The patient does have a history of hypertension as well as lymphedema. Her ABIs were noncompressible at this point. No fevers, chills, nausea, vomiting, or diarrhea. 04/29/2019 upon evaluation today patient seems to be doing well with regard to her wounds in general at this point. She has been tolerating the dressing changes without complication. Fortunately there is no evidence of active infection at this point. Overall I think that the wound is appearing better and hopefully will continue to improve with the use of the collagen. 05/06/2019 upon evaluation today patient seems to making good progress with regard to her wounds at this time which is great news. Fortunately there is no signs of active infection. No fevers, chills, nausea, vomiting, or diarrhea. She has been tolerating the dressing changes without complication. Overall I feel like the wound is making great progress and is headed in the correct direction. 05/13/2019 upon evaluation  today patient appears to be doing very well in regard  to her leg ulcer. This is making good progress and I am very pleased with how things seem to be progressing. Overall she is happy with the fact that the wound does not appear to be worsening and in fact is making excellent progress toward complete closure. Her husband was also present and very pleased with what we were seeing. 05/20/2019 upon evaluation today patient appears to be doing excellent in regard to her wounds at this point. Fortunately there is no signs of active infection at this time which is great news. No fever chills noted. In fact she appears to be completely healed which is great news. Electronic Signature(s) Signed: 05/20/2019 3:06:36 PM By: Worthy Keeler PA-C Entered By: Worthy Keeler on 05/20/2019 15:06:36 [name] ([id number]) -------------------------------------------------------------------------------- Physical Exam Details Patient Name: [name] Date of Service: 05/20/2019 2:45 PM Medical Record Number: [id number] Patient Account Number: [id number] Date of Birth/Sex: [birth year] (86 y.o. F) Treating RN: Army Melia Primary Care Provider: [name] Other Clinician: Referring Provider: [name] Treating Provider/Extender: STONE III, Jomo Forand Weeks in Treatment: 4 Constitutional Well-nourished and well-hydrated in no acute distress. Respiratory normal breathing without difficulty. Psychiatric this patient is able to make decisions and demonstrates good insight into disease process. Alert and Oriented x 3. pleasant and cooperative. Notes Patient's wound bed again showed signs of complete epithelization there is no evidence of active infection and overall she seems to be doing quite well and very pleased in this regard. The patient is continuing to wear her compression stockings. Electronic Signature(s) Signed: 05/20/2019 3:06:57 PM By: Worthy Keeler PA-C Entered By: Worthy Keeler on  05/20/2019 15:06:56 [name] ([id number]) -------------------------------------------------------------------------------- Physician Orders Details Patient Name: [name] Date of Service: 05/20/2019 2:45 PM Medical Record Number: [id number] Patient Account Number: [id number] Date of Birth/Sex: [birth year] (86 y.o. F) Treating RN: Army Melia Primary Care Provider: [name] Other Clinician: Referring Provider: [name] Treating Provider/Extender: Melburn Hake, Dajon Rowe Weeks in Treatment: 4 Verbal / Phone Orders: No Diagnosis Coding ICD-10 Coding Code Description I89.0 Lymphedema, not elsewhere classified S81.801A Unspecified open wound, right lower leg, initial encounter I10 Essential (primary) hypertension Discharge From [facility] o Discharge from [place] complete Electronic Signature(s) Signed: 05/20/2019 3:08:20 PM By: Army Melia Signed: 05/24/2019 10:31:16 AM By: Worthy Keeler PA-C Entered By: Army Melia on 05/20/2019 14:54:57 [name] ([id number]) -------------------------------------------------------------------------------- Problem List Details Patient Name: [name] Date of Service: 05/20/2019 2:45 PM Medical Record Number: [id number] Patient Account Number: [id number] Date of Birth/Sex: 1928-07-24 (86 y.o. F) Treating RN: Army Melia Primary Care Provider: [name] Other Clinician: Referring Provider: [name] Treating Provider/Extender: Melburn Hake, Kinser Fellman Weeks in Treatment: 4 Active Problems ICD-10 Evaluated Encounter Code Description Active Date Today Diagnosis I89.0 Lymphedema, not elsewhere classified 04/22/2019 No Yes S81.801A Unspecified open wound, right lower leg, initial encounter 04/22/2019 No Yes I10 Essential (primary) hypertension 04/22/2019 No Yes Inactive Problems Resolved Problems Electronic Signature(s) Signed: 05/20/2019 2:52:58 PM By: Worthy Keeler PA-C Entered By: Worthy Keeler on 05/20/2019 14:52:58 [name] ([id number]) -------------------------------------------------------------------------------- Progress Note Details Patient Name: [name] Date of Service: 05/20/2019 2:45 PM Medical Record Number: [id number] Patient Account Number: [id number] Date of Birth/Sex: 11-Oct-1928 (86 y.o. F) Treating RN: Army Melia Primary Care Provider: [name] Other Clinician: Referring Provider: [name] Treating Provider/Extender: Melburn Hake, Keshav Winegar Weeks in Treatment: 4 Subjective Chief Complaint Information obtained from Patient Traumatic open wound right LE History of  Present Illness (HPI) 86 year old patient who had a blunt injury to her right lower extremity about a few weeks ago now noticed that the wound had opened out and started draining fluid. She is known to have chronic lower extremity edema which has been there for several years. The PCP feels that the edema has not related to anemia or CHF. The patient's past medical history significant for adenocarcinoma of the colon status post resection, bowel obstruction with adhesions, essential hypertension, vertigo,chronic back pain, status post cholecystectomy, hysterectomy, appendectomy, colon surgery, bowel adhesion resection, hemorrhoid surgery. She has never been a smoker. 06/18/2015 -- the patient was admitted to hospital between 06/11/2015 and 06/14/2015 with an acute ileitis, hypokalemia and accelerated hypertension. was put on empiric Cipro and Flagyl. She is doing fine at the present time. Readmission: 04/22/2019 patient presents today for readmission here in the clinic though she has not been seen since May 2017. Subsequently she had an injury that occurred 2 weeks ago when she had a traumatic wound when she hit her leg in the posterior aspect on the right. Patient notes that the injury occurred when after coming inside her walker actually fell over and struck her in the back of the  leg. Since that time she has been having issues with some discomfort and drainage they had some leftover Santyl. EMS was called out to the house they put a dressing on it but did not pull the skin back down apparently. Unfortunately this means that she really has not healed as well as potentially she otherwise could have. There is no signs of active infection at this time. No fevers, chills, nausea, vomiting, or diarrhea. The patient does have a history of hypertension as well as lymphedema. Her ABIs were noncompressible at this point. No fevers, chills, nausea, vomiting, or diarrhea. 04/29/2019 upon evaluation today patient seems to be doing well with regard to her wounds in general at this point. She has been tolerating the dressing changes without complication. Fortunately there is no evidence of active infection at this point. Overall I think that the wound is appearing better and hopefully will continue to improve with the use of the collagen. 05/06/2019 upon evaluation today patient seems to making good progress with regard to her wounds at this time which is great news. Fortunately there is no signs of active infection. No fevers, chills, nausea, vomiting, or diarrhea. She has been tolerating the dressing changes without complication. Overall I feel like the wound is making great progress and is headed in the correct direction. 05/13/2019 upon evaluation today patient appears to be doing very well in regard to her leg ulcer. This is making good progress and I am very pleased with how things seem to be progressing. Overall she is happy with the fact that the wound does not appear to be worsening and in fact is making excellent progress toward complete closure. Her husband was also present and very pleased with what we were seeing. 05/20/2019 upon evaluation today patient appears to be doing excellent in regard to her wounds at this point. Fortunately there is no signs of active infection at this time  which is great news. No fever chills noted. In fact she appears to be completely healed which is great news. Objective Constitutional Well-nourished and well-hydrated in no acute distress. Vitals Time Taken: 2:45 PM, Height: 62 in, Weight: 145 lbs, BMI: 26.5, Temperature: 98.8 F, Pulse: 58 bpm, Respiratory Rate: 18 breaths/min, Blood Pressure: 145/41 mmHg. MARRA, HIRKO N. ([id number]) Respiratory normal breathing  without difficulty. Psychiatric this patient is able to make decisions and demonstrates good insight into disease process. Alert and Oriented x 3. pleasant and cooperative. General Notes: Patient's wound bed again showed signs of complete epithelization there is no evidence of active infection and overall she seems to be doing quite well and very pleased in this regard. The patient is continuing to wear her compression stockings. Integumentary (Hair, Skin) Wound #3 status is Healed - Epithelialized. Original cause of wound was Trauma. The wound is located on the Right,Posterior Lower Leg. The wound measures 0cm length x 0cm width x 0cm depth; 0cm^2 area and 0cm^3 volume. There is no tunneling or undermining noted. There is a none present amount of drainage noted. The wound margin is flat and intact. There is no granulation within the wound bed. There is no necrotic tissue within the wound bed. Assessment Active Problems ICD-10 Lymphedema, not elsewhere classified Unspecified open wound, right lower leg, initial encounter Essential (primary) hypertension Plan Discharge From [facility] Services: Discharge from [place] complete 1. My suggestion at this time is can be that we continue with the use of her own compression stockings. We will subsequently at this point discontinue wound care services as she is completely healed the wound is completely epithelialized. 2. Also recommend she continue to elevate her legs as much as possible to try to prevent edema and  subsequently open ulcers. We will see patient back for reevaluation in 1 week here in the clinic. If anything worsens or changes patient will contact our office for additional recommendations. Electronic Signature(s) Signed: 05/20/2019 3:07:27 PM By: Worthy Keeler PA-C Entered By: Worthy Keeler on 05/20/2019 15:07:26 [name] ([id number]) -------------------------------------------------------------------------------- SuperBill Details Patient Name: [name] Date of Service: 05/20/2019 Medical Record Number: [id number] Patient Account Number: [id number] Date of Birth/Sex: 07/09/1928 (86 y.o. F) Treating RN: Army Melia Primary Care Provider: [name] Other Clinician: Referring Provider: [name] Treating Provider/Extender: Melburn Hake, Devetta Hagenow Weeks in Treatment: 4 Diagnosis Coding ICD-10 Codes Code Description I89.0 Lymphedema, not elsewhere classified S81.801A Unspecified open wound, right lower leg, initial encounter [place] (primary) hypertension Facility Procedures CPT4 Code: [id number] Description: 99213 - WOUND CARE VISIT-LEV 3 EST PT Modifier: Quantity: 1 Physician Procedures CPT4 [id number] Description: [id number] - WC PHYS LEVEL 3 - EST PT Modifier: Quantity: 1 CPT4 Code: Description: ICD-10 Diagnosis Description I89.0 Lymphedema, not elsewhere classified S81.801A Unspecified open wound, right lower leg, initial encounter I10 Essential (primary) hypertension Modifier: Quantity: Electronic Signature(s) Signed: 05/20/2019 3:07:38 PM By: Worthy Keeler PA-C Entered By: Worthy Keeler on 05/20/2019 15:07:38

## 2019-05-27 ENCOUNTER — Encounter: Payer: Medicare HMO | Admitting: Physician Assistant

## 2019-05-28 ENCOUNTER — Telehealth: Payer: Self-pay | Admitting: Internal Medicine

## 2019-05-28 NOTE — Telephone Encounter (Signed)
Need to know how blood pressures are doing.  Can schedule an appt if having persistent problems.  Elevate legs, avoid increased salt, etc.

## 2019-05-28 NOTE — Telephone Encounter (Signed)
Need to know how her blood pressures are doing.  Also confirm what medication and doses she is currently taking.  Any other symptoms or signs of infection.

## 2019-05-28 NOTE — Telephone Encounter (Signed)
Spoken to patient, all medications are the same except norvasc. She is currently taking 5mg  of norvasc. She did have a wound on her ankle but she was discharged from wound care and that is healed and does not afftect patient. She is NOT having SOB,blurry vision, fever, pain in either leg, and erythema in either leg. She just has swelling and she is concerned. SX started this past Sunday after norvasc rx was changed per pt. Pt stated she has no other sx.

## 2019-05-28 NOTE — Telephone Encounter (Signed)
Current blood pressure readings:  130/77 151/88 145/41 129/74 127/73  Advised of message below.

## 2019-05-28 NOTE — Telephone Encounter (Signed)
Pt called and said that her legs are swelling and are starting to get blisters  Pt said that BP medication was reduced  Pt wanted to know if she needed to go back to the full dose

## 2019-05-28 NOTE — Telephone Encounter (Signed)
Please call and get more info

## 2019-05-29 NOTE — Telephone Encounter (Signed)
I have scheduled an appointment for Monday at 2 to discuss leg swelling and medication patient is taking 150 mg of Avapro and 5 mg of amlodipine Updating chart  and patient very pleased with seeing PCP on [place].

## 2019-05-29 NOTE — Telephone Encounter (Signed)
Per phone note she is taking 5mg  of amlodipine.  (per medication review) - 10mg  amlodipine on her list.  Need to confirm if she is cutting in 1/2 and if so, please make changes on medication list.  Also need to confirm if on 150mg  of avapro.  If only taking 5mg  of amlodipine and having increased swelling, I would hold on increasing amlodipine back up to 10mg .  Amlodipine can contribute to lower extremity swelling.  Overall pressures appear to be doing ok.  We can adjust avapro if needed.  Can schedule a f/u appt to evaluate and discuss if needed.

## 2019-05-30 ENCOUNTER — Ambulatory Visit (INDEPENDENT_AMBULATORY_CARE_PROVIDER_SITE_OTHER)
Admission: RE | Admit: 2019-05-30 | Discharge: 2019-05-30 | Disposition: A | Discharge status: Home / Self Care | Payer: Medicare HMO | Source: Ambulatory Visit

## 2019-05-30 ENCOUNTER — Other Ambulatory Visit: Payer: Self-pay

## 2019-05-30 DIAGNOSIS — R600 Localized edema: Secondary | ICD-10-CM | POA: Diagnosis not present

## 2019-05-30 MED ORDER — FUROSEMIDE 20 MG PO TABS: 20.0000 mg | ORAL_TABLET | Freq: Every day | ORAL | 0 refills | Status: DC

## 2019-05-30 NOTE — ED Provider Notes (Signed)
Virtual Visit via Video Note:  [name]  initiated request for Telemedicine visit with [facility] team. I connected with [name]  on 05/30/2019 at 10:29 AM  for a synchronized telemedicine visit using a video enabled HIPPA compliant telemedicine application. I verified that I am speaking with [name]  using two identifiers. Brayten Komar Jodell Cipro, PA-C  was physically located in a [facility] Urgent care site and [name] was located at a different location.   The limitations of evaluation and management by telemedicine as well as the availability of in-person appointments were discussed. Patient was informed that she  may incur a bill ( including co-pay) for this virtual visit encounter. [name]  expressed understanding and gave verbal consent to proceed with virtual visit.     History of Present Illness:[name]  is a 86 y.o. female presents with 1.5 week swelling bilaterally, R>L. Started noticing some redness 1 week ago that is not spreading. Denies pain to the area. 2 days ago, noted small blisters to the right lower leg. Denies fever, chills, body aches. Denies cough, shortness of breath, chest pain, orthopnea. Has been taking BP medications as directed including irbesartan 150mg  daily, amlodipine 5mg  daily, metoprolol 25mg  daily. BP has been stable in the 120-140s/70-80s. No weakness, dizziness, syncope. Has been trying compression socks, elevation without relief. Has PCP appt 06/03/2019  H/p Colon cancer s/p resection and chemotherapy, in remission.  Past Medical History:  Diagnosis Date  . Anemia   . Barrett's esophagus   . Bowel obstruction (HCC)    s/p adhesion resection  . Chronic back pain   . Colon cancer ([place]) 1988 and 1989   adenocarcinoma, s/p resection x  2 and chemo  . Diverticulitis   . GERD (gastroesophageal reflux disease)   . H/O open leg wound   . Hiatal hernia   . Hypercholesteremia   . Hypertension   . Lumbar scoliosis   . OA  (osteoarthritis)   . Peripheral neuropathy   . Recurrent sinus infections   . Renal cyst   . S/P chemotherapy, time since greater than 12 weeks    colon cancer  . Vertigo     Allergies  Allergen Reactions  . Fentanyl Other (See Comments)    confusion        Observations/Objective: General: Well appearing, nontoxic, no acute distress. Sitting comfortably. Head: Normocephalic, atraumatic Eye: No conjunctival injection, eyelid swelling. EOMI ENT: Mucus membranes moist, no lip cracking. No obvious nasal drainage. Pulm: Speaking in full sentences without difficulty. Normal effort. No respiratory distress, accessory muscle use. MSK: bilateral leg swelling, hard to determine size from video. Slight erythema with daughter stating mild warmth. Bilateral pitting edema, 3+ to the knee. No open wounds. No tenderness.  Neuro: Normal mental status. Alert and oriented x 3.   Assessment and Plan: Low suspicion for cellulitis at this time. Will start lasix 20mg  daily for the next 3-4 days. Discussed with patient and daughter, given age, worries for hypotension/weakness/lightheadedness with lasix use, to monitor closely. Can decrease to lasix 10mg  if needed. Decrease salt and continue elevation. Strict return precautions given. Otherwise, follow up with PCP as scheduled for further evaluation and management needed. Patient and daughter expresses understanding and agrees to plan.  Follow Up Instructions:    I discussed the assessment and treatment plan with the patient. The patient was provided an opportunity to ask questions and all were answered. The patient agreed with the  plan and demonstrated an understanding of the instructions.   The patient was advised to call back or seek an in-person evaluation if the symptoms worsen or if the condition fails to improve as anticipated.  I provided 16 minutes of non-face-to-face time during this encounter.    Ok Edwards, PA-C  05/30/2019 10:29 AM          Ok Edwards, PA-C 05/30/19 1521

## 2019-05-30 NOTE — Discharge Instructions (Addendum)
No signs of skin infection at this time. Start lasix 20mg  daily, and monitor blood pressure. If blood pressure lowering, can try lasix 10mg  instead. If experiencing dizziness, weakness, lightheadedness, please stop medicine. If noticing spreading redness, pain, follow up for in person evaluation. If noticing chest pain, shortness of breath, unable to lay flat at night, go to the emergency department for further evaluation needed. Otherwise, follow up with your PCP as scheduled for reevaluation needed.  [facility] at [facility]) [place], [place], [place] [address] [phone]  [facility] at [facility] [address] [place], [place] [address] [phone]  [facility] at [facility] [address] [place], [place] [address] [phone]  [facility] at [place], [place], [place], [place] [address] [phone]  [facility] at [place] [address] [place], [place] [address] [phone]  [facility] at [address], [place], [place] [address] [phone]

## 2019-06-02 ENCOUNTER — Other Ambulatory Visit: Payer: Self-pay | Admitting: Internal Medicine

## 2019-06-03 ENCOUNTER — Other Ambulatory Visit: Payer: Self-pay

## 2019-06-03 ENCOUNTER — Ambulatory Visit (INDEPENDENT_AMBULATORY_CARE_PROVIDER_SITE_OTHER): Payer: Medicare HMO | Admitting: Internal Medicine

## 2019-06-03 ENCOUNTER — Encounter: Payer: Self-pay | Admitting: Internal Medicine

## 2019-06-03 DIAGNOSIS — D509 Iron deficiency anemia, unspecified: Secondary | ICD-10-CM | POA: Diagnosis not present

## 2019-06-03 DIAGNOSIS — E7800 Pure hypercholesterolemia, unspecified: Secondary | ICD-10-CM

## 2019-06-03 DIAGNOSIS — M545 Low back pain, unspecified: Secondary | ICD-10-CM

## 2019-06-03 DIAGNOSIS — R600 Localized edema: Secondary | ICD-10-CM

## 2019-06-03 DIAGNOSIS — K22719 Barrett's esophagus with dysplasia, unspecified: Secondary | ICD-10-CM | POA: Diagnosis not present

## 2019-06-03 DIAGNOSIS — R531 Weakness: Secondary | ICD-10-CM | POA: Diagnosis not present

## 2019-06-03 DIAGNOSIS — G8929 Other chronic pain: Secondary | ICD-10-CM

## 2019-06-03 DIAGNOSIS — I10 Essential (primary) hypertension: Secondary | ICD-10-CM

## 2019-06-03 MED ORDER — IRBESARTAN 300 MG PO TABS: 300.0000 mg | ORAL_TABLET | Freq: Every day | ORAL | 1 refills | Status: DC

## 2019-06-03 NOTE — Progress Notes (Addendum)
Patient ID: [name], female   DOB: 13-May-1928, 86 y.o.   MRN: [id number]   Subjective:    Patient ID: [name], female    DOB: September 05, 1928, 86 y.o.   MRN: [id number]  HPI This visit occurred during the SARS-CoV-2 public health emergency.  Safety protocols were in place, including screening questions prior to the visit, additional usage of staff PPE, and extensive cleaning of exam room while observing appropriate contact time as indicated for disinfecting solutions.  Patient here as a work in appt with concerns regarding leg swelling. She is accompanied by her husband.  History obtained from both of them.   Was seen at Urgent Care 05/30/19 with concerns regarding 1.5 weeks of swelling bilaterally - right > left.  Was started on lasix.  Advised to decreased salt intake and continue leg elevation.  She took lasix one day.  Increased urine output.  Has not taken since.  Leg swelling - improved.  She has been sitting in a recliner with legs elevated.  No chest pain.  Breathing stable.  No increased cough or congestion.  No acid reflux reported.  No abdominal pain.  Bowels stable.  Has a hard time getting up from sitting.  Request lift chair.    Past Medical History:  Diagnosis Date  . Anemia   . Barrett's esophagus   . Bowel obstruction (HCC)    s/p adhesion resection  . Chronic back pain   . Colon cancer ([place]) 1988 and 1989   adenocarcinoma, s/p resection x  2 and chemo  . Diverticulitis   . GERD (gastroesophageal reflux disease)   . H/O open leg wound   . Hiatal hernia   . Hypercholesteremia   . Hypertension   . Lumbar scoliosis   . OA (osteoarthritis)   . Peripheral neuropathy   . Recurrent sinus infections   . Renal cyst   . S/P chemotherapy, time since greater than 12 weeks    colon cancer  . Vertigo    Past Surgical History:  Procedure Laterality Date  . ABDOMINAL HYSTERECTOMY  1982  . adhesions resected     bowel obstruction  . APPENDECTOMY    . BREAST BIOPSY Left      negative 06/14/1985  . CHOLECYSTECTOMY  1994  . COLON RESECTION     x2.  s/p colon cancer  . [place]  . EXCISIONAL HEMORRHOIDECTOMY     with tubal ligation  . EXPLORATORY LAPAROTOMY  1991   Secondary to SBO   Family History  Problem Relation Age of Onset  . Breast cancer Mother   . Asthma Mother   . Stroke Father   . Hypertension Father   . Diabetes Father   . Ovarian cancer Sister        x2  . Prostate cancer Brother   . Hypertension Brother        x3  . Heart disease Brother   . Hypercholesterolemia Brother        x3  . Diabetes Brother   . Spina bifida Grandchild   . Hematuria Neg Hx   . Kidney cancer Neg Hx   . Kidney disease Neg Hx   . Sickle cell trait Neg Hx   . Tuberculosis Neg Hx    Social History   Socioeconomic History  . Marital status: Married    Spouse name: Not on file  . Number of children: 4  . Years of education:  12th grade  . Highest education level: Not on file  Occupational History  . Occupation: homemaker  Tobacco Use  . Smoking status: Never Smoker  . Smokeless tobacco: Never Used  Substance and Sexual Activity  . Alcohol use: No    Alcohol/week: 0.0 standard drinks  . Drug use: No  . Sexual activity: Never  Other Topics Concern  . Not on file  Social History Narrative  . Not on file   Social Determinants of Health   Financial Resource Strain:   . Difficulty of Paying Living Expenses:   Food Insecurity:   . Worried About Charity fundraiser in the Last Year:   . Arboriculturist in the Last Year:   Transportation Needs:   . Film/video editor (Medical):   Marland Kitchen Lack of Transportation (Non-Medical):   Physical Activity:   . Days of Exercise per Week:   . Minutes of Exercise per Session:   Stress:   . Feeling of Stress :   Social Connections:   . Frequency of Communication with Friends and Family:   . Frequency of Social Gatherings with Friends and Family:   . Attends Religious  Services:   . Active Member of Clubs or Organizations:   . Attends Archivist Meetings:   Marland Kitchen Marital Status:     Outpatient Encounter Medications as of 06/03/2019  Medication Sig  . albuterol (VENTOLIN HFA) 108 (90 Base) MCG/ACT inhaler TAKE 2 PUFFS BY MOUTH EVERY 6 HOURS AS NEEDED FOR WHEEZE OR SHORTNESS OF BREATH  . amLODipine (NORVASC) 10 MG tablet Take 5 mg by mouth daily.   . fexofenadine (ALLEGRA) 60 MG tablet TAKE 1 TABLET EVERY DAY  . furosemide (LASIX) 20 MG tablet Take 1 tablet (20 mg total) by mouth daily.  Marland Kitchen gabapentin (NEURONTIN) 100 MG capsule Take two capsules q hs  . irbesartan (AVAPRO) 300 MG tablet Take 1 tablet (300 mg total) by mouth daily.  . meloxicam (MOBIC) 7.5 MG tablet   . metoCLOPramide (REGLAN) 5 MG tablet Take 5 mg by mouth 2 (two) times daily.  . metoprolol succinate (TOPROL-XL) 25 MG 24 hr tablet TAKE 1 TABLET EVERY DAY  . ondansetron (ZOFRAN) 4 MG tablet Take 1 tablet (4 mg total) by mouth 2 (two) times daily as needed for nausea or vomiting.  Marland Kitchen oxyCODONE (OXY IR/ROXICODONE) 5 MG immediate release tablet Take 5 mg by mouth 3 (three) times daily.   . pantoprazole (PROTONIX) 40 MG tablet Take 40 mg by mouth 2 (two) times daily.  . simvastatin (ZOCOR) 10 MG tablet Take 1 tablet (10 mg total) by mouth at bedtime.  . solifenacin (VESICARE) 5 MG tablet Take 1 tablet (5 mg total) by mouth daily.  . traMADol-acetaminophen (ULTRACET) 37.5-325 MG per tablet Take 1 tablet by mouth 2 (two) times daily as needed.   . [DISCONTINUED] irbesartan (AVAPRO) 150 MG tablet Take 1 tablet (150 mg total) by mouth daily.   No facility-administered encounter medications on file as of 06/03/2019.    Review of Systems  Constitutional: Negative for appetite change and unexpected weight change.  HENT: Negative for congestion and sinus pressure.   Respiratory: Negative for cough, chest tightness and shortness of breath.   Cardiovascular: Negative for chest pain and  palpitations.       Leg swelling improved.    Gastrointestinal: Negative for abdominal pain, diarrhea, nausea and vomiting.  Genitourinary: Negative for difficulty urinating and dysuria.  Musculoskeletal: Negative for joint swelling and myalgias.  Skin: Negative for color change and rash.  Neurological: Negative for dizziness, light-headedness and headaches.  Psychiatric/Behavioral: Negative for agitation and dysphoric mood.       Objective:    Physical Exam Constitutional:      General: She is not in acute distress.    Appearance: Normal appearance.  HENT:     Head: Normocephalic and atraumatic.     Right Ear: External ear normal.     Left Ear: External ear normal.  Eyes:     General:        Right eye: No discharge.        Left eye: No discharge.     Conjunctiva/sclera: Conjunctivae normal.  Neck:     Thyroid: No thyromegaly.  Cardiovascular:     Rate and Rhythm: Normal rate and regular rhythm.  Pulmonary:     Effort: No respiratory distress.     Breath sounds: Normal breath sounds. No wheezing.  Abdominal:     General: Bowel sounds are normal.     Palpations: Abdomen is soft.     Tenderness: There is no abdominal tenderness.  Musculoskeletal:        General: No tenderness.     Cervical back: Neck supple. No tenderness.     Comments: DP pulses palpable and equal bilaterally.  Decreased swelling.  Some stasis changes.   Lymphadenopathy:     Cervical: No cervical adenopathy.  Skin:    Findings: No erythema or rash.  Neurological:     Mental Status: She is alert.  Psychiatric:        Mood and Affect: Mood normal.        Behavior: Behavior normal.     BP (!) 158/70   Pulse 65   Temp (!) 96.9 F (36.1 C)   Resp 16   Ht 5\' 2"  (1.575 m)   Wt 152 lb 12.8 oz (69.3 kg)   SpO2 97%   BMI 27.95 kg/m  Wt Readings from Last 3 Encounters:  06/03/19 152 lb 12.8 oz (69.3 kg)  04/15/19 146 lb 12.8 oz (66.6 kg)  02/28/19 145 lb (65.8 kg)     Lab Results  Component  Value Date   WBC 6.8 04/15/2019   HGB 13.1 04/15/2019   HCT 39.1 04/15/2019   PLT 261.0 04/15/2019   GLUCOSE 88 04/15/2019   CHOL 149 04/15/2019   TRIG 164.0 (H) 04/15/2019   HDL 54.50 04/15/2019   LDLCALC 62 04/15/2019   ALT 10 04/15/2019   AST 15 04/15/2019   NA 139 04/15/2019   K 4.4 04/15/2019   CL 103 04/15/2019   CREATININE 0.82 04/15/2019   BUN 16 04/15/2019   CO2 26 04/15/2019   TSH 2.36 12/03/2018   INR 0.91 06/11/2015   HGBA1C 5.5 04/15/2019       Assessment & Plan:   Problem List Items Addressed This Visit    Anemia, iron deficiency    Follow cbc.        Barrett's esophagus    Acid controlled.  Followed by GI.       Bilateral lower extremity edema    Increased recently.  Took lasix x 1.  Has had legs elevated.  Swelling much improved.  Follow.        Chronic back pain    Followed by Dr Sharlet Salina.  S/p ESI.        Hereditary hemochromatosis ([place])    Was evaluated by hematology.  Has declined further f/u with hematology.  Hypercholesteremia    On simvastatin.  Low cholesterol diet and exercise.  Follow lipid panel and liver function tests.        Relevant Medications   irbesartan (AVAPRO) 300 MG tablet   Hypertension    Blood pressure elevated.  Continue 5mg  amlodipine.  Increase avapro to 300mg  q day.  Follow pressures.  Follow metabolic panel.       Relevant Medications   irbesartan (AVAPRO) 300 MG tablet   Weakness    Weakness.  Has trouble getting up from seated position.  Needs to get up and move around multiple times daily.  Prescription for lift chair.            [name], MD

## 2019-06-06 ENCOUNTER — Telehealth: Payer: Self-pay | Admitting: Internal Medicine

## 2019-06-06 DIAGNOSIS — R531 Weakness: Secondary | ICD-10-CM

## 2019-06-06 NOTE — Telephone Encounter (Signed)
Yes - DME for lift chair.  Thanks

## 2019-06-06 NOTE — Telephone Encounter (Signed)
Do you want me to order a lift chair for her? I wasn't sure if this was discussed at her last visit.

## 2019-06-06 NOTE — Telephone Encounter (Signed)
Pt would like to know if Dr Nicki Reaper is able to get an order placed for a lift chair. Please advise

## 2019-06-07 NOTE — Telephone Encounter (Signed)
Signed order.

## 2019-06-07 NOTE — Addendum Note (Signed)
Addended by: Lars Masson on: 06/07/2019 04:17 PM   Modules accepted: Orders

## 2019-06-07 NOTE — Telephone Encounter (Signed)
DME printed and placed in quick sign for signature.

## 2019-06-09 ENCOUNTER — Encounter: Payer: Self-pay | Admitting: Internal Medicine

## 2019-06-09 DIAGNOSIS — R531 Weakness: Secondary | ICD-10-CM | POA: Insufficient documentation

## 2019-06-09 NOTE — Assessment & Plan Note (Signed)
Weakness.  Has trouble getting up from seated position.  Needs to get up and move around multiple times daily.  Prescription for lift chair.

## 2019-06-09 NOTE — Assessment & Plan Note (Signed)
Was evaluated by hematology.  Has declined further f/u with hematology.

## 2019-06-09 NOTE — Assessment & Plan Note (Signed)
Followed by Dr Sharlet Salina.  S/p ESI.

## 2019-06-09 NOTE — Assessment & Plan Note (Signed)
On simvastatin.  Low cholesterol diet and exercise.  Follow lipid panel and liver function tests.   

## 2019-06-09 NOTE — Assessment & Plan Note (Signed)
Acid controlled.  Followed by GI.

## 2019-06-09 NOTE — Assessment & Plan Note (Signed)
Increased recently.  Took lasix x 1.  Has had legs elevated.  Swelling much improved.  Follow.

## 2019-06-09 NOTE — Assessment & Plan Note (Signed)
Follow cbc.  

## 2019-06-09 NOTE — Assessment & Plan Note (Signed)
Blood pressure elevated.  Continue 5mg  amlodipine.  Increase avapro to 300mg  q day.  Follow pressures.  Follow metabolic panel.

## 2019-06-11 ENCOUNTER — Ambulatory Visit: Payer: Medicare HMO | Admitting: Pulmonary Disease

## 2019-06-11 ENCOUNTER — Encounter: Payer: Self-pay | Admitting: Pulmonary Disease

## 2019-06-11 ENCOUNTER — Other Ambulatory Visit: Payer: Self-pay

## 2019-06-11 ENCOUNTER — Telehealth: Payer: Self-pay | Admitting: Internal Medicine

## 2019-06-11 VITALS — BP 130/78 | HR 60 | Ht 62.0 in | Wt 153.6 lb

## 2019-06-11 DIAGNOSIS — R062 Wheezing: Secondary | ICD-10-CM | POA: Diagnosis not present

## 2019-06-11 DIAGNOSIS — K449 Diaphragmatic hernia without obstruction or gangrene: Secondary | ICD-10-CM | POA: Diagnosis not present

## 2019-06-11 DIAGNOSIS — K219 Gastro-esophageal reflux disease without esophagitis: Secondary | ICD-10-CM

## 2019-06-11 NOTE — Patient Instructions (Signed)
I think your "wheezing" is due to irritation of the vocal cords from reflux due to your hiatal hernia which is quite sizable.  You should consider making an appointment with your gastroenterologist to evaluate this you may need to have a scope to evaluate the severity of your hernia.  We will schedule breathing tests to make sure there is nothing wrong with the lungs.  We will see you in follow-up after this is done.

## 2019-06-11 NOTE — Telephone Encounter (Signed)
error 

## 2019-06-11 NOTE — Telephone Encounter (Signed)
DME faxed to [facility]

## 2019-06-12 ENCOUNTER — Telehealth: Payer: Self-pay | Admitting: Pulmonary Disease

## 2019-06-12 NOTE — Telephone Encounter (Signed)
Pt called this morning and requested that PFT and COVID test to be canceled. Pt stated that she just didn't want to go through this at this time.  Message taken as Juluis Rainier for physician and I contacted Cardiopulmonary and canceled PFT. Message sent to Grayland Jack to cancel COVID test. Catha Gosselin

## 2019-06-12 NOTE — Telephone Encounter (Signed)
Noted and that is quite all right.

## 2019-06-19 ENCOUNTER — Ambulatory Visit: Payer: Medicare HMO | Admitting: Dermatology

## 2019-06-19 ENCOUNTER — Other Ambulatory Visit: Payer: Self-pay

## 2019-06-19 DIAGNOSIS — L821 Other seborrheic keratosis: Secondary | ICD-10-CM | POA: Diagnosis not present

## 2019-06-19 DIAGNOSIS — L820 Inflamed seborrheic keratosis: Secondary | ICD-10-CM | POA: Diagnosis not present

## 2019-06-19 DIAGNOSIS — Z85828 Personal history of other malignant neoplasm of skin: Secondary | ICD-10-CM | POA: Diagnosis not present

## 2019-06-19 DIAGNOSIS — L578 Other skin changes due to chronic exposure to nonionizing radiation: Secondary | ICD-10-CM

## 2019-06-19 NOTE — Progress Notes (Signed)
   Follow-Up Visit   Subjective  [name] is a 86 y.o. female who presents for the following: Other (Spots of scalp and back that are flaky ). The spots are symptomatic and she would like them treated.  She also has history of skin cancer on the scalp that she would like checked.   The following portions of the chart were reviewed this encounter and updated as appropriate:  Tobacco  Allergies  Meds  Problems  Med Hx  Surg Hx  Fam Hx      Review of Systems:  No other skin or systemic complaints except as noted in HPI or Assessment and Plan.  Objective  Well appearing patient in no apparent distress; mood and affect are within normal limits.  A focused examination was performed including face, scalp, back. Relevant physical exam findings are noted in the Assessment and Plan.  Objective  Left Shoulder x 1, left back x 1 (2): Erythematous keratotic or waxy stuck-on papule or plaque.    Assessment & Plan    Actinic Damage - diffuse scaly erythematous macules with underlying dyspigmentation - Recommend daily broad spectrum sunscreen SPF 30+ to sun-exposed areas, reapply every 2 hours as needed.  - Call for new or changing lesions.  Seborrheic Keratoses - Stuck-on, waxy, tan-brown papules and plaques  - Discussed benign etiology and prognosis. - Observe - Call for any changes   History of basal cell carcinoma (BCC) Scalp Clear. Observe for recurrence. Call clinic for new or changing lesions.  Recommend regular skin exams, daily broad-spectrum spf 30+ sunscreen use, and photoprotection.    Inflamed seborrheic keratosis (2) Left Shoulder x 1, left back x 1  Destruction of lesion - Left Shoulder x 1, left back x 1 Complexity: simple   Destruction method: cryotherapy   Informed consent: discussed and consent obtained   Timeout:  patient name, date of birth, surgical site, and procedure verified Lesion destroyed using liquid nitrogen: Yes   Region frozen until ice  ball extended beyond lesion: Yes   Outcome: patient tolerated procedure well with no complications   Post-procedure details: wound care instructions given    Return in about 1 year (around 06/18/2020).  I, Ashok Cordia, CMA, am acting as scribe for Sarina Ser, MD .  Documentation: I have reviewed the above documentation for accuracy and completeness, and I agree with the above.  Sarina Ser, MD

## 2019-06-19 NOTE — Patient Instructions (Signed)
Wound Care Instructions  1. Cleanse wound gently with soap and water once a day then pat dry with clean gauze. Apply a thing coat of Petrolatum (petroleum jelly, "Vaseline") over the wound (unless you have an allergy to this). We recommend that you use a new, sterile tube of Vaseline. Do not pick or remove scabs. Do not remove the yellow or white "healing tissue" from the base of the wound.  2. Cover the wound with fresh, clean, nonstick gauze and secure with paper tape. You may use Band-Aids in place of gauze and tape if the would is small enough, but would recommend trimming much of the tape off as there is often too much. Sometimes Band-Aids can irritate the skin.  3. You should call the office for your biopsy report after 1 week if you have not already been contacted.  4. If you experience any problems, such as abnormal amounts of bleeding, swelling, significant bruising, significant pain, or evidence of infection, please call the office immediately.  5. FOR ADULT SURGERY PATIENTS: If you need something for pain relief you may take 1 extra strength Tylenol (acetaminophen) AND 2 Ibuprofen (200mg each) together every 4 hours as needed for pain. (do not take these if you are allergic to them or if you have a reason you should not take them.) Typically, you may only need pain medication for 1 to 3 days.   Wound Care Instructions  6. Cleanse wound gently with soap and water once a day then pat dry with clean gauze. Apply a thing coat of Petrolatum (petroleum jelly, "Vaseline") over the wound (unless you have an allergy to this). We recommend that you use a new, sterile tube of Vaseline. Do not pick or remove scabs. Do not remove the yellow or white "healing tissue" from the base of the wound.  7. Cover the wound with fresh, clean, nonstick gauze and secure with paper tape. You may use Band-Aids in place of gauze and tape if the would is small enough, but would recommend trimming much of the tape off  as there is often too much. Sometimes Band-Aids can irritate the skin.  8. You should call the office for your biopsy report after 1 week if you have not already been contacted.  9. If you experience any problems, such as abnormal amounts of bleeding, swelling, significant bruising, significant pain, or evidence of infection, please call the office immediately.  10. FOR ADULT SURGERY PATIENTS: If you need something for pain relief you may take 1 extra strength Tylenol (acetaminophen) AND 2 Ibuprofen (200mg each) together every 4 hours as needed for pain. (do not take these if you are allergic to them or if you have a reason you should not take them.) Typically, you may only need pain medication for 1 to 3 days.      

## 2019-06-20 ENCOUNTER — Other Ambulatory Visit: Payer: Medicare HMO

## 2019-06-21 ENCOUNTER — Ambulatory Visit: Payer: Medicare HMO

## 2019-06-22 ENCOUNTER — Encounter: Payer: Self-pay | Admitting: Dermatology

## 2019-06-24 DIAGNOSIS — M7062 Trochanteric bursitis, left hip: Secondary | ICD-10-CM | POA: Diagnosis not present

## 2019-06-24 DIAGNOSIS — M5136 Other intervertebral disc degeneration, lumbar region: Secondary | ICD-10-CM | POA: Diagnosis not present

## 2019-06-24 DIAGNOSIS — M48062 Spinal stenosis, lumbar region with neurogenic claudication: Secondary | ICD-10-CM | POA: Diagnosis not present

## 2019-06-24 DIAGNOSIS — M5416 Radiculopathy, lumbar region: Secondary | ICD-10-CM | POA: Diagnosis not present

## 2019-06-24 DIAGNOSIS — M62830 Muscle spasm of back: Secondary | ICD-10-CM | POA: Diagnosis not present

## 2019-06-25 ENCOUNTER — Ambulatory Visit (INDEPENDENT_AMBULATORY_CARE_PROVIDER_SITE_OTHER): Payer: Medicare HMO | Admitting: Internal Medicine

## 2019-06-25 ENCOUNTER — Encounter: Payer: Self-pay | Admitting: Internal Medicine

## 2019-06-25 ENCOUNTER — Other Ambulatory Visit: Payer: Self-pay

## 2019-06-25 DIAGNOSIS — E7800 Pure hypercholesterolemia, unspecified: Secondary | ICD-10-CM

## 2019-06-25 DIAGNOSIS — M545 Low back pain, unspecified: Secondary | ICD-10-CM

## 2019-06-25 DIAGNOSIS — M7989 Other specified soft tissue disorders: Secondary | ICD-10-CM

## 2019-06-25 DIAGNOSIS — G8929 Other chronic pain: Secondary | ICD-10-CM | POA: Diagnosis not present

## 2019-06-25 DIAGNOSIS — I10 Essential (primary) hypertension: Secondary | ICD-10-CM | POA: Diagnosis not present

## 2019-06-25 NOTE — Progress Notes (Signed)
Patient ID: [name], female   DOB: 12/02/1928, 86 y.o.   MRN: [id number]   Subjective:    Patient ID: [name], female    DOB: [birth year], 86 y.o.   MRN: [id number]  HPI This visit occurred during the SARS-CoV-2 public health emergency.  Safety protocols were in place, including screening questions prior to the visit, additional usage of staff PPE, and extensive cleaning of exam room while observing appropriate contact time as indicated for disinfecting solutions.  Patient here for a scheduled follow up.  She is accompanied by her husband.  History obtained from both of them.  Reports she is doing relatively well.  No chest pain or sob.  Eating.  No abdominal pain.  Bowels moving.  Eating.  Sees Dr Sharlet Salina for pain management.  Blood pressures doing better.  Reviewed outside readings - averaging - 130-140s/70-80s.  Request spacer to help with inhalers.    Past Medical History:  Diagnosis Date  . Anemia   . Barrett's esophagus   . Bowel obstruction (HCC)    s/p adhesion resection  . Chronic back pain   . Colon cancer ([place]) 1988 and 1989   adenocarcinoma, s/p resection x  2 and chemo  . Diverticulitis   . GERD (gastroesophageal reflux disease)   . H/O open leg wound   . Hiatal hernia   . Hypercholesteremia   . Hypertension   . Lumbar scoliosis   . OA (osteoarthritis)   . Peripheral neuropathy   . Recurrent sinus infections   . Renal cyst   . S/P chemotherapy, time since greater than 12 weeks    colon cancer  . Vertigo    Past Surgical History:  Procedure Laterality Date  . ABDOMINAL HYSTERECTOMY  1982  . adhesions resected     bowel obstruction  . APPENDECTOMY    . BREAST BIOPSY Left    negative 06/14/1985  . CHOLECYSTECTOMY  1994  . COLON RESECTION     x2.  s/p colon cancer  . [place]  . EXCISIONAL HEMORRHOIDECTOMY     with tubal ligation  . EXPLORATORY LAPAROTOMY  1991   Secondary to SBO   Family History  Problem  Relation Age of Onset  . Breast cancer Mother   . Asthma Mother   . Stroke Father   . Hypertension Father   . Diabetes Father   . Ovarian cancer Sister        x2  . Prostate cancer Brother   . Hypertension Brother        x3  . Heart disease Brother   . Hypercholesterolemia Brother        x3  . Diabetes Brother   . Spina bifida Grandchild   . Hematuria Neg Hx   . Kidney cancer Neg Hx   . Kidney disease Neg Hx   . Sickle cell trait Neg Hx   . Tuberculosis Neg Hx    Social History   Socioeconomic History  . Marital status: Married    Spouse name: Not on file  . Number of children: 4  . Years of education: 12th grade  . Highest education level: Not on file  Occupational History  . Occupation: homemaker  Tobacco Use  . Smoking status: Never Smoker  . Smokeless tobacco: Never Used  Substance and Sexual Activity  . Alcohol use: No    Alcohol/week: 0.0 standard drinks  . Drug use: No  . Sexual activity:  Never  Other Topics Concern  . Not on file  Social History Narrative  . Not on file   Social Determinants of Health   Financial Resource Strain:   . Difficulty of Paying Living Expenses:   Food Insecurity:   . Worried About Charity fundraiser in the Last Year:   . Arboriculturist in the Last Year:   Transportation Needs:   . Film/video editor (Medical):   Marland Kitchen Lack of Transportation (Non-Medical):   Physical Activity:   . Days of Exercise per Week:   . Minutes of Exercise per Session:   Stress:   . Feeling of Stress :   Social Connections:   . Frequency of Communication with Friends and Family:   . Frequency of Social Gatherings with Friends and Family:   . Attends Religious Services:   . Active Member of Clubs or Organizations:   . Attends Archivist Meetings:   Marland Kitchen Marital Status:     Outpatient Encounter Medications as of 06/25/2019  Medication Sig  . albuterol (VENTOLIN HFA) 108 (90 Base) MCG/ACT inhaler TAKE 2 PUFFS BY MOUTH EVERY 6 HOURS  AS NEEDED FOR WHEEZE OR SHORTNESS OF BREATH  . amLODipine (NORVASC) 10 MG tablet Take 5 mg by mouth daily.   . fexofenadine (ALLEGRA) 60 MG tablet TAKE 1 TABLET EVERY DAY  . furosemide (LASIX) 20 MG tablet Take 1 tablet (20 mg total) by mouth daily.  Marland Kitchen gabapentin (NEURONTIN) 100 MG capsule Take two capsules q hs  . irbesartan (AVAPRO) 300 MG tablet Take 1 tablet (300 mg total) by mouth daily.  . meloxicam (MOBIC) 7.5 MG tablet   . metoCLOPramide (REGLAN) 5 MG tablet Take 5 mg by mouth 2 (two) times daily.  . metoprolol succinate (TOPROL-XL) 25 MG 24 hr tablet TAKE 1 TABLET EVERY DAY  . ondansetron (ZOFRAN) 4 MG tablet Take 1 tablet (4 mg total) by mouth 2 (two) times daily as needed for nausea or vomiting.  Marland Kitchen oxyCODONE (OXY IR/ROXICODONE) 5 MG immediate release tablet Take 5 mg by mouth 3 (three) times daily.   . pantoprazole (PROTONIX) 40 MG tablet Take 40 mg by mouth 2 (two) times daily.  . simvastatin (ZOCOR) 10 MG tablet Take 1 tablet (10 mg total) by mouth at bedtime.  . solifenacin (VESICARE) 5 MG tablet Take 1 tablet (5 mg total) by mouth daily.  . traMADol-acetaminophen (ULTRACET) 37.5-325 MG per tablet Take 1 tablet by mouth 2 (two) times daily as needed.    No facility-administered encounter medications on file as of 06/25/2019.    Review of Systems  Constitutional: Negative for appetite change and unexpected weight change.  HENT: Negative for congestion and sinus pressure.   Respiratory: Negative for cough, chest tightness and shortness of breath.   Cardiovascular: Negative for chest pain, palpitations and leg swelling.  Gastrointestinal: Negative for abdominal pain, diarrhea, nausea and vomiting.  Genitourinary: Negative for difficulty urinating and dysuria.  Musculoskeletal: Positive for back pain. Negative for myalgias.  Skin: Negative for color change and rash.  Neurological: Negative for dizziness, light-headedness and headaches.  Psychiatric/Behavioral: Negative for  agitation and dysphoric mood.       Objective:    Physical Exam Constitutional:      General: She is not in acute distress.    Appearance: Normal appearance.  HENT:     Head: Normocephalic and atraumatic.     Right Ear: External ear normal.     Left Ear: External ear normal.  Eyes:     General:        Right eye: No discharge.        Left eye: No discharge.     Conjunctiva/sclera: Conjunctivae normal.  Neck:     Thyroid: No thyromegaly.  Cardiovascular:     Rate and Rhythm: Normal rate and regular rhythm.  Pulmonary:     Effort: No respiratory distress.     Breath sounds: Normal breath sounds. No wheezing.  Abdominal:     General: Bowel sounds are normal.     Palpations: Abdomen is soft.     Tenderness: There is no abdominal tenderness.  Musculoskeletal:        General: No swelling or tenderness.     Cervical back: Neck supple. No tenderness.  Lymphadenopathy:     Cervical: No cervical adenopathy.  Skin:    Findings: No erythema or rash.  Neurological:     Mental Status: She is alert.  Psychiatric:        Mood and Affect: Mood normal.        Behavior: Behavior normal.     BP 138/78   Pulse 71   Temp 98 F (36.7 C)   Resp 16   Ht 5\' 2"  (1.575 m)   Wt 154 lb (69.9 kg)   SpO2 96%   BMI 28.17 kg/m  Wt Readings from Last 3 Encounters:  06/25/19 154 lb (69.9 kg)  06/11/19 153 lb 9.6 oz (69.7 kg)  06/03/19 152 lb 12.8 oz (69.3 kg)     Lab Results  Component Value Date   WBC 6.8 04/15/2019   HGB 13.1 04/15/2019   HCT 39.1 04/15/2019   PLT 261.0 04/15/2019   GLUCOSE 88 04/15/2019   CHOL 149 04/15/2019   TRIG 164.0 (H) 04/15/2019   HDL 54.50 04/15/2019   LDLCALC 62 04/15/2019   ALT 10 04/15/2019   AST 15 04/15/2019   NA 139 04/15/2019   K 4.4 04/15/2019   CL 103 04/15/2019   CREATININE 0.82 04/15/2019   BUN 16 04/15/2019   CO2 26 04/15/2019   TSH 2.36 12/03/2018   INR 0.91 06/11/2015   HGBA1C 5.5 04/15/2019       Assessment & Plan:    Problem List Items Addressed This Visit    Chronic back pain    Followed by Dr Sharlet Salina.       Hypercholesteremia    On simvastatin.  Low cholesterol diet and exercise.  Follow lipid panel and liver function tests.        Hypertension    Blood pressure doing better as outlined.  On amlodipine, metoprolol, lasix and avapro.  Follow pressures.  Follow metabolic panel.        Swelling of left lower extremity    Lower extremity swelling much improved.  Continue compression hose.  Follow.  Continue leg elevation.            [name], MD

## 2019-06-30 ENCOUNTER — Encounter: Payer: Self-pay | Admitting: Internal Medicine

## 2019-06-30 NOTE — Assessment & Plan Note (Signed)
Blood pressure doing better as outlined.  On amlodipine, metoprolol, lasix and avapro.  Follow pressures.  Follow metabolic panel.

## 2019-06-30 NOTE — Assessment & Plan Note (Signed)
Followed by Dr Chasnis.   

## 2019-06-30 NOTE — Assessment & Plan Note (Signed)
On simvastatin.  Low cholesterol diet and exercise.  Follow lipid panel and liver function tests.   

## 2019-06-30 NOTE — Assessment & Plan Note (Signed)
Lower extremity swelling much improved.  Continue compression hose.  Follow.  Continue leg elevation.

## 2019-08-17 ENCOUNTER — Other Ambulatory Visit: Payer: Self-pay | Admitting: Internal Medicine

## 2019-08-27 DIAGNOSIS — M48062 Spinal stenosis, lumbar region with neurogenic claudication: Secondary | ICD-10-CM | POA: Diagnosis not present

## 2019-08-27 DIAGNOSIS — M5136 Other intervertebral disc degeneration, lumbar region: Secondary | ICD-10-CM | POA: Diagnosis not present

## 2019-08-27 DIAGNOSIS — M5416 Radiculopathy, lumbar region: Secondary | ICD-10-CM | POA: Diagnosis not present

## 2019-09-23 DIAGNOSIS — M5416 Radiculopathy, lumbar region: Secondary | ICD-10-CM | POA: Diagnosis not present

## 2019-09-23 DIAGNOSIS — M5136 Other intervertebral disc degeneration, lumbar region: Secondary | ICD-10-CM | POA: Diagnosis not present

## 2019-09-23 DIAGNOSIS — M62830 Muscle spasm of back: Secondary | ICD-10-CM | POA: Diagnosis not present

## 2019-09-23 DIAGNOSIS — M7062 Trochanteric bursitis, left hip: Secondary | ICD-10-CM | POA: Diagnosis not present

## 2019-09-27 ENCOUNTER — Ambulatory Visit: Payer: Medicare HMO | Admitting: Internal Medicine

## 2019-10-07 ENCOUNTER — Other Ambulatory Visit: Payer: Self-pay

## 2019-10-07 ENCOUNTER — Ambulatory Visit: Payer: Medicare HMO | Admitting: Urology

## 2019-10-07 VITALS — BP 119/69 | HR 80

## 2019-10-07 DIAGNOSIS — N3946 Mixed incontinence: Secondary | ICD-10-CM

## 2019-10-07 MED ORDER — OXYBUTYNIN CHLORIDE ER 10 MG PO TB24
10.0000 mg | ORAL_TABLET | Freq: Every day | ORAL | 3 refills | Status: DC
Start: 2019-10-07 — End: 2020-09-11

## 2019-10-07 NOTE — Progress Notes (Signed)
10/07/2019 11:00 AM   [name] [birth year] [id number]  Referring provider: [name], [address] [place],  [place] [address]  Chief Complaint  Patient presents with  . Urinary Incontinence    HPI: Patient seen by Dr. Chauncey Cruel in June 2020 for urinary retention in frail elderly lady who is on oxybutynin and narcotics. Subsequently had a succesfyk trial of voiding. He did not want to try more antimuscarinics   Patient voids every 2 or 3 hours. She gets up 3-5 times at night. She wears a least 2 pads a day sometimes moderately wet  Patient failed Myrbetriq. She failed percutaneous tibial nerve stimulation.   Patient has reached the end of the treatment algorithm. Antimuscarinics and potential risk of retention discussed. We had a discussion about the risk of constipation and dry mouth and primary or secondary urinary retention. She would like to try oxybutynin ER 10 mg to see nurse practitioner with a residual 1 month. This fails I will try one last antimuscarinic and check another residual in a month. If these 2 medications fail or have side effects or retention she has reached the end of the algorithm and should not have InterStim or Botox  Patient failed antimuscarinic. Residual today 10 mL. Send prescription of Vesicare 5 mg and check residual in 6 or 7 weeks.   Family thinks patient is only getting up 3-4 times a night as does the patient instead of 6-8 times.  Still leaking during the day and clinically not infected.  Not on appropriate she is on oxybutynin and Vesicare.  There was question in the past and even currently that it could make her confusion at times a little worse.  It is not definite but possible   We decided to stop the oxybutynin.  We will see how she does on Vesicare 5 mg.  Reassess in 6 months.  I think we have reached the end of treatment algorithm  Today Patient continues to have nocturia 1-4 times.  Still has  urge incontinence.  No infections.  Frequency stable.  She wants to stop the Vesicare and try the oxybutynin again   PMH: Past Medical History:  Diagnosis Date  . Anemia   . Barrett's esophagus   . Bowel obstruction (HCC)    s/p adhesion resection  . Chronic back pain   . Colon cancer ([place]) 1988 and 1989   adenocarcinoma, s/p resection x  2 and chemo  . Diverticulitis   . GERD (gastroesophageal reflux disease)   . H/O open leg wound   . Hiatal hernia   . Hypercholesteremia   . Hypertension   . Lumbar scoliosis   . OA (osteoarthritis)   . Peripheral neuropathy   . Recurrent sinus infections   . Renal cyst   . S/P chemotherapy, time since greater than 12 weeks    colon cancer  . Vertigo     Surgical History: Past Surgical History:  Procedure Laterality Date  . ABDOMINAL HYSTERECTOMY  1982  . adhesions resected     bowel obstruction  . APPENDECTOMY    . BREAST BIOPSY Left    negative 06/14/1985  . CHOLECYSTECTOMY  1994  . COLON RESECTION     x2.  s/p colon cancer  . [place]  . EXCISIONAL HEMORRHOIDECTOMY     with tubal ligation  . EXPLORATORY LAPAROTOMY  1991   Secondary to SBO    Home Medications:  Allergies  as of 10/07/2019      Reactions   Fentanyl Other (See Comments)   confusion      Medication List       Accurate as of October 07, 2019 11:00 AM. If you have any questions, ask your nurse or doctor.        STOP taking these medications   ondansetron 4 MG tablet Commonly known as: Zofran Stopped by: Reece Packer, MD     TAKE these medications   albuterol 108 (90 Base) MCG/ACT inhaler Commonly known as: VENTOLIN HFA TAKE 2 PUFFS BY MOUTH EVERY 6 HOURS AS NEEDED FOR WHEEZE OR SHORTNESS OF BREATH   amLODipine 10 MG tablet Commonly known as: NORVASC Take 5 mg by mouth daily.   fexofenadine 60 MG tablet Commonly known as: ALLEGRA TAKE 1 TABLET EVERY DAY   furosemide 20 MG tablet Commonly known as:  LASIX Take 1 tablet (20 mg total) by mouth daily.   gabapentin 100 MG capsule Commonly known as: NEURONTIN Take two capsules q hs   irbesartan 300 MG tablet Commonly known as: Avapro Take 1 tablet (300 mg total) by mouth daily.   meloxicam 7.5 MG tablet Commonly known as: MOBIC   metoCLOPramide 5 MG tablet Commonly known as: REGLAN Take 5 mg by mouth 2 (two) times daily.   metoprolol succinate 25 MG 24 hr tablet Commonly known as: TOPROL-XL TAKE 1 TABLET EVERY DAY   optichamber diamond Misc   oxyCODONE 5 MG immediate release tablet Commonly known as: Oxy IR/ROXICODONE Take 5 mg by mouth 3 (three) times daily.   pantoprazole 40 MG tablet Commonly known as: PROTONIX Take 40 mg by mouth 2 (two) times daily.   simvastatin 10 MG tablet Commonly known as: ZOCOR TAKE 1 TABLET (10 MG TOTAL) BY MOUTH AT BEDTIME.   solifenacin 5 MG tablet Commonly known as: VESICARE Take 1 tablet (5 mg total) by mouth daily.   traMADol-acetaminophen 37.5-325 MG tablet Commonly known as: ULTRACET Take 1 tablet by mouth 2 (two) times daily as needed.       Allergies:  Allergies  Allergen Reactions  . Fentanyl Other (See Comments)    confusion    Family History: Family History  Problem Relation Age of Onset  . Breast cancer Mother   . Asthma Mother   . Stroke Father   . Hypertension Father   . Diabetes Father   . Ovarian cancer Sister        x2  . Prostate cancer Brother   . Hypertension Brother        x3  . Heart disease Brother   . Hypercholesterolemia Brother        x3  . Diabetes Brother   . Spina bifida Grandchild   . Hematuria Neg Hx   . Kidney cancer Neg Hx   . Kidney disease Neg Hx   . Sickle cell trait Neg Hx   . Tuberculosis Neg Hx     Social History:  reports that she has never smoked. She has never used smokeless tobacco. She reports that she does not drink alcohol and does not use drugs.  ROS:                                         Physical Exam: BP 119/69   Pulse 80   Constitutional:  Alert and oriented, No acute distress.  Laboratory Data: Lab  Results  Component Value Date   WBC 6.8 04/15/2019   HGB 13.1 04/15/2019   HCT 39.1 04/15/2019   MCV 95.4 04/15/2019   PLT 261.0 04/15/2019    Lab Results  Component Value Date   CREATININE 0.82 04/15/2019    No results found for: PSA  No results found for: TESTOSTERONE  Lab Results  Component Value Date   HGBA1C 5.5 04/15/2019    Urinalysis    Component Value Date/Time   COLORURINE STRAW (A) 06/07/2018 0944   APPEARANCEUR CLEAR (A) 06/07/2018 0944   LABSPEC 1.006 06/07/2018 0944   PHURINE 6.0 06/07/2018 0944   GLUCOSEU NEGATIVE 06/07/2018 0944   GLUCOSEU NEGATIVE 04/04/2018 1255   HGBUR NEGATIVE 06/07/2018 0944   BILIRUBINUR NEGATIVE 06/07/2018 0944   KETONESUR NEGATIVE 06/07/2018 0944   PROTEINUR NEGATIVE 06/07/2018 0944   UROBILINOGEN 0.2 04/04/2018 1255   NITRITE NEGATIVE 06/07/2018 0944   LEUKOCYTESUR NEGATIVE 06/07/2018 0944    Pertinent Imaging:   Assessment & Plan: Oxybutynin ER 10 mg 90x3 sent see in 1 year.  Cancel appointment if she stops all medication  There are no diagnoses linked to this encounter.  No follow-ups on file.  Reece Packer, MD  [facility] [address], [place], [place] [address] [phone]

## 2019-10-19 ENCOUNTER — Other Ambulatory Visit: Payer: Self-pay | Admitting: Internal Medicine

## 2019-10-26 ENCOUNTER — Other Ambulatory Visit: Payer: Self-pay | Admitting: Nurse Practitioner

## 2019-10-28 ENCOUNTER — Other Ambulatory Visit: Payer: Self-pay | Admitting: Internal Medicine

## 2019-10-29 ENCOUNTER — Other Ambulatory Visit: Payer: Self-pay

## 2019-10-29 ENCOUNTER — Telehealth (INDEPENDENT_AMBULATORY_CARE_PROVIDER_SITE_OTHER): Payer: Medicare HMO | Admitting: Internal Medicine

## 2019-10-29 ENCOUNTER — Encounter: Payer: Self-pay | Admitting: Internal Medicine

## 2019-10-29 DIAGNOSIS — Z85038 Personal history of other malignant neoplasm of large intestine: Secondary | ICD-10-CM

## 2019-10-29 DIAGNOSIS — D509 Iron deficiency anemia, unspecified: Secondary | ICD-10-CM

## 2019-10-29 DIAGNOSIS — R42 Dizziness and giddiness: Secondary | ICD-10-CM

## 2019-10-29 DIAGNOSIS — E7800 Pure hypercholesterolemia, unspecified: Secondary | ICD-10-CM | POA: Diagnosis not present

## 2019-10-29 DIAGNOSIS — I10 Essential (primary) hypertension: Secondary | ICD-10-CM | POA: Diagnosis not present

## 2019-10-29 MED ORDER — MECLIZINE HCL 12.5 MG PO TABS: 12.5000 mg | ORAL_TABLET | Freq: Two times a day (BID) | ORAL | 0 refills | Status: DC | PRN

## 2019-10-29 MED ORDER — ONDANSETRON 4 MG PO TBDP
4.0000 mg | ORAL_TABLET | Freq: Two times a day (BID) | ORAL | 0 refills | Status: DC | PRN
Start: 2019-10-29 — End: 2022-02-23

## 2019-10-29 NOTE — Progress Notes (Signed)
Patient ID: [name], female   DOB: 12-09-1928, 86 y.o.   MRN: [id number]   Virtual Visit via video Note  This visit type was conducted due to national recommendations for restrictions regarding the COVID-19 pandemic (e.g. social distancing).  This format is felt to be most appropriate for this patient at this time.  All issues noted in this document were discussed and addressed.  No physical exam was performed (except for noted visual exam findings with Video Visits).   I connected with [name] by a video enabled telemedicine application and verified that I am speaking with the correct person using two identifiers. Location patient: home Location provider: work  Persons participating in the virtual visit: patient, provider, pts daughter Jeani Hawking and pts husband Joneen Caraway)  The limitations, risks, security and privacy concerns of performing an evaluation and management service by video and the availability of in person appointments have been discussed.  It has also been discussed with the patient that there may be a patient responsible charge related to this service. The patient expressed understanding and agreed to proceed.   Reason for visit:  Scheduled follow up.   HPI: Reports she has been doing relatively well.  Reports dizziness recently. Did have emesis - a few nights ago. Dizziness, nausea.  No headache.  Has a history of inner ear, but states this was not as bad as her previous inner ear episodes. States did not feel bad like she usually does.  No chest pain or sob reported.  No abdominal pain.  States noticed some change with bowel movements recently, not going as much.  Had good bowel movement yesterday.  Blood pressure ding well - 123/65 and 126/74.  Overall feeling better.    ROS: See pertinent positives and negatives per HPI.  Past Medical History:  Diagnosis Date   Anemia    Barrett's esophagus    Bowel obstruction (HCC)    s/p adhesion resection   Chronic back pain     Colon cancer ([place]) 1988 and 1989   adenocarcinoma, s/p resection x  2 and chemo   Diverticulitis    GERD (gastroesophageal reflux disease)    H/O open leg wound    Hiatal hernia    Hypercholesteremia    Hypertension    Lumbar scoliosis    OA (osteoarthritis)    Peripheral neuropathy    Recurrent sinus infections    Renal cyst    S/P chemotherapy, time since greater than 12 weeks    colon cancer   Vertigo     Past Surgical History:  Procedure Laterality Date   ABDOMINAL HYSTERECTOMY  1982   adhesions resected     bowel obstruction   APPENDECTOMY     BREAST BIOPSY Left    negative 06/14/1985   CHOLECYSTECTOMY  1994   COLON RESECTION     x2.  s/p colon cancer   COLON SURGERY  1958 and 1989   For Colon Cancer   EXCISIONAL HEMORRHOIDECTOMY     with tubal ligation   EXPLORATORY LAPAROTOMY  1991   Secondary to SBO    Family History  Problem Relation Age of Onset   Breast cancer Mother    Asthma Mother    Stroke Father    Hypertension Father    Diabetes Father    Ovarian cancer Sister        x2   Prostate cancer Brother    Hypertension Brother        x3   Heart disease  Brother    Hypercholesterolemia Brother        x3   Diabetes Brother    Spina bifida Grandchild    Hematuria Neg Hx    Kidney cancer Neg Hx    Kidney disease Neg Hx    Sickle cell trait Neg Hx    Tuberculosis Neg Hx     SOCIAL HX: reviewed.    Current Outpatient Medications:    albuterol (VENTOLIN HFA) 108 (90 Base) MCG/ACT inhaler, TAKE 2 PUFFS BY MOUTH EVERY 6 HOURS AS NEEDED FOR WHEEZE OR SHORTNESS OF BREATH, Disp: 18 g, Rfl: 1   amLODipine (NORVASC) 10 MG tablet, TAKE 1 TABLET EVERY DAY, Disp: 90 tablet, Rfl: 1   fexofenadine (ALLEGRA) 60 MG tablet, TAKE 1 TABLET EVERY DAY, Disp: 90 tablet, Rfl: 3   furosemide (LASIX) 20 MG tablet, Take 1 tablet (20 mg total) by mouth daily., Disp: 4 tablet, Rfl: 0   gabapentin (NEURONTIN) 100 MG capsule,  TAKE 2 CAPSULES AT BEDTIME, Disp: 180 capsule, Rfl: 1   irbesartan (AVAPRO) 300 MG tablet, TAKE 1 TABLET EVERY DAY, Disp: 90 tablet, Rfl: 1   meloxicam (MOBIC) 7.5 MG tablet, , Disp: , Rfl:    metoCLOPramide (REGLAN) 5 MG tablet, Take 5 mg by mouth 2 (two) times daily., Disp: , Rfl:    metoprolol succinate (TOPROL-XL) 25 MG 24 hr tablet, TAKE 1 TABLET EVERY DAY, Disp: 90 tablet, Rfl: 1   oxybutynin (DITROPAN-XL) 10 MG 24 hr tablet, Take 1 tablet (10 mg total) by mouth daily., Disp: 90 tablet, Rfl: 3   oxyCODONE (OXY IR/ROXICODONE) 5 MG immediate release tablet, Take 5 mg by mouth 3 (three) times daily. , Disp: , Rfl:    pantoprazole (PROTONIX) 40 MG tablet, TAKE 1 TABLET TWICE DAILY, Disp: 180 tablet, Rfl: 0   simvastatin (ZOCOR) 10 MG tablet, TAKE 1 TABLET (10 MG TOTAL) BY MOUTH AT BEDTIME., Disp: 90 tablet, Rfl: 3   Spacer/Aero-Holding Chambers (OPTICHAMBER DIAMOND) MISC, , Disp: , Rfl:    traMADol-acetaminophen (ULTRACET) 37.5-325 MG per tablet, Take 1 tablet by mouth 2 (two) times daily as needed. , Disp: , Rfl:    meclizine (ANTIVERT) 12.5 MG tablet, Take 1 tablet (12.5 mg total) by mouth 2 (two) times daily as needed for dizziness., Disp: 20 tablet, Rfl: 0   ondansetron (ZOFRAN ODT) 4 MG disintegrating tablet, Take 1 tablet (4 mg total) by mouth 2 (two) times daily as needed for nausea or vomiting., Disp: 20 tablet, Rfl: 0  EXAM:  GENERAL: alert, oriented, appears well and in no acute distress  HEENT: atraumatic, conjunttiva clear, no obvious abnormalities on inspection of external nose and ears  NECK: normal movements of the head and neck  LUNGS: on inspection no signs of respiratory distress, breathing rate appears normal, no obvious gross SOB, gasping or wheezing  CV: no obvious cyanosis  PSYCH/NEURO: pleasant and cooperative, no obvious depression or anxiety, speech and thought processing grossly intact  ASSESSMENT AND PLAN:  Discussed the following assessment and  plan:  Hypertension Blood pressure doing well.  Continue amlodipine, avapro, metoprolol and lasix.  Follow pressures.  Follow metabolic panel.   Hypercholesteremia On simvastatin.  Low cholesterol diet and exercise.  Follow lipid panel and liver function tests.    History of colon cancer Followed by GI.   Hereditary hemochromatosis ([place]) Previously evaluated by hematology.  Has declined f/u.  Follow cbc and iron studies.    Dizziness Has a history of inner ear.  Recent dizziness.  States  this episode is not exactly like her previous inner ear episodes, in that she did not feel bad.  Resolved now.  Discussed further w/up.  Wants to monitor.  Request rx for meclizine and zofran.  Follow.    Anemia, iron deficiency Follow cbc.    Meds ordered this encounter  Medications   ondansetron (ZOFRAN ODT) 4 MG disintegrating tablet    Sig: Take 1 tablet (4 mg total) by mouth 2 (two) times daily as needed for nausea or vomiting.    Dispense:  20 tablet    Refill:  0   meclizine (ANTIVERT) 12.5 MG tablet    Sig: Take 1 tablet (12.5 mg total) by mouth 2 (two) times daily as needed for dizziness.    Dispense:  20 tablet    Refill:  0     I discussed the assessment and treatment plan with the patient. The patient was provided an opportunity to ask questions and all were answered. The patient agreed with the plan and demonstrated an understanding of the instructions.   The patient was advised to call back or seek an in-person evaluation if the symptoms worsen or if the condition fails to improve as anticipated.   [name], MD

## 2019-11-03 ENCOUNTER — Encounter: Payer: Self-pay | Admitting: Internal Medicine

## 2019-11-03 NOTE — Assessment & Plan Note (Signed)
Follow cbc.  

## 2019-11-03 NOTE — Assessment & Plan Note (Signed)
Blood pressure doing well.  Continue amlodipine, avapro, metoprolol and lasix.  Follow pressures.  Follow metabolic panel.

## 2019-11-03 NOTE — Assessment & Plan Note (Signed)
On simvastatin.  Low cholesterol diet and exercise.  Follow lipid panel and liver function tests.   

## 2019-11-03 NOTE — Assessment & Plan Note (Signed)
Previously evaluated by hematology.  Has declined f/u.  Follow cbc and iron studies.

## 2019-11-03 NOTE — Assessment & Plan Note (Signed)
Followed by GI

## 2019-11-03 NOTE — Assessment & Plan Note (Signed)
Has a history of inner ear.  Recent dizziness.  States this episode is not exactly like her previous inner ear episodes, in that she did not feel bad.  Resolved now.  Discussed further w/up.  Wants to monitor.  Request rx for meclizine and zofran.  Follow.

## 2019-11-08 ENCOUNTER — Telehealth: Payer: Self-pay | Admitting: Internal Medicine

## 2019-11-08 NOTE — Telephone Encounter (Signed)
Reviewed.  Thank him for the call and update.  With her history, we do need to be careful with her bowels, etc.  Confirm taking medication (miralax, etc) - to help keep bowels moving.  Any problems, let us know.

## 2019-11-08 NOTE — Telephone Encounter (Signed)
Will you please triage her

## 2019-11-08 NOTE — Telephone Encounter (Signed)
Pt's husband said pt hasn't had a bowel movement in 5 days and nausea and dizziness last night. He gave her meclizine one tablet. He said Dr. Nicki Reaper told him if it recurs again to call her. He would like a call back.

## 2019-11-08 NOTE — Telephone Encounter (Signed)
Spoken to patient, patient has hx of bowel blockage, gave meclizine last night for dizziness and nausea last. The medication helped and patient is no longer having those sx. Husband stated patient just had a bowel movement and there is no more concern. He stated he was worried due to her history. Patients husband stated all is well now.

## 2019-11-11 NOTE — Telephone Encounter (Signed)
Husband aware. Confirmed taking medication

## 2019-11-14 ENCOUNTER — Telehealth: Payer: Self-pay | Admitting: Internal Medicine

## 2019-11-14 NOTE — Telephone Encounter (Signed)
Yes.  Per the latest guidelines we received states "should" get if 57 and older - 6 month after last pfizer vaccine

## 2019-11-14 NOTE — Telephone Encounter (Signed)
Advised that recommendations are now 6 months after second vaccine if they had pfizer and are 20 and older. Just wanted to confirm with you that there was nothing else to add.

## 2019-11-14 NOTE — Telephone Encounter (Signed)
Noted  

## 2019-11-14 NOTE — Telephone Encounter (Signed)
Patient called in checking on the information about the booster shot wanted to know if she should get it .

## 2019-11-18 ENCOUNTER — Telehealth: Payer: Self-pay | Admitting: Internal Medicine

## 2019-11-18 DIAGNOSIS — Z1231 Encounter for screening mammogram for malignant neoplasm of breast: Secondary | ICD-10-CM

## 2019-11-18 NOTE — Telephone Encounter (Signed)
Order placed for mammogram.

## 2019-11-18 NOTE — Telephone Encounter (Signed)
Patient called in needs a referral for a mammogram

## 2019-11-19 NOTE — Telephone Encounter (Signed)
Left detailed message for pt giving her the number for [facility].

## 2019-11-21 ENCOUNTER — Ambulatory Visit: Payer: Medicare HMO

## 2019-11-26 ENCOUNTER — Ambulatory Visit (INDEPENDENT_AMBULATORY_CARE_PROVIDER_SITE_OTHER): Payer: Medicare HMO

## 2019-11-26 VITALS — BP 141/81 | HR 58 | Ht 62.0 in | Wt 154.0 lb

## 2019-11-26 DIAGNOSIS — Z0000 Encounter for general adult medical examination without abnormal findings: Secondary | ICD-10-CM

## 2019-11-26 NOTE — Patient Instructions (Addendum)
[name] , Thank you for taking time to come for your Medicare Wellness Visit. I appreciate your ongoing commitment to your health goals. Please review the following plan we discussed and let me know if I can assist you in the future.   These are the goals we discussed: Goals    . Follow up with Primary Care Provider       This is a list of the screening recommended for you and due dates:  Health Maintenance  Topic Date Due  . DEXA scan (bone density measurement)  Never done  . Flu Shot  05/14/2020*  . Mammogram  12/10/2019  . Tetanus Vaccine  04/14/2028  . COVID-19 Vaccine  Completed  . Pneumonia vaccines  Completed  *Topic was postponed. The date shown is not the original due date.    Immunizations Immunization History  Administered Date(s) Administered  . Fluad Quad(high Dose 65+) 12/03/2018  . Influenza Split 12/08/2011  . Influenza, High Dose Seasonal PF 11/17/2015, 10/18/2016, 11/27/2017  . Influenza,inj,Quad PF,6+ Mos 10/31/2013, 10/23/2014  . Influenza-Unspecified 11/17/2015  . PFIZER SARS-COV-2 Vaccination 03/04/2019, 04/04/2019  . Pneumococcal Conjugate-13 05/07/2013  . Pneumococcal-Unspecified 11/30/2000, 01/04/2006  . Td 11/20/2007  . Tdap 04/15/2018  . Zoster Recombinat (Shingrix) 09/19/2017, 12/28/2017   Keep all routine maintenance appointments.   Follow up 01/31/20 @ 3:00  Advanced directives: yes, on file  Conditions/risks identified: none new  Follow up in one year for your annual wellness visit.   Preventive Care 29 Years and Older, Female Preventive care refers to lifestyle choices and visits with your health care provider that can promote health and wellness. What does preventive care include?  A yearly physical exam. This is also called an annual well check.  Dental exams once or twice a year.  Routine eye exams. Ask your health care provider how often you should have your eyes checked.  Personal lifestyle choices, including:  Daily  care of your teeth and gums.  Regular physical activity.  Eating a healthy diet.  Avoiding tobacco and drug use.  Limiting alcohol use.  Practicing safe sex.  Taking low-dose aspirin every day.  Taking vitamin and mineral supplements as recommended by your health care provider. What happens during an annual well check? The services and screenings done by your health care provider during your annual well check will depend on your age, overall health, lifestyle risk factors, and family history of disease. Counseling  Your health care provider may ask you questions about your:  Alcohol use.  Tobacco use.  Drug use.  Emotional well-being.  Home and relationship well-being.  Sexual activity.  Eating habits.  History of falls.  Memory and ability to understand (cognition).  Work and work Statistician.  Reproductive health. Screening  You may have the following tests or measurements:  Height, weight, and BMI.  Blood pressure.  Lipid and cholesterol levels. These may be checked every 5 years, or more frequently if you are over 42 years old.  Skin check.  Lung cancer screening. You may have this screening every year starting at age 43 if you have a 30-pack-year history of smoking and currently smoke or have quit within the past 15 years.  Fecal occult blood test (FOBT) of the stool. You may have this test every year starting at age 24.  Flexible sigmoidoscopy or colonoscopy. You may have a sigmoidoscopy every 5 years or a colonoscopy every 10 years starting at age 70.  Hepatitis C blood test.  Hepatitis B blood test.  Sexually transmitted disease (STD) testing.  Diabetes screening. This is done by checking your blood sugar (glucose) after you have not eaten for a while (fasting). You may have this done every 1-3 years.  Bone density scan. This is done to screen for osteoporosis. You may have this done starting at age 33.  Mammogram. This may be done every 1-2  years. Talk to your health care provider about how often you should have regular mammograms. Talk with your health care provider about your test results, treatment options, and if necessary, the need for more tests. Vaccines  Your health care provider may recommend certain vaccines, such as:  Influenza vaccine. This is recommended every year.  Tetanus, diphtheria, and acellular pertussis (Tdap, Td) vaccine. You may need a Td booster every 10 years.  Zoster vaccine. You may need this after age 25.  Pneumococcal 13-valent conjugate (PCV13) vaccine. One dose is recommended after age 79.  Pneumococcal polysaccharide (PPSV23) vaccine. One dose is recommended after age 72. Talk to your health care provider about which screenings and vaccines you need and how often you need them. This information is not intended to replace advice given to you by your health care provider. Make sure you discuss any questions you have with your health care provider. Document Released: 02/27/2015 Document Revised: 10/21/2015 Document Reviewed: 12/02/2014 Elsevier Interactive Patient Education  2017 [place] Prevention in the Home Falls can cause injuries. They can happen to people of all ages. There are many things you can do to make your home safe and to help prevent falls. What can I do on the outside of my home?  Regularly fix the edges of walkways and driveways and fix any cracks.  Remove anything that might make you trip as you walk through a door, such as a raised step or threshold.  Trim any bushes or trees on the path to your home.  Use bright outdoor lighting.  Clear any walking paths of anything that might make someone trip, such as rocks or tools.  Regularly check to see if handrails are loose or broken. Make sure that both sides of any steps have handrails.  Any raised decks and porches should have guardrails on the edges.  Have any leaves, snow, or ice cleared regularly.  Use  sand or salt on walking paths during winter.  Clean up any spills in your garage right away. This includes oil or grease spills. What can I do in the bathroom?  Use night lights.  Install grab bars by the toilet and in the tub and shower. Do not use towel bars as grab bars.  Use non-skid mats or decals in the tub or shower.  If you need to sit down in the shower, use a plastic, non-slip stool.  Keep the floor dry. Clean up any water that spills on the floor as soon as it happens.  Remove soap buildup in the tub or shower regularly.  Attach bath mats securely with double-sided non-slip rug tape.  Do not have throw rugs and other things on the floor that can make you trip. What can I do in the bedroom?  Use night lights.  Make sure that you have a light by your bed that is easy to reach.  Do not use any sheets or blankets that are too big for your bed. They should not hang down onto the floor.  Have a firm chair that has side arms. You can use this for support while you get  dressed.  Do not have throw rugs and other things on the floor that can make you trip. What can I do in the kitchen?  Clean up any spills right away.  Avoid walking on wet floors.  Keep items that you use a lot in easy-to-reach places.  If you need to reach something above you, use a strong step stool that has a grab bar.  Keep electrical cords out of the way.  Do not use floor polish or wax that makes floors slippery. If you must use wax, use non-skid floor wax.  Do not have throw rugs and other things on the floor that can make you trip. What can I do with my stairs?  Do not leave any items on the stairs.  Make sure that there are handrails on both sides of the stairs and use them. Fix handrails that are broken or loose. Make sure that handrails are as long as the stairways.  Check any carpeting to make sure that it is firmly attached to the stairs. Fix any carpet that is loose or worn.  Avoid  having throw rugs at the top or bottom of the stairs. If you do have throw rugs, attach them to the floor with carpet tape.  Make sure that you have a light switch at the top of the stairs and the bottom of the stairs. If you do not have them, ask someone to add them for you. What else can I do to help prevent falls?  Wear shoes that:  Do not have high heels.  Have rubber bottoms.  Are comfortable and fit you well.  Are closed at the toe. Do not wear sandals.  If you use a stepladder:  Make sure that it is fully opened. Do not climb a closed stepladder.  Make sure that both sides of the stepladder are locked into place.  Ask someone to hold it for you, if possible.  Clearly mark and make sure that you can see:  Any grab bars or handrails.  First and last steps.  Where the edge of each step is.  Use tools that help you move around (mobility aids) if they are needed. These include:  Canes.  Walkers.  Scooters.  Crutches.  Turn on the lights when you go into a dark area. Replace any light bulbs as soon as they burn out.  Set up your furniture so you have a clear path. Avoid moving your furniture around.  If any of your floors are uneven, fix them.  If there are any pets around you, be aware of where they are.  Review your medicines with your doctor. Some medicines can make you feel dizzy. This can increase your chance of falling. Ask your doctor what other things that you can do to help prevent falls. This information is not intended to replace advice given to you by your health care provider. Make sure you discuss any questions you have with your health care provider. Document Released: 11/27/2008 Document Revised: 07/09/2015 Document Reviewed: 03/07/2014 Elsevier Interactive Patient Education  2017 [employer].

## 2019-11-26 NOTE — Progress Notes (Addendum)
Subjective:   ASIANA BENNINGER is a 86 y.o. female who presents for Medicare Annual (Subsequent) preventive examination.  Review of Systems    No ROS.  Medicare Wellness Virtual Visit.   Cardiac Risk Factors include: advanced age (>78men, >24 women);hypertension     Objective:    Today's Vitals   11/26/19 1250  BP: (!) 141/81  Pulse: (!) 58  Weight: 154 lb (69.9 kg)  Height: 5\' 2"  (1.575 m)   Body mass index is 28.17 kg/m.  Advanced Directives 11/26/2019 11/20/2018 06/07/2018 04/15/2018 05/12/2017 04/26/2017 08/11/2016  Does Patient Have a Medical Advance Directive? Yes Yes Yes No Yes Yes Yes  Type of Paramedic of [place];Living will [place];Living will Living will - [place];Living will [place];Living will [place];Living will  Does patient want to make changes to medical advance directive? No - Patient declined No - Patient declined - - - No - Patient declined -  Copy of [place] in Chart? Yes - validated most recent copy scanned in chart (See row information) Yes - validated most recent copy scanned in chart (See row information) - - No - copy requested Yes No - copy requested  Would patient like information on creating a medical advance directive? - - - No - Patient declined - - -    Current Medications (verified) Outpatient Encounter Medications as of 11/26/2019  Medication Sig  . albuterol (VENTOLIN HFA) 108 (90 Base) MCG/ACT inhaler TAKE 2 PUFFS BY MOUTH EVERY 6 HOURS AS NEEDED FOR WHEEZE OR SHORTNESS OF BREATH  . amLODipine (NORVASC) 10 MG tablet TAKE 1 TABLET EVERY DAY  . fexofenadine (ALLEGRA) 60 MG tablet TAKE 1 TABLET EVERY DAY  . FLUAD QUADRIVALENT 0.5 ML injection   . furosemide (LASIX) 20 MG tablet Take 1 tablet (20 mg total) by mouth daily.  Marland Kitchen gabapentin (NEURONTIN) 100 MG capsule TAKE 2 CAPSULES AT BEDTIME  . irbesartan (AVAPRO) 300 MG tablet  TAKE 1 TABLET EVERY DAY  . meclizine (ANTIVERT) 12.5 MG tablet Take 1 tablet (12.5 mg total) by mouth 2 (two) times daily as needed for dizziness.  . meloxicam (MOBIC) 7.5 MG tablet   . metoCLOPramide (REGLAN) 5 MG tablet Take 5 mg by mouth 2 (two) times daily.  . metoprolol succinate (TOPROL-XL) 25 MG 24 hr tablet TAKE 1 TABLET EVERY DAY  . ondansetron (ZOFRAN ODT) 4 MG disintegrating tablet Take 1 tablet (4 mg total) by mouth 2 (two) times daily as needed for nausea or vomiting.  Marland Kitchen oxybutynin (DITROPAN-XL) 10 MG 24 hr tablet Take 1 tablet (10 mg total) by mouth daily.  Marland Kitchen oxyCODONE (OXY IR/ROXICODONE) 5 MG immediate release tablet Take 5 mg by mouth 3 (three) times daily.   . pantoprazole (PROTONIX) 40 MG tablet TAKE 1 TABLET TWICE DAILY  . simvastatin (ZOCOR) 10 MG tablet TAKE 1 TABLET (10 MG TOTAL) BY MOUTH AT BEDTIME.  Marland Kitchen Spacer/Aero-Holding Chambers [facility] DIAMOND) MISC   . traMADol-acetaminophen (ULTRACET) 37.5-325 MG per tablet Take 1 tablet by mouth 2 (two) times daily as needed.    No facility-administered encounter medications on file as of 11/26/2019.    Allergies (verified) Fentanyl   History: Past Medical History:  Diagnosis Date  . Anemia   . Barrett's esophagus   . Bowel obstruction (HCC)    s/p adhesion resection  . Chronic back pain   . Colon cancer ([place]) 1988 and 1989   adenocarcinoma, s/p resection  x  2 and chemo  . Diverticulitis   . GERD (gastroesophageal reflux disease)   . H/O open leg wound   . Hiatal hernia   . Hypercholesteremia   . Hypertension   . Lumbar scoliosis   . OA (osteoarthritis)   . Peripheral neuropathy   . Recurrent sinus infections   . Renal cyst   . S/P chemotherapy, time since greater than 12 weeks    colon cancer  . Vertigo    Past Surgical History:  Procedure Laterality Date  . ABDOMINAL HYSTERECTOMY  1982  . adhesions resected     bowel obstruction  . APPENDECTOMY    . BREAST BIOPSY Left    negative 06/14/1985  .  CHOLECYSTECTOMY  1994  . COLON RESECTION     x2.  s/p colon cancer  . [place]  . EXCISIONAL HEMORRHOIDECTOMY     with tubal ligation  . EXPLORATORY LAPAROTOMY  1991   Secondary to SBO   Family History  Problem Relation Age of Onset  . Breast cancer Mother   . Asthma Mother   . Stroke Father   . Hypertension Father   . Diabetes Father   . Ovarian cancer Sister        x2  . Prostate cancer Brother   . Hypertension Brother        x3  . Heart disease Brother   . Hypercholesterolemia Brother        x3  . Diabetes Brother   . Spina bifida Grandchild   . Hematuria Neg Hx   . Kidney cancer Neg Hx   . Kidney disease Neg Hx   . Sickle cell trait Neg Hx   . Tuberculosis Neg Hx    Social History   Socioeconomic History  . Marital status: Married    Spouse name: Not on file  . Number of children: 4  . Years of education: 12th grade  . Highest education level: Not on file  Occupational History  . Occupation: homemaker  Tobacco Use  . Smoking status: Never Smoker  . Smokeless tobacco: Never Used  Vaping Use  . Vaping Use: Never used  Substance and Sexual Activity  . Alcohol use: No    Alcohol/week: 0.0 standard drinks  . Drug use: No  . Sexual activity: Never  Other Topics Concern  . Not on file  Social History Narrative  . Not on file   Social Determinants of Health   Financial Resource Strain: Low Risk   . Difficulty of Paying Living Expenses: Not hard at all  Food Insecurity: No Food Insecurity  . Worried About Charity fundraiser in the Last Year: Never true  . Ran Out of Food in the Last Year: Never true  Transportation Needs: No Transportation Needs  . Lack of Transportation (Medical): No  . Lack of Transportation (Non-Medical): No  Physical Activity:   . Days of Exercise per Week: Not on file  . Minutes of Exercise per Session: Not on file  Stress: No Stress Concern Present  . Feeling of Stress : Not at all    Social Connections: Unknown  . Frequency of Communication with Friends and Family: Not on file  . Frequency of Social Gatherings with Friends and Family: Not on file  . Attends Religious Services: Not on file  . Active Member of Clubs or Organizations: Not on file  . Attends Archivist Meetings: Not on file  .  Marital Status: Married    Tobacco Counseling Counseling given: Not Answered   Clinical Intake:  Pre-visit preparation completed: Yes        Diabetes: No  How often do you need to have someone help you when you read instructions, pamphlets, or other written materials from your doctor or pharmacy?: 3 - Sometimes      Activities of Daily Living In your present state of health, do you have any difficulty performing the following activities: 11/26/2019  Hearing? N  Vision? N  Difficulty concentrating or making decisions? Y  Comment Age appropriate with memory delay  Walking or climbing stairs? Y  Comment Unsteady gait. Walker in use when ambulating.  Dressing or bathing? Y  Comment Husband assist.  Doing errands, shopping? Y  Comment She does not drive.  Preparing Food and eating ? Y  Comment Husband assist  Using the Toilet? N  In the past six months, have you accidently leaked urine? N  Do you have problems with loss of bowel control? N  Managing your Medications? Y  Comment Husband assist  Managing your Finances? Y  Comment Husband assist  Housekeeping or managing your Housekeeping? Y  Comment Maid assist  Some recent data might be hidden    Patient Care Team: [name], MD as PCP - General (Internal Medicine)  Indicate any recent Medical Services you may have received from other than [facility] providers in the past year (date may be approximate).     Assessment:   This is a routine wellness examination for Jomarie.  I connected with Praise today by telephone and verified that I am speaking with the correct person using two  identifiers. Location patient: home Location provider: work Persons participating in the virtual visit: patient, Marine scientist.    I discussed the limitations, risks, security and privacy concerns of performing an evaluation and management service by telephone and the availability of in person appointments. The patient expressed understanding and verbally consented to this telephonic visit.    Interactive audio and video telecommunications were attempted between this provider and patient, however failed, due to patient having technical difficulties OR patient did not have access to video capability.  We continued and completed visit with audio only.  Some vital signs may be absent or patient reported.   Hearing/Vision screen  Hearing Screening   125Hz  250Hz  500Hz  1000Hz  2000Hz  3000Hz  4000Hz  6000Hz  8000Hz   Right ear:           Left ear:           Comments: Patient is able to hear conversational tones without difficulty.  No issues reported.  Vision Screening Comments: Visual acuity not assessed, virtual visit.  Dietary issues and exercise activities discussed: Current Exercise Habits: Home exercise routine, Type of exercise: walking, Intensity: Mild Healthy diet Good water intake  Goals    . Follow up with Primary Care Provider      Depression Screen PHQ 2/9 Scores 11/26/2019 11/20/2018 07/23/2018 04/26/2017 08/15/2016 03/30/2016 12/17/2015  PHQ - 2 Score 0 0 0 0 0 0 0    Fall Risk Fall Risk  11/26/2019 10/29/2019 11/20/2018 04/26/2017 08/15/2016  Falls in the past year? 0 0 0 No No  Number falls in past yr: 0 - - - -  Follow up Falls evaluation completed Falls evaluation completed - - -   Handrails in use when climbing stairs? Yes Home free of loose throw rugs in walkways, pet beds, electrical cords, etc? Yes  Adequate lighting in your home  to reduce risk of falls? Yes   ASSISTIVE DEVICES UTILIZED TO PREVENT FALLS: Life alert? Yes  Use of a cane, walker or w/c? Yes , walker  TIMED UP AND  GO: Was the test performed? No . Virtual   Cognitive Function: MMSE - Mini Mental State Exam 03/30/2016 03/31/2015  Orientation to time 5 5  Orientation to Place 5 5  Registration 3 3  Attention/ Calculation 5 5  Recall 3 3  Language- name 2 objects 2 2  Language- repeat 1 1  Language- follow 3 step command 3 3  Language- read & follow direction 1 1  Write a sentence 1 1  Copy design 1 1  Total score 30 30     6CIT Screen 11/20/2018  What Year? 0 points  What month? 0 points  What time? 0 points  Count back from 20 0 points  Months in reverse 0 points  Repeat phrase 0 points  Total Score 0    Immunizations Immunization History  Administered Date(s) Administered  . Fluad Quad(high Dose 65+) 12/03/2018  . Influenza Split 12/08/2011  . Influenza, High Dose Seasonal PF 11/17/2015, 10/18/2016, 11/27/2017  . Influenza,inj,Quad PF,6+ Mos 10/31/2013, 10/23/2014  . Influenza-Unspecified 11/17/2015  . PFIZER SARS-COV-2 Vaccination 03/04/2019, 04/04/2019, 11/19/2019  . Pneumococcal Conjugate-13 05/07/2013  . Pneumococcal-Unspecified 11/30/2000, 01/04/2006  . Td 11/20/2007  . Tdap 04/15/2018  . Zoster Recombinat (Shingrix) 09/19/2017, 12/28/2017   Health Maintenance Health Maintenance  Topic Date Due  . DEXA SCAN  Never done  . INFLUENZA VACCINE  05/14/2020 (Originally 09/15/2019)  . MAMMOGRAM  12/10/2019  . TETANUS/TDAP  04/14/2028  . COVID-19 Vaccine  Completed  . PNA vac Low Risk Adult  Completed   Dexa Scan- deferred per patient preference.   Dental Screening: Recommended annual dental exams for proper oral hygiene  Community Resource Referral / Chronic Care Management: CRR required this visit?  No   CCM required this visit?  No      Plan:   Keep all routine maintenance appointments.   Follow up 01/31/20 @ 3:00  I have personally reviewed and noted the following in the patient's chart:   . Medical and social history . Use of alcohol, tobacco or illicit  drugs  . Current medications and supplements . Functional ability and status . Nutritional status . Physical activity . Advanced directives . List of other physicians . Hospitalizations, surgeries, and ER visits in previous 12 months . Vitals . Screenings to include cognitive, depression, and falls . Referrals and appointments  In addition, I have reviewed and discussed with patient certain preventive protocols, quality metrics, and best practice recommendations. A written personalized care plan for preventive services as well as general preventive health recommendations were provided to patient via mychart.     Varney Biles, LPN   [id number]     Reviewed above information.  Agree with assessment and plan.    Dr Nicki Reaper

## 2019-11-29 ENCOUNTER — Telehealth: Payer: Self-pay

## 2019-11-29 NOTE — Telephone Encounter (Signed)
Pt's husband called and states that she fell two nights ago and hit her right leg. He states that it keeps weeping clear fluid. I offered appt at 1:00 today with Dr Kelly Services and he states that he could not get her here at that time. No other appts available. Please call to advise.

## 2019-11-29 NOTE — Telephone Encounter (Signed)
Pt's husband called and states that she fell two nights ago and hit her right leg. He states that it keeps weeping clear fluid. I offered appt at 1:00 today with Dr Kelly Services and he states that he could not get her here at that time. No other appts available. Please advise.  Dawnelle Warman,cma

## 2019-11-29 NOTE — Telephone Encounter (Signed)
I called the patient and spoke with her husband and he informed me about her fall and the oozing of the place on her leg, I informed him that per Dr. Nicki Reaper she needs to be evaluated in urgent care and he understood and stated he would take her there.  [name], cma

## 2019-11-29 NOTE — Telephone Encounter (Signed)
Given she fell and given - oozing and persistent change - needs to be evaluated.  Would recommend urgent care today.

## 2019-12-18 ENCOUNTER — Ambulatory Visit: Payer: Medicare HMO | Admitting: Dermatology

## 2019-12-18 ENCOUNTER — Telehealth: Payer: Self-pay | Admitting: Internal Medicine

## 2019-12-18 ENCOUNTER — Other Ambulatory Visit: Payer: Self-pay

## 2019-12-18 DIAGNOSIS — I872 Venous insufficiency (chronic) (peripheral): Secondary | ICD-10-CM

## 2019-12-18 DIAGNOSIS — L820 Inflamed seborrheic keratosis: Secondary | ICD-10-CM

## 2019-12-18 NOTE — Telephone Encounter (Signed)
Patient's husband called in stated that wife legs are swollen wanted to know if Dr.Scott could prescription her something for fluid for her legs please. Contact husband at [phone]

## 2019-12-18 NOTE — Telephone Encounter (Signed)
Anyway they can schedule appt to discuss.

## 2019-12-18 NOTE — Patient Instructions (Addendum)
Stasis in the legs causes chronic leg swelling, which may result in itchy or painful rashes, skin discoloration, skin texture changes, and sometimes ulceration.  Recommend daily compression hose/stockings- easiest to put on first thing in morning, remove at bedtime.  Elevate legs as much as possible. Avoid salt/sodium rich foods.  Recommend switching from compression knee-highs to thigh-high or full hose. Can be measured at [facility].  Cryotherapy Aftercare   Wash gently with soap and water everyday.    Apply Vaseline and Band-Aid daily until healed.

## 2019-12-18 NOTE — Progress Notes (Signed)
° °  Follow-Up Visit   Subjective  [name] is a 86 y.o. female who presents for the following: Spots (face, left thigh. Some are irritating.).  The following portions of the chart were reviewed this encounter and updated as appropriate:      Review of Systems:  No other skin or systemic complaints except as noted in HPI or Assessment and Plan.  Objective  Well appearing patient in no apparent distress; mood and affect are within normal limits.  A focused examination was performed including face, legs. Relevant physical exam findings are noted in the Assessment and Plan.  Objective  R cheek x 3, R eyebrow x 1 (4): Erythematous keratotic or waxy stuck-on papule   Objective  Lower legs: BL legs- compression hose on. Erythema, scale and pitting edema extending above knee to thigh   Assessment & Plan  Inflamed seborrheic keratosis (4) R cheek x 3, R eyebrow x 1  Destruction of lesion - R cheek x 3, R eyebrow x 1  Destruction method: cryotherapy   Destruction method comment:  Electrodessication Informed consent: discussed and consent obtained   Lesion destroyed using liquid nitrogen: Yes   Region frozen until ice ball extended beyond lesion: Yes   Outcome: patient tolerated procedure well with no complications   Post-procedure details: wound care instructions given   Additional details:  Prior to procedure, discussed risks of blister formation, small wound, skin dyspigmentation, or rare scar following cryotherapy.  Venous stasis dermatitis of left lower extremity Lower legs  Recommend switching from compression knee highs to thigh-high or full hose.  Rec. [facility] to get measured. Discussed topical steroid. Patient defers today, not itchy.  Recommend patient talk to PCP about going on diuretic due to swelling.  [place] Eczema, Eucerin Eczema - samples given.  Stasis in the legs causes chronic leg swelling, which may result in itchy or painful rashes, skin  discoloration, skin texture changes, and sometimes ulceration.  Recommend daily compression hose/stockings- easiest to put on first thing in morning, remove at bedtime.  Elevate legs as much as possible. Avoid salt/sodium rich foods.   Return as scheduled with Dr Raliegh Ip..   I, Jamesetta Orleans, CMA, am acting as scribe for Brendolyn Patty, MD .  Documentation: I have reviewed the above documentation for accuracy and completeness, and I agree with the above.  Brendolyn Patty MD

## 2019-12-18 NOTE — Telephone Encounter (Signed)
Patient saw dermatology today. Derm recommended patient calling her PCP to get a fluid pill because she has some pitting edema in her lower extremities. Also advised [facility] some thigh high compression hose. Confirmed that pt is not having any acute symptoms. Advised to keep legs elevated as much as possible. Also advised that Dr Nicki Reaper is not in the office this afternoon. Patient and husband were ok to wait for a call back until tomorrow.

## 2019-12-19 NOTE — Telephone Encounter (Signed)
LMTCB

## 2019-12-20 NOTE — Telephone Encounter (Signed)
Pt scheduled  

## 2019-12-24 ENCOUNTER — Ambulatory Visit (INDEPENDENT_AMBULATORY_CARE_PROVIDER_SITE_OTHER): Payer: Medicare HMO | Admitting: Internal Medicine

## 2019-12-24 ENCOUNTER — Other Ambulatory Visit: Payer: Self-pay

## 2019-12-24 VITALS — BP 130/70 | HR 81 | Temp 98.5°F | Resp 16 | Ht 62.0 in | Wt 163.0 lb

## 2019-12-24 DIAGNOSIS — E7800 Pure hypercholesterolemia, unspecified: Secondary | ICD-10-CM

## 2019-12-24 DIAGNOSIS — W19XXXA Unspecified fall, initial encounter: Secondary | ICD-10-CM

## 2019-12-24 DIAGNOSIS — I10 Essential (primary) hypertension: Secondary | ICD-10-CM | POA: Diagnosis not present

## 2019-12-24 DIAGNOSIS — R600 Localized edema: Secondary | ICD-10-CM | POA: Diagnosis not present

## 2019-12-24 DIAGNOSIS — D509 Iron deficiency anemia, unspecified: Secondary | ICD-10-CM | POA: Diagnosis not present

## 2019-12-24 DIAGNOSIS — M5136 Other intervertebral disc degeneration, lumbar region: Secondary | ICD-10-CM | POA: Diagnosis not present

## 2019-12-24 DIAGNOSIS — G8929 Other chronic pain: Secondary | ICD-10-CM

## 2019-12-24 DIAGNOSIS — R739 Hyperglycemia, unspecified: Secondary | ICD-10-CM | POA: Diagnosis not present

## 2019-12-24 DIAGNOSIS — M7062 Trochanteric bursitis, left hip: Secondary | ICD-10-CM | POA: Diagnosis not present

## 2019-12-24 DIAGNOSIS — M545 Low back pain, unspecified: Secondary | ICD-10-CM

## 2019-12-24 DIAGNOSIS — M5416 Radiculopathy, lumbar region: Secondary | ICD-10-CM | POA: Diagnosis not present

## 2019-12-24 DIAGNOSIS — M62830 Muscle spasm of back: Secondary | ICD-10-CM | POA: Diagnosis not present

## 2019-12-24 LAB — LIPID PANEL
Cholesterol: 149 mg/dL (ref 0–200)
HDL: 67.8 mg/dL (ref >39.00)
LDL Cholesterol: 56 mg/dL (ref 0–99)
NonHDL: 80.84
Total CHOL/HDL Ratio: 2
Triglycerides: 126 mg/dL (ref 0.0–149.0)
VLDL: 25.2 mg/dL (ref 0.0–40.0)

## 2019-12-24 LAB — HEPATIC FUNCTION PANEL
ALT: 10 U/L (ref 0–35)
AST: 16 U/L (ref 0–37)
Albumin: 4.6 g/dL (ref 3.5–5.2)
Alkaline Phosphatase: 61 U/L (ref 39–117)
Bilirubin, Direct: 0.3 mg/dL (ref 0.0–0.3)
Total Bilirubin: 1.6 mg/dL — ABNORMAL HIGH (ref 0.2–1.2)
Total Protein: 6.9 g/dL (ref 6.0–8.3)

## 2019-12-24 LAB — CBC WITH DIFFERENTIAL/PLATELET
Basophils Absolute: 0 10*3/uL (ref 0.0–0.1)
Basophils Relative: 0.5 % (ref 0.0–3.0)
Eosinophils Absolute: 0.4 10*3/uL (ref 0.0–0.7)
Eosinophils Relative: 5.4 % — ABNORMAL HIGH (ref 0.0–5.0)
HCT: 41.2 % (ref 36.0–46.0)
Hemoglobin: 13.8 g/dL (ref 12.0–15.0)
Lymphocytes Relative: 24 % (ref 12.0–46.0)
Lymphs Abs: 1.7 10*3/uL (ref 0.7–4.0)
MCHC: 33.5 g/dL (ref 30.0–36.0)
MCV: 94.7 fl (ref 78.0–100.0)
Monocytes Absolute: 0.5 10*3/uL (ref 0.1–1.0)
Monocytes Relative: 7.4 % (ref 3.0–12.0)
Neutro Abs: 4.5 10*3/uL (ref 1.4–7.7)
Neutrophils Relative %: 62.7 % (ref 43.0–77.0)
Platelets: 275 10*3/uL (ref 150.0–400.0)
RBC: 4.34 Mil/uL (ref 3.87–5.11)
RDW: 13.9 % (ref 11.5–15.5)
WBC: 7.1 10*3/uL (ref 4.0–10.5)

## 2019-12-24 LAB — BASIC METABOLIC PANEL
BUN: 20 mg/dL (ref 6–23)
CO2: 26 mEq/L (ref 19–32)
Calcium: 9.7 mg/dL (ref 8.4–10.5)
Chloride: 102 mEq/L (ref 96–112)
Creatinine, Ser: 1.03 mg/dL (ref 0.40–1.20)
GFR: 48.42 mL/min — ABNORMAL LOW (ref >60.00)
Glucose, Bld: 99 mg/dL (ref 70–99)
Potassium: 4.1 mEq/L (ref 3.5–5.1)
Sodium: 138 mEq/L (ref 135–145)

## 2019-12-24 LAB — HEMOGLOBIN A1C: Hgb A1c MFr Bld: 5.6 % (ref 4.6–6.5)

## 2019-12-24 LAB — TSH: TSH: 2.86 u[IU]/mL (ref 0.35–4.50)

## 2019-12-24 MED ORDER — AMLODIPINE BESYLATE 10 MG PO TABS
ORAL_TABLET | ORAL | 1 refills | Status: DC
Start: 2019-12-24 — End: 2019-12-25

## 2019-12-24 MED ORDER — FUROSEMIDE 20 MG PO TABS
20.0000 mg | ORAL_TABLET | Freq: Every day | ORAL | 0 refills | Status: DC | PRN
Start: 2019-12-24 — End: 2020-01-15

## 2019-12-24 NOTE — Progress Notes (Signed)
Subjective:    Patient ID: [name], female    DOB: 12-24-1928, 86 y.o.   MRN: [id number]  HPI This visit occurred during the SARS-CoV-2 public health emergency.  Safety protocols were in place, including screening questions prior to the visit, additional usage of staff PPE, and extensive cleaning of exam room while observing appropriate contact time as indicated for disinfecting solutions.  Patient here for work in appt.  Worked in for evaluation - lower extremity swelling.  She is accompanied by her husband.  History obtained from both of them.  Also recently fell.  Slipped out of her lift chair.  Hit the left side of her face and her right leg.  No headache.  No dizziness or lightheadedness.  Hematoma resolving.  Increased lower extremity swelling.  Wearing compression hose.  No chest pain or increased sob.  Eating.  No change in diet.  Discussed leg elevation.  No abdominal pain.  Bowels moving.   Past Medical History:  Diagnosis Date  . Anemia   . Barrett's esophagus   . Bowel obstruction (HCC)    s/p adhesion resection  . Chronic back pain   . Colon cancer ([place]) 1988 and 1989   adenocarcinoma, s/p resection x  2 and chemo  . Diverticulitis   . GERD (gastroesophageal reflux disease)   . H/O open leg wound   . Hiatal hernia   . Hypercholesteremia   . Hypertension   . Lumbar scoliosis   . OA (osteoarthritis)   . Peripheral neuropathy   . Recurrent sinus infections   . Renal cyst   . S/P chemotherapy, time since greater than 12 weeks    colon cancer  . Vertigo    Past Surgical History:  Procedure Laterality Date  . ABDOMINAL HYSTERECTOMY  1982  . adhesions resected     bowel obstruction  . APPENDECTOMY    . BREAST BIOPSY Left    negative 06/14/1985  . CHOLECYSTECTOMY  1994  . COLON RESECTION     x2.  s/p colon cancer  . [place]  . EXCISIONAL HEMORRHOIDECTOMY     with tubal ligation  . EXPLORATORY LAPAROTOMY  1991    Secondary to SBO   Family History  Problem Relation Age of Onset  . Breast cancer Mother   . Asthma Mother   . Stroke Father   . Hypertension Father   . Diabetes Father   . Ovarian cancer Sister        x2  . Prostate cancer Brother   . Hypertension Brother        x3  . Heart disease Brother   . Hypercholesterolemia Brother        x3  . Diabetes Brother   . Spina bifida Grandchild   . Hematuria Neg Hx   . Kidney cancer Neg Hx   . Kidney disease Neg Hx   . Sickle cell trait Neg Hx   . Tuberculosis Neg Hx    Social History   Socioeconomic History  . Marital status: Married    Spouse name: Not on file  . Number of children: 4  . Years of education: 12th grade  . Highest education level: Not on file  Occupational History  . Occupation: homemaker  Tobacco Use  . Smoking status: Never Smoker  . Smokeless tobacco: Never Used  Vaping Use  . Vaping Use: Never used  Substance and Sexual Activity  . Alcohol use: No  Alcohol/week: 0.0 standard drinks  . Drug use: No  . Sexual activity: Never  Other Topics Concern  . Not on file  Social History Narrative  . Not on file   Social Determinants of Health   Financial Resource Strain: Low Risk   . Difficulty of Paying Living Expenses: Not hard at all  Food Insecurity: No Food Insecurity  . Worried About Charity fundraiser in the Last Year: Never true  . Ran Out of Food in the Last Year: Never true  Transportation Needs: No Transportation Needs  . Lack of Transportation (Medical): No  . Lack of Transportation (Non-Medical): No  Physical Activity:   . Days of Exercise per Week: Not on file  . Minutes of Exercise per Session: Not on file  Stress: No Stress Concern Present  . Feeling of Stress : Not at all  Social Connections: Unknown  . Frequency of Communication with Friends and Family: Not on file  . Frequency of Social Gatherings with Friends and Family: Not on file  . Attends Religious Services: Not on file  .  Active Member of Clubs or Organizations: Not on file  . Attends Archivist Meetings: Not on file  . Marital Status: Married    Outpatient Encounter Medications as of 12/24/2019  Medication Sig  . albuterol (VENTOLIN HFA) 108 (90 Base) MCG/ACT inhaler TAKE 2 PUFFS BY MOUTH EVERY 6 HOURS AS NEEDED FOR WHEEZE OR SHORTNESS OF BREATH  . fexofenadine (ALLEGRA) 60 MG tablet TAKE 1 TABLET EVERY DAY  . FLUAD QUADRIVALENT 0.5 ML injection   . furosemide (LASIX) 20 MG tablet Take 1 tablet (20 mg total) by mouth daily as needed.  . gabapentin (NEURONTIN) 100 MG capsule TAKE 2 CAPSULES AT BEDTIME  . irbesartan (AVAPRO) 300 MG tablet TAKE 1 TABLET EVERY DAY  . meclizine (ANTIVERT) 12.5 MG tablet Take 1 tablet (12.5 mg total) by mouth 2 (two) times daily as needed for dizziness.  . meloxicam (MOBIC) 7.5 MG tablet   . metoCLOPramide (REGLAN) 5 MG tablet Take 5 mg by mouth 2 (two) times daily.  . metoprolol succinate (TOPROL-XL) 25 MG 24 hr tablet TAKE 1 TABLET EVERY DAY  . ondansetron (ZOFRAN ODT) 4 MG disintegrating tablet Take 1 tablet (4 mg total) by mouth 2 (two) times daily as needed for nausea or vomiting.  Marland Kitchen oxybutynin (DITROPAN-XL) 10 MG 24 hr tablet Take 1 tablet (10 mg total) by mouth daily.  Marland Kitchen oxyCODONE (OXY IR/ROXICODONE) 5 MG immediate release tablet Take 5 mg by mouth 3 (three) times daily.   . pantoprazole (PROTONIX) 40 MG tablet TAKE 1 TABLET TWICE DAILY  . simvastatin (ZOCOR) 10 MG tablet TAKE 1 TABLET (10 MG TOTAL) BY MOUTH AT BEDTIME.  Marland Kitchen Spacer/[facility] DIAMOND) MISC   . traMADol-acetaminophen (ULTRACET) 37.5-325 MG per tablet Take 1 tablet by mouth 2 (two) times daily as needed.   . [DISCONTINUED] amLODipine (NORVASC) 10 MG tablet TAKE 1 TABLET EVERY DAY  . [DISCONTINUED] amLODipine (NORVASC) 10 MG tablet Take 1/2 tablet q day.  . [DISCONTINUED] furosemide (LASIX) 20 MG tablet Take 1 tablet (20 mg total) by mouth daily.   No facility-administered  encounter medications on file as of 12/24/2019.    Review of Systems  Constitutional: Negative for appetite change and unexpected weight change.  HENT: Negative for congestion and sinus pressure.   Respiratory: Negative for cough, chest tightness and shortness of breath.   Cardiovascular: Positive for leg swelling. Negative for chest pain and palpitations.  Gastrointestinal: Negative for abdominal pain, diarrhea, nausea and vomiting.  Genitourinary: Negative for difficulty urinating and dysuria.  Musculoskeletal: Negative for joint swelling and myalgias.  Skin: Negative for rash.       Resolving hematoma - left face.    Neurological: Negative for dizziness, light-headedness and headaches.  Psychiatric/Behavioral: Negative for agitation and dysphoric mood.       Objective:    Physical Exam Vitals reviewed.  Constitutional:      General: She is not in acute distress.    Appearance: Normal appearance.  HENT:     Head: Normocephalic and atraumatic.     Comments: Hematoma - left side face. Minimal tenderness left orbit.      Right Ear: External ear normal.     Left Ear: External ear normal.  Eyes:     General: No scleral icterus.       Right eye: No discharge.        Left eye: No discharge.     Conjunctiva/sclera: Conjunctivae normal.  Neck:     Thyroid: No thyromegaly.  Cardiovascular:     Rate and Rhythm: Normal rate and regular rhythm.  Pulmonary:     Effort: No respiratory distress.     Breath sounds: Normal breath sounds. No wheezing.  Abdominal:     General: Bowel sounds are normal.     Palpations: Abdomen is soft.     Tenderness: There is no abdominal tenderness.  Musculoskeletal:        General: No tenderness.     Cervical back: Neck supple. No tenderness.     Comments: Increased lower extremity swelling - above knee.  Pitting.  No increased erythema.  Some stasis changes.    Lymphadenopathy:     Cervical: No cervical adenopathy.  Skin:    Findings: No erythema  or rash.  Neurological:     Mental Status: She is alert.  Psychiatric:        Mood and Affect: Mood normal.        Behavior: Behavior normal.     BP 130/70   Pulse 81   Temp 98.5 F (36.9 C) (Oral)   Resp 16   Wt 163 lb (73.9 kg)   SpO2 97%   BMI 29.81 kg/m  Wt Readings from Last 3 Encounters:  12/24/19 163 lb (73.9 kg)  11/26/19 154 lb (69.9 kg)  10/29/19 154 lb (69.9 kg)     Lab Results  Component Value Date   WBC 7.1 12/24/2019   HGB 13.8 12/24/2019   HCT 41.2 12/24/2019   PLT 275.0 12/24/2019   GLUCOSE 99 12/24/2019   CHOL 149 12/24/2019   TRIG 126.0 12/24/2019   HDL 67.80 12/24/2019   LDLCALC 56 12/24/2019   ALT 10 12/24/2019   AST 16 12/24/2019   NA 138 12/24/2019   K 4.1 12/24/2019   CL 102 12/24/2019   CREATININE 1.03 12/24/2019   BUN 20 12/24/2019   CO2 26 12/24/2019   TSH 2.86 12/24/2019   INR 0.91 06/11/2015   HGBA1C 5.6 12/24/2019       Assessment & Plan:   Problem List Items Addressed This Visit    Hypertension - Primary    Blood pressure doing better.  On amlodipine.  Need to confirm dose.  Supposed to be on 5mg  q day.  Husband will call back with correct dose.  Continue on avapro and metoprolol.  Lasix x 2 days as directed.  Follow.  Check metabolic panel.  Relevant Medications   furosemide (LASIX) 20 MG tablet   Other Relevant Orders   CBC with Differential/Platelet (Completed)   TSH (Completed)   Basic metabolic panel (Completed)   Hypercholesteremia   Relevant Medications   furosemide (LASIX) 20 MG tablet   Other Relevant Orders   Hepatic function panel (Completed)   Lipid panel (Completed)   Fall    Recent fall. Slipped out of her chair.  Resolving hematoma left face.  Right leg - resolving.  Follow.        Chronic back pain    Followed by Dr Sharlet Salina.        Bilateral lower extremity edema    Increased lower extremity swelling as outlined.  No change in diet.  Continue compression hose.  Leg elevation.  Check  metabolic panel, cbc and tsh.  Lasix q day x 2 days.  Call with update.  Follow.        Anemia, iron deficiency    Follow cbc.  Recheck to confirm hgb stable.         Other Visit Diagnoses    Hyperglycemia       Relevant Orders   Hemoglobin A1c (Completed)       [name], MD

## 2019-12-25 ENCOUNTER — Other Ambulatory Visit: Payer: Self-pay | Admitting: Internal Medicine

## 2019-12-25 ENCOUNTER — Telehealth: Payer: Self-pay | Admitting: Internal Medicine

## 2019-12-25 DIAGNOSIS — R944 Abnormal results of kidney function studies: Secondary | ICD-10-CM

## 2019-12-25 MED ORDER — AMLODIPINE BESYLATE 5 MG PO TABS: 5.0000 mg | ORAL_TABLET | Freq: Every day | ORAL | 3 refills | Status: DC

## 2019-12-25 NOTE — Addendum Note (Signed)
Addended by: Alisa Graff on: 12/25/2019 02:31 PM   Modules accepted: Orders

## 2019-12-25 NOTE — Telephone Encounter (Signed)
Ok to send this in? 

## 2019-12-25 NOTE — Telephone Encounter (Signed)
Pt husband called to let Dr. Nicki Reaper know that they are cutting the amLODipine (NORVASC) 10 MG tablet in half but are wanting to get an rx for the 5mg  sent to [facility] mail order so they dont have to cut them

## 2019-12-25 NOTE — Progress Notes (Signed)
Order placed for f/u labs.  

## 2019-12-25 NOTE — Telephone Encounter (Signed)
Noted  

## 2019-12-25 NOTE — Telephone Encounter (Signed)
rx sent in to [facility] for amlodpine 5mg  q day.  Please see result note.

## 2019-12-26 NOTE — Telephone Encounter (Signed)
Patient's husband called to let Larena Glassman know that patient's legs are still swollen about 50%. Patient needs to know if she should continue on medication.

## 2019-12-26 NOTE — Telephone Encounter (Signed)
She did 2 days of lasix and comes in to have her labs rechecked 11/16

## 2019-12-26 NOTE — Telephone Encounter (Signed)
Have them do one more day of lasix.  Monitor blood pressure.  Will recheck kidney function at scheduled appt.

## 2019-12-26 NOTE — Telephone Encounter (Signed)
Left detailed message for patient.

## 2019-12-29 ENCOUNTER — Encounter: Payer: Self-pay | Admitting: Internal Medicine

## 2019-12-29 DIAGNOSIS — W19XXXA Unspecified fall, initial encounter: Secondary | ICD-10-CM | POA: Insufficient documentation

## 2019-12-29 NOTE — Assessment & Plan Note (Signed)
Followed by Dr Sharlet Salina.

## 2019-12-29 NOTE — Assessment & Plan Note (Addendum)
Blood pressure doing better.  On amlodipine.  Need to confirm dose.  Supposed to be on 5mg  q day.  Husband will call back with correct dose.  Continue on avapro and metoprolol.  Lasix x 2 days as directed.  Follow.  Check metabolic panel.

## 2019-12-29 NOTE — Assessment & Plan Note (Signed)
Recent fall. Slipped out of her chair.  Resolving hematoma left face.  Right leg - resolving.  Follow.

## 2019-12-29 NOTE — Assessment & Plan Note (Signed)
Follow cbc.  Recheck to confirm hgb stable.

## 2019-12-29 NOTE — Assessment & Plan Note (Signed)
Increased lower extremity swelling as outlined.  No change in diet.  Continue compression hose.  Leg elevation.  Check metabolic panel, cbc and tsh.  Lasix q day x 2 days.  Call with update.  Follow.

## 2019-12-31 ENCOUNTER — Other Ambulatory Visit (INDEPENDENT_AMBULATORY_CARE_PROVIDER_SITE_OTHER): Payer: Medicare HMO

## 2019-12-31 ENCOUNTER — Other Ambulatory Visit: Payer: Self-pay

## 2019-12-31 DIAGNOSIS — R944 Abnormal results of kidney function studies: Secondary | ICD-10-CM

## 2019-12-31 LAB — BASIC METABOLIC PANEL
BUN: 19 mg/dL (ref 6–23)
CO2: 21 mEq/L (ref 19–32)
Calcium: 10.1 mg/dL (ref 8.4–10.5)
Chloride: 101 mEq/L (ref 96–112)
Creatinine, Ser: 0.85 mg/dL (ref 0.40–1.20)
GFR: 60.96 mL/min (ref >60.00)
Glucose, Bld: 109 mg/dL — ABNORMAL HIGH (ref 70–99)
Potassium: 4.7 mEq/L (ref 3.5–5.1)
Sodium: 135 mEq/L (ref 135–145)

## 2019-12-31 LAB — HEPATIC FUNCTION PANEL
ALT: 12 U/L (ref 0–35)
AST: 21 U/L (ref 0–37)
Albumin: 4.9 g/dL (ref 3.5–5.2)
Alkaline Phosphatase: 69 U/L (ref 39–117)
Bilirubin, Direct: 0.2 mg/dL (ref 0.0–0.3)
Total Bilirubin: 1.6 mg/dL — ABNORMAL HIGH (ref 0.2–1.2)
Total Protein: 7.6 g/dL (ref 6.0–8.3)

## 2020-01-03 ENCOUNTER — Other Ambulatory Visit: Payer: Self-pay | Admitting: Internal Medicine

## 2020-01-03 NOTE — Progress Notes (Signed)
Order placed for abdominal ultrasound.   

## 2020-01-06 ENCOUNTER — Telehealth: Payer: Self-pay | Admitting: Internal Medicine

## 2020-01-06 ENCOUNTER — Other Ambulatory Visit: Payer: Self-pay | Admitting: Internal Medicine

## 2020-01-06 MED ORDER — METOPROLOL SUCCINATE ER 25 MG PO TB24
25.0000 mg | ORAL_TABLET | Freq: Two times a day (BID) | ORAL | 1 refills | Status: DC
Start: 2020-01-06 — End: 2020-05-18

## 2020-01-06 NOTE — Telephone Encounter (Signed)
Rx sent in for toprol XL bid #[phone] with one refill.

## 2020-01-06 NOTE — Telephone Encounter (Signed)
Pt husband called and said that Dr. Nicki Reaper increased her metoprolol succinate (TOPROL-XL) 25 MG 24 hr tablet to twice a day and they need a new Rx sent to [facility]

## 2020-01-06 NOTE — Progress Notes (Signed)
rx sent in for toprol xl bid #[id number] with 1 refill

## 2020-01-08 ENCOUNTER — Ambulatory Visit
Admission: RE | Admit: 2020-01-08 | Discharge: 2020-01-08 | Disposition: A | Discharge status: Home / Self Care | Payer: Medicare HMO | Source: Ambulatory Visit | Attending: Internal Medicine | Admitting: Internal Medicine

## 2020-01-08 ENCOUNTER — Other Ambulatory Visit: Payer: Self-pay

## 2020-01-08 ENCOUNTER — Telehealth: Payer: Self-pay | Admitting: Internal Medicine

## 2020-01-08 DIAGNOSIS — Z1231 Encounter for screening mammogram for malignant neoplasm of breast: Secondary | ICD-10-CM | POA: Diagnosis not present

## 2020-01-08 DIAGNOSIS — K838 Other specified diseases of biliary tract: Secondary | ICD-10-CM | POA: Diagnosis not present

## 2020-01-08 DIAGNOSIS — D7389 Other diseases of spleen: Secondary | ICD-10-CM | POA: Diagnosis not present

## 2020-01-08 DIAGNOSIS — N281 Cyst of kidney, acquired: Secondary | ICD-10-CM | POA: Diagnosis not present

## 2020-01-08 DIAGNOSIS — K8689 Other specified diseases of pancreas: Secondary | ICD-10-CM | POA: Diagnosis not present

## 2020-01-08 NOTE — Telephone Encounter (Signed)
Patient's husband called in to check on refill for metoprolol succinate (TOPROL-XL) 25 MG 24 hr tablet

## 2020-01-15 ENCOUNTER — Other Ambulatory Visit: Payer: Self-pay | Admitting: Internal Medicine

## 2020-01-15 ENCOUNTER — Telehealth: Payer: Self-pay

## 2020-01-15 NOTE — Telephone Encounter (Signed)
I called patient at both numbers LM on cell to call back for recommendations on BP.

## 2020-01-20 ENCOUNTER — Other Ambulatory Visit: Payer: Self-pay | Admitting: Internal Medicine

## 2020-01-30 DIAGNOSIS — K3184 Gastroparesis: Secondary | ICD-10-CM | POA: Diagnosis not present

## 2020-01-30 DIAGNOSIS — Z85038 Personal history of other malignant neoplasm of large intestine: Secondary | ICD-10-CM | POA: Diagnosis not present

## 2020-01-30 DIAGNOSIS — K219 Gastro-esophageal reflux disease without esophagitis: Secondary | ICD-10-CM | POA: Diagnosis not present

## 2020-01-30 DIAGNOSIS — K5909 Other constipation: Secondary | ICD-10-CM | POA: Diagnosis not present

## 2020-01-30 DIAGNOSIS — Z8719 Personal history of other diseases of the digestive system: Secondary | ICD-10-CM | POA: Diagnosis not present

## 2020-01-31 ENCOUNTER — Other Ambulatory Visit: Payer: Self-pay

## 2020-01-31 ENCOUNTER — Ambulatory Visit (INDEPENDENT_AMBULATORY_CARE_PROVIDER_SITE_OTHER): Payer: Medicare HMO | Admitting: Internal Medicine

## 2020-01-31 VITALS — BP 132/70 | HR 75 | Temp 98.4°F | Resp 16 | Ht 62.0 in | Wt 158.0 lb

## 2020-01-31 DIAGNOSIS — K22719 Barrett's esophagus with dysplasia, unspecified: Secondary | ICD-10-CM | POA: Diagnosis not present

## 2020-01-31 DIAGNOSIS — E7800 Pure hypercholesterolemia, unspecified: Secondary | ICD-10-CM

## 2020-01-31 DIAGNOSIS — Z85038 Personal history of other malignant neoplasm of large intestine: Secondary | ICD-10-CM | POA: Diagnosis not present

## 2020-01-31 DIAGNOSIS — I10 Essential (primary) hypertension: Secondary | ICD-10-CM

## 2020-01-31 DIAGNOSIS — G2581 Restless legs syndrome: Secondary | ICD-10-CM | POA: Diagnosis not present

## 2020-01-31 DIAGNOSIS — Z0000 Encounter for general adult medical examination without abnormal findings: Secondary | ICD-10-CM

## 2020-01-31 DIAGNOSIS — I739 Peripheral vascular disease, unspecified: Secondary | ICD-10-CM | POA: Diagnosis not present

## 2020-01-31 DIAGNOSIS — R600 Localized edema: Secondary | ICD-10-CM

## 2020-01-31 DIAGNOSIS — M545 Low back pain, unspecified: Secondary | ICD-10-CM

## 2020-01-31 DIAGNOSIS — D509 Iron deficiency anemia, unspecified: Secondary | ICD-10-CM

## 2020-01-31 DIAGNOSIS — G8929 Other chronic pain: Secondary | ICD-10-CM

## 2020-01-31 NOTE — Progress Notes (Signed)
Patient ID: [name], female   DOB: 02/10/1929, 86 y.o.   MRN: [id number]   Subjective:    Patient ID: [name], female    DOB: [birth year], 86 y.o.   MRN: [id number]  HPI This visit occurred during the SARS-CoV-2 public health emergency.  Safety protocols were in place, including screening questions prior to the visit, additional usage of staff PPE, and extensive cleaning of exam room while observing appropriate contact time as indicated for disinfecting solutions.  Patient here for her physical exam.  She is accompanied by her husband.  History obtained from both of them.  She reports she is doing better. Blood pressure better.  Reviewed outside readings. Lower extremity swelling better.  Uses a walker to ambulate.  Able to get around her house and do ADLs.  No chest pain or sob reported.  No abdominal pain or bowel change reported.  Saw GI yesterday.  Stable.  No changes made.    Past Medical History:  Diagnosis Date  . Anemia   . Barrett's esophagus   . Bowel obstruction (HCC)    s/p adhesion resection  . Chronic back pain   . Colon cancer ([place]) 1988 and 1989   adenocarcinoma, s/p resection x  2 and chemo  . Diverticulitis   . GERD (gastroesophageal reflux disease)   . H/O open leg wound   . Hiatal hernia   . Hypercholesteremia   . Hypertension   . Lumbar scoliosis   . OA (osteoarthritis)   . Peripheral neuropathy   . Recurrent sinus infections   . Renal cyst   . S/P chemotherapy, time since greater than 12 weeks    colon cancer  . Vertigo    Past Surgical History:  Procedure Laterality Date  . ABDOMINAL HYSTERECTOMY  1982  . adhesions resected     bowel obstruction  . APPENDECTOMY    . BREAST BIOPSY Left    negative 06/14/1985  . CHOLECYSTECTOMY  1994  . COLON RESECTION     x2.  s/p colon cancer  . [place]  . EXCISIONAL HEMORRHOIDECTOMY     with tubal ligation  . EXPLORATORY LAPAROTOMY  1991   Secondary to SBO    Family History  Problem Relation Age of Onset  . Breast cancer Mother   . Asthma Mother   . Stroke Father   . Hypertension Father   . Diabetes Father   . Ovarian cancer Sister        x2  . Prostate cancer Brother   . Hypertension Brother        x3  . Heart disease Brother   . Hypercholesterolemia Brother        x3  . Diabetes Brother   . Spina bifida Grandchild   . Hematuria Neg Hx   . Kidney cancer Neg Hx   . Kidney disease Neg Hx   . Sickle cell trait Neg Hx   . Tuberculosis Neg Hx    Social History   Socioeconomic History  . Marital status: Married    Spouse name: Not on file  . Number of children: 4  . Years of education: 12th grade  . Highest education level: Not on file  Occupational History  . Occupation: homemaker  Tobacco Use  . Smoking status: Never Smoker  . Smokeless tobacco: Never Used  Vaping Use  . Vaping Use: Never used  Substance and Sexual Activity  . Alcohol use: No  Alcohol/week: 0.0 standard drinks  . Drug use: No  . Sexual activity: Never  Other Topics Concern  . Not on file  Social History Narrative  . Not on file   Social Determinants of Health   Financial Resource Strain: Low Risk   . Difficulty of Paying Living Expenses: Not hard at all  Food Insecurity: No Food Insecurity  . Worried About Charity fundraiser in the Last Year: Never true  . Ran Out of Food in the Last Year: Never true  Transportation Needs: No Transportation Needs  . Lack of Transportation (Medical): No  . Lack of Transportation (Non-Medical): No  Physical Activity: Not on file  Stress: No Stress Concern Present  . Feeling of Stress : Not at all  Social Connections: Unknown  . Frequency of Communication with Friends and Family: Not on file  . Frequency of Social Gatherings with Friends and Family: Not on file  . Attends Religious Services: Not on file  . Active Member of Clubs or Organizations: Not on file  . Attends Archivist Meetings: Not  on file  . Marital Status: Married    Outpatient Encounter Medications as of 01/31/2020  Medication Sig  . albuterol (VENTOLIN HFA) 108 (90 Base) MCG/ACT inhaler TAKE 2 PUFFS BY MOUTH EVERY 6 HOURS AS NEEDED FOR WHEEZE OR SHORTNESS OF BREATH  . amLODipine (NORVASC) 5 MG tablet Take 1 tablet (5 mg total) by mouth daily.  . fexofenadine (ALLEGRA) 60 MG tablet TAKE 1 TABLET EVERY DAY  . FLUAD QUADRIVALENT 0.5 ML injection   . furosemide (LASIX) 20 MG tablet TAKE 1 TABLET BY MOUTH EVERY DAY AS NEEDED  . gabapentin (NEURONTIN) 100 MG capsule TAKE 2 CAPSULES AT BEDTIME  . irbesartan (AVAPRO) 300 MG tablet TAKE 1 TABLET EVERY DAY  . meclizine (ANTIVERT) 12.5 MG tablet Take 1 tablet (12.5 mg total) by mouth 2 (two) times daily as needed for dizziness.  . meloxicam (MOBIC) 7.5 MG tablet   . metoCLOPramide (REGLAN) 5 MG tablet Take 5 mg by mouth 2 (two) times daily.  . metoprolol succinate (TOPROL-XL) 25 MG 24 hr tablet Take 1 tablet (25 mg total) by mouth 2 (two) times daily.  . ondansetron (ZOFRAN ODT) 4 MG disintegrating tablet Take 1 tablet (4 mg total) by mouth 2 (two) times daily as needed for nausea or vomiting.  Marland Kitchen oxybutynin (DITROPAN-XL) 10 MG 24 hr tablet Take 1 tablet (10 mg total) by mouth daily.  Marland Kitchen oxyCODONE (OXY IR/ROXICODONE) 5 MG immediate release tablet Take 5 mg by mouth 3 (three) times daily.   . pantoprazole (PROTONIX) 40 MG tablet TAKE 1 TABLET TWICE DAILY  . simvastatin (ZOCOR) 10 MG tablet TAKE 1 TABLET (10 MG TOTAL) BY MOUTH AT BEDTIME.  Marland Kitchen Spacer/Aero-Holding Chambers [facility] DIAMOND) MISC   . traMADol-acetaminophen (ULTRACET) 37.5-325 MG per tablet Take 1 tablet by mouth 2 (two) times daily as needed.    No facility-administered encounter medications on file as of 01/31/2020.    Review of Systems  Constitutional: Negative for appetite change and unexpected weight change.  HENT: Negative for congestion, sinus pressure and sore throat.   Eyes: Negative for pain and  visual disturbance.  Respiratory: Negative for cough, chest tightness and shortness of breath.   Cardiovascular: Negative for chest pain, palpitations and leg swelling.  Gastrointestinal: Negative for abdominal pain, diarrhea, nausea and vomiting.  Genitourinary: Negative for difficulty urinating and dysuria.  Musculoskeletal: Negative for joint swelling and myalgias.  Skin: Negative for color change  and rash.  Neurological: Negative for dizziness, light-headedness and headaches.  Hematological: Negative for adenopathy. Does not bruise/bleed easily.  Psychiatric/Behavioral: Negative for agitation and dysphoric mood.       Objective:    Physical Exam Vitals reviewed.  Constitutional:      General: She is not in acute distress.    Appearance: Normal appearance. She is well-developed and well-nourished.  HENT:     Head: Normocephalic and atraumatic.     Right Ear: External ear normal.     Left Ear: External ear normal.     Mouth/Throat:     Mouth: Oropharynx is clear and moist.  Eyes:     General: No scleral icterus.       Right eye: No discharge.        Left eye: No discharge.     Conjunctiva/sclera: Conjunctivae normal.  Neck:     Thyroid: No thyromegaly.  Cardiovascular:     Rate and Rhythm: Normal rate and regular rhythm.  Pulmonary:     Effort: No tachypnea, accessory muscle usage or respiratory distress.     Breath sounds: Normal breath sounds. No decreased breath sounds or wheezing.  Chest:  Breasts:     Right: No inverted nipple, mass, nipple discharge or tenderness (no axillary adenopathy).     Left: No inverted nipple, mass, nipple discharge or tenderness (no axilarry adenopathy).    Abdominal:     General: Bowel sounds are normal.     Palpations: Abdomen is soft.     Tenderness: There is no abdominal tenderness.  Musculoskeletal:        General: No swelling, tenderness or edema.     Cervical back: Neck supple. No tenderness.  Lymphadenopathy:     Cervical:  No cervical adenopathy.  Skin:    Findings: No erythema or rash.  Neurological:     Mental Status: She is alert and oriented to person, place, and time.  Psychiatric:        Mood and Affect: Mood and affect and mood normal.        Behavior: Behavior normal.     BP 132/70   Pulse 75   Temp 98.4 F (36.9 C) (Oral)   Resp 16   Ht 5\' 2"  (1.575 m)   Wt 158 lb (71.7 kg)   SpO2 97%   BMI 28.90 kg/m  Wt Readings from Last 3 Encounters:  01/31/20 158 lb (71.7 kg)  12/24/19 163 lb (73.9 kg)  11/26/19 154 lb (69.9 kg)     Lab Results  Component Value Date   WBC 7.1 12/24/2019   HGB 13.8 12/24/2019   HCT 41.2 12/24/2019   PLT 275.0 12/24/2019   GLUCOSE 109 (H) 12/31/2019   CHOL 149 12/24/2019   TRIG 126.0 12/24/2019   HDL 67.80 12/24/2019   LDLCALC 56 12/24/2019   ALT 12 12/31/2019   AST 21 12/31/2019   NA 135 12/31/2019   K 4.7 12/31/2019   CL 101 12/31/2019   CREATININE 0.85 12/31/2019   BUN 19 12/31/2019   CO2 21 12/31/2019   TSH 2.86 12/24/2019   INR 0.91 06/11/2015   HGBA1C 5.6 12/24/2019    US Abdomen Complete  Result Date: 01/08/2020 CLINICAL DATA:  Hyperbilirubinemia EXAM: ABDOMEN ULTRASOUND COMPLETE COMPARISON:  CT scan of the abdomen 06/07/2018 FINDINGS: Gallbladder: Surgically absent. Common bile duct: Diameter: 8 mm. Common bile duct diameter was 11 mm on CT 06/07/2018. Liver: Heterogeneously increased echotexture without a discrete focal abnormality. No substantial intrahepatic biliary duct  dilatation. Portal vein is patent on color Doppler imaging with normal direction of blood flow towards the liver. IVC: No abnormality visualized. Pancreas: Head and body of pancreas unremarkable. Tail not well seen. Main pancreatic duct measures 4 mm diameter in the head of pancreas. This is similar to prior CT. Spleen: Calcified granulomata. Right Kidney: Length: 9 cm. Echogenicity within normal limits. No mass or hydronephrosis visualized. Left Kidney: Length: 9.4 cm.  Upper pole cyst measures up to 4 cm, similar to prior CT scan. Abdominal aorta: No aneurysm Other findings: None. IMPRESSION: 1. Common bile duct diameter upper normal for stated patient age at 8 mm. Diameter measures slightly less than previous CT of 06/07/2018. 2. Left upper pole renal cyst. 3. Prominence of the main pancreatic duct similar to prior CT. 4. Calcified granulomata in the spleen. Electronically Signed   By: Misty Stanley M.D.   On: 01/08/2020 16:34   MM 3D SCREEN BREAST BILATERAL  Result Date: 01/12/2020 CLINICAL DATA:  Screening. EXAM: DIGITAL SCREENING BILATERAL MAMMOGRAM WITH TOMO AND CAD COMPARISON:  Previous exam(s). ACR Breast Density Category b: There are scattered areas of fibroglandular density. FINDINGS: There are no findings suspicious for malignancy. Images were processed with CAD. IMPRESSION: No mammographic evidence of malignancy. A result letter of this screening mammogram will be mailed directly to the patient. RECOMMENDATION: Screening mammogram in one year. (Code:SM-B-01Y) BI-RADS CATEGORY  1: Negative. Electronically Signed   By: Lajean Manes M.D.   On: 01/12/2020 15:19       Assessment & Plan:   Problem List Items Addressed This Visit    Hypertension    Blood pressure doing better.  On amlodipine, metoprolol and avapro.  Blood pressure on my check 132/68.  Continue current medication.  Follow pressures.  Follow metabolic panel.        Hypercholesteremia    On simvastatin.  Low cholesterol diet and exercise.  Follow lipid panel and liver function tests.        History of colon cancer    Just saw GI yesterday.  Bowels stable.       Barrett's esophagus    Just saw GI yesterday.  Note reviewed.  Recommended continuing protonix and reglan.  Currently doing well.  Follow.        Chronic back pain    Followed by Dr Sharlet Salina.  Uses walker to ambulate.       Anemia, iron deficiency    Follow cbc.       Health care maintenance    Physical today 01/31/20.   Mammogram 01/12/20 - Birads I.  Colonoscopy 2012.  Informed by GI, no further colonoscopy warranted.        Hyperbilirubinemia    Recent abdominal ultrasound as outlined.  Saw GI yesterday.  No further w/up felt warranted.  Currently without symptoms.  Follow liver panel.        Bilateral lower extremity edema    Swelling much improved.  Doing well.  Follow.        Hereditary hemochromatosis ([place])    Previously evaluated by hematology.  Has declined further w/up.  Follow cbc and iron studies.        PAD (peripheral artery disease) (HCC)    Previously evaluated by [facility].  Continue risk factor modification.       Restless legs syndrome    No problems reported.  Continue gabapentin.        Other Visit Diagnoses    Routine general medical examination at a health care  facility    -  Primary       [name], MD

## 2020-02-01 ENCOUNTER — Encounter: Payer: Self-pay | Admitting: Internal Medicine

## 2020-02-01 NOTE — Assessment & Plan Note (Signed)
Just saw GI yesterday.  Bowels stable.

## 2020-02-01 NOTE — Assessment & Plan Note (Signed)
Just saw GI yesterday.  Note reviewed.  Recommended continuing protonix and reglan.  Currently doing well.  Follow.

## 2020-02-01 NOTE — Assessment & Plan Note (Signed)
No problems reported.  Continue gabapentin.

## 2020-02-01 NOTE — Assessment & Plan Note (Signed)
On simvastatin.  Low cholesterol diet and exercise.  Follow lipid panel and liver function tests.

## 2020-02-01 NOTE — Assessment & Plan Note (Signed)
Blood pressure doing better.  On amlodipine, metoprolol and avapro.  Blood pressure on my check 132/68.  Continue current medication.  Follow pressures.  Follow metabolic panel.

## 2020-02-01 NOTE — Assessment & Plan Note (Signed)
Recent abdominal ultrasound as outlined.  Saw GI yesterday.  No further w/up felt warranted.  Currently without symptoms.  Follow liver panel.

## 2020-02-01 NOTE — Assessment & Plan Note (Signed)
Previously evaluated by [facility].  Continue risk factor modification.   

## 2020-02-01 NOTE — Assessment & Plan Note (Signed)
Swelling much improved.  Doing well.  Follow.

## 2020-02-01 NOTE — Assessment & Plan Note (Signed)
Follow cbc.  

## 2020-02-01 NOTE — Assessment & Plan Note (Signed)
Followed by Dr Sharlet Salina.  Uses walker to ambulate.

## 2020-02-01 NOTE — Assessment & Plan Note (Signed)
Physical today 01/31/20.  Mammogram 01/12/20 - Birads I.  Colonoscopy 2012.  Informed by GI, no further colonoscopy warranted.

## 2020-02-01 NOTE — Assessment & Plan Note (Signed)
Previously evaluated by hematology.  Has declined further w/up.  Follow cbc and iron studies.

## 2020-02-04 ENCOUNTER — Telehealth: Payer: Self-pay | Admitting: Internal Medicine

## 2020-02-04 MED ORDER — NYSTATIN 100000 UNIT/GM EX CREA: 1.0000 "application " | TOPICAL_CREAM | Freq: Two times a day (BID) | CUTANEOUS | 0 refills | Status: DC

## 2020-02-04 NOTE — Telephone Encounter (Signed)
Patient is aware 

## 2020-02-04 NOTE — Telephone Encounter (Signed)
Nystatin cream sent in.  Sorry for the delay.

## 2020-02-04 NOTE — Telephone Encounter (Signed)
Patient's husband called in about prescription for cream for rash stated that [facility] has not received it yet

## 2020-02-04 NOTE — Telephone Encounter (Signed)
Is this something that was discussed at her last visit?  

## 2020-02-11 ENCOUNTER — Other Ambulatory Visit: Payer: Self-pay | Admitting: Internal Medicine

## 2020-02-18 ENCOUNTER — Telehealth: Payer: Self-pay | Admitting: Internal Medicine

## 2020-02-18 NOTE — Telephone Encounter (Signed)
Patient's husband wanted to let Dr.Scott know that patient's legs are swelling again, it has been going on for a week and seems to be getting worse.

## 2020-02-18 NOTE — Telephone Encounter (Signed)
LMTCB

## 2020-02-19 ENCOUNTER — Other Ambulatory Visit: Payer: Self-pay | Admitting: Internal Medicine

## 2020-02-19 NOTE — Telephone Encounter (Signed)
Appt scheduled for tomorrow at 9:30

## 2020-02-19 NOTE — Telephone Encounter (Signed)
Husband returned call to let me know that for the last few days patient legs have been swollen. More left than right. Swelling from the knee down. She is using compression hose and keeping legs elevated. Denies SOB, chest tightness, or any other acute symptoms. Husband says that her left leg has a little bit of redness but no more than normal when they are swollen and says it is not warm to the touch. He wanted to know if a couple days of the lasix would help her. Advised that you are only in office this AM with a full schedule. Did not want to see another provider.   BP readings; 134/72 p- 49 148/90 p- 57 157/95 p- 61 145/76 p-55

## 2020-02-19 NOTE — Telephone Encounter (Signed)
Ok to schedule appt if needs.

## 2020-02-20 ENCOUNTER — Ambulatory Visit
Admission: RE | Admit: 2020-02-20 | Discharge: 2020-02-20 | Disposition: A | Discharge status: Home / Self Care | Payer: Medicare HMO | Source: Ambulatory Visit | Attending: Internal Medicine | Admitting: Internal Medicine

## 2020-02-20 ENCOUNTER — Telehealth (INDEPENDENT_AMBULATORY_CARE_PROVIDER_SITE_OTHER): Payer: Medicare HMO | Admitting: Internal Medicine

## 2020-02-20 ENCOUNTER — Other Ambulatory Visit: Payer: Self-pay

## 2020-02-20 VITALS — BP 138/80 | Ht 62.0 in | Wt 158.0 lb

## 2020-02-20 DIAGNOSIS — I10 Essential (primary) hypertension: Secondary | ICD-10-CM | POA: Diagnosis not present

## 2020-02-20 DIAGNOSIS — M7989 Other specified soft tissue disorders: Secondary | ICD-10-CM

## 2020-02-20 DIAGNOSIS — R600 Localized edema: Secondary | ICD-10-CM | POA: Diagnosis not present

## 2020-02-20 NOTE — Progress Notes (Signed)
Patient ID: [name], female   DOB: [birth year], 87 y.o.   MRN: [id number]   Virtual Visit via video Note  This visit type was conducted due to national recommendations for restrictions regarding the COVID-19 pandemic (e.g. social distancing).  This format is felt to be most appropriate for this patient at this time.  All issues noted in this document were discussed and addressed.  No physical exam was performed (except for noted visual exam findings with Video Visits).   I connected with [name] Monday by a video enabled telemedicine application and verified that I am speaking with the correct person using two identifiers. Location patient: home Location provider: work  Persons participating in the virtual visit: patient, provider  The limitations, risks, security and privacy concerns of performing an evaluation and management service by video and the availability of in person appointments have been discussed.  it has also been discussed with the patient that there may be a patient responsible charge related to this service. The patient expressed understanding and agreed to proceed.   Reason for visit: work in appt  HPI: Work in appt with concerns regarding leg swelling - especially left leg.  Recently evaluated for leg swelling.  Was given lasix for a few days.  Swelling improved.  Was wearing compression hose.  Husband has not been able to get hose on recently.  Noticed some increased swelling.  Denies pain.  Blood pressure overall has been doing better.  States when she elevates her leg - improves some.  Did attend a funeral beginning of this week.  Rode in a care.  Some increased swelling after this.  Breathing stable.  No chest pain reported.     ROS: See pertinent positives and negatives per HPI.  Past Medical History:  Diagnosis Date  . Anemia   . Barrett's esophagus   . Bowel obstruction (HCC)    s/p adhesion resection  . Chronic back pain   . Colon cancer (HCC) 1988 and 1989    adenocarcinoma, s/p resection x  2 and chemo  . Diverticulitis   . GERD (gastroesophageal reflux disease)   . H/O open leg wound   . Hiatal hernia   . Hypercholesteremia   . Hypertension   . Lumbar scoliosis   . OA (osteoarthritis)   . Peripheral neuropathy   . Recurrent sinus infections   . Renal cyst   . S/P chemotherapy, time since greater than 12 weeks    colon cancer  . Vertigo     Past Surgical History:  Procedure Laterality Date  . ABDOMINAL HYSTERECTOMY  1982  . adhesions resected     bowel obstruction  . APPENDECTOMY    . BREAST BIOPSY Left    negative 06/14/1985  . CHOLECYSTECTOMY  1994  . COLON RESECTION     x2.  s/p colon cancer  . COLON SURGERY  1958 and 1989   For Colon Cancer  . EXCISIONAL HEMORRHOIDECTOMY     with tubal ligation  . EXPLORATORY LAPAROTOMY  1991   Secondary to SBO    Family History  Problem Relation Age of Onset  . Breast cancer Mother   . Asthma Mother   . Stroke Father   . Hypertension Father   . Diabetes Father   . Ovarian cancer Sister        x2  . Prostate cancer Brother   . Hypertension Brother        x3  . Heart disease Brother   . Hypercholesterolemia  Brother        x3  . Diabetes Brother   . Spina bifida Grandchild   . Hematuria Neg Hx   . Kidney cancer Neg Hx   . Kidney disease Neg Hx   . Sickle cell trait Neg Hx   . Tuberculosis Neg Hx     SOCIAL HX: reviewed.    Current Outpatient Medications:  .  albuterol (VENTOLIN HFA) 108 (90 Base) MCG/ACT inhaler, TAKE 2 PUFFS BY MOUTH EVERY 6 HOURS AS NEEDED FOR WHEEZE OR SHORTNESS OF BREATH, Disp: 18 g, Rfl: 1 .  amLODipine (NORVASC) 5 MG tablet, Take 1 tablet (5 mg total) by mouth daily., Disp: 90 tablet, Rfl: 3 .  fexofenadine (ALLEGRA) 60 MG tablet, TAKE 1 TABLET EVERY DAY, Disp: 90 tablet, Rfl: 3 .  FLUAD QUADRIVALENT 0.5 ML injection, , Disp: , Rfl:  .  furosemide (LASIX) 20 MG tablet, TAKE 1 TABLET BY MOUTH EVERY DAY AS NEEDED, Disp: 90 tablet, Rfl: 1 .   gabapentin (NEURONTIN) 100 MG capsule, TAKE 2 CAPSULES AT BEDTIME, Disp: 180 capsule, Rfl: 1 .  irbesartan (AVAPRO) 300 MG tablet, TAKE 1 TABLET EVERY DAY, Disp: 90 tablet, Rfl: 1 .  meclizine (ANTIVERT) 12.5 MG tablet, Take 1 tablet (12.5 mg total) by mouth 2 (two) times daily as needed for dizziness., Disp: 20 tablet, Rfl: 0 .  meloxicam (MOBIC) 7.5 MG tablet, , Disp: , Rfl:  .  metoCLOPramide (REGLAN) 5 MG tablet, Take 5 mg by mouth 2 (two) times daily., Disp: , Rfl:  .  metoprolol succinate (TOPROL-XL) 25 MG 24 hr tablet, Take 1 tablet (25 mg total) by mouth 2 (two) times daily., Disp: 180 tablet, Rfl: 1 .  nystatin cream (MYCOSTATIN), Apply 1 application topically 2 (two) times daily., Disp: 30 g, Rfl: 0 .  ondansetron (ZOFRAN ODT) 4 MG disintegrating tablet, Take 1 tablet (4 mg total) by mouth 2 (two) times daily as needed for nausea or vomiting., Disp: 20 tablet, Rfl: 0 .  oxybutynin (DITROPAN-XL) 10 MG 24 hr tablet, Take 1 tablet (10 mg total) by mouth daily., Disp: 90 tablet, Rfl: 3 .  oxyCODONE (OXY IR/ROXICODONE) 5 MG immediate release tablet, Take 5 mg by mouth 3 (three) times daily. , Disp: , Rfl:  .  pantoprazole (PROTONIX) 40 MG tablet, TAKE 1 TABLET TWICE DAILY, Disp: 180 tablet, Rfl: 0 .  simvastatin (ZOCOR) 10 MG tablet, TAKE 1 TABLET (10 MG TOTAL) BY MOUTH AT BEDTIME., Disp: 90 tablet, Rfl: 3 .  Spacer/Aero-Holding Chambers (OPTICHAMBER DIAMOND) MISC, , Disp: , Rfl:  .  traMADol-acetaminophen (ULTRACET) 37.5-325 MG per tablet, Take 1 tablet by mouth 2 (two) times daily as needed. , Disp: , Rfl:   EXAM:  VITALS per patient if applicable: 138/80  GENERAL: alert, oriented, appears well and in no acute distress  HEENT: atraumatic, conjunttiva clear, no obvious abnormalities on inspection of external nose and ears  NECK: normal movements of the head and neck  LUNGS: on inspection no signs of respiratory distress, breathing rate appears normal, no obvious gross SOB, gasping or  wheezing  CV: no obvious cyanosis  MS: lower extremity swelling - left leg > right.  No increased erythema.    PSYCH/NEURO: pleasant and cooperative, no obvious depression or anxiety, speech and thought processing grossly intact  ASSESSMENT AND PLAN:  Discussed the following assessment and plan:  Problem List Items Addressed This Visit    Hypertension    Blood pressure overall has been doing better.  Continues on  avapro, amlodipine and metoprolol.  Continue to follow pressures.  Lasix prn as directed.  Follow.       Swelling of left lower extremity - Primary    Lower extremity swelling as outlined.  Left >right.  No increased erythema.  Check lower extremity ultrasound to confirm no DVT present.  Lasix q day x 3 days.  Call Monday with update.  Follow.  Elevate legs.  Restart compression hose.  Monitor salt intake.        Relevant Orders   US Venous Img Lower Unilateral Left (Completed)       I discussed the assessment and treatment plan with the patient. The patient was provided an opportunity to ask questions and all were answered. The patient agreed with the plan and demonstrated an understanding of the instructions.   The patient was advised to call back or seek an in-person evaluation if the symptoms worsen or if the condition fails to improve as anticipated.   [name], MD

## 2020-02-22 ENCOUNTER — Encounter: Payer: Self-pay | Admitting: Internal Medicine

## 2020-02-22 NOTE — Assessment & Plan Note (Signed)
Blood pressure overall has been doing better.  Continues on avapro, amlodipine and metoprolol.  Continue to follow pressures.  Lasix prn as directed.  Follow.

## 2020-02-22 NOTE — Assessment & Plan Note (Signed)
Lower extremity swelling as outlined.  Left >right.  No increased erythema.  Check lower extremity ultrasound to confirm no DVT present.  Lasix q day x 3 days.  Call Monday with update.  Follow.  Elevate legs.  Restart compression hose.  Monitor salt intake.

## 2020-02-24 ENCOUNTER — Telehealth: Payer: Self-pay

## 2020-02-24 NOTE — Telephone Encounter (Signed)
Pt's husband called and states that Dr Nicki Reaper wanted them to follow up from visit from last week. He was calling to report on how pt is doing. Please advise

## 2020-02-24 NOTE — Telephone Encounter (Signed)
Husband was calling to report that patients leg is probably 90% better. Most of the swelling is gone. Nothing needed, just calling to update as requested. Thanked him for the update and advised to let us know if they needed anything

## 2020-03-25 DIAGNOSIS — M62830 Muscle spasm of back: Secondary | ICD-10-CM | POA: Diagnosis not present

## 2020-03-25 DIAGNOSIS — M5136 Other intervertebral disc degeneration, lumbar region: Secondary | ICD-10-CM | POA: Diagnosis not present

## 2020-03-25 DIAGNOSIS — M7062 Trochanteric bursitis, left hip: Secondary | ICD-10-CM | POA: Diagnosis not present

## 2020-03-25 DIAGNOSIS — M48062 Spinal stenosis, lumbar region with neurogenic claudication: Secondary | ICD-10-CM | POA: Diagnosis not present

## 2020-03-25 DIAGNOSIS — M5416 Radiculopathy, lumbar region: Secondary | ICD-10-CM | POA: Diagnosis not present

## 2020-04-09 ENCOUNTER — Other Ambulatory Visit: Payer: Self-pay | Admitting: Internal Medicine

## 2020-04-16 ENCOUNTER — Encounter: Payer: Self-pay | Admitting: Internal Medicine

## 2020-04-16 ENCOUNTER — Telehealth (INDEPENDENT_AMBULATORY_CARE_PROVIDER_SITE_OTHER): Payer: Medicare HMO | Admitting: Internal Medicine

## 2020-04-16 ENCOUNTER — Telehealth: Payer: Self-pay | Admitting: Internal Medicine

## 2020-04-16 ENCOUNTER — Other Ambulatory Visit: Payer: Self-pay

## 2020-04-16 ENCOUNTER — Ambulatory Visit
Admission: RE | Admit: 2020-04-16 | Discharge: 2020-04-16 | Disposition: A | Discharge status: Home / Self Care | Payer: Medicare HMO | Source: Ambulatory Visit | Attending: Internal Medicine | Admitting: Internal Medicine

## 2020-04-16 ENCOUNTER — Ambulatory Visit
Admission: RE | Admit: 2020-04-16 | Discharge: 2020-04-16 | Disposition: A | Discharge status: Home / Self Care | Payer: Medicare HMO | Attending: Internal Medicine | Admitting: Internal Medicine

## 2020-04-16 ENCOUNTER — Other Ambulatory Visit: Payer: Self-pay | Admitting: Internal Medicine

## 2020-04-16 VITALS — BP 136/82 | HR 62 | Ht 62.0 in | Wt 158.0 lb

## 2020-04-16 DIAGNOSIS — R062 Wheezing: Secondary | ICD-10-CM | POA: Diagnosis not present

## 2020-04-16 DIAGNOSIS — R0602 Shortness of breath: Secondary | ICD-10-CM

## 2020-04-16 DIAGNOSIS — I509 Heart failure, unspecified: Secondary | ICD-10-CM

## 2020-04-16 DIAGNOSIS — J90 Pleural effusion, not elsewhere classified: Secondary | ICD-10-CM | POA: Diagnosis not present

## 2020-04-16 DIAGNOSIS — K449 Diaphragmatic hernia without obstruction or gangrene: Secondary | ICD-10-CM | POA: Diagnosis not present

## 2020-04-16 DIAGNOSIS — R600 Localized edema: Secondary | ICD-10-CM

## 2020-04-16 DIAGNOSIS — J9801 Acute bronchospasm: Secondary | ICD-10-CM | POA: Diagnosis not present

## 2020-04-16 MED ORDER — FUROSEMIDE 40 MG PO TABS: 40.0000 mg | ORAL_TABLET | Freq: Every day | ORAL | 0 refills | Status: DC

## 2020-04-16 NOTE — Telephone Encounter (Signed)
Referred stat Dr. Ubaldo Glassing

## 2020-04-16 NOTE — Telephone Encounter (Signed)
Access nurse called patient having worsening SOB had not been using inhaler started back on Monday. Denies fever , chills  or body aches,  Using the albuterol 3 times daily, SOB worsens with movement. Patient says she has had this before, refuses UC scheduled Virtual with Dr. Aundra Dubin - Jacklynn Lewis and advised patient symptoms worse before appointment to go to the ER or 911. Patient speaking in full sentences could hear some inspiratory wheezing when patient would take deep breath. Ask patient if she had a way to check 02 sats she advised no. Patient appointment scheduled at 2 virtual Dr. Aundra Dubin.

## 2020-04-16 NOTE — Telephone Encounter (Signed)
Patient's husband called and stated patient has shortness of breathe when moving. Patient was transferred over to [facility]urse per Larena Glassman.

## 2020-04-16 NOTE — Telephone Encounter (Signed)
lft vm for [facility] to call ofc or pt to sch echo. thanks

## 2020-04-16 NOTE — Progress Notes (Signed)
Shortness of breath onset of this Monday. States this has happened before but not this bad. States it feels like she can not catch a full breath.   No history of asthma, COVID-19, or COPD. No cold or flu like symptoms.

## 2020-04-16 NOTE — Telephone Encounter (Signed)
Noted will see today.    

## 2020-04-16 NOTE — Progress Notes (Signed)
Virtual Visit via Video Note  I connected with [name]  on 04/16/20 at  2:20 PM EST by a video enabled telemedicine application and verified that I am speaking with the correct person using two identifiers.  Location patient: home, [place] Location provider:work or home office Persons participating in the virtual visit: patient, provider, husband and grandaughter Kailin  I discussed the limitations of evaluation and management by telemedicine and the availability of in person appointments. The patient expressed understanding and agreed to proceed.   HPI: Sob worse since Monday with walking 20-25 steps to the bathroom sleeps in recliner on lasix 20 mg qd prn CXR abnormal b/l small effusions, CHF changes, hiatal hernia   ROS: See pertinent positives and negatives per HPI.  Past Medical History:  Diagnosis Date  . Anemia   . Barrett's esophagus   . Bowel obstruction (HCC)    s/p adhesion resection  . Chronic back pain   . Colon cancer ([place]) 1988 and 1989   adenocarcinoma, s/p resection x  2 and chemo  . Diverticulitis   . GERD (gastroesophageal reflux disease)   . H/O open leg wound   . Hiatal hernia   . Hypercholesteremia   . Hypertension   . Lumbar scoliosis   . OA (osteoarthritis)   . Peripheral neuropathy   . Recurrent sinus infections   . Renal cyst   . S/P chemotherapy, time since greater than 12 weeks    colon cancer  . Vertigo     Past Surgical History:  Procedure Laterality Date  . ABDOMINAL HYSTERECTOMY  1982  . adhesions resected     bowel obstruction  . APPENDECTOMY    . BREAST BIOPSY Left    negative 06/14/1985  . CHOLECYSTECTOMY  1994  . COLON RESECTION     x2.  s/p colon cancer  . [place]  . EXCISIONAL HEMORRHOIDECTOMY     with tubal ligation  . EXPLORATORY LAPAROTOMY  1991   Secondary to SBO     Current Outpatient Medications:  .  albuterol (VENTOLIN HFA) 108 (90 Base) MCG/ACT inhaler, TAKE 2 PUFFS BY  MOUTH EVERY 6 HOURS AS NEEDED FOR WHEEZE OR SHORTNESS OF BREATH, Disp: 18 g, Rfl: 1 .  amLODipine (NORVASC) 5 MG tablet, Take 1 tablet (5 mg total) by mouth daily., Disp: 90 tablet, Rfl: 3 .  calcium carbonate (OSCAL) 1500 (600 Ca) MG TABS tablet, Take 600 mg of elemental calcium by mouth 2 (two) times daily with a meal., Disp: , Rfl:  .  Cholecalciferol (VITAMIN D3) 50 MCG (2000 UT) capsule, Take 2,000 Units by mouth daily., Disp: , Rfl:  .  docusate sodium (COLACE) 100 MG capsule, Take 100 mg by mouth 2 (two) times daily., Disp: , Rfl:  .  fexofenadine (ALLEGRA) 60 MG tablet, TAKE 1 TABLET EVERY DAY, Disp: 90 tablet, Rfl: 3 .  furosemide (LASIX) 40 MG tablet, Take 1 tablet (40 mg total) by mouth daily., Disp: 90 tablet, Rfl: 0 .  gabapentin (NEURONTIN) 100 MG capsule, TAKE 2 CAPSULES AT BEDTIME, Disp: 180 capsule, Rfl: 1 .  irbesartan (AVAPRO) 300 MG tablet, TAKE 1 TABLET EVERY DAY, Disp: 90 tablet, Rfl: 1 .  lisinopril-hydrochlorothiazide (ZESTORETIC) 10-12.5 MG tablet, Take 1 tablet by mouth daily., Disp: , Rfl:  .  losartan (COZAAR) 50 MG tablet, Take 50 mg by mouth daily., Disp: , Rfl:  .  meclizine (ANTIVERT) 12.5 MG tablet, Take 1 tablet (12.5 mg total)  by mouth 2 (two) times daily as needed for dizziness., Disp: 20 tablet, Rfl: 0 .  meloxicam (MOBIC) 7.5 MG tablet, , Disp: , Rfl:  .  metoCLOPramide (REGLAN) 5 MG tablet, Take 5 mg by mouth 2 (two) times daily., Disp: , Rfl:  .  metoprolol succinate (TOPROL-XL) 25 MG 24 hr tablet, Take 1 tablet (25 mg total) by mouth 2 (two) times daily., Disp: 180 tablet, Rfl: 1 .  ondansetron (ZOFRAN ODT) 4 MG disintegrating tablet, Take 1 tablet (4 mg total) by mouth 2 (two) times daily as needed for nausea or vomiting., Disp: 20 tablet, Rfl: 0 .  oxybutynin (DITROPAN-XL) 10 MG 24 hr tablet, Take 1 tablet (10 mg total) by mouth daily., Disp: 90 tablet, Rfl: 3 .  oxyCODONE (OXY IR/ROXICODONE) 5 MG immediate release tablet, Take 5 mg by mouth 3 (three) times  daily. , Disp: , Rfl:  .  pantoprazole (PROTONIX) 40 MG tablet, TAKE 1 TABLET TWICE DAILY, Disp: 180 tablet, Rfl: 0 .  simvastatin (ZOCOR) 10 MG tablet, TAKE 1 TABLET (10 MG TOTAL) BY MOUTH AT BEDTIME., Disp: 90 tablet, Rfl: 3 .  Spacer/Aero-Holding Chambers (OPTICHAMBER DIAMOND) MISC, , Disp: , Rfl:  .  traMADol-acetaminophen (ULTRACET) 37.5-325 MG per tablet, Take 1 tablet by mouth 2 (two) times daily as needed. , Disp: , Rfl:  .  nystatin cream (MYCOSTATIN), Apply 1 application topically 2 (two) times daily. (Patient not taking: Reported on 04/16/2020), Disp: 30 g, Rfl: 0  EXAM:  VITALS per patient if applicable:  GENERAL: alert, oriented, appears well and in no acute distress  HEENT: atraumatic, conjunttiva clear, no obvious abnormalities on inspection of external nose and ears  NECK: normal movements of the head and neck  LUNGS: on inspection no signs of respiratory distress, breathing rate appears normal, no obvious gross SOB, gasping or wheezing  CV: no obvious cyanosis  MS: moves all visible extremities without noticeable abnormality  PSYCH/NEURO: pleasant and cooperative, no obvious depression or anxiety, speech and thought processing grossly intact  In recliner today  ASSESSMENT AND PLAN:  Discussed the following assessment and plan:  SOB (shortness of breath)  With leg edema, CXR c/w CHF - Plan: DG Chest 2 View, Ambulatory referral to Cardiology Dr. Ubaldo Glassing stat cards consult with echo  Taking lasix 20 mg qd prn will take 20 mg qd and tomorrow start 40 mg qd until f/u with cards  Congestive heart failure, unspecified HF chronicity, unspecified heart failure type ([place]) - Plan: furosemide (LASIX) 40 MG tablet, Ambulatory referral to Cardiology   -we discussed possible serious and likely etiologies, options for evaluation and workup, limitations of telemedicine visit vs in person visit, treatment, treatment risks and precautions.   I discussed the assessment and treatment  plan with the patient. The patient was provided an opportunity to ask questions and all were answered. The patient agreed with the plan and demonstrated an understanding of the instructions.    Time spent 20 min Delorise Jackson, MD

## 2020-04-16 NOTE — Telephone Encounter (Signed)
Agree with need for evaluation.  She has had some lower extremity swelling and has responded well to lasix (intermittent).  Last phone message from husband, she was doing better.  No sob at that time.  This is a change for her with unclear etiology.  Agree with evaluation today.

## 2020-04-16 NOTE — Telephone Encounter (Signed)
[facility] called about referral for Echo about appointment for patient she stated that she only have two opening for next week left

## 2020-04-17 NOTE — Telephone Encounter (Signed)
I called and scheduled pt. Thank you!

## 2020-04-19 IMAGING — CT CT ABD-PELV W/ CM
2 of 6 series · 16 of 46 positions shown, 18 images · IV contrast (iopamidol)
Comparison: CT abdomen and pelvis August 11, 2016

CLINICAL DATA: Nausea, LEFT lower quadrant pain. History of colon
cancer, appendectomy, cholecystectomy, hysterectomy, diverticulitis.

EXAM:
CT ABDOMEN AND PELVIS WITH CONTRAST
TECHNIQUE: Multidetector CT imaging of the abdomen and pelvis was performed
using the standard protocol following bolus administration of
intravenous contrast.
CONTRAST:  100mL ZEGLYA-EWW IOPAMIDOL (ZEGLYA-EWW) INJECTION 61%

[Series 2: routine abd/pel with · axial · 0.66mm/px · z∈[-1142,-772]mm · 13 of 86 slices shown, 15 images]
[im 6/86  soft-tissue]
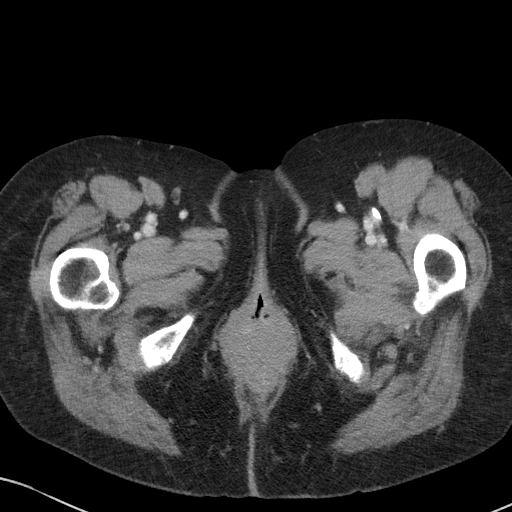
[im 6/86  bone]
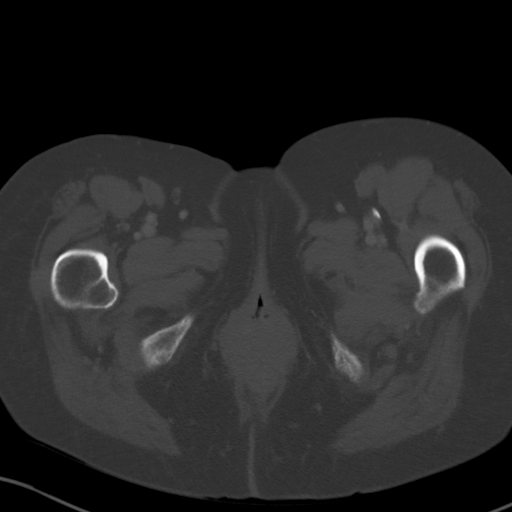
[im 11/86  soft-tissue]
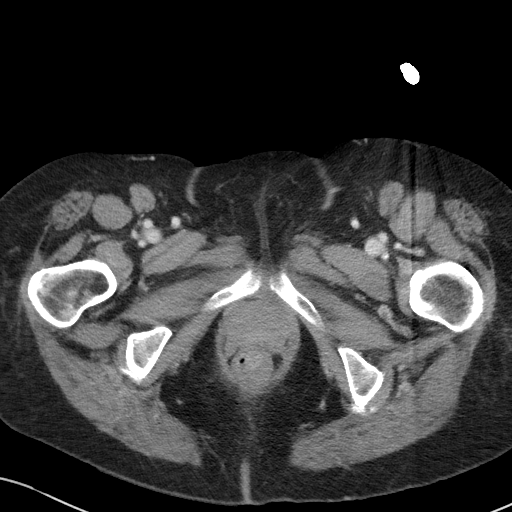
[im 16/86  soft-tissue]
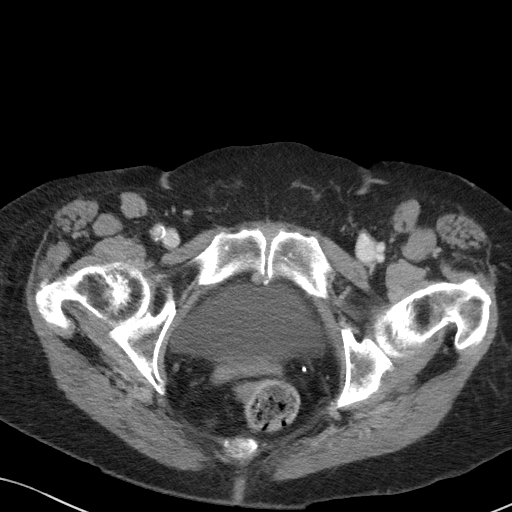
[im 27/86  soft-tissue]
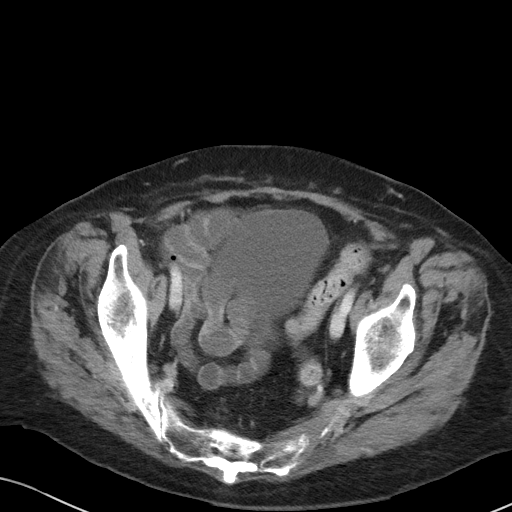
[im 32/86  soft-tissue]
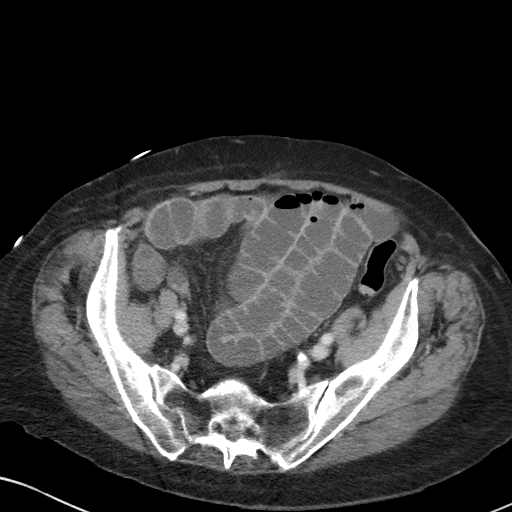
[im 38/86  soft-tissue]
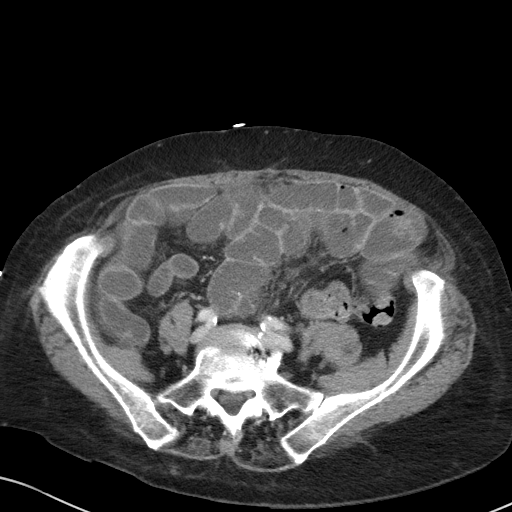
[im 43/86  soft-tissue]
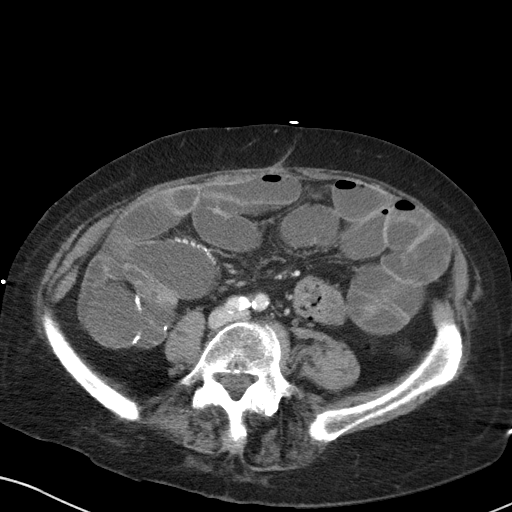
[im 48/86  soft-tissue]
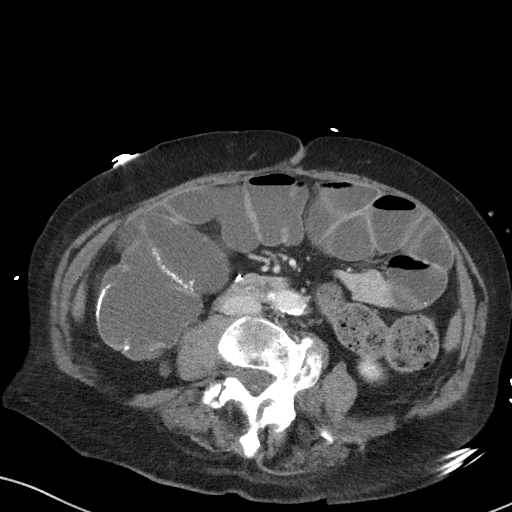
[im 54/86  soft-tissue]
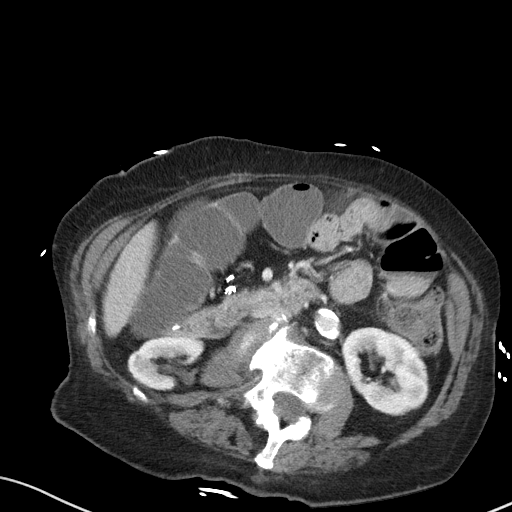
[im 54/86  bone]
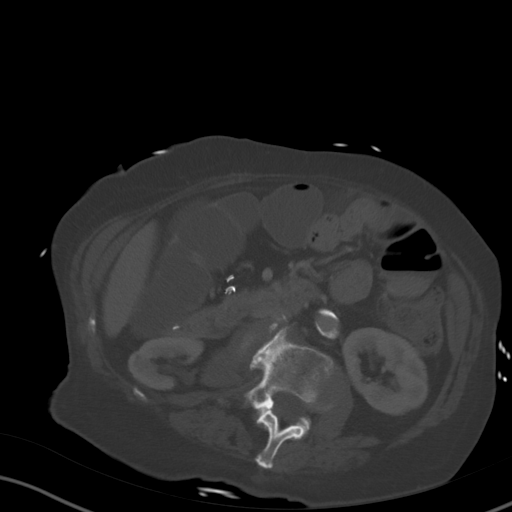
[im 59/86  soft-tissue]
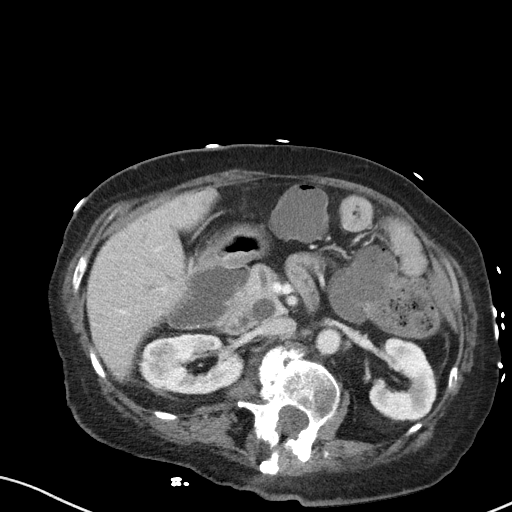
[im 70/86  soft-tissue]
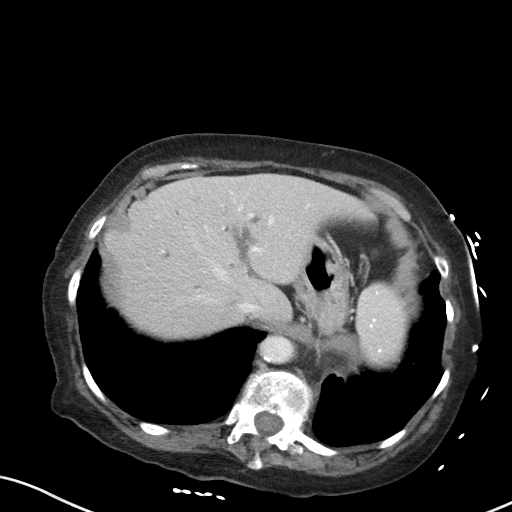
[im 75/86  soft-tissue]
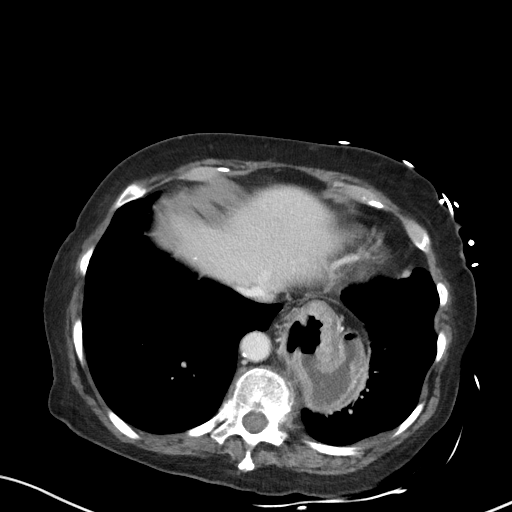
[im 80/86  soft-tissue]
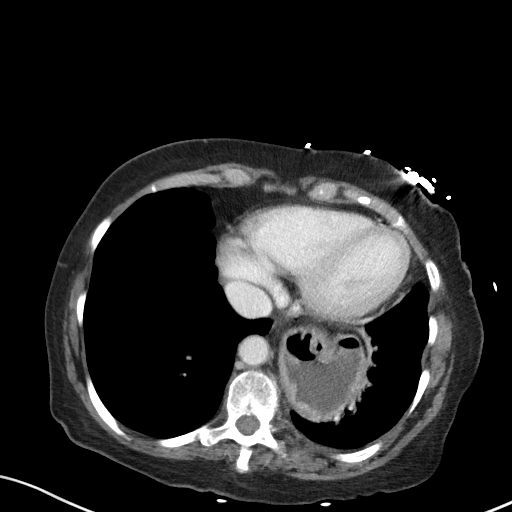

[Series 5: coronal st · coronal · 0.68mm/px · 3 of 81 slices shown]
[im 27/81  soft-tissue]
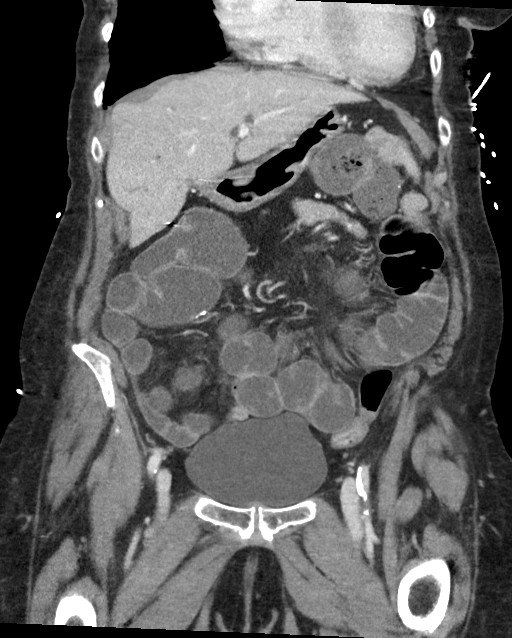
[im 36/81  soft-tissue]
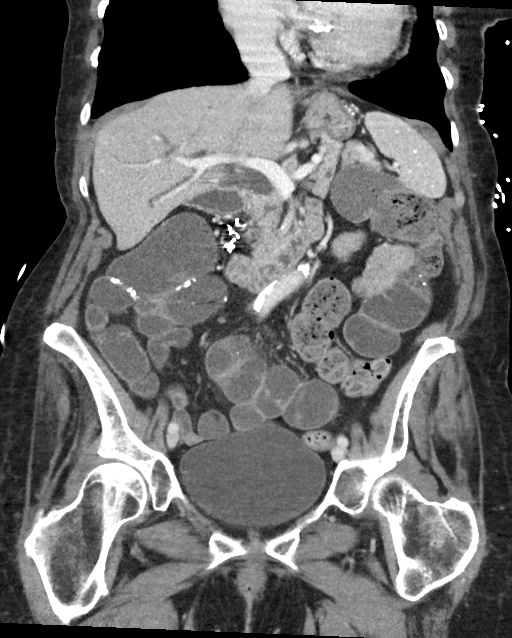
[im 45/81  soft-tissue]
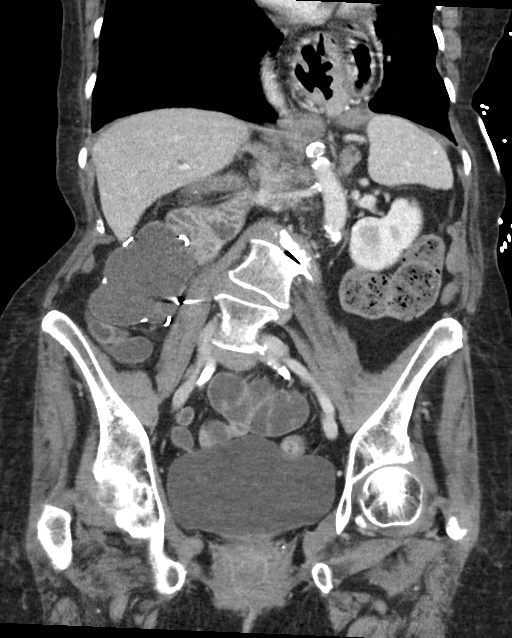

[16 of 46 positions shown; findings below may reference images not displayed]

FINDINGS: LOWER CHEST: Lung bases are clear. Included heart size is mildly
enlarged. No pericardial effusion.

HEPATOBILIARY: Status post cholecystectomy. Similar postoperative
intrahepatic biliary dilatation. A few punctate hepatic granulomas.

PANCREAS: Nonacute.  Punctate splenic calcification.

SPLEEN: Calcified splenic granulomas.  Normal spleen size.

ADRENALS/URINARY TRACT: Kidneys are orthotopic, demonstrating
symmetric enhancement. No nephrolithiasis, hydronephrosis or solid
renal masses. RIGHT interpolar scarring. 4.4 cm homogeneously
hypodense cyst upper pole LEFT kidney with capsular vessel, stable.
Too small to characterize hypodensities bilateral kidneys. The
unopacified ureters are normal in course and caliber. Delayed
imaging through the kidneys demonstrates symmetric prompt contrast
excretion within the proximal urinary collecting system. Urinary
bladder is well distended and unremarkable. Normal adrenal glands.

STOMACH/BOWEL: Moderate hiatal hernia. Status post RIGHT
hemicolectomy. Fluid-filled colon 25.7 cm with gradual transition to
stool-filled descending and sigmoid colon. Fluid-filled small bowel
dilated to 3.3 cm. Known transition point. No bowel wall thickening
or inflammatory changes.

VASCULAR/LYMPHATIC: Aortoiliac vessels are caliber, tortuous course.
Moderate calcific atherosclerosis. No lymphadenopathy by CT size
criteria.

REPRODUCTIVE: Status post hysterectomy.

OTHER: No intraperitoneal free fluid or free air.

MUSCULOSKELETAL: Nonacute. Severe thoracolumbar levoscoliosis.
Anterior abdominal wall scarring.
IMPRESSION: 1. Fluid distended small and large bowel seen with enteritis and
ileus. No bowel obstruction. Status post RIGHT hemicolectomy.
2. Moderate hiatal hernia.

Aortic Atherosclerosis ([id number]).

## 2020-04-20 ENCOUNTER — Other Ambulatory Visit
Admission: RE | Admit: 2020-04-20 | Discharge: 2020-04-20 | Disposition: A | Discharge status: Home / Self Care | Payer: Medicare HMO | Source: Ambulatory Visit | Attending: Physician Assistant | Admitting: Physician Assistant

## 2020-04-20 ENCOUNTER — Ambulatory Visit: Payer: Medicare HMO | Admitting: Cardiology

## 2020-04-20 DIAGNOSIS — R600 Localized edema: Secondary | ICD-10-CM | POA: Diagnosis not present

## 2020-04-20 DIAGNOSIS — R609 Edema, unspecified: Secondary | ICD-10-CM | POA: Insufficient documentation

## 2020-04-20 DIAGNOSIS — I10 Essential (primary) hypertension: Secondary | ICD-10-CM | POA: Insufficient documentation

## 2020-04-20 DIAGNOSIS — I509 Heart failure, unspecified: Secondary | ICD-10-CM | POA: Insufficient documentation

## 2020-04-20 DIAGNOSIS — I5022 Chronic systolic (congestive) heart failure: Secondary | ICD-10-CM | POA: Diagnosis not present

## 2020-04-20 DIAGNOSIS — R0602 Shortness of breath: Secondary | ICD-10-CM | POA: Insufficient documentation

## 2020-04-20 LAB — BRAIN NATRIURETIC PEPTIDE: B Natriuretic Peptide: 403.4 pg/mL — ABNORMAL HIGH (ref 0.0–100.0)

## 2020-04-22 ENCOUNTER — Ambulatory Visit: Payer: Medicare HMO

## 2020-04-23 ENCOUNTER — Other Ambulatory Visit: Payer: Self-pay

## 2020-04-23 ENCOUNTER — Ambulatory Visit
Admission: RE | Admit: 2020-04-23 | Discharge: 2020-04-23 | Disposition: A | Discharge status: Home / Self Care | Payer: Medicare HMO | Source: Ambulatory Visit | Attending: Internal Medicine | Admitting: Internal Medicine

## 2020-04-23 DIAGNOSIS — I081 Rheumatic disorders of both mitral and tricuspid valves: Secondary | ICD-10-CM | POA: Insufficient documentation

## 2020-04-23 DIAGNOSIS — Z9221 Personal history of antineoplastic chemotherapy: Secondary | ICD-10-CM | POA: Diagnosis not present

## 2020-04-23 DIAGNOSIS — I5031 Acute diastolic (congestive) heart failure: Secondary | ICD-10-CM | POA: Insufficient documentation

## 2020-04-23 DIAGNOSIS — I110 Hypertensive heart disease with heart failure: Secondary | ICD-10-CM | POA: Diagnosis not present

## 2020-04-23 DIAGNOSIS — J90 Pleural effusion, not elsewhere classified: Secondary | ICD-10-CM | POA: Diagnosis not present

## 2020-04-23 DIAGNOSIS — I509 Heart failure, unspecified: Secondary | ICD-10-CM

## 2020-04-23 LAB — ECHOCARDIOGRAM COMPLETE
AR max vel: 1.79 cm2
AV Area VTI: 1.74 cm2
AV Area mean vel: 1.73 cm2
AV Mean grad: 4.5 mmHg
AV Peak grad: 7.8 mmHg
Ao pk vel: 1.4 m/s
Area-P 1/2: 3.85 cm2
S' Lateral: 2.21 cm

## 2020-04-23 NOTE — Progress Notes (Signed)
*  PRELIMINARY RESULTS* Echocardiogram 2D Echocardiogram has been performed.  Sherrie Sport 04/23/2020, 9:40 AM

## 2020-04-27 ENCOUNTER — Other Ambulatory Visit
Admission: RE | Admit: 2020-04-27 | Discharge: 2020-04-27 | Disposition: A | Discharge status: Home / Self Care | Payer: Medicare HMO | Source: Ambulatory Visit | Attending: Physician Assistant | Admitting: Physician Assistant

## 2020-04-27 DIAGNOSIS — I5031 Acute diastolic (congestive) heart failure: Secondary | ICD-10-CM | POA: Diagnosis not present

## 2020-04-27 DIAGNOSIS — I10 Essential (primary) hypertension: Secondary | ICD-10-CM | POA: Diagnosis not present

## 2020-04-27 DIAGNOSIS — G2581 Restless legs syndrome: Secondary | ICD-10-CM | POA: Diagnosis not present

## 2020-04-27 DIAGNOSIS — Z79899 Other long term (current) drug therapy: Secondary | ICD-10-CM | POA: Diagnosis not present

## 2020-04-27 DIAGNOSIS — R609 Edema, unspecified: Secondary | ICD-10-CM | POA: Diagnosis not present

## 2020-04-27 LAB — BRAIN NATRIURETIC PEPTIDE: B Natriuretic Peptide: 217.1 pg/mL — ABNORMAL HIGH (ref 0.0–100.0)

## 2020-05-06 ENCOUNTER — Other Ambulatory Visit: Payer: Self-pay | Admitting: Cardiology

## 2020-05-06 ENCOUNTER — Other Ambulatory Visit (HOSPITAL_COMMUNITY): Payer: Self-pay | Admitting: Cardiology

## 2020-05-06 DIAGNOSIS — I5031 Acute diastolic (congestive) heart failure: Secondary | ICD-10-CM

## 2020-05-12 ENCOUNTER — Encounter: Payer: Self-pay | Admitting: Internal Medicine

## 2020-05-12 ENCOUNTER — Ambulatory Visit (INDEPENDENT_AMBULATORY_CARE_PROVIDER_SITE_OTHER): Payer: Medicare HMO | Admitting: Internal Medicine

## 2020-05-12 ENCOUNTER — Other Ambulatory Visit: Payer: Self-pay

## 2020-05-12 VITALS — BP 128/78 | HR 80 | Temp 97.4°F | Resp 18 | Ht 60.0 in | Wt 164.0 lb

## 2020-05-12 DIAGNOSIS — I739 Peripheral vascular disease, unspecified: Secondary | ICD-10-CM | POA: Diagnosis not present

## 2020-05-12 DIAGNOSIS — M7989 Other specified soft tissue disorders: Secondary | ICD-10-CM | POA: Diagnosis not present

## 2020-05-12 DIAGNOSIS — I10 Essential (primary) hypertension: Secondary | ICD-10-CM | POA: Diagnosis not present

## 2020-05-12 DIAGNOSIS — R600 Localized edema: Secondary | ICD-10-CM | POA: Diagnosis not present

## 2020-05-12 DIAGNOSIS — I509 Heart failure, unspecified: Secondary | ICD-10-CM | POA: Diagnosis not present

## 2020-05-12 DIAGNOSIS — E7800 Pure hypercholesterolemia, unspecified: Secondary | ICD-10-CM | POA: Diagnosis not present

## 2020-05-12 MED ORDER — FUROSEMIDE 20 MG PO TABS: 20.0000 mg | ORAL_TABLET | Freq: Every day | ORAL | 1 refills | Status: DC

## 2020-05-12 NOTE — Progress Notes (Signed)
Patient ID: [name], female   DOB: [birth year], 87 y.o.   MRN: [id number]   Subjective:    Patient ID: [name], female    DOB: Jan 01, 1929, 87 y.o.   MRN: [id number]  HPI This visit occurred during the SARS-CoV-2 public health emergency.  Safety protocols were in place, including screening questions prior to the visit, additional usage of staff PPE, and extensive cleaning of exam room while observing appropriate contact time as indicated for disinfecting solutions.  Patient here for scheduled follow up.  She is accompanied by her daughter.  History obtained from both of them.  Was recently evaluated for increased sob.  CXR revealed changes c/w CHF.  Started on lasix 40mg  q day.  Saw cardiology.  ECHO 04/23/20 - EF 60-65% with no regional wall motion abnormalities with moderated TR.  Lexiscan myoview recommended.  Pt declined.  Was instructed to continue lasix 20mg  q day.  She is weighing herself.  Weight here today is elevated from our previous check, but she reports overall weight is down 10 pounds.  She feels better.  Breathing is better.  No chest pain. No increased wheezing or cough.  No acid reflux reported.  No problems swallowing.  Eating.  No nausea or vomiting.  No abdominal pain.  Using her walker to ambulate.   Past Medical History:  Diagnosis Date  . Anemia   . Barrett's esophagus   . BCC (basal cell carcinoma of skin) 03/07/2013   vertex scalp - NODULAR NODULAR PATTERN PATTERN  . Bowel obstruction (HCC)    s/p adhesion resection  . Chronic back pain   . Colon cancer ([facility]) 1988 and 1989   adenocarcinoma, s/p resection x  2 and chemo  . Diverticulitis   . GERD (gastroesophageal reflux disease)   . H/O open leg wound   . Hiatal hernia   . Hypercholesteremia   . Hypertension   . Lumbar scoliosis   . OA (osteoarthritis)   . Peripheral neuropathy   . Recurrent sinus infections   . Renal cyst   . S/P chemotherapy, time since greater than 12 weeks    colon cancer  .  Vertigo    Past Surgical History:  Procedure Laterality Date  . ABDOMINAL HYSTERECTOMY  1982  . adhesions resected     bowel obstruction  . APPENDECTOMY    . BREAST BIOPSY Left    negative 06/14/1985  . CHOLECYSTECTOMY  1994  . COLON RESECTION     x2.  s/p colon cancer  . [place]  . EXCISIONAL HEMORRHOIDECTOMY     with tubal ligation  . EXPLORATORY LAPAROTOMY  1991   Secondary to SBO   Family History  Problem Relation Age of Onset  . Breast cancer Mother   . Asthma Mother   . Stroke Father   . Hypertension Father   . Diabetes Father   . Ovarian cancer Sister        x2  . Prostate cancer Brother   . Hypertension Brother        x3  . Heart disease Brother   . Hypercholesterolemia Brother        x3  . Diabetes Brother   . Spina bifida Grandchild   . Hematuria Neg Hx   . Kidney cancer Neg Hx   . Kidney disease Neg Hx   . Sickle cell trait Neg Hx   . Tuberculosis Neg Hx    Social History  Socioeconomic History  . Marital status: Married    Spouse name: Not on file  . Number of children: 4  . Years of education: 12th grade  . Highest education level: Not on file  Occupational History  . Occupation: homemaker  Tobacco Use  . Smoking status: Never Smoker  . Smokeless tobacco: Never Used  Vaping Use  . Vaping Use: Never used  Substance and Sexual Activity  . Alcohol use: No    Alcohol/week: 0.0 standard drinks  . Drug use: No  . Sexual activity: Never  Other Topics Concern  . Not on file  Social History Narrative  . Not on file   Social Determinants of Health   Financial Resource Strain: Low Risk   . Difficulty of Paying Living Expenses: Not hard at all  Food Insecurity: No Food Insecurity  . Worried About Charity fundraiser in the Last Year: Never true  . Ran Out of Food in the Last Year: Never true  Transportation Needs: No Transportation Needs  . Lack of Transportation (Medical): No  . Lack of  Transportation (Non-Medical): No  Physical Activity: Not on file  Stress: No Stress Concern Present  . Feeling of Stress : Not at all  Social Connections: Unknown  . Frequency of Communication with Friends and Family: Not on file  . Frequency of Social Gatherings with Friends and Family: Not on file  . Attends Religious Services: Not on file  . Active Member of Clubs or Organizations: Not on file  . Attends Archivist Meetings: Not on file  . Marital Status: Married    Outpatient Encounter Medications as of 05/12/2020  Medication Sig  . albuterol (VENTOLIN HFA) 108 (90 Base) MCG/ACT inhaler TAKE 2 PUFFS BY MOUTH EVERY 6 HOURS AS NEEDED FOR WHEEZE OR SHORTNESS OF BREATH  . amLODipine (NORVASC) 5 MG tablet Take 1 tablet (5 mg total) by mouth daily.  . calcium carbonate (OSCAL) 1500 (600 Ca) MG TABS tablet Take 600 mg of elemental calcium by mouth 2 (two) times daily with a meal.  . Cholecalciferol (VITAMIN D3) 50 MCG (2000 UT) capsule Take 2,000 Units by mouth daily.  Marland Kitchen docusate sodium (COLACE) 100 MG capsule Take 100 mg by mouth 2 (two) times daily.  . fexofenadine (ALLEGRA) 60 MG tablet TAKE 1 TABLET EVERY DAY  . furosemide (LASIX) 20 MG tablet Take 1 tablet (20 mg total) by mouth daily.  Marland Kitchen gabapentin (NEURONTIN) 100 MG capsule TAKE 2 CAPSULES AT BEDTIME  . irbesartan (AVAPRO) 300 MG tablet TAKE 1 TABLET EVERY DAY  . lisinopril-hydrochlorothiazide (ZESTORETIC) 10-12.5 MG tablet Take 1 tablet by mouth daily.  Marland Kitchen losartan (COZAAR) 50 MG tablet Take 50 mg by mouth daily.  . meclizine (ANTIVERT) 12.5 MG tablet Take 1 tablet (12.5 mg total) by mouth 2 (two) times daily as needed for dizziness.  . meloxicam (MOBIC) 7.5 MG tablet   . metoCLOPramide (REGLAN) 5 MG tablet Take 5 mg by mouth 2 (two) times daily.  . metoprolol succinate (TOPROL-XL) 25 MG 24 hr tablet Take 1 tablet (25 mg total) by mouth 2 (two) times daily.  Marland Kitchen nystatin cream (MYCOSTATIN) Apply 1 application topically 2  (two) times daily. (Patient not taking: Reported on 04/16/2020)  . ondansetron (ZOFRAN ODT) 4 MG disintegrating tablet Take 1 tablet (4 mg total) by mouth 2 (two) times daily as needed for nausea or vomiting.  Marland Kitchen oxybutynin (DITROPAN-XL) 10 MG 24 hr tablet Take 1 tablet (10 mg total) by mouth daily.  Marland Kitchen  oxyCODONE (OXY IR/ROXICODONE) 5 MG immediate release tablet Take 5 mg by mouth 3 (three) times daily.   . pantoprazole (PROTONIX) 40 MG tablet TAKE 1 TABLET TWICE DAILY  . simvastatin (ZOCOR) 10 MG tablet TAKE 1 TABLET (10 MG TOTAL) BY MOUTH AT BEDTIME.  Marland Kitchen Spacer/Aero-Holding Chambers [facility] DIAMOND) MISC   . traMADol-acetaminophen (ULTRACET) 37.5-325 MG per tablet Take 1 tablet by mouth 2 (two) times daily as needed.   . [DISCONTINUED] furosemide (LASIX) 40 MG tablet Take 1 tablet (40 mg total) by mouth daily.   No facility-administered encounter medications on file as of 05/12/2020.    Review of Systems  Constitutional: Negative for appetite change and fever.       Per her report, weight is down 10 pounds.   HENT: Negative for congestion and sinus pressure.   Respiratory: Negative for cough and chest tightness.        Breathing stable.   Cardiovascular: Negative for chest pain and palpitations.       Leg swelling improved.   Gastrointestinal: Negative for abdominal pain, nausea and vomiting.  Genitourinary: Negative for difficulty urinating and dysuria.  Musculoskeletal: Negative for joint swelling and myalgias.  Skin: Negative for color change and rash.  Neurological: Negative for dizziness, light-headedness and headaches.  Psychiatric/Behavioral: Negative for agitation.       Objective:    Physical Exam Vitals reviewed.  Constitutional:      General: She is not in acute distress.    Appearance: Normal appearance.  HENT:     Head: Normocephalic and atraumatic.     Right Ear: External ear normal.     Left Ear: External ear normal.  Eyes:     General: No scleral icterus.        Right eye: No discharge.        Left eye: No discharge.     Conjunctiva/sclera: Conjunctivae normal.  Neck:     Thyroid: No thyromegaly.  Cardiovascular:     Rate and Rhythm: Normal rate and regular rhythm.  Pulmonary:     Effort: No respiratory distress.     Breath sounds: Normal breath sounds. No wheezing.  Abdominal:     General: Bowel sounds are normal.     Palpations: Abdomen is soft.     Tenderness: There is no abdominal tenderness.  Musculoskeletal:        General: No tenderness.     Cervical back: Neck supple. No tenderness.     Comments: Compression hose in place.  Persistent lower extremity swelling - improved.    Lymphadenopathy:     Cervical: No cervical adenopathy.  Skin:    Findings: No erythema or rash.  Neurological:     Mental Status: She is alert.  Psychiatric:        Mood and Affect: Mood normal.        Behavior: Behavior normal.     BP 128/78   Pulse 80   Temp (!) 97.4 F (36.3 C) (Oral)   Resp 18   Ht 5' (1.524 m)   Wt 164 lb (74.4 kg)   SpO2 95%   BMI 32.03 kg/m  Wt Readings from Last 3 Encounters:  05/12/20 164 lb (74.4 kg)  04/16/20 158 lb (71.7 kg)  02/20/20 158 lb (71.7 kg)     Lab Results  Component Value Date   WBC 7.1 12/24/2019   HGB 13.8 12/24/2019   HCT 41.2 12/24/2019   PLT 275.0 12/24/2019   GLUCOSE 131 (H) 05/12/2020  CHOL 149 12/24/2019   TRIG 126.0 12/24/2019   HDL 67.80 12/24/2019   LDLCALC 56 12/24/2019   ALT 11 05/12/2020   AST 22 05/12/2020   NA 139 05/12/2020   K 4.6 05/12/2020   CL 102 05/12/2020   CREATININE 1.17 05/12/2020   BUN 26 (H) 05/12/2020   CO2 28 05/12/2020   TSH 2.86 12/24/2019   INR 0.91 06/11/2015   HGBA1C 5.6 12/24/2019       Assessment & Plan:   Problem List Items Addressed This Visit    Hereditary hemochromatosis ([place])    Previously evaluated by hematology.  Declines further evaluation/work up.  Follow cbc and iron studies.       Hypercholesteremia    Continue simvastatin.   Follow lipid panel and liver function tests.  Continue low cholesterol diet and exercise as tolerated.       Relevant Medications   furosemide (LASIX) 20 MG tablet   Hypertension - Primary    Blood pressure on recheck improved.  Continue avapro, amlodipine, metoprolol and lasix.  Continue to spot check blood pressure. Check metabolic panel today.       Relevant Medications   furosemide (LASIX) 20 MG tablet   Lower extremity edema    Swelling improved.  Continue lasix and compression hose.  Follow.  Check metabolic panel today.       Relevant Orders   Hepatic function panel (Completed)   Basic metabolic panel (Completed)   PAD (peripheral artery disease) ([place])    Previously evaluated by [facility].  Continue risk factor modification.  Continue simvastatin.       Relevant Medications   furosemide (LASIX) 20 MG tablet   Swelling of left lower extremity    Lower extremity swelling improved.  Continue compression hose.  Continue lasix - 20mg  q day.  Check metabolic panel today.         Other Visit Diagnoses    Congestive heart failure, unspecified HF chronicity, unspecified heart failure type (HCC)       Relevant Medications   furosemide (LASIX) 20 MG tablet   Leg edema           [name], MD

## 2020-05-13 LAB — BASIC METABOLIC PANEL
BUN: 26 mg/dL — ABNORMAL HIGH (ref 6–23)
CO2: 28 mEq/L (ref 19–32)
Calcium: 10.3 mg/dL (ref 8.4–10.5)
Chloride: 102 mEq/L (ref 96–112)
Creatinine, Ser: 1.17 mg/dL (ref 0.40–1.20)
GFR: 41.44 mL/min — ABNORMAL LOW (ref >60.00)
Glucose, Bld: 131 mg/dL — ABNORMAL HIGH (ref 70–99)
Potassium: 4.6 mEq/L (ref 3.5–5.1)
Sodium: 139 mEq/L (ref 135–145)

## 2020-05-13 LAB — HEPATIC FUNCTION PANEL
ALT: 11 U/L (ref 0–35)
AST: 22 U/L (ref 0–37)
Albumin: 4.7 g/dL (ref 3.5–5.2)
Alkaline Phosphatase: 68 U/L (ref 39–117)
Bilirubin, Direct: 0.3 mg/dL (ref 0.0–0.3)
Total Bilirubin: 1.8 mg/dL — ABNORMAL HIGH (ref 0.2–1.2)
Total Protein: 7.4 g/dL (ref 6.0–8.3)

## 2020-05-14 ENCOUNTER — Encounter: Payer: Self-pay | Admitting: Internal Medicine

## 2020-05-14 ENCOUNTER — Telehealth: Payer: Self-pay | Admitting: Internal Medicine

## 2020-05-14 NOTE — Assessment & Plan Note (Signed)
Blood pressure on recheck improved.  Continue avapro, amlodipine, metoprolol and lasix.  Continue to spot check blood pressure. Check metabolic panel today.

## 2020-05-14 NOTE — Assessment & Plan Note (Signed)
Lower extremity swelling improved.  Continue compression hose.  Continue lasix - 20mg  q day.  Check metabolic panel today.

## 2020-05-14 NOTE — Telephone Encounter (Signed)
Patient has been informed.

## 2020-05-14 NOTE — Assessment & Plan Note (Signed)
Previously evaluated by hematology.  Declines further evaluation/work up.  Follow cbc and iron studies.

## 2020-05-14 NOTE — Assessment & Plan Note (Signed)
Swelling improved.  Continue lasix and compression hose.  Follow.  Check metabolic panel today.

## 2020-05-14 NOTE — Assessment & Plan Note (Signed)
Continue simvastatin.  Follow lipid panel and liver function tests.  Continue low cholesterol diet and exercise as tolerated.  

## 2020-05-14 NOTE — Telephone Encounter (Signed)
In reviewing chart, it appears we do not refill this medication.  I think she sees Dr Sharlet Salina.

## 2020-05-14 NOTE — Assessment & Plan Note (Signed)
Previously evaluated by [facility].  Continue risk factor modification.  Continue simvastatin.  

## 2020-05-14 NOTE — Telephone Encounter (Signed)
Patient called in need refill on traMADol-acetaminophen (ULTRACET) 37.5-325 MG per tablet  She wants it sent [facility]

## 2020-05-18 ENCOUNTER — Other Ambulatory Visit: Payer: Self-pay | Admitting: Internal Medicine

## 2020-05-20 ENCOUNTER — Other Ambulatory Visit: Payer: Self-pay

## 2020-05-20 ENCOUNTER — Telehealth: Payer: Self-pay | Admitting: Internal Medicine

## 2020-05-20 DIAGNOSIS — R944 Abnormal results of kidney function studies: Secondary | ICD-10-CM

## 2020-05-20 NOTE — Telephone Encounter (Signed)
See result note.  

## 2020-05-20 NOTE — Telephone Encounter (Signed)
Pt husband called to get results

## 2020-05-21 ENCOUNTER — Other Ambulatory Visit (INDEPENDENT_AMBULATORY_CARE_PROVIDER_SITE_OTHER): Payer: Medicare HMO

## 2020-05-21 ENCOUNTER — Other Ambulatory Visit: Payer: Self-pay

## 2020-05-21 DIAGNOSIS — R944 Abnormal results of kidney function studies: Secondary | ICD-10-CM

## 2020-05-22 LAB — BASIC METABOLIC PANEL
BUN: 20 mg/dL (ref 6–23)
CO2: 27 mEq/L (ref 19–32)
Calcium: 10 mg/dL (ref 8.4–10.5)
Chloride: 101 mEq/L (ref 96–112)
Creatinine, Ser: 1.07 mg/dL (ref 0.40–1.20)
GFR: 46.12 mL/min — ABNORMAL LOW (ref >60.00)
Glucose, Bld: 107 mg/dL — ABNORMAL HIGH (ref 70–99)
Potassium: 4.4 mEq/L (ref 3.5–5.1)
Sodium: 137 mEq/L (ref 135–145)

## 2020-06-02 ENCOUNTER — Encounter: Payer: Self-pay | Admitting: Pulmonary Disease

## 2020-06-02 NOTE — Progress Notes (Signed)
Subjective:    Patient ID: [name], female    DOB: 1928-09-14, 87 y.o.   MRN: [id number]  HPI Patient is an 87 year old lifelong never smoker who presents for evaluation of "wheezing" which started approximately 6 weeks prior.  She is kindly referred by Dr. [name].  Patient notes that there is no specific time that this "wheezing" can come on.  She does not have dyspnea associated with this.  She does have significant reflux symptoms that have been plaguing her for years.  She has a documented sizable hiatal hernia.  She has used albuterol inhaler that she feels may help her but she is not certain.  She notes that when she is tired her symptoms get worse.  She has not had any fevers, chills or sweats.  No cough or sputum production.  As noted she does have daily reflux symptoms and occasional regurgitation.  She does not endorse any other symptomatology.  No chest pain, orthopnea, paroxysmal nocturnal dyspnea or lower extremity edema.  She does not have significant occupational history.  As noted lifelong never smoker, no pets or unusual hobbies.  No travel outside of the country.  Has always resided in [place].  Review of Systems A 10 point review of systems was performed and it is as noted above otherwise negative.  Past Medical History:  Diagnosis Date  . Anemia   . Barrett's esophagus   . BCC (basal cell carcinoma of skin) 03/07/2013   vertex scalp - NODULAR NODULAR PATTERN PATTERN  . Bowel obstruction (HCC)    s/p adhesion resection  . Chronic back pain   . Colon cancer ([place]) 1988 and 1989   adenocarcinoma, s/p resection x  2 and chemo  . Diverticulitis   . GERD (gastroesophageal reflux disease)   . H/O open leg wound   . Hiatal hernia   . Hypercholesteremia   . Hypertension   . Lumbar scoliosis   . OA (osteoarthritis)   . Peripheral neuropathy   . Recurrent sinus infections   . Renal cyst   . S/P chemotherapy, time since greater than 12 weeks     colon cancer  . Vertigo    Past Surgical History:  Procedure Laterality Date  . ABDOMINAL HYSTERECTOMY  1982  . adhesions resected     bowel obstruction  . APPENDECTOMY    . BREAST BIOPSY Left    negative 06/14/1985  . CHOLECYSTECTOMY  1994  . COLON RESECTION     x2.  s/p colon cancer  . [place]  . EXCISIONAL HEMORRHOIDECTOMY     with tubal ligation  . EXPLORATORY LAPAROTOMY  1991   Secondary to SBO   Family History  Problem Relation Age of Onset  . Breast cancer Mother   . Asthma Mother   . Stroke Father   . Hypertension Father   . Diabetes Father   . Ovarian cancer Sister        x2  . Prostate cancer Brother   . Hypertension Brother        x3  . Heart disease Brother   . Hypercholesterolemia Brother        x3  . Diabetes Brother   . Spina bifida Grandchild   . Hematuria Neg Hx   . Kidney cancer Neg Hx   . Kidney disease Neg Hx   . Sickle cell trait Neg Hx   . Tuberculosis Neg Hx    Social  History   Tobacco Use  . Smoking status: Never Smoker  . Smokeless tobacco: Never Used  Substance Use Topics  . Alcohol use: No    Alcohol/week: 0.0 standard drinks   Allergies  Allergen Reactions  . Fentanyl Other (See Comments)    confusion   Current Meds  Medication Sig  . albuterol (VENTOLIN HFA) 108 (90 Base) MCG/ACT inhaler TAKE 2 PUFFS BY MOUTH EVERY 6 HOURS AS NEEDED FOR WHEEZE OR SHORTNESS OF BREATH  . meloxicam (MOBIC) 7.5 MG tablet   . metoCLOPramide (REGLAN) 5 MG tablet Take 5 mg by mouth 2 (two) times daily.  Marland Kitchen oxyCODONE (OXY IR/ROXICODONE) 5 MG immediate release tablet Take 5 mg by mouth 3 (three) times daily.   . traMADol-acetaminophen (ULTRACET) 37.5-325 MG per tablet Take 1 tablet by mouth 2 (two) times daily as needed.   . [DISCONTINUED] amLODipine (NORVASC) 10 MG tablet Take 5 mg by mouth daily.   . [DISCONTINUED] fexofenadine (ALLEGRA) 60 MG tablet TAKE 1 TABLET EVERY DAY  . [DISCONTINUED] furosemide  (LASIX) 20 MG tablet Take 1 tablet (20 mg total) by mouth daily.  . [DISCONTINUED] gabapentin (NEURONTIN) 100 MG capsule Take two capsules q hs  . [DISCONTINUED] irbesartan (AVAPRO) 300 MG tablet Take 1 tablet (300 mg total) by mouth daily.  . [DISCONTINUED] metoprolol succinate (TOPROL-XL) 25 MG 24 hr tablet TAKE 1 TABLET EVERY DAY  . [DISCONTINUED] ondansetron (ZOFRAN) 4 MG tablet Take 1 tablet (4 mg total) by mouth 2 (two) times daily as needed for nausea or vomiting.  . [DISCONTINUED] pantoprazole (PROTONIX) 40 MG tablet Take 40 mg by mouth 2 (two) times daily.  . [DISCONTINUED] simvastatin (ZOCOR) 10 MG tablet Take 1 tablet (10 mg total) by mouth at bedtime.  . [DISCONTINUED] solifenacin (VESICARE) 5 MG tablet Take 1 tablet (5 mg total) by mouth daily.   Immunization History  Administered Date(s) Administered  . Fluad Quad(high Dose 65+) 12/03/2018  . Influenza Split 12/08/2011  . Influenza, High Dose Seasonal PF 11/17/2015, 10/18/2016, 11/27/2017  . Influenza,inj,Quad PF,6+ Mos 10/31/2013, 10/23/2014  . Influenza-Unspecified 11/17/2015  . PFIZER(Purple Top)SARS-COV-2 Vaccination 03/04/2019, 03/25/2019  . Pneumococcal Conjugate-13 05/07/2013  . Pneumococcal-Unspecified 11/30/2000, 01/04/2006  . Td 11/20/2007  . Tdap 04/15/2018  . Zoster Recombinat (Shingrix) 09/19/2017, 12/28/2017      Objective:   Physical Exam BP 130/78 (BP Location: Right Arm, Cuff Size: Normal)   Pulse 60   Ht 5\' 2"  (1.575 m)   Wt 153 lb 9.6 oz (69.7 kg)   SpO2 95%   BMI 28.09 kg/m  GENERAL: Well-developed somewhat overweight woman, no acute distress.  Fully ambulatory.  No conversational dyspnea.  No hoarseness. HEAD: Normocephalic, atraumatic.  EYES: Pupils equal, round, reactive to light.  No scleral icterus.  MOUTH: Nose/mouth/throat not examined due to masking requirements for COVID 19. NECK: Supple. No thyromegaly. Trachea midline. No JVD.  No adenopathy. PULMONARY: Good air entry bilaterally.  No  adventitious sounds. CARDIOVASCULAR: S1 and S2. Regular rate and rhythm.  Normal ABDOMEN: Benign. MUSCULOSKELETAL: No joint deformity, no clubbing, no edema (with compression stockings).  NEUROLOGIC: No focal deficit, no gait disturbance, speech is fluent. SKIN: Intact,warm,dry. PSYCH: Mood and behavior normal.   Chest x-ray performed 24 December 2018 shows a sizable hiatal hernia:        Assessment & Plan:     ICD-10-CM   1. Wheezing  R06.2 Pulmonary Function Test [facility] Only   I suspect that this is mostly "pseudo wheeze" Likely triggered by GERD/LPR Will obtain  PFTs to clarify  2. Hiatal hernia with GERD  K21.9    K44.9    She is on PPI She has been rereferred to her gastroenterologist I suspect she may need intervention on her hiatal hernia   Orders Placed This Encounter  Procedures  . Pulmonary Function Test [facility] Only    Standing Status:   Future    Standing Expiration Date:   06/10/2020    Scheduling Instructions:     urgent    Order Specific Question:   Full PFT: includes the following: basic spirometry, spirometry pre & post bronchodilator, diffusion capacity (DLCO), lung volumes    Answer:   Full PFT    Order Specific Question:   MIP/MEP    Answer:   No    Order Specific Question:   6 minute walk    Answer:   No    Order Specific Question:   ABG    Answer:   No    Order Specific Question:   Diffusion capacity (DLCO)    Answer:   Yes    Order Specific Question:   Lung volumes    Answer:   Yes    Order Specific Question:   Methacholine challenge    Answer:   No    Order Specific Question:   This test can only be performed at    Answer:   [facility]   I suspect this patient's issues are secondary to vocal cord irritation and "pseudowheeze" that occurs when she refluxes.  She has a sizable hiatal hernia and I suspect that this is leading to significant GERD/LPR.  I recommend that she revisit with her gastroenterologist office at [facility].  I suspect  she may need EGD.  Will obtain PFTs for completeness.  Renold Don, MD [place] [facility]   *This note was dictated using voice recognition software/Dragon.  Despite best efforts to proofread, errors can occur which can change the meaning.  Any change was purely unintentional.

## 2020-06-22 DIAGNOSIS — M5416 Radiculopathy, lumbar region: Secondary | ICD-10-CM | POA: Diagnosis not present

## 2020-06-22 DIAGNOSIS — M5136 Other intervertebral disc degeneration, lumbar region: Secondary | ICD-10-CM | POA: Diagnosis not present

## 2020-06-22 DIAGNOSIS — M62830 Muscle spasm of back: Secondary | ICD-10-CM | POA: Diagnosis not present

## 2020-06-22 DIAGNOSIS — M48062 Spinal stenosis, lumbar region with neurogenic claudication: Secondary | ICD-10-CM | POA: Diagnosis not present

## 2020-06-24 ENCOUNTER — Encounter: Payer: Self-pay | Admitting: Dermatology

## 2020-06-24 ENCOUNTER — Ambulatory Visit (INDEPENDENT_AMBULATORY_CARE_PROVIDER_SITE_OTHER): Payer: Medicare HMO | Admitting: Dermatology

## 2020-06-24 ENCOUNTER — Other Ambulatory Visit: Payer: Self-pay

## 2020-06-24 DIAGNOSIS — D1800 Hemangioma unspecified site: Secondary | ICD-10-CM

## 2020-06-24 DIAGNOSIS — I872 Venous insufficiency (chronic) (peripheral): Secondary | ICD-10-CM

## 2020-06-24 DIAGNOSIS — L719 Rosacea, unspecified: Secondary | ICD-10-CM

## 2020-06-24 DIAGNOSIS — D692 Other nonthrombocytopenic purpura: Secondary | ICD-10-CM | POA: Diagnosis not present

## 2020-06-24 DIAGNOSIS — L821 Other seborrheic keratosis: Secondary | ICD-10-CM | POA: Diagnosis not present

## 2020-06-24 DIAGNOSIS — L578 Other skin changes due to chronic exposure to nonionizing radiation: Secondary | ICD-10-CM

## 2020-06-24 DIAGNOSIS — Z1283 Encounter for screening for malignant neoplasm of skin: Secondary | ICD-10-CM | POA: Diagnosis not present

## 2020-06-24 DIAGNOSIS — D229 Melanocytic nevi, unspecified: Secondary | ICD-10-CM

## 2020-06-24 DIAGNOSIS — L814 Other melanin hyperpigmentation: Secondary | ICD-10-CM | POA: Diagnosis not present

## 2020-06-24 DIAGNOSIS — Z85828 Personal history of other malignant neoplasm of skin: Secondary | ICD-10-CM | POA: Diagnosis not present

## 2020-06-24 DIAGNOSIS — L820 Inflamed seborrheic keratosis: Secondary | ICD-10-CM

## 2020-06-24 NOTE — Progress Notes (Signed)
Follow-Up Visit   Subjective  [name] is a 87 y.o. female who presents for the following: Annual Exam (Mole check ). Hx of BCC  The patient presents for Total-Body Skin Exam (TBSE) for skin cancer screening and mole check.  The following portions of the chart were reviewed this encounter and updated as appropriate:   Tobacco  Allergies  Meds  Problems  Med Hx  Surg Hx  Fam Hx     Review of Systems:  No other skin or systemic complaints except as noted in HPI or Assessment and Plan.  Objective  Well appearing patient in no apparent distress; mood and affect are within normal limits.  A full examination was performed including scalp, head, eyes, ears, nose, lips, neck, chest, axillae, abdomen, back, buttocks, bilateral upper extremities, bilateral lower extremities, hands, feet, fingers, toes, fingernails, and toenails. All findings within normal limits unless otherwise noted below.  Objective  Head - Anterior (Face): Mid face erythema with telangiectasias +/- scattered inflammatory papules.   Objective  Left Upper Back, Left leg (3): Erythematous keratotic or waxy stuck-on papule or plaque.   Objective  Left Lower Leg - Anterior: Erythematous, scaly patches involving the ankle and distal lower leg with associated lower leg edema.   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 It is treatable but not curable. It sometimes affects the eyes (ocular rosacea) as well. It may respond to topical and/or systemic medication and can flare with stress, sun exposure, alcohol, exercise and some foods.  Daily application of broad spectrum spf 30+ sunscreen to face is recommended to reduce flares.   Discussed laser a option  Inflamed seborrheic keratosis (3) Left Upper Back, Left leg Destruction of lesion - Left Upper Back,  Left leg Complexity: simple   Destruction method: cryotherapy   Informed consent: discussed and consent obtained   Timeout:  patient name, date of birth, surgical site, and procedure verified Lesion destroyed using liquid nitrogen: Yes   Region frozen until ice ball extended beyond lesion: Yes   Outcome: patient tolerated procedure well with no complications   Post-procedure details: wound care instructions given    Stasis dermatitis of both legs Left Lower Leg - Anterior Stasis in the legs causes chronic leg swelling, which may result in itchy or painful rashes, skin discoloration, skin texture changes, and sometimes ulceration.  Recommend daily compression hose/stockings- easiest to put on first thing in morning, remove at bedtime.  Elevate legs as much as possible. Avoid salt/sodium rich foods.  Cont wearing compression stocking daily    Lentigines - Scattered tan macules - Due to sun exposure - Benign-appering, observe - Recommend daily broad spectrum sunscreen SPF 30+ to sun-exposed areas, reapply every 2 hours as needed. - Call for any changes  Seborrheic Keratoses - Stuck-on, waxy, tan-brown papules and/or plaques  - Benign-appearing - Discussed benign etiology and prognosis. - Observe - Call for any changes  Melanocytic Nevi - Tan-brown and/or pink-flesh-colored symmetric macules and papules - Benign appearing on exam today - Observation - Call clinic for new or changing moles - Recommend daily use of broad spectrum spf 30+ sunscreen to sun-exposed areas.   Hemangiomas - Red papules - Discussed benign nature - Observe - Call for any changes  Actinic Damage - Chronic condition, secondary to cumulative UV/sun exposure - diffuse scaly erythematous macules with underlying dyspigmentation - Recommend  daily broad spectrum sunscreen SPF 30+ to sun-exposed areas, reapply every 2 hours as needed.  - Staying in the shade or wearing long sleeves, sun glasses (UVA+UVB  protection) and wide brim hats (4-inch brim around the entire circumference of the hat) are also recommended for sun protection.  - Call for new or changing lesions.  History of Basal Cell Carcinoma of the Skin Scalp  - No evidence of recurrence today - Recommend regular full body skin exams - Recommend daily broad spectrum sunscreen SPF 30+ to sun-exposed areas, reapply every 2 hours as needed.  - Call if any new or changing lesions are noted between office visits  Purpura - Chronic; persistent and recurrent.  Treatable, but not curable. - Violaceous macules and patches - Benign - Related to trauma, age, sun damage and/or use of blood thinners, chronic use of topical and/or oral steroids - Observe - Can use OTC arnica containing moisturizer such as Dermend Bruise Formula if desired - Call for worsening or other concerns  Skin cancer screening performed today.  Return in about 1 year (around 06/24/2021) for TBSE .  IMarye Round, CMA, am acting as scribe for Sarina Ser, MD .  Documentation: I have reviewed the above documentation for accuracy and completeness, and I agree with the above.  Sarina Ser, MD

## 2020-06-24 NOTE — Patient Instructions (Addendum)

## 2020-06-30 ENCOUNTER — Encounter: Payer: Self-pay | Admitting: Dermatology

## 2020-07-07 DIAGNOSIS — I10 Essential (primary) hypertension: Secondary | ICD-10-CM | POA: Diagnosis not present

## 2020-07-07 DIAGNOSIS — I5032 Chronic diastolic (congestive) heart failure: Secondary | ICD-10-CM | POA: Diagnosis not present

## 2020-07-07 DIAGNOSIS — R609 Edema, unspecified: Secondary | ICD-10-CM | POA: Diagnosis not present

## 2020-07-12 ENCOUNTER — Other Ambulatory Visit: Payer: Self-pay | Admitting: Internal Medicine

## 2020-07-12 DIAGNOSIS — I509 Heart failure, unspecified: Secondary | ICD-10-CM

## 2020-07-12 DIAGNOSIS — R600 Localized edema: Secondary | ICD-10-CM

## 2020-07-27 ENCOUNTER — Telehealth (INDEPENDENT_AMBULATORY_CARE_PROVIDER_SITE_OTHER): Payer: Medicare HMO | Admitting: Internal Medicine

## 2020-07-27 DIAGNOSIS — M7989 Other specified soft tissue disorders: Secondary | ICD-10-CM | POA: Diagnosis not present

## 2020-07-27 DIAGNOSIS — G8929 Other chronic pain: Secondary | ICD-10-CM | POA: Diagnosis not present

## 2020-07-27 DIAGNOSIS — K22719 Barrett's esophagus with dysplasia, unspecified: Secondary | ICD-10-CM | POA: Diagnosis not present

## 2020-07-27 DIAGNOSIS — M545 Low back pain, unspecified: Secondary | ICD-10-CM

## 2020-07-27 DIAGNOSIS — E7800 Pure hypercholesterolemia, unspecified: Secondary | ICD-10-CM | POA: Diagnosis not present

## 2020-07-27 DIAGNOSIS — I739 Peripheral vascular disease, unspecified: Secondary | ICD-10-CM

## 2020-07-27 DIAGNOSIS — I10 Essential (primary) hypertension: Secondary | ICD-10-CM | POA: Diagnosis not present

## 2020-07-27 DIAGNOSIS — I700 Atherosclerosis of aorta: Secondary | ICD-10-CM | POA: Diagnosis not present

## 2020-07-27 DIAGNOSIS — D509 Iron deficiency anemia, unspecified: Secondary | ICD-10-CM | POA: Diagnosis not present

## 2020-07-27 NOTE — Progress Notes (Signed)
Patient ID: [name], female   DOB: 1928/09/23, 87 y.o.   MRN: [id number]   Virtual Visit via video Note  This visit type was conducted due to national recommendations for restrictions regarding the COVID-19 pandemic (e.g. social distancing).  This format is felt to be most appropriate for this patient at this time.  All issues noted in this document were discussed and addressed.  No physical exam was performed (except for noted visual exam findings with Video Visits).   I connected with [name] by a video enabled telemedicine application and verified that I am speaking with the correct person using two identifiers. Location patient: home Location provider: work  Persons participating in the virtual visit: patient, provider  The limitations, risks, security and privacy concerns of performing an evaluation and management service by video and the availability of in person appointments have been discussed. It has also been discussed with the patient that there may be a patient responsible charge related to this service. The patient expressed understanding and agreed to proceed.   Reason for visit: scheduled follow up.   HPI:  Follow up regarding her blood pressure and lower extremity swelling.  Recently evaluated and cxr revealed large hiatal hernia and increased vascular congestion with mild interstitial edema and small effusions c/w CHF.  She was started on lasix 40mg  q day.  ECHO - EF 60-65%, moderate LVH with grade 2 DD with moderately elevated PAP and mild MR and milt to moderate TR.  Unable to tolerate lexiscan myoview.  Saw cardiology in f/u 06/2020. Stabe.  She reports she is doing well.  Taking lasix 20mg  q day. Blood pressure improved.  Lower extremity swelling improved.  Weight stable - 154 pounds.  Had cough two weeks ago.  Tool tessalon perles - resolved.  No chest pain.  Breathing stable.  Eating.  No nausea or vomiting.  Overall feels she is doing relatively well.   ROS: See  pertinent positives and negatives per HPI.  Past Medical History:  Diagnosis Date   Anemia    Barrett's esophagus    BCC (basal cell carcinoma of skin) 03/07/2013   vertex scalp - NODULAR NODULAR PATTERN PATTERN   Bowel obstruction (HCC)    s/p adhesion resection   Chronic back pain    Colon cancer ([place]) 1988 and 1989   adenocarcinoma, s/p resection x  2 and chemo   Diverticulitis    GERD (gastroesophageal reflux disease)    H/O open leg wound    Hiatal hernia    Hypercholesteremia    Hypertension    Lumbar scoliosis    OA (osteoarthritis)    Peripheral neuropathy    Recurrent sinus infections    Renal cyst    S/P chemotherapy, time since greater than 12 weeks    colon cancer   Vertigo     Past Surgical History:  Procedure Laterality Date   ABDOMINAL HYSTERECTOMY  1982   adhesions resected     bowel obstruction   APPENDECTOMY     BREAST BIOPSY Left    negative 06/14/1985   CHOLECYSTECTOMY  1994   COLON RESECTION     x2.  s/p colon cancer   COLON SURGERY  1958 and 1989   For Colon Cancer   EXCISIONAL HEMORRHOIDECTOMY     with tubal ligation   EXPLORATORY LAPAROTOMY  1991   Secondary to SBO    Family History  Problem Relation Age of Onset   Breast cancer Mother    Asthma Mother  Stroke Father    Hypertension Father    Diabetes Father    Ovarian cancer Sister        x2   Prostate cancer Brother    Hypertension Brother        x3   Heart disease Brother    Hypercholesterolemia Brother        x3   Diabetes Brother    Spina bifida Grandchild    Hematuria Neg Hx    Kidney cancer Neg Hx    Kidney disease Neg Hx    Sickle cell trait Neg Hx    Tuberculosis Neg Hx     SOCIAL HX: reviewed.    Current Outpatient Medications:    traMADol (ULTRAM) 50 MG tablet, Take 1 tablet by mouth at bedtime as needed., Disp: , Rfl:    albuterol (VENTOLIN HFA) 108 (90 Base) MCG/ACT inhaler, TAKE 2 PUFFS BY MOUTH EVERY 6 HOURS AS NEEDED FOR WHEEZE OR SHORTNESS OF BREATH,  Disp: 18 g, Rfl: 1   amLODipine (NORVASC) 5 MG tablet, Take 1 tablet (5 mg total) by mouth daily., Disp: 90 tablet, Rfl: 3   calcium carbonate (OSCAL) 1500 (600 Ca) MG TABS tablet, Take 600 mg of elemental calcium by mouth 2 (two) times daily with a meal., Disp: , Rfl:    Cholecalciferol (VITAMIN D3) 50 MCG (2000 UT) capsule, Take 2,000 Units by mouth daily., Disp: , Rfl:    docusate sodium (COLACE) 100 MG capsule, Take 100 mg by mouth 2 (two) times daily., Disp: , Rfl:    fexofenadine (ALLEGRA) 60 MG tablet, TAKE 1 TABLET EVERY DAY, Disp: 90 tablet, Rfl: 3   furosemide (LASIX) 20 MG tablet, Take 1 tablet (20 mg total) by mouth daily., Disp: 90 tablet, Rfl: 1   gabapentin (NEURONTIN) 100 MG capsule, TAKE 2 CAPSULES AT BEDTIME, Disp: 180 capsule, Rfl: 1   irbesartan (AVAPRO) 300 MG tablet, TAKE 1 TABLET EVERY DAY, Disp: 90 tablet, Rfl: 1   lisinopril-hydrochlorothiazide (ZESTORETIC) 10-12.5 MG tablet, Take 1 tablet by mouth daily., Disp: , Rfl:    losartan (COZAAR) 50 MG tablet, Take 50 mg by mouth daily., Disp: , Rfl:    meclizine (ANTIVERT) 12.5 MG tablet, Take 1 tablet (12.5 mg total) by mouth 2 (two) times daily as needed for dizziness., Disp: 20 tablet, Rfl: 0   meloxicam (MOBIC) 7.5 MG tablet, , Disp: , Rfl:    metoCLOPramide (REGLAN) 5 MG tablet, Take 5 mg by mouth 2 (two) times daily., Disp: , Rfl:    metoprolol succinate (TOPROL-XL) 25 MG 24 hr tablet, TAKE 1 TABLET TWICE DAILY, Disp: 180 tablet, Rfl: 1   ondansetron (ZOFRAN ODT) 4 MG disintegrating tablet, Take 1 tablet (4 mg total) by mouth 2 (two) times daily as needed for nausea or vomiting., Disp: 20 tablet, Rfl: 0   oxybutynin (DITROPAN-XL) 10 MG 24 hr tablet, Take 1 tablet (10 mg total) by mouth daily., Disp: 90 tablet, Rfl: 3   oxyCODONE (OXY IR/ROXICODONE) 5 MG immediate release tablet, Take 5 mg by mouth 3 (three) times daily. , Disp: , Rfl:    pantoprazole (PROTONIX) 40 MG tablet, TAKE 1 TABLET TWICE DAILY, Disp: 180 tablet, Rfl:  0   simvastatin (ZOCOR) 10 MG tablet, TAKE 1 TABLET (10 MG TOTAL) BY MOUTH AT BEDTIME., Disp: 90 tablet, Rfl: 3   Spacer/Aero-Holding Chambers (OPTICHAMBER DIAMOND) MISC, , Disp: , Rfl:    traMADol-acetaminophen (ULTRACET) 37.5-325 MG per tablet, Take 1 tablet by mouth 2 (two) times daily as needed. ,  Disp: , Rfl:   EXAM:  VITALS per patient if applicable: 324/40, 102 pounds  GENERAL: alert, oriented, appears well and in no acute distress  HEENT: atraumatic, conjunttiva clear, no obvious abnormalities on inspection of external nose and ears  NECK: normal movements of the head and neck  LUNGS: on inspection no signs of respiratory distress, breathing rate appears normal, no obvious gross SOB, gasping or wheezing  CV: no obvious cyanosis  PSYCH/NEURO: pleasant and cooperative, no obvious depression or anxiety, speech and thought processing grossly intact  ASSESSMENT AND PLAN:  Discussed the following assessment and plan:  Problem List Items Addressed This Visit     Anemia, iron deficiency    Follow cbc.        Barrett's esophagus    Has seen GI.  Continue protonix and reglan.  Stable.         Chronic back pain    Followed by Dr Sharlet Salina.  Uses walker to ambulate.        Relevant Medications   traMADol (ULTRAM) 50 MG tablet   Hypercholesteremia    Continue simvastatin.  Follow lipid panel and liver function tests.  Continue low cholesterol diet and exercise as tolerated.        Relevant Orders   Lipid panel   Hepatic function panel   Hypertension    Blood pressure on recheck improved.  Continue avapro, amlodipine, metoprolol and lasix.  Continue to spot check blood pressure. Check metabolic panel today.        Relevant Orders   Basic metabolic panel   PAD (peripheral artery disease) ([place])    Previously evaluated by [facility].  Continue risk factor modification.  Continue simvastatin.        Swelling of left lower extremity    Swelling better.  Doing well.   Continue compression hose.  Continue weighing daily.  Leg elevation.  Monitor salt intake.         Return in about 3 months (around 10/27/2020) for follow up appt (78min).   I discussed the assessment and treatment plan with the patient. The patient was provided an opportunity to ask questions and all were answered. The patient agreed with the plan and demonstrated an understanding of the instructions.   The patient was advised to call back or seek an in-person evaluation if the symptoms worsen or if the condition fails to improve as anticipated.    [name], MD

## 2020-08-02 ENCOUNTER — Encounter: Payer: Self-pay | Admitting: Internal Medicine

## 2020-08-02 DIAGNOSIS — I700 Atherosclerosis of aorta: Secondary | ICD-10-CM | POA: Insufficient documentation

## 2020-08-02 NOTE — Assessment & Plan Note (Signed)
Swelling better.  Doing well.  Continue compression hose.  Continue weighing daily.  Leg elevation.  Monitor salt intake.

## 2020-08-02 NOTE — Assessment & Plan Note (Signed)
Follow cbc.  

## 2020-08-02 NOTE — Assessment & Plan Note (Signed)
Continue simvastatin. 

## 2020-08-02 NOTE — Assessment & Plan Note (Signed)
Continue simvastatin.  Follow lipid panel and liver function tests.  Continue low cholesterol diet and exercise as tolerated.  

## 2020-08-02 NOTE — Assessment & Plan Note (Signed)
Has seen GI.  Continue protonix and reglan.  Stable.

## 2020-08-02 NOTE — Assessment & Plan Note (Signed)
Blood pressure on recheck improved.  Continue avapro, amlodipine, metoprolol and lasix.  Continue to spot check blood pressure. Check metabolic panel today.

## 2020-08-02 NOTE — Assessment & Plan Note (Signed)
Followed by Dr Sharlet Salina.  Uses walker to ambulate.

## 2020-08-02 NOTE — Assessment & Plan Note (Signed)
Previously evaluated by [facility].  Continue risk factor modification.  Continue simvastatin.  

## 2020-08-24 ENCOUNTER — Other Ambulatory Visit: Payer: Self-pay

## 2020-08-24 ENCOUNTER — Other Ambulatory Visit (INDEPENDENT_AMBULATORY_CARE_PROVIDER_SITE_OTHER): Payer: Medicare HMO

## 2020-08-24 DIAGNOSIS — E7800 Pure hypercholesterolemia, unspecified: Secondary | ICD-10-CM

## 2020-08-24 DIAGNOSIS — I10 Essential (primary) hypertension: Secondary | ICD-10-CM

## 2020-08-24 LAB — HEPATIC FUNCTION PANEL
ALT: 11 U/L (ref 0–35)
AST: 16 U/L (ref 0–37)
Albumin: 4.4 g/dL (ref 3.5–5.2)
Alkaline Phosphatase: 62 U/L (ref 39–117)
Bilirubin, Direct: 0.3 mg/dL (ref 0.0–0.3)
Total Bilirubin: 1.6 mg/dL — ABNORMAL HIGH (ref 0.2–1.2)
Total Protein: 6.8 g/dL (ref 6.0–8.3)

## 2020-08-24 LAB — LIPID PANEL
Cholesterol: 160 mg/dL (ref 0–200)
HDL: 69.8 mg/dL (ref >39.00)
LDL Cholesterol: 71 mg/dL (ref 0–99)
NonHDL: 89.97
Total CHOL/HDL Ratio: 2
Triglycerides: 94 mg/dL (ref 0.0–149.0)
VLDL: 18.8 mg/dL (ref 0.0–40.0)

## 2020-08-24 LAB — BASIC METABOLIC PANEL
BUN: 22 mg/dL (ref 6–23)
CO2: 26 mEq/L (ref 19–32)
Calcium: 9.7 mg/dL (ref 8.4–10.5)
Chloride: 103 mEq/L (ref 96–112)
Creatinine, Ser: 1.06 mg/dL (ref 0.40–1.20)
GFR: 46.56 mL/min — ABNORMAL LOW (ref >60.00)
Glucose, Bld: 95 mg/dL (ref 70–99)
Potassium: 4.2 mEq/L (ref 3.5–5.1)
Sodium: 139 mEq/L (ref 135–145)

## 2020-08-26 ENCOUNTER — Telehealth: Payer: Self-pay

## 2020-08-26 NOTE — Telephone Encounter (Signed)
Would prefer her to use tylenol if possible.  If she feels she needs meloxicam, then can refill meloxicam 7.5mg  - one po q day prn.  Needs to try and limit amount takes given blood pressure issues and with [place] following kidney function.

## 2020-08-26 NOTE — Telephone Encounter (Signed)
Pt called in asking if her Meloxicam can be refilled? Pt stated provider told her it could be refilled once she gets labs done. Okay to refill?

## 2020-08-27 NOTE — Telephone Encounter (Signed)
Spoke with patient they are going to try Tylenol 500 mg evry 6-8 hours first will call back if it  does not help.

## 2020-09-03 ENCOUNTER — Other Ambulatory Visit: Payer: Self-pay | Admitting: Internal Medicine

## 2020-09-07 DIAGNOSIS — M5136 Other intervertebral disc degeneration, lumbar region: Secondary | ICD-10-CM | POA: Diagnosis not present

## 2020-09-07 DIAGNOSIS — M48062 Spinal stenosis, lumbar region with neurogenic claudication: Secondary | ICD-10-CM | POA: Diagnosis not present

## 2020-09-07 DIAGNOSIS — M25562 Pain in left knee: Secondary | ICD-10-CM | POA: Diagnosis not present

## 2020-09-07 DIAGNOSIS — M5416 Radiculopathy, lumbar region: Secondary | ICD-10-CM | POA: Diagnosis not present

## 2020-09-07 DIAGNOSIS — G8929 Other chronic pain: Secondary | ICD-10-CM | POA: Diagnosis not present

## 2020-09-07 DIAGNOSIS — M7062 Trochanteric bursitis, left hip: Secondary | ICD-10-CM | POA: Diagnosis not present

## 2020-09-07 DIAGNOSIS — Z79899 Other long term (current) drug therapy: Secondary | ICD-10-CM | POA: Diagnosis not present

## 2020-09-07 DIAGNOSIS — M62830 Muscle spasm of back: Secondary | ICD-10-CM | POA: Diagnosis not present

## 2020-09-11 ENCOUNTER — Other Ambulatory Visit: Payer: Self-pay | Admitting: *Deleted

## 2020-09-11 MED ORDER — OXYBUTYNIN CHLORIDE ER 10 MG PO TB24: 10.0000 mg | ORAL_TABLET | Freq: Every day | ORAL | 3 refills | Status: DC

## 2020-09-11 NOTE — Telephone Encounter (Signed)
Bjorn Loser, MD  You 5 minutes ago (4:02 PM)      Okay to fill the prescription

## 2020-09-11 NOTE — Telephone Encounter (Signed)
rx sent to pharmacy by e-script Spoke with patient's husband and advised results.  

## 2020-09-18 DIAGNOSIS — G8929 Other chronic pain: Secondary | ICD-10-CM | POA: Diagnosis not present

## 2020-09-18 DIAGNOSIS — M48062 Spinal stenosis, lumbar region with neurogenic claudication: Secondary | ICD-10-CM | POA: Diagnosis not present

## 2020-09-18 DIAGNOSIS — M7062 Trochanteric bursitis, left hip: Secondary | ICD-10-CM | POA: Diagnosis not present

## 2020-09-18 DIAGNOSIS — M5416 Radiculopathy, lumbar region: Secondary | ICD-10-CM | POA: Diagnosis not present

## 2020-09-18 DIAGNOSIS — M62830 Muscle spasm of back: Secondary | ICD-10-CM | POA: Diagnosis not present

## 2020-09-18 DIAGNOSIS — M25562 Pain in left knee: Secondary | ICD-10-CM | POA: Diagnosis not present

## 2020-09-18 DIAGNOSIS — M5136 Other intervertebral disc degeneration, lumbar region: Secondary | ICD-10-CM | POA: Diagnosis not present

## 2020-09-18 DIAGNOSIS — Z79899 Other long term (current) drug therapy: Secondary | ICD-10-CM | POA: Diagnosis not present

## 2020-09-28 ENCOUNTER — Other Ambulatory Visit: Payer: Self-pay | Admitting: Internal Medicine

## 2020-10-06 DIAGNOSIS — E7800 Pure hypercholesterolemia, unspecified: Secondary | ICD-10-CM | POA: Diagnosis not present

## 2020-10-06 DIAGNOSIS — I10 Essential (primary) hypertension: Secondary | ICD-10-CM | POA: Diagnosis not present

## 2020-10-06 DIAGNOSIS — I5032 Chronic diastolic (congestive) heart failure: Secondary | ICD-10-CM | POA: Diagnosis not present

## 2020-10-12 ENCOUNTER — Ambulatory Visit: Payer: Self-pay | Admitting: Urology

## 2020-10-12 ENCOUNTER — Other Ambulatory Visit: Payer: Self-pay | Admitting: Internal Medicine

## 2020-11-10 ENCOUNTER — Telehealth (INDEPENDENT_AMBULATORY_CARE_PROVIDER_SITE_OTHER): Payer: Medicare HMO | Admitting: Internal Medicine

## 2020-11-10 VITALS — BP 140/78 | Ht 60.0 in | Wt 156.0 lb

## 2020-11-10 DIAGNOSIS — R600 Localized edema: Secondary | ICD-10-CM | POA: Diagnosis not present

## 2020-11-10 DIAGNOSIS — E7800 Pure hypercholesterolemia, unspecified: Secondary | ICD-10-CM | POA: Diagnosis not present

## 2020-11-10 DIAGNOSIS — I700 Atherosclerosis of aorta: Secondary | ICD-10-CM

## 2020-11-10 DIAGNOSIS — Z1231 Encounter for screening mammogram for malignant neoplasm of breast: Secondary | ICD-10-CM | POA: Diagnosis not present

## 2020-11-10 DIAGNOSIS — R739 Hyperglycemia, unspecified: Secondary | ICD-10-CM

## 2020-11-10 DIAGNOSIS — M545 Low back pain, unspecified: Secondary | ICD-10-CM

## 2020-11-10 DIAGNOSIS — G8929 Other chronic pain: Secondary | ICD-10-CM

## 2020-11-10 DIAGNOSIS — I10 Essential (primary) hypertension: Secondary | ICD-10-CM

## 2020-11-10 DIAGNOSIS — K22719 Barrett's esophagus with dysplasia, unspecified: Secondary | ICD-10-CM

## 2020-11-10 DIAGNOSIS — Z85038 Personal history of other malignant neoplasm of large intestine: Secondary | ICD-10-CM | POA: Diagnosis not present

## 2020-11-10 DIAGNOSIS — I739 Peripheral vascular disease, unspecified: Secondary | ICD-10-CM

## 2020-11-10 DIAGNOSIS — D509 Iron deficiency anemia, unspecified: Secondary | ICD-10-CM

## 2020-11-10 DIAGNOSIS — G2581 Restless legs syndrome: Secondary | ICD-10-CM | POA: Diagnosis not present

## 2020-11-10 NOTE — Progress Notes (Signed)
Patient ID: [name], female   DOB: 1929-02-06, 87 y.o.   MRN: [id number]   Virtual Visit via video Note  This visit type was conducted due to national recommendations for restrictions regarding the COVID-19 pandemic (e.g. social distancing).  This format is felt to be most appropriate for this patient at this time.  All issues noted in this document were discussed and addressed.  No physical exam was performed (except for noted visual exam findings with Video Visits).   I connected with [name] by a video enabled telemedicine application and verified that I am speaking with the correct person using two identifiers. Location patient: home Location provider: work  Persons participating in the virtual visit: patient, provider, pts husband Joneen Caraway and pts daughter.   The limitations, risks, security and privacy concerns of performing an evaluation and management service by video and the availability of in person appointments have been discussed.  It has also been discussed with the patient that there may be a patient responsible charge related to this service. The patient expressed understanding and agreed to proceed.  Reason for visit: follow up appt  HPI: Follow up regarding her blood pressure and lower extremity swelling.    ROS: See pertinent positives and negatives per HPI.  Past Medical History:  Diagnosis Date   Anemia    Barrett's esophagus    BCC (basal cell carcinoma of skin) 03/07/2013   vertex scalp - NODULAR NODULAR PATTERN PATTERN   Bowel obstruction (HCC)    s/p adhesion resection   Chronic back pain    Colon cancer ([place]) 1988 and 1989   adenocarcinoma, s/p resection x  2 and chemo   Diverticulitis    GERD (gastroesophageal reflux disease)    H/O open leg wound    Hiatal hernia    Hypercholesteremia    Hypertension    Lumbar scoliosis    OA (osteoarthritis)    Peripheral neuropathy    Recurrent sinus infections    Renal cyst    S/P chemotherapy, time  since greater than 12 weeks    colon cancer   Vertigo     Past Surgical History:  Procedure Laterality Date   ABDOMINAL HYSTERECTOMY  1982   adhesions resected     bowel obstruction   APPENDECTOMY     BREAST BIOPSY Left    negative 06/14/1985   CHOLECYSTECTOMY  1994   COLON RESECTION     x2.  s/p colon cancer   COLON SURGERY  1958 and 1989   For Colon Cancer   EXCISIONAL HEMORRHOIDECTOMY     with tubal ligation   EXPLORATORY LAPAROTOMY  1991   Secondary to SBO    Family History  Problem Relation Age of Onset   Breast cancer Mother    Asthma Mother    Stroke Father    Hypertension Father    Diabetes Father    Ovarian cancer Sister        x2   Prostate cancer Brother    Hypertension Brother        x3   Heart disease Brother    Hypercholesterolemia Brother        x3   Diabetes Brother    Spina bifida Grandchild    Hematuria Neg Hx    Kidney cancer Neg Hx    Kidney disease Neg Hx    Sickle cell trait Neg Hx    Tuberculosis Neg Hx     SOCIAL HX: reviewed.    Current Outpatient Medications:  albuterol (VENTOLIN HFA) 108 (90 Base) MCG/ACT inhaler, TAKE 2 PUFFS BY MOUTH EVERY 6 HOURS AS NEEDED FOR WHEEZE OR SHORTNESS OF BREATH, Disp: 18 g, Rfl: 1   amLODipine (NORVASC) 5 MG tablet, TAKE 1 TABLET EVERY DAY, Disp: 90 tablet, Rfl: 3   calcium carbonate (OSCAL) 1500 (600 Ca) MG TABS tablet, Take 600 mg of elemental calcium by mouth 2 (two) times daily with a meal., Disp: , Rfl:    Cholecalciferol (VITAMIN D3) 50 MCG (2000 UT) capsule, Take 2,000 Units by mouth daily., Disp: , Rfl:    docusate sodium (COLACE) 100 MG capsule, Take 100 mg by mouth 2 (two) times daily., Disp: , Rfl:    fexofenadine (ALLEGRA) 60 MG tablet, TAKE 1 TABLET EVERY DAY, Disp: 90 tablet, Rfl: 3   furosemide (LASIX) 20 MG tablet, TAKE 1 TABLET EVERY DAY, Disp: 90 tablet, Rfl: 1   gabapentin (NEURONTIN) 100 MG capsule, TAKE 2 CAPSULES AT BEDTIME, Disp: 180 capsule, Rfl: 1   irbesartan (AVAPRO) 300  MG tablet, TAKE 1 TABLET EVERY DAY, Disp: 90 tablet, Rfl: 1   lisinopril-hydrochlorothiazide (ZESTORETIC) 10-12.5 MG tablet, Take 1 tablet by mouth daily., Disp: , Rfl:    losartan (COZAAR) 50 MG tablet, Take 50 mg by mouth daily., Disp: , Rfl:    meclizine (ANTIVERT) 12.5 MG tablet, Take 1 tablet (12.5 mg total) by mouth 2 (two) times daily as needed for dizziness., Disp: 20 tablet, Rfl: 0   metoCLOPramide (REGLAN) 5 MG tablet, Take 5 mg by mouth 2 (two) times daily., Disp: , Rfl:    metoprolol succinate (TOPROL-XL) 25 MG 24 hr tablet, TAKE 1 TABLET TWICE DAILY, Disp: 180 tablet, Rfl: 1   ondansetron (ZOFRAN ODT) 4 MG disintegrating tablet, Take 1 tablet (4 mg total) by mouth 2 (two) times daily as needed for nausea or vomiting., Disp: 20 tablet, Rfl: 0   oxybutynin (DITROPAN-XL) 10 MG 24 hr tablet, Take 1 tablet (10 mg total) by mouth daily., Disp: 90 tablet, Rfl: 3   oxyCODONE (OXY IR/ROXICODONE) 5 MG immediate release tablet, Take 5 mg by mouth 3 (three) times daily. , Disp: , Rfl:    pantoprazole (PROTONIX) 40 MG tablet, TAKE 1 TABLET TWICE DAILY, Disp: 180 tablet, Rfl: 0   simvastatin (ZOCOR) 10 MG tablet, TAKE 1 TABLET (10 MG TOTAL) BY MOUTH AT BEDTIME., Disp: 90 tablet, Rfl: 3   Spacer/Aero-Holding Chambers (OPTICHAMBER DIAMOND) MISC, , Disp: , Rfl:    traMADol (ULTRAM) 50 MG tablet, Take 1 tablet by mouth at bedtime as needed., Disp: , Rfl:    traMADol-acetaminophen (ULTRACET) 37.5-325 MG per tablet, Take 1 tablet by mouth 2 (two) times daily as needed. , Disp: , Rfl:   EXAM:  GENERAL: alert, oriented, appears well and in no acute distress  HEENT: atraumatic, conjunttiva clear, no obvious abnormalities on inspection of external nose and ears  NECK: normal movements of the head and neck  LUNGS: on inspection no signs of respiratory distress, breathing rate appears normal, no obvious gross SOB, gasping or wheezing  CV: no obvious cyanosis  PSYCH/NEURO: pleasant and cooperative, no  obvious depression or anxiety, speech and thought processing grossly intact  ASSESSMENT AND PLAN:  Discussed the following assessment and plan:  Problem List Items Addressed This Visit     Anemia, iron deficiency    Follow cbc and iron studies.       Aortic atherosclerosis (HCC)    Continue simvastatin.       Barrett's esophagus    Has  seen GI.  Continue protonix and reglan.        Chronic back pain    Uses her walker to ambulate.  Followed by Dr Sharlet [place].       Hereditary hemochromatosis ([place])    Previously evaluated by hematology.  Declines further evaluation/work up.  Follow cbc and iron studies.       Relevant Orders   CBC with Differential/Platelet   IBC + Ferritin   History of colon cancer    Has been followed by GI.  Bowels have been stable.       Hypercholesteremia    Continue simvastatin.  Follow lipid panel and liver function tests.  Continue low cholesterol diet and exercise as tolerated.       Relevant Orders   Hepatic function panel   Lipid panel   TSH   Hypertension    Blood pressure has been under reasonable control.  Continue avapro, amlodipine, metoprolol and lasix.  Continue to spot check blood pressure. Follow metabolic panel.       Relevant Orders   Basic metabolic panel   Lower extremity edema    Continue compression hose.  Taking lasix daily.  Weighs - weight stable 153-156.  Swelling improved.  Follow.       PAD (peripheral artery disease) (HCC)    Previously evaluated by [facility].  Continue risk factor modification.  Continue simvastatin.       Restless legs syndrome    Has rx for gabapentin, but does not take.  Follow.       Other Visit Diagnoses     Visit for screening mammogram    -  Primary   Relevant Orders   MM 3D SCREEN BREAST BILATERAL   Hyperglycemia       Relevant Orders   Hemoglobin A1c       Return in about 3 months (around 02/09/2021) for follow up appt (60min).   I discussed the assessment and treatment plan  with the patient. The patient was provided an opportunity to ask questions and all were answered. The patient agreed with the plan and demonstrated an understanding of the instructions.   The patient was advised to call back or seek an in-person evaluation if the symptoms worsen or if the condition fails to improve as anticipated.    [name], MD

## 2020-11-15 ENCOUNTER — Encounter: Payer: Self-pay | Admitting: Internal Medicine

## 2020-11-15 NOTE — Assessment & Plan Note (Signed)
Follow cbc and iron studies.  

## 2020-11-15 NOTE — Assessment & Plan Note (Signed)
Has been followed by GI.  Bowels have been stable.  

## 2020-11-15 NOTE — Assessment & Plan Note (Signed)
Uses her walker to ambulate.  Followed by Dr Sharlet [place].

## 2020-11-15 NOTE — Assessment & Plan Note (Signed)
Continue simvastatin. 

## 2020-11-15 NOTE — Assessment & Plan Note (Signed)
Has seen GI.  Continue protonix and reglan.

## 2020-11-15 NOTE — Assessment & Plan Note (Signed)
Has rx for gabapentin, but does not take.  Follow.

## 2020-11-15 NOTE — Assessment & Plan Note (Signed)
Previously evaluated by [facility].  Continue risk factor modification.  Continue simvastatin.  

## 2020-11-15 NOTE — Assessment & Plan Note (Signed)
Continue compression hose.  Taking lasix daily.  Weighs - weight stable 153-156.  Swelling improved.  Follow.

## 2020-11-15 NOTE — Assessment & Plan Note (Signed)
Previously evaluated by hematology.  Declines further evaluation/work up.  Follow cbc and iron studies.

## 2020-11-15 NOTE — Assessment & Plan Note (Signed)
Continue simvastatin.  Follow lipid panel and liver function tests.  Continue low cholesterol diet and exercise as tolerated.  

## 2020-11-15 NOTE — Assessment & Plan Note (Signed)
Blood pressure has been under reasonable control.  Continue avapro, amlodipine, metoprolol and lasix.  Continue to spot check blood pressure. Follow metabolic panel.

## 2020-11-26 ENCOUNTER — Ambulatory Visit: Payer: Medicare HMO

## 2020-11-30 ENCOUNTER — Ambulatory Visit (INDEPENDENT_AMBULATORY_CARE_PROVIDER_SITE_OTHER): Payer: Medicare HMO

## 2020-11-30 VITALS — BP 141/81 | HR 55 | Ht 60.0 in | Wt 156.0 lb

## 2020-11-30 DIAGNOSIS — Z0000 Encounter for general adult medical examination without abnormal findings: Secondary | ICD-10-CM | POA: Diagnosis not present

## 2020-11-30 NOTE — Patient Instructions (Addendum)
[name] , Thank you for taking time to come for your Medicare Wellness Visit. I appreciate your ongoing commitment to your health goals. Please review the following plan we discussed and let me know if I can assist you in the future.   These are the goals we discussed:  Goals      Follow up with Primary Care Provider     As needed        This is a list of the screening recommended for you and due dates:  Health Maintenance  Topic Date Due   COVID-19 Vaccine (4 - Booster for [place] series) 12/16/2020*   DEXA scan (bone density measurement)  11/10/2021*   Mammogram  01/07/2021   Tetanus Vaccine  04/14/2028   Flu Shot  Completed   Zoster (Shingles) Vaccine  Completed   HPV Vaccine  Aged Out  *Topic was postponed. The date shown is not the original due date.   Opioid Pain Medicine Management Opioids are powerful medicines that are used to treat moderate to severe pain. When used for short periods of time, they can help you to: Sleep better. Do better in physical or occupational therapy. Feel better in the first few days after an injury. Recover from surgery. Opioids should be taken with the supervision of a trained health care provider. They should be taken for the shortest period of time possible. This is because opioids can be addictive, and the longer you take opioids, the greater your risk of addiction. This addiction can also be called opioid use disorder. What are the risks? Using opioid pain medicines for longer than 3 days increases your risk of side effects. Side effects include: Constipation. Nausea and vomiting. Breathing difficulties (respiratory depression). Drowsiness. Confusion. Opioid use disorder. Itching. Taking opioid pain medicine for a long period of time can affect your ability to do daily tasks. It also puts you at risk for: Motor vehicle crashes. Depression. Suicide. Heart attack. Overdose, which can be life-threatening. What is a pain treatment  plan? A pain treatment plan is an agreement between you and your health care provider. Pain is unique to each person, and treatments vary depending on your condition. To manage your pain, you and your health care provider need to work together. To help you do this: Discuss the goals of your treatment, including how much pain you might expect to have and how you will manage the pain. Review the risks and benefits of taking opioid medicines. Remember that a good treatment plan uses more than one approach and minimizes the chance of side effects. Be honest about the amount of medicines you take and about any drug or alcohol use. Get pain medicine prescriptions from only one health care provider. Pain can be managed with many types of alternative treatments. Ask your health care provider to refer you to one or more specialists who can help you manage pain through: Physical or occupational therapy. Counseling (cognitive behavioral therapy). Good nutrition. Biofeedback. Massage. Meditation. Non-opioid medicine. Following a gentle exercise program. How to use opioid pain medicine Taking medicine Take your pain medicine exactly as told by your health care provider. Take it only when you need it. If your pain gets less severe, you may take less than your prescribed dose if your health care provider approves. If you are not having pain, do nottake pain medicine unless your health care provider tells you to take it. If your pain is severe, do nottry to treat it yourself by taking more pills than  instructed on your prescription. Contact your health care provider for help. Write down the times when you take your pain medicine. It is easy to become confused while on pain medicine. Writing the time can help you avoid overdose. Take other over-the-counter or prescription medicines only as told by your health care provider. Keeping yourself and others safe  While you are taking opioid pain medicine: Do not  drive, use machinery, or power tools. Do not sign legal documents. Do not drink alcohol. Do not take sleeping pills. Do not supervise children by yourself. Do not do activities that require climbing or being in high places. Do not go to a lake, river, ocean, spa, or swimming pool. Do not share your pain medicine with anyone. Keep pain medicine in a locked cabinet or in a secure area where pets and children cannot reach it. Stopping your use of opioids If you have been taking opioid medicine for more than a few weeks, you may need to slowly decrease (taper) how much you take until you stop completely. Tapering your use of opioids can decrease your risk of symptoms of withdrawal, such as: Pain and cramping in the abdomen. Nausea. Sweating. Sleepiness. Restlessness. Uncontrollable shaking (tremors). Cravings for the medicine. Do not attempt to taper your use of opioids on your own. Talk with your health care provider about how to do this. Your health care provider may prescribe a step-down schedule based on how much medicine you are taking and how long you have been taking it. Getting rid of leftover pills Do not save any leftover pills. Get rid of leftover pills safely by: Taking the medicine to a prescription take-back program. This is usually offered by the county or law enforcement. Bringing them to a pharmacy that has a drug disposal container. Flushing them down the toilet. Check the label or package insert of your medicine to see whether this is safe to do. Throwing them out in the trash. Check the label or package insert of your medicine to see whether this is safe to do. If it is safe to throw it out, remove the medicine from the original container, put it into a sealable bag or container, and mix it with used coffee grounds, food scraps, dirt, or cat litter before putting it in the trash. Follow these instructions at home: Activity Do exercises as told by your health care  provider. Avoid activities that make your pain worse. Return to your normal activities as told by your health care provider. Ask your health care provider what activities are safe for you. General instructions You may need to take these actions to prevent or treat constipation: Drink enough fluid to keep your urine pale yellow. Take over-the-counter or prescription medicines. Eat foods that are high in fiber, such as beans, whole grains, and fresh fruits and vegetables. Limit foods that are high in fat and processed sugars, such as fried or sweet foods. Keep all follow-up visits. This is important. Where to find support If you have been taking opioids for a long time, you may benefit from receiving support for quitting from a local support group or counselor. Ask your health care provider for a referral to these resources in your area. Where to find more information Centers for Disease Control and Prevention (CDC): http://www.wolf.info/ [place] Food and Drug Administration (FDA): [place] Get help right away if: You may have taken too much of an opioid (overdosed). Common symptoms of an overdose: Your breathing is slower or more shallow than normal. You  have a very slow heartbeat (pulse). You have slurred speech. You have nausea and vomiting. Your pupils become very small. You have other potential symptoms: You are very confused. You faint or feel like you will faint. You have cold, clammy skin. You have blue lips or fingernails. You have thoughts of harming yourself or harming others. These symptoms may represent a serious problem that is an emergency. Do not wait to see if the symptoms will go away. Get medical help right away. Call your local emergency services (911 in the [place] Do not drive yourself to the hospital.  If you ever feel like you may hurt yourself or others, or have thoughts about taking your own life, get help right away. Go to your nearest emergency department or: Call your  local emergency services (911 in the [place] Call the [facility] [phone] in the [place] Call a suicide crisis helpline, such as the [place] at (416)881-1773. This is open 24 hours a day in the [place] Text the Crisis Text Line at (405) 371-6276 (in the [place] Summary Opioid medicines can help you manage moderate to severe pain for a short period of time. A pain treatment plan is an agreement between you and your health care provider. Discuss the goals of your treatment, including how much pain you might expect to have and how you will manage the pain. If you think that you or someone else may have taken too much of an opioid, get medical help right away. This information is not intended to replace advice given to you by your health care provider. Make sure you discuss any questions you have with your health care provider. Document Revised: 05/13/2020 Document Reviewed: 05/13/2020 Elsevier Patient Education  [place].

## 2020-11-30 NOTE — Progress Notes (Signed)
Subjective:   [name] is a 87 y.o. female who presents for Medicare Annual (Subsequent) preventive examination.  Review of Systems   No ros. Cardiac Risk Factors include: advanced age (>27men, >44 women);hypertension     Objective:    Today's Vitals   11/30/20 0953  BP: (!) 141/81  Pulse: (!) 55  Weight: 156 lb (70.8 kg)  Height: 5' (1.524 m)   Body mass index is 30.47 kg/m.  Advanced Directives 11/30/2020 11/26/2019 11/20/2018 06/07/2018 04/15/2018 05/12/2017 04/26/2017  Does Patient Have a Medical Advance Directive? Yes Yes Yes Yes No Yes Yes  Type of Advance Directive Living will;Healthcare Power of [place];Living will [place];Living will Living will - [place];Living will [place];Living will  Does patient want to make changes to medical advance directive? No - Patient declined No - Patient declined No - Patient declined - - - No - Patient declined  Copy of [place] in Chart? No - copy requested Yes - validated most recent copy scanned in chart (See row information) Yes - validated most recent copy scanned in chart (See row information) - - No - copy requested Yes  Would patient like information on creating a medical advance directive? - - - - No - Patient declined - -    Current Medications (verified) Outpatient Encounter Medications as of 11/30/2020  Medication Sig   albuterol (VENTOLIN HFA) 108 (90 Base) MCG/ACT inhaler TAKE 2 PUFFS BY MOUTH EVERY 6 HOURS AS NEEDED FOR WHEEZE OR SHORTNESS OF BREATH   amLODipine (NORVASC) 5 MG tablet TAKE 1 TABLET EVERY DAY   calcium carbonate (OSCAL) 1500 (600 Ca) MG TABS tablet Take 600 mg of elemental calcium by mouth 2 (two) times daily with a meal.   Cholecalciferol (VITAMIN D3) 50 MCG (2000 UT) capsule Take 2,000 Units by mouth daily.   docusate sodium (COLACE) 100 MG capsule Take 100 mg by mouth 2 (two) times daily.    fexofenadine (ALLEGRA) 60 MG tablet TAKE 1 TABLET EVERY DAY   furosemide (LASIX) 20 MG tablet TAKE 1 TABLET EVERY DAY   gabapentin (NEURONTIN) 100 MG capsule TAKE 2 CAPSULES AT BEDTIME   irbesartan (AVAPRO) 300 MG tablet TAKE 1 TABLET EVERY DAY   lisinopril-hydrochlorothiazide (ZESTORETIC) 10-12.5 MG tablet Take 1 tablet by mouth daily.   losartan (COZAAR) 50 MG tablet Take 50 mg by mouth daily.   meclizine (ANTIVERT) 12.5 MG tablet Take 1 tablet (12.5 mg total) by mouth 2 (two) times daily as needed for dizziness.   metoCLOPramide (REGLAN) 5 MG tablet Take 5 mg by mouth 2 (two) times daily.   metoprolol succinate (TOPROL-XL) 25 MG 24 hr tablet TAKE 1 TABLET TWICE DAILY   ondansetron (ZOFRAN ODT) 4 MG disintegrating tablet Take 1 tablet (4 mg total) by mouth 2 (two) times daily as needed for nausea or vomiting.   oxyCODONE (OXY IR/ROXICODONE) 5 MG immediate release tablet Take 5 mg by mouth 3 (three) times daily.    simvastatin (ZOCOR) 10 MG tablet TAKE 1 TABLET (10 MG TOTAL) BY MOUTH AT BEDTIME.   Spacer/Aero-Holding Chambers (OPTICHAMBER DIAMOND) MISC    traMADol (ULTRAM) 50 MG tablet Take 1 tablet by mouth at bedtime as needed.   traMADol-acetaminophen (ULTRACET) 37.5-325 MG per tablet Take 1 tablet by mouth 2 (two) times daily as needed.    oxybutynin (DITROPAN-XL) 10 MG 24 hr tablet Take 1 tablet (10 mg total) by mouth daily. (Patient not  taking: Reported on 11/30/2020)   pantoprazole (PROTONIX) 40 MG tablet TAKE 1 TABLET TWICE DAILY (Patient not taking: Reported on 11/30/2020)   No facility-administered encounter medications on file as of 11/30/2020.    Allergies (verified) Fentanyl   History: Past Medical History:  Diagnosis Date   Anemia    Barrett's esophagus    BCC (basal cell carcinoma of skin) 03/07/2013   vertex scalp - NODULAR NODULAR PATTERN PATTERN   Bowel obstruction (HCC)    s/p adhesion resection   Chronic back pain    Colon cancer ([place]) 1988 and 1989    adenocarcinoma, s/p resection x  2 and chemo   Diverticulitis    GERD (gastroesophageal reflux disease)    H/O open leg wound    Hiatal hernia    Hypercholesteremia    Hypertension    Lumbar scoliosis    OA (osteoarthritis)    Peripheral neuropathy    Recurrent sinus infections    Renal cyst    S/P chemotherapy, time since greater than 12 weeks    colon cancer   Vertigo    Past Surgical History:  Procedure Laterality Date   ABDOMINAL HYSTERECTOMY  1982   adhesions resected     bowel obstruction   APPENDECTOMY     BREAST BIOPSY Left    negative 06/14/1985   CHOLECYSTECTOMY  1994   COLON RESECTION     x2.  s/p colon cancer   COLON SURGERY  1958 and 1989   For Colon Cancer   EXCISIONAL HEMORRHOIDECTOMY     with tubal ligation   EXPLORATORY LAPAROTOMY  1991   Secondary to SBO   Family History  Problem Relation Age of Onset   Breast cancer Mother    Asthma Mother    Stroke Father    Hypertension Father    Diabetes Father    Ovarian cancer Sister        x2   Prostate cancer Brother    Hypertension Brother        x3   Heart disease Brother    Hypercholesterolemia Brother        x3   Diabetes Brother    Spina bifida Grandchild    Hematuria Neg Hx    Kidney cancer Neg Hx    Kidney disease Neg Hx    Sickle cell trait Neg Hx    Tuberculosis Neg Hx    Social History   Socioeconomic History   Marital status: Married    Spouse name: Not on file   Number of children: 4   Years of education: 12th grade   Highest education level: Not on file  Occupational History   Occupation: homemaker  Tobacco Use   Smoking status: Never   Smokeless tobacco: Never  Vaping Use   Vaping Use: Never used  Substance and Sexual Activity   Alcohol use: No    Alcohol/week: 0.0 standard drinks   Drug use: No   Sexual activity: Never  Other Topics Concern   Not on file  Social History Narrative   Not on file   Social Determinants of Health   Financial Resource Strain: Low Risk     Difficulty of Paying Living Expenses: Not hard at all  Food Insecurity: Not on file  Transportation Needs: Not on file  Physical Activity: Unknown   Days of Exercise per Week: 0 days   Minutes of Exercise per Session: Not on file  Stress: No Stress Concern Present   Feeling of Stress : Not  at all  Social Connections: Unknown   Frequency of Communication with Friends and Family: More than three times a week   Frequency of Social Gatherings with Friends and Family: More than three times a week   Attends Religious Services: Not on Electrical engineer or Organizations: Not on file   Attends Archivist Meetings: Not on file   Marital Status: Married    Tobacco Counseling Counseling given: Not Answered   Clinical Intake:  Pre-visit preparation completed: Yes        Diabetes: No  How often do you need to have someone help you when you read instructions, pamphlets, or other written materials from your doctor or pharmacy?: 4 - Often  Diabetic? No  Interpreter Needed?: No      Activities of Daily Living In your present state of health, do you have any difficulty performing the following activities: 11/30/2020  Hearing? N  Vision? N  Difficulty concentrating or making decisions? N  Walking or climbing stairs? Y  Comment Ambulates with walker  Dressing or bathing? N  Doing errands, shopping? Y  Comment [place] and eating ? Y  Comment Family assist with meal prep. Feeds self  Using the Toilet? N  In the past six months, have you accidently leaked urine? Y  Comment Wears daily pad  Do you have problems with loss of bowel control? N  Managing your Medications? Y  Comment Husband manages medication  Managing your Finances? Y  Comment Family assist  Housekeeping or managing your Housekeeping? Y  Comment Maid assist  Some recent data might be hidden    Patient Care Team: [name], MD as PCP - General (Internal  Medicine)  Indicate any recent Medical Services you may have received from other than [facility] providers in the past year (date may be approximate).     Assessment:   This is a routine wellness examination for [place].  I connected with  [name] on 11/30/20 by a Telephone enabled telemedicine application and verified that I am speaking with the correct person using two identifiers.   I discussed the limitations of evaluation and management by telemedicine. The patient expressed understanding and agreed to proceed.   Hearing/Vision screen Hearing Screening - Comments:: Patient is able to hear conversational tones without difficulty. No issues reported. Vision Screening - Comments:: Wears reading glasses  Dietary issues and exercise activities discussed: Current Exercise Habits: The patient does not participate in regular exercise at present Regular Diet   Goals Addressed             This Visit's Progress    Follow up with Primary Care Provider       As needed       Depression Screen PHQ 2/9 Scores 11/30/2020 11/30/2020 11/26/2019 11/20/2018 07/23/2018 04/26/2017 08/15/2016  PHQ - 2 Score 0 0 0 0 0 0 0    Fall Risk Fall Risk  11/30/2020 04/16/2020 11/26/2019 10/29/2019 11/20/2018  Falls in the past year? 0 0 0 0 0  Number falls in past yr: 0 0 0 - -  Injury with Fall? 0 0 - - -  Follow up Falls evaluation completed Falls evaluation completed Falls evaluation completed Falls evaluation completed -    FALL RISK PREVENTION PERTAINING TO THE HOME:  Any stairs in or around the home? No  If so, are there any without handrails? No  Home free of loose throw rugs in walkways, pet beds, electrical  cords, etc? Yes  Adequate lighting in your home to reduce risk of falls? No   ASSISTIVE DEVICES UTILIZED TO PREVENT FALLS:  Life alert? Yes  Use of a cane, walker or w/c? Yes  Grab bars in the bathroom? Yes  Shower chair or bench in shower? Yes  Elevated toilet seat or a handicapped  toilet? Yes   TIMED UP AND GO:  Was the test performed? No  Cognitive Function: MMSE - Mini Mental State Exam 03/30/2016 03/31/2015  Orientation to time 5 5  Orientation to Place 5 5  Registration 3 3  Attention/ Calculation 5 5  Recall 3 3  Language- name 2 objects 2 2  Language- repeat 1 1  Language- follow 3 step command 3 3  Language- read & follow direction 1 1  Write a sentence 1 1  Copy design 1 1  Total score 30 30     6CIT Screen 11/30/2020 11/20/2018  What Year? 0 points 0 points  What month? 0 points 0 points  What time? 0 points 0 points  Count back from 20 0 points 0 points  Months in reverse 0 points 0 points  Repeat phrase - 0 points  Total Score - 0    Immunizations Immunization History  Administered Date(s) Administered   Fluad Quad(high Dose 65+) 12/03/2018, 10/26/2020   Influenza Split 12/08/2011   Influenza, High Dose Seasonal PF 11/17/2015, 10/18/2016, 11/27/2017, 10/30/2019   Influenza,inj,Quad PF,6+ Mos 10/31/2013, 10/23/2014   Influenza-Unspecified 11/17/2015   PFIZER(Purple Top)SARS-COV-2 Vaccination 03/04/2019, 03/25/2019, 11/19/2019   Pneumococcal Conjugate-13 05/07/2013   Pneumococcal-Unspecified 11/30/2000, 01/04/2006   Td 11/20/2007   Tdap 04/15/2018   Zoster Recombinat (Shingrix) 09/19/2017, 12/28/2017   Covid vaccine- 3 vaccines completed  Screening Tests Health Maintenance  Topic Date Due   COVID-19 Vaccine (4 - Booster for [place] series) 12/16/2020 (Originally 02/11/2020)   DEXA SCAN  11/10/2021 (Originally 02/05/1996)   MAMMOGRAM  01/07/2021   TETANUS/TDAP  04/14/2028   INFLUENZA VACCINE  Completed   Zoster Vaccines- Shingrix  Completed   HPV VACCINES  Aged Out    Health Maintenance  There are no preventive care reminders to display for this patient.  Mammagram- Plans to schedule.  Lung Cancer Screening: (Low Dose CT Chest recommended if Age 55-80 years, 30 pack-year currently smoking OR have quit w/in 15years.) does  not qualify.   Hepatitis C Screening: does not qualify  Vision Screening: Recommended annual ophthalmology exams for early detection of glaucoma and other disorders of the eye. Is the patient up to date with their annual eye exam?  Yes   Dental Screening: Recommended annual dental exams for proper oral hygiene. Wears partials.  Community Resource Referral / Chronic Care Management: CRR required this visit?  No   CCM required this visit?  No      Plan:     I have personally reviewed and noted the following in the patient's chart:   Medical and social history Use of alcohol, tobacco or illicit drugs  Current medications and supplements including opioid prescriptions. Currently taking opioids. Followed by pain clinic. Functional ability and status Nutritional status Physical activity Advanced directives List of other physicians Hospitalizations, surgeries, and ER visits in previous 12 months Vitals Screenings to include cognitive, depression, and falls Referrals and appointments  In addition, I have reviewed and discussed with patient certain preventive protocols, quality metrics, and best practice recommendations. A written personalized care plan for preventive services as well as general preventive health recommendations were provided to patient.  Criselda Peaches, LPN   [id number]

## 2020-12-21 ENCOUNTER — Ambulatory Visit: Payer: Medicare HMO | Admitting: Dermatology

## 2020-12-21 ENCOUNTER — Other Ambulatory Visit: Payer: Self-pay

## 2020-12-21 DIAGNOSIS — L821 Other seborrheic keratosis: Secondary | ICD-10-CM

## 2020-12-21 DIAGNOSIS — L820 Inflamed seborrheic keratosis: Secondary | ICD-10-CM | POA: Diagnosis not present

## 2020-12-21 DIAGNOSIS — L578 Other skin changes due to chronic exposure to nonionizing radiation: Secondary | ICD-10-CM | POA: Diagnosis not present

## 2020-12-21 NOTE — Patient Instructions (Signed)

## 2020-12-21 NOTE — Progress Notes (Signed)
   Follow-Up Visit   Subjective  [name] is a 87 y.o. female who presents for the following: Other (Spots of left scalp x ~1 week and right neck x ~1 day). She has other areas to be evaluated today. Accompanied by daughter who contributes to history.  The following portions of the chart were reviewed this encounter and updated as appropriate:   Tobacco  Allergies  Meds  Problems  Med Hx  Surg Hx  Fam Hx     Review of Systems:  No other skin or systemic complaints except as noted in HPI or Assessment and Plan.  Objective  Well appearing patient in no apparent distress; mood and affect are within normal limits.  A focused examination was performed including scalp, neck. Relevant physical exam findings are noted in the Assessment and Plan.  Right neck x 1, left scalp x 1 (2) Erythematous keratotic or waxy stuck-on papule or plaque.    Assessment & Plan   Actinic Damage - chronic, secondary to cumulative UV radiation exposure/sun exposure over time - diffuse scaly erythematous macules with underlying dyspigmentation - Recommend daily broad spectrum sunscreen SPF 30+ to sun-exposed areas, reapply every 2 hours as needed.  - Recommend staying in the shade or wearing long sleeves, sun glasses (UVA+UVB protection) and wide brim hats (4-inch brim around the entire circumference of the hat). - Call for new or changing lesions.  Seborrheic Keratoses - Stuck-on, waxy, tan-brown papules and/or plaques  - Benign-appearing - Discussed benign etiology and prognosis. - Observe - Call for any changes  Inflamed seborrheic keratosis Right neck x 1, left scalp x 1  Destruction of lesion - Right neck x 1, left scalp x 1 Complexity: simple   Destruction method: cryotherapy   Informed consent: discussed and consent obtained   Timeout:  patient name, date of birth, surgical site, and procedure verified Lesion destroyed using liquid nitrogen: Yes   Region frozen until ice ball  extended beyond lesion: Yes   Outcome: patient tolerated procedure well with no complications   Post-procedure details: wound care instructions given    Return for Follow up as scheduled.  I, Ashok Cordia, CMA, am acting as scribe for Sarina Ser, MD . Documentation: I have reviewed the above documentation for accuracy and completeness, and I agree with the above.  Sarina Ser, MD

## 2020-12-22 ENCOUNTER — Encounter: Payer: Self-pay | Admitting: Dermatology

## 2020-12-28 DIAGNOSIS — M48062 Spinal stenosis, lumbar region with neurogenic claudication: Secondary | ICD-10-CM | POA: Diagnosis not present

## 2020-12-28 DIAGNOSIS — M5416 Radiculopathy, lumbar region: Secondary | ICD-10-CM | POA: Diagnosis not present

## 2020-12-28 DIAGNOSIS — M5136 Other intervertebral disc degeneration, lumbar region: Secondary | ICD-10-CM | POA: Diagnosis not present

## 2020-12-28 DIAGNOSIS — Z79899 Other long term (current) drug therapy: Secondary | ICD-10-CM | POA: Diagnosis not present

## 2020-12-28 DIAGNOSIS — M7062 Trochanteric bursitis, left hip: Secondary | ICD-10-CM | POA: Diagnosis not present

## 2020-12-28 DIAGNOSIS — M62830 Muscle spasm of back: Secondary | ICD-10-CM | POA: Diagnosis not present

## 2021-02-01 DIAGNOSIS — K3184 Gastroparesis: Secondary | ICD-10-CM | POA: Diagnosis not present

## 2021-02-01 DIAGNOSIS — Z8719 Personal history of other diseases of the digestive system: Secondary | ICD-10-CM | POA: Diagnosis not present

## 2021-02-01 DIAGNOSIS — K219 Gastro-esophageal reflux disease without esophagitis: Secondary | ICD-10-CM | POA: Diagnosis not present

## 2021-02-01 DIAGNOSIS — Z85038 Personal history of other malignant neoplasm of large intestine: Secondary | ICD-10-CM | POA: Diagnosis not present

## 2021-02-01 DIAGNOSIS — K5909 Other constipation: Secondary | ICD-10-CM | POA: Diagnosis not present

## 2021-02-10 DIAGNOSIS — E7800 Pure hypercholesterolemia, unspecified: Secondary | ICD-10-CM | POA: Diagnosis not present

## 2021-02-10 DIAGNOSIS — I5032 Chronic diastolic (congestive) heart failure: Secondary | ICD-10-CM | POA: Diagnosis not present

## 2021-02-10 DIAGNOSIS — R609 Edema, unspecified: Secondary | ICD-10-CM | POA: Diagnosis not present

## 2021-02-22 ENCOUNTER — Other Ambulatory Visit: Payer: Self-pay | Admitting: Internal Medicine

## 2021-03-03 ENCOUNTER — Ambulatory Visit: Payer: Medicare HMO | Admitting: Dermatology

## 2021-03-03 DIAGNOSIS — M48062 Spinal stenosis, lumbar region with neurogenic claudication: Secondary | ICD-10-CM | POA: Diagnosis not present

## 2021-03-03 DIAGNOSIS — M5416 Radiculopathy, lumbar region: Secondary | ICD-10-CM | POA: Diagnosis not present

## 2021-03-04 ENCOUNTER — Ambulatory Visit: Payer: Medicare HMO | Admitting: Dermatology

## 2021-03-04 ENCOUNTER — Other Ambulatory Visit: Payer: Self-pay

## 2021-03-04 DIAGNOSIS — L820 Inflamed seborrheic keratosis: Secondary | ICD-10-CM | POA: Diagnosis not present

## 2021-03-04 DIAGNOSIS — L304 Erythema intertrigo: Secondary | ICD-10-CM | POA: Diagnosis not present

## 2021-03-04 MED ORDER — MOMETASONE FUROATE 0.1 % EX CREA: 1.0000 "application " | TOPICAL_CREAM | CUTANEOUS | 3 refills | Status: DC

## 2021-03-04 MED ORDER — KETOCONAZOLE 2 % EX CREA: 1.0000 "application " | TOPICAL_CREAM | CUTANEOUS | 3 refills | Status: AC

## 2021-03-04 NOTE — Progress Notes (Signed)
° °  Follow-Up Visit   Subjective  [name] is a 88 y.o. female who presents for the following: Follow-up (Patient reports a rash under breast and under lower abdominal fold for several weeks. Patient also reports several spots at chest she would like checked.).  The following portions of the chart were reviewed this encounter and updated as appropriate:  Tobacco   Allergies   Meds   Problems   Med Hx   Surg Hx   Fam Hx      Review of Systems: No other skin or systemic complaints except as noted in HPI or Assessment and Plan.  Objective  Well appearing patient in no apparent distress; mood and affect are within normal limits.  A focused examination was performed including bilateral inframammary folds and lower abdominal fold. Relevant physical exam findings are noted in the Assessment and Plan.  Bilateral Inframammary Folds and lower abdominal fold Erythematous patches at bilateral inframammary folds and lower abdominal folds  bilateral inframammary folds Erythematous stuck-on, waxy papule or plaque   Assessment & Plan  Erythema intertrigo Bilateral Inframammary Folds and lower abdominal fold  Intertrigo is a chronic recurrent rash that occurs in skin fold areas that may be associated with friction; heat; moisture; yeast; fungus; and bacteria.  It is exacerbated by increased movement / activity; sweating; and higher atmospheric temperature.  Start Ketoconazole 2 % cream - apply at bedtime topically to affected areas under breast and lower abdominal fold nightly on Mondays, Wednesdays, and Fridays weekly  Start mometasone 0.1 % cream - apply at bedtime topically to affected areas under breast and lower abdominal fold on Tuesdays, Thursdays, and Saturdays weekly.   Topical steroids (such as triamcinolone, fluocinolone, fluocinonide, mometasone, clobetasol, halobetasol, betamethasone, hydrocortisone) can cause thinning and lightening of the skin if they are used for too long in the  same area. Your physician has selected the right strength medicine for your problem and area affected on the body. Please use your medication only as directed by your physician to prevent side effects.   ketoconazole (NIZORAL) 2 % cream - Bilateral Inframammary Folds and lower abdominal fold Apply 1 application topically See admin instructions. Apply  to affected areas under breast and lower abdominal fold nightly on Mondays, Wednesdays, and Fridays weekly mometasone (ELOCON) 0.1 % cream - Bilateral Inframammary Folds and lower abdominal fold Apply 1 application topically See admin instructions. Apply at bedtime to affected areas under breast and lower abdominal folder on tuesdays, thursdays, and Saturdays weekly.  Inflamed seborrheic keratosis bilateral inframammary folds Irritated and bothering patient.  This is likely exacerbating her intertrigo.  Patient has multiple areas at bilateral inframammary folds.  Will wait until rash at inframammary has resolved before treating.  Discussed treating at patient's follow up appointment when the intertrigo is improving  Return for 6 week follow up on intertrigo and isks at inframammary . IRuthell Rummage, CMA, am acting as scribe for Sarina Ser, MD. Documentation: I have reviewed the above documentation for accuracy and completeness, and I agree with the above.  Sarina Ser, MD

## 2021-03-04 NOTE — Patient Instructions (Addendum)
Seborrheic Keratosis  What causes seborrheic keratoses? Seborrheic keratoses are harmless, common skin growths that first appear during adult life.  As time goes by, more growths appear.  Some people may develop a large number of them.  Seborrheic keratoses appear on both covered and uncovered body parts.  They are not caused by sunlight.  The tendency to develop seborrheic keratoses can be inherited.  They vary in color from skin-colored to gray, brown, or even black.  They can be either smooth or have a rough, warty surface.   Seborrheic keratoses are superficial and look as if they were stuck on the skin.  Under the microscope this type of keratosis looks like layers upon layers of skin.  That is why at times the top layer may seem to fall off, but the rest of the growth remains and re-grows.    Treatment Seborrheic keratoses do not need to be treated, but can easily be removed in the office.  Seborrheic keratoses often cause symptoms when they rub on clothing or jewelry.  Lesions can be in the way of shaving.  If they become inflamed, they can cause itching, soreness, or burning.  Removal of a seborrheic keratosis can be accomplished by freezing, burning, or surgery. If any spot bleeds, scabs, or grows rapidly, please return to have it checked, as these can be an indication of a skin cancer.    If You Need Anything After Your Visit  If you have any questions or concerns for your doctor, please call our main line at [phone] and press option 4 to reach your doctor's medical assistant. If no one answers, please leave a voicemail as directed and we will return your call as soon as possible. Messages left after 4 pm will be answered the following business day.   You may also send [place] a message via [place]. We typically respond to MyChart messages within 1-2 business days.  For prescription refills, please ask your pharmacy to contact our office. Our fax number is [phone].  If you have an  urgent issue when the clinic is closed that cannot wait until the next business day, you can page your doctor at the number below.    Please note that while we do our best to be available for urgent issues outside of office hours, we are not available 24/7.   If you have an urgent issue and are unable to reach [place], you may choose to seek medical care at your doctor's office, retail clinic, urgent care center, or emergency room.  If you have a medical emergency, please immediately call 911 or go to the emergency department.  Pager Numbers  - Dr. Nehemiah Massed: [phone]  - Dr. Laurence Ferrari: [phone]  - Dr. Nicole Kindred: [phone]  In the event of inclement weather, please call our main line at [phone] for an update on the status of any delays or closures.  Dermatology Medication Tips: Please keep the boxes that topical medications come in in order to help keep track of the instructions about where and how to use these. Pharmacies typically print the medication instructions only on the boxes and not directly on the medication tubes.   If your medication is too expensive, please contact our office at [phone] option 4 or send [place] a message through [place].   We are unable to tell what your co-pay for medications will be in advance as this is different depending on your insurance coverage. However, we may be able to find a substitute medication at lower  cost or fill out paperwork to get insurance to cover a needed medication.   If a prior authorization is required to get your medication covered by your insurance company, please allow [place] 1-2 business days to complete this process.  Drug prices often vary depending on where the prescription is filled and some pharmacies may offer cheaper prices.  The website www.goodrx.com contains coupons for medications through different pharmacies. The prices here do not account for what the cost may be with help from insurance (it may be cheaper with your  insurance), but the website can give you the price if you did not use any insurance.  - You can print the associated coupon and take it with your prescription to the pharmacy.  - You may also stop by our office during regular business hours and pick up a GoodRx coupon card.  - If you need your prescription sent electronically to a different pharmacy, notify our office through [facility] or by phone at [phone] option 4.     Si Usted Necesita Algo Despus de Su Visita  Tambin puede enviarnos un mensaje a travs de Pharmacist, community. Por lo general respondemos a los mensajes de MyChart en el transcurso de 1 a 2 das hbiles.  Para renovar recetas, por favor pida a su farmacia que se ponga en contacto con nuestra oficina. Harland Dingwall de fax es [place] [phone].  Si tiene un asunto urgente cuando la clnica est cerrada y que no puede esperar hasta el siguiente da hbil, puede llamar/localizar a su doctor(a) al nmero que aparece a continuacin.   Por favor, tenga en cuenta que aunque hacemos todo lo posible para estar disponibles para asuntos urgentes fuera del horario de [place], no estamos disponibles las 24 horas del da, los 7 das de la [place].   Si tiene un problema urgente y no puede comunicarse con nosotros, puede optar por buscar atencin mdica  en el consultorio de su doctor(a), en una clnica privada, en un centro de atencin urgente o en una sala de emergencias.  Si tiene Engineering geologist, por favor llame inmediatamente al 911 o vaya a la sala de emergencias.  Nmeros de bper  - Dr. Nehemiah Massed: [phone]  - Dra. Moye: [phone]  - Dra. Nicole Kindred: [phone]  En caso de inclemencias del [place], por favor llame a Johnsie Kindred principal al [phone] para una actualizacin sobre el [place] de cualquier retraso o cierre.  Consejos para la medicacin en dermatologa: Por favor, guarde las cajas en las que vienen los medicamentos de uso tpico para ayudarle a  seguir las instrucciones sobre dnde y cmo usarlos. Las farmacias generalmente imprimen las instrucciones del medicamento slo en las cajas y no directamente en los tubos del [place].   Si su medicamento es muy caro, por favor, pngase en contacto con Zigmund Daniel llamando al [phone] y presione la opcin 4 o envenos un mensaje a travs de Pharmacist, community.   No podemos decirle cul ser su copago por los medicamentos por adelantado ya que esto es diferente dependiendo de la cobertura de su seguro. Sin embargo, es posible que podamos encontrar un medicamento sustituto a Electrical engineer un formulario para que el seguro cubra el medicamento que se considera necesario.   Si se requiere una autorizacin previa para que su compaa de seguros [place] su medicamento, por favor permtanos de 1 a 2 das hbiles para completar este proceso.  Los precios de los medicamentos varan con frecuencia dependiendo del Environmental consultant de dnde se surte  la receta y alguna farmacias pueden ofrecer precios ms baratos.  El sitio web www.goodrx.com tiene cupones para medicamentos de Airline pilot. Los precios aqu no tienen en cuenta lo que podra costar con la ayuda del seguro (puede ser ms barato con su seguro), pero el sitio web puede darle el precio si no utiliz Research scientist (physical sciences).  - Puede imprimir el cupn correspondiente y llevarlo con su receta a la farmacia.  - Tambin puede pasar por nuestra oficina durante el horario de atencin regular y Charity fundraiser una tarjeta de cupones de GoodRx.  - Si necesita que su receta se enve electrnicamente a una farmacia diferente, informe a nuestra oficina a travs de MyChart de [place] o por telfono llamando al [phone] y presione la opcin 4.

## 2021-03-09 ENCOUNTER — Encounter: Payer: Self-pay | Admitting: Dermatology

## 2021-03-23 ENCOUNTER — Other Ambulatory Visit: Payer: Self-pay | Admitting: Internal Medicine

## 2021-04-05 DIAGNOSIS — M7062 Trochanteric bursitis, left hip: Secondary | ICD-10-CM | POA: Diagnosis not present

## 2021-04-05 DIAGNOSIS — M62830 Muscle spasm of back: Secondary | ICD-10-CM | POA: Diagnosis not present

## 2021-04-05 DIAGNOSIS — Z79899 Other long term (current) drug therapy: Secondary | ICD-10-CM | POA: Diagnosis not present

## 2021-04-05 DIAGNOSIS — M5136 Other intervertebral disc degeneration, lumbar region: Secondary | ICD-10-CM | POA: Diagnosis not present

## 2021-04-05 DIAGNOSIS — M48062 Spinal stenosis, lumbar region with neurogenic claudication: Secondary | ICD-10-CM | POA: Diagnosis not present

## 2021-04-05 DIAGNOSIS — M5416 Radiculopathy, lumbar region: Secondary | ICD-10-CM | POA: Diagnosis not present

## 2021-04-21 ENCOUNTER — Other Ambulatory Visit: Payer: Self-pay

## 2021-04-21 ENCOUNTER — Ambulatory Visit: Payer: Medicare HMO | Admitting: Dermatology

## 2021-04-21 DIAGNOSIS — L304 Erythema intertrigo: Secondary | ICD-10-CM

## 2021-04-21 DIAGNOSIS — L820 Inflamed seborrheic keratosis: Secondary | ICD-10-CM

## 2021-04-21 DIAGNOSIS — L578 Other skin changes due to chronic exposure to nonionizing radiation: Secondary | ICD-10-CM

## 2021-04-21 NOTE — Patient Instructions (Addendum)

## 2021-04-21 NOTE — Progress Notes (Unsigned)
° °  Follow-Up Visit   Subjective  [name] is a 88 y.o. female who presents for the following: Intertrigo (Inframammary, lower abd fold, Ketoconazole 2% cr 3d/wk, Mometasone cr 3d/wk, improved) and ISKs (Bil inframammary folds, to txt today).  Patient accompanied by daughter  The following portions of the chart were reviewed this encounter and updated as appropriate:       Review of Systems:  No other skin or systemic complaints except as noted in HPI or Assessment and Plan.  Objective  Well appearing patient in no apparent distress; mood and affect are within normal limits.  A focused examination was performed including inframammary, lower abdomen. Relevant physical exam findings are noted in the Assessment and Plan.  bil inframammary folds x 18 (18) Stuck on waxy paps with erythema  Bil Inframammary Fold, Lower abdomen fold Mild erythema inframammary    Assessment & Plan  Inflamed seborrheic keratosis (18) bil inframammary folds x 18  Destruction of lesion - bil inframammary folds x 18 Complexity: simple   Destruction method: cryotherapy   Informed consent: discussed and consent obtained   Timeout:  patient name, date of birth, surgical site, and procedure verified Lesion destroyed using liquid nitrogen: Yes   Region frozen until ice ball extended beyond lesion: Yes   Outcome: patient tolerated procedure well with no complications   Post-procedure details: wound care instructions given    Intertrigo Bil Inframammary Fold, Lower abdomen fold  Intertrigo is a chronic recurrent rash that occurs in skin fold areas that may be associated with friction; heat; moisture; yeast; fungus; and bacteria.  It is exacerbated by increased movement / activity; sweating; and higher atmospheric temperature.  Cont Ketoconazole 2% cr 3d/wk Monday, Wednesday, Friday prn flares Cont Mometasone cr 3d/wk, Tuesday, Thursday, Saturday prn flares  Topical steroids (such as triamcinolone,  fluocinolone, fluocinonide, mometasone, clobetasol, halobetasol, betamethasone, hydrocortisone) can cause thinning and lightening of the skin if they are used for too long in the same area. Your physician has selected the right strength medicine for your problem and area affected on the body. Please use your medication only as directed by your physician to prevent side effects.     Return if symptoms worsen or fail to improve.  I, Othelia Pulling, RMA, am acting as scribe for Sarina Ser, MD .

## 2021-04-26 ENCOUNTER — Other Ambulatory Visit: Payer: Self-pay | Admitting: Internal Medicine

## 2021-04-27 ENCOUNTER — Encounter: Payer: Self-pay | Admitting: Dermatology

## 2021-05-13 DIAGNOSIS — R0609 Other forms of dyspnea: Secondary | ICD-10-CM | POA: Diagnosis not present

## 2021-06-08 DIAGNOSIS — I10 Essential (primary) hypertension: Secondary | ICD-10-CM | POA: Diagnosis not present

## 2021-06-08 DIAGNOSIS — I5032 Chronic diastolic (congestive) heart failure: Secondary | ICD-10-CM | POA: Diagnosis not present

## 2021-06-08 DIAGNOSIS — I739 Peripheral vascular disease, unspecified: Secondary | ICD-10-CM | POA: Diagnosis not present

## 2021-06-08 DIAGNOSIS — R600 Localized edema: Secondary | ICD-10-CM | POA: Diagnosis not present

## 2021-06-08 DIAGNOSIS — Z79899 Other long term (current) drug therapy: Secondary | ICD-10-CM | POA: Diagnosis not present

## 2021-06-21 ENCOUNTER — Ambulatory Visit: Payer: Medicare HMO | Admitting: Internal Medicine

## 2021-06-23 ENCOUNTER — Encounter: Payer: Medicare HMO | Admitting: Dermatology

## 2021-06-24 ENCOUNTER — Encounter: Payer: Medicare HMO | Admitting: Dermatology

## 2021-07-05 ENCOUNTER — Other Ambulatory Visit: Payer: Self-pay | Admitting: Internal Medicine

## 2021-07-05 DIAGNOSIS — M7912 Myalgia of auxiliary muscles, head and neck: Secondary | ICD-10-CM | POA: Diagnosis not present

## 2021-07-05 DIAGNOSIS — M5136 Other intervertebral disc degeneration, lumbar region: Secondary | ICD-10-CM | POA: Diagnosis not present

## 2021-07-05 DIAGNOSIS — M5416 Radiculopathy, lumbar region: Secondary | ICD-10-CM | POA: Diagnosis not present

## 2021-07-05 DIAGNOSIS — M48062 Spinal stenosis, lumbar region with neurogenic claudication: Secondary | ICD-10-CM | POA: Diagnosis not present

## 2021-08-28 ENCOUNTER — Other Ambulatory Visit: Payer: Self-pay | Admitting: Internal Medicine

## 2021-08-30 DIAGNOSIS — I10 Essential (primary) hypertension: Secondary | ICD-10-CM | POA: Diagnosis not present

## 2021-08-30 DIAGNOSIS — R0609 Other forms of dyspnea: Secondary | ICD-10-CM | POA: Diagnosis not present

## 2021-08-30 DIAGNOSIS — I5032 Chronic diastolic (congestive) heart failure: Secondary | ICD-10-CM | POA: Diagnosis not present

## 2021-08-30 DIAGNOSIS — R609 Edema, unspecified: Secondary | ICD-10-CM | POA: Diagnosis not present

## 2021-09-21 ENCOUNTER — Ambulatory Visit (INDEPENDENT_AMBULATORY_CARE_PROVIDER_SITE_OTHER): Payer: Medicare HMO | Admitting: Internal Medicine

## 2021-09-21 ENCOUNTER — Encounter: Payer: Self-pay | Admitting: Internal Medicine

## 2021-09-21 VITALS — BP 120/68 | HR 62 | Temp 98.1°F | Resp 16 | Ht 60.0 in | Wt 153.0 lb

## 2021-09-21 DIAGNOSIS — I700 Atherosclerosis of aorta: Secondary | ICD-10-CM | POA: Diagnosis not present

## 2021-09-21 DIAGNOSIS — R739 Hyperglycemia, unspecified: Secondary | ICD-10-CM | POA: Diagnosis not present

## 2021-09-21 DIAGNOSIS — E7800 Pure hypercholesterolemia, unspecified: Secondary | ICD-10-CM | POA: Diagnosis not present

## 2021-09-21 DIAGNOSIS — R600 Localized edema: Secondary | ICD-10-CM

## 2021-09-21 DIAGNOSIS — Z0000 Encounter for general adult medical examination without abnormal findings: Secondary | ICD-10-CM

## 2021-09-21 DIAGNOSIS — K22719 Barrett's esophagus with dysplasia, unspecified: Secondary | ICD-10-CM | POA: Diagnosis not present

## 2021-09-21 DIAGNOSIS — M545 Low back pain, unspecified: Secondary | ICD-10-CM

## 2021-09-21 DIAGNOSIS — I10 Essential (primary) hypertension: Secondary | ICD-10-CM

## 2021-09-21 DIAGNOSIS — Z85038 Personal history of other malignant neoplasm of large intestine: Secondary | ICD-10-CM

## 2021-09-21 DIAGNOSIS — D509 Iron deficiency anemia, unspecified: Secondary | ICD-10-CM | POA: Diagnosis not present

## 2021-09-21 DIAGNOSIS — I739 Peripheral vascular disease, unspecified: Secondary | ICD-10-CM

## 2021-09-21 DIAGNOSIS — G8929 Other chronic pain: Secondary | ICD-10-CM

## 2021-09-21 LAB — HEPATIC FUNCTION PANEL
ALT: 12 U/L (ref 0–35)
AST: 18 U/L (ref 0–37)
Albumin: 4.5 g/dL (ref 3.5–5.2)
Alkaline Phosphatase: 55 U/L (ref 39–117)
Bilirubin, Direct: 0.3 mg/dL (ref 0.0–0.3)
Total Bilirubin: 1.7 mg/dL — ABNORMAL HIGH (ref 0.2–1.2)
Total Protein: 7.4 g/dL (ref 6.0–8.3)

## 2021-09-21 LAB — BASIC METABOLIC PANEL
BUN: 23 mg/dL (ref 6–23)
CO2: 26 mEq/L (ref 19–32)
Calcium: 10 mg/dL (ref 8.4–10.5)
Chloride: 102 mEq/L (ref 96–112)
Creatinine, Ser: 1.01 mg/dL (ref 0.40–1.20)
GFR: 48.97 mL/min — ABNORMAL LOW (ref >60.00)
Glucose, Bld: 114 mg/dL — ABNORMAL HIGH (ref 70–99)
Potassium: 3.9 mEq/L (ref 3.5–5.1)
Sodium: 139 mEq/L (ref 135–145)

## 2021-09-21 LAB — LIPID PANEL
Cholesterol: 156 mg/dL (ref 0–200)
HDL: 60.2 mg/dL (ref >39.00)
LDL Cholesterol: 64 mg/dL (ref 0–99)
NonHDL: 96.11
Total CHOL/HDL Ratio: 3
Triglycerides: 161 mg/dL — ABNORMAL HIGH (ref 0.0–149.0)
VLDL: 32.2 mg/dL (ref 0.0–40.0)

## 2021-09-21 LAB — CBC WITH DIFFERENTIAL/PLATELET
Basophils Absolute: 0.1 10*3/uL (ref 0.0–0.1)
Basophils Relative: 0.7 % (ref 0.0–3.0)
Eosinophils Absolute: 0.2 10*3/uL (ref 0.0–0.7)
Eosinophils Relative: 2.4 % (ref 0.0–5.0)
HCT: 39.8 % (ref 36.0–46.0)
Hemoglobin: 13.7 g/dL (ref 12.0–15.0)
Lymphocytes Relative: 17.2 % (ref 12.0–46.0)
Lymphs Abs: 1.4 10*3/uL (ref 0.7–4.0)
MCHC: 34.5 g/dL (ref 30.0–36.0)
MCV: 95.1 fl (ref 78.0–100.0)
Monocytes Absolute: 0.6 10*3/uL (ref 0.1–1.0)
Monocytes Relative: 7.5 % (ref 3.0–12.0)
Neutro Abs: 5.9 10*3/uL (ref 1.4–7.7)
Neutrophils Relative %: 72.2 % (ref 43.0–77.0)
Platelets: 301 10*3/uL (ref 150.0–400.0)
RBC: 4.18 Mil/uL (ref 3.87–5.11)
RDW: 13.7 % (ref 11.5–15.5)
WBC: 8.2 10*3/uL (ref 4.0–10.5)

## 2021-09-21 LAB — IBC + FERRITIN
Ferritin: 165.1 ng/mL (ref 10.0–291.0)
Iron: 159 ug/dL — ABNORMAL HIGH (ref 42–145)
Saturation Ratios: 45.6 % (ref 20.0–50.0)
TIBC: 348.6 ug/dL (ref 250.0–450.0)
Transferrin: 249 mg/dL (ref 212.0–360.0)

## 2021-09-21 LAB — TSH: TSH: 2.37 u[IU]/mL (ref 0.35–5.50)

## 2021-09-21 LAB — HEMOGLOBIN A1C: Hgb A1c MFr Bld: 5.9 % (ref 4.6–6.5)

## 2021-09-21 NOTE — Progress Notes (Unsigned)
Patient ID: [name], female   DOB: 12/20/1928, 88 y.o.   MRN: [id number]   Subjective:    Patient ID: [name], female    DOB: [birth year], 88 y.o.   MRN: [id number]   Patient here for her physical exam.   Chief Complaint  Patient presents with   Follow-up    Yearly CPE   .   HPI Here for her physical exam.  She is accompanied by her daughter.  History obtained from both of them. Reports she is doing relatively well.  Saw cardiology for f/u 08/30/21.  Recommended continuing lasix '20mg'$  q day.  Stable.  No chest pain.  Breathing stable.  Sees Dr Sharlet Salina.  S/p trigger point injections.  They prescribe mobic, tramadol, oxycodone and gabapentin.  She takes tramadol q hs.  Back pain is controlled.  No increased cough or congestion.  No abdominal pain.  Bowels moving. Overall she feels she is doing relatively well.     Past Medical History:  Diagnosis Date   Anemia    Barrett's esophagus    BCC (basal cell carcinoma of skin) 03/07/2013   vertex scalp - NODULAR NODULAR PATTERN PATTERN   Bowel obstruction (HCC)    s/p adhesion resection   Chronic back pain    Colon cancer ([place]) 1988 and 1989   adenocarcinoma, s/p resection x  2 and chemo   Diverticulitis    GERD (gastroesophageal reflux disease)    H/O open leg wound    Hiatal hernia    Hypercholesteremia    Hypertension    Lumbar scoliosis    OA (osteoarthritis)    Peripheral neuropathy    Recurrent sinus infections    Renal cyst    S/P chemotherapy, time since greater than 12 weeks    colon cancer   Vertigo    Past Surgical History:  Procedure Laterality Date   ABDOMINAL HYSTERECTOMY  1982   adhesions resected     bowel obstruction   APPENDECTOMY     BREAST BIOPSY Left    negative 06/14/1985   CHOLECYSTECTOMY  1994   COLON RESECTION     x2.  s/p colon cancer   COLON SURGERY  1958 and 1989   For Colon Cancer   EXCISIONAL HEMORRHOIDECTOMY     with tubal ligation   EXPLORATORY LAPAROTOMY  1991   Secondary to  SBO   Family History  Problem Relation Age of Onset   Breast cancer Mother    Asthma Mother    Stroke Father    Hypertension Father    Diabetes Father    Ovarian cancer Sister        x2   Prostate cancer Brother    Hypertension Brother        x3   Heart disease Brother    Hypercholesterolemia Brother        x3   Diabetes Brother    Spina bifida Grandchild    Hematuria Neg Hx    Kidney cancer Neg Hx    Kidney disease Neg Hx    Sickle cell trait Neg Hx    Tuberculosis Neg Hx    Social History   Socioeconomic History   Marital status: Married    Spouse name: Not on file   Number of children: 4   Years of education: 12th grade   Highest education level: Not on file  Occupational History   Occupation: homemaker  Tobacco Use   Smoking status: Never   Smokeless tobacco: Never  Vaping Use   Vaping Use: Never used  Substance and Sexual Activity   Alcohol use: No    Alcohol/week: 0.0 standard drinks of alcohol   Drug use: No   Sexual activity: Never  Other Topics Concern   Not on file  Social History Narrative   Not on file   Social Determinants of Health   Financial Resource Strain: Low Risk  (11/30/2020)   Overall Financial Resource Strain (CARDIA)    Difficulty of Paying Living Expenses: Not hard at all  Food Insecurity: No Food Insecurity (11/26/2019)   Hunger Vital Sign    Worried About Running Out of Food in the Last Year: Never true    Ran Out of Food in the Last Year: Never true  Transportation Needs: No Transportation Needs (11/26/2019)   PRAPARE - Hydrologist (Medical): No    Lack of Transportation (Non-Medical): No  Physical Activity: Unknown (11/30/2020)   Exercise Vital Sign    Days of Exercise per Week: 0 days    Minutes of Exercise per Session: Not on file  Stress: No Stress Concern Present (11/30/2020)   [place]    Feeling of Stress : Not at all   Social Connections: Unknown (11/30/2020)   Social Connection and Isolation Panel [NHANES]    Frequency of Communication with Friends and Family: More than three times a week    Frequency of Social Gatherings with Friends and Family: More than three times a week    Attends Religious Services: Not on Advertising copywriter or Organizations: Not on file    Attends Archivist Meetings: Not on file    Marital Status: Married     Review of Systems  Constitutional:  Negative for appetite change and unexpected weight change.  HENT:  Negative for congestion, sinus pressure and sore throat.   Eyes:  Negative for pain and visual disturbance.  Respiratory:  Negative for cough, chest tightness and shortness of breath.   Cardiovascular:  Negative for chest pain and palpitations.       No increased leg swelling.   Gastrointestinal:  Negative for abdominal pain, diarrhea, nausea and vomiting.  Genitourinary:  Negative for difficulty urinating and dysuria.  Musculoskeletal:  Positive for back pain. Negative for joint swelling and myalgias.  Skin:  Negative for color change and rash.  Neurological:  Negative for dizziness, light-headedness and headaches.  Hematological:  Negative for adenopathy. Does not bruise/bleed easily.  Psychiatric/Behavioral:  Negative for agitation and dysphoric mood.        Objective:     BP 120/68 (BP Location: Left Arm, Patient Position: Sitting, Cuff Size: Small)   Pulse 62   Temp 98.1 F (36.7 C) (Temporal)   Resp 16   Ht 5' (1.524 m)   Wt 153 lb (69.4 kg)   SpO2 95%   BMI 29.88 kg/m  Wt Readings from Last 3 Encounters:  09/21/21 153 lb (69.4 kg)  11/30/20 156 lb (70.8 kg)  11/10/20 156 lb (70.8 kg)    Physical Exam Vitals reviewed.  Constitutional:      General: She is not in acute distress.    Appearance: Normal appearance. She is well-developed.  HENT:     Head: Normocephalic and atraumatic.     Right Ear: External ear normal.      Left Ear: External ear normal.  Eyes:     General: No scleral icterus.  Right eye: No discharge.        Left eye: No discharge.     Conjunctiva/sclera: Conjunctivae normal.  Neck:     Thyroid: No thyromegaly.  Cardiovascular:     Rate and Rhythm: Normal rate and regular rhythm.  Pulmonary:     Effort: No tachypnea, accessory muscle usage or respiratory distress.     Breath sounds: Normal breath sounds. No decreased breath sounds or wheezing.  Chest:  Breasts:    Right: No inverted nipple, mass, nipple discharge or tenderness (no axillary adenopathy).     Left: No inverted nipple, mass, nipple discharge or tenderness (no axilarry adenopathy).  Abdominal:     General: Bowel sounds are normal.     Palpations: Abdomen is soft.     Tenderness: There is no abdominal tenderness.  Musculoskeletal:        General: No swelling or tenderness.     Cervical back: Neck supple.  Lymphadenopathy:     Cervical: No cervical adenopathy.  Skin:    Findings: No erythema or rash.  Neurological:     Mental Status: She is alert and oriented to person, place, and time.  Psychiatric:        Mood and Affect: Mood normal.        Behavior: Behavior normal.      Outpatient Encounter Medications as of 09/21/2021  Medication Sig   albuterol (VENTOLIN HFA) 108 (90 Base) MCG/ACT inhaler TAKE 2 PUFFS BY MOUTH EVERY 6 HOURS AS NEEDED FOR WHEEZE OR SHORTNESS OF BREATH   amLODipine (NORVASC) 5 MG tablet TAKE 1 TABLET EVERY DAY   calcium carbonate (OSCAL) 1500 (600 Ca) MG TABS tablet Take 600 mg of elemental calcium by mouth 2 (two) times daily with a meal.   Cholecalciferol (VITAMIN D3) 50 MCG (2000 UT) capsule Take 2,000 Units by mouth daily.   docusate sodium (COLACE) 100 MG capsule Take 100 mg by mouth 2 (two) times daily.   fexofenadine (ALLEGRA) 60 MG tablet TAKE 1 TABLET EVERY DAY   furosemide (LASIX) 20 MG tablet TAKE 1 TABLET EVERY DAY   gabapentin (NEURONTIN) 100 MG capsule TAKE 2 CAPSULES  AT BEDTIME   irbesartan (AVAPRO) 300 MG tablet TAKE 1 TABLET EVERY DAY   lisinopril-hydrochlorothiazide (ZESTORETIC) 10-12.5 MG tablet Take 1 tablet by mouth daily.   losartan (COZAAR) 50 MG tablet Take 50 mg by mouth daily.   meclizine (ANTIVERT) 12.5 MG tablet Take 1 tablet (12.5 mg total) by mouth 2 (two) times daily as needed for dizziness.   metoCLOPramide (REGLAN) 5 MG tablet Take 5 mg by mouth 2 (two) times daily.   metoprolol succinate (TOPROL-XL) 25 MG 24 hr tablet TAKE 1 TABLET TWICE DAILY   mometasone (ELOCON) 0.1 % cream Apply 1 application topically See admin instructions. Apply at bedtime to affected areas under breast and lower abdominal folder on tuesdays, thursdays, and Saturdays weekly.   ondansetron (ZOFRAN ODT) 4 MG disintegrating tablet Take 1 tablet (4 mg total) by mouth 2 (two) times daily as needed for nausea or vomiting.   oxyCODONE (OXY IR/ROXICODONE) 5 MG immediate release tablet Take 5 mg by mouth 3 (three) times daily.    simvastatin (ZOCOR) 10 MG tablet TAKE 1 TABLET (10 MG TOTAL) AT BEDTIME.   Spacer/Aero-Holding Chambers (OPTICHAMBER DIAMOND) MISC    traMADol (ULTRAM) 50 MG tablet Take 1 tablet by mouth at bedtime as needed.   traMADol-acetaminophen (ULTRACET) 37.5-325 MG per tablet Take 1 tablet by mouth 2 (two) times daily as needed.    [  DISCONTINUED] oxybutynin (DITROPAN-XL) 10 MG 24 hr tablet Take 1 tablet (10 mg total) by mouth daily. (Patient not taking: Reported on 11/30/2020)   [DISCONTINUED] pantoprazole (PROTONIX) 40 MG tablet TAKE 1 TABLET TWICE DAILY (Patient not taking: Reported on 11/30/2020)   No facility-administered encounter medications on file as of 09/21/2021.     Lab Results  Component Value Date   WBC 8.2 09/21/2021   HGB 13.7 09/21/2021   HCT 39.8 09/21/2021   PLT 301.0 09/21/2021   GLUCOSE 114 (H) 09/21/2021   CHOL 156 09/21/2021   TRIG 161.0 (H) 09/21/2021   HDL 60.20 09/21/2021   LDLCALC 64 09/21/2021   ALT 12 09/21/2021   AST 18  09/21/2021   NA 139 09/21/2021   K 3.9 09/21/2021   CL 102 09/21/2021   CREATININE 1.01 09/21/2021   BUN 23 09/21/2021   CO2 26 09/21/2021   TSH 2.37 09/21/2021   INR 0.91 06/11/2015   HGBA1C 5.9 09/21/2021       Assessment & Plan:   Problem List Items Addressed This Visit     Anemia, iron deficiency    History of - follow cbc and iron studies.       Aortic atherosclerosis (HCC)    Continue simvastatin.       Barrett's esophagus    Has seen GI.  Per note, off protonix.  Need to clarify.  GI recommended continuing protonix and reglan.       Chronic back pain    Uses her walker to ambulate.  Followed by Dr Sharlet Salina. Stable.  S/p trigger point injections.       Health care maintenance    Physical today 09/21/21.  Mammogram - wants to hold on getting any more mammograms.  Colonoscopy 2012. Saw GI -  Recommended no further colonoscopy.       Hereditary hemochromatosis ([place])    Previously evaluated by hematology.  Has declined further evaluation/work up.  Follow cbc and iron studies.       History of colon cancer    Has been followed by GI.  Bowels have been stable.       Hypercholesteremia    Continue simvastatin.  Follow lipid panel and liver function tests.  Continue low cholesterol diet and exercise as tolerated.       Hypertension    Blood pressure has been under control.  Continue avapro, amlodipine, metoprolol and lasix.  Continue to spot check blood pressure. Follow metabolic panel.       Lower extremity edema    Not using compression hose now.  On lasix '20mg'$  daily.  Swelling improved. Follow.  Check metabolic panel.       PAD (peripheral artery disease) (HCC)    Previously evaluated by [facility].  Continue risk factor modification.  Continue simvastatin.       Other Visit Diagnoses     Routine general medical examination at a health care facility    -  Primary   Hyperglycemia            [name], MD

## 2021-09-22 ENCOUNTER — Encounter: Payer: Self-pay | Admitting: Internal Medicine

## 2021-09-22 NOTE — Assessment & Plan Note (Signed)
Continue simvastatin.  Follow lipid panel and liver function tests.  Continue low cholesterol diet and exercise as tolerated.

## 2021-09-22 NOTE — Assessment & Plan Note (Addendum)
Blood pressure has been under control.  Continue avapro, amlodipine, metoprolol and lasix.  Continue to spot check blood pressure. Follow metabolic panel.

## 2021-09-22 NOTE — Assessment & Plan Note (Addendum)
Previously evaluated by hematology.  Has declined further evaluation/work up.  Follow cbc and iron studies.

## 2021-09-22 NOTE — Assessment & Plan Note (Signed)
Not using compression hose now.  On lasix '20mg'$  daily.  Swelling improved. Follow.  Check metabolic panel.

## 2021-09-22 NOTE — Assessment & Plan Note (Signed)
History of - follow cbc and iron studies.

## 2021-09-22 NOTE — Assessment & Plan Note (Signed)
Previously evaluated by [facility].  Continue risk factor modification.  Continue simvastatin.

## 2021-09-22 NOTE — Assessment & Plan Note (Signed)
Has seen GI.  Per note, off protonix.  Need to clarify.  GI recommended continuing protonix and reglan.

## 2021-09-22 NOTE — Assessment & Plan Note (Signed)
Uses her walker to ambulate.  Followed by Dr Sharlet Salina. Stable.  S/p trigger point injections.

## 2021-09-22 NOTE — Assessment & Plan Note (Signed)
Physical today 09/21/21.  Mammogram - wants to hold on getting any more mammograms.  Colonoscopy 2012. Saw GI -  Recommended no further colonoscopy.

## 2021-09-22 NOTE — Assessment & Plan Note (Signed)
Continue simvastatin. 

## 2021-09-22 NOTE — Assessment & Plan Note (Signed)
Has been followed by GI.  Bowels have been stable.

## 2021-09-23 ENCOUNTER — Other Ambulatory Visit: Payer: Self-pay | Admitting: Internal Medicine

## 2021-09-23 NOTE — Progress Notes (Signed)
Orders placed for f/u labs.  

## 2021-09-24 ENCOUNTER — Emergency Department
Admission: EM | Admit: 2021-09-24 | Discharge: 2021-09-24 | Disposition: A | Discharge status: Home / Self Care | Payer: Medicare HMO | Attending: Emergency Medicine | Admitting: Emergency Medicine

## 2021-09-24 ENCOUNTER — Emergency Department: Payer: Medicare HMO

## 2021-09-24 ENCOUNTER — Other Ambulatory Visit: Payer: Self-pay

## 2021-09-24 ENCOUNTER — Encounter: Payer: Self-pay | Admitting: Emergency Medicine

## 2021-09-24 DIAGNOSIS — Z043 Encounter for examination and observation following other accident: Secondary | ICD-10-CM | POA: Diagnosis not present

## 2021-09-24 DIAGNOSIS — S0990XA Unspecified injury of head, initial encounter: Secondary | ICD-10-CM | POA: Diagnosis not present

## 2021-09-24 DIAGNOSIS — S0101XA Laceration without foreign body of scalp, initial encounter: Secondary | ICD-10-CM | POA: Insufficient documentation

## 2021-09-24 DIAGNOSIS — I10 Essential (primary) hypertension: Secondary | ICD-10-CM | POA: Diagnosis not present

## 2021-09-24 DIAGNOSIS — R58 Hemorrhage, not elsewhere classified: Secondary | ICD-10-CM | POA: Diagnosis not present

## 2021-09-24 DIAGNOSIS — M25572 Pain in left ankle and joints of left foot: Secondary | ICD-10-CM | POA: Diagnosis not present

## 2021-09-24 DIAGNOSIS — G4489 Other headache syndrome: Secondary | ICD-10-CM | POA: Diagnosis not present

## 2021-09-24 DIAGNOSIS — W01198A Fall on same level from slipping, tripping and stumbling with subsequent striking against other object, initial encounter: Secondary | ICD-10-CM | POA: Insufficient documentation

## 2021-09-24 DIAGNOSIS — M4312 Spondylolisthesis, cervical region: Secondary | ICD-10-CM | POA: Diagnosis not present

## 2021-09-24 DIAGNOSIS — M25551 Pain in right hip: Secondary | ICD-10-CM | POA: Diagnosis not present

## 2021-09-24 DIAGNOSIS — M47812 Spondylosis without myelopathy or radiculopathy, cervical region: Secondary | ICD-10-CM | POA: Diagnosis not present

## 2021-09-24 LAB — CBC WITH DIFFERENTIAL/PLATELET
Abs Immature Granulocytes: 0.05 10*3/uL (ref 0.00–0.07)
Basophils Absolute: 0.1 10*3/uL (ref 0.0–0.1)
Basophils Relative: 1 %
Eosinophils Absolute: 0.2 10*3/uL (ref 0.0–0.5)
Eosinophils Relative: 2 %
HCT: 41.3 % (ref 36.0–46.0)
Hemoglobin: 13.5 g/dL (ref 12.0–15.0)
Immature Granulocytes: 1 %
Lymphocytes Relative: 17 %
Lymphs Abs: 1.5 10*3/uL (ref 0.7–4.0)
MCH: 31.6 pg (ref 26.0–34.0)
MCHC: 32.7 g/dL (ref 30.0–36.0)
MCV: 96.7 fL (ref 80.0–100.0)
Monocytes Absolute: 0.6 10*3/uL (ref 0.1–1.0)
Monocytes Relative: 7 %
Neutro Abs: 6.6 10*3/uL (ref 1.7–7.7)
Neutrophils Relative %: 72 %
Platelets: 266 10*3/uL (ref 150–400)
RBC: 4.27 MIL/uL (ref 3.87–5.11)
RDW: 13 % (ref 11.5–15.5)
WBC: 9 10*3/uL (ref 4.0–10.5)
nRBC: 0 % (ref 0.0–0.2)

## 2021-09-24 LAB — BASIC METABOLIC PANEL
Anion gap: 8 (ref 5–15)
BUN: 21 mg/dL (ref 8–23)
CO2: 26 mmol/L (ref 22–32)
Calcium: 9.3 mg/dL (ref 8.9–10.3)
Chloride: 107 mmol/L (ref 98–111)
Creatinine, Ser: 1.1 mg/dL — ABNORMAL HIGH (ref 0.44–1.00)
GFR, Estimated: 48 mL/min — ABNORMAL LOW (ref >60)
Glucose, Bld: 125 mg/dL — ABNORMAL HIGH (ref 70–99)
Potassium: 3.7 mmol/L (ref 3.5–5.1)
Sodium: 141 mmol/L (ref 135–145)

## 2021-09-24 MED ORDER — OXYCODONE-ACETAMINOPHEN 5-325 MG PO TABS
1.0000 | ORAL_TABLET | Freq: Once | ORAL | Status: AC
  Administered 2021-09-24: 1 via ORAL
  Filled 2021-09-24: qty 1

## 2021-09-24 NOTE — ED Notes (Signed)
Two ED techs attempted blood draw without success. Patient's family verified that patient takes Oxycodone at home and hasn't taken it this morning.

## 2021-09-24 NOTE — Discharge Instructions (Addendum)
-  The Dermabond glue will dissolve on its own in 5 to 7 days.  Avoid applying any topical lotions, ointments, or conditioners, as this may prematurely dissolve the glue.  However, the glue is waterproof.  She may shower as needed.  -Follow-up with your primary care provider as needed.  -Return to the emergency department anytime if the patient begins to experience any new or worsening symptoms.

## 2021-09-24 NOTE — ED Triage Notes (Signed)
Presents via EMS form home  States her walker got away from her and she fell  having pain to right hip  She hit her head  Small laceration noted to back of head

## 2021-09-24 NOTE — ED Provider Notes (Signed)
[facility] Provider Note    Event Date/Time   First MD Initiated Contact with Patient 09/24/21 0915     (approximate)   History   Chief Complaint Fall (/)   HPI [name] is a 88 y.o. female, history of hypertension, hypercholesterolemia, PAD, Barrett's esophagus, peripheral neuropathy, presents to the emergency department for evaluation of injuries from fall.  She states that she was walking through her house with her walker when the walker accidentally rolled away from her, causing her to fall forward next to her recliner.  She states that she hit her head on something but is unsure what.  Denies LOC.  Currently endorsing pain in the right hip, lower leg, head, and neck.  She has not attempted walking since the fall.  Husband said that she was on the ground for approximately 15 minutes before EMS helped her.  Denies chest pain, shortness of breath, abdominal pain, nausea/vomiting, hearing changes, vision changes, dizziness/lightheadedness, vertigo, or numbness/tingling upper or lower extremities.  History Limitations: No limitations.        Physical Exam  Triage Vital Signs: ED Triage Vitals  Enc Vitals Group     BP 09/24/21 0911 (!) 183/59     Pulse Rate 09/24/21 0911 68     Resp 09/24/21 0911 20     Temp 09/24/21 0911 98 F (36.7 C)     Temp Source 09/24/21 0911 Oral     SpO2 09/24/21 0911 94 %     Weight 09/24/21 0905 153 lb (69.4 kg)     Height 09/24/21 0905 5' (1.524 m)     Head Circumference --      Peak Flow --      Pain Score 09/24/21 0918 3     Pain Loc --      Pain Edu? --      Excl. in [place]? --     Most recent vital signs: Vitals:   09/24/21 1000 09/24/21 1240  BP: (!) 167/54 (!) 160/50  Pulse: (!) 57 60  Resp:  18  Temp:    SpO2: 94% 97%    General: Awake, NAD.  Skin: Warm, dry. No rashes or lesions.  Eyes: PERRL. Conjunctivae normal.  CV: Good peripheral perfusion.  Resp: Normal effort.  Abd: Soft, non-tender. No  distention.  Neuro: At baseline. No gross neurological deficits.   Focused Exam: 2 cm scalp laceration on the posterior aspect of her head, right side.  No active bleeding or discharge.  No foreign bodies.  No gross deformities to the right lower extremity.  No internal/external rotation or shortening of the extremity.  PMS intact distally.    Physical Exam    ED Results / Procedures / Treatments  Labs (all labs ordered are listed, but only abnormal results are displayed) Labs Reviewed  BASIC METABOLIC PANEL - Abnormal; Notable for the following components:      Result Value   Glucose, Bld 125 (*)    Creatinine, Ser 1.10 (*)    GFR, Estimated 48 (*)    All other components within normal limits  CBC WITH DIFFERENTIAL/PLATELET     EKG N/A.   RADIOLOGY  ED Provider Interpretation: I personally viewed and interpreted these images.  Tibia/fibula x-ray negative.  Knee x-ray negative.  Pelvic x-ray negative.  CT cervical spine negative.  Head CT negative.  DG Tibia/Fibula Right  Result Date: 09/24/2021 CLINICAL DATA:  Fall onto right hip. EXAM: RIGHT TIBIA AND FIBULA - 2 VIEW; RIGHT  KNEE - COMPLETE 4+ VIEW COMPARISON:  Right knee radiographs 01/03/2007 FINDINGS: Right knee: Mild medial compartment joint space narrowing. No joint effusion. Small superior patellar degenerative osteophytosis. No acute fracture or dislocation. Vascular calcifications are noted. Right tibia and fibula: Diffuse decreased bone mineralization. Mild-to-moderate talonavicular joint space narrowing and dorsal degenerative osteophytosis. IMPRESSION: Mild medial component of the knee and mild talonavicular osteoarthritis. No acute fracture is seen. Electronically Signed   By: Yvonne Kendall M.D.   On: 09/24/2021 10:56   DG Knee Complete 4 Views Right  Result Date: 09/24/2021 CLINICAL DATA:  Fall onto right hip. EXAM: RIGHT TIBIA AND FIBULA - 2 VIEW; RIGHT KNEE - COMPLETE 4+ VIEW COMPARISON:  Right knee  radiographs 01/03/2007 FINDINGS: Right knee: Mild medial compartment joint space narrowing. No joint effusion. Small superior patellar degenerative osteophytosis. No acute fracture or dislocation. Vascular calcifications are noted. Right tibia and fibula: Diffuse decreased bone mineralization. Mild-to-moderate talonavicular joint space narrowing and dorsal degenerative osteophytosis. IMPRESSION: Mild medial component of the knee and mild talonavicular osteoarthritis. No acute fracture is seen. Electronically Signed   By: Yvonne Kendall M.D.   On: 09/24/2021 10:56   DG Hip Unilat With Pelvis 2-3 Views Right  Result Date: 09/24/2021 CLINICAL DATA:  Right hip pain. Fall. EXAM: DG HIP (WITH OR WITHOUT PELVIS) 2-3V RIGHT COMPARISON:  None Available. FINDINGS: There is no evidence of hip fracture or dislocation. There is no evidence of arthropathy or other focal bone abnormality. Degenerative changes are present in the lower lumbar spine. Bony pelvis is within normal limits. IMPRESSION: 1. Negative right hip. 2. Degenerative changes in the lower lumbar spine. Electronically Signed   By: San Morelle M.D.   On: 09/24/2021 10:54   CT Cervical Spine Wo Contrast  Result Date: 09/24/2021 CLINICAL DATA:  Neck trauma (Age >= 65y) EXAM: CT CERVICAL SPINE WITHOUT CONTRAST TECHNIQUE: Multidetector CT imaging of the cervical spine was performed without intravenous contrast. Multiplanar CT image reconstructions were also generated. RADIATION DOSE REDUCTION: This exam was performed according to the departmental dose-optimization program which includes automated exposure control, adjustment of the mA and/or kV according to patient size and/or use of iterative reconstruction technique. COMPARISON:  None Available. FINDINGS: Alignment: Facet joints are aligned without dislocation or traumatic listhesis. Dens and lateral masses are aligned. Degenerative facet-mediated grade 1 anterolisthesis of C4 on C5 and C7 on T1. Skull  base and vertebrae: No acute fracture. No primary bone lesion or focal pathologic process. Soft tissues and spinal canal: No prevertebral fluid or swelling. No visible canal hematoma. Disc levels: Multilevel intervertebral disc height loss most pronounced at C5-6 and C6-7. Advanced multilevel bilateral facet joint arthropathy. Upper chest: Right apical pleuroparenchymal scarring. Other: Bilateral carotid atherosclerosis. IMPRESSION: 1. No acute fracture or traumatic listhesis of the cervical spine. 2. Advanced multilevel cervical spondylosis. Electronically Signed   By: Davina Poke D.O.   On: 09/24/2021 10:49   CT Head Wo Contrast  Result Date: 09/24/2021 CLINICAL DATA:  Head trauma, moderate-severe EXAM: CT HEAD WITHOUT CONTRAST TECHNIQUE: Contiguous axial images were obtained from the base of the skull through the vertex without intravenous contrast. RADIATION DOSE REDUCTION: This exam was performed according to the departmental dose-optimization program which includes automated exposure control, adjustment of the mA and/or kV according to patient size and/or use of iterative reconstruction technique. COMPARISON:  None Available. FINDINGS: Brain: There is no acute intracranial hemorrhage, mass effect, or edema. Gray-white differentiation is preserved. There is no extra-axial fluid collection. Prominence of the ventricles  and sulci reflects mild parenchymal volume loss. Patchy and confluent areas of low-density in the supratentorial white matter are nonspecific but may reflect moderate chronic microvascular ischemic changes. Vascular: There is atherosclerotic calcification at the skull base. Skull: Calvarium is unremarkable. Sinuses/Orbits: Patchy mucosal thickening.  Unremarkable orbits. Other: Opacified under pneumatized mastoids. IMPRESSION: No evidence acute intracranial injury. Electronically Signed   By: Macy Mis M.D.   On: 09/24/2021 10:47    PROCEDURES:  Critical Care performed:  N/A.  Marland Kitchen.Laceration Repair  Date/Time: 09/24/2021 12:45 PM  Performed by: Teodoro Spray, PA Authorized by: Teodoro Spray, PA   Consent:    Consent obtained:  Verbal   Consent given by:  Patient   Risks, benefits, and alternatives were discussed: yes     Risks discussed:  Infection, pain, retained foreign body and poor cosmetic result   Alternatives discussed:  No treatment Universal protocol:    Patient identity confirmed:  Verbally with patient Anesthesia:    Anesthesia method:  None Laceration details:    Location:  Scalp   Scalp location:  Occipital   Length (cm):  2   Depth (mm):  3 Pre-procedure details:    Preparation:  Patient was prepped and draped in usual sterile fashion Exploration:    Hemostasis achieved with:  Direct pressure   Imaging outcome: foreign body not noted     Wound exploration: wound explored through full range of motion and entire depth of wound visualized     Wound extent: no foreign bodies/material noted, no muscle damage noted, no underlying fracture noted and no vascular damage noted   Treatment:    Area cleansed with:  Soap and water and saline   Amount of cleaning:  Standard   Irrigation solution:  Sterile saline   Irrigation volume:  500 ml   Irrigation method:  Syringe Skin repair:    Repair method:  Tissue adhesive Approximation:    Approximation:  Close Repair type:    Repair type:  Simple Post-procedure details:    Dressing:  Open (no dressing)   Procedure completion:  Tolerated well, no immediate complications     MEDICATIONS ORDERED IN ED: Medications  oxyCODONE-acetaminophen (PERCOCET/ROXICET) 5-325 MG per tablet 1 tablet (1 tablet Oral Given 09/24/21 1100)     IMPRESSION / MDM / ASSESSMENT AND PLAN / ED COURSE  I reviewed the triage vital signs and the nursing notes.                              Differential diagnosis includes, but is not limited to, hip fracture, tibia/fibular fracture, ankle sprain,  scalp laceration, concussion, epidural/subdural hematoma, subarachnoid hemorrhage.  ED Course Patient appears well, NAD.  CBC shows no leukocytosis or anemia.  BMP shows mild elevation in creatinine, otherwise unremarkable.   Assessment/Plan Patient presents with injury sustained from fall.  Patient has a 2 cm scalp laceration, otherwise unremarkable physical exam.  She appears well.  No signs of concussion.  Head CT/cervical spine CT negative.  All x-rays negative.  She is still able to stand up and ambulate as needed with assistance, consistent with her baseline.  Laceration was repaired utilizing hair apposition technique with no immediate complications.  She states that she does not need any additional pain medications at home.  Will discharge  Considered admission for this patient, but given her stable presentation and unremarkable imaging, she is unlikely to benefit from admission.  Provided the patient with  anticipatory guidance, return precautions, and educational material. Encouraged the patient to return to the emergency department at any time if they begin to experience any new or worsening symptoms. Patient expressed understanding and agreed with the plan.   Patient's presentation is most consistent with acute complicated illness / injury requiring diagnostic workup.       FINAL CLINICAL IMPRESSION(S) / ED DIAGNOSES   Final diagnoses:  Laceration of scalp, initial encounter     Rx / DC Orders   ED Discharge Orders     None        Note:  This document was prepared using Dragon voice recognition software and may include unintentional dictation errors.   Teodoro Spray, [place] 09/24/21 1248    Nena Polio, MD 09/24/21 [phone]

## 2021-09-27 ENCOUNTER — Telehealth: Payer: Self-pay

## 2021-09-27 NOTE — Telephone Encounter (Signed)
I called and spoke with patient about her recent visit to ED. I offered patient hospital f/u with NP Mable Paris. Patient declined due to th fact that she felt she got such a thorough work up at the ED with xrays, CT etc. I asked that she please call & let us know if she changed her mind.

## 2021-10-11 DIAGNOSIS — M5136 Other intervertebral disc degeneration, lumbar region: Secondary | ICD-10-CM | POA: Diagnosis not present

## 2021-10-11 DIAGNOSIS — M48062 Spinal stenosis, lumbar region with neurogenic claudication: Secondary | ICD-10-CM | POA: Diagnosis not present

## 2021-10-11 DIAGNOSIS — Z79899 Other long term (current) drug therapy: Secondary | ICD-10-CM | POA: Diagnosis not present

## 2021-10-11 DIAGNOSIS — M62830 Muscle spasm of back: Secondary | ICD-10-CM | POA: Diagnosis not present

## 2021-10-11 DIAGNOSIS — M5416 Radiculopathy, lumbar region: Secondary | ICD-10-CM | POA: Diagnosis not present

## 2021-10-11 DIAGNOSIS — M7918 Myalgia, other site: Secondary | ICD-10-CM | POA: Diagnosis not present

## 2021-10-25 ENCOUNTER — Other Ambulatory Visit: Payer: Medicare HMO

## 2021-11-03 ENCOUNTER — Telehealth: Payer: Self-pay

## 2021-11-03 NOTE — Telephone Encounter (Signed)
Patient's daughter, Ihor Dow, called to state she would like to chat with a nurse or Dr. [name] regarding reason for re-drawing the labs.  [name] states patient fell a few weeks ago and is working on regaining her confidence with walking, so this is not the best time for her to come to the office.

## 2021-11-04 ENCOUNTER — Other Ambulatory Visit: Payer: Medicare HMO

## 2021-11-04 NOTE — Telephone Encounter (Signed)
Please clarify what labs need to be redrawn.  I do not see an appt scheduled.  Please see if lab called her or who called her for repeat lab

## 2021-11-04 NOTE — Telephone Encounter (Signed)
Spoke to daughter [name] and she stated that the Mom had gotten a call to have labs redrawn? Was not sure if there was some confusion on the Mom part, or exactly what may have happened but [name] is ok with not having to redraw labs.

## 2021-11-12 ENCOUNTER — Other Ambulatory Visit: Payer: Self-pay

## 2021-11-12 ENCOUNTER — Telehealth: Payer: Self-pay

## 2021-11-12 DIAGNOSIS — I509 Heart failure, unspecified: Secondary | ICD-10-CM

## 2021-11-12 MED ORDER — ALBUTEROL SULFATE HFA 108 (90 BASE) MCG/ACT IN AERS: INHALATION_SPRAY | RESPIRATORY_TRACT | 1 refills | Status: DC

## 2021-11-12 NOTE — Telephone Encounter (Signed)
Medication was sent to patients pharmacy per request and per provider  Authur Cubit,cma

## 2021-11-12 NOTE — Telephone Encounter (Signed)
Patient's daughter, Ihor Dow, called to state patient has congestive heart failure and cardiologist has increased her furosemide (LASIX) 20 MG tablet from one tablet to two daily temporarily to pull fluid off.  [name] states Dr. [name] gave patient an inhaler (albuterol (VENTOLIN HFA) 108 (90 Base) MCG/ACT inhaler) and they would like to know if Dr. Nicki Reaper would be willing to refill it.  *[name] states patient's preferred pharmacy [facility] on [address].

## 2021-11-16 ENCOUNTER — Inpatient Hospital Stay
Admission: EM | Admit: 2021-11-16 | Discharge: 2021-11-20 | DRG: 291 | Disposition: A | Discharge status: Home / Self Care | Payer: Medicare HMO | Attending: Internal Medicine | Admitting: Internal Medicine

## 2021-11-16 ENCOUNTER — Emergency Department: Payer: Medicare HMO

## 2021-11-16 DIAGNOSIS — Z85828 Personal history of other malignant neoplasm of skin: Secondary | ICD-10-CM

## 2021-11-16 DIAGNOSIS — Z66 Do not resuscitate: Secondary | ICD-10-CM | POA: Diagnosis present

## 2021-11-16 DIAGNOSIS — I4891 Unspecified atrial fibrillation: Secondary | ICD-10-CM

## 2021-11-16 DIAGNOSIS — J9601 Acute respiratory failure with hypoxia: Secondary | ICD-10-CM | POA: Diagnosis not present

## 2021-11-16 DIAGNOSIS — Z85038 Personal history of other malignant neoplasm of large intestine: Secondary | ICD-10-CM | POA: Diagnosis not present

## 2021-11-16 DIAGNOSIS — I5031 Acute diastolic (congestive) heart failure: Secondary | ICD-10-CM | POA: Diagnosis not present

## 2021-11-16 DIAGNOSIS — Z9221 Personal history of antineoplastic chemotherapy: Secondary | ICD-10-CM

## 2021-11-16 DIAGNOSIS — M25562 Pain in left knee: Secondary | ICD-10-CM | POA: Diagnosis not present

## 2021-11-16 DIAGNOSIS — R0602 Shortness of breath: Secondary | ICD-10-CM | POA: Diagnosis not present

## 2021-11-16 DIAGNOSIS — Z803 Family history of malignant neoplasm of breast: Secondary | ICD-10-CM

## 2021-11-16 DIAGNOSIS — N179 Acute kidney failure, unspecified: Secondary | ICD-10-CM | POA: Diagnosis not present

## 2021-11-16 DIAGNOSIS — N183 Chronic kidney disease, stage 3 unspecified: Secondary | ICD-10-CM | POA: Diagnosis present

## 2021-11-16 DIAGNOSIS — I130 Hypertensive heart and chronic kidney disease with heart failure and stage 1 through stage 4 chronic kidney disease, or unspecified chronic kidney disease: Principal | ICD-10-CM | POA: Diagnosis present

## 2021-11-16 DIAGNOSIS — Z8041 Family history of malignant neoplasm of ovary: Secondary | ICD-10-CM | POA: Diagnosis not present

## 2021-11-16 DIAGNOSIS — Z1152 Encounter for screening for COVID-19: Secondary | ICD-10-CM

## 2021-11-16 DIAGNOSIS — I10 Essential (primary) hypertension: Secondary | ICD-10-CM | POA: Diagnosis present

## 2021-11-16 DIAGNOSIS — J90 Pleural effusion, not elsewhere classified: Secondary | ICD-10-CM | POA: Diagnosis not present

## 2021-11-16 DIAGNOSIS — J811 Chronic pulmonary edema: Secondary | ICD-10-CM | POA: Diagnosis not present

## 2021-11-16 DIAGNOSIS — Z825 Family history of asthma and other chronic lower respiratory diseases: Secondary | ICD-10-CM

## 2021-11-16 DIAGNOSIS — I480 Paroxysmal atrial fibrillation: Secondary | ICD-10-CM | POA: Diagnosis not present

## 2021-11-16 DIAGNOSIS — I4819 Other persistent atrial fibrillation: Secondary | ICD-10-CM | POA: Diagnosis not present

## 2021-11-16 DIAGNOSIS — G8929 Other chronic pain: Secondary | ICD-10-CM | POA: Diagnosis present

## 2021-11-16 DIAGNOSIS — R609 Edema, unspecified: Secondary | ICD-10-CM | POA: Diagnosis not present

## 2021-11-16 DIAGNOSIS — I739 Peripheral vascular disease, unspecified: Secondary | ICD-10-CM | POA: Diagnosis not present

## 2021-11-16 DIAGNOSIS — Z83438 Family history of other disorder of lipoprotein metabolism and other lipidemia: Secondary | ICD-10-CM

## 2021-11-16 DIAGNOSIS — Z79899 Other long term (current) drug therapy: Secondary | ICD-10-CM | POA: Diagnosis not present

## 2021-11-16 DIAGNOSIS — R062 Wheezing: Secondary | ICD-10-CM | POA: Diagnosis not present

## 2021-11-16 DIAGNOSIS — Z823 Family history of stroke: Secondary | ICD-10-CM

## 2021-11-16 DIAGNOSIS — Z8249 Family history of ischemic heart disease and other diseases of the circulatory system: Secondary | ICD-10-CM | POA: Diagnosis not present

## 2021-11-16 DIAGNOSIS — Z8042 Family history of malignant neoplasm of prostate: Secondary | ICD-10-CM | POA: Diagnosis not present

## 2021-11-16 DIAGNOSIS — I5033 Acute on chronic diastolic (congestive) heart failure: Secondary | ICD-10-CM | POA: Diagnosis not present

## 2021-11-16 DIAGNOSIS — E7800 Pure hypercholesterolemia, unspecified: Secondary | ICD-10-CM | POA: Diagnosis present

## 2021-11-16 DIAGNOSIS — I509 Heart failure, unspecified: Secondary | ICD-10-CM | POA: Diagnosis not present

## 2021-11-16 DIAGNOSIS — Z833 Family history of diabetes mellitus: Secondary | ICD-10-CM | POA: Diagnosis not present

## 2021-11-16 DIAGNOSIS — M4186 Other forms of scoliosis, lumbar region: Secondary | ICD-10-CM | POA: Diagnosis present

## 2021-11-16 DIAGNOSIS — Z7401 Bed confinement status: Secondary | ICD-10-CM | POA: Diagnosis not present

## 2021-11-16 DIAGNOSIS — D500 Iron deficiency anemia secondary to blood loss (chronic): Secondary | ICD-10-CM

## 2021-11-16 DIAGNOSIS — K2270 Barrett's esophagus without dysplasia: Secondary | ICD-10-CM | POA: Diagnosis present

## 2021-11-16 DIAGNOSIS — D649 Anemia, unspecified: Secondary | ICD-10-CM | POA: Diagnosis present

## 2021-11-16 DIAGNOSIS — I110 Hypertensive heart disease with heart failure: Secondary | ICD-10-CM | POA: Diagnosis not present

## 2021-11-16 DIAGNOSIS — I5023 Acute on chronic systolic (congestive) heart failure: Secondary | ICD-10-CM

## 2021-11-16 DIAGNOSIS — R0902 Hypoxemia: Secondary | ICD-10-CM | POA: Diagnosis not present

## 2021-11-16 DIAGNOSIS — R509 Fever, unspecified: Secondary | ICD-10-CM

## 2021-11-16 DIAGNOSIS — R0689 Other abnormalities of breathing: Secondary | ICD-10-CM | POA: Diagnosis not present

## 2021-11-16 DIAGNOSIS — K219 Gastro-esophageal reflux disease without esophagitis: Secondary | ICD-10-CM | POA: Diagnosis present

## 2021-11-16 DIAGNOSIS — R69 Illness, unspecified: Secondary | ICD-10-CM | POA: Diagnosis not present

## 2021-11-16 LAB — COMPREHENSIVE METABOLIC PANEL
ALT: 9 U/L (ref 0–44)
AST: 13 U/L — ABNORMAL LOW (ref 15–41)
Albumin: 3.2 g/dL — ABNORMAL LOW (ref 3.5–5.0)
Alkaline Phosphatase: 47 U/L (ref 38–126)
Anion gap: 10 (ref 5–15)
BUN: 37 mg/dL — ABNORMAL HIGH (ref 8–23)
CO2: 24 mmol/L (ref 22–32)
Calcium: 8.7 mg/dL — ABNORMAL LOW (ref 8.9–10.3)
Chloride: 100 mmol/L (ref 98–111)
Creatinine, Ser: 1.47 mg/dL — ABNORMAL HIGH (ref 0.44–1.00)
GFR, Estimated: 34 mL/min — ABNORMAL LOW (ref >60)
Glucose, Bld: 149 mg/dL — ABNORMAL HIGH (ref 70–99)
Potassium: 3.9 mmol/L (ref 3.5–5.1)
Sodium: 134 mmol/L — ABNORMAL LOW (ref 135–145)
Total Bilirubin: 1.5 mg/dL — ABNORMAL HIGH (ref 0.3–1.2)
Total Protein: 6.5 g/dL (ref 6.5–8.1)

## 2021-11-16 LAB — MAGNESIUM: Magnesium: 2 mg/dL (ref 1.7–2.4)

## 2021-11-16 LAB — URINALYSIS, COMPLETE (UACMP) WITH MICROSCOPIC
Bilirubin Urine: NEGATIVE
Glucose, UA: NEGATIVE mg/dL
Hgb urine dipstick: NEGATIVE
Ketones, ur: NEGATIVE mg/dL
Nitrite: NEGATIVE
Protein, ur: NEGATIVE mg/dL
Specific Gravity, Urine: 1.008 (ref 1.005–1.030)
pH: 5 (ref 5.0–8.0)

## 2021-11-16 LAB — CBC WITH DIFFERENTIAL/PLATELET
Abs Immature Granulocytes: 0.03 10*3/uL (ref 0.00–0.07)
Basophils Absolute: 0.1 10*3/uL (ref 0.0–0.1)
Basophils Relative: 1 %
Eosinophils Absolute: 0.3 10*3/uL (ref 0.0–0.5)
Eosinophils Relative: 4 %
HCT: 33.8 % — ABNORMAL LOW (ref 36.0–46.0)
Hemoglobin: 10.5 g/dL — ABNORMAL LOW (ref 12.0–15.0)
Immature Granulocytes: 0 %
Lymphocytes Relative: 6 %
Lymphs Abs: 0.5 10*3/uL — ABNORMAL LOW (ref 0.7–4.0)
MCH: 31.7 pg (ref 26.0–34.0)
MCHC: 31.1 g/dL (ref 30.0–36.0)
MCV: 102.1 fL — ABNORMAL HIGH (ref 80.0–100.0)
Monocytes Absolute: 1 10*3/uL (ref 0.1–1.0)
Monocytes Relative: 10 %
Neutro Abs: 7.9 10*3/uL — ABNORMAL HIGH (ref 1.7–7.7)
Neutrophils Relative %: 79 %
Platelets: 283 10*3/uL (ref 150–400)
RBC: 3.31 MIL/uL — ABNORMAL LOW (ref 3.87–5.11)
RDW: 13.4 % (ref 11.5–15.5)
WBC: 9.9 10*3/uL (ref 4.0–10.5)
nRBC: 0 % (ref 0.0–0.2)

## 2021-11-16 LAB — RESP PANEL BY RT-PCR (FLU A&B, COVID) ARPGX2
Influenza A by PCR: NEGATIVE
Influenza B by PCR: NEGATIVE
SARS Coronavirus 2 by RT PCR: NEGATIVE

## 2021-11-16 LAB — PROTIME-INR
INR: 1.2 (ref 0.8–1.2)
Prothrombin Time: 14.6 seconds (ref 11.4–15.2)

## 2021-11-16 LAB — LACTIC ACID, PLASMA: Lactic Acid, Venous: 1 mmol/L (ref 0.5–1.9)

## 2021-11-16 LAB — PROCALCITONIN: Procalcitonin: 0.15 ng/mL

## 2021-11-16 LAB — BRAIN NATRIURETIC PEPTIDE: B Natriuretic Peptide: 600.3 pg/mL — ABNORMAL HIGH (ref 0.0–100.0)

## 2021-11-16 LAB — APTT: aPTT: 30 seconds (ref 24–36)

## 2021-11-16 MED ORDER — SODIUM CHLORIDE 0.9 % IV SOLN
2.0000 g | INTRAVENOUS | Status: DC
  Administered 2021-11-16: 2 g via INTRAVENOUS
  Filled 2021-11-16: qty 20

## 2021-11-16 MED ORDER — SODIUM CHLORIDE 0.9 % IV SOLN: 2.0000 g | INTRAVENOUS | Status: DC

## 2021-11-16 MED ORDER — OXYCODONE HCL 5 MG PO TABS
5.0000 mg | ORAL_TABLET | ORAL | Status: AC
  Administered 2021-11-16: 5 mg via ORAL
  Filled 2021-11-16: qty 1

## 2021-11-16 MED ORDER — ACETAMINOPHEN 325 MG PO TABS: 650.0000 mg | ORAL_TABLET | Freq: Four times a day (QID) | ORAL | Status: DC | PRN

## 2021-11-16 MED ORDER — SODIUM CHLORIDE 0.9 % IV SOLN
500.0000 mg | INTRAVENOUS | Status: DC
  Administered 2021-11-16: 500 mg via INTRAVENOUS
  Filled 2021-11-16: qty 5

## 2021-11-16 MED ORDER — SODIUM CHLORIDE 0.9 % IV SOLN: 500.0000 mg | INTRAVENOUS | Status: DC

## 2021-11-16 MED ORDER — OXYCODONE HCL 5 MG PO TABS
5.0000 mg | ORAL_TABLET | ORAL | Status: DC | PRN
  Administered 2021-11-17: 5 mg via ORAL
  Filled 2021-11-16: qty 1

## 2021-11-16 MED ORDER — ENOXAPARIN SODIUM 30 MG/0.3ML IJ SOSY
30.0000 mg | PREFILLED_SYRINGE | INTRAMUSCULAR | Status: DC
  Administered 2021-11-17: 30 mg via SUBCUTANEOUS
  Filled 2021-11-16: qty 0.3

## 2021-11-16 MED ORDER — ACETAMINOPHEN 650 MG RE SUPP: 650.0000 mg | Freq: Four times a day (QID) | RECTAL | Status: DC | PRN

## 2021-11-16 MED ORDER — FUROSEMIDE 10 MG/ML IJ SOLN
40.0000 mg | Freq: Two times a day (BID) | INTRAMUSCULAR | Status: DC
  Administered 2021-11-17: 40 mg via INTRAVENOUS
  Filled 2021-11-16: qty 4

## 2021-11-16 MED ORDER — FUROSEMIDE 10 MG/ML IJ SOLN
40.0000 mg | Freq: Once | INTRAMUSCULAR | Status: AC
  Administered 2021-11-16: 40 mg via INTRAVENOUS
  Filled 2021-11-16: qty 4

## 2021-11-16 MED ORDER — ENOXAPARIN SODIUM 40 MG/0.4ML IJ SOSY: 40.0000 mg | PREFILLED_SYRINGE | INTRAMUSCULAR | Status: DC

## 2021-11-16 MED ORDER — ALBUTEROL SULFATE (2.5 MG/3ML) 0.083% IN NEBU
2.5000 mg | INHALATION_SOLUTION | Freq: Once | RESPIRATORY_TRACT | Status: AC
  Administered 2021-11-16: 2.5 mg via RESPIRATORY_TRACT
  Filled 2021-11-16: qty 3

## 2021-11-16 MED ORDER — ONDANSETRON HCL 4 MG PO TABS: 4.0000 mg | ORAL_TABLET | Freq: Four times a day (QID) | ORAL | Status: DC | PRN

## 2021-11-16 MED ORDER — SIMVASTATIN 20 MG PO TABS
10.0000 mg | ORAL_TABLET | Freq: Every day | ORAL | Status: DC
  Administered 2021-11-17 – 2021-11-19 (×3): 10 mg via ORAL
  Filled 2021-11-16 (×3): qty 1

## 2021-11-16 MED ORDER — METOPROLOL SUCCINATE ER 25 MG PO TB24
25.0000 mg | ORAL_TABLET | Freq: Two times a day (BID) | ORAL | Status: DC
  Administered 2021-11-17 – 2021-11-20 (×7): 25 mg via ORAL
  Filled 2021-11-16 (×7): qty 1

## 2021-11-16 MED ORDER — GABAPENTIN 100 MG PO CAPS: 200.0000 mg | ORAL_CAPSULE | ORAL | Status: DC

## 2021-11-16 MED ORDER — ALBUTEROL SULFATE HFA 108 (90 BASE) MCG/ACT IN AERS: 1.0000 | INHALATION_SPRAY | RESPIRATORY_TRACT | Status: DC | PRN

## 2021-11-16 MED ORDER — IRBESARTAN 150 MG PO TABS
300.0000 mg | ORAL_TABLET | Freq: Every day | ORAL | Status: DC
  Administered 2021-11-17 – 2021-11-20 (×4): 300 mg via ORAL
  Filled 2021-11-16 (×4): qty 2

## 2021-11-16 MED ORDER — ONDANSETRON HCL 4 MG/2ML IJ SOLN: 4.0000 mg | Freq: Four times a day (QID) | INTRAMUSCULAR | Status: DC | PRN

## 2021-11-16 NOTE — ED Provider Notes (Addendum)
[facility] Provider Note    Event Date/Time   First MD Initiated Contact with Patient 11/16/21 1956     (approximate)   History   Chief Complaint: Shortness of Breath   HPI  [name] is a 88 y.o. female with a history of hypertension, peripheral edema who is brought to the ED due to shortness of breath for the last 3 days, worse with ambulation.  Associated with generalized weakness.  She does have a history of CHF and is on Lasix which was recently increased from 20 mg daily to 40 mg daily without improvement.  She also has cough.  EMS noted atrial fibrillation which she denies any previous history of.  I reviewed outside records from cardiology clinic where she was seen in July 2023.  No mention of atrial fibrillation at that time.  EKG performed in August does show atrial fibrillation, and EKG is previous to this time shows sinus rhythm.     Physical Exam   Triage Vital Signs: ED Triage Vitals  Enc Vitals Group     BP 11/16/21 2005 (!) 185/67     Pulse Rate 11/16/21 2005 78     Resp 11/16/21 2005 20     Temp 11/16/21 2005 100.2 F (37.9 C)     Temp Source 11/16/21 2005 Oral     SpO2 11/16/21 2005 99 %     Weight 11/16/21 2007 160 lb (72.6 kg)     Height 11/16/21 2007 '5\' 4"'$  (1.626 m)     Head Circumference --      Peak Flow --      Pain Score --      Pain Loc --      Pain Edu? --      Excl. in [place]? --     Most recent vital signs: Vitals:   11/16/21 2130 11/16/21 2200  BP: (!) 140/62 (!) 143/59  Pulse: 82 75  Resp: (!) 21 (!) 22  Temp:    SpO2: 100% 98%    General: Awake, no distress.  Ill-appearing CV:  Good peripheral perfusion.  Irregularly irregular rhythm, heart rate 80.  Thready distal pulses. Resp:  Normal effort.  Tachypnea, respiratory rate of 24.  Bilateral basilar crackles. Abd:  No distention.  Soft and nontender Other:  2+ pitting edema bilateral lower extremities   ED Results / Procedures / Treatments    Labs (all labs ordered are listed, but only abnormal results are displayed) Labs Reviewed  COMPREHENSIVE METABOLIC PANEL - Abnormal; Notable for the following components:      Result Value   Sodium 134 (*)    Glucose, Bld 149 (*)    BUN 37 (*)    Creatinine, Ser 1.47 (*)    Calcium 8.7 (*)    Albumin 3.2 (*)    AST 13 (*)    Total Bilirubin 1.5 (*)    GFR, Estimated 34 (*)    All other components within normal limits  CBC WITH DIFFERENTIAL/PLATELET - Abnormal; Notable for the following components:   RBC 3.31 (*)    Hemoglobin 10.5 (*)    HCT 33.8 (*)    MCV 102.1 (*)    Neutro Abs 7.9 (*)    Lymphs Abs 0.5 (*)    All other components within normal limits  URINALYSIS, COMPLETE (UACMP) WITH MICROSCOPIC - Abnormal; Notable for the following components:   Color, Urine STRAW (*)    APPearance CLEAR (*)    Leukocytes,Ua MODERATE (*)  Bacteria, UA RARE (*)    All other components within normal limits  BLOOD GAS, VENOUS - Abnormal; Notable for the following components:   pCO2, Ven 41 (*)    All other components within normal limits  RESP PANEL BY RT-PCR (FLU A&B, COVID) ARPGX2  CULTURE, BLOOD (ROUTINE X 2)  CULTURE, BLOOD (ROUTINE X 2)  URINE CULTURE  LACTIC ACID, PLASMA  PROTIME-INR  APTT  PROCALCITONIN  MAGNESIUM  LACTIC ACID, PLASMA  BRAIN NATRIURETIC PEPTIDE     EKG Interpreted by me Atrial fibrillation, rate of 89.  Normal axis, normal intervals.  Poor R wave progression.  Normal ST segments and T waves.  No ischemic changes   RADIOLOGY Chest x-ray interpreted by me, shows bilateral basilar infiltrates, right greater than left.  Possible edema with superimposed pneumonia.  Radiology report reviewed   PROCEDURES:  .Critical Care  Performed by: Carrie Mew, MD Authorized by: Carrie Mew, MD   Critical care provider statement:    Critical care time (minutes):  35   Critical care time was exclusive of:  Separately billable procedures and  treating other patients   Critical care was necessary to treat or prevent imminent or life-threatening deterioration of the following conditions:  Sepsis, respiratory failure and cardiac failure   Critical care was time spent personally by me on the following activities:  Development of treatment plan with patient or surrogate, discussions with consultants, evaluation of patient's response to treatment, examination of patient, obtaining history from patient or surrogate, ordering and performing treatments and interventions, ordering and review of laboratory studies, ordering and review of radiographic studies, pulse oximetry, re-evaluation of patient's condition and review of old charts Comments:        .1-3 Lead EKG Interpretation  Performed by: Carrie Mew, MD Authorized by: Carrie Mew, MD     Interpretation: abnormal     ECG rate:  80   ECG rate assessment: normal     Rhythm: atrial fibrillation     Ectopy: none     Conduction: normal   Comments:        Angiocath insertion  Date/Time: 11/16/2021 9:15 PM  Performed by: Carrie Mew, MD Authorized by: Carrie Mew, MD  Consent: Verbal consent obtained. Preparation: Patient was prepped and draped in the usual sterile fashion. Local anesthesia used: no  Anesthesia: Local anesthesia used: no  Sedation: Patient sedated: no  Patient tolerance: patient tolerated the procedure well with no immediate complications Comments: 86P IV, continuous [place] visualization, R AC, 1 attempt, no complications. EBL 0.       MEDICATIONS ORDERED IN ED: Medications  cefTRIAXone (ROCEPHIN) 2 g in sodium chloride 0.9 % 100 mL IVPB (0 g Intravenous Stopped 11/16/21 2131)  azithromycin (ZITHROMAX) 500 mg in sodium chloride 0.9 % 250 mL IVPB (0 mg Intravenous Stopped 11/16/21 2232)  gabapentin (NEURONTIN) capsule 200 mg (has no administration in time range)  oxyCODONE (Oxy IR/ROXICODONE) immediate release tablet 5 mg (has no  administration in time range)  albuterol (PROVENTIL) (2.5 MG/3ML) 0.083% nebulizer solution 2.5 mg (has no administration in time range)  furosemide (LASIX) injection 40 mg (40 mg Intravenous Given 11/16/21 2152)     IMPRESSION / MDM / ASSESSMENT AND PLAN / ED COURSE  I reviewed the triage vital signs and the nursing notes.                              Differential diagnosis includes, but is not limited  to, pulmonary edema, non-STEMI, new onset atrial fibrillation, pneumonia, sepsis, pleural effusion, AKI, electrolyte abnormality, anemia  Patient's presentation is most consistent with acute presentation with potential threat to life or bodily function.  Patient presents with hypoxic respiratory failure in the setting of new onset of atrial fibrillation, clinically apparent volume overload, and abnormal vital signs with fever and tachypnea and hypoxia.  Sepsis protocol initiated, empiric antibiotics with ceftriaxone and azithromycin.  We will give IV Lasix to initiate diuresis, follow-up labs, and plan for hospitalization.  Peripheral IV placed by me under ultrasound visualization due to multiple unsuccessful attempts by nursing.  Clinical Course as of 11/16/21 2311  Tue Nov 16, 2021  2233 COVID and flu negative.  Procalcitonin negative.  This appears to be pulmonary edema from CHF exacerbation which is not responding to outpatient management.   [PS]    Clinical Course User Index [PS] Carrie Mew, MD     FINAL CLINICAL IMPRESSION(S) / ED DIAGNOSES   Final diagnoses:  Acute respiratory failure with hypoxia ([place])  Acute on chronic systolic congestive heart failure (H[facility])  Atrial fibrillation, unspecified type ([place])     Rx / DC Orders   ED Discharge Orders     None        Note:  This document was prepared using Dragon voice recognition software and may include unintentional dictation errors.   Carrie Mew, MD 11/16/21 2116    Carrie Mew, MD 11/16/21  [phone]

## 2021-11-16 NOTE — Consult Note (Signed)
CODE SEPSIS - PHARMACY COMMUNICATION  **Broad Spectrum Antibiotics should be administered within 1 hour of Sepsis diagnosis**  Time Code Sepsis Called/Page Received: 2008  Antibiotics Ordered: ceftriaxone/azithromycin  Time of 1st antibiotic administration: 2101  Additional action taken by pharmacy: n/a  If necessary, Name of Provider/Nurse Contacted: [place] ,[place] Pharmacist  11/16/2021  8:08 PM

## 2021-11-16 NOTE — Assessment & Plan Note (Addendum)
Potential triggers include A-fib not previously diagnosed, symptomatic anemia, possible pneumonia, possible ACS IV Lasix and continue metoprolol and losartan Continue to trend troponins.  Treat potential etiologies Hold amlodipine Daily weights with intake and output monitoring Echocardiogram

## 2021-11-16 NOTE — H&P (Signed)
History and Physical    Patient: [name] EXB:[id number] DOB: 1928-10-03 DOA: 11/16/2021 DOS: the patient was seen and examined on 11/16/2021 PCP: [name], MD  Patient coming from: Home  Chief Complaint:  Chief Complaint  Patient presents with   Shortness of Breath    HPI: [name] is a 88 y.o. female with medical history significant for Chronic diastolic heart failure, hypertension, PAD, chronic back pain, who presents to the ED with a several day history of dyspnea on exertion.  Patient has a several month history of lower extremity edema, and has been on increasing doses of Lasix as an outpatient, however her symptoms have not improved and continues to have worsening shortness of breath.  She denies chest pain.  Has no cough, fever or chills or palpitations.  Patient was brought in by EMS who recorded up O2 sat in the low 80s on room air and she arrived to the ED on 6 L O2 via nasal cannula. ED course and data review: Vitals with temperature 100.2, BP 185/67 with pulse 78 respirations 20-22, maintaining sats in the high 90s on O2 at 6 L which was eventually weaned to 2 L.  Venous blood gas showed pH of 7.42 with PCO2 41.  Labs otherwise significant for hemoglobin of 10.5, down from 13.5 a month ago, creatinine of 1.47 up from baseline of 1.01, bilirubin 1.5.  Urinalysis with moderate leukocyte esterase and trace bacteria.  Procalcitonin 0.15.  Lactic acid pending. EKG, personally reviewed and interpreted showing A-fib at 16 with no acute ST-T wave changes Chest x-ray consistent with CHF showing the following:  Patient was treated with IV Lasix 40 mg and given temperature of 100.2 she was empirically started on Rocephin and azithromycin and given a DuoNeb.  Hospitalist consulted for admission.     Past Medical History:  Diagnosis Date   Anemia    Barrett's esophagus    BCC (basal cell carcinoma of skin) 03/07/2013   vertex scalp - NODULAR NODULAR PATTERN PATTERN    Bowel obstruction (HCC)    s/p adhesion resection   Chronic back pain    Colon cancer ([place]) 1988 and 1989   adenocarcinoma, s/p resection x  2 and chemo   Diverticulitis    GERD (gastroesophageal reflux disease)    H/O open leg wound    Hiatal hernia    Hypercholesteremia    Hypertension    Lumbar scoliosis    OA (osteoarthritis)    Peripheral neuropathy    Recurrent sinus infections    Renal cyst    S/P chemotherapy, time since greater than 12 weeks    colon cancer   Vertigo    Past Surgical History:  Procedure Laterality Date   ABDOMINAL HYSTERECTOMY  1982   adhesions resected     bowel obstruction   APPENDECTOMY     BREAST BIOPSY Left    negative 06/14/1985   CHOLECYSTECTOMY  1994   COLON RESECTION     x2.  s/p colon cancer   COLON SURGERY  1958 and [place]   EXCISIONAL HEMORRHOIDECTOMY     with tubal ligation   EXPLORATORY LAPAROTOMY  1991   Secondary to SBO   Social History:  reports that she has never smoked. She has never used smokeless tobacco. She reports that she does not drink alcohol and does not use drugs.  Allergies  Allergen Reactions   Fentanyl Other (See Comments)    confusion    Family  History  Problem Relation Age of Onset   Breast cancer Mother    Asthma Mother    Stroke Father    Hypertension Father    Diabetes Father    Ovarian cancer Sister        x2   Prostate cancer Brother    Hypertension Brother        x3   Heart disease Brother    Hypercholesterolemia Brother        x3   Diabetes Brother    Spina bifida Grandchild    Hematuria Neg Hx    Kidney cancer Neg Hx    Kidney disease Neg Hx    Sickle cell trait Neg Hx    Tuberculosis Neg Hx     Prior to Admission medications   Medication Sig Start Date End Date Taking? Authorizing Provider  albuterol (VENTOLIN HFA) 108 (90 Base) MCG/ACT inhaler TAKE 2 PUFFS BY MOUTH EVERY 6 HOURS AS NEEDED FOR WHEEZE OR SHORTNESS OF BREATH 11/12/21   [name], MD   amLODipine (NORVASC) 5 MG tablet TAKE 1 TABLET EVERY DAY 10/13/20   [name], MD  calcium carbonate (OSCAL) 1500 (600 Ca) MG TABS tablet Take 600 mg of elemental calcium by mouth 2 (two) times daily with a meal.    [provider]  Cholecalciferol (VITAMIN D3) 50 MCG (2000 UT) capsule Take 2,000 Units by mouth daily.    [provider]  docusate sodium (COLACE) 100 MG capsule Take 100 mg by mouth 2 (two) times daily.    [provider]  fexofenadine (ALLEGRA) 60 MG tablet TAKE 1 TABLET EVERY DAY 08/29/21   Dutch Quint B, FNP  furosemide (LASIX) 20 MG tablet TAKE 1 TABLET EVERY DAY 07/06/21   [name], MD  gabapentin (NEURONTIN) 100 MG capsule TAKE 2 CAPSULES AT BEDTIME 05/18/20   [name], MD  irbesartan (AVAPRO) 300 MG tablet TAKE 1 TABLET EVERY DAY 03/23/21   [name], MD  lisinopril-hydrochlorothiazide (ZESTORETIC) 10-12.5 MG tablet Take 1 tablet by mouth daily.    [provider]  losartan (COZAAR) 50 MG tablet Take 50 mg by mouth daily.    [provider]  meclizine (ANTIVERT) 12.5 MG tablet Take 1 tablet (12.5 mg total) by mouth 2 (two) times daily as needed for dizziness. 10/29/19   [name], MD  metoCLOPramide (REGLAN) 5 MG tablet Take 5 mg by mouth 2 (two) times daily. 04/09/19   [provider]  metoprolol succinate (TOPROL-XL) 25 MG 24 hr tablet TAKE 1 TABLET TWICE DAILY 04/27/21   [name], MD  mometasone (ELOCON) 0.1 % cream Apply 1 application topically See admin instructions. Apply at bedtime to affected areas under breast and lower abdominal folder on tuesdays, thursdays, and Saturdays weekly. 03/04/21   Ralene Bathe, MD  ondansetron (ZOFRAN ODT) 4 MG disintegrating tablet Take 1 tablet (4 mg total) by mouth 2 (two) times daily as needed for nausea or vomiting. 10/29/19   [name], MD  oxyCODONE (OXY IR/ROXICODONE) 5 MG immediate release tablet Take 5 mg by mouth 3 (three) times daily.      [provider]  simvastatin (ZOCOR) 10 MG tablet TAKE 1 TABLET (10 MG TOTAL) AT BEDTIME. 02/23/21   [name], MD  Spacer/Aero-Holding Josiah Lobo ([place]) [place]  06/26/19   [provider]  traMADol (ULTRAM) 50 MG tablet Take 1 tablet by mouth at bedtime as needed. 07/07/20   [provider]  traMADol-acetaminophen (ULTRACET) 37.5-325 MG per tablet Take  1 tablet by mouth 2 (two) times daily as needed.     [provider]    Physical Exam: Vitals:   11/16/21 2005 11/16/21 2007 11/16/21 2130 11/16/21 2200  BP: (!) 185/67  (!) 140/62 (!) 143/59  Pulse: 78  82 75  Resp: 20  (!) 21 (!) 22  Temp: 100.2 F (37.9 C)     TempSrc: Oral     SpO2: 99%  100% 98%  Weight:  72.6 kg    Height:  '5\' 4"'$  (1.626 m)     Physical Exam Vitals and nursing note reviewed.  Constitutional:      General: She is awake. She is not in acute distress.    Comments: Conversational dyspnea, speaking in short sentences  HENT:     Head: Normocephalic and atraumatic.  Cardiovascular:     Rate and Rhythm: Normal rate and regular rhythm.     Heart sounds: Normal heart sounds.  Pulmonary:     Effort: Tachypnea present.     Breath sounds: Examination of the right-lower field reveals rales. Examination of the left-lower field reveals rales. Rales present.  Abdominal:     Palpations: Abdomen is soft.     Tenderness: There is no abdominal tenderness.  Musculoskeletal:     Right lower leg: Edema present.     Left lower leg: Edema present.  Neurological:     Mental Status: She is alert. Mental status is at baseline.     Labs on Admission: I have personally reviewed following labs and imaging studies  CBC: Recent Labs  Lab 11/16/21 2036  WBC 9.9  NEUTROABS 7.9*  HGB 10.5*  HCT 33.8*  MCV 102.1*  PLT 536   Basic Metabolic Panel: Recent Labs  Lab 11/16/21 2036 11/16/21 2140  NA 134*  --   K 3.9  --   CL 100  --   CO2 24  --   GLUCOSE 149*  --   BUN  37*  --   CREATININE 1.47*  --   CALCIUM 8.7*  --   MG  --  2.0   GFR: Estimated Creatinine Clearance: 24.9 mL/min (A) (by C-G formula based on SCr of 1.47 mg/dL (H)). Liver Function Tests: Recent Labs  Lab 11/16/21 2036  AST 13*  ALT 9  ALKPHOS 47  BILITOT 1.5*  PROT 6.5  ALBUMIN 3.2*   No results for input(s): "LIPASE", "AMYLASE" in the last 168 hours. No results for input(s): "AMMONIA" in the last 168 hours. Coagulation Profile: Recent Labs  Lab 11/16/21 2036  INR 1.2   Cardiac Enzymes: No results for input(s): "CKTOTAL", "CKMB", "CKMBINDEX", "TROPONINI" in the last 168 hours. BNP (last 3 results) No results for input(s): "PROBNP" in the last 8760 hours. HbA1C: No results for input(s): "HGBA1C" in the last 72 hours. CBG: No results for input(s): "GLUCAP" in the last 168 hours. Lipid Profile: No results for input(s): "CHOL", "HDL", "LDLCALC", "TRIG", "CHOLHDL", "LDLDIRECT" in the last 72 hours. Thyroid Function Tests: No results for input(s): "TSH", "T4TOTAL", "FREET4", "T3FREE", "THYROIDAB" in the last 72 hours. Anemia Panel: No results for input(s): "VITAMINB12", "FOLATE", "FERRITIN", "TIBC", "IRON", "RETICCTPCT" in the last 72 hours. Urine analysis:    Component Value Date/Time   COLORURINE STRAW (A) 11/16/2021 2035   APPEARANCEUR CLEAR (A) 11/16/2021 2035   LABSPEC 1.008 11/16/2021 2035   PHURINE 5.0 11/16/2021 2035   GLUCOSEU NEGATIVE 11/16/2021 2035   GLUCOSEU NEGATIVE 04/04/2018 1255   HGBUR NEGATIVE 11/16/2021 2035   BILIRUBINUR NEGATIVE 11/16/2021  [place] 11/16/2021 2035   PROTEINUR NEGATIVE 11/16/2021 2035   UROBILINOGEN 0.2 04/04/2018 1255   NITRITE NEGATIVE 11/16/2021 2035   LEUKOCYTESUR MODERATE (A) 11/16/2021 2035    Radiological Exams on Admission: DG Chest Port 1 View  Result Date: 11/16/2021 CLINICAL DATA:  Shortness of breath. EXAM: PORTABLE CHEST 1 VIEW COMPARISON:  April 16, 2020. FINDINGS: Stable cardiomegaly with  mild central pulmonary vascular congestion. Probable mild bilateral perihilar and basilar pulmonary edema is noted. Small pleural effusions are noted. Bony thorax is unremarkable. IMPRESSION: Stable cardiomegaly with mild central pulmonary vascular congestion and probable mild bilateral pulmonary edema. Small pleural effusions. Electronically Signed   By: Marijo Conception M.D.   On: 11/16/2021 20:37     Data Reviewed: Relevant notes from primary care and specialist visits, past discharge summaries as available in EHR, including Care Everywhere. Prior diagnostic testing as pertinent to current admission diagnoses Updated medications and problem lists for reconciliation ED course, including vitals, labs, imaging, treatment and response to treatment Triage notes, nursing and pharmacy notes and ED provider's notes Notable results as noted in HPI   Assessment and Plan: * Acute on chronic diastolic CHF (congestive heart failure) ([place]) Potential triggers include A-fib not previously diagnosed, symptomatic anemia, possible pneumonia, possible ACS IV Lasix and continue metoprolol and losartan Continue to trend troponins.  Treat potential etiologies Hold amlodipine Daily weights with intake and output monitoring Echocardiogram   Acute respiratory failure with hypoxia (HCC) Secondary to CHF O2 sat was in the low 80s with EMS requiring 6 L Continue supplemental oxygen and wean as tolerated Treat acute etiologies  Acute anemia Hemoglobin of 10.5, down from 13.5 a month ago Anemia panel and stool for occult blood  New onset atrial fibrillation (HCC) Rate controlled Continue metoprolol 3 CHADS2 Vascor of 3 but will hold off on anticoagulation due to new anemia  Elevated temperature Possible respiratory tract infection and shortness of breath versus urinary tract infection given abnormal UA Continue ceftriaxone and azithromycin for now Follow urine cultures  AKI (acute kidney injury)  ([place]) Creatinine 1.47 up from baseline of 1.01, suspect secondary to Lasix uptitration Continue to monitor and avoid nephrotoxins  Chronic back pain Continue oxycodone and gabapentin  Hypertension Blood pressure slightly elevated Continue metoprolol and losartan        DVT prophylaxis: Lovenox  Consults: Cardiology,[facility]  Advance Care Planning:   Code Status: Prior   Family Communication: Daughter [name] at bedside  Disposition Plan: Back to previous home environment  Severity of Illness: The appropriate patient status for this patient is INPATIENT. Inpatient status is judged to be reasonable and necessary in order to provide the required intensity of service to ensure the patient's safety. The patient's presenting symptoms, physical exam findings, and initial radiographic and laboratory data in the context of their chronic comorbidities is felt to place them at high risk for further clinical deterioration. Furthermore, it is not anticipated that the patient will be medically stable for discharge from the hospital within 2 midnights of admission.   * I certify that at the point of admission it is my clinical judgment that the patient will require inpatient hospital care spanning beyond 2 midnights from the point of admission due to high intensity of service, high risk for further deterioration and high frequency of surveillance required.*  Author: Athena Masse, MD 11/16/2021 11:37 PM  For on call review [email or web address].

## 2021-11-16 NOTE — Sepsis Progress Note (Signed)
Following per sepsis protocol   

## 2021-11-16 NOTE — Assessment & Plan Note (Signed)
Secondary to CHF O2 sat was in the low 80s with EMS requiring 6 L Continue supplemental oxygen and wean as tolerated Treat acute etiologies

## 2021-11-16 NOTE — Assessment & Plan Note (Signed)
Continue oxycodone and gabapentin

## 2021-11-16 NOTE — Assessment & Plan Note (Signed)
Rate controlled Continue metoprolol 3 CHADS2 Vascor of 3 but will hold off on anticoagulation due to new anemia

## 2021-11-16 NOTE — Assessment & Plan Note (Signed)
Possible respiratory tract infection and shortness of breath versus urinary tract infection given abnormal UA Continue ceftriaxone and azithromycin for now Follow urine cultures

## 2021-11-16 NOTE — Assessment & Plan Note (Signed)
Hemoglobin of 10.5, down from 13.5 a month ago Anemia panel and stool for occult blood

## 2021-11-16 NOTE — Assessment & Plan Note (Signed)
Creatinine 1.47 up from baseline of 1.01, suspect secondary to Lasix uptitration Continue to monitor and avoid nephrotoxins

## 2021-11-16 NOTE — Assessment & Plan Note (Signed)
Blood pressure slightly elevated Continue metoprolol and losartan

## 2021-11-16 NOTE — ED Triage Notes (Signed)
Pt to the ED via EMS from home C/O SOB over the last 3 days. Pt is on 4 L [place] but does not wear oxygen at home. Pt has bilateral edema and a history of CHF with a new onset of A-Fib per EMS. Pt is presenting as weak with slightly decreased LOC.

## 2021-11-17 ENCOUNTER — Encounter: Payer: Self-pay | Admitting: Oncology

## 2021-11-17 ENCOUNTER — Telehealth (HOSPITAL_COMMUNITY): Payer: Self-pay

## 2021-11-17 ENCOUNTER — Encounter: Payer: Self-pay | Admitting: Internal Medicine

## 2021-11-17 ENCOUNTER — Inpatient Hospital Stay
Admit: 2021-11-17 | Discharge: 2021-11-17 | Disposition: A | Discharge status: Home / Self Care | Payer: Medicare HMO | Attending: Internal Medicine | Admitting: Internal Medicine

## 2021-11-17 ENCOUNTER — Other Ambulatory Visit: Payer: Self-pay

## 2021-11-17 ENCOUNTER — Other Ambulatory Visit (HOSPITAL_COMMUNITY): Payer: Self-pay

## 2021-11-17 DIAGNOSIS — I5033 Acute on chronic diastolic (congestive) heart failure: Secondary | ICD-10-CM | POA: Diagnosis not present

## 2021-11-17 LAB — RESPIRATORY PANEL BY PCR

## 2021-11-17 LAB — RETICULOCYTES
Immature Retic Fract: 14.7 % (ref 2.3–15.9)
RBC.: 3.29 MIL/uL — ABNORMAL LOW (ref 3.87–5.11)
Retic Count, Absolute: 64.2 10*3/uL (ref 19.0–186.0)
Retic Ct Pct: 2 % (ref 0.4–3.1)

## 2021-11-17 LAB — IRON AND TIBC
Iron: 15 ug/dL — ABNORMAL LOW (ref 28–170)
Saturation Ratios: 7 % — ABNORMAL LOW (ref 10.4–31.8)
TIBC: 207 ug/dL — ABNORMAL LOW (ref 250–450)
UIBC: 192 ug/dL

## 2021-11-17 LAB — ECHOCARDIOGRAM COMPLETE
AR max vel: 2.32 cm2
AV Area VTI: 2.54 cm2
AV Area mean vel: 2.37 cm2
AV Mean grad: 4 mmHg
AV Peak grad: 8.1 mmHg
Ao pk vel: 1.42 m/s
Area-P 1/2: 4.17 cm2
Height: 64 in
S' Lateral: 2.6 cm
Weight: 2560 oz

## 2021-11-17 LAB — BASIC METABOLIC PANEL
Anion gap: 11 (ref 5–15)
BUN: 34 mg/dL — ABNORMAL HIGH (ref 8–23)
CO2: 26 mmol/L (ref 22–32)
Calcium: 8.9 mg/dL (ref 8.9–10.3)
Chloride: 101 mmol/L (ref 98–111)
Creatinine, Ser: 1.43 mg/dL — ABNORMAL HIGH (ref 0.44–1.00)
GFR, Estimated: 35 mL/min — ABNORMAL LOW (ref >60)
Glucose, Bld: 143 mg/dL — ABNORMAL HIGH (ref 70–99)
Potassium: 3.8 mmol/L (ref 3.5–5.1)
Sodium: 138 mmol/L (ref 135–145)

## 2021-11-17 LAB — LACTIC ACID, PLASMA: Lactic Acid, Venous: 1 mmol/L (ref 0.5–1.9)

## 2021-11-17 LAB — CBC
HCT: 32.3 % — ABNORMAL LOW (ref 36.0–46.0)
Hemoglobin: 10.5 g/dL — ABNORMAL LOW (ref 12.0–15.0)
MCH: 32.1 pg (ref 26.0–34.0)
MCHC: 32.5 g/dL (ref 30.0–36.0)
MCV: 98.8 fL (ref 80.0–100.0)
Platelets: 315 10*3/uL (ref 150–400)
RBC: 3.27 MIL/uL — ABNORMAL LOW (ref 3.87–5.11)
RDW: 13.3 % (ref 11.5–15.5)
WBC: 9.7 10*3/uL (ref 4.0–10.5)
nRBC: 0 % (ref 0.0–0.2)

## 2021-11-17 LAB — FERRITIN: Ferritin: 259 ng/mL (ref 11–307)

## 2021-11-17 LAB — TROPONIN I (HIGH SENSITIVITY)
Troponin I (High Sensitivity): 15 ng/L (ref <18)
Troponin I (High Sensitivity): 17 ng/L (ref <18)

## 2021-11-17 LAB — FOLATE: Folate: 8.8 ng/mL (ref >5.9)

## 2021-11-17 MED ORDER — CALCIUM CARBONATE 1250 (500 CA) MG PO TABS
1.0000 | ORAL_TABLET | Freq: Two times a day (BID) | ORAL | Status: DC
  Administered 2021-11-17 – 2021-11-20 (×6): 1250 mg via ORAL
  Filled 2021-11-17 (×7): qty 1

## 2021-11-17 MED ORDER — ACETAMINOPHEN 325 MG PO TABS: 650.0000 mg | ORAL_TABLET | Freq: Four times a day (QID) | ORAL | Status: DC | PRN

## 2021-11-17 MED ORDER — FUROSEMIDE 10 MG/ML IJ SOLN
40.0000 mg | Freq: Two times a day (BID) | INTRAMUSCULAR | Status: AC
  Administered 2021-11-17 – 2021-11-19 (×5): 40 mg via INTRAVENOUS
  Filled 2021-11-17 (×5): qty 4

## 2021-11-17 MED ORDER — APIXABAN 5 MG PO TABS
5.0000 mg | ORAL_TABLET | Freq: Two times a day (BID) | ORAL | Status: DC
  Administered 2021-11-17 – 2021-11-20 (×6): 5 mg via ORAL
  Filled 2021-11-17 (×6): qty 1

## 2021-11-17 MED ORDER — TRAMADOL HCL 50 MG PO TABS: 50.0000 mg | ORAL_TABLET | Freq: Every evening | ORAL | Status: DC | PRN

## 2021-11-17 MED ORDER — INFLUENZA VAC A&B SA ADJ QUAD 0.5 ML IM PRSY: 0.5000 mL | PREFILLED_SYRINGE | INTRAMUSCULAR | Status: DC

## 2021-11-17 MED ORDER — ACETAMINOPHEN 650 MG RE SUPP: 650.0000 mg | Freq: Four times a day (QID) | RECTAL | Status: DC | PRN

## 2021-11-17 MED ORDER — ORAL CARE MOUTH RINSE: 15.0000 mL | OROMUCOSAL | Status: DC | PRN

## 2021-11-17 MED ORDER — OXYCODONE HCL 5 MG PO TABS
5.0000 mg | ORAL_TABLET | ORAL | Status: DC | PRN
  Administered 2021-11-17 – 2021-11-20 (×7): 5 mg via ORAL
  Filled 2021-11-17 (×7): qty 1

## 2021-11-17 MED ORDER — GABAPENTIN 100 MG PO CAPS
200.0000 mg | ORAL_CAPSULE | Freq: Every day | ORAL | Status: DC
  Administered 2021-11-17 – 2021-11-19 (×3): 200 mg via ORAL
  Filled 2021-11-17 (×3): qty 2

## 2021-11-17 MED ORDER — TRAMADOL HCL 50 MG PO TABS
50.0000 mg | ORAL_TABLET | Freq: Once | ORAL | Status: AC
  Administered 2021-11-17: 50 mg via ORAL
  Filled 2021-11-17: qty 1

## 2021-11-17 MED ORDER — ALBUTEROL SULFATE (2.5 MG/3ML) 0.083% IN NEBU: 2.5000 mg | INHALATION_SOLUTION | RESPIRATORY_TRACT | Status: DC | PRN

## 2021-11-17 NOTE — Telephone Encounter (Signed)
Pharmacy Patient Advocate Encounter  Insurance verification completed.    The patient is insured through [employer].    The patient is currently admitted and ran test claims for the following: Eliquis '5mg'$ .  Copays and coinsurance results were relayed to Inpatient clinical team.

## 2021-11-17 NOTE — TOC Benefit Eligibility Note (Signed)
Patient Teacher, English as a foreign language completed.    The current 30 day co-pay is, $45.00 for Eliquis '5mg'$ .   The patient is insured through [employer].   Otis Brace, [place] Patient Advocate Specialist [place] Patient Advocate Team Direct Number: [phone]  Fax: [phone]

## 2021-11-17 NOTE — IPAL (Signed)
  Interdisciplinary Goals of Care Family Meeting   Date carried out: 11/17/2021  Location of the meeting: Bedside  Member's involved: Physician and Family Member or next of kin  Durable Power of Attorney or acting medical decision maker: Daughter and POA [name] Engineer, building services   Discussion: We discussed goals of care for [employer] .    Code status: Full DNR  Disposition: Continue current acute care  Time spent for the meeting: [place], MD  11/17/2021, 1:29 AM

## 2021-11-17 NOTE — Progress Notes (Addendum)
Nutrition Brief Note  RD pulled to chart secondary to CHF.   Wt Readings from Last 15 Encounters:  11/16/21 72.6 kg  09/24/21 69.4 kg  09/21/21 69.4 kg  11/30/20 70.8 kg  11/10/20 70.8 kg  07/27/20 69.4 kg  05/12/20 74.4 kg  04/16/20 71.7 kg  02/20/20 71.7 kg  01/31/20 71.7 kg  12/24/19 73.9 kg  11/26/19 69.9 kg  10/29/19 69.9 kg  06/25/19 69.9 kg  06/11/19 69.7 kg   Pt with medical history significant for Chronic diastolic heart failure, hypertension, PAD, chronic back pain, who presents with a several day history of dyspnea on exertion.    Pt admitted with CHF.  RD provided "Low Sodium Nutrition Therapy" handout from [facility] Nutrition Care Manual; attached to AVS/ discharge summary.   Reviewed wt hx; wt has been stable over the past year.   Medications reviewed and include lasix.   Current diet order is Heart Healthy (liberalized to 2 gram sodium), patient is consuming approximately n/a% of meals at this time. Labs and medications reviewed.   No nutrition interventions warranted at this time. If nutrition issues arise, please consult RD.   [name], RD, LDN, [place] Registered Dietitian II Certified Diabetes Care and Education Specialist Please refer to [facility] for RD and/or RD on-call/weekend/after hours pager

## 2021-11-17 NOTE — Consult Note (Signed)
   Heart Failure Nurse Navigator Note  HFpEF 50-55%.  Mild LVH.  Mild to moderate mitral regurgitation.  Mild to moderate tricuspid regurgitation.  She presented to the emergency room with complaints of worsening shortness of breath, dyspnea on exertion and lower extremity edema.  She is also found to be in atrial fibrillation which is a new finding for her.  BNP was 600.  Chest x-ray revealed pulmonary vascular congestion and pulmonary edema.  Comorbidities:  Anemia Hyperlipidemia Hypertension Osteoarthritis Peripheral arterial disease  Medications:  Eliquis 5 mg twice daily  irbesartan 300 mg daily Metoprolol succinate 25 mg twice a day Simvastatin 10 mg at 1800 Furosemide 40 mg IV twice a day  Labs:  Sodium 138, potassium 3.8, chloride 101, CO2 26, BUN 34, creatinine 1.43, magnesium 2, hemoglobin 10.5, hematocrit 32, BNP 600, iron 15, TIBC 207, saturation ratio 7, ferritin 259. Weight is 72.6 kg Blood pressure 119/104   Initial meeting with patient, in the emergency room she is lying on a gurney currently on O2 per nasal cannula.  She states that she is having a lot of arthritis pain in her left knee and is asked for something to relieve the discomfort.  There is currently no family members at the bedside.  With her being in pain I told her that I would gladly come back and talk to her at another time but she states no that she would like to talk with me at this time.  Discussed what heart failure is and she states she has heard the term before but not in relationship to herself.  Explained the results of her echocardiogram.  States that her husband is the one that prepares the meals at home and they do not use salt.  She states that she is not sure how much of a fluid intake that she has but we discussed the parameters which were 64 ounces or eight 8 ounce cups in a days time.   States that she has not weighed herself for at least 8 weeks as she fell at that time and is  afraid of falling again.  Discussed the outpatient heart failure clinic and made aware that she has an appointment on October 12 at 3 PM.  She has a 17% no-show which is 1 out of 83 appointments.  She was given the living with heart failure teaching booklet, zone magnet, info on low-sodium and heart failure along with weight chart.  She had no further questions at this time.  I told her I would continue to follow along as long as she is remaining in the hospital.  [name] RN [facility]

## 2021-11-17 NOTE — Progress Notes (Signed)
       CROSS COVER NOTE  NAME: [name] MRN: [id number] DOB : [birth year]    Date of Service   11/17/2021   HPI/Events of Note   Medication request received for home Tramadol for patient report of 7/10 chronic hip pain not relieved by oxycodone. PDMP checked.  Interventions   Assessment/Plan:  Home Tramadol x1      This document was prepared using Dragon voice recognition software and may include unintentional dictation errors.  Neomia Glass DNP, MHA, FNP-BC Nurse Practitioner [facility] Pager [phone]

## 2021-11-17 NOTE — Progress Notes (Signed)
[place] [facility] [place] NAME: [name]    MR#:  [id number]  DATE OF BIRTH:  May 21, 1928  SUBJECTIVE:      VITALS:  Blood pressure (!) 169/79, pulse 83, temperature 98.4 F (36.9 C), temperature source Oral, resp. rate (!) 23, height '5\' 4"'$  (1.626 m), weight 72.6 kg, SpO2 96 %.  PHYSICAL EXAMINATION:   GENERAL:  88 y.o.-year-old patient lying in the bed with no acute distress.  LUNGS: Normal breath sounds bilaterally, no wheezing, rales, rhonchi.  CARDIOVASCULAR: S1, S2 normal. No murmurs, rubs, or gallops.  ABDOMEN: Soft, nontender, nondistended. Bowel sounds present.  EXTREMITIES: No  edema b/l.    NEUROLOGIC: nonfocal  patient is alert and awake SKIN: No obvious rash, lesion, or ulcer.   LABORATORY PANEL:  CBC Recent Labs  Lab 11/17/21 0356  WBC 9.7  HGB 10.5*  HCT 32.3*  PLT 315    Chemistries  Recent Labs  Lab 11/16/21 2036 11/16/21 2140 11/17/21 0356  NA 134*  --  138  K 3.9  --  3.8  CL 100  --  101  CO2 24  --  26  GLUCOSE 149*  --  143*  BUN 37*  --  34*  CREATININE 1.47*  --  1.43*  CALCIUM 8.7*  --  8.9  MG  --  2.0  --   AST 13*  --   --   ALT 9  --   --   ALKPHOS 47  --   --   BILITOT 1.5*  --   --    Cardiac Enzymes No results for input(s): "TROPONINI" in the last 168 hours. RADIOLOGY:  ECHOCARDIOGRAM COMPLETE  Result Date: 11/17/2021    ECHOCARDIOGRAM REPORT   Patient Name:   [name] [facility] Date of Exam: 11/17/2021 Medical Rec #:  [id number]     Height:       64.0 in Accession #:    [id number]    Weight:       160.0 lb Date of Birth:  1929-01-08    BSA:          1.779 m Patient Age:    83 years      BP:           172/86 mmHg Patient Gender: F             HR:           80 bpm. Exam Location:  [facility] Procedure: 2D Echo, Cardiac Doppler and Color Doppler Indications:     CHF-acute diastolic Y19.50  History:         Patient has prior history of Echocardiogram examinations, most                  recent  04/23/2020. Risk Factors:Hypertension.  Sonographer:     Sherrie Sport Referring Phys:  [id number] Athena Masse Diagnosing Phys: Katrina Cowman MD IMPRESSIONS  1. Left ventricular ejection fraction, by estimation, is 50 to 55%. The left ventricle has low normal function. The left ventricle has no regional wall motion abnormalities. There is mild left ventricular hypertrophy. Left ventricular diastolic parameters were normal.  2. Right ventricular systolic function is normal. The right ventricular size is normal.  3. The mitral valve is normal in structure. Mild to moderate mitral valve regurgitation. No evidence of mitral stenosis.  4. Tricuspid valve regurgitation is mild to moderate.  5. The aortic valve is normal in structure. Aortic valve regurgitation  is mild to moderate. No aortic stenosis is present.  6. The inferior vena cava is normal in size with greater than 50% respiratory variability, suggesting right atrial pressure of 3 mmHg. FINDINGS  Left Ventricle: Left ventricular ejection fraction, by estimation, is 50 to 55%. The left ventricle has low normal function. The left ventricle has no regional wall motion abnormalities. The left ventricular internal cavity size was normal in size. There is mild left ventricular hypertrophy. Left ventricular diastolic parameters were normal. Right Ventricle: The right ventricular size is normal. No increase in right ventricular wall thickness. Right ventricular systolic function is normal. Left Atrium: Left atrial size was normal in size. Right Atrium: Right atrial size was normal in size. Pericardium: There is no evidence of pericardial effusion. Mitral Valve: The mitral valve is normal in structure. Mild to moderate mitral valve regurgitation. No evidence of mitral valve stenosis. Tricuspid Valve: The tricuspid valve is normal in structure. Tricuspid valve regurgitation is mild to moderate. No evidence of tricuspid stenosis. Aortic Valve: The aortic valve is normal in  structure. Aortic valve regurgitation is mild to moderate. No aortic stenosis is present. Aortic valve mean gradient measures 4.0 mmHg. Aortic valve peak gradient measures 8.1 mmHg. Aortic valve area, by VTI measures 2.54 cm. Pulmonic Valve: The pulmonic valve was normal in structure. Pulmonic valve regurgitation is not visualized. No evidence of pulmonic stenosis. Aorta: The aortic root is normal in size and structure. Venous: The inferior vena cava is normal in size with greater than 50% respiratory variability, suggesting right atrial pressure of 3 mmHg. IAS/Shunts: No atrial level shunt detected by color flow Doppler.  LEFT VENTRICLE PLAX 2D LVIDd:         3.60 cm   Diastology LVIDs:         2.60 cm   LV e' medial:    18.10 cm/s LV PW:         1.20 cm   LV E/e' medial:  7.8 LV IVS:        1.20 cm   LV e' lateral:   6.96 cm/s LVOT diam:     2.00 cm   LV E/e' lateral: 20.3 LV SV:         65 LV SV Index:   37 LVOT Area:     3.14 cm  RIGHT VENTRICLE RV Basal diam:  2.80 cm RV Mid diam:    2.40 cm RV S prime:     13.90 cm/s TAPSE (M-mode): 3.4 cm LEFT ATRIUM             Index        RIGHT ATRIUM           Index LA diam:        4.30 cm 2.42 cm/m   RA Area:     12.00 cm LA Vol (A2C):   52.9 ml 29.73 ml/m  RA Volume:   22.50 ml  12.64 ml/m LA Vol (A4C):   52.7 ml 29.62 ml/m LA Biplane Vol: 55.2 ml 31.02 ml/m  AORTIC VALVE AV Area (Vmax):    2.32 cm AV Area (Vmean):   2.37 cm AV Area (VTI):     2.54 cm AV Vmax:           142.00 cm/s AV Vmean:          93.600 cm/s AV VTI:            0.256 m AV Peak Grad:      8.1 mmHg AV  Mean Grad:      4.0 mmHg LVOT Vmax:         105.00 cm/s LVOT Vmean:        70.700 cm/s LVOT VTI:          0.207 m LVOT/AV VTI ratio: 0.81  AORTA Ao Root diam: 2.70 cm MITRAL VALVE                TRICUSPID VALVE MV Area (PHT): 4.17 cm     TR Peak grad:   49.3 mmHg MV Decel Time: 182 msec     TR Vmax:        351.00 cm/s MV E velocity: 141.00 cm/s MV A velocity: 50.80 cm/s   SHUNTS MV E/A ratio:   2.78         Systemic VTI:  0.21 m                             Systemic Diam: 2.00 cm Katrina Cowman MD Electronically signed by Katrina Cowman MD Signature Date/Time: 11/17/2021/1:19:05 PM    Final    DG Chest Port 1 View  Result Date: 11/16/2021 CLINICAL DATA:  Shortness of breath. EXAM: PORTABLE CHEST 1 VIEW COMPARISON:  April 16, 2020. FINDINGS: Stable cardiomegaly with mild central pulmonary vascular congestion. Probable mild bilateral perihilar and basilar pulmonary edema is noted. Small pleural effusions are noted. Bony thorax is unremarkable. IMPRESSION: Stable cardiomegaly with mild central pulmonary vascular congestion and probable mild bilateral pulmonary edema. Small pleural effusions. Electronically Signed   By: Marijo Conception M.D.   On: 11/16/2021 20:37    Assessment and Plan [name] is a 88 y.o. female with medical history significant for Chronic diastolic heart failure, hypertension, PAD, chronic back pain, who presents to the ED with a several day history of dyspnea on exertion.  Patient has a several month history of lower extremity edema, and has been on increasing doses of Lasix as an outpatient, however her symptoms have not improved and continues to have worsening shortness of breath.    [name] is a 88 y.o. female with medical history significant for Chronic diastolic heart failure, hypertension, PAD, chronic back pain, who presents to the ED with a several day history of dyspnea on exertion.  Patient has a several month history of lower extremity edema, and has been on increasing doses of Lasix as an outpatient, however her symptoms have not improved and continues to have worsening shortness of breath  Chest x-ray consistent with CHF. No fever or productive cough at home. Patient does not have dysuria frequency urgency. This was confirmed with patient's daughter.  Acute on chronic diastolic CHF (congestive heart failure) ([place]) --Potential triggers include  A-fib not previously diagnosed, symptomatic anemia --IV Lasix and continue metoprolol and losartan --Continue to trend troponins.   --Hold amlodipine --Daily weights with intake and output monitoring --cont BB, irbesartan   Acute respiratory failure with hypoxia (HCC) --Secondary to CHF -O2 sat was in the low 80s with EMS requiring 6 L-->2 liters --Continue supplemental oxygen and wean as tolerated   Acute anemia Hemoglobin of 10.5, down from 13.5 a month ago Per dter S iron has been followed by PCP dr Nicki Reaper. Last month s iron was 153 --s iron on higher side as out pt labs --I will hold off on any further w/u and have iron studies be done as out pt by dr Nicki Reaper  New onset atrial fibrillation (  [place]) --Rate controlled --Continue metoprolol --started on  eliquis by cardiology after d/w dters  Elevated temperature --pt afebrile and wbc normal --no dysuria--d/c abxs --no PNA on CXR --BC neg   AKI (acute kidney injury) ([place]) Creatinine 1.47 up from baseline of 1.01, suspect secondary to Lasix uptitration Continue to monitor and avoid nephrotoxins   Chronic back pain Continue oxycodone and gabapentin   Hypertension Blood pressure slightly elevated Continue metoprolol and losartan               DVT prophylaxis: eliquis   Consults: Cardiology,[facility]   Advance Care Planning:   Code Status: DNR   Family Communication: Daughter [name] pn phone  Level of care: Progressive Status is: Inpatient Remains inpatient appropriate because: chf    TOTAL TIME TAKING CARE OF THIS PATIENT: 40 minutes.  >50% time spent on counselling and coordination of care  Note: This dictation was prepared with Dragon dictation along with smaller phrase technology. Any transcriptional errors that result from this process are unintentional.  Fritzi Mandes M.D    [facility]   CC: Primary care physician; [name], MD

## 2021-11-17 NOTE — ED Notes (Addendum)
Charted in error.

## 2021-11-17 NOTE — Consult Note (Signed)
[place] NOTE       Patient ID: OREE MIRELEZ MRN: [id number] DOB/AGE: 1928-06-04 88 y.o.  Admit date: 11/16/2021 Referring Physician Dr. Judd Gaudier  Primary Physician Dr. Walker Shadow  Primary Cardiologist Dr. Saralyn Pilar Reason for Consultation CHF, atrial fibrillation  HPI: [name] is a 88 year old female with a past medical history of HFpEF (LVEF 60-65%, G2 DD, no RWMA's, mild LVH 04/2020), hypertension, hereditary hemochromatosis CKD 3, colon cancer s/p resection x2 and chemotherapy who presented to [facility] ED 11/16/2021 with shortness of breath x3 days worth with ambulation and associated generalized weakness.  She was found to be in atrial fibrillation by EMS which appears to be new in onset.  Cardiology is consulted for further assistance.  She presents with her 2 daughters who provide most of the history.  Her daughter states she fell on and 1/2 months ago hit her head, presented to the ED and everything checked out okay, but she was really shaken after that fall and stopped walking as much as she used to despite encouragement.  She continues to ambulate short distances with a walker in her home, but is overall less mobile than she used to be.  Over the past week she has had worsening leg swelling and on Friday evening (4 days ago) her daughters noticed her breathing is very labored, they increased her Lasix to 40 mg once a day for 3 days (home dose was 20 once a day) with improvement in her peripheral edema.  However, on Tuesday evening her husband noted her to be wheezing and very short of breath, so they called EMS for further assistance.  Her daughters note she drinks maybe 24 to 32 ounces of water per day, eats dessert at lunchtime but denies lots of salty foods or adding salt to her meals.  At my time of evaluation, the patient says she feels a little better than when she came in but not quite back to baseline.  She denies any chest pain, palpitations,  dizziness, shortness of breath.  Per her daughters, her lower extremity swelling is better than what it was.  She was given 40 mg of IV Lasix x1 and has been weaned from 6 L down to 2 L of oxygen by nasal cannula overnight.  Recent vitals are notable for a blood pressure of 150/58, SPO2 91% on 2 L by nasal cannula, heart rate in the 70s to 80s in atrial fibrillation on telemetry.  Labs are notable for sodium initially 134, potassium today 3.8, magnesium 2.0, slightly improving AKI overnight with current BUN/creatinine 34/1.43 and GFR 35.  BNP elevated at 600, high-sensitivity troponin negative at 15-17.  Iron levels low at 15, TIBC low at 207.  Procalcitonin minimally elevated at 0.15.  H&H stable at 10.5/32.3, platelets 315.  Respiratory panel negative for influenza or COVID, 20 pathogen respiratory panel negative.  UA with moderate LE and rare bacteria with culture pending.  Chest x-ray with stable cardiomegaly and mild central pulmonary vascular congestion probable mild bilateral pulmonary edema small pleural effusions.   Review of systems complete and found to be negative unless listed above     Past Medical History:  Diagnosis Date   Anemia    Barrett's esophagus    BCC (basal cell carcinoma of skin) 03/07/2013   vertex scalp - NODULAR NODULAR PATTERN PATTERN   Bowel obstruction (HCC)    s/p adhesion resection   Chronic back pain    Colon cancer ([place]) 1988 and 1989  adenocarcinoma, s/p resection x  2 and chemo   Diverticulitis    GERD (gastroesophageal reflux disease)    H/O open leg wound    Hiatal hernia    Hypercholesteremia    Hypertension    Lumbar scoliosis    OA (osteoarthritis)    Peripheral neuropathy    Recurrent sinus infections    Renal cyst    S/P chemotherapy, time since greater than 12 weeks    colon cancer   Vertigo     Past Surgical History:  Procedure Laterality Date   ABDOMINAL HYSTERECTOMY  1982   adhesions resected     bowel obstruction    APPENDECTOMY     BREAST BIOPSY Left    negative 06/14/1985   CHOLECYSTECTOMY  1994   COLON RESECTION     x2.  s/p colon cancer   COLON SURGERY  1958 and 1989   For Colon Cancer   EXCISIONAL HEMORRHOIDECTOMY     with tubal ligation   EXPLORATORY LAPAROTOMY  1991   Secondary to SBO    (Not in a hospital admission)  Social History   Socioeconomic History   Marital status: Married    Spouse name: Not on file   Number of children: 4   Years of education: 12th grade   Highest education level: Not on file  Occupational History   Occupation: homemaker  Tobacco Use   Smoking status: Never   Smokeless tobacco: Never  Vaping Use   Vaping Use: Never used  Substance and Sexual Activity   Alcohol use: No    Alcohol/week: 0.0 standard drinks of alcohol   Drug use: No   Sexual activity: Not Currently  Other Topics Concern   Not on file  Social History Narrative   Not on file   Social Determinants of Health   Financial Resource Strain: Low Risk  (11/30/2020)   Overall Financial Resource Strain (CARDIA)    Difficulty of Paying Living Expenses: Not hard at all  Food Insecurity: No Food Insecurity (11/26/2019)   Hunger Vital Sign    Worried About Estate manager/land agent of Food in the Last Year: Never true    Ran Out of Food in the Last Year: Never true  Transportation Needs: No Transportation Needs (11/26/2019)   PRAPARE - Hydrologist (Medical): No    Lack of Transportation (Non-Medical): No  Physical Activity: Unknown (11/30/2020)   Exercise Vital Sign    Days of Exercise per Week: 0 days    Minutes of Exercise per Session: Not on file  Stress: No Stress Concern Present (11/30/2020)   [place]    Feeling of Stress : Not at all  Social Connections: Unknown (11/30/2020)   Social Connection and Isolation Panel [NHANES]    Frequency of Communication with Friends and Family: More than three times  a week    Frequency of Social Gatherings with Friends and Family: More than three times a week    Attends Religious Services: Not on file    Active Member of Clubs or Organizations: Not on file    Attends Archivist Meetings: Not on file    Marital Status: Married  Intimate Partner Violence: Not At Risk (11/30/2020)   Humiliation, Afraid, Rape, and Kick questionnaire    Fear of Current or Ex-Partner: No    Emotionally Abused: No    Physically Abused: No    Sexually Abused: No    Family History  Problem Relation Age of Onset   Breast cancer Mother    Asthma Mother    Stroke Father    Hypertension Father    Diabetes Father    Ovarian cancer Sister        x2   Prostate cancer Brother    Hypertension Brother        x3   Heart disease Brother    Hypercholesterolemia Brother        x3   Diabetes Brother    Spina bifida Grandchild    Hematuria Neg Hx    Kidney cancer Neg Hx    Kidney disease Neg Hx    Sickle cell trait Neg Hx    Tuberculosis Neg Hx      Vitals:   11/17/21 0400 11/17/21 0430 11/17/21 0512 11/17/21 0600  BP: (!) 104/93 (!) 130/53 (!) 150/65 (!) 150/58  Pulse: 79 78 81 91  Resp: (!) '22 16 20 '$ (!) 21  Temp: 98.9 F (37.2 C)     TempSrc: Oral     SpO2: 98% 95% 96% 98%  Weight:      Height:        PHYSICAL EXAM General: Pleasant elderly Caucasian female, well nourished, in no acute distress.  Sitting upright in ED stretcher with her 2 daughters at bedside HEENT:  Normocephalic and atraumatic. Neck:  No JVD.  Lungs: Normal respiratory effort on 2 L by nasal cannula.  Decreased breath sounds with trace bibasilar crackles.   Heart: Irregularly irregular with controlled rate. Normal S1 and S2 without gallops or murmurs.  Abdomen: Non-distended appearing.  Msk: Normal strength and tone for age. Extremities: Warm and well perfused. No clubbing, cyanosis.  2+ bilateral lower extremity edema in her thighs.  Compression hose in place..  Neuro: Alert and  oriented X 3. Psych:  Answers questions appropriately.   Labs: Basic Metabolic Panel: Recent Labs    11/16/21 2036 11/16/21 2140 11/17/21 0356  NA 134*  --  138  K 3.9  --  3.8  CL 100  --  101  CO2 24  --  26  GLUCOSE 149*  --  143*  BUN 37*  --  34*  CREATININE 1.47*  --  1.43*  CALCIUM 8.7*  --  8.9  MG  --  2.0  --    Liver Function Tests: Recent Labs    11/16/21 2036  AST 13*  ALT 9  ALKPHOS 47  BILITOT 1.5*  PROT 6.5  ALBUMIN 3.2*   No results for input(s): "LIPASE", "AMYLASE" in the last 72 hours. CBC: Recent Labs    11/16/21 2036 11/17/21 0356  WBC 9.9 9.7  NEUTROABS 7.9*  --   HGB 10.5* 10.5*  HCT 33.8* 32.3*  MCV 102.1* 98.8  PLT 283 315   Cardiac Enzymes: Recent Labs    11/16/21 2338 11/17/21 0356  TROPONINIHS 15 17   BNP: Recent Labs    11/16/21 2036  BNP 600.3*   D-Dimer: No results for input(s): "DDIMER" in the last 72 hours. Hemoglobin A1C: No results for input(s): "HGBA1C" in the last 72 hours. Fasting Lipid Panel: No results for input(s): "CHOL", "HDL", "LDLCALC", "TRIG", "CHOLHDL", "LDLDIRECT" in the last 72 hours. Thyroid Function Tests: No results for input(s): "TSH", "T4TOTAL", "T3FREE", "THYROIDAB" in the last 72 hours.  Invalid input(s): "FREET3" Anemia Panel: Recent Labs    11/16/21 2338 11/17/21 0356  FOLATE 8.8  --   FERRITIN 259  --   TIBC 207*  --  IRON 15*  --   RETICCTPCT  --  2.0     Radiology: DG Chest Port 1 View  Result Date: 11/16/2021 CLINICAL DATA:  Shortness of breath. EXAM: PORTABLE CHEST 1 VIEW COMPARISON:  April 16, 2020. FINDINGS: Stable cardiomegaly with mild central pulmonary vascular congestion. Probable mild bilateral perihilar and basilar pulmonary edema is noted. Small pleural effusions are noted. Bony thorax is unremarkable. IMPRESSION: Stable cardiomegaly with mild central pulmonary vascular congestion and probable mild bilateral pulmonary edema. Small pleural effusions. Electronically  Signed   By: Marijo Conception M.D.   On: 11/16/2021 20:37    ECHO 04/23/2020  1. Left ventricular ejection fraction, by estimation, is 60 to 65%. The  left ventricle has normal function. The left ventricle has no regional  wall motion abnormalities. There is moderate left ventricular hypertrophy.  Left ventricular diastolic  parameters are consistent with Grade II diastolic dysfunction  (pseudonormalization). The average left ventricular global longitudinal  strain is -22.0 %. The global longitudinal strain is normal.   2. Right ventricular systolic function is normal. The right ventricular  size is normal. There is moderately elevated pulmonary artery systolic  pressure. The estimated right ventricular systolic pressure is 67.5 mmHg.   3. The mitral valve is normal in structure. Mild mitral valve  regurgitation.   4. Tricuspid valve regurgitation is mild to moderate.   TELEMETRY reviewed by me (LT) 11/17/2021 : atrial fibrillation 80s-90s  EKG reviewed by me: Atrial fibrillation rate 89 bpm  Data reviewed by me (LT) 11/17/2021: last cardiology note, ed note, admission H&P, cbc, bmp, troponins, bnp, last echo, ekgs, UA   Principal Problem:   Acute on chronic diastolic CHF (congestive heart failure) ([place]) Active Problems:   Hypertension   Chronic back pain   AKI (acute kidney injury) ([place])   New onset atrial fibrillation (HCC)   Acute anemia   Acute respiratory failure with hypoxia ([place])   Elevated temperature    ASSESSMENT AND PLAN:  [name] is a 88 year old female with a past medical history of HFpEF (LVEF 60-65%, G2 DD, no RWMA's, mild LVH 04/2020), hypertension, hereditary hemochromatosis CKD 3, colon cancer s/p resection x2 and chemotherapy who presented to [facility] ED 11/16/2021 with shortness of breath x3 days worth with ambulation and associated generalized weakness.  She was found to be in atrial fibrillation by EMS which appears to be new in onset.  Cardiology is consulted  for further assistance.  #Acute cystitis With moderate leukocyte esterase and bacterial growth on UA, culture pending.  Temperature elevated at 100.2 on presentation.  Started on empiric azithromycin and ceftriaxone on admission.  Management per primary team.  #Acute on chronic HFpEF (EF 60-65%, G2 DD, mild LVH 04/2020) Presents with 4 days of worsening shortness of breath and peripheral edema, refractory to doubling her Lasix dose to 40 mg once a day for 3 days.  Denies increased salt or water intake (daughter states she drinks about 32 ounces total per day).  BNP is elevated to 600 and she is clinically volume overloaded on exam with 2+ pitting edema in her lower legs and an oxygen requirement initially 6 L by nasal cannula. -S/p 40 mg IV Lasix x2 with clinical improvement and weaning of her oxygen requirement. -Continue IV Lasix 40 mg twice daily for now, close monitoring of her renal function -Continue GDMT with metoprolol XL 25 mg twice a day, irbesartan 300 mg once daily -Repeat echo complete -Strict I's/O  #New onset paroxysmal atrial fibrillation Seen  on initial EKG, rate controlled in the 70s to 90s on telemetry so far.  Possibly contributing to her heart failure symptoms and volume overload. -Discussed the risks and benefits of starting anticoagulation for stroke risk reduction with Eliquis 5 mg twice a day.  Patient and her daughters are in agreement. -Continue metoprolol XL 25 mg twice daily -Continuous monitoring on telemetry  #AKI on CKD 3 Current creatinine 1.43 and GFR of 35. Baseline from 05/2021 was 1.2 and 42.  Monitor closely with diuresis.  This patient's plan of care was discussed and created with Dr. Saralyn Pilar and he is in agreement.  Signed: Tristan Schroeder , PA-C 11/17/2021, 8:22 AM [facility]

## 2021-11-17 NOTE — Progress Notes (Signed)
*  PRELIMINARY RESULTS* Echocardiogram 2D Echocardiogram has been performed.  [name] 11/17/2021, 12:21 PM

## 2021-11-17 NOTE — Discharge Instructions (Signed)

## 2021-11-17 NOTE — Progress Notes (Signed)
PHARMACIST - PHYSICIAN COMMUNICATION  CONCERNING:  Enoxaparin (Lovenox) for DVT Prophylaxis    RECOMMENDATION: Patient was prescribed enoxaprin '40mg'$  q24 hours for VTE prophylaxis.   Filed Weights   11/16/21 2007  Weight: 72.6 kg (160 lb)    Body mass index is 27.46 kg/m.  Estimated Creatinine Clearance: 24.9 mL/min (A) (by C-G formula based on SCr of 1.47 mg/dL (H)).  Patient is candidate for enoxaparin '30mg'$  every 24 hours based on CrCl <13m/min or Weight <45kg  DESCRIPTION: [facility]  Patient is now receiving enoxaparin 30 mg every 24 hours   NRenda Rolls PharmD, M[facility], [facility]11/17/2021 12:04 AM

## 2021-11-18 DIAGNOSIS — I5033 Acute on chronic diastolic (congestive) heart failure: Secondary | ICD-10-CM | POA: Diagnosis not present

## 2021-11-18 LAB — T4, FREE: Free T4: 0.74 ng/dL (ref 0.61–1.12)

## 2021-11-18 LAB — BASIC METABOLIC PANEL
Anion gap: 11 (ref 5–15)
BUN: 26 mg/dL — ABNORMAL HIGH (ref 8–23)
CO2: 28 mmol/L (ref 22–32)
Calcium: 9.2 mg/dL (ref 8.9–10.3)
Chloride: 98 mmol/L (ref 98–111)
Creatinine, Ser: 1.12 mg/dL — ABNORMAL HIGH (ref 0.44–1.00)
GFR, Estimated: 47 mL/min — ABNORMAL LOW (ref >60)
Glucose, Bld: 118 mg/dL — ABNORMAL HIGH (ref 70–99)
Potassium: 3.8 mmol/L (ref 3.5–5.1)
Sodium: 137 mmol/L (ref 135–145)

## 2021-11-18 LAB — CBC
HCT: 35.5 % — ABNORMAL LOW (ref 36.0–46.0)
Hemoglobin: 11.5 g/dL — ABNORMAL LOW (ref 12.0–15.0)
MCH: 31.1 pg (ref 26.0–34.0)
MCHC: 32.4 g/dL (ref 30.0–36.0)
MCV: 95.9 fL (ref 80.0–100.0)
Platelets: 349 10*3/uL (ref 150–400)
RBC: 3.7 MIL/uL — ABNORMAL LOW (ref 3.87–5.11)
RDW: 13.1 % (ref 11.5–15.5)
WBC: 8.3 10*3/uL (ref 4.0–10.5)
nRBC: 0 % (ref 0.0–0.2)

## 2021-11-18 LAB — TSH: TSH: 1.146 u[IU]/mL (ref 0.350–4.500)

## 2021-11-18 LAB — URINE CULTURE

## 2021-11-18 LAB — VITAMIN B12: Vitamin B-12: 644 pg/mL (ref 180–914)

## 2021-11-18 MED ORDER — DICLOFENAC SODIUM 1 % EX GEL
2.0000 g | Freq: Three times a day (TID) | CUTANEOUS | Status: DC
  Administered 2021-11-18 – 2021-11-20 (×6): 2 g via TOPICAL
  Filled 2021-11-18 (×2): qty 100

## 2021-11-18 NOTE — Progress Notes (Signed)
[place] [place] NAME: [name]    MR#:  [id number]  DATE OF BIRTH:  11-Dec-1928  SUBJECTIVE:   No family at bedside. Patient sitting out in the chair. Earlier was breakfast. C/o left knee pain  VITALS:  Blood pressure (!) 119/50, pulse 83, temperature 97.8 F (36.6 C), resp. rate 13, height '5\' 3"'$  (1.6 m), weight 71.5 kg, SpO2 97 %.  PHYSICAL EXAMINATION:   GENERAL:  88 y.o.-year-old patient lying in the bed with no acute distress. weak LUNGS: Normal breath sounds bilaterally, no wheezing, rales, rhonchi.  CARDIOVASCULAR: S1, S2 normal. No murmurs, rubs, or gallops.  ABDOMEN: Soft, nontender, nondistended. Bowel sounds present.  EXTREMITIES: bilateral chronic edema, DJD+ NEUROLOGIC: nonfocal  patient is alert and awake SKIN: No obvious rash, lesion, or ulcer.   LABORATORY PANEL:  CBC Recent Labs  Lab 11/18/21 0427  WBC 8.3  HGB 11.5*  HCT 35.5*  PLT 349     Chemistries  Recent Labs  Lab 11/16/21 2036 11/16/21 2140 11/17/21 0356 11/18/21 0427  NA 134*  --    < > 137  K 3.9  --    < > 3.8  CL 100  --    < > 98  CO2 24  --    < > 28  GLUCOSE 149*  --    < > 118*  BUN 37*  --    < > 26*  CREATININE 1.47*  --    < > 1.12*  CALCIUM 8.7*  --    < > 9.2  MG  --  2.0  --   --   AST 13*  --   --   --   ALT 9  --   --   --   ALKPHOS 47  --   --   --   BILITOT 1.5*  --   --   --    < > = values in this interval not displayed.     RADIOLOGY:  ECHOCARDIOGRAM COMPLETE  Result Date: 11/17/2021    ECHOCARDIOGRAM REPORT   Patient Name:   [name] [facility] Date of Exam: 11/17/2021 Medical Rec #:  [id number]     Height:       64.0 in Accession #:    [id number]    Weight:       160.0 lb Date of Birth:  06-18-1928    BSA:          1.779 m Patient Age:    11 years      BP:           172/86 mmHg Patient Gender: F             HR:           80 bpm. Exam Location:  [facility] Procedure: 2D Echo, Cardiac Doppler and Color Doppler  Indications:     CHF-acute diastolic D40.81  History:         Patient has prior history of Echocardiogram examinations, most                  recent 04/23/2020. Risk Factors:Hypertension.  Sonographer:     Sherrie Sport Referring Phys:  [id number] [name] Diagnosing Phys: Katrina Cowman MD IMPRESSIONS  1. Left ventricular ejection fraction, by estimation, is 50 to 55%. The left ventricle has low normal function. The left ventricle has no regional wall motion abnormalities. There is mild left ventricular hypertrophy. Left ventricular  diastolic parameters were normal.  2. Right ventricular systolic function is normal. The right ventricular size is normal.  3. The mitral valve is normal in structure. Mild to moderate mitral valve regurgitation. No evidence of mitral stenosis.  4. Tricuspid valve regurgitation is mild to moderate.  5. The aortic valve is normal in structure. Aortic valve regurgitation is mild to moderate. No aortic stenosis is present.  6. The inferior vena cava is normal in size with greater than 50% respiratory variability, suggesting right atrial pressure of 3 mmHg. FINDINGS  Left Ventricle: Left ventricular ejection fraction, by estimation, is 50 to 55%. The left ventricle has low normal function. The left ventricle has no regional wall motion abnormalities. The left ventricular internal cavity size was normal in size. There is mild left ventricular hypertrophy. Left ventricular diastolic parameters were normal. Right Ventricle: The right ventricular size is normal. No increase in right ventricular wall thickness. Right ventricular systolic function is normal. Left Atrium: Left atrial size was normal in size. Right Atrium: Right atrial size was normal in size. Pericardium: There is no evidence of pericardial effusion. Mitral Valve: The mitral valve is normal in structure. Mild to moderate mitral valve regurgitation. No evidence of mitral valve stenosis. Tricuspid Valve: The tricuspid valve is  normal in structure. Tricuspid valve regurgitation is mild to moderate. No evidence of tricuspid stenosis. Aortic Valve: The aortic valve is normal in structure. Aortic valve regurgitation is mild to moderate. No aortic stenosis is present. Aortic valve mean gradient measures 4.0 mmHg. Aortic valve peak gradient measures 8.1 mmHg. Aortic valve area, by VTI measures 2.54 cm. Pulmonic Valve: The pulmonic valve was normal in structure. Pulmonic valve regurgitation is not visualized. No evidence of pulmonic stenosis. Aorta: The aortic root is normal in size and structure. Venous: The inferior vena cava is normal in size with greater than 50% respiratory variability, suggesting right atrial pressure of 3 mmHg. IAS/Shunts: No atrial level shunt detected by color flow Doppler.  LEFT VENTRICLE PLAX 2D LVIDd:         3.60 cm   Diastology LVIDs:         2.60 cm   LV e' medial:    18.10 cm/s LV PW:         1.20 cm   LV E/e' medial:  7.8 LV IVS:        1.20 cm   LV e' lateral:   6.96 cm/s LVOT diam:     2.00 cm   LV E/e' lateral: 20.3 LV SV:         65 LV SV Index:   37 LVOT Area:     3.14 cm  RIGHT VENTRICLE RV Basal diam:  2.80 cm RV Mid diam:    2.40 cm RV S prime:     13.90 cm/s TAPSE (M-mode): 3.4 cm LEFT ATRIUM             Index        RIGHT ATRIUM           Index LA diam:        4.30 cm 2.42 cm/m   RA Area:     12.00 cm LA Vol (A2C):   52.9 ml 29.73 ml/m  RA Volume:   22.50 ml  12.64 ml/m LA Vol (A4C):   52.7 ml 29.62 ml/m LA Biplane Vol: 55.2 ml 31.02 ml/m  AORTIC VALVE AV Area (Vmax):    2.32 cm AV Area (Vmean):   2.37 cm AV Area (VTI):  2.54 cm AV Vmax:           142.00 cm/s AV Vmean:          93.600 cm/s AV VTI:            0.256 m AV Peak Grad:      8.1 mmHg AV Mean Grad:      4.0 mmHg LVOT Vmax:         105.00 cm/s LVOT Vmean:        70.700 cm/s LVOT VTI:          0.207 m LVOT/AV VTI ratio: 0.81  AORTA Ao Root diam: 2.70 cm MITRAL VALVE                TRICUSPID VALVE MV Area (PHT): 4.17 cm     TR Peak  grad:   49.3 mmHg MV Decel Time: 182 msec     TR Vmax:        351.00 cm/s MV E velocity: 141.00 cm/s MV A velocity: 50.80 cm/s   SHUNTS MV E/A ratio:  2.78         Systemic VTI:  0.21 m                             Systemic Diam: 2.00 cm Katrina Cowman MD Electronically signed by Katrina Cowman MD Signature Date/Time: 11/17/2021/1:19:05 PM    Final    DG Chest Port 1 View  Result Date: 11/16/2021 CLINICAL DATA:  Shortness of breath. EXAM: PORTABLE CHEST 1 VIEW COMPARISON:  April 16, 2020. FINDINGS: Stable cardiomegaly with mild central pulmonary vascular congestion. Probable mild bilateral perihilar and basilar pulmonary edema is noted. Small pleural effusions are noted. Bony thorax is unremarkable. IMPRESSION: Stable cardiomegaly with mild central pulmonary vascular congestion and probable mild bilateral pulmonary edema. Small pleural effusions. Electronically Signed   By: Marijo Conception M.D.   On: 11/16/2021 20:37    Assessment and Plan [name] is a 88 y.o. female with medical history significant for Chronic diastolic heart failure, hypertension, PAD, chronic back pain, who presents to the ED with a several day history of dyspnea on exertion.  Patient has a several month history of lower extremity edema, and has been on increasing doses of Lasix as an outpatient, however her symptoms have not improved and continues to have worsening shortness of breath.    [name] is a 88 y.o. female with medical history significant for Chronic diastolic heart failure, hypertension, PAD, chronic back pain, who presents to the ED with a several day history of dyspnea on exertion.  Patient has a several month history of lower extremity edema, and has been on increasing doses of Lasix as an outpatient, however her symptoms have not improved and continues to have worsening shortness of breath  Chest x-ray consistent with CHF. No fever or productive cough at home. Patient does not have dysuria frequency  urgency. This was confirmed with patient's daughter.  Acute on chronic diastolic CHF (congestive heart failure) ([name]) --Potential triggers include A-fib not previously diagnosed, symptomatic anemia --IV Lasix and continue metoprolol and losartan --Continue to trend troponins.   --Hold amlodipine --Daily weights with intake and output monitoring --cont BB, irbesartan   Acute respiratory failure with hypoxia (HCC) --Secondary to CHF -O2 sat was in the low 80s with EMS requiring 6 L-->2 liters --Continue supplemental oxygen and wean as tolerated   Acute anemia Hemoglobin of 10.5, down from  13.5 a month ago Per dter S iron has been followed by PCP dr Nicki Reaper. Last month s iron was 153 --s iron on higher side as out pt labs --I will hold off on any further w/u and have iron studies be done as out pt by dr Nicki Reaper  New onset atrial fibrillation ([name]) --Rate controlled --Continue metoprolol --started on  eliquis by cardiology after d/w dters  Elevated temperature --pt afebrile and wbc normal --no dysuria--d/c abxs --no PNA on CXR --BC neg   AKI (acute kidney injury) ([name]) Creatinine 1.47 up from baseline of 1.01, suspect secondary to Lasix uptitration Continue to monitor and avoid nephrotoxins   Chronic back pain Continue oxycodone and gabapentin   Hypertension Blood pressure slightly elevated Continue metoprolol and losartan               DVT prophylaxis: eliquis   Consults: Cardiology,[place] [id number] [facility]   Advance Care Planning:   Code Status: DNR   Family Communication: Daughter [name] on  phone EDD 11/20/21 Level of care: Progressive Status is: Inpatient Remains inpatient appropriate because: chf    TOTAL TIME TAKING CARE OF THIS PATIENT:35 minutes.  >50% time spent on counselling and coordination of care  Note: This dictation was prepared with Dragon dictation along with smaller phrase technology. Any transcriptional errors that result from this process are  unintentional.  Fritzi Mandes M.D    [facility]   CC: Primary care physician; [name], MD

## 2021-11-18 NOTE — Progress Notes (Signed)
[place] NOTE       Patient ID: [name] MRN: [id number] DOB/AGE: 04/17/1928 88 y.o.  Admit date: 11/16/2021 Referring Physician Dr. Judd Gaudier  Primary Physician Dr. Walker Shadow  Primary Cardiologist Dr. Saralyn Pilar Reason for Consultation CHF, atrial fibrillation  HPI: [name] is a 88 year old female with a past medical history of HFpEF (LVEF 60-65%, G2 DD, no RWMA's, mild LVH 04/2020), hypertension, hereditary hemochromatosis CKD 3, colon cancer s/p resection x2 and chemotherapy who presented to [facility] ED 11/16/2021 with shortness of breath x3 days worth with ambulation and associated generalized weakness.  She was found to be in atrial fibrillation by EMS which appears to be new in onset.  Cardiology is consulted for further assistance.  Interval history: -Renal function improving with diuresis, net negative so far 1.5 L -feels ok today, sitting in recliner with daughter and family at bedside.  -no chest pain, shortness of breath, has some soreness in her left knee   Review of systems complete and found to be negative unless listed above     Past Medical History:  Diagnosis Date   Anemia    Barrett's esophagus    BCC (basal cell carcinoma of skin) 03/07/2013   vertex scalp - NODULAR NODULAR PATTERN PATTERN   Bowel obstruction (HCC)    s/p adhesion resection   Chronic back pain    Colon cancer ([place]) 1988 and 1989   adenocarcinoma, s/p resection x  2 and chemo   Diverticulitis    GERD (gastroesophageal reflux disease)    H/O open leg wound    Hiatal hernia    Hypercholesteremia    Hypertension    Lumbar scoliosis    OA (osteoarthritis)    Peripheral neuropathy    Recurrent sinus infections    Renal cyst    S/P chemotherapy, time since greater than 12 weeks    colon cancer   Vertigo     Past Surgical History:  Procedure Laterality Date   ABDOMINAL HYSTERECTOMY  1982   adhesions resected     bowel obstruction    APPENDECTOMY     BREAST BIOPSY Left    negative 06/14/1985   CHOLECYSTECTOMY  1994   COLON RESECTION     x2.  s/p colon cancer   COLON SURGERY  1958 and 1989   For Colon Cancer   EXCISIONAL HEMORRHOIDECTOMY     with tubal ligation   EXPLORATORY LAPAROTOMY  1991   Secondary to SBO    Medications Prior to Admission  Medication Sig Dispense Refill Last Dose   albuterol (VENTOLIN HFA) 108 (90 Base) MCG/ACT inhaler TAKE 2 PUFFS BY MOUTH EVERY 6 HOURS AS NEEDED FOR WHEEZE OR SHORTNESS OF BREATH 18 g 1 prn at prn   amLODipine (NORVASC) 5 MG tablet TAKE 1 TABLET EVERY DAY 90 tablet 3 11/16/2021 at unknown   calcium carbonate (OSCAL) 1500 (600 Ca) MG TABS tablet Take 600 mg of elemental calcium by mouth 2 (two) times daily with a meal.   11/16/2021 at unknown   Cholecalciferol (VITAMIN D3) 50 MCG (2000 UT) capsule Take 2,000 Units by mouth daily.   11/16/2021 at unknown   docusate sodium (COLACE) 100 MG capsule Take 100 mg by mouth 2 (two) times daily.   11/16/2021 at unknown   fexofenadine (ALLEGRA) 60 MG tablet TAKE 1 TABLET EVERY DAY 90 tablet 3 11/16/2021 at unknown   furosemide (LASIX) 20 MG tablet TAKE 1 TABLET EVERY DAY 90 tablet 1 11/16/2021 at unknown  gabapentin (NEURONTIN) 100 MG capsule TAKE 2 CAPSULES AT BEDTIME 180 capsule 1 11/15/2021 at unknown   irbesartan (AVAPRO) 300 MG tablet TAKE 1 TABLET EVERY DAY 90 tablet 1 11/16/2021 at unknown   lisinopril-hydrochlorothiazide (ZESTORETIC) 10-12.5 MG tablet Take 1 tablet by mouth daily.   11/16/2021 at unknown   meclizine (ANTIVERT) 12.5 MG tablet Take 1 tablet (12.5 mg total) by mouth 2 (two) times daily as needed for dizziness. 20 tablet 0 prn at prn   metoCLOPramide (REGLAN) 5 MG tablet Take 5 mg by mouth 2 (two) times daily.   11/16/2021 at unknown   metoprolol succinate (TOPROL-XL) 25 MG 24 hr tablet TAKE 1 TABLET TWICE DAILY 180 tablet 1 11/16/2021 at unknown   mometasone (ELOCON) 0.1 % cream Apply 1 application topically See admin instructions.  Apply at bedtime to affected areas under breast and lower abdominal folder on tuesdays, thursdays, and Saturdays weekly. 50 g 3 11/16/2021 at unknown   ondansetron (ZOFRAN ODT) 4 MG disintegrating tablet Take 1 tablet (4 mg total) by mouth 2 (two) times daily as needed for nausea or vomiting. 20 tablet 0 prn at prn   oxyCODONE (OXY IR/ROXICODONE) 5 MG immediate release tablet Take 5 mg by mouth 3 (three) times daily.    11/16/2021 at unknown   simvastatin (ZOCOR) 10 MG tablet TAKE 1 TABLET (10 MG TOTAL) AT BEDTIME. 90 tablet 3 11/16/2021 at unknown   traMADol (ULTRAM) 50 MG tablet Take 1 tablet by mouth at bedtime as needed.   prn at prn   losartan (COZAAR) 50 MG tablet Take 50 mg by mouth daily. (Patient not taking: Reported on 11/17/2021)   Not Taking   Spacer/Aero-Holding Chambers [facility] DIAMOND) MISC       traMADol-acetaminophen (ULTRACET) 37.5-325 MG per tablet Take 1 tablet by mouth 2 (two) times daily as needed.  (Patient not taking: Reported on 11/17/2021)   Not Taking    Social History   Socioeconomic History   Marital status: Married    Spouse name: Not on file   Number of children: 4   Years of education: 12th grade   Highest education level: Not on file  Occupational History   Occupation: homemaker  Tobacco Use   Smoking status: Never   Smokeless tobacco: Never  Vaping Use   Vaping Use: Never used  Substance and Sexual Activity   Alcohol use: No    Alcohol/week: 0.0 standard drinks of alcohol   Drug use: No   Sexual activity: Not Currently  Other Topics Concern   Not on file  Social History Narrative   Not on file   Social Determinants of Health   Financial Resource Strain: Low Risk  (11/30/2020)   Overall Financial Resource Strain (CARDIA)    Difficulty of Paying Living Expenses: Not hard at all  Food Insecurity: No Food Insecurity (11/17/2021)   Hunger Vital Sign    Worried About Running Out of Food in the Last Year: Never true    [place] in the Last  Year: Never true  Transportation Needs: No Transportation Needs (11/17/2021)   PRAPARE - Hydrologist (Medical): No    Lack of Transportation (Non-Medical): No  Physical Activity: Unknown (11/30/2020)   Exercise Vital Sign    Days of Exercise per Week: 0 days    Minutes of Exercise per Session: Not on file  Stress: No Stress Concern Present (11/30/2020)   [place]  Feeling of Stress : Not at all  Social Connections: Unknown (11/30/2020)   Social Connection and Isolation Panel [NHANES]    Frequency of Communication with Friends and Family: More than three times a week    Frequency of Social Gatherings with Friends and Family: More than three times a week    Attends Religious Services: Not on file    Active Member of Clubs or Organizations: Not on file    Attends Archivist Meetings: Not on file    Marital Status: Married  Intimate Partner Violence: Not At Risk (11/17/2021)   Humiliation, Afraid, Rape, and Kick questionnaire    Fear of Current or Ex-Partner: No    Emotionally Abused: No    Physically Abused: No    Sexually Abused: No    Family History  Problem Relation Age of Onset   Breast cancer Mother    Asthma Mother    Stroke Father    Hypertension Father    Diabetes Father    Ovarian cancer Sister        x2   Prostate cancer Brother    Hypertension Brother        x3   Heart disease Brother    Hypercholesterolemia Brother        x3   Diabetes Brother    Spina bifida Grandchild    Hematuria Neg Hx    Kidney cancer Neg Hx    Kidney disease Neg Hx    Sickle cell trait Neg Hx    Tuberculosis Neg Hx      Vitals:   11/17/21 2337 11/18/21 0356 11/18/21 0429 11/18/21 0736  BP: (!) 150/74 (!) 157/63  139/80  Pulse: 79 90  75  Resp: '19 16  14  '$ Temp: 98.9 F (37.2 C) 98.7 F (37.1 C)  98.7 F (37.1 C)  TempSrc: Oral Oral    SpO2: 98% 94%  97%  Weight:   71.5  kg   Height:        PHYSICAL EXAM General: Pleasant elderly Caucasian female, well nourished, in no acute distress.  Sitting upright in recliner with multiple family members at bedside  HEENT:  Normocephalic and atraumatic. Neck:  No JVD.  Lungs: Normal respiratory effort on 2 L by nasal cannula.  Decreased breath sounds with trace bibasilar crackles.   Heart: Irregularly irregular with controlled rate. Normal S1 and S2 without gallops or murmurs.  Abdomen: Non-distended appearing.  Msk: Normal strength and tone for age. Extremities: Warm and well perfused. No clubbing, cyanosis.  2+ bilateral lower extremity edema improving just below her knees Neuro: Alert and oriented X 3. Psych:  Answers questions appropriately.   Labs: Basic Metabolic Panel: Recent Labs    11/16/21 2140 11/17/21 0356 11/18/21 0427  NA  --  138 137  K  --  3.8 3.8  CL  --  101 98  CO2  --  26 28  GLUCOSE  --  143* 118*  BUN  --  34* 26*  CREATININE  --  1.43* 1.12*  CALCIUM  --  8.9 9.2  MG 2.0  --   --     Liver Function Tests: Recent Labs    11/16/21 2036  AST 13*  ALT 9  ALKPHOS 47  BILITOT 1.5*  PROT 6.5  ALBUMIN 3.2*    No results for input(s): "LIPASE", "AMYLASE" in the last 72 hours. CBC: Recent Labs    11/16/21 2036 11/17/21 0356 11/18/21 0427  WBC 9.9 9.7 8.3  NEUTROABS 7.9*  --   --   HGB 10.5* 10.5* 11.5*  HCT 33.8* 32.3* 35.5*  MCV 102.1* 98.8 95.9  PLT 283 315 349    Cardiac Enzymes: Recent Labs    11/16/21 2338 11/17/21 0356  TROPONINIHS 15 17    BNP: Recent Labs    11/16/21 2036  BNP 600.3*    D-Dimer: No results for input(s): "DDIMER" in the last 72 hours. Hemoglobin A1C: No results for input(s): "HGBA1C" in the last 72 hours. Fasting Lipid Panel: No results for input(s): "CHOL", "HDL", "LDLCALC", "TRIG", "CHOLHDL", "LDLDIRECT" in the last 72 hours. Thyroid Function Tests: No results for input(s): "TSH", "T4TOTAL", "T3FREE", "THYROIDAB" in the last  72 hours.  Invalid input(s): "FREET3" Anemia Panel: Recent Labs    11/16/21 2338 11/17/21 0356 11/17/21 1818  VITAMINB12  --   --  644  FOLATE 8.8  --   --   FERRITIN 259  --   --   TIBC 207*  --   --   IRON 15*  --   --   RETICCTPCT  --  2.0  --       Radiology: ECHOCARDIOGRAM COMPLETE  Result Date: 11/17/2021    ECHOCARDIOGRAM REPORT   Patient Name:   [name] Date of Exam: 11/17/2021 Medical Rec #:  [id number]     Height:       64.0 in Accession #:    [id number]    Weight:       160.0 lb Date of Birth:  15-Jun-1928    BSA:          1.779 m Patient Age:    26 years      BP:           172/86 mmHg Patient Gender: F             HR:           80 bpm. Exam Location:  [facility] Procedure: 2D Echo, Cardiac Doppler and Color Doppler Indications:     CHF-acute diastolic I71.24  History:         Patient has prior history of Echocardiogram examinations, most                  recent 04/23/2020. Risk Factors:Hypertension.  Sonographer:     Sherrie Sport Referring Phys:  [id number] Athena Masse Diagnosing Phys: Isaias Cowman MD IMPRESSIONS  1. Left ventricular ejection fraction, by estimation, is 50 to 55%. The left ventricle has low normal function. The left ventricle has no regional wall motion abnormalities. There is mild left ventricular hypertrophy. Left ventricular diastolic parameters were normal.  2. Right ventricular systolic function is normal. The right ventricular size is normal.  3. The mitral valve is normal in structure. Mild to moderate mitral valve regurgitation. No evidence of mitral stenosis.  4. Tricuspid valve regurgitation is mild to moderate.  5. The aortic valve is normal in structure. Aortic valve regurgitation is mild to moderate. No aortic stenosis is present.  6. The inferior vena cava is normal in size with greater than 50% respiratory variability, suggesting right atrial pressure of 3 mmHg. FINDINGS  Left Ventricle: Left ventricular ejection fraction, by estimation, is 50 to 55%.  The left ventricle has low normal function. The left ventricle has no regional wall motion abnormalities. The left ventricular internal cavity size was normal in size. There is mild left ventricular hypertrophy. Left ventricular diastolic parameters were normal. Right Ventricle: The right ventricular size is normal. No  increase in right ventricular wall thickness. Right ventricular systolic function is normal. Left Atrium: Left atrial size was normal in size. Right Atrium: Right atrial size was normal in size. Pericardium: There is no evidence of pericardial effusion. Mitral Valve: The mitral valve is normal in structure. Mild to moderate mitral valve regurgitation. No evidence of mitral valve stenosis. Tricuspid Valve: The tricuspid valve is normal in structure. Tricuspid valve regurgitation is mild to moderate. No evidence of tricuspid stenosis. Aortic Valve: The aortic valve is normal in structure. Aortic valve regurgitation is mild to moderate. No aortic stenosis is present. Aortic valve mean gradient measures 4.0 mmHg. Aortic valve peak gradient measures 8.1 mmHg. Aortic valve area, by VTI measures 2.54 cm. Pulmonic Valve: The pulmonic valve was normal in structure. Pulmonic valve regurgitation is not visualized. No evidence of pulmonic stenosis. Aorta: The aortic root is normal in size and structure. Venous: The inferior vena cava is normal in size with greater than 50% respiratory variability, suggesting right atrial pressure of 3 mmHg. IAS/Shunts: No atrial level shunt detected by color flow Doppler.  LEFT VENTRICLE PLAX 2D LVIDd:         3.60 cm   Diastology LVIDs:         2.60 cm   LV e' medial:    18.10 cm/s LV PW:         1.20 cm   LV E/e' medial:  7.8 LV IVS:        1.20 cm   LV e' lateral:   6.96 cm/s LVOT diam:     2.00 cm   LV E/e' lateral: 20.3 LV SV:         65 LV SV Index:   37 LVOT Area:     3.14 cm  RIGHT VENTRICLE RV Basal diam:  2.80 cm RV Mid diam:    2.40 cm RV S prime:     13.90 cm/s  TAPSE (M-mode): 3.4 cm LEFT ATRIUM             Index        RIGHT ATRIUM           Index LA diam:        4.30 cm 2.42 cm/m   RA Area:     12.00 cm LA Vol (A2C):   52.9 ml 29.73 ml/m  RA Volume:   22.50 ml  12.64 ml/m LA Vol (A4C):   52.7 ml 29.62 ml/m LA Biplane Vol: 55.2 ml 31.02 ml/m  AORTIC VALVE AV Area (Vmax):    2.32 cm AV Area (Vmean):   2.37 cm AV Area (VTI):     2.54 cm AV Vmax:           142.00 cm/s AV Vmean:          93.600 cm/s AV VTI:            0.256 m AV Peak Grad:      8.1 mmHg AV Mean Grad:      4.0 mmHg LVOT Vmax:         105.00 cm/s LVOT Vmean:        70.700 cm/s LVOT VTI:          0.207 m LVOT/AV VTI ratio: 0.81  AORTA Ao Root diam: 2.70 cm MITRAL VALVE                TRICUSPID VALVE MV Area (PHT): 4.17 cm     TR Peak grad:   49.3 mmHg MV Decel  Time: 182 msec     TR Vmax:        351.00 cm/s MV E velocity: 141.00 cm/s MV A velocity: 50.80 cm/s   SHUNTS MV E/A ratio:  2.78         Systemic VTI:  0.21 m                             Systemic Diam: 2.00 cm Isaias Cowman MD Electronically signed by Isaias Cowman MD Signature Date/Time: 11/17/2021/1:19:05 PM    Final    DG Chest Port 1 View  Result Date: 11/16/2021 CLINICAL DATA:  Shortness of breath. EXAM: PORTABLE CHEST 1 VIEW COMPARISON:  April 16, 2020. FINDINGS: Stable cardiomegaly with mild central pulmonary vascular congestion. Probable mild bilateral perihilar and basilar pulmonary edema is noted. Small pleural effusions are noted. Bony thorax is unremarkable. IMPRESSION: Stable cardiomegaly with mild central pulmonary vascular congestion and probable mild bilateral pulmonary edema. Small pleural effusions. Electronically Signed   By: Marijo Conception M.D.   On: 11/16/2021 20:37    ECHO 04/23/2020  1. Left ventricular ejection fraction, by estimation, is 60 to 65%. The  left ventricle has normal function. The left ventricle has no regional  wall motion abnormalities. There is moderate left ventricular hypertrophy.   Left ventricular diastolic  parameters are consistent with Grade II diastolic dysfunction  (pseudonormalization). The average left ventricular global longitudinal  strain is -22.0 %. The global longitudinal strain is normal.   2. Right ventricular systolic function is normal. The right ventricular  size is normal. There is moderately elevated pulmonary artery systolic  pressure. The estimated right ventricular systolic pressure is 89.3 mmHg.   3. The mitral valve is normal in structure. Mild mitral valve  regurgitation.   4. Tricuspid valve regurgitation is mild to moderate.   TELEMETRY reviewed by me (LT) 11/18/2021 : atrial fibrillation 80s-90s  EKG reviewed by me: Atrial fibrillation rate 89 bpm  Data reviewed by me (LT) 11/18/2021: last cardiology note, ed note, admission H&P, cbc, bmp, troponins, bnp, last echo, ekgs, UA   Principal Problem:   Acute on chronic diastolic CHF (congestive heart failure) ([place]) Active Problems:   Hypertension   Chronic back pain   AKI (acute kidney injury) ([place])   New onset atrial fibrillation (HCC)   Acute anemia   Acute respiratory failure with hypoxia ([place])   Elevated temperature    ASSESSMENT AND PLAN:  [name] is a 88 year old female with a past medical history of HFpEF (LVEF 60-65%, G2 DD, no RWMA's, mild LVH 04/2020), hypertension, hereditary hemochromatosis CKD 3, colon cancer s/p resection x2 and chemotherapy who presented to [facility] ED 11/16/2021 with shortness of breath x3 days worth with ambulation and associated generalized weakness.  She was found to be in atrial fibrillation by EMS which appears to be new in onset.  Cardiology is consulted for further assistance.  #Acute cystitis With moderate leukocyte esterase and bacterial growth on UA, culture pending.  Temperature elevated at 100.2 on presentation.  Started on empiric azithromycin and ceftriaxone on admission.  Management per primary team.  #Acute on chronic HFpEF (EF 60-65%,  G2 DD, mild LVH 04/2020) Presents with 4 days of worsening shortness of breath and peripheral edema, refractory to doubling her Lasix dose to 40 mg once a day for 3 days.  Denies increased salt or water intake (daughter states she drinks about 32 ounces total per day).  BNP is elevated  to 600 and she is clinically volume overloaded on exam with 2+ pitting edema in her lower legs and an oxygen requirement initially 6 L by nasal cannula. -S/p 40 mg IV Lasix x2 with clinical improvement and weaning of her oxygen requirement. -Continue IV Lasix 40 mg twice daily for now, renal function improving with diuresis -Continue GDMT with metoprolol XL 25 mg twice a day, irbesartan 300 mg once daily -echo complete resulted with preserved LVEF, mild to moderate AR and TR -Strict I's/O  #New onset paroxysmal atrial fibrillation Seen on initial EKG, rate controlled in the 70s to 90s on telemetry so far.  Possibly contributing to her heart failure symptoms and volume overload. -Discussed the risks and benefits of starting anticoagulation for stroke risk reduction with Eliquis 5 mg twice a day.  Patient and her daughters are in agreement. -Continue metoprolol XL 25 mg twice daily -check tsh free t4  -Continuous monitoring on telemetry -continue rate control for now, consider rhythm control strategy after 3 weeks of AC on an outpatient basis   #AKI on CKD 3 creatinine  improving: 1.43 to 1.12 and and GFR of 35 to 47. Baseline from 05/2021 was 1.2 and 42.  Monitor closely with diuresis.  This patient's plan of care was discussed and created with Dr. Saralyn Pilar and he is in agreement.  Signed: Tristan Schroeder , PA-C 11/18/2021, 10:42 AM [facility] Cardiology

## 2021-11-18 NOTE — Progress Notes (Signed)
  Transition of Care [facility]) Screening Note   Patient Details  Name: MARGUERETTE SHELLER Date of Birth: 08-23-1928   Transition of Care [facility]) CM/SW Contact:    Alberteen Sam, LCSW Phone Number: 11/18/2021, 8:58 AM    Transition of Care Department [facility]) has reviewed patient and no TOC needs have been identified at this time. We will continue to monitor patient advancement through interdisciplinary progression rounds. If new patient transition needs arise, please place a TOC consult.  [place], [place]

## 2021-11-19 DIAGNOSIS — I5033 Acute on chronic diastolic (congestive) heart failure: Secondary | ICD-10-CM | POA: Diagnosis not present

## 2021-11-19 LAB — BASIC METABOLIC PANEL
Anion gap: 11 (ref 5–15)
BUN: 38 mg/dL — ABNORMAL HIGH (ref 8–23)
CO2: 29 mmol/L (ref 22–32)
Calcium: 9.8 mg/dL (ref 8.9–10.3)
Chloride: 97 mmol/L — ABNORMAL LOW (ref 98–111)
Creatinine, Ser: 1.14 mg/dL — ABNORMAL HIGH (ref 0.44–1.00)
GFR, Estimated: 46 mL/min — ABNORMAL LOW (ref >60)
Glucose, Bld: 124 mg/dL — ABNORMAL HIGH (ref 70–99)
Potassium: 3.7 mmol/L (ref 3.5–5.1)
Sodium: 137 mmol/L (ref 135–145)

## 2021-11-19 MED ORDER — FUROSEMIDE 40 MG PO TABS
40.0000 mg | ORAL_TABLET | Freq: Every day | ORAL | Status: DC
  Administered 2021-11-20: 40 mg via ORAL
  Filled 2021-11-19: qty 1

## 2021-11-19 NOTE — Progress Notes (Signed)
[facility] Cardiology  SUBJECTIVE: Patient laying in bed, reports feeling better, denies chest pain, with less shortness of breath   Vitals:   11/18/21 2320 11/19/21 0507 11/19/21 0740 11/19/21 1118  BP:  (!) 143/66 (!) 133/59 (!) 117/53  Pulse:   76 63  Resp:  '18 17 18  '$ Temp:  98.2 F (36.8 C) 98.7 F (37.1 C) 98.1 F (36.7 C)  TempSrc:  Oral Oral   SpO2: 95% 97% 96% 94%  Weight:      Height:         Intake/Output Summary (Last 24 hours) at 11/19/2021 1425 Last data filed at 11/19/2021 1000 Gross per 24 hour  Intake 480 ml  Output 600 ml  Net -120 ml      PHYSICAL EXAM  General: Well developed, well nourished, in no acute distress HEENT:  Normocephalic and atramatic Neck:  No JVD.  Lungs: Clear bilaterally to auscultation and percussion. Heart: HRRR . Normal S1 and S2 without gallops or murmurs.  Abdomen: Bowel sounds are positive, abdomen soft and non-tender  Msk:  Back normal, normal gait. Normal strength and tone for age. Extremities: No clubbing, cyanosis or edema.   Neuro: Alert and oriented X 3. Psych:  Good affect, responds appropriately   LABS: Basic Metabolic Panel: Recent Labs    11/16/21 2140 11/17/21 0356 11/18/21 0427 11/19/21 0443  NA  --    < > 137 137  K  --    < > 3.8 3.7  CL  --    < > 98 97*  CO2  --    < > 28 29  GLUCOSE  --    < > 118* 124*  BUN  --    < > 26* 38*  CREATININE  --    < > 1.12* 1.14*  CALCIUM  --    < > 9.2 9.8  MG 2.0  --   --   --    < > = values in this interval not displayed.   Liver Function Tests: Recent Labs    11/16/21 2036  AST 13*  ALT 9  ALKPHOS 47  BILITOT 1.5*  PROT 6.5  ALBUMIN 3.2*   No results for input(s): "LIPASE", "AMYLASE" in the last 72 hours. CBC: Recent Labs    11/16/21 2036 11/17/21 0356 11/18/21 0427  WBC 9.9 9.7 8.3  NEUTROABS 7.9*  --   --   HGB 10.5* 10.5* 11.5*  HCT 33.8* 32.3* 35.5*  MCV 102.1* 98.8 95.9  PLT 283 315 349   Cardiac Enzymes: No results for input(s):  "CKTOTAL", "CKMB", "CKMBINDEX", "TROPONINI" in the last 72 hours. BNP: Invalid input(s): "POCBNP" D-Dimer: No results for input(s): "DDIMER" in the last 72 hours. Hemoglobin A1C: No results for input(s): "HGBA1C" in the last 72 hours. Fasting Lipid Panel: No results for input(s): "CHOL", "HDL", "LDLCALC", "TRIG", "CHOLHDL", "LDLDIRECT" in the last 72 hours. Thyroid Function Tests: Recent Labs    11/18/21 0427  TSH 1.146   Anemia Panel: Recent Labs    11/16/21 2338 11/17/21 0356 11/17/21 1818  VITAMINB12  --   --  644  FOLATE 8.8  --   --   FERRITIN 259  --   --   TIBC 207*  --   --   IRON 15*  --   --   RETICCTPCT  --  2.0  --     No results found.   Echo EF 50-55%, mild to moderate mitral gravitation, mild to moderate tricuspid regurgitation, mild  to moderate aortic insufficiency  11/17/2021  TELEMETRY: Atrial fibrillation 82 bpm:  ASSESSMENT AND PLAN:  Principal Problem:   Acute on chronic diastolic CHF (congestive heart failure) (HCC) Active Problems:   Hypertension   Chronic back pain   AKI (acute kidney injury) ([place])   New onset atrial fibrillation (HCC)   Acute anemia   Acute respiratory failure with hypoxia (HCC)   Elevated temperature    1.  Acute on chronic diastolic congestive heart failure, HFpEF, 50-55% 11/17/2021 2.  Paroxysmal atrial fibrillation, on Eliquis for stroke prevention, metoprolol succinate for rate and rhythm control 3.  AKI / CKD 4.  Anemia, hemoglobin hematocrit 11.5 and 35.5, respectively  Recommendations  1.  Continue diuresis 2.  Carefully monitor renal status 3.  Continue Eliquis for stroke prevention 4.  Continue metoprolol succinate and irbesartan 5.  Probable DC home in a.m., follow-up with me in 1 to 2 weeks after discharge   Isaias Cowman, MD, PhD, [facility] 11/19/2021 2:25 PM

## 2021-11-19 NOTE — Progress Notes (Signed)
[facility] at [place] NAME: [name]    MR#:  [id number]  DATE OF BIRTH:  1928-06-12  SUBJECTIVE:   No family at bedside. Patient sitting out in the chair. Earlier was breakfast. C/o left knee pain--a bit better after diclofenac gel both patient's daughters are in the room. Patient was sitting out in the chair. Appears to be breathing comfortably. On room air. Good urine output. VITALS:  Blood pressure (!) 117/53, pulse 63, temperature 98.1 F (36.7 C), resp. rate 18, height '5\' 3"'$  (1.6 m), weight 71.5 kg, SpO2 94 %.  PHYSICAL EXAMINATION:   GENERAL:  88 y.o.-year-old patient lying in the bed with no acute distress. weak LUNGS: Normal breath sounds bilaterally, no wheezing, rales, rhonchi.  CARDIOVASCULAR: S1, S2 normal. No murmurs, rubs, or gallops.  ABDOMEN: Soft, nontender, nondistended. Bowel sounds present.  EXTREMITIES: bilateral chronic edema, DJD+ NEUROLOGIC: nonfocal  patient is alert and awake SKIN: No obvious rash, lesion, or ulcer.   LABORATORY PANEL:  CBC Recent Labs  Lab 11/18/21 0427  WBC 8.3  HGB 11.5*  HCT 35.5*  PLT 349     Chemistries  Recent Labs  Lab 11/16/21 2036 11/16/21 2140 11/17/21 0356 11/19/21 0443  NA 134*  --    < > 137  K 3.9  --    < > 3.7  CL 100  --    < > 97*  CO2 24  --    < > 29  GLUCOSE 149*  --    < > 124*  BUN 37*  --    < > 38*  CREATININE 1.47*  --    < > 1.14*  CALCIUM 8.7*  --    < > 9.8  MG  --  2.0  --   --   AST 13*  --   --   --   ALT 9  --   --   --   ALKPHOS 47  --   --   --   BILITOT 1.5*  --   --   --    < > = values in this interval not displayed.     RADIOLOGY:  No results found.  Assessment and Plan [name] is a 88 y.o. female with medical history significant for Chronic diastolic heart failure, hypertension, PAD, chronic back pain, who presents to the ED with a several day history of dyspnea on exertion.  Patient has a several month history of  lower extremity edema, and has been on increasing doses of Lasix as an outpatient, however her symptoms have not improved and continues to have worsening shortness of breath.    [name] is a 88 y.o. female with medical history significant for Chronic diastolic heart failure, hypertension, PAD, chronic back pain, who presents to the ED with a several day history of dyspnea on exertion.  Patient has a several month history of lower extremity edema, and has been on increasing doses of Lasix as an outpatient, however her symptoms have not improved and continues to have worsening shortness of breath  Chest x-ray consistent with CHF. No fever or productive cough at home. Patient does not have dysuria frequency urgency. This was confirmed with patient's daughter.  Acute on chronic diastolic CHF (congestive heart failure) ([place]) --Potential triggers include A-fib not previously diagnosed, symptomatic anemia --IV Lasix and continue metoprolol and losartan --Continue to trend troponins.   --Hold amlodipine --Daily weights with intake and output  monitoring --cont BB, irbesartan -- diuresis in well. Creatinine trending down. Will change to PO Lasix from tomorrow. -- Currently on room air   Acute respiratory failure with hypoxia ([place]) --Secondary to CHF -O2 sat was in the low 80s with EMS requiring 6 L-->2 liters-->on RA   Acute anemia Hemoglobin of 10.5, down from 13.5 a month ago Per dter S iron has been followed by PCP dr Nicki Reaper. Last month s iron was 153 --s iron on higher side as out pt labs --I will hold off on any further w/u and have iron studies be done as out pt by dr Nicki Reaper  New onset atrial fibrillation ([place]) --Rate controlled --Continue metoprolol --started on  eliquis by cardiology after d/w dters  Elevated temperature --pt afebrile and wbc normal --no dysuria--d/c abxs --no PNA on CXR --BC neg   AKI (acute kidney injury) ([place]) Creatinine 1.47 up from baseline of 1.01,  suspect secondary to Lasix uptitration Continue to monitor and avoid nephrotoxins --creat down to 1.12   Chronic back pain Continue oxycodone and gabapentin   Hypertension Blood pressure slightly elevated Continue metoprolol and losartan     patient is now in agreement with physical therapy evaluation. Will have PT OT see patient. Plan for discharge home tomorrow if remains stable. Patient and daughter is agreeable.         DVT prophylaxis: eliquis   Consults: Cardiology,[facility]   Advance Care Planning:   Code Status: DNR   Family Communication: Daughters in the room EDD 11/20/21 Level of care: Progressive Status is: Inpatient Remains inpatient appropriate because: chf    TOTAL TIME TAKING CARE OF THIS PATIENT:35 minutes.  >50% time spent on counselling and coordination of care  Note: This dictation was prepared with Dragon dictation along with smaller phrase technology. Any transcriptional errors that result from this process are unintentional.  Fritzi Mandes M.D    [facility]   CC: Primary care physician; [name], MD

## 2021-11-19 NOTE — Care Management Important Message (Signed)
Important Message  Patient Details  Name: [name] MRN: [id number] Date of Birth: May 29, 1928   Medicare Important Message Given:  Yes     [name] 11/19/2021, 1:41 PM

## 2021-11-19 NOTE — Evaluation (Signed)
Occupational Therapy Evaluation Patient Details Name: [name] MRN: [id number] DOB: [birth year] Today's Date: 11/19/2021   History of Present Illness 88 y.o. female with PMHx significant for HTN, PAD, chronic back pain, HFpEF (LVEF 60-65%, G2 DD, no RWMA's, mild LVH 04/2020), hereditary hemochromatosis CKD 3, colon cancer s/p resection x2 and chemotherapy who presented to [facility] ED 11/16/2021 with shortness of breath x3 days worth with ambulation and associated generalized weakness. She was found to be in atrial fibrillation by EMS which appears to be new in onset.   Clinical Impression   Pt was seen for OT evaluation this date. Prior to hospital admission, pt was living with her spouse in a 1 story home with 1 STE. She requires assist from a lift recliner and/or family for all transfers and has recently only been able to walk 10-12' with her rollator and family assist in the home, whereas before that was able to walk 50-60'. Pt receives assist for LB dressing and seated bathing from spouse and assist for meals, housekeeping, med mgt, and transportation from 4 daughters who live locally and assist nearly daily. Pt presents to acute OT demonstrating impaired ADL performance and functional mobility 2/2 decreased strength, activity tolerance, cardiopulmonary status requiring 1L O2 with exertional activity, and impaired balance (See OT problem list). Pt currently requires MAX A with RW to attempt standing twice from recliner with MAX VC for sequencing, hand and foot placement, and RW mgt. Pt demonstrates a heavy posterior lean and unable to fully achieve upright posture without MAX A to maintain static standing balance. Pt/family instructed in ADL transfer training, OT recommendations, and AE/DME that would improve pt's safety. Pt would benefit from skilled OT services to address noted impairments and functional limitations (see below for any additional details) in order to maximize safety and independence  while minimizing falls risk and caregiver burden. Upon hospital discharge, recommend STR to maximize pt safety and return to PLOF. Family endorse desire to return home at discharge. If pt discharges home, she will benefit from [facility], a [place], 2WW, and encouraged family to consider a sit to stand lift to more safely transfer should pt be unable to ambulate more safely with less assist. MD, RN, and TOC notified.     Recommendations for follow up therapy are one component of a multi-disciplinary discharge planning process, led by the attending physician.  Recommendations may be updated based on patient status, additional functional criteria and insurance authorization.   Follow Up Recommendations  Skilled nursing-short term rehab (<3 hours/day)    Assistance Recommended at Discharge Frequent or constant Supervision/Assistance  Patient can return home with the following Two people to help with walking and/or transfers;A lot of help with bathing/dressing/bathroom;Assistance with cooking/housework;Assist for transportation;Help with stairs or ramp for entrance;Direct supervision/assist for medications management    Functional Status Assessment  Patient has had a recent decline in their functional status and demonstrates the ability to make significant improvements in function in a reasonable and predictable amount of time.  Equipment Recommendations  BSC/3in1;Other (comment) (2WW, transport chair, and consider sit to stand lift and ramp for home entrance)    Recommendations for Other Services       Precautions / Restrictions Precautions Precautions: Fall Restrictions Weight Bearing Restrictions: No      Mobility Bed Mobility               General bed mobility comments: NT, up in recliner (never sleeps in a bed FYI)    Transfers Overall transfer level:  Needs assistance Equipment used: Rolling walker (2 wheels) Transfers: Sit to/from Stand Sit to Stand: Max assist            General transfer comment: from recliner, pt required MAX A + MAX VC for hand placement, sequencing, and anterior weight shift to edge of the chair prior to lift off. Heavy posterior lean limiting ability to fully come to upright position without MAX A for static standing balance      Balance Overall balance assessment: Needs assistance Sitting-balance support: Single extremity supported, Feet supported, Bilateral upper extremity supported Sitting balance-Leahy Scale: Fair     Standing balance support: Bilateral upper extremity supported, Reliant on assistive device for balance Standing balance-Leahy Scale: Zero Standing balance comment: Heavy posterior lean limiting ability to fully come to upright position without MAX A for static standing balance                           ADL either performed or assessed with clinical judgement   ADL Overall ADL's : Needs assistance/impaired                                       General ADL Comments: Pt currently requires MAX A for LB ADL tasks (suspect this is near baseline), MIN A for seated UB ADL tasks, and MAX A to attempt ADL transfers but ultimately likely needs +2 physical assist to complete.     Vision         Perception     Praxis      Pertinent Vitals/Pain Pain Assessment Pain Assessment: Faces Faces Pain Scale: Hurts little more Pain Location: L knee Pain Descriptors / Indicators: Aching Pain Intervention(s): Limited activity within patient's tolerance, Monitored during session, Repositioned, Patient requesting pain meds-RN notified, RN gave pain meds during session     Hand Dominance     Extremity/Trunk Assessment Upper Extremity Assessment Upper Extremity Assessment: Generalized weakness   Lower Extremity Assessment Lower Extremity Assessment: Generalized weakness   Cervical / Trunk Assessment Cervical / Trunk Assessment: Kyphotic   Communication Communication Communication: HOH    Cognition Arousal/Alertness: Awake/alert Behavior During Therapy: WFL for tasks assessed/performed                                   General Comments: Pt demo's some difficulty with STM, endorses being able to do a bit more for herself than family/spouse reports she is able to do. Follows commands with cues.     General Comments  SpO2 on room air at rest 90-93%, HR 77. With standing trials, SpO2 desats to 86-88% on room air, HR in high 90's to low 100's. Required 1L O2 and PLB to improve SpO2 to mid 90's. Left on 1L. RN aware.    Exercises Other Exercises Other Exercises: Pt/spouse instructed in ADL transfer training, weight shifting to help pt scoot towards the edge of the chair, VC for hand placement and RW mgt, and educated in PLB to support breath recovery. Other Exercises: Pt/family educated in acute OT role and recommendations as well as AE/DME recommended to maximize pt's safety.   Shoulder Instructions      Home Living Family/patient expects to be discharged to:: Private residence Living Arrangements: Spouse/significant other Available Help at Discharge: Family;Available 24 hours/day;Available PRN/intermittently (4 daughters live locally and  switch off coming by the house) Type of Home: House Home Access: Stairs to enter [employer] of Steps: 1 Entrance Stairs-Rails:  (post) Home Layout: One level     Bathroom Shower/Tub: Occupational psychologist: Handicapped height     Home Equipment: Rollator (4 wheels);Shower seat;Hand held shower head;Other (comment)   Additional Comments: Pt sleeps in a lift recliner and spends majority of her time in lift recliner, uses it to stand her up      Prior Functioning/Environment Prior Level of Function : Needs assist;History of Falls (last six months)             Mobility Comments: Pt requires lift recliner and/or family to assist with transfers, using rollator for walking short distances (was  previously able to walk 50-60' at a time but very recently has only been able to walk 10-12' with assist from family). Per family, a w/c will not fit through doorways at home. ADLs Comments: Pt requires assist for LB dressing, bathing from seated position, meals, housekeeping, driving, and med mgt. Daughters come nearly daily to provide meals and assist with housekeeping tasks and transportation. Great family support available.        OT Problem List: Decreased strength;Pain;Cardiopulmonary status limiting activity;Decreased activity tolerance;Decreased knowledge of use of DME or AE;Impaired balance (sitting and/or standing)      OT Treatment/Interventions: Self-care/ADL training;Therapeutic exercise;Therapeutic activities;Patient/family education;DME and/or AE instruction;Balance training    OT Goals(Current goals can be found in the care plan section) Acute Rehab OT Goals Patient Stated Goal: get home OT Goal Formulation: With patient/family Time For Goal Achievement: 12/03/21 Potential to Achieve Goals: Good ADL Goals Pt Will Transfer to Toilet: with mod assist;stand pivot transfer;bedside commode (LRAD, caregiver independenting assisting) Pt Will Perform Toileting - Clothing Manipulation and hygiene: sitting/lateral leans;with min assist;with caregiver independent in assisting Additional ADL Goal #1: Pt will complete STS transfers from elevated surface with LRAD and MOD A with caregiver independent in assisting, SpO2 >90% on room air, 5/5 opportunities.  OT Frequency: Min 2X/week    Co-evaluation              AM-PAC OT "6 Clicks" Daily Activity     Outcome Measure Help from another person eating meals?: None Help from another person taking care of personal grooming?: A Little Help from another person toileting, which includes using toliet, bedpan, or urinal?: Total Help from another person bathing (including washing, rinsing, drying)?: A Lot Help from another person to put on  and taking off regular upper body clothing?: A Little Help from another person to put on and taking off regular lower body clothing?: A Lot 6 Click Score: 15   End of Session Equipment Utilized During Treatment: Gait belt;Oxygen;Rolling walker (2 wheels) Nurse Communication: Mobility status;Patient requests pain meds;Other (comment) (O2 sats)  Activity Tolerance: Patient tolerated treatment well Patient left: in chair;with call bell/phone within reach;with family/visitor present  OT Visit Diagnosis: Other abnormalities of gait and mobility (R26.89);History of falling (Z91.81);Muscle weakness (generalized) (M62.81);Pain Pain - Right/Left: Left Pain - part of body: Knee                Time: 8366-2947 OT Time Calculation (min): 41 min Charges:  OT General Charges $OT Visit: 1 Visit OT Evaluation $OT Eval Moderate Complexity: 1 Mod OT Treatments $Self Care/Home Management : 23-37 mins  Ardeth Perfect., MPH, MS, OTR/L ascom [phone] 11/19/21, 5:23 PM

## 2021-11-19 NOTE — Plan of Care (Signed)
°  Problem: Education: °Goal: Ability to demonstrate management of disease process will improve °Outcome: Progressing °Goal: Ability to verbalize understanding of medication therapies will improve °Outcome: Progressing °Goal: Individualized Educational Video(s) °Outcome: Progressing °  °Problem: Activity: °Goal: Capacity to carry out activities will improve °Outcome: Progressing °  °Problem: Cardiac: °Goal: Ability to achieve and maintain adequate cardiopulmonary perfusion will improve °Outcome: Progressing °  °Problem: Activity: °Goal: Ability to tolerate increased activity will improve °Outcome: Progressing °  °Problem: Clinical Measurements: °Goal: Ability to maintain a body temperature in the normal range will improve °Outcome: Progressing °  °Problem: Respiratory: °Goal: Ability to maintain adequate ventilation will improve °Outcome: Progressing °Goal: Ability to maintain a clear airway will improve °Outcome: Progressing °  °Problem: Education: °Goal: Knowledge of General Education information will improve °Description: Including pain rating scale, medication(s)/side effects and non-pharmacologic comfort measures °Outcome: Progressing °  °Problem: Health Behavior/Discharge Planning: °Goal: Ability to manage health-related needs will improve °Outcome: Progressing °  °Problem: Clinical Measurements: °Goal: Ability to maintain clinical measurements within normal limits will improve °Outcome: Progressing °Goal: Will remain free from infection °Outcome: Progressing °Goal: Diagnostic test results will improve °Outcome: Progressing °Goal: Respiratory complications will improve °Outcome: Progressing °Goal: Cardiovascular complication will be avoided °Outcome: Progressing °  °Problem: Activity: °Goal: Risk for activity intolerance will decrease °Outcome: Progressing °  °Problem: Nutrition: °Goal: Adequate nutrition will be maintained °Outcome: Progressing °  °Problem: Coping: °Goal: Level of anxiety will  decrease °Outcome: Progressing °  °Problem: Elimination: °Goal: Will not experience complications related to bowel motility °Outcome: Progressing °Goal: Will not experience complications related to urinary retention °Outcome: Progressing °  °Problem: Pain Managment: °Goal: General experience of comfort will improve °Outcome: Progressing °  °Problem: Safety: °Goal: Ability to remain free from injury will improve °Outcome: Progressing °  °Problem: Skin Integrity: °Goal: Risk for impaired skin integrity will decrease °Outcome: Progressing °  °

## 2021-11-20 DIAGNOSIS — I5033 Acute on chronic diastolic (congestive) heart failure: Secondary | ICD-10-CM | POA: Diagnosis not present

## 2021-11-20 MED ORDER — DICLOFENAC SODIUM 1 % EX GEL: 2.0000 g | Freq: Three times a day (TID) | CUTANEOUS | 0 refills | Status: DC

## 2021-11-20 MED ORDER — APIXABAN 5 MG PO TABS: 5.0000 mg | ORAL_TABLET | Freq: Two times a day (BID) | ORAL | 3 refills | Status: DC

## 2021-11-20 MED ORDER — CALCIUM CARBONATE ANTACID 500 MG PO CHEW: 200.0000 mg | CHEWABLE_TABLET | Freq: Two times a day (BID) | ORAL | Status: DC | PRN

## 2021-11-20 MED ORDER — FUROSEMIDE 20 MG PO TABS: 20.0000 mg | ORAL_TABLET | Freq: Every day | ORAL | 1 refills | Status: DC

## 2021-11-20 MED ORDER — CALCIUM CARBONATE ANTACID 500 MG PO CHEW
1.0000 | CHEWABLE_TABLET | Freq: Two times a day (BID) | ORAL | Status: DC | PRN
  Administered 2021-11-20: 200 mg via ORAL
  Filled 2021-11-20: qty 1

## 2021-11-20 MED ORDER — FUROSEMIDE 40 MG PO TABS: ORAL_TABLET | ORAL | 1 refills | Status: DC

## 2021-11-20 NOTE — Progress Notes (Signed)
[facility] at [place] NAME: [name]    MR#:  [id number]  DATE OF BIRTH:  04-Jan-1929  SUBJECTIVE:   No family at bedside. Patient sitting out in the chair  Patient out in the chair work with physical therapy. Daughter and son-in-law at bedside. Overall appears best at baseline. Lower extremity edema improving. VITALS:  Blood pressure 130/63, pulse 78, temperature 98.3 F (36.8 C), temperature source Oral, resp. rate 19, height '5\' 3"'$  (1.6 m), weight 71.5 kg, SpO2 97 %.  PHYSICAL EXAMINATION:   GENERAL:  88 y.o.-year-old patient lying in the bed with no acute distress. weak LUNGS: Normal breath sounds bilaterally, no wheezing, rales, rhonchi.  CARDIOVASCULAR: S1, S2 normal. No murmurs, rubs, or gallops.  ABDOMEN: Soft, nontender, nondistended. Bowel sounds present.  EXTREMITIES: bilateral chronic edema, DJD+ NEUROLOGIC: nonfocal  patient is alert and awake SKIN: No obvious rash, lesion, or ulcer.   LABORATORY PANEL:  CBC Recent Labs  Lab 11/18/21 0427  WBC 8.3  HGB 11.5*  HCT 35.5*  PLT 349     Chemistries  Recent Labs  Lab 11/16/21 2036 11/16/21 2140 11/17/21 0356 11/19/21 0443  NA 134*  --    < > 137  K 3.9  --    < > 3.7  CL 100  --    < > 97*  CO2 24  --    < > 29  GLUCOSE 149*  --    < > 124*  BUN 37*  --    < > 38*  CREATININE 1.47*  --    < > 1.14*  CALCIUM 8.7*  --    < > 9.8  MG  --  2.0  --   --   AST 13*  --   --   --   ALT 9  --   --   --   ALKPHOS 47  --   --   --   BILITOT 1.5*  --   --   --    < > = values in this interval not displayed.     RADIOLOGY:  No results found.  Assessment and Plan [name] is a 88 y.o. female with medical history significant for Chronic diastolic heart failure, hypertension, PAD, chronic back pain, who presents to the ED with a several day history of dyspnea on exertion.  Patient has a several month history of lower extremity edema, and has been on increasing  doses of Lasix as an outpatient, however her symptoms have not improved and continues to have worsening shortness of breath.    [name] is a 88 y.o. female with medical history significant for Chronic diastolic heart failure, hypertension, PAD, chronic back pain, who presents to the ED with a several day history of dyspnea on exertion.  Patient has a several month history of lower extremity edema, and has been on increasing doses of Lasix as an outpatient, however her symptoms have not improved and continues to have worsening shortness of breath  Chest x-ray consistent with CHF. No fever or productive cough at home. Patient does not have dysuria frequency urgency. This was confirmed with patient's daughter.  Acute on chronic diastolic CHF (congestive heart failure) ([place]) --Potential triggers include A-fib not previously diagnosed, symptomatic anemia --IV Lasix --Continue to trend troponins.   --Hold amlodipine --Daily weights with intake and output monitoring --cont BB, irbesartan -- diuresis in well. Creatinine trending down. Will change to PO Lasix  from today. Lasix 40 mg daily for three days and then 20 mg daily. Discussed with daughter if she sees increased shortness of breath or worsening leg edema then give 40 mg on that day. -- Currently on room air   Acute respiratory failure with hypoxia ([place]) --Secondary to CHF -O2 sat was in the low 80s with EMS requiring 6 L-->2 liters-->on RA   Acute anemia Hemoglobin of 10.5, down from 13.5 a month ago Per dter S iron has been followed by PCP dr Nicki Reaper. Last month s iron was 153 --s iron on higher side as out pt labs --I will hold off on any further w/u and have iron studies be done as out pt by dr Nicki Reaper  New onset atrial fibrillation ([place]) --Rate controlled --Continue metoprolol --started on  eliquis by cardiology after d/w dters  Elevated temperature --pt afebrile and wbc normal --no dysuria--d/c abxs --no PNA on CXR --BC  neg   AKI (acute kidney injury) ([place]) Creatinine 1.47 up from baseline of 1.01, suspect secondary to Lasix uptitration Continue to monitor and avoid nephrotoxins --creat down to 1.12   Chronic back pain Continue oxycodone and gabapentin   Hypertension Blood pressure slightly elevated Continue metoprolol and losartan     discharged to home with home health. Discharge plan discussed with daughter at bedside.  DVT prophylaxis: eliquis   Consults: Cardiology,[facility]   Advance Care Planning:   Code Status: DNR   Family Communication: Daughter in the room EDD 11/20/21 Level of care: Progressive TOTAL TIME TAKING CARE OF THIS PATIENT:35 minutes.  >50% time spent on counselling and coordination of care  Note: This dictation was prepared with Dragon dictation along with smaller phrase technology. Any transcriptional errors that result from this process are unintentional.  Fritzi Mandes M.D    [facility]   CC: Primary care physician; [name], MD

## 2021-11-20 NOTE — TOC Transition Note (Addendum)
Transition of Care [facility]) - CM/SW Discharge Note   Patient Details  Name: [name] MRN: [id number] Date of Birth: 1928-11-23  Transition of [facility]) CM/SW Contact:  Harriet Masson, RN Phone Number: [phone] 11/20/2021, 11:58 AM   Clinical Narrative:    Pt discharging today with HHPT with [facility]) accepted and [facility]) DME for 3-1 will be delivered to the pt's home address as requested. [facility] called for transportation and medical necessity provided to floor for EMS. Spoke with daughter [name] concerning all arrangements who indicated good support system, ability to afford medications and transportation available for all post-op medical appointments. No other needs requested at this time.  TOC remains available or any additional needs.         Patient Goals and CMS Choice        Discharge Placement                       Discharge Plan and Services                                     Social Determinants of Health (SDOH) Interventions     Readmission Risk Interventions     No data to display

## 2021-11-20 NOTE — Discharge Summary (Signed)
Physician Discharge Summary   Patient: LUVERNE FARONE MRN: [id number] DOB: [birth year]  Admit date:     11/16/2021  Discharge date: 11/20/2021  Discharge Physician: Fritzi Mandes   PCP: [name], MD   Recommendations at discharge:    F/u PCP in 1-2 weeks  Discharge Diagnoses: Principal Problem:   Acute on chronic diastolic CHF (congestive heart failure) ([place]) Active Problems:   Acute respiratory failure with hypoxia ([place])   New onset atrial fibrillation (HCC)   Acute anemia   AKI (acute kidney injury) ([place])   Elevated temperature   Hypertension   Chronic back pain   Hospital Course:  SALLE BRANDLE is a 88 y.o. female with medical history significant for Chronic diastolic heart failure, hypertension, PAD, chronic back pain, who presents to the ED with a several day history of dyspnea on exertion.  Patient has a several month history of lower extremity edema, and has been on increasing doses of Lasix as an outpatient, however her symptoms have not improved and continues to have worsening shortness of breath.     MARSHELL RIEGER is a 88 y.o. female with medical history significant for Chronic diastolic heart failure, hypertension, PAD, chronic back pain, who presents to the ED with a several day history of dyspnea on exertion.  Patient has a several month history of lower extremity edema, and has been on increasing doses of Lasix as an outpatient, however her symptoms have not improved and continues to have worsening shortness of breath   Chest x-ray consistent with CHF. No fever or productive cough at home. Patient does not have dysuria frequency urgency. This was confirmed with patient's daughter.   Acute on chronic diastolic CHF (congestive heart failure) ([place]) --Potential triggers include A-fib not previously diagnosed, symptomatic anemia --IV Lasix --Continue to trend troponins.   --Hold amlodipine --Daily weights with intake and output monitoring --cont BB, irbesartan --  diuresis in well. Creatinine trending down. Will change to PO Lasix from today. Lasix 40 mg daily for three days and then 20 mg daily. Discussed with daughter if she sees increased shortness of breath or worsening leg edema then give 40 mg on that day. -- Currently on room air   Acute respiratory failure with hypoxia ([place]) --Secondary to CHF -O2 sat was in the low 80s with EMS requiring 6 L-->2 liters-->on RA   Acute anemia Hemoglobin of 10.5, down from 13.5 a month ago Per dter S iron has been followed by PCP dr Nicki Reaper. Last month s iron was 153 --s iron on higher side as out pt labs --I will hold off on any further w/u and have iron studies be done as out pt by dr Nicki Reaper   New onset atrial fibrillation ([place]) --Rate controlled --Continue metoprolol --started on  eliquis by cardiology after d/w dters   Elevated temperature --pt afebrile and wbc normal --no dysuria--d/c abxs --no PNA on CXR --BC neg   AKI (acute kidney injury) ([place]) Creatinine 1.47 up from baseline of 1.01, suspect secondary to Lasix uptitration Continue to monitor and avoid nephrotoxins --creat down to 1.12   Chronic back pain Continue oxycodone and gabapentin   Hypertension Blood pressure slightly elevated Continue metoprolol and losartan     discharged to home with home health. Discharge plan discussed with daughter at bedside.  DVT prophylaxis: eliquis   Consults: Cardiology,[facility]   Advance Care Planning:   Code Status: DNR   Family Communication: Daughter in the room EDD 11/20/21 Level of care: Progressive  Disposition: Home health Diet recommendation:  Discharge Diet Orders (From admission, onward)     Start     Ordered   11/20/21 0000  Diet - low sodium heart healthy        11/20/21 1126           Cardiac diet DISCHARGE MEDICATION: Allergies as of 11/20/2021       Reactions   Fentanyl Other (See Comments)   confusion        Medication List     STOP taking these  medications    amLODipine 5 MG tablet Commonly known as: NORVASC   lisinopril-hydrochlorothiazide 10-12.5 MG tablet Commonly known as: ZESTORETIC   losartan 50 MG tablet Commonly known as: COZAAR   metoCLOPramide 5 MG tablet Commonly known as: REGLAN   traMADol-acetaminophen 37.5-325 MG tablet Commonly known as: ULTRACET       TAKE these medications    albuterol 108 (90 Base) MCG/ACT inhaler Commonly known as: VENTOLIN HFA TAKE 2 PUFFS BY MOUTH EVERY 6 HOURS AS NEEDED FOR WHEEZE OR SHORTNESS OF BREATH   apixaban 5 MG Tabs tablet Commonly known as: ELIQUIS Take 1 tablet (5 mg total) by mouth 2 (two) times daily.   calcium carbonate 1500 (600 Ca) MG Tabs tablet Commonly known as: OSCAL Take 600 mg of elemental calcium by mouth 2 (two) times daily with a meal.   diclofenac Sodium 1 % Gel Commonly known as: VOLTAREN Apply 2 g topically 3 (three) times daily.   docusate sodium 100 MG capsule Commonly known as: COLACE Take 100 mg by mouth 2 (two) times daily.   fexofenadine 60 MG tablet Commonly known as: ALLEGRA TAKE 1 TABLET EVERY DAY   furosemide 40 MG tablet Commonly known as: LASIX Take 40 mg qd for 3 days and then prn for increased leg swelling or weight gain Start taking on: November 21, 2021 What changed: You were already taking a medication with the same name, and this prescription was added. Make sure you understand how and when to take each.   furosemide 20 MG tablet Commonly known as: LASIX Take 1 tablet (20 mg total) by mouth daily. Start taking on: November 24, 2021 What changed: These instructions start on November 24, 2021. If you are unsure what to do until then, ask your doctor or other care provider.   gabapentin 100 MG capsule Commonly known as: NEURONTIN TAKE 2 CAPSULES AT BEDTIME   irbesartan 300 MG tablet Commonly known as: AVAPRO TAKE 1 TABLET EVERY DAY   meclizine 12.5 MG tablet Commonly known as: ANTIVERT Take 1 tablet (12.5 mg  total) by mouth 2 (two) times daily as needed for dizziness.   metoprolol succinate 25 MG 24 hr tablet Commonly known as: TOPROL-XL TAKE 1 TABLET TWICE DAILY   mometasone 0.1 % cream Commonly known as: ELOCON Apply 1 application topically See admin instructions. Apply at bedtime to affected areas under breast and lower abdominal folder on tuesdays, thursdays, and Saturdays weekly.   ondansetron 4 MG disintegrating tablet Commonly known as: Zofran ODT Take 1 tablet (4 mg total) by mouth 2 (two) times daily as needed for nausea or vomiting.   optichamber diamond Misc   oxyCODONE 5 MG immediate release tablet Commonly known as: Oxy IR/ROXICODONE Take 5 mg by mouth 3 (three) times daily.   simvastatin 10 MG tablet Commonly known as: ZOCOR TAKE 1 TABLET (10 MG TOTAL) AT BEDTIME.   traMADol 50 MG tablet Commonly known as: ULTRAM Take 1 tablet by mouth  at bedtime as needed.   Vitamin D3 50 MCG (2000 UT) capsule Take 2,000 Units by mouth daily.               Durable Medical Equipment  (From admission, onward)           Start     Ordered   11/20/21 1130  For home use only DME 3 n 1  Once        11/20/21 1129            Follow-up Information     Paraschos, Alexander, MD. Go in 1 week(s).   Specialty: Cardiology Contact information: [facility] [place] [address] [phone]         [name], MD. Schedule an appointment as soon as possible for a visit in 1 week(s).   Specialty: Internal Medicine Why: chf f/u Contact information: [address] [place] [address] [phone]                Discharge Exam: Danley Danker Weights   11/16/21 2007 11/17/21 1758 11/18/21 0429  Weight: 72.6 kg 70.9 kg 71.5 kg     Condition at discharge: fair  The results of significant diagnostics from this hospitalization (including imaging, microbiology, ancillary and laboratory) are listed  below for reference.   Imaging Studies: ECHOCARDIOGRAM COMPLETE  Result Date: 11/17/2021    ECHOCARDIOGRAM REPORT   Patient Name:   KERRILYN AZBILL [facility] Date of Exam: 11/17/2021 Medical Rec #:  [id number]     Height:       64.0 in Accession #:    [id number]    Weight:       160.0 lb Date of Birth:  05/14/1928    BSA:          1.779 m Patient Age:    88 years      BP:           172/86 mmHg Patient Gender: F             HR:           80 bpm. Exam Location:  [facility] Procedure: 2D Echo, Cardiac Doppler and Color Doppler Indications:     CHF-acute diastolic K93.26  History:         Patient has prior history of Echocardiogram examinations, most                  recent 04/23/2020. Risk Factors:Hypertension.  Sonographer:     Sherrie Sport Referring Phys:  [id number] Athena Masse Diagnosing Phys: Isaias Cowman MD IMPRESSIONS  1. Left ventricular ejection fraction, by estimation, is 50 to 55%. The left ventricle has low normal function. The left ventricle has no regional wall motion abnormalities. There is mild left ventricular hypertrophy. Left ventricular diastolic parameters were normal.  2. Right ventricular systolic function is normal. The right ventricular size is normal.  3. The mitral valve is normal in structure. Mild to moderate mitral valve regurgitation. No evidence of mitral stenosis.  4. Tricuspid valve regurgitation is mild to moderate.  5. The aortic valve is normal in structure. Aortic valve regurgitation is mild to moderate. No aortic stenosis is present.  6. The inferior vena cava is normal in size with greater than 50% respiratory variability, suggesting right atrial pressure of 3 mmHg. FINDINGS  Left Ventricle: Left ventricular ejection fraction, by estimation, is 50 to 55%. The left ventricle has low normal function. The left ventricle has no regional wall  motion abnormalities. The left ventricular internal cavity size was normal in size. There is mild left ventricular hypertrophy. Left ventricular diastolic  parameters were normal. Right Ventricle: The right ventricular size is normal. No increase in right ventricular wall thickness. Right ventricular systolic function is normal. Left Atrium: Left atrial size was normal in size. Right Atrium: Right atrial size was normal in size. Pericardium: There is no evidence of pericardial effusion. Mitral Valve: The mitral valve is normal in structure. Mild to moderate mitral valve regurgitation. No evidence of mitral valve stenosis. Tricuspid Valve: The tricuspid valve is normal in structure. Tricuspid valve regurgitation is mild to moderate. No evidence of tricuspid stenosis. Aortic Valve: The aortic valve is normal in structure. Aortic valve regurgitation is mild to moderate. No aortic stenosis is present. Aortic valve mean gradient measures 4.0 mmHg. Aortic valve peak gradient measures 8.1 mmHg. Aortic valve area, by VTI measures 2.54 cm. Pulmonic Valve: The pulmonic valve was normal in structure. Pulmonic valve regurgitation is not visualized. No evidence of pulmonic stenosis. Aorta: The aortic root is normal in size and structure. Venous: The inferior vena cava is normal in size with greater than 50% respiratory variability, suggesting right atrial pressure of 3 mmHg. IAS/Shunts: No atrial level shunt detected by color flow Doppler.  LEFT VENTRICLE PLAX 2D LVIDd:         3.60 cm   Diastology LVIDs:         2.60 cm   LV e' medial:    18.10 cm/s LV PW:         1.20 cm   LV E/e' medial:  7.8 LV IVS:        1.20 cm   LV e' lateral:   6.96 cm/s LVOT diam:     2.00 cm   LV E/e' lateral: 20.3 LV SV:         65 LV SV Index:   37 LVOT Area:     3.14 cm  RIGHT VENTRICLE RV Basal diam:  2.80 cm RV Mid diam:    2.40 cm RV S prime:     13.90 cm/s TAPSE (M-mode): 3.4 cm LEFT ATRIUM             Index        RIGHT ATRIUM           Index LA diam:        4.30 cm 2.42 cm/m   RA Area:     12.00 cm LA Vol (A2C):   52.9 ml 29.73 ml/m  RA Volume:   22.50 ml  12.64 ml/m LA Vol (A4C):   52.7  ml 29.62 ml/m LA Biplane Vol: 55.2 ml 31.02 ml/m  AORTIC VALVE AV Area (Vmax):    2.32 cm AV Area (Vmean):   2.37 cm AV Area (VTI):     2.54 cm AV Vmax:           142.00 cm/s AV Vmean:          93.600 cm/s AV VTI:            0.256 m AV Peak Grad:      8.1 mmHg AV Mean Grad:      4.0 mmHg LVOT Vmax:         105.00 cm/s LVOT Vmean:        70.700 cm/s LVOT VTI:          0.207 m LVOT/AV VTI ratio: 0.81  AORTA Ao Root diam: 2.70 cm MITRAL VALVE  TRICUSPID VALVE MV Area (PHT): 4.17 cm     TR Peak grad:   49.3 mmHg MV Decel Time: 182 msec     TR Vmax:        351.00 cm/s MV E velocity: 141.00 cm/s MV A velocity: 50.80 cm/s   SHUNTS MV E/A ratio:  2.78         Systemic VTI:  0.21 m                             Systemic Diam: 2.00 cm Isaias Cowman MD Electronically signed by Isaias Cowman MD Signature Date/Time: 11/17/2021/1:19:05 PM    Final    DG Chest Port 1 View  Result Date: 11/16/2021 CLINICAL DATA:  Shortness of breath. EXAM: PORTABLE CHEST 1 VIEW COMPARISON:  April 16, 2020. FINDINGS: Stable cardiomegaly with mild central pulmonary vascular congestion. Probable mild bilateral perihilar and basilar pulmonary edema is noted. Small pleural effusions are noted. Bony thorax is unremarkable. IMPRESSION: Stable cardiomegaly with mild central pulmonary vascular congestion and probable mild bilateral pulmonary edema. Small pleural effusions. Electronically Signed   By: Marijo Conception M.D.   On: 11/16/2021 20:37    Microbiology: Results for orders placed or performed during the hospital encounter of 11/16/21  Urine Culture     Status: Abnormal   Collection Time: 11/16/21  8:35 PM   Specimen: In/Out Cath Urine  Result Value Ref Range Status   Specimen Description   Final    IN/OUT CATH URINE Performed at [facility], [address] [place], [place] [address]    Special Requests   Final    NONE Performed at [facility], [address] [place],   [address]    Culture MULTIPLE SPECIES PRESENT, SUGGEST RECOLLECTION (A)  Final   Report Status 11/18/2021 FINAL  Final  Resp Panel by RT-PCR (Flu A&B, Covid) Anterior Nasal Swab     Status: None   Collection Time: 11/16/21  8:36 PM   Specimen: Anterior Nasal Swab  Result Value Ref Range Status   SARS Coronavirus 2 by RT PCR NEGATIVE NEGATIVE Final    Comment: (NOTE) SARS-CoV-2 target nucleic acids are NOT DETECTED.  The SARS-CoV-2 RNA is generally detectable in upper respiratory specimens during the acute phase of infection. The lowest concentration of SARS-CoV-2 viral copies this assay can detect is 138 copies/mL. A negative result does not preclude SARS-Cov-2 infection and should not be used as the sole basis for treatment or other patient management decisions. A negative result may occur with  improper specimen collection/handling, submission of specimen other than nasopharyngeal swab, presence of viral mutation(s) within the areas targeted by this assay, and inadequate number of viral copies(<138 copies/mL). A negative result must be combined with clinical observations, patient history, and epidemiological information. The expected result is Negative.  Fact Sheet for Patients:  EntrepreneurPulse.com.au  Fact Sheet for Healthcare Providers:  IncredibleEmployment.be  This test is no t yet approved or cleared by the [place] FDA and  has been authorized for detection and/or diagnosis of SARS-CoV-2 by FDA under an Emergency Use Authorization (EUA). This EUA will remain  in effect (meaning this test can be used) for the duration of the COVID-19 declaration under Section 564(b)(1) of the Act, 21 U.S.C.section 360bbb-3(b)(1), unless the authorization is terminated  or revoked sooner.       Influenza A by PCR NEGATIVE NEGATIVE Final   Influenza B by PCR NEGATIVE NEGATIVE Final  Comment: (NOTE) The Xpert Xpress SARS-CoV-2/FLU/RSV plus assay  is intended as an aid in the diagnosis of influenza from Nasopharyngeal swab specimens and should not be used as a sole basis for treatment. Nasal washings and aspirates are unacceptable for Xpert Xpress SARS-CoV-2/FLU/RSV testing.  Fact Sheet for Patients: EntrepreneurPulse.com.au  Fact Sheet for Healthcare Providers: IncredibleEmployment.be  This test is not yet approved or cleared by the [place] FDA and has been authorized for detection and/or diagnosis of SARS-CoV-2 by FDA under an Emergency Use Authorization (EUA). This EUA will remain in effect (meaning this test can be used) for the duration of the COVID-19 declaration under Section 564(b)(1) of the Act, 21 U.S.C. section 360bbb-3(b)(1), unless the authorization is terminated or revoked.  Performed at [facility], [address] [place], [place] [address]   Blood Culture (routine x 2)     Status: None (Preliminary result)   Collection Time: 11/16/21  8:36 PM   Specimen: BLOOD  Result Value Ref Range Status   Specimen Description BLOOD BLOOD LEFT ARM  Final   Special Requests   Final    BOTTLES DRAWN AEROBIC AND ANAEROBIC Blood Culture results may not be optimal due to an inadequate volume of blood received in culture bottles   Culture   Final    NO GROWTH 4 DAYS Performed at [facility], [address] [place], [place] [address]    Report Status PENDING  Incomplete  Blood Culture (routine x 2)     Status: None (Preliminary result)   Collection Time: 11/16/21  8:36 PM   Specimen: BLOOD  Result Value Ref Range Status   Specimen Description BLOOD BLOOD RIGHT ARM  Final   Special Requests   Final    BOTTLES DRAWN AEROBIC AND ANAEROBIC Blood Culture results may not be optimal due to an inadequate volume of blood received in culture bottles   Culture   Final    NO GROWTH 4 DAYS Performed at [facility], [address] [place], [place] [address]     Report Status PENDING  Incomplete  Respiratory (~20 pathogens) panel by PCR     Status: None   Collection Time: 11/16/21 11:38 PM   Specimen: Nasopharyngeal Swab; Respiratory  Result Value Ref Range Status   Adenovirus NOT DETECTED NOT DETECTED Final   Coronavirus 229E NOT DETECTED NOT DETECTED Final    Comment: (NOTE) The Coronavirus on the Respiratory Panel, DOES NOT test for the novel  Coronavirus (2019 nCoV)    Coronavirus HKU1 NOT DETECTED NOT DETECTED Final   Coronavirus NL63 NOT DETECTED NOT DETECTED Final   Coronavirus OC43 NOT DETECTED NOT DETECTED Final   Metapneumovirus NOT DETECTED NOT DETECTED Final   Rhinovirus / Enterovirus NOT DETECTED NOT DETECTED Final   Influenza A NOT DETECTED NOT DETECTED Final   Influenza B NOT DETECTED NOT DETECTED Final   Parainfluenza Virus 1 NOT DETECTED NOT DETECTED Final   Parainfluenza Virus 2 NOT DETECTED NOT DETECTED Final   Parainfluenza Virus 3 NOT DETECTED NOT DETECTED Final   Parainfluenza Virus 4 NOT DETECTED NOT DETECTED Final   Respiratory Syncytial Virus NOT DETECTED NOT DETECTED Final   Bordetella pertussis NOT DETECTED NOT DETECTED Final   Bordetella Parapertussis NOT DETECTED NOT DETECTED Final   Chlamydophila pneumoniae NOT DETECTED NOT DETECTED Final   Mycoplasma pneumoniae NOT DETECTED NOT DETECTED Final    Comment: Performed at [facility], [place] [address] [place], [place] [address]    Labs: CBC: Recent  Labs  Lab 11/16/21 2036 11/17/21 0356 11/18/21 0427  WBC 9.9 9.7 8.3  NEUTROABS 7.9*  --   --   HGB 10.5* 10.5* 11.5*  HCT 33.8* 32.3* 35.5*  MCV 102.1* 98.8 95.9  PLT 283 315 017   Basic Metabolic Panel: Recent Labs  Lab 11/16/21 2036 11/16/21 2140 11/17/21 0356 11/18/21 0427 11/19/21 0443  NA 134*  --  138 137 137  K 3.9  --  3.8 3.8 3.7  CL 100  --  101 98 97*  CO2 24  --  '26 28 29  '$ GLUCOSE 149*  --  143* 118* 124*  BUN 37*  --  34* 26* 38*  CREATININE 1.47*  --  1.43* 1.12* 1.14*   CALCIUM 8.7*  --  8.9 9.2 9.8  MG  --  2.0  --   --   --    Liver Function Tests: Recent Labs  Lab 11/16/21 2036  AST 13*  ALT 9  ALKPHOS 47  BILITOT 1.5*  PROT 6.5  ALBUMIN 3.2*   CBG: No results for input(s): "GLUCAP" in the last 168 hours.  Discharge time spent: greater than 30 minutes.  Signed: Fritzi Mandes, MD [facility] 11/20/2021

## 2021-11-20 NOTE — Evaluation (Signed)
Physical Therapy Evaluation Patient Details Name: [name] MRN: [id number] DOB: 07/19/1928 Today's Date: 11/20/2021  History of Present Illness  [name] is a 31yoF who comes to [facility] on 10/[facility] c persistent SOB. LEE is chronic but recently worse. PMH: dCHF, HTN, PAD, colon cancer s/p resection x2, back pain. Workup also showing Hb down to 10.5. At baseline pt uses a 4WW for limited household distances of 40-60', required assist for all transfers.  Clinical Impression  Pt in chair on entry, awake, meal finished, agreeable to session and simply a delight. Family demos baseline techniques for assisting with transfers which are not optimal today given her progressed weakness. 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 Pt is not able to successful transfer to 4WW upon attempt, very close but still needs minA stabilization of 4WW. Weakness makes pt essentially nonambulatory today, hence pt will need considerable help with ADL mobility at DC, makes her a good candidate for high frequency PT services, but family/pt elect to chose DC to home, not interested in facility placement for STR. BSC, transport chair, and EMS transport will be needed to make this happen. Have discussed with MD.      Recommendations for follow up therapy are one component of a multi-disciplinary discharge planning process, led by the attending physician.  Recommendations may be updated based on patient status, additional functional criteria and insurance authorization.  Follow Up Recommendations Home health PT (would benefit from a higher requency of services, but family/pt elect for DC to home.)      Assistance Recommended at Discharge Intermittent Supervision/Assistance  Patient can return home with the following  Two people to help with walking and/or  transfers;A lot of help with bathing/dressing/bathroom;Assist for transportation;Help with stairs or ramp for entrance;Assistance with cooking/housework    Equipment Recommendations BSC/3in1 (transport chair;)  Recommendations for Other Services       Functional Status Assessment Patient has had a recent decline in their functional status and demonstrates the ability to make significant improvements in function in a reasonable and predictable amount of time.     Precautions / Restrictions Precautions Precautions: Fall Restrictions Weight Bearing Restrictions: No      Mobility  Bed Mobility               General bed mobility comments: NT, up in recliner (never sleeps in a bed at baseline)    Transfers Overall transfer level: Needs assistance Equipment used: Rollator (4 wheels), None (anterior bed rail) Transfers: Sit to/from Stand Sit to Stand: Min assist           General transfer comment: cues to optimize feet and trunk, pt pulls self into stnading wiht minA at pelvis    Ambulation/Gait Ambulation/Gait assistance:  (Can perform left marching, but left knee pain precludes left FWB for Right marching)                Stairs            Wheelchair Mobility    Modified Rankin (Stroke Patients Only)       Balance                                             Pertinent Vitals/Pain  Pain Assessment Pain Assessment: No/denies pain    Home Living Family/patient expects to be discharged to:: Private residence Living Arrangements: Spouse/significant other Available Help at Discharge: Family;Available 24 hours/day;Available PRN/intermittently (4 DTRs share care responsibility) Type of Home: House Home Access: Stairs to enter;Ramped entrance Entrance Stairs-Rails:  (a post) Entrance Stairs-Number of Steps: 1   Home Layout: One level Home Equipment: Rollator (4 wheels);Shower seat;Hand held shower head;Other (comment) Additional  Comments: Pt sleeps in a lift recliner and spends majority of her time in lift recliner, uses it to stand her up    Prior Function Prior Level of Function : Needs assist;History of Falls (last six months)             Mobility Comments: Pt requires lift recliner and/or family to assist with transfers, using rollator for walking short distances (was previously able to walk 50-60' at a time but very recently has only been able to walk 10-12' with assist from family). Per family, a w/c will not fit through doorways at home. ADLs Comments: Pt requires assist for LB dressing, bathing from seated position, meals, housekeeping, driving, and med mgt. Daughters come nearly daily to provide meals and assist with housekeeping tasks and transportation. Great family support available.     Hand Dominance        Extremity/Trunk Assessment                Communication   Communication: HOH  Cognition Arousal/Alertness: Awake/alert Behavior During Therapy: WFL for tasks assessed/performed Overall Cognitive Status: Within Functional Limits for tasks assessed                                          General Comments      Exercises General Exercises - Lower Extremity Long Arc Quad: AAROM, AROM, Left, 15 reps, Seated   Assessment/Plan    PT Assessment Patient needs continued PT services  PT Problem List Decreased strength;Decreased range of motion;Decreased activity tolerance;Decreased balance;Decreased mobility;Decreased knowledge of use of DME;Decreased knowledge of precautions       PT Treatment Interventions DME instruction;Balance training;Gait training;Stair training;Functional mobility training;Therapeutic activities;Therapeutic exercise;Patient/family education    PT Goals (Current goals can be found in the Care Plan section)  Acute Rehab PT Goals Patient Stated Goal: regain ability to perform household AMB PT Goal Formulation: With patient Time For Goal  Achievement: 12/04/21 Potential to Achieve Goals: Good    Frequency Min 2X/week     Co-evaluation               AM-PAC PT "6 Clicks" Mobility  Outcome Measure Help needed turning from your back to your side while in a flat bed without using bedrails?: Total Help needed moving from lying on your back to sitting on the side of a flat bed without using bedrails?: Total Help needed moving to and from a bed to a chair (including a wheelchair)?: A Lot Help needed standing up from a chair using your arms (e.g., wheelchair or bedside chair)?: A Lot Help needed to walk in hospital room?: Total Help needed climbing 3-5 steps with a railing? : Total 6 Click Score: 8    End of Session   Activity Tolerance: Patient tolerated treatment well;Patient limited by fatigue;Patient limited by pain Patient left: in chair;with family/visitor present;with call bell/phone within reach Nurse Communication: Mobility status PT Visit Diagnosis: Unsteadiness on feet (R26.81);Difficulty in walking, not  elsewhere classified (R26.2);Muscle weakness (generalized) (M62.81)    Time: 0172-0910 PT Time Calculation (min) (ACUTE ONLY): 31 min   Charges:   PT Evaluation $PT Eval Moderate Complexity: 1 Mod PT Treatments $Therapeutic Exercise: 8-22 mins       9:56 AM, 11/20/21 Etta Grandchild, PT, DPT Physical Therapist - [facility] [place]  [phone] ([place])    Nima Kemppainen C 11/20/2021, 9:51 AM

## 2021-11-21 LAB — CULTURE, BLOOD (ROUTINE X 2)
Culture: NO GROWTH
Culture: NO GROWTH

## 2021-11-22 ENCOUNTER — Telehealth: Payer: Self-pay

## 2021-11-22 ENCOUNTER — Other Ambulatory Visit: Payer: Self-pay | Admitting: Internal Medicine

## 2021-11-22 ENCOUNTER — Encounter: Payer: Self-pay | Admitting: Internal Medicine

## 2021-11-22 NOTE — Telephone Encounter (Signed)
Please call and confirm she is doing ok.  See how blood pressure is doing.  Have them monitor her blood pressure and see how she is doing on her current regimen.  Will need hospital follow up with me.

## 2021-11-22 NOTE — Telephone Encounter (Signed)
Given blood pressure ok, continue current medication as she is doing.  We will follow and add back if needed.  Also, agree with hospital follow.  After appt scheduled and pt notified, please forward to [facility] for TCM call.

## 2021-11-22 NOTE — Telephone Encounter (Signed)
Spoke with pt's husband and he stated that pt is doing very well since coming home from the hospital. She is regaining her strength and bp this morning was 116/60. Husband asked that we schedule the appt with their daughter [name]. I was unable to get in touch with her but went ahead and scheduled the appt to hold it and will try calling her again tomorrow.

## 2021-11-22 NOTE — Telephone Encounter (Signed)
Transition Care Management Unsuccessful Follow-up Telephone Call  Date of discharge and from where:  11/20/21 [facility]  Attempts:  1st Attempt  Reason for unsuccessful TCM follow-up call:  Unable to reach patient. Will follow.

## 2021-11-23 NOTE — Telephone Encounter (Signed)
Spoke with [name] this morning and she has already scheduled the pt for a HFU on 12/06/2021. [name] wanted me to let you know that they did not stop any medication like they were advised to do by the hospital doctor. Pt is taking the same medications she was before she went in the hospital.

## 2021-11-23 NOTE — Telephone Encounter (Signed)
Transition Care Management Unsuccessful Follow-up Telephone Call  Date of discharge and from where:  11/20/21 [facility]  Attempts:  2nd Attempt  Reason for unsuccessful TCM follow-up call:  Left voice message. Previously scheduled 12/06/21 @ 11:00. Left a message to reschedule sooner if possible. Tentatively on hold for 11/30/21 @ 11:00. Will follow.    Maximum Reiland O'Brien-Blaney LPN, [place], [place] Direct dial [phone] ??

## 2021-11-24 NOTE — Telephone Encounter (Signed)
Transition Care Management Follow-up Telephone Call Date of discharge and from where: 11/20/21 [facility].  How have you been since you were released from the hospital? Information received from husband, HIPAA compliant. Improving more with each day and getting stronger. Notes doing very well since discharge. Monitoring BP; last reading 120/60. Denies worsening of shortness of breath, fever, productive cough, pain and all other harmful symptoms.  Any questions or concerns? No  Items Reviewed: Did the pt receive and understand the discharge instructions provided? Yes  Medications obtained and verified? Yes , husband manages. Reports he has clarified medications with [facility] pharmacy and will address any medications changes needed with PCP before appointment. Taking all medications as previously scheduled and without issues.    Other? Husband reports no longer on oxygen and her breathing is good. Pacing self with all activity.  Any new allergies since your discharge? No  Dietary orders reviewed? Yes, low sodium heart healthy. Do you have support at home? Yes   Home Care and Equipment/Supplies: Were home health services ordered? Yes, in progress. HHRN/PT/OT at a total of 3 days weekly.  Functional Questionnaire: (I = Independent and D = Dependent) ADLs: Family assist  Meal Prep- I  Eating- I  Maintaining continence- Managed with depend brief.  Transferring/Ambulation- Walker and lift chair  Managing Meds- Husband assist  Follow up appointments reviewed:  PCP Hospital f/u appt confirmed? Yes  Scheduled to see PCP on 12/06/21 @ 11:00. Patient is flexible to reschedule as needed. Please call  daughter Warren Lacy [phone] with all scheduling needs.   [facility] f/u appt confirmed? Yes  Scheduled to see Cardiology on 12/06/21 per husband.  Are transportation arrangements needed? No  If their condition worsens, is the pt aware to call PCP or go to the Emergency Dept.? Yes Was the patient  provided with contact information for the PCP's office or ED? Yes Was to pt encouraged to call back with questions or concerns? Yes

## 2021-11-24 NOTE — Telephone Encounter (Signed)
For now just go ahead and schedule her for 12/03/21 at 12:00.  Thanks.

## 2021-11-25 ENCOUNTER — Ambulatory Visit: Payer: Medicare HMO | Admitting: Family

## 2021-11-26 NOTE — Telephone Encounter (Signed)
Noted. Thanks.

## 2021-11-29 ENCOUNTER — Other Ambulatory Visit: Payer: Self-pay | Admitting: Internal Medicine

## 2021-11-29 DIAGNOSIS — R609 Edema, unspecified: Secondary | ICD-10-CM | POA: Diagnosis not present

## 2021-11-29 DIAGNOSIS — I10 Essential (primary) hypertension: Secondary | ICD-10-CM | POA: Diagnosis not present

## 2021-11-29 DIAGNOSIS — R0609 Other forms of dyspnea: Secondary | ICD-10-CM | POA: Diagnosis not present

## 2021-11-29 DIAGNOSIS — I5033 Acute on chronic diastolic (congestive) heart failure: Secondary | ICD-10-CM | POA: Diagnosis not present

## 2021-11-29 DIAGNOSIS — I739 Peripheral vascular disease, unspecified: Secondary | ICD-10-CM | POA: Diagnosis not present

## 2021-11-29 DIAGNOSIS — E7800 Pure hypercholesterolemia, unspecified: Secondary | ICD-10-CM | POA: Diagnosis not present

## 2021-11-29 DIAGNOSIS — I5032 Chronic diastolic (congestive) heart failure: Secondary | ICD-10-CM | POA: Diagnosis not present

## 2021-11-29 DIAGNOSIS — I480 Paroxysmal atrial fibrillation: Secondary | ICD-10-CM | POA: Diagnosis not present

## 2021-11-30 ENCOUNTER — Inpatient Hospital Stay: Payer: Medicare HMO | Admitting: Internal Medicine

## 2021-12-01 LAB — BLOOD GAS, VENOUS
Acid-Base Excess: 1.9 mmol/L (ref 0.0–2.0)
Bicarbonate: 26.6 mmol/L (ref 20.0–28.0)
O2 Saturation: 80.7 %
Patient temperature: 37
pCO2, Ven: 41 mmHg — ABNORMAL LOW (ref 44–60)
pH, Ven: 7.42 (ref 7.25–7.43)
pO2, Ven: 44 mmHg (ref 32–45)

## 2021-12-03 ENCOUNTER — Inpatient Hospital Stay: Payer: Medicare HMO | Admitting: Internal Medicine

## 2021-12-03 ENCOUNTER — Telehealth: Payer: Self-pay

## 2021-12-03 DIAGNOSIS — M1712 Unilateral primary osteoarthritis, left knee: Secondary | ICD-10-CM | POA: Diagnosis not present

## 2021-12-03 NOTE — Telephone Encounter (Signed)
Shirlean Mylar states she is calling from [facility] to report a missed visit by patient for this week.  Shirlean Mylar states patient got a cortisone shot in her knee today and the doctor said OT would not be a good idea for 24 hours.  Shirlean Mylar states she is planning to see patient next week.

## 2021-12-06 ENCOUNTER — Inpatient Hospital Stay: Payer: Medicare HMO | Admitting: Internal Medicine

## 2021-12-07 ENCOUNTER — Telehealth: Payer: Self-pay | Admitting: Internal Medicine

## 2021-12-07 NOTE — Telephone Encounter (Signed)
Spoke with patient's daughter to sched AWV she req CB 10/26/ or 10/27

## 2021-12-09 ENCOUNTER — Emergency Department: Payer: Medicare HMO

## 2021-12-09 ENCOUNTER — Other Ambulatory Visit: Payer: Self-pay

## 2021-12-09 ENCOUNTER — Inpatient Hospital Stay
Admission: EM | Admit: 2021-12-09 | Discharge: 2021-12-11 | DRG: 291 | Disposition: A | Discharge status: Home Health | Payer: Medicare HMO | Attending: Internal Medicine | Admitting: Internal Medicine

## 2021-12-09 DIAGNOSIS — R531 Weakness: Secondary | ICD-10-CM

## 2021-12-09 DIAGNOSIS — Z9049 Acquired absence of other specified parts of digestive tract: Secondary | ICD-10-CM

## 2021-12-09 DIAGNOSIS — Z85828 Personal history of other malignant neoplasm of skin: Secondary | ICD-10-CM | POA: Diagnosis not present

## 2021-12-09 DIAGNOSIS — I509 Heart failure, unspecified: Secondary | ICD-10-CM

## 2021-12-09 DIAGNOSIS — J90 Pleural effusion, not elsewhere classified: Secondary | ICD-10-CM | POA: Diagnosis not present

## 2021-12-09 DIAGNOSIS — E7800 Pure hypercholesterolemia, unspecified: Secondary | ICD-10-CM | POA: Diagnosis present

## 2021-12-09 DIAGNOSIS — Z9221 Personal history of antineoplastic chemotherapy: Secondary | ICD-10-CM | POA: Diagnosis not present

## 2021-12-09 DIAGNOSIS — D509 Iron deficiency anemia, unspecified: Secondary | ICD-10-CM | POA: Diagnosis not present

## 2021-12-09 DIAGNOSIS — I739 Peripheral vascular disease, unspecified: Secondary | ICD-10-CM | POA: Diagnosis not present

## 2021-12-09 DIAGNOSIS — I4891 Unspecified atrial fibrillation: Secondary | ICD-10-CM | POA: Diagnosis present

## 2021-12-09 DIAGNOSIS — Z85038 Personal history of other malignant neoplasm of large intestine: Secondary | ICD-10-CM | POA: Diagnosis not present

## 2021-12-09 DIAGNOSIS — R0602 Shortness of breath: Secondary | ICD-10-CM | POA: Diagnosis not present

## 2021-12-09 DIAGNOSIS — R0689 Other abnormalities of breathing: Secondary | ICD-10-CM | POA: Diagnosis not present

## 2021-12-09 DIAGNOSIS — G629 Polyneuropathy, unspecified: Secondary | ICD-10-CM | POA: Diagnosis present

## 2021-12-09 DIAGNOSIS — I110 Hypertensive heart disease with heart failure: Secondary | ICD-10-CM | POA: Diagnosis not present

## 2021-12-09 DIAGNOSIS — Z83438 Family history of other disorder of lipoprotein metabolism and other lipidemia: Secondary | ICD-10-CM | POA: Diagnosis not present

## 2021-12-09 DIAGNOSIS — T502X5A Adverse effect of carbonic-anhydrase inhibitors, benzothiadiazides and other diuretics, initial encounter: Secondary | ICD-10-CM | POA: Diagnosis present

## 2021-12-09 DIAGNOSIS — I5033 Acute on chronic diastolic (congestive) heart failure: Secondary | ICD-10-CM | POA: Diagnosis not present

## 2021-12-09 DIAGNOSIS — Z7901 Long term (current) use of anticoagulants: Secondary | ICD-10-CM

## 2021-12-09 DIAGNOSIS — Z66 Do not resuscitate: Secondary | ICD-10-CM | POA: Diagnosis present

## 2021-12-09 DIAGNOSIS — Z79899 Other long term (current) drug therapy: Secondary | ICD-10-CM

## 2021-12-09 DIAGNOSIS — N179 Acute kidney failure, unspecified: Secondary | ICD-10-CM | POA: Diagnosis not present

## 2021-12-09 DIAGNOSIS — Z885 Allergy status to narcotic agent status: Secondary | ICD-10-CM

## 2021-12-09 DIAGNOSIS — I10 Essential (primary) hypertension: Secondary | ICD-10-CM | POA: Diagnosis not present

## 2021-12-09 DIAGNOSIS — Z8249 Family history of ischemic heart disease and other diseases of the circulatory system: Secondary | ICD-10-CM | POA: Diagnosis not present

## 2021-12-09 DIAGNOSIS — R062 Wheezing: Secondary | ICD-10-CM | POA: Diagnosis not present

## 2021-12-09 DIAGNOSIS — R0902 Hypoxemia: Secondary | ICD-10-CM | POA: Diagnosis not present

## 2021-12-09 DIAGNOSIS — K219 Gastro-esophageal reflux disease without esophagitis: Secondary | ICD-10-CM | POA: Diagnosis present

## 2021-12-09 HISTORY — DX: Heart failure, unspecified: I50.9

## 2021-12-09 LAB — CBC
HCT: 29.1 % — ABNORMAL LOW (ref 36.0–46.0)
Hemoglobin: 9.1 g/dL — ABNORMAL LOW (ref 12.0–15.0)
MCH: 31.4 pg (ref 26.0–34.0)
MCHC: 31.3 g/dL (ref 30.0–36.0)
MCV: 100.3 fL — ABNORMAL HIGH (ref 80.0–100.0)
Platelets: 228 10*3/uL (ref 150–400)
RBC: 2.9 MIL/uL — ABNORMAL LOW (ref 3.87–5.11)
RDW: 14.9 % (ref 11.5–15.5)
WBC: 8.6 10*3/uL (ref 4.0–10.5)
nRBC: 0 % (ref 0.0–0.2)

## 2021-12-09 LAB — COMPREHENSIVE METABOLIC PANEL
ALT: 10 U/L (ref 0–44)
AST: 14 U/L — ABNORMAL LOW (ref 15–41)
Albumin: 3.1 g/dL — ABNORMAL LOW (ref 3.5–5.0)
Alkaline Phosphatase: 46 U/L (ref 38–126)
Anion gap: 10 (ref 5–15)
BUN: 34 mg/dL — ABNORMAL HIGH (ref 8–23)
CO2: 23 mmol/L (ref 22–32)
Calcium: 8.8 mg/dL — ABNORMAL LOW (ref 8.9–10.3)
Chloride: 106 mmol/L (ref 98–111)
Creatinine, Ser: 1.22 mg/dL — ABNORMAL HIGH (ref 0.44–1.00)
GFR, Estimated: 42 mL/min — ABNORMAL LOW (ref >60)
Glucose, Bld: 122 mg/dL — ABNORMAL HIGH (ref 70–99)
Potassium: 3.7 mmol/L (ref 3.5–5.1)
Sodium: 139 mmol/L (ref 135–145)
Total Bilirubin: 1.2 mg/dL (ref 0.3–1.2)
Total Protein: 6.3 g/dL — ABNORMAL LOW (ref 6.5–8.1)

## 2021-12-09 LAB — LACTIC ACID, PLASMA
Lactic Acid, Venous: 1.5 mmol/L (ref 0.5–1.9)
Lactic Acid, Venous: 2 mmol/L (ref 0.5–1.9)
Lactic Acid, Venous: 1.9 mmol/L (ref 0.5–1.9)

## 2021-12-09 LAB — PROCALCITONIN: Procalcitonin: <0.1 ng/mL

## 2021-12-09 LAB — BRAIN NATRIURETIC PEPTIDE: B Natriuretic Peptide: 401.6 pg/mL — ABNORMAL HIGH (ref 0.0–100.0)

## 2021-12-09 LAB — TROPONIN I (HIGH SENSITIVITY): Troponin I (High Sensitivity): 13 ng/L (ref <18)

## 2021-12-09 MED ORDER — ONDANSETRON HCL 4 MG/2ML IJ SOLN: 4.0000 mg | Freq: Four times a day (QID) | INTRAMUSCULAR | Status: DC | PRN

## 2021-12-09 MED ORDER — FUROSEMIDE 10 MG/ML IJ SOLN
40.0000 mg | Freq: Two times a day (BID) | INTRAMUSCULAR | Status: AC
  Administered 2021-12-09 – 2021-12-11 (×4): 40 mg via INTRAVENOUS
  Filled 2021-12-09 (×4): qty 4

## 2021-12-09 MED ORDER — ONDANSETRON 4 MG PO TBDP: 4.0000 mg | ORAL_TABLET | Freq: Two times a day (BID) | ORAL | Status: DC | PRN

## 2021-12-09 MED ORDER — AMLODIPINE BESYLATE 5 MG PO TABS
5.0000 mg | ORAL_TABLET | Freq: Every day | ORAL | Status: DC
  Administered 2021-12-09 – 2021-12-11 (×3): 5 mg via ORAL
  Filled 2021-12-09 (×3): qty 1

## 2021-12-09 MED ORDER — OXYCODONE HCL 5 MG PO TABS
5.0000 mg | ORAL_TABLET | Freq: Three times a day (TID) | ORAL | Status: DC
  Administered 2021-12-09 – 2021-12-11 (×4): 5 mg via ORAL
  Filled 2021-12-09 (×6): qty 1

## 2021-12-09 MED ORDER — GABAPENTIN 100 MG PO CAPS
200.0000 mg | ORAL_CAPSULE | Freq: Every day | ORAL | Status: DC
  Administered 2021-12-09 – 2021-12-10 (×2): 200 mg via ORAL
  Filled 2021-12-09 (×2): qty 2

## 2021-12-09 MED ORDER — PANTOPRAZOLE SODIUM 40 MG PO TBEC
40.0000 mg | DELAYED_RELEASE_TABLET | Freq: Two times a day (BID) | ORAL | Status: DC
  Administered 2021-12-09 – 2021-12-11 (×4): 40 mg via ORAL
  Filled 2021-12-09 (×4): qty 1

## 2021-12-09 MED ORDER — SODIUM CHLORIDE 0.9% FLUSH
3.0000 mL | Freq: Two times a day (BID) | INTRAVENOUS | Status: DC
  Administered 2021-12-09 – 2021-12-11 (×4): 3 mL via INTRAVENOUS

## 2021-12-09 MED ORDER — FUROSEMIDE 10 MG/ML IJ SOLN
40.0000 mg | Freq: Once | INTRAMUSCULAR | Status: AC
  Administered 2021-12-09: 40 mg via INTRAVENOUS
  Filled 2021-12-09: qty 4

## 2021-12-09 MED ORDER — SODIUM CHLORIDE 0.9 % IV SOLN: 250.0000 mL | INTRAVENOUS | Status: DC | PRN

## 2021-12-09 MED ORDER — POTASSIUM CHLORIDE CRYS ER 10 MEQ PO TBCR
10.0000 meq | EXTENDED_RELEASE_TABLET | Freq: Two times a day (BID) | ORAL | Status: DC
  Administered 2021-12-09 – 2021-12-11 (×4): 10 meq via ORAL
  Filled 2021-12-09 (×4): qty 1

## 2021-12-09 MED ORDER — DOCUSATE SODIUM 100 MG PO CAPS
100.0000 mg | ORAL_CAPSULE | Freq: Two times a day (BID) | ORAL | Status: DC
  Administered 2021-12-09 – 2021-12-11 (×4): 100 mg via ORAL
  Filled 2021-12-09 (×4): qty 1

## 2021-12-09 MED ORDER — LORATADINE 10 MG PO TABS
10.0000 mg | ORAL_TABLET | Freq: Every day | ORAL | Status: DC
  Administered 2021-12-10 – 2021-12-11 (×2): 10 mg via ORAL
  Filled 2021-12-09 (×2): qty 1

## 2021-12-09 MED ORDER — SODIUM CHLORIDE 0.9% FLUSH: 3.0000 mL | INTRAVENOUS | Status: DC | PRN

## 2021-12-09 MED ORDER — ACETAMINOPHEN 325 MG PO TABS: 650.0000 mg | ORAL_TABLET | ORAL | Status: DC | PRN

## 2021-12-09 MED ORDER — IRBESARTAN 150 MG PO TABS
300.0000 mg | ORAL_TABLET | Freq: Every day | ORAL | Status: DC
  Administered 2021-12-10 – 2021-12-11 (×2): 300 mg via ORAL
  Filled 2021-12-09 (×2): qty 2

## 2021-12-09 MED ORDER — DICLOFENAC SODIUM 1 % EX GEL: 2.0000 g | Freq: Three times a day (TID) | CUTANEOUS | Status: DC | PRN

## 2021-12-09 MED ORDER — ALBUTEROL SULFATE (2.5 MG/3ML) 0.083% IN NEBU: 3.0000 mL | INHALATION_SOLUTION | Freq: Four times a day (QID) | RESPIRATORY_TRACT | Status: DC | PRN

## 2021-12-09 MED ORDER — METOPROLOL SUCCINATE ER 25 MG PO TB24
25.0000 mg | ORAL_TABLET | Freq: Two times a day (BID) | ORAL | Status: DC
  Administered 2021-12-09 – 2021-12-11 (×4): 25 mg via ORAL
  Filled 2021-12-09 (×5): qty 1

## 2021-12-09 MED ORDER — SIMVASTATIN 20 MG PO TABS
10.0000 mg | ORAL_TABLET | Freq: Every day | ORAL | Status: DC
  Administered 2021-12-09 – 2021-12-10 (×2): 10 mg via ORAL
  Filled 2021-12-09 (×2): qty 1

## 2021-12-09 MED ORDER — ENOXAPARIN SODIUM 40 MG/0.4ML IJ SOSY
40.0000 mg | PREFILLED_SYRINGE | INTRAMUSCULAR | Status: DC
  Administered 2021-12-09 – 2021-12-10 (×2): 40 mg via SUBCUTANEOUS
  Filled 2021-12-09 (×2): qty 0.4

## 2021-12-09 MED ORDER — METOCLOPRAMIDE HCL 10 MG PO TABS
5.0000 mg | ORAL_TABLET | Freq: Two times a day (BID) | ORAL | Status: DC
  Administered 2021-12-09 – 2021-12-11 (×4): 5 mg via ORAL
  Filled 2021-12-09 (×4): qty 1

## 2021-12-09 NOTE — Subjective & Objective (Addendum)
[name] is a 88 y.o. female with past medical history of CHF, PAD, hypertension, atrial fibrillation who presents with complaints of shortness of breath.  EMS was called to her house because she slid to the floor as her husband was helping her to the bathroom and had a hard time getting up.  EMS found her to be quite short of breath.  Room air saturations of 84%.  Started on nasal cannula oxygen and transmitted to the emergency department.

## 2021-12-09 NOTE — H&P (Signed)
History and Physical    SEQUOIA WITZ GEZ:[id number] DOB: 01/20/1929 DOA: 12/09/2021  DOS: the patient was seen and examined on 12/09/2021  PCP: [name], MD   Patient coming from: Home  I have personally briefly reviewed patient's old medical records in [place] is a 88 y.o. female with past medical history of CHF, PAD, hypertension, atrial fibrillation who presents with complaints of shortness of breath.  EMS was called to her house because she slid to the floor as her husband was helping her to the bathroom and had a hard time getting up.  EMS found her to be quite short of breath.  Room air saturations of 84%.  Started on nasal cannula oxygen and transmitted to the emergency department.    ED Course: afebrile, 144/55  83  18. EDP exam positive for rales and LE edema. Lab: BNP 401.6  glucose 122  BUN/Cr 34/1.22  Albumin 3.1, T. Protein 6.3, Hgb 9.1(11.5 on 11/18/21) with no report of bleeding except for bruising both arms. CXR - asymetric edema vs infiltrate - improved from last study. Patient give Lasix 40 mg IV in ED> TRH called to admit for continued mgt of acute on chronic HFpEF.   Review of Systems:  Review of Systems  Constitutional:  Negative for chills, fever and weight loss.  HENT:  Positive for hearing loss.   Eyes: Negative.   Respiratory:  Positive for shortness of breath and wheezing.   Cardiovascular:  Positive for leg swelling. Negative for chest pain and palpitations.  Gastrointestinal: Negative.   Genitourinary: Negative.   Musculoskeletal: Negative.   Skin:        Subcutaneous hemorrhage right forearm. Fluid blisters distal LE.   Neurological:  Positive for weakness.  Endo/Heme/Allergies: Negative.   Psychiatric/Behavioral: Negative.      Past Medical History:  Diagnosis Date   Anemia    Barrett's esophagus    BCC (basal cell carcinoma of skin) 03/07/2013   vertex scalp - NODULAR NODULAR PATTERN PATTERN   Bowel obstruction  (HCC)    s/p adhesion resection   CHF (congestive heart failure) (HCC)    Chronic back pain    Colon cancer ([place]) 1988 and 1989   adenocarcinoma, s/p resection x  2 and chemo   Diverticulitis    GERD (gastroesophageal reflux disease)    H/O open leg wound    Hiatal hernia    Hypercholesteremia    Hypertension    Lumbar scoliosis    OA (osteoarthritis)    Peripheral neuropathy    Recurrent sinus infections    Renal cyst    S/P chemotherapy, time since greater than 12 weeks    colon cancer   Vertigo     Past Surgical History:  Procedure Laterality Date   ABDOMINAL HYSTERECTOMY  1982   adhesions resected     bowel obstruction   APPENDECTOMY     BREAST BIOPSY Left    negative 06/14/1985   CHOLECYSTECTOMY  1994   COLON RESECTION     x2.  s/p colon cancer   COLON SURGERY  1958 and [place]   EXCISIONAL HEMORRHOIDECTOMY     with tubal ligation   EXPLORATORY LAPAROTOMY  1991   Secondary to SBO   Soc Hx - married 80 years.  She has 4 daughters, 7 grands, 9 great-grands. Lives with spouse - daughter nearby and someone checks on them daily, helps with meals and chores.   reports that  she has never smoked. She has never used smokeless tobacco. She reports that she does not drink alcohol and does not use drugs.  Allergies  Allergen Reactions   Fentanyl Other (See Comments)    confusion    Family History  Problem Relation Age of Onset   Breast cancer Mother    Asthma Mother    Stroke Father    Hypertension Father    Diabetes Father    Ovarian cancer Sister        x2   Prostate cancer Brother    Hypertension Brother        x3   Heart disease Brother    Hypercholesterolemia Brother        x3   Diabetes Brother    Spina bifida Grandchild    Hematuria Neg Hx    Kidney cancer Neg Hx    Kidney disease Neg Hx    Sickle cell trait Neg Hx    Tuberculosis Neg Hx     Prior to Admission medications   Medication Sig Start Date End Date Taking? Authorizing  Provider  albuterol (VENTOLIN HFA) 108 (90 Base) MCG/ACT inhaler TAKE 2 PUFFS BY MOUTH EVERY 6 HOURS AS NEEDED FOR WHEEZE OR SHORTNESS OF BREATH 11/12/21  Yes [name], MD  amLODipine (NORVASC) 5 MG tablet TAKE 1 TABLET EVERY DAY 11/29/21  Yes [name], MD  calcium carbonate (OSCAL) 1500 (600 Ca) MG TABS tablet Take 600 mg of elemental calcium by mouth 2 (two) times daily with a meal.   Yes [provider]  Cholecalciferol (VITAMIN D3) 50 MCG (2000 UT) capsule Take 2,000 Units by mouth daily.   Yes [provider]  diclofenac Sodium (VOLTAREN) 1 % GEL Apply 2 g topically 3 (three) times daily. 11/20/21  Yes Fritzi Mandes, MD  docusate sodium (COLACE) 100 MG capsule Take 100 mg by mouth 2 (two) times daily.   Yes [provider]  fexofenadine (ALLEGRA) 60 MG tablet TAKE 1 TABLET EVERY DAY 08/29/21  Yes Dutch Quint B, FNP  furosemide (LASIX) 20 MG tablet Take 1 tablet (20 mg total) by mouth daily. 11/24/21  Yes Fritzi Mandes, MD  gabapentin (NEURONTIN) 100 MG capsule TAKE 2 CAPSULES AT BEDTIME 05/18/20  Yes [name], MD  irbesartan (AVAPRO) 300 MG tablet TAKE 1 TABLET EVERY DAY 11/22/21  Yes [name], MD  metoCLOPramide (REGLAN) 5 MG tablet Take 5 mg by mouth 2 (two) times daily. 11/29/21  Yes [provider]  metoprolol succinate (TOPROL-XL) 25 MG 24 hr tablet TAKE 1 TABLET TWICE DAILY 04/27/21  Yes [name], MD  oxyCODONE (OXY IR/ROXICODONE) 5 MG immediate release tablet Take 5 mg by mouth 3 (three) times daily.    Yes [provider]  pantoprazole (PROTONIX) 40 MG tablet Take 40 mg by mouth 2 (two) times daily. 11/29/21  Yes [provider]  simvastatin (ZOCOR) 10 MG tablet TAKE 1 TABLET (10 MG TOTAL) AT BEDTIME. 02/23/21  Yes [name], MD  traMADol (ULTRAM) 50 MG tablet Take 1 tablet by mouth at bedtime as needed. 07/07/20  Yes [provider]  apixaban (ELIQUIS) 5 MG TABS tablet Take 1 tablet (5 mg  total) by mouth 2 (two) times daily. Patient not taking: Reported on 12/09/2021 11/20/21   Fritzi Mandes, MD  furosemide (LASIX) 40 MG tablet Take 40 mg qd for 3 days and then prn for increased leg swelling or weight gain Patient not taking: Reported on 12/09/2021 11/21/21   Fritzi Mandes, MD  meclizine (ANTIVERT) 12.5 MG tablet Take 1 tablet (12.5 mg total) by mouth 2 (two) times daily as needed for dizziness. Patient not taking: Reported on 12/09/2021 10/29/19   [name], MD  mometasone (ELOCON) 0.1 % cream Apply 1 application topically See admin instructions. Apply at bedtime to affected areas under breast and lower abdominal folder on tuesdays, thursdays, and Saturdays weekly. Patient not taking: Reported on 12/09/2021 03/04/21   Ralene Bathe, MD  ondansetron (ZOFRAN ODT) 4 MG disintegrating tablet Take 1 tablet (4 mg total) by mouth 2 (two) times daily as needed for nausea or vomiting. 10/29/19   [name], MD  [employer] [facility] ([place] [place]  06/26/19   [provider]    Physical Exam: Vitals:   12/09/21 0851 12/09/21 0854 12/09/21 1230  BP:  (!) 166/67 (!) 144/55  Pulse:  (!) 104 83  Resp:  17 18  Temp:  98 F (36.7 C)   TempSrc:  Oral   SpO2:  93% 95%  Weight: 77.7 kg      Physical Exam Vitals and nursing note reviewed.  Constitutional:      General: She is not in acute distress.    Appearance: She is obese. She is not toxic-appearing.  HENT:     Head: Normocephalic and atraumatic.     Mouth/Throat:     Mouth: Mucous membranes are moist.     Pharynx: No pharyngeal swelling.  Eyes:     Extraocular Movements: Extraocular movements intact.     Pupils: Pupils are equal, round, and reactive to light.  Neck:     Vascular: No hepatojugular reflux.  Cardiovascular:     Rate and Rhythm: Normal rate. Rhythm irregular.     Pulses: Normal pulses.  Pulmonary:     Effort: Pulmonary effort is normal.     Breath sounds: Examination of the  right-upper field reveals rales. Examination of the left-upper field reveals rales. Examination of the right-middle field reveals rales. Examination of the left-middle field reveals rales. Examination of the right-lower field reveals rales. Examination of the left-lower field reveals rales. Rales present.  Chest:     Chest wall: No mass or deformity.  Abdominal:     General: Bowel sounds are normal.     Palpations: Abdomen is soft.  Musculoskeletal:     Cervical back: Normal range of motion and neck supple.     Right lower leg: Edema present.     Left lower leg: Edema present.     Comments: 2+ pitting edema bilateral LE to above the ankle  Skin:    Comments: Large ecchymosis right forearm. Fluid blisters bilateral distal LE.  Neurological:     Mental Status: She is alert and oriented to person, place, and time.     Comments: ? HOH, ? Slow cognition. Speech clear. Answers questions. Follows 2-step commands  Psychiatric:        Mood and Affect: Mood normal.      Labs on Admission: I have personally reviewed following labs and imaging studies  CBC: Recent Labs  Lab 12/09/21 1013  WBC 8.6  HGB 9.1*  HCT 29.1*  MCV 100.3*  PLT 240   Basic Metabolic Panel: Recent Labs  Lab 12/09/21 1013  NA 139  K 3.7  CL 106  CO2 23  GLUCOSE 122*  BUN 34*  CREATININE 1.22*  CALCIUM 8.8*   GFR: Estimated Creatinine Clearance: 30.2 mL/min (A) (by C-G formula based on SCr of 1.22 mg/dL (H)). Liver Function Tests:  Recent Labs  Lab 12/09/21 1013  AST 14*  ALT 10  ALKPHOS 46  BILITOT 1.2  PROT 6.3*  ALBUMIN 3.1*   No results for input(s): "LIPASE", "AMYLASE" in the last 168 hours. No results for input(s): "AMMONIA" in the last 168 hours. Coagulation Profile: No results for input(s): "INR", "PROTIME" in the last 168 hours. Cardiac Enzymes: No results for input(s): "CKTOTAL", "CKMB", "CKMBINDEX", "TROPONINI" in the last 168 hours. BNP (last 3 results) No results for input(s):  "PROBNP" in the last 8760 hours. HbA1C: No results for input(s): "HGBA1C" in the last 72 hours. CBG: No results for input(s): "GLUCAP" in the last 168 hours. Lipid Profile: No results for input(s): "CHOL", "HDL", "LDLCALC", "TRIG", "CHOLHDL", "LDLDIRECT" in the last 72 hours. Thyroid Function Tests: No results for input(s): "TSH", "T4TOTAL", "FREET4", "T3FREE", "THYROIDAB" in the last 72 hours. Anemia Panel: No results for input(s): "VITAMINB12", "FOLATE", "FERRITIN", "TIBC", "IRON", "RETICCTPCT" in the last 72 hours. Urine analysis:    Component Value Date/Time   COLORURINE STRAW (A) 11/16/2021 2035   APPEARANCEUR CLEAR (A) 11/16/2021 2035   LABSPEC 1.008 11/16/2021 2035   PHURINE 5.0 11/16/2021 2035   GLUCOSEU NEGATIVE 11/16/2021 2035   GLUCOSEU NEGATIVE 04/04/2018 1255   HGBUR NEGATIVE 11/16/2021 2035   BILIRUBINUR NEGATIVE 11/16/2021 2035   KETONESUR NEGATIVE 11/16/2021 2035   PROTEINUR NEGATIVE 11/16/2021 2035   UROBILINOGEN 0.2 04/04/2018 1255   NITRITE NEGATIVE 11/16/2021 2035   LEUKOCYTESUR MODERATE (A) 11/16/2021 2035    Radiological Exams on Admission: I have personally reviewed images DG Chest Port 1 View  Result Date: 12/09/2021 CLINICAL DATA:  sob, chf EXAM: PORTABLE CHEST 1 VIEW COMPARISON:  Chest x-ray November 16, 2021. FINDINGS: Mildly improved asymmetric patchy left interstitial and airspace opacities. Small left pleural effusions. No visible pneumothorax. Mildly enlarged cardiac silhouette. IMPRESSION: 1. Mildly improved asymmetric patchy left interstitial and airspace opacities, which could represent asymmetric edema or pneumonia. 2. Small left pleural effusion. Electronically Signed   By: Margaretha Sheffield M.D.   On: 12/09/2021 09:37    EKG: I have personally reviewed EKG: a. Fib at 97, mild LVH, no acute change/no STEMI  Assessment/Plan Principal Problem:   Acute on chronic diastolic heart failure (HCC) Active Problems:   AKI (acute kidney injury) ([place])    Anemia, iron deficiency    Assessment and Plan: * Acute on chronic diastolic heart failure ([place]) Patient with known HFpEF, last ECHO 11/17/21 EF 50-55% mild LVH, diastolic fx indeterminent. Admitted 10/3-10/7/23 for HFpEF. At discharge mild renal insufficiency. She was to take furosemide 40 mg daily x 3 days and then 20 mg daily. She continued on ARB, BB. Over the past 24-48 hrs increased SOB and today low O2 sat c/w exacerbation acute on chronic HFpEF.  Plan Tele observation  Lasix 40 mg q12 x 4 doses then resume lasix 40 mg daily  Continue other home cardiac meds.   AKI (acute kidney injury) ([place]) Patient with mild AKI with rise of Cr to 1.22 from 1.14, most likely related to diuretics.  Plan Low dose lasix for HFpEF  F/u Bmet  May need gentle hydration if Cr continues to rise.  Anemia, iron deficiency Anemia at at baseline but Hgb has gone from 11.5 on 11/18/21 to 9.1. Only blood loss is ecchymosis right arm.  Plan Anemia panel  F/u H/H in AM       DVT prophylaxis: Lovenox Code Status: DNR/DNI(Do NOT Intubate) Family Communication: husbnad and daughter present during exam. No questions  Disposition Plan:  home when stable 24-48 hrs  Consults called: none  Admission status: Observation, Telemetry bed   Adella Hare, MD [facility] 12/09/2021, 4:06 PM

## 2021-12-09 NOTE — Assessment & Plan Note (Addendum)
Patient with mild AKI with rise of Cr to 1.22 from 1.14, most likely related to diuretics.  Lab Results  Component Value Date   CREATININE 1.18 (H) 12/10/2021   CREATININE 1.22 (H) 12/09/2021   CREATININE 1.14 (H) 11/19/2021  Continue low dose lasix for HFpEF

## 2021-12-09 NOTE — ED Triage Notes (Signed)
Pt discharged approx 1 week ago for CHF exacerbation. EMS called this morning for a lift assist after she "slid to the floor" denies hitting head. Pt found by EMS to have expiratory wheezes and RA sat of 84%. EMS gave 2 duonebs in route with mild improvement of wheezing

## 2021-12-09 NOTE — ED Provider Notes (Signed)
[facility] Provider Note    Event Date/Time   First MD Initiated Contact with Patient 12/09/21 [phone]     (approximate)   History   Shortness of Breath   HPI  [name] is a 88 y.o. female with past medical history of CHF, PAD, hypertension, atrial fibrillation who presents with complaints of shortness of breath.  EMS was called to her house because she slid to the floor out of her bed and had a hard time getting up.  EMS found her to be quite short of breath.  Room air saturations of 84%.  Started on nasal cannula oxygen and transmitted to the emergency department.  Patient states it feels similar to how she felt when she was admitted several weeks ago, per review of records she was discharged on October 7 after treatment for acute on chronic CHF and new onset atrial fibrillation     Physical Exam   Triage Vital Signs: ED Triage Vitals  Enc Vitals Group     BP 12/09/21 0854 (!) 166/67     Pulse Rate 12/09/21 0854 (!) 104     Resp 12/09/21 0854 17     Temp 12/09/21 0854 98 F (36.7 C)     Temp Source 12/09/21 0854 Oral     SpO2 12/09/21 0854 93 %     Weight 12/09/21 0851 77.7 kg (171 lb 4.8 oz)     Height --      Head Circumference --      Peak Flow --      Pain Score 12/09/21 0851 0     Pain Loc --      Pain Edu? --      Excl. in [place]? --     Most recent vital signs: Vitals:   12/09/21 0854  BP: (!) 166/67  Pulse: (!) 104  Resp: 17  Temp: 98 F (36.7 C)  SpO2: 93%     General: Awake, no distress.  CV:  Good peripheral perfusion.  Irregular heart rate Resp:  Mild tachypnea bibasilar Rales Abd:  No distention.  Other:  2+ lower extremity edema bilaterally   ED Results / Procedures / Treatments   Labs (all labs ordered are listed, but only abnormal results are displayed) Labs Reviewed  CBC - Abnormal; Notable for the following components:      Result Value   RBC 2.90 (*)    Hemoglobin 9.1 (*)    HCT 29.1 (*)    MCV 100.3  (*)    All other components within normal limits  COMPREHENSIVE METABOLIC PANEL - Abnormal; Notable for the following components:   Glucose, Bld 122 (*)    BUN 34 (*)    Creatinine, Ser 1.22 (*)    Calcium 8.8 (*)    Total Protein 6.3 (*)    Albumin 3.1 (*)    AST 14 (*)    GFR, Estimated 42 (*)    All other components within normal limits  BRAIN NATRIURETIC PEPTIDE - Abnormal; Notable for the following components:   B Natriuretic Peptide 401.6 (*)    All other components within normal limits  CULTURE, BLOOD (ROUTINE X 2)  CULTURE, BLOOD (ROUTINE X 2)  LACTIC ACID, PLASMA  LACTIC ACID, PLASMA  TROPONIN I (HIGH SENSITIVITY)     EKG  ED ECG REPORT I, Lavonia Drafts, the attending physician, personally viewed and interpreted this ECG.  Date: 12/09/2021  Rhythm: Atrial fibrillation QRS Axis: normal Intervals: Abnormal ST/T Wave abnormalities:  Nonspecific changes Narrative Interpretation: Occasional PVC    RADIOLOGY Chest x-ray viewed interpreted by me, consistent with CHF exacerbation, doubt pneumonia    PROCEDURES:  Critical Care performed: yes  CRITICAL CARE Performed by: Lavonia Drafts   Total critical care time: 30 minutes  Critical care time was exclusive of separately billable procedures and treating other patients.  Critical care was necessary to treat or prevent imminent or life-threatening deterioration.  Critical care was time spent personally by me on the following activities: development of treatment plan with patient and/or surrogate as well as nursing, discussions with consultants, evaluation of patient's response to treatment, examination of patient, obtaining history from patient or surrogate, ordering and performing treatments and interventions, ordering and review of laboratory studies, ordering and review of radiographic studies, pulse oximetry and re-evaluation of patient's condition.   Procedures   MEDICATIONS ORDERED IN ED: Medications   furosemide (LASIX) injection 40 mg (40 mg Intravenous Given 12/09/21 1131)     IMPRESSION / MDM / ASSESSMENT AND PLAN / ED COURSE  I reviewed the triage vital signs and the nursing notes. Patient's presentation is most consistent with acute presentation with potential threat to life or bodily function.  Patient brought in for hypoxia, room air saturation of 84%.  Improved on nasal cannula oxygen.  Differential includes CHF/pulmonary edema, less likely pneumonia, less likely bronchospasm  Exam is most consistent with CHF, pending x-ray, labs.  Echo was performed on October 4 during last admission, demonstrated EF of 50 to 55%.  Delay in obtaining lab work, results are now starting to come in, BNP is 401 which is down from earlier this month however clinically the patient is still requiring nasal cannula oxygen, I have consulted the hospitalist service for admission for further diuresis, IV Lasix given in the emergency department.      FINAL CLINICAL IMPRESSION(S) / ED DIAGNOSES   Final diagnoses:  Acute on chronic congestive heart failure, unspecified heart failure type ([place])     Rx / DC Orders   ED Discharge Orders     None        Note:  This document was prepared using Dragon voice recognition software and may include unintentional dictation errors.   Lavonia Drafts, MD 12/09/21 1143

## 2021-12-09 NOTE — ED Notes (Signed)
Informed RN bed assigned 

## 2021-12-09 NOTE — Assessment & Plan Note (Addendum)
Patient with known HFpEF, last ECHO 11/17/21 EF 50-55% mild LVH, diastolic fx indeterminent. Admitted 10/3-10/7/23 for HFpEF. At discharge mild renal insufficiency. She was to take furosemide 40 mg daily x 3 days and then 20 mg daily. She continued on ARB, BB. Over the past 24-48 hrs increased SOB and admitted with low O2 sat c/w exacerbation acute on chronic HFpEF. -Continue lasix 40 mg q12 x 4 doses then resume lasix 40 mg daily - Continue other home cardiac meds.

## 2021-12-09 NOTE — Assessment & Plan Note (Addendum)
Anemia at at baseline but Hgb has gone from 11.5 on 11/18/21 to 9.1. Only blood loss is ecchymosis right arm. Monitor H&H.  No obvious bleed

## 2021-12-10 DIAGNOSIS — I4891 Unspecified atrial fibrillation: Secondary | ICD-10-CM | POA: Diagnosis present

## 2021-12-10 DIAGNOSIS — Z885 Allergy status to narcotic agent status: Secondary | ICD-10-CM | POA: Diagnosis not present

## 2021-12-10 DIAGNOSIS — Z83438 Family history of other disorder of lipoprotein metabolism and other lipidemia: Secondary | ICD-10-CM | POA: Diagnosis not present

## 2021-12-10 DIAGNOSIS — T502X5A Adverse effect of carbonic-anhydrase inhibitors, benzothiadiazides and other diuretics, initial encounter: Secondary | ICD-10-CM | POA: Diagnosis present

## 2021-12-10 DIAGNOSIS — Z85038 Personal history of other malignant neoplasm of large intestine: Secondary | ICD-10-CM | POA: Diagnosis not present

## 2021-12-10 DIAGNOSIS — I739 Peripheral vascular disease, unspecified: Secondary | ICD-10-CM | POA: Diagnosis present

## 2021-12-10 DIAGNOSIS — Z79899 Other long term (current) drug therapy: Secondary | ICD-10-CM | POA: Diagnosis not present

## 2021-12-10 DIAGNOSIS — R531 Weakness: Secondary | ICD-10-CM

## 2021-12-10 DIAGNOSIS — K219 Gastro-esophageal reflux disease without esophagitis: Secondary | ICD-10-CM | POA: Diagnosis present

## 2021-12-10 DIAGNOSIS — D509 Iron deficiency anemia, unspecified: Secondary | ICD-10-CM

## 2021-12-10 DIAGNOSIS — I110 Hypertensive heart disease with heart failure: Secondary | ICD-10-CM | POA: Diagnosis present

## 2021-12-10 DIAGNOSIS — E7800 Pure hypercholesterolemia, unspecified: Secondary | ICD-10-CM | POA: Diagnosis present

## 2021-12-10 DIAGNOSIS — I509 Heart failure, unspecified: Secondary | ICD-10-CM | POA: Diagnosis not present

## 2021-12-10 DIAGNOSIS — I5033 Acute on chronic diastolic (congestive) heart failure: Secondary | ICD-10-CM | POA: Diagnosis present

## 2021-12-10 DIAGNOSIS — Z9049 Acquired absence of other specified parts of digestive tract: Secondary | ICD-10-CM | POA: Diagnosis not present

## 2021-12-10 DIAGNOSIS — Z8249 Family history of ischemic heart disease and other diseases of the circulatory system: Secondary | ICD-10-CM | POA: Diagnosis not present

## 2021-12-10 DIAGNOSIS — N179 Acute kidney failure, unspecified: Secondary | ICD-10-CM

## 2021-12-10 DIAGNOSIS — Z9221 Personal history of antineoplastic chemotherapy: Secondary | ICD-10-CM | POA: Diagnosis not present

## 2021-12-10 DIAGNOSIS — Z66 Do not resuscitate: Secondary | ICD-10-CM | POA: Diagnosis present

## 2021-12-10 DIAGNOSIS — Z7901 Long term (current) use of anticoagulants: Secondary | ICD-10-CM | POA: Diagnosis not present

## 2021-12-10 DIAGNOSIS — G629 Polyneuropathy, unspecified: Secondary | ICD-10-CM | POA: Diagnosis present

## 2021-12-10 DIAGNOSIS — Z85828 Personal history of other malignant neoplasm of skin: Secondary | ICD-10-CM | POA: Diagnosis not present

## 2021-12-10 LAB — BASIC METABOLIC PANEL WITH GFR
Anion gap: 10 (ref 5–15)
BUN: 30 mg/dL — ABNORMAL HIGH (ref 8–23)
CO2: 23 mmol/L (ref 22–32)
Calcium: 8.7 mg/dL — ABNORMAL LOW (ref 8.9–10.3)
Chloride: 105 mmol/L (ref 98–111)
Creatinine, Ser: 1.18 mg/dL — ABNORMAL HIGH (ref 0.44–1.00)
GFR, Estimated: 44 mL/min — ABNORMAL LOW
Glucose, Bld: 107 mg/dL — ABNORMAL HIGH (ref 70–99)
Potassium: 4 mmol/L (ref 3.5–5.1)
Sodium: 138 mmol/L (ref 135–145)

## 2021-12-10 NOTE — Progress Notes (Signed)
  Progress Note   Patient: [name] MWU:[id number] DOB: 10/03/1928 DOA: 12/09/2021     0 DOS: the patient was seen and examined on 12/10/2021   Brief hospital course: 88 y.o. female with past medical history of CHF, PAD, hypertension, atrial fibrillation admitted for acute on chronic diastolic heart failure  72/53: PT, OT eval   Assessment and Plan: * Acute on chronic diastolic heart failure ([place]) Patient with known HFpEF, last ECHO 11/17/21 EF 50-55% mild LVH, diastolic fx indeterminent. Admitted 10/3-10/7/23 for HFpEF. At discharge mild renal insufficiency. She was to take furosemide 40 mg daily x 3 days and then 20 mg daily. She continued on ARB, BB. Over the past 24-48 hrs increased SOB and admitted with low O2 sat c/w exacerbation acute on chronic HFpEF. -Continue lasix 40 mg q12 x 4 doses then resume lasix 40 mg daily - Continue other home cardiac meds.   AKI (acute kidney injury) ([place]) Patient with mild AKI with rise of Cr to 1.22 from 1.14, most likely related to diuretics.  Lab Results  Component Value Date   CREATININE 1.18 (H) 12/10/2021   CREATININE 1.22 (H) 12/09/2021   CREATININE 1.14 (H) 11/19/2021  Continue low dose lasix for HFpEF     Anemia, iron deficiency Anemia at at baseline but Hgb has gone from 11.5 on 11/18/21 to 9.1. Only blood loss is ecchymosis right arm. Monitor H&H.  No obvious bleed  Weakness Likely multifactorial.  Await PT and OT eval        Subjective: Feels very weak.  Wants to go home  Physical Exam: Vitals:   12/10/21 1221 12/10/21 1355 12/10/21 1419 12/10/21 1556  BP: (!) 118/50   (!) 153/69  Pulse: 91   78  Resp: 16   14  Temp: 97.9 F (36.6 C)   98.3 F (36.8 C)  TempSrc:      SpO2: 91% (!) 88% 96% 100%  Weight:       88 year old female lying in the bed comfortably without any acute distress Lungs clear to auscultation bilaterally Cardiovascular irregularly irregular heart rate Abdomen soft, benign Extremities 2+  pitting edema bilaterally.  Large ecchymosis on the right forearm, fluid blisters in the bilateral lower extremity Neuro alert and awake, slow cognition, nonfocal Data Reviewed:  Creatinine 1.18  Family Communication: Daughter updated over phone  Disposition: Status is: Inpatient Remains inpatient appropriate because: Pending PT, OT eval   Planned Discharge Destination: Home with Home Health    DVT prophylaxis-Lovenox Time spent: 35 minutes  Author: Max Sane, MD 12/10/2021 4:17 PM  For on call review [email or web address].

## 2021-12-10 NOTE — Assessment & Plan Note (Signed)
Likely multifactorial.  Await PT and OT eval

## 2021-12-10 NOTE — TOC Initial Note (Signed)
Transition of Care [facility]) - Initial/Assessment Note    Patient Details  Name: [name] MRN: [id number] Date of Birth: Dec 27, 1928  Transition of Care [facility]) CM/SW Contact:    Alberteen Sam, LCSW Phone Number: 12/10/2021, 9:57 AM  Clinical Narrative:                  CSW notes that patient is from home with [facility] home health, last recent admission  [employer] had delivered 3-1 to pt's home address as requested.   Daughter [name] reports good support system, ability to afford medications and transportation available for all post-op medical appointments.  TOC will follow for additional needs.    Expected Discharge Plan: [place] Barriers to Discharge: Continued Medical Work up   Patient Goals and CMS Choice Patient states their goals for this hospitalization and ongoing recovery are:: to go home CMS Medicare.gov Compare Post Acute Care list provided to:: Patient Choice offered to / list presented to : Patient  Expected Discharge Plan and Services Expected Discharge Plan: [place]       Living arrangements for the past 2 months: Single Family Home                                      Prior Living Arrangements/Services Living arrangements for the past 2 months: Single Family Home Lives with:: Self                   Activities of Daily Living      Permission Sought/Granted                  Emotional Assessment              Admission diagnosis:  Acute on chronic diastolic heart failure (HCC) [I50.33] Acute on chronic congestive heart failure, unspecified heart failure type [facility]) [I50.9] Patient Active Problem List   Diagnosis Date Noted   Acute on chronic diastolic heart failure ([place]) 12/09/2021   New onset atrial fibrillation ([place]) 11/16/2021   Acute anemia 11/16/2021   Acute respiratory failure with hypoxia ([place]) 11/16/2021   Elevated temperature 11/16/2021   Aortic atherosclerosis ([place]) 08/02/2020    Fall 12/29/2019   Weakness 06/09/2019   Wheezing 04/16/2019   Back skin lesion 12/04/2018   Restless legs syndrome 11/25/2018   PAD (peripheral artery disease) ([place]) 04/07/2018   Cold foot 05/15/2017   Constipation 08/15/2016   AKI (acute kidney injury) ([place]) 04/13/2016   Small bowel obstruction ([place]) 11/24/2015   Swelling of left lower extremity 07/16/2015   Acute Ileitis 06/12/2015   Hypokalemia 06/12/2015   Accelerated hypertension 06/12/2015   Hereditary hemochromatosis ([place]) 05/25/2015   Open leg wound 05/19/2015   Lower extremity edema 05/19/2015   Iron excess 04/16/2015   Dizziness 10/26/2014   Hyperbilirubinemia 10/26/2014   Health care maintenance 05/18/2014   Anemia, iron deficiency 04/21/2014   Hospital discharge follow-up 04/14/2014   Hot flashes 03/07/2014   Face lesion 11/03/2013   Numbness in feet 10/08/2012   Nocturia 10/08/2012   Hypertension 12/18/2011   Hypercholesteremia 12/18/2011   History of colon cancer 12/18/2011   Barrett's esophagus 12/18/2011   Chronic back pain 12/18/2011   Osteopenia 12/18/2011   PCP:  [name], MD Pharmacy:   [facility] Delivery - [place], [place] [address] Phone: [phone]  Fax: [place], [place] - [place] AT [facility] [address] [place] [address] Phone: [phone] Fax: [phone]     Social Determinants of Health (SDOH) Interventions    Readmission Risk Interventions     No data to display

## 2021-12-10 NOTE — Progress Notes (Addendum)
Critical lactic 2.0 called to the unit and Sharion Settler NP notified.

## 2021-12-10 NOTE — Hospital Course (Signed)
88 y.o. female with past medical history of CHF, PAD, hypertension, atrial fibrillation admitted for acute on chronic diastolic heart failure  18/98: PT, OT eval

## 2021-12-10 NOTE — Evaluation (Signed)
Occupational Therapy Evaluation Patient Details Name: [name] MRN: [id number] DOB: 10-31-1928 Today's Date: 12/10/2021   History of Present Illness [name] is a 41yoF who comes back to [facility] after less than 1 week post DC after sudden severe weakness at home and fall to the floor while AMB in house. Hypoxic to EMS, pt is requiring supplemental O2 here. PMH: dCHF, HTN, PAD, colon cancer s/p resection x2, back pain. At baseline pt uses a 4WW for limited household distances of 40-60', requires physical assist for all transfers. At lat admission pt seen by PT on 11/20/21 where she was thought to have been able to benefit from high-frequency services and ADL support at DC, however family elected to take pt home and meet her needs there. Per record she was working with [facility] services in the interim.   Clinical Impression   Pt was seen for OT evaluation this date. Of note, pt seen by this therapist a couple weeks ago for admission. Pt lives her spouse who provides pt with assist along with 4 daughters who live locally to assist as well. At baseline, pt primarily sits and sleeps in a lift recliner, requiring assist form lift chair and family for all ADL transfers, most ADL and all IADL. Pt presents to acute OT demonstrating impaired ADL performance and functional mobility 2/2 decreased strength, activity tolerance, and balance (See OT problem list). Pt currently requires MIN-MOD A +2 for initial ADL transfer attempt with RW from recliner and MOD-MAX A +2 for 2nd attempt as pt fatigued. SpO2 88-89% on 2L briefly with exertion, improving quickly to >93% with PLB. Pt would benefit from skilled OT services to address noted impairments and functional limitations (see below for any additional details) in order to maximize safety and independence while minimizing falls risk and caregiver burden. Upon hospital discharge, recommend HHOT to maximize pt safety and return to functional independence during meaningful  occupations of daily life.      Recommendations for follow up therapy are one component of a multi-disciplinary discharge planning process, led by the attending physician.  Recommendations may be updated based on patient status, additional functional criteria and insurance authorization.   Follow Up Recommendations  Home health OT    Assistance Recommended at Discharge Frequent or constant Supervision/Assistance  Patient can return home with the following Two people to help with walking and/or transfers;A lot of help with bathing/dressing/bathroom;Assistance with cooking/housework;Assist for transportation;Help with stairs or ramp for entrance;Direct supervision/assist for medications management;Direct supervision/assist for financial management    Functional Status Assessment  Patient has had a recent decline in their functional status and/or demonstrates limited ability to make significant improvements in function in a reasonable and predictable amount of time  Equipment Recommendations  Other (comment) (transport chair)    Recommendations for Other Services       Precautions / Restrictions Precautions Precautions: Fall Restrictions Weight Bearing Restrictions: No      Mobility Bed Mobility               General bed mobility comments: NT, up in recliner    Transfers Overall transfer level: Needs assistance Equipment used: Rolling walker (2 wheels) Transfers: Sit to/from Stand Sit to Stand: Min assist, Mod assist, Max assist, +2 physical assistance           General transfer comment: MIN-MOD A +2 for 1st attempt with VC for hand placement, foot placement; MOD-MAX  +2 for 2nd attempt 2/2 fatigue      Balance Overall balance  assessment: Needs assistance Sitting-balance support: Single extremity supported, Feet supported, Bilateral upper extremity supported Sitting balance-Leahy Scale: Fair     Standing balance support: Bilateral upper extremity supported,  Reliant on assistive device for balance Standing balance-Leahy Scale: Poor                             ADL either performed or assessed with clinical judgement   ADL                                         General ADL Comments: Pt currently requires MAX A for LB ADL tasks (suspect this is near baseline), MIN A for seated UB ADL tasks, and grossly MOD A +2 for ADL transfers.     Vision         Perception     Praxis      Pertinent Vitals/Pain Pain Assessment Pain Assessment: No/denies pain     Hand Dominance     Extremity/Trunk Assessment Upper Extremity Assessment Upper Extremity Assessment: Generalized weakness   Lower Extremity Assessment Lower Extremity Assessment: Generalized weakness (noted some edema/fluid pockets)   Cervical / Trunk Assessment Cervical / Trunk Assessment: Kyphotic (L lateral lean - at baseline)   Communication Communication Communication: HOH   Cognition Arousal/Alertness: Awake/alert Behavior During Therapy: WFL for tasks assessed/performed Overall Cognitive Status: No family/caregiver present to determine baseline cognitive functioning                                 General Comments: Pt endorses difficulty with memory, able to follow VC well for sequencing     General Comments       Exercises Other Exercises Other Exercises: Pt instructed in ADL transfers with RW, x2, with VC and blocking feet, VC for anterior weight shift, upright posture as she tends to remain hunched over. Pt tolerated standing <30 sec each time before endorsing significant fatigue. SpO2 down to 88-89% briefly on 2L, improving to >93% within 64mn with PRN VC for PLB.   Shoulder Instructions      Home Living Family/patient expects to be discharged to:: Private residence Living Arrangements: Spouse/significant other Available Help at Discharge: Family;Available 24 hours/day;Available PRN/intermittently (4 DTRs partake in  caregiver duties) Type of Home: House Home Access: Stairs to enter;Ramped entrance Entrance Stairs-Number of Steps: 1   Home Layout: One level     Bathroom Shower/Tub: WOccupational psychologist Handicapped height     Home Equipment: [place](4 wheels);Shower seat;Hand held shower head;Other (comment)   Additional Comments: Pt sleeps in a lift recliner and spends majority of her time in lift recliner, uses it to stand her up      Prior Functioning/Environment Prior Level of Function : Needs assist;History of Falls (last six months)             Mobility Comments: Pt requires lift recliner and/or family to assist with transfers, using rollator for walking short distances (was previously able to walk 50-60' at a time but very recently has only been able to walk 10-12' with assist from family). Per family, a w/c will not fit through doorways at home. ADLs Comments: Pt requires assist for LB dressing, bathing from seated position, meals, housekeeping, driving, and med mgt. Daughters come nearly daily to provide  meals and assist with housekeeping tasks and transportation. Great family support available.        OT Problem List: Decreased strength;Decreased activity tolerance;Decreased knowledge of use of DME or AE;Impaired balance (sitting and/or standing)      OT Treatment/Interventions: Self-care/ADL training;Therapeutic exercise;Therapeutic activities;Patient/family education;DME and/or AE instruction;Balance training;Energy conservation    OT Goals(Current goals can be found in the care plan section) Acute Rehab OT Goals Patient Stated Goal: get better OT Goal Formulation: With patient Time For Goal Achievement: 12/24/21 Potential to Achieve Goals: Good ADL Goals Pt Will Transfer to Toilet: squat pivot transfer;stand pivot transfer;bedside commode;with mod assist (LRAD, caregiver indep in assisting) Pt Will Perform Toileting - Clothing Manipulation and hygiene:  sitting/lateral leans;with min assist;with caregiver independent in assisting Additional ADL Goal #1: Pt will stand from elevated surface with RW and MOD A from caregivers in anticipation of ADL/transfers requiring PRN VC for sequencing, 3/3 opportunities.  OT Frequency: Min 2X/week    Co-evaluation              AM-PAC OT "6 Clicks" Daily Activity     Outcome Measure Help from another person eating meals?: None Help from another person taking care of personal grooming?: A Little Help from another person toileting, which includes using toliet, bedpan, or urinal?: Total Help from another person bathing (including washing, rinsing, drying)?: A Lot Help from another person to put on and taking off regular upper body clothing?: A Little Help from another person to put on and taking off regular lower body clothing?: A Lot 6 Click Score: 15   End of Session Equipment Utilized During Treatment: Oxygen;Rolling walker (2 wheels)  Activity Tolerance: Patient tolerated treatment well Patient left: in chair;with call bell/phone within reach;with chair alarm set  OT Visit Diagnosis: Other abnormalities of gait and mobility (R26.89);History of falling (Z91.81);Muscle weakness (generalized) (M62.81)                Time: 2876-8115 OT Time Calculation (min): 19 min Charges:  OT General Charges $OT Visit: 1 Visit OT Evaluation $OT Eval Moderate Complexity: 1 Mod OT Treatments $Therapeutic Activity: 8-22 mins  Ardeth Perfect., MPH, MS, OTR/L ascom [phone] 12/10/21, 3:38 PM

## 2021-12-10 NOTE — Evaluation (Signed)
Physical Therapy Evaluation Patient Details Name: [name] MRN: [id number] DOB: 1928-05-15 Today's Date: 12/10/2021  History of Present Illness  [name] is a 42yoF who comes back to [facility] after less than 1 week post DC after sudden severe weakness at home and fall to the floor while AMB in house. Hypoxic to EMS, pt is requiring supplemental O2 here. PMH: dCHF, HTN, PAD, colon cancer s/p resection x2, back pain. At baseline pt uses a 4WW for limited household distances of 40-60', requires physical assist for all transfers. At lat admission pt seen by PT on 11/20/21 where she was thought to have been able to benefit from high-frequency services and ADL support at DC, however family elected to take pt home and meet her needs there. Per record she was working with [facility] services in the interim.  Clinical Impression  Pt awake, again very pleasant, does nto recall author from prior admission 20 days ago. Pt gives full narrative about her collapse to floor PTA, her sudden weakness and legs giving out, denies dizziness, but cannot say what role her chronic knee pain may have played a role. She remains easily exerted with delayed recovery of dyspnea >2 minutes, unfortunately no vital signs that help correlate driving factors for her dyspnea, as she remains adequately saturate and rate controled, albeit in AF. Pt partakes in multiple transfers at bedside, limited standing time, and requires maxA each time. Pt assisted to recliner per request, reports to be very comfortable here, agreeable to attempt up for ~3 hours. Call bell provided, chair alarm in place. All needs met.      Recommendations for follow up therapy are one component of a multi-disciplinary discharge planning process, led by the attending physician.  Recommendations may be updated based on patient status, additional functional criteria and insurance authorization.  Follow Up Recommendations Home health PT (Pt at similar function level prior  admission, family elected to meet her needs at home.)      Assistance Recommended at Discharge Intermittent Supervision/Assistance  Patient can return home with the following  Two people to help with walking and/or transfers;A lot of help with bathing/dressing/bathroom;Assist for transportation;Help with stairs or ramp for entrance;Assistance with cooking/housework    Equipment Recommendations  (would still benefit from transport chair at DC, pt stil remains unsafe to AMB at this time. Unclear if this was fulfilled prior admission.)  Recommendations for Other Services       Functional Status Assessment Patient has had a recent decline in their functional status and demonstrates the ability to make significant improvements in function in a reasonable and predictable amount of time.     Precautions / Restrictions Precautions Precautions: Fall Restrictions Weight Bearing Restrictions: No      Mobility  Bed Mobility Overal bed mobility: Needs Assistance Bed Mobility: Supine to Sit     Supine to sit: Max assist     General bed mobility comments: fervent attempts over gets 50% there over 2 segments with a midportion recovery interval due to increased dyspnea.    Transfers Overall transfer level: Needs assistance Equipment used: None Transfers: Sit to/from Stand, Bed to chair/wheelchair/BSC Sit to Stand: Mod assist     Squat pivot transfers: Max assist     General transfer comment: hug assist transfer, pt holds onto my elbows real good    Ambulation/Gait Ambulation/Gait assistance:  (not appropriate, unable to take steps during transfer, toelrates <15sec standing.)  Stairs            Wheelchair Mobility    Modified Rankin (Stroke Patients Only)       Balance                                             Pertinent Vitals/Pain Pain Assessment Pain Assessment: Faces Faces Pain Scale: Hurts little more Pain Location:  Rt wrist is sore s/p fall Pain Descriptors / Indicators: Aching Pain Intervention(s): Limited activity within patient's tolerance, Monitored during session, Premedicated before session    [place] expects to be discharged to:: Private residence Living Arrangements: Spouse/significant other Available Help at Discharge: Family;Available 24 hours/day;Available PRN/intermittently (4 DTRs partake in caregiver duties) Type of Home: House Home Access: Stairs to enter;Ramped entrance   Entrance Stairs-Number of Steps: 1   Home Layout: One level Home Equipment: Rollator (4 wheels);Shower seat;Hand held shower head;Other (comment) Additional Comments: Pt sleeps in a lift recliner and spends majority of her time in lift recliner, uses it to stand her up    Prior Function Prior Level of Function : Needs assist;History of Falls (last six months)             Mobility Comments: Pt requires lift recliner and/or family to assist with transfers, using rollator for walking short distances (was previously able to walk 50-60' at a time but very recently has only been able to walk 10-12' with assist from family). Per family, a w/c will not fit through doorways at home. ADLs Comments: Pt requires assist for LB dressing, bathing from seated position, meals, housekeeping, driving, and med mgt. Daughters come nearly daily to provide meals and assist with housekeeping tasks and transportation. Great family support available.     Hand Dominance        Extremity/Trunk Assessment             Cervical / Trunk Assessment Cervical / Trunk Assessment: Kyphotic (left lateral lean, she says this is baseline, appears orthopedic)  Communication   Communication: HOH  Cognition Arousal/Alertness: Awake/alert Behavior During Therapy: WFL for tasks assessed/performed Overall Cognitive Status: Within Functional Limits for tasks assessed                                 General  Comments: Pt demos some difficulty with STM, endorses being able to do a bit more for herself than family/spouse reports she is able to do. Follows commands with cues.        General Comments      Exercises Other Exercises Other Exercises: sitting at EOB, breathing, postural correction, cues to DC Left elbow support x15 minutes Other Exercises: STS from EOB, hug dependent transfer 3x~10sec   Assessment/Plan    PT Assessment Patient needs continued PT services  PT Problem List Decreased strength;Decreased range of motion;Decreased activity tolerance;Decreased balance;Decreased mobility;Decreased knowledge of use of DME;Decreased knowledge of precautions       PT Treatment Interventions DME instruction;Balance training;Gait training;Stair training;Functional mobility training;Therapeutic activities;Therapeutic exercise;Patient/family education    PT Goals (Current goals can be found in the Care Plan section)  Acute Rehab PT Goals Patient Stated Goal: regain ability to perform household AMB PT Goal Formulation: With patient Time For Goal Achievement: 12/24/21 Potential to Achieve Goals: Good    Frequency Min 2X/week  Co-evaluation               AM-PAC PT "6 Clicks" Mobility  Outcome Measure Help needed turning from your back to your side while in a flat bed without using bedrails?: Total Help needed moving from lying on your back to sitting on the side of a flat bed without using bedrails?: Total Help needed moving to and from a bed to a chair (including a wheelchair)?: Total Help needed standing up from a chair using your arms (e.g., wheelchair or bedside chair)?: Total Help needed to walk in hospital room?: Total Help needed climbing 3-5 steps with a railing? : Total 6 Click Score: 6    End of Session Equipment Utilized During Treatment: Oxygen Activity Tolerance: Patient tolerated treatment well;Patient limited by fatigue Patient left: in chair;with call  bell/phone within reach;with chair alarm set Nurse Communication: Mobility status PT Visit Diagnosis: Unsteadiness on feet (R26.81);Difficulty in walking, not elsewhere classified (R26.2);Muscle weakness (generalized) (M62.81)    Time: 8421-0312 PT Time Calculation (min) (ACUTE ONLY): 39 min   Charges:   PT Evaluation $PT Eval Moderate Complexity: 1 Mod PT Treatments $Therapeutic Activity: 8-22 mins       2:41 PM, 12/10/21 Etta Grandchild, PT, DPT Physical Therapist - [facility]  [phone] ([place])    [place] C 12/10/2021, 2:36 PM

## 2021-12-10 NOTE — Consult Note (Signed)
   Heart Failure Nurse Navigator Note  HFpEF 50-55%. Mild LVH.  Mild to moderate mitral regurgitation.  Mild to moderate tricuspid regurgitation.  She presented to the emergency room from home with complaints of dyspnea and shortness of breath.  EMS was initially called because she had slid to the floor and she could not get up and with the help of her husband.  EMS felt that she was quite short of breath, with saturations of 84%.  BNP 406.    Comorbidities:  Anemia Hyperlipidemia Hypertension Osteoarthritis Peripheral arterial disease  Medications:  Amlodipine 5 mg daily Furosemide 40 mg IV every 12 Irbesartan 300 mg daily Metoprolol succinate 25 mg 2 times a day Potassium chloride 10 mEq 2 times a day Simvastatin 10 mg daily  Labs:  Sodium 138 potassium 4, chloride 105, CO2 23, BUN 30, creatinine 1.18, GFR 44. Weight is 73.7 kg Blood pressure 118/58 Intake not documented Output 600 mL  Initial meeting with patient, her husband and daughter [name] who are at the bedside.  Patient had last been discharged from this facility on November 20, 2021 after she was admitted with heart failure.  She states at home that she has had a good appetite, she does use salt at the table.  She states some of the meals were home-cooked and some came from restaurants.  Stressed making wise choices when [employer].  Patient does not weigh herself daily as she is too shaky to get an accurate read.  Discussed with them if they had a flat bottom hard chair to set the scale on the chair and that she could sit on that and get baseline and then track her weights from that baseline weight.  They voiced understanding.  Over fluid restriction, daughter voiced that she did not feel that her mom was getting anywhere near the 64 ounces she felt that it was more like 32 ounces daily.  They also voiced concerns that patient was very weak and deconditioned when sent home last time would like to get PT  involved as soon as possible.  Dr. Brigitte [name] and patient's nurse made aware.  Discussed follow-up in the outpatient heart failure clinic, last time they had canceled the appointment.  I again stressed follow-up with the heart failure clinic would help and keeping [name] out of the hospital.  She has been given a appointment for November 8 at 2:30 in the afternoon she has a 1% no-show ratio.  They had no further questions at this time.  Katrina Riffle RN [facility]

## 2021-12-11 DIAGNOSIS — I5033 Acute on chronic diastolic (congestive) heart failure: Secondary | ICD-10-CM | POA: Diagnosis not present

## 2021-12-11 DIAGNOSIS — I509 Heart failure, unspecified: Secondary | ICD-10-CM | POA: Diagnosis not present

## 2021-12-11 DIAGNOSIS — N179 Acute kidney failure, unspecified: Secondary | ICD-10-CM | POA: Diagnosis not present

## 2021-12-11 DIAGNOSIS — D509 Iron deficiency anemia, unspecified: Secondary | ICD-10-CM | POA: Diagnosis not present

## 2021-12-11 LAB — CBC
HCT: 29 % — ABNORMAL LOW (ref 36.0–46.0)
Hemoglobin: 9.3 g/dL — ABNORMAL LOW (ref 12.0–15.0)
MCH: 31.8 pg (ref 26.0–34.0)
MCHC: 32.1 g/dL (ref 30.0–36.0)
MCV: 99.3 fL (ref 80.0–100.0)
Platelets: 276 10*3/uL (ref 150–400)
RBC: 2.92 MIL/uL — ABNORMAL LOW (ref 3.87–5.11)
RDW: 14.5 % (ref 11.5–15.5)
WBC: 8.2 10*3/uL (ref 4.0–10.5)
nRBC: 0 % (ref 0.0–0.2)

## 2021-12-11 LAB — BASIC METABOLIC PANEL
Anion gap: 9 (ref 5–15)
BUN: 41 mg/dL — ABNORMAL HIGH (ref 8–23)
CO2: 26 mmol/L (ref 22–32)
Calcium: 8.8 mg/dL — ABNORMAL LOW (ref 8.9–10.3)
Chloride: 102 mmol/L (ref 98–111)
Creatinine, Ser: 1.52 mg/dL — ABNORMAL HIGH (ref 0.44–1.00)
GFR, Estimated: 32 mL/min — ABNORMAL LOW (ref >60)
Glucose, Bld: 122 mg/dL — ABNORMAL HIGH (ref 70–99)
Potassium: 4.2 mmol/L (ref 3.5–5.1)
Sodium: 137 mmol/L (ref 135–145)

## 2021-12-11 MED ORDER — ENOXAPARIN SODIUM 30 MG/0.3ML IJ SOSY: 30.0000 mg | PREFILLED_SYRINGE | INTRAMUSCULAR | Status: DC

## 2021-12-11 MED ORDER — FUROSEMIDE 20 MG PO TABS: 20.0000 mg | ORAL_TABLET | Freq: Every day | ORAL | 0 refills | Status: AC

## 2021-12-11 NOTE — Discharge Summary (Signed)
Physician Discharge Summary   Patient: [name] MRN: [id number] DOB: 12/03/1928  Admit date:     12/09/2021  Discharge date: 12/11/21  Discharge Physician: Max Sane   PCP: [name], MD   Recommendations at discharge:   Follow-up with outpatient providers as requested  Discharge Diagnoses: Principal Problem:   Acute on chronic diastolic heart failure (HCC) Active Problems:   AKI (acute kidney injury) ([place])   Anemia, iron deficiency   Weakness   Acute on chronic congestive heart failure [facility])  Hospital Course: 88 y.o. female with past medical history of CHF, PAD, hypertension, atrial fibrillation admitted for acute on chronic diastolic heart failure  56/25: PT, OT eval  Assessment and Plan: * Acute on chronic diastolic heart failure ([place]) Patient with known HFpEF, last ECHO 11/17/21 EF 50-55% mild LVH, diastolic fx indeterminent. Admitted 10/3-10/7/23 for HFpEF. At discharge mild renal insufficiency.  -Continue lasix 20 mg daily at discharge - Continue other home cardiac meds.  Outpatient cardiology follow-up  AKI (acute kidney injury) [facility]) Patient with mild AKI with rise of Cr to 1.22 from 1.14, most likely related to diuretics.  Lab Results  Component Value Date   CREATININE 1.18 (H) 12/10/2021   CREATININE 1.22 (H) 12/09/2021   CREATININE 1.14 (H) 11/19/2021  Continue low dose lasix at discharge for HFpEF  Anemia, iron deficiency Anemia at at baseline but Hgb has gone from 11.5 on 11/18/21 to 9.1. Only blood loss is ecchymosis right arm. Monitor H&H.  No obvious bleed  Weakness Likely multifactorial.  PT and OT recommends home health which has been set up by TOC.  Family in agreement         Disposition: Home Diet recommendation:  Discharge Diet Orders (From admission, onward)     Start     Ordered   12/11/21 0000  Diet - low sodium heart healthy        12/11/21 1129           Carb modified diet DISCHARGE MEDICATION: Allergies as  of 12/11/2021       Reactions   Fentanyl Other (See Comments)   confusion        Medication List     STOP taking these medications    apixaban 5 MG Tabs tablet Commonly known as: ELIQUIS   meclizine 12.5 MG tablet Commonly known as: ANTIVERT   mometasone 0.1 % cream Commonly known as: ELOCON       TAKE these medications    albuterol 108 (90 Base) MCG/ACT inhaler Commonly known as: VENTOLIN HFA TAKE 2 PUFFS BY MOUTH EVERY 6 HOURS AS NEEDED FOR WHEEZE OR SHORTNESS OF BREATH   amLODipine 5 MG tablet Commonly known as: NORVASC TAKE 1 TABLET EVERY DAY   calcium carbonate 1500 (600 Ca) MG Tabs tablet Commonly known as: OSCAL Take 600 mg of elemental calcium by mouth 2 (two) times daily with a meal.   diclofenac Sodium 1 % Gel Commonly known as: VOLTAREN Apply 2 g topically 3 (three) times daily.   docusate sodium 100 MG capsule Commonly known as: COLACE Take 100 mg by mouth 2 (two) times daily.   fexofenadine 60 MG tablet Commonly known as: ALLEGRA TAKE 1 TABLET EVERY DAY   furosemide 20 MG tablet Commonly known as: Lasix Take 1 tablet (20 mg total) by mouth daily. What changed: Another medication with the same name was removed. Continue taking this medication, and follow the directions you see here.   gabapentin 100 MG capsule Commonly known  as: NEURONTIN TAKE 2 CAPSULES AT BEDTIME   irbesartan 300 MG tablet Commonly known as: AVAPRO TAKE 1 TABLET EVERY DAY   metoCLOPramide 5 MG tablet Commonly known as: REGLAN Take 5 mg by mouth 2 (two) times daily.   metoprolol succinate 25 MG 24 hr tablet Commonly known as: TOPROL-XL TAKE 1 TABLET TWICE DAILY   ondansetron 4 MG disintegrating tablet Commonly known as: Zofran ODT Take 1 tablet (4 mg total) by mouth 2 (two) times daily as needed for nausea or vomiting.   optichamber diamond Misc   oxyCODONE 5 MG immediate release tablet Commonly known as: Oxy IR/ROXICODONE Take 5 mg by mouth 3 (three) times  daily.   pantoprazole 40 MG tablet Commonly known as: PROTONIX Take 40 mg by mouth 2 (two) times daily.   simvastatin 10 MG tablet Commonly known as: ZOCOR TAKE 1 TABLET (10 MG TOTAL) AT BEDTIME.   traMADol 50 MG tablet Commonly known as: ULTRAM Take 1 tablet by mouth at bedtime as needed.   Vitamin D3 50 MCG (2000 UT) capsule Take 2,000 Units by mouth daily.        Follow-up Information     [name], MD. Schedule an appointment as soon as possible for a visit in 1 week(s).   Specialty: Internal Medicine Why: [facility] Discharge F/UP Contact information: [address] [place] [address] [phone]                Discharge Exam: Danley Danker Weights   12/09/21 6389 12/10/21 0523 12/11/21 3734  Weight: 77.7 kg 73.7 kg 27.31 kg   88 year old female sitting in the chair comfortably without any acute distress Lungs clear to auscultation bilaterally Cardiovascular irregularly irregular heart rate Abdomen soft, benign Extremities 1+ pitting edema bilaterally.  Large ecchymosis on the right forearm, fluid blisters in the bilateral lower extremity Neuro alert and awake, slow cognition, nonfocal  Condition at discharge: fair  The results of significant diagnostics from this hospitalization (including imaging, microbiology, ancillary and laboratory) are listed below for reference.   Imaging Studies: DG Chest Port 1 View  Result Date: 12/09/2021 CLINICAL DATA:  sob, chf EXAM: PORTABLE CHEST 1 VIEW COMPARISON:  Chest x-ray November 16, 2021. FINDINGS: Mildly improved asymmetric patchy left interstitial and airspace opacities. Small left pleural effusions. No visible pneumothorax. Mildly enlarged cardiac silhouette. IMPRESSION: 1. Mildly improved asymmetric patchy left interstitial and airspace opacities, which could represent asymmetric edema or pneumonia. 2. Small left pleural effusion. Electronically Signed   By: Margaretha Sheffield M.D.   On:  12/09/2021 09:37   ECHOCARDIOGRAM COMPLETE  Result Date: 11/17/2021    ECHOCARDIOGRAM REPORT   Patient Name:   [name] Date of Exam: 11/17/2021 Medical Rec #:  [id number]     Height:       64.0 in Accession #:    [id number]    Weight:       160.0 lb Date of Birth:  July 23, 1928    BSA:          1.779 m Patient Age:    88 years      BP:           172/86 mmHg Patient Gender: F             HR:           80 bpm. Exam Location:  [facility] Procedure: 2D Echo, Cardiac Doppler and Color Doppler Indications:     CHF-acute diastolic B63.84  History:  Patient has prior history of Echocardiogram examinations, most                  recent 04/23/2020. Risk Factors:Hypertension.  Sonographer:     Sherrie Sport Referring Phys:  [id number] Athena Masse Diagnosing Phys: Isaias Cowman MD IMPRESSIONS  1. Left ventricular ejection fraction, by estimation, is 50 to 55%. The left ventricle has low normal function. The left ventricle has no regional wall motion abnormalities. There is mild left ventricular hypertrophy. Left ventricular diastolic parameters were normal.  2. Right ventricular systolic function is normal. The right ventricular size is normal.  3. The mitral valve is normal in structure. Mild to moderate mitral valve regurgitation. No evidence of mitral stenosis.  4. Tricuspid valve regurgitation is mild to moderate.  5. The aortic valve is normal in structure. Aortic valve regurgitation is mild to moderate. No aortic stenosis is present.  6. The inferior vena cava is normal in size with greater than 50% respiratory variability, suggesting right atrial pressure of 3 mmHg. FINDINGS  Left Ventricle: Left ventricular ejection fraction, by estimation, is 50 to 55%. The left ventricle has low normal function. The left ventricle has no regional wall motion abnormalities. The left ventricular internal cavity size was normal in size. There is mild left ventricular hypertrophy. Left ventricular diastolic parameters were  normal. Right Ventricle: The right ventricular size is normal. No increase in right ventricular wall thickness. Right ventricular systolic function is normal. Left Atrium: Left atrial size was normal in size. Right Atrium: Right atrial size was normal in size. Pericardium: There is no evidence of pericardial effusion. Mitral Valve: The mitral valve is normal in structure. Mild to moderate mitral valve regurgitation. No evidence of mitral valve stenosis. Tricuspid Valve: The tricuspid valve is normal in structure. Tricuspid valve regurgitation is mild to moderate. No evidence of tricuspid stenosis. Aortic Valve: The aortic valve is normal in structure. Aortic valve regurgitation is mild to moderate. No aortic stenosis is present. Aortic valve mean gradient measures 4.0 mmHg. Aortic valve peak gradient measures 8.1 mmHg. Aortic valve area, by VTI measures 2.54 cm. Pulmonic Valve: The pulmonic valve was normal in structure. Pulmonic valve regurgitation is not visualized. No evidence of pulmonic stenosis. Aorta: The aortic root is normal in size and structure. Venous: The inferior vena cava is normal in size with greater than 50% respiratory variability, suggesting right atrial pressure of 3 mmHg. IAS/Shunts: No atrial level shunt detected by color flow Doppler.  LEFT VENTRICLE PLAX 2D LVIDd:         3.60 cm   Diastology LVIDs:         2.60 cm   LV e' medial:    18.10 cm/s LV PW:         1.20 cm   LV E/e' medial:  7.8 LV IVS:        1.20 cm   LV e' lateral:   6.96 cm/s LVOT diam:     2.00 cm   LV E/e' lateral: 20.3 LV SV:         65 LV SV Index:   37 LVOT Area:     3.14 cm  RIGHT VENTRICLE RV Basal diam:  2.80 cm RV Mid diam:    2.40 cm RV S prime:     13.90 cm/s TAPSE (M-mode): 3.4 cm LEFT ATRIUM             Index        RIGHT ATRIUM  Index LA diam:        4.30 cm 2.42 cm/m   RA Area:     12.00 cm LA Vol (A2C):   52.9 ml 29.73 ml/m  RA Volume:   22.50 ml  12.64 ml/m LA Vol (A4C):   52.7 ml 29.62 ml/m LA  Biplane Vol: 55.2 ml 31.02 ml/m  AORTIC VALVE AV Area (Vmax):    2.32 cm AV Area (Vmean):   2.37 cm AV Area (VTI):     2.54 cm AV Vmax:           142.00 cm/s AV Vmean:          93.600 cm/s AV VTI:            0.256 m AV Peak Grad:      8.1 mmHg AV Mean Grad:      4.0 mmHg LVOT Vmax:         105.00 cm/s LVOT Vmean:        70.700 cm/s LVOT VTI:          0.207 m LVOT/AV VTI ratio: 0.81  AORTA Ao Root diam: 2.70 cm MITRAL VALVE                TRICUSPID VALVE MV Area (PHT): 4.17 cm     TR Peak grad:   49.3 mmHg MV Decel Time: 182 msec     TR Vmax:        351.00 cm/s MV E velocity: 141.00 cm/s MV A velocity: 50.80 cm/s   SHUNTS MV E/A ratio:  2.78         Systemic VTI:  0.21 m                             Systemic Diam: 2.00 cm Isaias Cowman MD Electronically signed by Isaias Cowman MD Signature Date/Time: 11/17/2021/1:19:05 PM    Final    DG Chest Port 1 View  Result Date: 11/16/2021 CLINICAL DATA:  Shortness of breath. EXAM: PORTABLE CHEST 1 VIEW COMPARISON:  April 16, 2020. FINDINGS: Stable cardiomegaly with mild central pulmonary vascular congestion. Probable mild bilateral perihilar and basilar pulmonary edema is noted. Small pleural effusions are noted. Bony thorax is unremarkable. IMPRESSION: Stable cardiomegaly with mild central pulmonary vascular congestion and probable mild bilateral pulmonary edema. Small pleural effusions. Electronically Signed   By: Marijo Conception M.D.   On: 11/16/2021 20:37    Microbiology: Results for orders placed or performed during the hospital encounter of 12/09/21  Blood culture (routine x 2)     Status: None (Preliminary result)   Collection Time: 12/09/21 10:00 AM   Specimen: BLOOD  Result Value Ref Range Status   Specimen Description BLOOD RIGHT FA  Final   Special Requests   Final    BOTTLES DRAWN AEROBIC AND ANAEROBIC Blood Culture adequate volume   Culture   Final    NO GROWTH < 24 HOURS Performed at [facility], [address]  [place], [place] [address]    Report Status PENDING  Incomplete  Blood culture (routine x 2)     Status: None (Preliminary result)   Collection Time: 12/09/21 10:00 AM   Specimen: BLOOD  Result Value Ref Range Status   Specimen Description BLOOD RIGHT FA  Final   Special Requests   Final    BOTTLES DRAWN AEROBIC AND ANAEROBIC Blood Culture results may not be optimal due to an excessive volume of blood received  in culture bottles   Culture   Final    NO GROWTH < 24 HOURS Performed at [facility], [address] [place], [place] [address]    Report Status PENDING  Incomplete    Labs: CBC: Recent Labs  Lab 12/09/21 1013 12/11/21 0556  WBC 8.6 8.2  HGB 9.1* 9.3*  HCT 29.1* 29.0*  MCV 100.3* 99.3  PLT 228 381   Basic Metabolic Panel: Recent Labs  Lab 12/09/21 1013 12/10/21 0400 12/11/21 0556  NA 139 138 137  K 3.7 4.0 4.2  CL 106 105 102  CO2 '23 23 26  '$ GLUCOSE 122* 107* 122*  BUN 34* 30* 41*  CREATININE 1.22* 1.18* 1.52*  CALCIUM 8.8* 8.7* 8.8*   Liver Function Tests: Recent Labs  Lab 12/09/21 1013  AST 14*  ALT 10  ALKPHOS 46  BILITOT 1.2  PROT 6.3*  ALBUMIN 3.1*   CBG: No results for input(s): "GLUCAP" in the last 168 hours.  Discharge time spent: greater than 30 minutes.  Signed: Max Sane, MD [facility] 12/11/2021

## 2021-12-11 NOTE — TOC Transition Note (Signed)
Transition of Care [facility]) - CM/SW Discharge Note   Patient Details  Name: [name] MRN: [id number] Date of Birth: [birth year]  [facility] of [facility]) CM/SW Contact:  Alberteen Sam, LCSW Phone Number: 12/11/2021, 11:29 AM   Clinical Narrative:     Patient to dc home with [facility] services, no dme and was able to wean off O2 currently on RA per RN.   Cory with [facility] informed of dc today, no further needs.   Final next level of care: [place] Barriers to Discharge: No Barriers Identified   Patient Goals and CMS Choice Patient states their goals for this hospitalization and ongoing recovery are:: to go home CMS Medicare.gov Compare Post Acute Care list provided to:: Patient Choice offered to / list presented to : Patient  Discharge Placement                       Discharge Plan and Services                                     Social Determinants of Health (SDOH) Interventions     Readmission Risk Interventions     No data to display

## 2021-12-11 NOTE — Progress Notes (Signed)
PHARMACIST - PHYSICIAN COMMUNICATION  CONCERNING:  Enoxaparin (Lovenox) for DVT Prophylaxis    RECOMMENDATION: Patient was prescribed enoxaparin '40mg'$  q24 hours for VTE prophylaxis.   Filed Weights   12/09/21 0851 12/10/21 0523 12/11/21 0628  Weight: 77.7 kg (171 lb 4.8 oz) 73.7 kg (162 lb 7.7 oz) 71.6 kg (157 lb 13.6 oz)    Body mass index is 27.96 kg/m.  Estimated Creatinine Clearance: 23.3 mL/min (A) (by C-G formula based on SCr of 1.52 mg/dL (H)).  Patient is candidate for enoxaparin '30mg'$  every 24 hours based on CrCl <59m/min or Weight <45kg  DESCRIPTION: Pharm[facility]  Patient is now receiving enoxaparin 30 mg every 24 hours   ABenita Gutter10/28/2023 8:38 AM

## 2021-12-13 ENCOUNTER — Emergency Department: Payer: Medicare HMO

## 2021-12-13 ENCOUNTER — Inpatient Hospital Stay
Admission: EM | Admit: 2021-12-13 | Discharge: 2021-12-24 | DRG: 177 | Disposition: A | Discharge status: Skilled Nursing Facility | Payer: Medicare HMO | Attending: Obstetrics and Gynecology | Admitting: Obstetrics and Gynecology

## 2021-12-13 ENCOUNTER — Other Ambulatory Visit: Payer: Self-pay

## 2021-12-13 DIAGNOSIS — U071 COVID-19: Principal | ICD-10-CM | POA: Diagnosis present

## 2021-12-13 DIAGNOSIS — E876 Hypokalemia: Secondary | ICD-10-CM | POA: Diagnosis not present

## 2021-12-13 DIAGNOSIS — I509 Heart failure, unspecified: Secondary | ICD-10-CM | POA: Diagnosis not present

## 2021-12-13 DIAGNOSIS — R0602 Shortness of breath: Secondary | ICD-10-CM | POA: Diagnosis present

## 2021-12-13 DIAGNOSIS — N179 Acute kidney failure, unspecified: Secondary | ICD-10-CM | POA: Diagnosis present

## 2021-12-13 DIAGNOSIS — Z0389 Encounter for observation for other suspected diseases and conditions ruled out: Secondary | ICD-10-CM | POA: Diagnosis not present

## 2021-12-13 DIAGNOSIS — E871 Hypo-osmolality and hyponatremia: Secondary | ICD-10-CM | POA: Diagnosis present

## 2021-12-13 DIAGNOSIS — Z85038 Personal history of other malignant neoplasm of large intestine: Secondary | ICD-10-CM

## 2021-12-13 DIAGNOSIS — Z66 Do not resuscitate: Secondary | ICD-10-CM | POA: Diagnosis present

## 2021-12-13 DIAGNOSIS — Z7401 Bed confinement status: Secondary | ICD-10-CM | POA: Diagnosis not present

## 2021-12-13 DIAGNOSIS — Z825 Family history of asthma and other chronic lower respiratory diseases: Secondary | ICD-10-CM

## 2021-12-13 DIAGNOSIS — R609 Edema, unspecified: Secondary | ICD-10-CM | POA: Diagnosis not present

## 2021-12-13 DIAGNOSIS — Z79899 Other long term (current) drug therapy: Secondary | ICD-10-CM | POA: Diagnosis not present

## 2021-12-13 DIAGNOSIS — K219 Gastro-esophageal reflux disease without esophagitis: Secondary | ICD-10-CM | POA: Diagnosis present

## 2021-12-13 DIAGNOSIS — Z7901 Long term (current) use of anticoagulants: Secondary | ICD-10-CM | POA: Diagnosis not present

## 2021-12-13 DIAGNOSIS — I110 Hypertensive heart disease with heart failure: Secondary | ICD-10-CM | POA: Diagnosis present

## 2021-12-13 DIAGNOSIS — Z83438 Family history of other disorder of lipoprotein metabolism and other lipidemia: Secondary | ICD-10-CM

## 2021-12-13 DIAGNOSIS — R040 Epistaxis: Secondary | ICD-10-CM | POA: Diagnosis not present

## 2021-12-13 DIAGNOSIS — E7800 Pure hypercholesterolemia, unspecified: Secondary | ICD-10-CM | POA: Diagnosis present

## 2021-12-13 DIAGNOSIS — I5033 Acute on chronic diastolic (congestive) heart failure: Secondary | ICD-10-CM | POA: Diagnosis present

## 2021-12-13 DIAGNOSIS — I4891 Unspecified atrial fibrillation: Secondary | ICD-10-CM | POA: Diagnosis present

## 2021-12-13 DIAGNOSIS — E875 Hyperkalemia: Secondary | ICD-10-CM | POA: Diagnosis present

## 2021-12-13 DIAGNOSIS — Z9049 Acquired absence of other specified parts of digestive tract: Secondary | ICD-10-CM

## 2021-12-13 DIAGNOSIS — Z9189 Other specified personal risk factors, not elsewhere classified: Secondary | ICD-10-CM

## 2021-12-13 DIAGNOSIS — R079 Chest pain, unspecified: Secondary | ICD-10-CM | POA: Diagnosis not present

## 2021-12-13 DIAGNOSIS — Z823 Family history of stroke: Secondary | ICD-10-CM

## 2021-12-13 DIAGNOSIS — R0902 Hypoxemia: Secondary | ICD-10-CM | POA: Diagnosis not present

## 2021-12-13 DIAGNOSIS — M79605 Pain in left leg: Secondary | ICD-10-CM | POA: Diagnosis not present

## 2021-12-13 DIAGNOSIS — Z85828 Personal history of other malignant neoplasm of skin: Secondary | ICD-10-CM | POA: Diagnosis not present

## 2021-12-13 DIAGNOSIS — G629 Polyneuropathy, unspecified: Secondary | ICD-10-CM | POA: Diagnosis present

## 2021-12-13 DIAGNOSIS — J9601 Acute respiratory failure with hypoxia: Secondary | ICD-10-CM | POA: Diagnosis present

## 2021-12-13 DIAGNOSIS — T380X5A Adverse effect of glucocorticoids and synthetic analogues, initial encounter: Secondary | ICD-10-CM | POA: Diagnosis not present

## 2021-12-13 DIAGNOSIS — I739 Peripheral vascular disease, unspecified: Secondary | ICD-10-CM | POA: Diagnosis present

## 2021-12-13 DIAGNOSIS — Z9221 Personal history of antineoplastic chemotherapy: Secondary | ICD-10-CM

## 2021-12-13 DIAGNOSIS — Z803 Family history of malignant neoplasm of breast: Secondary | ICD-10-CM

## 2021-12-13 DIAGNOSIS — Z8041 Family history of malignant neoplasm of ovary: Secondary | ICD-10-CM

## 2021-12-13 DIAGNOSIS — R54 Age-related physical debility: Secondary | ICD-10-CM | POA: Diagnosis present

## 2021-12-13 DIAGNOSIS — Z8249 Family history of ischemic heart disease and other diseases of the circulatory system: Secondary | ICD-10-CM

## 2021-12-13 DIAGNOSIS — Z90710 Acquired absence of both cervix and uterus: Secondary | ICD-10-CM

## 2021-12-13 DIAGNOSIS — M79604 Pain in right leg: Secondary | ICD-10-CM | POA: Diagnosis not present

## 2021-12-13 DIAGNOSIS — J1282 Pneumonia due to coronavirus disease 2019: Secondary | ICD-10-CM | POA: Diagnosis present

## 2021-12-13 DIAGNOSIS — R069 Unspecified abnormalities of breathing: Secondary | ICD-10-CM | POA: Diagnosis not present

## 2021-12-13 DIAGNOSIS — K449 Diaphragmatic hernia without obstruction or gangrene: Secondary | ICD-10-CM | POA: Diagnosis not present

## 2021-12-13 DIAGNOSIS — I959 Hypotension, unspecified: Secondary | ICD-10-CM | POA: Diagnosis not present

## 2021-12-13 DIAGNOSIS — Z86711 Personal history of pulmonary embolism: Secondary | ICD-10-CM

## 2021-12-13 DIAGNOSIS — I2699 Other pulmonary embolism without acute cor pulmonale: Secondary | ICD-10-CM | POA: Diagnosis present

## 2021-12-13 DIAGNOSIS — Z833 Family history of diabetes mellitus: Secondary | ICD-10-CM

## 2021-12-13 DIAGNOSIS — G2581 Restless legs syndrome: Secondary | ICD-10-CM | POA: Diagnosis present

## 2021-12-13 DIAGNOSIS — R0689 Other abnormalities of breathing: Secondary | ICD-10-CM | POA: Diagnosis not present

## 2021-12-13 DIAGNOSIS — M7989 Other specified soft tissue disorders: Secondary | ICD-10-CM | POA: Diagnosis not present

## 2021-12-13 DIAGNOSIS — I10 Essential (primary) hypertension: Secondary | ICD-10-CM | POA: Diagnosis present

## 2021-12-13 LAB — CBC
HCT: 32 % — ABNORMAL LOW (ref 36.0–46.0)
Hemoglobin: 10.5 g/dL — ABNORMAL LOW (ref 12.0–15.0)
MCH: 31.2 pg (ref 26.0–34.0)
MCHC: 32.8 g/dL (ref 30.0–36.0)
MCV: 95 fL (ref 80.0–100.0)
Platelets: 301 10*3/uL (ref 150–400)
RBC: 3.37 MIL/uL — ABNORMAL LOW (ref 3.87–5.11)
RDW: 14.5 % (ref 11.5–15.5)
WBC: 8.9 10*3/uL (ref 4.0–10.5)
nRBC: 0 % (ref 0.0–0.2)

## 2021-12-13 LAB — HEPATIC FUNCTION PANEL
ALT: 11 U/L (ref 0–44)
AST: 28 U/L (ref 15–41)
Albumin: 3.3 g/dL — ABNORMAL LOW (ref 3.5–5.0)
Alkaline Phosphatase: 49 U/L (ref 38–126)
Bilirubin, Direct: 0.5 mg/dL — ABNORMAL HIGH (ref 0.0–0.2)
Indirect Bilirubin: 1.3 mg/dL — ABNORMAL HIGH (ref 0.3–0.9)
Total Bilirubin: 1.8 mg/dL — ABNORMAL HIGH (ref 0.3–1.2)
Total Protein: 6.7 g/dL (ref 6.5–8.1)

## 2021-12-13 LAB — BASIC METABOLIC PANEL
Anion gap: 11 (ref 5–15)
BUN: 48 mg/dL — ABNORMAL HIGH (ref 8–23)
CO2: 22 mmol/L (ref 22–32)
Calcium: 8.8 mg/dL — ABNORMAL LOW (ref 8.9–10.3)
Chloride: 91 mmol/L — ABNORMAL LOW (ref 98–111)
Creatinine, Ser: 2.09 mg/dL — ABNORMAL HIGH (ref 0.44–1.00)
GFR, Estimated: 22 mL/min — ABNORMAL LOW (ref >60)
Glucose, Bld: 112 mg/dL — ABNORMAL HIGH (ref 70–99)
Potassium: 5.5 mmol/L — ABNORMAL HIGH (ref 3.5–5.1)
Sodium: 124 mmol/L — ABNORMAL LOW (ref 135–145)

## 2021-12-13 LAB — OSMOLALITY, URINE: Osmolality, Ur: 215 mOsm/kg — ABNORMAL LOW (ref 300–900)

## 2021-12-13 LAB — RESP PANEL BY RT-PCR (FLU A&B, COVID) ARPGX2
Influenza A by PCR: NEGATIVE
Influenza B by PCR: NEGATIVE
SARS Coronavirus 2 by RT PCR: POSITIVE — AB

## 2021-12-13 LAB — PROCALCITONIN: Procalcitonin: 0.1 ng/mL

## 2021-12-13 LAB — D-DIMER, QUANTITATIVE: D-Dimer, Quant: 2.63 ug/mL-FEU — ABNORMAL HIGH (ref 0.00–0.50)

## 2021-12-13 LAB — SODIUM, URINE, RANDOM: Sodium, Ur: 18 mmol/L

## 2021-12-13 LAB — BRAIN NATRIURETIC PEPTIDE: B Natriuretic Peptide: 228.6 pg/mL — ABNORMAL HIGH (ref 0.0–100.0)

## 2021-12-13 LAB — TROPONIN I (HIGH SENSITIVITY): Troponin I (High Sensitivity): 17 ng/L (ref <18)

## 2021-12-13 MED ORDER — IRBESARTAN 150 MG PO TABS
300.0000 mg | ORAL_TABLET | Freq: Every day | ORAL | Status: DC
  Administered 2021-12-14: 300 mg via ORAL
  Filled 2021-12-13: qty 2

## 2021-12-13 MED ORDER — OXYCODONE HCL 5 MG PO TABS: 5.0000 mg | ORAL_TABLET | Freq: Three times a day (TID) | ORAL | Status: DC | PRN

## 2021-12-13 MED ORDER — FUROSEMIDE 10 MG/ML IJ SOLN: 40.0000 mg | Freq: Once | INTRAMUSCULAR | Status: DC

## 2021-12-13 MED ORDER — PREDNISONE 50 MG PO TABS
50.0000 mg | ORAL_TABLET | Freq: Every day | ORAL | Status: DC
  Administered 2021-12-16 – 2021-12-19 (×4): 50 mg via ORAL
  Filled 2021-12-13 (×4): qty 1

## 2021-12-13 MED ORDER — ACETAMINOPHEN 650 MG RE SUPP: 650.0000 mg | Freq: Four times a day (QID) | RECTAL | Status: AC | PRN

## 2021-12-13 MED ORDER — AMLODIPINE BESYLATE 5 MG PO TABS
5.0000 mg | ORAL_TABLET | Freq: Every day | ORAL | Status: DC
  Administered 2021-12-14 – 2021-12-24 (×11): 5 mg via ORAL
  Filled 2021-12-13 (×11): qty 1

## 2021-12-13 MED ORDER — METOPROLOL SUCCINATE ER 25 MG PO TB24
25.0000 mg | ORAL_TABLET | Freq: Two times a day (BID) | ORAL | Status: DC
  Administered 2021-12-14 – 2021-12-24 (×21): 25 mg via ORAL
  Filled 2021-12-13 (×21): qty 1

## 2021-12-13 MED ORDER — TRAMADOL HCL 50 MG PO TABS: 50.0000 mg | ORAL_TABLET | Freq: Every evening | ORAL | Status: DC | PRN

## 2021-12-13 MED ORDER — SENNOSIDES-DOCUSATE SODIUM 8.6-50 MG PO TABS
1.0000 | ORAL_TABLET | Freq: Every evening | ORAL | Status: DC | PRN
  Administered 2021-12-22: 1 via ORAL
  Filled 2021-12-13: qty 1

## 2021-12-13 MED ORDER — METHYLPREDNISOLONE SODIUM SUCC 125 MG IJ SOLR
1.0000 mg/kg | Freq: Two times a day (BID) | INTRAMUSCULAR | Status: AC
  Administered 2021-12-13 – 2021-12-16 (×6): 71.25 mg via INTRAVENOUS
  Filled 2021-12-13 (×6): qty 2

## 2021-12-13 MED ORDER — PANTOPRAZOLE SODIUM 40 MG PO TBEC
40.0000 mg | DELAYED_RELEASE_TABLET | Freq: Two times a day (BID) | ORAL | Status: DC
  Administered 2021-12-13 – 2021-12-24 (×22): 40 mg via ORAL
  Filled 2021-12-13 (×22): qty 1

## 2021-12-13 MED ORDER — METHYLPREDNISOLONE SODIUM SUCC 40 MG IJ SOLR: 40.0000 mg | Freq: Two times a day (BID) | INTRAMUSCULAR | Status: DC

## 2021-12-13 MED ORDER — METOCLOPRAMIDE HCL 5 MG PO TABS
5.0000 mg | ORAL_TABLET | Freq: Two times a day (BID) | ORAL | Status: DC
  Administered 2021-12-13 – 2021-12-24 (×21): 5 mg via ORAL
  Filled 2021-12-13 (×22): qty 1

## 2021-12-13 MED ORDER — HEPARIN SODIUM (PORCINE) 5000 UNIT/ML IJ SOLN
5000.0000 [IU] | Freq: Three times a day (TID) | INTRAMUSCULAR | Status: DC
  Administered 2021-12-13 – 2021-12-14 (×3): 5000 [IU] via SUBCUTANEOUS
  Filled 2021-12-13 (×3): qty 1

## 2021-12-13 MED ORDER — SODIUM CHLORIDE 1 G PO TABS
1.0000 g | ORAL_TABLET | Freq: Once | ORAL | Status: AC
  Administered 2021-12-13: 1 g via ORAL
  Filled 2021-12-13 (×2): qty 1

## 2021-12-13 MED ORDER — ONDANSETRON HCL 4 MG PO TABS: 4.0000 mg | ORAL_TABLET | Freq: Four times a day (QID) | ORAL | Status: AC | PRN

## 2021-12-13 MED ORDER — ALBUTEROL SULFATE HFA 108 (90 BASE) MCG/ACT IN AERS: 2.0000 | INHALATION_SPRAY | Freq: Four times a day (QID) | RESPIRATORY_TRACT | Status: DC | PRN

## 2021-12-13 MED ORDER — HYDROCOD POLI-CHLORPHE POLI ER 10-8 MG/5ML PO SUER: 5.0000 mL | Freq: Two times a day (BID) | ORAL | Status: DC | PRN

## 2021-12-13 MED ORDER — GABAPENTIN 100 MG PO CAPS
200.0000 mg | ORAL_CAPSULE | Freq: Every day | ORAL | Status: DC
  Administered 2021-12-13 – 2021-12-23 (×11): 200 mg via ORAL
  Filled 2021-12-13 (×11): qty 2

## 2021-12-13 MED ORDER — ACETAMINOPHEN 325 MG PO TABS: 650.0000 mg | ORAL_TABLET | Freq: Four times a day (QID) | ORAL | Status: AC | PRN

## 2021-12-13 MED ORDER — ONDANSETRON HCL 4 MG/2ML IJ SOLN: 4.0000 mg | Freq: Four times a day (QID) | INTRAMUSCULAR | Status: AC | PRN

## 2021-12-13 MED ORDER — GUAIFENESIN-DM 100-10 MG/5ML PO SYRP: 10.0000 mL | ORAL_SOLUTION | ORAL | Status: DC | PRN

## 2021-12-13 MED ORDER — SIMVASTATIN 20 MG PO TABS
10.0000 mg | ORAL_TABLET | Freq: Every day | ORAL | Status: DC
  Administered 2021-12-14 – 2021-12-23 (×10): 10 mg via ORAL
  Filled 2021-12-13 (×10): qty 1

## 2021-12-13 NOTE — ED Triage Notes (Signed)
First nurse note: Arrived by EMS from home for SOB. Recently admitted to hospital for CHF exacerbation. C/o pitting edema in bilateral extremities. EMS reports mild wheezing.  EMS vitals: 87% RA per EMS, placed on 1L O2 with sats of 98% 150/70b/p

## 2021-12-13 NOTE — Assessment & Plan Note (Signed)
-  Start her on molnupiravir. - Solu-Medrol IV with tapering to p.o. prednisone - Symptomatic support: Robitussin-DM and Tussionex as needed for cough ordered - Airborne and contact precautions -Supplemental oxygen-wean as tolerated

## 2021-12-13 NOTE — H&P (Addendum)
History and Physical   [name] IZT:[id number] DOB: 08-Aug-1928 DOA: 12/13/2021  PCP: [name], MD  Outpatient Specialists: Dr. Leanna Sato clinic cardiology Patient coming from: Home via EMS  I have personally briefly reviewed patient's old medical records in [place].  Chief Concern: Shortness of breath  HPI: [name] is a 88 year old female with history of hypertension, neuropathy, hyperlipidemia, back spasms, osteoarthritis, who presents to the emergency department for chief concerns of shortness of breath.  Initial vitals in the emergency department showed temperature of 97.8, respiration rate of 20, heart rate of 63, blood pressure 132/55, SPO2 of 86% on room air and improved to 100% on 3 L nasal cannula.  Serum sodium is 124, potassium 5.5, chloride 91, bicarb 22, BUN of 48, serum creatinine of 2.09, GFR 22, nonfasting blood glucose 112, WBC 8.9, hemoglobin 10.5, platelets of 301.  Presently troponin is 17.  BNP is 228.6.  D-dimer elevated at 2.63.  COVID/influenza A/influenza B PCR pending.  EDP ordered a VQ scan that is pending and bilateral ultrasound of the lower extremities to assess for DVT.  ED treatment: Furosemide 40 mg IV one-time dose, sodium chloride 1 g tablet.  At bedside patient was able to tell me her name, her age, she knows she is in the hospital and she was able to identify her daughter Ezabella Teska at bedside and her son-in-law at bedside.  She does not appear to be in acute distress.  Her daughter reports that they have been compliant with furosemide at home.  She was told however that on day of discharge patient did not receive her furosemide/Lasix medication prior to discharge home. She reports that patient has always had shortness of breath and really did not improve even on discharge.  She reports initially the shortness of breath was worse with exertion and patient has been mostly in bed and not exerting herself.  Daughter did  notice that on day of discharge once patient got home, patient went to the restroom to pee and was struggling due to the exertion.  Silvanna Ohmer states that today she noticed that her mother was struggling even at rest and so she checked pulse ox today and it was 84% on RA, and EMS was called.   Family denies fever, nausea, vomiting.   Family reports a good bowel movement on Sunday.  Social history: She lives with her husband. She denies tobacco, etoh drug use. She was a homemaker.  ROS: Constitutional: no weight change, no fever ENT/Mouth: no sore throat, no rhinorrhea Eyes: no eye pain, no vision changes Cardiovascular: no chest pain, + dyspnea,  no edema, no palpitations Respiratory: no cough, no sputum, no wheezing Gastrointestinal: no nausea, no vomiting, no diarrhea, no constipation Genitourinary: no urinary incontinence, no dysuria, no hematuria Musculoskeletal: no arthralgias, no myalgias Skin: no skin lesions, no pruritus, Neuro: + weakness, no loss of consciousness, no syncope Psych: no anxiety, no depression, + decrease appetite Heme/Lymph: no bruising, no bleeding  ED Course: Discussed with emergency medicine provider, patient requiring hospitalization for chief concerns of acute hypoxic respiratory failure.  Assessment/Plan  Principal Problem:   Acute hypoxemic respiratory failure due to COVID-19 [facility]) Active Problems:   AKI (acute kidney injury) ([place])   Hypercholesteremia   PAD (peripheral artery disease) (HCC)   Restless legs syndrome   Shortness of breath   Pneumonia due to COVID-19 virus   Hyperkalemia   Hyponatremia   At risk for polypharmacy   Assessment and Plan:  * Acute  hypoxemic respiratory failure due to COVID-19 (HCC) - Solu-Medrol IV with tapering to p.o. prednisone - Symptomatic support: Robitussin-DM and Tussionex as needed for cough ordered - Airborne and contact precautions  AKI (acute kidney injury) ([place]) - Baseline serum creatinine is  1.12-1.22/GFR 42-47 - Etiology is multifactorial including cardiorenal versus intrarenal in setting of COVID-19 infection  At risk for polypharmacy - Resumed home tramadol 50 mg nightly.  For moderate pain - Ordered oxycodone 5 mg p.o. 3 times daily as needed for severe pain - Patient takes gabapentin 200 mg nightly  Hyponatremia - With COVID-19 infection and elevated BNP, avoiding IV fluid at this time - Sodium chloride 1 g p.o. ordered by EDP - Check random urine level, urine osmolality - Recheck serum sodium level at scheduled time, 12/04/2021 at 1 AM - If patient's serum sodium level has improved to 126-128, we will order furosemide 40 mg IV one-time dose, discussed with cross coverage provider - BMP in a.m.  Hyperkalemia - Presumed secondary to acute kidney injury  Pneumonia due to COVID-19 virus - Solu-Medrol - Oxygen supplementation  Shortness of breath - Multifactorial including heart failure exacerbation in setting of COVID-19 infection  Restless legs syndrome - Gabapentin 200 mg nightly  Hypercholesteremia - Simvastatin 10 mg daily resumed  Chart reviewed.   DVT prophylaxis: Heparin 5000 units subcutaneous Code Status: DNR Diet: Heart healthy Family Communication: Discussed and updated with daughter, Sumeet Geter at bedside Disposition Plan: Pending clinical course Consults called: None at this time Admission status: Telemetry cardiac, observation  Past Medical History:  Diagnosis Date   Anemia    Barrett's esophagus    BCC (basal cell carcinoma of skin) 03/07/2013   vertex scalp - NODULAR NODULAR PATTERN PATTERN   Bowel obstruction (HCC)    s/p adhesion resection   CHF (congestive heart failure) (HCC)    Chronic back pain    Colon cancer ([place]) 1988 and 1989   adenocarcinoma, s/p resection x  2 and chemo   Diverticulitis    GERD (gastroesophageal reflux disease)    H/O open leg wound    Hiatal hernia    Hypercholesteremia    Hypertension    Lumbar  scoliosis    OA (osteoarthritis)    Peripheral neuropathy    Recurrent sinus infections    Renal cyst    S/P chemotherapy, time since greater than 12 weeks    colon cancer   Vertigo    Past Surgical History:  Procedure Laterality Date   ABDOMINAL HYSTERECTOMY  1982   adhesions resected     bowel obstruction   APPENDECTOMY     BREAST BIOPSY Left    negative 06/14/1985   CHOLECYSTECTOMY  1994   COLON RESECTION     x2.  s/p colon cancer   COLON SURGERY  1958 and 1989   For Colon Cancer   EXCISIONAL HEMORRHOIDECTOMY     with tubal ligation   EXPLORATORY LAPAROTOMY  1991   Secondary to SBO   Social History:  reports that she has never smoked. She has never used smokeless tobacco. She reports that she does not drink alcohol and does not use drugs.  Allergies  Allergen Reactions   Fentanyl Other (See Comments)    confusion   Family History  Problem Relation Age of Onset   Breast cancer Mother    Asthma Mother    Stroke Father    Hypertension Father    Diabetes Father    Ovarian cancer Sister  x2   Prostate cancer Brother    Hypertension Brother        x3   Heart disease Brother    Hypercholesterolemia Brother        x3   Diabetes Brother    Spina bifida Grandchild    Hematuria Neg Hx    Kidney cancer Neg Hx    Kidney disease Neg Hx    Sickle cell trait Neg Hx    Tuberculosis Neg Hx    Family history: Family history reviewed and not pertinent  Prior to Admission medications   Medication Sig Start Date End Date Taking? Authorizing Provider  albuterol (VENTOLIN HFA) 108 (90 Base) MCG/ACT inhaler TAKE 2 PUFFS BY MOUTH EVERY 6 HOURS AS NEEDED FOR WHEEZE OR SHORTNESS OF BREATH 11/12/21   [name], MD  amLODipine (NORVASC) 5 MG tablet TAKE 1 TABLET EVERY DAY 11/29/21   [name], MD  calcium carbonate (OSCAL) 1500 (600 Ca) MG TABS tablet Take 600 mg of elemental calcium by mouth 2 (two) times daily with a meal.    [provider]   Cholecalciferol (VITAMIN D3) 50 MCG (2000 UT) capsule Take 2,000 Units by mouth daily.    [provider]  diclofenac Sodium (VOLTAREN) 1 % GEL Apply 2 g topically 3 (three) times daily. 11/20/21   Fritzi Mandes, MD  docusate sodium (COLACE) 100 MG capsule Take 100 mg by mouth 2 (two) times daily.    [provider]  fexofenadine (ALLEGRA) 60 MG tablet TAKE 1 TABLET EVERY DAY 08/29/21   Dutch Quint B, FNP  furosemide (LASIX) 20 MG tablet Take 1 tablet (20 mg total) by mouth daily. 12/11/21 01/10/22  Max Sane, MD  gabapentin (NEURONTIN) 100 MG capsule TAKE 2 CAPSULES AT BEDTIME 05/18/20   [name], MD  irbesartan (AVAPRO) 300 MG tablet TAKE 1 TABLET EVERY DAY 11/22/21   [name], MD  metoCLOPramide (REGLAN) 5 MG tablet Take 5 mg by mouth 2 (two) times daily. 11/29/21   [provider]  metoprolol succinate (TOPROL-XL) 25 MG 24 hr tablet TAKE 1 TABLET TWICE DAILY 04/27/21   [name], MD  ondansetron (ZOFRAN ODT) 4 MG disintegrating tablet Take 1 tablet (4 mg total) by mouth 2 (two) times daily as needed for nausea or vomiting. 10/29/19   [name], MD  oxyCODONE (OXY IR/ROXICODONE) 5 MG immediate release tablet Take 5 mg by mouth 3 (three) times daily.     [provider]  pantoprazole (PROTONIX) 40 MG tablet Take 40 mg by mouth 2 (two) times daily. 11/29/21   [provider]  simvastatin (ZOCOR) 10 MG tablet TAKE 1 TABLET (10 MG TOTAL) AT BEDTIME. 02/23/21   [name], MD  Spacer/Aero-Holding Josiah Lobo ([place]) [place]  06/26/19   [provider]  traMADol (ULTRAM) 50 MG tablet Take 1 tablet by mouth at bedtime as needed. 07/07/20   [provider]   Physical Exam: Vitals:   12/13/21 2102 12/13/21 2200 12/13/21 2230 12/13/21 2323  BP: (!) 152/65 (!) 116/40 (!) 127/46   Pulse: 68 61 64   Resp: 15 19 (!) 26   Temp: 98.3 F (36.8 C)   (!) 97.5 F (36.4 C)  TempSrc: Oral   Oral  SpO2: 100% 100%  100%   Weight:       Constitutional: appears age-appropriate, frail, NAD, calm, comfortable Eyes: PERRL, lids and conjunctivae normal ENMT: Mucous membranes are moist. Posterior pharynx clear of any exudate or lesions. Age-appropriate dentition. Hearing appropriate Neck:  normal, supple, no masses, no thyromegaly Respiratory: clear to auscultation bilaterally, no wheezing, no crackles. Normal respiratory effort. No accessory muscle use.  Cardiovascular: Regular rate and rhythm, no murmurs / rubs / gallops. No extremity edema. 2+ pedal pulses. No carotid bruits.  Abdomen: no tenderness, no masses palpated, no hepatosplenomegaly. Bowel sounds positive.  Musculoskeletal: no clubbing / cyanosis. No joint deformity upper and lower extremities. Good ROM, no contractures, no atrophy. Normal muscle tone.  Skin: no rashes, lesions, ulcers. No induration Neurologic: Sensation intact. Strength 5/5 in all 4.  Psychiatric: Normal judgment and insight. Alert and oriented x 3. Normal mood.   EKG: independently reviewed, showing atrial fibrillation with rate of 66, QTc 398  Chest x-ray on Admission: I personally reviewed and I agree with radiologist reading as below.  US Venous Img Lower Bilateral  Result Date: 12/13/2021 CLINICAL DATA:  Pain with swelling EXAM: BILATERAL LOWER EXTREMITY VENOUS DOPPLER ULTRASOUND TECHNIQUE: Gray-scale sonography with graded compression, as well as color Doppler and duplex ultrasound were performed to evaluate the lower extremity deep venous systems from the level of the common femoral vein and including the common femoral, femoral, profunda femoral, popliteal and calf veins including the posterior tibial, peroneal and gastrocnemius veins when visible. The superficial great saphenous vein was also interrogated. Spectral Doppler was utilized to evaluate flow at rest and with distal augmentation maneuvers in the common femoral, femoral and popliteal veins. COMPARISON:  Left lower  extremity venous Doppler ultrasound 02/20/2020 FINDINGS: RIGHT LOWER EXTREMITY Common Femoral Vein: No evidence of thrombus. Normal compressibility, respiratory phasicity and response to augmentation. Saphenofemoral Junction: No evidence of thrombus. Normal compressibility and flow on color Doppler imaging. Profunda Femoral Vein: No evidence of thrombus. Normal compressibility and flow on color Doppler imaging. Femoral Vein: No evidence of thrombus. Normal compressibility, respiratory phasicity and response to augmentation. Popliteal Vein: No evidence of thrombus. Normal compressibility, respiratory phasicity and response to augmentation. Calf Veins: No evidence of thrombus. Normal compressibility and flow on color Doppler imaging. Superficial Great Saphenous Vein: No evidence of thrombus. Normal compressibility. Venous Reflux:  None. Other Findings:  Lower extremity soft tissue edema present. LEFT LOWER EXTREMITY Common Femoral Vein: No evidence of thrombus. Normal compressibility, respiratory phasicity and response to augmentation. Saphenofemoral Junction: No evidence of thrombus. Normal compressibility and flow on color Doppler imaging. Profunda Femoral Vein: No evidence of thrombus. Normal compressibility and flow on color Doppler imaging. Femoral Vein: No evidence of thrombus. Normal compressibility, respiratory phasicity and response to augmentation. Popliteal Vein: No evidence of thrombus. Normal compressibility, respiratory phasicity and response to augmentation. Calf Veins: No evidence of thrombus. Normal compressibility and flow on color Doppler imaging. Superficial Great Saphenous Vein: No evidence of thrombus. Normal compressibility. Venous Reflux:  None. Other Findings:  Lower extremity soft tissue edema present. IMPRESSION: No evidence of deep venous thrombosis in either lower extremity. Electronically Signed   By: Ronney Asters M.D.   On: 12/13/2021 20:57   DG Chest 2 View  Result Date:  12/13/2021 CLINICAL DATA:  Shortness of breath EXAM: CHEST - 2 VIEW COMPARISON:  Previous studies including the examination of 12/09/2021 FINDINGS: Transverse diameter of heart is increased. There are no signs of alveolar pulmonary edema or focal pulmonary consolidation. Linear densities in left parahilar region suggest scarring or subsegmental atelectasis. There is blunting of left lateral CP angle. There is no pneumothorax. There is moderate to large fixed hiatal hernia. IMPRESSION: Cardiomegaly. There are no signs of pulmonary edema or focal pulmonary consolidation. There are small  linear densities in left parahilar region suggesting scarring or subsegmental atelectasis. Blunting of left lateral CP angle may be due to minimal effusion or pleural thickening. Moderate to large fixed hiatal hernia. Electronically Signed   By: Elmer Picker M.D.   On: 12/13/2021 14:09    Labs on Admission: I have personally reviewed following labs  CBC: Recent Labs  Lab 12/09/21 1013 12/11/21 0556 12/13/21 1410  WBC 8.6 8.2 8.9  HGB 9.1* 9.3* 10.5*  HCT 29.1* 29.0* 32.0*  MCV 100.3* 99.3 95.0  PLT 228 276 993   Basic Metabolic Panel: Recent Labs  Lab 12/09/21 1013 12/10/21 0400 12/11/21 0556 12/13/21 1327  NA 139 138 137 124*  K 3.7 4.0 4.2 5.5*  CL 106 105 102 91*  CO2 '23 23 26 22  '$ GLUCOSE 122* 107* 122* 112*  BUN 34* 30* 41* 48*  CREATININE 1.22* 1.18* 1.52* 2.09*  CALCIUM 8.8* 8.7* 8.8* 8.8*   GFR: Estimated Creatinine Clearance: 16.9 mL/min (A) (by C-G formula based on SCr of 2.09 mg/dL (H)).  Liver Function Tests: Recent Labs  Lab 12/09/21 1013 12/13/21 1851  AST 14* 28  ALT 10 11  ALKPHOS 46 49  BILITOT 1.2 1.8*  PROT 6.3* 6.7  ALBUMIN 3.1* 3.3*   Urine analysis:    Component Value Date/Time   COLORURINE STRAW (A) 11/16/2021 2035   APPEARANCEUR CLEAR (A) 11/16/2021 2035   LABSPEC 1.008 11/16/2021 2035   PHURINE 5.0 11/16/2021 2035   GLUCOSEU NEGATIVE 11/16/2021 2035    GLUCOSEU NEGATIVE 04/04/2018 1255   HGBUR NEGATIVE 11/16/2021 2035   BILIRUBINUR NEGATIVE 11/16/2021 2035   KETONESUR NEGATIVE 11/16/2021 2035   PROTEINUR NEGATIVE 11/16/2021 2035   UROBILINOGEN 0.2 04/04/2018 1255   NITRITE NEGATIVE 11/16/2021 2035   LEUKOCYTESUR MODERATE (A) 11/16/2021 2035   Dr. Tobie Poet [facility]  If 7PM-7AM, please contact overnight-coverage provider If 7AM-7PM, please contact day coverage provider [email or web address]  12/13/2021, 11:47 PM

## 2021-12-13 NOTE — ED Notes (Signed)
Received report, assumed care

## 2021-12-13 NOTE — ED Provider Triage Note (Signed)
Emergency Medicine Provider Triage Evaluation Note  [name] , a 88 y.o. female  was evaluated in triage.  Pt complains of shortness of breath. Recent admission for CHF. Not on home oxygen. Oxygen saturation in the 80s on room air per EMS. Patient denies chest pain.  Physical Exam  BP (!) 132/55 (BP Location: Left Arm)   Pulse 63   Temp 97.8 F (36.6 C)   Resp 20   Wt 71.2 kg   SpO2 (!) 86%   BMI 27.81 kg/m  Gen:   Awake, no distress   Resp:  Normal effort  MSK:   Moves extremities without difficulty  Other:    Medical Decision Making  Medically screening exam initiated at 1:12 PM.  Appropriate orders placed.  [facility] was informed that the remainder of the evaluation will be completed by another provider, this initial triage assessment does not replace that evaluation, and the importance of remaining in the ED until their evaluation is complete.  86% on room air. Placed on 3 liters with increase to 90%.   Victorino Dike, FNP 12/13/21 1314

## 2021-12-13 NOTE — Assessment & Plan Note (Addendum)
-   With COVID-19 infection and elevated BNP, avoiding IV fluid at this time.  Low urine osmolality and urinary sodium of 18. - Sodium chloride 1 g p.o. ordered by EDP -Monitor sodium

## 2021-12-13 NOTE — Assessment & Plan Note (Signed)
-   Simvastatin 10 mg daily resumed

## 2021-12-13 NOTE — ED Provider Notes (Signed)
[facility] Provider Note    Event Date/Time   First MD Initiated Contact with Patient 12/13/21 1623     (approximate)   History   Chief Complaint Shortness of Breath   HPI  THETA LEAF is a 88 y.o. female with past medical history of hypertension, atrial fibrillation, PAD, and diastolic CHF who presents to the ED complaining of shortness of breath.  Family reports that patient was recently admitted to the hospital for CHF exacerbation and went home 2 days ago.  Since then, she has been compliant with her usual Lasix regimen but has developed increased swelling in her legs as well as increasing difficulty breathing.  Family reports that she has minimal difficulty breathing at rest but gets very short of breath with even attempting to lift herself up out of bed.  She has previously gone around with a walker but is unable to ambulate at this time.  Patient denies any pain in her chest and has not had any fevers, does report a dry cough.  Family does report that patient's urine output has seemed to go down recently.  She was recently taken off of a blood thinner due to GI bleeding.     Physical Exam   Triage Vital Signs: ED Triage Vitals  Enc Vitals Group     BP 12/13/21 1308 (!) 132/55     Pulse Rate 12/13/21 1308 63     Resp 12/13/21 1308 20     Temp 12/13/21 1308 97.8 F (36.6 C)     Temp Source 12/13/21 1600 Oral     SpO2 12/13/21 1308 (!) 86 %     Weight 12/13/21 1309 157 lb (71.2 kg)     Height --      Head Circumference --      Peak Flow --      Pain Score --      Pain Loc --      Pain Edu? --      Excl. in [place]? --     Most recent vital signs: Vitals:   12/13/21 1626 12/13/21 1900  BP: (!) 89/67 (!) 147/51  Pulse: 68 (!) 49  Resp: 18 (!) 25  Temp:    SpO2: 99% 100%    Constitutional: Awake and alert. Eyes: Conjunctivae are normal. Head: Atraumatic. Nose: No congestion/rhinnorhea. Mouth/Throat: Mucous membranes are moist.   Cardiovascular: Normal rate, irregularly irregular rhythm. Grossly normal heart sounds.  2+ radial pulses bilaterally. Respiratory: Normal respiratory effort.  No retractions. Lungs with crackles to bilateral bases. Gastrointestinal: Soft and nontender. No distention. Musculoskeletal: No lower extremity tenderness, 3+ pitting edema to knees bilaterally. Neurologic:  Normal speech and language. No gross focal neurologic deficits are appreciated.    ED Results / Procedures / Treatments   Labs (all labs ordered are listed, but only abnormal results are displayed) Labs Reviewed  BASIC METABOLIC PANEL - Abnormal; Notable for the following components:      Result Value   Sodium 124 (*)    Potassium 5.5 (*)    Chloride 91 (*)    Glucose, Bld 112 (*)    BUN 48 (*)    Creatinine, Ser 2.09 (*)    Calcium 8.8 (*)    GFR, Estimated 22 (*)    All other components within normal limits  BRAIN NATRIURETIC PEPTIDE - Abnormal; Notable for the following components:   B Natriuretic Peptide 228.6 (*)    All other components within normal limits  CBC -  Abnormal; Notable for the following components:   RBC 3.37 (*)    Hemoglobin 10.5 (*)    HCT 32.0 (*)    All other components within normal limits  HEPATIC FUNCTION PANEL - Abnormal; Notable for the following components:   Albumin 3.3 (*)    Total Bilirubin 1.8 (*)    Bilirubin, Direct 0.5 (*)    Indirect Bilirubin 1.3 (*)    All other components within normal limits  D-DIMER, QUANTITATIVE (NOT AT [facility]) - Abnormal; Notable for the following components:   D-Dimer, Quant 2.63 (*)    All other components within normal limits  RESP PANEL BY RT-PCR (FLU A&B, COVID) ARPGX2  PROCALCITONIN  BASIC METABOLIC PANEL  CBC  TROPONIN I (HIGH SENSITIVITY)     EKG  ED ECG REPORT I, Blake Divine, the attending physician, personally viewed and interpreted this ECG.   Date: 12/13/2021  EKG Time: 13:20  Rate: 66  Rhythm: atrial fibrillation  Axis:  Normal  Intervals:none  ST&T Change: None  RADIOLOGY Chest x-ray reviewed and interpreted by me with cardiomegaly but no edema or focal infiltrate, possible small effusion noted.  PROCEDURES:  Critical Care performed: Yes, see critical care procedure note(s)  .Critical Care  Performed by: Blake Divine, MD Authorized by: Blake Divine, MD   Critical care provider statement:    Critical care time (minutes):  30   Critical care time was exclusive of:  Separately billable procedures and treating other patients and teaching time   Critical care was necessary to treat or prevent imminent or life-threatening deterioration of the following conditions:  Respiratory failure and metabolic crisis   Critical care was time spent personally by me on the following activities:  Development of treatment plan with patient or surrogate, discussions with consultants, evaluation of patient's response to treatment, examination of patient, ordering and review of laboratory studies, ordering and review of radiographic studies, ordering and performing treatments and interventions, pulse oximetry, re-evaluation of patient's condition and review of old charts   I assumed direction of critical care for this patient from another provider in my specialty: no     Care discussed with: admitting provider      MEDICATIONS ORDERED IN ED: Medications  furosemide (LASIX) injection 40 mg (has no administration in time range)  sodium chloride tablet 1 g (has no administration in time range)  acetaminophen (TYLENOL) tablet 650 mg (has no administration in time range)    Or  acetaminophen (TYLENOL) suppository 650 mg (has no administration in time range)  ondansetron (ZOFRAN) tablet 4 mg (has no administration in time range)    Or  ondansetron (ZOFRAN) injection 4 mg (has no administration in time range)  heparin injection 5,000 Units (has no administration in time range)  senna-docusate (Senokot-S) tablet 1 tablet  (has no administration in time range)     IMPRESSION / MDM / [place] / ED COURSE  I reviewed the triage vital signs and the nursing notes.                              88 y.o. female with past medical history of hypertension, atrial fibrillation, diastolic CHF, and PAD who presents to the ED complaining of increasing difficulty breathing, leg swelling, and decreased urine output over the past 2 days following admission for CHF exacerbation.  Patient's presentation is most consistent with acute presentation with potential threat to life or bodily function.  Differential diagnosis includes,  but is not limited to, ACS, PE, pneumonia, pleural effusion, CHF exacerbation, COPD exacerbation, AKI, electrolyte abnormality.  Patient chronically ill-appearing but in no acute distress, found to be hypoxic in triage to 87% on room air, placed on 3 L nasal cannula with improvement.  She is in atrial fibrillation but rate is controlled and EKG shows no ischemic changes.  She does appear clinically fluid overloaded with significant lower extremity edema, possible small effusion noted on chest x-ray but no focal infiltrate or edema noted.  Patient does have an AKI with recent decreased urine output, additionally noted to be significantly hyponatremic, may be related to recent diuresis.  Anemia stable compared to previous with no significant leukocytosis.  Given hypoxic respiratory failure with no overt edema on chest x-ray, will need to rule out PE given her recent anticoagulation was stopped.  We will check D-dimer, also add on troponin and procalcitonin, although suspicion for infectious process is lower.  D-dimer is elevated and we will further assess with VQ scan given patient's AKI, also check lower extremity ultrasound for DVT.  If she is DVT positive, would plan to initiate IV heparin, which may be turned off in the event patient develops recurrent GI bleeding.  Troponin within normal limits and  case discussed with hospitalist for admission.  In further discussion with the hospitalist, we will gently diurese with 40 mg of IV Lasix, also give oral sodium chloride to counteract any worsening hyponatremia.      FINAL CLINICAL IMPRESSION(S) / ED DIAGNOSES   Final diagnoses:  Acute respiratory failure with hypoxia (HCC)  Acute on chronic diastolic congestive heart failure (HCC)  Hyponatremia  AKI (acute kidney injury) ([place])     Rx / DC Orders   ED Discharge Orders     None        Note:  This document was prepared using Dragon voice recognition software and may include unintentional dictation errors.   Blake Divine, MD 12/13/21 2009

## 2021-12-13 NOTE — Assessment & Plan Note (Signed)
-   Multifactorial including heart failure exacerbation in setting of COVID-19 infection, COVID-19 pneumonia and PE.

## 2021-12-13 NOTE — Assessment & Plan Note (Addendum)
Procalcitonin negative -Molnupiravir - Solu-Medrol - Oxygen supplementation

## 2021-12-13 NOTE — Assessment & Plan Note (Signed)
Some improvement of creatinine. - Baseline serum creatinine is 1.12-1.22/GFR 42-47 - Etiology is multifactorial including cardiorenal versus intrarenal in setting of COVID-19 infection -Monitor renal function -Avoid nephrotoxins

## 2021-12-13 NOTE — Hospital Course (Addendum)
[name] is a 88 year old female with history of hypertension, neuropathy, hyperlipidemia, back spasms, osteoarthritis, who presents to the emergency department for chief concerns of shortness of breath.  Initial vitals in the emergency department showed temperature of 97.8, respiration rate of 20, heart rate of 63, blood pressure 132/55, SPO2 of 86% on room air and improved to 100% on 3 L nasal cannula.  Serum sodium is 124, potassium 5.5, chloride 91, bicarb 22, BUN of 48, serum creatinine of 2.09, GFR 22, nonfasting blood glucose 112, WBC 8.9, hemoglobin 10.5, platelets of 301.  Presently troponin is 17.  BNP is 228.6.  D-dimer elevated at 2.63.  COVID/influenza A/influenza B PCR positive for COVID.  EDP ordered a VQ scan that is pending and bilateral ultrasound of the lower extremities to assess for DVT.  ED treatment: Furosemide 40 mg IV one-time dose, sodium chloride 1 g tablet.  10/31: Sodium improved to 130.  Creatinine at 1.64.  Lower extremity venous Doppler was negative for DVT but VQ scan with high probability of PE. Patient was started on molnupiravir and Xarelto. History of GI bleed on Eliquis, husband was reluctant to restart Eliquis.  Agrees to give Xarelto a trial.  Patient is high risk for deterioration and mortality based on advanced age, underlying comorbidities and recent PE with COVID-19 pneumonia. Palliative care consult.  11/1: Patient was started on Xarelto yesterday.  Per family she appears little more lethargic and confused today, they were concerned that it is secondary to Xarelto, although less likely. Patient was able to answer questions appropriately with me.  Appears lethargic.  Multiple underlying comorbidities which are contributory at this stage.  Awaiting palliative care consult.  11/2: Hemodynamically stable.  No current concern of abnormal bleeding.  Remained on 2 L of oxygen.  Daughter was also interested for SNF, PT/OT ordered.  Mentation at  baseline. Palliative care consult pending.  11/3: Remained hemodynamically stable.  PT is recommending SNF.  TOC is aware. Palliative care is recommending outpatient palliative follow-up.  11/4: Patient remained stable, saturating well on room air now.  Awaiting disposition to SNF.  11/5: Remains stable.  Have to complete 10 days of quarantine for SNF, should be able to go on Friday, 12/24/2021  11/6: Patient with some leukocytosis and hypokalemia with potassium of 3.3.  Procalcitonin remain negative, most likely secondary to steroid use.  Magnesium at 2.2. Having some nose bleed-compression and Afrin helped.  11/7: Remains stable on room air.  Improving leukocytosis, slight increase in creatinine.  Encouraging p.o. hydration..  Hemoglobin remained stable. Going to SNF on Friday after completing 10 days of quarantine

## 2021-12-13 NOTE — Assessment & Plan Note (Signed)
Resolved. - Presumed secondary to acute kidney injury

## 2021-12-13 NOTE — Assessment & Plan Note (Signed)
-   Resumed home tramadol 50 mg nightly.  For moderate pain - Ordered oxycodone 5 mg p.o. 3 times daily as needed for severe pain - Patient takes gabapentin 200 mg nightly

## 2021-12-13 NOTE — ED Triage Notes (Signed)
Pt bib ems c/o SOB. Pt alert, oriented to name and place, unable to say if she uses oxygen at home. Denies chest pain, no cough. Bilateral leg edema noted, wheezing.   86% RA--> 87% 2LNC

## 2021-12-13 NOTE — Assessment & Plan Note (Signed)
-   Gabapentin 200 mg nightly

## 2021-12-13 NOTE — ED Triage Notes (Signed)
SPO2 91% on 3L[place]

## 2021-12-14 ENCOUNTER — Other Ambulatory Visit: Payer: Self-pay

## 2021-12-14 ENCOUNTER — Observation Stay: Payer: Medicare HMO

## 2021-12-14 DIAGNOSIS — E871 Hypo-osmolality and hyponatremia: Secondary | ICD-10-CM | POA: Diagnosis present

## 2021-12-14 DIAGNOSIS — N179 Acute kidney failure, unspecified: Secondary | ICD-10-CM | POA: Diagnosis present

## 2021-12-14 DIAGNOSIS — I110 Hypertensive heart disease with heart failure: Secondary | ICD-10-CM | POA: Diagnosis present

## 2021-12-14 DIAGNOSIS — Z7901 Long term (current) use of anticoagulants: Secondary | ICD-10-CM | POA: Diagnosis not present

## 2021-12-14 DIAGNOSIS — I5033 Acute on chronic diastolic (congestive) heart failure: Secondary | ICD-10-CM | POA: Diagnosis present

## 2021-12-14 DIAGNOSIS — K219 Gastro-esophageal reflux disease without esophagitis: Secondary | ICD-10-CM | POA: Diagnosis present

## 2021-12-14 DIAGNOSIS — I4891 Unspecified atrial fibrillation: Secondary | ICD-10-CM | POA: Diagnosis present

## 2021-12-14 DIAGNOSIS — E875 Hyperkalemia: Secondary | ICD-10-CM | POA: Diagnosis present

## 2021-12-14 DIAGNOSIS — Z66 Do not resuscitate: Secondary | ICD-10-CM | POA: Diagnosis present

## 2021-12-14 DIAGNOSIS — I2699 Other pulmonary embolism without acute cor pulmonale: Secondary | ICD-10-CM | POA: Diagnosis present

## 2021-12-14 DIAGNOSIS — J9601 Acute respiratory failure with hypoxia: Secondary | ICD-10-CM | POA: Diagnosis present

## 2021-12-14 DIAGNOSIS — G2581 Restless legs syndrome: Secondary | ICD-10-CM | POA: Diagnosis present

## 2021-12-14 DIAGNOSIS — Z90710 Acquired absence of both cervix and uterus: Secondary | ICD-10-CM | POA: Diagnosis not present

## 2021-12-14 DIAGNOSIS — R0602 Shortness of breath: Secondary | ICD-10-CM | POA: Insufficient documentation

## 2021-12-14 DIAGNOSIS — Z9221 Personal history of antineoplastic chemotherapy: Secondary | ICD-10-CM | POA: Diagnosis not present

## 2021-12-14 DIAGNOSIS — U071 COVID-19: Secondary | ICD-10-CM | POA: Diagnosis present

## 2021-12-14 DIAGNOSIS — Z0389 Encounter for observation for other suspected diseases and conditions ruled out: Secondary | ICD-10-CM | POA: Diagnosis not present

## 2021-12-14 DIAGNOSIS — I739 Peripheral vascular disease, unspecified: Secondary | ICD-10-CM | POA: Diagnosis present

## 2021-12-14 DIAGNOSIS — J1282 Pneumonia due to coronavirus disease 2019: Secondary | ICD-10-CM | POA: Diagnosis present

## 2021-12-14 DIAGNOSIS — Z79899 Other long term (current) drug therapy: Secondary | ICD-10-CM | POA: Diagnosis not present

## 2021-12-14 DIAGNOSIS — E876 Hypokalemia: Secondary | ICD-10-CM | POA: Diagnosis not present

## 2021-12-14 DIAGNOSIS — E7800 Pure hypercholesterolemia, unspecified: Secondary | ICD-10-CM | POA: Diagnosis present

## 2021-12-14 DIAGNOSIS — Z85038 Personal history of other malignant neoplasm of large intestine: Secondary | ICD-10-CM | POA: Diagnosis not present

## 2021-12-14 DIAGNOSIS — Z85828 Personal history of other malignant neoplasm of skin: Secondary | ICD-10-CM | POA: Diagnosis not present

## 2021-12-14 DIAGNOSIS — G629 Polyneuropathy, unspecified: Secondary | ICD-10-CM | POA: Diagnosis present

## 2021-12-14 DIAGNOSIS — T380X5A Adverse effect of glucocorticoids and synthetic analogues, initial encounter: Secondary | ICD-10-CM | POA: Diagnosis not present

## 2021-12-14 LAB — CBC
HCT: 29.2 % — ABNORMAL LOW (ref 36.0–46.0)
Hemoglobin: 9.7 g/dL — ABNORMAL LOW (ref 12.0–15.0)
MCH: 31.2 pg (ref 26.0–34.0)
MCHC: 33.2 g/dL (ref 30.0–36.0)
MCV: 93.9 fL (ref 80.0–100.0)
Platelets: 314 10*3/uL (ref 150–400)
RBC: 3.11 MIL/uL — ABNORMAL LOW (ref 3.87–5.11)
RDW: 14.1 % (ref 11.5–15.5)
WBC: 5.5 10*3/uL (ref 4.0–10.5)
nRBC: 0 % (ref 0.0–0.2)

## 2021-12-14 LAB — PROCALCITONIN: Procalcitonin: <0.1 ng/mL

## 2021-12-14 LAB — BASIC METABOLIC PANEL
Anion gap: 8 (ref 5–15)
BUN: 43 mg/dL — ABNORMAL HIGH (ref 8–23)
CO2: 25 mmol/L (ref 22–32)
Calcium: 9.1 mg/dL (ref 8.9–10.3)
Chloride: 97 mmol/L — ABNORMAL LOW (ref 98–111)
Creatinine, Ser: 1.64 mg/dL — ABNORMAL HIGH (ref 0.44–1.00)
GFR, Estimated: 30 mL/min — ABNORMAL LOW (ref >60)
Glucose, Bld: 141 mg/dL — ABNORMAL HIGH (ref 70–99)
Potassium: 4.9 mmol/L (ref 3.5–5.1)
Sodium: 130 mmol/L — ABNORMAL LOW (ref 135–145)

## 2021-12-14 LAB — CULTURE, BLOOD (ROUTINE X 2)
Culture: NO GROWTH
Culture: NO GROWTH
Special Requests: ADEQUATE

## 2021-12-14 LAB — LACTIC ACID, PLASMA: Lactic Acid, Venous: 0.8 mmol/L (ref 0.5–1.9)

## 2021-12-14 MED ORDER — FUROSEMIDE 20 MG PO TABS
20.0000 mg | ORAL_TABLET | Freq: Every day | ORAL | Status: DC
  Administered 2021-12-15 – 2021-12-24 (×10): 20 mg via ORAL
  Filled 2021-12-14 (×10): qty 1

## 2021-12-14 MED ORDER — TECHNETIUM TO 99M ALBUMIN AGGREGATED
4.0000 | Freq: Once | INTRAVENOUS | Status: AC | PRN
  Administered 2021-12-14: 4.44 via INTRAVENOUS

## 2021-12-14 MED ORDER — RIVAROXABAN 20 MG PO TABS: 20.0000 mg | ORAL_TABLET | Freq: Every day | ORAL | Status: DC

## 2021-12-14 MED ORDER — MOLNUPIRAVIR EUA 200MG CAPSULE
4.0000 | ORAL_CAPSULE | Freq: Two times a day (BID) | ORAL | Status: AC
  Administered 2021-12-14 – 2021-12-18 (×10): 800 mg via ORAL
  Filled 2021-12-14 (×2): qty 4

## 2021-12-14 MED ORDER — SODIUM CHLORIDE 0.9 % IV SOLN: INTRAVENOUS | Status: DC

## 2021-12-14 MED ORDER — RIVAROXABAN 15 MG PO TABS
15.0000 mg | ORAL_TABLET | Freq: Two times a day (BID) | ORAL | Status: DC
  Administered 2021-12-14 – 2021-12-15 (×2): 15 mg via ORAL
  Filled 2021-12-14 (×3): qty 1

## 2021-12-14 NOTE — ED Notes (Signed)
Attempted to draw blood from IV site-unsuccessful.  Using butterfly attempted x2 which was also unsuccessful.  Lab notified: requesting they draw.

## 2021-12-14 NOTE — ED Notes (Signed)
Pt trialled off oxygen and O2 remained between 91-92% RA.  Pt placed back on 1.5L [place] and O2 increased to 95%.   Family reports SOB increases significantly w/ exertion.

## 2021-12-14 NOTE — ED Notes (Signed)
Pt to nuc med

## 2021-12-14 NOTE — Assessment & Plan Note (Signed)
Blood pressure within goal. -Continuing home amlodipine -Restart home Lasix from tomorrow -Keep holding irbesartan

## 2021-12-14 NOTE — ED Notes (Signed)
Lab staff reports that he was unable to obtain blood for testing also. Valetta Fuller NP notified.

## 2021-12-14 NOTE — ED Notes (Addendum)
Nucmed report received from Dr. Burt Ek.  Positive profusion scan.  Dr. Reesa Chew notified.

## 2021-12-14 NOTE — Progress Notes (Signed)
Progress Note   Patient: [name] AJO:[id number] DOB: Apr 05, 1928 DOA: 12/13/2021     0 DOS: the patient was seen and examined on 12/14/2021   Brief hospital course: Ms. Filippa Yarbough is a 88 year old female with history of hypertension, neuropathy, hyperlipidemia, back spasms, osteoarthritis, who presents to the emergency department for chief concerns of shortness of breath.  Initial vitals in the emergency department showed temperature of 97.8, respiration rate of 20, heart rate of 63, blood pressure 132/55, SPO2 of 86% on room air and improved to 100% on 3 L nasal cannula.  Serum sodium is 124, potassium 5.5, chloride 91, bicarb 22, BUN of 48, serum creatinine of 2.09, GFR 22, nonfasting blood glucose 112, WBC 8.9, hemoglobin 10.5, platelets of 301.  Presently troponin is 17.  BNP is 228.6.  D-dimer elevated at 2.63.  COVID/influenza A/influenza B PCR positive for COVID.  EDP ordered a VQ scan that is pending and bilateral ultrasound of the lower extremities to assess for DVT.  ED treatment: Furosemide 40 mg IV one-time dose, sodium chloride 1 g tablet.  10/31: Sodium improved to 130.  Creatinine at 1.64.  Lower extremity venous Doppler was negative for DVT but VQ scan with high probability of PE. Patient was started on molnupiravir and Xarelto. History of GI bleed on Eliquis, husband was reluctant to restart Eliquis.  Agrees to give Xarelto a trial.  Patient is high risk for deterioration and mortality based on advanced age, underlying comorbidities and recent PE with COVID-19 pneumonia   Assessment and Plan: * Acute hypoxemic respiratory failure due to COVID-19 [facility]) -Start her on molnupiravir. - Solu-Medrol IV with tapering to p.o. prednisone - Symptomatic support: Robitussin-DM and Tussionex as needed for cough ordered - Airborne and contact precautions -Supplemental oxygen-wean as tolerated  Pneumonia due to COVID-19 virus Procalcitonin negative -Molnupiravir -  Solu-Medrol - Oxygen supplementation  Acute pulmonary embolism (HCC) VQ scan with high probability of PE.  Lower extremity venous Doppler was negative for DVT. Patient has an history of GI bleed with Eliquis, aspirin little reluctant to restart but agrees to try Xarelto. -Xarelto per pharmacy  Shortness of breath - Multifactorial including heart failure exacerbation in setting of COVID-19 infection, COVID-19 pneumonia and PE.  Hyponatremia - With COVID-19 infection and elevated BNP, avoiding IV fluid at this time.  Low urine osmolality and urinary sodium of 18. - Sodium chloride 1 g p.o. ordered by EDP -Monitor sodium  AKI (acute kidney injury) ([place]) Some improvement of creatinine. - Baseline serum creatinine is 1.12-1.22/GFR 42-47 - Etiology is multifactorial including cardiorenal versus intrarenal in setting of COVID-19 infection -Monitor renal function -Avoid nephrotoxins  Hyperkalemia Resolved. - Presumed secondary to acute kidney injury  Hypercholesteremia - Simvastatin 10 mg daily resumed  Restless legs syndrome - Gabapentin 200 mg nightly  At risk for polypharmacy - Resumed home tramadol 50 mg nightly.  For moderate pain - Ordered oxycodone 5 mg p.o. 3 times daily as needed for severe pain - Patient takes gabapentin 200 mg nightly  Essential hypertension Blood pressure within goal. -Continuing home amlodipine -Restart home Lasix from tomorrow -Keep holding irbesartan  Subjective: Patient was seen and examined today.  Continued to have some shortness of breath, no baseline oxygen use.  Physical Exam: Vitals:   12/14/21 1500 12/14/21 1530 12/14/21 1600 12/14/21 1630  BP: (!) 133/41 (!) 138/50 (!) 110/52 (!) 124/52  Pulse: 66 80 63 60  Resp: '20 15 14 13  '$ Temp:   (!) 97.5 F (36.4 C)  TempSrc:   Oral   SpO2: 99% 94% 99% 99%  Weight:       General.  Frail elderly lady, in no acute distress. Pulmonary.  Some scattered bilateral crackles ,normal respiratory  effort. CV.  Regular rate and rhythm, no JVD, rub or murmur. Abdomen.  Soft, nontender, nondistended, BS positive. CNS.  Alert and oriented .  No focal neurologic deficit. Extremities.  2+ LE edema, no cyanosis, pulses intact and symmetrical. Psychiatry.  Appears to have some cognitive impairment  Data Reviewed: Prior data reviewed  Family Communication: Discussed with husband and grandson at bedside  Disposition: Status is: Inpatient Remains inpatient appropriate because: Severity of illness   Planned Discharge Destination: Home with Home Health  DVT prophylaxis-Xarelto Time spent: 50 minutes  This record has been created using Systems analyst. Errors have been sought and corrected,but may not always be located. Such creation errors do not reflect on the standard of care.  Author: Lorella Nimrod, MD 12/14/2021 5:37 PM  For on call review [email or web address].

## 2021-12-14 NOTE — ED Notes (Signed)
Spoke with pt concerning our need to redraw labs.  Pt reports that she would rather not have any more attempts tonight.

## 2021-12-14 NOTE — Assessment & Plan Note (Signed)
VQ scan with high probability of PE.  Lower extremity venous Doppler was negative for DVT. Patient has an history of GI bleed with Eliquis, aspirin little reluctant to restart but agrees to try Xarelto. -Xarelto per pharmacy

## 2021-12-14 NOTE — ED Notes (Addendum)
Hospitalist at bedside.  Pt found to be on 3L [place].  O2 turned down to 2L [place] per Hospitalist.

## 2021-12-14 NOTE — ED Notes (Signed)
IV fluid stopped per Hospitalist.

## 2021-12-15 ENCOUNTER — Encounter: Payer: Self-pay | Admitting: Internal Medicine

## 2021-12-15 DIAGNOSIS — U071 COVID-19: Secondary | ICD-10-CM | POA: Diagnosis not present

## 2021-12-15 DIAGNOSIS — J9601 Acute respiratory failure with hypoxia: Secondary | ICD-10-CM | POA: Diagnosis not present

## 2021-12-15 LAB — CBC
HCT: 30.5 % — ABNORMAL LOW (ref 36.0–46.0)
Hemoglobin: 10 g/dL — ABNORMAL LOW (ref 12.0–15.0)
MCH: 31.5 pg (ref 26.0–34.0)
MCHC: 32.8 g/dL (ref 30.0–36.0)
MCV: 96.2 fL (ref 80.0–100.0)
Platelets: 380 10*3/uL (ref 150–400)
RBC: 3.17 MIL/uL — ABNORMAL LOW (ref 3.87–5.11)
RDW: 14.2 % (ref 11.5–15.5)
WBC: 11.5 10*3/uL — ABNORMAL HIGH (ref 4.0–10.5)
nRBC: 0 % (ref 0.0–0.2)

## 2021-12-15 LAB — COMPREHENSIVE METABOLIC PANEL
ALT: 12 U/L (ref 0–44)
AST: 16 U/L (ref 15–41)
Albumin: 3.3 g/dL — ABNORMAL LOW (ref 3.5–5.0)
Alkaline Phosphatase: 47 U/L (ref 38–126)
Anion gap: 8 (ref 5–15)
BUN: 38 mg/dL — ABNORMAL HIGH (ref 8–23)
CO2: 25 mmol/L (ref 22–32)
Calcium: 9.4 mg/dL (ref 8.9–10.3)
Chloride: 100 mmol/L (ref 98–111)
Creatinine, Ser: 1.34 mg/dL — ABNORMAL HIGH (ref 0.44–1.00)
GFR, Estimated: 38 mL/min — ABNORMAL LOW (ref >60)
Glucose, Bld: 165 mg/dL — ABNORMAL HIGH (ref 70–99)
Potassium: 4.8 mmol/L (ref 3.5–5.1)
Sodium: 133 mmol/L — ABNORMAL LOW (ref 135–145)
Total Bilirubin: 1.1 mg/dL (ref 0.3–1.2)
Total Protein: 6.6 g/dL (ref 6.5–8.1)

## 2021-12-15 LAB — C-REACTIVE PROTEIN: CRP: 1.3 mg/dL — ABNORMAL HIGH (ref <1.0)

## 2021-12-15 LAB — PROCALCITONIN: Procalcitonin: <0.1 ng/mL

## 2021-12-15 MED ORDER — ADULT MULTIVITAMIN W/MINERALS CH
1.0000 | ORAL_TABLET | Freq: Every day | ORAL | Status: DC
  Administered 2021-12-15 – 2021-12-24 (×10): 1 via ORAL
  Filled 2021-12-15 (×10): qty 1

## 2021-12-15 MED ORDER — ENSURE ENLIVE PO LIQD
237.0000 mL | Freq: Two times a day (BID) | ORAL | Status: DC
  Administered 2021-12-16: 237 mL via ORAL

## 2021-12-15 MED ORDER — RIVAROXABAN 15 MG PO TABS
15.0000 mg | ORAL_TABLET | Freq: Two times a day (BID) | ORAL | Status: DC
  Administered 2021-12-15 – 2021-12-24 (×18): 15 mg via ORAL
  Filled 2021-12-15 (×22): qty 1

## 2021-12-15 MED ORDER — RIVAROXABAN 20 MG PO TABS: 20.0000 mg | ORAL_TABLET | Freq: Every day | ORAL | Status: DC

## 2021-12-15 NOTE — Assessment & Plan Note (Addendum)
VQ scan with high probability of PE.  Lower extremity venous Doppler was negative for DVT. Patient has an history of GI bleed with Eliquis, husband little reluctant to restart but agrees to try Xarelto. -Started on Xarelto -remained stable with no obvious bleeding -Continue Xarelto and monitor

## 2021-12-15 NOTE — Assessment & Plan Note (Signed)
Blood pressure within goal. -Continuing home amlodipine -Restart home Lasix  -Keep holding irbesartan

## 2021-12-15 NOTE — Progress Notes (Signed)
Progress Note   Patient: [name]:[id number] DOB: 12/30/1928 DOA: 12/13/2021     1 DOS: the patient was seen and examined on 12/15/2021   Brief hospital course: [name] is a 88 year old female with history of hypertension, neuropathy, hyperlipidemia, back spasms, osteoarthritis, who presents to the emergency department for chief concerns of shortness of breath.  Initial vitals in the emergency department showed temperature of 97.8, respiration rate of 20, heart rate of 63, blood pressure 132/55, SPO2 of 86% on room air and improved to 100% on 3 L nasal cannula.  Serum sodium is 124, potassium 5.5, chloride 91, bicarb 22, BUN of 48, serum creatinine of 2.09, GFR 22, nonfasting blood glucose 112, WBC 8.9, hemoglobin 10.5, platelets of 301.  Presently troponin is 17.  BNP is 228.6.  D-dimer elevated at 2.63.  COVID/influenza A/influenza B PCR positive for COVID.  EDP ordered a VQ scan that is pending and bilateral ultrasound of the lower extremities to assess for DVT.  ED treatment: Furosemide 40 mg IV one-time dose, sodium chloride 1 g tablet.  10/31: Sodium improved to 130.  Creatinine at 1.64.  Lower extremity venous Doppler was negative for DVT but VQ scan with high probability of PE. Patient was started on molnupiravir and Xarelto. History of GI bleed on Eliquis, husband was reluctant to restart Eliquis.  Agrees to give Xarelto a trial.  Patient is high risk for deterioration and mortality based on advanced age, underlying comorbidities and recent PE with COVID-19 pneumonia. Palliative care consult.  11/1: Patient was started on Xarelto yesterday.  Per family she appears little more lethargic and confused today, they were concerned that it is secondary to Xarelto, although less likely. Patient was able to answer questions appropriately with me.  Appears lethargic.  Multiple underlying comorbidities which are contributory at this stage.  Awaiting palliative care  consult.   Assessment and Plan: * Acute hypoxemic respiratory failure due to COVID-19 [facility]) -Start her on molnupiravir. - Solu-Medrol IV with tapering to p.o. prednisone - Symptomatic support: Robitussin-DM and Tussionex as needed for cough ordered - Airborne and contact precautions -Supplemental oxygen-wean as tolerated  Pneumonia due to COVID-19 virus Procalcitonin remained negative, worsening leukocytosis most likely secondary to steroid -Molnupiravir - Steroid - Oxygen supplementation  Acute pulmonary embolism (HCC) VQ scan with high probability of PE.  Lower extremity venous Doppler was negative for DVT. Patient has an history of GI bleed with Eliquis, husband little reluctant to restart but agrees to try Xarelto. -Started on Xarelto last night. -Continue Xarelto and monitor  Shortness of breath - Multifactorial including heart failure exacerbation in setting of COVID-19 infection, COVID-19 pneumonia and PE.  Hyponatremia Improving, sodium at 133  With COVID-19 infection and elevated BNP, avoiding IV fluid at this time.  Low urine osmolality and urinary sodium of 18. - Sodium chloride 1 g p.o. ordered by EDP -Home Lasix was restarted -Monitor sodium  AKI (acute kidney injury) ([place]) Some improvement of creatinine, 164>>1.34 - Baseline serum creatinine is 1.12-1.22/GFR 42-47 - Etiology is multifactorial including cardiorenal versus intrarenal in setting of COVID-19 infection -Monitor renal function -Avoid nephrotoxins  Hyperkalemia Resolved. - Presumed secondary to acute kidney injury  Hypercholesteremia - Simvastatin 10 mg daily resumed  Restless legs syndrome - Gabapentin 200 mg nightly  At risk for polypharmacy - Resumed home tramadol 50 mg nightly.  For moderate pain - Ordered oxycodone 5 mg p.o. 3 times daily as needed for severe pain - Patient takes gabapentin 200 mg nightly -  Patient currently not requiring any tramadol or oxycodone  Essential  hypertension Blood pressure within goal. -Continuing home amlodipine -Restart home Lasix  -Keep holding irbesartan   Subjective: Patient was seen and examined today.  No new complaints.  She was able to answer questions appropriately.  Still having some cough.  Feeling weak.  Physical Exam: Vitals:   12/15/21 1230 12/15/21 1300 12/15/21 1330 12/15/21 1357  BP: 126/65 (!) 110/55 (!) 108/40   Pulse: 81 70 64   Resp: '19 13 16   '$ Temp:    (!) 97.5 F (36.4 C)  TempSrc:    Oral  SpO2: 92% 99% 99%   Weight:       General.  Frail and chronically ill-appearing elderly lady, in no acute distress. Pulmonary.  Few basal crackles bilaterally, normal respiratory effort. CV.  Regular rate and rhythm, no JVD, rub or murmur. Abdomen.  Soft, nontender, nondistended, BS positive. CNS.  Alert and oriented .  No focal neurologic deficit. Extremities.  Trace LE edema, no cyanosis, pulses intact and symmetrical. Psychiatry.  Appears to have some cognitive impairment  Data Reviewed: Prior data reviewed  Family Communication: Discussed with daughter on phone  Disposition: Status is: Inpatient Remains inpatient appropriate because: Severity of illness   Planned Discharge Destination: Home with Home Health  DVT prophylaxis-Xarelto Time spent: 50 minutes  This record has been created using Systems analyst. Errors have been sought and corrected,but may not always be located. Such creation errors do not reflect on the standard of care.  Author: Lorella Nimrod, MD 12/15/2021 1:59 PM  For on call review [email or web address].

## 2021-12-15 NOTE — Assessment & Plan Note (Signed)
-   Resumed home tramadol 50 mg nightly.  For moderate pain - Ordered oxycodone 5 mg p.o. 3 times daily as needed for severe pain - Patient takes gabapentin 200 mg nightly -Patient currently not requiring any tramadol or oxycodone

## 2021-12-15 NOTE — Progress Notes (Signed)
Initial Nutrition Assessment  DOCUMENTATION CODES:   Not applicable  INTERVENTION:   -MVI with minerals daily -Ensure Enlive po BID, each supplement provides 350 kcal and 20 grams of protein -Liberalize diet to 2 gram sodium for wider variety of meal selections -RD provided "Low Sodium Nutrition Therapy" handout from [facility] Nutrition Care Manual; attached to AVS/ discharge summary  NUTRITION DIAGNOSIS:   Increased nutrient needs related to acute illness (COVID-19) as evidenced by estimated needs.  GOAL:   Patient will meet greater than or equal to 90% of their needs  MONITOR:   PO intake, Supplement acceptance  REASON FOR ASSESSMENT:   Rounds    ASSESSMENT:   Pt with history of hypertension, neuropathy, hyperlipidemia, back spasms, osteoarthritis, who presents for chief concerns of shortness of breath.  Pt admitted with acute hypoxemic respiratory failure due to COVID-19 and pneumonia.   Reviewed I/O's: -850 ml x 24 hours and -1.4 L since admission  UOP: 850 ml x 24 hours  Pt unavailable at time of visit. RD unable to obtain further nutrition-related history or complete nutrition-focused physical exam at this time.    Pt currently on a heart healthy diet. No meal completion data available to assess at this time.  Reviewed wt hx; wt has been stable over the past year. Pt with edema, which may be masking true weight loss as well as fat and muscle depletions.   Pt with increased nutritional needs for acute illness and would benefit from addition of oral nutrition supplements.    Medications reviewed and include lasix, solu-medrol, and reglan.   Labs reviewed. Na: 133, CBGS: 132.  Diet Order:   Diet Order             Diet Heart Room service appropriate? Yes; Fluid consistency: Thin  Diet effective now                   EDUCATION NEEDS:   No education needs have been identified at this time  Skin:  Skin Assessment: Skin Integrity Issues: Skin Integrity  Issues:: Other (Comment) Other: skin tears to rt and lt upper arm  Last BM:  12/12/21  Height:   Ht Readings from Last 1 Encounters:  11/17/21 '5\' 3"'$  (1.6 m)    Weight:   Wt Readings from Last 1 Encounters:  12/13/21 71.2 kg    Ideal Body Weight:  52.3 kg  BMI:  Body mass index is 27.81 kg/m.  Estimated Nutritional Needs:   Kcal:  1550-1750  Protein:  80-95 grams  Fluid:  > 1.5 L    Loistine Chance, RD, LDN, [place] Registered Dietitian II Certified Diabetes Care and Education Specialist Please refer to [facility] for RD and/or RD on-call/weekend/after hours pager

## 2021-12-15 NOTE — ED Notes (Signed)
Dietary notified of ENSURE order.

## 2021-12-15 NOTE — ED Notes (Signed)
Pt was found to have removed oxygen.  O2 sat between 88-90%.  1.5L [place] replaced and O2 sat increased to 96%.

## 2021-12-15 NOTE — Assessment & Plan Note (Signed)
Resolved. - Presumed secondary to acute kidney injury

## 2021-12-15 NOTE — ED Notes (Signed)
Pt resting calm and comfortably at this time. Pt assisted with breakfast tray. This RN checked pt to make sure the purewick did not overflow and that pt was not soiled. Pt is disoriented to place.

## 2021-12-15 NOTE — Assessment & Plan Note (Addendum)
Some improvement of creatinine, 164>>1.34>1.12 - Baseline serum creatinine is 1.12-1.22/GFR 42-47 - Etiology is multifactorial including cardiorenal versus intrarenal in setting of COVID-19 infection -Monitor renal function -Avoid nephrotoxins

## 2021-12-15 NOTE — ED Notes (Signed)
Informed RN bed assigned 

## 2021-12-15 NOTE — Assessment & Plan Note (Addendum)
Procalcitonin remained negative, worsening leukocytosis most likely secondary to steroid -Completed a course of molnupiravir and steroid - Oxygen supplementation as needed

## 2021-12-15 NOTE — Discharge Instructions (Signed)

## 2021-12-15 NOTE — ED Notes (Signed)
Pt seems more confused/disoriented compared to yesterday.  Family is concerned the new blood thinner is causing the confusion.  Family reports Pt is acting similar to how she did when she was previously placed on Eliquis.  Sts "it's like she is in a fog."

## 2021-12-15 NOTE — ED Notes (Signed)
Pt had a medium size BM that was semi soft. Pt cleaned up, new brief, purewick and chux placed. Pt denies any other needs at this time.

## 2021-12-15 NOTE — ED Notes (Signed)
Secretary to Psychologist, educational.

## 2021-12-15 NOTE — Assessment & Plan Note (Signed)
Improving, sodium at 133  With COVID-19 infection and elevated BNP, avoiding IV fluid at this time.  Low urine osmolality and urinary sodium of 18. - Sodium chloride 1 g p.o. ordered by EDP -Home Lasix was restarted -Monitor sodium

## 2021-12-15 NOTE — Assessment & Plan Note (Addendum)
Respiratory status improved, now on room air Completed the course of molnupiravir and steroid - Symptomatic support: Robitussin-DM and Tussionex as needed for cough ordered - Airborne and contact precautions -

## 2021-12-16 ENCOUNTER — Telehealth (HOSPITAL_COMMUNITY): Payer: Self-pay | Admitting: Pharmacy Technician

## 2021-12-16 ENCOUNTER — Other Ambulatory Visit (HOSPITAL_COMMUNITY): Payer: Self-pay

## 2021-12-16 DIAGNOSIS — U071 COVID-19: Secondary | ICD-10-CM | POA: Diagnosis not present

## 2021-12-16 DIAGNOSIS — J9601 Acute respiratory failure with hypoxia: Secondary | ICD-10-CM | POA: Diagnosis not present

## 2021-12-16 LAB — PROCALCITONIN: Procalcitonin: <0.1 ng/mL

## 2021-12-16 MED ORDER — PROSOURCE PLUS PO LIQD
30.0000 mL | Freq: Three times a day (TID) | ORAL | Status: DC
  Administered 2021-12-16 – 2021-12-21 (×8): 30 mL via ORAL

## 2021-12-16 MED ORDER — BOOST / RESOURCE BREEZE PO LIQD CUSTOM
1.0000 | Freq: Three times a day (TID) | ORAL | Status: DC
  Administered 2021-12-16 – 2021-12-21 (×7): 1 via ORAL

## 2021-12-16 NOTE — Evaluation (Signed)
Clinical/Bedside Swallow Evaluation Patient Details  Name: [name] MRN: [id number] Date of Birth: 07-31-1928  Today's Date: 12/16/2021 Time: SLP Start Time (ACUTE ONLY): 59 SLP Stop Time (ACUTE ONLY): 74 SLP Time Calculation (min) (ACUTE ONLY): 30 min  Past Medical History:  Past Medical History:  Diagnosis Date   Anemia    Barrett's esophagus    BCC (basal cell carcinoma of skin) 03/07/2013   vertex scalp - NODULAR NODULAR PATTERN PATTERN   Bowel obstruction (HCC)    s/p adhesion resection   CHF (congestive heart failure) (HCC)    Chronic back pain    Colon cancer ([place]) 1988 and 1989   adenocarcinoma, s/p resection x  2 and chemo   Diverticulitis    GERD (gastroesophageal reflux disease)    H/O open leg wound    Hiatal hernia    Hypercholesteremia    Hypertension    Lumbar scoliosis    OA (osteoarthritis)    Peripheral neuropathy    Recurrent sinus infections    Renal cyst    S/P chemotherapy, time since greater than 12 weeks    colon cancer   Vertigo    Past Surgical History:  Past Surgical History:  Procedure Laterality Date   ABDOMINAL HYSTERECTOMY  1982   adhesions resected     bowel obstruction   APPENDECTOMY     BREAST BIOPSY Left    negative 06/14/1985   CHOLECYSTECTOMY  1994   COLON RESECTION     x2.  s/p colon cancer   COLON SURGERY  1958 and 1989   For Colon Cancer   EXCISIONAL HEMORRHOIDECTOMY     with tubal ligation   EXPLORATORY LAPAROTOMY  1991   Secondary to SBO   HPI:  Per H&P: "[name] is a 88 year old female with history of hypertension, neuropathy, hyperlipidemia, back spasms, osteoarthritis, who presents to the emergency department for chief concerns of shortness of breath." Pt's chief diagnosis is Acute hypoxemic respiratory failure due to COVID-19. DG Chest 12/13/21: Cardiomegaly. There are no signs of pulmonary edema or focal pulmonary consolidation. There are small linear densities in left parahilar region suggesting  scarring or subsegmental atelectasis. Blunting of left lateral CP angle may be due to minimal effusion or pleural thickening. Moderate to large fixed hiatal hernia. Pt's daughter present for the duration of assessment. Pt is currently on a regular solids and thin liquids with 2L nasal canula.    Assessment / Plan / Recommendation  Clinical Impression  Pt presents with suspected functional oropharyngeal swallow in the setting of hypoxemic respiratory failure due to COVID-19 and hiatal hernia. Pt agreeable to completion of assessment. Pt sitting upright in bed, on 2L nasal canula, and grossly functional oral motor ability. Congested cough noted prior to initial PO trial, with additional cough noted delayed after the swallow. No other overt/subtle indicated of aspiration noted. Pt endorses increased coughing in general, but not specifically timed with eating/drinking. Oral manipulation slightly prolonged, but complete with minimal oral residue. Suspect that overall endurance is impacting energy for act of eating.       Education shared with pt/daughter regarding impact of lower GI (hiatal hernia) on swallow function/comfort, age related changes to swallow function, aspiration precautions, and monitoring endurance for task of eating. All reported understanding. Recommend continued regular solids (with modification of cut meats and extra sauces/gravies) for ease of consumption and thin liquids. Aspiration precautions (slow rate, small bites, elevated HOB, and alert for PO intake) recommended. MD and RN aware and in  agreement with recommendations. No further follow up SLP services indicated. SLP Visit Diagnosis: Dysphagia, oropharyngeal phase (R13.12)    Aspiration Risk  Mild aspiration risk    Diet Recommendation   Regular solids (cut meats and extra sauces) and thin liquids   Medication Administration: Whole meds with liquid    Other  Recommendations Oral Care Recommendations: Oral care BID     Recommendations for follow up therapy are one component of a multi-disciplinary discharge planning process, led by the attending physician.  Recommendations may be updated based on patient status, additional functional criteria and insurance authorization.  Follow up Recommendations No SLP follow up      Assistance Recommended at Discharge Intermittent Supervision/Assistance  Functional Status Assessment Patient has not had a recent decline in their functional status    Swallow Study   General Date of Onset: 12/16/21 HPI: Per H&P: "[name] is a 88 year old female with history of hypertension, neuropathy, hyperlipidemia, back spasms, osteoarthritis, who presents to the emergency department for chief concerns of shortness of breath." Pt's chief diagnosis is Acute hypoxemic respiratory failure due to COVID-19. DG Chest 12/13/21: Cardiomegaly. There are no signs of pulmonary edema or focal pulmonary consolidation. There are small linear densities in left parahilar region suggesting scarring or subsegmental atelectasis. Blunting of left lateral CP angle may be due to minimal effusion or pleural thickening. Moderate to large fixed hiatal hernia. Pt's daughter present for the duration of assessment. Pt is currently on a regular solids and thin liquids with 2L nasal canula. Type of Study: Bedside Swallow Evaluation Previous Swallow Assessment: none in chart Diet Prior to this Study: Regular;Thin liquids Temperature Spikes Noted: No (temp- 99.5 WBC- 11.5) Respiratory Status: Nasal cannula (2L) History of Recent Intubation: No Behavior/Cognition: Alert;Cooperative Oral Cavity Assessment: Within Functional Limits Oral Care Completed by SLP: No Oral Cavity - Dentition: Adequate natural dentition Vision: Functional for self-feeding Self-Feeding Abilities: Needs set up;Needs assist (incidental assist secondary to endurance) Patient Positioning: Upright in bed Baseline Vocal Quality:  Normal Volitional Cough: Congested    Oral/Motor/Sensory Function Overall Oral Motor/Sensory Function: Within functional limits   Ice Chips Ice chips: Not tested   Thin Liquid Thin Liquid: Within functional limits Presentation:  (delayed cough x1 after approximately 1 min- congested and comparable to baseline cough)    Nectar Thick Nectar Thick Liquid: Not tested   Honey Thick Honey Thick Liquid: Not tested   Puree Puree: Within functional limits   Solid     Solid: Within functional limits     [place] Cordelle Dahmen Clapp  [facility] SLP   [place] J Clapp 12/16/2021,1:13 PM

## 2021-12-16 NOTE — Telephone Encounter (Signed)
Pharmacy Patient Advocate Encounter  Insurance verification completed.    The patient is insured through [facility] Part D   The patient is currently admitted and ran test claims for the following: Xarelto.  Copays and coinsurance results were relayed to Inpatient clinical team.

## 2021-12-16 NOTE — TOC Benefit Eligibility Note (Signed)
Patient Teacher, English as a foreign language completed.    The patient is currently admitted and upon discharge could be taking Xarelto 20 mg.  The current 30 day co-pay is $45.00.   The patient is insured through [place], [place] Patient Advocate Specialist [place] Patient Advocate Team Direct Number: [phone]  Fax: [phone]

## 2021-12-16 NOTE — Progress Notes (Signed)
Nutrition Follow-up  DOCUMENTATION CODES:   Not applicable  INTERVENTION:   -Continue MVI with minerals daily -Liberalize diet to regular -D/c Ensure -Boost Breeze po TID, each supplement provides 250 kcal and 9 grams of protein  -30 ml Prosource Plus TID, each supplement provides 100 kcals and 15 grams protein  NUTRITION DIAGNOSIS:   Increased nutrient needs related to acute illness (COVID-19) as evidenced by estimated needs.  Ongoing  GOAL:   Patient will meet greater than or equal to 90% of their needs  Progressing   MONITOR:   PO intake, Supplement acceptance  REASON FOR ASSESSMENT:   Rounds    ASSESSMENT:   Pt with history of hypertension, neuropathy, hyperlipidemia, back spasms, osteoarthritis, who presents for chief concerns of shortness of breath.  Reviewed I/O's: -55 ml x 24 hours and -1.5 L since admission  UOP: 500 ml x 24 hours   Pt sitting up in bed at time of visit. She reports feeling worse today. Noted pt consumed only a bites of eggs and few sips of Ensure. Pt reports the eggs "tasted bad" and she did not like the Ensure. PTA, pt reports good appetite consuming 2-3 meals per day. Pt has an aide who assists with meal prep and typically consumes a meat, starch, and vegetable at meals.   Pt denies any weight loss.   Discussed importance of good meal and supplement intake to promote healing. Pt amenable to Boost Breeze.   Per MD notes, awaiting palliative care consult.   Labs reviewed: Na: 133, CBGS: 132 (inpatient orders for glycemic control are none).    NUTRITION - FOCUSED PHYSICAL EXAM:  Flowsheet Row Most Recent Value  Orbital Region Mild depletion  Upper Arm Region No depletion  Thoracic and Lumbar Region No depletion  Buccal Region No depletion  Temple Region No depletion  Clavicle Bone Region Mild depletion  Clavicle and Acromion Bone Region No depletion  Scapular Bone Region No depletion  Dorsal Hand Mild depletion  Patellar  Region Mild depletion  Anterior Thigh Region Mild depletion  Posterior Calf Region Mild depletion  Edema (RD Assessment) Mild  Hair Reviewed  Eyes Reviewed  Mouth Reviewed  Skin Reviewed  Nails Reviewed       Diet Order:   Diet Order             Diet 2 gram sodium Room service appropriate? Yes; Fluid consistency: Thin  Diet effective now                   EDUCATION NEEDS:   No education needs have been identified at this time  Skin:  Skin Assessment: Skin Integrity Issues: Skin Integrity Issues:: Other (Comment) Other: skin tears to rt and lt upper arm  Last BM:  12/12/21  Height:   Ht Readings from Last 1 Encounters:  11/17/21 '5\' 3"'$  (1.6 m)    Weight:   Wt Readings from Last 1 Encounters:  12/16/21 72.3 kg    Ideal Body Weight:  52.3 kg  BMI:  Body mass index is 28.24 kg/m.  Estimated Nutritional Needs:   Kcal:  1550-1750  Protein:  80-95 grams  Fluid:  > 1.5 L    Loistine Chance, RD, LDN, [place] Registered Dietitian II Certified Diabetes Care and Education Specialist Please refer to [facility] for RD and/or RD on-call/weekend/after hours pager

## 2021-12-16 NOTE — Progress Notes (Signed)
Progress Note   Patient: [name] RXV:[id number] DOB: January 03, 1929 DOA: 12/13/2021     2 DOS: the patient was seen and examined on 12/16/2021   Brief hospital course: [name] is a 88 year old female with history of hypertension, neuropathy, hyperlipidemia, back spasms, osteoarthritis, who presents to the emergency department for chief concerns of shortness of breath.  Initial vitals in the emergency department showed temperature of 97.8, respiration rate of 20, heart rate of 63, blood pressure 132/55, SPO2 of 86% on room air and improved to 100% on 3 L nasal cannula.  Serum sodium is 124, potassium 5.5, chloride 91, bicarb 22, BUN of 48, serum creatinine of 2.09, GFR 22, nonfasting blood glucose 112, WBC 8.9, hemoglobin 10.5, platelets of 301.  Presently troponin is 17.  BNP is 228.6.  D-dimer elevated at 2.63.  COVID/influenza A/influenza B PCR positive for COVID.  EDP ordered a VQ scan that is pending and bilateral ultrasound of the lower extremities to assess for DVT.  ED treatment: Furosemide 40 mg IV one-time dose, sodium chloride 1 g tablet.  10/31: Sodium improved to 130.  Creatinine at 1.64.  Lower extremity venous Doppler was negative for DVT but VQ scan with high probability of PE. Patient was started on molnupiravir and Xarelto. History of GI bleed on Eliquis, husband was reluctant to restart Eliquis.  Agrees to give Xarelto a trial.  Patient is high risk for deterioration and mortality based on advanced age, underlying comorbidities and recent PE with COVID-19 pneumonia. Palliative care consult.  11/1: Patient was started on Xarelto yesterday.  Per family she appears little more lethargic and confused today, they were concerned that it is secondary to Xarelto, although less likely. Patient was able to answer questions appropriately with me.  Appears lethargic.  Multiple underlying comorbidities which are contributory at this stage.  Awaiting palliative care  consult.  11/2: Hemodynamically stable.  No current concern of abnormal bleeding.  Remained on 2 L of oxygen.  Daughter was also interested for SNF, PT/OT ordered.  Mentation at baseline. Palliative care consult pending.   Assessment and Plan: * Acute hypoxemic respiratory failure due to COVID-19 [facility]) -Start her on molnupiravir. - Solu-Medrol IV with tapering to p.o. prednisone - Symptomatic support: Robitussin-DM and Tussionex as needed for cough ordered - Airborne and contact precautions -Supplemental oxygen-wean as tolerated  Pneumonia due to COVID-19 virus Procalcitonin remained negative, worsening leukocytosis most likely secondary to steroid -Molnupiravir - Steroid - Oxygen supplementation  Acute pulmonary embolism (HCC) VQ scan with high probability of PE.  Lower extremity venous Doppler was negative for DVT. Patient has an history of GI bleed with Eliquis, husband little reluctant to restart but agrees to try Xarelto. -Started on Xarelto last night. -Continue Xarelto and monitor  Shortness of breath - Multifactorial including heart failure exacerbation in setting of COVID-19 infection, COVID-19 pneumonia and PE.  Hyponatremia Improving, sodium at 133  With COVID-19 infection and elevated BNP, avoiding IV fluid at this time.  Low urine osmolality and urinary sodium of 18. - Sodium chloride 1 g p.o. ordered by EDP -Home Lasix was restarted -Monitor sodium  AKI (acute kidney injury) ([place]) Some improvement of creatinine, 164>>1.34 - Baseline serum creatinine is 1.12-1.22/GFR 42-47 - Etiology is multifactorial including cardiorenal versus intrarenal in setting of COVID-19 infection -Monitor renal function -Avoid nephrotoxins  Hyperkalemia Resolved. - Presumed secondary to acute kidney injury  Hypercholesteremia - Simvastatin 10 mg daily resumed  Restless legs syndrome - Gabapentin 200 mg nightly  At  risk for polypharmacy - Resumed home tramadol 50 mg nightly.   For moderate pain - Ordered oxycodone 5 mg p.o. 3 times daily as needed for severe pain - Patient takes gabapentin 200 mg nightly -Patient currently not requiring any tramadol or oxycodone  Essential hypertension Blood pressure within goal. -Continuing home amlodipine -Restart home Lasix  -Keep holding irbesartan   Subjective: Patient continued to feel little short of breath, no other complaints.  Remained on 2 L of oxygen  Physical Exam: Vitals:   12/16/21 0556 12/16/21 0557 12/16/21 0906 12/16/21 1227  BP:  (!) 142/56 (!) 146/65 (!) 136/58  Pulse:  67 67 60  Resp:  '18 18 20  '$ Temp:  98.7 F (37.1 C) 99.5 F (37.5 C) 98.2 F (36.8 C)  TempSrc:      SpO2:  97% 98% 100%  Weight: 72.3 kg      General.  Frail elderly lady, in no acute distress. Pulmonary.  Lungs clear bilaterally, normal respiratory effort. CV.  Regular rate and rhythm, no JVD, rub or murmur. Abdomen.  Soft, nontender, nondistended, BS positive. CNS.  Alert and oriented .  No focal neurologic deficit. Extremities.  No edema, no cyanosis, pulses intact and symmetrical. Psychiatry.  Judgment and insight appears impaired.  Data Reviewed: Prior data reviewed  Family Communication: Discussed with daughter and husband at bedside  Disposition: Status is: Inpatient Remains inpatient appropriate because: Severity of illness   Planned Discharge Destination: Home with Home Health  DVT prophylaxis-Xarelto Time spent: 40 minutes  This record has been created using Systems analyst. Errors have been sought and corrected,but may not always be located. Such creation errors do not reflect on the standard of care.  Author: Lorella Nimrod, MD 12/16/2021 4:34 PM  For on call review [email or web address].

## 2021-12-16 NOTE — Evaluation (Signed)
Physical Therapy Evaluation Patient Details Name: [name] MRN: [id number] DOB: [birth year] Today's Date: 12/16/2021  History of Present Illness  88yo F pt has been admitted 2 times in the last few weeks, most recently after sudden severe weakness at home and fall to the floor while AMB in house. Found to also be Covid + on this admission, requiring supplemental O2 here. PMH: dCHF, HTN, PAD, colon cancer s/p resection x2, back pain. At baseline pt uses a 4WW for limited household distances of 40-60', requires physical assist for all transfers.  Clinical Impression  Pt pleasant and willing to participate but did have general confusion, c/o acute and chronic pain and despite good effort was very functionally limited.  Pt was here a few days ago and struggled to do much with PT at that time, she is even weaker now.  Pt does not normally need O2, required 2L to stay in the 90s with even minimal activity.  Pt had recently (~1 month ago) been able to ambulate, slowly, but relatively safely in the home but is now far from her baseline and struggled to even attain/maintain standing.      Recommendations for follow up therapy are one component of a multi-disciplinary discharge planning process, led by the attending physician.  Recommendations may be updated based on patient status, additional functional criteria and insurance authorization.  Follow Up Recommendations Skilled nursing-short term rehab (<3 hours/day) Can patient physically be transported by private vehicle: No    Assistance Recommended at Discharge Frequent or constant Supervision/Assistance  Patient can return home with the following  Two people to help with walking and/or transfers;A lot of help with bathing/dressing/bathroom;Assist for transportation;Help with stairs or ramp for entrance;Assistance with cooking/housework    Equipment Recommendations    Recommendations for Other Services       Functional Status Assessment Patient  has had a recent decline in their functional status and demonstrates the ability to make significant improvements in function in a reasonable and predictable amount of time.     Precautions / Restrictions Precautions Precautions: Fall Restrictions Weight Bearing Restrictions: No      Mobility  Bed Mobility Overal bed mobility: Needs Assistance Bed Mobility: Supine to Sit, Sit to Supine     Supine to sit: Mod assist Sit to supine: Max assist   General bed mobility comments: Pt made good effort with getting to EOB but ultimately needed considerable assist to attain sitting and then scoot toward EOB    Transfers Overall transfer level: Needs assistance Equipment used: Rolling walker (2 wheels) Transfers: Sit to/from Stand Sit to Stand: Max assist           General transfer comment: Pt made good effort in getting to standing but ultimately struggled and needed heavy assist to keep weight forward.  Pt with retro lean  and needing constant assist to maintain upright, that occasionally was max assist.  Pt struggled to get weight forward over walker.    Ambulation/Gait               General Gait Details: unsafe to attempt  Stairs            Wheelchair Mobility    Modified Rankin (Stroke Patients Only)       Balance Overall balance assessment: Needs assistance Sitting-balance support: Single extremity supported, Feet supported, Bilateral upper extremity supported Sitting balance-Leahy Scale: Fair     Standing balance support: Bilateral upper extremity supported, Reliant on assistive device for balance Standing balance-Leahy Scale:  Poor                               Pertinent Vitals/Pain Pain Assessment Faces Pain Scale: Hurts little more Pain Location: general soreness from falls, chronic back pain    Home Living Family/patient expects to be discharged to:: Skilled nursing facility Living Arrangements: Spouse/significant other Available  Help at Discharge: Family;Available 24 hours/day;Available PRN/intermittently Type of Home: House Home Access: Stairs to enter;Ramped entrance   Entrance Stairs-Number of Steps: 1   Home Layout: One level Home Equipment: Rollator (4 wheels);Shower seat;Hand held shower head;Other (comment) (lift chair)      Prior Function Prior Level of Function : Needs assist;History of Falls (last six months)             Mobility Comments: Pt requires lift recliner and/or family to assist with transfers, using rollator for walking short distances (was previously able to walk 50-60' at a time but recently has been <10 ft with family assist). Per family, a w/c will not fit through doorways at home. ADLs Comments: Pt requires assist for LB dressing, bathing from seated position, meals, housekeeping, driving, and med mgt. Daughters come nearly daily to provide meals and assist with housekeeping tasks and transportation. Great family support available.     Hand Dominance        Extremity/Trunk Assessment   Upper Extremity Assessment Upper Extremity Assessment: Generalized weakness    Lower Extremity Assessment Lower Extremity Assessment: Generalized weakness       Communication   Communication: HOH  Cognition Arousal/Alertness: Awake/alert Behavior During Therapy: WFL for tasks assessed/performed Overall Cognitive Status: History of cognitive impairments - at baseline                                 General Comments: daughter reports some baseline confusion, but that she is more confused/altered at this time        General Comments General comments (skin integrity, edema, etc.): On 2L on arrival, SpO2 in the high 90s, maintained low 90s on room air at rest but SpO2 dropped to low 80s quickly with any activity on room air.  Reapplied 2L with slow increase to low 90s.    Exercises     Assessment/Plan    PT Assessment Patient needs continued PT services  PT Problem List  Decreased strength;Decreased range of motion;Decreased activity tolerance;Decreased balance;Decreased mobility;Decreased knowledge of use of DME;Decreased knowledge of precautions       PT Treatment Interventions DME instruction;Balance training;Gait training;Stair training;Functional mobility training;Therapeutic activities;Therapeutic exercise;Patient/family education    PT Goals (Current goals can be found in the Care Plan section)  Acute Rehab PT Goals Patient Stated Goal: regain ability to perform household AMB PT Goal Formulation: With patient Time For Goal Achievement: 12/29/21 Potential to Achieve Goals: Fair    Frequency Min 2X/week     Co-evaluation               AM-PAC PT "6 Clicks" Mobility  Outcome Measure Help needed turning from your back to your side while in a flat bed without using bedrails?: A Lot Help needed moving from lying on your back to sitting on the side of a flat bed without using bedrails?: Total Help needed moving to and from a bed to a chair (including a wheelchair)?: Total Help needed standing up from a chair using your arms (e.g., wheelchair  or bedside chair)?: Total Help needed to walk in hospital room?: Total Help needed climbing 3-5 steps with a railing? : Total 6 Click Score: 7    End of Session Equipment Utilized During Treatment: Oxygen Activity Tolerance: Patient tolerated treatment well;Patient limited by fatigue Patient left: with call bell/phone within reach;with bed alarm set;with family/visitor present Nurse Communication: Mobility status PT Visit Diagnosis: Unsteadiness on feet (R26.81);Difficulty in walking, not elsewhere classified (R26.2);Muscle weakness (generalized) (M62.81)    Time: 8381-8403 PT Time Calculation (min) (ACUTE ONLY): 35 min   Charges:   PT Evaluation $PT Eval Low Complexity: 1 Low PT Treatments $Therapeutic Activity: 8-22 mins        Kreg Shropshire, DPT 12/16/2021, 4:49 PM

## 2021-12-17 DIAGNOSIS — U071 COVID-19: Secondary | ICD-10-CM | POA: Diagnosis not present

## 2021-12-17 DIAGNOSIS — J9601 Acute respiratory failure with hypoxia: Secondary | ICD-10-CM | POA: Diagnosis not present

## 2021-12-17 LAB — COMPREHENSIVE METABOLIC PANEL
ALT: 17 U/L (ref 0–44)
AST: 16 U/L (ref 15–41)
Albumin: 3.2 g/dL — ABNORMAL LOW (ref 3.5–5.0)
Alkaline Phosphatase: 48 U/L (ref 38–126)
Anion gap: 8 (ref 5–15)
BUN: 38 mg/dL — ABNORMAL HIGH (ref 8–23)
CO2: 27 mmol/L (ref 22–32)
Calcium: 9 mg/dL (ref 8.9–10.3)
Chloride: 102 mmol/L (ref 98–111)
Creatinine, Ser: 1.12 mg/dL — ABNORMAL HIGH (ref 0.44–1.00)
GFR, Estimated: 47 mL/min — ABNORMAL LOW (ref >60)
Glucose, Bld: 154 mg/dL — ABNORMAL HIGH (ref 70–99)
Potassium: 4.5 mmol/L (ref 3.5–5.1)
Sodium: 137 mmol/L (ref 135–145)
Total Bilirubin: 1 mg/dL (ref 0.3–1.2)
Total Protein: 6.1 g/dL — ABNORMAL LOW (ref 6.5–8.1)

## 2021-12-17 LAB — CBC
HCT: 29.9 % — ABNORMAL LOW (ref 36.0–46.0)
Hemoglobin: 9.8 g/dL — ABNORMAL LOW (ref 12.0–15.0)
MCH: 31.3 pg (ref 26.0–34.0)
MCHC: 32.8 g/dL (ref 30.0–36.0)
MCV: 95.5 fL (ref 80.0–100.0)
Platelets: 363 10*3/uL (ref 150–400)
RBC: 3.13 MIL/uL — ABNORMAL LOW (ref 3.87–5.11)
RDW: 14.2 % (ref 11.5–15.5)
WBC: 12.2 10*3/uL — ABNORMAL HIGH (ref 4.0–10.5)
nRBC: 0 % (ref 0.0–0.2)

## 2021-12-17 LAB — PROCALCITONIN: Procalcitonin: <0.1 ng/mL

## 2021-12-17 NOTE — NC FL2 (Signed)
[place] LEVEL OF CARE SCREENING TOOL     IDENTIFICATION  Patient Name: [name] Birthdate: Nov 17, 1928 Sex: female Admission Date (Current Location): 12/13/2021  [place] and [place] Number:  Engineering geologist and Address:  [facility], [address] [place], [place] [address]      Provider Number: [id number]  Attending Physician Name and Address:  Lorella Nimrod, MD  Relative Name and Phone Number:       Current Level of Care: Hospital Recommended Level of Care: [place] Prior Approval Number:    Date Approved/Denied:   PASRR Number: [id number]  Discharge Plan: SNF    Current Diagnoses: Patient Active Problem List   Diagnosis Date Noted   SOB (shortness of breath) 12/14/2021   Acute pulmonary embolism ([place]) 12/14/2021   Shortness of breath 12/13/2021   Pneumonia due to COVID-19 virus 12/13/2021   Hyperkalemia 12/13/2021   Acute hypoxemic respiratory failure due to COVID-19 [facility]) 12/13/2021   Hyponatremia 12/13/2021   At risk for polypharmacy 12/13/2021   Acute on chronic congestive heart failure ([place])    Acute on chronic diastolic heart failure ([place]) 12/09/2021   New onset atrial fibrillation ([place]) 11/16/2021   Acute anemia 11/16/2021   Acute respiratory failure with hypoxia ([place]) 11/16/2021   Elevated temperature 11/16/2021   Aortic atherosclerosis ([place]) 08/02/2020   Fall 12/29/2019   Weakness 06/09/2019   Wheezing 04/16/2019   Back skin lesion 12/04/2018   Restless legs syndrome 11/25/2018   PAD (peripheral artery disease) ([place]) 04/07/2018   Cold foot 05/15/2017   Constipation 08/15/2016   AKI (acute kidney injury) ([place]) 04/13/2016   Small bowel obstruction ([place]) 11/24/2015   Swelling of left lower extremity 07/16/2015   Acute Ileitis 06/12/2015   Hypokalemia 06/12/2015   Accelerated hypertension 06/12/2015   Hereditary hemochromatosis ([place]) 05/25/2015   Open leg wound 05/19/2015    Lower extremity edema 05/19/2015   Iron excess 04/16/2015   Dizziness 10/26/2014   Hyperbilirubinemia 10/26/2014   Health care maintenance 05/18/2014   Anemia, iron deficiency 04/21/2014   Hospital discharge follow-up 04/14/2014   Hot flashes 03/07/2014   Face lesion 11/03/2013   Numbness in feet 10/08/2012   Nocturia 10/08/2012   Essential hypertension 12/18/2011   Hypercholesteremia 12/18/2011   History of colon cancer 12/18/2011   Barrett's esophagus 12/18/2011   Chronic back pain 12/18/2011   Osteopenia 12/18/2011    Orientation RESPIRATION BLADDER Height & Weight     Self, Situation, Place  O2 (Nasal Cannula 2 L) Incontinent, External catheter Weight: 159 lb 6.3 oz (72.3 kg) Height:     BEHAVIORAL SYMPTOMS/MOOD NEUROLOGICAL BOWEL NUTRITION STATUS   (None)  (None) Continent Diet (Regular. Cut meats and extra sauces/gravies.)  AMBULATORY STATUS COMMUNICATION OF NEEDS Skin   Extensive Assist Verbally Bruising, Other (Comment) (Erythema/redness. Skin tear on left distal posterior upper arm: (Gauze) and right lower anterior arm (gauze).)                       Personal Care Assistance Level of Assistance  Bathing, Feeding, Dressing Bathing Assistance: Maximum assistance Feeding assistance: Limited assistance Dressing Assistance: Maximum assistance     Functional Limitations Info  Sight, Hearing, Speech Sight Info: Adequate Hearing Info: Adequate Speech Info: Adequate    SPECIAL CARE FACTORS FREQUENCY  PT (By licensed PT), OT (By licensed OT)     PT Frequency: 5 x week OT Frequency: 5 x week  Contractures Contractures Info: Not present    Additional Factors Info  Code Status, Allergies, Isolation Precautions Code Status Info: DNR Allergies Info: Fentanyl     Isolation Precautions Info: Airborne/Contact: COVID     Current Medications (12/17/2021):  This is the current hospital active medication list Current Facility-Administered Medications   Medication Dose Route Frequency Provider Last Rate Last Admin   (feeding supplement) PROSource Plus liquid 30 mL  30 mL Oral TID BM Lorella Nimrod, MD   30 mL at 12/17/21 1014   acetaminophen (TYLENOL) tablet 650 mg  650 mg Oral Q6H PRN Cox, [name] N, DO       Or   acetaminophen (TYLENOL) suppository 650 mg  650 mg Rectal Q6H PRN Cox, [name] N, DO       albuterol (VENTOLIN HFA) 108 (90 Base) MCG/ACT inhaler 2 puff  2 puff Inhalation Q6H PRN Cox, [name] N, DO       amLODipine (NORVASC) tablet 5 mg  5 mg Oral Daily Cox, [name] N, DO   5 mg at 12/17/21 0940   chlorpheniramine-HYDROcodone (TUSSIONEX) 10-8 MG/5ML suspension 5 mL  5 mL Oral Q12H PRN Cox, [name] N, DO       feeding supplement (BOOST / RESOURCE BREEZE) liquid 1 Container  1 Container Oral TID BM Lorella Nimrod, MD   1 Container at 12/16/21 1345   furosemide (LASIX) tablet 20 mg  20 mg Oral Daily Lorella Nimrod, MD   20 mg at 12/17/21 0940   gabapentin (NEURONTIN) capsule 200 mg  200 mg Oral QHS Cox, [name] N, DO   200 mg at 12/16/21 2103   guaiFENesin-dextromethorphan (ROBITUSSIN DM) 100-10 MG/5ML syrup 10 mL  10 mL Oral Q4H PRN Cox, [name] N, DO       metoCLOPramide (REGLAN) tablet 5 mg  5 mg Oral BID Cox, [name] N, DO   5 mg at 12/17/21 0940   metoprolol succinate (TOPROL-XL) 24 hr tablet 25 mg  25 mg Oral BID Cox, [name] N, DO   25 mg at 12/17/21 0940   molnupiravir EUA (LAGEVRIO) capsule 800 mg  4 capsule Oral BID Lorella Nimrod, MD   800 mg at 12/17/21 0941   multivitamin with minerals tablet 1 tablet  1 tablet Oral Daily Lorella Nimrod, MD   1 tablet at 12/17/21 0941   ondansetron (ZOFRAN) tablet 4 mg  4 mg Oral Q6H PRN Cox, [name] N, DO       Or   ondansetron (ZOFRAN) injection 4 mg  4 mg Intravenous Q6H PRN Cox, [name] N, DO       oxyCODONE (Oxy IR/ROXICODONE) immediate release tablet 5 mg  5 mg Oral TID PRN Cox, [name] N, DO       pantoprazole (PROTONIX) EC tablet 40 mg  40 mg Oral BID Cox, [name] N, DO   40 mg at 12/17/21 0940   predniSONE (DELTASONE) tablet 50 mg  50  mg Oral Daily Cox, [name] N, DO   50 mg at 12/17/21 0941   Rivaroxaban (XARELTO) tablet 15 mg  15 mg Oral BID WC Lorella Nimrod, MD   15 mg at 12/17/21 0947   Followed by   Derrill Memo ON 01/04/2022] rivaroxaban (XARELTO) tablet 20 mg  20 mg Oral Q supper Lorella Nimrod, MD       senna-docusate (Senokot-S) tablet 1 tablet  1 tablet Oral QHS PRN Cox, [name] N, DO       simvastatin (ZOCOR) tablet 10 mg  10 mg Oral q1800 Cox, [name] N, DO  10 mg at 12/16/21 1752   traMADol (ULTRAM) tablet 50 mg  50 mg Oral QHS PRN Cox, [name] N, DO         Discharge Medications: Please see discharge summary for a list of discharge medications.  Relevant Imaging Results:  Relevant Lab Results:   Additional Information SS#: [id number]. Tested positive for COVID on 10/30.  Candie Chroman, LCSW

## 2021-12-17 NOTE — TOC Initial Note (Signed)
Transition of Care [facility]) - Initial/Assessment Note    Patient Details  Name: [name] MRN: [id number] Date of Birth: May 05, 1928  [facility]) CM/SW Contact:    Candie Chroman, LCSW Phone Number: 12/17/2021, 11:28 AM  Clinical Narrative: Per MD, patient and daughter are agreeable to SNF. Called into the room and daughter [name] answered. She confirmed interest in SNF. No facility preference. Told her how to access CMS scores online. Will follow up with bed offers once available. She is aware of likelihood that she will have to quarantine for 10 days from positive COVID test and that discharge would likely be Friday 11/10. No further concerns. CSW encouraged patient's daughter to contact CSW as needed. CSW will continue to follow patient and her family for support and facilitate discharge to SNF once medically stable and quarantine complete.  Expected Discharge Plan: Skilled Nursing Facility Barriers to Discharge: Continued Medical Work up, Ship broker, Other (must enter comment) (COVID isolation)   Patient Goals and CMS Choice   CMS Medicare.gov Compare Post Acute Care list provided to:: Other (Comment Required) (Told daughter how to access online)    Expected Discharge Plan and Services Expected Discharge Plan: [place] Acute Care Choice: [place] Living arrangements for the past 2 months: Single Family Home                                      Prior Living Arrangements/Services Living arrangements for the past 2 months: Single Family Home Lives with:: Spouse Patient language and need for interpreter reviewed:: Yes Do you feel safe going back to the place where you live?: Yes      Need for Family Participation in Patient Care: Yes (Comment) Care giver support system in place?: Yes (comment)   Criminal Activity/Legal Involvement Pertinent to Current Situation/Hospitalization: No - Comment as  needed  Activities of Daily Living Home Assistive Devices/Equipment: Walker (specify type) ADL Screening (condition at time of admission) Patient's cognitive ability adequate to safely complete daily activities?: Yes Is the patient deaf or have difficulty hearing?: No Does the patient have difficulty seeing, even when wearing glasses/contacts?: No Does the patient have difficulty concentrating, remembering, or making decisions?: No Patient able to express need for assistance with ADLs?: Yes Does the patient have difficulty dressing or bathing?: Yes Independently performs ADLs?: No Communication: Independent Dressing (OT): Independent Grooming: Independent Feeding: Independent Bathing: Needs assistance Is this a change from baseline?: Change from baseline, expected to last <3 days Toileting: Needs assistance Is this a change from baseline?: Change from baseline, expected to last <3 days In/Out Bed: Needs assistance Is this a change from baseline?: Change from baseline, expected to last >3 days Walks in Home: Needs assistance Is this a change from baseline?: Change from baseline, expected to last >3 days Does the patient have difficulty walking or climbing stairs?: Yes Weakness of Legs: Both Weakness of Arms/Hands: None  Permission Sought/Granted Permission sought to share information with : Facility Sport and exercise psychologist, Family Supports    Share Information with NAME: [name] Marshell Levan  Permission granted to share info w AGENCY: SNF's  Permission granted to share info w Relationship: Daughter  Permission granted to share info w Contact Information: [phone]  Emotional Assessment       Orientation: : Oriented to Self, Oriented to Place, Oriented to Situation Alcohol / Substance Use: Not Applicable Psych  Involvement: No (comment)  Admission diagnosis:  Shortness of breath [R06.02] Hyponatremia [E87.1] SOB (shortness of breath) [R06.02] Acute on chronic diastolic congestive  heart failure (HCC) [I50.33] Acute respiratory failure with hypoxia ([place]) [J96.01] AKI (acute kidney injury) ([place]) [N17.9] Patient Active Problem List   Diagnosis Date Noted   SOB (shortness of breath) 12/14/2021   Acute pulmonary embolism ([place]) 12/14/2021   Shortness of breath 12/13/2021   Pneumonia due to COVID-19 virus 12/13/2021   Hyperkalemia 12/13/2021   Acute hypoxemic respiratory failure due to COVID-19 ([place]) 12/13/2021   Hyponatremia 12/13/2021   At risk for polypharmacy 12/13/2021   Acute on chronic congestive heart failure (HCC)    Acute on chronic diastolic heart failure ([place]) 12/09/2021   New onset atrial fibrillation ([place]) 11/16/2021   Acute anemia 11/16/2021   Acute respiratory failure with hypoxia ([place]) 11/16/2021   Elevated temperature 11/16/2021   Aortic atherosclerosis ([place]) 08/02/2020   Fall 12/29/2019   Weakness 06/09/2019   Wheezing 04/16/2019   Back skin lesion 12/04/2018   Restless legs syndrome 11/25/2018   PAD (peripheral artery disease) ([place]) 04/07/2018   Cold foot 05/15/2017   Constipation 08/15/2016   AKI (acute kidney injury) ([place]) 04/13/2016   Small bowel obstruction ([place]) 11/24/2015   Swelling of left lower extremity 07/16/2015   Acute Ileitis 06/12/2015   Hypokalemia 06/12/2015   Accelerated hypertension 06/12/2015   Hereditary hemochromatosis ([place]) 05/25/2015   Open leg wound 05/19/2015   Lower extremity edema 05/19/2015   Iron excess 04/16/2015   Dizziness 10/26/2014   Hyperbilirubinemia 10/26/2014   Health care maintenance 05/18/2014   Anemia, iron deficiency 04/21/2014   Hospital discharge follow-up 04/14/2014   Hot flashes 03/07/2014   Face lesion 11/03/2013   Numbness in feet 10/08/2012   Nocturia 10/08/2012   Essential hypertension 12/18/2011   Hypercholesteremia 12/18/2011   History of colon cancer 12/18/2011   Barrett's esophagus 12/18/2011   Chronic back pain 12/18/2011   Osteopenia 12/18/2011   PCP:  [name],  MD Pharmacy:   [facility] - [place], [place] [address] Phone: [phone] Fax: [phone]  [facility] [address], [place] - [address] [facility] [address] [place] [address] Phone: [phone] Fax: [phone]     Social Determinants of Health (SDOH) Interventions    Readmission Risk Interventions     No data to display

## 2021-12-17 NOTE — Evaluation (Signed)
Occupational Therapy Evaluation Patient Details Name: [name] MRN: [id number] DOB: 10-27-1928 Today's Date: 12/17/2021   History of Present Illness 88yo F pt has been admitted 2 times in the last few weeks, most recently after sudden severe weakness at home and fall to the floor while AMB in house. Found to also be Covid + on this admission, requiring supplemental O2 here. PMH: dCHF, HTN, PAD, colon cancer s/p resection x2, back pain. At baseline pt uses a 4WW for limited household distances of 40-60', requires physical assist for all transfers.   Clinical Impression   Ms Valera was seen for OT evaluation this date. Prior to hospital admission, pt was requiring assist from family for LBD and mobility ~10 ft. Pt lives with spouse, daughter assist daily. Pt presents to acute OT demonstrating impaired ADL performance and functional mobility 2/2 decreased activity tolerance and functional strength/ROM/balance deficits. Pt currently requires Pt currently requires MOD A exit bed, tolerates sitting for grooming tasks (daughter assists). MAX A x2 doff underwear in standing. MAX A x2 + RW bed>chair SPT. Pt would benefit from skilled OT to address noted impairments and functional limitations (see below for any additional details). Upon hospital discharge, recommend STR to maximize pt safety and return to PLOF.    Recommendations for follow up therapy are one component of a multi-disciplinary discharge planning process, led by the attending physician.  Recommendations may be updated based on patient status, additional functional criteria and insurance authorization.   Follow Up Recommendations  Skilled nursing-short term rehab (<3 hours/day)    Assistance Recommended at Discharge Frequent or constant Supervision/Assistance  Patient can return home with the following Two people to help with walking and/or transfers;A lot of help with bathing/dressing/bathroom;Assistance with cooking/housework;Assist for  transportation;Help with stairs or ramp for entrance;Direct supervision/assist for medications management;Direct supervision/assist for financial management    Functional Status Assessment  Patient has had a recent decline in their functional status and/or demonstrates limited ability to make significant improvements in function in a reasonable and predictable amount of time  Equipment Recommendations  None recommended by OT    Recommendations for Other Services       Precautions / Restrictions Precautions Precautions: Fall Restrictions Weight Bearing Restrictions: No      Mobility Bed Mobility Overal bed mobility: Needs Assistance Bed Mobility: Supine to Sit     Supine to sit: Mod assist          Transfers Overall transfer level: Needs assistance Equipment used: Rolling walker (2 wheels) Transfers: Sit to/from Stand, Bed to chair/wheelchair/BSC Sit to Stand: Max assist Stand pivot transfers: Max assist, +2 physical assistance                Balance Overall balance assessment: Needs assistance Sitting-balance support: Single extremity supported, Feet supported, Bilateral upper extremity supported Sitting balance-Leahy Scale: Fair     Standing balance support: Bilateral upper extremity supported, Reliant on assistive device for balance Standing balance-Leahy Scale: Poor                             ADL either performed or assessed with clinical judgement   ADL Overall ADL's : Needs assistance/impaired                                       General ADL Comments: Pt currently requires MAX A x2 doff underwear  in standing. MAX A x2 + RW ADL t/f      Pertinent Vitals/Pain Pain Assessment Pain Assessment: Faces Faces Pain Scale: Hurts little more Pain Location: genreal soreness Pain Descriptors / Indicators: Aching Pain Intervention(s): Limited activity within patient's tolerance, Repositioned     Hand Dominance      Extremity/Trunk Assessment Upper Extremity Assessment Upper Extremity Assessment: Generalized weakness   Lower Extremity Assessment Lower Extremity Assessment: Generalized weakness       Communication Communication Communication: HOH   Cognition Arousal/Alertness: Awake/alert Behavior During Therapy: WFL for tasks assessed/performed Overall Cognitive Status: History of cognitive impairments - at baseline                                                  Home Living Family/patient expects to be discharged to:: Private residence Living Arrangements: Spouse/significant other Available Help at Discharge: Family;Available 24 hours/day;Available PRN/intermittently Type of Home: House Home Access: Stairs to enter;Ramped entrance Entrance Stairs-Number of Steps: 1   Home Layout: One level     Bathroom Shower/Tub: Occupational psychologist: Handicapped height     Home Equipment: Rollator (4 wheels);Shower seat;Hand held shower head;Other (comment)   Additional Comments: Pt sleeps in a lift recliner and spends majority of her time in lift recliner, uses it to stand her up      Prior Functioning/Environment Prior Level of Function : Needs assist;History of Falls (last six months)             Mobility Comments: Pt requires lift recliner and/or family to assist with transfers, using rollator for walking short distances (was previously able to walk 50-60' at a time but recently has been <10 ft with family assist). Per family, a w/c will not fit through doorways at home. ADLs Comments: Pt requires assist for LB dressing, bathing from seated position, meals, housekeeping, driving, and med mgt. Daughters come nearly daily to provide meals and assist with housekeeping tasks and transportation. Great family support available.        OT Problem List: Decreased strength;Decreased activity tolerance;Decreased knowledge of use of DME or AE;Impaired balance  (sitting and/or standing)      OT Treatment/Interventions: Self-care/ADL training;Therapeutic exercise;Therapeutic activities;Patient/family education;DME and/or AE instruction;Balance training;Energy conservation    OT Goals(Current goals can be found in the care plan section) Acute Rehab OT Goals Patient Stated Goal: to go home OT Goal Formulation: With patient/family Time For Goal Achievement: 12/31/21 Potential to Achieve Goals: Good ADL Goals Pt Will Perform Grooming: with set-up;with supervision;sitting Pt Will Perform Lower Body Dressing: with mod assist;sitting/lateral leans Pt Will Transfer to Toilet: with mod assist;stand pivot transfer;bedside commode  OT Frequency: Min 2X/week    Co-evaluation              AM-PAC OT "6 Clicks" Daily Activity     Outcome Measure Help from another person eating meals?: None Help from another person taking care of personal grooming?: A Little Help from another person toileting, which includes using toliet, bedpan, or urinal?: A Lot Help from another person bathing (including washing, rinsing, drying)?: A Lot Help from another person to put on and taking off regular upper body clothing?: A Little Help from another person to put on and taking off regular lower body clothing?: A Lot 6 Click Score: 16   End of Session Nurse Communication: Mobility status  Activity Tolerance: Patient tolerated treatment well Patient left: in chair;with call bell/phone within reach;with family/visitor present  OT Visit Diagnosis: Other abnormalities of gait and mobility (R26.89);History of falling (Z91.81);Muscle weakness (generalized) (M62.81)                Time: 5015-8682 OT Time Calculation (min): 26 min Charges:  OT General Charges $OT Visit: 1 Visit OT Evaluation $OT Eval Moderate Complexity: 1 Mod OT Treatments $Self Care/Home Management : 8-22 mins  Dessie Coma, M.S. OTR/L  12/17/21, 1:41 PM  ascom [phone]

## 2021-12-17 NOTE — Progress Notes (Addendum)
Physical Therapy Treatment Patient Details Name: [name] MRN: [id number] DOB: Jun 04, 1928 Today's Date: 12/17/2021   History of Present Illness 88yo F pt has been admitted 2 times in the last few weeks, most recently after sudden severe weakness at home and fall to the floor while AMB in house. Found to also be Covid + on this admission, requiring supplemental O2 here. PMH: dCHF, HTN, PAD, colon cancer s/p resection x2, back pain. At baseline pt uses a 4WW for limited household distances of 40-60', requires physical assist for all transfers.    PT Comments    Pt recently back to bed, agreeable to PT session. Her Left knee continues to feel improved since steroid injection last week. Pt partakes in exercises in bed for legs, as well as a modified situp for ABD engagement. Good tolerance overall. Pt weaned from 2 -1L in session, sats at 95% or greater throughout. Pt reports continued weakness and generally poor feeling compared to baseline.      Recommendations for follow up therapy are one component of a multi-disciplinary discharge planning process, led by the attending physician.  Recommendations may be updated based on patient status, additional functional criteria and insurance authorization.  Follow Up Recommendations  Skilled nursing-short term rehab (<3 hours/day) Can patient physically be transported by private vehicle: No   Assistance Recommended at Discharge Frequent or constant Supervision/Assistance  Patient can return home with the following Two people to help with walking and/or transfers;A lot of help with bathing/dressing/bathroom;Assist for transportation;Help with stairs or ramp for entrance;Assistance with cooking/housework   Equipment Recommendations       Recommendations for Other Services       Precautions / Restrictions Precautions Precautions: Fall Restrictions Weight Bearing Restrictions: No     Mobility  Bed Mobility Overal bed mobility:  (just got  back in bed prior to entry) Bed Mobility: Supine to Sit (short sitting to upright short sitting back unsupported 1x10)                Transfers                        Ambulation/Gait                   Stairs             Wheelchair Mobility    Modified Rankin (Stroke Patients Only)       Balance                                            Cognition Arousal/Alertness: Awake/alert Behavior During Therapy: WFL for tasks assessed/performed Overall Cognitive Status: History of cognitive impairments - at baseline                                          Exercises Other Exercises Other Exercises: Hamstrings set 1x10 bilat into pillow Other Exercises: SAQ 1x15 bilat Other Exercises: heel slides 1x15 bilat; resisted hip/knee extensin 1x15 bilat Other Exercises: modified situp 1x10    General Comments        Pertinent Vitals/Pain Pain Assessment Pain Assessment: No/denies pain    Home Living Family/patient expects to be discharged to:: Private residence Living Arrangements: Spouse/significant other Available Help at Discharge: Family;Available 24  hours/day;Available PRN/intermittently Type of Home: House Home Access: Stairs to enter;Ramped entrance   Entrance Stairs-Number of Steps: 1   Home Layout: One level Home Equipment: Rollator (4 wheels);Shower seat;Hand held shower head;Other (comment) Additional Comments: Pt sleeps in a lift recliner and spends majority of her time in lift recliner, uses it to stand her up    Prior Function            PT Goals (current goals can now be found in the care plan section) Acute Rehab PT Goals Patient Stated Goal: regain ability to perform household AMB PT Goal Formulation: With patient Time For Goal Achievement: 12/29/21 Potential to Achieve Goals: Fair Progress towards PT goals: Progressing toward goals    Frequency    Min 2X/week      PT Plan  Current plan remains appropriate    Co-evaluation              AM-PAC PT "6 Clicks" Mobility   Outcome Measure  Help needed turning from your back to your side while in a flat bed without using bedrails?: A Lot Help needed moving from lying on your back to sitting on the side of a flat bed without using bedrails?: Total Help needed moving to and from a bed to a chair (including a wheelchair)?: Total Help needed standing up from a chair using your arms (e.g., wheelchair or bedside chair)?: Total Help needed to walk in hospital room?: Total Help needed climbing 3-5 steps with a railing? : Total 6 Click Score: 7    End of Session Equipment Utilized During Treatment: Oxygen Activity Tolerance: Patient tolerated treatment well;Patient limited by fatigue Patient left: in bed;with bed alarm set Nurse Communication: Mobility status PT Visit Diagnosis: Unsteadiness on feet (R26.81);Difficulty in walking, not elsewhere classified (R26.2);Muscle weakness (generalized) (M62.81)     Time: 3614-4315 PT Time Calculation (min) (ACUTE ONLY): 23 min  Charges:  $Therapeutic Activity: 23-37 mins                    4:39 PM, 12/17/21 Etta Grandchild, PT, DPT Physical Therapist - [facility]  [phone] ([place])    Thelbert Gartin C 12/17/2021, 4:37 PM

## 2021-12-17 NOTE — Progress Notes (Signed)
Progress Note   Patient: [name] IRJ:[id number] DOB: 09-Mar-1928 DOA: 12/13/2021     3 DOS: the patient was seen and examined on 12/17/2021   Brief hospital course: [name] is a 88 year old female with history of hypertension, neuropathy, hyperlipidemia, back spasms, osteoarthritis, who presents to the emergency department for chief concerns of shortness of breath.  Initial vitals in the emergency department showed temperature of 97.8, respiration rate of 20, heart rate of 63, blood pressure 132/55, SPO2 of 86% on room air and improved to 100% on 3 L nasal cannula.  Serum sodium is 124, potassium 5.5, chloride 91, bicarb 22, BUN of 48, serum creatinine of 2.09, GFR 22, nonfasting blood glucose 112, WBC 8.9, hemoglobin 10.5, platelets of 301.  Presently troponin is 17.  BNP is 228.6.  D-dimer elevated at 2.63.  COVID/influenza A/influenza B PCR positive for COVID.  EDP ordered a VQ scan that is pending and bilateral ultrasound of the lower extremities to assess for DVT.  ED treatment: Furosemide 40 mg IV one-time dose, sodium chloride 1 g tablet.  10/31: Sodium improved to 130.  Creatinine at 1.64.  Lower extremity venous Doppler was negative for DVT but VQ scan with high probability of PE. Patient was started on molnupiravir and Xarelto. History of GI bleed on Eliquis, husband was reluctant to restart Eliquis.  Agrees to give Xarelto a trial.  Patient is high risk for deterioration and mortality based on advanced age, underlying comorbidities and recent PE with COVID-19 pneumonia. Palliative care consult.  11/1: Patient was started on Xarelto yesterday.  Per family she appears little more lethargic and confused today, they were concerned that it is secondary to Xarelto, although less likely. Patient was able to answer questions appropriately with me.  Appears lethargic.  Multiple underlying comorbidities which are contributory at this stage.  Awaiting palliative care  consult.  11/2: Hemodynamically stable.  No current concern of abnormal bleeding.  Remained on 2 L of oxygen.  Daughter was also interested for SNF, PT/OT ordered.  Mentation at baseline. Palliative care consult pending.  11/3: Remained hemodynamically stable.  PT is recommending SNF.  TOC is aware. Palliative care is recommending outpatient palliative follow-up.   Assessment and Plan: * Acute hypoxemic respiratory failure due to COVID-19 [facility]) -Start her on molnupiravir. - Solu-Medrol IV with tapering to p.o. prednisone - Symptomatic support: Robitussin-DM and Tussionex as needed for cough ordered - Airborne and contact precautions -Supplemental oxygen-wean as tolerated  Pneumonia due to COVID-19 virus Procalcitonin remained negative, worsening leukocytosis most likely secondary to steroid -Molnupiravir - Steroid - Oxygen supplementation  Acute pulmonary embolism (HCC) VQ scan with high probability of PE.  Lower extremity venous Doppler was negative for DVT. Patient has an history of GI bleed with Eliquis, husband little reluctant to restart but agrees to try Xarelto. -Started on Xarelto last night. -Continue Xarelto and monitor  Shortness of breath - Multifactorial including heart failure exacerbation in setting of COVID-19 infection, COVID-19 pneumonia and PE.  Hyponatremia Improving, sodium at 133  With COVID-19 infection and elevated BNP, avoiding IV fluid at this time.  Low urine osmolality and urinary sodium of 18. - Sodium chloride 1 g p.o. ordered by EDP -Home Lasix was restarted -Monitor sodium  AKI (acute kidney injury) ([place]) Some improvement of creatinine, 164>>1.34>1.12 - Baseline serum creatinine is 1.12-1.22/GFR 42-47 - Etiology is multifactorial including cardiorenal versus intrarenal in setting of COVID-19 infection -Monitor renal function -Avoid nephrotoxins  Hyperkalemia Resolved. - Presumed secondary to acute  kidney injury  Hypercholesteremia -  Simvastatin 10 mg daily resumed  Restless legs syndrome - Gabapentin 200 mg nightly  At risk for polypharmacy - Resumed home tramadol 50 mg nightly.  For moderate pain - Ordered oxycodone 5 mg p.o. 3 times daily as needed for severe pain - Patient takes gabapentin 200 mg nightly -Patient currently not requiring any tramadol or oxycodone  Essential hypertension Blood pressure within goal. -Continuing home amlodipine -Restart home Lasix  -Keep holding irbesartan   Subjective: Patient was seen and examined today.  No new concerns.  Daughter at bedside.  Family is interested in SNF  Physical Exam: Vitals:   12/16/21 2341 12/17/21 0340 12/17/21 0744 12/17/21 1114  BP: (!) 146/68 (!) 155/65 (!) 149/54 (!) 141/57  Pulse: 67  66 (!) 57  Resp: '20 20 19 18  '$ Temp: 98.3 F (36.8 C) 98.8 F (37.1 C) 98.4 F (36.9 C) 98.3 F (36.8 C)  TempSrc: Oral     SpO2: 95%  92% 97%  Weight:       General.  Frail elderly lady, in no acute distress. Pulmonary.  Lungs clear bilaterally, normal respiratory effort. CV.  Regular rate and rhythm, no JVD, rub or murmur. Abdomen.  Soft, nontender, nondistended, BS positive. CNS.  Alert and oriented .  No focal neurologic deficit. Extremities.  No edema, no cyanosis, pulses intact and symmetrical. Psychiatry.  Judgment and insight appears normal.   Data Reviewed: Prior data reviewed  Family Communication: Discussed with daughter at bedside  Disposition: Status is: Inpatient Remains inpatient appropriate because: Severity of illness   Planned Discharge Destination: Home with Home Health  DVT prophylaxis-Xarelto Time spent: 38 minutes  This record has been created using Systems analyst. Errors have been sought and corrected,but may not always be located. Such creation errors do not reflect on the standard of care.  Author: Lorella Nimrod, MD 12/17/2021 4:23 PM  For on call review [email or web address].

## 2021-12-17 NOTE — Consult Note (Signed)
Consultation Note Date: 12/17/2021   Patient Name: [name]  DOB: Apr 20, 1928  MRN: [id number]  Age / Sex: 88 y.o., female  PCP: [name], MD Referring Physician: Lorella Nimrod, MD  Reason for Consultation: Establishing goals of care  HPI/Patient Profile: : [name] is a 88 year old female with history of hypertension, neuropathy, hyperlipidemia, back spasms, osteoarthritis, who presents to the emergency department for chief concerns of shortness of breath.   Clinical Assessment and Goals of Care: Notes and labs reviewed.  In to see patient who was just finished getting into bedside chair with PT/OT.  Daughter is at bedside.  Patient does not really speak during my visit.  Daughter discusses patient's status and decline over the past few months.  We discussed her current status.   We discussed her diagnosis, prognosis, and GOC.  Created space and opportunity for patient  to explore thoughts and feelings regarding current medical information.   A detailed discussion was had today regarding advanced directives. Values and goals of care important to patient and family were attempted to be elicited.  Patient's husband and son and her conversation.  Daughter discusses that there are taking 1 day at a time.  They discussed that she has good days and bad days at baseline.  They seem to understand her poor overall prognosis, but at this time would like for patient to go to skilled nursing.  They confirmed DNR status, but would want all other care.    SUMMARY OF RECOMMENDATIONS   Would recommend outpatient palliative to follow.  Prognosis:  Poor overall      Primary Diagnoses: Present on Admission:  Shortness of breath  Pneumonia due to COVID-19 virus  Restless legs syndrome  Hypercholesteremia  AKI (acute kidney injury) ([place])  Hyperkalemia  Acute hypoxemic respiratory failure due  to COVID-19 [facility])  Hyponatremia  Acute pulmonary embolism ([place])  Essential hypertension   I have reviewed the medical record, interviewed the patient and family, and examined the patient. The following aspects are pertinent.  Past Medical History:  Diagnosis Date   Anemia    Barrett's esophagus    BCC (basal cell carcinoma of skin) 03/07/2013   vertex scalp - NODULAR NODULAR PATTERN PATTERN   Bowel obstruction (HCC)    s/p adhesion resection   CHF (congestive heart failure) (HCC)    Chronic back pain    Colon cancer ([place]) 1988 and 1989   adenocarcinoma, s/p resection x  2 and chemo   Diverticulitis    GERD (gastroesophageal reflux disease)    H/O open leg wound    Hiatal hernia    Hypercholesteremia    Hypertension    Lumbar scoliosis    OA (osteoarthritis)    Peripheral neuropathy    Recurrent sinus infections    Renal cyst    S/P chemotherapy, time since greater than 12 weeks    colon cancer   Vertigo    Social History   Socioeconomic History   Marital status: Married    Spouse name: Not  on file   Number of children: 4   Years of education: 12th grade   Highest education level: Not on file  Occupational History   Occupation: homemaker  Tobacco Use   Smoking status: Never   Smokeless tobacco: Never  Vaping Use   Vaping Use: Never used  Substance and Sexual Activity   Alcohol use: No    Alcohol/week: 0.0 standard drinks of alcohol   Drug use: No   Sexual activity: Not Currently  Other Topics Concern   Not on file  Social History Narrative   Not on file   Social Determinants of Health   Financial Resource Strain: Low Risk  (11/30/2020)   Overall Financial Resource Strain (CARDIA)    Difficulty of Paying Living Expenses: Not hard at all  Food Insecurity: No Food Insecurity (12/15/2021)   Hunger Vital Sign    Worried About Running Out of Food in the Last Year: Never true    Ran Out of Food in the Last Year: Never true  Transportation Needs: No  Transportation Needs (12/15/2021)   PRAPARE - Hydrologist (Medical): No    Lack of Transportation (Non-Medical): No  Physical Activity: Unknown (11/30/2020)   Exercise Vital Sign    Days of Exercise per Week: 0 days    Minutes of Exercise per Session: Not on file  Stress: No Stress Concern Present (11/30/2020)   [place]    Feeling of Stress : Not at all  Social Connections: Unknown (11/30/2020)   Social Connection and Isolation Panel [NHANES]    Frequency of Communication with Friends and Family: More than three times a week    Frequency of Social Gatherings with Friends and Family: More than three times a week    Attends Religious Services: Not on Advertising copywriter or Organizations: Not on file    Attends Archivist Meetings: Not on file    Marital Status: Married   Family History  Problem Relation Age of Onset   Breast cancer Mother    Asthma Mother    Stroke Father    Hypertension Father    Diabetes Father    Ovarian cancer Sister        x2   Prostate cancer Brother    Hypertension Brother        x3   Heart disease Brother    Hypercholesterolemia Brother        x3   Diabetes Brother    Spina bifida Grandchild    Hematuria Neg Hx    Kidney cancer Neg Hx    Kidney disease Neg Hx    Sickle cell trait Neg Hx    Tuberculosis Neg Hx    Scheduled Meds:  (feeding supplement) PROSource Plus  30 mL Oral TID BM   amLODipine  5 mg Oral Daily   feeding supplement  1 Container Oral TID BM   furosemide  20 mg Oral Daily   gabapentin  200 mg Oral QHS   metoCLOPramide  5 mg Oral BID   metoprolol succinate  25 mg Oral BID   molnupiravir EUA  4 capsule Oral BID   multivitamin with minerals  1 tablet Oral Daily   pantoprazole  40 mg Oral BID   predniSONE  50 mg Oral Daily   Rivaroxaban  15 mg Oral BID WC   Followed by   Derrill Memo ON 01/04/2022] rivaroxaban  20  mg  Oral Q supper   simvastatin  10 mg Oral q1800   Continuous Infusions: PRN Meds:.acetaminophen **OR** acetaminophen, albuterol, chlorpheniramine-HYDROcodone, guaiFENesin-dextromethorphan, ondansetron **OR** ondansetron (ZOFRAN) IV, oxyCODONE, senna-docusate, traMADol Medications Prior to Admission:  Prior to Admission medications   Medication Sig Start Date End Date Taking? Authorizing Provider  acetaminophen (TYLENOL) 500 MG tablet Take 500 mg by mouth every 6 (six) hours as needed.   Yes [provider]  albuterol (VENTOLIN HFA) 108 (90 Base) MCG/ACT inhaler TAKE 2 PUFFS BY MOUTH EVERY 6 HOURS AS NEEDED FOR WHEEZE OR SHORTNESS OF BREATH 11/12/21  Yes [name], MD  amLODipine (NORVASC) 5 MG tablet TAKE 1 TABLET EVERY DAY 11/29/21  Yes [name], MD  calcium carbonate (OSCAL) 1500 (600 Ca) MG TABS tablet Take 600 mg of elemental calcium by mouth 2 (two) times daily with a meal.   Yes [provider]  Cholecalciferol (VITAMIN D3) 50 MCG (2000 UT) capsule Take 2,000 Units by mouth daily.   Yes [provider]  diclofenac Sodium (VOLTAREN) 1 % GEL Apply 2 g topically 3 (three) times daily. 11/20/21  Yes Fritzi Mandes, MD  docusate sodium (COLACE) 100 MG capsule Take 100 mg by mouth 2 (two) times daily.   Yes [provider]  fexofenadine (ALLEGRA) 60 MG tablet TAKE 1 TABLET EVERY DAY 08/29/21  Yes Dutch Quint B, FNP  furosemide (LASIX) 20 MG tablet Take 1 tablet (20 mg total) by mouth daily. Patient taking differently: Take 40 mg by mouth daily. 12/11/21 01/10/22 Yes Max Sane, MD  gabapentin (NEURONTIN) 100 MG capsule TAKE 2 CAPSULES AT BEDTIME 05/18/20  Yes Scott, Randell Patient, MD  irbesartan (AVAPRO) 300 MG tablet TAKE 1 TABLET EVERY DAY 11/22/21  Yes [name], MD  metoCLOPramide (REGLAN) 5 MG tablet Take 5 mg by mouth 2 (two) times daily. 11/29/21  Yes [provider]  metoprolol succinate (TOPROL-XL) 25 MG 24 hr tablet TAKE 1 TABLET  TWICE DAILY 04/27/21  Yes [name], MD  ondansetron (ZOFRAN ODT) 4 MG disintegrating tablet Take 1 tablet (4 mg total) by mouth 2 (two) times daily as needed for nausea or vomiting. 10/29/19  Yes [name], MD  oxyCODONE (OXY IR/ROXICODONE) 5 MG immediate release tablet Take 5 mg by mouth 3 (three) times daily.    Yes [provider]  pantoprazole (PROTONIX) 40 MG tablet Take 40 mg by mouth 2 (two) times daily. 11/29/21  Yes [provider]  simvastatin (ZOCOR) 10 MG tablet TAKE 1 TABLET (10 MG TOTAL) AT BEDTIME. 02/23/21  Yes [name], MD  Spacer/Aero-Holding Chambers ([place]) [place]  06/26/19  Yes [provider]  traMADol (ULTRAM) 50 MG tablet Take 1 tablet by mouth at bedtime as needed. 07/07/20  Yes [provider]   Allergies  Allergen Reactions   Fentanyl Other (See Comments)    confusion   Review of Systems  Constitutional:  Positive for fatigue.    Physical Exam Pulmonary:     Effort: Pulmonary effort is normal.  Neurological:     Mental Status: She is alert.     Vital Signs: BP (!) 141/57 (BP Location: Left Arm)   Pulse (!) 57   Temp 98.3 F (36.8 C)   Resp 18   Wt 72.3 kg   SpO2 97%   BMI 28.24 kg/m  Pain Scale: 0-10   Pain Score: 0-No pain   SpO2: SpO2: 97 % O2 Device:SpO2: 97 % O2 Flow Rate: .O2 Flow Rate (L/min): 2 L/min  IO: Intake/output  summary:  Intake/Output Summary (Last 24 hours) at 12/17/2021 1328 Last data filed at 12/16/2021 1656 Gross per 24 hour  Intake --  Output 350 ml  Net -350 ml    LBM: Last BM Date : 12/15/21 Baseline Weight: Weight: 71.2 kg Most recent weight: Weight: 72.3 kg       Signed by: Asencion Gowda, NP   Please contact Palliative Medicine Team phone at [phone] for questions and concerns.  For individual provider: See Shea Evans

## 2021-12-18 DIAGNOSIS — U071 COVID-19: Secondary | ICD-10-CM | POA: Diagnosis not present

## 2021-12-18 DIAGNOSIS — J9601 Acute respiratory failure with hypoxia: Secondary | ICD-10-CM | POA: Diagnosis not present

## 2021-12-18 MED ORDER — FUROSEMIDE 10 MG/ML IJ SOLN
40.0000 mg | Freq: Once | INTRAMUSCULAR | Status: AC
  Administered 2021-12-18: 40 mg via INTRAVENOUS
  Filled 2021-12-18: qty 4

## 2021-12-18 NOTE — Progress Notes (Signed)
Progress Note   Patient: [name] VCB:[id number] DOB: 1928-11-14 DOA: 12/13/2021     4 DOS: the patient was seen and examined on 12/18/2021   Brief hospital course: [name] is a 88 year old female with history of hypertension, neuropathy, hyperlipidemia, back spasms, osteoarthritis, who presents to the emergency department for chief concerns of shortness of breath.  Initial vitals in the emergency department showed temperature of 97.8, respiration rate of 20, heart rate of 63, blood pressure 132/55, SPO2 of 86% on room air and improved to 100% on 3 L nasal cannula.  Serum sodium is 124, potassium 5.5, chloride 91, bicarb 22, BUN of 48, serum creatinine of 2.09, GFR 22, nonfasting blood glucose 112, WBC 8.9, hemoglobin 10.5, platelets of 301.  Presently troponin is 17.  BNP is 228.6.  D-dimer elevated at 2.63.  COVID/influenza A/influenza B PCR positive for COVID.  EDP ordered a VQ scan that is pending and bilateral ultrasound of the lower extremities to assess for DVT.  ED treatment: Furosemide 40 mg IV one-time dose, sodium chloride 1 g tablet.  10/31: Sodium improved to 130.  Creatinine at 1.64.  Lower extremity venous Doppler was negative for DVT but VQ scan with high probability of PE. Patient was started on molnupiravir and Xarelto. History of GI bleed on Eliquis, husband was reluctant to restart Eliquis.  Agrees to give Xarelto a trial.  Patient is high risk for deterioration and mortality based on advanced age, underlying comorbidities and recent PE with COVID-19 pneumonia. Palliative care consult.  11/1: Patient was started on Xarelto yesterday.  Per family she appears little more lethargic and confused today, they were concerned that it is secondary to Xarelto, although less likely. Patient was able to answer questions appropriately with me.  Appears lethargic.  Multiple underlying comorbidities which are contributory at this stage.  Awaiting palliative care  consult.  11/2: Hemodynamically stable.  No current concern of abnormal bleeding.  Remained on 2 L of oxygen.  Daughter was also interested for SNF, PT/OT ordered.  Mentation at baseline. Palliative care consult pending.  11/3: Remained hemodynamically stable.  PT is recommending SNF.  TOC is aware. Palliative care is recommending outpatient palliative follow-up.  11/4: Patient remained stable, saturating well on room air now.  Awaiting disposition to SNF.   Assessment and Plan: * Acute hypoxemic respiratory failure due to COVID-19 [facility]) -Start her on molnupiravir. - Solu-Medrol IV with tapering to p.o. prednisone - Symptomatic support: Robitussin-DM and Tussionex as needed for cough ordered - Airborne and contact precautions -Supplemental oxygen-wean as tolerated  Pneumonia due to COVID-19 virus Procalcitonin remained negative, worsening leukocytosis most likely secondary to steroid -Molnupiravir - Steroid - Oxygen supplementation  Acute pulmonary embolism (HCC) VQ scan with high probability of PE.  Lower extremity venous Doppler was negative for DVT. Patient has an history of GI bleed with Eliquis, husband little reluctant to restart but agrees to try Xarelto. -Started on Xarelto last night. -Continue Xarelto and monitor  Shortness of breath - Multifactorial including heart failure exacerbation in setting of COVID-19 infection, COVID-19 pneumonia and PE.  Hyponatremia Improving, sodium at 133  With COVID-19 infection and elevated BNP, avoiding IV fluid at this time.  Low urine osmolality and urinary sodium of 18. - Sodium chloride 1 g p.o. ordered by EDP -Home Lasix was restarted -Monitor sodium  AKI (acute kidney injury) ([place]) Some improvement of creatinine, 164>>1.34>1.12 - Baseline serum creatinine is 1.12-1.22/GFR 42-47 - Etiology is multifactorial including cardiorenal versus intrarenal in setting  of COVID-19 infection -Monitor renal function -Avoid  nephrotoxins  Hyperkalemia Resolved. - Presumed secondary to acute kidney injury  Hypercholesteremia - Simvastatin 10 mg daily resumed  Restless legs syndrome - Gabapentin 200 mg nightly  At risk for polypharmacy - Resumed home tramadol 50 mg nightly.  For moderate pain - Ordered oxycodone 5 mg p.o. 3 times daily as needed for severe pain - Patient takes gabapentin 200 mg nightly -Patient currently not requiring any tramadol or oxycodone  Essential hypertension Blood pressure within goal. -Continuing home amlodipine -Restart home Lasix  -Keep holding irbesartan   Subjective: Patient was seen and examined today.  No new concerns.  Daughter at bedside.  Family is interested in SNF  Physical Exam: Vitals:   12/17/21 1706 12/17/21 2005 12/18/21 0600 12/18/21 0823  BP: (!) 159/64 (!) 141/59 136/61 (!) 156/64  Pulse: 74 68 62 71  Resp: '17 16 16 17  '$ Temp: 98.2 F (36.8 C) 98.2 F (36.8 C) 98.1 F (36.7 C) 98.4 F (36.9 C)  TempSrc:  Oral Oral Oral  SpO2: 100% 100% 96% 94%  Weight:       General.     In no acute distress. Pulmonary.  Lungs clear bilaterally, normal respiratory effort. CV.  Regular rate and rhythm, no JVD, rub or murmur. Abdomen.  Soft, nontender, nondistended, BS positive. CNS.  Alert and oriented .  No focal neurologic deficit. Extremities.  No edema, no cyanosis, pulses intact and symmetrical. Psychiatry.  Judgment and insight appears normal.   Data Reviewed: Prior data reviewed  Family Communication: Discussed with husband and multiple other family members at bedside  Disposition: Status is: Inpatient Remains inpatient appropriate because: Severity of illness   Planned Discharge Destination: Home with Home Health  DVT prophylaxis-Xarelto Time spent: 39 minutes  This record has been created using Systems analyst. Errors have been sought and corrected,but may not always be located. Such creation errors do not reflect on the  standard of care.  Author: Lorella Nimrod, MD 12/18/2021 3:23 PM  For on call review [email or web address].

## 2021-12-18 NOTE — TOC Progression Note (Signed)
Transition of Care [facility]) - Progression Note    Patient Details  Name: [name] MRN: [id number] Date of Birth: 20-Jul-1928  Transition of Care [facility]) CM/SW [place], LCSW Phone Number: 12/18/2021, 9:43 AM  Clinical Narrative:    Patient has bed offers at [facility], [facility], and [facility]. [place] is pending. CSW called and spoke to patient's daughter [name]. She requested list of bed offers be emailied to her. Sent email to [email or web address], included weekday RNCM. [name] agreed to review bed offers and respond with preferences. [name] verbalized understanding that patient will not be able to go to SNF until 10 days after her positive COVID test.    Expected Discharge Plan: [place] Barriers to Discharge: Continued Medical Work up, Ship broker, Other (must enter comment) ([place] isolation)  Expected Discharge Plan and Services Expected Discharge Plan: [place] Choice: [place] arrangements for the past 2 months: Single Family Home                                       Social Determinants of Health (SDOH) Interventions    Readmission Risk Interventions     No data to display

## 2021-12-19 DIAGNOSIS — U071 COVID-19: Secondary | ICD-10-CM | POA: Diagnosis not present

## 2021-12-19 DIAGNOSIS — J9601 Acute respiratory failure with hypoxia: Secondary | ICD-10-CM | POA: Diagnosis not present

## 2021-12-19 MED ORDER — FUROSEMIDE 10 MG/ML IJ SOLN
40.0000 mg | Freq: Once | INTRAMUSCULAR | Status: AC
  Administered 2021-12-19: 40 mg via INTRAVENOUS
  Filled 2021-12-19: qty 4

## 2021-12-19 NOTE — TOC Progression Note (Signed)
Transition of Care [facility]) - Progression Note    Patient Details  Name: [name] MRN: [id number] Date of Birth: 1928-08-17  Transition of Care [facility]) CM/SW [place], LCSW Phone Number: 12/19/2021, 4:10 PM  Clinical Narrative:    Patient's daughter requested [facility]. Informed her they do not take [facility].    Expected Discharge Plan: [place] Barriers to Discharge: Continued Medical Work up, Ship broker, Other (must enter comment) (COVID isolation)  Expected Discharge Plan and Services Expected Discharge Plan: [place] Choice: [place] arrangements for the past 2 months: Single Family Home                                       Social Determinants of Health (SDOH) Interventions    Readmission Risk Interventions     No data to display

## 2021-12-19 NOTE — Progress Notes (Signed)
Progress Note   Patient: [name] DDU:[id number] DOB: [birth year] DOA: 12/13/2021     5 DOS: the patient was seen and examined on 12/19/2021   Brief hospital course: [name] is a 88 year old female with history of hypertension, neuropathy, hyperlipidemia, back spasms, osteoarthritis, who presents to the emergency department for chief concerns of shortness of breath.  Initial vitals in the emergency department showed temperature of 97.8, respiration rate of 20, heart rate of 63, blood pressure 132/55, SPO2 of 86% on room air and improved to 100% on 3 L nasal cannula.  Serum sodium is 124, potassium 5.5, chloride 91, bicarb 22, BUN of 48, serum creatinine of 2.09, GFR 22, nonfasting blood glucose 112, WBC 8.9, hemoglobin 10.5, platelets of 301.  Presently troponin is 17.  BNP is 228.6.  D-dimer elevated at 2.63.  COVID/influenza A/influenza B PCR positive for COVID.  EDP ordered a VQ scan that is pending and bilateral ultrasound of the lower extremities to assess for DVT.  ED treatment: Furosemide 40 mg IV one-time dose, sodium chloride 1 g tablet.  10/31: Sodium improved to 130.  Creatinine at 1.64.  Lower extremity venous Doppler was negative for DVT but VQ scan with high probability of PE. Patient was started on molnupiravir and Xarelto. History of GI bleed on Eliquis, husband was reluctant to restart Eliquis.  Agrees to give Xarelto a trial.  Patient is high risk for deterioration and mortality based on advanced age, underlying comorbidities and recent PE with COVID-19 pneumonia. Palliative care consult.  11/1: Patient was started on Xarelto yesterday.  Per family she appears little more lethargic and confused today, they were concerned that it is secondary to Xarelto, although less likely. Patient was able to answer questions appropriately with me.  Appears lethargic.  Multiple underlying comorbidities which are contributory at this stage.  Awaiting palliative care  consult.  11/2: Hemodynamically stable.  No current concern of abnormal bleeding.  Remained on 2 L of oxygen.  Daughter was also interested for SNF, PT/OT ordered.  Mentation at baseline. Palliative care consult pending.  11/3: Remained hemodynamically stable.  PT is recommending SNF.  TOC is aware. Palliative care is recommending outpatient palliative follow-up.  11/4: Patient remained stable, saturating well on room air now.  Awaiting disposition to SNF.  11/5: Remains stable.  Have to complete 10 days of quarantine for SNF, should be able to go on Friday, 12/24/2021   Assessment and Plan: * Acute hypoxemic respiratory failure due to COVID-19 [facility]) Respiratory status improved, now on room air Completed the course of molnupiravir and steroid - Symptomatic support: Robitussin-DM and Tussionex as needed for cough ordered - Airborne and contact precautions -  Pneumonia due to COVID-19 virus Procalcitonin remained negative, worsening leukocytosis most likely secondary to steroid -Completed a course of molnupiravir and steroid - Oxygen supplementation as needed  Acute pulmonary embolism (HCC) VQ scan with high probability of PE.  Lower extremity venous Doppler was negative for DVT. Patient has an history of GI bleed with Eliquis, husband little reluctant to restart but agrees to try Xarelto. -Started on Xarelto -remained stable with no obvious bleeding -Continue Xarelto and monitor  Shortness of breath - Multifactorial including heart failure exacerbation in setting of COVID-19 infection, COVID-19 pneumonia and PE.  Hyponatremia Improving, sodium at 133  With COVID-19 infection and elevated BNP, avoiding IV fluid at this time.  Low urine osmolality and urinary sodium of 18. - Sodium chloride 1 g p.o. ordered by EDP -Home Lasix was  restarted -Monitor sodium  AKI (acute kidney injury) ([place]) Some improvement of creatinine, 164>>1.34>1.12 - Baseline serum creatinine is  1.12-1.22/GFR 42-47 - Etiology is multifactorial including cardiorenal versus intrarenal in setting of COVID-19 infection -Monitor renal function -Avoid nephrotoxins  Hyperkalemia Resolved. - Presumed secondary to acute kidney injury  Hypercholesteremia - Simvastatin 10 mg daily resumed  Restless legs syndrome - Gabapentin 200 mg nightly  At risk for polypharmacy - Resumed home tramadol 50 mg nightly.  For moderate pain - Ordered oxycodone 5 mg p.o. 3 times daily as needed for severe pain - Patient takes gabapentin 200 mg nightly -Patient currently not requiring any tramadol or oxycodone  Essential hypertension Blood pressure within goal. -Continuing home amlodipine -Restart home Lasix  -Keep holding irbesartan   Subjective: Patient was seen and examined today.  No new complaint.  Physical Exam: Vitals:   12/18/21 1832 12/18/21 2102 12/19/21 0626 12/19/21 1521  BP: (!) 153/75 (!) 143/54 (!) 130/47 (!) 144/58  Pulse: 91 81 63 85  Resp: '18 16 16 18  '$ Temp: 98.1 F (36.7 C) 98.5 F (36.9 C) 98.1 F (36.7 C) 98.2 F (36.8 C)  TempSrc: Oral Oral Oral Oral  SpO2: 94% 94% 95% 94%  Weight:       General.  Frail elderly lady, in no acute distress. Pulmonary.  Lungs clear bilaterally, normal respiratory effort. CV.  Regular rate and rhythm, no JVD, rub or murmur. Abdomen.  Soft, nontender, nondistended, BS positive. CNS.  Alert and oriented .  No focal neurologic deficit. Extremities.  No edema, no cyanosis, pulses intact and symmetrical. Psychiatry.  Judgment and insight appears normal.   Data Reviewed: Prior data reviewed  Family Communication: Discussed with daughter at the bedside  Disposition: Status is: Inpatient Remains inpatient appropriate because: Severity of illness   Planned Discharge Destination: Home with Home Health  DVT prophylaxis-Xarelto Time spent: 38 minutes  This record has been created using Systems analyst. Errors have  been sought and corrected,but may not always be located. Such creation errors do not reflect on the standard of care.  Author: Lorella Nimrod, MD 12/19/2021 4:58 PM  For on call review [email or web address].

## 2021-12-20 DIAGNOSIS — U071 COVID-19: Secondary | ICD-10-CM | POA: Diagnosis not present

## 2021-12-20 DIAGNOSIS — J9601 Acute respiratory failure with hypoxia: Secondary | ICD-10-CM | POA: Diagnosis not present

## 2021-12-20 LAB — COMPREHENSIVE METABOLIC PANEL
ALT: 16 U/L (ref 0–44)
AST: 16 U/L (ref 15–41)
Albumin: 2.9 g/dL — ABNORMAL LOW (ref 3.5–5.0)
Alkaline Phosphatase: 43 U/L (ref 38–126)
Anion gap: 9 (ref 5–15)
BUN: 28 mg/dL — ABNORMAL HIGH (ref 8–23)
CO2: 29 mmol/L (ref 22–32)
Calcium: 8.5 mg/dL — ABNORMAL LOW (ref 8.9–10.3)
Chloride: 99 mmol/L (ref 98–111)
Creatinine, Ser: 0.97 mg/dL (ref 0.44–1.00)
GFR, Estimated: 56 mL/min — ABNORMAL LOW (ref >60)
Glucose, Bld: 113 mg/dL — ABNORMAL HIGH (ref 70–99)
Potassium: 3.3 mmol/L — ABNORMAL LOW (ref 3.5–5.1)
Sodium: 137 mmol/L (ref 135–145)
Total Bilirubin: 1.3 mg/dL — ABNORMAL HIGH (ref 0.3–1.2)
Total Protein: 5.7 g/dL — ABNORMAL LOW (ref 6.5–8.1)

## 2021-12-20 LAB — CBC
HCT: 31.6 % — ABNORMAL LOW (ref 36.0–46.0)
Hemoglobin: 10.5 g/dL — ABNORMAL LOW (ref 12.0–15.0)
MCH: 31.3 pg (ref 26.0–34.0)
MCHC: 33.2 g/dL (ref 30.0–36.0)
MCV: 94.3 fL (ref 80.0–100.0)
Platelets: 458 10*3/uL — ABNORMAL HIGH (ref 150–400)
RBC: 3.35 MIL/uL — ABNORMAL LOW (ref 3.87–5.11)
RDW: 14.1 % (ref 11.5–15.5)
WBC: 15.1 10*3/uL — ABNORMAL HIGH (ref 4.0–10.5)
nRBC: 0 % (ref 0.0–0.2)

## 2021-12-20 LAB — MAGNESIUM: Magnesium: 2.2 mg/dL (ref 1.7–2.4)

## 2021-12-20 LAB — PROCALCITONIN: Procalcitonin: <0.1 ng/mL

## 2021-12-20 MED ORDER — OXYMETAZOLINE HCL 0.05 % NA SOLN
2.0000 | Freq: Two times a day (BID) | NASAL | Status: AC | PRN
  Administered 2021-12-20: 2 via NASAL
  Filled 2021-12-20: qty 15

## 2021-12-20 MED ORDER — POTASSIUM CHLORIDE 20 MEQ PO PACK
40.0000 meq | PACK | Freq: Once | ORAL | Status: AC
  Administered 2021-12-20: 40 meq via ORAL
  Filled 2021-12-20: qty 2

## 2021-12-20 NOTE — TOC Progression Note (Signed)
Transition of Care [facility]) - Progression Note    Patient Details  Name: [name] MRN: [id number] Date of Birth: 05-15-1928  Transition of Care [facility]) CM/SW Contact  Beverly Sessions, RN Phone Number: 12/20/2021, 2:14 PM  Clinical Narrative:     Received call from daughter.  She would like to wait to hear back if [facility] can offer a bed before making a decision.  Referral resent to Magda Paganini at [facility] and message sent to request she review   Expected Discharge Plan: [place] Barriers to Discharge: Continued Medical Work up, Ship broker, Other (must enter comment) ([place] isolation)  Expected Discharge Plan and Services Expected Discharge Plan: [place] Choice: [place] arrangements for the past 2 months: Single Family Home                                       Social Determinants of Health (SDOH) Interventions    Readmission Risk Interventions     No data to display

## 2021-12-20 NOTE — Plan of Care (Signed)
Patient states she is more anxious when its dark.  Lights in room and TV left on overnight for patient comfort.  Patient states more at ease and expressed appreciation.

## 2021-12-20 NOTE — Progress Notes (Signed)
Physical Therapy Treatment Patient Details Name: [name] MRN: [id number] DOB: 1928/06/01 Today's Date: 12/20/2021   History of Present Illness 88yo F pt has been admitted 2 times in the last few weeks, most recently after sudden severe weakness at home and fall to the floor while AMB in house. Found to also be Covid + on this admission, requiring supplemental O2 here. PMH: dCHF, HTN, PAD, colon cancer s/p resection x2, back pain. At baseline pt uses a 4WW for limited household distances of 40-60', requires physical assist for all transfers.    PT Comments    Pt in chair.  Inc large loose BM in need of care.  She is taken to bathroom to allow her to use grab bar to assist in standing. She is able to stand x 3 with mod a to stand and min a once up and max a for pericare and bathing.  She needs frequent seated rest breaks and never truly achieves full standing but is able to assist enough to allow for care.  She would need +2 for any attempts at standing without stable grab bar.  She remains up in recliner after session with need met and family in room.   Recommendations for follow up therapy are one component of a multi-disciplinary discharge planning process, led by the attending physician.  Recommendations may be updated based on patient status, additional functional criteria and insurance authorization.  Follow Up Recommendations  Skilled nursing-short term rehab (<3 hours/day)     Assistance Recommended at Discharge Frequent or constant Supervision/Assistance  Patient can return home with the following Two people to help with walking and/or transfers;A lot of help with bathing/dressing/bathroom;Assist for transportation;Help with stairs or ramp for entrance;Assistance with cooking/housework   Equipment Recommendations       Recommendations for Other Services       Precautions / Restrictions Precautions Precautions: Fall Restrictions Weight Bearing Restrictions: No      Mobility  Bed Mobility               General bed mobility comments: in chair upon arrival.  Recently up with nursing.    Transfers Overall transfer level: Needs assistance Equipment used:  (bar in bathroom) Transfers: Sit to/from Stand Sit to Stand: Min assist, Mod assist (pulls up on grab bar in bathroom)                Ambulation/Gait               General Gait Details: unsafe to attempt   Stairs             Wheelchair Mobility    Modified Rankin (Stroke Patients Only)       Balance Overall balance assessment: Needs assistance Sitting-balance support: Single extremity supported, Feet supported, Bilateral upper extremity supported Sitting balance-Leahy Scale: Fair     Standing balance support: Bilateral upper extremity supported, Reliant on assistive device for balance Standing balance-Leahy Scale: Poor                              Cognition Arousal/Alertness: Awake/alert Behavior During Therapy: WFL for tasks assessed/performed Overall Cognitive Status: Within Functional Limits for tasks assessed                                          Exercises  General Comments        Pertinent Vitals/Pain Pain Assessment Pain Assessment: No/denies pain    Home Living                          Prior Function            PT Goals (current goals can now be found in the care plan section) Progress towards PT goals: Progressing toward goals    Frequency    Min 2X/week      PT Plan Current plan remains appropriate    Co-evaluation              AM-PAC PT "6 Clicks" Mobility   Outcome Measure  Help needed turning from your back to your side while in a flat bed without using bedrails?: A Lot Help needed moving from lying on your back to sitting on the side of a flat bed without using bedrails?: Total Help needed moving to and from a bed to a chair (including a wheelchair)?:  Total Help needed standing up from a chair using your arms (e.g., wheelchair or bedside chair)?: Total Help needed to walk in hospital room?: Total Help needed climbing 3-5 steps with a railing? : Total 6 Click Score: 7    End of Session Equipment Utilized During Treatment: Gait belt Activity Tolerance: Patient tolerated treatment well;Patient limited by fatigue Patient left: in chair;with call bell/phone within reach;with family/visitor present Nurse Communication: Mobility status PT Visit Diagnosis: Unsteadiness on feet (R26.81);Difficulty in walking, not elsewhere classified (R26.2);Muscle weakness (generalized) (M62.81)     Time: 7371-0626 PT Time Calculation (min) (ACUTE ONLY): 24 min  Charges:  $Therapeutic Activity: 23-37 mins                   Chesley Noon, PTA 12/20/21, 3:00 PM

## 2021-12-20 NOTE — Progress Notes (Signed)
Occupational Therapy Treatment Patient Details Name: [name] MRN: [id number] DOB: 09/22/1928 Today's Date: 12/20/2021   History of present illness 88yo F pt has been admitted 2 times in the last few weeks, most recently after sudden severe weakness at home and fall to the floor while AMB in house. Found to also be Covid + on this admission, requiring supplemental O2 here. PMH: dCHF, HTN, PAD, colon cancer s/p resection x2, back pain. At baseline pt uses a 4WW for limited household distances of 40-60', requires physical assist for all transfers.   OT comments  Pt received seated in recliner with BIL LE elevated; daughter in room and supportive throughout session. Appearing alert; willing to work with OT on grooming and UE exercises. Did not t/f this session 2/2 requiring +2 assist and none available. See flowsheet below for further details of session. Left seated in recliner; daughter at bedside; with all needs in reach.     Recommendations for follow up therapy are one component of a multi-disciplinary discharge planning process, led by the attending physician.  Recommendations may be updated based on patient status, additional functional criteria and insurance authorization.    Follow Up Recommendations  Skilled nursing-short term rehab (<3 hours/day)    Assistance Recommended at Discharge Frequent or constant Supervision/Assistance  Patient can return home with the following  Two people to help with walking and/or transfers;A lot of help with bathing/dressing/bathroom;Assistance with cooking/housework;Assist for transportation;Help with stairs or ramp for entrance;Direct supervision/assist for medications management;Direct supervision/assist for financial management   Equipment Recommendations  None recommended by OT    Recommendations for Other Services      Precautions / Restrictions Precautions Precautions: Fall Restrictions Weight Bearing Restrictions: No       Mobility  Bed Mobility                    Transfers                         Balance                                           ADL either performed or assessed with clinical judgement   ADL Overall ADL's : Needs assistance/impaired     Grooming: Oral care;Set up;Supervision/safety Grooming Details (indicate cue type and reason): Pt seated in recliner; required assistance for opening toothpaste cap. Otherwise, able to perform oral hygiene with set up.                               General ADL Comments: Pt had just been assisted a few minutes prior by PT for partial stand at bathroom grab bar for bowel hygiene, so remained seated in chair during session today.    Extremity/Trunk Assessment Upper Extremity Assessment Upper Extremity Assessment: Generalized weakness   Lower Extremity Assessment Lower Extremity Assessment: Defer to PT evaluation        Vision       Perception     Praxis      Cognition Arousal/Alertness: Awake/alert Behavior During Therapy: WFL for tasks assessed/performed Overall Cognitive Status: Within Functional Limits for tasks assessed  General Comments: baseline STM deficits; follows commands today and answers OT's questions appropriately        Exercises Exercises: General Upper Extremity General Exercises - Upper Extremity Shoulder Flexion: AROM, 10 reps (pt fatiguing; reaching approx 100 degrees flexion) Elbow Extension: AROM, 10 reps Chair Push Up: Other (comment) (attempted slight off-weighting with BIL UE pushing from chair arm rests (BIL LE still elevated); pt able to push more with LUE than RUE (increased RUE weakness)) Other Exercises Other Exercises: Demonstrated to pt how she can do straight leg raise and ankle pumps while seated in chair. Daughter in room receiving teaching as well. Pt demonstrated return.    Shoulder Instructions       General  Comments      Pertinent Vitals/ Pain       Pain Assessment Pain Assessment: No/denies pain  Home Living                                          Prior Functioning/Environment              Frequency  Min 2X/week        Progress Toward Goals  OT Goals(current goals can now be found in the care plan section)  Progress towards OT goals: Progressing toward goals  Acute Rehab OT Goals Patient Stated Goal: Get better OT Goal Formulation: With patient/family Time For Goal Achievement: 12/31/21 Potential to Achieve Goals: Good ADL Goals Pt Will Perform Grooming: with set-up;with supervision;sitting Pt Will Perform Lower Body Dressing: with mod assist;sitting/lateral leans Pt Will Transfer to Toilet: with mod assist;stand pivot transfer;bedside commode Pt Will Perform Toileting - Clothing Manipulation and hygiene: sitting/lateral leans;with min assist;with caregiver independent in assisting Additional ADL Goal #1: Pt will stand from elevated surface with RW and MOD A from caregivers in anticipation of ADL/transfers requiring PRN VC for sequencing, 3/3 opportunities.  Plan Discharge plan remains appropriate    Co-evaluation                 AM-PAC OT "6 Clicks" Daily Activity     Outcome Measure   Help from another person eating meals?: None Help from another person taking care of personal grooming?: A Little Help from another person toileting, which includes using toliet, bedpan, or urinal?: Total Help from another person bathing (including washing, rinsing, drying)?: A Lot Help from another person to put on and taking off regular upper body clothing?: A Little Help from another person to put on and taking off regular lower body clothing?: Total 6 Click Score: 14    End of Session Equipment Utilized During Treatment:  (Pt not on O2 at this time.)  OT Visit Diagnosis: Other abnormalities of gait and mobility (R26.89);History of falling  (Z91.81);Muscle weakness (generalized) (M62.81)   Activity Tolerance Patient tolerated treatment well   Patient Left in chair;with call bell/phone within reach;with family/visitor present   Nurse Communication          Time: 9449-6759 OT Time Calculation (min): 24 min  Charges: OT General Charges $OT Visit: 1 Visit OT Treatments $Self Care/Home Management : 8-22 mins $Therapeutic Exercise: 8-22 mins  Waymon Amato, MS, OTR/L   Vania Rea 12/20/2021, 4:03 PM

## 2021-12-20 NOTE — Progress Notes (Signed)
Progress Note   Patient: [name] ATF:[id number] DOB: February 23, 1928 DOA: 12/13/2021     6 DOS: the patient was seen and examined on 12/20/2021   Brief hospital course: [name] is a 88 year old female with history of hypertension, neuropathy, hyperlipidemia, back spasms, osteoarthritis, who presents to the emergency department for chief concerns of shortness of breath.  Initial vitals in the emergency department showed temperature of 97.8, respiration rate of 20, heart rate of 63, blood pressure 132/55, SPO2 of 86% on room air and improved to 100% on 3 L nasal cannula.  Serum sodium is 124, potassium 5.5, chloride 91, bicarb 22, BUN of 48, serum creatinine of 2.09, GFR 22, nonfasting blood glucose 112, WBC 8.9, hemoglobin 10.5, platelets of 301.  Presently troponin is 17.  BNP is 228.6.  D-dimer elevated at 2.63.  COVID/influenza A/influenza B PCR positive for COVID.  EDP ordered a VQ scan that is pending and bilateral ultrasound of the lower extremities to assess for DVT.  ED treatment: Furosemide 40 mg IV one-time dose, sodium chloride 1 g tablet.  10/31: Sodium improved to 130.  Creatinine at 1.64.  Lower extremity venous Doppler was negative for DVT but VQ scan with high probability of PE. Patient was started on molnupiravir and Xarelto. History of GI bleed on Eliquis, husband was reluctant to restart Eliquis.  Agrees to give Xarelto a trial.  Patient is high risk for deterioration and mortality based on advanced age, underlying comorbidities and recent PE with COVID-19 pneumonia. Palliative care consult.  11/1: Patient was started on Xarelto yesterday.  Per family she appears little more lethargic and confused today, they were concerned that it is secondary to Xarelto, although less likely. Patient was able to answer questions appropriately with me.  Appears lethargic.  Multiple underlying comorbidities which are contributory at this stage.  Awaiting palliative care  consult.  11/2: Hemodynamically stable.  No current concern of abnormal bleeding.  Remained on 2 L of oxygen.  Daughter was also interested for SNF, PT/OT ordered.  Mentation at baseline. Palliative care consult pending.  11/3: Remained hemodynamically stable.  PT is recommending SNF.  TOC is aware. Palliative care is recommending outpatient palliative follow-up.  11/4: Patient remained stable, saturating well on room air now.  Awaiting disposition to SNF.  11/5: Remains stable.  Have to complete 10 days of quarantine for SNF, should be able to go on Friday, 12/24/2021  11/6: Patient with some leukocytosis and hypokalemia with potassium of 3.3.  Procalcitonin remain negative, most likely secondary to steroid use.  Magnesium at 2.2. Having some nose bleed-compression and Afrin helped   Assessment and Plan: * Acute hypoxemic respiratory failure due to COVID-19 [facility]) Respiratory status improved, now on room air Completed the course of molnupiravir and steroid - Symptomatic support: Robitussin-DM and Tussionex as needed for cough ordered - Airborne and contact precautions -  Pneumonia due to COVID-19 virus Procalcitonin remained negative, worsening leukocytosis most likely secondary to steroid -Completed a course of molnupiravir and steroid - Oxygen supplementation as needed  Acute pulmonary embolism (HCC) VQ scan with high probability of PE.  Lower extremity venous Doppler was negative for DVT. Patient has an history of GI bleed with Eliquis, husband little reluctant to restart but agrees to try Xarelto. -Started on Xarelto -remained stable with no obvious bleeding -Continue Xarelto and monitor  Shortness of breath - Multifactorial including heart failure exacerbation in setting of COVID-19 infection, COVID-19 pneumonia and PE.  Hyponatremia Improving, sodium at 133  With COVID-19 infection and elevated BNP, avoiding IV fluid at this time.  Low urine osmolality and urinary sodium  of 18. - Sodium chloride 1 g p.o. ordered by EDP -Home Lasix was restarted -Monitor sodium  AKI (acute kidney injury) ([place]) Some improvement of creatinine, 164>>1.34>1.12 - Baseline serum creatinine is 1.12-1.22/GFR 42-47 - Etiology is multifactorial including cardiorenal versus intrarenal in setting of COVID-19 infection -Monitor renal function -Avoid nephrotoxins  Hyperkalemia Resolved. - Presumed secondary to acute kidney injury  Hypercholesteremia - Simvastatin 10 mg daily resumed  Restless legs syndrome - Gabapentin 200 mg nightly  At risk for polypharmacy - Resumed home tramadol 50 mg nightly.  For moderate pain - Ordered oxycodone 5 mg p.o. 3 times daily as needed for severe pain - Patient takes gabapentin 200 mg nightly -Patient currently not requiring any tramadol or oxycodone  Essential hypertension Blood pressure within goal. -Continuing home amlodipine -Restart home Lasix  -Keep holding irbesartan   Subjective: Patient was seen and examined today.  Having some nosebleed.  Physical Exam: Vitals:   12/19/21 1521 12/19/21 1946 12/20/21 0407 12/20/21 0741  BP: (!) 144/58 (!) 133/57 (!) 143/55 (!) 161/60  Pulse: 85 70 69 64  Resp: '18 18 20 16  '$ Temp: 98.2 F (36.8 C) 97.6 F (36.4 C) 98.6 F (37 C) 98.3 F (36.8 C)  TempSrc: Oral Oral Oral Oral  SpO2: 94% 96% 92% 96%  Weight:       General.  Frail elderly lady, in no acute distress. Pulmonary.  Lungs clear bilaterally, normal respiratory effort. CV.  Regular rate and rhythm, no JVD, rub or murmur. Abdomen.  Soft, nontender, nondistended, BS positive. CNS.  Alert and oriented .  No focal neurologic deficit. Extremities.  No edema, no cyanosis, pulses intact and symmetrical. Psychiatry.  Judgment and insight appears normal.   Data Reviewed: Prior data reviewed  Family Communication: Discussed with daughter and husband at the bedside  Disposition: Status is: Inpatient Remains inpatient appropriate  because: Severity of illness   Planned Discharge Destination: Home with Home Health  DVT prophylaxis-Xarelto Time spent: 39 minutes  This record has been created using Systems analyst. Errors have been sought and corrected,but may not always be located. Such creation errors do not reflect on the standard of care.  Author: Lorella Nimrod, MD 12/20/2021 3:36 PM  For on call review [email or web address].

## 2021-12-21 DIAGNOSIS — J9601 Acute respiratory failure with hypoxia: Secondary | ICD-10-CM | POA: Diagnosis not present

## 2021-12-21 DIAGNOSIS — U071 COVID-19: Secondary | ICD-10-CM | POA: Diagnosis not present

## 2021-12-21 LAB — BASIC METABOLIC PANEL
Anion gap: 8 (ref 5–15)
BUN: 25 mg/dL — ABNORMAL HIGH (ref 8–23)
CO2: 25 mmol/L (ref 22–32)
Calcium: 8.8 mg/dL — ABNORMAL LOW (ref 8.9–10.3)
Chloride: 102 mmol/L (ref 98–111)
Creatinine, Ser: 1.15 mg/dL — ABNORMAL HIGH (ref 0.44–1.00)
GFR, Estimated: 45 mL/min — ABNORMAL LOW (ref >60)
Glucose, Bld: 102 mg/dL — ABNORMAL HIGH (ref 70–99)
Potassium: 3.8 mmol/L (ref 3.5–5.1)
Sodium: 135 mmol/L (ref 135–145)

## 2021-12-21 LAB — CBC
HCT: 34 % — ABNORMAL LOW (ref 36.0–46.0)
Hemoglobin: 11.4 g/dL — ABNORMAL LOW (ref 12.0–15.0)
MCH: 31.3 pg (ref 26.0–34.0)
MCHC: 33.5 g/dL (ref 30.0–36.0)
MCV: 93.4 fL (ref 80.0–100.0)
Platelets: 464 10*3/uL — ABNORMAL HIGH (ref 150–400)
RBC: 3.64 MIL/uL — ABNORMAL LOW (ref 3.87–5.11)
RDW: 14.2 % (ref 11.5–15.5)
WBC: 14.7 10*3/uL — ABNORMAL HIGH (ref 4.0–10.5)
nRBC: 0 % (ref 0.0–0.2)

## 2021-12-21 NOTE — TOC Progression Note (Signed)
Transition of Care [facility]) - Progression Note    Patient Details  Name: [name] MRN: [id number] Date of Birth: 1928/08/24  Transition of Care [facility]) CM/SW [place], LCSW Phone Number: 12/21/2021, 11:09 AM  Clinical Narrative:  [place] has offered a bed. Husband and daughter plan to [place] and [facility] and will follow up with decision in the morning.   Expected Discharge Plan: [place] Barriers to Discharge: Continued Medical Work up, Ship broker, Other (must enter comment) (COVID isolation)  Expected Discharge Plan and Services Expected Discharge Plan: [place] Choice: [place] arrangements for the past 2 months: Single Family Home                                       Social Determinants of Health (SDOH) Interventions    Readmission Risk Interventions     No data to display

## 2021-12-21 NOTE — Plan of Care (Signed)
Pt alert to self and periodically place. Pt requested to sleep in chair during overnight. Multiple attempts to talk pt into bed. She said she felt better sleeping in chair. Vitals stable. Pt on room air.  Problem: Education: Goal: Knowledge of risk factors and measures for prevention of condition will improve Outcome: Progressing   Problem: Coping: Goal: Psychosocial and spiritual needs will be supported Outcome: Progressing   Problem: Respiratory: Goal: Will maintain a patent airway Outcome: Progressing Goal: Complications related to the disease process, condition or treatment will be avoided or minimized Outcome: Progressing   Problem: Education: Goal: Knowledge of General Education information will improve Description: Including pain rating scale, medication(s)/side effects and non-pharmacologic comfort measures Outcome: Progressing   Problem: Health Behavior/Discharge Planning: Goal: Ability to manage health-related needs will improve Outcome: Progressing   Problem: Clinical Measurements: Goal: Ability to maintain clinical measurements within normal limits will improve Outcome: Progressing Goal: Will remain free from infection Outcome: Progressing Goal: Diagnostic test results will improve Outcome: Progressing Goal: Respiratory complications will improve Outcome: Progressing Goal: Cardiovascular complication will be avoided Outcome: Progressing   Problem: Activity: Goal: Risk for activity intolerance will decrease Outcome: Progressing   Problem: Nutrition: Goal: Adequate nutrition will be maintained Outcome: Progressing   Problem: Coping: Goal: Level of anxiety will decrease Outcome: Progressing   Problem: Elimination: Goal: Will not experience complications related to bowel motility Outcome: Progressing Goal: Will not experience complications related to urinary retention Outcome: Progressing   Problem: Pain Managment: Goal: General experience of comfort  will improve Outcome: Progressing   Problem: Safety: Goal: Ability to remain free from injury will improve Outcome: Progressing   Problem: Skin Integrity: Goal: Risk for impaired skin integrity will decrease Outcome: Progressing

## 2021-12-21 NOTE — Progress Notes (Signed)
Progress Note   Patient: [name] ZOX:[id number] DOB: [birth year] DOA: 12/13/2021     7 DOS: the patient was seen and examined on 12/21/2021   Brief hospital course: [name] is a 88 year old female with history of hypertension, neuropathy, hyperlipidemia, back spasms, osteoarthritis, who presents to the emergency department for chief concerns of shortness of breath.  Initial vitals in the emergency department showed temperature of 97.8, respiration rate of 20, heart rate of 63, blood pressure 132/55, SPO2 of 86% on room air and improved to 100% on 3 L nasal cannula.  Serum sodium is 124, potassium 5.5, chloride 91, bicarb 22, BUN of 48, serum creatinine of 2.09, GFR 22, nonfasting blood glucose 112, WBC 8.9, hemoglobin 10.5, platelets of 301.  Presently troponin is 17.  BNP is 228.6.  D-dimer elevated at 2.63.  COVID/influenza A/influenza B PCR positive for COVID.  EDP ordered a VQ scan that is pending and bilateral ultrasound of the lower extremities to assess for DVT.  ED treatment: Furosemide 40 mg IV one-time dose, sodium chloride 1 g tablet.  10/31: Sodium improved to 130.  Creatinine at 1.64.  Lower extremity venous Doppler was negative for DVT but VQ scan with high probability of PE. Patient was started on molnupiravir and Xarelto. History of GI bleed on Eliquis, husband was reluctant to restart Eliquis.  Agrees to give Xarelto a trial.  Patient is high risk for deterioration and mortality based on advanced age, underlying comorbidities and recent PE with COVID-19 pneumonia. Palliative care consult.  11/1: Patient was started on Xarelto yesterday.  Per family she appears little more lethargic and confused today, they were concerned that it is secondary to Xarelto, although less likely. Patient was able to answer questions appropriately with me.  Appears lethargic.  Multiple underlying comorbidities which are contributory at this stage.  Awaiting palliative care  consult.  11/2: Hemodynamically stable.  No current concern of abnormal bleeding.  Remained on 2 L of oxygen.  Daughter was also interested for SNF, PT/OT ordered.  Mentation at baseline. Palliative care consult pending.  11/3: Remained hemodynamically stable.  PT is recommending SNF.  TOC is aware. Palliative care is recommending outpatient palliative follow-up.  11/4: Patient remained stable, saturating well on room air now.  Awaiting disposition to SNF.  11/5: Remains stable.  Have to complete 10 days of quarantine for SNF, should be able to go on Friday, 12/24/2021  11/6: Patient with some leukocytosis and hypokalemia with potassium of 3.3.  Procalcitonin remain negative, most likely secondary to steroid use.  Magnesium at 2.2. Having some nose bleed-compression and Afrin helped.  11/7: Remains stable on room air.  Improving leukocytosis, slight increase in creatinine.  Encouraging p.o. hydration..  Hemoglobin remained stable. Going to SNF on Friday after completing 10 days of quarantine   Assessment and Plan: * Acute hypoxemic respiratory failure due to COVID-19 [facility]) Respiratory status improved, now on room air Completed the course of molnupiravir and steroid - Symptomatic support: Robitussin-DM and Tussionex as needed for cough ordered - Airborne and contact precautions -  Pneumonia due to COVID-19 virus Procalcitonin remained negative, worsening leukocytosis most likely secondary to steroid -Completed a course of molnupiravir and steroid - Oxygen supplementation as needed  Acute pulmonary embolism (HCC) VQ scan with high probability of PE.  Lower extremity venous Doppler was negative for DVT. Patient has an history of GI bleed with Eliquis, husband little reluctant to restart but agrees to try Xarelto. -Started on Xarelto -remained stable with  no obvious bleeding -Continue Xarelto and monitor  Shortness of breath - Multifactorial including heart failure exacerbation in  setting of COVID-19 infection, COVID-19 pneumonia and PE.  Hyponatremia Improving, sodium at 133  With COVID-19 infection and elevated BNP, avoiding IV fluid at this time.  Low urine osmolality and urinary sodium of 18. - Sodium chloride 1 g p.o. ordered by EDP -Home Lasix was restarted -Monitor sodium  AKI (acute kidney injury) ([place]) Some improvement of creatinine, 164>>1.34>1.12 - Baseline serum creatinine is 1.12-1.22/GFR 42-47 - Etiology is multifactorial including cardiorenal versus intrarenal in setting of COVID-19 infection -Monitor renal function -Avoid nephrotoxins  Hyperkalemia Resolved. - Presumed secondary to acute kidney injury  Hypercholesteremia - Simvastatin 10 mg daily resumed  Restless legs syndrome - Gabapentin 200 mg nightly  At risk for polypharmacy - Resumed home tramadol 50 mg nightly.  For moderate pain - Ordered oxycodone 5 mg p.o. 3 times daily as needed for severe pain - Patient takes gabapentin 200 mg nightly -Patient currently not requiring any tramadol or oxycodone  Essential hypertension Blood pressure within goal. -Continuing home amlodipine -Restart home Lasix  -Keep holding irbesartan   Subjective: Patient was seen and examined today.  No new complaints.  Sitting comfortably in chair and chatting with family.  Physical Exam: Vitals:   12/20/21 1637 12/20/21 1949 12/21/21 0641 12/21/21 0919  BP: (!) 145/56 (!) 128/53 (!) 145/73 (!) 121/54  Pulse: 78 77 72 82  Resp: '18 18 18   '$ Temp: 97.9 F (36.6 C) 98.2 F (36.8 C) 98.4 F (36.9 C)   TempSrc:  Oral Oral   SpO2: 97% 93% 94% 96%  Weight:       General.  Pleasant elderly lady, in no acute distress. Pulmonary.  Lungs clear bilaterally, normal respiratory effort. CV.  Regular rate and rhythm, no JVD, rub or murmur. Abdomen.  Soft, nontender, nondistended, BS positive. CNS.  Alert and oriented .  No focal neurologic deficit. Extremities.  No edema, no cyanosis, pulses intact and  symmetrical. Psychiatry.  Judgment and insight appears normal.   Data Reviewed: Prior data reviewed  Family Communication: Discussed with daughter and husband at the bedside  Disposition: Status is: Inpatient Remains inpatient appropriate because: Severity of illness   Planned Discharge Destination: Home with Home Health  DVT prophylaxis-Xarelto Time spent: 38 minutes  This record has been created using Systems analyst. Errors have been sought and corrected,but may not always be located. Such creation errors do not reflect on the standard of care.  Author: Lorella Nimrod, MD 12/21/2021 3:57 PM  For on call review [email or web address].

## 2021-12-22 ENCOUNTER — Ambulatory Visit: Payer: Medicare HMO | Admitting: Family

## 2021-12-22 DIAGNOSIS — U071 COVID-19: Secondary | ICD-10-CM | POA: Diagnosis not present

## 2021-12-22 DIAGNOSIS — J9601 Acute respiratory failure with hypoxia: Secondary | ICD-10-CM | POA: Diagnosis not present

## 2021-12-22 NOTE — Progress Notes (Signed)
Progress Note   Patient: [name] HYQ:[id number] DOB: 31-Jan-1929 DOA: 12/13/2021     8 DOS: the patient was seen and examined on 12/22/2021   Brief hospital course: Ms. Britany Callicott is a 88 year old female with history of hypertension, neuropathy, hyperlipidemia, back spasms, osteoarthritis, who presents to the emergency department for chief concerns of shortness of breath.   Initial vitals in the emergency department showed temperature of 97.8, respiration rate of 20, heart rate of 63, blood pressure 132/55, SPO2 of 86% on room air and improved to 100% on 3 L nasal cannula.   Serum sodium is 124, potassium 5.5, chloride 91, bicarb 22, BUN of 48, serum creatinine of 2.09, GFR 22, nonfasting blood glucose 112, WBC 8.9, hemoglobin 10.5, platelets of 301.   Presently troponin is 17.  BNP is 228.6.  D-dimer elevated at 2.63.  COVID/influenza A/influenza B PCR positive for COVID.   EDP ordered a VQ scan that is pending and bilateral ultrasound of the lower extremities to assess for DVT.   ED treatment: Furosemide 40 mg IV one-time dose, sodium chloride 1 g tablet.   10/31: Sodium improved to 130.  Creatinine at 1.64.  Lower extremity venous Doppler was negative for DVT but VQ scan with high probability of PE. Patient was started on molnupiravir and Xarelto. History of GI bleed on Eliquis, husband was reluctant to restart Eliquis.  Agrees to give Xarelto a trial.   Patient is high risk for deterioration and mortality based on advanced age, underlying comorbidities and recent PE with COVID-19 pneumonia. Palliative care consult.   11/1: Patient was started on Xarelto yesterday.  Per family she appears little more lethargic and confused today, they were concerned that it is secondary to Xarelto, although less likely. Patient was able to answer questions appropriately with me.  Appears lethargic.  Multiple underlying comorbidities which are contributory at this stage.  Awaiting palliative care  consult.   11/2: Hemodynamically stable.  No current concern of abnormal bleeding.  Remained on 2 L of oxygen.  Daughter was also interested for SNF, PT/OT ordered.  Mentation at baseline. Palliative care consult pending.   11/3: Remained hemodynamically stable.  PT is recommending SNF.  TOC is aware. Palliative care is recommending outpatient palliative follow-up.   11/4: Patient remained stable, saturating well on room air now.  Awaiting disposition to SNF.   11/5: Remains stable.  Have to complete 10 days of quarantine for SNF, should be able to go on Friday, 12/24/2021   11/6: Patient with some leukocytosis and hypokalemia with potassium of 3.3.  Procalcitonin remain negative, most likely secondary to steroid use.  Magnesium at 2.2. Having some nose bleed-compression and Afrin helped.   11/7: Remains stable on room air.  Improving leukocytosis, slight increase in creatinine.  Encouraging p.o. hydration..  Hemoglobin remained stable. Going to SNF on Friday after completing 10 days of quarantine   Assessment and Plan: * Acute hypoxemic respiratory failure due to COVID-19 [facility]) Respiratory status improved, now on room air Completed the course of molnupiravir and steroid - Symptomatic support: Robitussin-DM and Tussionex as needed for cough ordered - Airborne and contact precautions  Pneumonia due to COVID-19 virus Procalcitonin remained negative, worsening leukocytosis most likely secondary to steroid -Completed a course of molnupiravir and steroid - Oxygen supplementation as needed  Acute pulmonary embolism (HCC) VQ scan with high probability of PE.  Lower extremity venous Doppler was negative for DVT. Patient has an history of GI bleed with Eliquis, husband little reluctant  to restart but agrees to try Xarelto. -Started on Xarelto -remained stable with no obvious bleeding -Continue Xarelto and monitor  Shortness of breath - Multifactorial including heart failure exacerbation  in setting of COVID-19 infection, COVID-19 pneumonia and PE.  Hyponatremia resolved  AKI (acute kidney injury) ([place]) resolved  Hyperkalemia Resolved.  Hypercholesteremia - Simvastatin 10 mg daily resumed  Restless legs syndrome - Gabapentin 200 mg nightly  At risk for polypharmacy - Resumed home tramadol 50 mg nightly.  For moderate pain - Ordered oxycodone 5 mg p.o. 3 times daily as needed for severe pain - Patient takes gabapentin 200 mg nightly -Patient currently not requiring any tramadol or oxycodone  Essential hypertension Blood pressure within goal. -Continuing home amlodipine -Restart home Lasix  -Keep holding irbesartan   Subjective: Patient was seen and examined today.  No new complaints.    Physical Exam: Vitals:   12/21/21 1622 12/21/21 1950 12/22/21 0544 12/22/21 0903  BP: (!) 147/64 (!) 132/57 (!) 143/62 (!) 123/57  Pulse: 77 76 65 67  Resp: '18 18 18 18  '$ Temp: 98.3 F (36.8 C) (!) 97.5 F (36.4 C) 97.9 F (36.6 C) 98 F (36.7 C)  TempSrc:  Oral Oral Oral  SpO2: 95% 96% 97% 96%  Weight:       General.  Pleasant elderly lady, in no acute distress. Pulmonary.  Lungs clear bilaterally, normal respiratory effort. CV.  Regular rate and rhythm, no JVD, rub or murmur. Abdomen.  Soft, nontender, nondistended, BS positive. CNS.  Alert and oriented .  No focal neurologic deficit. Extremities.  No edema, no cyanosis, pulses intact and symmetrical. Psychiatry.  Judgment and insight appears normal.   Data Reviewed: Prior data reviewed  Family Communication: husband updated telephonically 11/8  Disposition: Status is: Inpatient Remains inpatient appropriate because: covid isiolation    Planned Discharge Destination: snf  DVT prophylaxis-Xarelto   Desma Maxim, MD 12/22/2021 3:52 PM  For on call review [email or web address].

## 2021-12-22 NOTE — TOC Progression Note (Addendum)
Transition of Care [facility]) - Progression Note    Patient Details  Name: [name] MRN: [id number] Date of Birth: 06/10/1928  [facility]) CM/SW [place], LCSW Phone Number: 12/22/2021, 10:52 AM  Clinical Narrative:  Received voicemail from daughter. They have accepted bed offer from [facility]. Admissions assistant is aware. Will start auth this afternoon for discharge on Friday.   4:20 pm: [employer].  Expected Discharge Plan: [place] Barriers to Discharge: Continued Medical Work up, Ship broker, Other (must enter comment) (COVID isolation)  Expected Discharge Plan and Services Expected Discharge Plan: [place] Choice: [place] arrangements for the past 2 months: Single Family Home                                       Social Determinants of Health (SDOH) Interventions    Readmission Risk Interventions     No data to display

## 2021-12-22 NOTE — Progress Notes (Signed)
Nutrition Follow-up  DOCUMENTATION CODES:   Not applicable  INTERVENTION:   -D/c Prosource Plus -D/c Boost Breeze -Magic cup TID with meals, each supplement provides 290 kcal and 9 grams of protein  -MVI with minerals daily  NUTRITION DIAGNOSIS:   Increased nutrient needs related to acute illness (COVID-19) as evidenced by estimated needs.  Ongoing  GOAL:   Patient will meet greater than or equal to 90% of their needs  Progressing   MONITOR:   PO intake, Supplement acceptance  REASON FOR ASSESSMENT:   Rounds    ASSESSMENT:   Pt with history of hypertension, neuropathy, hyperlipidemia, back spasms, osteoarthritis, who presents for chief concerns of shortness of breath.  11/2- s/p BSE- recommend regular diet  Reviewed I/O's: -650 ml x 24 hours and -6.5 L since admission  UOP: 650 ml x 24 hours  Pt unavailable at time of visit. Attempted to speak with pt via call to hospital room phone, however, unable to reach.   No meal completion data available to assess at this time. Pt refusing Boost and Prosource supplements.    Wt has been stable since admission.   Palliative care following; family understands poor prognosis but would like pt to go to SNF. Plan for discharge to SNF on 12/24/21 after COVID isolation.   Medications reviewed and include reglan.   Labs reviewed: Na: 132.    Diet Order:   Diet Order             Diet regular Room service appropriate? Yes; Fluid consistency: Thin  Diet effective now                   EDUCATION NEEDS:   No education needs have been identified at this time  Skin:  Skin Assessment: Skin Integrity Issues: Skin Integrity Issues:: Other (Comment) Other: skin tears to rt and lt upper arm  Last BM:  12/21/21 (type 7)  Height:   Ht Readings from Last 1 Encounters:  11/17/21 '5\' 3"'$  (1.6 m)    Weight:   Wt Readings from Last 1 Encounters:  12/16/21 72.3 kg    Ideal Body Weight:  52.3 kg  BMI:  Body mass  index is 28.24 kg/m.  Estimated Nutritional Needs:   Kcal:  1550-1750  Protein:  80-95 grams  Fluid:  > 1.5 L    Loistine Chance, RD, LDN, [place] Registered Dietitian II Certified Diabetes Care and Education Specialist Please refer to [facility] for RD and/or RD on-call/weekend/after hours pager

## 2021-12-22 NOTE — Progress Notes (Signed)
Physical Therapy Treatment Patient Details Name: [name] MRN: [id number] DOB: Aug 20, 1928 Today's Date: 12/22/2021   History of Present Illness 88yo F pt has been admitted 2 times in the last few weeks, most recently after sudden severe weakness at home and fall to the floor while AMB in house. Found to also be Covid + on this admission, requiring supplemental O2 here. PMH: dCHF, HTN, PAD, colon cancer s/p resection x2, back pain. At baseline pt uses a 4WW for limited household distances of 40-60', requires physical assist for all transfers.    PT Comments    Patient continues to require significant assistance with bed mobility. She was unable to perform squat pivot to the chair with one person assistance despite maximal cues for technique. Limited by generalized weakness. Total assistance for incremental lateral scoot transfer along the edge of bed. Recommend to continue PT to maximize independence and decrease caregiver burden. SNF is recommended at discharge.    Recommendations for follow up therapy are one component of a multi-disciplinary discharge planning process, led by the attending physician.  Recommendations may be updated based on patient status, additional functional criteria and insurance authorization.  Follow Up Recommendations  Skilled nursing-short term rehab (<3 hours/day) Can patient physically be transported by private vehicle: No   Assistance Recommended at Discharge Frequent or constant Supervision/Assistance  Patient can return home with the following Two people to help with walking and/or transfers;A lot of help with bathing/dressing/bathroom;Assist for transportation;Help with stairs or ramp for entrance;Assistance with cooking/housework   Equipment Recommendations   (to be determined at next level of care)    Recommendations for Other Services       Precautions / Restrictions Precautions Precautions: Fall Restrictions Weight Bearing Restrictions: No      Mobility  Bed Mobility Overal bed mobility: Needs Assistance Bed Mobility: Supine to Sit, Sit to Supine     Supine to sit: Mod assist Sit to supine: Max assist   General bed mobility comments: bed mobility performed x 2 as patient requesting to sit up loner or get to the chair. verbal cues for technique and sequencing. increased time and effort required with bed mobility efforts    Transfers Overall transfer level: Needs assistance                 General transfer comment: total assistance for incremental lateral scooting transfer towards head of bed (to the left). attempt to perform squat pivot transfer x 3 times with maximal cues for technique, however patient is unable to with poor initiation and generalized weakness. recommend hoyer lift for routine OOB to chair activity with nursing staff.    Ambulation/Gait                   Stairs             Wheelchair Mobility    Modified Rankin (Stroke Patients Only)       Balance Overall balance assessment: Needs assistance Sitting-balance support: Feet supported, Single extremity supported Sitting balance-Leahy Scale: Fair                                      Cognition Arousal/Alertness: Awake/alert Behavior During Therapy: WFL for tasks assessed/performed Overall Cognitive Status: Within Functional Limits for tasks assessed  Exercises      General Comments        Pertinent Vitals/Pain Pain Assessment Pain Assessment: No/denies pain    Home Living                          Prior Function            PT Goals (current goals can now be found in the care plan section) Acute Rehab PT Goals Patient Stated Goal: to be able to get out of bed PT Goal Formulation: With patient Time For Goal Achievement: 12/29/21 Potential to Achieve Goals: Fair Progress towards PT goals: Progressing toward goals     Frequency    Min 2X/week      PT Plan Current plan remains appropriate    Co-evaluation              AM-PAC PT "6 Clicks" Mobility   Outcome Measure  Help needed turning from your back to your side while in a flat bed without using bedrails?: A Lot Help needed moving from lying on your back to sitting on the side of a flat bed without using bedrails?: Total Help needed moving to and from a bed to a chair (including a wheelchair)?: Total Help needed standing up from a chair using your arms (e.g., wheelchair or bedside chair)?: Total Help needed to walk in hospital room?: Total Help needed climbing 3-5 steps with a railing? : Total 6 Click Score: 7    End of Session   Activity Tolerance: Patient limited by fatigue Patient left: in bed;with call bell/phone within reach;with bed alarm set (sitting with head of bed elevated and set-up with coffee per her request, no family in the room) Nurse Communication: Mobility status PT Visit Diagnosis: Unsteadiness on feet (R26.81);Difficulty in walking, not elsewhere classified (R26.2);Muscle weakness (generalized) (M62.81)     Time: 5638-7564 PT Time Calculation (min) (ACUTE ONLY): 22 min  Charges:  $Therapeutic Activity: 8-22 mins                     Minna Merritts, PT, MPT    Percell Locus 12/22/2021, 11:35 AM

## 2021-12-23 DIAGNOSIS — U071 COVID-19: Secondary | ICD-10-CM | POA: Diagnosis not present

## 2021-12-23 DIAGNOSIS — J9601 Acute respiratory failure with hypoxia: Secondary | ICD-10-CM | POA: Diagnosis not present

## 2021-12-23 NOTE — TOC Progression Note (Addendum)
Transition of Care [facility]) - Progression Note    Patient Details  Name: [name] MRN: [id number] Date of Birth: 1928-12-30  Transition of [facility]) CM/SW [place], LCSW Phone Number: 12/23/2021, 8:34 AM  Clinical Narrative:  Josem Kaufmann approved: [id number]. Valid 11/10-11/14. Left message for [facility] admissions assistant to notify.  2:38 pm: Updated daughter.  Expected Discharge Plan: [place] Barriers to Discharge: Continued Medical Work up, Ship broker, Other (must enter comment) (COVID isolation)  Expected Discharge Plan and Services Expected Discharge Plan: [place] Choice: [place] arrangements for the past 2 months: Single Family Home                                       Social Determinants of Health (SDOH) Interventions    Readmission Risk Interventions     No data to display

## 2021-12-23 NOTE — Progress Notes (Signed)
Occupational Therapy Treatment Patient Details Name: [name] MRN: [id number] DOB: 1928-12-15 Today's Date: 12/23/2021   History of present illness 88yo F pt has been admitted 2 times in the last few weeks, most recently after sudden severe weakness at home and fall to the floor while AMB in house. Found to also be Covid + on this admission, requiring supplemental O2 here. PMH: dCHF, HTN, PAD, colon cancer s/p resection x2, back pain. At baseline pt uses a 4WW for limited household distances of 40-60', requires physical assist for all transfers.   OT comments  Patient sitting EOB upon arrival, agreeable to OT and PT co-treatment. Patient eager to get in recliner. Patient was able to perform 2 transfers sit<>stand and Stand pivot transfer to recliner with max A +2. Patient required manual facilitation to stand upright, stood for ~30 seconds. Patient left in recliner with call bell in reach, chair alarm set, and all needs met.    Recommendations for follow up therapy are one component of a multi-disciplinary discharge planning process, led by the attending physician.  Recommendations may be updated based on patient status, additional functional criteria and insurance authorization.    Follow Up Recommendations  Skilled nursing-short term rehab (<3 hours/day)    Assistance Recommended at Discharge Frequent or constant Supervision/Assistance  Patient can return home with the following  Two people to help with walking and/or transfers;A lot of help with bathing/dressing/bathroom;Assistance with cooking/housework;Assist for transportation;Help with stairs or ramp for entrance;Direct supervision/assist for medications management;Direct supervision/assist for financial management   Equipment Recommendations  None recommended by OT       Precautions / Restrictions Precautions Precautions: Fall Restrictions Weight Bearing Restrictions: No       Mobility Bed Mobility                General bed mobility comments: Patient sitting EOB upon arrival.    Transfers Overall transfer level: Needs assistance Equipment used: Rolling walker (2 wheels) Transfers: Sit to/from Stand, Bed to chair/wheelchair/BSC Sit to Stand: Max assist, +2 physical assistance Stand pivot transfers: Max assist, +2 physical assistance         General transfer comment: the patient completed 2 transfers this session with cues for technique. faciliation for hip extension and left knee blocked to prevent buckling. stand pivot performed from bed to recliner chair and sit to stand transfer performed from the recliner using the rolling walker.     Balance Overall balance assessment: Needs assistance Sitting-balance support: Feet supported, Single extremity supported Sitting balance-Leahy Scale: Fair     Standing balance support: Bilateral upper extremity supported, Reliant on assistive device for balance Standing balance-Leahy Scale: Poor Standing balance comment: external support required to maintain stnading balance                           ADL either performed or assessed with clinical judgement   ADL Overall ADL's : Needs assistance/impaired                             Toileting- Clothing Manipulation and Hygiene: Total assistance              Extremity/Trunk Assessment Upper Extremity Assessment Upper Extremity Assessment: Generalized weakness   Lower Extremity Assessment Lower Extremity Assessment: Generalized weakness         Cognition Arousal/Alertness: Awake/alert Behavior During Therapy: WFL for tasks assessed/performed Overall Cognitive Status: Within Functional Limits for  tasks assessed                                 General Comments: patient is able to follow single step commands with increased time                   Pertinent Vitals/ Pain       Pain Assessment Pain Assessment: No/denies pain   Frequency  Min  2X/week        Progress Toward Goals  OT Goals(current goals can now be found in the care plan section)  Progress towards OT goals: Progressing toward goals  Acute Rehab OT Goals Patient Stated Goal: get better OT Goal Formulation: With patient/family Time For Goal Achievement: 12/31/21 Potential to Achieve Goals: Good  Plan Discharge plan remains appropriate    Co-evaluation    PT/OT/SLP Co-Evaluation/Treatment: Yes Reason for Co-Treatment: To address functional/ADL transfers PT goals addressed during session: Mobility/safety with mobility OT goals addressed during session: ADL's and self-care;Proper use of Adaptive equipment and DME      AM-PAC OT "6 Clicks" Daily Activity     Outcome Measure   Help from another person eating meals?: None Help from another person taking care of personal grooming?: A Little Help from another person toileting, which includes using toliet, bedpan, or urinal?: Total Help from another person bathing (including washing, rinsing, drying)?: A Lot Help from another person to put on and taking off regular upper body clothing?: A Little Help from another person to put on and taking off regular lower body clothing?: Total 6 Click Score: 14    End of Session Equipment Utilized During Treatment: Gait belt;Rolling walker (2 wheels)  OT Visit Diagnosis: Other abnormalities of gait and mobility (R26.89);History of falling (Z91.81);Muscle weakness (generalized) (M62.81)   Activity Tolerance Patient tolerated treatment well   Patient Left in chair;with call bell/phone within reach;with chair alarm set   Nurse Communication Mobility status        Time: 6568-1275 OT Time Calculation (min): 19 min  Charges: OT General Charges $OT Visit: 1 Visit OT Treatments $Therapeutic Activity: 8-22 mins    Demetries Coia, OTS 12/23/2021, 12:04 PM

## 2021-12-23 NOTE — Plan of Care (Signed)
  Problem: Education: Goal: Knowledge of risk factors and measures for prevention of condition will improve Outcome: Progressing   Problem: Coping: Goal: Psychosocial and spiritual needs will be supported Outcome: Progressing   Problem: Respiratory: Goal: Will maintain a patent airway Outcome: Progressing Goal: Complications related to the disease process, condition or treatment will be avoided or minimized Outcome: Progressing   Problem: Education: Goal: Knowledge of General Education information will improve Description: Including pain rating scale, medication(s)/side effects and non-pharmacologic comfort measures Outcome: Progressing   Problem: Health Behavior/Discharge Planning: Goal: Ability to manage health-related needs will improve Outcome: Progressing   Problem: Clinical Measurements: Goal: Ability to maintain clinical measurements within normal limits will improve Outcome: Progressing Goal: Will remain free from infection Outcome: Progressing Goal: Diagnostic test results will improve Outcome: Progressing Goal: Respiratory complications will improve Outcome: Progressing Goal: Cardiovascular complication will be avoided Outcome: Progressing

## 2021-12-23 NOTE — Progress Notes (Signed)
Progress Note   Patient: STACEY SAGO KNL:[id number] DOB: 01/06/1929 DOA: 12/13/2021     9 DOS: the patient was seen and examined on 12/23/2021   Brief hospital course: Ms. Marjani Kobel is a 88 year old female with history of hypertension, neuropathy, hyperlipidemia, back spasms, osteoarthritis, who presents to the emergency department for chief concerns of shortness of breath.   Initial vitals in the emergency department showed temperature of 97.8, respiration rate of 20, heart rate of 63, blood pressure 132/55, SPO2 of 86% on room air and improved to 100% on 3 L nasal cannula.   Serum sodium is 124, potassium 5.5, chloride 91, bicarb 22, BUN of 48, serum creatinine of 2.09, GFR 22, nonfasting blood glucose 112, WBC 8.9, hemoglobin 10.5, platelets of 301.   Presently troponin is 17.  BNP is 228.6.  D-dimer elevated at 2.63.  COVID/influenza A/influenza B PCR positive for COVID.   EDP ordered a VQ scan that is pending and bilateral ultrasound of the lower extremities to assess for DVT.   ED treatment: Furosemide 40 mg IV one-time dose, sodium chloride 1 g tablet.   10/31: Sodium improved to 130.  Creatinine at 1.64.  Lower extremity venous Doppler was negative for DVT but VQ scan with high probability of PE. Patient was started on molnupiravir and Xarelto. History of GI bleed on Eliquis, husband was reluctant to restart Eliquis.  Agrees to give Xarelto a trial.   Patient is high risk for deterioration and mortality based on advanced age, underlying comorbidities and recent PE with COVID-19 pneumonia. Palliative care consult.   11/1: Patient was started on Xarelto yesterday.  Per family she appears little more lethargic and confused today, they were concerned that it is secondary to Xarelto, although less likely. Patient was able to answer questions appropriately with me.  Appears lethargic.  Multiple underlying comorbidities which are contributory at this stage.  Awaiting palliative care  consult.   11/2: Hemodynamically stable.  No current concern of abnormal bleeding.  Remained on 2 L of oxygen.  Daughter was also interested for SNF, PT/OT ordered.  Mentation at baseline. Palliative care consult pending.   11/3: Remained hemodynamically stable.  PT is recommending SNF.  TOC is aware. Palliative care is recommending outpatient palliative follow-up.   11/4: Patient remained stable, saturating well on room air now.  Awaiting disposition to SNF.   11/5: Remains stable.  Have to complete 10 days of quarantine for SNF, should be able to go on Friday, 12/24/2021   11/6: Patient with some leukocytosis and hypokalemia with potassium of 3.3.  Procalcitonin remain negative, most likely secondary to steroid use.  Magnesium at 2.2. Having some nose bleed-compression and Afrin helped.   11/7: Remains stable on room air.  Improving leukocytosis, slight increase in creatinine.  Encouraging p.o. hydration..  Hemoglobin remained stable. Going to SNF on Friday after completing 10 days of quarantine   Assessment and Plan: * Acute hypoxemic respiratory failure due to COVID-19 [facility]) Respiratory status improved, now on room air Completed the course of molnupiravir and steroid - Symptomatic support: Robitussin-DM and Tussionex as needed for cough ordered - Airborne and contact precautions  Pneumonia due to COVID-19 virus Procalcitonin remained negative, worsening leukocytosis most likely secondary to steroid. Completed a course of molnupiravir and steroid  Acute pulmonary embolism ([place]) VQ scan with high probability of PE.  Lower extremity venous Doppler was negative for DVT. Patient has an history of GI bleed with Eliquis, husband little reluctant to restart but agrees to  try Xarelto. -Started on Xarelto -remained stable with no obvious bleeding -Continue Xarelto and monitor  Shortness of breath - Multifactorial including heart failure exacerbation in setting of COVID-19 infection,  COVID-19 pneumonia and PE.  Hyponatremia resolved  AKI (acute kidney injury) ([place]) resolved  Hyperkalemia Resolved.  Hypercholesteremia - Simvastatin 10 mg daily resumed  Restless legs syndrome - Gabapentin 200 mg nightly  At risk for polypharmacy - Resumed home tramadol 50 mg nightly.  For moderate pain - Ordered oxycodone 5 mg p.o. 3 times daily as needed for severe pain - Patient takes gabapentin 200 mg nightly -Patient currently not requiring any tramadol or oxycodone  Essential hypertension Blood pressure within goal. -Continuing home amlodipine -Restart home Lasix  -Keep holding irbesartan   Subjective: Patient was seen and examined today.  No new complaints. Tolerating diet, no constipation.    Physical Exam: Vitals:   12/22/21 2106 12/23/21 0439 12/23/21 0807 12/23/21 0900  BP: (!) 133/50 (!) 137/45 (!) 140/56   Pulse: 72 68 66   Resp: '18 18 14   '$ Temp: 98.1 F (36.7 C) 98.5 F (36.9 C) 98.3 F (36.8 C)   TempSrc:  Oral Oral   SpO2: 94% 97% 97%   Weight:    72.3 kg  Height:    5' 2.99" (1.6 m)   General.  Pleasant elderly lady, in no acute distress. Pulmonary.  Lungs clear bilaterally, normal respiratory effort. CV.  Regular rate and rhythm, no JVD, rub or murmur. Abdomen.  Soft, nontender, nondistended, BS positive. CNS.  Alert and oriented .  No focal neurologic deficit. Extremities.  No edema, no cyanosis, pulses intact and symmetrical. Psychiatry.  Judgment and insight appears normal.   Data Reviewed: Prior data reviewed  Family Communication: husband and daughter updated @ bedside 11/9  Disposition: Status is: Inpatient Remains inpatient appropriate because: covid isiolation    Planned Discharge Destination: snf  DVT prophylaxis-Xarelto   Desma Maxim, MD 12/23/2021 1:53 PM  For on call review [email or web address].

## 2021-12-23 NOTE — Progress Notes (Signed)
Physical Therapy Treatment Patient Details Name: [name] MRN: [id number] DOB: 1928-04-22 Today's Date: 12/23/2021   History of Present Illness 88yo F pt has been admitted 2 times in the last few weeks, most recently after sudden severe weakness at home and fall to the floor while AMB in house. Found to also be Covid + on this admission, requiring supplemental O2 here. PMH: dCHF, HTN, PAD, colon cancer s/p resection x2, back pain. At baseline pt uses a 4WW for limited household distances of 40-60', requires physical assist for all transfers.    PT Comments    Patient is agreeable to PT and OT co-treatment. She is eager to get out of bed. She continues to require moderate assistance for bed mobility. She performed 2 transfers with 2 person assistance. Standing tolerance of ~ 30 seconds with external support provided and facilitation for hip extension and upright standing posture. The patient was sitting in the recliner chair at end of session. Recommend to continue PT to maximize independence and facilitate return to prior level of function.    Recommendations for follow up therapy are one component of a multi-disciplinary discharge planning process, led by the attending physician.  Recommendations may be updated based on patient status, additional functional criteria and insurance authorization.  Follow Up Recommendations  Skilled nursing-short term rehab (<3 hours/day) Can patient physically be transported by private vehicle: No   Assistance Recommended at Discharge Frequent or constant Supervision/Assistance  Patient can return home with the following Two people to help with walking and/or transfers;A lot of help with bathing/dressing/bathroom;Assist for transportation;Help with stairs or ramp for entrance;Assistance with cooking/housework   Equipment Recommendations       Recommendations for Other Services       Precautions / Restrictions Precautions Precautions:  Fall Restrictions Weight Bearing Restrictions: No     Mobility  Bed Mobility Overal bed mobility: Needs Assistance Bed Mobility: Supine to Sit     Supine to sit: Mod assist     General bed mobility comments: assistance for trunk support and for scooting pelvis foward in preparation for standing. cues for technique    Transfers Overall transfer level: Needs assistance Equipment used: Rolling walker (2 wheels) Transfers: Sit to/from Stand, Bed to chair/wheelchair/BSC Sit to Stand: Max assist, +2 physical assistance (left knee blocked) Stand pivot transfers: Max assist, +2 physical assistance         General transfer comment: the patient completed 2 transfers this session with cues for technique. faciliation for hip extension and left knee blocked to prevent buckling. stand pivot performed from bed to recliner chair and sit to stand transfer performed from the recliner using the rolling walker.    Ambulation/Gait             Pre-gait activities: emphasis on hip extension, upright standing posture. faciliation provided for anterior weight shifting. patient able to stand for ~ 30 seconds for peri-care. General Gait Details: unsafe to attempt   Stairs             Wheelchair Mobility    Modified Rankin (Stroke Patients Only)       Balance Overall balance assessment: Needs assistance Sitting-balance support: Feet supported, Single extremity supported Sitting balance-Leahy Scale: Fair     Standing balance support: Bilateral upper extremity supported, Reliant on assistive device for balance Standing balance-Leahy Scale: Poor Standing balance comment: external support required to maintain stnading balance  Cognition Arousal/Alertness: Awake/alert Behavior During Therapy: WFL for tasks assessed/performed Overall Cognitive Status: Within Functional Limits for tasks assessed                                  General Comments: patient is able to follow single step commands with increased time        Exercises      General Comments        Pertinent Vitals/Pain Pain Assessment Pain Assessment: No/denies pain    Home Living                          Prior Function            PT Goals (current goals can now be found in the care plan section) Acute Rehab PT Goals Patient Stated Goal: to get out of bed PT Goal Formulation: With patient Time For Goal Achievement: 12/29/21 Potential to Achieve Goals: Fair Progress towards PT goals: Progressing toward goals    Frequency    Min 2X/week      PT Plan Current plan remains appropriate    Co-evaluation PT/OT/SLP Co-Evaluation/Treatment: Yes Reason for Co-Treatment: To address functional/ADL transfers PT goals addressed during session: Mobility/safety with mobility        AM-PAC PT "6 Clicks" Mobility   Outcome Measure  Help needed turning from your back to your side while in a flat bed without using bedrails?: A Lot Help needed moving from lying on your back to sitting on the side of a flat bed without using bedrails?: Total Help needed moving to and from a bed to a chair (including a wheelchair)?: Total Help needed standing up from a chair using your arms (e.g., wheelchair or bedside chair)?: Total Help needed to walk in hospital room?: Total Help needed climbing 3-5 steps with a railing? : Total 6 Click Score: 7    End of Session Equipment Utilized During Treatment: Gait belt Activity Tolerance: Patient limited by fatigue Patient left: with call bell/phone within reach;in chair;with chair alarm set Nurse Communication: Mobility status;Need for lift equipment (recommend to use the lift to get patient back to bed for safety) PT Visit Diagnosis: Unsteadiness on feet (R26.81);Difficulty in walking, not elsewhere classified (R26.2);Muscle weakness (generalized) (M62.81)     Time: 6553-7482 PT Time Calculation  (min) (ACUTE ONLY): 23 min  Charges:  $Therapeutic Activity: 8-22 mins                     Minna Merritts, PT, MPT   Percell Locus 12/23/2021, 11:41 AM

## 2021-12-24 DIAGNOSIS — E878 Other disorders of electrolyte and fluid balance, not elsewhere classified: Secondary | ICD-10-CM | POA: Diagnosis not present

## 2021-12-24 DIAGNOSIS — K219 Gastro-esophageal reflux disease without esophagitis: Secondary | ICD-10-CM | POA: Diagnosis not present

## 2021-12-24 DIAGNOSIS — M1990 Unspecified osteoarthritis, unspecified site: Secondary | ICD-10-CM | POA: Diagnosis not present

## 2021-12-24 DIAGNOSIS — R488 Other symbolic dysfunctions: Secondary | ICD-10-CM | POA: Diagnosis not present

## 2021-12-24 DIAGNOSIS — Z7401 Bed confinement status: Secondary | ICD-10-CM | POA: Diagnosis not present

## 2021-12-24 DIAGNOSIS — Z86718 Personal history of other venous thrombosis and embolism: Secondary | ICD-10-CM | POA: Diagnosis not present

## 2021-12-24 DIAGNOSIS — J1282 Pneumonia due to coronavirus disease 2019: Secondary | ICD-10-CM | POA: Diagnosis not present

## 2021-12-24 DIAGNOSIS — R0689 Other abnormalities of breathing: Secondary | ICD-10-CM | POA: Diagnosis not present

## 2021-12-24 DIAGNOSIS — R069 Unspecified abnormalities of breathing: Secondary | ICD-10-CM | POA: Diagnosis not present

## 2021-12-24 DIAGNOSIS — Z79899 Other long term (current) drug therapy: Secondary | ICD-10-CM | POA: Diagnosis not present

## 2021-12-24 DIAGNOSIS — I5033 Acute on chronic diastolic (congestive) heart failure: Secondary | ICD-10-CM | POA: Diagnosis not present

## 2021-12-24 DIAGNOSIS — M7981 Nontraumatic hematoma of soft tissue: Secondary | ICD-10-CM | POA: Diagnosis not present

## 2021-12-24 DIAGNOSIS — N179 Acute kidney failure, unspecified: Secondary | ICD-10-CM | POA: Diagnosis not present

## 2021-12-24 DIAGNOSIS — M6281 Muscle weakness (generalized): Secondary | ICD-10-CM | POA: Diagnosis not present

## 2021-12-24 DIAGNOSIS — I480 Paroxysmal atrial fibrillation: Secondary | ICD-10-CM | POA: Diagnosis not present

## 2021-12-24 DIAGNOSIS — J9601 Acute respiratory failure with hypoxia: Secondary | ICD-10-CM | POA: Diagnosis not present

## 2021-12-24 DIAGNOSIS — I10 Essential (primary) hypertension: Secondary | ICD-10-CM | POA: Diagnosis not present

## 2021-12-24 DIAGNOSIS — G629 Polyneuropathy, unspecified: Secondary | ICD-10-CM | POA: Diagnosis not present

## 2021-12-24 DIAGNOSIS — K5900 Constipation, unspecified: Secondary | ICD-10-CM | POA: Diagnosis not present

## 2021-12-24 DIAGNOSIS — U071 COVID-19: Secondary | ICD-10-CM | POA: Diagnosis not present

## 2021-12-24 DIAGNOSIS — K649 Unspecified hemorrhoids: Secondary | ICD-10-CM | POA: Diagnosis not present

## 2021-12-24 DIAGNOSIS — L03115 Cellulitis of right lower limb: Secondary | ICD-10-CM | POA: Diagnosis not present

## 2021-12-24 MED ORDER — RIVAROXABAN 15 MG PO TABS: 15.0000 mg | ORAL_TABLET | Freq: Two times a day (BID) | ORAL | 0 refills | Status: DC

## 2021-12-24 MED ORDER — RIVAROXABAN 20 MG PO TABS: 20.0000 mg | ORAL_TABLET | Freq: Every day | ORAL | Status: DC

## 2021-12-24 NOTE — Care Management Important Message (Signed)
Important Message  Patient Details  Name: [name] MRN: [id number] Date of Birth: 11/14/1928   Medicare Important Message Given:  Yes     [name] 12/24/2021, 10:54 AM

## 2021-12-24 NOTE — TOC Transition Note (Signed)
Transition of Care [facility]) - CM/SW Discharge Note   Patient Details  Name: [name] MRN: [id number] Date of Birth: 02-Jul-1928  Transition of Care [facility]) CM/SW Contact:  Candie Chroman, LCSW Phone Number: 12/24/2021, 11:47 AM   Clinical Narrative:   Patient has orders to discharge to [facility] today. RN will call report to [phone] (Room 802). EMS transport has been arranged and she is 3rd on the list. No further concerns. CSW signing off.  Final next level of care: Skilled Nursing Facility Barriers to Discharge: Barriers Resolved   Patient Goals and CMS Choice   CMS Medicare.gov Compare Post Acute Care list provided to:: Other (Comment Required) (Told daughter how to access online) Choice offered to / list presented to : Adult Children  Discharge Placement PASRR number recieved: 12/17/21            Patient chooses bed at: [facility] [place] Patient to be transferred to facility by: EMS Name of family member notified: Ihor Dow Patient and family notified of of transfer: 12/24/21  Discharge Plan and Services     Post Acute Care Choice: [place]                               Social Determinants of Health (SDOH) Interventions     Readmission Risk Interventions     No data to display

## 2021-12-24 NOTE — Plan of Care (Signed)

## 2021-12-24 NOTE — Plan of Care (Signed)
  Problem: Education: Goal: Knowledge of risk factors and measures for prevention of condition will improve 12/24/2021 1412 by Evelena Peat, RN Outcome: Completed/Met 12/24/2021 1122 by Evelena Peat, RN Outcome: Progressing   Problem: Coping: Goal: Psychosocial and spiritual needs will be supported 12/24/2021 1412 by Evelena Peat, RN Outcome: Completed/Met 12/24/2021 1122 by Evelena Peat, RN Outcome: Progressing   Problem: Respiratory: Goal: Will maintain a patent airway 12/24/2021 1412 by Evelena Peat, RN Outcome: Completed/Met 12/24/2021 1122 by Evelena Peat, RN Outcome: Progressing Goal: Complications related to the disease process, condition or treatment will be avoided or minimized 12/24/2021 1412 by Evelena Peat, RN Outcome: Completed/Met 12/24/2021 1122 by Evelena Peat, RN Outcome: Progressing   Problem: Respiratory: Goal: Complications related to the disease process, condition or treatment will be avoided or minimized 12/24/2021 1412 by Evelena Peat, RN Outcome: Completed/Met 12/24/2021 1122 by Evelena Peat, RN Outcome: Progressing   Problem: Increased Nutrient Needs (NI-5.1) Goal: Food and/or nutrient delivery Description: Individualized approach for food/nutrient provision. Outcome: Completed/Met   Problem: Education: Goal: Knowledge of General Education information will improve Description: Including pain rating scale, medication(s)/side effects and non-pharmacologic comfort measures 12/24/2021 1412 by Evelena Peat, RN Outcome: Completed/Met 12/24/2021 1122 by Evelena Peat, RN Outcome: Progressing   Problem: Health Behavior/Discharge Planning: Goal: Ability to manage health-related needs will improve 12/24/2021 1412 by Evelena Peat, RN Outcome: Completed/Met 12/24/2021 1122 by Evelena Peat, RN Outcome: Progressing

## 2021-12-24 NOTE — Discharge Summary (Signed)
[name] LGX:[id number] DOB: 1928-07-19 DOA: 12/13/2021  PCP: [name], MD  Admit date: 12/13/2021 Discharge date: 12/24/2021  Time spent: 40 minutes  Recommendations for Outpatient Follow-up:  Pcp f/u     Discharge Diagnoses:  Principal Problem:   Acute hypoxemic respiratory failure due to COVID-19 [facility]) Active Problems:   Pneumonia due to COVID-19 virus   Acute pulmonary embolism (HCC)   Shortness of breath   AKI (acute kidney injury) ([place])   Hyponatremia   Hyperkalemia   Hypercholesteremia   Restless legs syndrome   At risk for polypharmacy   Essential hypertension   Discharge Condition: stable  Diet recommendation: heart healthy  Filed Weights   12/13/21 1309 12/16/21 0556 12/23/21 0900  Weight: 71.2 kg 72.3 kg 72.3 kg    History of present illness:  From admission h and p [name] is a 88 year old female with history of hypertension, neuropathy, hyperlipidemia, back spasms, osteoarthritis, who presents to the emergency department for chief concerns of shortness of breath.At bedside patient was able to tell me her name, her age, she knows she is in the hospital and she was able to identify her daughter [name] at bedside and her son-in-law at bedside.  She does not appear to be in acute distress.   Her daughter reports that they have been compliant with furosemide at home.  She was told however that on day of discharge patient did not receive her furosemide/Lasix medication prior to discharge home. She reports that patient has always had shortness of breath and really did not improve even on discharge.  She reports initially the shortness of breath was worse with exertion and patient has been mostly in bed and not exerting herself.   Daughter did notice that on day of discharge once patient got home, patient went to the restroom to pee and was struggling due to the exertion.   [name] states that today she noticed that her mother was struggling even at  rest and so she checked pulse ox today and it was 84% on RA, and EMS was called.    Family denies fever, nausea, vomiting.    Family reports a good bowel movement on Sunday.    Hospital Course:  Patient presented with acute hypoxic respiratory failure 2/2 covid pneumonia. She was treated with a course of molnupiravir and steroids and her symptoms resolved, weaned of oxygen. VQ scan also with high probability of PE, no DVT on dopplers. Patient has a history of GI bleed with eliquis, patient started on xarelto, would plan on minimum 3 months treatment. Completed 10 days isolation here, discharged to SNF.  Procedures: none   Consultations: none  Discharge Exam: Vitals:   12/24/21 0345 12/24/21 0918  BP: (!) 139/52 (!) 123/57  Pulse: 63 73  Resp: 16 18  Temp: 98.5 F (36.9 C) 97.7 F (36.5 C)  SpO2: 98% 99%    General: NAD Cardiovascular: RRR Respiratory: CTAB  Discharge Instructions   Discharge Instructions     Diet - low sodium heart healthy   Complete by: As directed    Increase activity slowly   Complete by: As directed    No wound care   Complete by: As directed       Allergies as of 12/24/2021       Reactions   Fentanyl Other (See Comments)   confusion        Medication List     STOP taking these medications    irbesartan 300 MG tablet Commonly  known as: AVAPRO   oxyCODONE 5 MG immediate release tablet Commonly known as: Oxy IR/ROXICODONE   traMADol 50 MG tablet Commonly known as: ULTRAM       TAKE these medications    acetaminophen 500 MG tablet Commonly known as: TYLENOL Take 500 mg by mouth every 6 (six) hours as needed.   albuterol 108 (90 Base) MCG/ACT inhaler Commonly known as: VENTOLIN HFA TAKE 2 PUFFS BY MOUTH EVERY 6 HOURS AS NEEDED FOR WHEEZE OR SHORTNESS OF BREATH   amLODipine 5 MG tablet Commonly known as: NORVASC TAKE 1 TABLET EVERY DAY   calcium carbonate 1500 (600 Ca) MG Tabs tablet Commonly known as: OSCAL Take  600 mg of elemental calcium by mouth 2 (two) times daily with a meal.   diclofenac Sodium 1 % Gel Commonly known as: VOLTAREN Apply 2 g topically 3 (three) times daily.   docusate sodium 100 MG capsule Commonly known as: COLACE Take 100 mg by mouth 2 (two) times daily.   fexofenadine 60 MG tablet Commonly known as: ALLEGRA TAKE 1 TABLET EVERY DAY   furosemide 20 MG tablet Commonly known as: Lasix Take 1 tablet (20 mg total) by mouth daily. What changed: how much to take   gabapentin 100 MG capsule Commonly known as: NEURONTIN TAKE 2 CAPSULES AT BEDTIME   metoCLOPramide 5 MG tablet Commonly known as: REGLAN Take 5 mg by mouth 2 (two) times daily.   metoprolol succinate 25 MG 24 hr tablet Commonly known as: TOPROL-XL TAKE 1 TABLET TWICE DAILY   ondansetron 4 MG disintegrating tablet Commonly known as: Zofran ODT Take 1 tablet (4 mg total) by mouth 2 (two) times daily as needed for nausea or vomiting.   optichamber diamond Misc   pantoprazole 40 MG tablet Commonly known as: PROTONIX Take 40 mg by mouth 2 (two) times daily.   Rivaroxaban 15 MG Tabs tablet Commonly known as: XARELTO Take 1 tablet (15 mg total) by mouth 2 (two) times daily with a meal.   rivaroxaban 20 MG Tabs tablet Commonly known as: XARELTO Take 1 tablet (20 mg total) by mouth daily with supper. Start taking on: January 04, 2022   simvastatin 10 MG tablet Commonly known as: ZOCOR TAKE 1 TABLET (10 MG TOTAL) AT BEDTIME.   Vitamin D3 50 MCG (2000 UT) capsule Take 2,000 Units by mouth daily.       Allergies  Allergen Reactions   Fentanyl Other (See Comments)    confusion    Contact information for after-discharge care     Destination     [facility] Preferred SNF .   Service: Skilled Nursing Contact information: [address] [place] [phone]                      The results of significant diagnostics from this  hospitalization (including imaging, microbiology, ancillary and laboratory) are listed below for reference.    Significant Diagnostic Studies: NM Pulmonary Perfusion  Result Date: 12/14/2021 CLINICAL DATA:  Pulmonary embolus suspected EXAM: NUCLEAR MEDICINE PERFUSION LUNG SCAN TECHNIQUE: Perfusion images were obtained in multiple projections after intravenous injection of radiopharmaceutical. Ventilation scans intentionally deferred if perfusion scan and chest x-ray adequate for interpretation during COVID 19 epidemic. RADIOPHARMACEUTICALS:  4.4 mCi Tc-7mMAA IV COMPARISON:  Chest x-ray dated December 13, 2021 FINDINGS: Multiple bilateral segmental perfusion defects which are mismatched when compared with most recent prior chest radiograph. IMPRESSION: Pulmonary embolism present (high probability) Critical Value/emergent results were  called by telephone at the time of interpretation on 12/14/2021 at 10:32 am to provider Milinda Hirschfeld, RN who verbally acknowledged these results. Electronically Signed   By: Yetta Glassman M.D.   On: 12/14/2021 10:34   US Venous Img Lower Bilateral  Result Date: 12/13/2021 CLINICAL DATA:  Pain with swelling EXAM: BILATERAL LOWER EXTREMITY VENOUS DOPPLER ULTRASOUND TECHNIQUE: Gray-scale sonography with graded compression, as well as color Doppler and duplex ultrasound were performed to evaluate the lower extremity deep venous systems from the level of the common femoral vein and including the common femoral, femoral, profunda femoral, popliteal and calf veins including the posterior tibial, peroneal and gastrocnemius veins when visible. The superficial great saphenous vein was also interrogated. Spectral Doppler was utilized to evaluate flow at rest and with distal augmentation maneuvers in the common femoral, femoral and popliteal veins. COMPARISON:  Left lower extremity venous Doppler ultrasound 02/20/2020 FINDINGS: RIGHT LOWER EXTREMITY Common Femoral Vein: No evidence of  thrombus. Normal compressibility, respiratory phasicity and response to augmentation. Saphenofemoral Junction: No evidence of thrombus. Normal compressibility and flow on color Doppler imaging. Profunda Femoral Vein: No evidence of thrombus. Normal compressibility and flow on color Doppler imaging. Femoral Vein: No evidence of thrombus. Normal compressibility, respiratory phasicity and response to augmentation. Popliteal Vein: No evidence of thrombus. Normal compressibility, respiratory phasicity and response to augmentation. Calf Veins: No evidence of thrombus. Normal compressibility and flow on color Doppler imaging. Superficial Great Saphenous Vein: No evidence of thrombus. Normal compressibility. Venous Reflux:  None. Other Findings:  Lower extremity soft tissue edema present. LEFT LOWER EXTREMITY Common Femoral Vein: No evidence of thrombus. Normal compressibility, respiratory phasicity and response to augmentation. Saphenofemoral Junction: No evidence of thrombus. Normal compressibility and flow on color Doppler imaging. Profunda Femoral Vein: No evidence of thrombus. Normal compressibility and flow on color Doppler imaging. Femoral Vein: No evidence of thrombus. Normal compressibility, respiratory phasicity and response to augmentation. Popliteal Vein: No evidence of thrombus. Normal compressibility, respiratory phasicity and response to augmentation. Calf Veins: No evidence of thrombus. Normal compressibility and flow on color Doppler imaging. Superficial Great Saphenous Vein: No evidence of thrombus. Normal compressibility. Venous Reflux:  None. Other Findings:  Lower extremity soft tissue edema present. IMPRESSION: No evidence of deep venous thrombosis in either lower extremity. Electronically Signed   By: Ronney Asters M.D.   On: 12/13/2021 20:57   DG Chest 2 View  Result Date: 12/13/2021 CLINICAL DATA:  Shortness of breath EXAM: CHEST - 2 VIEW COMPARISON:  Previous studies including the examination  of 12/09/2021 FINDINGS: Transverse diameter of heart is increased. There are no signs of alveolar pulmonary edema or focal pulmonary consolidation. Linear densities in left parahilar region suggest scarring or subsegmental atelectasis. There is blunting of left lateral CP angle. There is no pneumothorax. There is moderate to large fixed hiatal hernia. IMPRESSION: Cardiomegaly. There are no signs of pulmonary edema or focal pulmonary consolidation. There are small linear densities in left parahilar region suggesting scarring or subsegmental atelectasis. Blunting of left lateral CP angle may be due to minimal effusion or pleural thickening. Moderate to large fixed hiatal hernia. Electronically Signed   By: Elmer Picker M.D.   On: 12/13/2021 14:09   DG Chest Port 1 View  Result Date: 12/09/2021 CLINICAL DATA:  sob, chf EXAM: PORTABLE CHEST 1 VIEW COMPARISON:  Chest x-ray November 16, 2021. FINDINGS: Mildly improved asymmetric patchy left interstitial and airspace opacities. Small left pleural effusions. No visible pneumothorax. Mildly enlarged cardiac silhouette. IMPRESSION: 1. Mildly improved  asymmetric patchy left interstitial and airspace opacities, which could represent asymmetric edema or pneumonia. 2. Small left pleural effusion. Electronically Signed   By: Margaretha Sheffield M.D.   On: 12/09/2021 09:37    Microbiology: No results found for this or any previous visit (from the past 240 hour(s)).   Labs: Basic Metabolic Panel: Recent Labs  Lab 12/20/21 0545 12/21/21 0654  NA 137 135  K 3.3* 3.8  CL 99 102  CO2 29 25  GLUCOSE 113* 102*  BUN 28* 25*  CREATININE 0.97 1.15*  CALCIUM 8.5* 8.8*  MG 2.2  --    Liver Function Tests: Recent Labs  Lab 12/20/21 0545  AST 16  ALT 16  ALKPHOS 43  BILITOT 1.3*  PROT 5.7*  ALBUMIN 2.9*   No results for input(s): "LIPASE", "AMYLASE" in the last 168 hours. No results for input(s): "AMMONIA" in the last 168 hours. CBC: Recent Labs  Lab  12/20/21 0545 12/21/21 0654  WBC 15.1* 14.7*  HGB 10.5* 11.4*  HCT 31.6* 34.0*  MCV 94.3 93.4  PLT 458* 464*   Cardiac Enzymes: No results for input(s): "CKTOTAL", "CKMB", "CKMBINDEX", "TROPONINI" in the last 168 hours. BNP: BNP (last 3 results) Recent Labs    11/16/21 2036 12/09/21 1013 12/13/21 1410  BNP 600.3* 401.6* 228.6*    ProBNP (last 3 results) No results for input(s): "PROBNP" in the last 8760 hours.  CBG: No results for input(s): "GLUCAP" in the last 168 hours.     Signed:  Desma Maxim MD.  [facility] 12/24/2021, 11:00 AM

## 2021-12-27 ENCOUNTER — Telehealth: Payer: Self-pay

## 2021-12-27 DIAGNOSIS — E878 Other disorders of electrolyte and fluid balance, not elsewhere classified: Secondary | ICD-10-CM | POA: Diagnosis not present

## 2021-12-27 DIAGNOSIS — M6281 Muscle weakness (generalized): Secondary | ICD-10-CM | POA: Diagnosis not present

## 2021-12-27 DIAGNOSIS — J1282 Pneumonia due to coronavirus disease 2019: Secondary | ICD-10-CM | POA: Diagnosis not present

## 2021-12-27 DIAGNOSIS — Z86718 Personal history of other venous thrombosis and embolism: Secondary | ICD-10-CM | POA: Diagnosis not present

## 2021-12-27 NOTE — Telephone Encounter (Signed)
Transition Care Management Unsuccessful Follow-up Telephone Call  Date of discharge and from where:  TCM DC [facility] 12-24-21 Dx: Acute hypoxemic respiratory failure due to COVID-19  Attempts:  1st Attempt  Reason for unsuccessful TCM follow-up call:  Left voice message   Juanda Crumble LPN [place] Direct Dial [phone]   Transition Care Management Unsuccessful Follow-up Telephone Call  Date of discharge and from where:   TCM DC [facility] 12-24-21 Dx: Acute hypoxemic respiratory failure due to COVID-19     Attempts:  2nd Attempt  Reason for unsuccessful TCM follow-up call:  Left voice message   Juanda Crumble LPN [place] Direct Dial [phone]  Per husband the patient is at [facility] in [place] for rehab- no TCM note needed

## 2021-12-28 ENCOUNTER — Telehealth: Payer: Self-pay

## 2021-12-28 DIAGNOSIS — G629 Polyneuropathy, unspecified: Secondary | ICD-10-CM | POA: Diagnosis not present

## 2021-12-28 DIAGNOSIS — K219 Gastro-esophageal reflux disease without esophagitis: Secondary | ICD-10-CM | POA: Diagnosis not present

## 2021-12-28 DIAGNOSIS — M1990 Unspecified osteoarthritis, unspecified site: Secondary | ICD-10-CM | POA: Diagnosis not present

## 2021-12-28 DIAGNOSIS — I10 Essential (primary) hypertension: Secondary | ICD-10-CM | POA: Diagnosis not present

## 2021-12-28 NOTE — Telephone Encounter (Signed)
Patient's husband, Yuliana Vandrunen, called to state patient has moved from hospital to [facility] in [place].

## 2021-12-29 NOTE — Telephone Encounter (Signed)
Called - notified - thanks for update and let us know if needs anything.

## 2021-12-31 DIAGNOSIS — K649 Unspecified hemorrhoids: Secondary | ICD-10-CM | POA: Diagnosis not present

## 2021-12-31 DIAGNOSIS — K5900 Constipation, unspecified: Secondary | ICD-10-CM | POA: Diagnosis not present

## 2022-01-04 DIAGNOSIS — I480 Paroxysmal atrial fibrillation: Secondary | ICD-10-CM | POA: Diagnosis not present

## 2022-01-04 DIAGNOSIS — M7981 Nontraumatic hematoma of soft tissue: Secondary | ICD-10-CM | POA: Diagnosis not present

## 2022-01-04 DIAGNOSIS — L03115 Cellulitis of right lower limb: Secondary | ICD-10-CM | POA: Diagnosis not present

## 2022-01-07 DIAGNOSIS — L03115 Cellulitis of right lower limb: Secondary | ICD-10-CM | POA: Diagnosis not present

## 2022-01-10 DIAGNOSIS — L03115 Cellulitis of right lower limb: Secondary | ICD-10-CM | POA: Diagnosis not present

## 2022-01-10 NOTE — Progress Notes (Deleted)
   Patient ID: [name], female    DOB: 28-Dec-1928, 88 y.o.   MRN: [id number]  HPI  [name] is a 88 y/o female with a history of  Echo report from 11/17/21 reviewed and showed an EF of 50-55% along with mild LVH and mild/moderate MR, TR, AR.   Admitted 12/13/21 due to SOB. Hypoxic on room air. Found to have covid pneumonia. Palliative care consult obtained. VQ scan also with high probability of PE, no DVT on dopplers. Started on xarelto, would plan on minimum 3 months treatment. PT/OT evaluations done. Discharged after 11 days. Admitted 12/09/21 due to                           Discharged after 2 days.   She presents today for her initial visit with a chief complaint of   Review of Systems    Physical Exam    Assessment & Plan:  1: Chronic heart failure with preserved ejection fraction with structural changes (LVH)- - NYHA  - on GDMT of

## 2022-01-11 ENCOUNTER — Ambulatory Visit: Payer: Medicare HMO | Admitting: Family

## 2022-01-13 DIAGNOSIS — M7981 Nontraumatic hematoma of soft tissue: Secondary | ICD-10-CM | POA: Diagnosis not present

## 2022-01-13 DIAGNOSIS — L03115 Cellulitis of right lower limb: Secondary | ICD-10-CM | POA: Diagnosis not present

## 2022-01-16 ENCOUNTER — Other Ambulatory Visit: Payer: Self-pay | Admitting: Internal Medicine

## 2022-01-18 DIAGNOSIS — M7981 Nontraumatic hematoma of soft tissue: Secondary | ICD-10-CM | POA: Diagnosis not present

## 2022-01-18 DIAGNOSIS — L03115 Cellulitis of right lower limb: Secondary | ICD-10-CM | POA: Diagnosis not present

## 2022-01-20 ENCOUNTER — Ambulatory Visit: Payer: Medicare HMO | Admitting: Family

## 2022-01-25 DIAGNOSIS — Z86718 Personal history of other venous thrombosis and embolism: Secondary | ICD-10-CM | POA: Diagnosis not present

## 2022-01-25 DIAGNOSIS — M6281 Muscle weakness (generalized): Secondary | ICD-10-CM | POA: Diagnosis not present

## 2022-01-25 DIAGNOSIS — J1282 Pneumonia due to coronavirus disease 2019: Secondary | ICD-10-CM | POA: Diagnosis not present

## 2022-01-25 DIAGNOSIS — I10 Essential (primary) hypertension: Secondary | ICD-10-CM | POA: Diagnosis not present

## 2022-01-28 DIAGNOSIS — M6281 Muscle weakness (generalized): Secondary | ICD-10-CM | POA: Diagnosis not present

## 2022-01-28 DIAGNOSIS — I10 Essential (primary) hypertension: Secondary | ICD-10-CM | POA: Diagnosis not present

## 2022-01-28 DIAGNOSIS — J1282 Pneumonia due to coronavirus disease 2019: Secondary | ICD-10-CM | POA: Diagnosis not present

## 2022-01-28 DIAGNOSIS — Z86718 Personal history of other venous thrombosis and embolism: Secondary | ICD-10-CM | POA: Diagnosis not present

## 2022-02-01 ENCOUNTER — Telehealth: Payer: Self-pay

## 2022-02-08 ENCOUNTER — Telehealth: Payer: Self-pay | Admitting: Internal Medicine

## 2022-02-08 NOTE — Telephone Encounter (Signed)
LM for pt to cb 

## 2022-02-08 NOTE — Telephone Encounter (Signed)
Pt husband would like to be called regarding a hoyer lift being ordered

## 2022-02-09 ENCOUNTER — Other Ambulatory Visit: Payer: Self-pay

## 2022-02-09 NOTE — Telephone Encounter (Signed)
Pt returning call

## 2022-02-09 NOTE — Telephone Encounter (Signed)
Pt advised Dr Nicki Reaper not in office until next week. Order will be sent when dr returns

## 2022-02-15 NOTE — Telephone Encounter (Signed)
I am ok with DME order for hoyer lift (and tray?).  Please let husband know.

## 2022-02-15 NOTE — Telephone Encounter (Signed)
Patient's husband is following up on request for hoyer lift.  I let him know that Dr. [name] will return to the office tomorrow.  Morghan Kester states patient will also need an over-the-bed tray.  Joneen Caraway asked [place] to please call him if that will be a problem.

## 2022-02-16 ENCOUNTER — Other Ambulatory Visit: Payer: Self-pay

## 2022-02-16 DIAGNOSIS — I4891 Unspecified atrial fibrillation: Secondary | ICD-10-CM

## 2022-02-16 DIAGNOSIS — R531 Weakness: Secondary | ICD-10-CM

## 2022-02-16 DIAGNOSIS — G8929 Other chronic pain: Secondary | ICD-10-CM

## 2022-02-16 NOTE — Telephone Encounter (Signed)
Pt husband advised orders faxed to [employer]

## 2022-02-16 NOTE — Telephone Encounter (Signed)
Pt husband wants to be called

## 2022-02-19 ENCOUNTER — Other Ambulatory Visit: Payer: Self-pay

## 2022-02-19 ENCOUNTER — Inpatient Hospital Stay
Admission: EM | Admit: 2022-02-19 | Discharge: 2022-02-23 | DRG: 193 | Disposition: A | Discharge status: Home Health | Payer: Medicare HMO | Attending: Internal Medicine | Admitting: Internal Medicine

## 2022-02-19 ENCOUNTER — Observation Stay: Payer: Medicare HMO

## 2022-02-19 ENCOUNTER — Emergency Department: Payer: Medicare HMO

## 2022-02-19 DIAGNOSIS — J440 Chronic obstructive pulmonary disease with acute lower respiratory infection: Secondary | ICD-10-CM | POA: Diagnosis not present

## 2022-02-19 DIAGNOSIS — D6832 Hemorrhagic disorder due to extrinsic circulating anticoagulants: Secondary | ICD-10-CM | POA: Diagnosis present

## 2022-02-19 DIAGNOSIS — I5032 Chronic diastolic (congestive) heart failure: Secondary | ICD-10-CM | POA: Diagnosis present

## 2022-02-19 DIAGNOSIS — Z743 Need for continuous supervision: Secondary | ICD-10-CM | POA: Diagnosis not present

## 2022-02-19 DIAGNOSIS — J189 Pneumonia, unspecified organism: Secondary | ICD-10-CM | POA: Diagnosis not present

## 2022-02-19 DIAGNOSIS — Z833 Family history of diabetes mellitus: Secondary | ICD-10-CM

## 2022-02-19 DIAGNOSIS — Z86711 Personal history of pulmonary embolism: Secondary | ICD-10-CM

## 2022-02-19 DIAGNOSIS — Z8616 Personal history of COVID-19: Secondary | ICD-10-CM | POA: Diagnosis not present

## 2022-02-19 DIAGNOSIS — Z9049 Acquired absence of other specified parts of digestive tract: Secondary | ICD-10-CM

## 2022-02-19 DIAGNOSIS — Z825 Family history of asthma and other chronic lower respiratory diseases: Secondary | ICD-10-CM

## 2022-02-19 DIAGNOSIS — R918 Other nonspecific abnormal finding of lung field: Secondary | ICD-10-CM | POA: Diagnosis not present

## 2022-02-19 DIAGNOSIS — I5030 Unspecified diastolic (congestive) heart failure: Secondary | ICD-10-CM

## 2022-02-19 DIAGNOSIS — Z7189 Other specified counseling: Secondary | ICD-10-CM | POA: Diagnosis not present

## 2022-02-19 DIAGNOSIS — E7800 Pure hypercholesterolemia, unspecified: Secondary | ICD-10-CM | POA: Diagnosis present

## 2022-02-19 DIAGNOSIS — R0602 Shortness of breath: Secondary | ICD-10-CM | POA: Diagnosis not present

## 2022-02-19 DIAGNOSIS — I10 Essential (primary) hypertension: Secondary | ICD-10-CM | POA: Diagnosis not present

## 2022-02-19 DIAGNOSIS — K219 Gastro-esophageal reflux disease without esophagitis: Secondary | ICD-10-CM | POA: Diagnosis present

## 2022-02-19 DIAGNOSIS — Z1152 Encounter for screening for COVID-19: Secondary | ICD-10-CM | POA: Diagnosis not present

## 2022-02-19 DIAGNOSIS — Z7901 Long term (current) use of anticoagulants: Secondary | ICD-10-CM | POA: Diagnosis not present

## 2022-02-19 DIAGNOSIS — I482 Chronic atrial fibrillation, unspecified: Secondary | ICD-10-CM

## 2022-02-19 DIAGNOSIS — J90 Pleural effusion, not elsewhere classified: Secondary | ICD-10-CM | POA: Diagnosis not present

## 2022-02-19 DIAGNOSIS — Z79899 Other long term (current) drug therapy: Secondary | ICD-10-CM | POA: Diagnosis not present

## 2022-02-19 DIAGNOSIS — N182 Chronic kidney disease, stage 2 (mild): Secondary | ICD-10-CM | POA: Diagnosis present

## 2022-02-19 DIAGNOSIS — M419 Scoliosis, unspecified: Secondary | ICD-10-CM | POA: Diagnosis present

## 2022-02-19 DIAGNOSIS — I959 Hypotension, unspecified: Secondary | ICD-10-CM | POA: Diagnosis not present

## 2022-02-19 DIAGNOSIS — Z85828 Personal history of other malignant neoplasm of skin: Secondary | ICD-10-CM

## 2022-02-19 DIAGNOSIS — Z823 Family history of stroke: Secondary | ICD-10-CM

## 2022-02-19 DIAGNOSIS — J1008 Influenza due to other identified influenza virus with other specified pneumonia: Principal | ICD-10-CM | POA: Diagnosis present

## 2022-02-19 DIAGNOSIS — R0689 Other abnormalities of breathing: Secondary | ICD-10-CM | POA: Diagnosis not present

## 2022-02-19 DIAGNOSIS — R0902 Hypoxemia: Secondary | ICD-10-CM | POA: Diagnosis not present

## 2022-02-19 DIAGNOSIS — S41111A Laceration without foreign body of right upper arm, initial encounter: Secondary | ICD-10-CM | POA: Diagnosis present

## 2022-02-19 DIAGNOSIS — Z8042 Family history of malignant neoplasm of prostate: Secondary | ICD-10-CM

## 2022-02-19 DIAGNOSIS — Z8701 Personal history of pneumonia (recurrent): Secondary | ICD-10-CM

## 2022-02-19 DIAGNOSIS — E876 Hypokalemia: Secondary | ICD-10-CM | POA: Diagnosis present

## 2022-02-19 DIAGNOSIS — Z993 Dependence on wheelchair: Secondary | ICD-10-CM

## 2022-02-19 DIAGNOSIS — Z85038 Personal history of other malignant neoplasm of large intestine: Secondary | ICD-10-CM

## 2022-02-19 DIAGNOSIS — Z66 Do not resuscitate: Secondary | ICD-10-CM | POA: Diagnosis not present

## 2022-02-19 DIAGNOSIS — T45515A Adverse effect of anticoagulants, initial encounter: Secondary | ICD-10-CM | POA: Diagnosis present

## 2022-02-19 DIAGNOSIS — J9601 Acute respiratory failure with hypoxia: Secondary | ICD-10-CM | POA: Diagnosis present

## 2022-02-19 DIAGNOSIS — K449 Diaphragmatic hernia without obstruction or gangrene: Secondary | ICD-10-CM | POA: Diagnosis not present

## 2022-02-19 DIAGNOSIS — J159 Unspecified bacterial pneumonia: Secondary | ICD-10-CM | POA: Diagnosis present

## 2022-02-19 DIAGNOSIS — Z803 Family history of malignant neoplasm of breast: Secondary | ICD-10-CM

## 2022-02-19 DIAGNOSIS — Z83438 Family history of other disorder of lipoprotein metabolism and other lipidemia: Secondary | ICD-10-CM

## 2022-02-19 DIAGNOSIS — E871 Hypo-osmolality and hyponatremia: Secondary | ICD-10-CM | POA: Diagnosis not present

## 2022-02-19 DIAGNOSIS — R531 Weakness: Secondary | ICD-10-CM | POA: Diagnosis not present

## 2022-02-19 DIAGNOSIS — I480 Paroxysmal atrial fibrillation: Secondary | ICD-10-CM | POA: Diagnosis present

## 2022-02-19 DIAGNOSIS — I4891 Unspecified atrial fibrillation: Secondary | ICD-10-CM | POA: Diagnosis not present

## 2022-02-19 DIAGNOSIS — Z515 Encounter for palliative care: Secondary | ICD-10-CM | POA: Diagnosis not present

## 2022-02-19 DIAGNOSIS — J101 Influenza due to other identified influenza virus with other respiratory manifestations: Secondary | ICD-10-CM | POA: Diagnosis not present

## 2022-02-19 DIAGNOSIS — I130 Hypertensive heart and chronic kidney disease with heart failure and stage 1 through stage 4 chronic kidney disease, or unspecified chronic kidney disease: Secondary | ICD-10-CM | POA: Diagnosis not present

## 2022-02-19 DIAGNOSIS — Z8041 Family history of malignant neoplasm of ovary: Secondary | ICD-10-CM

## 2022-02-19 DIAGNOSIS — Z8249 Family history of ischemic heart disease and other diseases of the circulatory system: Secondary | ICD-10-CM

## 2022-02-19 DIAGNOSIS — Z9221 Personal history of antineoplastic chemotherapy: Secondary | ICD-10-CM

## 2022-02-19 DIAGNOSIS — I2699 Other pulmonary embolism without acute cor pulmonale: Secondary | ICD-10-CM

## 2022-02-19 DIAGNOSIS — Z885 Allergy status to narcotic agent status: Secondary | ICD-10-CM

## 2022-02-19 LAB — BLOOD GAS, VENOUS
Acid-Base Excess: 5.5 mmol/L — ABNORMAL HIGH (ref 0.0–2.0)
Bicarbonate: 31.6 mmol/L — ABNORMAL HIGH (ref 20.0–28.0)
O2 Saturation: 63.8 %
Patient temperature: 37
pCO2, Ven: 51 mmHg (ref 44–60)
pH, Ven: 7.4 (ref 7.25–7.43)
pO2, Ven: 41 mmHg (ref 32–45)

## 2022-02-19 LAB — COMPREHENSIVE METABOLIC PANEL
ALT: 33 U/L (ref 0–44)
AST: 37 U/L (ref 15–41)
Albumin: 3.3 g/dL — ABNORMAL LOW (ref 3.5–5.0)
Alkaline Phosphatase: 66 U/L (ref 38–126)
Anion gap: 9 (ref 5–15)
BUN: 20 mg/dL (ref 8–23)
CO2: 25 mmol/L (ref 22–32)
Calcium: 8.4 mg/dL — ABNORMAL LOW (ref 8.9–10.3)
Chloride: 99 mmol/L (ref 98–111)
Creatinine, Ser: 0.95 mg/dL (ref 0.44–1.00)
GFR, Estimated: 57 mL/min — ABNORMAL LOW (ref >60)
Glucose, Bld: 171 mg/dL — ABNORMAL HIGH (ref 70–99)
Potassium: 3.1 mmol/L — ABNORMAL LOW (ref 3.5–5.1)
Sodium: 133 mmol/L — ABNORMAL LOW (ref 135–145)
Total Bilirubin: 1.2 mg/dL (ref 0.3–1.2)
Total Protein: 6.6 g/dL (ref 6.5–8.1)

## 2022-02-19 LAB — CBC WITH DIFFERENTIAL/PLATELET
Abs Immature Granulocytes: 0.04 10*3/uL (ref 0.00–0.07)
Basophils Absolute: 0 10*3/uL (ref 0.0–0.1)
Basophils Relative: 0 %
Eosinophils Absolute: 0 10*3/uL (ref 0.0–0.5)
Eosinophils Relative: 0 %
HCT: 35 % — ABNORMAL LOW (ref 36.0–46.0)
Hemoglobin: 10.7 g/dL — ABNORMAL LOW (ref 12.0–15.0)
Immature Granulocytes: 1 %
Lymphocytes Relative: 20 %
Lymphs Abs: 1.6 10*3/uL (ref 0.7–4.0)
MCH: 28.9 pg (ref 26.0–34.0)
MCHC: 30.6 g/dL (ref 30.0–36.0)
MCV: 94.6 fL (ref 80.0–100.0)
Monocytes Absolute: 0.6 10*3/uL (ref 0.1–1.0)
Monocytes Relative: 8 %
Neutro Abs: 5.9 10*3/uL (ref 1.7–7.7)
Neutrophils Relative %: 71 %
Platelets: 344 10*3/uL (ref 150–400)
RBC: 3.7 MIL/uL — ABNORMAL LOW (ref 3.87–5.11)
RDW: 14 % (ref 11.5–15.5)
WBC: 8.2 10*3/uL (ref 4.0–10.5)
nRBC: 0 % (ref 0.0–0.2)

## 2022-02-19 LAB — RESP PANEL BY RT-PCR (RSV, FLU A&B, COVID)  RVPGX2
Influenza A by PCR: NEGATIVE
Influenza B by PCR: POSITIVE — AB
Resp Syncytial Virus by PCR: NEGATIVE
SARS Coronavirus 2 by RT PCR: NEGATIVE

## 2022-02-19 LAB — PROCALCITONIN: Procalcitonin: <0.1 ng/mL

## 2022-02-19 LAB — BRAIN NATRIURETIC PEPTIDE: B Natriuretic Peptide: 418.1 pg/mL — ABNORMAL HIGH (ref 0.0–100.0)

## 2022-02-19 MED ORDER — METOCLOPRAMIDE HCL 5 MG PO TABS
5.0000 mg | ORAL_TABLET | Freq: Two times a day (BID) | ORAL | Status: DC
  Administered 2022-02-19 – 2022-02-23 (×8): 5 mg via ORAL
  Filled 2022-02-19 (×8): qty 1

## 2022-02-19 MED ORDER — ALBUTEROL SULFATE (2.5 MG/3ML) 0.083% IN NEBU
INHALATION_SOLUTION | RESPIRATORY_TRACT | Status: AC
  Filled 2022-02-19: qty 6

## 2022-02-19 MED ORDER — SODIUM CHLORIDE 0.9 % IV SOLN
2.0000 g | INTRAVENOUS | Status: DC
  Administered 2022-02-19 – 2022-02-21 (×3): 2 g via INTRAVENOUS
  Filled 2022-02-19: qty 2
  Filled 2022-02-19: qty 20
  Filled 2022-02-19: qty 2

## 2022-02-19 MED ORDER — METHYLPREDNISOLONE SODIUM SUCC 125 MG IJ SOLR
125.0000 mg | INTRAMUSCULAR | Status: AC
  Administered 2022-02-19: 125 mg via INTRAVENOUS
  Filled 2022-02-19: qty 2

## 2022-02-19 MED ORDER — PREDNISONE 20 MG PO TABS
40.0000 mg | ORAL_TABLET | Freq: Every day | ORAL | Status: DC
  Administered 2022-02-20: 40 mg via ORAL
  Filled 2022-02-19: qty 2

## 2022-02-19 MED ORDER — AMLODIPINE BESYLATE 5 MG PO TABS
5.0000 mg | ORAL_TABLET | Freq: Every day | ORAL | Status: DC
  Administered 2022-02-20 – 2022-02-23 (×4): 5 mg via ORAL
  Filled 2022-02-19 (×4): qty 1

## 2022-02-19 MED ORDER — IRBESARTAN 150 MG PO TABS
300.0000 mg | ORAL_TABLET | Freq: Every day | ORAL | Status: DC
  Administered 2022-02-20 – 2022-02-23 (×4): 300 mg via ORAL
  Filled 2022-02-19 (×4): qty 2

## 2022-02-19 MED ORDER — SIMVASTATIN 20 MG PO TABS
10.0000 mg | ORAL_TABLET | Freq: Every day | ORAL | Status: DC
  Administered 2022-02-20 – 2022-02-22 (×3): 10 mg via ORAL
  Filled 2022-02-19 (×3): qty 1

## 2022-02-19 MED ORDER — FUROSEMIDE 40 MG PO TABS
40.0000 mg | ORAL_TABLET | Freq: Every day | ORAL | Status: DC
  Administered 2022-02-20 – 2022-02-23 (×4): 40 mg via ORAL
  Filled 2022-02-19 (×4): qty 1

## 2022-02-19 MED ORDER — APIXABAN 5 MG PO TABS
5.0000 mg | ORAL_TABLET | Freq: Two times a day (BID) | ORAL | Status: DC
  Administered 2022-02-19 – 2022-02-23 (×8): 5 mg via ORAL
  Filled 2022-02-19 (×8): qty 1

## 2022-02-19 MED ORDER — IPRATROPIUM-ALBUTEROL 0.5-2.5 (3) MG/3ML IN SOLN
RESPIRATORY_TRACT | Status: AC
  Filled 2022-02-19: qty 3

## 2022-02-19 MED ORDER — POTASSIUM CHLORIDE 20 MEQ PO PACK
40.0000 meq | PACK | Freq: Once | ORAL | Status: AC
  Administered 2022-02-19: 40 meq via ORAL
  Filled 2022-02-19: qty 2

## 2022-02-19 MED ORDER — METOPROLOL SUCCINATE ER 25 MG PO TB24
25.0000 mg | ORAL_TABLET | Freq: Two times a day (BID) | ORAL | Status: DC
  Administered 2022-02-19 – 2022-02-23 (×8): 25 mg via ORAL
  Filled 2022-02-19 (×8): qty 1

## 2022-02-19 MED ORDER — SODIUM CHLORIDE 0.9 % IV SOLN
500.0000 mg | INTRAVENOUS | Status: DC
  Administered 2022-02-19 – 2022-02-21 (×3): 500 mg via INTRAVENOUS
  Filled 2022-02-19 (×3): qty 5

## 2022-02-19 MED ORDER — MAGNESIUM SULFATE 2 GM/50ML IV SOLN
2.0000 g | INTRAVENOUS | Status: AC
  Administered 2022-02-19: 2 g via INTRAVENOUS
  Filled 2022-02-19: qty 50

## 2022-02-19 MED ORDER — IPRATROPIUM-ALBUTEROL 0.5-2.5 (3) MG/3ML IN SOLN
3.0000 mL | Freq: Four times a day (QID) | RESPIRATORY_TRACT | Status: DC
  Administered 2022-02-19 – 2022-02-20 (×3): 3 mL via RESPIRATORY_TRACT
  Filled 2022-02-19 (×3): qty 3

## 2022-02-19 NOTE — ED Notes (Signed)
This RN introduced myself to the pt and family at bedside. Pt and family deny any needs at this time.

## 2022-02-19 NOTE — ED Provider Notes (Addendum)
[facility] Provider Note    Event Date/Time   First MD Initiated Contact with Patient 02/19/22 1510     (approximate)   History   Chief Complaint: Shortness of breath  HPI  [name] is a 89 y.o. female with a past medical history of colon cancer, diverticulitis, hypertension, GERD, CHF, atrial fibrillation on Xarelto, COPD who was brought to the ED by EMS due to shortness of breath for the past week.  Associated with nonproductive cough, malaise, generalized weakness.  Denies any pain.  No syncope.  Patient reports her doctor started her on steroids 4 days ago, no abx. Reviewing outside records, I see that a refill for lasix was recently processed.      Physical Exam   Triage Vital Signs: ED Triage Vitals  Enc Vitals Group     BP      Pulse      Resp      Temp      Temp src      SpO2      Weight      Height      Head Circumference      Peak Flow      Pain Score      Pain Loc      Pain Edu?      Excl. in [place]?     Most recent vital signs: Vitals:   02/19/22 1630 02/19/22 1700  BP: (!) 135/55 (!) 125/45  Pulse: 100 89  Resp: (!) 22 (!) 21  Temp:  98 F (36.7 C)  SpO2: 96% 95%    General: Awake, moderate respiratory distress CV:  Good peripheral perfusion. Regular rate. Normal distal pulses Resp:  Increased WOB. Diffuse expiratory wheezing. Diminished BS R base, b/l basilar crackles Abd:  No distention. Soft nt Other:  No LE edema. No rash. Chronic discoloration of RLE.    ED Results / Procedures / Treatments   Labs (all labs ordered are listed, but only abnormal results are displayed) Labs Reviewed  RESP PANEL BY RT-PCR (RSV, FLU A&B, COVID)  RVPGX2 - Abnormal; Notable for the following components:      Result Value   Influenza B by PCR POSITIVE (*)    All other components within normal limits  COMPREHENSIVE METABOLIC PANEL - Abnormal; Notable for the following components:   Sodium 133 (*)    Potassium 3.1 (*)     Glucose, Bld 171 (*)    Calcium 8.4 (*)    Albumin 3.3 (*)    GFR, Estimated 57 (*)    All other components within normal limits  BRAIN NATRIURETIC PEPTIDE - Abnormal; Notable for the following components:   B Natriuretic Peptide 418.1 (*)    All other components within normal limits  CBC WITH DIFFERENTIAL/PLATELET - Abnormal; Notable for the following components:   RBC 3.70 (*)    Hemoglobin 10.7 (*)    HCT 35.0 (*)    All other components within normal limits  BLOOD GAS, VENOUS - Abnormal; Notable for the following components:   Bicarbonate 31.6 (*)    Acid-Base Excess 5.5 (*)    All other components within normal limits  PROCALCITONIN     EKG    RADIOLOGY Chest x-ray interpreted by me, small right pleural effusion.  Small haziness at left base.  No lobar consolidation.  No frank pulmonary edema.   PROCEDURES:  .1-3 Lead EKG Interpretation  Performed by: Carrie Mew, MD Authorized by: Carrie Mew, MD  Interpretation: normal     ECG rate:  80   ECG rate assessment: normal     Rhythm: sinus rhythm     Ectopy: none     Conduction: normal   Comments:        .Critical Care  Performed by: Carrie Mew, MD Authorized by: Carrie Mew, MD   Critical care provider statement:    Critical care time (minutes):  35   Critical care time was exclusive of:  Separately billable procedures and treating other patients   Critical care was necessary to treat or prevent imminent or life-threatening deterioration of the following conditions:  Respiratory failure   Critical care was time spent personally by me on the following activities:  Development of treatment plan with patient or surrogate, discussions with consultants, evaluation of patient's response to treatment, examination of patient, obtaining history from patient or surrogate, ordering and performing treatments and interventions, ordering and review of laboratory studies, ordering and review of  radiographic studies, pulse oximetry, re-evaluation of patient's condition, review of old charts and vascular access procedures   Care discussed with: admitting provider   Comments:          MEDICATIONS ORDERED IN ED: Medications  magnesium sulfate IVPB 2 g 50 mL (2 g Intravenous New Bag/Given 02/19/22 1633)  methylPREDNISolone sodium succinate (SOLU-MEDROL) 125 mg/2 mL injection 125 mg (125 mg Intravenous Given 02/19/22 1631)  albuterol (PROVENTIL) (2.5 MG/3ML) 0.083% nebulizer solution (  Given 02/19/22 1541)  ipratropium-albuterol (DUONEB) 0.5-2.5 (3) MG/3ML nebulizer solution (  Given 02/19/22 1541)     IMPRESSION / MDM / ASSESSMENT AND PLAN / ED COURSE  I reviewed the triage vital signs and the nursing notes.                              Differential diagnosis includes, but is not limited to, COPD exacerbation, PNA, pleural effusion, ptx, pulm edema / CHF, viral illness, anemia, hypoalbuminemia, electrolyte abnormality, AKI. Doubt ACS or PE.  Patient's presentation is most consistent with acute presentation with potential threat to life or bodily function.  Pt p/w prfound hypoxia, now requiring 4 L . Will obtain labs, CXR, given bronchodialators, solumedrol, magnesium IV.  Doubt sepsis on initial presentation.    Clinical Course as of 02/19/22 1718  Sat Feb 19, 2022  1633 PIV placed by me. Plan d/w family at bedside [PS]    Clinical Course User Index [PS] Carrie Mew, MD    ----------------------------------------- 5:18 PM on 02/19/2022 ----------------------------------------- Influenza B positive.  Procalcitonin negative.  Antibiotics not needed at this time.  Will discuss with hospitalist.   FINAL CLINICAL IMPRESSION(S) / ED DIAGNOSES   Final diagnoses:  Acute respiratory failure with hypoxia ([place])  Chronic atrial fibrillation ([place])  Influenza B     Rx / DC Orders   ED Discharge Orders     None        Note:  This document was prepared using Dragon  voice recognition software and may include unintentional dictation errors.   Carrie Mew, MD 02/19/22 1719    Carrie Mew, MD 02/19/22 [phone]

## 2022-02-19 NOTE — Assessment & Plan Note (Addendum)
BNP elevated today at 400, however per chart review, patient ranges between 200-400 frequently.  No evidence of frank hypervolemia on examination.  At this time, will hold off on IV Lasix and resume home p.o. Lasix.  - Continue home Lasix

## 2022-02-19 NOTE — Assessment & Plan Note (Addendum)
In the setting of influenza B.  Patient is out of the window for Tamiflu.  On examination, patient is tachypneic and quite rhonchorous despite chest x-ray only demonstrating a left lower lobe opacity.  Procalcitonin is negative, will obtain a CT chest.  Given high risk for decompensation, will start bacterial CAP coverage for now.  History of clinically diagnosed asthma and uses a albuterol inhaler at home.  BNP is elevated, but within the normal range the patient is typically in with no evidence of hypervolemia on exam  - Continue supplemental oxygen to maintain oxygen saturation above 88% - Wean as tolerated - CT chest pending - Start ceftriaxone and azithromycin - Continue DuoNebs every 6 hours - Start prednisone 40 mg tomorrow - Given high risk for decompensation, will have an order for BiPAP as needed

## 2022-02-19 NOTE — ED Notes (Signed)
RN dressed skin tear with normal saline soaked gauze and kerlex. MD Joni Fears notified patient needs USG IV. Pt still audibly wheezing on expiration. Pt states she "can't tell if the breathing treatments have been helping".

## 2022-02-19 NOTE — H&P (Addendum)
History and Physical    Patient: [name] ZWC:[id number] DOB: 1928-07-09 DOA: 02/19/2022 DOS: the patient was seen and examined on 02/19/2022 PCP: [name], MD  Patient coming from: Home  Chief Complaint:  Chief Complaint  Patient presents with   Shortness of Breath   HPI: [name] is a 89 y.o. female with medical history significant of recent PE on Xarelto (October/2023), paroxysmal atrial fibrillation, hypertension, hyperlipidemia, HFpEF with last EF of 50-55%, CKD, who presents to the ED with shortness of breath.  History obtained from patient and both daughters at bedside.  On 02/15/2022, patient nonproductive cough.  She then began to experience shortness of breath that progressively worsened.  She had a televisit with her PCPs office who started her on prednisone and cough suppressant.  Once she started these, patient felt that she was improving but then cough and SOB worsened yesterday and today. Cough has become productive at times.  She denies any fever, chills, nausea, vomiting, diarrhea, abdominal pain.  She has been bruising very easily but this has been ongoing since starting Xarelto.  Per EMS, patient was saturating at 70% on room air on their arrival.  She was placed on 4 L of supplemental oxygen with improvement in her oxygen saturation into the 90s.  ED course: On arrival to the ED, patient was saturating at 92% on 4 L supplemental oxygen.  She was hypertensive at 151/49 with heart rate of 105.  She was tachypneic at 21 breaths/min.  Initial workup remarkable for sodium of 133, potassium of 3.1, glucose of 171, and GFR 57.  Influenza B PCR positive.  CBC with WBC of 8.2 and hemoglobin of 10.7.  BNP mildly elevated at 418.  Procalcitonin negative at below 0.10.  Due to acute hypoxic respiratory failure in the setting of influenza, [facility] contacted for admission.  Review of Systems: As mentioned in the history of present illness. All other systems reviewed and are  negative.  Past Medical History:  Diagnosis Date   Anemia    Barrett's esophagus    BCC (basal cell carcinoma of skin) 03/07/2013   vertex scalp - NODULAR NODULAR PATTERN PATTERN   Bowel obstruction (HCC)    s/p adhesion resection   CHF (congestive heart failure) (HCC)    Chronic back pain    Colon cancer ([place]) 1988 and 1989   adenocarcinoma, s/p resection x  2 and chemo   Diverticulitis    GERD (gastroesophageal reflux disease)    H/O open leg wound    Hiatal hernia    Hypercholesteremia    Hypertension    Lumbar scoliosis    OA (osteoarthritis)    Peripheral neuropathy    Recurrent sinus infections    Renal cyst    S/P chemotherapy, time since greater than 12 weeks    colon cancer   Vertigo    Past Surgical History:  Procedure Laterality Date   ABDOMINAL HYSTERECTOMY  1982   adhesions resected     bowel obstruction   APPENDECTOMY     BREAST BIOPSY Left    negative 06/14/1985   CHOLECYSTECTOMY  1994   COLON RESECTION     x2.  s/p colon cancer   COLON SURGERY  1958 and [place]   EXCISIONAL HEMORRHOIDECTOMY     with tubal ligation   EXPLORATORY LAPAROTOMY  1991   Secondary to SBO   Social History:  reports that she has never smoked. She has never used smokeless tobacco. She  reports that she does not drink alcohol and does not use drugs.  Allergies  Allergen Reactions   Fentanyl Other (See Comments)    confusion    Family History  Problem Relation Age of Onset   Breast cancer Mother    Asthma Mother    Stroke Father    Hypertension Father    Diabetes Father    Ovarian cancer Sister        x2   Prostate cancer Brother    Hypertension Brother        x3   Heart disease Brother    Hypercholesterolemia Brother        x3   Diabetes Brother    Spina bifida Grandchild    Hematuria Neg Hx    Kidney cancer Neg Hx    Kidney disease Neg Hx    Sickle cell trait Neg Hx    Tuberculosis Neg Hx     Prior to Admission medications   Medication  Sig Start Date End Date Taking? Authorizing Provider  acetaminophen (TYLENOL) 500 MG tablet Take 500 mg by mouth every 6 (six) hours as needed.    [provider]  albuterol (VENTOLIN HFA) 108 (90 Base) MCG/ACT inhaler TAKE 2 PUFFS BY MOUTH EVERY 6 HOURS AS NEEDED FOR WHEEZE OR SHORTNESS OF BREATH 11/12/21   [name], MD  amLODipine (NORVASC) 5 MG tablet TAKE 1 TABLET EVERY DAY 11/29/21   [name], MD  calcium carbonate (OSCAL) 1500 (600 Ca) MG TABS tablet Take 600 mg of elemental calcium by mouth 2 (two) times daily with a meal.    [provider]  Cholecalciferol (VITAMIN D3) 50 MCG (2000 UT) capsule Take 2,000 Units by mouth daily.    [provider]  diclofenac Sodium (VOLTAREN) 1 % GEL Apply 2 g topically 3 (three) times daily. 11/20/21   Fritzi Mandes, MD  docusate sodium (COLACE) 100 MG capsule Take 100 mg by mouth 2 (two) times daily.    [provider]  fexofenadine (ALLEGRA) 60 MG tablet TAKE 1 TABLET EVERY DAY 08/29/21   Dutch Quint B, FNP  furosemide (LASIX) 20 MG tablet Take 1 tablet (20 mg total) by mouth daily. Patient taking differently: Take 40 mg by mouth daily. 12/11/21 01/10/22  Max Sane, MD  gabapentin (NEURONTIN) 100 MG capsule TAKE 2 CAPSULES AT BEDTIME 05/18/20   [name], MD  metoCLOPramide (REGLAN) 5 MG tablet Take 5 mg by mouth 2 (two) times daily. 11/29/21   [provider]  metoprolol succinate (TOPROL-XL) 25 MG 24 hr tablet TAKE 1 TABLET TWICE DAILY 01/16/22   [name], MD  ondansetron (ZOFRAN ODT) 4 MG disintegrating tablet Take 1 tablet (4 mg total) by mouth 2 (two) times daily as needed for nausea or vomiting. 10/29/19   [name], MD  pantoprazole (PROTONIX) 40 MG tablet Take 40 mg by mouth 2 (two) times daily. 11/29/21   [provider]  Rivaroxaban (XARELTO) 15 MG TABS tablet Take 1 tablet (15 mg total) by mouth 2 (two) times daily with a meal. 12/24/21   Wouk, Ailene Rud, MD   rivaroxaban (XARELTO) 20 MG TABS tablet Take 1 tablet (20 mg total) by mouth daily with supper. 01/04/22   Wouk, Ailene Rud, MD  simvastatin (ZOCOR) 10 MG tablet TAKE 1 TABLET (10 MG TOTAL) AT BEDTIME. 02/23/21   [name], MD  [employer] Josiah Lobo ([place]) [place]  06/26/19   [provider]    Physical Exam: Vitals:   02/19/22 1538  02/19/22 1600 02/19/22 1630 02/19/22 1700  BP:  (!) 151/49 (!) 135/55 (!) 125/45  Pulse:  (!) 105 100 89  Resp:  (!) 21 (!) 22 (!) 21  Temp:    98 F (36.7 C)  TempSrc:      SpO2:  92% 96% 95%  Weight: 72.3 kg     Height: '5\' 2"'$  (1.575 m)      Physical Exam Vitals and nursing note reviewed.  Constitutional:      Appearance: She is normal weight. She is ill-appearing.  HENT:     Head: Normocephalic and atraumatic.     Mouth/Throat:     Mouth: Mucous membranes are moist.     Pharynx: Oropharynx is clear.  Eyes:     Extraocular Movements: Extraocular movements intact.     Pupils: Pupils are equal, round, and reactive to light.  Cardiovascular:     Rate and Rhythm: Normal rate. Rhythm irregular.     Heart sounds: No murmur heard. Pulmonary:     Effort: Tachypnea present. No respiratory distress.     Breath sounds: Wheezing (Occasional expiratory wheeze) and rhonchi (Diffuse rhonchi throughout) present. No decreased breath sounds or rales.  Musculoskeletal:     Cervical back: Normal range of motion.     Right lower leg: No tenderness. No edema.     Left lower leg: No tenderness. No edema.  Skin:    General: Skin is warm and dry.     Findings: Ecchymosis (Diffuse ecchymosis on bilateral upper and lower extremities, but worst on the right lower extremity in various stages of healing.) present.  Neurological:     General: No focal deficit present.     Mental Status: She is alert and oriented to person, place, and time.     Motor: No weakness.  Psychiatric:        Mood and Affect: Mood normal.        Behavior:  Behavior normal.    Data Reviewed: CBC with WBC of 8.2, hemoglobin of 10.7, platelets of 344 CMP with sodium of 133, potassium 3.1, bicarb 25, glucose of 171, BUN of 20, creatinine 0.95, calcium 8.4, GFR calculated to be 57.  LFTs within normal limits with AST 37 ALT of 33 BNP elevated at 418 Procalcitonin negative at less than 0.10 Influenza B PCR positive.  RSV and COVID-19 PCR negative.  DG Chest Portable 1 View  Result Date: 02/19/2022 CLINICAL DATA:  Short of breath, hypoxia EXAM: PORTABLE CHEST 1 VIEW COMPARISON:  12/13/2021 FINDINGS: Single frontal view of the chest demonstrates a stable cardiac silhouette. Large hiatal hernia. Increased density in the retrocardiac region may reflect consolidation and/or effusion. Trace right pleural effusion. Nodular shadows projecting over the right anterior second rib and third anterior intercostal space likely reflect vessels seen on and. No pneumothorax. No acute bony abnormality. IMPRESSION: 1. Increased density at the left lung base, which may reflect consolidation and/or effusion. 2. Trace right pleural effusion. 3. Stable enlarged cardiac silhouette. 4. Hiatal hernia. Electronically Signed   By: Randa Ngo M.D.   On: 02/19/2022 16:00    There are no new results to review at this time.  Assessment and Plan:  * Acute hypoxic respiratory failure (HCC) In the setting of influenza B.  Patient is out of the window for Tamiflu.  On examination, patient is tachypneic and quite rhonchorous despite chest x-ray only demonstrating a left lower lobe opacity.  Procalcitonin is negative, will obtain a CT chest.  Given high risk for  decompensation, will start bacterial CAP coverage for now.  History of clinically diagnosed asthma and uses a albuterol inhaler at home.  BNP is elevated, but within the normal range the patient is typically in with no evidence of hypervolemia on exam  - Continue supplemental oxygen to maintain oxygen saturation above 88% - Wean  as tolerated - CT chest pending - Start ceftriaxone and azithromycin - Continue DuoNebs every 6 hours - Start prednisone 40 mg tomorrow - Given high risk for decompensation, will have an order for BiPAP as needed  Pulmonary embolism ([place]) Patient has a history of PE in the setting of COVID-19 pneumonia in October 2023.  At this time, she is hypoxic but no tachycardia.  Symptoms can be explained by patient's influenza B and current presentation would be atypical for recurrent PE since she is compliant with her blood thinners.  - Continue home Eliquis  (HFpEF) heart failure with preserved ejection fraction (HCC) BNP elevated today at 400, however per chart review, patient ranges between 200-400 frequently.  No evidence of frank hypervolemia on examination.  At this time, will hold off on IV Lasix and resume home p.o. Lasix.  - Continue home Lasix  Paroxysmal atrial fibrillation (HCC) - Continue home Eliquis and metoprolol  Hyponatremia Patient has a history of intermittent hyponatremia, that is mild today in nature.  Will continue to monitor while admitted.  -Daily BMP  Essential hypertension - Continue home amlodipine and irbesartan  Advance Care Planning:   Code Status: Full Code.  Patient previously DNR/DNI and signed a Gold form stating this.  When asked about CODE STATUS today, she states that now that she has been hospitalized a few times, she would actually like resuscitative efforts to be attempted.  She states that she would like to live longer.  Her daughters at bedside were surprised by this but would like to respect her wishes.  Patient expresses understanding of the decision she is making.  Consults: None  Family Communication: Patient's daughters updated at bedside  Severity of Illness: The appropriate patient status for this patient is OBSERVATION. Observation status is judged to be reasonable and necessary in order to provide the required intensity of service to  ensure the patient's safety. The patient's presenting symptoms, physical exam findings, and initial radiographic and laboratory data in the context of their medical condition is felt to place them at decreased risk for further clinical deterioration. Furthermore, it is anticipated that the patient will be medically stable for discharge from the hospital within 2 midnights of admission.   Author: Jose Persia, MD 02/19/2022 7:05 PM  For on call review [email or web address].

## 2022-02-19 NOTE — Assessment & Plan Note (Addendum)
Patient has a history of PE in the setting of COVID-19 pneumonia in October 2023.  At this time, she is hypoxic but no tachycardia.  Symptoms can be explained by patient's influenza B and current presentation would be atypical for recurrent PE since she is compliant with her blood thinners.  - Continue home Eliquis

## 2022-02-19 NOTE — Assessment & Plan Note (Addendum)
-   Continue home amlodipine and irbesartan

## 2022-02-19 NOTE — ED Notes (Signed)
Patient transported to CT 

## 2022-02-19 NOTE — Assessment & Plan Note (Signed)
Patient has a history of intermittent hyponatremia, that is mild today in nature.  Will continue to monitor while admitted.  -Daily BMP

## 2022-02-19 NOTE — ED Triage Notes (Signed)
Pt arrived via EMS for shortness of breath. Pt states she's been sick since Tuesday with poss PNA. Pt states she has been coughing a lot but not coughing anything up. Pt denies CP. Pt states she wears oxygen at home when needed. Upon EMS arrival pt 02 saturation on room air was 70%. Pt place on 4L [place]. Pt has audible expiratory wheezes with abdominal muscle use. Pt received a duoneb tx in route. Pt has noted skin tears to R forearm and legs.

## 2022-02-19 NOTE — Assessment & Plan Note (Addendum)
-   Continue home Eliquis and metoprolol

## 2022-02-20 ENCOUNTER — Encounter: Payer: Self-pay | Admitting: Hospitalist

## 2022-02-20 DIAGNOSIS — R0602 Shortness of breath: Secondary | ICD-10-CM | POA: Diagnosis present

## 2022-02-20 DIAGNOSIS — E7800 Pure hypercholesterolemia, unspecified: Secondary | ICD-10-CM | POA: Diagnosis present

## 2022-02-20 DIAGNOSIS — I480 Paroxysmal atrial fibrillation: Secondary | ICD-10-CM | POA: Diagnosis present

## 2022-02-20 DIAGNOSIS — Z515 Encounter for palliative care: Secondary | ICD-10-CM | POA: Diagnosis not present

## 2022-02-20 DIAGNOSIS — Z86711 Personal history of pulmonary embolism: Secondary | ICD-10-CM | POA: Diagnosis not present

## 2022-02-20 DIAGNOSIS — Z1152 Encounter for screening for COVID-19: Secondary | ICD-10-CM | POA: Diagnosis not present

## 2022-02-20 DIAGNOSIS — S41111A Laceration without foreign body of right upper arm, initial encounter: Secondary | ICD-10-CM | POA: Diagnosis present

## 2022-02-20 DIAGNOSIS — Z85038 Personal history of other malignant neoplasm of large intestine: Secondary | ICD-10-CM | POA: Diagnosis not present

## 2022-02-20 DIAGNOSIS — E871 Hypo-osmolality and hyponatremia: Secondary | ICD-10-CM | POA: Diagnosis present

## 2022-02-20 DIAGNOSIS — D6832 Hemorrhagic disorder due to extrinsic circulating anticoagulants: Secondary | ICD-10-CM | POA: Diagnosis present

## 2022-02-20 DIAGNOSIS — Z7901 Long term (current) use of anticoagulants: Secondary | ICD-10-CM | POA: Diagnosis not present

## 2022-02-20 DIAGNOSIS — E876 Hypokalemia: Secondary | ICD-10-CM | POA: Diagnosis present

## 2022-02-20 DIAGNOSIS — J159 Unspecified bacterial pneumonia: Secondary | ICD-10-CM | POA: Diagnosis present

## 2022-02-20 DIAGNOSIS — J9601 Acute respiratory failure with hypoxia: Secondary | ICD-10-CM | POA: Diagnosis present

## 2022-02-20 DIAGNOSIS — Z85828 Personal history of other malignant neoplasm of skin: Secondary | ICD-10-CM | POA: Diagnosis not present

## 2022-02-20 DIAGNOSIS — Z66 Do not resuscitate: Secondary | ICD-10-CM | POA: Diagnosis present

## 2022-02-20 DIAGNOSIS — M419 Scoliosis, unspecified: Secondary | ICD-10-CM | POA: Diagnosis present

## 2022-02-20 DIAGNOSIS — K219 Gastro-esophageal reflux disease without esophagitis: Secondary | ICD-10-CM | POA: Diagnosis present

## 2022-02-20 DIAGNOSIS — I5032 Chronic diastolic (congestive) heart failure: Secondary | ICD-10-CM | POA: Diagnosis present

## 2022-02-20 DIAGNOSIS — Z8616 Personal history of COVID-19: Secondary | ICD-10-CM | POA: Diagnosis not present

## 2022-02-20 DIAGNOSIS — J1008 Influenza due to other identified influenza virus with other specified pneumonia: Secondary | ICD-10-CM | POA: Diagnosis present

## 2022-02-20 DIAGNOSIS — N182 Chronic kidney disease, stage 2 (mild): Secondary | ICD-10-CM | POA: Diagnosis present

## 2022-02-20 DIAGNOSIS — Z79899 Other long term (current) drug therapy: Secondary | ICD-10-CM | POA: Diagnosis not present

## 2022-02-20 DIAGNOSIS — I130 Hypertensive heart and chronic kidney disease with heart failure and stage 1 through stage 4 chronic kidney disease, or unspecified chronic kidney disease: Secondary | ICD-10-CM | POA: Diagnosis present

## 2022-02-20 DIAGNOSIS — J440 Chronic obstructive pulmonary disease with acute lower respiratory infection: Secondary | ICD-10-CM | POA: Diagnosis present

## 2022-02-20 DIAGNOSIS — Z7189 Other specified counseling: Secondary | ICD-10-CM | POA: Diagnosis not present

## 2022-02-20 LAB — CBC WITH DIFFERENTIAL/PLATELET
Abs Immature Granulocytes: 0.05 10*3/uL (ref 0.00–0.07)
Basophils Absolute: 0 10*3/uL (ref 0.0–0.1)
Basophils Relative: 0 %
Eosinophils Absolute: 0 10*3/uL (ref 0.0–0.5)
Eosinophils Relative: 0 %
HCT: 30.2 % — ABNORMAL LOW (ref 36.0–46.0)
Hemoglobin: 9.5 g/dL — ABNORMAL LOW (ref 12.0–15.0)
Immature Granulocytes: 1 %
Lymphocytes Relative: 7 %
Lymphs Abs: 0.4 10*3/uL — ABNORMAL LOW (ref 0.7–4.0)
MCH: 29.5 pg (ref 26.0–34.0)
MCHC: 31.5 g/dL (ref 30.0–36.0)
MCV: 93.8 fL (ref 80.0–100.0)
Monocytes Absolute: 0.3 10*3/uL (ref 0.1–1.0)
Monocytes Relative: 5 %
Neutro Abs: 5.3 10*3/uL (ref 1.7–7.7)
Neutrophils Relative %: 87 %
Platelets: 292 10*3/uL (ref 150–400)
RBC: 3.22 MIL/uL — ABNORMAL LOW (ref 3.87–5.11)
RDW: 14.2 % (ref 11.5–15.5)
WBC: 6 10*3/uL (ref 4.0–10.5)
nRBC: 0 % (ref 0.0–0.2)

## 2022-02-20 LAB — STREP PNEUMONIAE URINARY ANTIGEN: Strep Pneumo Urinary Antigen: NEGATIVE

## 2022-02-20 LAB — BASIC METABOLIC PANEL
Anion gap: 10 (ref 5–15)
BUN: 19 mg/dL (ref 8–23)
CO2: 25 mmol/L (ref 22–32)
Calcium: 8.2 mg/dL — ABNORMAL LOW (ref 8.9–10.3)
Chloride: 101 mmol/L (ref 98–111)
Creatinine, Ser: 0.84 mg/dL (ref 0.44–1.00)
GFR, Estimated: >60 mL/min (ref >60)
Glucose, Bld: 189 mg/dL — ABNORMAL HIGH (ref 70–99)
Potassium: 3.5 mmol/L (ref 3.5–5.1)
Sodium: 136 mmol/L (ref 135–145)

## 2022-02-20 LAB — HIV ANTIBODY (ROUTINE TESTING W REFLEX): HIV Screen 4th Generation wRfx: NONREACTIVE

## 2022-02-20 LAB — GLUCOSE, CAPILLARY: Glucose-Capillary: 278 mg/dL — ABNORMAL HIGH (ref 70–99)

## 2022-02-20 LAB — MAGNESIUM: Magnesium: 2.6 mg/dL — ABNORMAL HIGH (ref 1.7–2.4)

## 2022-02-20 MED ORDER — ACETYLCYSTEINE 20 % IN SOLN
4.0000 mL | Freq: Two times a day (BID) | RESPIRATORY_TRACT | Status: DC
  Administered 2022-02-20 – 2022-02-23 (×6): 4 mL via RESPIRATORY_TRACT
  Filled 2022-02-20 (×8): qty 4

## 2022-02-20 MED ORDER — IPRATROPIUM-ALBUTEROL 0.5-2.5 (3) MG/3ML IN SOLN
3.0000 mL | Freq: Four times a day (QID) | RESPIRATORY_TRACT | Status: DC
  Administered 2022-02-20 – 2022-02-23 (×10): 3 mL via RESPIRATORY_TRACT
  Filled 2022-02-20 (×11): qty 3

## 2022-02-20 MED ORDER — ZINC OXIDE 40 % EX OINT: TOPICAL_OINTMENT | CUTANEOUS | Status: DC | PRN

## 2022-02-20 NOTE — ED Notes (Signed)
Advised nurse that patient has ready bed 

## 2022-02-20 NOTE — Progress Notes (Signed)
  PROGRESS NOTE    [name]  HER:[id number] DOB: 27-Mar-1928 DOA: 02/19/2022 PCP: [name], MD  108A/108A-AA  LOS: 0 days   Brief hospital course:   Assessment & Plan: [name] is a 89 y.o. female with medical history significant of recent PE on Xarelto (October/2023), paroxysmal atrial fibrillation, hypertension, hyperlipidemia, HFpEF with last EF of 50-55%, CKD, who presents to the ED with shortness of breath.    * Acute hypoxic respiratory failure ([place]) --Per EMS, patient was saturating at 70% on room air on their arrival. She was placed on 4 L of supplemental oxygen with improvement in her oxygen saturation into the 90s.  --cause is influenza B, but given high risk for decompensation, pt was started on abx for empiric coverage. --Continue supplemental O2 to keep sats >=90%, wean as tolerated  Flu B --Patient is out of the window for Tamiflu.  Started on prednisone on admission. Plan: --d/c prednisone --cont DuoNeb QID --cont empiric ceftriaxone and azithro for now --start mucomyst neb BID for mucus clearance  Hx of Pulmonary embolism ([place]) Patient has a history of PE in the setting of COVID-19 pneumonia in October 2023.   --cont Eliquis  Chronic heart failure with preserved ejection fraction (HCC) BNP elevated today at 400, however per chart review, patient ranges between 200-400 frequently.  No evidence of frank hypervolemia on examination.   - Continue home Lasix  Paroxysmal atrial fibrillation (HCC) - Continue home Eliquis and metoprolol  Hyponatremia, resolved Patient has a history of intermittent hyponatremia, that was mild on presentation.  Essential hypertension - Continue home amlodipine, irbesartan, lasix and Toprol.   DVT prophylaxis: [id number] Code Status: Full code  Family Communication: daughter updated at bedside today Level of care: Med-Surg Dispo:   The patient is from: home Anticipated d/c is to: home Anticipated d/c date is: 1-2  days   Subjective and Interval History:  Pt reported feeling better.     Objective: Vitals:   02/20/22 1100 02/20/22 1443 02/20/22 1546 02/20/22 1558  BP: 134/70 (!) 145/86  (!) 141/67  Pulse: 78 83  72  Resp: '12 20  16  '$ Temp: 97.7 F (36.5 C) 99.6 F (37.6 C)  97.7 F (36.5 C)  TempSrc:      SpO2: 97% 99% 97% 100%  Weight:      Height:        Intake/Output Summary (Last 24 hours) at 02/20/2022 1733 Last data filed at 02/19/2022 2034 Gross per 24 hour  Intake 350 ml  Output --  Net 350 ml   Filed Weights   02/19/22 1538  Weight: 72.3 kg    Examination:   Constitutional: NAD, alert HEENT: conjunctivae and lids normal, EOMI CV: No cyanosis.   RESP: diffuse rhonchi, phlegmy  SKIN: warm, dry, extensive bruising.  Skin tear to right forearm wrapped. Neuro: II - XII grossly intact.   Psych: Normal mood and affect.     Data Reviewed: I have personally reviewed labs and imaging studies  Time spent: 50 minutes  Enzo Bi, MD [facility] If 7PM-7AM, please contact night-coverage 02/20/2022, 5:33 PM

## 2022-02-20 NOTE — ED Notes (Signed)
Pt had a skin tear to her R arm with a dressing on and pt's daughter found that the wound was bleeding through the dressing. New dressing placed and new gown placed on pt.

## 2022-02-21 DIAGNOSIS — J9601 Acute respiratory failure with hypoxia: Secondary | ICD-10-CM | POA: Diagnosis not present

## 2022-02-21 LAB — BASIC METABOLIC PANEL
Anion gap: 10 (ref 5–15)
BUN: 19 mg/dL (ref 8–23)
CO2: 24 mmol/L (ref 22–32)
Calcium: 8 mg/dL — ABNORMAL LOW (ref 8.9–10.3)
Chloride: 102 mmol/L (ref 98–111)
Creatinine, Ser: 0.73 mg/dL (ref 0.44–1.00)
GFR, Estimated: >60 mL/min (ref >60)
Glucose, Bld: 105 mg/dL — ABNORMAL HIGH (ref 70–99)
Potassium: 3.4 mmol/L — ABNORMAL LOW (ref 3.5–5.1)
Sodium: 136 mmol/L (ref 135–145)

## 2022-02-21 LAB — CBC
HCT: 27.8 % — ABNORMAL LOW (ref 36.0–46.0)
Hemoglobin: 8.9 g/dL — ABNORMAL LOW (ref 12.0–15.0)
MCH: 30 pg (ref 26.0–34.0)
MCHC: 32 g/dL (ref 30.0–36.0)
MCV: 93.6 fL (ref 80.0–100.0)
Platelets: 321 10*3/uL (ref 150–400)
RBC: 2.97 MIL/uL — ABNORMAL LOW (ref 3.87–5.11)
RDW: 14.1 % (ref 11.5–15.5)
WBC: 12.2 10*3/uL — ABNORMAL HIGH (ref 4.0–10.5)
nRBC: 0 % (ref 0.0–0.2)

## 2022-02-21 LAB — MAGNESIUM: Magnesium: 2.4 mg/dL (ref 1.7–2.4)

## 2022-02-21 MED ORDER — POTASSIUM CHLORIDE 20 MEQ PO PACK
40.0000 meq | PACK | Freq: Once | ORAL | Status: AC
  Administered 2022-02-21: 40 meq via ORAL
  Filled 2022-02-21: qty 2

## 2022-02-21 MED ORDER — SILVER NITRATE-POT NITRATE 75-25 % EX MISC
1.0000 | Freq: Once | CUTANEOUS | Status: AC
  Administered 2022-02-21: 1 via TOPICAL
  Filled 2022-02-21: qty 1

## 2022-02-21 MED ORDER — GABAPENTIN 100 MG PO CAPS
200.0000 mg | ORAL_CAPSULE | Freq: Every day | ORAL | Status: DC
  Administered 2022-02-21 – 2022-02-22 (×2): 200 mg via ORAL
  Filled 2022-02-21 (×2): qty 2

## 2022-02-21 MED ORDER — PANTOPRAZOLE SODIUM 40 MG PO TBEC
40.0000 mg | DELAYED_RELEASE_TABLET | Freq: Two times a day (BID) | ORAL | Status: DC
  Administered 2022-02-21 – 2022-02-23 (×4): 40 mg via ORAL
  Filled 2022-02-21 (×4): qty 1

## 2022-02-21 NOTE — Consult Note (Signed)
WOC Nurse Consult Note: Reason for Consult:skin tear, full thickness to right forearm Wound type:trauma Pressure Injury POA: N/A Measurement:To be measured by Bedside RN with application of next dressing today and documented on Nursing Flow Sheet. Drainage (amount, consistency, odor) serosanguinous Periwound: ecchymotic Dressing procedure/placement/frequency:Guidance for the care of this skin tear is provided for Nursing using the standing order skin care order set for skin tears and will consist of daily cleansing with NS followed by the placement of a silicone wound contact layer (Mepitel) topped with xeroform gauze dry gauze, and ABD pad and secured with Kerlix roll gauze/paper tape. No tape is to be applied tot he skin.  Pressure injury prevention measures are initiated and include but are not limited to placement of a sacral silicone foam, turning and repositioning to minimize time in the supine position, and floatation of heels. DermaTherapy low friction coefficient (CoF) bed linen system is in use.  [place] nursing team will not follow, but will remain available to this patient, the nursing and medical teams.  Please re-consult if needed.  Thank you for inviting [place] to participate in this patient's Plan of Care.  Maudie Flakes, MSN, RN, CNS, [place], Serita Grammes, [employer], [employer] phone:  [phone]

## 2022-02-21 NOTE — Consult Note (Signed)
WOC Nurse Consult Note: Reason for Consult: serosanguinous exudate in moderate amount from left forearm skin tear. Wound type: trauma  Bedside RN K. Davis contact this Probation officer via [employer] to report that serosanguinous exudate continues in a moderate amount. I will add a calcium alginate absorbant dressing that will enhance coagulation in place of the previously ordered xeroform gauze. Wound care now to be:  Cleanse with NS, pat dry.  Top with silicone wound contact layer (Mepitel) cover with calcium alginate top with dry gauze, ABD pad and  secure with a few turns of Kerlix roll gauze/paper tape. No tape is to be applied to skin. Change daily and PRN drainage strike through.  [place] nursing team will not follow, but will remain available to this patient, the nursing and medical teams.  Please re-consult if needed.  Thank you for inviting [place] to participate in this patient's Plan of Care.  Maudie Flakes, MSN, RN, CNS, [place], Serita Grammes, [employer], [employer] phone:  [phone]

## 2022-02-21 NOTE — TOC Initial Note (Signed)
Transition of Care [facility]) - Initial/Assessment Note    Patient Details  Name: [name] MRN: [id number] Date of Birth: April 08, 1928  [facility] CM/SW Contact:    Gerilyn Pilgrim, LCSW Phone Number: 02/21/2022, 9:56 AM  Clinical Narrative:   SW spoke with daughter Mare Ferrari. Jenny Reichmann states that pt has a hospital bed and a 3in1 at home. Jenny Reichmann states that a hoyer lift was ordered through the patients PCP's office but states she doesn't think it has been delivered yet. Jenny Reichmann is unsure which agency she got the DME through but states to follow up with [name] who normally handles this.Jenny Reichmann states they also have a lift recliner for the patient.  Jenny Reichmann reports she is interested in palliative care for the patient. She reports that they private pay an aide who comes daily from 10-1 and then from 5:30-7:30pm. Jenny Reichmann states they were previously in with [facility] and would like this to resume. SW will send message to [employer] representative.                 Expected Discharge Plan: [place] Barriers to Discharge: Continued Medical Work up   Patient Goals and CMS Choice   CMS Medicare.gov Compare Post Acute Care list provided to:: Patient Represenative (must comment) (daughter amy)        Expected Discharge Plan and Services     Post Acute Care Choice: [place] arrangements for the past 2 months: [place]                 DME Arranged: 3-N-1, Hospital bed           [place]: [place]        Prior Living Arrangements/Services Living arrangements for the past 2 months: [place] Lives with:: Spouse Patient language and need for interpreter reviewed:: Yes Do you feel safe going back to the place where you live?: Yes      Need for Family Participation in Patient Care: Yes (Comment) Care giver support system in place?: Yes (comment) Current home services: DME, Home OT, Home PT, Home RN Criminal Activity/Legal  Involvement Pertinent to Current Situation/Hospitalization: No - Comment as needed  Activities of Daily Living Home Assistive Devices/Equipment: Wheelchair, [employer] fast streamer (hospital bed) ADL Screening (condition at time of admission) Patient's cognitive ability adequate to safely complete daily activities?: No Is the patient deaf or have difficulty hearing?: Yes Does the patient have difficulty seeing, even when wearing glasses/contacts?: Yes Does the patient have difficulty concentrating, remembering, or making decisions?: Yes Patient able to express need for assistance with ADLs?: Yes Does the patient have difficulty dressing or bathing?: Yes Independently performs ADLs?: No Communication: Needs assistance Is this a change from baseline?: Pre-admission baseline Dressing (OT): Needs assistance Is this a change from baseline?: Pre-admission baseline Grooming: Needs assistance Is this a change from baseline?: Pre-admission baseline Feeding: Independent Bathing: Needs assistance Is this a change from baseline?: Pre-admission baseline Toileting: Needs assistance Is this a change from baseline?: Pre-admission baseline In/Out Bed: Needs assistance Is this a change from baseline?: Pre-admission baseline Walks in Home: Dependent Is this a change from baseline?: Pre-admission baseline Does the patient have difficulty walking or climbing stairs?: No Weakness of Legs: Both Weakness of Arms/Hands: None  Permission Sought/Granted      Share Information with NAME: daughters           Emotional Assessment  Admission diagnosis:  Influenza B [J10.1] Chronic atrial fibrillation ([place]) [I48.20] CAP (community acquired pneumonia) [J18.9] Acute respiratory failure with hypoxia ([place]) [J96.01] Patient Active Problem List   Diagnosis Date Noted   Acute hypoxic respiratory failure ([place]) 02/19/2022   (HFpEF) heart failure with preserved ejection fraction ([place]) 02/19/2022   SOB  (shortness of breath) 12/14/2021   Pulmonary embolism ([place]) 12/14/2021   Shortness of breath 12/13/2021   Pneumonia due to COVID-19 virus 12/13/2021   Hyperkalemia 12/13/2021   Acute hypoxemic respiratory failure due to COVID-19 ([place]) 12/13/2021   Hyponatremia 12/13/2021   At risk for polypharmacy 12/13/2021   Acute on chronic congestive heart failure (HCC)    Acute on chronic diastolic heart failure ([place]) 12/09/2021   Paroxysmal atrial fibrillation ([place]) 11/16/2021   Acute anemia 11/16/2021   Acute respiratory failure with hypoxia ([place]) 11/16/2021   Elevated temperature 11/16/2021   Aortic atherosclerosis ([place]) 08/02/2020   Fall 12/29/2019   Weakness 06/09/2019   Wheezing 04/16/2019   Back skin lesion 12/04/2018   Restless legs syndrome 11/25/2018   PAD (peripheral artery disease) ([place]) 04/07/2018   Cold foot 05/15/2017   Constipation 08/15/2016   AKI (acute kidney injury) ([place]) 04/13/2016   Small bowel obstruction ([place]) 11/24/2015   Swelling of left lower extremity 07/16/2015   Acute Ileitis 06/12/2015   Hypokalemia 06/12/2015   Accelerated hypertension 06/12/2015   Hereditary hemochromatosis ([place]) 05/25/2015   Open leg wound 05/19/2015   Lower extremity edema 05/19/2015   Iron excess 04/16/2015   Dizziness 10/26/2014   Hyperbilirubinemia 10/26/2014   Health care maintenance 05/18/2014   Anemia, iron deficiency 04/21/2014   Hospital discharge follow-up 04/14/2014   Hot flashes 03/07/2014   Face lesion 11/03/2013   Numbness in feet 10/08/2012   Nocturia 10/08/2012   Essential hypertension 12/18/2011   Hypercholesteremia 12/18/2011   History of colon cancer 12/18/2011   Barrett's esophagus 12/18/2011   Chronic back pain 12/18/2011   Osteopenia 12/18/2011   PCP:  [name], MD Pharmacy:   [facility] [place], [address] [facility] [place] [address] Phone: [phone] Fax: [phone]     Social  Determinants of Health (SDOH) Social History: SDOH Screenings   Food Insecurity: No Food Insecurity (02/20/2022)  Housing: Low Risk  (02/20/2022)  Transportation Needs: No Transportation Needs (02/20/2022)  Utilities: Not At Risk (02/20/2022)  Depression (PHQ2-9): Low Risk  (11/30/2020)  Financial Resource Strain: Low Risk  (11/30/2020)  Physical Activity: Unknown (11/30/2020)  Social Connections: Unknown (11/30/2020)  Stress: No Stress Concern Present (11/30/2020)  Tobacco Use: Low Risk  (02/20/2022)   SDOH Interventions:     Readmission Risk Interventions    02/21/2022    9:54 AM  Readmission Risk Prevention Plan  Transportation Screening Complete  HRI or Home Care Consult Complete  Palliative Care Screening Complete

## 2022-02-21 NOTE — Progress Notes (Signed)
  PROGRESS NOTE    Katrina BONIFACE  [name]:[id number] DOB: 1928-12-07 DOA: 02/19/2022 PCP: [name], MD  108A/108A-AA  LOS: 1 day   Brief hospital course:   Assessment & Plan: [name] is a 89 y.o. female with medical history significant of recent PE on Xarelto (October/2023), paroxysmal atrial fibrillation, hypertension, hyperlipidemia, HFpEF with last EF of 50-55%, CKD, who presents to the ED with shortness of breath.    * Acute hypoxic respiratory failure ([place]) --Per EMS, patient was saturating at 70% on room air on their arrival. She was placed on 4 L of supplemental oxygen with improvement in her oxygen saturation into the 90s.  --cause is influenza B, but given high risk for decompensation, pt was started on abx for empiric coverage. --currently on 2L --Continue supplemental O2 to keep sats >=90%, wean as tolerated  Flu B --Patient is out of the window for Tamiflu.  Started on prednisone on admission, since d/c'ed Plan: --cont DuoNeb QID --cont empiric ceftriaxone and azithro for now --cont mucomyst neb BID for mucus clearance  Hx of Pulmonary embolism ([place]) Patient has a history of PE in the setting of COVID-19 pneumonia in October 2023.   --cont Eliquis  Chronic heart failure with preserved ejection fraction (HCC) BNP elevated today at 400, however per chart review, patient ranges between 200-400 frequently.  No evidence of frank hypervolemia on examination.   - Continue home Lasix  Paroxysmal atrial fibrillation (HCC) - Continue home Eliquis and metoprolol  Hyponatremia, resolved Patient has a history of intermittent hyponatremia, that was mild on presentation.  Essential hypertension - Continue home amlodipine, irbesartan, lasix and Toprol.  Right arm skin tear --re-bleed easily, due to being on Eliquis --wound RN consulted --apply the silver nitrate and surgicel to the wound bed and apply a pressure dressing    DVT prophylaxis: [id number] Code  Status: Full code  Family Communication:  Level of care: Med-Surg Dispo:   The patient is from: home Anticipated d/c is to: home Anticipated d/c date is: 1-2 days   Subjective and Interval History:  Pt reported feeling fine.  RN reported right forearm skin tear with bleeding that was difficult to control.      Objective: Vitals:   02/21/22 0505 02/21/22 0752 02/21/22 1458 02/21/22 1656  BP: (!) 144/78 129/66 (!) 128/59 116/60  Pulse: 100 87 88 85  Resp: '17 17 20 18  '$ Temp: 98.4 F (36.9 C) 98.2 F (36.8 C) 97.6 F (36.4 C) 98.2 F (36.8 C)  TempSrc: Oral   Oral  SpO2: 98% 99% 96% 95%  Weight:      Height:        Intake/Output Summary (Last 24 hours) at 02/21/2022 2028 Last data filed at 02/21/2022 7858 Gross per 24 hour  Intake 240 ml  Output --  Net 240 ml   Filed Weights   02/19/22 1538  Weight: 72.3 kg    Examination:   Constitutional: NAD, alert HEENT: conjunctivae and lids normal, EOMI CV: No cyanosis.   RESP: normal respiratory effort, less rhonchi, less phlegmy, on 2L Neuro: II - XII grossly intact.     Data Reviewed: I have personally reviewed labs and imaging studies  Time spent: 35 minutes  Enzo Bi, MD [facility] If 7PM-7AM, please contact night-coverage 02/21/2022, 8:28 PM

## 2022-02-21 NOTE — TOC Progression Note (Signed)
Transition of Care [facility]) - Progression Note    Patient Details  Name: [name] MRN: [id number] Date of Birth: 27-Dec-1928  [facility] Care [facility]) CM/SW Contact  Gerilyn Pilgrim, LCSW Phone Number: 02/21/2022, 9:33 AM  Clinical Narrative:   LVM with daughter to complete readmission screen.          Expected Discharge Plan and Services                                               Social Determinants of Health (SDOH) Interventions SDOH Screenings   Food Insecurity: No Food Insecurity (02/20/2022)  Housing: Low Risk  (02/20/2022)  Transportation Needs: No Transportation Needs (02/20/2022)  Utilities: Not At Risk (02/20/2022)  Depression (PHQ2-9): Low Risk  (11/30/2020)  Financial Resource Strain: Low Risk  (11/30/2020)  Physical Activity: Unknown (11/30/2020)  Social Connections: Unknown (11/30/2020)  Stress: No Stress Concern Present (11/30/2020)  Tobacco Use: Low Risk  (02/20/2022)    Readmission Risk Interventions     No data to display

## 2022-02-22 DIAGNOSIS — Z7189 Other specified counseling: Secondary | ICD-10-CM | POA: Diagnosis not present

## 2022-02-22 DIAGNOSIS — J9601 Acute respiratory failure with hypoxia: Secondary | ICD-10-CM | POA: Diagnosis not present

## 2022-02-22 LAB — IRON AND TIBC
Iron: 28 ug/dL (ref 28–170)
Saturation Ratios: 14 % (ref 10.4–31.8)
TIBC: 195 ug/dL — ABNORMAL LOW (ref 250–450)
UIBC: 167 ug/dL

## 2022-02-22 LAB — CBC
HCT: 24.4 % — ABNORMAL LOW (ref 36.0–46.0)
Hemoglobin: 7.7 g/dL — ABNORMAL LOW (ref 12.0–15.0)
MCH: 29.2 pg (ref 26.0–34.0)
MCHC: 31.6 g/dL (ref 30.0–36.0)
MCV: 92.4 fL (ref 80.0–100.0)
Platelets: 320 10*3/uL (ref 150–400)
RBC: 2.64 MIL/uL — ABNORMAL LOW (ref 3.87–5.11)
RDW: 14.3 % (ref 11.5–15.5)
WBC: 8.4 10*3/uL (ref 4.0–10.5)
nRBC: 0 % (ref 0.0–0.2)

## 2022-02-22 LAB — FOLATE: Folate: 6 ng/mL (ref >5.9)

## 2022-02-22 LAB — BASIC METABOLIC PANEL
Anion gap: 7 (ref 5–15)
BUN: 19 mg/dL (ref 8–23)
CO2: 26 mmol/L (ref 22–32)
Calcium: 7.9 mg/dL — ABNORMAL LOW (ref 8.9–10.3)
Chloride: 101 mmol/L (ref 98–111)
Creatinine, Ser: 0.86 mg/dL (ref 0.44–1.00)
GFR, Estimated: >60 mL/min (ref >60)
Glucose, Bld: 99 mg/dL (ref 70–99)
Potassium: 3.7 mmol/L (ref 3.5–5.1)
Sodium: 134 mmol/L — ABNORMAL LOW (ref 135–145)

## 2022-02-22 LAB — LEGIONELLA PNEUMOPHILA SEROGP 1 UR AG: L. pneumophila Serogp 1 Ur Ag: NEGATIVE

## 2022-02-22 LAB — PROTIME-INR
INR: 1.5 — ABNORMAL HIGH (ref 0.8–1.2)
Prothrombin Time: 18 seconds — ABNORMAL HIGH (ref 11.4–15.2)

## 2022-02-22 LAB — MAGNESIUM: Magnesium: 2.1 mg/dL (ref 1.7–2.4)

## 2022-02-22 LAB — VITAMIN B12: Vitamin B-12: 812 pg/mL (ref 180–914)

## 2022-02-22 MED ORDER — LEVOFLOXACIN 500 MG PO TABS
250.0000 mg | ORAL_TABLET | Freq: Every day | ORAL | Status: DC
  Administered 2022-02-22: 250 mg via ORAL
  Filled 2022-02-22 (×2): qty 1

## 2022-02-22 NOTE — Consult Note (Signed)
Consultation Note Date: 02/22/2022   Patient Name: [name]  DOB: 1928/08/09  MRN: [id number]  Age / Sex: 89 y.o., female  PCP: [name], MD Referring Physician: Enzo Bi, MD  Reason for Consultation: Establishing goals of care  HPI/Patient Profile:  [name] is a 89 y.o. female with medical history significant of recent PE on Xarelto (October/2023), paroxysmal atrial fibrillation, hypertension, hyperlipidemia, HFpEF with last EF of 50-55%, CKD, who presents to the ED with shortness of breath.   Clinical Assessment and Goals of Care: Notes and labs reviewed. In to see patient. No family at bedside. Patient states she is feeling much better, and only has a nagging cough.   She states at baseline, she lives at home with her husband. She discusses her illnesses and that she was not really walking since last admission.   We discussed her diagnosis, prognosis, GOC, EOL wishes disposition and options.  Created space and opportunity for patient  to explore thoughts and feelings regarding current medical information.   A detailed discussion was had today regarding advanced directives.  Concepts specific to code status, artifical feeding and hydration, IV antibiotics and rehospitalization were discussed.  The difference between an aggressive medical intervention path and a comfort care path was discussed.  Values and goals of care important to patient and family were attempted to be elicited.  Discussed limitations of medical interventions to prolong quality of life in some situations and discussed the concept of human mortality.  Patient tells me she is 89 years old, but does not know how she feels about boundaries or limits on care. She states she does not believe she would ever want care such as CPR or ventilator support, but is not entirely sure. Discussed thoughts on acceptable vs unacceptable  QOL, and she states at this time, she is unsure of this as well, but states she will consider her wishes. Encouraged her to speak with her family about her wishes.   She states she would want her husband to be her surrogate decision maker if she becomes unable to make decisions herself.         SUMMARY OF RECOMMENDATIONS   Patient desires full code/ full scope at this time. Encouraged her to speak with her family regarding her wishes. She would want her husband to be her surrogate decision maker if she is unable to make decisions for herself.    Prognosis:  Unable to determine       Primary Diagnoses: Present on Admission:  Acute hypoxic respiratory failure ([place])  Hyponatremia  Essential hypertension  Pulmonary embolism (HCC)  Paroxysmal atrial fibrillation (HCC)  Acute respiratory failure with hypoxia ([place])   I have reviewed the medical record, interviewed the patient and family, and examined the patient. The following aspects are pertinent.  Past Medical History:  Diagnosis Date   Anemia    Barrett's esophagus    BCC (basal cell carcinoma of skin) 03/07/2013   vertex scalp - NODULAR NODULAR PATTERN PATTERN   Bowel obstruction (HCC)  s/p adhesion resection   CHF (congestive heart failure) (HCC)    Chronic back pain    Colon cancer ([place]) 1988 and 1989   adenocarcinoma, s/p resection x  2 and chemo   Diverticulitis    GERD (gastroesophageal reflux disease)    H/O open leg wound    Hiatal hernia    Hypercholesteremia    Hypertension    Lumbar scoliosis    OA (osteoarthritis)    Peripheral neuropathy    Recurrent sinus infections    Renal cyst    S/P chemotherapy, time since greater than 12 weeks    colon cancer   Vertigo    Social History   Socioeconomic History   Marital status: Married    Spouse name: Not on file   Number of children: 4   Years of education: 12th grade   Highest education level: Not on file  Occupational History   Occupation:  homemaker  Tobacco Use   Smoking status: Never   Smokeless tobacco: Never  Vaping Use   Vaping Use: Never used  Substance and Sexual Activity   Alcohol use: No    Alcohol/week: 0.0 standard drinks of alcohol   Drug use: No   Sexual activity: Not Currently  Other Topics Concern   Not on file  Social History Narrative   Not on file   Social Determinants of Health   Financial Resource Strain: Low Risk  (11/30/2020)   Overall Financial Resource Strain (CARDIA)    Difficulty of Paying Living Expenses: Not hard at all  Food Insecurity: No Food Insecurity (02/20/2022)   Hunger Vital Sign    Worried About Running Out of Food in the Last Year: Never true    Ran Out of Food in the Last Year: Never true  Transportation Needs: No Transportation Needs (02/20/2022)   PRAPARE - Hydrologist (Medical): No    Lack of Transportation (Non-Medical): No  Physical Activity: Unknown (11/30/2020)   Exercise Vital Sign    Days of Exercise per Week: 0 days    Minutes of Exercise per Session: Not on file  Stress: No Stress Concern Present (11/30/2020)   [place]    Feeling of Stress : Not at all  Social Connections: Unknown (11/30/2020)   Social Connection and Isolation Panel [NHANES]    Frequency of Communication with Friends and Family: More than three times a week    Frequency of Social Gatherings with Friends and Family: More than three times a week    Attends Religious Services: Not on Advertising copywriter or Organizations: Not on file    Attends Archivist Meetings: Not on file    Marital Status: Married   Family History  Problem Relation Age of Onset   Breast cancer Mother    Asthma Mother    Stroke Father    Hypertension Father    Diabetes Father    Ovarian cancer Sister        x2   Prostate cancer Brother    Hypertension Brother        x3   Heart disease Brother     Hypercholesterolemia Brother        x3   Diabetes Brother    Spina bifida Grandchild    Hematuria Neg Hx    Kidney cancer Neg Hx    Kidney disease Neg Hx    Sickle cell trait Neg Hx  Tuberculosis Neg Hx    Scheduled Meds:  acetylcysteine  4 mL Nebulization BID   amLODipine  5 mg Oral Daily   apixaban  5 mg Oral BID   furosemide  40 mg Oral Daily   gabapentin  200 mg Oral QHS   ipratropium-albuterol  3 mL Nebulization QID   irbesartan  300 mg Oral Daily   levofloxacin  250 mg Oral Daily   metoCLOPramide  5 mg Oral BID   metoprolol succinate  25 mg Oral BID   pantoprazole  40 mg Oral BID   simvastatin  10 mg Oral q1800   Continuous Infusions: PRN Meds:.liver oil-zinc oxide Medications Prior to Admission:  Prior to Admission medications   Medication Sig Start Date End Date Taking? Authorizing Provider  acetaminophen (TYLENOL) 500 MG tablet Take 500 mg by mouth every 6 (six) hours as needed.   Yes [provider]  albuterol (VENTOLIN HFA) 108 (90 Base) MCG/ACT inhaler TAKE 2 PUFFS BY MOUTH EVERY 6 HOURS AS NEEDED FOR WHEEZE OR SHORTNESS OF BREATH Patient taking differently: Inhale 2 puffs into the lungs every 6 (six) hours as needed for wheezing or shortness of breath. TAKE 2 PUFFS BY MOUTH EVERY 6 HOURS AS NEEDED FOR WHEEZE OR SHORTNESS OF BREATH 11/12/21  Yes [name], MD  amLODipine (NORVASC) 5 MG tablet TAKE 1 TABLET EVERY DAY 11/29/21  Yes [name], MD  apixaban (ELIQUIS) 5 MG TABS tablet Take 5 mg by mouth 2 (two) times daily. 01/31/22  Yes [provider]  calcium carbonate (OSCAL) 1500 (600 Ca) MG TABS tablet Take 600 mg of elemental calcium by mouth 2 (two) times daily with a meal.   Yes [provider]  Cholecalciferol (VITAMIN D3) 50 MCG (2000 UT) capsule Take 2,000 Units by mouth daily.   Yes [provider]  diclofenac Sodium (VOLTAREN) 1 % GEL Apply 2 g topically 3 (three) times daily. 11/20/21  Yes Fritzi Mandes, MD   docusate sodium (COLACE) 100 MG capsule Take 100 mg by mouth 2 (two) times daily.   Yes [provider]  fexofenadine (ALLEGRA) 60 MG tablet TAKE 1 TABLET EVERY DAY Patient taking differently: Take 60 mg by mouth daily. 08/29/21  Yes Dutch Quint B, FNP  furosemide (LASIX) 20 MG tablet Take 1 tablet (20 mg total) by mouth daily. 12/11/21 02/19/22 Yes Max Sane, MD  gabapentin (NEURONTIN) 100 MG capsule TAKE 2 CAPSULES AT BEDTIME Patient taking differently: Take 200 mg by mouth at bedtime. TAKE 2 CAPSULES AT BEDTIME 05/18/20  Yes [name], MD  irbesartan (AVAPRO) 300 MG tablet Take 300 mg by mouth daily. 02/03/22  Yes [provider]  methylPREDNISolone (MEDROL DOSEPAK) 4 MG TBPK tablet Take 4-24 mg by mouth See admin instructions. follow package directions 02/15/22  Yes [provider]  metoCLOPramide (REGLAN) 5 MG tablet Take 5 mg by mouth 2 (two) times daily. 11/29/21  Yes [provider]  metoprolol succinate (TOPROL-XL) 25 MG 24 hr tablet TAKE 1 TABLET TWICE DAILY Patient taking differently: Take 25 mg by mouth daily. 01/16/22  Yes [name], MD  pantoprazole (PROTONIX) 40 MG tablet Take 40 mg by mouth 2 (two) times daily. 11/29/21  Yes [provider]  simvastatin (ZOCOR) 10 MG tablet TAKE 1 TABLET (10 MG TOTAL) AT BEDTIME. Patient taking differently: Take 10 mg by mouth daily at 6 PM. 02/23/21  Yes [name], MD  ondansetron (ZOFRAN ODT) 4 MG disintegrating tablet Take 1 tablet (4 mg total) by mouth  2 (two) times daily as needed for nausea or vomiting. Patient not taking: Reported on 02/19/2022 10/29/19   [name], MD  Spacer/Aero-Holding Josiah Lobo ([place]) [place]  06/26/19   [provider]   Allergies  Allergen Reactions   Fentanyl Other (See Comments)    confusion   Review of Systems  Respiratory:  Positive for cough.     Physical Exam Pulmonary:     Effort: Pulmonary effort is normal.   Neurological:     Mental Status: She is alert.     Vital Signs: BP 124/63 (BP Location: Left Arm)   Pulse 88   Temp 97.9 F (36.6 C)   Resp 18   Ht '5\' 2"'$  (1.575 m)   Wt 72.3 kg   SpO2 95%   BMI 29.15 kg/m  Pain Scale: 0-10   Pain Score: 0-No pain   SpO2: SpO2: 95 % O2 Device:SpO2: 95 % O2 Flow Rate: .O2 Flow Rate (L/min): 2 L/min  IO: Intake/output summary: No intake or output data in the 24 hours ending 02/22/22 1436  LBM: Last BM Date : 02/21/22 Baseline Weight: Weight: 72.3 kg Most recent weight: Weight: 72.3 kg       Signed by: Asencion Gowda, NP   Please contact Palliative Medicine Team phone at [phone] for questions and concerns.  For individual provider: See Shea Evans

## 2022-02-22 NOTE — Progress Notes (Addendum)
PROGRESS NOTE    [name]  WIO:[id number] DOB: May 18, 1928 DOA: 02/19/2022 PCP: Katrina Pheasant, MD  108A/108A-AA  LOS: 2 days   Brief hospital course:   Assessment & Plan: [name] is a 89 y.o. female with medical history significant of recent PE on Xarelto (October/2023), paroxysmal atrial fibrillation, hypertension, hyperlipidemia, HFpEF with last EF of 50-55%, CKD, who presents to the ED with shortness of breath.    * Acute hypoxic respiratory failure ([place]) --Per EMS, patient was saturating at 70% on room air on their arrival. She was placed on 4 L of supplemental oxygen with improvement in her oxygen saturation into the 90s.  --cause is influenza B, but given high risk for decompensation, pt was started on abx for empiric coverage; received 3 days of ceftriaxone and azithromycin, will continue 2 more days of Levaquin. --weaned to room air today  Flu B --Patient is out of the window for Tamiflu.  Started on prednisone on admission, since d/c'ed Plan: --cont DuoNeb QID --cont mucomyst neb BID for mucus clearance  Hx of Pulmonary embolism ([name]) Patient has a history of PE in the setting of COVID-19 pneumonia in October 2023.   --cont Eliquis  Chronic heart failure with preserved ejection fraction (HCC) BNP elevated today at 400, however per chart review, patient ranges between 200-400 frequently.  No evidence of frank hypervolemia on examination.   - Continue home Lasix  Paroxysmal atrial fibrillation (HCC) - Continue home Eliquis and metoprolol  Hyponatremia, resolved Patient has a history of intermittent hyponatremia, that was mild on presentation.  Essential hypertension - Continue home amlodipine, irbesartan, lasix and Toprol.  Right arm skin tear --re-bleed easily, due to being on Eliquis --wound RN consulted --applied silver nitrate and surgicel to the wound bed and apply a pressure dressing on 1/8 by primary floor RN who used to be a wound specialist  RN. --will order [facility] RN for dressing change.  Pls discuss with wound care RN dressing instruction for [facility].  CKD 2   DVT prophylaxis: EQ:ASTMHDQ Code Status: Full code  Family Communication: daughter updated on the phone today Level of care: Med-Surg Dispo:   The patient is from: home Anticipated d/c is to: home Anticipated d/c date is: tomorrow, if Hgb stable   Subjective and Interval History:  Pt was sating well on room air today.  Dressing remained intact over her skin tear, did not appear to be bleeding.  However, Hgb trending down.   Objective: Vitals:   02/22/22 0556 02/22/22 0847 02/22/22 1700 02/22/22 2023  BP: (!) 130/55 124/63 124/65 (!) 128/59  Pulse: 81 88 88 81  Resp: '17 18 18 17  '$ Temp: 98.5 F (36.9 C) 97.9 F (36.6 C) 97.9 F (36.6 C) 97.6 F (36.4 C)  TempSrc: Oral     SpO2: (!) 89% 95% 95% 94%  Weight:      Height:       No intake or output data in the 24 hours ending 02/22/22 2117  Filed Weights   02/19/22 1538  Weight: 72.3 kg    Examination:   Constitutional: NAD, alert, oriented to person and place HEENT: conjunctivae and lids normal, EOMI CV: No cyanosis.   RESP: normal respiratory effort, on RA SKIN: warm, dry, extensive bruising Neuro: II - XII grossly intact.   Psych: Normal mood and affect.     Data Reviewed: I have personally reviewed labs and imaging studies  Time spent: 35 minutes  Katrina Bi, MD [facility] If 7PM-7AM,  please contact night-coverage 02/22/2022, 9:17 PM

## 2022-02-23 ENCOUNTER — Telehealth: Payer: Self-pay | Admitting: Internal Medicine

## 2022-02-23 DIAGNOSIS — J9601 Acute respiratory failure with hypoxia: Secondary | ICD-10-CM | POA: Diagnosis not present

## 2022-02-23 DIAGNOSIS — E871 Hypo-osmolality and hyponatremia: Secondary | ICD-10-CM | POA: Diagnosis not present

## 2022-02-23 LAB — BASIC METABOLIC PANEL
Anion gap: 9 (ref 5–15)
BUN: 22 mg/dL (ref 8–23)
CO2: 24 mmol/L (ref 22–32)
Calcium: 8.1 mg/dL — ABNORMAL LOW (ref 8.9–10.3)
Chloride: 100 mmol/L (ref 98–111)
Creatinine, Ser: 1.13 mg/dL — ABNORMAL HIGH (ref 0.44–1.00)
GFR, Estimated: 46 mL/min — ABNORMAL LOW (ref >60)
Glucose, Bld: 111 mg/dL — ABNORMAL HIGH (ref 70–99)
Potassium: 3.9 mmol/L (ref 3.5–5.1)
Sodium: 133 mmol/L — ABNORMAL LOW (ref 135–145)

## 2022-02-23 LAB — CBC
HCT: 24.8 % — ABNORMAL LOW (ref 36.0–46.0)
Hemoglobin: 7.9 g/dL — ABNORMAL LOW (ref 12.0–15.0)
MCH: 29 pg (ref 26.0–34.0)
MCHC: 31.9 g/dL (ref 30.0–36.0)
MCV: 91.2 fL (ref 80.0–100.0)
Platelets: 369 10*3/uL (ref 150–400)
RBC: 2.72 MIL/uL — ABNORMAL LOW (ref 3.87–5.11)
RDW: 14.1 % (ref 11.5–15.5)
WBC: 8.3 10*3/uL (ref 4.0–10.5)
nRBC: 0 % (ref 0.0–0.2)

## 2022-02-23 LAB — MAGNESIUM: Magnesium: 2.2 mg/dL (ref 1.7–2.4)

## 2022-02-23 MED ORDER — IPRATROPIUM-ALBUTEROL 0.5-2.5 (3) MG/3ML IN SOLN: 3.0000 mL | Freq: Two times a day (BID) | RESPIRATORY_TRACT | Status: DC

## 2022-02-23 MED ORDER — LEVOFLOXACIN 250 MG PO TABS: 250.0000 mg | ORAL_TABLET | Freq: Every day | ORAL | 0 refills | Status: AC

## 2022-02-23 NOTE — Care Management Important Message (Signed)
Important Message  Patient Details  Name: [name] MRN: [id number] Date of Birth: 26-Jun-1928   Medicare Important Message Given:  Yes     Juliann Pulse [place] 02/23/2022, 10:46 AM

## 2022-02-23 NOTE — Progress Notes (Signed)
Patient being discharged home. PIV removed. Wound care and dressing changes were done. Went over discharge instructions with daughter. Patient being transported via EMS.

## 2022-02-23 NOTE — Telephone Encounter (Signed)
[facility](nurse) called in staying that the pt was admitted at the hospital. Pt its being discharged today going home. However she called to make a hospital F/U with Dr. Nicki Reaper within a week or so but unfortunately I dint see any openings. She would like a call back to scheduled that F/U.

## 2022-02-23 NOTE — TOC Progression Note (Signed)
Transition of Care [facility]) - Progression Note    Patient Details  Name: [name] MRN: [id number] Date of Birth: 1928-06-01  [facility] of [facility] CM/SW Contact  Gerilyn Pilgrim, LCSW Phone Number: 02/23/2022, 10:09 AM  Clinical Narrative:   Damaris Schooner with Cyril Mourning with [employer] who states that the hoyer lift will be delivered to the pt's house today. Rhonda with [employer] also notified. Pt will [facility] with [facility] for PT/OT/RN/AID    Expected Discharge Plan: [place] Barriers to Discharge: Continued Medical Work up  Expected Discharge Plan and Services     Post Acute Care Choice: [place] arrangements for the past 2 months: [place] Expected Discharge Date: 02/23/22               DME Arranged: 3-N-1, Hospital bed           [place]: [place]         Social Determinants of Health (SDOH) Interventions SDOH Screenings   Food Insecurity: No Food Insecurity (02/20/2022)  Housing: Low Risk  (02/20/2022)  Transportation Needs: No Transportation Needs (02/20/2022)  Utilities: Not At Risk (02/20/2022)  Depression (PHQ2-9): Low Risk  (11/30/2020)  Financial Resource Strain: Low Risk  (11/30/2020)  Physical Activity: Unknown (11/30/2020)  Social Connections: Unknown (11/30/2020)  Stress: No Stress Concern Present (11/30/2020)  Tobacco Use: Low Risk  (02/20/2022)    Readmission Risk Interventions    02/21/2022    9:54 AM  Readmission Risk Prevention Plan  Transportation Screening Complete  HRI or Home Care Consult Complete  Palliative Care Screening Complete

## 2022-02-23 NOTE — Discharge Summary (Signed)
Physician Discharge Summary   Patient: [name] MRN: [id number] DOB: [birth year]  Admit date:     02/19/2022  Discharge date: 02/23/22  Discharge Physician: Sharen Hones   PCP: [name], MD   Recommendations at discharge:   Follow-up with PCP in 1 week Check a BMP at next office visit.  Discharge Diagnoses: Principal Problem:   Acute hypoxic respiratory failure (HCC) Active Problems:   Acute respiratory failure with hypoxia (HCC)   Pulmonary embolism (HCC)   (HFpEF) heart failure with preserved ejection fraction (HCC)   Paroxysmal atrial fibrillation (HCC)   Hyponatremia   Essential hypertension Chronic kidney disease stage IIIa. Resolved Problems:   * No resolved hospital problems. * Hypokalemia.  Hospital Course: [name] is a 89 y.o. female with medical history significant of recent PE on Xarelto (October/2023), paroxysmal atrial fibrillation, hypertension, hyperlipidemia, HFpEF with last EF of 50-55%, CKD, who presents to the ED with shortness of breath.  She was found to be positive for influenza B, she developed acute respiratory failure with oxygen saturation of 70% on room air, she was receiving 4 L oxygen. She was also started on Levaquin for possible bacterial pneumonia.  Condition had improved, she is off oxygen, she is medically stable to be discharged. Patient back to the baseline he is wheelchair-bound, she does not ambulate.  Discussed with daughter, patient will be going home, she already set up 24-hour care.  Assessment and Plan:  * Acute hypoxic respiratory failure (HCC) Influenza B. --Per EMS, patient was saturating at 70% on room air on their arrival. She was placed on 4 L of supplemental oxygen with improvement in her oxygen saturation into the 90s.  --cause is influenza B, but given high risk for decompensation, pt was started on abx for empiric coverage. Patient condition so far had improved, off oxygen.  Will continue oral Levaquin for  another day to complete the course. Patient is out of the window for Tamiflu treatment.   Hx of Pulmonary embolism ([place]) Patient has a history of PE in the setting of COVID-19 pneumonia in October 2023.   --cont Eliquis   Chronic heart failure with preserved ejection fraction (HCC) BNP elevated today at 400, however per chart review, patient ranges between 200-400 frequently.  No evidence of frank hypervolemia on examination.   Resumed home treatment.  Follow-up with PCP as outpatient.   Paroxysmal atrial fibrillation (HCC) - Continue home Eliquis and metoprolol   Hyponatremia, resolved Chronic kidney disease stage IIIa. Hypokalemia. Reviewed prior chart, patient has chronic kidney disease stage IIIa, renal function was better after admission due to holding diuretics.  Currently creatinine stable, sodium is stable, potassium normalized.   Essential hypertension Resume home medicines.   Right arm skin tear Follow-up with home visiting nurse.      Consultants: None Procedures performed: None  Disposition: Home health Diet recommendation:  Discharge Diet Orders (From admission, onward)     Start     Ordered   02/23/22 0000  Diet - low sodium heart healthy        02/23/22 0944           Cardiac diet DISCHARGE MEDICATION: Allergies as of 02/23/2022       Reactions   Fentanyl Other (See Comments)   confusion        Medication List     STOP taking these medications    methylPREDNISolone 4 MG Tbpk tablet Commonly known as: MEDROL DOSEPAK   ondansetron 4 MG disintegrating tablet  Commonly known as: Zofran ODT       TAKE these medications    acetaminophen 500 MG tablet Commonly known as: TYLENOL Take 500 mg by mouth every 6 (six) hours as needed.   albuterol 108 (90 Base) MCG/ACT inhaler Commonly known as: VENTOLIN HFA TAKE 2 PUFFS BY MOUTH EVERY 6 HOURS AS NEEDED FOR WHEEZE OR SHORTNESS OF BREATH What changed:  how much to take how to take  this when to take this reasons to take this   amLODipine 5 MG tablet Commonly known as: NORVASC TAKE 1 TABLET EVERY DAY   apixaban 5 MG Tabs tablet Commonly known as: ELIQUIS Take 5 mg by mouth 2 (two) times daily.   calcium carbonate 1500 (600 Ca) MG Tabs tablet Commonly known as: OSCAL Take 600 mg of elemental calcium by mouth 2 (two) times daily with a meal.   diclofenac Sodium 1 % Gel Commonly known as: VOLTAREN Apply 2 g topically 3 (three) times daily.   docusate sodium 100 MG capsule Commonly known as: COLACE Take 100 mg by mouth 2 (two) times daily.   fexofenadine 60 MG tablet Commonly known as: ALLEGRA TAKE 1 TABLET EVERY DAY   furosemide 20 MG tablet Commonly known as: Lasix Take 1 tablet (20 mg total) by mouth daily.   gabapentin 100 MG capsule Commonly known as: NEURONTIN TAKE 2 CAPSULES AT BEDTIME What changed: additional instructions   irbesartan 300 MG tablet Commonly known as: AVAPRO Take 300 mg by mouth daily.   levofloxacin 250 MG tablet Commonly known as: LEVAQUIN Take 1 tablet (250 mg total) by mouth daily for 1 day. Start taking on: February 24, 2022   metoCLOPramide 5 MG tablet Commonly known as: REGLAN Take 5 mg by mouth 2 (two) times daily.   metoprolol succinate 25 MG 24 hr tablet Commonly known as: TOPROL-XL TAKE 1 TABLET TWICE DAILY What changed: when to take this   optichamber diamond Misc   pantoprazole 40 MG tablet Commonly known as: PROTONIX Take 40 mg by mouth 2 (two) times daily.   simvastatin 10 MG tablet Commonly known as: ZOCOR TAKE 1 TABLET (10 MG TOTAL) AT BEDTIME. What changed: See the new instructions.   Vitamin D3 50 MCG (2000 UT) capsule Take 2,000 Units by mouth daily.               Discharge Care Instructions  (From admission, onward)           Start     Ordered   02/23/22 0000  Discharge wound care:       Comments: Right arm wound: follow with RN Wound care to full thickness skin tear on  right forearm:  Cleanse with NS, pat dry. Apply size appropriate piece/pieces of silicone wound contact layer (Mepitel, Lawson # [id number]) to wound. Top with calcium alginate dressing Kellie Simmering # [id number]). Cover with dry gauze, ABD pad and secure with a few turns of Kerlix roll gauze/paper tape. Place no tape on skin.   02/23/22 0944            Follow-up Information     [name], MD Follow up in 1 week(s).   Specialty: Internal Medicine Contact information: [address] [place] [address] [phone]                Discharge Exam: Danley Danker Weights   02/19/22 1538  Weight: 72.3 kg   General exam: Appears calm and comfortable  Respiratory system: Clear to auscultation. Respiratory effort normal.  Cardiovascular system: S1 & S2 heard, RRR. No JVD, murmurs, rubs, gallops or clicks. No pedal edema. Gastrointestinal system: Abdomen is nondistended, soft and nontender. No organomegaly or masses felt. Normal bowel sounds heard. Central nervous system: Alert and oriented x3. No focal neurological deficits. Extremities: Symmetric 5 x 5 power. Skin: No rashes, lesions or ulcers Psychiatry: Judgement and insight appear normal. Mood & affect appropriate.    Condition at discharge: good  The results of significant diagnostics from this hospitalization (including imaging, microbiology, ancillary and laboratory) are listed below for reference.   Imaging Studies: CT Chest Wo Contrast  Result Date: 02/19/2022 CLINICAL DATA:  Shortness of breath. EXAM: CT CHEST WITHOUT CONTRAST TECHNIQUE: Multidetector CT imaging of the chest was performed following the standard protocol without IV contrast. RADIATION DOSE REDUCTION: This exam was performed according to the departmental dose-optimization program which includes automated exposure control, adjustment of the mA and/or kV according to patient size and/or use of iterative reconstruction technique. COMPARISON:  August 14, 2014 FINDINGS: Cardiovascular: There is marked severity calcification of the aortic arch and descending thoracic aorta the ascending thoracic aorta measures 3.3 cm in diameter. Mild cardiomegaly with moderate severity coronary artery calcification. A small anterior pericardial effusion is seen (4 mm in AP measurement). Mediastinum/Nodes: There is mild paratracheal lymphadenopathy. Subcentimeter calcified lymph nodes are seen along the left hilum. Thyroid gland, trachea, and esophagus demonstrate no significant findings. Lungs/Pleura: Mild to moderate severity biapical scarring and/or atelectasis is seen, right greater than left. Mild to moderate severity lingular and posterior left basilar atelectasis and/or infiltrate is also seen. Mild posterolateral right upper lobe and posterolateral right upper lobe infiltrates are noted. A 5 mm noncalcified lung nodule versus focal scar seen within the anterior aspect of the right lower lobe (axial CT image 88, CT series 3). A 5 mm calcified lung nodule is seen within the left lower lobe. There are small bilateral pleural effusions, left greater than right. No pneumothorax is identified. Upper Abdomen: There is a large hiatal hernia. Numerous punctate calcified granulomas are seen scattered throughout the spleen. There is stable diffuse left adrenal gland enlargement. A 3.8 cm x 3.1 cm partially imaged and partially calcified cyst is seen along the upper pole of the left kidney. Musculoskeletal: A chronic compression fracture deformity is seen at the level of T7 vertebral body. Multilevel degenerative changes seen throughout the thoracic spine. IMPRESSION: 1. Mild posterolateral right upper lobe and posterolateral right upper lobe infiltrates. 2. Mild to moderate severity lingular and posterior left basilar atelectasis and/or infiltrate. 3. Small bilateral pleural effusions, left greater than right. 4. 5 mm noncalcified right lower lobe lung nodule versus focal scar. No  follow-up needed if patient is low-risk.This recommendation follows the consensus statement: Guidelines for Management of Incidental Pulmonary Nodules Detected on CT Images: From the Fleischner Society 2017; Radiology 2017; 284:228-243. 5. Large hiatal hernia. 6. Small anterior pericardial effusion. 7. Chronic compression fracture deformity at the level of T7 vertebral body. 8. Aortic atherosclerosis. Aortic Atherosclerosis (ICD10-I70.0). Electronically Signed   By: Virgina Norfolk M.D.   On: 02/19/2022 20:01   DG Chest Portable 1 View  Result Date: 02/19/2022 CLINICAL DATA:  Short of breath, hypoxia EXAM: PORTABLE CHEST 1 VIEW COMPARISON:  12/13/2021 FINDINGS: Single frontal view of the chest demonstrates a stable cardiac silhouette. Large hiatal hernia. Increased density in the retrocardiac region may reflect consolidation and/or effusion. Trace right pleural effusion. Nodular shadows projecting over the right anterior second rib and third anterior intercostal space likely  reflect vessels seen on and. No pneumothorax. No acute bony abnormality. IMPRESSION: 1. Increased density at the left lung base, which may reflect consolidation and/or effusion. 2. Trace right pleural effusion. 3. Stable enlarged cardiac silhouette. 4. Hiatal hernia. Electronically Signed   By: Randa Ngo M.D.   On: 02/19/2022 16:00    Microbiology: Results for orders placed or performed during the hospital encounter of 02/19/22  Resp panel by RT-PCR (RSV, Flu A&B, Covid) Anterior Nasal Swab     Status: Abnormal   Collection Time: 02/19/22  3:46 PM   Specimen: Anterior Nasal Swab  Result Value Ref Range Status   SARS Coronavirus 2 by RT PCR NEGATIVE NEGATIVE Final    Comment: (NOTE) SARS-CoV-2 target nucleic acids are NOT DETECTED.  The SARS-CoV-2 RNA is generally detectable in upper respiratory specimens during the acute phase of infection. The lowest concentration of SARS-CoV-2 viral copies this assay can detect is 138  copies/mL. A negative result does not preclude SARS-Cov-2 infection and should not be used as the sole basis for treatment or other patient management decisions. A negative result may occur with  improper specimen collection/handling, submission of specimen other than nasopharyngeal swab, presence of viral mutation(s) within the areas targeted by this assay, and inadequate number of viral copies(<138 copies/mL). A negative result must be combined with clinical observations, patient history, and epidemiological information. The expected result is Negative.  Fact Sheet for Patients:  EntrepreneurPulse.com.au  Fact Sheet for Healthcare Providers:  IncredibleEmployment.be  This test is no t yet approved or cleared by the [place] FDA and  has been authorized for detection and/or diagnosis of SARS-CoV-2 by FDA under an Emergency Use Authorization (EUA). This EUA will remain  in effect (meaning this test can be used) for the duration of the COVID-19 declaration under Section 564(b)(1) of the Act, 21 U.S.C.section 360bbb-3(b)(1), unless the authorization is terminated  or revoked sooner.       Influenza A by PCR NEGATIVE NEGATIVE Final   Influenza B by PCR POSITIVE (A) NEGATIVE Final    Comment: (NOTE) The Xpert Xpress SARS-CoV-2/FLU/RSV plus assay is intended as an aid in the diagnosis of influenza from Nasopharyngeal swab specimens and should not be used as a sole basis for treatment. Nasal washings and aspirates are unacceptable for Xpert Xpress SARS-CoV-2/FLU/RSV testing.  Fact Sheet for Patients: EntrepreneurPulse.com.au  Fact Sheet for Healthcare Providers: IncredibleEmployment.be  This test is not yet approved or cleared by the [place] FDA and has been authorized for detection and/or diagnosis of SARS-CoV-2 by FDA under an Emergency Use Authorization (EUA). This EUA will remain in effect  (meaning this test can be used) for the duration of the COVID-19 declaration under Section 564(b)(1) of the Act, 21 U.S.C. section 360bbb-3(b)(1), unless the authorization is terminated or revoked.     Resp Syncytial Virus by PCR NEGATIVE NEGATIVE Final    Comment: (NOTE) Fact Sheet for Patients: EntrepreneurPulse.com.au  Fact Sheet for Healthcare Providers: IncredibleEmployment.be  This test is not yet approved or cleared by the [place] FDA and has been authorized for detection and/or diagnosis of SARS-CoV-2 by FDA under an Emergency Use Authorization (EUA). This EUA will remain in effect (meaning this test can be used) for the duration of the COVID-19 declaration under Section 564(b)(1) of the Act, 21 U.S.C. section 360bbb-3(b)(1), unless the authorization is terminated or revoked.  Performed at [facility], [address] [place], [place] [address]     Labs: CBC: Recent Labs  Lab 02/19/22  1546 02/20/22 0458 02/21/22 0546 02/22/22 0523 02/23/22 0421  WBC 8.2 6.0 12.2* 8.4 8.3  NEUTROABS 5.9 5.3  --   --   --   HGB 10.7* 9.5* 8.9* 7.7* 7.9*  HCT 35.0* 30.2* 27.8* 24.4* 24.8*  MCV 94.6 93.8 93.6 92.4 91.2  PLT 344 292 321 320 117   Basic Metabolic Panel: Recent Labs  Lab 02/19/22 1546 02/20/22 0458 02/21/22 0546 02/22/22 0523 02/23/22 0421  NA 133* 136 136 134* 133*  K 3.1* 3.5 3.4* 3.7 3.9  CL 99 101 102 101 100  CO2 '25 25 24 26 24  '$ GLUCOSE 171* 189* 105* 99 111*  BUN '20 19 19 19 22  '$ CREATININE 0.95 0.84 0.73 0.86 1.13*  CALCIUM 8.4* 8.2* 8.0* 7.9* 8.1*  MG  --  2.6* 2.4 2.1 2.2   Liver Function Tests: Recent Labs  Lab 02/19/22 1546  AST 37  ALT 33  ALKPHOS 66  BILITOT 1.2  PROT 6.6  ALBUMIN 3.3*   CBG: Recent Labs  Lab 02/20/22 2044  GLUCAP 278*    Discharge time spent: greater than 30 minutes.  Signed: Sharen Hones, MD [facility] 02/23/2022

## 2022-02-23 NOTE — TOC Transition Note (Addendum)
Transition of Care [facility]) - CM/SW Discharge Note   Patient Details  Name: [name] MRN: [id number] Date of Birth: 1928-12-26  Transition of Care [facility]) CM/SW Contact:  Gerilyn Pilgrim, LCSW Phone Number: 02/23/2022, 10:15 AM   Clinical Narrative:   Harrel Lemon lift to be delivered to pt house today via [employer] per Erasmo Downer.  Pt needs ambulance transport home. SW to call [facility]. Pt discharging with [facility] PT/OT/RN/AID. Spoke with Georgina Snell who states SOC will be within 24-28 hours. CSW signing off.     Final next level of care: Home w Home Health Services Barriers to Discharge: Barriers Resolved   Patient Goals and CMS Choice CMS Medicare.gov Compare Post Acute Care list provided to:: Patient Represenative (must comment) (daughter amy) Choice offered to / list presented to : Adult Children  Discharge Placement                  Patient to be transferred to facility by: [facility] Name of family member notified: Delfino Lovett Patient and family notified of of transfer: 02/23/22  Discharge Plan and Services Additional resources added to the After Visit Summary for       Post Acute Care Choice: Home Health          DME Arranged:  Harrel Lemon Lift) DME Agency: [employer] Date DME Agency Contacted: 02/23/22 Time DME Agency Contacted: [phone] Representative spoke with at DME Agency: Erasmo Downer and Suanne Marker HH Arranged: OT, PT, Nurse's Aide, RN [facility] Agency: [place] Date [place]: 02/21/22 Time [place]: 0500 Representative spoke with at [place]: [place] Determinants of Health ([place]) Interventions SDOH Screenings   Food Insecurity: No Food Insecurity (02/20/2022)  Housing: Low Risk  (02/20/2022)  Transportation Needs: No Transportation Needs (02/20/2022)  Utilities: Not At Risk (02/20/2022)  Depression (PHQ2-9): Low Risk  (11/30/2020)  Financial Resource Strain: Low Risk  (11/30/2020)  Physical Activity: Unknown (11/30/2020)  Social Connections: Unknown  (11/30/2020)  Stress: No Stress Concern Present (11/30/2020)  Tobacco Use: Low Risk  (02/20/2022)     Readmission Risk Interventions    02/21/2022    9:54 AM  Readmission Risk Prevention Plan  Transportation Screening Complete  HRI or Home Care Consult Complete  Palliative Care Screening Complete

## 2022-02-24 NOTE — Telephone Encounter (Signed)
Pt sched for 2/5 - will wait for cancellation - Pt husband advised

## 2022-02-24 NOTE — Telephone Encounter (Signed)
S/w pt husband - scheduled

## 2022-02-24 NOTE — Telephone Encounter (Signed)
Ok to schedule her for 03/09/22 - temporarily (at 1:30).  Hold for work in appt.  Also, needs TCM call.

## 2022-02-25 ENCOUNTER — Telehealth: Payer: Self-pay

## 2022-02-25 NOTE — Patient Outreach (Signed)
  Care Coordination TOC Note Transition Care Management Follow-up Telephone Call Date of discharge and from where: [facility] 02/19/22-02/23/22 How have you been since you were released from the hospital? Per patients spouse, she is doing fair, coughing keeping her up at night. Any questions or concerns? No  Items Reviewed: Did the pt receive and understand the discharge instructions provided? Yes  Medications obtained and verified? Yes  Other? No  Any new allergies since your discharge? No  Dietary orders reviewed? Yes Do you have support at home? Yes   Home Care and Equipment/Supplies: Were home health services ordered? yes If so, what is the name of the agency? [facility]  Has the agency set up a time to come to the patient's home? yes Were any new equipment or medical supplies ordered?  Yes: Hoyer lift What is the name of the medical supply agency? [employer] Were you able to get the supplies/equipment? yes Do you have any questions related to the use of the equipment or supplies? No  Functional Questionnaire: (I = Independent and D = Dependent) ADLs: D  Bathing/Dressing- D  Meal Prep- D  Eating- I  Maintaining continence- D  Transferring/Ambulation- D  Managing Meds- D  Follow up appointments reviewed:  PCP Hospital f/u appt confirmed? Yes  Scheduled to see Dr. Nicki Reaper on 03/09/22 @ 1:30. [place] [facility] f/u appt confirmed? No   Are transportation arrangements needed? No  If their condition worsens, is the pt aware to call PCP or go to the Emergency Dept.? Yes Was the patient provided with contact information for the PCP's office or ED? Yes Was to pt encouraged to call back with questions or concerns? Yes  SDOH assessments and interventions completed:   Yes SDOH Interventions Today    Flowsheet Row Most Recent Value  SDOH Interventions   Food Insecurity Interventions Intervention Not Indicated  Housing Interventions Intervention Not Indicated       Care Coordination  Interventions:  No Care Coordination interventions needed at this time.   Encounter Outcome:  Pt. Visit Completed

## 2022-02-28 ENCOUNTER — Emergency Department: Payer: Medicare HMO

## 2022-02-28 ENCOUNTER — Inpatient Hospital Stay
Admission: EM | Admit: 2022-02-28 | Discharge: 2022-03-12 | DRG: 177 | Disposition: A | Discharge status: Skilled Nursing Facility | Payer: Medicare HMO | Attending: Internal Medicine | Admitting: Internal Medicine

## 2022-02-28 ENCOUNTER — Other Ambulatory Visit: Payer: Self-pay

## 2022-02-28 DIAGNOSIS — I130 Hypertensive heart and chronic kidney disease with heart failure and stage 1 through stage 4 chronic kidney disease, or unspecified chronic kidney disease: Secondary | ICD-10-CM | POA: Diagnosis not present

## 2022-02-28 DIAGNOSIS — E860 Dehydration: Secondary | ICD-10-CM | POA: Diagnosis present

## 2022-02-28 DIAGNOSIS — Z8249 Family history of ischemic heart disease and other diseases of the circulatory system: Secondary | ICD-10-CM

## 2022-02-28 DIAGNOSIS — Z7901 Long term (current) use of anticoagulants: Secondary | ICD-10-CM

## 2022-02-28 DIAGNOSIS — D500 Iron deficiency anemia secondary to blood loss (chronic): Secondary | ICD-10-CM | POA: Diagnosis not present

## 2022-02-28 DIAGNOSIS — E871 Hypo-osmolality and hyponatremia: Secondary | ICD-10-CM | POA: Diagnosis present

## 2022-02-28 DIAGNOSIS — D649 Anemia, unspecified: Secondary | ICD-10-CM | POA: Diagnosis present

## 2022-02-28 DIAGNOSIS — Z9221 Personal history of antineoplastic chemotherapy: Secondary | ICD-10-CM

## 2022-02-28 DIAGNOSIS — R4182 Altered mental status, unspecified: Secondary | ICD-10-CM

## 2022-02-28 DIAGNOSIS — I959 Hypotension, unspecified: Secondary | ICD-10-CM | POA: Diagnosis present

## 2022-02-28 DIAGNOSIS — Z85038 Personal history of other malignant neoplasm of large intestine: Secondary | ICD-10-CM | POA: Diagnosis not present

## 2022-02-28 DIAGNOSIS — J9601 Acute respiratory failure with hypoxia: Secondary | ICD-10-CM | POA: Diagnosis present

## 2022-02-28 DIAGNOSIS — Z8041 Family history of malignant neoplasm of ovary: Secondary | ICD-10-CM

## 2022-02-28 DIAGNOSIS — E876 Hypokalemia: Secondary | ICD-10-CM | POA: Diagnosis present

## 2022-02-28 DIAGNOSIS — X58XXXA Exposure to other specified factors, initial encounter: Secondary | ICD-10-CM | POA: Diagnosis present

## 2022-02-28 DIAGNOSIS — N1831 Chronic kidney disease, stage 3a: Secondary | ICD-10-CM | POA: Diagnosis present

## 2022-02-28 DIAGNOSIS — Z7189 Other specified counseling: Secondary | ICD-10-CM | POA: Diagnosis not present

## 2022-02-28 DIAGNOSIS — I10 Essential (primary) hypertension: Secondary | ICD-10-CM | POA: Diagnosis present

## 2022-02-28 DIAGNOSIS — Z85828 Personal history of other malignant neoplasm of skin: Secondary | ICD-10-CM | POA: Diagnosis not present

## 2022-02-28 DIAGNOSIS — G9341 Metabolic encephalopathy: Secondary | ICD-10-CM | POA: Diagnosis present

## 2022-02-28 DIAGNOSIS — Z8042 Family history of malignant neoplasm of prostate: Secondary | ICD-10-CM

## 2022-02-28 DIAGNOSIS — I739 Peripheral vascular disease, unspecified: Secondary | ICD-10-CM | POA: Diagnosis present

## 2022-02-28 DIAGNOSIS — Z825 Family history of asthma and other chronic lower respiratory diseases: Secondary | ICD-10-CM

## 2022-02-28 DIAGNOSIS — N179 Acute kidney failure, unspecified: Principal | ICD-10-CM | POA: Diagnosis present

## 2022-02-28 DIAGNOSIS — I480 Paroxysmal atrial fibrillation: Secondary | ICD-10-CM | POA: Diagnosis present

## 2022-02-28 DIAGNOSIS — J690 Pneumonitis due to inhalation of food and vomit: Secondary | ICD-10-CM | POA: Diagnosis not present

## 2022-02-28 DIAGNOSIS — D62 Acute posthemorrhagic anemia: Secondary | ICD-10-CM | POA: Diagnosis present

## 2022-02-28 DIAGNOSIS — E7800 Pure hypercholesterolemia, unspecified: Secondary | ICD-10-CM | POA: Diagnosis present

## 2022-02-28 DIAGNOSIS — Z9049 Acquired absence of other specified parts of digestive tract: Secondary | ICD-10-CM

## 2022-02-28 DIAGNOSIS — N3000 Acute cystitis without hematuria: Secondary | ICD-10-CM | POA: Diagnosis not present

## 2022-02-28 DIAGNOSIS — N390 Urinary tract infection, site not specified: Secondary | ICD-10-CM | POA: Diagnosis present

## 2022-02-28 DIAGNOSIS — Z1152 Encounter for screening for COVID-19: Secondary | ICD-10-CM | POA: Diagnosis not present

## 2022-02-28 DIAGNOSIS — Z86711 Personal history of pulmonary embolism: Secondary | ICD-10-CM

## 2022-02-28 DIAGNOSIS — R531 Weakness: Secondary | ICD-10-CM | POA: Diagnosis not present

## 2022-02-28 DIAGNOSIS — I5032 Chronic diastolic (congestive) heart failure: Secondary | ICD-10-CM | POA: Diagnosis present

## 2022-02-28 DIAGNOSIS — Z66 Do not resuscitate: Secondary | ICD-10-CM | POA: Diagnosis not present

## 2022-02-28 DIAGNOSIS — J189 Pneumonia, unspecified organism: Secondary | ICD-10-CM | POA: Diagnosis not present

## 2022-02-28 DIAGNOSIS — I2782 Chronic pulmonary embolism: Secondary | ICD-10-CM | POA: Diagnosis not present

## 2022-02-28 DIAGNOSIS — Z83438 Family history of other disorder of lipoprotein metabolism and other lipidemia: Secondary | ICD-10-CM

## 2022-02-28 DIAGNOSIS — G2581 Restless legs syndrome: Secondary | ICD-10-CM | POA: Diagnosis present

## 2022-02-28 DIAGNOSIS — J9811 Atelectasis: Secondary | ICD-10-CM | POA: Diagnosis not present

## 2022-02-28 DIAGNOSIS — Z833 Family history of diabetes mellitus: Secondary | ICD-10-CM

## 2022-02-28 DIAGNOSIS — R0602 Shortness of breath: Secondary | ICD-10-CM | POA: Diagnosis not present

## 2022-02-28 DIAGNOSIS — I2699 Other pulmonary embolism without acute cor pulmonale: Secondary | ICD-10-CM | POA: Diagnosis present

## 2022-02-28 DIAGNOSIS — Z803 Family history of malignant neoplasm of breast: Secondary | ICD-10-CM

## 2022-02-28 DIAGNOSIS — Z79899 Other long term (current) drug therapy: Secondary | ICD-10-CM

## 2022-02-28 DIAGNOSIS — Z7401 Bed confinement status: Secondary | ICD-10-CM | POA: Diagnosis not present

## 2022-02-28 DIAGNOSIS — Z823 Family history of stroke: Secondary | ICD-10-CM

## 2022-02-28 DIAGNOSIS — S41112A Laceration without foreign body of left upper arm, initial encounter: Secondary | ICD-10-CM | POA: Diagnosis present

## 2022-02-28 DIAGNOSIS — Z90710 Acquired absence of both cervix and uterus: Secondary | ICD-10-CM

## 2022-02-28 DIAGNOSIS — K219 Gastro-esophageal reflux disease without esophagitis: Secondary | ICD-10-CM | POA: Diagnosis present

## 2022-02-28 DIAGNOSIS — R58 Hemorrhage, not elsewhere classified: Secondary | ICD-10-CM | POA: Diagnosis not present

## 2022-02-28 LAB — URINALYSIS, ROUTINE W REFLEX MICROSCOPIC
Bacteria, UA: NONE SEEN
Bilirubin Urine: NEGATIVE
Glucose, UA: NEGATIVE mg/dL
Ketones, ur: NEGATIVE mg/dL
Leukocytes,Ua: NEGATIVE
Nitrite: NEGATIVE
Protein, ur: 30 mg/dL — AB
Specific Gravity, Urine: 1.016 (ref 1.005–1.030)
pH: 5 (ref 5.0–8.0)

## 2022-02-28 LAB — CBC WITH DIFFERENTIAL/PLATELET
Abs Immature Granulocytes: 0.13 10*3/uL — ABNORMAL HIGH (ref 0.00–0.07)
Basophils Absolute: 0 10*3/uL (ref 0.0–0.1)
Basophils Relative: 0 %
Eosinophils Absolute: 0.2 10*3/uL (ref 0.0–0.5)
Eosinophils Relative: 1 %
HCT: 24.9 % — ABNORMAL LOW (ref 36.0–46.0)
Hemoglobin: 7.8 g/dL — ABNORMAL LOW (ref 12.0–15.0)
Immature Granulocytes: 1 %
Lymphocytes Relative: 16 %
Lymphs Abs: 1.7 10*3/uL (ref 0.7–4.0)
MCH: 28.8 pg (ref 26.0–34.0)
MCHC: 31.3 g/dL (ref 30.0–36.0)
MCV: 91.9 fL (ref 80.0–100.0)
Monocytes Absolute: 0.9 10*3/uL (ref 0.1–1.0)
Monocytes Relative: 9 %
Neutro Abs: 7.8 10*3/uL — ABNORMAL HIGH (ref 1.7–7.7)
Neutrophils Relative %: 73 %
Platelets: 507 10*3/uL — ABNORMAL HIGH (ref 150–400)
RBC: 2.71 MIL/uL — ABNORMAL LOW (ref 3.87–5.11)
RDW: 15.1 % (ref 11.5–15.5)
WBC: 10.8 10*3/uL — ABNORMAL HIGH (ref 4.0–10.5)
nRBC: 0 % (ref 0.0–0.2)

## 2022-02-28 LAB — BASIC METABOLIC PANEL
Anion gap: 12 (ref 5–15)
BUN: 28 mg/dL — ABNORMAL HIGH (ref 8–23)
CO2: 22 mmol/L (ref 22–32)
Calcium: 8.2 mg/dL — ABNORMAL LOW (ref 8.9–10.3)
Chloride: 97 mmol/L — ABNORMAL LOW (ref 98–111)
Creatinine, Ser: 2.41 mg/dL — ABNORMAL HIGH (ref 0.44–1.00)
GFR, Estimated: 19 mL/min — ABNORMAL LOW (ref >60)
Glucose, Bld: 98 mg/dL (ref 70–99)
Potassium: 3.5 mmol/L (ref 3.5–5.1)
Sodium: 131 mmol/L — ABNORMAL LOW (ref 135–145)

## 2022-02-28 LAB — RESP PANEL BY RT-PCR (RSV, FLU A&B, COVID)  RVPGX2
Influenza A by PCR: NEGATIVE
Influenza B by PCR: NEGATIVE
Resp Syncytial Virus by PCR: NEGATIVE
SARS Coronavirus 2 by RT PCR: NEGATIVE

## 2022-02-28 LAB — TROPONIN I (HIGH SENSITIVITY)
Troponin I (High Sensitivity): 15 ng/L (ref <18)
Troponin I (High Sensitivity): 16 ng/L (ref <18)

## 2022-02-28 LAB — CBG MONITORING, ED: Glucose-Capillary: 92 mg/dL (ref 70–99)

## 2022-02-28 LAB — LACTIC ACID, PLASMA: Lactic Acid, Venous: 0.9 mmol/L (ref 0.5–1.9)

## 2022-02-28 LAB — PROCALCITONIN: Procalcitonin: <0.1 ng/mL

## 2022-02-28 LAB — BRAIN NATRIURETIC PEPTIDE: B Natriuretic Peptide: 340.4 pg/mL — ABNORMAL HIGH (ref 0.0–100.0)

## 2022-02-28 MED ORDER — DM-GUAIFENESIN ER 30-600 MG PO TB12: 1.0000 | ORAL_TABLET | Freq: Two times a day (BID) | ORAL | Status: DC | PRN

## 2022-02-28 MED ORDER — AMLODIPINE BESYLATE 5 MG PO TABS: 5.0000 mg | ORAL_TABLET | Freq: Every day | ORAL | Status: DC

## 2022-02-28 MED ORDER — DOCUSATE SODIUM 100 MG PO CAPS
100.0000 mg | ORAL_CAPSULE | Freq: Two times a day (BID) | ORAL | Status: DC
  Administered 2022-03-01 – 2022-03-11 (×20): 100 mg via ORAL
  Filled 2022-02-28 (×20): qty 1

## 2022-02-28 MED ORDER — SODIUM CHLORIDE 0.9 % IV SOLN
3.0000 g | Freq: Two times a day (BID) | INTRAVENOUS | Status: DC
  Administered 2022-02-28 – 2022-03-03 (×6): 3 g via INTRAVENOUS
  Filled 2022-02-28 (×3): qty 8
  Filled 2022-02-28: qty 3
  Filled 2022-02-28 (×3): qty 8

## 2022-02-28 MED ORDER — SIMVASTATIN 20 MG PO TABS
10.0000 mg | ORAL_TABLET | Freq: Every day | ORAL | Status: DC
  Administered 2022-03-01 – 2022-03-11 (×11): 10 mg via ORAL
  Filled 2022-02-28 (×11): qty 1

## 2022-02-28 MED ORDER — ALBUTEROL SULFATE (2.5 MG/3ML) 0.083% IN NEBU: 2.5000 mg | INHALATION_SOLUTION | RESPIRATORY_TRACT | Status: DC | PRN

## 2022-02-28 MED ORDER — VITAMIN D 25 MCG (1000 UNIT) PO TABS
2000.0000 [IU] | ORAL_TABLET | Freq: Every day | ORAL | Status: DC
  Administered 2022-03-02 – 2022-03-12 (×11): 2000 [IU] via ORAL
  Filled 2022-02-28 (×11): qty 2

## 2022-02-28 MED ORDER — SODIUM CHLORIDE 0.9 % IV SOLN: INTRAVENOUS | Status: DC

## 2022-02-28 MED ORDER — GABAPENTIN 100 MG PO CAPS
200.0000 mg | ORAL_CAPSULE | Freq: Every day | ORAL | Status: DC
  Administered 2022-03-01 – 2022-03-11 (×11): 200 mg via ORAL
  Filled 2022-02-28 (×11): qty 2

## 2022-02-28 MED ORDER — ACETAMINOPHEN 325 MG PO TABS
650.0000 mg | ORAL_TABLET | Freq: Four times a day (QID) | ORAL | Status: DC | PRN
  Filled 2022-02-28: qty 2

## 2022-02-28 MED ORDER — APIXABAN 5 MG PO TABS
5.0000 mg | ORAL_TABLET | Freq: Two times a day (BID) | ORAL | Status: DC
  Administered 2022-02-28 – 2022-03-12 (×24): 5 mg via ORAL
  Filled 2022-02-28 (×24): qty 1

## 2022-02-28 MED ORDER — PANTOPRAZOLE SODIUM 40 MG PO TBEC
40.0000 mg | DELAYED_RELEASE_TABLET | Freq: Two times a day (BID) | ORAL | Status: DC
  Administered 2022-03-01 – 2022-03-12 (×23): 40 mg via ORAL
  Filled 2022-02-28 (×23): qty 1

## 2022-02-28 MED ORDER — IPRATROPIUM-ALBUTEROL 0.5-2.5 (3) MG/3ML IN SOLN
3.0000 mL | RESPIRATORY_TRACT | Status: DC
  Administered 2022-02-28 – 2022-03-02 (×12): 3 mL via RESPIRATORY_TRACT
  Filled 2022-02-28 (×12): qty 3

## 2022-02-28 MED ORDER — SODIUM CHLORIDE 0.9 % IV BOLUS
500.0000 mL | Freq: Once | INTRAVENOUS | Status: AC
  Administered 2022-02-28: 500 mL via INTRAVENOUS

## 2022-02-28 MED ORDER — ACETAMINOPHEN 650 MG RE SUPP: 650.0000 mg | Freq: Four times a day (QID) | RECTAL | Status: DC | PRN

## 2022-02-28 MED ORDER — METOPROLOL SUCCINATE ER 25 MG PO TB24
25.0000 mg | ORAL_TABLET | Freq: Every day | ORAL | Status: DC
  Administered 2022-03-01 – 2022-03-12 (×11): 25 mg via ORAL
  Filled 2022-02-28 (×12): qty 1

## 2022-02-28 MED ORDER — CALCIUM CARBONATE 1250 (500 CA) MG PO TABS
500.0000 mg | ORAL_TABLET | Freq: Two times a day (BID) | ORAL | Status: DC
  Administered 2022-03-01 – 2022-03-12 (×23): 1250 mg via ORAL
  Filled 2022-02-28 (×24): qty 1

## 2022-02-28 MED ORDER — ONDANSETRON HCL 4 MG/2ML IJ SOLN: 4.0000 mg | Freq: Three times a day (TID) | INTRAMUSCULAR | Status: DC | PRN

## 2022-02-28 MED ORDER — HYDRALAZINE HCL 20 MG/ML IJ SOLN: 5.0000 mg | INTRAMUSCULAR | Status: DC | PRN

## 2022-02-28 MED ORDER — DICLOFENAC SODIUM 1 % EX GEL
2.0000 g | Freq: Three times a day (TID) | CUTANEOUS | Status: DC
  Administered 2022-03-01: 2 g via TOPICAL
  Filled 2022-02-28 (×2): qty 100

## 2022-02-28 NOTE — ED Provider Notes (Signed)
[facility] Provider Note    Event Date/Time   First MD Initiated Contact with Patient 02/28/22 1134     (approximate)   History   No chief complaint on file.   HPI  [name] is a 89 y.o. female with a history of PE on Xarelto, paroxysmal atrial fibrillation, hypertension, hyperlipidemia, HFpEF, and CKD who presents with altered mental status.  The family states that the patient was discharged from the hospital 5 days ago.  Yesterday she was doing well, but since this morning she has been confused and weak appearing.  EMS reported that the patient's O2 saturation was in the 70s on room air.  The patient herself is now arousable but denies any acute pain, difficulty breathing, or other acute complaints.  I reviewed the past medical records; the patient was admitted earlier this month; per the hospitalist discharge summary from 1/10 she was treated for acute hypoxic respiratory failure due to influenza.  She was also given antibiotics for empiric coverage for possible pneumonia.  She was off of oxygen at the time of discharge.   Physical Exam   Triage Vital Signs: ED Triage Vitals  Enc Vitals Group     BP 02/28/22 1145 (!) 114/59     Pulse Rate 02/28/22 1145 75     Resp 02/28/22 1145 20     Temp --      Temp src --      SpO2 02/28/22 1145 94 %     Weight 02/28/22 1137 159 lb 6.3 oz (72.3 kg)     Height 02/28/22 1137 '5\' 2"'$  (1.575 m)     Head Circumference --      Peak Flow --      Pain Score --      Pain Loc --      Pain Edu? --      Excl. in [place]? --     Most recent vital signs: Vitals:   02/28/22 1600 02/28/22 1700  BP: (!) 106/43 (!) 99/53  Pulse: 68 67  Resp: 10 11  Temp:    SpO2: 99% 98%    General: Sleepy and weak appearing but arousable, no acute distress. CV:  Good peripheral perfusion.  Normal heart sounds. Resp:  Normal effort.  Rhonchorous breath sounds bilaterally. Abd:  No distention.  Soft and tender. Other:  EOMI.   PERRLA.  Motor intact in all extremities.  Normal speech.   ED Results / Procedures / Treatments   Labs (all labs ordered are listed, but only abnormal results are displayed) Labs Reviewed  BASIC METABOLIC PANEL - Abnormal; Notable for the following components:      Result Value   Sodium 131 (*)    Chloride 97 (*)    BUN 28 (*)    Creatinine, Ser 2.41 (*)    Calcium 8.2 (*)    GFR, Estimated 19 (*)    All other components within normal limits  CBC WITH DIFFERENTIAL/PLATELET - Abnormal; Notable for the following components:   WBC 10.8 (*)    RBC 2.71 (*)    Hemoglobin 7.8 (*)    HCT 24.9 (*)    Platelets 507 (*)    Neutro Abs 7.8 (*)    Abs Immature Granulocytes 0.13 (*)    All other components within normal limits  BRAIN NATRIURETIC PEPTIDE - Abnormal; Notable for the following components:   B Natriuretic Peptide 340.4 (*)    All other components within normal limits  URINALYSIS,  ROUTINE W REFLEX MICROSCOPIC - Abnormal; Notable for the following components:   Color, Urine AMBER (*)    APPearance CLOUDY (*)    Hgb urine dipstick MODERATE (*)    Protein, ur 30 (*)    All other components within normal limits  RESP PANEL BY RT-PCR (RSV, FLU A&B, COVID)  RVPGX2  URINE CULTURE  EXPECTORATED SPUTUM ASSESSMENT W GRAM STAIN, RFLX TO RESP C  CULTURE, BLOOD (ROUTINE X 2)  CULTURE, BLOOD (ROUTINE X 2)  PROCALCITONIN  LACTIC ACID, PLASMA  CBC  BASIC METABOLIC PANEL  CBG MONITORING, ED  TROPONIN I (HIGH SENSITIVITY)  TROPONIN I (HIGH SENSITIVITY)       RADIOLOGY  Chest x-ray: I independently viewed and interpreted the images; there is a left lower lobe consolidation with no significant change compared to chest x-ray from earlier this month  CT head: No ICH or other acute abnormality  PROCEDURES:  Critical Care performed: No  Procedures   MEDICATIONS ORDERED IN ED: Medications  ipratropium-albuterol (DUONEB) 0.5-2.5 (3) MG/3ML nebulizer solution 3 mL (3 mLs  Nebulization Given 02/28/22 1647)  albuterol (PROVENTIL) (2.5 MG/3ML) 0.083% nebulizer solution 2.5 mg (has no administration in time range)  dextromethorphan-guaiFENesin (MUCINEX DM) 30-600 MG per 12 hr tablet 1 tablet (has no administration in time range)  ondansetron (ZOFRAN) injection 4 mg (has no administration in time range)  hydrALAZINE (APRESOLINE) injection 5 mg (has no administration in time range)  acetaminophen (TYLENOL) tablet 650 mg (has no administration in time range)  acetaminophen (TYLENOL) suppository 650 mg (has no administration in time range)  Ampicillin-Sulbactam (UNASYN) 3 g in sodium chloride 0.9 % 100 mL IVPB (3 g Intravenous New Bag/Given 02/28/22 1647)     IMPRESSION / MDM / ASSESSMENT AND PLAN / ED COURSE  I reviewed the triage vital signs and the nursing notes.  89 year old female with PMH as noted above, recently admitted for respiratory failure due to influenza presents with acute onset of altered mental status today although it appears to be somewhat improving.  She was also found to be hypoxic by EMS but currently her O2 saturation is normal on room air.  Physical exam is otherwise unremarkable for acute findings.  Differential diagnosis includes, but is not limited to, recurrent or worsening pneumonia, pulmonary edema, UTI or other infection, electrolyte abnormality, AKI, or other metabolic cause, or less likely cardiac or CNS etiology.  Will obtain CT head, chest x-ray, lab workup, and reassess.  Patient's presentation is most consistent with acute presentation with potential threat to life or bodily function.  The patient is on the cardiac monitor to evaluate for evidence of arrhythmia and/or significant heart rate changes.  ----------------------------------------- 4:05 PM on 02/28/2022 -----------------------------------------  Lab workup is significant for AKI with a creatinine of 2.4.  Chest x-ray does not show any evidence of acute or worsening  pneumonia.  WBC count is mildly elevated.  Urinalysis shows some RBCs and WBCs but no other findings to suggest UTI.  Lactate is normal.  Respiratory panel is negative.  Given the AKI and altered mental status, I consulted Dr. Blaine Hamper from the hospitalist service; based on our discussion he agreed to admit the patient.   FINAL CLINICAL IMPRESSION(S) / ED DIAGNOSES   Final diagnoses:  AKI (acute kidney injury) ([place])  Altered mental status, unspecified altered mental status type     Rx / DC Orders   ED Discharge Orders     None        Note:  This document  was prepared using Systems analyst and may include unintentional dictation errors.    Arta Silence, MD 02/28/22 [phone]

## 2022-02-28 NOTE — ED Notes (Signed)
Messaged Mansy, MD about patient's blood pressure.

## 2022-02-28 NOTE — H&P (Signed)
History and Physical    [name] WRU:[id number] DOB: [birth year] DOA: 02/28/2022  Referring MD/NP/PA:   PCP: [name], MD   Patient coming from:  The patient is coming from home.    Chief Complaint: AMS, cough and SOB  HPI: [name] is a 89 y.o. female with medical history significant of hypertension, hyperlipidemia, PVD, diastolic CHF, GERD, atrial fibrillation and PE on Eliquis, hereditary hemochromatosis, colon cancer (s/p of chemo and surgery), who presents with altered mental status, shortness of breath.  Patient was recently hospitalized from 1/6 due to 1/10 due to flu B infection and possible pneumonia.  Patient was discharged on Levaquin. Per her daughter at the bedside, patient was noted to be confused since this morning.  Patient has shortness breath and productive cough.  Does not seem to have chest pain.  No fever or chills.  No active nausea, vomiting, diarrhea noted.  Does not seem to have abdominal pain.  Not sure if patient has symptoms of UTI because she is using diaper.  She has  chronic leg weakness, using wheelchair. She slightly moves her legs which is normal to patient.  She moves arms normally.  No facial droop or slurred speech.  Per her daughter, at her normal baseline, is alert oriented x 3.  When I saw patient in ED, patient is confused, orientated to person and place, not to the time.  Patient is normally not using oxygen, was found to have oxygen desaturation to 70%, initially started on nonrebreather, that changed to 2 L oxygen with 99% of saturation in ED.  Data reviewed independently and ED Course: pt was found to have WBC 10.8, urinalysis (cloudy appearance, negative leukocyte, negative bacteria, WBC 11-20), negative PCR for COVID, flu and RSV.  BNP 340.  Temperature normal, blood pressure 123/48, heart rate 82, RR 20.  Chest x-ray showed patchy infiltration in left middle lobe and left lower lobe.  CT head negative.  Patient is admitted to  telemetry bed as inpatient   EKG:  Not done in ED, will get one.     Review of Systems: Could not be reviewed due to altered mental status.   Allergy:  Allergies  Allergen Reactions   Fentanyl Other (See Comments)    confusion    Past Medical History:  Diagnosis Date   Anemia    Barrett's esophagus    BCC (basal cell carcinoma of skin) 03/07/2013   vertex scalp - NODULAR NODULAR PATTERN PATTERN   Bowel obstruction (HCC)    s/p adhesion resection   CHF (congestive heart failure) (HCC)    Chronic back pain    Colon cancer ([place]) 1988 and 1989   adenocarcinoma, s/p resection x  2 and chemo   Diverticulitis    GERD (gastroesophageal reflux disease)    H/O open leg wound    Hiatal hernia    Hypercholesteremia    Hypertension    Lumbar scoliosis    OA (osteoarthritis)    Peripheral neuropathy    Recurrent sinus infections    Renal cyst    S/P chemotherapy, time since greater than 12 weeks    colon cancer   Vertigo     Past Surgical History:  Procedure Laterality Date   ABDOMINAL HYSTERECTOMY  1982   adhesions resected     bowel obstruction   APPENDECTOMY     BREAST BIOPSY Left    negative 06/14/1985   CHOLECYSTECTOMY  1994   COLON RESECTION     x2.  s/p colon cancer   COLON SURGERY  1958 and [facility]   EXCISIONAL HEMORRHOIDECTOMY     with tubal ligation   EXPLORATORY LAPAROTOMY  1991   Secondary to SBO    Social History:  reports that she has never smoked. She has never used smokeless tobacco. She reports that she does not drink alcohol and does not use drugs.  Family History:  Family History  Problem Relation Age of Onset   Breast cancer Mother    Asthma Mother    Stroke Father    Hypertension Father    Diabetes Father    Ovarian cancer Sister        x2   Prostate cancer Brother    Hypertension Brother        x3   Heart disease Brother    Hypercholesterolemia Brother        x3   Diabetes Brother    Spina bifida Grandchild     Hematuria Neg Hx    Kidney cancer Neg Hx    Kidney disease Neg Hx    Sickle cell trait Neg Hx    Tuberculosis Neg Hx      Prior to Admission medications   Medication Sig Start Date End Date Taking? Authorizing Provider  acetaminophen (TYLENOL) 500 MG tablet Take 500 mg by mouth every 6 (six) hours as needed.   Yes [provider]  albuterol (VENTOLIN HFA) 108 (90 Base) MCG/ACT inhaler TAKE 2 PUFFS BY MOUTH EVERY 6 HOURS AS NEEDED FOR WHEEZE OR SHORTNESS OF BREATH Patient taking differently: Inhale 2 puffs into the lungs every 6 (six) hours as needed for wheezing or shortness of breath. TAKE 2 PUFFS BY MOUTH EVERY 6 HOURS AS NEEDED FOR WHEEZE OR SHORTNESS OF BREATH 11/12/21  Yes [name], MD  amLODipine (NORVASC) 5 MG tablet TAKE 1 TABLET EVERY DAY 11/29/21  Yes [name], MD  apixaban (ELIQUIS) 5 MG TABS tablet Take 5 mg by mouth 2 (two) times daily. 01/31/22  Yes [provider]  benzonatate (TESSALON) 200 MG capsule Take 200 mg by mouth 3 (three) times daily. 02/14/22  Yes [provider]  calcium carbonate (OSCAL) 1500 (600 Ca) MG TABS tablet Take 600 mg of elemental calcium by mouth 2 (two) times daily with a meal.   Yes [provider]  Cholecalciferol (VITAMIN D3) 50 MCG (2000 UT) capsule Take 2,000 Units by mouth daily.   Yes [provider]  diclofenac Sodium (VOLTAREN) 1 % GEL Apply 2 g topically 3 (three) times daily. 11/20/21  Yes Fritzi Mandes, MD  docusate sodium (COLACE) 100 MG capsule Take 100 mg by mouth 2 (two) times daily.   Yes [provider]  fexofenadine (ALLEGRA) 60 MG tablet TAKE 1 TABLET EVERY DAY Patient taking differently: Take 60 mg by mouth daily. 08/29/21  Yes Dutch Quint B, FNP  furosemide (LASIX) 20 MG tablet Take 1 tablet (20 mg total) by mouth daily. 12/11/21 02/28/22 Yes Max Sane, MD  gabapentin (NEURONTIN) 100 MG capsule TAKE 2 CAPSULES AT BEDTIME Patient taking differently: Take 200 mg by mouth  at bedtime. TAKE 2 CAPSULES AT BEDTIME 05/18/20  Yes [name], MD  ipratropium (ATROVENT) 0.06 % nasal spray Place 2 sprays into both nostrils 4 (four) times daily. 02/14/22  Yes [provider]  irbesartan (AVAPRO) 300 MG tablet Take 300 mg by mouth daily. 02/03/22  Yes [provider]  metoCLOPramide (REGLAN) 5 MG tablet Take 5 mg by mouth  2 (two) times daily. 11/29/21  Yes [provider]  metoprolol succinate (TOPROL-XL) 25 MG 24 hr tablet TAKE 1 TABLET TWICE DAILY Patient taking differently: Take 25 mg by mouth daily. 01/16/22  Yes [name], MD  pantoprazole (PROTONIX) 40 MG tablet Take 40 mg by mouth 2 (two) times daily. 11/29/21  Yes [provider]  simvastatin (ZOCOR) 10 MG tablet TAKE 1 TABLET (10 MG TOTAL) AT BEDTIME. Patient taking differently: Take 10 mg by mouth daily at 6 PM. 02/23/21   [name], MD  [employer] [facility] ([place] [place]  06/26/19   [provider]    Physical Exam: Vitals:   02/28/22 1430 02/28/22 1530 02/28/22 1600 02/28/22 1700  BP: (!) 123/48 (!) 129/48 (!) 106/43 (!) 99/53  Pulse: 79 79 68 67  Resp: '17 13 10 11  '$ Temp:      TempSrc:      SpO2: 97% 99% 99% 98%  Weight:      Height:       General: Not in acute distress HEENT:       Eyes: PERRL, EOMI, no scleral icterus.       ENT: No discharge from the ears and nose       Neck: No JVD, no bruit, no mass felt. Heme: No neck lymph node enlargement. Cardiac: S1/S2, RRR, No murmurs, No gallops or rubs. Respiratory: Has rhonchi bilaterally GI: Soft, nondistended, nontender, no organomegaly, BS present. GU: No hematuria Ext: has trace leg edema bilaterally. 1+DP/PT pulse bilaterally. Musculoskeletal: No joint deformities, No joint redness or warmth, no limitation of ROM in spin. Skin: No rashes.  Neuro: Confused, orientated to the person and place, not to time, oriented X3, cranial nerves II-XII grossly intact, moves all  extremities  . Psych: Patient is not psychotic, no suicidal or hemocidal ideation.  Labs on Admission: I have personally reviewed following labs and imaging studies  CBC: Recent Labs  Lab 02/22/22 0523 02/23/22 0421 02/28/22 1142  WBC 8.4 8.3 10.8*  NEUTROABS  --   --  7.8*  HGB 7.7* 7.9* 7.8*  HCT 24.4* 24.8* 24.9*  MCV 92.4 91.2 91.9  PLT 320 369 366*   Basic Metabolic Panel: Recent Labs  Lab 02/22/22 0523 02/23/22 0421 02/28/22 1142  NA 134* 133* 131*  K 3.7 3.9 3.5  CL 101 100 97*  CO2 '26 24 22  '$ GLUCOSE 99 111* 98  BUN 19 22 28*  CREATININE 0.86 1.13* 2.41*  CALCIUM 7.9* 8.1* 8.2*  MG 2.1 2.2  --    GFR: Estimated Creatinine Clearance: 14.2 mL/min (A) (by C-G formula based on SCr of 2.41 mg/dL (H)). Liver Function Tests: No results for input(s): "AST", "ALT", "ALKPHOS", "BILITOT", "PROT", "ALBUMIN" in the last 168 hours. No results for input(s): "LIPASE", "AMYLASE" in the last 168 hours. No results for input(s): "AMMONIA" in the last 168 hours. Coagulation Profile: Recent Labs  Lab 02/22/22 1413  INR 1.5*   Cardiac Enzymes: No results for input(s): "CKTOTAL", "CKMB", "CKMBINDEX", "TROPONINI" in the last 168 hours. BNP (last 3 results) No results for input(s): "PROBNP" in the last 8760 hours. HbA1C: No results for input(s): "HGBA1C" in the last 72 hours. CBG: Recent Labs  Lab 02/28/22 1142  GLUCAP 92   Lipid Profile: No results for input(s): "CHOL", "HDL", "LDLCALC", "TRIG", "CHOLHDL", "LDLDIRECT" in the last 72 hours. Thyroid Function Tests: No results for input(s): "TSH", "T4TOTAL", "FREET4", "T3FREE", "THYROIDAB" in the last 72 hours. Anemia Panel: No results for input(s): "VITAMINB12", "FOLATE", "FERRITIN", "  TIBC", "IRON", "RETICCTPCT" in the last 72 hours. Urine analysis:    Component Value Date/Time   COLORURINE AMBER (A) 02/28/2022 1215   APPEARANCEUR CLOUDY (A) 02/28/2022 1215   LABSPEC 1.016 02/28/2022 1215   PHURINE 5.0 02/28/2022  1215   GLUCOSEU NEGATIVE 02/28/2022 1215   GLUCOSEU NEGATIVE 04/04/2018 1255   HGBUR MODERATE (A) 02/28/2022 1215   BILIRUBINUR NEGATIVE 02/28/2022 1215   KETONESUR NEGATIVE 02/28/2022 1215   PROTEINUR 30 (A) 02/28/2022 1215   UROBILINOGEN 0.2 04/04/2018 1255   NITRITE NEGATIVE 02/28/2022 1215   LEUKOCYTESUR NEGATIVE 02/28/2022 1215   Sepsis Labs: '@LABRCNTIP'$ (procalcitonin:4,lacticidven:4) ) Recent Results (from the past 240 hour(s))  Resp panel by RT-PCR (RSV, Flu A&B, Covid) Anterior Nasal Swab     Status: Abnormal   Collection Time: 02/19/22  3:46 PM   Specimen: Anterior Nasal Swab  Result Value Ref Range Status   SARS Coronavirus 2 by RT PCR NEGATIVE NEGATIVE Final    Comment: (NOTE) SARS-CoV-2 target nucleic acids are NOT DETECTED.  The SARS-CoV-2 RNA is generally detectable in upper respiratory specimens during the acute phase of infection. The lowest concentration of SARS-CoV-2 viral copies this assay can detect is 138 copies/mL. A negative result does not preclude SARS-Cov-2 infection and should not be used as the sole basis for treatment or other patient management decisions. A negative result may occur with  improper specimen collection/handling, submission of specimen other than nasopharyngeal swab, presence of viral mutation(s) within the areas targeted by this assay, and inadequate number of viral copies(<138 copies/mL). A negative result must be combined with clinical observations, patient history, and epidemiological information. The expected result is Negative.  Fact Sheet for Patients:  EntrepreneurPulse.com.au  Fact Sheet for Healthcare Providers:  IncredibleEmployment.be  This test is no t yet approved or cleared by the [place] FDA and  has been authorized for detection and/or diagnosis of SARS-CoV-2 by FDA under an Emergency Use Authorization (EUA). This EUA will remain  in effect (meaning this test can be used)  for the duration of the COVID-19 declaration under Section 564(b)(1) of the Act, 21 U.S.C.section 360bbb-3(b)(1), unless the authorization is terminated  or revoked sooner.       Influenza A by PCR NEGATIVE NEGATIVE Final   Influenza B by PCR POSITIVE (A) NEGATIVE Final    Comment: (NOTE) The Xpert Xpress SARS-CoV-2/FLU/RSV plus assay is intended as an aid in the diagnosis of influenza from Nasopharyngeal swab specimens and should not be used as a sole basis for treatment. Nasal washings and aspirates are unacceptable for Xpert Xpress SARS-CoV-2/FLU/RSV testing.  Fact Sheet for Patients: EntrepreneurPulse.com.au  Fact Sheet for Healthcare Providers: IncredibleEmployment.be  This test is not yet approved or cleared by the [place] FDA and has been authorized for detection and/or diagnosis of SARS-CoV-2 by FDA under an Emergency Use Authorization (EUA). This EUA will remain in effect (meaning this test can be used) for the duration of the COVID-19 declaration under Section 564(b)(1) of the Act, 21 U.S.C. section 360bbb-3(b)(1), unless the authorization is terminated or revoked.     Resp Syncytial Virus by PCR NEGATIVE NEGATIVE Final    Comment: (NOTE) Fact Sheet for Patients: EntrepreneurPulse.com.au  Fact Sheet for Healthcare Providers: IncredibleEmployment.be  This test is not yet approved or cleared by the [place] FDA and has been authorized for detection and/or diagnosis of SARS-CoV-2 by FDA under an Emergency Use Authorization (EUA). This EUA will remain in effect (meaning this test can be used) for the duration of the COVID-19  declaration under Section 564(b)(1) of the Act, 21 U.S.C. section 360bbb-3(b)(1), unless the authorization is terminated or revoked.  Performed at [facility], [address] [place], [place] [address]   Resp panel by RT-PCR (RSV, Flu A&B, Covid)  Anterior Nasal Swab     Status: None   Collection Time: 02/28/22 12:14 PM   Specimen: Anterior Nasal Swab  Result Value Ref Range Status   SARS Coronavirus 2 by RT PCR NEGATIVE NEGATIVE Final    Comment: (NOTE) SARS-CoV-2 target nucleic acids are NOT DETECTED.  The SARS-CoV-2 RNA is generally detectable in upper respiratory specimens during the acute phase of infection. The lowest concentration of SARS-CoV-2 viral copies this assay can detect is 138 copies/mL. A negative result does not preclude SARS-Cov-2 infection and should not be used as the sole basis for treatment or other patient management decisions. A negative result may occur with  improper specimen collection/handling, submission of specimen other than nasopharyngeal swab, presence of viral mutation(s) within the areas targeted by this assay, and inadequate number of viral copies(<138 copies/mL). A negative result must be combined with clinical observations, patient history, and epidemiological information. The expected result is Negative.  Fact Sheet for Patients:  EntrepreneurPulse.com.au  Fact Sheet for Healthcare Providers:  IncredibleEmployment.be  This test is no t yet approved or cleared by the [place] FDA and  has been authorized for detection and/or diagnosis of SARS-CoV-2 by FDA under an Emergency Use Authorization (EUA). This EUA will remain  in effect (meaning this test can be used) for the duration of the COVID-19 declaration under Section 564(b)(1) of the Act, 21 U.S.C.section 360bbb-3(b)(1), unless the authorization is terminated  or revoked sooner.       Influenza A by PCR NEGATIVE NEGATIVE Final   Influenza B by PCR NEGATIVE NEGATIVE Final    Comment: (NOTE) The Xpert Xpress SARS-CoV-2/FLU/RSV plus assay is intended as an aid in the diagnosis of influenza from Nasopharyngeal swab specimens and should not be used as a sole basis for treatment. Nasal washings  and aspirates are unacceptable for Xpert Xpress SARS-CoV-2/FLU/RSV testing.  Fact Sheet for Patients: EntrepreneurPulse.com.au  Fact Sheet for Healthcare Providers: IncredibleEmployment.be  This test is not yet approved or cleared by the [place] FDA and has been authorized for detection and/or diagnosis of SARS-CoV-2 by FDA under an Emergency Use Authorization (EUA). This EUA will remain in effect (meaning this test can be used) for the duration of the COVID-19 declaration under Section 564(b)(1) of the Act, 21 U.S.C. section 360bbb-3(b)(1), unless the authorization is terminated or revoked.     Resp Syncytial Virus by PCR NEGATIVE NEGATIVE Final    Comment: (NOTE) Fact Sheet for Patients: EntrepreneurPulse.com.au  Fact Sheet for Healthcare Providers: IncredibleEmployment.be  This test is not yet approved or cleared by the [place] FDA and has been authorized for detection and/or diagnosis of SARS-CoV-2 by FDA under an Emergency Use Authorization (EUA). This EUA will remain in effect (meaning this test can be used) for the duration of the COVID-19 declaration under Section 564(b)(1) of the Act, 21 U.S.C. section 360bbb-3(b)(1), unless the authorization is terminated or revoked.  Performed at [facility], [address], [place], [place] [address]      Radiological Exams on Admission: CT Head Wo Contrast  Result Date: 02/28/2022 CLINICAL DATA:  Altered mental status.  Hypoxia.  Recent pneumonia. EXAM: CT HEAD WITHOUT CONTRAST TECHNIQUE: Contiguous axial images were obtained from the base of the skull through the vertex without intravenous  contrast. RADIATION DOSE REDUCTION: This exam was performed according to the departmental dose-optimization program which includes automated exposure control, adjustment of the mA and/or kV according to patient size and/or use of iterative  reconstruction technique. COMPARISON:  09/24/2021 FINDINGS: Brain: Ventricles are slightly asymmetric left greater than right unchanged. Remaining cisterns and other CSF spaces are within normal. There is mild chronic ischemic microvascular disease present. There is no mass, mass effect, shift of midline structures or acute hemorrhage. No evidence of acute infarction. Vascular: No hyperdense vessel or unexpected calcification. Skull: Normal. Negative for fracture or focal lesion. Sinuses/Orbits: Orbits are normal symmetric. There is mild chronic inflammatory change of the paranasal sinuses involving the ethmoid, sphenoid, left maxillary sinuses and right frontal ethmoidal recess. Chronic opacification over the mastoid air cells bilaterally. Other: None. IMPRESSION: 1. No acute findings. 2. Mild chronic ischemic microvascular disease. 3. Mild chronic inflammatory change of the paranasal sinuses and mastoid air cells. Electronically Signed   By: Marin Olp M.D.   On: 02/28/2022 13:56   DG Chest Port 1 View  Result Date: 02/28/2022 CLINICAL DATA:  Shortness of breath. EXAM: PORTABLE CHEST 1 VIEW COMPARISON:  February 19, 2022 FINDINGS: Mediastinal contour is normal. The heart size is enlarged. Patchy consolidation of the left mid lung and left lung base are identified. Probable left pleural effusion. Minimal atelectasis of right lung base noted. Biapical pleural thickening noted. The visualized skeletal structures are stable. IMPRESSION: Patchy consolidation of the left mid lung and left lung base, pneumonia is not excluded. Electronically Signed   By: Abelardo Diesel M.D.   On: 02/28/2022 12:55      Assessment/Plan Principal Problem:   Aspiration pneumonia (HCC) Active Problems:   UTI (urinary tract infection)   Acute metabolic encephalopathy   Paroxysmal atrial fibrillation (HCC)   Pulmonary embolism (HCC)   Essential hypertension   Chronic diastolic CHF (congestive heart failure) (HCC)    Hypercholesteremia   Normocytic anemia   Acute renal failure superimposed on stage 3a chronic kidney disease (HCC)   Assessment and Plan:  Aspiration pneumonia [facility]): patient possibly has aspiration pneumonia due to altered mental status secondary to UTI.  - Will admit to tele bed as inpt - Unasyn - Mucinex for cough  - Bronchodilators - Follow up blood culture x2, sputum culture -Nasal cannula oxygen to maintain oxygen saturation above 93%  UTI (urinary tract infection) -on unasyn -f/u urine culture  Acute metabolic encephalopathy: CT head negative.  Possibly due to UTI -Frequent neurocheck -Fall precaution  Paroxysmal atrial fibrillation (HCC) -Continue Eliquis -Metoprolol  Pulmonary embolism (HCC) -On Eliquis  Essential hypertension -IV hydralazine as needed -Amlodipine, metoprolol -Hold irbesartan due to worsening renal function  Chronic diastolic CHF (congestive heart failure) ([place]): 2D echo on 11/17/2021 showed EF of 50-55%.  Patient has BNP 340, but only trace leg edema, does not seem to have CHF exacerbation. -Hold Lasix due to worsening renal function  Hypercholesteremia -Zocor  Normocytic anemia: Hemoglobin stable, 7.9 on 02/23/2022.  Her hemoglobin is 7.8 today -Follow-up with CBC  Acute renal failure superimposed on stage 3a chronic kidney disease ([place]): Likely due to UTI and dehydration. -Hold irbesartan and Lasix -Gentle IV fluid: 50 cc/h of normal saline     DVT ppx: on Eliquis  Code Status: DNR per her daughter  Family Communication:   Yes, patient's daughter and son-in law   at bed side.      Disposition Plan:  Anticipate discharge back to previous environment  Consults called:  none  Admission status and Level of care: Telemetry Medical:   as inpt      Dispo: The patient is from: Home              Anticipated d/c is to: Home              Anticipated d/c date is: 2 days              Patient currently is not medically stable to  d/c.    Severity of Illness:  The appropriate patient status for this patient is INPATIENT. Inpatient status is judged to be reasonable and necessary in order to provide the required intensity of service to ensure the patient's safety. The patient's presenting symptoms, physical exam findings, and initial radiographic and laboratory data in the context of their chronic comorbidities is felt to place them at high risk for further clinical deterioration. Furthermore, it is not anticipated that the patient will be medically stable for discharge from the hospital within 2 midnights of admission.   * I certify that at the point of admission it is my clinical judgment that the patient will require inpatient hospital care spanning beyond 2 midnights from the point of admission due to high intensity of service, high risk for further deterioration and high frequency of surveillance required.*       Date of Service 02/28/2022    Ivor Costa [facility]   If 7PM-7AM, please contact night-coverage [email or web address] 02/28/2022, 5:58 PM

## 2022-02-28 NOTE — Progress Notes (Signed)
Pharmacy Antibiotic Note  [name] is a 89 y.o. female admitted on 02/28/2022 with aspiration pneumonia Pharmacy has been consulted for Unasyn dosing.  Assessment: 89 yo F presents to the ED with a diagnosis of aspiration PNA.  WBC 10.8, Tmax 98.5, Scr 2.41   Plan: Unasyn 3 gm IV Q12H  Height: '5\' 2"'$  (157.5 cm) Weight: 72.3 kg (159 lb 6.3 oz) IBW/kg (Calculated) : 50.1  Temp (24hrs), Avg:98.5 F (36.9 C), Min:98.5 F (36.9 C), Max:98.5 F (36.9 C)  Recent Labs  Lab 02/22/22 0523 02/23/22 0421 02/28/22 1142 02/28/22 1215  WBC 8.4 8.3 10.8*  --   CREATININE 0.86 1.13* 2.41*  --   LATICACIDVEN  --   --   --  0.9    Estimated Creatinine Clearance: 14.2 mL/min (A) (by C-G formula based on SCr of 2.41 mg/dL (H)).    Allergies  Allergen Reactions   Fentanyl Other (See Comments)    confusion    Antimicrobials this admission: Unasyn 1/15 >>    Dose adjustments this admission: N/A  Microbiology results: 1/15 BCx: pending 1/15 UCx: pending  1/15 Sputum: ordered    Thank you for allowing pharmacy to be a part of this patient's care.  Alison Murray 02/28/2022 3:53 PM

## 2022-02-28 NOTE — ED Notes (Signed)
Dossie Der, MD on patient's blood pressure.

## 2022-02-28 NOTE — ED Triage Notes (Signed)
Arrives from home via [facility]>  initial oxygen sats 70's on RA per report, placed on 100% NRB.  Hx CHF, recent pneumonia, discharged from hospital on Wednesday.  Patient has a DNR.    Family reports patient was slow to wake up this morning, slow to respond today.  Lungs rhoncorous, respirations regular and non labored.  Takes Eliquis.

## 2022-03-01 ENCOUNTER — Encounter: Payer: Self-pay | Admitting: Internal Medicine

## 2022-03-01 DIAGNOSIS — D500 Iron deficiency anemia secondary to blood loss (chronic): Secondary | ICD-10-CM

## 2022-03-01 DIAGNOSIS — Z66 Do not resuscitate: Secondary | ICD-10-CM | POA: Insufficient documentation

## 2022-03-01 DIAGNOSIS — N179 Acute kidney failure, unspecified: Secondary | ICD-10-CM | POA: Diagnosis not present

## 2022-03-01 DIAGNOSIS — J690 Pneumonitis due to inhalation of food and vomit: Secondary | ICD-10-CM | POA: Diagnosis not present

## 2022-03-01 DIAGNOSIS — I10 Essential (primary) hypertension: Secondary | ICD-10-CM | POA: Diagnosis not present

## 2022-03-01 DIAGNOSIS — G2581 Restless legs syndrome: Secondary | ICD-10-CM

## 2022-03-01 DIAGNOSIS — I2782 Chronic pulmonary embolism: Secondary | ICD-10-CM

## 2022-03-01 LAB — URINE CULTURE: Culture: NO GROWTH

## 2022-03-01 LAB — CBC
HCT: 23.6 % — ABNORMAL LOW (ref 36.0–46.0)
Hemoglobin: 7.5 g/dL — ABNORMAL LOW (ref 12.0–15.0)
MCH: 29 pg (ref 26.0–34.0)
MCHC: 31.8 g/dL (ref 30.0–36.0)
MCV: 91.1 fL (ref 80.0–100.0)
Platelets: 431 10*3/uL — ABNORMAL HIGH (ref 150–400)
RBC: 2.59 MIL/uL — ABNORMAL LOW (ref 3.87–5.11)
RDW: 14.8 % (ref 11.5–15.5)
WBC: 12.8 10*3/uL — ABNORMAL HIGH (ref 4.0–10.5)
nRBC: 0 % (ref 0.0–0.2)

## 2022-03-01 LAB — BASIC METABOLIC PANEL
Anion gap: 9 (ref 5–15)
BUN: 25 mg/dL — ABNORMAL HIGH (ref 8–23)
CO2: 23 mmol/L (ref 22–32)
Calcium: 7.8 mg/dL — ABNORMAL LOW (ref 8.9–10.3)
Chloride: 101 mmol/L (ref 98–111)
Creatinine, Ser: 1.87 mg/dL — ABNORMAL HIGH (ref 0.44–1.00)
GFR, Estimated: 25 mL/min — ABNORMAL LOW (ref >60)
Glucose, Bld: 110 mg/dL — ABNORMAL HIGH (ref 70–99)
Potassium: 3.4 mmol/L — ABNORMAL LOW (ref 3.5–5.1)
Sodium: 133 mmol/L — ABNORMAL LOW (ref 135–145)

## 2022-03-01 LAB — PREPARE RBC (CROSSMATCH)

## 2022-03-01 LAB — CBG MONITORING, ED: Glucose-Capillary: 104 mg/dL — ABNORMAL HIGH (ref 70–99)

## 2022-03-01 MED ORDER — SODIUM CHLORIDE 0.9% IV SOLUTION
Freq: Once | INTRAVENOUS | Status: AC
  Filled 2022-03-01: qty 250

## 2022-03-01 NOTE — ED Notes (Addendum)
Family at bedside. Proved daughter brief update.

## 2022-03-01 NOTE — Evaluation (Signed)
Physical Therapy Evaluation Patient Details Name: [name] MRN: [id number] DOB: May 23, 1928 Today's Date: 03/01/2022  History of Present Illness  89 y/o female presented to ED on 02/28/22 for AMS and SOB. Recent admission 1/6-1/10 for PE. Now admitted for possible aspiration pneumonia and UTI. PMH: Afib, HTN, hx of PE, CHF, CKD stage 3a, PVD  Clinical Impression  Patient admitted with the above. Prior to this admission, patient has been essentially bedbound for ADLs and use of hoyer lift for OOB mobility. Patient was ambulating short household distances with rollator 2-3 months ago. Oriented to self and place this session. Presents with weakness, impaired balance, decreased activity tolerance, and impaired cognition. Required max-totalA+2 for bed mobility and min guard-maxA to maintain sitting balance due to L lateral lean. Able to progress to min guard with cues for R lateral lean on elbow to achieve midline. VSS on 2L O2 during session. Family hopeful for patient to progress to being able to complete stand pivot transfer with assistance for OOB mobility to not have to use the hoyer lift. Patient will benefit from skilled PT services during acute stay to address listed deficits. Recommend HHPT at discharge as family and aides are able to provide necessary assistance at home.        Recommendations for follow up therapy are one component of a multi-disciplinary discharge planning process, led by the attending physician.  Recommendations may be updated based on patient status, additional functional criteria and insurance authorization.  Follow Up Recommendations Home health PT      Assistance Recommended at Discharge Frequent or constant Supervision/Assistance  Patient can return home with the following  Two people to help with walking and/or transfers;Two people to help with bathing/dressing/bathroom;Assistance with cooking/housework;Assistance with feeding;Direct supervision/assist for  medications management;Direct supervision/assist for financial management;Assist for transportation;Help with stairs or ramp for entrance    Equipment Recommendations None recommended by PT  Recommendations for Other Services       Functional Status Assessment Patient has had a recent decline in their functional status and demonstrates the ability to make significant improvements in function in a reasonable and predictable amount of time.     Precautions / Restrictions Precautions Precautions: Fall      Mobility  Bed Mobility Overal bed mobility: Needs Assistance Bed Mobility: Supine to Sit, Sit to Supine     Supine to sit: Max assist, +2 for physical assistance, +2 for safety/equipment Sit to supine: Total assist, +2 for physical assistance, +2 for safety/equipment   General bed mobility comments: able to move LEs towards EOB with max cueing. Required maxA+2 to complete bed mobility to EOB    Transfers                   General transfer comment: deferred due to stretcher height and poor sitting balance    Ambulation/Gait                  Stairs            Wheelchair Mobility    Modified Rankin (Stroke Patients Only)       Balance Overall balance assessment: Needs assistance Sitting-balance support: Bilateral upper extremity supported, Feet unsupported Sitting balance-Leahy Scale: Poor Sitting balance - Comments: L lateral lean in stting requiring up to maxA to maintain balance. With cues for R lateral lean on elbow, patient able to maintain midline posture with min guard Postural control: Left lateral lean  Pertinent Vitals/Pain Pain Assessment Pain Assessment: PAINAD Breathing: normal Negative Vocalization: none Facial Expression: smiling or inexpressive Body Language: relaxed Consolability: no need to console PAINAD Score: 0 Pain Intervention(s): Monitored during session    Home  Living Family/patient expects to be discharged to:: Private residence Living Arrangements: Spouse/significant other Available Help at Discharge: Family;Available 24 hours/day;Available PRN/intermittently Type of Home: House Home Access: Stairs to enter;Ramped entrance   Entrance Stairs-Number of Steps: 1   Home Layout: One level Home Equipment: Rollator (4 wheels);Shower seat;Hand held shower head;Other (comment);BSC/3in1 Product manager)      Prior Function Prior Level of Function : Needs assist;History of Falls (last six months)             Mobility Comments: Prior to most recent admission, patient has been essentially bedbound with use of hoyer for OOB transfers. 2-3 months ago, patient was able to ambulate short household distances with 4ww. ADLs Comments: bed level for ADLs with totalA     Hand Dominance        Extremity/Trunk Assessment   Upper Extremity Assessment Upper Extremity Assessment: Defer to OT evaluation    Lower Extremity Assessment Lower Extremity Assessment: Generalized weakness    Cervical / Trunk Assessment Cervical / Trunk Assessment: Kyphotic  Communication   Communication: HOH  Cognition Arousal/Alertness: Awake/alert Behavior During Therapy: WFL for tasks assessed/performed Overall Cognitive Status: Impaired/Different from baseline Area of Impairment: Orientation, Attention, Awareness, Problem solving                 Orientation Level: Disoriented to, Time, Situation Current Attention Level: Sustained       Awareness: Intellectual Problem Solving: Slow processing, Requires verbal cues, Requires tactile cues          General Comments General comments (skin integrity, edema, etc.): VSS on 2L O2    Exercises     Assessment/Plan    PT Assessment Patient needs continued PT services  PT Problem List Decreased strength;Decreased activity tolerance;Decreased mobility;Decreased balance;Decreased cognition;Decreased knowledge of use of  DME;Decreased safety awareness;Decreased knowledge of precautions       PT Treatment Interventions DME instruction;Functional mobility training;Therapeutic activities;Balance training;Therapeutic exercise;Patient/family education    PT Goals (Current goals can be found in the Care Plan section)  Acute Rehab PT Goals Patient Stated Goal: daughter's goal is for patient to be able to stand pivot with assistance PT Goal Formulation: With patient/family Time For Goal Achievement: 03/15/22 Potential to Achieve Goals: Fair    Frequency Min 2X/week     Co-evaluation PT/OT/SLP Co-Evaluation/Treatment: Yes Reason for Co-Treatment: For patient/therapist safety;Necessary to address cognition/behavior during functional activity;To address functional/ADL transfers PT goals addressed during session: Mobility/safety with mobility;Balance         AM-PAC PT "6 Clicks" Mobility  Outcome Measure Help needed turning from your back to your side while in a flat bed without using bedrails?: Total Help needed moving from lying on your back to sitting on the side of a flat bed without using bedrails?: Total Help needed moving to and from a bed to a chair (including a wheelchair)?: Total Help needed standing up from a chair using your arms (e.g., wheelchair or bedside chair)?: Total Help needed to walk in hospital room?: Total Help needed climbing 3-5 steps with a railing? : Total 6 Click Score: 6    End of Session Equipment Utilized During Treatment: Oxygen Activity Tolerance: Patient tolerated treatment well Patient left: in bed;with call bell/phone within reach;with bed alarm set;with family/visitor present Nurse Communication: Mobility status PT Visit  Diagnosis: Unsteadiness on feet (R26.81);Muscle weakness (generalized) (M62.81);Other abnormalities of gait and mobility (R26.89)    Time: 3888-7579 PT Time Calculation (min) (ACUTE ONLY): 24 min   Charges:   PT Evaluation $PT Eval Moderate  Complexity: 1 Mod          Clyde Zarrella A. Gilford Rile PT, DPT [facility] - Acute Rehabilitation Services   Tanyiah Laurich A Haddie Bruhl 03/01/2022, 12:59 PM

## 2022-03-01 NOTE — Evaluation (Signed)
Occupational Therapy Evaluation Patient Details Name: [name] MRN: [id number] DOB: 1928/11/08 Today's Date: 03/01/2022   History of Present Illness 89 y/o female presented to ED on 02/28/22 for AMS and SOB. Recent admission 1/6-1/10 for PE. Now admitted for possible aspiration pneumonia and UTI. PMH: Afib, HTN, hx of PE, CHF, CKD stage 3a, PVD   Clinical Impression   Pt agreeable to OT/PT co-treatment to maximize safety and participation. Family present and providing PLOF. Pt A&Ox2 this date. PTA pt lived with spouse. She was requiring total A for bed level ADLs and hoyer lift for OOB mobility. Pt currently has 24/7 supervision/assistance with [employer] aide 7 days/wk and daughters checking on pt daily. Pt currently functioning at Max A +2 for supine to sit, total A +2 for sit to supine, and CGA-Max A to maintain static sitting balance on edge of stretcher. Pt is currently requiring Max-total A for self-care tasks. Pt will benefit from acute OT to increase overall independence in the areas of ADLs and functional mobility in order to safely discharge home. Pt could benefit from [facility] following D/C to decrease falls risk, improve balance, and maximize independence in self-care within own home environment.    Recommendations for follow up therapy are one component of a multi-disciplinary discharge planning process, led by the attending physician.  Recommendations may be updated based on patient status, additional functional criteria and insurance authorization.   Follow Up Recommendations  Home health OT     Assistance Recommended at Discharge Frequent or constant Supervision/Assistance  Patient can return home with the following Two people to help with bathing/dressing/bathroom;Two people to help with walking and/or transfers;Direct supervision/assist for medications management;Direct supervision/assist for financial management;Assistance with cooking/housework;Assist for transportation;Help with  stairs or ramp for entrance;Assistance with feeding    Functional Status Assessment  Patient has had a recent decline in their functional status and demonstrates the ability to make significant improvements in function in a reasonable and predictable amount of time.  Equipment Recommendations  None recommended by OT    Recommendations for Other Services       Precautions / Restrictions Precautions Precautions: Fall Restrictions Weight Bearing Restrictions: No      Mobility Bed Mobility Overal bed mobility: Needs Assistance Bed Mobility: Supine to Sit, Sit to Supine     Supine to sit: Max assist, +2 for physical assistance, +2 for safety/equipment Sit to supine: Total assist, +2 for physical assistance, +2 for safety/equipment   General bed mobility comments: able to move LEs towards EOB with max multimodal cues    Transfers                   General transfer comment: deferred due to stretcher height and poor sitting balance      Balance Overall balance assessment: Needs assistance Sitting-balance support: Bilateral upper extremity supported, Feet unsupported Sitting balance-Leahy Scale: Poor Sitting balance - Comments: L lateral lean in stting requiring up to maxA to maintain balance. With cues for R lateral lean on elbow, patient able to maintain midline posture with min guard Postural control: Left lateral lean                                 ADL either performed or assessed with clinical judgement   ADL Overall ADL's : Needs assistance/impaired  General ADL Comments: Pt currently requiring Max-total A for ADL tasks     Vision Patient Visual Report: No change from baseline       Perception     Praxis      Pertinent Vitals/Pain Pain Assessment Pain Assessment: Faces Faces Pain Scale: No hurt     Hand Dominance     Extremity/Trunk Assessment Upper Extremity Assessment Upper  Extremity Assessment: Generalized weakness (BUE shoulder flexion AROM limited to ~90 deg)   Lower Extremity Assessment Lower Extremity Assessment: Generalized weakness   Cervical / Trunk Assessment Cervical / Trunk Assessment: Kyphotic   Communication Communication Communication: HOH   Cognition Arousal/Alertness: Awake/alert Behavior During Therapy: WFL for tasks assessed/performed Overall Cognitive Status: Impaired/Different from baseline Area of Impairment: Orientation, Attention, Awareness, Problem solving                 Orientation Level: Disoriented to, Time, Situation Current Attention Level: Sustained       Awareness: Intellectual Problem Solving: Slow processing, Requires verbal cues, Requires tactile cues General Comments: Daughter reports A&Ox3 at baseline, however, increased confusion recently which happens every time coming to hospital.     General Comments  VSS on 2L O2    Exercises Other Exercises Other Exercises: OT provided education re: role of OT, OT POC, post acute recs, sitting up for all meals, EOB/OOB mobility with assistance, home/fall safety.   Education provided re: use of flutter valve (acapella) and incentive spirometer. Pt verbalized understanding and completed x10 each. Encouraged pt to complete several times throughout the day when sitting up in bed or chair.  Education provided re: visual deficits, _ inattention, visual compensatory strategies (anchoring, external guides, head turns, lighthouse technique)    Shoulder Instructions      Home Living Family/patient expects to be discharged to:: Private residence Living Arrangements: Spouse/significant other Available Help at Discharge: Family;Available 24 hours/day;Available PRN/intermittently Type of Home: House Home Access: Stairs to enter;Ramped entrance Entrance Stairs-Number of Steps: 1   Home Layout: One level     Bathroom Shower/Tub: Occupational psychologist:  Handicapped height     Home Equipment: Rollator (4 wheels);Shower seat;Hand held shower head;Other (comment);BSC/3in1 (hoyer lift)          Prior Functioning/Environment Prior Level of Function : Needs assist;History of Falls (last six months)             Mobility Comments: Prior to most recent admission, patient has been essentially bedbound with use of hoyer for OOB transfers. 2-3 months ago, patient was able to ambulate short household distances with 4ww. ADLs Comments: Bed level for ADLs with total A. Per daughter, pt has aide 7 days/wk in the morning and afternoon, daughters check on pt daily to assist with ADLs/IADLs. Incontinence at baseline, wears briefs. Was getting [facility] PT/OT prior to admission.         OT Problem List: Decreased strength;Decreased range of motion;Decreased activity tolerance;Impaired balance (sitting and/or standing);Decreased cognition;Decreased safety awareness;Decreased knowledge of use of DME or AE;Decreased knowledge of precautions      OT Treatment/Interventions: Self-care/ADL training;Therapeutic exercise;Therapeutic activities;Cognitive remediation/compensation;DME and/or AE instruction;Patient/family education;Balance training    OT Goals(Current goals can be found in the care plan section) Acute Rehab OT Goals Patient Stated Goal: daughter's goal is for patient to be able to stand pivot with assistance OT Goal Formulation: With patient/family Time For Goal Achievement: 03/15/22 Potential to Achieve Goals: Fair   OT Frequency: Min 2X/week    Co-evaluation PT/OT/SLP Co-Evaluation/Treatment: Yes Reason  for Co-Treatment: For patient/therapist safety;Necessary to address cognition/behavior during functional activity;To address functional/ADL transfers PT goals addressed during session: Mobility/safety with mobility;Balance OT goals addressed during session: ADL's and self-care      AM-PAC OT "6 Clicks" Daily Activity     Outcome Measure Help  from another person eating meals?: A Lot Help from another person taking care of personal grooming?: A Lot Help from another person toileting, which includes using toliet, bedpan, or urinal?: Total Help from another person bathing (including washing, rinsing, drying)?: Total Help from another person to put on and taking off regular upper body clothing?: A Lot Help from another person to put on and taking off regular lower body clothing?: Total 6 Click Score: 9   End of Session Equipment Utilized During Treatment: Oxygen Nurse Communication: Mobility status  Activity Tolerance: Patient tolerated treatment well Patient left: in bed;with call bell/phone within reach;with bed alarm set;with family/visitor present  OT Visit Diagnosis: Unsteadiness on feet (R26.81);Muscle weakness (generalized) (M62.81)                Time: 7209-4709 OT Time Calculation (min): 24 min Charges:  OT General Charges $OT Visit: 1 Visit OT Evaluation $OT Eval Moderate Complexity: 1 Mod  [facility] MS, OTR/L ascom [phone]  03/01/22, 1:31 PM

## 2022-03-01 NOTE — Evaluation (Signed)
Clinical/Bedside Swallow Evaluation Patient Details  Name: [name] MRN: [id number] Date of Birth: 07-31-1928  Today's Date: 03/01/2022 Time: SLP Start Time (ACUTE ONLY): 1500 SLP Stop Time (ACUTE ONLY): 1600 SLP Time Calculation (min) (ACUTE ONLY): 60 min  Past Medical History:  Past Medical History:  Diagnosis Date   Anemia    Barrett's esophagus    BCC (basal cell carcinoma of skin) 03/07/2013   vertex scalp - NODULAR NODULAR PATTERN PATTERN   Bowel obstruction (HCC)    s/p adhesion resection   CHF (congestive heart failure) (HCC)    Chronic back pain    Colon cancer ([place]) 1988 and 1989   adenocarcinoma, s/p resection x  2 and chemo   Diverticulitis    GERD (gastroesophageal reflux disease)    H/O open leg wound    Hiatal hernia    Hypercholesteremia    Hypertension    Lumbar scoliosis    OA (osteoarthritis)    Peripheral neuropathy    Recurrent sinus infections    Renal cyst    S/P chemotherapy, time since greater than 12 weeks    colon cancer   Vertigo    Past Surgical History:  Past Surgical History:  Procedure Laterality Date   ABDOMINAL HYSTERECTOMY  1982   adhesions resected     bowel obstruction   APPENDECTOMY     BREAST BIOPSY Left    negative 06/14/1985   CHOLECYSTECTOMY  1994   COLON RESECTION     x2.  s/p colon cancer   COLON SURGERY  1958 and 1989   For Colon Cancer   EXCISIONAL HEMORRHOIDECTOMY     with tubal ligation   EXPLORATORY LAPAROTOMY  1991   Secondary to SBO   HPI:  Pt is a 89 y/o female presented to ED on 02/28/22 for AMS and SOB. Recent admission 1/6-1/10 for PE and pneumonia then, per Daughter/chart notes. Currently admitted for pneumonia and UTI.  PMH: LARGE Hiatal Hernia, Afib, HTN, hx of PE, CHF, CKD stage 3a, PVD.  Recently hospitalized from 1/6-1/10 for influenza B and bacterial pneumonia and discharged on p.o. Levaquin.  CXR: Patchy consolidation of the left mid lung and left lung base,  pneumonia is not excluded.  Reports  productive cough with whitish phlegm.    Assessment / Plan / Recommendation  Clinical Impression    Pt seen for BSE this morning. Daughter present and gave information on pt's status in the home: more bed/chair-bound in the past 1 month since d/c from SNF/[facility] -- Dtr would like pt to return there at this D/C. MD consulted for TOC eval. Pt A/O x3; engaged verbally to answer basic questions re: self and followed basic commands w/ cue. Min HOH(?). Intermittent, congested cough noted at Baseline in bed PRIOR to po's given. Noted UE tremors bilaterally - min increased per Dtr since hospitalization/illness. On [place] O2 support- 2L; afebrile, WBC elevated.    OF NOTE: Pt has a Baseline of a Large Hiatal Hernia w/ reports of belching and potential retrograde backflow of po's at times at home. Any such Esophageal phase Dysmotility can increase risk for aspiration of REFLUX material. Pt is also more bed/chair-bound and leans to her Left side chronically per Dtr.   Pt appears to present w/ functional oropharyngeal phase swallowing w/ no immediate, overt oropharyngeal phase swallowing deficits nor change/decline in respiratory status noted during po intake -- Daughter reported same during breakfast meal today. Pt appears at reduced risk for aspiration from an oropharyngeal phase standpoint  when following general aspiration precautions.  HOWEVER, pt has a baseline presentation of LARGE Hiatal Hernia and REFLUX w/ Sedentary physical status at home in recent month. ANY Dysmotility or Regurgitation of Reflux material can increase risk for aspiration of the Reflux material during Retrograde backflow thus impact Voicing and Pulmonary status, including aspiration pneumonia especially if more sedentary. Recommend getting out of bed for meals daily.    Pt sat upright in bed once positioned midline and forward and consumed several trials of thin liquids Via Cup, purees, and softened solid foods w/ No immediate, overt  clinical s/s of aspiration noted; clear vocal quality b/t trials, no decline in pulmonary status, no multiple swallows noted post initial pharyngeal swallow, no decline in O2 sats(96%). Intermittent mild, congested cough (x2-3) noted b/t trials -- similar to baseline cough but did NOT increase in intensity or nature as po trials continued. Oral phase appeared [facility] for bolus management and timely A-P transfer/clearing of material. Mastication adequate for boluses(softened) but min Time given b/t trials for pt to fully clear to overall effort.  OM exam was [facility] for oral clearing; lingual/labial movements. No unilateral weakness. Speech clear.    Recommend a more Mech Soft diet w/ Meats MINCED (moistened foods) w/ thin liquids via Cup - no straw d/t air swallowed. General aspiration precautions and support w/ feeding at meals d/t UE tremors. Rest Breaks during meals/oral intake to allow for Esophageal clearing. STRICT REFLUX precautions strongly recommended to lessen chance for Regurgitation -- HOB elevated at night when sleeping.    Recommend pt f/u w/ GI for assessment/management and Education of Esophageal phase Dysmotility as indicated. Discussion given on REFLUX, impact of Esophageal Dysmotility on swallowing and breathing, behaviors to manage dysmotility, and foods in diet. MD to reconsult ST services if any new needs while admitted. NSG updated. Pt and Daughter appreciative of Education information.  SLP Visit Diagnosis: Dysphagia, pharyngoesophageal phase (R13.14) (baseline Large Hiatal Hernia)    Aspiration Risk  Mild aspiration risk;Risk for inadequate nutrition/hydration (reduced when following aspiration and REFLUX precautions)    Diet Recommendation   a more Mech Soft diet w/ Meats MINCED (moistened foods) w/ thin liquids via Cup - no straw d/t air swallowed. General aspiration precautions and support w/ feeding at meals d/t UE tremors. Rest Breaks during meals/oral intake to allow for Esophageal  clearing. STRICT REFLUX precautions strongly recommended to lessen chance for Regurgitation -- HOB elevated at night when sleeping.  Medication Administration: Whole meds with puree (vs Crushed in puree)    Other  Recommendations Recommended Consults: Consider GI evaluation;Consider esophageal assessment (for education; Palliative Care consult for [place]; Dietician f/u) Oral Care Recommendations: Oral care BID;Oral care before and after PO;Staff/trained caregiver to provide oral care Other Recommendations:  (n/a)    Recommendations for follow up therapy are one component of a multi-disciplinary discharge planning process, led by the attending physician.  Recommendations may be updated based on patient status, additional functional criteria and insurance authorization.  Follow up Recommendations No SLP follow up (appears at her baseline)      Assistance Recommended at Discharge  Intermittent at meals  Functional Status Assessment Patient has had a recent decline in their functional status and/or demonstrates limited ability to make significant improvements in function in a reasonable and predictable amount of time  Frequency and Duration  (n/a)   (n/a)       Prognosis Prognosis for Safe Diet Advancement: Fair Barriers to Reach Goals: Cognitive deficits;Time post onset;Severity of deficits Barriers/Prognosis Comment:  Large Hiatal Hernia; need for assistance      Swallow Study   General Date of Onset: 02/28/22 HPI: Pt is a 89 y/o female presented to ED on 02/28/22 for AMS and SOB. Recent admission 1/6-1/10 for PE and pneumonia then, per Daughter/chart notes. Currently admitted for pneumonia and UTI.  PMH: LARGE Hiatal Hernia, Afib, HTN, hx of PE, CHF, CKD stage 3a, PVD.  Recently hospitalized from 1/6-1/10 for influenza B and bacterial pneumonia and discharged on p.o. Levaquin.  CXR: Patchy consolidation of the left mid lung and left lung base,  pneumonia is not excluded.  Reports productive  cough with whitish phlegm. Type of Study: Bedside Swallow Evaluation Previous Swallow Assessment: 12/2021 Diet Prior to this Study: Regular;Thin liquids (cut foods) Temperature Spikes Noted: No (wbc 12.8) Respiratory Status: Nasal cannula (2L) History of Recent Intubation: No Behavior/Cognition: Alert;Cooperative;Pleasant mood;Distractible;Requires cueing (Dtr present) Oral Cavity Assessment: Within Functional Limits Oral Care Completed by SLP: Yes Oral Cavity - Dentition: Adequate natural dentition Vision: Functional for self-feeding Self-Feeding Abilities: Able to feed self;Needs assist;Needs set up;Total assist (Shaky UEs bilat) Patient Positioning: Upright in bed (needed positioning support; chronic Left side leaner) Baseline Vocal Quality: Normal;Low vocal intensity Volitional Cough: Strong;Congested Volitional Swallow: Able to elicit    Oral/Motor/Sensory Function Overall Oral Motor/Sensory Function: Within functional limits   Ice Chips Ice chips: Not tested   Thin Liquid Thin Liquid: Within functional limits Presentation: Cup;Self Fed (~3-4 ozs) Other Comments: intermittent mild, congested cough (x2) b/t trials -- similar to baseline and NOT increased in intensity or nature    Nectar Thick Nectar Thick Liquid: Not tested   Honey Thick Honey Thick Liquid: Not tested   Puree Puree: Within functional limits Presentation: Spoon (fed; 10 trials) Other Comments: intermittent mild, congested cough (x1) b/t trials -- similar to baseline and NOT increased in intensity or nature   Solid     Solid: Impaired (slightly increased time) Presentation: Spoon (fed; 5 trials) Oral Phase Functional Implications: Prolonged oral transit (slight) Pharyngeal Phase Impairments:  (none) Other Comments: intermittent mild, congested cough (x1) b/t trials -- similar to baseline and NOT increased in intensity or nature       Orinda Kenner, MS, CCC-SLP Speech Language Pathologist Rehab  Services; [place] [phone] (ascom) Niaomi Cartaya 03/01/2022,5:48 PM

## 2022-03-01 NOTE — Progress Notes (Signed)
PROGRESS NOTE  RUSSIE GULLEDGE VZD:[id number] DOB: [birth year]   PCP: [name], MD  Patient is from: Home.   DOA: 02/28/2022 LOS: 1  Chief complaints Altered mental status,    Brief Narrative / Interim history: 89 year old F with PMH of diastolic CHF, A-fib and PE on Eliquis, HTN, HLD, hereditary hemochromatosis, colon cancer s/p surgical resection and chemo, GERD, ambulatory dysfunction and incontinence presenting with altered mental status, shortness of breath and productive cough with whitish phlegm and admitted for aspiration pneumonia, acute metabolic encephalopathy and AKI.  Recently hospitalized from 1/6-1/10 for influenza B and bacterial pneumonia and discharged on p.o. Levaquin.  In ED, CT head without acute finding.  CXR showed patchy infiltrate in left midlung and left lung base concerning for pneumonia.  She had mild leukocytosis to 10.8.  BNP 340.  She was started on Unasyn and admitted.   Subjective: Seen and examined earlier this morning.  She was a little hypotensive overnight.  Normotensive this morning.  Feels better.  Family member including 2 daughters and son-in-law at bedside.  Feels weak.  Reports productive cough with whitish phlegm.  Patient's daughter reports dark looking stool.  Last bowel movement about 3 days ago.  Denies urinary habit change.  Objective: Vitals:   03/01/22 0700 03/01/22 0722 03/01/22 0800 03/01/22 1031  BP: (!) 92/49  108/74 (!) 126/101  Pulse: 86  81 90  Resp: 13  (!) 23 19  Temp:    98.2 F (36.8 C)  TempSrc:    Axillary  SpO2: 100% 98% 99% 100%  Weight:      Height:        Examination:  GENERAL: No apparent distress.  Noted reflux/regurgitation during exam HEENT: MMM.  Vision and hearing grossly intact.  NECK: Supple.  No apparent JVD.  RESP:  No IWOB.  Fair aeration bilaterally.  Crackles over left lung. CVS:  RRR. Heart sounds normal.  ABD/GI/GU: BS+. Abd soft, NTND.  MSK/EXT:  Moves extremities.  Skin bruises.   Trace BLE edema. SKIN: Skin bruises in lower extremities. NEURO: Awake and oriented to self, family, place and month.  Follows commands.  No apparent focal neuro deficit. PSYCH: Calm. Normal affect.   Procedures:  None  Microbiology summarized: [id number], influenza and RSV PCR nonreactive. Blood cultures NGTD Urine culture pending  Assessment and plan: Principal Problem:   Aspiration pneumonia ([place]) Active Problems:   Blood loss anemia   Acute metabolic encephalopathy   Paroxysmal atrial fibrillation (H[facility])   Pulmonary embolism (H[facility])   Essential hypertension   Chronic diastolic CHF (congestive heart failure) (HCC)   Hypercholesteremia   Restless legs syndrome   Normocytic anemia   Acute renal failure superimposed on stage 3a chronic kidney disease ([place])   DNR (do not resuscitate)  Aspiration pneumonia: seems to have reflux/regurgitation that could increase risk of aspiration.  She has productive cough with whitish phlegm.  CXR with left midlung and left lung base infiltrate.  COVID-19, influenza and RSV PCR nonreactive.  Blood cultures NGTD. -Continue IV Unasyn -Aspiration precaution -Continue bronchodilators -SLP eval -Follow sputum culture -Wean off oxygen.  Incentive spirometry   Acute metabolic encephalopathy: CT head negative.  Could be due to pneumonia.  Does not seem to be on sedating medications.  She has no focal neurosymptoms.  She is awake and oriented to self, family, place and month.  -Reorientation and delirium precautions. -Minimize or avoid sedating medications   Paroxysmal atrial fibrillation: Rate controlled. -Continue metoprolol and Eliquis   Symptomatic blood  loss anemia: Family reports significant blood loss after laceration to her right arm about a week ago.  Bleeding stopped after cauterization.  Family also reports dark stool but no hematochezia.  Patient is on Eliquis. Recent Labs    12/17/21 0643 12/20/21 0545 12/21/21 0654 02/19/22 1546  02/20/22 0458 02/21/22 0546 02/22/22 0523 02/23/22 0421 02/28/22 1142 03/01/22 0411  HGB 9.8* 10.5* 11.4* 10.7* 9.5* 8.9* 7.7* 7.9* 7.8* 7.5*  -Transfuse 1 unit.  Risk and benefits discussed with patient and family.  Verbal consent obtained. -Won't hold Eliquis or do Hemoccult unless significant drop in Hgb or overt GI bleed -Monitor H&H.  Chronic diastolic CHF: TTE in 16/0109 with LVEF  of 50-55% and normal diastolic function although she had G2 DD on a echo in 2022.Marland Kitchen  Patient has BNP 340 (lower than prior value on 1/6).  She has trace BLE edema.  Otherwise appears euvolemic. -Discontinue IV -Hold diuretics for now   Essential hypertension: Had some hypotensive episode last night.  Normotensive this morning. -Hold amlodipine and Avapro. -Continue metoprolol  AKI on CKD-3A: Baseline Cr 0.7-0.8.  Improving. Recent Labs    12/17/21 0643 12/20/21 0545 12/21/21 0654 02/19/22 1546 02/20/22 0458 02/21/22 0546 02/22/22 0523 02/23/22 0421 02/28/22 1142 03/01/22 0411  BUN 38* 28* 25* '20 19 19 19 22 '$ 28* 25*  CREATININE 1.12* 0.97 1.15* 0.95 0.84 0.73 0.86 1.13* 2.41* 1.87*  -Monitor off IV fluid. -Continue holding diuretics and Avapro.  Possible dysphagia/regurgitation/history of Barrett's esophagus -Continue PPI -Aspiration precaution and SLP eval  History of PE -On Eliquis  UTI ruled out.  Patient has chronic incontinence.  No new urinary habits.  UA does not suggest UTI.  Regardless, she has coverage on current antibiotics for pneumonia   Hypercholesteremia -Zocor  Goal of care: DNR/DNI.  Patient lives with family.  Not ambulatory.  Dependent for most ADLs   Body mass index is 29.15 kg/m.   Pressure skin injury: POA Pressure Injury 02/19/22 Buttocks redness and breakdown on the buttocks (Active)  02/19/22 2200  Location: Buttocks  Location Orientation:   Staging:   Wound Description (Comments): redness and breakdown on the buttocks  Present on Admission: Yes   Dressing Type Foam - Lift dressing to assess site every shift 02/22/22 0730   DVT prophylaxis:   apixaban (ELIQUIS) tablet 5 mg  Code Status: DNR/DNI Family Communication: Updated patient's daughters and son-in-law at bedside Level of care: Telemetry Medical Status is: Inpatient Remains inpatient appropriate because: Aspiration pneumonia, AKI and encephalopathy   Final disposition: TBD Consultants:  None  55 minutes with more than 50% spent in reviewing records, counseling patient/family and coordinating care.   Sch Meds:  Scheduled Meds:  sodium chloride   Intravenous Once   apixaban  5 mg Oral BID   calcium carbonate  500 mg of elemental calcium Oral BID WC   cholecalciferol  2,000 Units Oral Daily   diclofenac Sodium  2 g Topical TID   docusate sodium  100 mg Oral BID   gabapentin  200 mg Oral QHS   ipratropium-albuterol  3 mL Nebulization Q4H   metoprolol succinate  25 mg Oral Daily   pantoprazole  40 mg Oral BID   simvastatin  10 mg Oral QHS   Continuous Infusions:  ampicillin-sulbactam (UNASYN) IV Stopped (03/01/22 0437)   PRN Meds:.acetaminophen, acetaminophen, albuterol, dextromethorphan-guaiFENesin, hydrALAZINE, ondansetron (ZOFRAN) IV  Antimicrobials: Anti-infectives (From admission, onward)    Start     Dose/Rate Route Frequency Ordered Stop   02/28/22 1600  Ampicillin-Sulbactam (UNASYN) 3 g in sodium chloride 0.9 % 100 mL IVPB        3 g 200 mL/hr over 30 Minutes Intravenous Every 12 hours 02/28/22 1552          I have personally reviewed the following labs and images: CBC: Recent Labs  Lab 02/23/22 0421 02/28/22 1142 03/01/22 0411  WBC 8.3 10.8* 12.8*  NEUTROABS  --  7.8*  --   HGB 7.9* 7.8* 7.5*  HCT 24.8* 24.9* 23.6*  MCV 91.2 91.9 91.1  PLT 369 507* 431*   BMP &GFR Recent Labs  Lab 02/23/22 0421 02/28/22 1142 03/01/22 0411  NA 133* 131* 133*  K 3.9 3.5 3.4*  CL 100 97* 101  CO2 '24 22 23  '$ GLUCOSE 111* 98 110*  BUN 22 28* 25*   CREATININE 1.13* 2.41* 1.87*  CALCIUM 8.1* 8.2* 7.8*  MG 2.2  --   --    Estimated Creatinine Clearance: 18.3 mL/min (A) (by C-G formula based on SCr of 1.87 mg/dL (H)). Liver & Pancreas: No results for input(s): "AST", "ALT", "ALKPHOS", "BILITOT", "PROT", "ALBUMIN" in the last 168 hours. No results for input(s): "LIPASE", "AMYLASE" in the last 168 hours. No results for input(s): "AMMONIA" in the last 168 hours. Diabetic: No results for input(s): "HGBA1C" in the last 72 hours. Recent Labs  Lab 02/28/22 1142 03/01/22 0812  GLUCAP 92 104*   Cardiac Enzymes: No results for input(s): "CKTOTAL", "CKMB", "CKMBINDEX", "TROPONINI" in the last 168 hours. No results for input(s): "PROBNP" in the last 8760 hours. Coagulation Profile: Recent Labs  Lab 02/22/22 1413  INR 1.5*   Thyroid Function Tests: No results for input(s): "TSH", "T4TOTAL", "FREET4", "T3FREE", "THYROIDAB" in the last 72 hours. Lipid Profile: No results for input(s): "CHOL", "HDL", "LDLCALC", "TRIG", "CHOLHDL", "LDLDIRECT" in the last 72 hours. Anemia Panel: No results for input(s): "VITAMINB12", "FOLATE", "FERRITIN", "TIBC", "IRON", "RETICCTPCT" in the last 72 hours. Urine analysis:    Component Value Date/Time   COLORURINE AMBER (A) 02/28/2022 1215   APPEARANCEUR CLOUDY (A) 02/28/2022 1215   LABSPEC 1.016 02/28/2022 1215   PHURINE 5.0 02/28/2022 1215   GLUCOSEU NEGATIVE 02/28/2022 1215   GLUCOSEU NEGATIVE 04/04/2018 1255   HGBUR MODERATE (A) 02/28/2022 1215   BILIRUBINUR NEGATIVE 02/28/2022 1215   KETONESUR NEGATIVE 02/28/2022 1215   PROTEINUR 30 (A) 02/28/2022 1215   UROBILINOGEN 0.2 04/04/2018 1255   NITRITE NEGATIVE 02/28/2022 1215   LEUKOCYTESUR NEGATIVE 02/28/2022 1215   Sepsis Labs: Invalid input(s): "PROCALCITONIN", "LACTICIDVEN"  Microbiology: Recent Results (from the past 240 hour(s))  Resp panel by RT-PCR (RSV, Flu A&B, Covid) Anterior Nasal Swab     Status: Abnormal   Collection Time:  02/19/22  3:46 PM   Specimen: Anterior Nasal Swab  Result Value Ref Range Status   SARS Coronavirus 2 by RT PCR NEGATIVE NEGATIVE Final    Comment: (NOTE) SARS-CoV-2 target nucleic acids are NOT DETECTED.  The SARS-CoV-2 RNA is generally detectable in upper respiratory specimens during the acute phase of infection. The lowest concentration of SARS-CoV-2 viral copies this assay can detect is 138 copies/mL. A negative result does not preclude SARS-Cov-2 infection and should not be used as the sole basis for treatment or other patient management decisions. A negative result may occur with  improper specimen collection/handling, submission of specimen other than nasopharyngeal swab, presence of viral mutation(s) within the areas targeted by this assay, and inadequate number of viral copies(<138 copies/mL). A negative result must be combined with clinical observations, patient history,  and epidemiological information. The expected result is Negative.  Fact Sheet for Patients:  EntrepreneurPulse.com.au  Fact Sheet for Healthcare Providers:  IncredibleEmployment.be  This test is no t yet approved or cleared by the [place] FDA and  has been authorized for detection and/or diagnosis of SARS-CoV-2 by FDA under an Emergency Use Authorization (EUA). This EUA will remain  in effect (meaning this test can be used) for the duration of the COVID-19 declaration under Section 564(b)(1) of the Act, 21 U.S.C.section 360bbb-3(b)(1), unless the authorization is terminated  or revoked sooner.       Influenza A by PCR NEGATIVE NEGATIVE Final   Influenza B by PCR POSITIVE (A) NEGATIVE Final    Comment: (NOTE) The Xpert Xpress SARS-CoV-2/FLU/RSV plus assay is intended as an aid in the diagnosis of influenza from Nasopharyngeal swab specimens and should not be used as a sole basis for treatment. Nasal washings and aspirates are unacceptable for Xpert Xpress  SARS-CoV-2/FLU/RSV testing.  Fact Sheet for Patients: EntrepreneurPulse.com.au  Fact Sheet for Healthcare Providers: IncredibleEmployment.be  This test is not yet approved or cleared by the [place] FDA and has been authorized for detection and/or diagnosis of SARS-CoV-2 by FDA under an Emergency Use Authorization (EUA). This EUA will remain in effect (meaning this test can be used) for the duration of the COVID-19 declaration under Section 564(b)(1) of the Act, 21 U.S.C. section 360bbb-3(b)(1), unless the authorization is terminated or revoked.     Resp Syncytial Virus by PCR NEGATIVE NEGATIVE Final    Comment: (NOTE) Fact Sheet for Patients: EntrepreneurPulse.com.au  Fact Sheet for Healthcare Providers: IncredibleEmployment.be  This test is not yet approved or cleared by the [place] FDA and has been authorized for detection and/or diagnosis of SARS-CoV-2 by FDA under an Emergency Use Authorization (EUA). This EUA will remain in effect (meaning this test can be used) for the duration of the COVID-19 declaration under Section 564(b)(1) of the Act, 21 U.S.C. section 360bbb-3(b)(1), unless the authorization is terminated or revoked.  Performed at [facility], [address] [place], [place] [address]   Resp panel by RT-PCR (RSV, Flu A&B, Covid) Anterior Nasal Swab     Status: None   Collection Time: 02/28/22 12:14 PM   Specimen: Anterior Nasal Swab  Result Value Ref Range Status   SARS Coronavirus 2 by RT PCR NEGATIVE NEGATIVE Final    Comment: (NOTE) SARS-CoV-2 target nucleic acids are NOT DETECTED.  The SARS-CoV-2 RNA is generally detectable in upper respiratory specimens during the acute phase of infection. The lowest concentration of SARS-CoV-2 viral copies this assay can detect is 138 copies/mL. A negative result does not preclude SARS-Cov-2 infection and should not be used  as the sole basis for treatment or other patient management decisions. A negative result may occur with  improper specimen collection/handling, submission of specimen other than nasopharyngeal swab, presence of viral mutation(s) within the areas targeted by this assay, and inadequate number of viral copies(<138 copies/mL). A negative result must be combined with clinical observations, patient history, and epidemiological information. The expected result is Negative.  Fact Sheet for Patients:  EntrepreneurPulse.com.au  Fact Sheet for Healthcare Providers:  IncredibleEmployment.be  This test is no t yet approved or cleared by the [place] FDA and  has been authorized for detection and/or diagnosis of SARS-CoV-2 by FDA under an Emergency Use Authorization (EUA). This EUA will remain  in effect (meaning this test can be used) for the duration of the COVID-19 declaration under Section 564(b)(1)  of the Act, 21 U.S.C.section 360bbb-3(b)(1), unless the authorization is terminated  or revoked sooner.       Influenza A by PCR NEGATIVE NEGATIVE Final   Influenza B by PCR NEGATIVE NEGATIVE Final    Comment: (NOTE) The Xpert Xpress SARS-CoV-2/FLU/RSV plus assay is intended as an aid in the diagnosis of influenza from Nasopharyngeal swab specimens and should not be used as a sole basis for treatment. Nasal washings and aspirates are unacceptable for Xpert Xpress SARS-CoV-2/FLU/RSV testing.  Fact Sheet for Patients: EntrepreneurPulse.com.au  Fact Sheet for Healthcare Providers: IncredibleEmployment.be  This test is not yet approved or cleared by the [place] FDA and has been authorized for detection and/or diagnosis of SARS-CoV-2 by FDA under an Emergency Use Authorization (EUA). This EUA will remain in effect (meaning this test can be used) for the duration of the COVID-19 declaration under Section 564(b)(1)  of the Act, 21 U.S.C. section 360bbb-3(b)(1), unless the authorization is terminated or revoked.     Resp Syncytial Virus by PCR NEGATIVE NEGATIVE Final    Comment: (NOTE) Fact Sheet for Patients: EntrepreneurPulse.com.au  Fact Sheet for Healthcare Providers: IncredibleEmployment.be  This test is not yet approved or cleared by the [place] FDA and has been authorized for detection and/or diagnosis of SARS-CoV-2 by FDA under an Emergency Use Authorization (EUA). This EUA will remain in effect (meaning this test can be used) for the duration of the COVID-19 declaration under Section 564(b)(1) of the Act, 21 U.S.C. section 360bbb-3(b)(1), unless the authorization is terminated or revoked.  Performed at [facility], [address] [place], [place] [address]   Culture, blood (routine x 2) Call MD if unable to obtain prior to antibiotics being given     Status: None (Preliminary result)   Collection Time: 02/28/22  4:40 PM   Specimen: BLOOD  Result Value Ref Range Status   Specimen Description BLOOD BLOOD LEFT ARM  Final   Special Requests   Final    BOTTLES DRAWN AEROBIC AND ANAEROBIC Blood Culture results may not be optimal due to an inadequate volume of blood received in culture bottles   Culture   Final    NO GROWTH < 24 HOURS Performed at [facility], [address], [place], [place] [address]    Report Status PENDING  Incomplete  Culture, blood (routine x 2) Call MD if unable to obtain prior to antibiotics being given     Status: None (Preliminary result)   Collection Time: 02/28/22  4:41 PM   Specimen: BLOOD  Result Value Ref Range Status   Specimen Description BLOOD BLOOD LEFT HAND  Final   Special Requests   Final    BOTTLES DRAWN AEROBIC AND ANAEROBIC Blood Culture adequate volume   Culture   Final    NO GROWTH < 24 HOURS Performed at [facility], [address], [place],  [address]     Report Status PENDING  Incomplete    Radiology Studies: CT Head Wo Contrast  Result Date: 02/28/2022 CLINICAL DATA:  Altered mental status.  Hypoxia.  Recent pneumonia. EXAM: CT HEAD WITHOUT CONTRAST TECHNIQUE: Contiguous axial images were obtained from the base of the skull through the vertex without intravenous contrast. RADIATION DOSE REDUCTION: This exam was performed according to the departmental dose-optimization program which includes automated exposure control, adjustment of the mA and/or kV according to patient size and/or use of iterative reconstruction technique. COMPARISON:  09/24/2021 FINDINGS: Brain: Ventricles are slightly asymmetric left greater than right unchanged. Remaining  cisterns and other CSF spaces are within normal. There is mild chronic ischemic microvascular disease present. There is no mass, mass effect, shift of midline structures or acute hemorrhage. No evidence of acute infarction. Vascular: No hyperdense vessel or unexpected calcification. Skull: Normal. Negative for fracture or focal lesion. Sinuses/Orbits: Orbits are normal symmetric. There is mild chronic inflammatory change of the paranasal sinuses involving the ethmoid, sphenoid, left maxillary sinuses and right frontal ethmoidal recess. Chronic opacification over the mastoid air cells bilaterally. Other: None. IMPRESSION: 1. No acute findings. 2. Mild chronic ischemic microvascular disease. 3. Mild chronic inflammatory change of the paranasal sinuses and mastoid air cells. Electronically Signed   By: Marin Olp M.D.   On: 02/28/2022 13:56   DG Chest Port 1 View  Result Date: 02/28/2022 CLINICAL DATA:  Shortness of breath. EXAM: PORTABLE CHEST 1 VIEW COMPARISON:  February 19, 2022 FINDINGS: Mediastinal contour is normal. The heart size is enlarged. Patchy consolidation of the left mid lung and left lung base are identified. Probable left pleural effusion. Minimal atelectasis of right lung base noted. Biapical pleural  thickening noted. The visualized skeletal structures are stable. IMPRESSION: Patchy consolidation of the left mid lung and left lung base, pneumonia is not excluded. Electronically Signed   By: Abelardo Diesel M.D.   On: 02/28/2022 12:55      Jakeria Caissie T. Steelton  If 7PM-7AM, please contact night-coverage [email or web address] 03/01/2022, 11:16 AM

## 2022-03-01 NOTE — ED Notes (Signed)
PT at bedside.

## 2022-03-02 DIAGNOSIS — J690 Pneumonitis due to inhalation of food and vomit: Secondary | ICD-10-CM | POA: Diagnosis not present

## 2022-03-02 DIAGNOSIS — Z7189 Other specified counseling: Secondary | ICD-10-CM

## 2022-03-02 DIAGNOSIS — I5032 Chronic diastolic (congestive) heart failure: Secondary | ICD-10-CM | POA: Diagnosis not present

## 2022-03-02 DIAGNOSIS — D500 Iron deficiency anemia secondary to blood loss (chronic): Secondary | ICD-10-CM | POA: Diagnosis not present

## 2022-03-02 DIAGNOSIS — G9341 Metabolic encephalopathy: Secondary | ICD-10-CM | POA: Diagnosis not present

## 2022-03-02 LAB — BPAM RBC
Blood Product Expiration Date: 202401262359
ISSUE DATE / TIME: 202401161326
Unit Type and Rh: 7300

## 2022-03-02 LAB — CBC
HCT: 24.9 % — ABNORMAL LOW (ref 36.0–46.0)
Hemoglobin: 8.2 g/dL — ABNORMAL LOW (ref 12.0–15.0)
MCH: 29.3 pg (ref 26.0–34.0)
MCHC: 32.9 g/dL (ref 30.0–36.0)
MCV: 88.9 fL (ref 80.0–100.0)
Platelets: 461 10*3/uL — ABNORMAL HIGH (ref 150–400)
RBC: 2.8 MIL/uL — ABNORMAL LOW (ref 3.87–5.11)
RDW: 15.5 % (ref 11.5–15.5)
WBC: 10 10*3/uL (ref 4.0–10.5)
nRBC: 0 % (ref 0.0–0.2)

## 2022-03-02 LAB — RENAL FUNCTION PANEL
Albumin: 2.3 g/dL — ABNORMAL LOW (ref 3.5–5.0)
Anion gap: 10 (ref 5–15)
BUN: 24 mg/dL — ABNORMAL HIGH (ref 8–23)
CO2: 21 mmol/L — ABNORMAL LOW (ref 22–32)
Calcium: 8.4 mg/dL — ABNORMAL LOW (ref 8.9–10.3)
Chloride: 99 mmol/L (ref 98–111)
Creatinine, Ser: 1.22 mg/dL — ABNORMAL HIGH (ref 0.44–1.00)
GFR, Estimated: 42 mL/min — ABNORMAL LOW (ref >60)
Glucose, Bld: 108 mg/dL — ABNORMAL HIGH (ref 70–99)
Phosphorus: 3.8 mg/dL (ref 2.5–4.6)
Potassium: 3.4 mmol/L — ABNORMAL LOW (ref 3.5–5.1)
Sodium: 130 mmol/L — ABNORMAL LOW (ref 135–145)

## 2022-03-02 LAB — TYPE AND SCREEN
ABO/RH(D): B POS
Antibody Screen: NEGATIVE
Unit division: 0

## 2022-03-02 LAB — CBG MONITORING, ED: Glucose-Capillary: 112 mg/dL — ABNORMAL HIGH (ref 70–99)

## 2022-03-02 LAB — MAGNESIUM: Magnesium: 1.9 mg/dL (ref 1.7–2.4)

## 2022-03-02 LAB — CK: Total CK: 144 U/L (ref 38–234)

## 2022-03-02 MED ORDER — FUROSEMIDE 20 MG PO TABS
20.0000 mg | ORAL_TABLET | Freq: Every day | ORAL | Status: DC
  Administered 2022-03-02 – 2022-03-12 (×11): 20 mg via ORAL
  Filled 2022-03-02 (×11): qty 1

## 2022-03-02 MED ORDER — IPRATROPIUM-ALBUTEROL 0.5-2.5 (3) MG/3ML IN SOLN
3.0000 mL | Freq: Two times a day (BID) | RESPIRATORY_TRACT | Status: DC
  Administered 2022-03-03 – 2022-03-07 (×7): 3 mL via RESPIRATORY_TRACT
  Filled 2022-03-02 (×9): qty 3

## 2022-03-02 MED ORDER — POTASSIUM CHLORIDE CRYS ER 20 MEQ PO TBCR
40.0000 meq | EXTENDED_RELEASE_TABLET | Freq: Once | ORAL | Status: AC
  Administered 2022-03-02: 40 meq via ORAL
  Filled 2022-03-02: qty 2

## 2022-03-02 NOTE — NC FL2 (Signed)
[place] LEVEL OF CARE FORM     IDENTIFICATION  Patient Name: [name] Birthdate: [birth year] Sex: female Admission Date (Current Location): 02/28/2022  [facility] and [place] Number:  Engineering geologist and Address:  [facility], [address] [place], [place] [address]      Provider Number: [id number]  Attending Physician Name and Address:  Ivor Costa, MD  Relative Name and Phone Number:  [name] - daughter- [phone]    Current Level of Care: Hospital Recommended Level of Care: [place] Prior Approval Number:    Date Approved/Denied:   PASRR Number: [id number]  Discharge Plan: SNF    Current Diagnoses: Patient Active Problem List   Diagnosis Date Noted   DNR (do not resuscitate) 03/01/2022   Chronic diastolic CHF (congestive heart failure) ([place]) 02/28/2022   Normocytic anemia 02/28/2022   Acute renal failure superimposed on stage 3a chronic kidney disease ([place]) 02/16/7251   Acute metabolic encephalopathy 66/44/0347   Aspiration pneumonia ([place]) 02/28/2022   (HFpEF) heart failure with preserved ejection fraction ([place]) 02/19/2022   SOB (shortness of breath) 12/14/2021   Pulmonary embolism ([place]) 12/14/2021   Shortness of breath 12/13/2021   Pneumonia due to COVID-19 virus 12/13/2021   Hyperkalemia 12/13/2021   Hyponatremia 12/13/2021   At risk for polypharmacy 12/13/2021   Acute on chronic diastolic heart failure ([place]) 12/09/2021   Paroxysmal atrial fibrillation ([place]) 11/16/2021   Blood loss anemia 11/16/2021   Acute respiratory failure with hypoxia ([place]) 11/16/2021   Elevated temperature 11/16/2021   Aortic atherosclerosis ([place]) 08/02/2020   Fall 12/29/2019   Weakness 06/09/2019   Wheezing 04/16/2019   Back skin lesion 12/04/2018   Restless legs syndrome 11/25/2018   PAD (peripheral artery disease) ([place]) 04/07/2018   Cold foot 05/15/2017   Constipation 08/15/2016   AKI (acute kidney injury) ([place])  04/13/2016   Small bowel obstruction ([place]) 11/24/2015   Swelling of left lower extremity 07/16/2015   Acute Ileitis 06/12/2015   Hypokalemia 06/12/2015   Accelerated hypertension 06/12/2015   Hereditary hemochromatosis ([place]) 05/25/2015   Open leg wound 05/19/2015   Lower extremity edema 05/19/2015   Iron excess 04/16/2015   Dizziness 10/26/2014   Hyperbilirubinemia 10/26/2014   Health care maintenance 05/18/2014   Anemia, iron deficiency 04/21/2014   Hospital discharge follow-up 04/14/2014   Hot flashes 03/07/2014   Face lesion 11/03/2013   Numbness in feet 10/08/2012   Nocturia 10/08/2012   Essential hypertension 12/18/2011   Hypercholesteremia 12/18/2011   History of colon cancer 12/18/2011   Barrett's esophagus 12/18/2011   Chronic back pain 12/18/2011   Osteopenia 12/18/2011    Orientation RESPIRATION BLADDER Height & Weight     Self  Normal Incontinent Weight: 72.3 kg Height:  '5\' 2"'$  (157.5 cm)  BEHAVIORAL SYMPTOMS/MOOD NEUROLOGICAL BOWEL NUTRITION STATUS      Incontinent Diet (see discharge summary)  AMBULATORY STATUS COMMUNICATION OF NEEDS Skin   Extensive Assist Verbally Bruising                       Personal Care Assistance Level of Assistance  Bathing, Feeding, Dressing Bathing Assistance: Maximum assistance Feeding assistance: Limited assistance Dressing Assistance: Maximum assistance     Functional Limitations Info  Sight, Hearing, Speech Sight Info: Adequate Hearing Info: Adequate Speech Info: Adequate    SPECIAL CARE FACTORS FREQUENCY  PT (By licensed PT), OT (By licensed OT), Speech therapy     PT Frequency: 5 times per week OT Frequency:  5 times per week     Speech Therapy Frequency: eval at facility      Contractures Contractures Info: Not present    Additional Factors Info  Code Status, Allergies Code Status Info: DNR Allergies Info: Fentanyl           Current Medications (03/02/2022):  This is the current hospital active  medication list Current Facility-Administered Medications  Medication Dose Route Frequency Provider Last Rate Last Admin   acetaminophen (TYLENOL) suppository 650 mg  650 mg Rectal Q6H PRN Ivor Costa, MD       acetaminophen (TYLENOL) tablet 650 mg  650 mg Oral Q6H PRN Ivor Costa, MD       albuterol (PROVENTIL) (2.5 MG/3ML) 0.083% nebulizer solution 2.5 mg  2.5 mg Nebulization Q4H PRN Ivor Costa, MD       Ampicillin-Sulbactam (UNASYN) 3 g in sodium chloride 0.9 % 100 mL IVPB  3 g Intravenous Q12H Alison Murray, RPH   Stopped at 03/02/22 0536   apixaban (ELIQUIS) tablet 5 mg  5 mg Oral BID Ivor Costa, MD   5 mg at 03/02/22 1036   calcium carbonate (OS-CAL - dosed in mg of elemental calcium) tablet 1,250 mg  500 mg of elemental calcium Oral BID WC Ivor Costa, MD   1,250 mg at 03/02/22 0109   cholecalciferol (VITAMIN D3) 25 MCG (1000 UNIT) tablet 2,000 Units  2,000 Units Oral Daily Ivor Costa, MD   2,000 Units at 03/02/22 1036   dextromethorphan-guaiFENesin ([place] DM) 30-600 MG per 12 hr tablet 1 tablet  1 tablet Oral BID PRN Ivor Costa, MD       diclofenac Sodium (VOLTAREN) 1 % topical gel 2 g  2 g Topical TID Ivor Costa, MD   2 g at 03/01/22 2147   docusate sodium (COLACE) capsule 100 mg  100 mg Oral BID Ivor Costa, MD   100 mg at 03/02/22 1036   gabapentin (NEURONTIN) capsule 200 mg  200 mg Oral QHS Ivor Costa, MD   200 mg at 03/01/22 2146   hydrALAZINE (APRESOLINE) injection 5 mg  5 mg Intravenous Q2H PRN Ivor Costa, MD       ipratropium-albuterol (DUONEB) 0.5-2.5 (3) MG/3ML nebulizer solution 3 mL  3 mL Nebulization Q4H Ivor Costa, MD   3 mL at 03/02/22 1126   metoprolol succinate (TOPROL-XL) 24 hr tablet 25 mg  25 mg Oral Daily Ivor Costa, MD   25 mg at 03/01/22 1035   ondansetron (ZOFRAN) injection 4 mg  4 mg Intravenous Q8H PRN Ivor Costa, MD       pantoprazole (PROTONIX) EC tablet 40 mg  40 mg Oral BID Ivor Costa, MD   40 mg at 03/02/22 1036   simvastatin (ZOCOR) tablet 10 mg  10 mg Oral  QHS Ivor Costa, MD   10 mg at 03/01/22 2146     Discharge Medications: Please see discharge summary for a list of discharge medications.  Relevant Imaging Results:  Relevant Lab Results:   Additional Information SS# [id number]  Shelbie Hutching, RN

## 2022-03-02 NOTE — Progress Notes (Signed)
Progress Note    [name]  YIF:[id number] DOB: 1928/02/16  DOA: 02/28/2022 PCP: [name], MD      Brief Narrative:    Medical records reviewed and are as summarized below:  [name] is a 89 y.o. female  with PMH of chronic diastolic CHF, A-fib and PE on Eliquis, HTN, HLD, hereditary hemochromatosis, colon cancer s/p surgical resection and chemo, GERD, ambulatory dysfunction, incontinence, recent hospitalization from 1/6 through 02/23/2022 for influenza B infection and bacterial pneumonia.  She presented to the hospital with altered mental status, productive cough and shortness of breath.  Reportedly, oxygen saturation was in the 70s on room air when EMS arrived.  She was placed on 100% oxygen via nonrebreather mask and brought to the emergency department for further management.   She was admitted to the hospital for aspiration pneumonia, acute metabolic encephalopathy, AKI.     Assessment/Plan:   Principal Problem:   Aspiration pneumonia ([place]) Active Problems:   Blood loss anemia   Acute metabolic encephalopathy   Paroxysmal atrial fibrillation (HCC)   Pulmonary embolism (HCC)   Essential hypertension   Chronic diastolic CHF (congestive heart failure) (HCC)   Hypercholesteremia   Restless legs syndrome   Normocytic anemia   Acute renal failure superimposed on stage 3a chronic kidney disease ([place])   DNR (do not resuscitate)    Body mass index is 29.15 kg/m.  Aspiration pneumonia: COVID-19, influenza and RSV tests were negative.  No growth on blood cultures thus far.  Continue IV Unasyn.  Aspiration precautions.  Acute metabolic encephalopathy: Mental status is improving.  Continue supportive care.  CT head did not show any acute abnormality.  AKI on CKD stage IIIa: Creatinine is improving.  Monitor creatinine off of IV fluids.  Acute hypoxic respiratory failure: Improved.  She is tolerating room air.  Paroxysmal atrial fibrillation: Continue  metoprolol and Eliquis  Acute blood loss anemia: H&H improved.  S/p transfusion with 1 unit of PRBCs on 03/01/2022.  Monitor CBC.  Probable dysphagia, regurgitation, history of Barrett's esophagus: Aspiration precautions.  Continue Protonix.  Chronic diastolic CHF, peripheral edema: Restart Lasix  Other comorbidities include history of pulmonary embolism on Eliquis, hypercholesterolemia    Diet Order             DIET DYS 3 Room service appropriate? Yes with Assist; Fluid consistency: Thin  Diet effective now                            Consultants: None  Procedures: None    Medications:    apixaban  5 mg Oral BID   calcium carbonate  500 mg of elemental calcium Oral BID WC   cholecalciferol  2,000 Units Oral Daily   diclofenac Sodium  2 g Topical TID   docusate sodium  100 mg Oral BID   furosemide  20 mg Oral Daily   gabapentin  200 mg Oral QHS   [START ON 03/03/2022] ipratropium-albuterol  3 mL Nebulization BID   metoprolol succinate  25 mg Oral Daily   pantoprazole  40 mg Oral BID   simvastatin  10 mg Oral QHS   Continuous Infusions:  ampicillin-sulbactam (UNASYN) IV Stopped (03/02/22 0536)     Anti-infectives (From admission, onward)    Start     Dose/Rate Route Frequency Ordered Stop   02/28/22 1600  Ampicillin-Sulbactam (UNASYN) 3 g in sodium chloride 0.9 % 100 mL IVPB  3 g 200 mL/hr over 30 Minutes Intravenous Every 12 hours 02/28/22 1552                Family Communication/Anticipated D/C date and plan/Code Status   DVT prophylaxis:  apixaban (ELIQUIS) tablet 5 mg     Code Status: DNR  Family Communication: Plan discussed with Zigmund Daniel, daughter, at the bedside Disposition Plan: Plan to discharge   Status is: Inpatient Remains inpatient appropriate because: IV antibiotics       Subjective:   Interval events noted.  No shortness of breath or chest pain.  She feels better today.  Zigmund Daniel, daughter, was at the  bedside  Objective:    Vitals:   03/02/22 1155 03/02/22 1300 03/02/22 1345 03/02/22 1523  BP:  (!) 121/54 130/63   Pulse: (!) 101 100 (!) 102   Resp: '18 18 18   '$ Temp:  98 F (36.7 C) 99.3 F (37.4 C)   TempSrc:   Oral   SpO2: 99% 98% 98% 98%  Weight:      Height:       No data found.   Intake/Output Summary (Last 24 hours) at 03/02/2022 1538 Last data filed at 03/02/2022 0600 Gross per 24 hour  Intake 300 ml  Output 200 ml  Net 100 ml   Filed Weights   02/28/22 1137  Weight: 72.3 kg    Exam:  GEN: NAD SKIN: Bruises on  upper and lower extremities EYES: No pallor or icterus ENT: MMM CV: Irregular rate and rhythm, tachycardic PULM: Bibasilar rales, no wheezing ABD: soft, ND, NT, +BS CNS: AAO x 3, non focal EXT: B/l lower extremity edema (thighs to feet), right arm swelling > left, no tenderness    Pressure Injury 02/19/22 Buttocks redness and breakdown on the buttocks (Active)  02/19/22 2200  Location: Buttocks  Location Orientation:   Staging:   Wound Description (Comments): redness and breakdown on the buttocks  Present on Admission: Yes  Dressing Type Foam - Lift dressing to assess site every shift 03/02/22 1530     Data Reviewed:   I have personally reviewed following labs and imaging studies:  Labs: Labs show the following:   Basic Metabolic Panel: Recent Labs  Lab 02/28/22 1142 03/01/22 0411 03/02/22 0450  NA 131* 133* 130*  K 3.5 3.4* 3.4*  CL 97* 101 99  CO2 22 23 21*  GLUCOSE 98 110* 108*  BUN 28* 25* 24*  CREATININE 2.41* 1.87* 1.22*  CALCIUM 8.2* 7.8* 8.4*  MG  --   --  1.9  PHOS  --   --  3.8   GFR Estimated Creatinine Clearance: 28 mL/min (A) (by C-G formula based on SCr of 1.22 mg/dL (H)). Liver Function Tests: Recent Labs  Lab 03/02/22 0450  ALBUMIN 2.3*   No results for input(s): "LIPASE", "AMYLASE" in the last 168 hours. No results for input(s): "AMMONIA" in the last 168 hours. Coagulation profile No results for  input(s): "INR", "PROTIME" in the last 168 hours.  CBC: Recent Labs  Lab 02/28/22 1142 03/01/22 0411 03/02/22 0450  WBC 10.8* 12.8* 10.0  NEUTROABS 7.8*  --   --   HGB 7.8* 7.5* 8.2*  HCT 24.9* 23.6* 24.9*  MCV 91.9 91.1 88.9  PLT 507* 431* 461*   Cardiac Enzymes: Recent Labs  Lab 03/02/22 0450  CKTOTAL 144   BNP (last 3 results) No results for input(s): "PROBNP" in the last 8760 hours. CBG: Recent Labs  Lab 02/28/22 1142 03/01/22 7106 03/02/22 2694  GLUCAP 92 104* 112*   D-Dimer: No results for input(s): "DDIMER" in the last 72 hours. Hgb A1c: No results for input(s): "HGBA1C" in the last 72 hours. Lipid Profile: No results for input(s): "CHOL", "HDL", "LDLCALC", "TRIG", "CHOLHDL", "LDLDIRECT" in the last 72 hours. Thyroid function studies: No results for input(s): "TSH", "T4TOTAL", "T3FREE", "THYROIDAB" in the last 72 hours.  Invalid input(s): "FREET3" Anemia work up: No results for input(s): "VITAMINB12", "FOLATE", "FERRITIN", "TIBC", "IRON", "RETICCTPCT" in the last 72 hours. Sepsis Labs: Recent Labs  Lab 02/28/22 1142 02/28/22 1214 02/28/22 1215 03/01/22 0411 03/02/22 0450  PROCALCITON  --  <0.10  --   --   --   WBC 10.8*  --   --  12.8* 10.0  LATICACIDVEN  --   --  0.9  --   --     Microbiology Recent Results (from the past 240 hour(s))  Resp panel by RT-PCR (RSV, Flu A&B, Covid) Anterior Nasal Swab     Status: None   Collection Time: 02/28/22 12:14 PM   Specimen: Anterior Nasal Swab  Result Value Ref Range Status   SARS Coronavirus 2 by RT PCR NEGATIVE NEGATIVE Final    Comment: (NOTE) SARS-CoV-2 target nucleic acids are NOT DETECTED.  The SARS-CoV-2 RNA is generally detectable in upper respiratory specimens during the acute phase of infection. The lowest concentration of SARS-CoV-2 viral copies this assay can detect is 138 copies/mL. A negative result does not preclude SARS-Cov-2 infection and should not be used as the sole basis for  treatment or other patient management decisions. A negative result may occur with  improper specimen collection/handling, submission of specimen other than nasopharyngeal swab, presence of viral mutation(s) within the areas targeted by this assay, and inadequate number of viral copies(<138 copies/mL). A negative result must be combined with clinical observations, patient history, and epidemiological information. The expected result is Negative.  Fact Sheet for Patients:  EntrepreneurPulse.com.au  Fact Sheet for Healthcare Providers:  IncredibleEmployment.be  This test is no t yet approved or cleared by the [place] FDA and  has been authorized for detection and/or diagnosis of SARS-CoV-2 by FDA under an Emergency Use Authorization (EUA). This EUA will remain  in effect (meaning this test can be used) for the duration of the COVID-19 declaration under Section 564(b)(1) of the Act, 21 U.S.C.section 360bbb-3(b)(1), unless the authorization is terminated  or revoked sooner.       Influenza A by PCR NEGATIVE NEGATIVE Final   Influenza B by PCR NEGATIVE NEGATIVE Final    Comment: (NOTE) The Xpert Xpress SARS-CoV-2/FLU/RSV plus assay is intended as an aid in the diagnosis of influenza from Nasopharyngeal swab specimens and should not be used as a sole basis for treatment. Nasal washings and aspirates are unacceptable for Xpert Xpress SARS-CoV-2/FLU/RSV testing.  Fact Sheet for Patients: EntrepreneurPulse.com.au  Fact Sheet for Healthcare Providers: IncredibleEmployment.be  This test is not yet approved or cleared by the [place] FDA and has been authorized for detection and/or diagnosis of SARS-CoV-2 by FDA under an Emergency Use Authorization (EUA). This EUA will remain in effect (meaning this test can be used) for the duration of the COVID-19 declaration under Section 564(b)(1) of the Act, 21  U.S.C. section 360bbb-3(b)(1), unless the authorization is terminated or revoked.     Resp Syncytial Virus by PCR NEGATIVE NEGATIVE Final    Comment: (NOTE) Fact Sheet for Patients: EntrepreneurPulse.com.au  Fact Sheet for Healthcare Providers: IncredibleEmployment.be  This test is not yet approved or cleared by the  [place] Architectural technologist and has been authorized for detection and/or diagnosis of SARS-CoV-2 by FDA under an Print production planner (EUA). This EUA will remain in effect (meaning this test can be used) for the duration of the COVID-19 declaration under Section 564(b)(1) of the Act, 21 U.S.C. section 360bbb-3(b)(1), unless the authorization is terminated or revoked.  Performed at [facility], [address] [place], [place] [address]   Urine Culture     Status: None   Collection Time: 02/28/22 12:14 PM   Specimen: Urine, Suprapubic  Result Value Ref Range Status   Specimen Description   Final    URINE, SUPRAPUBIC Performed at [facility], [address], [place], [place] [address]    Special Requests   Final    NONE Performed at [facility], [address] [place], [place] [address]    Culture   Final    NO GROWTH Performed at [facility], [place] [address], [place], [place] [address]    Report Status 03/01/2022 FINAL  Final  Culture, blood (routine x 2) Call MD if unable to obtain prior to antibiotics being given     Status: None (Preliminary result)   Collection Time: 02/28/22  4:40 PM   Specimen: BLOOD  Result Value Ref Range Status   Specimen Description BLOOD BLOOD LEFT ARM  Final   Special Requests   Final    BOTTLES DRAWN AEROBIC AND ANAEROBIC Blood Culture results may not be optimal due to an inadequate volume of blood received in culture bottles   Culture   Final    NO GROWTH 2 DAYS Performed at [facility], [address] [place], [place] [address]    Report  Status PENDING  Incomplete  Culture, blood (routine x 2) Call MD if unable to obtain prior to antibiotics being given     Status: None (Preliminary result)   Collection Time: 02/28/22  4:41 PM   Specimen: BLOOD  Result Value Ref Range Status   Specimen Description BLOOD BLOOD LEFT HAND  Final   Special Requests   Final    BOTTLES DRAWN AEROBIC AND ANAEROBIC Blood Culture adequate volume   Culture   Final    NO GROWTH 2 DAYS Performed at [facility], [address] [place], [place] [address]    Report Status PENDING  Incomplete    Procedures and diagnostic studies:  No results found.             LOS: 2 days   Ahlana Slaydon  [facility]   Pager on [email or web address]. If 7PM-7AM, please contact night-coverage at [email or web address]     03/02/2022, 3:38 PM

## 2022-03-02 NOTE — Consult Note (Signed)
Consultation Note Date: 03/02/2022   Patient Name: ELENER CUSTODIO  DOB: [birth year]  MRN: [id number]  Age / Sex: 89 y.o., female  PCP: [name], MD Referring Physician: Jennye Boroughs, MD  Reason for Consultation: Establishing goals of care  HPI/Patient Profile: 89 year old F with PMH of diastolic CHF, A-fib and PE on Eliquis, HTN, HLD, hereditary hemochromatosis, colon cancer s/p surgical resection and chemo, GERD, ambulatory dysfunction and incontinence presenting with altered mental status, shortness of breath and productive cough with whitish phlegm and admitted for aspiration pneumonia, acute metabolic encephalopathy and AKI.  Recently hospitalized from 1/6-1/10 for influenza B and bacterial pneumonia and discharged on p.o. Levaquin.   Clinical Assessment and Goals of Care: Notes and labs reviewed. In to see patient.  PT is at bedside working with patient, and patient is cooperative.  Daughter is at bedside who handed me her phone to speak with daughter [name] who she states is HPOA.   Stepped out to speak to [name] via phone. She discusses her mother's status and decline since October.  We discussed her hospitalizations as well.  She discusses that at baseline her mother and father live together as they have been married for 22 years.  She states they have had to hire someone to come and and help the 2 of them.  She discusses that since leaving [facility] for rehab, her mother has required assistance with movement and activity, in addition to ADLs.  Daughter then questions if patient is a palliative or hospice candidate.  We discussed her diagnoses, GOC, wishes disposition and options.  Created space and opportunity for patient  to explore thoughts and feelings regarding current medical information.   A detailed discussion was had today regarding advanced directives.  Concepts specific to code status,  artifical feeding and hydration, IV antibiotics and rehospitalization were discussed.  The difference between an aggressive medical intervention path and a comfort care path was discussed.  Values and goals of care important to patient and family were attempted to be elicited.  Discussed limitations of medical interventions to prolong quality of life in some situations and discussed the concept of human mortality.  Daughter states her mother has always been a woman of great faith, and has stated that when the time comes she is ready to go.  She states over the past few months her mother has become increasingly afraid of the prospect of death.  She states she has had her pastor to speak with her mother.  She states that she believes her mother may be afraid to leave family, and she states she will speak with them about reassuring her about transition  She states she hates to see her mother suffer.  With conversation, [name] tells me that they he would like to pursue skilled nursing but would like to consider hospice level care if things do not go well with skilled nursing.  Confirmed DNR/DNI status.  Upon reentering the room patient was sleeping.    SUMMARY OF RECOMMENDATIONS   Recommend outpatient palliative to  follow with transition to hospice level care when patient and family are ready.      Primary Diagnoses: Present on Admission:  Normocytic anemia  Pulmonary embolism (HCC)  Paroxysmal atrial fibrillation (HCC)  Essential hypertension  Hypercholesteremia  Acute renal failure superimposed on stage 3a chronic kidney disease (HCC)  (Resolved) UTI (urinary tract infection)  Chronic diastolic CHF (congestive heart failure) (HCC)  Acute metabolic encephalopathy  Aspiration pneumonia (HCC)  Restless legs syndrome  Blood loss anemia   I have reviewed the medical record, interviewed the patient and family, and examined the patient. The following aspects are pertinent.  Past Medical  History:  Diagnosis Date   Anemia    Barrett's esophagus    BCC (basal cell carcinoma of skin) 03/07/2013   vertex scalp - NODULAR NODULAR PATTERN PATTERN   Bowel obstruction (HCC)    s/p adhesion resection   CHF (congestive heart failure) (HCC)    Chronic back pain    Colon cancer ([place]) 1988 and 1989   adenocarcinoma, s/p resection x  2 and chemo   Diverticulitis    GERD (gastroesophageal reflux disease)    H/O open leg wound    Hiatal hernia    Hypercholesteremia    Hypertension    Lumbar scoliosis    OA (osteoarthritis)    Peripheral neuropathy    Recurrent sinus infections    Renal cyst    S/P chemotherapy, time since greater than 12 weeks    colon cancer   Vertigo    Social History   Socioeconomic History   Marital status: Married    Spouse name: Not on file   Number of children: 4   Years of education: 12th grade   Highest education level: Not on file  Occupational History   Occupation: homemaker  Tobacco Use   Smoking status: Never   Smokeless tobacco: Never  Vaping Use   Vaping Use: Never used  Substance and Sexual Activity   Alcohol use: No    Alcohol/week: 0.0 standard drinks of alcohol   Drug use: No   Sexual activity: Not Currently  Other Topics Concern   Not on file  Social History Narrative   Not on file   Social Determinants of Health   Financial Resource Strain: Low Risk  (11/30/2020)   Overall Financial Resource Strain (CARDIA)    Difficulty of Paying Living Expenses: Not hard at all  Food Insecurity: No Food Insecurity (03/01/2022)   Hunger Vital Sign    Worried About Running Out of Food in the Last Year: Never true    Ran Out of Food in the Last Year: Never true  Transportation Needs: No Transportation Needs (03/01/2022)   PRAPARE - Hydrologist (Medical): No    Lack of Transportation (Non-Medical): No  Physical Activity: Unknown (11/30/2020)   Exercise Vital Sign    Days of Exercise per Week: 0 days     Minutes of Exercise per Session: Not on file  Stress: No Stress Concern Present (11/30/2020)   [place]    Feeling of Stress : Not at all  Social Connections: Unknown (11/30/2020)   Social Connection and Isolation Panel [NHANES]    Frequency of Communication with Friends and Family: More than three times a week    Frequency of Social Gatherings with Friends and Family: More than three times a week    Attends Religious Services: Not on file    Active Member of  Clubs or Organizations: Not on file    Attends Archivist Meetings: Not on file    Marital Status: Married   Family History  Problem Relation Age of Onset   Breast cancer Mother    Asthma Mother    Stroke Father    Hypertension Father    Diabetes Father    Ovarian cancer Sister        x2   Prostate cancer Brother    Hypertension Brother        x3   Heart disease Brother    Hypercholesterolemia Brother        x3   Diabetes Brother    Spina bifida Grandchild    Hematuria Neg Hx    Kidney cancer Neg Hx    Kidney disease Neg Hx    Sickle cell trait Neg Hx    Tuberculosis Neg Hx    Scheduled Meds:  apixaban  5 mg Oral BID   calcium carbonate  500 mg of elemental calcium Oral BID WC   cholecalciferol  2,000 Units Oral Daily   diclofenac Sodium  2 g Topical TID   docusate sodium  100 mg Oral BID   gabapentin  200 mg Oral QHS   ipratropium-albuterol  3 mL Nebulization Q4H   metoprolol succinate  25 mg Oral Daily   pantoprazole  40 mg Oral BID   simvastatin  10 mg Oral QHS   Continuous Infusions:  ampicillin-sulbactam (UNASYN) IV Stopped (03/02/22 0536)   PRN Meds:.acetaminophen, acetaminophen, albuterol, dextromethorphan-guaiFENesin, hydrALAZINE, ondansetron (ZOFRAN) IV Medications Prior to Admission:  Prior to Admission medications   Medication Sig Start Date End Date Taking? Authorizing Provider  acetaminophen (TYLENOL) 500 MG tablet  Take 500 mg by mouth every 6 (six) hours as needed.   Yes [provider]  albuterol (VENTOLIN HFA) 108 (90 Base) MCG/ACT inhaler TAKE 2 PUFFS BY MOUTH EVERY 6 HOURS AS NEEDED FOR WHEEZE OR SHORTNESS OF BREATH Patient taking differently: Inhale 2 puffs into the lungs every 6 (six) hours as needed for wheezing or shortness of breath. TAKE 2 PUFFS BY MOUTH EVERY 6 HOURS AS NEEDED FOR WHEEZE OR SHORTNESS OF BREATH 11/12/21  Yes [name], MD  amLODipine (NORVASC) 5 MG tablet TAKE 1 TABLET EVERY DAY 11/29/21  Yes [name], MD  apixaban (ELIQUIS) 5 MG TABS tablet Take 5 mg by mouth 2 (two) times daily. 01/31/22  Yes [provider]  benzonatate (TESSALON) 200 MG capsule Take 200 mg by mouth 3 (three) times daily. 02/14/22  Yes [provider]  calcium carbonate (OSCAL) 1500 (600 Ca) MG TABS tablet Take 600 mg of elemental calcium by mouth 2 (two) times daily with a meal.   Yes [provider]  Cholecalciferol (VITAMIN D3) 50 MCG (2000 UT) capsule Take 2,000 Units by mouth daily.   Yes [provider]  diclofenac Sodium (VOLTAREN) 1 % GEL Apply 2 g topically 3 (three) times daily. 11/20/21  Yes Fritzi Mandes, MD  docusate sodium (COLACE) 100 MG capsule Take 100 mg by mouth 2 (two) times daily.   Yes [provider]  fexofenadine (ALLEGRA) 60 MG tablet TAKE 1 TABLET EVERY DAY Patient taking differently: Take 60 mg by mouth daily. 08/29/21  Yes Dutch Quint B, FNP  furosemide (LASIX) 20 MG tablet Take 1 tablet (20 mg total) by mouth daily. 12/11/21 02/28/22 Yes Max Sane, MD  gabapentin (NEURONTIN) 100 MG capsule TAKE 2 CAPSULES AT BEDTIME Patient taking differently: Take 200 mg by mouth  at bedtime. TAKE 2 CAPSULES AT BEDTIME 05/18/20  Yes [name], MD  ipratropium (ATROVENT) 0.06 % nasal spray Place 2 sprays into both nostrils 4 (four) times daily. 02/14/22  Yes [provider]  irbesartan (AVAPRO) 300 MG tablet Take 300 mg by mouth  daily. 02/03/22  Yes [provider]  metoCLOPramide (REGLAN) 5 MG tablet Take 5 mg by mouth 2 (two) times daily. 11/29/21  Yes [provider]  metoprolol succinate (TOPROL-XL) 25 MG 24 hr tablet TAKE 1 TABLET TWICE DAILY Patient taking differently: Take 25 mg by mouth daily. 01/16/22  Yes [name], MD  pantoprazole (PROTONIX) 40 MG tablet Take 40 mg by mouth 2 (two) times daily. 11/29/21  Yes [provider]  simvastatin (ZOCOR) 10 MG tablet TAKE 1 TABLET (10 MG TOTAL) AT BEDTIME. Patient taking differently: Take 10 mg by mouth daily at 6 PM. 02/23/21   [name], MD  Spacer/Aero-Holding Josiah Lobo ([place]) [place]  06/26/19   [provider]   Allergies  Allergen Reactions   Fentanyl Other (See Comments)    confusion   Review of Systems  All other systems reviewed and are negative.   Physical Exam Constitutional:      Comments: Eyes closed.   Pulmonary:     Effort: Pulmonary effort is normal.     Vital Signs: BP (!) 152/62   Pulse 100   Temp 97.7 F (36.5 C)   Resp (!) 26   Ht '5\' 2"'$  (1.575 m)   Wt 72.3 kg   SpO2 94%   BMI 29.15 kg/m  Pain Scale: 0-10   Pain Score: Asleep   SpO2: SpO2: 94 % O2 Device:SpO2: 94 % O2 Flow Rate: .O2 Flow Rate (L/min): 1 L/min  IO: Intake/output summary:  Intake/Output Summary (Last 24 hours) at 03/02/2022 1147 Last data filed at 03/02/2022 0600 Gross per 24 hour  Intake 300 ml  Output 200 ml  Net 100 ml    LBM:   Baseline Weight: Weight: 72.3 kg Most recent weight: Weight: 72.3 kg      Signed by: Asencion Gowda, NP   Please contact Palliative Medicine Team phone at [phone] for questions and concerns.  For individual provider: See Shea Evans

## 2022-03-02 NOTE — Plan of Care (Signed)
  Problem: Activity: Goal: Ability to tolerate increased activity will improve Outcome: Progressing   Problem: Respiratory: Goal: Ability to maintain adequate ventilation will improve Outcome: Progressing   Problem: Education: Goal: Knowledge of General Education information will improve Description: Including pain rating scale, medication(s)/side effects and non-pharmacologic comfort measures Outcome: Progressing

## 2022-03-02 NOTE — TOC Initial Note (Signed)
Transition of Care [facility]) - Initial/Assessment Note    Patient Details  Name: [name] MRN: [id number] Date of Birth: [birth year]  [facility]) CM/SW Contact:    Shelbie Hutching, RN Phone Number: 03/02/2022, 12:44 PM  Clinical Narrative:                 Patient admitted to the hospital with aspiration pneumonia.  Patient was recently discharge on 1/10 from the hospital to home with home health services through [place] and with a hoyer lift.   RNCM met with daughter Zigmund Daniel and patient at the bedside in the ED.  Spoke with Daughter [name] via phone.  Family would like for patient to go to rehab at [facility], patient has been there before and they really like the care there.  They would like to see if she can improve to where she can sit on the side of the bed and stand and pivot.  If this proves to be unachievable with rehab then they would take her home with [facility].  RNCM will begin SNF workup.     Expected Discharge Plan: Skilled Nursing Facility Barriers to Discharge: Continued Medical Work up   Patient Goals and CMS Choice Patient states their goals for this hospitalization and ongoing recovery are:: daughter's would like for patient to try and rehab at [facility] and if it doesn't work, she continues to decline, be followed by Outpatient palliative/[facility] with [facility] CMS Medicare.gov Compare Post Acute Care list provided to:: Patient Represenative (must comment) Choice offered to / list presented to : Adult Children      Expected Discharge Plan and Services   Discharge Planning Services: CM Consult Post Acute Care Choice: [place] arrangements for the past 2 months: Single Family Home                 DME Arranged: N/A DME Agency: NA                  Prior Living Arrangements/Services Living arrangements for the past 2 months: Single Family Home Lives with:: Spouse Patient language and need for interpreter reviewed:: Yes         Need for Family Participation in Patient Care: Yes (Comment) Care giver support system in place?: Yes (comment) Current home services: DME, Home RN, Home PT, Home OT, Homehealth aide Criminal Activity/Legal Involvement Pertinent to Current Situation/Hospitalization: No - Comment as needed  Activities of Daily Living Home Assistive Devices/Equipment: Civil Service fast streamer, Environmental consultant (specify type), Wheelchair ADL Screening (condition at time of admission) Patient's cognitive ability adequate to safely complete daily activities?: No Is the patient deaf or have difficulty hearing?: Yes Does the patient have difficulty seeing, even when wearing glasses/contacts?: Yes Does the patient have difficulty concentrating, remembering, or making decisions?: Yes Patient able to express need for assistance with ADLs?: Yes Does the patient have difficulty dressing or bathing?: Yes Independently performs ADLs?: No Communication: Independent with device (comment) Dressing (OT): Needs assistance Is this a change from baseline?: Pre-admission baseline Grooming: Needs assistance Is this a change from baseline?: Pre-admission baseline Feeding: Independent Bathing: Needs assistance Is this a change from baseline?: Pre-admission baseline Toileting: Needs assistance Is this a change from baseline?: Pre-admission baseline In/Out Bed: Dependent Is this a change from baseline?: Pre-admission baseline Walks in Home: Dependent Is this a change from baseline?: Pre-admission baseline Does the patient have difficulty walking or climbing stairs?: Yes Weakness of Legs: Both Weakness of Arms/Hands: Both  Permission  Sought/Granted Permission sought to share information with : Case Manager, Family Supports    Share Information with NAME: [name] Marshell Levan  Permission granted to share info w AGENCY: [facility]  Permission granted to share info w Relationship: daughter  Permission granted to share info w Contact Information:  [phone]  Emotional Assessment Appearance:: Appears stated age Attitude/Demeanor/Rapport: Engaged Affect (typically observed): Accepting Orientation: : Oriented to Self Alcohol / Substance Use: Not Applicable Psych Involvement: No (comment)  Admission diagnosis:  Acute on chronic diastolic CHF (congestive heart failure) (HCC) [I50.33] Aspiration pneumonia (HCC) [J69.0] Patient Active Problem List   Diagnosis Date Noted   DNR (do not resuscitate) 03/01/2022   Chronic diastolic CHF (congestive heart failure) ([place]) 02/28/2022   Normocytic anemia 02/28/2022   Acute renal failure superimposed on stage 3a chronic kidney disease ([place]) 09/81/1914   Acute metabolic encephalopathy 78/29/5621   Aspiration pneumonia ([place]) 02/28/2022   (HFpEF) heart failure with preserved ejection fraction ([place]) 02/19/2022   SOB (shortness of breath) 12/14/2021   Pulmonary embolism ([place]) 12/14/2021   Shortness of breath 12/13/2021   Pneumonia due to COVID-19 virus 12/13/2021   Hyperkalemia 12/13/2021   Hyponatremia 12/13/2021   At risk for polypharmacy 12/13/2021   Acute on chronic diastolic heart failure ([place]) 12/09/2021   Paroxysmal atrial fibrillation ([place]) 11/16/2021   Blood loss anemia 11/16/2021   Acute respiratory failure with hypoxia ([place]) 11/16/2021   Elevated temperature 11/16/2021   Aortic atherosclerosis ([place]) 08/02/2020   Fall 12/29/2019   Weakness 06/09/2019   Wheezing 04/16/2019   Back skin lesion 12/04/2018   Restless legs syndrome 11/25/2018   PAD (peripheral artery disease) ([place]) 04/07/2018   Cold foot 05/15/2017   Constipation 08/15/2016   AKI (acute kidney injury) ([place]) 04/13/2016   Small bowel obstruction ([place]) 11/24/2015   Swelling of left lower extremity 07/16/2015   Acute Ileitis 06/12/2015   Hypokalemia 06/12/2015   Accelerated hypertension 06/12/2015   Hereditary hemochromatosis ([place]) 05/25/2015   Open leg wound 05/19/2015   Lower extremity edema 05/19/2015   Iron  excess 04/16/2015   Dizziness 10/26/2014   Hyperbilirubinemia 10/26/2014   Health care maintenance 05/18/2014   Anemia, iron deficiency 04/21/2014   Hospital discharge follow-up 04/14/2014   Hot flashes 03/07/2014   Face lesion 11/03/2013   Numbness in feet 10/08/2012   Nocturia 10/08/2012   Essential hypertension 12/18/2011   Hypercholesteremia 12/18/2011   History of colon cancer 12/18/2011   Barrett's esophagus 12/18/2011   Chronic back pain 12/18/2011   Osteopenia 12/18/2011   PCP:  [name], MD Pharmacy:   [facility] [place], [place] [address] AT [facility] [address] [place] [address] Phone: [phone] Fax: [phone]     Social Determinants of Health (SDOH) Social History: SDOH Screenings   Food Insecurity: No Food Insecurity (03/01/2022)  Housing: Low Risk  (03/01/2022)  Transportation Needs: No Transportation Needs (03/01/2022)  Utilities: Not At Risk (03/01/2022)  Depression (PHQ2-9): Low Risk  (11/30/2020)  Financial Resource Strain: Low Risk  (11/30/2020)  Physical Activity: Unknown (11/30/2020)  Social Connections: Unknown (11/30/2020)  Stress: No Stress Concern Present (11/30/2020)  Tobacco Use: Low Risk  (03/01/2022)   SDOH Interventions:     Readmission Risk Interventions    02/21/2022    9:54 AM  Readmission Risk Prevention Plan  Transportation Screening Complete  HRI or Home Care Consult Complete  Palliative Care Screening Complete

## 2022-03-02 NOTE — ED Notes (Signed)
Transport arrived to transport pt. Pt stable at time of departure 

## 2022-03-02 NOTE — ED Notes (Signed)
Called for transport for patient

## 2022-03-02 NOTE — TOC Progression Note (Signed)
Transition of Care [facility]) - Progression Note    Patient Details  Name: [name] MRN: [id number] Date of Birth: 12/12/1928  Transition of Care [facility]) CM/SW [place], RN Phone Number: 03/02/2022, 3:58 PM  Clinical Narrative:    Spoke with [name] the daughter and reviewed the bed offers They accepted the bed offer at [facility], I explained once she is medically ready we will get Ins approval   Expected Discharge Plan: [place] Barriers to Discharge: Continued Medical Work up  Expected Discharge Plan and Services   Discharge Planning Services: CM Consult Post Acute Care Choice: [place] Living arrangements for the past 2 months: Single Family Home                 DME Arranged: N/A DME Agency: NA                   Social Determinants of Health (SDOH) Interventions SDOH Screenings   Food Insecurity: No Food Insecurity (03/01/2022)  Housing: Low Risk  (03/01/2022)  Transportation Needs: No Transportation Needs (03/01/2022)  Utilities: Not At Risk (03/01/2022)  Depression (PHQ2-9): Low Risk  (11/30/2020)  Financial Resource Strain: Low Risk  (11/30/2020)  Physical Activity: Unknown (11/30/2020)  Social Connections: Unknown (11/30/2020)  Stress: No Stress Concern Present (11/30/2020)  Tobacco Use: Low Risk  (03/01/2022)    Readmission Risk Interventions    03/02/2022   12:47 PM 02/21/2022    9:54 AM  Readmission Risk Prevention Plan  Transportation Screening Complete Complete  Medication Review (RN Care Manager) Complete   PCP or Specialist appointment within 3-5 days of discharge Complete   HRI or [place] Complete Complete  SW Recovery Care/Counseling Consult Complete   Palliative Care Screening Complete Complete  [place] Complete

## 2022-03-02 NOTE — Progress Notes (Signed)
Physical Therapy Treatment Patient Details Name: [name] MRN: [id number] DOB: December 16, 1928 Today's Date: 03/02/2022   History of Present Illness 89 y/o female presented to ED on 02/28/22 for AMS and SOB. Recent admission 1/6-1/10 for PE. Now admitted for possible aspiration pneumonia and UTI. PMH: Afib, HTN, hx of PE, CHF, CKD stage 3a, PVD    PT Comments    Patient seen in conjunction with OT to maximize patient's tolerance. Daughter in room and reporting family is looking into rehab at discharge. Patient currently requiring mod-maxA+2 for bed mobility but able to tolerate sitting EOB x 20 minutes with verbal and tactile cues to maintain midline positioning. Improved activity tolerance this session compared to previous. Discharge recommendation updated to SNF.    Recommendations for follow up therapy are one component of a multi-disciplinary discharge planning process, led by the attending physician.  Recommendations may be updated based on patient status, additional functional criteria and insurance authorization.  Follow Up Recommendations  Skilled nursing-short term rehab (<3 hours/day) Can patient physically be transported by private vehicle: No   Assistance Recommended at Discharge Frequent or constant Supervision/Assistance  Patient can return home with the following Two people to help with walking and/or transfers;Two people to help with bathing/dressing/bathroom;Assistance with cooking/housework;Assistance with feeding;Direct supervision/assist for medications management;Direct supervision/assist for financial management;Assist for transportation;Help with stairs or ramp for entrance   Equipment Recommendations  None recommended by PT    Recommendations for Other Services       Precautions / Restrictions Precautions Precautions: Fall Restrictions Weight Bearing Restrictions: No     Mobility  Bed Mobility Overal bed mobility: Needs Assistance Bed Mobility: Supine to  Sit, Sit to Supine     Supine to sit: Mod assist, Max assist, +2 for physical assistance Sit to supine: Max assist, +2 for physical assistance, +2 for safety/equipment   General bed mobility comments: BLEs towards edge of bed in supine with cueing    Transfers Overall transfer level: Needs assistance Equipment used: Rolling Jenna Ardoin (2 wheels), 2 person hand held assist Transfers: Sit to/from Stand Sit to Stand: Max assist, +2 physical assistance           General transfer comment: improved technique with 2 person HHA vs RW with multi modal cueing; attempted lateral steps up the bed requiring MAX A +2, unable to advance    Ambulation/Gait                   Stairs             Wheelchair Mobility    Modified Rankin (Stroke Patients Only)       Balance Overall balance assessment: Needs assistance Sitting-balance support: Bilateral upper extremity supported, Feet unsupported Sitting balance-Leahy Scale: Fair Sitting balance - Comments: L lateral lean with pt able to self correct approx 50% of the time with verbal/tactile cueing, static sitting balance on edge of bed for functional tasks for approx 20 minutes Postural control: Left lateral lean Standing balance support: Bilateral upper extremity supported, During functional activity, Reliant on assistive device for balance Standing balance-Leahy Scale: Poor                              Cognition Arousal/Alertness: Awake/alert Behavior During Therapy: WFL for tasks assessed/performed Overall Cognitive Status: Impaired/Different from baseline Area of Impairment: Orientation, Attention, Awareness, Problem solving                 Orientation  Level: Disoriented to, Time, Situation Current Attention Level: Sustained       Awareness: Intellectual Problem Solving: Slow processing, Requires verbal cues, Requires tactile cues          Exercises      General Comments General comments  (skin integrity, edema, etc.): vital signs monitored and appear stable throughout      Pertinent Vitals/Pain Pain Assessment Pain Assessment: Faces Faces Pain Scale: Hurts a little bit Pain Location: generalized Pain Descriptors / Indicators: Discomfort, Grimacing Pain Intervention(s): Monitored during session    Home Living                          Prior Function            PT Goals (current goals can now be found in the care plan section) Acute Rehab PT Goals PT Goal Formulation: With patient/family Time For Goal Achievement: 03/15/22 Potential to Achieve Goals: Fair Progress towards PT goals: Progressing toward goals    Frequency    Min 2X/week      PT Plan Discharge plan needs to be updated    Co-evaluation PT/OT/SLP Co-Evaluation/Treatment: Yes Reason for Co-Treatment: Necessary to address cognition/behavior during functional activity;Complexity of the patient's impairments (multi-system involvement);For patient/therapist safety;To address functional/ADL transfers PT goals addressed during session: Mobility/safety with mobility;Balance OT goals addressed during session: ADL's and self-care      AM-PAC PT "6 Clicks" Mobility   Outcome Measure  Help needed turning from your back to your side while in a flat bed without using bedrails?: Total Help needed moving from lying on your back to sitting on the side of a flat bed without using bedrails?: Total Help needed moving to and from a bed to a chair (including a wheelchair)?: Total Help needed standing up from a chair using your arms (e.g., wheelchair or bedside chair)?: Total Help needed to walk in hospital room?: Total Help needed climbing 3-5 steps with a railing? : Total 6 Click Score: 6    End of Session   Activity Tolerance: Patient tolerated treatment well Patient left: in bed;with call bell/phone within reach;with bed alarm set;with family/visitor present Nurse Communication: Mobility  status PT Visit Diagnosis: Unsteadiness on feet (R26.81);Muscle weakness (generalized) (M62.81);Other abnormalities of gait and mobility (R26.89)     Time: 1751-0258 PT Time Calculation (min) (ACUTE ONLY): 28 min  Charges:  $Therapeutic Activity: 8-22 mins                     Nikkia Devoss A. Gilford Rile PT, DPT [facility] - Acute Rehabilitation Services    Teonia Yager A Shreyan Hinz 03/02/2022, 2:19 PM

## 2022-03-02 NOTE — Progress Notes (Signed)
Occupational Therapy Treatment Patient Details Name: [name] MRN: [id number] DOB: 1928/05/30 Today's Date: 03/02/2022   History of present illness 89 y/o female presented to ED on 02/28/22 for AMS and SOB. Recent admission 1/6-1/10 for PE. Now admitted for possible aspiration pneumonia and UTI. PMH: Afib, HTN, hx of PE, CHF, CKD stage 3a, PVD   OT comments  Chart reviewed, pt greeted in bed agreeable to OT tx session. Co tx completed with PT on this date. Pt daughter in room endorsees that family is now considering rehab for patient as she was performing stand pivot, walking very short distances with assistance in parallel bars after recent discharge from STR, has had a functional decline. Improvements noted in functional activity tolerance, ADL performance on this date. Discharge recommendation has been updated. OT will continue to follow acutely.    Recommendations for follow up therapy are one component of a multi-disciplinary discharge planning process, led by the attending physician.  Recommendations may be updated based on patient status, additional functional criteria and insurance authorization.    Follow Up Recommendations  Skilled nursing-short term rehab (<3 hours/day)     Assistance Recommended at Discharge Frequent or constant Supervision/Assistance  Patient can return home with the following  Two people to help with bathing/dressing/bathroom;Two people to help with walking and/or transfers;Direct supervision/assist for medications management;Direct supervision/assist for financial management;Assistance with cooking/housework;Assist for transportation;Help with stairs or ramp for entrance;Assistance with feeding   Equipment Recommendations  Other (comment) (defer)    Recommendations for Other Services      Precautions / Restrictions Precautions Precautions: Fall Restrictions Weight Bearing Restrictions: No       Mobility Bed Mobility Overal bed mobility: Needs  Assistance Bed Mobility: Supine to Sit, Sit to Supine     Supine to sit: Mod assist, Max assist, +2 for physical assistance Sit to supine: Max assist, +2 for physical assistance, +2 for safety/equipment   General bed mobility comments: BLEs towards edge of bed in supine with cueing    Transfers Overall transfer level: Needs assistance Equipment used: Rolling walker (2 wheels), 2 person hand held assist Transfers: Sit to/from Stand Sit to Stand: Max assist, +2 physical assistance           General transfer comment: improved technique with 2 person HHA vs RW with multi modal cueing; attempted lateral steps up the bed requiring MAX A +2, unable to advance     Balance Overall balance assessment: Needs assistance Sitting-balance support: Bilateral upper extremity supported, Feet unsupported Sitting balance-Leahy Scale: Fair Sitting balance - Comments: L lateral lean with pt able to self correct approx 50% of the time with verbal/tactile cueing, static sitting balance on edge of bed for functional tasks for approx 20 minutes   Standing balance support: Bilateral upper extremity supported, During functional activity, Reliant on assistive device for balance Standing balance-Leahy Scale: Poor                             ADL either performed or assessed with clinical judgement   ADL Overall ADL's : Needs assistance/impaired                                       General ADL Comments: MAX A for brushing hair, MIN A for washing face, brushing teeth seated at edge of the bed    Extremity/Trunk Assessment  Vision       Perception     Praxis      Cognition Arousal/Alertness: Awake/alert Behavior During Therapy: WFL for tasks assessed/performed Overall Cognitive Status: Impaired/Different from baseline Area of Impairment: Orientation, Attention, Awareness, Problem solving                 Orientation Level: Disoriented to,  Time, Situation Current Attention Level: Sustained       Awareness: Intellectual Problem Solving: Slow processing, Requires verbal cues, Requires tactile cues          Exercises      Shoulder Instructions       General Comments vital signs monitored and appear stable throughout    Pertinent Vitals/ Pain       Pain Assessment Pain Assessment: Faces Faces Pain Scale: Hurts a little bit Pain Location: generalized Pain Descriptors / Indicators: Discomfort, Grimacing Pain Intervention(s): Monitored during session, Repositioned  Home Living                                          Prior Functioning/Environment              Frequency  Min 2X/week        Progress Toward Goals  OT Goals(current goals can now be found in the care plan section)  Progress towards OT goals: Progressing toward goals     Plan Discharge plan needs to be updated    Co-evaluation    PT/OT/SLP Co-Evaluation/Treatment: Yes Reason for Co-Treatment: Complexity of the patient's impairments (multi-system involvement);Necessary to address cognition/behavior during functional activity;For patient/therapist safety   OT goals addressed during session: ADL's and self-care      AM-PAC OT "6 Clicks" Daily Activity     Outcome Measure   Help from another person eating meals?: A Lot Help from another person taking care of personal grooming?: A Lot Help from another person toileting, which includes using toliet, bedpan, or urinal?: Total Help from another person bathing (including washing, rinsing, drying)?: Total Help from another person to put on and taking off regular upper body clothing?: A Lot Help from another person to put on and taking off regular lower body clothing?: Total 6 Click Score: 9    End of Session    OT Visit Diagnosis: Unsteadiness on feet (R26.81);Muscle weakness (generalized) (M62.81)   Activity Tolerance Patient tolerated treatment well   Patient  Left in bed;with call bell/phone within reach;with bed alarm set;with family/visitor present   Nurse Communication          Time: 3300-7622 OT Time Calculation (min): 28 min  Charges: OT General Charges $OT Visit: 1 Visit OT Treatments $Self Care/Home Management : 8-22 mins  Shanon Payor, OTD OTR/L  03/02/22, 1:57 PM

## 2022-03-03 DIAGNOSIS — D500 Iron deficiency anemia secondary to blood loss (chronic): Secondary | ICD-10-CM | POA: Diagnosis not present

## 2022-03-03 DIAGNOSIS — I5032 Chronic diastolic (congestive) heart failure: Secondary | ICD-10-CM | POA: Diagnosis not present

## 2022-03-03 DIAGNOSIS — J690 Pneumonitis due to inhalation of food and vomit: Secondary | ICD-10-CM | POA: Diagnosis not present

## 2022-03-03 LAB — COMPREHENSIVE METABOLIC PANEL
ALT: 11 U/L (ref 0–44)
AST: 14 U/L — ABNORMAL LOW (ref 15–41)
Albumin: 2.2 g/dL — ABNORMAL LOW (ref 3.5–5.0)
Alkaline Phosphatase: 43 U/L (ref 38–126)
Anion gap: 7 (ref 5–15)
BUN: 22 mg/dL (ref 8–23)
CO2: 24 mmol/L (ref 22–32)
Calcium: 8.3 mg/dL — ABNORMAL LOW (ref 8.9–10.3)
Chloride: 104 mmol/L (ref 98–111)
Creatinine, Ser: 1.06 mg/dL — ABNORMAL HIGH (ref 0.44–1.00)
GFR, Estimated: 50 mL/min — ABNORMAL LOW (ref >60)
Glucose, Bld: 103 mg/dL — ABNORMAL HIGH (ref 70–99)
Potassium: 3.8 mmol/L (ref 3.5–5.1)
Sodium: 135 mmol/L (ref 135–145)
Total Bilirubin: 1 mg/dL (ref 0.3–1.2)
Total Protein: 5.2 g/dL — ABNORMAL LOW (ref 6.5–8.1)

## 2022-03-03 LAB — CBC WITH DIFFERENTIAL/PLATELET
Abs Immature Granulocytes: 0.05 10*3/uL (ref 0.00–0.07)
Basophils Absolute: 0 10*3/uL (ref 0.0–0.1)
Basophils Relative: 1 %
Eosinophils Absolute: 0.2 10*3/uL (ref 0.0–0.5)
Eosinophils Relative: 2 %
HCT: 23.4 % — ABNORMAL LOW (ref 36.0–46.0)
Hemoglobin: 7.7 g/dL — ABNORMAL LOW (ref 12.0–15.0)
Immature Granulocytes: 1 %
Lymphocytes Relative: 9 %
Lymphs Abs: 0.8 10*3/uL (ref 0.7–4.0)
MCH: 29.3 pg (ref 26.0–34.0)
MCHC: 32.9 g/dL (ref 30.0–36.0)
MCV: 89 fL (ref 80.0–100.0)
Monocytes Absolute: 0.6 10*3/uL (ref 0.1–1.0)
Monocytes Relative: 8 %
Neutro Abs: 6.5 10*3/uL (ref 1.7–7.7)
Neutrophils Relative %: 79 %
Platelets: 421 10*3/uL — ABNORMAL HIGH (ref 150–400)
RBC: 2.63 MIL/uL — ABNORMAL LOW (ref 3.87–5.11)
RDW: 15.4 % (ref 11.5–15.5)
WBC: 8.2 10*3/uL (ref 4.0–10.5)
nRBC: 0 % (ref 0.0–0.2)

## 2022-03-03 LAB — MAGNESIUM: Magnesium: 1.7 mg/dL (ref 1.7–2.4)

## 2022-03-03 LAB — GLUCOSE, CAPILLARY: Glucose-Capillary: 99 mg/dL (ref 70–99)

## 2022-03-03 MED ORDER — AMOXICILLIN-POT CLAVULANATE 875-125 MG PO TABS
1.0000 | ORAL_TABLET | Freq: Two times a day (BID) | ORAL | Status: AC
  Administered 2022-03-03 – 2022-03-05 (×6): 1 via ORAL
  Filled 2022-03-03 (×6): qty 1

## 2022-03-03 MED ORDER — SODIUM CHLORIDE 0.9 % IV SOLN
3.0000 g | Freq: Four times a day (QID) | INTRAVENOUS | Status: DC
  Filled 2022-03-03: qty 8

## 2022-03-03 NOTE — Plan of Care (Signed)
  Problem: Elimination: Goal: Will not experience complications related to urinary retention Outcome: Progressing   Problem: Pain Managment: Goal: General experience of comfort will improve Outcome: Progressing   Problem: Safety: Goal: Ability to remain free from injury will improve Outcome: Progressing   

## 2022-03-03 NOTE — Progress Notes (Signed)
Progress Note    [name]  XQJ:[id number] DOB: 01-24-1929  DOA: 02/28/2022 PCP: [name], MD      Brief Narrative:    Medical records reviewed and are as summarized below:  [name] is a 89 y.o. female  with PMH of chronic diastolic CHF, A-fib and PE on Eliquis, HTN, HLD, hereditary hemochromatosis, colon cancer s/p surgical resection and chemo, GERD, ambulatory dysfunction, incontinence, recent hospitalization from 1/6 through 02/23/2022 for influenza B infection and bacterial pneumonia.  She presented to the hospital with altered mental status, productive cough and shortness of breath.  Reportedly, oxygen saturation was in the 70s on room air when EMS arrived.  She was placed on 100% oxygen via nonrebreather mask and brought to the emergency department for further management.   She was admitted to the hospital for aspiration pneumonia, acute metabolic encephalopathy, AKI.     Assessment/Plan:   Principal Problem:   Aspiration pneumonia ([facility]) Active Problems:   Blood loss anemia   Acute metabolic encephalopathy   Paroxysmal atrial fibrillation (HCC)   Pulmonary embolism (HCC)   Essential hypertension   Chronic diastolic CHF (congestive heart failure) (HCC)   Hypercholesteremia   Restless legs syndrome   Normocytic anemia   Acute renal failure superimposed on stage 3a chronic kidney disease ([place])   DNR (do not resuscitate)    Body mass index is 29.44 kg/m.  Aspiration pneumonia: COVID-19, influenza and RSV tests were negative.  No growth on blood cultures thus far.  Change IV Unasyn to Augmentin to complete 5 days of treatment.  Aspiration precautions.  Acute metabolic encephalopathy: Improved.  Continue supportive care.  CT head did not show any acute abnormality.  AKI on CKD stage IIIa: Creatinine is improving.  Monitor creatinine off of IV fluids.  Acute hypoxic respiratory failure: Improved.  She is tolerating room air.  Paroxysmal atrial  fibrillation: Continue metoprolol and Eliquis  Acute blood loss anemia: Hemoglobin is trending down.  Continue to monitor..  S/p transfusion with 1 unit of PRBCs on 03/01/2022.  Probable dysphagia, regurgitation, history of Barrett's esophagus: Aspiration precautions.  Continue Protonix.  Chronic diastolic CHF, peripheral edema: Continue Lasix  Other comorbidities include history of pulmonary embolism on Eliquis, hypercholesterolemia    Diet Order             DIET DYS 3 Room service appropriate? Yes with Assist; Fluid consistency: Thin  Diet effective now                            Consultants: None  Procedures: None    Medications:    amoxicillin-clavulanate  1 tablet Oral Q12H   apixaban  5 mg Oral BID   calcium carbonate  500 mg of elemental calcium Oral BID WC   cholecalciferol  2,000 Units Oral Daily   docusate sodium  100 mg Oral BID   furosemide  20 mg Oral Daily   gabapentin  200 mg Oral QHS   ipratropium-albuterol  3 mL Nebulization BID   metoprolol succinate  25 mg Oral Daily   pantoprazole  40 mg Oral BID   simvastatin  10 mg Oral QHS   Continuous Infusions:     Anti-infectives (From admission, onward)    Start     Dose/Rate Route Frequency Ordered Stop   03/03/22 1230  Ampicillin-Sulbactam (UNASYN) 3 g in sodium chloride 0.9 % 100 mL IVPB  Status:  Discontinued  3 g 200 mL/hr over 30 Minutes Intravenous Every 6 hours 03/03/22 0658 03/03/22 0757   03/03/22 1000  amoxicillin-clavulanate (AUGMENTIN) 875-125 MG per tablet 1 tablet        1 tablet Oral Every 12 hours 03/03/22 0757 03/06/22 0959   02/28/22 1600  Ampicillin-Sulbactam (UNASYN) 3 g in sodium chloride 0.9 % 100 mL IVPB  Status:  Discontinued        3 g 200 mL/hr over 30 Minutes Intravenous Every 12 hours 02/28/22 1552 03/03/22 0658              Family Communication/Anticipated D/C date and plan/Code Status   DVT prophylaxis:  apixaban (ELIQUIS) tablet 5 mg      Code Status: DNR  Family Communication: Plan discussed with [name], daughter, at the bedside Disposition Plan: Plan to discharge to SNF in 2 days   Status is: Inpatient Remains inpatient appropriate because: IV antibiotics       Subjective:   Interval events noted.  She complains of cough otherwise she feels okay.  [name], daughter, was at the bedside and she reported that patient refused short of breath with conversation.  Objective:    Vitals:   03/02/22 2312 03/03/22 0500 03/03/22 0726 03/03/22 0824  BP: 125/64   (!) 127/57  Pulse: 92   97  Resp: 20   18  Temp: 98.5 F (36.9 C)   98.2 F (36.8 C)  TempSrc:      SpO2: 93%  92% 94%  Weight:  73 kg    Height:       No data found.   Intake/Output Summary (Last 24 hours) at 03/03/2022 1545 Last data filed at 03/03/2022 1147 Gross per 24 hour  Intake 250 ml  Output 925 ml  Net -675 ml   Filed Weights   02/28/22 1137 03/03/22 0500  Weight: 72.3 kg 73 kg    Exam:  GEN: NAD SKIN: Warm and dry EYES: No pallor or icterus ENT: MMM CV: Irregular rate and rhythm PULM: Bibasilar rales, no wheezing ABD: soft, ND, NT, +BS CNS: AAO x 3, non focal EXT: Significant edema of upper and lower extremities, no tenderness    Pressure Injury 02/19/22 Buttocks redness and breakdown on the buttocks (Active)  02/19/22 2200  Location: Buttocks  Location Orientation:   Staging:   Wound Description (Comments): redness and breakdown on the buttocks  Present on Admission: Yes  Dressing Type Foam - Lift dressing to assess site every shift 03/03/22 1355     Data Reviewed:   I have personally reviewed following labs and imaging studies:  Labs: Labs show the following:   Basic Metabolic Panel: Recent Labs  Lab 02/28/22 1142 03/01/22 0411 03/02/22 0450 03/03/22 0449  NA 131* 133* 130* 135  K 3.5 3.4* 3.4* 3.8  CL 97* 101 99 104  CO2 22 23 21* 24  GLUCOSE 98 110* 108* 103*  BUN 28* 25* 24* 22  CREATININE 2.41*  1.87* 1.22* 1.06*  CALCIUM 8.2* 7.8* 8.4* 8.3*  MG  --   --  1.9 1.7  PHOS  --   --  3.8  --    GFR Estimated Creatinine Clearance: 32.4 mL/min (A) (by C-G formula based on SCr of 1.06 mg/dL (H)). Liver Function Tests: Recent Labs  Lab 03/02/22 0450 03/03/22 0449  AST  --  14*  ALT  --  11  ALKPHOS  --  43  BILITOT  --  1.0  PROT  --  5.2*  ALBUMIN  2.3* 2.2*   No results for input(s): "LIPASE", "AMYLASE" in the last 168 hours. No results for input(s): "AMMONIA" in the last 168 hours. Coagulation profile No results for input(s): "INR", "PROTIME" in the last 168 hours.  CBC: Recent Labs  Lab 02/28/22 1142 03/01/22 0411 03/02/22 0450 03/03/22 0449  WBC 10.8* 12.8* 10.0 8.2  NEUTROABS 7.8*  --   --  6.5  HGB 7.8* 7.5* 8.2* 7.7*  HCT 24.9* 23.6* 24.9* 23.4*  MCV 91.9 91.1 88.9 89.0  PLT 507* 431* 461* 421*   Cardiac Enzymes: Recent Labs  Lab 03/02/22 0450  CKTOTAL 144   BNP (last 3 results) No results for input(s): "PROBNP" in the last 8760 hours. CBG: Recent Labs  Lab 02/28/22 1142 03/01/22 0812 03/02/22 0754 03/03/22 0733  GLUCAP 92 104* 112* 99   D-Dimer: No results for input(s): "DDIMER" in the last 72 hours. Hgb A1c: No results for input(s): "HGBA1C" in the last 72 hours. Lipid Profile: No results for input(s): "CHOL", "HDL", "LDLCALC", "TRIG", "CHOLHDL", "LDLDIRECT" in the last 72 hours. Thyroid function studies: No results for input(s): "TSH", "T4TOTAL", "T3FREE", "THYROIDAB" in the last 72 hours.  Invalid input(s): "FREET3" Anemia work up: No results for input(s): "VITAMINB12", "FOLATE", "FERRITIN", "TIBC", "IRON", "RETICCTPCT" in the last 72 hours. Sepsis Labs: Recent Labs  Lab 02/28/22 1142 02/28/22 1214 02/28/22 1215 03/01/22 0411 03/02/22 0450 03/03/22 0449  PROCALCITON  --  <0.10  --   --   --   --   WBC 10.8*  --   --  12.8* 10.0 8.2  LATICACIDVEN  --   --  0.9  --   --   --     Microbiology Recent Results (from the past 240  hour(s))  Resp panel by RT-PCR (RSV, Flu A&B, Covid) Anterior Nasal Swab     Status: None   Collection Time: 02/28/22 12:14 PM   Specimen: Anterior Nasal Swab  Result Value Ref Range Status   SARS Coronavirus 2 by RT PCR NEGATIVE NEGATIVE Final    Comment: (NOTE) SARS-CoV-2 target nucleic acids are NOT DETECTED.  The SARS-CoV-2 RNA is generally detectable in upper respiratory specimens during the acute phase of infection. The lowest concentration of SARS-CoV-2 viral copies this assay can detect is 138 copies/mL. A negative result does not preclude SARS-Cov-2 infection and should not be used as the sole basis for treatment or other patient management decisions. A negative result may occur with  improper specimen collection/handling, submission of specimen other than nasopharyngeal swab, presence of viral mutation(s) within the areas targeted by this assay, and inadequate number of viral copies(<138 copies/mL). A negative result must be combined with clinical observations, patient history, and epidemiological information. The expected result is Negative.  Fact Sheet for Patients:  EntrepreneurPulse.com.au  Fact Sheet for Healthcare Providers:  IncredibleEmployment.be  This test is no t yet approved or cleared by the [place] FDA and  has been authorized for detection and/or diagnosis of SARS-CoV-2 by FDA under an Emergency Use Authorization (EUA). This EUA will remain  in effect (meaning this test can be used) for the duration of the COVID-19 declaration under Section 564(b)(1) of the Act, 21 U.S.C.section 360bbb-3(b)(1), unless the authorization is terminated  or revoked sooner.       Influenza A by PCR NEGATIVE NEGATIVE Final   Influenza B by PCR NEGATIVE NEGATIVE Final    Comment: (NOTE) The Xpert Xpress SARS-CoV-2/FLU/RSV plus assay is intended as an aid in the diagnosis of influenza from Nasopharyngeal swab  specimens and should not  be used as a sole basis for treatment. Nasal washings and aspirates are unacceptable for Xpert Xpress SARS-CoV-2/FLU/RSV testing.  Fact Sheet for Patients: EntrepreneurPulse.com.au  Fact Sheet for Healthcare Providers: IncredibleEmployment.be  This test is not yet approved or cleared by the [place] FDA and has been authorized for detection and/or diagnosis of SARS-CoV-2 by FDA under an Emergency Use Authorization (EUA). This EUA will remain in effect (meaning this test can be used) for the duration of the COVID-19 declaration under Section 564(b)(1) of the Act, 21 U.S.C. section 360bbb-3(b)(1), unless the authorization is terminated or revoked.     Resp Syncytial Virus by PCR NEGATIVE NEGATIVE Final    Comment: (NOTE) Fact Sheet for Patients: EntrepreneurPulse.com.au  Fact Sheet for Healthcare Providers: IncredibleEmployment.be  This test is not yet approved or cleared by the [place] FDA and has been authorized for detection and/or diagnosis of SARS-CoV-2 by FDA under an Emergency Use Authorization (EUA). This EUA will remain in effect (meaning this test can be used) for the duration of the COVID-19 declaration under Section 564(b)(1) of the Act, 21 U.S.C. section 360bbb-3(b)(1), unless the authorization is terminated or revoked.  Performed at [facility], [address], [place], [place] [address]   Urine Culture     Status: None   Collection Time: 02/28/22 12:14 PM   Specimen: Urine, Suprapubic  Result Value Ref Range Status   Specimen Description   Final    URINE, SUPRAPUBIC Performed at [facility], [address] [place], [place] [address]    Special Requests   Final    NONE Performed at [facility], [address], [place], [place] [address]    Culture   Final    NO GROWTH Performed at [facility], [place] [address], [place], [place]  [address]    Report Status 03/01/2022 FINAL  Final  Culture, blood (routine x 2) Call MD if unable to obtain prior to antibiotics being given     Status: None (Preliminary result)   Collection Time: 02/28/22  4:40 PM   Specimen: BLOOD  Result Value Ref Range Status   Specimen Description BLOOD BLOOD LEFT ARM  Final   Special Requests   Final    BOTTLES DRAWN AEROBIC AND ANAEROBIC Blood Culture results may not be optimal due to an inadequate volume of blood received in culture bottles   Culture   Final    NO GROWTH 3 DAYS Performed at [facility], [address] [place], [place] [address]    Report Status PENDING  Incomplete  Culture, blood (routine x 2) Call MD if unable to obtain prior to antibiotics being given     Status: None (Preliminary result)   Collection Time: 02/28/22  4:41 PM   Specimen: BLOOD  Result Value Ref Range Status   Specimen Description BLOOD BLOOD LEFT HAND  Final   Special Requests   Final    BOTTLES DRAWN AEROBIC AND ANAEROBIC Blood Culture adequate volume   Culture   Final    NO GROWTH 3 DAYS Performed at [facility], [address] [place],  [address]    Report Status PENDING  Incomplete    Procedures and diagnostic studies:  No results found.             LOS: 3 days   Luca Dyar  [facility]   Pager on [email or web address]. If 7PM-7AM, please contact night-coverage at [email or web address]     03/03/2022,  3:45 PM

## 2022-03-03 NOTE — Consult Note (Signed)
Pharmacy Antibiotic Note  [name] is a 89 y.o. female admitted on 02/28/2022 with  aspiration pneumonia .  Pharmacy has been consulted for Unasyn dosing.  Scr has improved to 1.06 from 2.41 on admission.  Plan: Adjust Unasyn dose to 3 grams IV every 6 hours.  Height: '5\' 2"'$  (157.5 cm) Weight: 73 kg (160 lb 15 oz) IBW/kg (Calculated) : 50.1  Temp (24hrs), Avg:98.6 F (37 C), Min:98 F (36.7 C), Max:99.3 F (37.4 C)  Recent Labs  Lab 02/28/22 1142 02/28/22 1215 03/01/22 0411 03/02/22 0450 03/03/22 0449  WBC 10.8*  --  12.8* 10.0 8.2  CREATININE 2.41*  --  1.87* 1.22* 1.06*  LATICACIDVEN  --  0.9  --   --   --     Estimated Creatinine Clearance: 32.4 mL/min (A) (by C-G formula based on SCr of 1.06 mg/dL (H)).    Allergies  Allergen Reactions   Fentanyl Other (See Comments)    confusion    Antimicrobials this admission: Unasyn 1/15 >>    Dose adjustments this admission: Unasyn 3g IV q12 to q6 hours  Microbiology results: 1/15 BCx: ngtd 1/15 UCx: NG  1/15 Sputum: ordered   Thank you for allowing pharmacy to be a part of this patient's care.  Lorin Picket, PharmD 03/03/2022 6:59 AM

## 2022-03-03 NOTE — Care Management Important Message (Signed)
Important Message  Patient Details  Name: GENESE QUEBEDEAUX MRN: [id number] Date of Birth: 03-24-1928   Medicare Important Message Given:  Yes     Juliann Pulse A Laelynn Blizzard 03/03/2022, 11:10 AM

## 2022-03-03 NOTE — Progress Notes (Signed)
Speech Language Pathology Treatment: Dysphagia  Patient Details Name: [name] MRN: [id number] DOB: 08-04-1928 Today's Date: 03/03/2022 Time: 0830-0900 SLP Time Calculation (min) (ACUTE ONLY): 30 min  Assessment / Plan / Recommendation Clinical Impression  Pt seen for ongoing toleration of diet this morning w/ education on aspiration and REFLUX precautions w/ family present. Daughter present again and stated SNF placement w/ more long term care is being decided as their D/C option. Noted Palliative Care consultation in chart. Pt A/O x3; engaged verbally to answer basic questions re: self and followed basic commands w/ cue. Min HOH(?). Intermittent, congested cough noted at Baseline in bed PRIOR to po's given. Noted UE tremors bilaterally - min increased per Dtr since hospitalization/illnesses. On RA; afebrile, WBC WNL now.    OF NOTE: Pt has a Baseline of a Large Hiatal Hernia w/ reports of belching and potential retrograde backflow of po's at times at home. Any such Esophageal phase Dysmotility can increase risk for aspiration of REFLUX material. Pt is also more bed/chair-bound and leans to her Left side chronically per Dtr.   Pt appears to present w/ continued functional oropharyngeal phase swallowing w/ no immediate, overt oropharyngeal phase swallowing deficits, no neuromuscular deficits, nor change/decline in respiratory status noted during po intake. Pt appears at reduced risk for aspiration from an oropharyngeal phase standpoint when following general aspiration precautions.  HOWEVER, pt has a baseline presentation of LARGE Hiatal Hernia and REFLUX w/ Sedentary physical status at home in recent month. ANY Dysmotility or Regurgitation of Reflux material can increase risk for aspiration of the Reflux material during Retrograde backflow thus impact Voicing and Pulmonary status, including aspiration pneumonia especially if more sedentary. Recommend getting out of bed for meals daily.    Pt  continues to require positioning support for midline and upright for eating/drinking. She consumed several trials of thin liquids Via Cup and softened solid foods w/ puree w/ No immediate, overt clinical s/s of aspiration noted; clear vocal quality b/t trials, no decline in pulmonary status, no multiple swallows noted post initial pharyngeal swallow. A mild, congested cough (x1) noted b/t trials -- similar to baseline cough but did NOT increase in intensity or nature as po trials continued. Unsure if related to baseline pulmonary congestion or to Esophageal phase dysmotility/Large hiatal hernia. Oral phase appeared [facility] for bolus management and timely A-P transfer/clearing of material. Mastication adequate for boluses(softened) but min Time given b/t trials for pt to fully clear mouth.    Recommend continue a more Mech Soft diet w/ Meats MINCED (moistened foods) w/ thin liquids via Cup - no straw d/t air swallowed. General aspiration precautions and support w/ feeding at meals d/t UE tremors. Rest Breaks during meals/oral intake to allow for Esophageal clearing. STRICT REFLUX precautions strongly recommended to lessen chance for Regurgitation -- HOB elevated at night when sleeping.    Recommend pt f/u w/ GI for assessment/management and Education of Esophageal phase Dysmotility as indicated. Discussion given on REFLUX, impact of Esophageal Dysmotility on swallowing and breathing, behaviors to manage dysmotility, and foods in diet. MD to reconsult ST services if any new needs while admitted. NSG updated. Daughter appreciative of Education information and f/u.      HPI HPI: Pt is a 89 y/o female presented to ED on 02/28/22 for AMS and SOB. Recent admission 1/6-1/10 for PE and pneumonia then, per Daughter/chart notes. Currently admitted for pneumonia and UTI.  PMH: LARGE Hiatal Hernia, Afib, HTN, hx of PE, CHF, CKD stage 3a, PVD.  Recently hospitalized from 1/6-1/10 for influenza B and bacterial pneumonia and  discharged on p.o. Levaquin.  CXR: Patchy consolidation of the left mid lung and left lung base,  pneumonia is not excluded.  Reports productive cough with whitish phlegm.      SLP Plan  All goals met      Recommendations for follow up therapy are one component of a multi-disciplinary discharge planning process, led by the attending physician.  Recommendations may be updated based on patient status, additional functional criteria and insurance authorization.    Recommendations  Diet recommendations: Dysphagia 3 (mechanical soft);Thin liquid (meats minced) Liquids provided via: Cup;No straw Medication Administration: Whole meds with puree (vs need to Crush in puree) Supervision: Patient able to self feed;Staff to assist with self feeding;Intermittent supervision to cue for compensatory strategies Compensations: Minimize environmental distractions;Slow rate;Small sips/bites;Lingual sweep for clearance of pocketing;Follow solids with liquid Postural Changes and/or Swallow Maneuvers: Out of bed for meals;Seated upright 90 degrees;Upright 30-60 min after meal                General recommendations:  (Palliative Care following; Dietician) Oral Care Recommendations: Oral care BID;Oral care before and after PO;Staff/trained caregiver to provide oral care Follow Up Recommendations: No SLP follow up (appears at/close to her baseline per Dtr's report) Assistance recommended at discharge: Frequent or constant Supervision/Assistance SLP Visit Diagnosis: Dysphagia, pharyngoesophageal phase (R13.14) (Large hiatal hernia; advanced age) Plan: All goals met             Orinda Kenner, [place], [place]; [place] [phone] (ascom) Jaydeen Odor  03/03/2022, 1:36 PM

## 2022-03-04 DIAGNOSIS — I480 Paroxysmal atrial fibrillation: Secondary | ICD-10-CM | POA: Diagnosis not present

## 2022-03-04 DIAGNOSIS — J690 Pneumonitis due to inhalation of food and vomit: Secondary | ICD-10-CM | POA: Diagnosis not present

## 2022-03-04 DIAGNOSIS — I5032 Chronic diastolic (congestive) heart failure: Secondary | ICD-10-CM | POA: Diagnosis not present

## 2022-03-04 LAB — GLUCOSE, CAPILLARY
Glucose-Capillary: 109 mg/dL — ABNORMAL HIGH (ref 70–99)
Glucose-Capillary: 121 mg/dL — ABNORMAL HIGH (ref 70–99)

## 2022-03-04 LAB — BASIC METABOLIC PANEL
Anion gap: 7 (ref 5–15)
BUN: 17 mg/dL (ref 8–23)
CO2: 25 mmol/L (ref 22–32)
Calcium: 8.9 mg/dL (ref 8.9–10.3)
Chloride: 104 mmol/L (ref 98–111)
Creatinine, Ser: 0.94 mg/dL (ref 0.44–1.00)
GFR, Estimated: 57 mL/min — ABNORMAL LOW (ref >60)
Glucose, Bld: 113 mg/dL — ABNORMAL HIGH (ref 70–99)
Potassium: 3.6 mmol/L (ref 3.5–5.1)
Sodium: 136 mmol/L (ref 135–145)

## 2022-03-04 LAB — CBC
HCT: 30.4 % — ABNORMAL LOW (ref 36.0–46.0)
Hemoglobin: 9.7 g/dL — ABNORMAL LOW (ref 12.0–15.0)
MCH: 28.7 pg (ref 26.0–34.0)
MCHC: 31.9 g/dL (ref 30.0–36.0)
MCV: 89.9 fL (ref 80.0–100.0)
Platelets: 467 10*3/uL — ABNORMAL HIGH (ref 150–400)
RBC: 3.38 MIL/uL — ABNORMAL LOW (ref 3.87–5.11)
RDW: 15.3 % (ref 11.5–15.5)
WBC: 9.3 10*3/uL (ref 4.0–10.5)
nRBC: 0 % (ref 0.0–0.2)

## 2022-03-04 LAB — MAGNESIUM: Magnesium: 1.6 mg/dL — ABNORMAL LOW (ref 1.7–2.4)

## 2022-03-04 MED ORDER — MAGNESIUM SULFATE 2 GM/50ML IV SOLN
2.0000 g | Freq: Once | INTRAVENOUS | Status: AC
  Administered 2022-03-04: 2 g via INTRAVENOUS
  Filled 2022-03-04: qty 50

## 2022-03-04 NOTE — Progress Notes (Signed)
I was notified from CNA that during transfer from chair to bed patient had a skin tear. I went and assessed pt. Cleaned skin tear placed steri strips and dressing. MD made aware and placed orders for dressing change. Daughter also at bedside during incident. Will continue to monitor.

## 2022-03-04 NOTE — Progress Notes (Addendum)
Progress Note    [name]  FBP:[id number] DOB: [birth year]  DOA: 02/28/2022 PCP: [name], MD      Brief Narrative:    Medical records reviewed and are as summarized below:  [name] is a 89 y.o. female  with PMH of chronic diastolic CHF, A-fib and PE on Eliquis, HTN, HLD, hereditary hemochromatosis, colon cancer s/p surgical resection and chemo, GERD, ambulatory dysfunction, incontinence, recent hospitalization from 1/6 through 02/23/2022 for influenza B infection and bacterial pneumonia.  She presented to the hospital with altered mental status, productive cough and shortness of breath.  Reportedly, oxygen saturation was in the 70s on room air when EMS arrived.  She was placed on 100% oxygen via nonrebreather mask and brought to the emergency department for further management.   She was admitted to the hospital for aspiration pneumonia, acute metabolic encephalopathy, AKI.     Assessment/Plan:   Principal Problem:   Aspiration pneumonia ([place]) Active Problems:   Blood loss anemia   Acute metabolic encephalopathy   Paroxysmal atrial fibrillation (HCC)   Pulmonary embolism (HCC)   Essential hypertension   Chronic diastolic CHF (congestive heart failure) (HCC)   Hypercholesteremia   Restless legs syndrome   Normocytic anemia   Acute renal failure superimposed on stage 3a chronic kidney disease ([place])   DNR (do not resuscitate)    Body mass index is 29.84 kg/m.  Aspiration pneumonia: COVID-19, influenza and RSV tests were negative.  No growth on blood cultures thus far.  Continue Augmentin through 03/05/2022.  Continue aspiration precautions.  Acute metabolic encephalopathy: Improved.  Continue supportive care.  CT head did not show any acute abnormality.  AKI on CKD stage IIIa: Creatinine is improved to baseline.   Acute hypoxic respiratory failure: Improved.  She is tolerating room air.  Paroxysmal atrial fibrillation: Continue metoprolol and  Eliquis  Skin tear on the left arm: Continue local wound care  Hypomagnesemia: Replete with IV magnesium sulfate  Acute blood loss anemia: H&H improved.  Continue to monitor..  S/p transfusion with 1 unit of PRBCs on 03/01/2022.  Probable dysphagia, regurgitation, history of Barrett's esophagus: Aspiration precautions.  Continue Protonix.  Chronic diastolic CHF, peripheral edema: Continue Lasix  Other comorbidities include history of pulmonary embolism on Eliquis, hypercholesterolemia    Diet Order             DIET DYS 3 Room service appropriate? Yes with Assist; Fluid consistency: Thin  Diet effective now                            Consultants: None  Procedures: None    Medications:    amoxicillin-clavulanate  1 tablet Oral Q12H   apixaban  5 mg Oral BID   calcium carbonate  500 mg of elemental calcium Oral BID WC   cholecalciferol  2,000 Units Oral Daily   docusate sodium  100 mg Oral BID   furosemide  20 mg Oral Daily   gabapentin  200 mg Oral QHS   ipratropium-albuterol  3 mL Nebulization BID   metoprolol succinate  25 mg Oral Daily   pantoprazole  40 mg Oral BID   simvastatin  10 mg Oral QHS   Continuous Infusions:     Anti-infectives (From admission, onward)    Start     Dose/Rate Route Frequency Ordered Stop   03/03/22 1230  Ampicillin-Sulbactam (UNASYN) 3 g in sodium chloride 0.9 % 100 mL IVPB  Status:  Discontinued        3 g 200 mL/hr over 30 Minutes Intravenous Every 6 hours 03/03/22 0658 03/03/22 0757   03/03/22 1000  amoxicillin-clavulanate (AUGMENTIN) 875-125 MG per tablet 1 tablet        1 tablet Oral Every 12 hours 03/03/22 0757 03/06/22 0959   02/28/22 1600  Ampicillin-Sulbactam (UNASYN) 3 g in sodium chloride 0.9 % 100 mL IVPB  Status:  Discontinued        3 g 200 mL/hr over 30 Minutes Intravenous Every 12 hours 02/28/22 1552 03/03/22 0658              Family Communication/Anticipated D/C date and plan/Code Status    DVT prophylaxis:  apixaban (ELIQUIS) tablet 5 mg     Code Status: DNR  Family Communication: Plan discussed with family at the bedside Disposition Plan: Plan to discharge to SNF in 2 days   Status is: Inpatient Remains inpatient appropriate because: IV antibiotics       Subjective:   Interval events noted.  She feels weak.  CNA attempted to get her out of bed into the chair but patient was so weak she had to be placed back into the bed.  She had a skin tear with bleeding on the left arm that occurred spontaneously this morning.  Family was at the bedside [[name] and Jeani Hawking (daughters), husband and [name]'s husband].  Objective:    Vitals:   03/03/22 0824 03/03/22 1600 03/03/22 2212 03/04/22 0500  BP: (!) 127/57 131/65 (!) 131/48   Pulse: 97 80 80   Resp: 18  17   Temp: 98.2 F (36.8 C) 98.9 F (37.2 C) 98.3 F (36.8 C)   TempSrc:      SpO2: 94% 93% 93%   Weight:    74 kg  Height:       No data found.   Intake/Output Summary (Last 24 hours) at 03/04/2022 1217 Last data filed at 03/04/2022 1134 Gross per 24 hour  Intake 480 ml  Output 400 ml  Net 80 ml   Filed Weights   02/28/22 1137 03/03/22 0500 03/04/22 0500  Weight: 72.3 kg 73 kg 74 kg    Exam:   GEN: NAD SKIN: Warm and dry.  Skin tear on the left arm  EYES: EOMI ENT: MMM CV: Irregular rate and rhythm PULM: CTA B ABD: soft, ND, NT, +BS CNS: AAO x 3, non focal EXT: Edema of bilateral upper and lower extremities, no tenderness    Data Reviewed:   I have personally reviewed following labs and imaging studies:  Labs: Labs show the following:   Basic Metabolic Panel: Recent Labs  Lab 02/28/22 1142 03/01/22 0411 03/02/22 0450 03/03/22 0449 03/04/22 0935  NA 131* 133* 130* 135 136  K 3.5 3.4* 3.4* 3.8 3.6  CL 97* 101 99 104 104  CO2 22 23 21* 24 25  GLUCOSE 98 110* 108* 103* 113*  BUN 28* 25* 24* 22 17  CREATININE 2.41* 1.87* 1.22* 1.06* 0.94  CALCIUM 8.2* 7.8* 8.4* 8.3* 8.9  MG  --   --   1.9 1.7 1.6*  PHOS  --   --  3.8  --   --    GFR Estimated Creatinine Clearance: 36.7 mL/min (by C-G formula based on SCr of 0.94 mg/dL). Liver Function Tests: Recent Labs  Lab 03/02/22 0450 03/03/22 0449  AST  --  14*  ALT  --  11  ALKPHOS  --  43  BILITOT  --  1.0  PROT  --  5.2*  ALBUMIN 2.3* 2.2*   No results for input(s): "LIPASE", "AMYLASE" in the last 168 hours. No results for input(s): "AMMONIA" in the last 168 hours. Coagulation profile No results for input(s): "INR", "PROTIME" in the last 168 hours.  CBC: Recent Labs  Lab 02/28/22 1142 03/01/22 0411 03/02/22 0450 03/03/22 0449 03/04/22 0935  WBC 10.8* 12.8* 10.0 8.2 9.3  NEUTROABS 7.8*  --   --  6.5  --   HGB 7.8* 7.5* 8.2* 7.7* 9.7*  HCT 24.9* 23.6* 24.9* 23.4* 30.4*  MCV 91.9 91.1 88.9 89.0 89.9  PLT 507* 431* 461* 421* 467*   Cardiac Enzymes: Recent Labs  Lab 03/02/22 0450  CKTOTAL 144   BNP (last 3 results) No results for input(s): "PROBNP" in the last 8760 hours. CBG: Recent Labs  Lab 02/28/22 1142 03/01/22 0812 03/02/22 0754 03/03/22 0733  GLUCAP 92 104* 112* 99   D-Dimer: No results for input(s): "DDIMER" in the last 72 hours. Hgb A1c: No results for input(s): "HGBA1C" in the last 72 hours. Lipid Profile: No results for input(s): "CHOL", "HDL", "LDLCALC", "TRIG", "CHOLHDL", "LDLDIRECT" in the last 72 hours. Thyroid function studies: No results for input(s): "TSH", "T4TOTAL", "T3FREE", "THYROIDAB" in the last 72 hours.  Invalid input(s): "FREET3" Anemia work up: No results for input(s): "VITAMINB12", "FOLATE", "FERRITIN", "TIBC", "IRON", "RETICCTPCT" in the last 72 hours. Sepsis Labs: Recent Labs  Lab 02/28/22 1214 02/28/22 1215 03/01/22 0411 03/02/22 0450 03/03/22 0449 03/04/22 0935  PROCALCITON <0.10  --   --   --   --   --   WBC  --   --  12.8* 10.0 8.2 9.3  LATICACIDVEN  --  0.9  --   --   --   --     Microbiology Recent Results (from the past 240 hour(s))  Resp  panel by RT-PCR (RSV, Flu A&B, Covid) Anterior Nasal Swab     Status: None   Collection Time: 02/28/22 12:14 PM   Specimen: Anterior Nasal Swab  Result Value Ref Range Status   SARS Coronavirus 2 by RT PCR NEGATIVE NEGATIVE Final    Comment: (NOTE) SARS-CoV-2 target nucleic acids are NOT DETECTED.  The SARS-CoV-2 RNA is generally detectable in upper respiratory specimens during the acute phase of infection. The lowest concentration of SARS-CoV-2 viral copies this assay can detect is 138 copies/mL. A negative result does not preclude SARS-Cov-2 infection and should not be used as the sole basis for treatment or other patient management decisions. A negative result may occur with  improper specimen collection/handling, submission of specimen other than nasopharyngeal swab, presence of viral mutation(s) within the areas targeted by this assay, and inadequate number of viral copies(<138 copies/mL). A negative result must be combined with clinical observations, patient history, and epidemiological information. The expected result is Negative.  Fact Sheet for Patients:  EntrepreneurPulse.com.au  Fact Sheet for Healthcare Providers:  IncredibleEmployment.be  This test is no t yet approved or cleared by the [place] FDA and  has been authorized for detection and/or diagnosis of SARS-CoV-2 by FDA under an Emergency Use Authorization (EUA). This EUA will remain  in effect (meaning this test can be used) for the duration of the COVID-19 declaration under Section 564(b)(1) of the Act, 21 U.S.C.section 360bbb-3(b)(1), unless the authorization is terminated  or revoked sooner.       Influenza A by PCR NEGATIVE NEGATIVE Final   Influenza B by PCR NEGATIVE NEGATIVE Final    Comment: (NOTE)  The Xpert Xpress SARS-CoV-2/FLU/RSV plus assay is intended as an aid in the diagnosis of influenza from Nasopharyngeal swab specimens and should not be used as a  sole basis for treatment. Nasal washings and aspirates are unacceptable for Xpert Xpress SARS-CoV-2/FLU/RSV testing.  Fact Sheet for Patients: EntrepreneurPulse.com.au  Fact Sheet for Healthcare Providers: IncredibleEmployment.be  This test is not yet approved or cleared by the [place] FDA and has been authorized for detection and/or diagnosis of SARS-CoV-2 by FDA under an Emergency Use Authorization (EUA). This EUA will remain in effect (meaning this test can be used) for the duration of the COVID-19 declaration under Section 564(b)(1) of the Act, 21 U.S.C. section 360bbb-3(b)(1), unless the authorization is terminated or revoked.     Resp Syncytial Virus by PCR NEGATIVE NEGATIVE Final    Comment: (NOTE) Fact Sheet for Patients: EntrepreneurPulse.com.au  Fact Sheet for Healthcare Providers: IncredibleEmployment.be  This test is not yet approved or cleared by the [place] FDA and has been authorized for detection and/or diagnosis of SARS-CoV-2 by FDA under an Emergency Use Authorization (EUA). This EUA will remain in effect (meaning this test can be used) for the duration of the COVID-19 declaration under Section 564(b)(1) of the Act, 21 U.S.C. section 360bbb-3(b)(1), unless the authorization is terminated or revoked.  Performed at [facility], [address] [place], [place] [address]   Urine Culture     Status: None   Collection Time: 02/28/22 12:14 PM   Specimen: Urine, Suprapubic  Result Value Ref Range Status   Specimen Description   Final    URINE, SUPRAPUBIC Performed at [facility], [address], [place], [place] [address]    Special Requests   Final    NONE Performed at [facility], [address] [place], [place] [address]    Culture   Final    NO GROWTH Performed at [facility], [place] [address] [place], [place] [address]     Report Status 03/01/2022 FINAL  Final  Culture, blood (routine x 2) Call MD if unable to obtain prior to antibiotics being given     Status: None (Preliminary result)   Collection Time: 02/28/22  4:40 PM   Specimen: BLOOD  Result Value Ref Range Status   Specimen Description BLOOD BLOOD LEFT ARM  Final   Special Requests   Final    BOTTLES DRAWN AEROBIC AND ANAEROBIC Blood Culture results may not be optimal due to an inadequate volume of blood received in culture bottles   Culture   Final    NO GROWTH 4 DAYS Performed at [facility], [address], [place],  [address]    Report Status PENDING  Incomplete  Culture, blood (routine x 2) Call MD if unable to obtain prior to antibiotics being given     Status: None (Preliminary result)   Collection Time: 02/28/22  4:41 PM   Specimen: BLOOD  Result Value Ref Range Status   Specimen Description BLOOD BLOOD LEFT HAND  Final   Special Requests   Final    BOTTLES DRAWN AEROBIC AND ANAEROBIC Blood Culture adequate volume   Culture   Final    NO GROWTH 4 DAYS Performed at [facility], [address] [place],  [address]    Report Status PENDING  Incomplete    Procedures and diagnostic studies:  No results found.             LOS: 4 days   Lenoir Facchini  [employer], advertising on [email or web address]. If 7PM-7AM, please contact night-coverage at [email or web address]     03/04/2022, 12:17 PM

## 2022-03-04 NOTE — TOC Progression Note (Signed)
Transition of Care [facility]) - Progression Note    Patient Details  Name: [name] MRN: [id number] Date of Birth: 1928-10-15  Transition of Care [facility] CM/SW [place], RN Phone Number: 03/04/2022, 1:32 PM  Clinical Narrative:    Spoke to the Daughter [name], she talked to [place] at [place], plan to DC Monday    Expected Discharge Plan: [place] Barriers to Discharge: Continued Medical Work up  Expected Discharge Plan and Services   Discharge Planning Services: CM Consult Post Acute Care Choice: [place] arrangements for the past 2 months: Single Family Home                 DME Arranged: N/A DME Agency: NA                   Social Determinants of Health (SDOH) Interventions SDOH Screenings   Food Insecurity: No Food Insecurity (03/01/2022)  Housing: Low Risk  (03/01/2022)  Transportation Needs: No Transportation Needs (03/01/2022)  Utilities: Not At Risk (03/01/2022)  Depression (PHQ2-9): Low Risk  (11/30/2020)  Financial Resource Strain: Low Risk  (11/30/2020)  Physical Activity: Unknown (11/30/2020)  Social Connections: Unknown (11/30/2020)  Stress: No Stress Concern Present (11/30/2020)  Tobacco Use: Low Risk  (03/01/2022)    Readmission Risk Interventions    03/02/2022   12:47 PM 02/21/2022    9:54 AM  Readmission Risk Prevention Plan  Transportation Screening Complete Complete  Medication Review (RN Care Manager) Complete   PCP or Specialist appointment within 3-5 days of discharge Complete   HRI or [place] Complete Complete  SW Recovery Care/Counseling Consult Complete   Palliative Care Screening Complete Complete  [place] Complete

## 2022-03-04 NOTE — Consult Note (Signed)
[facility] Nurse Consult Note: Reason for Consult: Consult requested for buttocks and bilat arms. Family members at the bedside to discuss plan of care and assess wound appearance.  Bilat buttocks are red, moist and macerated with scattered areas of partial thickness skin loss across approx 14X14X.1cm area.  Appearance is consistent with moisture associated skin damage.  Pt is frequently incontinent of urine and a Purewick is in place to attempt to contain urine.  ICD-10 CM Codes for Irritant Dermatitis L24A2 - Due to fecal, urinary or dual incontinence  Wound type: Pt has very thin fragile skin.  Left arm with full thickness abrasion; Steristrips are already in place to re- approximate a skin flap.  Wound is red without active bleeding; 5X1cm Right arm with healing full thickness wound from a few weeks ago, according to family member; 2X1X.1cm, red and dry Dressing procedure/placement/frequency: Topical treatment orders provided for bedside nurses to perform as follows to protect from further injury and promote healing: 1. Leave steri strips in place to left arm until they dry up and fall off.  Apply Xeroform gauze over the steri strips Q day, then cover with foam dressing.  (Change foam dressings Q 3 days or PRN soiling. ) 2. Foam dressing to bilat buttocks/sacrum and right arm.  Change Q 3 days or PRN soiling. Please re-consult if further assistance is needed.  Thank-you,  Julien Girt MSN, [place], [place], [place], [place]

## 2022-03-04 NOTE — Progress Notes (Signed)
Occupational Therapy Treatment Patient Details Name: [name] MRN: [id number] DOB: Sep 08, 1928 Today's Date: 03/04/2022   History of present illness 89 y/o female presented to ED on 02/28/22 for AMS and SOB. Recent admission 1/6-1/10 for PE. Now admitted for possible aspiration pneumonia and UTI. PMH: Afib, HTN, hx of PE, CHF, CKD stage 3a, PVD   OT comments  [name] made good effort and demonstrated improvement today. Her AMS appears to have resolved; she is alert and oriented x 3 this afternoon. She denies pain today, and vital signs remained WNL throughout session. Pt required Max A to move to EOB sitting and Max A + 2 to transfer bed<chair. This, however, is pt's baseline. She has 2 aides at her home each day (7 days/wk), as well as 4 daughters and SILs who live nearby and assist with care on a daily basis. Pt transfers from bed to [facility] or recliner with Max A from aides each AM, then transfers back to bed in the PM with Max A from a SIL. Pt reports feeling slightly weaker than her norm, so may benefit from STR; however, given that she is near her baseline level of fxl mobility and ADL performance, and that she has good support and assistance at home, HHOT could also be an appropriate DC option.    Recommendations for follow up therapy are one component of a multi-disciplinary discharge planning process, led by the attending physician.  Recommendations may be updated based on patient status, additional functional criteria and insurance authorization.    Follow Up Recommendations  Skilled nursing-short term rehab (<3 hours/day) (consider HHOT)     Assistance Recommended at Discharge Frequent or constant Supervision/Assistance  Patient can return home with the following  Two people to help with bathing/dressing/bathroom;Two people to help with walking and/or transfers;Direct supervision/assist for medications management;Direct supervision/assist for financial management;Assistance with  cooking/housework;Assist for transportation;Help with stairs or ramp for entrance   Equipment Recommendations       Recommendations for Other Services      Precautions / Restrictions Precautions Precautions: Fall Restrictions Weight Bearing Restrictions: No       Mobility Bed Mobility Overal bed mobility: Needs Assistance Bed Mobility: Supine to Sit     Supine to sit: Max assist     General bed mobility comments: pt requires frequent rest breaks during supine<sit, Max A for repositioning LE and for trunk control    Transfers Overall transfer level: Needs assistance Equipment used: 2 person hand held assist Transfers: Bed to chair/wheelchair/BSC     Squat pivot transfers: +2 physical assistance, Max assist             Balance Overall balance assessment: Needs assistance Sitting-balance support: Bilateral upper extremity supported, Feet supported Sitting balance-Leahy Scale: Fair   Postural control: Left lateral lean, Posterior lean   Standing balance-Leahy Scale: Poor                             ADL either performed or assessed with clinical judgement   ADL Overall ADL's : Needs assistance/impaired                                       General ADL Comments: Max A + 2 for bed supine<>sit and bed<>chair transfers    Extremity/Trunk Assessment Upper Extremity Assessment Upper Extremity Assessment: Generalized weakness   Lower Extremity  Assessment Lower Extremity Assessment: Generalized weakness   Cervical / Trunk Assessment Cervical / Trunk Assessment: Kyphotic    Vision       Perception     Praxis      Cognition Arousal/Alertness: Awake/alert Behavior During Therapy: WFL for tasks assessed/performed Overall Cognitive Status: Within Functional Limits for tasks assessed                                 General Comments: Pt oriented to time, place, and situation today        Exercises Other  Exercises Other Exercises: Educ re: importance of OOB mobility for increased strength, endurance, and to assist w/ clearing lung congestion    Shoulder Instructions       General Comments      Pertinent Vitals/ Pain       Pain Assessment Faces Pain Scale: No hurt  Home Living                                          Prior Functioning/Environment              Frequency  Min 2X/week        Progress Toward Goals  OT Goals(current goals can now be found in the care plan section)  Progress towards OT goals: Progressing toward goals  Acute Rehab OT Goals OT Goal Formulation: With patient/family Time For Goal Achievement: 03/15/22 Potential to Achieve Goals: [place] Discharge plan remains appropriate;Frequency remains appropriate;Discharge plan needs to be updated    Co-evaluation                 AM-PAC OT "6 Clicks" Daily Activity     Outcome Measure   Help from another person eating meals?: None Help from another person taking care of personal grooming?: A Little Help from another person toileting, which includes using toliet, bedpan, or urinal?: A Lot Help from another person bathing (including washing, rinsing, drying)?: A Lot Help from another person to put on and taking off regular upper body clothing?: A Little Help from another person to put on and taking off regular lower body clothing?: A Lot 6 Click Score: 16    End of Session Equipment Utilized During Treatment: Oxygen  OT Visit Diagnosis: Unsteadiness on feet (R26.81);Muscle weakness (generalized) (M62.81)   Activity Tolerance Patient tolerated treatment well   Patient Left in chair;with call bell/phone within reach;with family/visitor present   Nurse Communication          Time: 6237-6283 OT Time Calculation (min): 31 min  Charges: OT General Charges $OT Visit: 1 Visit OT Treatments $Self Care/Home Management : 23-37 mins Josiah Lobo, PhD, MS,  OTR/L 03/04/22, 2:57 PM

## 2022-03-04 NOTE — Progress Notes (Signed)
Physical Therapy Treatment Patient Details Name: [name] MRN: [id number] DOB: 22-Nov-1928 Today's Date: 03/04/2022   History of Present Illness 89 y/o female presented to ED on 02/28/22 for AMS and SOB. Recent admission 1/6-1/10 for PE. Now admitted for possible aspiration pneumonia and UTI. PMH: Afib, HTN, hx of PE, CHF, CKD stage 3a, PVD    PT Comments    Pt is agreeable to transfer to recliner. +2 for safety, however performed with total assist +1 via SPT. Pt able to follow commands and perform B LE there-ex. Will continue to progress as able.   Recommendations for follow up therapy are one component of a multi-disciplinary discharge planning process, led by the attending physician.  Recommendations may be updated based on patient status, additional functional criteria and insurance authorization.  Follow Up Recommendations  Skilled nursing-short term rehab (<3 hours/day) Can patient physically be transported by private vehicle: No   Assistance Recommended at Discharge Frequent or constant Supervision/Assistance  Patient can return home with the following Two people to help with walking and/or transfers;Two people to help with bathing/dressing/bathroom;Assistance with cooking/housework;Assistance with feeding;Direct supervision/assist for medications management;Direct supervision/assist for financial management;Assist for transportation;Help with stairs or ramp for entrance   Equipment Recommendations  None recommended by PT    Recommendations for Other Services       Precautions / Restrictions Precautions Precautions: Fall Restrictions Weight Bearing Restrictions: No     Mobility  Bed Mobility               General bed mobility comments: received seated at EOB upon arrival. Periods of unassisted sitting noted.    Transfers Overall transfer level: Needs assistance   Transfers: Bed to chair/wheelchair/BSC Sit to Stand: Total assist     Squat pivot transfers:  Total assist     General transfer comment: Able to place hands around therapist and pivot from bed->chair. Chair very close to bed with arm rest dropped. +2 assist for scooting back in chair    Ambulation/Gait               General Gait Details: unable   Stairs             Wheelchair Mobility    Modified Rankin (Stroke Patients Only)       Balance Overall balance assessment: Needs assistance Sitting-balance support: Bilateral upper extremity supported, Feet supported Sitting balance-Leahy Scale: Fair     Standing balance support: Bilateral upper extremity supported, During functional activity, Reliant on assistive device for balance Standing balance-Leahy Scale: Poor                              Cognition Arousal/Alertness: Awake/alert Behavior During Therapy: WFL for tasks assessed/performed Overall Cognitive Status: Within Functional Limits for tasks assessed                                 General Comments: appears nervous about mobility        Exercises Other Exercises Other Exercises: Pt performed seated ther-ex on B LE including LAQ, alt marching, hip add squeezes, AP, and attempted SLRs. Needs max assist for SLRs. Encouraged pt to sit in recliner for 2 hours. Other Exercises: During mobility, O2 sats / HR remained WNL    General Comments        Pertinent Vitals/Pain Pain Assessment Pain Assessment: No/denies pain    Home Living  Prior Function            PT Goals (current goals can now be found in the care plan section) Acute Rehab PT Goals Patient Stated Goal: daughter's goal is for patient to be able to stand pivot with assistance PT Goal Formulation: With patient/family Time For Goal Achievement: 03/15/22 Potential to Achieve Goals: Fair Progress towards PT goals: Progressing toward goals    Frequency    Min 2X/week      PT Plan Current plan remains  appropriate    Co-evaluation              AM-PAC PT "6 Clicks" Mobility   Outcome Measure  Help needed turning from your back to your side while in a flat bed without using bedrails?: Total Help needed moving from lying on your back to sitting on the side of a flat bed without using bedrails?: Total Help needed moving to and from a bed to a chair (including a wheelchair)?: Total Help needed standing up from a chair using your arms (e.g., wheelchair or bedside chair)?: Total Help needed to walk in hospital room?: Total Help needed climbing 3-5 steps with a railing? : Total 6 Click Score: 6    End of Session Equipment Utilized During Treatment: Gait belt Activity Tolerance: Patient tolerated treatment well Patient left: in chair;with chair alarm set Nurse Communication: Mobility status PT Visit Diagnosis: Unsteadiness on feet (R26.81);Muscle weakness (generalized) (M62.81);Other abnormalities of gait and mobility (R26.89)     Time: 1610-9604 PT Time Calculation (min) (ACUTE ONLY): 21 min  Charges:  $Therapeutic Exercise: 8-22 mins                     Greggory Stallion, PT, DPT, GCS [phone]    [name] 03/04/2022, 4:09 PM

## 2022-03-04 NOTE — TOC Progression Note (Signed)
Transition of Care [facility]) - Progression Note    Patient Details  Name: [name] MRN: [id number] Date of Birth: [birth year]  Transition of Care [facility]) CM/SW [place], RN Phone Number: 03/04/2022, 11:28 AM  Clinical Narrative:    [facility], TOC to conrinue to foloow for additional needs, Ins will need approved prior to DC   Expected Discharge Plan: [place] Barriers to Discharge: Continued Medical Work up  Expected Discharge Plan and Services   Discharge Planning Services: CM Consult Post Acute Care Choice: [place] arrangements for the past 2 months: Single Family Home                 DME Arranged: N/A DME Agency: NA                   Social Determinants of Health (SDOH) Interventions SDOH Screenings   Food Insecurity: No Food Insecurity (03/01/2022)  Housing: Low Risk  (03/01/2022)  Transportation Needs: No Transportation Needs (03/01/2022)  Utilities: Not At Risk (03/01/2022)  Depression (PHQ2-9): Low Risk  (11/30/2020)  Financial Resource Strain: Low Risk  (11/30/2020)  Physical Activity: Unknown (11/30/2020)  Social Connections: Unknown (11/30/2020)  Stress: No Stress Concern Present (11/30/2020)  Tobacco Use: Low Risk  (03/01/2022)    Readmission Risk Interventions    03/02/2022   12:47 PM 02/21/2022    9:54 AM  Readmission Risk Prevention Plan  Transportation Screening Complete Complete  Medication Review (RN Care Manager) Complete   PCP or Specialist appointment within 3-5 days of discharge Complete   HRI or [place] Complete Complete  SW Recovery Care/Counseling Consult Complete   Palliative Care Screening Complete Complete  [place] Complete

## 2022-03-04 NOTE — Plan of Care (Signed)
  Problem: Clinical Measurements: Goal: Diagnostic test results will improve Outcome: Progressing Goal: Respiratory complications will improve Outcome: Progressing   Problem: Nutrition: Goal: Adequate nutrition will be maintained Outcome: Progressing   Problem: Elimination: Goal: Will not experience complications related to bowel motility Outcome: Progressing Goal: Will not experience complications related to urinary retention Outcome: Progressing   Problem: Pain Managment: Goal: General experience of comfort will improve Outcome: Progressing

## 2022-03-05 DIAGNOSIS — I5032 Chronic diastolic (congestive) heart failure: Secondary | ICD-10-CM | POA: Diagnosis not present

## 2022-03-05 DIAGNOSIS — J690 Pneumonitis due to inhalation of food and vomit: Secondary | ICD-10-CM | POA: Diagnosis not present

## 2022-03-05 LAB — CULTURE, BLOOD (ROUTINE X 2)
Culture: NO GROWTH
Culture: NO GROWTH
Special Requests: ADEQUATE

## 2022-03-05 LAB — GLUCOSE, CAPILLARY
Glucose-Capillary: 94 mg/dL (ref 70–99)
Glucose-Capillary: 105 mg/dL — ABNORMAL HIGH (ref 70–99)
Glucose-Capillary: 100 mg/dL — ABNORMAL HIGH (ref 70–99)

## 2022-03-05 LAB — CBC WITH DIFFERENTIAL/PLATELET
Abs Immature Granulocytes: 0.02 10*3/uL (ref 0.00–0.07)
Basophils Absolute: 0 10*3/uL (ref 0.0–0.1)
Basophils Relative: 1 %
Eosinophils Absolute: 0.2 10*3/uL (ref 0.0–0.5)
Eosinophils Relative: 3 %
HCT: 24.5 % — ABNORMAL LOW (ref 36.0–46.0)
Hemoglobin: 7.8 g/dL — ABNORMAL LOW (ref 12.0–15.0)
Immature Granulocytes: 0 %
Lymphocytes Relative: 13 %
Lymphs Abs: 0.9 10*3/uL (ref 0.7–4.0)
MCH: 28.8 pg (ref 26.0–34.0)
MCHC: 31.8 g/dL (ref 30.0–36.0)
MCV: 90.4 fL (ref 80.0–100.0)
Monocytes Absolute: 0.5 10*3/uL (ref 0.1–1.0)
Monocytes Relative: 7 %
Neutro Abs: 5.5 10*3/uL (ref 1.7–7.7)
Neutrophils Relative %: 76 %
Platelets: 416 10*3/uL — ABNORMAL HIGH (ref 150–400)
RBC: 2.71 MIL/uL — ABNORMAL LOW (ref 3.87–5.11)
RDW: 15.1 % (ref 11.5–15.5)
WBC: 7.2 10*3/uL (ref 4.0–10.5)
nRBC: 0 % (ref 0.0–0.2)

## 2022-03-05 NOTE — Progress Notes (Signed)
Progress Note    [name]  IRJ:[id number] DOB: 1928/03/16  DOA: 02/28/2022 PCP: [name], MD      Brief Narrative:    Medical records reviewed and are as summarized below:  [name] is a 89 y.o. female  with PMH of chronic diastolic CHF, A-fib and PE on Eliquis, HTN, HLD, hereditary hemochromatosis, colon cancer s/p surgical resection and chemo, GERD, ambulatory dysfunction, incontinence, recent hospitalization from 1/6 through 02/23/2022 for influenza B infection and bacterial pneumonia.  She presented to the hospital with altered mental status, productive cough and shortness of breath.  Reportedly, oxygen saturation was in the 70s on room air when EMS arrived.  She was placed on 100% oxygen via nonrebreather mask and brought to the emergency department for further management.   She was admitted to the hospital for aspiration pneumonia, acute metabolic encephalopathy, AKI.     Assessment/Plan:   Principal Problem:   Aspiration pneumonia ([place]) Active Problems:   Blood loss anemia   Acute metabolic encephalopathy   Paroxysmal atrial fibrillation (HCC)   Pulmonary embolism (HCC)   Essential hypertension   Chronic diastolic CHF (congestive heart failure) (HCC)   Hypercholesteremia   Restless legs syndrome   Normocytic anemia   Acute renal failure superimposed on stage 3a chronic kidney disease ([place])   DNR (do not resuscitate)    Body mass index is 29.96 kg/m.  Aspiration pneumonia: COVID-19, influenza and RSV tests were negative.  No growth on blood cultures.  Plan to complete Augmentin today.  Acute metabolic encephalopathy: Improved.  Continue supportive care.  CT head did not show any acute abnormality.  AKI on CKD stage IIIa: Creatinine is improved to baseline.   Acute hypoxic respiratory failure: Improved.  She is tolerating room air.  Paroxysmal atrial fibrillation: Continue metoprolol and Eliquis  Skin tear on the left arm: Continue  local wound care  Hypomagnesemia: Repeat magnesium level tomorrow  Acute blood loss anemia: H&H improved.  Continue to monitor..  S/p transfusion with 1 unit of PRBCs on 03/01/2022.  Probable dysphagia, regurgitation, history of Barrett's esophagus: Aspiration precautions.  Continue Protonix.  Chronic diastolic CHF, peripheral edema: Continue Lasix  Other comorbidities include history of pulmonary embolism on Eliquis, hypercholesterolemia    Diet Order             DIET DYS 3 Room service appropriate? Yes with Assist; Fluid consistency: Thin  Diet effective now                            Consultants: None  Procedures: None    Medications:    amoxicillin-clavulanate  1 tablet Oral Q12H   apixaban  5 mg Oral BID   calcium carbonate  500 mg of elemental calcium Oral BID WC   cholecalciferol  2,000 Units Oral Daily   docusate sodium  100 mg Oral BID   furosemide  20 mg Oral Daily   gabapentin  200 mg Oral QHS   ipratropium-albuterol  3 mL Nebulization BID   metoprolol succinate  25 mg Oral Daily   pantoprazole  40 mg Oral BID   simvastatin  10 mg Oral QHS   Continuous Infusions:     Anti-infectives (From admission, onward)    Start     Dose/Rate Route Frequency Ordered Stop   03/03/22 1230  Ampicillin-Sulbactam (UNASYN) 3 g in sodium chloride 0.9 % 100 mL IVPB  Status:  Discontinued  3 g 200 mL/hr over 30 Minutes Intravenous Every 6 hours 03/03/22 0658 03/03/22 0757   03/03/22 1000  amoxicillin-clavulanate (AUGMENTIN) 875-125 MG per tablet 1 tablet        1 tablet Oral Every 12 hours 03/03/22 0757 03/06/22 0959   02/28/22 1600  Ampicillin-Sulbactam (UNASYN) 3 g in sodium chloride 0.9 % 100 mL IVPB  Status:  Discontinued        3 g 200 mL/hr over 30 Minutes Intravenous Every 12 hours 02/28/22 1552 03/03/22 0658              Family Communication/Anticipated D/C date and plan/Code Status   DVT prophylaxis:  apixaban (ELIQUIS) tablet  5 mg     Code Status: DNR  Family Communication: Plan discussed with Zigmund Daniel, daughter, at the bedside Disposition Plan: Plan to discharge to SNF in 2 days   Status is: Inpatient Remains inpatient appropriate because: IV antibiotics       Subjective:   Interval events noted.  She has no complaints.  "I feel great".  Zigmund Daniel, daughter, was at the bedside  Objective:    Vitals:   03/05/22 0500 03/05/22 0742 03/05/22 0817 03/05/22 0957  BP:  (!) 122/52  (!) 129/57  Pulse:  73  78  Resp:  17    Temp:  98.4 F (36.9 C)    TempSrc:  Axillary    SpO2:  99% 95%   Weight: 74.3 kg     Height:       No data found.   Intake/Output Summary (Last 24 hours) at 03/05/2022 1502 Last data filed at 03/05/2022 3500 Gross per 24 hour  Intake 180 ml  Output 150 ml  Net 30 ml   Filed Weights   03/03/22 0500 03/04/22 0500 03/05/22 0500  Weight: 73 kg 74 kg 74.3 kg    Exam:   GEN: NAD SKIN: Warm and dry.  Multiple bruises on upper and lower extremities.  Skin tear on the left without bleeding. EYES: No pallor or icterus ENT: MMM CV: RRR PULM: Right basilar rales, no wheezing ABD: soft, ND, NT, +BS CNS: AAO x 3, non focal EXT: Edema of bilateral upper and lower extremities   Data Reviewed:   I have personally reviewed following labs and imaging studies:  Labs: Labs show the following:   Basic Metabolic Panel: Recent Labs  Lab 02/28/22 1142 03/01/22 0411 03/02/22 0450 03/03/22 0449 03/04/22 0935  NA 131* 133* 130* 135 136  K 3.5 3.4* 3.4* 3.8 3.6  CL 97* 101 99 104 104  CO2 22 23 21* 24 25  GLUCOSE 98 110* 108* 103* 113*  BUN 28* 25* 24* 22 17  CREATININE 2.41* 1.87* 1.22* 1.06* 0.94  CALCIUM 8.2* 7.8* 8.4* 8.3* 8.9  MG  --   --  1.9 1.7 1.6*  PHOS  --   --  3.8  --   --    GFR Estimated Creatinine Clearance: 36.8 mL/min (by C-G formula based on SCr of 0.94 mg/dL). Liver Function Tests: Recent Labs  Lab 03/02/22 0450 03/03/22 0449  AST  --  14*  ALT  --   11  ALKPHOS  --  43  BILITOT  --  1.0  PROT  --  5.2*  ALBUMIN 2.3* 2.2*   No results for input(s): "LIPASE", "AMYLASE" in the last 168 hours. No results for input(s): "AMMONIA" in the last 168 hours. Coagulation profile No results for input(s): "INR", "PROTIME" in the last 168 hours.  CBC: Recent Labs  Lab 02/28/22 1142 03/01/22 0411 03/02/22 0450 03/03/22 0449 03/04/22 0935 03/05/22 0535  WBC 10.8* 12.8* 10.0 8.2 9.3 7.2  NEUTROABS 7.8*  --   --  6.5  --  5.5  HGB 7.8* 7.5* 8.2* 7.7* 9.7* 7.8*  HCT 24.9* 23.6* 24.9* 23.4* 30.4* 24.5*  MCV 91.9 91.1 88.9 89.0 89.9 90.4  PLT 507* 431* 461* 421* 467* 416*   Cardiac Enzymes: Recent Labs  Lab 03/02/22 0450  CKTOTAL 144   BNP (last 3 results) No results for input(s): "PROBNP" in the last 8760 hours. CBG: Recent Labs  Lab 03/03/22 0733 03/04/22 1229 03/04/22 2047 03/05/22 0743 03/05/22 1135  GLUCAP 99 109* 121* 94 105*   D-Dimer: No results for input(s): "DDIMER" in the last 72 hours. Hgb A1c: No results for input(s): "HGBA1C" in the last 72 hours. Lipid Profile: No results for input(s): "CHOL", "HDL", "LDLCALC", "TRIG", "CHOLHDL", "LDLDIRECT" in the last 72 hours. Thyroid function studies: No results for input(s): "TSH", "T4TOTAL", "T3FREE", "THYROIDAB" in the last 72 hours.  Invalid input(s): "FREET3" Anemia work up: No results for input(s): "VITAMINB12", "FOLATE", "FERRITIN", "TIBC", "IRON", "RETICCTPCT" in the last 72 hours. Sepsis Labs: Recent Labs  Lab 02/28/22 1214 02/28/22 1215 03/01/22 0411 03/02/22 0450 03/03/22 0449 03/04/22 0935 03/05/22 0535  PROCALCITON <0.10  --   --   --   --   --   --   WBC  --   --    < > 10.0 8.2 9.3 7.2  LATICACIDVEN  --  0.9  --   --   --   --   --    < > = values in this interval not displayed.    Microbiology Recent Results (from the past 240 hour(s))  Resp panel by RT-PCR (RSV, Flu A&B, Covid) Anterior Nasal Swab     Status: None   Collection Time:  02/28/22 12:14 PM   Specimen: Anterior Nasal Swab  Result Value Ref Range Status   SARS Coronavirus 2 by RT PCR NEGATIVE NEGATIVE Final    Comment: (NOTE) SARS-CoV-2 target nucleic acids are NOT DETECTED.  The SARS-CoV-2 RNA is generally detectable in upper respiratory specimens during the acute phase of infection. The lowest concentration of SARS-CoV-2 viral copies this assay can detect is 138 copies/mL. A negative result does not preclude SARS-Cov-2 infection and should not be used as the sole basis for treatment or other patient management decisions. A negative result may occur with  improper specimen collection/handling, submission of specimen other than nasopharyngeal swab, presence of viral mutation(s) within the areas targeted by this assay, and inadequate number of viral copies(<138 copies/mL). A negative result must be combined with clinical observations, patient history, and epidemiological information. The expected result is Negative.  Fact Sheet for Patients:  EntrepreneurPulse.com.au  Fact Sheet for Healthcare Providers:  IncredibleEmployment.be  This test is no t yet approved or cleared by the [place] FDA and  has been authorized for detection and/or diagnosis of SARS-CoV-2 by FDA under an Emergency Use Authorization (EUA). This EUA will remain  in effect (meaning this test can be used) for the duration of the COVID-19 declaration under Section 564(b)(1) of the Act, 21 U.S.C.section 360bbb-3(b)(1), unless the authorization is terminated  or revoked sooner.       Influenza A by PCR NEGATIVE NEGATIVE Final   Influenza B by PCR NEGATIVE NEGATIVE Final    Comment: (NOTE) The Xpert Xpress SARS-CoV-2/FLU/RSV plus assay is intended as an aid in the diagnosis of influenza from Nasopharyngeal  swab specimens and should not be used as a sole basis for treatment. Nasal washings and aspirates are unacceptable for Xpert Xpress  SARS-CoV-2/FLU/RSV testing.  Fact Sheet for Patients: EntrepreneurPulse.com.au  Fact Sheet for Healthcare Providers: IncredibleEmployment.be  This test is not yet approved or cleared by the [place] FDA and has been authorized for detection and/or diagnosis of SARS-CoV-2 by FDA under an Emergency Use Authorization (EUA). This EUA will remain in effect (meaning this test can be used) for the duration of the COVID-19 declaration under Section 564(b)(1) of the Act, 21 U.S.C. section 360bbb-3(b)(1), unless the authorization is terminated or revoked.     Resp Syncytial Virus by PCR NEGATIVE NEGATIVE Final    Comment: (NOTE) Fact Sheet for Patients: EntrepreneurPulse.com.au  Fact Sheet for Healthcare Providers: IncredibleEmployment.be  This test is not yet approved or cleared by the [place] FDA and has been authorized for detection and/or diagnosis of SARS-CoV-2 by FDA under an Emergency Use Authorization (EUA). This EUA will remain in effect (meaning this test can be used) for the duration of the COVID-19 declaration under Section 564(b)(1) of the Act, 21 U.S.C. section 360bbb-3(b)(1), unless the authorization is terminated or revoked.  Performed at [facility], [address] [place], [place] [address]   Urine Culture     Status: None   Collection Time: 02/28/22 12:14 PM   Specimen: Urine, Suprapubic  Result Value Ref Range Status   Specimen Description   Final    URINE, SUPRAPUBIC Performed at [facility], [address], [place], [place] [address]    Special Requests   Final    NONE Performed at [facility], [address], [place], [place] [address]    Culture   Final    NO GROWTH Performed at [facility], [place] [address], [place], [place] [address]    Report Status 03/01/2022 FINAL  Final  Culture, blood (routine x 2) Call MD if unable to  obtain prior to antibiotics being given     Status: None   Collection Time: 02/28/22  4:40 PM   Specimen: BLOOD  Result Value Ref Range Status   Specimen Description BLOOD BLOOD LEFT ARM  Final   Special Requests   Final    BOTTLES DRAWN AEROBIC AND ANAEROBIC Blood Culture results may not be optimal due to an inadequate volume of blood received in culture bottles   Culture   Final    NO GROWTH 5 DAYS Performed at [facility], [address], [place], [place] [address]    Report Status 03/05/2022 FINAL  Final  Culture, blood (routine x 2) Call MD if unable to obtain prior to antibiotics being given     Status: None   Collection Time: 02/28/22  4:41 PM   Specimen: BLOOD  Result Value Ref Range Status   Specimen Description BLOOD BLOOD LEFT HAND  Final   Special Requests   Final    BOTTLES DRAWN AEROBIC AND ANAEROBIC Blood Culture adequate volume   Culture   Final    NO GROWTH 5 DAYS Performed at [facility], [address] [place], [place] [address]    Report Status 03/05/2022 FINAL  Final    Procedures and diagnostic studies:  No results found.             LOS: 5 days   Gyselle Matthew  [facility]   Pager on [email or web address]. If 7PM-7AM, please contact night-coverage at [email or web address]     03/05/2022, 3:02  PM

## 2022-03-05 NOTE — Progress Notes (Signed)
Physical Therapy Treatment Patient Details Name: [name] MRN: [id number] DOB: 03/06/1928 Today's Date: 03/05/2022   History of Present Illness 89 y/o female presented to ED on 02/28/22 for AMS and SOB. Recent admission 1/6-1/10 for PE. Now admitted for possible aspiration pneumonia and UTI. PMH: Afib, HTN, hx of PE, CHF, CKD stage 3a, PVD    PT Comments    Pt is making good progress towards goals with improvement noted this session requiring less assist for transfer to chair. Discussed with daughter, still want to pursue SNF at this time. Pt agreeable to sit in recliner. Will continue to progress as able.  Recommendations for follow up therapy are one component of a multi-disciplinary discharge planning process, led by the attending physician.  Recommendations may be updated based on patient status, additional functional criteria and insurance authorization.  Follow Up Recommendations  Skilled nursing-short term rehab (<3 hours/day) Can patient physically be transported by private vehicle: No   Assistance Recommended at Discharge Frequent or constant Supervision/Assistance  Patient can return home with the following Two people to help with walking and/or transfers;Two people to help with bathing/dressing/bathroom;Assistance with cooking/housework;Assistance with feeding;Direct supervision/assist for medications management;Direct supervision/assist for financial management;Assist for transportation;Help with stairs or ramp for entrance   Equipment Recommendations  None recommended by PT    Recommendations for Other Services       Precautions / Restrictions Precautions Precautions: Fall Restrictions Weight Bearing Restrictions: No     Mobility  Bed Mobility Overal bed mobility: Needs Assistance Bed Mobility: Supine to Sit     Supine to sit: Mod assist     General bed mobility comments: safe technique and follows commands well. Pt able to move B LEs across bed 75% of the  way off bed. Once seated at EOB, upright posture and improved posture.    Transfers Overall transfer level: Needs assistance Equipment used: 1 person hand held assist Transfers: Bed to chair/wheelchair/BSC       Squat pivot transfers: Max assist     General transfer comment: improved technique this date, cues for sliding B feet back under her to faciliate improved standing. Once upright, pt able to SPT to recliner. In process, pt had BM.    Ambulation/Gait               General Gait Details: unable   Stairs             Wheelchair Mobility    Modified Rankin (Stroke Patients Only)       Balance Overall balance assessment: Needs assistance Sitting-balance support: Bilateral upper extremity supported, Feet supported Sitting balance-Leahy Scale: Fair     Standing balance support: Bilateral upper extremity supported, During functional activity, Reliant on assistive device for balance Standing balance-Leahy Scale: Poor                              Cognition Arousal/Alertness: Awake/alert Behavior During Therapy: WFL for tasks assessed/performed Overall Cognitive Status: Within Functional Limits for tasks assessed                                 General Comments: agreeable to session        Exercises Other Exercises Other Exercises: once in standing, pt with BM. Needed +2 for prolonged standing and additional person to assist with hygiene.    General Comments  Pertinent Vitals/Pain Pain Assessment Pain Assessment: No/denies pain    Home Living                          Prior Function            PT Goals (current goals can now be found in the care plan section) Acute Rehab PT Goals Patient Stated Goal: daughter's goal is for patient to be able to stand pivot with assistance PT Goal Formulation: With patient/family Time For Goal Achievement: 03/15/22 Potential to Achieve Goals: Fair Progress towards  PT goals: Progressing toward goals    Frequency    Min 2X/week      PT Plan Current plan remains appropriate    Co-evaluation              AM-PAC PT "6 Clicks" Mobility   Outcome Measure  Help needed turning from your back to your side while in a flat bed without using bedrails?: Total Help needed moving from lying on your back to sitting on the side of a flat bed without using bedrails?: Total Help needed moving to and from a bed to a chair (including a wheelchair)?: A Lot Help needed standing up from a chair using your arms (e.g., wheelchair or bedside chair)?: A Lot Help needed to walk in hospital room?: Total Help needed climbing 3-5 steps with a railing? : Total 6 Click Score: 8    End of Session Equipment Utilized During Treatment: Gait belt Activity Tolerance: Patient tolerated treatment well Patient left: in chair;with chair alarm set Nurse Communication: Mobility status PT Visit Diagnosis: Unsteadiness on feet (R26.81);Muscle weakness (generalized) (M62.81);Other abnormalities of gait and mobility (R26.89)     Time: 3662-9476 PT Time Calculation (min) (ACUTE ONLY): 23 min  Charges:  $Therapeutic Activity: 23-37 mins                     Greggory Stallion, PT, DPT, GCS [phone]    [name] 03/05/2022, 2:49 PM

## 2022-03-05 NOTE — Plan of Care (Signed)
  Problem: Clinical Measurements: Goal: Diagnostic test results will improve Outcome: Progressing Goal: Respiratory complications will improve Outcome: Progressing Goal: Cardiovascular complication will be avoided Outcome: Progressing   Problem: Activity: Goal: Risk for activity intolerance will decrease Outcome: Progressing   Problem: Nutrition: Goal: Adequate nutrition will be maintained Outcome: Progressing   Problem: Elimination: Goal: Will not experience complications related to bowel motility Outcome: Progressing Goal: Will not experience complications related to urinary retention Outcome: Progressing

## 2022-03-06 DIAGNOSIS — J690 Pneumonitis due to inhalation of food and vomit: Secondary | ICD-10-CM | POA: Diagnosis not present

## 2022-03-06 LAB — CBC WITH DIFFERENTIAL/PLATELET
Abs Immature Granulocytes: 0.03 10*3/uL (ref 0.00–0.07)
Basophils Absolute: 0.1 10*3/uL (ref 0.0–0.1)
Basophils Relative: 1 %
Eosinophils Absolute: 0.2 10*3/uL (ref 0.0–0.5)
Eosinophils Relative: 4 %
HCT: 24.4 % — ABNORMAL LOW (ref 36.0–46.0)
Hemoglobin: 7.7 g/dL — ABNORMAL LOW (ref 12.0–15.0)
Immature Granulocytes: 1 %
Lymphocytes Relative: 15 %
Lymphs Abs: 0.9 10*3/uL (ref 0.7–4.0)
MCH: 28.9 pg (ref 26.0–34.0)
MCHC: 31.6 g/dL (ref 30.0–36.0)
MCV: 91.7 fL (ref 80.0–100.0)
Monocytes Absolute: 0.4 10*3/uL (ref 0.1–1.0)
Monocytes Relative: 7 %
Neutro Abs: 4.2 10*3/uL (ref 1.7–7.7)
Neutrophils Relative %: 72 %
Platelets: 418 10*3/uL — ABNORMAL HIGH (ref 150–400)
RBC: 2.66 MIL/uL — ABNORMAL LOW (ref 3.87–5.11)
RDW: 15.2 % (ref 11.5–15.5)
WBC: 5.8 10*3/uL (ref 4.0–10.5)
nRBC: 0 % (ref 0.0–0.2)

## 2022-03-06 LAB — GLUCOSE, CAPILLARY
Glucose-Capillary: 83 mg/dL (ref 70–99)
Glucose-Capillary: 116 mg/dL — ABNORMAL HIGH (ref 70–99)

## 2022-03-06 LAB — BASIC METABOLIC PANEL
Anion gap: 6 (ref 5–15)
BUN: 15 mg/dL (ref 8–23)
CO2: 24 mmol/L (ref 22–32)
Calcium: 8.3 mg/dL — ABNORMAL LOW (ref 8.9–10.3)
Chloride: 106 mmol/L (ref 98–111)
Creatinine, Ser: 0.92 mg/dL (ref 0.44–1.00)
GFR, Estimated: 59 mL/min — ABNORMAL LOW (ref >60)
Glucose, Bld: 94 mg/dL (ref 70–99)
Potassium: 3.3 mmol/L — ABNORMAL LOW (ref 3.5–5.1)
Sodium: 136 mmol/L (ref 135–145)

## 2022-03-06 LAB — MAGNESIUM: Magnesium: 2 mg/dL (ref 1.7–2.4)

## 2022-03-06 MED ORDER — POTASSIUM CHLORIDE CRYS ER 20 MEQ PO TBCR
40.0000 meq | EXTENDED_RELEASE_TABLET | Freq: Once | ORAL | Status: AC
  Administered 2022-03-06: 40 meq via ORAL
  Filled 2022-03-06: qty 2

## 2022-03-06 NOTE — TOC Progression Note (Signed)
Transition of Care [facility]) - Progression Note    Patient Details  Name: [name] MRN: [id number] Date of Birth: 02/06/1929  [facility] CM/SW Contact  Izola Price, RN Phone Number: 03/06/2022, 5:01 PM  Clinical Narrative:  03/06/22: Authorization for SNF [facility] submitted via n[facility]/Release. Simmie Davies RN CM      Expected Discharge Plan: Skilled Nursing Facility Barriers to Discharge: Continued Medical Work up  Expected Discharge Plan and Services   Discharge Planning Services: CM Consult Post Acute Care Choice: [place] Living arrangements for the past 2 months: Single Family Home                 DME Arranged: N/A DME Agency: NA                   Social Determinants of Health (SDOH) Interventions SDOH Screenings   Food Insecurity: No Food Insecurity (03/01/2022)  Housing: Low Risk  (03/01/2022)  Transportation Needs: No Transportation Needs (03/01/2022)  Utilities: Not At Risk (03/01/2022)  Depression (PHQ2-9): Low Risk  (11/30/2020)  Financial Resource Strain: Low Risk  (11/30/2020)  Physical Activity: Unknown (11/30/2020)  Social Connections: Unknown (11/30/2020)  Stress: No Stress Concern Present (11/30/2020)  Tobacco Use: Low Risk  (03/01/2022)    Readmission Risk Interventions    03/02/2022   12:47 PM 02/21/2022    9:54 AM  Readmission Risk Prevention Plan  Transportation Screening Complete Complete  Medication Review (RN Care Manager) Complete   PCP or Specialist appointment within 3-5 days of discharge Complete   HRI or [place] Complete Complete  SW Recovery Care/Counseling Consult Complete   Palliative Care Screening Complete Complete  Skilled Nursing Facility Complete

## 2022-03-06 NOTE — Progress Notes (Signed)
Progress Note    [name]  OZH:[id number] DOB: August 18, 1928  DOA: 02/28/2022 PCP: [name], MD      Brief Narrative:    Medical records reviewed and are as summarized below:  [name] is a 89 y.o. female  with PMH of chronic diastolic CHF, A-fib and PE on Eliquis, HTN, HLD, hereditary hemochromatosis, colon cancer s/p surgical resection and chemo, GERD, ambulatory dysfunction, incontinence, recent hospitalization from 1/6 through 02/23/2022 for influenza B infection and bacterial pneumonia.  She presented to the hospital with altered mental status, productive cough and shortness of breath.  Reportedly, oxygen saturation was in the 70s on room air when EMS arrived.  She was placed on 100% oxygen via nonrebreather mask and brought to the emergency department for further management.   She was admitted to the hospital for aspiration pneumonia, acute metabolic encephalopathy, AKI.     Assessment/Plan:   Principal Problem:   Aspiration pneumonia ([place]) Active Problems:   Blood loss anemia   Acute metabolic encephalopathy   Paroxysmal atrial fibrillation (HCC)   Pulmonary embolism (HCC)   Essential hypertension   Chronic diastolic CHF (congestive heart failure) (HCC)   Hypercholesteremia   Restless legs syndrome   Normocytic anemia   Acute renal failure superimposed on stage 3a chronic kidney disease ([place])   DNR (do not resuscitate)    Body mass index is 29.96 kg/m.  Aspiration pneumonia: COVID-19, influenza and RSV tests were negative.  No growth on blood cultures.  Completed IV Unasyn followed by Augmentin on 03/05/2022.  Acute metabolic encephalopathy: Back to baseline.  Continue supportive care.  CT head did not show any acute abnormality.  AKI on CKD stage IIIa: Creatinine is improved to baseline.   Acute hypoxic respiratory failure: Improved.  She is tolerating room air.  Paroxysmal atrial fibrillation: Continue metoprolol and Eliquis  Skin tear  on the left arm: Continue local wound care  Hypomagnesemia: Improved  Hypokalemia: Replete potassium  Acute blood loss anemia: Hemoglobin is slowly trending down, from 9.7-7.7.  She is asymptomatic.  Continue to monitor..  S/p transfusion with 1 unit of PRBCs on 03/01/2022.  Probable dysphagia, regurgitation, history of Barrett's esophagus: Aspiration precautions.  Continue Protonix.  Chronic diastolic CHF, peripheral edema: Continue Lasix  Other comorbidities include history of pulmonary embolism on Eliquis, hypercholesterolemia    Diet Order             DIET DYS 3 Room service appropriate? Yes with Assist; Fluid consistency: Thin  Diet effective now                            Consultants: None  Procedures: None    Medications:    apixaban  5 mg Oral BID   calcium carbonate  500 mg of elemental calcium Oral BID WC   cholecalciferol  2,000 Units Oral Daily   docusate sodium  100 mg Oral BID   furosemide  20 mg Oral Daily   gabapentin  200 mg Oral QHS   ipratropium-albuterol  3 mL Nebulization BID   metoprolol succinate  25 mg Oral Daily   pantoprazole  40 mg Oral BID   simvastatin  10 mg Oral QHS   Continuous Infusions:     Anti-infectives (From admission, onward)    Start     Dose/Rate Route Frequency Ordered Stop   03/03/22 1230  Ampicillin-Sulbactam (UNASYN) 3 g in sodium chloride 0.9 % 100 mL IVPB  Status:  Discontinued        3 g 200 mL/hr over 30 Minutes Intravenous Every 6 hours 03/03/22 0658 03/03/22 0757   03/03/22 1000  amoxicillin-clavulanate (AUGMENTIN) 875-125 MG per tablet 1 tablet        1 tablet Oral Every 12 hours 03/03/22 0757 03/05/22 2241   02/28/22 1600  Ampicillin-Sulbactam (UNASYN) 3 g in sodium chloride 0.9 % 100 mL IVPB  Status:  Discontinued        3 g 200 mL/hr over 30 Minutes Intravenous Every 12 hours 02/28/22 1552 03/03/22 0658              Family Communication/Anticipated D/C date and plan/Code Status    DVT prophylaxis:  apixaban (ELIQUIS) tablet 5 mg     Code Status: DNR  Family Communication: None Disposition Plan: Plan to discharge to SNF in 1 to 2 days   Status is: Inpatient Remains inpatient appropriate because: Awaiting placement to SNF       Subjective:   Interval events noted.  She has no complaints.  She feels well.  Objective:    Vitals:   03/05/22 1536 03/05/22 2315 03/06/22 0823 03/06/22 0911  BP: (!) 142/68 (!) 124/51  (!) 147/51  Pulse: 72 67  89  Resp: '16 16  16  '$ Temp: 97.9 F (36.6 C) 98 F (36.7 C)  98.2 F (36.8 C)  TempSrc:      SpO2: 99% 97% 96% 95%  Weight:      Height:       No data found.   Intake/Output Summary (Last 24 hours) at 03/06/2022 1544 Last data filed at 03/06/2022 1147 Gross per 24 hour  Intake --  Output 1350 ml  Net -1350 ml   Filed Weights   03/03/22 0500 03/04/22 0500 03/05/22 0500  Weight: 73 kg 74 kg 74.3 kg    Exam:  GEN: NAD SKIN: Bruises on upper and lower extremities, skin tear on left arm EYES: Anicteric ENT: MMM CV: RRR PULM: Right basilar rales, no wheezing ABD: soft, ND, NT, +BS CNS: AAO x 3, non focal EXT: Bilateral upper and lower extremity edema, no tenderness    Data Reviewed:   I have personally reviewed following labs and imaging studies:  Labs: Labs show the following:   Basic Metabolic Panel: Recent Labs  Lab 03/01/22 0411 03/02/22 0450 03/03/22 0449 03/04/22 0935 03/06/22 0534  NA 133* 130* 135 136 136  K 3.4* 3.4* 3.8 3.6 3.3*  CL 101 99 104 104 106  CO2 23 21* '24 25 24  '$ GLUCOSE 110* 108* 103* 113* 94  BUN 25* 24* '22 17 15  '$ CREATININE 1.87* 1.22* 1.06* 0.94 0.92  CALCIUM 7.8* 8.4* 8.3* 8.9 8.3*  MG  --  1.9 1.7 1.6* 2.0  PHOS  --  3.8  --   --   --    GFR Estimated Creatinine Clearance: 37.6 mL/min (by C-G formula based on SCr of 0.92 mg/dL). Liver Function Tests: Recent Labs  Lab 03/02/22 0450 03/03/22 0449  AST  --  14*  ALT  --  11  ALKPHOS  --  43   BILITOT  --  1.0  PROT  --  5.2*  ALBUMIN 2.3* 2.2*   No results for input(s): "LIPASE", "AMYLASE" in the last 168 hours. No results for input(s): "AMMONIA" in the last 168 hours. Coagulation profile No results for input(s): "INR", "PROTIME" in the last 168 hours.  CBC: Recent Labs  Lab 02/28/22 1142 03/01/22 0411 03/02/22  1941 03/03/22 0449 03/04/22 0935 03/05/22 0535 03/06/22 0534  WBC 10.8*   < > 10.0 8.2 9.3 7.2 5.8  NEUTROABS 7.8*  --   --  6.5  --  5.5 4.2  HGB 7.8*   < > 8.2* 7.7* 9.7* 7.8* 7.7*  HCT 24.9*   < > 24.9* 23.4* 30.4* 24.5* 24.4*  MCV 91.9   < > 88.9 89.0 89.9 90.4 91.7  PLT 507*   < > 461* 421* 467* 416* 418*   < > = values in this interval not displayed.   Cardiac Enzymes: Recent Labs  Lab 03/02/22 0450  CKTOTAL 144   BNP (last 3 results) No results for input(s): "PROBNP" in the last 8760 hours. CBG: Recent Labs  Lab 03/04/22 2047 03/05/22 0743 03/05/22 1135 03/05/22 1645 03/06/22 0759  GLUCAP 121* 94 105* 100* 83   D-Dimer: No results for input(s): "DDIMER" in the last 72 hours. Hgb A1c: No results for input(s): "HGBA1C" in the last 72 hours. Lipid Profile: No results for input(s): "CHOL", "HDL", "LDLCALC", "TRIG", "CHOLHDL", "LDLDIRECT" in the last 72 hours. Thyroid function studies: No results for input(s): "TSH", "T4TOTAL", "T3FREE", "THYROIDAB" in the last 72 hours.  Invalid input(s): "FREET3" Anemia work up: No results for input(s): "VITAMINB12", "FOLATE", "FERRITIN", "TIBC", "IRON", "RETICCTPCT" in the last 72 hours. Sepsis Labs: Recent Labs  Lab 02/28/22 1214 02/28/22 1215 03/01/22 0411 03/03/22 0449 03/04/22 0935 03/05/22 0535 03/06/22 0534  PROCALCITON <0.10  --   --   --   --   --   --   WBC  --   --    < > 8.2 9.3 7.2 5.8  LATICACIDVEN  --  0.9  --   --   --   --   --    < > = values in this interval not displayed.    Microbiology Recent Results (from the past 240 hour(s))  Resp panel by RT-PCR (RSV, Flu  A&B, Covid) Anterior Nasal Swab     Status: None   Collection Time: 02/28/22 12:14 PM   Specimen: Anterior Nasal Swab  Result Value Ref Range Status   SARS Coronavirus 2 by RT PCR NEGATIVE NEGATIVE Final    Comment: (NOTE) SARS-CoV-2 target nucleic acids are NOT DETECTED.  The SARS-CoV-2 RNA is generally detectable in upper respiratory specimens during the acute phase of infection. The lowest concentration of SARS-CoV-2 viral copies this assay can detect is 138 copies/mL. A negative result does not preclude SARS-Cov-2 infection and should not be used as the sole basis for treatment or other patient management decisions. A negative result may occur with  improper specimen collection/handling, submission of specimen other than nasopharyngeal swab, presence of viral mutation(s) within the areas targeted by this assay, and inadequate number of viral copies(<138 copies/mL). A negative result must be combined with clinical observations, patient history, and epidemiological information. The expected result is Negative.  Fact Sheet for Patients:  EntrepreneurPulse.com.au  Fact Sheet for Healthcare Providers:  IncredibleEmployment.be  This test is no t yet approved or cleared by the [place] FDA and  has been authorized for detection and/or diagnosis of SARS-CoV-2 by FDA under an Emergency Use Authorization (EUA). This EUA will remain  in effect (meaning this test can be used) for the duration of the COVID-19 declaration under Section 564(b)(1) of the Act, 21 U.S.C.section 360bbb-3(b)(1), unless the authorization is terminated  or revoked sooner.       Influenza A by PCR NEGATIVE NEGATIVE Final   Influenza B  by PCR NEGATIVE NEGATIVE Final    Comment: (NOTE) The Xpert Xpress SARS-CoV-2/FLU/RSV plus assay is intended as an aid in the diagnosis of influenza from Nasopharyngeal swab specimens and should not be used as a sole basis for treatment.  Nasal washings and aspirates are unacceptable for Xpert Xpress SARS-CoV-2/FLU/RSV testing.  Fact Sheet for Patients: EntrepreneurPulse.com.au  Fact Sheet for Healthcare Providers: IncredibleEmployment.be  This test is not yet approved or cleared by the [place] FDA and has been authorized for detection and/or diagnosis of SARS-CoV-2 by FDA under an Emergency Use Authorization (EUA). This EUA will remain in effect (meaning this test can be used) for the duration of the COVID-19 declaration under Section 564(b)(1) of the Act, 21 U.S.C. section 360bbb-3(b)(1), unless the authorization is terminated or revoked.     Resp Syncytial Virus by PCR NEGATIVE NEGATIVE Final    Comment: (NOTE) Fact Sheet for Patients: EntrepreneurPulse.com.au  Fact Sheet for Healthcare Providers: IncredibleEmployment.be  This test is not yet approved or cleared by the [place] FDA and has been authorized for detection and/or diagnosis of SARS-CoV-2 by FDA under an Emergency Use Authorization (EUA). This EUA will remain in effect (meaning this test can be used) for the duration of the COVID-19 declaration under Section 564(b)(1) of the Act, 21 U.S.C. section 360bbb-3(b)(1), unless the authorization is terminated or revoked.  Performed at [facility], [address] [place], [place] [address]   Urine Culture     Status: None   Collection Time: 02/28/22 12:14 PM   Specimen: Urine, Suprapubic  Result Value Ref Range Status   Specimen Description   Final    URINE, SUPRAPUBIC Performed at [facility], [address], [place], [place] [address]    Special Requests   Final    NONE Performed at [facility], [address], [place], [place] [address]    Culture   Final    NO GROWTH Performed at [facility], [place] [address], [place], [place] [address]    Report Status 03/01/2022 FINAL   Final  Culture, blood (routine x 2) Call MD if unable to obtain prior to antibiotics being given     Status: None   Collection Time: 02/28/22  4:40 PM   Specimen: BLOOD  Result Value Ref Range Status   Specimen Description BLOOD BLOOD LEFT ARM  Final   Special Requests   Final    BOTTLES DRAWN AEROBIC AND ANAEROBIC Blood Culture results may not be optimal due to an inadequate volume of blood received in culture bottles   Culture   Final    NO GROWTH 5 DAYS Performed at [facility], [address], [place], [place] [address]    Report Status 03/05/2022 FINAL  Final  Culture, blood (routine x 2) Call MD if unable to obtain prior to antibiotics being given     Status: None   Collection Time: 02/28/22  4:41 PM   Specimen: BLOOD  Result Value Ref Range Status   Specimen Description BLOOD BLOOD LEFT HAND  Final   Special Requests   Final    BOTTLES DRAWN AEROBIC AND ANAEROBIC Blood Culture adequate volume   Culture   Final    NO GROWTH 5 DAYS Performed at [facility], [address], [place], [place] [address]    Report Status 03/05/2022 FINAL  Final    Procedures and diagnostic studies:  No results found.  LOS: 6 days   Allona Gondek  [facility]   Pager on [email or web address]. If 7PM-7AM, please contact night-coverage at [email or web address]     03/06/2022, 3:44 PM

## 2022-03-06 NOTE — Plan of Care (Signed)
  Problem: Clinical Measurements: Goal: Diagnostic test results will improve Outcome: Progressing Goal: Respiratory complications will improve Outcome: Progressing Goal: Cardiovascular complication will be avoided Outcome: Progressing   Problem: Activity: Goal: Risk for activity intolerance will decrease Outcome: Progressing   Problem: Nutrition: Goal: Adequate nutrition will be maintained Outcome: Progressing   Problem: Elimination: Goal: Will not experience complications related to bowel motility Outcome: Progressing Goal: Will not experience complications related to urinary retention Outcome: Progressing   Problem: Pain Managment: Goal: General experience of comfort will improve Outcome: Progressing

## 2022-03-07 DIAGNOSIS — I480 Paroxysmal atrial fibrillation: Secondary | ICD-10-CM | POA: Diagnosis not present

## 2022-03-07 DIAGNOSIS — I5032 Chronic diastolic (congestive) heart failure: Secondary | ICD-10-CM | POA: Diagnosis not present

## 2022-03-07 DIAGNOSIS — J690 Pneumonitis due to inhalation of food and vomit: Secondary | ICD-10-CM | POA: Diagnosis not present

## 2022-03-07 LAB — HEMOGLOBIN AND HEMATOCRIT, BLOOD
HCT: 25.5 % — ABNORMAL LOW (ref 36.0–46.0)
Hemoglobin: 8.1 g/dL — ABNORMAL LOW (ref 12.0–15.0)

## 2022-03-07 LAB — POTASSIUM: Potassium: 3.3 mmol/L — ABNORMAL LOW (ref 3.5–5.1)

## 2022-03-07 LAB — GLUCOSE, CAPILLARY: Glucose-Capillary: 87 mg/dL (ref 70–99)

## 2022-03-07 MED ORDER — POTASSIUM CHLORIDE CRYS ER 20 MEQ PO TBCR
20.0000 meq | EXTENDED_RELEASE_TABLET | Freq: Every day | ORAL | Status: DC
  Administered 2022-03-07 – 2022-03-12 (×6): 20 meq via ORAL
  Filled 2022-03-07 (×6): qty 1

## 2022-03-07 NOTE — Progress Notes (Signed)
Contacted by patient's daughter by telephone. Stated she was concerned the heat was not working in her mother's room and was afraid it would make her sicker. Explained we could not work on the heat while the patient was in the room, and as soon as another room was available we would move her into a different room. Daughter expressed how unprofessional this was and was upset there was not a plan in place. Contacted pt placement to move patient to a different department per daughters request. Kathrin Greathouse RN Dept AD, spoke with family.

## 2022-03-07 NOTE — TOC Progression Note (Addendum)
Transition of Care [facility]) - Progression Note    Patient Details  Name: [name] MRN: [id number] Date of Birth: Feb 16, 1928  [facility]) CM/SW [place], RN Phone Number: 03/07/2022, 3:25 PM  Clinical Narrative:     [facility] called and provided the denial, the patient can appeal by calling [phone]  I notified the family, and spoke to [name] at [phone] The plan to take her to [facility] and pay out of [place]  She is going to appeal the denial  Will wait to DC until hear back from the plan   Expected Discharge Plan: [place] Barriers to Discharge: Continued Medical Work up  Expected Discharge Plan and Services   Discharge Planning Services: CM Consult Post Acute Care Choice: [place] arrangements for the past 2 months: Single Family Home                 DME Arranged: N/A DME Agency: NA                   Social Determinants of Health (SDOH) Interventions SDOH Screenings   Food Insecurity: No Food Insecurity (03/01/2022)  Housing: Low Risk  (03/01/2022)  Transportation Needs: No Transportation Needs (03/01/2022)  Utilities: Not At Risk (03/01/2022)  Depression (PHQ2-9): Low Risk  (11/30/2020)  Financial Resource Strain: Low Risk  (11/30/2020)  Physical Activity: Unknown (11/30/2020)  Social Connections: Unknown (11/30/2020)  Stress: No Stress Concern Present (11/30/2020)  Tobacco Use: Low Risk  (03/01/2022)    Readmission Risk Interventions    03/02/2022   12:47 PM 02/21/2022    9:54 AM  Readmission Risk Prevention Plan  Transportation Screening Complete Complete  Medication Review Press photographer) Complete   PCP or Specialist appointment within 3-5 days of discharge Complete   HRI or [place] Complete Complete  SW Recovery Care/Counseling Consult Complete   Palliative Care Screening Complete Complete  Skilled Nursing Facility Complete

## 2022-03-07 NOTE — Progress Notes (Signed)
OT Cancellation Note  Patient Details Name: [name] MRN: [id number] DOB: 01-03-1929   Cancelled Treatment:    Reason Eval/Treat Not Completed: Patient declined, no reason specified. Upon entering room, pt resting in bed and daughter voicing frustrations with heat not working in pt's room. Notified unit secretary who placed maintenance request. Pt/daughter deferred therapy at this time and asked that OT come back later today. Will re-attempt as able.   Doneta Public 03/07/2022, 9:50 AM

## 2022-03-07 NOTE — Care Management Important Message (Signed)
Important Message  Patient Details  Name: [name] MRN: [id number] Date of Birth: 1928-06-08   Medicare Important Message Given:  Yes     Juliann Pulse A Kayzen Kendzierski 03/07/2022, 12:42 PM

## 2022-03-07 NOTE — Progress Notes (Addendum)
Progress Note    [name]  ZOX:[id number] DOB: [birth year]  DOA: 02/28/2022 PCP: [name], MD      Brief Narrative:    Medical records reviewed and are as summarized below:  [name] is a 89 y.o. female  with PMH of chronic diastolic CHF, A-fib and PE on Eliquis, HTN, HLD, hereditary hemochromatosis, colon cancer s/p surgical resection and chemo, GERD, ambulatory dysfunction, incontinence, recent hospitalization from 1/6 through 02/23/2022 for influenza B infection and bacterial pneumonia.  She presented to the hospital with altered mental status, productive cough and shortness of breath.  Reportedly, oxygen saturation was in the 70s on room air when EMS arrived.  She was placed on 100% oxygen via nonrebreather mask and brought to the emergency department for further management.   She was admitted to the hospital for aspiration pneumonia, acute metabolic encephalopathy, AKI.     Assessment/Plan:   Principal Problem:   Aspiration pneumonia ([place]) Active Problems:   Blood loss anemia   Acute metabolic encephalopathy   Paroxysmal atrial fibrillation (HCC)   Pulmonary embolism (HCC)   Essential hypertension   Chronic diastolic CHF (congestive heart failure) (HCC)   Hypercholesteremia   Restless legs syndrome   Normocytic anemia   Acute renal failure superimposed on stage 3a chronic kidney disease ([place])   DNR (do not resuscitate)    Body mass index is 29.96 kg/m.  Aspiration pneumonia: COVID-19, influenza and RSV tests were negative.  No growth on blood cultures.  Completed IV Unasyn followed by Augmentin on 03/05/2022.  Acute metabolic encephalopathy: Back to baseline.  Continue supportive care.  CT head did not show any acute abnormality.  AKI on CKD stage IIIa: Creatinine is improved to baseline.   Acute hypoxic respiratory failure: Improved.  She is tolerating room air.  Paroxysmal atrial fibrillation: Continue metoprolol and Eliquis  Skin tear  on the left arm: Continue local wound care  Hypomagnesemia: Improved  Hypokalemia: Continue potassium repletion  Acute blood loss anemia: H&H is stable.  Hemoglobin is 8.1.  She is asymptomatic. S/p transfusion with 1 unit of PRBCs on 03/01/2022.  Probable dysphagia, regurgitation, history of Barrett's esophagus: Aspiration precautions.  Continue Protonix.  Chronic diastolic CHF, peripheral edema: Continue Lasix  Other comorbidities include history of pulmonary embolism on Eliquis, hypercholesterolemia  Insurance authorization for SNF was denied.  I spoke to Dr. Rosana Hoes, Visual merchandiser, for peer to peer review.  She upheld the denial.  [name], daughter, has been updated.  She is planning to pay out-of-pocket for her mother to go to SNF.  She has also decided to appeal the denial.    Diet Order             DIET DYS 3 Room service appropriate? Yes with Assist; Fluid consistency: Thin  Diet effective now                            Consultants: None  Procedures: None    Medications:    apixaban  5 mg Oral BID   calcium carbonate  500 mg of elemental calcium Oral BID WC   cholecalciferol  2,000 Units Oral Daily   docusate sodium  100 mg Oral BID   furosemide  20 mg Oral Daily   gabapentin  200 mg Oral QHS   metoprolol succinate  25 mg Oral Daily   pantoprazole  40 mg Oral BID   potassium chloride  20  mEq Oral Daily   simvastatin  10 mg Oral QHS   Continuous Infusions:     Anti-infectives (From admission, onward)    Start     Dose/Rate Route Frequency Ordered Stop   03/03/22 1230  Ampicillin-Sulbactam (UNASYN) 3 g in sodium chloride 0.9 % 100 mL IVPB  Status:  Discontinued        3 g 200 mL/hr over 30 Minutes Intravenous Every 6 hours 03/03/22 0658 03/03/22 0757   03/03/22 1000  amoxicillin-clavulanate (AUGMENTIN) 875-125 MG per tablet 1 tablet        1 tablet Oral Every 12 hours 03/03/22 0757 03/05/22 2241   02/28/22 1600   Ampicillin-Sulbactam (UNASYN) 3 g in sodium chloride 0.9 % 100 mL IVPB  Status:  Discontinued        3 g 200 mL/hr over 30 Minutes Intravenous Every 12 hours 02/28/22 1552 03/03/22 0658              Family Communication/Anticipated D/C date and plan/Code Status   DVT prophylaxis:  apixaban (ELIQUIS) tablet 5 mg     Code Status: DNR  Family Communication: [name], daughter, at the bedside Disposition Plan: Plan to discharge to SNF in 1 to 2 days   Status is: Inpatient Remains inpatient appropriate because: Awaiting placement to SNF       Subjective:   Interval events noted.  No shortness of breath or chest pain.  She has no complaints.  [name], daughter, was at the bedside  Objective:    Vitals:   03/06/22 1702 03/06/22 2331 03/07/22 0855 03/07/22 1635  BP: (!) 143/59 (!) 151/67 (!) 148/54 (!) 163/72  Pulse: 79 72 90 82  Resp: 16 (!) '22 18 18  '$ Temp: 99.4 F (37.4 C) 98.6 F (37 C) 98.3 F (36.8 C) 98.1 F (36.7 C)  TempSrc:      SpO2: 98% 96% 96% 95%  Weight:      Height:       No data found.   Intake/Output Summary (Last 24 hours) at 03/07/2022 1637 Last data filed at 03/07/2022 0800 Gross per 24 hour  Intake --  Output 400 ml  Net -400 ml   Filed Weights   03/03/22 0500 03/04/22 0500 03/05/22 0500  Weight: 73 kg 74 kg 74.3 kg    Exam:  GEN: NAD SKIN: Skin is very fragile.  Bruises on upper and lower extremities.  Skin tear on left arm. EYES: No pallor or icterus ENT: MMM CV: RRR PULM: CTA B ABD: soft, ND, NT, +BS CNS: AAO x 3, non focal EXT: Edema of bilateral upper and lower extremities     Data Reviewed:   I have personally reviewed following labs and imaging studies:  Labs: Labs show the following:   Basic Metabolic Panel: Recent Labs  Lab 03/01/22 0411 03/02/22 0450 03/03/22 0449 03/04/22 0935 03/06/22 0534 03/07/22 0441  NA 133* 130* 135 136 136  --   K 3.4* 3.4* 3.8 3.6 3.3* 3.3*  CL 101 99 104 104 106  --   CO2 23  21* '24 25 24  '$ --   GLUCOSE 110* 108* 103* 113* 94  --   BUN 25* 24* '22 17 15  '$ --   CREATININE 1.87* 1.22* 1.06* 0.94 0.92  --   CALCIUM 7.8* 8.4* 8.3* 8.9 8.3*  --   MG  --  1.9 1.7 1.6* 2.0  --   PHOS  --  3.8  --   --   --   --  GFR Estimated Creatinine Clearance: 37.6 mL/min (by C-G formula based on SCr of 0.92 mg/dL). Liver Function Tests: Recent Labs  Lab 03/02/22 0450 03/03/22 0449  AST  --  14*  ALT  --  11  ALKPHOS  --  43  BILITOT  --  1.0  PROT  --  5.2*  ALBUMIN 2.3* 2.2*   No results for input(s): "LIPASE", "AMYLASE" in the last 168 hours. No results for input(s): "AMMONIA" in the last 168 hours. Coagulation profile No results for input(s): "INR", "PROTIME" in the last 168 hours.  CBC: Recent Labs  Lab 03/02/22 0450 03/03/22 0449 03/04/22 0935 03/05/22 0535 03/06/22 0534 03/07/22 0441  WBC 10.0 8.2 9.3 7.2 5.8  --   NEUTROABS  --  6.5  --  5.5 4.2  --   HGB 8.2* 7.7* 9.7* 7.8* 7.7* 8.1*  HCT 24.9* 23.4* 30.4* 24.5* 24.4* 25.5*  MCV 88.9 89.0 89.9 90.4 91.7  --   PLT 461* 421* 467* 416* 418*  --    Cardiac Enzymes: Recent Labs  Lab 03/02/22 0450  CKTOTAL 144   BNP (last 3 results) No results for input(s): "PROBNP" in the last 8760 hours. CBG: Recent Labs  Lab 03/05/22 1135 03/05/22 1645 03/06/22 0759 03/06/22 1623 03/07/22 0848  GLUCAP 105* 100* 83 116* 87   D-Dimer: No results for input(s): "DDIMER" in the last 72 hours. Hgb A1c: No results for input(s): "HGBA1C" in the last 72 hours. Lipid Profile: No results for input(s): "CHOL", "HDL", "LDLCALC", "TRIG", "CHOLHDL", "LDLDIRECT" in the last 72 hours. Thyroid function studies: No results for input(s): "TSH", "T4TOTAL", "T3FREE", "THYROIDAB" in the last 72 hours.  Invalid input(s): "FREET3" Anemia work up: No results for input(s): "VITAMINB12", "FOLATE", "FERRITIN", "TIBC", "IRON", "RETICCTPCT" in the last 72 hours. Sepsis Labs: Recent Labs  Lab 03/03/22 0449 03/04/22 0935  03/05/22 0535 03/06/22 0534  WBC 8.2 9.3 7.2 5.8    Microbiology Recent Results (from the past 240 hour(s))  Resp panel by RT-PCR (RSV, Flu A&B, Covid) Anterior Nasal Swab     Status: None   Collection Time: 02/28/22 12:14 PM   Specimen: Anterior Nasal Swab  Result Value Ref Range Status   SARS Coronavirus 2 by RT PCR NEGATIVE NEGATIVE Final    Comment: (NOTE) SARS-CoV-2 target nucleic acids are NOT DETECTED.  The SARS-CoV-2 RNA is generally detectable in upper respiratory specimens during the acute phase of infection. The lowest concentration of SARS-CoV-2 viral copies this assay can detect is 138 copies/mL. A negative result does not preclude SARS-Cov-2 infection and should not be used as the sole basis for treatment or other patient management decisions. A negative result may occur with  improper specimen collection/handling, submission of specimen other than nasopharyngeal swab, presence of viral mutation(s) within the areas targeted by this assay, and inadequate number of viral copies(<138 copies/mL). A negative result must be combined with clinical observations, patient history, and epidemiological information. The expected result is Negative.  Fact Sheet for Patients:  EntrepreneurPulse.com.au  Fact Sheet for Healthcare Providers:  IncredibleEmployment.be  This test is no t yet approved or cleared by the [place] FDA and  has been authorized for detection and/or diagnosis of SARS-CoV-2 by FDA under an Emergency Use Authorization (EUA). This EUA will remain  in effect (meaning this test can be used) for the duration of the COVID-19 declaration under Section 564(b)(1) of the Act, 21 U.S.C.section 360bbb-3(b)(1), unless the authorization is terminated  or revoked sooner.       Influenza  A by PCR NEGATIVE NEGATIVE Final   Influenza B by PCR NEGATIVE NEGATIVE Final    Comment: (NOTE) The Xpert Xpress SARS-CoV-2/FLU/RSV plus  assay is intended as an aid in the diagnosis of influenza from Nasopharyngeal swab specimens and should not be used as a sole basis for treatment. Nasal washings and aspirates are unacceptable for Xpert Xpress SARS-CoV-2/FLU/RSV testing.  Fact Sheet for Patients: EntrepreneurPulse.com.au  Fact Sheet for Healthcare Providers: IncredibleEmployment.be  This test is not yet approved or cleared by the [place] FDA and has been authorized for detection and/or diagnosis of SARS-CoV-2 by FDA under an Emergency Use Authorization (EUA). This EUA will remain in effect (meaning this test can be used) for the duration of the COVID-19 declaration under Section 564(b)(1) of the Act, 21 U.S.C. section 360bbb-3(b)(1), unless the authorization is terminated or revoked.     Resp Syncytial Virus by PCR NEGATIVE NEGATIVE Final    Comment: (NOTE) Fact Sheet for Patients: EntrepreneurPulse.com.au  Fact Sheet for Healthcare Providers: IncredibleEmployment.be  This test is not yet approved or cleared by the [place] FDA and has been authorized for detection and/or diagnosis of SARS-CoV-2 by FDA under an Emergency Use Authorization (EUA). This EUA will remain in effect (meaning this test can be used) for the duration of the COVID-19 declaration under Section 564(b)(1) of the Act, 21 U.S.C. section 360bbb-3(b)(1), unless the authorization is terminated or revoked.  Performed at [facility], [address], [place], [place] [address]   Urine Culture     Status: None   Collection Time: 02/28/22 12:14 PM   Specimen: Urine, Suprapubic  Result Value Ref Range Status   Specimen Description   Final    URINE, SUPRAPUBIC Performed at [facility], [address], [place], [place] [address]    Special Requests   Final    NONE Performed at [facility], [address], [place], [place]  [address]    Culture   Final    NO GROWTH Performed at [facility], [place] [address] [place], [place] [address]    Report Status 03/01/2022 FINAL  Final  Culture, blood (routine x 2) Call MD if unable to obtain prior to antibiotics being given     Status: None   Collection Time: 02/28/22  4:40 PM   Specimen: BLOOD  Result Value Ref Range Status   Specimen Description BLOOD BLOOD LEFT ARM  Final   Special Requests   Final    BOTTLES DRAWN AEROBIC AND ANAEROBIC Blood Culture results may not be optimal due to an inadequate volume of blood received in culture bottles   Culture   Final    NO GROWTH 5 DAYS Performed at [facility], [address], [place], [place] [address]    Report Status 03/05/2022 FINAL  Final  Culture, blood (routine x 2) Call MD if unable to obtain prior to antibiotics being given     Status: None   Collection Time: 02/28/22  4:41 PM   Specimen: BLOOD  Result Value Ref Range Status   Specimen Description BLOOD BLOOD LEFT HAND  Final   Special Requests   Final    BOTTLES DRAWN AEROBIC AND ANAEROBIC Blood Culture adequate volume   Culture   Final    NO GROWTH 5 DAYS Performed at [facility], [address], [place], [place] [address]    Report Status 03/05/2022 FINAL  Final    Procedures and diagnostic studies:  No results found.  LOS: 7 days   Cartha Rotert  [facility]   Pager on [email or web address]. If 7PM-7AM, please contact night-coverage at [email or web address]     03/07/2022, 4:37 PM

## 2022-03-07 NOTE — Progress Notes (Addendum)
Physical Therapy Treatment Patient Details Name: [name] MRN: [id number] DOB: [birth year] Today's Date: 03/07/2022   History of Present Illness 89 y/o female presented to ED on 02/28/22 for AMS and SOB. Recent admission 1/6-1/10 for PE. Now admitted for possible aspiration pneumonia and UTI. PMH: Afib, HTN, hx of PE, CHF, CKD stage 3a, PVD    PT Comments    Pt in bed, ready for session.  She does endorse feeling generally weaker than last week.  She is able to get to EOB with mod a x 1.  Good effort but does need quite a bit of assist to transition.  Once sitting she is steady sitting EOB x 15+ minutes with no need for verbal or tactile cues to correct.  Seated AROM x 10 with no LOB.  Attempted multiple stands.  X 2 with walker with feet blocked but she is unable to clear hips with +1 assist,  front assist for stand pivot transfer but again unable to clear hips and unsafe to attempt with +1.  Last week she was +1 max per notes.  +2 was called and she is able transfer to recliner with HHA +2.  She is positioned for comfort in chair with needs met and daughter in room.  Pt remains motivated to participate in sessions and supportive daughter in attendance.  Overall tolerates well and SNF does remain appropriate for transition.   Recommendations for follow up therapy are one component of a multi-disciplinary discharge planning process, led by the attending physician.  Recommendations may be updated based on patient status, additional functional criteria and insurance authorization.  Follow Up Recommendations  Skilled nursing-short term rehab (<3 hours/day) Can patient physically be transported by private vehicle: No   Assistance Recommended at Discharge Frequent or constant Supervision/Assistance  Patient can return home with the following Two people to help with walking and/or transfers;Two people to help with bathing/dressing/bathroom;Assistance with cooking/housework;Assistance with  feeding;Direct supervision/assist for medications management;Direct supervision/assist for financial management;Assist for transportation;Help with stairs or ramp for entrance   Equipment Recommendations  None recommended by PT    Recommendations for Other Services       Precautions / Restrictions Precautions Precautions: Fall Restrictions Weight Bearing Restrictions: No     Mobility  Bed Mobility Overal bed mobility: Needs Assistance Bed Mobility: Supine to Sit     Supine to sit: Mod assist          Transfers Overall transfer level: Needs assistance Equipment used: 2 person hand held assist         Squat pivot transfers: Mod assist, Max assist, +2 physical assistance     General transfer comment: +2 stand pivot to recliner at bedside.    Ambulation/Gait               General Gait Details: unable   Stairs             Wheelchair Mobility    Modified Rankin (Stroke Patients Only)       Balance Overall balance assessment: Needs assistance Sitting-balance support: Bilateral upper extremity supported, Feet supported Sitting balance-Leahy Scale: Fair Sitting balance - Comments: able to sit unsupported x 20 minutes during session with no tactile or verbal cues to correct.     Standing balance-Leahy Scale: Poor Standing balance comment: extensive +2 assist                            Cognition Arousal/Alertness: Awake/alert Behavior During  Therapy: WFL for tasks assessed/performed Overall Cognitive Status: Within Functional Limits for tasks assessed                                          Exercises Other Exercises Other Exercises: seated AROM EOB x 10    General Comments        Pertinent Vitals/Pain Pain Assessment Pain Assessment: Faces Faces Pain Scale: Hurts a little bit Pain Location: generalized Pain Descriptors / Indicators: Discomfort, Grimacing Pain Intervention(s): Limited activity within  patient's tolerance, Monitored during session, Repositioned    Home Living                          Prior Function            PT Goals (current goals can now be found in the care plan section) Progress towards PT goals: Progressing toward goals    Frequency    Min 2X/week      PT Plan Current plan remains appropriate    Co-evaluation              AM-PAC PT "6 Clicks" Mobility   Outcome Measure  Help needed turning from your back to your side while in a flat bed without using bedrails?: A Lot Help needed moving from lying on your back to sitting on the side of a flat bed without using bedrails?: A Lot Help needed moving to and from a bed to a chair (including a wheelchair)?: A Lot Help needed standing up from a chair using your arms (e.g., wheelchair or bedside chair)?: A Lot Help needed to walk in hospital room?: Total Help needed climbing 3-5 steps with a railing? : Total 6 Click Score: 10    End of Session Equipment Utilized During Treatment: Gait belt Activity Tolerance: Patient tolerated treatment well Patient left: in chair;with chair alarm set;with family/visitor present Nurse Communication: Mobility status PT Visit Diagnosis: Unsteadiness on feet (R26.81);Muscle weakness (generalized) (M62.81);Other abnormalities of gait and mobility (R26.89)     Time: 1314-1340 PT Time Calculation (min) (ACUTE ONLY): 26 min  Charges:  $Therapeutic Exercise: 8-22 mins $Therapeutic Activity: 8-22 mins                   Chesley Noon, PTA 03/07/22, 1:48 PM

## 2022-03-07 NOTE — Progress Notes (Signed)
Spoke with patients daughter and patient per daughters request. Daughter is verbally upset about the temperature in the room. Daughter has spoke to charge nurse and [facility] and is unhappy with the options giving. Per daughter the room has been cold all morning. Daughter requesting to be transferred [facility] and charge nurse notified. 1215 Reiterated that we have a room being cleaned if she would like to go with original plan giving by charge nurse. Patients daughter agrees to await cleaning process for dirty room and then transfer to available room.

## 2022-03-07 NOTE — Progress Notes (Addendum)
Spoke with pt's daughter regarding temperature of pt's room. She was complaining of the heat not working properly. I explained to her maintenance had been in earlier.Daughter was informed we would have to move patient for repairs to be made. She was also told our plan was to move her to another room as soon as one became available. Daughter seemed  satisfied with moving to another room. Jasmine Dorsett,RN Surveyor, quantity was notified of the complaint.

## 2022-03-07 NOTE — Plan of Care (Signed)
  Problem: Activity: Goal: Ability to tolerate increased activity will improve Outcome: Progressing   Problem: Clinical Measurements: Goal: Ability to maintain a body temperature in the normal range will improve Outcome: Progressing   Problem: Respiratory: Goal: Ability to maintain adequate ventilation will improve Outcome: Progressing Goal: Ability to maintain a clear airway will improve Outcome: Progressing

## 2022-03-07 NOTE — Progress Notes (Signed)
Occupational Therapy Treatment Patient Details Name: [name] MRN: [id number] DOB: 03-21-1928 Today's Date: 03/07/2022   History of present illness 89 y/o female presented to ED on 02/28/22 for AMS and SOB. Recent admission 1/6-1/10 for PE. Now admitted for possible aspiration pneumonia and UTI. PMH: Afib, HTN, hx of PE, CHF, CKD stage 3a, PVD   OT comments  Upon entering session, pt sitting up in recliner with daughter present. Both agreeable to OT. Pt requesting to get back in bed. She required multimodal cues for safe technique and Max A to scoot her hips forward in recliner to prepare for functional transfer. +2 assist called for pt safety. Squat pivot transfer completed from recliner>EOB via HHA. Once sitting EOB, she required Max A +2 to return to supine and scoot up toward [place]. Pt left semi-reclined in bed with all needs in reach. Pt is making progress toward goal completion. D/C recommendation remains appropriate. OT will continue to follow acutely.    Recommendations for follow up therapy are one component of a multi-disciplinary discharge planning process, led by the attending physician.  Recommendations may be updated based on patient status, additional functional criteria and insurance authorization.    Follow Up Recommendations  Skilled nursing-short term rehab (<3 hours/day)     Assistance Recommended at Discharge Frequent or constant Supervision/Assistance  Patient can return home with the following  Two people to help with bathing/dressing/bathroom;Two people to help with walking and/or transfers;Direct supervision/assist for medications management;Direct supervision/assist for financial management;Assistance with cooking/housework;Assist for transportation;Help with stairs or ramp for entrance   Equipment Recommendations  Hospital bed (pt already has rollator, shower seat, handheld shower head, BSC/3in1 & hoyer lift)    Recommendations for Other Services       Precautions / Restrictions Precautions Precautions: Fall Restrictions Weight Bearing Restrictions: No       Mobility Bed Mobility Overal bed mobility: Needs Assistance Bed Mobility: Sit to Supine       Sit to supine: Max assist, +2 for physical assistance   General bed mobility comments: +2 to scoot pt up toward [facility]    Transfers Overall transfer level: Needs assistance Equipment used: 2 person hand held assist Transfers: Bed to chair/wheelchair/BSC     Squat pivot transfers: Max assist, +2 physical assistance (from recliner>EOB)             Balance Overall balance assessment: Needs assistance Sitting-balance support: Bilateral upper extremity supported, Feet supported Sitting balance-Leahy Scale: Fair     Standing balance support: Bilateral upper extremity supported, During functional activity Standing balance-Leahy Scale: Poor                             ADL either performed or assessed with clinical judgement   ADL Overall ADL's : Needs assistance/impaired                                       General ADL Comments: pt deferred grooming tasks this date    Extremity/Trunk Assessment Upper Extremity Assessment Upper Extremity Assessment: Generalized weakness   Lower Extremity Assessment Lower Extremity Assessment: Generalized weakness   Cervical / Trunk Assessment Cervical / Trunk Assessment: Kyphotic    Vision Patient Visual Report: No change from baseline     Perception     Praxis      Cognition Arousal/Alertness: Awake/alert Behavior During Therapy: WFL for tasks  assessed/performed Overall Cognitive Status: Within Functional Limits for tasks assessed                                 General Comments: able to follow commands well, pleasant/cooperative        Exercises      Shoulder Instructions       General Comments      Pertinent Vitals/ Pain       Pain Assessment Pain Assessment:  Faces Faces Pain Scale: Hurts a little bit Pain Location: generalized Pain Descriptors / Indicators: Discomfort, Grimacing Pain Intervention(s): Limited activity within patient's tolerance, Monitored during session, Repositioned  Home Living                                          Prior Functioning/Environment              Frequency  Min 2X/week        Progress Toward Goals  OT Goals(current goals can now be found in the care plan section)  Progress towards OT goals: Progressing toward goals  Acute Rehab OT Goals Patient Stated Goal: daughter's goal is for pt to be able to stand pivot with assistance OT Goal Formulation: With patient/family Time For Goal Achievement: 03/15/22 Potential to Achieve Goals: [place] Discharge plan remains appropriate;Frequency remains appropriate    Co-evaluation                 AM-PAC OT "6 Clicks" Daily Activity     Outcome Measure   Help from another person eating meals?: A Lot Help from another person taking care of personal grooming?: A Lot Help from another person toileting, which includes using toliet, bedpan, or urinal?: Total Help from another person bathing (including washing, rinsing, drying)?: A Lot Help from another person to put on and taking off regular upper body clothing?: A Lot Help from another person to put on and taking off regular lower body clothing?: A Lot 6 Click Score: 11    End of Session Equipment Utilized During Treatment: Gait belt  OT Visit Diagnosis: Unsteadiness on feet (R26.81);Muscle weakness (generalized) (M62.81)   Activity Tolerance Patient tolerated treatment well   Patient Left with call bell/phone within reach;with family/visitor present;in bed;with bed alarm set   Nurse Communication Mobility status        Time: 7846-9629 OT Time Calculation (min): 13 min  Charges: OT General Charges $OT Visit: 1 Visit OT Treatments $Self Care/Home Management : 8-22  mins  [facility] MS, OTR/L ascom [phone]  03/07/22, 3:04 PM

## 2022-03-07 NOTE — TOC Progression Note (Signed)
Transition of Care [facility]) - Progression Note    Patient Details  Name: [name] MRN: [id number] Date of Birth: 1928/12/10  Transition of Care [facility]) CM/SW [place], RN Phone Number: 03/07/2022, 11:33 AM  Clinical Narrative:   Glenard Haring, Support Coordinator with [facility] Director offering P2P Deadline is today by 330 PM, They will need Name [place] ID [id number] phone number [phone] option 5, I notified the [place]    Expected Discharge Plan: [place] Barriers to Discharge: Continued Medical Work up  Expected Discharge Plan and Services   Discharge Planning Services: CM Consult Post Acute Care Choice: [place] arrangements for the past 2 months: Single Family Home                 DME Arranged: N/A DME Agency: NA                   Social Determinants of Health (SDOH) Interventions SDOH Screenings   Food Insecurity: No Food Insecurity (03/01/2022)  Housing: Low Risk  (03/01/2022)  Transportation Needs: No Transportation Needs (03/01/2022)  Utilities: Not At Risk (03/01/2022)  Depression (PHQ2-9): Low Risk  (11/30/2020)  Financial Resource Strain: Low Risk  (11/30/2020)  Physical Activity: Unknown (11/30/2020)  Social Connections: Unknown (11/30/2020)  Stress: No Stress Concern Present (11/30/2020)  Tobacco Use: Low Risk  (03/01/2022)    Readmission Risk Interventions    03/02/2022   12:47 PM 02/21/2022    9:54 AM  Readmission Risk Prevention Plan  Transportation Screening Complete Complete  Medication Review (RN Care Manager) Complete   PCP or Specialist appointment within 3-5 days of discharge Complete   HRI or [place] Complete Complete  SW Recovery Care/Counseling Consult Complete   Palliative Care Screening Complete Complete  [place] Complete

## 2022-03-08 DIAGNOSIS — J690 Pneumonitis due to inhalation of food and vomit: Secondary | ICD-10-CM | POA: Diagnosis not present

## 2022-03-08 NOTE — Progress Notes (Signed)
Physical Therapy Treatment Patient Details Name: [name] MRN: [id number] DOB: 03-17-1928 Today's Date: 03/08/2022   History of Present Illness 89 y/o female presented to ED on 02/28/22 for AMS and SOB. Recent admission 1/6-1/10 for PE. Now admitted for possible aspiration pneumonia and UTI. PMH: Afib, HTN, hx of PE, CHF, CKD stage 3a, PVD    PT Comments    Pt in bed, agrees to session.  She is able to lift legs off bed to assist with pillow removal.  She is able to advance legs on her own towards edge of bed better today but still needs mod a x 1 to fully transition to EOB with tactile cues for hand placements.  Once sitting she is steady with supervision and is able to do seated AROM and take pills without difficulty.  Bed is brought closer to sink base to use it as a stable place to assist with standing.  She is able to stand x 4 with max a x 2.  Pt does have a BM and care is provided in standing.  She is returned to supine with max a x 2 due to positioning on bed to prevent her sliding off.  She is encouraged and is able to participate in rolling left/right for continued care and linen change.  Overall good effort today for session but remains with global weakness.  SNF remains appropriate as she was living at home with family and now requires increased care needs.     Recommendations for follow up therapy are one component of a multi-disciplinary discharge planning process, led by the attending physician.  Recommendations may be updated based on patient status, additional functional criteria and insurance authorization.  Follow Up Recommendations  Skilled nursing-short term rehab (<3 hours/day)     Assistance Recommended at Discharge Frequent or constant Supervision/Assistance  Patient can return home with the following Two people to help with walking and/or transfers;Two people to help with bathing/dressing/bathroom;Assistance with cooking/housework;Assistance with feeding;Direct  supervision/assist for medications management;Direct supervision/assist for financial management;Assist for transportation;Help with stairs or ramp for entrance   Equipment Recommendations  None recommended by PT    Recommendations for Other Services       Precautions / Restrictions Precautions Precautions: Fall Restrictions Weight Bearing Restrictions: No     Mobility  Bed Mobility Overal bed mobility: Needs Assistance Bed Mobility: Supine to Sit, Sit to Supine     Supine to sit: Mod assist Sit to supine: Max assist, +2 for physical assistance        Transfers Overall transfer level: Needs assistance Equipment used: 2 person hand held assist Transfers: Sit to/from Stand Sit to Stand: Max assist, +2 physical assistance           General transfer comment: stood using sink for support x 4    Ambulation/Gait               General Gait Details: unable   Stairs             Wheelchair Mobility    Modified Rankin (Stroke Patients Only)       Balance Overall balance assessment: Needs assistance Sitting-balance support: Bilateral upper extremity supported, Feet supported Sitting balance-Leahy Scale: Fair Sitting balance - Comments: able to sit unsupported x 20 minutes during session with no tactile or verbal cues to correct. Postural control: Left lateral lean Standing balance support: Bilateral upper extremity supported Standing balance-Leahy Scale: Zero Standing balance comment: extensive +2 assist  Cognition Arousal/Alertness: Awake/alert Behavior During Therapy: WFL for tasks assessed/performed Overall Cognitive Status: Within Functional Limits for tasks assessed                                          Exercises Other Exercises Other Exercises: seated AROM EOB x 10    General Comments        Pertinent Vitals/Pain Pain Assessment Pain Assessment: Faces Faces Pain Scale:  Hurts little more Pain Location: generalized, B legs cramping with standing Pain Descriptors / Indicators: Discomfort, Grimacing, Cramping Pain Intervention(s): Limited activity within patient's tolerance, Monitored during session, Repositioned    Home Living                          Prior Function            PT Goals (current goals can now be found in the care plan section) Progress towards PT goals: Progressing toward goals    Frequency    Min 2X/week      PT Plan Current plan remains appropriate    Co-evaluation              AM-PAC PT "6 Clicks" Mobility   Outcome Measure  Help needed turning from your back to your side while in a flat bed without using bedrails?: A Lot Help needed moving from lying on your back to sitting on the side of a flat bed without using bedrails?: A Lot Help needed moving to and from a bed to a chair (including a wheelchair)?: Total Help needed standing up from a chair using your arms (e.g., wheelchair or bedside chair)?: Total Help needed to walk in hospital room?: Total Help needed climbing 3-5 steps with a railing? : Total 6 Click Score: 8    End of Session Equipment Utilized During Treatment: Gait belt Activity Tolerance: Patient tolerated treatment well Patient left: in bed;with call bell/phone within reach;with bed alarm set;with nursing/sitter in room;with family/visitor present Nurse Communication: Mobility status PT Visit Diagnosis: Unsteadiness on feet (R26.81);Muscle weakness (generalized) (M62.81);Other abnormalities of gait and mobility (R26.89)     Time: 5102-5852 PT Time Calculation (min) (ACUTE ONLY): 35 min  Charges:  $Therapeutic Activity: 23-37 mins                   Chesley Noon, PTA 03/08/22, 9:57 AM

## 2022-03-08 NOTE — Progress Notes (Signed)
Progress Note    [name]  FVC:[id number] DOB: 10-05-1928  DOA: 02/28/2022 PCP: [name], MD      Brief Narrative:    Medical records reviewed and are as summarized below:  [name] is a 89 y.o. female  with PMH of chronic diastolic CHF, A-fib and PE on Eliquis, HTN, HLD, hereditary hemochromatosis, colon cancer s/p surgical resection and chemo, GERD, ambulatory dysfunction, incontinence, recent hospitalization from 1/6 through 02/23/2022 for influenza B infection and bacterial pneumonia.  She presented to the hospital with altered mental status, productive cough and shortness of breath.  Reportedly, oxygen saturation was in the 70s on room air when EMS arrived.  She was placed on 100% oxygen via nonrebreather mask and brought to the emergency department for further management.   She was admitted to the hospital for aspiration pneumonia, acute metabolic encephalopathy, AKI.     Assessment/Plan:   Principal Problem:   Aspiration pneumonia ([place]) Active Problems:   Blood loss anemia   Acute metabolic encephalopathy   Paroxysmal atrial fibrillation (HCC)   Pulmonary embolism (HCC)   Essential hypertension   Chronic diastolic CHF (congestive heart failure) (HCC)   Hypercholesteremia   Restless legs syndrome   Normocytic anemia   Acute renal failure superimposed on stage 3a chronic kidney disease ([place])   DNR (do not resuscitate)    Body mass index is 29.96 kg/m.  Aspiration pneumonia: COVID-19, influenza and RSV tests were negative.  No growth on blood cultures.  Completed IV Unasyn followed by Augmentin on 03/05/2022.  Acute metabolic encephalopathy: Back to baseline.  Continue supportive care.  CT head did not show any acute abnormality.  AKI on CKD stage IIIa: Creatinine has improved to baseline.  Acute hypoxic respiratory failure: Improved.  She is tolerating room air.  Paroxysmal atrial fibrillation: Continue metoprolol and Eliquis  Skin tear  on the left arm: Continue local wound care  Hypomagnesemia: Improved  Hypokalemia: Continue potassium repletion  Acute blood loss anemia: H&H is stable.  Hemoglobin is 8.1.  She is asymptomatic. S/p transfusion with 1 unit of PRBCs on 03/01/2022.  Probable dysphagia, regurgitation, history of Barrett's esophagus: Aspiration precautions.  Continue Protonix.  Chronic diastolic CHF, peripheral edema: Continue Lasix  Other comorbidities include history of pulmonary embolism on Eliquis, hypercholesterolemia  Insurance authorization for SNF was denied.  Family has launched an appeal and result is still pending.   Diet Order             DIET DYS 3 Room service appropriate? Yes with Assist; Fluid consistency: Thin  Diet effective now                            Consultants: None  Procedures: None    Medications:    apixaban  5 mg Oral BID   calcium carbonate  500 mg of elemental calcium Oral BID WC   cholecalciferol  2,000 Units Oral Daily   docusate sodium  100 mg Oral BID   furosemide  20 mg Oral Daily   gabapentin  200 mg Oral QHS   metoprolol succinate  25 mg Oral Daily   pantoprazole  40 mg Oral BID   potassium chloride  20 mEq Oral Daily   simvastatin  10 mg Oral QHS   Continuous Infusions:     Anti-infectives (From admission, onward)    Start     Dose/Rate Route Frequency Ordered Stop   03/03/22 1230  Ampicillin-Sulbactam (UNASYN) 3 g in sodium chloride 0.9 % 100 mL IVPB  Status:  Discontinued        3 g 200 mL/hr over 30 Minutes Intravenous Every 6 hours 03/03/22 0658 03/03/22 0757   03/03/22 1000  amoxicillin-clavulanate (AUGMENTIN) 875-125 MG per tablet 1 tablet        1 tablet Oral Every 12 hours 03/03/22 0757 03/05/22 2241   02/28/22 1600  Ampicillin-Sulbactam (UNASYN) 3 g in sodium chloride 0.9 % 100 mL IVPB  Status:  Discontinued        3 g 200 mL/hr over 30 Minutes Intravenous Every 12 hours 02/28/22 1552 03/03/22 0658               Family Communication/Anticipated D/C date and plan/Code Status   DVT prophylaxis:  apixaban (ELIQUIS) tablet 5 mg     Code Status: DNR  Family Communication: Jeani Hawking, daughter, and patient's son-in-law Disposition Plan: Plan to discharge to SNF in 1 to 2 days   Status is: Inpatient Remains inpatient appropriate because: Awaiting placement to SNF       Subjective:   Interval events noted.  She has no complaints.  She feels well.  No shortness of breath or chest pain.  Jeani Hawking, daughter, and her husband (patient 79 son-in-law) were at the bedside.  Objective:    Vitals:   03/07/22 1635 03/07/22 2329 03/08/22 0745 03/08/22 0808  BP: (!) 163/72 134/60 (!) 152/60 (!) 148/59  Pulse: 82 67 77 78  Resp: '18 18 19 18  '$ Temp: 98.1 F (36.7 C) 98.2 F (36.8 C)  98.6 F (37 C)  TempSrc:    Oral  SpO2: 95% 98% 94%   Weight:      Height:       No data found.   Intake/Output Summary (Last 24 hours) at 03/08/2022 1333 Last data filed at 03/08/2022 0800 Gross per 24 hour  Intake 360 ml  Output 600 ml  Net -240 ml   Filed Weights   03/03/22 0500 03/04/22 0500 03/05/22 0500  Weight: 73 kg 74 kg 74.3 kg    Exam:  GEN: NAD SKIN: Fragile skin, bruises on upper and lower extremities. EYES: No pallor or icterus ENT: MMM CV: RRR PULM: CTA B ABD: soft, ND, NT, +BS CNS: AAO x 3, non focal EXT: Edema of bilateral upper and lower extremities, no tenderness   Data Reviewed:   I have personally reviewed following labs and imaging studies:  Labs: Labs show the following:   Basic Metabolic Panel: Recent Labs  Lab 03/02/22 0450 03/03/22 0449 03/04/22 0935 03/06/22 0534 03/07/22 0441  NA 130* 135 136 136  --   K 3.4* 3.8 3.6 3.3* 3.3*  CL 99 104 104 106  --   CO2 21* '24 25 24  '$ --   GLUCOSE 108* 103* 113* 94  --   BUN 24* '22 17 15  '$ --   CREATININE 1.22* 1.06* 0.94 0.92  --   CALCIUM 8.4* 8.3* 8.9 8.3*  --   MG 1.9 1.7 1.6* 2.0  --   PHOS 3.8  --   --    --   --    GFR Estimated Creatinine Clearance: 37.6 mL/min (by C-G formula based on SCr of 0.92 mg/dL). Liver Function Tests: Recent Labs  Lab 03/02/22 0450 03/03/22 0449  AST  --  14*  ALT  --  11  ALKPHOS  --  43  BILITOT  --  1.0  PROT  --  5.2*  ALBUMIN 2.3* 2.2*   No results for input(s): "LIPASE", "AMYLASE" in the last 168 hours. No results for input(s): "AMMONIA" in the last 168 hours. Coagulation profile No results for input(s): "INR", "PROTIME" in the last 168 hours.  CBC: Recent Labs  Lab 03/02/22 0450 03/03/22 0449 03/04/22 0935 03/05/22 0535 03/06/22 0534 03/07/22 0441  WBC 10.0 8.2 9.3 7.2 5.8  --   NEUTROABS  --  6.5  --  5.5 4.2  --   HGB 8.2* 7.7* 9.7* 7.8* 7.7* 8.1*  HCT 24.9* 23.4* 30.4* 24.5* 24.4* 25.5*  MCV 88.9 89.0 89.9 90.4 91.7  --   PLT 461* 421* 467* 416* 418*  --    Cardiac Enzymes: Recent Labs  Lab 03/02/22 0450  CKTOTAL 144   BNP (last 3 results) No results for input(s): "PROBNP" in the last 8760 hours. CBG: Recent Labs  Lab 03/05/22 1135 03/05/22 1645 03/06/22 0759 03/06/22 1623 03/07/22 0848  GLUCAP 105* 100* 83 116* 87   D-Dimer: No results for input(s): "DDIMER" in the last 72 hours. Hgb A1c: No results for input(s): "HGBA1C" in the last 72 hours. Lipid Profile: No results for input(s): "CHOL", "HDL", "LDLCALC", "TRIG", "CHOLHDL", "LDLDIRECT" in the last 72 hours. Thyroid function studies: No results for input(s): "TSH", "T4TOTAL", "T3FREE", "THYROIDAB" in the last 72 hours.  Invalid input(s): "FREET3" Anemia work up: No results for input(s): "VITAMINB12", "FOLATE", "FERRITIN", "TIBC", "IRON", "RETICCTPCT" in the last 72 hours. Sepsis Labs: Recent Labs  Lab 03/03/22 0449 03/04/22 0935 03/05/22 0535 03/06/22 0534  WBC 8.2 9.3 7.2 5.8    Microbiology Recent Results (from the past 240 hour(s))  Resp panel by RT-PCR (RSV, Flu A&B, Covid) Anterior Nasal Swab     Status: None   Collection Time: 02/28/22 12:14  PM   Specimen: Anterior Nasal Swab  Result Value Ref Range Status   SARS Coronavirus 2 by RT PCR NEGATIVE NEGATIVE Final    Comment: (NOTE) SARS-CoV-2 target nucleic acids are NOT DETECTED.  The SARS-CoV-2 RNA is generally detectable in upper respiratory specimens during the acute phase of infection. The lowest concentration of SARS-CoV-2 viral copies this assay can detect is 138 copies/mL. A negative result does not preclude SARS-Cov-2 infection and should not be used as the sole basis for treatment or other patient management decisions. A negative result may occur with  improper specimen collection/handling, submission of specimen other than nasopharyngeal swab, presence of viral mutation(s) within the areas targeted by this assay, and inadequate number of viral copies(<138 copies/mL). A negative result must be combined with clinical observations, patient history, and epidemiological information. The expected result is Negative.  Fact Sheet for Patients:  EntrepreneurPulse.com.au  Fact Sheet for Healthcare Providers:  IncredibleEmployment.be  This test is no t yet approved or cleared by the [place] FDA and  has been authorized for detection and/or diagnosis of SARS-CoV-2 by FDA under an Emergency Use Authorization (EUA). This EUA will remain  in effect (meaning this test can be used) for the duration of the COVID-19 declaration under Section 564(b)(1) of the Act, 21 U.S.C.section 360bbb-3(b)(1), unless the authorization is terminated  or revoked sooner.       Influenza A by PCR NEGATIVE NEGATIVE Final   Influenza B by PCR NEGATIVE NEGATIVE Final    Comment: (NOTE) The Xpert Xpress SARS-CoV-2/FLU/RSV plus assay is intended as an aid in the diagnosis of influenza from Nasopharyngeal swab specimens and should not be used as a sole basis for treatment. Nasal washings and aspirates are unacceptable  for Xpert Xpress  SARS-CoV-2/FLU/RSV testing.  Fact Sheet for Patients: EntrepreneurPulse.com.au  Fact Sheet for Healthcare Providers: IncredibleEmployment.be  This test is not yet approved or cleared by the [place] FDA and has been authorized for detection and/or diagnosis of SARS-CoV-2 by FDA under an Emergency Use Authorization (EUA). This EUA will remain in effect (meaning this test can be used) for the duration of the COVID-19 declaration under Section 564(b)(1) of the Act, 21 U.S.C. section 360bbb-3(b)(1), unless the authorization is terminated or revoked.     Resp Syncytial Virus by PCR NEGATIVE NEGATIVE Final    Comment: (NOTE) Fact Sheet for Patients: EntrepreneurPulse.com.au  Fact Sheet for Healthcare Providers: IncredibleEmployment.be  This test is not yet approved or cleared by the [place] FDA and has been authorized for detection and/or diagnosis of SARS-CoV-2 by FDA under an Emergency Use Authorization (EUA). This EUA will remain in effect (meaning this test can be used) for the duration of the COVID-19 declaration under Section 564(b)(1) of the Act, 21 U.S.C. section 360bbb-3(b)(1), unless the authorization is terminated or revoked.  Performed at [facility], [address], [place], [place] [address]   Urine Culture     Status: None   Collection Time: 02/28/22 12:14 PM   Specimen: Urine, Suprapubic  Result Value Ref Range Status   Specimen Description   Final    URINE, SUPRAPUBIC Performed at [facility], [address], [place], [place] [address]    Special Requests   Final    NONE Performed at [facility], [address], [place], [place] [address]    Culture   Final    NO GROWTH Performed at [facility], [place] [address] [place], [place] [address]    Report Status 03/01/2022 FINAL  Final  Culture, blood (routine x 2) Call MD if unable to  obtain prior to antibiotics being given     Status: None   Collection Time: 02/28/22  4:40 PM   Specimen: BLOOD  Result Value Ref Range Status   Specimen Description BLOOD BLOOD LEFT ARM  Final   Special Requests   Final    BOTTLES DRAWN AEROBIC AND ANAEROBIC Blood Culture results may not be optimal due to an inadequate volume of blood received in culture bottles   Culture   Final    NO GROWTH 5 DAYS Performed at [facility], [address], [place], [place] [address]    Report Status 03/05/2022 FINAL  Final  Culture, blood (routine x 2) Call MD if unable to obtain prior to antibiotics being given     Status: None   Collection Time: 02/28/22  4:41 PM   Specimen: BLOOD  Result Value Ref Range Status   Specimen Description BLOOD BLOOD LEFT HAND  Final   Special Requests   Final    BOTTLES DRAWN AEROBIC AND ANAEROBIC Blood Culture adequate volume   Culture   Final    NO GROWTH 5 DAYS Performed at [facility], [address] [place], [place] [address]    Report Status 03/05/2022 FINAL  Final    Procedures and diagnostic studies:  No results found.             LOS: 8 days   Caterin Tabares  [facility]   Pager on [email or web address]. If 7PM-7AM, please contact night-coverage at [email or web address]     03/08/2022, 1:33 PM

## 2022-03-08 NOTE — Plan of Care (Signed)

## 2022-03-08 NOTE — Plan of Care (Signed)
  Problem: Activity: Goal: Ability to tolerate increased activity will improve Outcome: Progressing   Problem: Respiratory: Goal: Ability to maintain adequate ventilation will improve Outcome: Progressing   Problem: Activity: Goal: Risk for activity intolerance will decrease Outcome: Progressing   Problem: Safety: Goal: Ability to remain free from injury will improve Outcome: Progressing

## 2022-03-08 NOTE — TOC Progression Note (Addendum)
Transition of Care [facility]) - Progression Note    Patient Details  Name: [name] MRN: [id number] Date of Birth: [birth year]  [facility]) CM/SW [place], RN Phone Number: 03/08/2022, 10:05 AM  Clinical Narrative:     Faxed the updated PT notes to [facility] to [phone] for the appeal that was started yesterday by her daughter  Expected Discharge Plan: Skilled Nursing Facility Barriers to Discharge: Continued Medical Work up  Expected Discharge Plan and Services   Discharge Planning Services: CM Consult Post Acute Care Choice: [place] Living arrangements for the past 2 months: Single Family Home                 DME Arranged: N/A DME Agency: NA                   Social Determinants of Health (SDOH) Interventions SDOH Screenings   Food Insecurity: No Food Insecurity (03/01/2022)  Housing: Low Risk  (03/01/2022)  Transportation Needs: No Transportation Needs (03/01/2022)  Utilities: Not At Risk (03/01/2022)  Depression (PHQ2-9): Low Risk  (11/30/2020)  Financial Resource Strain: Low Risk  (11/30/2020)  Physical Activity: Unknown (11/30/2020)  Social Connections: Unknown (11/30/2020)  Stress: No Stress Concern Present (11/30/2020)  Tobacco Use: Low Risk  (03/01/2022)    Readmission Risk Interventions    03/02/2022   12:47 PM 02/21/2022    9:54 AM  Readmission Risk Prevention Plan  Transportation Screening Complete Complete  Medication Review (RN Care Manager) Complete   PCP or Specialist appointment within 3-5 days of discharge Complete   HRI or [place] Complete Complete  SW Recovery Care/Counseling Consult Complete   Palliative Care Screening Complete Complete  [place] Complete

## 2022-03-09 ENCOUNTER — Telehealth: Payer: Medicare HMO | Admitting: Internal Medicine

## 2022-03-09 DIAGNOSIS — J690 Pneumonitis due to inhalation of food and vomit: Secondary | ICD-10-CM | POA: Diagnosis not present

## 2022-03-09 LAB — CBC WITH DIFFERENTIAL/PLATELET
Abs Immature Granulocytes: 0.03 10*3/uL (ref 0.00–0.07)
Basophils Absolute: 0 10*3/uL (ref 0.0–0.1)
Basophils Relative: 1 %
Eosinophils Absolute: 0.2 10*3/uL (ref 0.0–0.5)
Eosinophils Relative: 4 %
HCT: 24.8 % — ABNORMAL LOW (ref 36.0–46.0)
Hemoglobin: 7.8 g/dL — ABNORMAL LOW (ref 12.0–15.0)
Immature Granulocytes: 1 %
Lymphocytes Relative: 19 %
Lymphs Abs: 1.1 10*3/uL (ref 0.7–4.0)
MCH: 28.8 pg (ref 26.0–34.0)
MCHC: 31.5 g/dL (ref 30.0–36.0)
MCV: 91.5 fL (ref 80.0–100.0)
Monocytes Absolute: 0.5 10*3/uL (ref 0.1–1.0)
Monocytes Relative: 9 %
Neutro Abs: 3.8 10*3/uL (ref 1.7–7.7)
Neutrophils Relative %: 66 %
Platelets: 475 10*3/uL — ABNORMAL HIGH (ref 150–400)
RBC: 2.71 MIL/uL — ABNORMAL LOW (ref 3.87–5.11)
RDW: 15.1 % (ref 11.5–15.5)
WBC: 5.7 10*3/uL (ref 4.0–10.5)
nRBC: 0 % (ref 0.0–0.2)

## 2022-03-09 LAB — BASIC METABOLIC PANEL
Anion gap: 5 (ref 5–15)
BUN: 16 mg/dL (ref 8–23)
CO2: 25 mmol/L (ref 22–32)
Calcium: 8.5 mg/dL — ABNORMAL LOW (ref 8.9–10.3)
Chloride: 106 mmol/L (ref 98–111)
Creatinine, Ser: 0.9 mg/dL (ref 0.44–1.00)
GFR, Estimated: >60 mL/min (ref >60)
Glucose, Bld: 100 mg/dL — ABNORMAL HIGH (ref 70–99)
Potassium: 3.7 mmol/L (ref 3.5–5.1)
Sodium: 136 mmol/L (ref 135–145)

## 2022-03-09 LAB — MAGNESIUM: Magnesium: 1.9 mg/dL (ref 1.7–2.4)

## 2022-03-09 NOTE — Plan of Care (Signed)
  Problem: Activity: Goal: Ability to tolerate increased activity will improve Outcome: Progressing   Problem: Clinical Measurements: Goal: Diagnostic test results will improve Outcome: Progressing Goal: Respiratory complications will improve Outcome: Progressing   Problem: Activity: Goal: Risk for activity intolerance will decrease Outcome: Progressing   Problem: Nutrition: Goal: Adequate nutrition will be maintained Outcome: Progressing

## 2022-03-09 NOTE — TOC Progression Note (Signed)
Transition of Care [facility]) - Progression Note    Patient Details  Name: [name] MRN: [id number] Date of Birth: 03-Apr-1928  Transition of Care [facility]) CM/SW [place], RN Phone Number: 03/09/2022, 10:07 AM  Clinical Narrative:    Appeal is still pending   Expected Discharge Plan: [place] Barriers to Discharge: Continued Medical Work up  Expected Discharge Plan and Services   Discharge Planning Services: CM Consult Post Acute Care Choice: [place] Living arrangements for the past 2 months: Single Family Home                 DME Arranged: N/A DME Agency: NA                   Social Determinants of Health (SDOH) Interventions SDOH Screenings   Food Insecurity: No Food Insecurity (03/01/2022)  Housing: Low Risk  (03/01/2022)  Transportation Needs: No Transportation Needs (03/01/2022)  Utilities: Not At Risk (03/01/2022)  Depression (PHQ2-9): Low Risk  (11/30/2020)  Financial Resource Strain: Low Risk  (11/30/2020)  Physical Activity: Unknown (11/30/2020)  Social Connections: Unknown (11/30/2020)  Stress: No Stress Concern Present (11/30/2020)  Tobacco Use: Low Risk  (03/01/2022)    Readmission Risk Interventions    03/02/2022   12:47 PM 02/21/2022    9:54 AM  Readmission Risk Prevention Plan  Transportation Screening Complete Complete  Medication Review (RN Care Manager) Complete   PCP or Specialist appointment within 3-5 days of discharge Complete   HRI or [place] Complete Complete  SW Recovery Care/Counseling Consult Complete   Palliative Care Screening Complete Complete  [place] Complete

## 2022-03-09 NOTE — Progress Notes (Addendum)
Occupational Therapy Treatment Patient Details Name: [name] MRN: [id number] DOB: 19-Oct-1928 Today's Date: 03/09/2022   History of present illness 89 y/o female presented to ED on 02/28/22 for AMS and SOB. Recent admission 1/6-1/10 for PE. Now admitted for possible aspiration pneumonia and UTI. PMH: Afib, HTN, hx of PE, CHF, CKD stage 3a, PVD   OT comments  Chart reviewed,nurse cleared pt for participation in OT tx session. Co tx completed with PT on this date. Tx session targeted improving functional activity tolerance for improved Adl task completion. Pt performed grooming tasks seated at edge of bed for grooming tasks with L lateral lean noted with fatigue. Pt tolerated for approx 10 minutes. STS competed 2x with MAX A+2 with B knees blocked at sink. MAX A +2 to maintain upright standing. Pt with productive cough with mobility. Pt is left as received, all needs met .Slow progress is being made towards goals set fourth. Discharge recommendation remains appropriate.    Recommendations for follow up therapy are one component of a multi-disciplinary discharge planning process, led by the attending physician.  Recommendations may be updated based on patient status, additional functional criteria and insurance authorization.    Follow Up Recommendations  Skilled nursing-short term rehab (<3 hours/day)     Assistance Recommended at Discharge Frequent or constant Supervision/Assistance  Patient can return home with the following  Two people to help with bathing/dressing/bathroom;Two people to help with walking and/or transfers;Direct supervision/assist for medications management;Direct supervision/assist for financial management;Assistance with cooking/housework;Assist for transportation;Help with stairs or ramp for entrance   Equipment Recommendations  Hospital bed    Recommendations for Other Services      Precautions / Restrictions Precautions Precautions: Fall Restrictions Weight  Bearing Restrictions: No       Mobility Bed Mobility Overal bed mobility: Needs Assistance Bed Mobility: Supine to Sit, Sit to Supine, Rolling Rolling: Min assist   Supine to sit: Mod assist, +2 for physical assistance Sit to supine: Mod assist, +2 for physical assistance   General bed mobility comments: max a +2 for boost up to the bed with use of bed features    Transfers Overall transfer level: Needs assistance Equipment used: 2 person hand held assist Transfers: Sit to/from Stand Sit to Stand: Max assist, +2 physical assistance, From elevated surface           General transfer comment: 2 attempts , B knees blocked, step by step verbal cues     Balance Overall balance assessment: Needs assistance Sitting-balance support: Feet supported Sitting balance-Leahy Scale: Fair Sitting balance - Comments: left lateral lean with fatigue Postural control: Left lateral lean   Standing balance-Leahy Scale: Zero                             ADL either performed or assessed with clinical judgement   ADL Overall ADL's : Needs assistance/impaired     Grooming: Wash/dry face;Oral care;Sitting;Supervision/safety               Lower Body Dressing: Maximal assistance Lower Body Dressing Details (indicate cue type and reason): socks                    Extremity/Trunk Assessment              Vision       Perception     Praxis      Cognition Arousal/Alertness: Awake/alert Behavior During Therapy: WFL for tasks assessed/performed Overall  Cognitive Status: No family/caregiver present to determine baseline cognitive functioning                             Awareness: Emergent Problem Solving: Slow processing, Requires verbal cues, Requires tactile cues          Exercises      Shoulder Instructions       General Comments spo2 >90% on RA throughout, dependent edema noted in BLE, improved with removal of socks and positioning     Pertinent Vitals/ Pain       Pain Assessment Pain Assessment: No/denies pain  Home Living                                          Prior Functioning/Environment              Frequency  Min 2X/week        Progress Toward Goals  OT Goals(current goals can now be found in the care plan section)  Progress towards OT goals: Progressing toward goals     Plan Discharge plan remains appropriate;Frequency remains appropriate    Co-evaluation    PT/OT/SLP Co-Evaluation/Treatment: Yes Reason for Co-Treatment: Complexity of the patient's impairments (multi-system involvement);Necessary to address cognition/behavior during functional activity;For patient/therapist safety PT goals addressed during session: Mobility/safety with mobility;Balance OT goals addressed during session: ADL's and self-care      AM-PAC OT "6 Clicks" Daily Activity     Outcome Measure   Help from another person eating meals?: A Little Help from another person taking care of personal grooming?: A Lot Help from another person toileting, which includes using toliet, bedpan, or urinal?: A Lot Help from another person bathing (including washing, rinsing, drying)?: A Lot Help from another person to put on and taking off regular upper body clothing?: A Lot Help from another person to put on and taking off regular lower body clothing?: A Lot 6 Click Score: 13    End of Session    OT Visit Diagnosis: Unsteadiness on feet (R26.81);Muscle weakness (generalized) (M62.81)   Activity Tolerance Patient tolerated treatment well   Patient Left with call bell/phone within reach;in bed;with bed alarm set;with family/visitor present   Nurse Communication Mobility status        Time: 3818-2993 OT Time Calculation (min): 24 min  Charges: OT General Charges $OT Visit: 1 Visit OT Treatments $Self Care/Home Management : 8-22 mins  Shanon Payor, OTD OTR/L  03/09/22, 3:34 PM

## 2022-03-09 NOTE — Progress Notes (Signed)
Physical Therapy Treatment Patient Details Name: [name] MRN: [id number] DOB: Dec 25, 1928 Today's Date: 03/09/2022   History of Present Illness 89 y/o female presented to ED on 02/28/22 for AMS and SOB. Recent admission 1/6-1/10 for PE. Now admitted for possible aspiration pneumonia and UTI. PMH: Afib, HTN, hx of PE, CHF, CKD stage 3a, PVD    PT Comments    Patient received in bed, husband Joneen Caraway at bedside. She is motivated to participate with therapy. Patient requires cues for mobility initiation. Mod +2 assist for bed mobility and max +2 for sit to stand x 2 reps. Patient unable to get fully standing despite +2 assist. She will continue to benefit from skilled PT to improve strength and functional independence.      Recommendations for follow up therapy are one component of a multi-disciplinary discharge planning process, led by the attending physician.  Recommendations may be updated based on patient status, additional functional criteria and insurance authorization.  Follow Up Recommendations  Skilled nursing-short term rehab (<3 hours/day) Can patient physically be transported by private vehicle: No   Assistance Recommended at Discharge Frequent or constant Supervision/Assistance  Patient can return home with the following Two people to help with walking and/or transfers;Two people to help with bathing/dressing/bathroom;Assistance with cooking/housework;Assistance with feeding;Direct supervision/assist for medications management;Direct supervision/assist for financial management;Assist for transportation;Help with stairs or ramp for entrance   Equipment Recommendations  None recommended by PT    Recommendations for Other Services       Precautions / Restrictions Precautions Precautions: Fall Restrictions Weight Bearing Restrictions: No     Mobility  Bed Mobility Overal bed mobility: Needs Assistance Bed Mobility: Supine to Sit, Sit to Supine, Rolling Rolling: Min  assist   Supine to sit: Mod assist, +2 for physical assistance Sit to supine: Mod assist, +2 for physical assistance        Transfers Overall transfer level: Needs assistance Equipment used: 2 person hand held assist Transfers: Sit to/from Stand Sit to Stand: Max assist, +2 physical assistance, From elevated surface           General transfer comment: Patient stood using sink for support x 2, difficulty getting fully standing. Knees blocked for safety.    Ambulation/Gait               General Gait Details: unable   Stairs             Wheelchair Mobility    Modified Rankin (Stroke Patients Only)       Balance Overall balance assessment: Needs assistance Sitting-balance support: Feet supported Sitting balance-Leahy Scale: Fair Sitting balance - Comments: able to sit edge of bed well without external support for ~15 minutes. Repositioning 1x as patient got fatigued and began leaning on her left elbow. Postural control: Left lateral lean Standing balance support: Bilateral upper extremity supported, During functional activity, Reliant on assistive device for balance Standing balance-Leahy Scale: Zero Standing balance comment: extensive +2 assist                            Cognition Arousal/Alertness: Awake/alert Behavior During Therapy: WFL for tasks assessed/performed Overall Cognitive Status: Within Functional Limits for tasks assessed                               Problem Solving: Slow processing, Requires verbal cues, Requires tactile cues General Comments: able to follow commands well,  pleasant/cooperative        Exercises Total Joint Exercises Ankle Circles/Pumps: AROM, Both, 10 reps Short Arc Quad: AROM, Both, 10 reps    General Comments        Pertinent Vitals/Pain Pain Assessment Pain Assessment: No/denies pain    Home Living                          Prior Function            PT Goals  (current goals can now be found in the care plan section) Acute Rehab PT Goals Patient Stated Goal: daughter's goal is for patient to be able to stand pivot with assistance PT Goal Formulation: With patient/family Time For Goal Achievement: 03/15/22 Potential to Achieve Goals: Fair Progress towards PT goals: Progressing toward goals    Frequency    Min 2X/week      PT Plan Current plan remains appropriate    Co-evaluation PT/OT/SLP Co-Evaluation/Treatment: Yes   PT goals addressed during session: Mobility/safety with mobility;Balance        AM-PAC PT "6 Clicks" Mobility   Outcome Measure  Help needed turning from your back to your side while in a flat bed without using bedrails?: A Lot Help needed moving from lying on your back to sitting on the side of a flat bed without using bedrails?: A Lot Help needed moving to and from a bed to a chair (including a wheelchair)?: Total Help needed standing up from a chair using your arms (e.g., wheelchair or bedside chair)?: Total Help needed to walk in hospital room?: Total Help needed climbing 3-5 steps with a railing? : Total 6 Click Score: 8    End of Session   Activity Tolerance: Patient limited by fatigue Patient left: in bed;with bed alarm set;with family/visitor present Nurse Communication: Mobility status PT Visit Diagnosis: Unsteadiness on feet (R26.81);Muscle weakness (generalized) (M62.81);Other abnormalities of gait and mobility (R26.89)     Time: 1425-1450 PT Time Calculation (min) (ACUTE ONLY): 25 min  Charges:  $Therapeutic Activity: 8-22 mins                     Samyuktha Brau, PT, GCS 03/09/22,3:10 PM

## 2022-03-09 NOTE — TOC Progression Note (Signed)
Transition of Care [facility]) - Progression Note    Patient Details  Name: [name] MRN: [id number] Date of Birth: 05/15/1928  Transition of Care [facility]) CM/SW [place], RN Phone Number: 03/09/2022, 4:26 PM  Clinical Narrative:    Faxed updated Progress note to [facility] for Appeal Auth ID  number [id number], [id number]   Expected Discharge Plan: [place] Barriers to Discharge: Continued Medical Work up  Expected Discharge Plan and Services   Discharge Planning Services: CM Consult Post Acute Care Choice: [place] arrangements for the past 2 months: Single Family Home                 DME Arranged: N/A DME Agency: NA                   Social Determinants of Health (SDOH) Interventions SDOH Screenings   Food Insecurity: No Food Insecurity (03/01/2022)  Housing: Low Risk  (03/01/2022)  Transportation Needs: No Transportation Needs (03/01/2022)  Utilities: Not At Risk (03/01/2022)  Depression (PHQ2-9): Low Risk  (11/30/2020)  Financial Resource Strain: Low Risk  (11/30/2020)  Physical Activity: Unknown (11/30/2020)  Social Connections: Unknown (11/30/2020)  Stress: No Stress Concern Present (11/30/2020)  Tobacco Use: Low Risk  (03/01/2022)    Readmission Risk Interventions    03/02/2022   12:47 PM 02/21/2022    9:54 AM  Readmission Risk Prevention Plan  Transportation Screening Complete Complete  Medication Review (RN Care Manager) Complete   PCP or Specialist appointment within 3-5 days of discharge Complete   HRI or [place] Complete Complete  SW Recovery Care/Counseling Consult Complete   Palliative Care Screening Complete Complete  [place] Complete

## 2022-03-09 NOTE — Progress Notes (Addendum)
PROGRESS NOTE    [name]  JAS:[id number] DOB: 11-May-1928 DOA: 02/28/2022 PCP: [name], MD    Brief Narrative:  89 y.o. female  with PMH of chronic diastolic CHF, A-fib and PE on Eliquis, HTN, HLD, hereditary hemochromatosis, colon cancer s/p surgical resection and chemo, GERD, ambulatory dysfunction, incontinence, recent hospitalization from 1/6 through 02/23/2022 for influenza B infection and bacterial pneumonia.  She presented to the hospital with altered mental status, productive cough and shortness of breath.  Reportedly, oxygen saturation was in the 70s on room air when EMS arrived.  She was placed on 100% oxygen via nonrebreather mask and brought to the emergency department for further management.     She was admitted to the hospital for aspiration pneumonia, acute metabolic encephalopathy, AKI.   Assessment & Plan:   Principal Problem:   Aspiration pneumonia ([place]) Active Problems:   Blood loss anemia   Acute metabolic encephalopathy   Paroxysmal atrial fibrillation (HCC)   Pulmonary embolism (HCC)   Essential hypertension   Chronic diastolic CHF (congestive heart failure) (HCC)   Hypercholesteremia   Restless legs syndrome   Normocytic anemia   Acute renal failure superimposed on stage 3a chronic kidney disease ([place])   DNR (do not resuscitate)  Aspiration pneumonia: COVID-19, influenza and RSV tests were negative.  No growth on blood cultures.  Completed IV Unasyn followed by Augmentin on 03/05/2022.   Acute metabolic encephalopathy: Back to baseline.  Continue supportive care.  CT head did not show any acute abnormality.   AKI on CKD stage IIIa: Creatinine has improved to baseline.   Acute hypoxic respiratory failure: Improved.  She is tolerating room air.   Paroxysmal atrial fibrillation: Continue metoprolol and Eliquis   Skin tear on the left arm: Continue local wound care   Hypomagnesemia: Improved   Hypokalemia: Continue potassium repletion    Acute blood loss anemia: H&H is stable.  Hemoglobin is 8.1.  She is asymptomatic. S/p transfusion with 1 unit of PRBCs on 03/01/2022.   Probable dysphagia, regurgitation, history of Barrett's esophagus: Aspiration precautions.  Continue Protonix.   Chronic diastolic CHF, peripheral edema: Continue Lasix   Other comorbidities include history of pulmonary embolism on Eliquis, hypercholesterolemia   Insurance authorization for SNF was denied.  Patient's family has filed an appeal.  Repeat evaluation by insurance carrier is currently pending.  It is my medical opinion that this patient does have rehab potential and will benefit from skilled nursing facility placement.  I feel skilled nursing facility is appropriate level of care on discharge and will benefit the patient as well as minimize the risk of potential hospital readmission.  DVT prophylaxis: Eliquis Code Status: DNR Family Communication: Disposition Plan: Status is: Inpatient Remains inpatient appropriate because: Unsafe discharge plan.  Insurance denied SNF    Level of care: Telemetry Medical  Consultants:  None  Procedures:  None  Antimicrobials: None    Subjective: Seen and examined.  Resting in bed eating breakfast.  No complaints of pain.  Answers questions appropriately.  Objective: Vitals:   03/08/22 0808 03/08/22 1451 03/08/22 2344 03/09/22 0739  BP: (!) 148/59 137/62 138/67 (!) 140/62  Pulse: 78 66 82 68  Resp: '18 17 18 18  '$ Temp: 98.6 F (37 C) 98.7 F (37.1 C) 98.2 F (36.8 C) 98.1 F (36.7 C)  TempSrc: Oral     SpO2:  96% 96% 95%  Weight:      Height:        Intake/Output Summary (Last 24 hours)  at 03/09/2022 1139 Last data filed at 03/08/2022 1400 Gross per 24 hour  Intake 240 ml  Output 300 ml  Net -60 ml   Filed Weights   03/03/22 0500 03/04/22 0500 03/05/22 0500  Weight: 73 kg 74 kg 74.3 kg    Examination:  General exam: Appears calm and comfortable  Respiratory system: Clear to  auscultation. Respiratory effort normal. Cardiovascular system: S1-S2, RRR, no murmurs, no pedal edema Gastrointestinal system: Soft, NT/ND, normal bowel sounds Central nervous system: Alert.  Oriented x 2.  No focal deficits Extremities: Symmetric 5 x 5 power. Skin: No rashes, lesions or ulcers Psychiatry: Judgement and insight appear normal. Mood & affect appropriate.     Data Reviewed: I have personally reviewed following labs and imaging studies  CBC: Recent Labs  Lab 03/03/22 0449 03/04/22 0935 03/05/22 0535 03/06/22 0534 03/07/22 0441 03/09/22 0412  WBC 8.2 9.3 7.2 5.8  --  5.7  NEUTROABS 6.5  --  5.5 4.2  --  3.8  HGB 7.7* 9.7* 7.8* 7.7* 8.1* 7.8*  HCT 23.4* 30.4* 24.5* 24.4* 25.5* 24.8*  MCV 89.0 89.9 90.4 91.7  --  91.5  PLT 421* 467* 416* 418*  --  829*   Basic Metabolic Panel: Recent Labs  Lab 03/03/22 0449 03/04/22 0935 03/06/22 0534 03/07/22 0441 03/09/22 0412  NA 135 136 136  --  136  K 3.8 3.6 3.3* 3.3* 3.7  CL 104 104 106  --  106  CO2 '24 25 24  '$ --  25  GLUCOSE 103* 113* 94  --  100*  BUN '22 17 15  '$ --  16  CREATININE 1.06* 0.94 0.92  --  0.90  CALCIUM 8.3* 8.9 8.3*  --  8.5*  MG 1.7 1.6* 2.0  --  1.9   GFR: Estimated Creatinine Clearance: 38.4 mL/min (by C-G formula based on SCr of 0.9 mg/dL). Liver Function Tests: Recent Labs  Lab 03/03/22 0449  AST 14*  ALT 11  ALKPHOS 43  BILITOT 1.0  PROT 5.2*  ALBUMIN 2.2*   No results for input(s): "LIPASE", "AMYLASE" in the last 168 hours. No results for input(s): "AMMONIA" in the last 168 hours. Coagulation Profile: No results for input(s): "INR", "PROTIME" in the last 168 hours. Cardiac Enzymes: No results for input(s): "CKTOTAL", "CKMB", "CKMBINDEX", "TROPONINI" in the last 168 hours. BNP (last 3 results) No results for input(s): "PROBNP" in the last 8760 hours. HbA1C: No results for input(s): "HGBA1C" in the last 72 hours. CBG: Recent Labs  Lab 03/05/22 1135 03/05/22 1645  03/06/22 0759 03/06/22 1623 03/07/22 0848  GLUCAP 105* 100* 83 116* 87   Lipid Profile: No results for input(s): "CHOL", "HDL", "LDLCALC", "TRIG", "CHOLHDL", "LDLDIRECT" in the last 72 hours. Thyroid Function Tests: No results for input(s): "TSH", "T4TOTAL", "FREET4", "T3FREE", "THYROIDAB" in the last 72 hours. Anemia Panel: No results for input(s): "VITAMINB12", "FOLATE", "FERRITIN", "TIBC", "IRON", "RETICCTPCT" in the last 72 hours. Sepsis Labs: No results for input(s): "PROCALCITON", "LATICACIDVEN" in the last 168 hours.  Recent Results (from the past 240 hour(s))  Resp panel by RT-PCR (RSV, Flu A&B, Covid) Anterior Nasal Swab     Status: None   Collection Time: 02/28/22 12:14 PM   Specimen: Anterior Nasal Swab  Result Value Ref Range Status   SARS Coronavirus 2 by RT PCR NEGATIVE NEGATIVE Final    Comment: (NOTE) SARS-CoV-2 target nucleic acids are NOT DETECTED.  The SARS-CoV-2 RNA is generally detectable in upper respiratory specimens during the acute phase of infection. The  lowest concentration of SARS-CoV-2 viral copies this assay can detect is 138 copies/mL. A negative result does not preclude SARS-Cov-2 infection and should not be used as the sole basis for treatment or other patient management decisions. A negative result may occur with  improper specimen collection/handling, submission of specimen other than nasopharyngeal swab, presence of viral mutation(s) within the areas targeted by this assay, and inadequate number of viral copies(<138 copies/mL). A negative result must be combined with clinical observations, patient history, and epidemiological information. The expected result is Negative.  Fact Sheet for Patients:  EntrepreneurPulse.com.au  Fact Sheet for Healthcare Providers:  IncredibleEmployment.be  This test is no t yet approved or cleared by the [place] FDA and  has been authorized for detection and/or  diagnosis of SARS-CoV-2 by FDA under an Emergency Use Authorization (EUA). This EUA will remain  in effect (meaning this test can be used) for the duration of the COVID-19 declaration under Section 564(b)(1) of the Act, 21 U.S.C.section 360bbb-3(b)(1), unless the authorization is terminated  or revoked sooner.       Influenza A by PCR NEGATIVE NEGATIVE Final   Influenza B by PCR NEGATIVE NEGATIVE Final    Comment: (NOTE) The Xpert Xpress SARS-CoV-2/FLU/RSV plus assay is intended as an aid in the diagnosis of influenza from Nasopharyngeal swab specimens and should not be used as a sole basis for treatment. Nasal washings and aspirates are unacceptable for Xpert Xpress SARS-CoV-2/FLU/RSV testing.  Fact Sheet for Patients: EntrepreneurPulse.com.au  Fact Sheet for Healthcare Providers: IncredibleEmployment.be  This test is not yet approved or cleared by the [place] FDA and has been authorized for detection and/or diagnosis of SARS-CoV-2 by FDA under an Emergency Use Authorization (EUA). This EUA will remain in effect (meaning this test can be used) for the duration of the COVID-19 declaration under Section 564(b)(1) of the Act, 21 U.S.C. section 360bbb-3(b)(1), unless the authorization is terminated or revoked.     Resp Syncytial Virus by PCR NEGATIVE NEGATIVE Final    Comment: (NOTE) Fact Sheet for Patients: EntrepreneurPulse.com.au  Fact Sheet for Healthcare Providers: IncredibleEmployment.be  This test is not yet approved or cleared by the [place] FDA and has been authorized for detection and/or diagnosis of SARS-CoV-2 by FDA under an Emergency Use Authorization (EUA). This EUA will remain in effect (meaning this test can be used) for the duration of the COVID-19 declaration under Section 564(b)(1) of the Act, 21 U.S.C. section 360bbb-3(b)(1), unless the authorization is terminated  or revoked.  Performed at [facility], [address] [place], [place] [address]   Urine Culture     Status: None   Collection Time: 02/28/22 12:14 PM   Specimen: Urine, Suprapubic  Result Value Ref Range Status   Specimen Description   Final    URINE, SUPRAPUBIC Performed at [facility], [address], [place], [place] [address]    Special Requests   Final    NONE Performed at [facility], [address] [place], [place] [address]    Culture   Final    NO GROWTH Performed at [facility], [place] [address] [place],  [address]    Report Status 03/01/2022 FINAL  Final  Culture, blood (routine x 2) Call MD if unable to obtain prior to antibiotics being given     Status: None   Collection Time: 02/28/22  4:40 PM   Specimen: BLOOD  Result Value Ref Range Status   Specimen Description BLOOD BLOOD LEFT ARM  Final   Special Requests   Final    BOTTLES DRAWN AEROBIC AND ANAEROBIC Blood Culture results may not be optimal due to an inadequate volume of blood received in culture bottles   Culture   Final    NO GROWTH 5 DAYS Performed at [facility], [address] [place], [place] [address]    Report Status 03/05/2022 FINAL  Final  Culture, blood (routine x 2) Call MD if unable to obtain prior to antibiotics being given     Status: None   Collection Time: 02/28/22  4:41 PM   Specimen: BLOOD  Result Value Ref Range Status   Specimen Description BLOOD BLOOD LEFT HAND  Final   Special Requests   Final    BOTTLES DRAWN AEROBIC AND ANAEROBIC Blood Culture adequate volume   Culture   Final    NO GROWTH 5 DAYS Performed at [facility], [address], [place], [place] [address]    Report Status 03/05/2022 FINAL  Final         Radiology Studies: No results found.      Scheduled Meds:  apixaban  5 mg Oral BID   calcium carbonate  500 mg of elemental calcium Oral BID WC   cholecalciferol  2,000 Units Oral  Daily   docusate sodium  100 mg Oral BID   furosemide  20 mg Oral Daily   gabapentin  200 mg Oral QHS   metoprolol succinate  25 mg Oral Daily   pantoprazole  40 mg Oral BID   potassium chloride  20 mEq Oral Daily   simvastatin  10 mg Oral QHS   Continuous Infusions:   LOS: 9 days      Sidney Ace, MD [facility]   If 7PM-7AM, please contact night-coverage  03/09/2022, 11:39 AM

## 2022-03-10 DIAGNOSIS — J690 Pneumonitis due to inhalation of food and vomit: Secondary | ICD-10-CM | POA: Diagnosis not present

## 2022-03-10 LAB — GLUCOSE, CAPILLARY: Glucose-Capillary: 97 mg/dL (ref 70–99)

## 2022-03-10 NOTE — Plan of Care (Signed)
  Problem: Clinical Measurements: Goal: Ability to maintain clinical measurements within normal limits will improve Outcome: Progressing Goal: Respiratory complications will improve Outcome: Progressing Goal: Cardiovascular complication will be avoided Outcome: Progressing   Problem: Nutrition: Goal: Adequate nutrition will be maintained Outcome: Progressing

## 2022-03-10 NOTE — Plan of Care (Signed)
  Problem: Activity: Goal: Ability to tolerate increased activity will improve Outcome: Progressing   Problem: Respiratory: Goal: Ability to maintain adequate ventilation will improve Outcome: Progressing   Problem: Education: Goal: Knowledge of General Education information will improve Description: Including pain rating scale, medication(s)/side effects and non-pharmacologic comfort measures Outcome: Progressing   Problem: Nutrition: Goal: Adequate nutrition will be maintained Outcome: Progressing   Problem: Pain Managment: Goal: General experience of comfort will improve Outcome: Progressing   Problem: Safety: Goal: Ability to remain free from injury will improve Outcome: Progressing   Problem: Skin Integrity: Goal: Risk for impaired skin integrity will decrease Outcome: Progressing

## 2022-03-10 NOTE — Progress Notes (Signed)
PROGRESS NOTE    [name]  NLZ:[id number] DOB: 01-19-1929 DOA: 02/28/2022 PCP: [name], MD    Brief Narrative:  89 y.o. female  with PMH of chronic diastolic CHF, A-fib and PE on Eliquis, HTN, HLD, hereditary hemochromatosis, colon cancer s/p surgical resection and chemo, GERD, ambulatory dysfunction, incontinence, recent hospitalization from 1/6 through 02/23/2022 for influenza B infection and bacterial pneumonia.  She presented to the hospital with altered mental status, productive cough and shortness of breath.  Reportedly, oxygen saturation was in the 70s on room air when EMS arrived.  She was placed on 100% oxygen via nonrebreather mask and brought to the emergency department for further management.     She was admitted to the hospital for aspiration pneumonia, acute metabolic encephalopathy, AKI.   Assessment & Plan:   Principal Problem:   Aspiration pneumonia ([place]) Active Problems:   Blood loss anemia   Acute metabolic encephalopathy   Paroxysmal atrial fibrillation (HCC)   Pulmonary embolism (HCC)   Essential hypertension   Chronic diastolic CHF (congestive heart failure) (HCC)   Hypercholesteremia   Restless legs syndrome   Normocytic anemia   Acute renal failure superimposed on stage 3a chronic kidney disease ([place])   DNR (do not resuscitate)  Aspiration pneumonia: COVID-19, influenza and RSV tests were negative.  No growth on blood cultures.  Completed IV Unasyn followed by Augmentin on 03/05/2022.   Acute metabolic encephalopathy: Back to baseline.  Continue supportive care.  CT head did not show any acute abnormality.   AKI on CKD stage IIIa: Creatinine has improved to baseline.   Acute hypoxic respiratory failure: Improved.  She is tolerating room air.   Paroxysmal atrial fibrillation: Continue metoprolol and Eliquis   Skin tear on the left arm: Continue local wound care   Hypomagnesemia: Improved   Hypokalemia: Continue potassium repletion    Acute blood loss anemia: H&H is stable.  Hemoglobin is 8.1.  She is asymptomatic. S/p transfusion with 1 unit of PRBCs on 03/01/2022.   Probable dysphagia, regurgitation, history of Barrett's esophagus: Aspiration precautions.  Continue Protonix.   Chronic diastolic CHF, peripheral edema: Continue Lasix '20mg'$  daily   Other comorbidities include history of pulmonary embolism on Eliquis, hypercholesterolemia   Insurance authorization for SNF was denied.  Patient's family has filed an appeal.  Repeat evaluation by insurance carrier is currently pending.  It is my medical opinion that this patient does have rehab potential and will benefit from skilled nursing facility placement.  I feel skilled nursing facility is appropriate level of care on discharge and will benefit the patient as well as minimize the risk of potential hospital readmission.  DVT prophylaxis: Eliquis Code Status: DNR Family Communication: Disposition Plan: Status is: Inpatient Remains inpatient appropriate because: Unsafe discharge plan.  Insurance denied SNF    Level of care: Telemetry Medical  Consultants:  None  Procedures:  None  Antimicrobials: None    Subjective: Resting comfortably in bed.  No complaints  Objective: Vitals:   03/09/22 1510 03/09/22 2320 03/10/22 0500 03/10/22 0740  BP: (!) 138/53 (!) 141/60  (!) 141/52  Pulse: 72 60  71  Resp: '18 19  16  '$ Temp: 97.6 F (36.4 C) 98 F (36.7 C)  98.1 F (36.7 C)  TempSrc:      SpO2: 98% 99%  94%  Weight:   73.1 kg   Height:        Intake/Output Summary (Last 24 hours) at 03/10/2022 1318 Last data filed at 03/10/2022 9024 Gross  per 24 hour  Intake --  Output 750 ml  Net -750 ml   Filed Weights   03/04/22 0500 03/05/22 0500 03/10/22 0500  Weight: 74 kg 74.3 kg 73.1 kg    Examination:  General exam: Appears calm and comfortable  Respiratory system: Clear to auscultation. Respiratory effort normal. Cardiovascular system: S1-S2, RRR, no  murmurs, no pedal edema Gastrointestinal system: Soft, NT/ND, normal bowel sounds Central nervous system: Alert.  Oriented x 2.  No focal deficits Extremities: Symmetric 5 x 5 power. Skin: No rashes, lesions or ulcers Psychiatry: Judgement and insight appear normal. Mood & affect appropriate.     Data Reviewed: I have personally reviewed following labs and imaging studies  CBC: Recent Labs  Lab 03/04/22 0935 03/05/22 0535 03/06/22 0534 03/07/22 0441 03/09/22 0412  WBC 9.3 7.2 5.8  --  5.7  NEUTROABS  --  5.5 4.2  --  3.8  HGB 9.7* 7.8* 7.7* 8.1* 7.8*  HCT 30.4* 24.5* 24.4* 25.5* 24.8*  MCV 89.9 90.4 91.7  --  91.5  PLT 467* 416* 418*  --  759*   Basic Metabolic Panel: Recent Labs  Lab 03/04/22 0935 03/06/22 0534 03/07/22 0441 03/09/22 0412  NA 136 136  --  136  K 3.6 3.3* 3.3* 3.7  CL 104 106  --  106  CO2 25 24  --  25  GLUCOSE 113* 94  --  100*  BUN 17 15  --  16  CREATININE 0.94 0.92  --  0.90  CALCIUM 8.9 8.3*  --  8.5*  MG 1.6* 2.0  --  1.9   GFR: Estimated Creatinine Clearance: 38.1 mL/min (by C-G formula based on SCr of 0.9 mg/dL). Liver Function Tests: No results for input(s): "AST", "ALT", "ALKPHOS", "BILITOT", "PROT", "ALBUMIN" in the last 168 hours.  No results for input(s): "LIPASE", "AMYLASE" in the last 168 hours. No results for input(s): "AMMONIA" in the last 168 hours. Coagulation Profile: No results for input(s): "INR", "PROTIME" in the last 168 hours. Cardiac Enzymes: No results for input(s): "CKTOTAL", "CKMB", "CKMBINDEX", "TROPONINI" in the last 168 hours. BNP (last 3 results) No results for input(s): "PROBNP" in the last 8760 hours. HbA1C: No results for input(s): "HGBA1C" in the last 72 hours. CBG: Recent Labs  Lab 03/05/22 1645 03/06/22 0759 03/06/22 1623 03/07/22 0848 03/10/22 0807  GLUCAP 100* 83 116* 87 97   Lipid Profile: No results for input(s): "CHOL", "HDL", "LDLCALC", "TRIG", "CHOLHDL", "LDLDIRECT" in the last 72  hours. Thyroid Function Tests: No results for input(s): "TSH", "T4TOTAL", "FREET4", "T3FREE", "THYROIDAB" in the last 72 hours. Anemia Panel: No results for input(s): "VITAMINB12", "FOLATE", "FERRITIN", "TIBC", "IRON", "RETICCTPCT" in the last 72 hours. Sepsis Labs: No results for input(s): "PROCALCITON", "LATICACIDVEN" in the last 168 hours.  Recent Results (from the past 240 hour(s))  Culture, blood (routine x 2) Call MD if unable to obtain prior to antibiotics being given     Status: None   Collection Time: 02/28/22  4:40 PM   Specimen: BLOOD  Result Value Ref Range Status   Specimen Description BLOOD BLOOD LEFT ARM  Final   Special Requests   Final    BOTTLES DRAWN AEROBIC AND ANAEROBIC Blood Culture results may not be optimal due to an inadequate volume of blood received in culture bottles   Culture   Final    NO GROWTH 5 DAYS Performed at [facility], [address], [place], [place] [address]    Report Status 03/05/2022 FINAL  Final  Culture, blood (routine x 2) Call MD if unable to obtain prior to antibiotics being given     Status: None   Collection Time: 02/28/22  4:41 PM   Specimen: BLOOD  Result Value Ref Range Status   Specimen Description BLOOD BLOOD LEFT HAND  Final   Special Requests   Final    BOTTLES DRAWN AEROBIC AND ANAEROBIC Blood Culture adequate volume   Culture   Final    NO GROWTH 5 DAYS Performed at [facility], [address], [place], [place] [address]    Report Status 03/05/2022 FINAL  Final         Radiology Studies: No results found.      Scheduled Meds:  apixaban  5 mg Oral BID   calcium carbonate  500 mg of elemental calcium Oral BID WC   cholecalciferol  2,000 Units Oral Daily   docusate sodium  100 mg Oral BID   furosemide  20 mg Oral Daily   gabapentin  200 mg Oral QHS   metoprolol succinate  25 mg Oral Daily   pantoprazole  40 mg Oral BID   potassium chloride  20 mEq Oral Daily   simvastatin  10 mg Oral  QHS   Continuous Infusions:   LOS: 10 days      Sidney Ace, MD [facility]   If 7PM-7AM, please contact night-coverage  03/10/2022, 1:18 PM

## 2022-03-11 DIAGNOSIS — J690 Pneumonitis due to inhalation of food and vomit: Secondary | ICD-10-CM | POA: Diagnosis not present

## 2022-03-11 LAB — GLUCOSE, CAPILLARY: Glucose-Capillary: 88 mg/dL (ref 70–99)

## 2022-03-11 NOTE — TOC Progression Note (Addendum)
Transition of Care [facility]) - Progression Note    Patient Details  Name: [name] MRN: [id number] Date of Birth: Sep 16, 1928  Transition of Care [facility]) CM/SW [place], LCSW Phone Number: 03/11/2022, 9:20 AM  Clinical Narrative:    CSW called [facility], spoke to [employer] to follow up on pending appeal for SNF denial. She stated CSW needs to call the health plan to make sure the appeal was started on their end - [employer] @ [phone] - as they do not have record of it yet.  [facility], was transferred 3x. Spoke to [employer]. She stated the appeal is still in progress and there should be an outcome by tomorrow 1/27. Provided CSW's return number.   Expected Discharge Plan: [place] Barriers to Discharge: Continued Medical Work up  Expected Discharge Plan and Services   Discharge Planning Services: CM Consult Post Acute Care Choice: [place] Living arrangements for the past 2 months: Single Family Home                 DME Arranged: N/A DME Agency: NA                   Social Determinants of Health (SDOH) Interventions SDOH Screenings   Food Insecurity: No Food Insecurity (03/01/2022)  Housing: Low Risk  (03/01/2022)  Transportation Needs: No Transportation Needs (03/01/2022)  Utilities: Not At Risk (03/01/2022)  Depression (PHQ2-9): Low Risk  (11/30/2020)  Financial Resource Strain: Low Risk  (11/30/2020)  Physical Activity: Unknown (11/30/2020)  Social Connections: Unknown (11/30/2020)  Stress: No Stress Concern Present (11/30/2020)  Tobacco Use: Low Risk  (03/01/2022)    Readmission Risk Interventions    03/02/2022   12:47 PM 02/21/2022    9:54 AM  Readmission Risk Prevention Plan  Transportation Screening Complete Complete  Medication Review (RN Care Manager) Complete   PCP or Specialist appointment within 3-5 days of discharge Complete   HRI or [place] Complete Complete  SW  Recovery Care/Counseling Consult Complete   Palliative Care Screening Complete Complete  Skilled Nursing Facility Complete

## 2022-03-11 NOTE — Progress Notes (Signed)
PROGRESS NOTE    [name]  YKD:[id number] DOB: 02-02-1929 DOA: 02/28/2022 PCP: [name], MD    Brief Narrative:  89 y.o. female  with PMH of chronic diastolic CHF, A-fib and PE on Eliquis, HTN, HLD, hereditary hemochromatosis, colon cancer s/p surgical resection and chemo, GERD, ambulatory dysfunction, incontinence, recent hospitalization from 1/6 through 02/23/2022 for influenza B infection and bacterial pneumonia.  She presented to the hospital with altered mental status, productive cough and shortness of breath.  Reportedly, oxygen saturation was in the 70s on room air when EMS arrived.  She was placed on 100% oxygen via nonrebreather mask and brought to the emergency department for further management.   She was admitted to the hospital for aspiration pneumonia, acute metabolic encephalopathy, AKI.  As of 1/26 there is a pending p.o. for skilled nursing facility denial.  Appeal still in progress, per TOC should have outcome by 1/27.   Assessment & Plan:   Principal Problem:   Aspiration pneumonia ([place]) Active Problems:   Blood loss anemia   Acute metabolic encephalopathy   Paroxysmal atrial fibrillation (HCC)   Pulmonary embolism (HCC)   Essential hypertension   Chronic diastolic CHF (congestive heart failure) (HCC)   Hypercholesteremia   Restless legs syndrome   Normocytic anemia   Acute renal failure superimposed on stage 3a chronic kidney disease ([place])   DNR (do not resuscitate)  Aspiration pneumonia: COVID-19, influenza and RSV tests were negative.  No growth on blood cultures.  Completed IV Unasyn followed by Augmentin on 03/05/2022.  No further antibiotics.  Monitor vitals and fever curve   Acute metabolic encephalopathy: Back to baseline.  Continue supportive care.  CT head did not show any acute abnormality.   AKI on CKD stage IIIa: Creatinine has improved to baseline.   Acute hypoxic respiratory failure: Improved.  She is tolerating room air.    Paroxysmal atrial fibrillation: Continue metoprolol and Eliquis   Skin tear on the left arm: Continue local wound care   Hypomagnesemia: Improved   Hypokalemia: Continue potassium repletion   Acute blood loss anemia: H&H is stable.  Hemoglobin is 8.1.  She is asymptomatic. S/p transfusion with 1 unit of PRBCs on 03/01/2022.   Probable dysphagia, regurgitation, history of Barrett's esophagus: Aspiration precautions.  Continue Protonix.   Chronic diastolic CHF, peripheral edema: Continue Lasix '20mg'$  daily   Other comorbidities include history of pulmonary embolism on Eliquis, hypercholesterolemia   Insurance authorization for SNF was denied.  Patient's family has filed an appeal.  Repeat evaluation by insurance carrier is currently pending.  It is my medical opinion that this patient does have rehab potential and will benefit from skilled nursing facility placement.  I feel skilled nursing facility is appropriate level of care on discharge and will benefit the patient as well as minimize the risk of potential hospital readmission.  DVT prophylaxis: Eliquis Code Status: DNR Family Communication: Daughter at bedside 1/25 Disposition Plan: Status is: Inpatient Remains inpatient appropriate because: Unsafe discharge plan.  Insurance denied SNF    Level of care: Telemetry Medical  Consultants:  None  Procedures:  None  Antimicrobials: None    Subjective: Sitting up in chair.  Appears comfortable.  No complaints  Objective: Vitals:   03/09/22 1510 03/09/22 2320 03/10/22 0500 03/10/22 0740  BP: (!) 138/53 (!) 141/60  (!) 141/52  Pulse: 72 60  71  Resp: '18 19  16  '$ Temp: 97.6 F (36.4 C) 98 F (36.7 C)  98.1 F (36.7 C)  TempSrc:  SpO2: 98% 99%  94%  Weight:   73.1 kg   Height:        Intake/Output Summary (Last 24 hours) at 03/10/2022 1318 Last data filed at 03/10/2022 0914 Gross per 24 hour  Intake --  Output 750 ml  Net -750 ml   Filed Weights   03/04/22 0500  03/05/22 0500 03/10/22 0500  Weight: 74 kg 74.3 kg 73.1 kg    Examination:  General exam: Appears calm and comfortable  Respiratory system: Clear to auscultation. Respiratory effort normal. Cardiovascular system: S1-S2, RRR, no murmurs, no pedal edema Gastrointestinal system: Soft, NT/ND, normal bowel sounds Central nervous system: Alert.  Oriented x 2.  No focal deficits Extremities: Symmetric 5 x 5 power. Skin: No rashes, lesions or ulcers Psychiatry: Judgement and insight appear normal. Mood & affect appropriate.     Data Reviewed: I have personally reviewed following labs and imaging studies  CBC: Recent Labs  Lab 03/04/22 0935 03/05/22 0535 03/06/22 0534 03/07/22 0441 03/09/22 0412  WBC 9.3 7.2 5.8  --  5.7  NEUTROABS  --  5.5 4.2  --  3.8  HGB 9.7* 7.8* 7.7* 8.1* 7.8*  HCT 30.4* 24.5* 24.4* 25.5* 24.8*  MCV 89.9 90.4 91.7  --  91.5  PLT 467* 416* 418*  --  989*   Basic Metabolic Panel: Recent Labs  Lab 03/04/22 0935 03/06/22 0534 03/07/22 0441 03/09/22 0412  NA 136 136  --  136  K 3.6 3.3* 3.3* 3.7  CL 104 106  --  106  CO2 25 24  --  25  GLUCOSE 113* 94  --  100*  BUN 17 15  --  16  CREATININE 0.94 0.92  --  0.90  CALCIUM 8.9 8.3*  --  8.5*  MG 1.6* 2.0  --  1.9   GFR: Estimated Creatinine Clearance: 38.1 mL/min (by C-G formula based on SCr of 0.9 mg/dL). Liver Function Tests: No results for input(s): "AST", "ALT", "ALKPHOS", "BILITOT", "PROT", "ALBUMIN" in the last 168 hours.  No results for input(s): "LIPASE", "AMYLASE" in the last 168 hours. No results for input(s): "AMMONIA" in the last 168 hours. Coagulation Profile: No results for input(s): "INR", "PROTIME" in the last 168 hours. Cardiac Enzymes: No results for input(s): "CKTOTAL", "CKMB", "CKMBINDEX", "TROPONINI" in the last 168 hours. BNP (last 3 results) No results for input(s): "PROBNP" in the last 8760 hours. HbA1C: No results for input(s): "HGBA1C" in the last 72 hours. CBG: Recent  Labs  Lab 03/05/22 1645 03/06/22 0759 03/06/22 1623 03/07/22 0848 03/10/22 0807  GLUCAP 100* 83 116* 87 97   Lipid Profile: No results for input(s): "CHOL", "HDL", "LDLCALC", "TRIG", "CHOLHDL", "LDLDIRECT" in the last 72 hours. Thyroid Function Tests: No results for input(s): "TSH", "T4TOTAL", "FREET4", "T3FREE", "THYROIDAB" in the last 72 hours. Anemia Panel: No results for input(s): "VITAMINB12", "FOLATE", "FERRITIN", "TIBC", "IRON", "RETICCTPCT" in the last 72 hours. Sepsis Labs: No results for input(s): "PROCALCITON", "LATICACIDVEN" in the last 168 hours.  Recent Results (from the past 240 hour(s))  Culture, blood (routine x 2) Call MD if unable to obtain prior to antibiotics being given     Status: None   Collection Time: 02/28/22  4:40 PM   Specimen: BLOOD  Result Value Ref Range Status   Specimen Description BLOOD BLOOD LEFT ARM  Final   Special Requests   Final    BOTTLES DRAWN AEROBIC AND ANAEROBIC Blood Culture results may not be optimal due to an inadequate volume of blood received in  culture bottles   Culture   Final    NO GROWTH 5 DAYS Performed at [facility], [address] [place], [place] [address]    Report Status 03/05/2022 FINAL  Final  Culture, blood (routine x 2) Call MD if unable to obtain prior to antibiotics being given     Status: None   Collection Time: 02/28/22  4:41 PM   Specimen: BLOOD  Result Value Ref Range Status   Specimen Description BLOOD BLOOD LEFT HAND  Final   Special Requests   Final    BOTTLES DRAWN AEROBIC AND ANAEROBIC Blood Culture adequate volume   Culture   Final    NO GROWTH 5 DAYS Performed at [facility], [address] [place], [place] [address]    Report Status 03/05/2022 FINAL  Final         Radiology Studies: No results found.      Scheduled Meds:  apixaban  5 mg Oral BID   calcium carbonate  500 mg of elemental calcium Oral BID WC   cholecalciferol  2,000 Units Oral Daily    docusate sodium  100 mg Oral BID   furosemide  20 mg Oral Daily   gabapentin  200 mg Oral QHS   metoprolol succinate  25 mg Oral Daily   pantoprazole  40 mg Oral BID   potassium chloride  20 mEq Oral Daily   simvastatin  10 mg Oral QHS   Continuous Infusions:   LOS: 10 days      Sidney Ace, MD [facility]   If 7PM-7AM, please contact night-coverage  03/10/2022, 1:18 PM

## 2022-03-11 NOTE — Plan of Care (Signed)
  Problem: Activity: Goal: Ability to tolerate increased activity will improve Outcome: Progressing   Problem: Respiratory: Goal: Ability to maintain adequate ventilation will improve Outcome: Progressing   Problem: Safety: Goal: Ability to remain free from injury will improve Outcome: Progressing   Problem: Skin Integrity: Goal: Risk for impaired skin integrity will decrease Outcome: Progressing

## 2022-03-11 NOTE — TOC Progression Note (Signed)
Transition of Care [facility]) - Progression Note    Patient Details  Name: [name] MRN: [id number] Date of Birth: 1929-01-12  Transition of Care [facility]) CM/SW [place], LCSW Phone Number: 03/11/2022, 4:32 PM  Clinical Narrative:    Appeal approved and patient has auth to go to [facility]. CSW called Tammy at [facility] who states patient can come to [facility] tomorrow 1/27. Updated MD and daughter.    Expected Discharge Plan: [place] Barriers to Discharge: Continued Medical Work up  Expected Discharge Plan and Services   Discharge Planning Services: CM Consult Post Acute Care Choice: [place] Living arrangements for the past 2 months: Single Family Home                 DME Arranged: N/A DME Agency: NA                   Social Determinants of Health (SDOH) Interventions SDOH Screenings   Food Insecurity: No Food Insecurity (03/01/2022)  Housing: Low Risk  (03/01/2022)  Transportation Needs: No Transportation Needs (03/01/2022)  Utilities: Not At Risk (03/01/2022)  Depression (PHQ2-9): Low Risk  (11/30/2020)  Financial Resource Strain: Low Risk  (11/30/2020)  Physical Activity: Unknown (11/30/2020)  Social Connections: Unknown (11/30/2020)  Stress: No Stress Concern Present (11/30/2020)  Tobacco Use: Low Risk  (03/01/2022)    Readmission Risk Interventions    03/02/2022   12:47 PM 02/21/2022    9:54 AM  Readmission Risk Prevention Plan  Transportation Screening Complete Complete  Medication Review (RN Care Manager) Complete   PCP or Specialist appointment within 3-5 days of discharge Complete   HRI or [place] Complete Complete  SW Recovery Care/Counseling Consult Complete   Palliative Care Screening Complete Complete  Skilled Nursing Facility Complete

## 2022-03-11 NOTE — Progress Notes (Signed)
Physical Therapy Treatment Patient Details Name: [name] MRN: [id number] DOB: 03/16/1928 Today's Date: 03/11/2022   History of Present Illness 89 y/o female presented to ED on 02/28/22 for AMS and SOB. Recent admission 1/6-1/10 for PE. Now admitted for possible aspiration pneumonia and UTI. PMH: Afib, HTN, hx of PE, CHF, CKD stage 3a, PVD    PT Comments    Patient pleasant and motivated to participate with PT. Husband at bedside. She is ready to get back into bed, had been sitting up in recliner all morning. Patient requires mod +2 assist for scooting out to edge of chair. Cues for feet and hand placement for transfer. Patient requires max +2 for sit to stand from recliner. Max +2 for squat pivot to bed from recliner. She performed LE strengthening exercises in bed with good effort. Patient will continue to benefit from skilled PT to improve strength, functional independence and safety.      Recommendations for follow up therapy are one component of a multi-disciplinary discharge planning process, led by the attending physician.  Recommendations may be updated based on patient status, additional functional criteria and insurance authorization.  Follow Up Recommendations  Skilled nursing-short term rehab (<3 hours/day) Can patient physically be transported by private vehicle: No   Assistance Recommended at Discharge Frequent or constant Supervision/Assistance  Patient can return home with the following Two people to help with walking and/or transfers;Two people to help with bathing/dressing/bathroom;Assistance with cooking/housework;Assistance with feeding;Direct supervision/assist for medications management;Direct supervision/assist for financial management;Assist for transportation;Help with stairs or ramp for entrance   Equipment Recommendations  None recommended by PT    Recommendations for Other Services       Precautions / Restrictions Precautions Precautions:  Fall Restrictions Weight Bearing Restrictions: No     Mobility  Bed Mobility Overal bed mobility: Needs Assistance Bed Mobility: Sit to Supine     Supine to sit: Mod assist, HOB elevated     General bed mobility comments: max a +2 for boost up to the bed with use of bed features    Transfers Overall transfer level: Needs assistance Equipment used: 2 person hand held assist Transfers: Sit to/from Stand, Bed to chair/wheelchair/BSC Sit to Stand: Max assist, +2 physical assistance     Squat pivot transfers: Max assist, +2 physical assistance     General transfer comment: Performed sit to stand X2 patient needed to be cleaned up prior to SPT.    Ambulation/Gait               General Gait Details: unable   Stairs             Wheelchair Mobility    Modified Rankin (Stroke Patients Only)       Balance Overall balance assessment: Needs assistance Sitting-balance support: Feet supported Sitting balance-Leahy Scale: Fair     Standing balance support: Bilateral upper extremity supported, During functional activity, Reliant on assistive device for balance Standing balance-Leahy Scale: Zero Standing balance comment: extensive +2 assist                            Cognition Arousal/Alertness: Awake/alert Behavior During Therapy: WFL for tasks assessed/performed Overall Cognitive Status: Within Functional Limits for tasks assessed                                 General Comments: able to follow commands well, pleasant/cooperative  Exercises Total Joint Exercises Ankle Circles/Pumps: AROM, Both, 10 reps Short Arc Quad: AROM, Both, 10 reps Heel Slides: AROM, Both, 10 reps Hip ABduction/ADduction: AROM, Both, 10 reps    General Comments        Pertinent Vitals/Pain Pain Assessment Pain Assessment: No/denies pain    Home Living                          Prior Function            PT Goals (current  goals can now be found in the care plan section) Acute Rehab PT Goals Patient Stated Goal: daughter's goal is for patient to be able to stand pivot with assistance PT Goal Formulation: With patient/family Time For Goal Achievement: 03/15/22 Potential to Achieve Goals: Fair Progress towards PT goals: Progressing toward goals    Frequency    Min 2X/week      PT Plan Current plan remains appropriate    Co-evaluation              AM-PAC PT "6 Clicks" Mobility   Outcome Measure  Help needed turning from your back to your side while in a flat bed without using bedrails?: A Lot Help needed moving from lying on your back to sitting on the side of a flat bed without using bedrails?: A Lot Help needed moving to and from a bed to a chair (including a wheelchair)?: Total Help needed standing up from a chair using your arms (e.g., wheelchair or bedside chair)?: Total Help needed to walk in hospital room?: Total Help needed climbing 3-5 steps with a railing? : Total 6 Click Score: 8    End of Session Equipment Utilized During Treatment: Gait belt Activity Tolerance: Patient limited by fatigue Patient left: in bed;with call bell/phone within reach;with bed alarm set;with family/visitor present Nurse Communication: Mobility status PT Visit Diagnosis: Muscle weakness (generalized) (M62.81);Other abnormalities of gait and mobility (R26.89);Unsteadiness on feet (R26.81)     Time: 4132-4401 PT Time Calculation (min) (ACUTE ONLY): 22 min  Charges:  $Therapeutic Activity: 8-22 mins                     [employer], PT, [employer] 03/11/22,2:02 PM

## 2022-03-12 DIAGNOSIS — R531 Weakness: Secondary | ICD-10-CM | POA: Diagnosis not present

## 2022-03-12 DIAGNOSIS — I517 Cardiomegaly: Secondary | ICD-10-CM | POA: Diagnosis not present

## 2022-03-12 DIAGNOSIS — I5032 Chronic diastolic (congestive) heart failure: Secondary | ICD-10-CM | POA: Diagnosis not present

## 2022-03-12 DIAGNOSIS — E119 Type 2 diabetes mellitus without complications: Secondary | ICD-10-CM | POA: Diagnosis not present

## 2022-03-12 DIAGNOSIS — J189 Pneumonia, unspecified organism: Secondary | ICD-10-CM | POA: Diagnosis not present

## 2022-03-12 DIAGNOSIS — Z7401 Bed confinement status: Secondary | ICD-10-CM | POA: Diagnosis not present

## 2022-03-12 DIAGNOSIS — E559 Vitamin D deficiency, unspecified: Secondary | ICD-10-CM | POA: Diagnosis not present

## 2022-03-12 DIAGNOSIS — I4891 Unspecified atrial fibrillation: Secondary | ICD-10-CM | POA: Diagnosis not present

## 2022-03-12 DIAGNOSIS — Z79899 Other long term (current) drug therapy: Secondary | ICD-10-CM | POA: Diagnosis not present

## 2022-03-12 DIAGNOSIS — J90 Pleural effusion, not elsewhere classified: Secondary | ICD-10-CM | POA: Diagnosis not present

## 2022-03-12 DIAGNOSIS — S51819A Laceration without foreign body of unspecified forearm, initial encounter: Secondary | ICD-10-CM | POA: Diagnosis not present

## 2022-03-12 DIAGNOSIS — I10 Essential (primary) hypertension: Secondary | ICD-10-CM | POA: Diagnosis not present

## 2022-03-12 DIAGNOSIS — J690 Pneumonitis due to inhalation of food and vomit: Secondary | ICD-10-CM | POA: Diagnosis not present

## 2022-03-12 DIAGNOSIS — E440 Moderate protein-calorie malnutrition: Secondary | ICD-10-CM | POA: Diagnosis not present

## 2022-03-12 DIAGNOSIS — R0603 Acute respiratory distress: Secondary | ICD-10-CM | POA: Diagnosis not present

## 2022-03-12 DIAGNOSIS — M6281 Muscle weakness (generalized): Secondary | ICD-10-CM | POA: Diagnosis not present

## 2022-03-12 DIAGNOSIS — D509 Iron deficiency anemia, unspecified: Secondary | ICD-10-CM | POA: Diagnosis not present

## 2022-03-12 DIAGNOSIS — S51001A Unspecified open wound of right elbow, initial encounter: Secondary | ICD-10-CM | POA: Diagnosis not present

## 2022-03-12 DIAGNOSIS — E876 Hypokalemia: Secondary | ICD-10-CM | POA: Diagnosis not present

## 2022-03-12 DIAGNOSIS — D649 Anemia, unspecified: Secondary | ICD-10-CM | POA: Diagnosis not present

## 2022-03-12 MED ORDER — POTASSIUM CHLORIDE CRYS ER 20 MEQ PO TBCR: 20.0000 meq | EXTENDED_RELEASE_TABLET | Freq: Every day | ORAL | Status: DC

## 2022-03-12 NOTE — TOC Transition Note (Addendum)
Transition of Care [facility]) - CM/SW Discharge Note   Patient Details  Name: [name] MRN: [id number] Date of Birth: 1928-11-20  [facility] CM/SW Contact:  Magnus Ivan, LCSW Phone Number: 03/12/2022, 9:38 AM   Clinical Narrative:    [facility]charge to [facility] [place] today. Room 810. Confirmed with Admissions Worker Tammy. Updated MD, RN, and daughter ([name]). Asked RN to call report. EMS paperwork completed.  10:02- [facility] called for transport. Patient is next on the list pending truck availability, RN notified.   1:20- Called [facility] to make sure patient is still on list for transport, they confirmed she's next on the list.     Final next level of care: Skilled Nursing Facility Barriers to Discharge: Continued Medical Work up   Patient Goals and CMS Choice CMS Medicare.gov Compare Post Acute Care list provided to:: Patient Represenative (must comment) Choice offered to / list presented to : Adult Children  Discharge Placement                Patient chooses bed at: [facility] [place] Patient to be transferred to facility by: [facility] Name of family member notified: [name] Patient and family notified of of transfer: 03/12/22  Discharge Plan and Services Additional resources added to the After Visit Summary for     Discharge Planning Services: CM Consult Post Acute Care Choice: [place]          DME Arranged: N/A DME Agency: NA                  Social Determinants of Health (SDOH) Interventions SDOH Screenings   Food Insecurity: No Food Insecurity (03/01/2022)  Housing: Low Risk  (03/01/2022)  Transportation Needs: No Transportation Needs (03/01/2022)  Utilities: Not At Risk (03/01/2022)  Depression (PHQ2-9): Low Risk  (11/30/2020)  Financial Resource Strain: Low Risk  (11/30/2020)  Physical Activity: Unknown (11/30/2020)  Social Connections: Unknown (11/30/2020)  Stress: No Stress Concern Present (11/30/2020)  Tobacco Use: Low  Risk  (03/01/2022)     Readmission Risk Interventions    03/02/2022   12:47 PM 02/21/2022    9:54 AM  Readmission Risk Prevention Plan  Transportation Screening Complete Complete  Medication Review (RN Care Manager) Complete   PCP or Specialist appointment within 3-5 days of discharge Complete   HRI or [place] Complete Complete  SW Recovery Care/Counseling Consult Complete   Palliative Care Screening Complete Complete  [place] Complete

## 2022-03-12 NOTE — Discharge Summary (Signed)
Physician Discharge Summary  [name] ZOX:[id number] DOB: [birth year] DOA: 02/28/2022  PCP: [name], MD  Admit date: 02/28/2022 Discharge date: 03/12/2022  Admitted From: Home Disposition:  SNF  Recommendations for Outpatient Follow-up:  Follow up with PCP in 1-2 weeks   Home Health:NO Equipment/Devices:None   Discharge Condition:Stable CODE STATUS:DNR  Diet recommendation: Dysphagia 3  Brief/Interim Summary: 89 y.o. female  with PMH of chronic diastolic CHF, A-fib and PE on Eliquis, HTN, HLD, hereditary hemochromatosis, colon cancer s/p surgical resection and chemo, GERD, ambulatory dysfunction, incontinence, recent hospitalization from 1/6 through 02/23/2022 for influenza B infection and bacterial pneumonia.  She presented to the hospital with altered mental status, productive cough and shortness of breath.  Reportedly, oxygen saturation was in the 70s on room air when EMS arrived.  She was placed on 100% oxygen via nonrebreather mask and brought to the emergency department for further management.   She was admitted to the hospital for aspiration pneumonia, acute metabolic encephalopathy, AKI.   As of 1/26 there is a pending p.o. for skilled nursing facility denial.  Appeal successful.  [facility] discharged to [facility] in stable condition on 1/27    Discharge Diagnoses:  Principal Problem:   Aspiration pneumonia ([place]) Active Problems:   Blood loss anemia   Acute metabolic encephalopathy   Paroxysmal atrial fibrillation (HCC)   Pulmonary embolism (HCC)   Essential hypertension   Chronic diastolic CHF (congestive heart failure) (HCC)   Hypercholesteremia   Restless legs syndrome   Normocytic anemia   Acute renal failure superimposed on stage 3a chronic kidney disease ([place])   DNR (do not resuscitate)  Aspiration pneumonia: COVID-19, influenza and RSV tests were negative.  No growth on blood cultures.  Completed IV Unasyn followed by Augmentin on 03/05/2022.  No  further antibiotics.  Monitor vitals and fever curve   Acute metabolic encephalopathy: Back to baseline.  Continue supportive care.  CT head did not show any acute abnormality.   AKI on CKD stage IIIa: Creatinine has improved to baseline.   Acute hypoxic respiratory failure: Improved.  She is tolerating room air.   Paroxysmal atrial fibrillation: Continue metoprolol and Eliquis   Skin tear on the left arm: Continue local wound care   Hypomagnesemia: Improved   Hypokalemia: Continue potassium repletion   Acute blood loss anemia: H&H is stable.  Hemoglobin is 8.1.  She is asymptomatic. S/p transfusion with 1 unit of PRBCs on 03/01/2022.   Probable dysphagia, regurgitation, history of Barrett's esophagus: Aspiration precautions.  Continue Protonix.   Chronic diastolic CHF, peripheral edema: Continue Lasix '20mg'$  daily   Other comorbidities include history of pulmonary embolism on Eliquis, hypercholesterolemia  Discharge Instructions  Discharge Instructions     Diet - low sodium heart healthy   Complete by: As directed    Discharge wound care:   Complete by: As directed    Wound care  Daily      Comments: 1. Leave steri strips in place to left arm until they dry up and fall off.  Apply Xeroform gauze over the steri strips Q day, then cover with foam dressing.  (Change foam dressings Q 3 days or PRN soiling. ) 2. Foam dressing to bilat buttocks/sacrum and right arm.  Change Q 3 days or PRN soiling   Increase activity slowly   Complete by: As directed       Allergies as of 03/12/2022       Reactions   Fentanyl Other (See Comments)   confusion  Medication List     STOP taking these medications    amLODipine 5 MG tablet Commonly known as: NORVASC   benzonatate 200 MG capsule Commonly known as: TESSALON   diclofenac Sodium 1 % Gel Commonly known as: VOLTAREN   irbesartan 300 MG tablet Commonly known as: AVAPRO   metoCLOPramide 5 MG tablet Commonly known as:  REGLAN       TAKE these medications    acetaminophen 500 MG tablet Commonly known as: TYLENOL Take 500 mg by mouth every 6 (six) hours as needed.   albuterol 108 (90 Base) MCG/ACT inhaler Commonly known as: VENTOLIN HFA TAKE 2 PUFFS BY MOUTH EVERY 6 HOURS AS NEEDED FOR WHEEZE OR SHORTNESS OF BREATH What changed:  how much to take how to take this when to take this reasons to take this   apixaban 5 MG Tabs tablet Commonly known as: ELIQUIS Take 5 mg by mouth 2 (two) times daily.   calcium carbonate 1500 (600 Ca) MG Tabs tablet Commonly known as: OSCAL Take 600 mg of elemental calcium by mouth 2 (two) times daily with a meal.   docusate sodium 100 MG capsule Commonly known as: COLACE Take 100 mg by mouth 2 (two) times daily.   fexofenadine 60 MG tablet Commonly known as: ALLEGRA TAKE 1 TABLET EVERY DAY   furosemide 20 MG tablet Commonly known as: Lasix Take 1 tablet (20 mg total) by mouth daily.   gabapentin 100 MG capsule Commonly known as: NEURONTIN TAKE 2 CAPSULES AT BEDTIME What changed: additional instructions   ipratropium 0.06 % nasal spray Commonly known as: ATROVENT Place 2 sprays into both nostrils 4 (four) times daily.   metoprolol succinate 25 MG 24 hr tablet Commonly known as: TOPROL-XL TAKE 1 TABLET TWICE DAILY What changed: when to take this   optichamber diamond Misc   pantoprazole 40 MG tablet Commonly known as: PROTONIX Take 40 mg by mouth 2 (two) times daily.   potassium chloride SA 20 MEQ tablet Commonly known as: KLOR-CON M Take 1 tablet (20 mEq total) by mouth daily.   simvastatin 10 MG tablet Commonly known as: ZOCOR TAKE 1 TABLET (10 MG TOTAL) AT BEDTIME. What changed: See the new instructions.   Vitamin D3 50 MCG (2000 UT) capsule Take 2,000 Units by mouth daily.               Discharge Care Instructions  (From admission, onward)           Start     Ordered   03/12/22 0000  Discharge wound care:        Comments: Wound care  Daily      Comments: 1. Leave steri strips in place to left arm until they dry up and fall off.  Apply Xeroform gauze over the steri strips Q day, then cover with foam dressing.  (Change foam dressings Q 3 days or PRN soiling. ) 2. Foam dressing to bilat buttocks/sacrum and right arm.  Change Q 3 days or PRN soiling   03/12/22 0720            Contact information for after-discharge care     Destination     [facility] Preferred SNF .   Service: Skilled Nursing Contact information: [place] [phone]                    Allergies  Allergen Reactions   Fentanyl Other (See Comments)  confusion    Consultations: Palliative care   Procedures/Studies: CT Head Wo Contrast  Result Date: 02/28/2022 CLINICAL DATA:  Altered mental status.  Hypoxia.  Recent pneumonia. EXAM: CT HEAD WITHOUT CONTRAST TECHNIQUE: Contiguous axial images were obtained from the base of the skull through the vertex without intravenous contrast. RADIATION DOSE REDUCTION: This exam was performed according to the departmental dose-optimization program which includes automated exposure control, adjustment of the mA and/or kV according to patient size and/or use of iterative reconstruction technique. COMPARISON:  09/24/2021 FINDINGS: Brain: Ventricles are slightly asymmetric left greater than right unchanged. Remaining cisterns and other CSF spaces are within normal. There is mild chronic ischemic microvascular disease present. There is no mass, mass effect, shift of midline structures or acute hemorrhage. No evidence of acute infarction. Vascular: No hyperdense vessel or unexpected calcification. Skull: Normal. Negative for fracture or focal lesion. Sinuses/Orbits: Orbits are normal symmetric. There is mild chronic inflammatory change of the paranasal sinuses involving the ethmoid, sphenoid, left maxillary sinuses and  right frontal ethmoidal recess. Chronic opacification over the mastoid air cells bilaterally. Other: None. IMPRESSION: 1. No acute findings. 2. Mild chronic ischemic microvascular disease. 3. Mild chronic inflammatory change of the paranasal sinuses and mastoid air cells. Electronically Signed   By: Marin Olp M.D.   On: 02/28/2022 13:56   DG Chest Port 1 View  Result Date: 02/28/2022 CLINICAL DATA:  Shortness of breath. EXAM: PORTABLE CHEST 1 VIEW COMPARISON:  February 19, 2022 FINDINGS: Mediastinal contour is normal. The heart size is enlarged. Patchy consolidation of the left mid lung and left lung base are identified. Probable left pleural effusion. Minimal atelectasis of right lung base noted. Biapical pleural thickening noted. The visualized skeletal structures are stable. IMPRESSION: Patchy consolidation of the left mid lung and left lung base, pneumonia is not excluded. Electronically Signed   By: Abelardo Diesel M.D.   On: 02/28/2022 12:55   CT Chest Wo Contrast  Result Date: 02/19/2022 CLINICAL DATA:  Shortness of breath. EXAM: CT CHEST WITHOUT CONTRAST TECHNIQUE: Multidetector CT imaging of the chest was performed following the standard protocol without IV contrast. RADIATION DOSE REDUCTION: This exam was performed according to the departmental dose-optimization program which includes automated exposure control, adjustment of the mA and/or kV according to patient size and/or use of iterative reconstruction technique. COMPARISON:  August 14, 2014 FINDINGS: Cardiovascular: There is marked severity calcification of the aortic arch and descending thoracic aorta the ascending thoracic aorta measures 3.3 cm in diameter. Mild cardiomegaly with moderate severity coronary artery calcification. A small anterior pericardial effusion is seen (4 mm in AP measurement). Mediastinum/Nodes: There is mild paratracheal lymphadenopathy. Subcentimeter calcified lymph nodes are seen along the left hilum. Thyroid gland,  trachea, and esophagus demonstrate no significant findings. Lungs/Pleura: Mild to moderate severity biapical scarring and/or atelectasis is seen, right greater than left. Mild to moderate severity lingular and posterior left basilar atelectasis and/or infiltrate is also seen. Mild posterolateral right upper lobe and posterolateral right upper lobe infiltrates are noted. A 5 mm noncalcified lung nodule versus focal scar seen within the anterior aspect of the right lower lobe (axial CT image 88, CT series 3). A 5 mm calcified lung nodule is seen within the left lower lobe. There are small bilateral pleural effusions, left greater than right. No pneumothorax is identified. Upper Abdomen: There is a large hiatal hernia. Numerous punctate calcified granulomas are seen scattered throughout the spleen. There is stable diffuse left adrenal gland enlargement. A 3.8 cm x 3.1  cm partially imaged and partially calcified cyst is seen along the upper pole of the left kidney. Musculoskeletal: A chronic compression fracture deformity is seen at the level of T7 vertebral body. Multilevel degenerative changes seen throughout the thoracic spine. IMPRESSION: 1. Mild posterolateral right upper lobe and posterolateral right upper lobe infiltrates. 2. Mild to moderate severity lingular and posterior left basilar atelectasis and/or infiltrate. 3. Small bilateral pleural effusions, left greater than right. 4. 5 mm noncalcified right lower lobe lung nodule versus focal scar. No follow-up needed if patient is low-risk.This recommendation follows the consensus statement: Guidelines for Management of Incidental Pulmonary Nodules Detected on CT Images: From the Fleischner Society 2017; Radiology 2017; 284:228-243. 5. Large hiatal hernia. 6. Small anterior pericardial effusion. 7. Chronic compression fracture deformity at the level of T7 vertebral body. 8. Aortic atherosclerosis. Aortic Atherosclerosis (ICD10-I70.0). Electronically Signed   By:  Virgina Norfolk M.D.   On: 02/19/2022 20:01   DG Chest Portable 1 View  Result Date: 02/19/2022 CLINICAL DATA:  Short of breath, hypoxia EXAM: PORTABLE CHEST 1 VIEW COMPARISON:  12/13/2021 FINDINGS: Single frontal view of the chest demonstrates a stable cardiac silhouette. Large hiatal hernia. Increased density in the retrocardiac region may reflect consolidation and/or effusion. Trace right pleural effusion. Nodular shadows projecting over the right anterior second rib and third anterior intercostal space likely reflect vessels seen on and. No pneumothorax. No acute bony abnormality. IMPRESSION: 1. Increased density at the left lung base, which may reflect consolidation and/or effusion. 2. Trace right pleural effusion. 3. Stable enlarged cardiac silhouette. 4. Hiatal hernia. Electronically Signed   By: Randa Ngo M.D.   On: 02/19/2022 16:00      Subjective: Seen and examined on day of dc.  Stable, no distress  Discharge Exam: Vitals:   03/11/22 1634 03/12/22 0001  BP: (!) 151/71 139/60  Pulse: 67 66  Resp: 16 20  Temp: 98.5 F (36.9 C) 98.7 F (37.1 C)  SpO2: 97% 96%   Vitals:   03/11/22 0808 03/11/22 1137 03/11/22 1634 03/12/22 0001  BP: (!) 145/57 (!) 152/66 (!) 151/71 139/60  Pulse: 67 72 67 66  Resp: '17  16 20  '$ Temp: 98.1 F (36.7 C)  98.5 F (36.9 C) 98.7 F (37.1 C)  TempSrc:      SpO2: 96%  97% 96%  Weight:      Height:        General: Pt is alert, awake, not in acute distress Cardiovascular: RRR, S1/S2 +, no rubs, no gallops Respiratory: CTA bilaterally, no wheezing, no rhonchi Abdominal: Soft, NT, ND, bowel sounds + Extremities: no edema, no cyanosis    The results of significant diagnostics from this hospitalization (including imaging, microbiology, ancillary and laboratory) are listed below for reference.     Microbiology: No results found for this or any previous visit (from the past 240 hour(s)).   Labs: BNP (last 3 results) Recent Labs     12/13/21 1410 02/19/22 1546 02/28/22 1142  BNP 228.6* 418.1* 166.0*   Basic Metabolic Panel: Recent Labs  Lab 03/06/22 0534 03/07/22 0441 03/09/22 0412  NA 136  --  136  K 3.3* 3.3* 3.7  CL 106  --  106  CO2 24  --  25  GLUCOSE 94  --  100*  BUN 15  --  16  CREATININE 0.92  --  0.90  CALCIUM 8.3*  --  8.5*  MG 2.0  --  1.9   Liver Function Tests: No results for input(s): "  AST", "ALT", "ALKPHOS", "BILITOT", "PROT", "ALBUMIN" in the last 168 hours. No results for input(s): "LIPASE", "AMYLASE" in the last 168 hours. No results for input(s): "AMMONIA" in the last 168 hours. CBC: Recent Labs  Lab 03/06/22 0534 03/07/22 0441 03/09/22 0412  WBC 5.8  --  5.7  NEUTROABS 4.2  --  3.8  HGB 7.7* 8.1* 7.8*  HCT 24.4* 25.5* 24.8*  MCV 91.7  --  91.5  PLT 418*  --  475*   Cardiac Enzymes: No results for input(s): "CKTOTAL", "CKMB", "CKMBINDEX", "TROPONINI" in the last 168 hours. BNP: Invalid input(s): "POCBNP" CBG: Recent Labs  Lab 03/06/22 0759 03/06/22 1623 03/07/22 0848 03/10/22 0807 03/11/22 0801  GLUCAP 83 116* 87 97 88   D-Dimer No results for input(s): "DDIMER" in the last 72 hours. Hgb A1c No results for input(s): "HGBA1C" in the last 72 hours. Lipid Profile No results for input(s): "CHOL", "HDL", "LDLCALC", "TRIG", "CHOLHDL", "LDLDIRECT" in the last 72 hours. Thyroid function studies No results for input(s): "TSH", "T4TOTAL", "T3FREE", "THYROIDAB" in the last 72 hours.  Invalid input(s): "FREET3" Anemia work up No results for input(s): "VITAMINB12", "FOLATE", "FERRITIN", "TIBC", "IRON", "RETICCTPCT" in the last 72 hours. Urinalysis    Component Value Date/Time   COLORURINE AMBER (A) 02/28/2022 1215   APPEARANCEUR CLOUDY (A) 02/28/2022 1215   LABSPEC 1.016 02/28/2022 1215   PHURINE 5.0 02/28/2022 1215   GLUCOSEU NEGATIVE 02/28/2022 1215   GLUCOSEU NEGATIVE 04/04/2018 1255   HGBUR MODERATE (A) 02/28/2022 1215   BILIRUBINUR NEGATIVE 02/28/2022 1215    KETONESUR NEGATIVE 02/28/2022 1215   PROTEINUR 30 (A) 02/28/2022 1215   UROBILINOGEN 0.2 04/04/2018 1255   NITRITE NEGATIVE 02/28/2022 1215   LEUKOCYTESUR NEGATIVE 02/28/2022 1215   Sepsis Labs Recent Labs  Lab 03/06/22 0534 03/09/22 0412  WBC 5.8 5.7   Microbiology No results found for this or any previous visit (from the past 240 hour(s)).   Time coordinating discharge: Over 30 minutes  SIGNED:   Sidney Ace, MD  [facility] 03/12/2022, 7:21 AM Pager   If 7PM-7AM, please contact night-coverage

## 2022-03-12 NOTE — TOC Progression Note (Signed)
Transition of Care [facility]) - Progression Note    Patient Details  Name: [name] MRN: [id number] Date of Birth: [birth year]  Transition of Care [facility]) CM/SW [place], LCSW Phone Number: 03/12/2022, 9:24 AM  Clinical Narrative:    [facility] to [facility].  9:20- Reached out to [facility] again, Tammy to send room #. Tammy requested auth info which was provided from [facility] portal.    Expected Discharge Plan: [place] Barriers to Discharge: Continued Medical Work up  Expected Discharge Plan and Services   Discharge Planning Services: CM Consult Post Acute Care Choice: [place] Living arrangements for the past 2 months: Single Family Home Expected Discharge Date: 03/12/22               DME Arranged: N/A DME Agency: NA                   Social Determinants of Health (SDOH) Interventions SDOH Screenings   Food Insecurity: No Food Insecurity (03/01/2022)  Housing: Low Risk  (03/01/2022)  Transportation Needs: No Transportation Needs (03/01/2022)  Utilities: Not At Risk (03/01/2022)  Depression (PHQ2-9): Low Risk  (11/30/2020)  Financial Resource Strain: Low Risk  (11/30/2020)  Physical Activity: Unknown (11/30/2020)  Social Connections: Unknown (11/30/2020)  Stress: No Stress Concern Present (11/30/2020)  Tobacco Use: Low Risk  (03/01/2022)    Readmission Risk Interventions    03/02/2022   12:47 PM 02/21/2022    9:54 AM  Readmission Risk Prevention Plan  Transportation Screening Complete Complete  Medication Review (RN Care Manager) Complete   PCP or Specialist appointment within 3-5 days of discharge Complete   HRI or [place] Complete Complete  SW Recovery Care/Counseling Consult Complete   Palliative Care Screening Complete Complete  Skilled Nursing Facility Complete

## 2022-03-14 DIAGNOSIS — S51819A Laceration without foreign body of unspecified forearm, initial encounter: Secondary | ICD-10-CM | POA: Diagnosis not present

## 2022-03-14 DIAGNOSIS — I4891 Unspecified atrial fibrillation: Secondary | ICD-10-CM | POA: Diagnosis not present

## 2022-03-14 DIAGNOSIS — D649 Anemia, unspecified: Secondary | ICD-10-CM | POA: Diagnosis not present

## 2022-03-14 DIAGNOSIS — J189 Pneumonia, unspecified organism: Secondary | ICD-10-CM | POA: Diagnosis not present

## 2022-03-16 DIAGNOSIS — E440 Moderate protein-calorie malnutrition: Secondary | ICD-10-CM | POA: Diagnosis not present

## 2022-03-16 DIAGNOSIS — J189 Pneumonia, unspecified organism: Secondary | ICD-10-CM | POA: Diagnosis not present

## 2022-03-16 DIAGNOSIS — E876 Hypokalemia: Secondary | ICD-10-CM | POA: Diagnosis not present

## 2022-03-17 DIAGNOSIS — I5032 Chronic diastolic (congestive) heart failure: Secondary | ICD-10-CM | POA: Diagnosis not present

## 2022-03-17 DIAGNOSIS — J90 Pleural effusion, not elsewhere classified: Secondary | ICD-10-CM | POA: Diagnosis not present

## 2022-03-18 DIAGNOSIS — I10 Essential (primary) hypertension: Secondary | ICD-10-CM | POA: Diagnosis not present

## 2022-03-21 ENCOUNTER — Telehealth: Payer: Medicare HMO | Admitting: Internal Medicine

## 2022-03-21 DIAGNOSIS — I5032 Chronic diastolic (congestive) heart failure: Secondary | ICD-10-CM | POA: Diagnosis not present

## 2022-03-21 DIAGNOSIS — D649 Anemia, unspecified: Secondary | ICD-10-CM | POA: Diagnosis not present

## 2022-03-21 DIAGNOSIS — J90 Pleural effusion, not elsewhere classified: Secondary | ICD-10-CM | POA: Diagnosis not present

## 2022-03-25 DIAGNOSIS — J189 Pneumonia, unspecified organism: Secondary | ICD-10-CM | POA: Diagnosis not present

## 2022-03-31 ENCOUNTER — Other Ambulatory Visit: Payer: Self-pay | Admitting: Internal Medicine

## 2022-03-31 DIAGNOSIS — I5032 Chronic diastolic (congestive) heart failure: Secondary | ICD-10-CM | POA: Diagnosis not present

## 2022-03-31 DIAGNOSIS — R0603 Acute respiratory distress: Secondary | ICD-10-CM | POA: Diagnosis not present

## 2022-03-31 DIAGNOSIS — M6281 Muscle weakness (generalized): Secondary | ICD-10-CM | POA: Diagnosis not present

## 2022-04-05 DIAGNOSIS — S51001A Unspecified open wound of right elbow, initial encounter: Secondary | ICD-10-CM | POA: Diagnosis not present

## 2022-04-05 DIAGNOSIS — R0603 Acute respiratory distress: Secondary | ICD-10-CM | POA: Diagnosis not present

## 2022-04-07 ENCOUNTER — Telehealth: Payer: Self-pay | Admitting: Internal Medicine

## 2022-04-07 NOTE — Telephone Encounter (Signed)
Called patient to schedule Medicare Annual Wellness Visit (AWV). Left message for patient to call back and schedule Medicare Annual Wellness Visit (AWV).  Last date of AWV: 11/30/2020   Please schedule an appointment at any time with Denisa, [place].  If any questions, please contact me at [phone].    Thank you,  [place] Direct dial  [phone]

## 2022-04-08 NOTE — Telephone Encounter (Signed)
Patient's husband returned my call to schedule AWV. He said patient's in rehab at [facility] in [place]. She was admitted 20 days ago. He said it looks like it may be a long stay. He wanted to make sure that Dr. Nicki Reaper knew she's in rehab.

## 2022-04-09 NOTE — Telephone Encounter (Signed)
Called and left message for pts husband - to f/u on how [name] was doing.

## 2022-04-12 DIAGNOSIS — R0603 Acute respiratory distress: Secondary | ICD-10-CM | POA: Diagnosis not present

## 2022-04-12 DIAGNOSIS — D509 Iron deficiency anemia, unspecified: Secondary | ICD-10-CM | POA: Diagnosis not present

## 2022-04-12 DIAGNOSIS — I5032 Chronic diastolic (congestive) heart failure: Secondary | ICD-10-CM | POA: Diagnosis not present

## 2022-04-12 DIAGNOSIS — M6281 Muscle weakness (generalized): Secondary | ICD-10-CM | POA: Diagnosis not present

## 2022-04-13 ENCOUNTER — Telehealth: Payer: Self-pay | Admitting: Internal Medicine

## 2022-04-13 NOTE — Telephone Encounter (Signed)
[name] from [facility] called stating the home health order is out of compliance  Order [phone]

## 2022-04-13 NOTE — Telephone Encounter (Signed)
Faxed

## 2022-04-20 DIAGNOSIS — B372 Candidiasis of skin and nail: Secondary | ICD-10-CM | POA: Diagnosis not present

## 2022-05-09 DIAGNOSIS — R059 Cough, unspecified: Secondary | ICD-10-CM | POA: Diagnosis not present

## 2022-05-09 DIAGNOSIS — J9811 Atelectasis: Secondary | ICD-10-CM | POA: Diagnosis not present

## 2022-05-09 DIAGNOSIS — R918 Other nonspecific abnormal finding of lung field: Secondary | ICD-10-CM | POA: Diagnosis not present

## 2022-05-09 DIAGNOSIS — I517 Cardiomegaly: Secondary | ICD-10-CM | POA: Diagnosis not present

## 2022-05-09 DIAGNOSIS — J90 Pleural effusion, not elsewhere classified: Secondary | ICD-10-CM | POA: Diagnosis not present

## 2022-05-11 ENCOUNTER — Inpatient Hospital Stay
Admission: EM | Admit: 2022-05-11 | Discharge: 2022-05-19 | DRG: 291 | Disposition: A | Discharge status: Skilled Nursing Facility | Payer: Medicare HMO | Source: Skilled Nursing Facility | Attending: Internal Medicine | Admitting: Internal Medicine

## 2022-05-11 DIAGNOSIS — Z8042 Family history of malignant neoplasm of prostate: Secondary | ICD-10-CM

## 2022-05-11 DIAGNOSIS — I110 Hypertensive heart disease with heart failure: Principal | ICD-10-CM | POA: Diagnosis present

## 2022-05-11 DIAGNOSIS — I5033 Acute on chronic diastolic (congestive) heart failure: Secondary | ICD-10-CM | POA: Diagnosis not present

## 2022-05-11 DIAGNOSIS — J9611 Chronic respiratory failure with hypoxia: Secondary | ICD-10-CM | POA: Diagnosis not present

## 2022-05-11 DIAGNOSIS — Z90710 Acquired absence of both cervix and uterus: Secondary | ICD-10-CM

## 2022-05-11 DIAGNOSIS — E7800 Pure hypercholesterolemia, unspecified: Secondary | ICD-10-CM | POA: Diagnosis present

## 2022-05-11 DIAGNOSIS — J189 Pneumonia, unspecified organism: Secondary | ICD-10-CM | POA: Diagnosis not present

## 2022-05-11 DIAGNOSIS — R0902 Hypoxemia: Secondary | ICD-10-CM

## 2022-05-11 DIAGNOSIS — R918 Other nonspecific abnormal finding of lung field: Secondary | ICD-10-CM | POA: Diagnosis not present

## 2022-05-11 DIAGNOSIS — N3000 Acute cystitis without hematuria: Secondary | ICD-10-CM | POA: Diagnosis present

## 2022-05-11 DIAGNOSIS — R0603 Acute respiratory distress: Secondary | ICD-10-CM | POA: Diagnosis present

## 2022-05-11 DIAGNOSIS — J90 Pleural effusion, not elsewhere classified: Secondary | ICD-10-CM

## 2022-05-11 DIAGNOSIS — E871 Hypo-osmolality and hyponatremia: Secondary | ICD-10-CM | POA: Diagnosis present

## 2022-05-11 DIAGNOSIS — I10 Essential (primary) hypertension: Secondary | ICD-10-CM | POA: Diagnosis not present

## 2022-05-11 DIAGNOSIS — I482 Chronic atrial fibrillation, unspecified: Secondary | ICD-10-CM | POA: Diagnosis present

## 2022-05-11 DIAGNOSIS — Z1152 Encounter for screening for COVID-19: Secondary | ICD-10-CM | POA: Diagnosis not present

## 2022-05-11 DIAGNOSIS — Z85828 Personal history of other malignant neoplasm of skin: Secondary | ICD-10-CM

## 2022-05-11 DIAGNOSIS — Z833 Family history of diabetes mellitus: Secondary | ICD-10-CM

## 2022-05-11 DIAGNOSIS — K219 Gastro-esophageal reflux disease without esophagitis: Secondary | ICD-10-CM | POA: Diagnosis present

## 2022-05-11 DIAGNOSIS — M419 Scoliosis, unspecified: Secondary | ICD-10-CM | POA: Diagnosis present

## 2022-05-11 DIAGNOSIS — K2270 Barrett's esophagus without dysplasia: Secondary | ICD-10-CM | POA: Diagnosis present

## 2022-05-11 DIAGNOSIS — I959 Hypotension, unspecified: Secondary | ICD-10-CM | POA: Diagnosis not present

## 2022-05-11 DIAGNOSIS — Z7901 Long term (current) use of anticoagulants: Secondary | ICD-10-CM

## 2022-05-11 DIAGNOSIS — Z803 Family history of malignant neoplasm of breast: Secondary | ICD-10-CM

## 2022-05-11 DIAGNOSIS — D509 Iron deficiency anemia, unspecified: Secondary | ICD-10-CM | POA: Diagnosis present

## 2022-05-11 DIAGNOSIS — R0689 Other abnormalities of breathing: Secondary | ICD-10-CM | POA: Diagnosis not present

## 2022-05-11 DIAGNOSIS — Z8041 Family history of malignant neoplasm of ovary: Secondary | ICD-10-CM

## 2022-05-11 DIAGNOSIS — J9811 Atelectasis: Secondary | ICD-10-CM | POA: Diagnosis not present

## 2022-05-11 DIAGNOSIS — J9601 Acute respiratory failure with hypoxia: Secondary | ICD-10-CM | POA: Diagnosis present

## 2022-05-11 DIAGNOSIS — Z83438 Family history of other disorder of lipoprotein metabolism and other lipidemia: Secondary | ICD-10-CM

## 2022-05-11 DIAGNOSIS — E876 Hypokalemia: Secondary | ICD-10-CM | POA: Diagnosis not present

## 2022-05-11 DIAGNOSIS — Z7401 Bed confinement status: Secondary | ICD-10-CM

## 2022-05-11 DIAGNOSIS — H9190 Unspecified hearing loss, unspecified ear: Secondary | ICD-10-CM | POA: Diagnosis present

## 2022-05-11 DIAGNOSIS — Z8719 Personal history of other diseases of the digestive system: Secondary | ICD-10-CM

## 2022-05-11 DIAGNOSIS — K449 Diaphragmatic hernia without obstruction or gangrene: Secondary | ICD-10-CM | POA: Diagnosis present

## 2022-05-11 DIAGNOSIS — Z9049 Acquired absence of other specified parts of digestive tract: Secondary | ICD-10-CM

## 2022-05-11 DIAGNOSIS — I480 Paroxysmal atrial fibrillation: Secondary | ICD-10-CM | POA: Diagnosis not present

## 2022-05-11 DIAGNOSIS — I739 Peripheral vascular disease, unspecified: Secondary | ICD-10-CM | POA: Diagnosis present

## 2022-05-11 DIAGNOSIS — R000 Tachycardia, unspecified: Secondary | ICD-10-CM | POA: Diagnosis not present

## 2022-05-11 DIAGNOSIS — B952 Enterococcus as the cause of diseases classified elsewhere: Secondary | ICD-10-CM | POA: Diagnosis present

## 2022-05-11 DIAGNOSIS — J9809 Other diseases of bronchus, not elsewhere classified: Secondary | ICD-10-CM | POA: Diagnosis not present

## 2022-05-11 DIAGNOSIS — I2489 Other forms of acute ischemic heart disease: Secondary | ICD-10-CM | POA: Diagnosis present

## 2022-05-11 DIAGNOSIS — Z885 Allergy status to narcotic agent status: Secondary | ICD-10-CM

## 2022-05-11 DIAGNOSIS — R0602 Shortness of breath: Secondary | ICD-10-CM | POA: Diagnosis not present

## 2022-05-11 DIAGNOSIS — R262 Difficulty in walking, not elsewhere classified: Secondary | ICD-10-CM | POA: Diagnosis present

## 2022-05-11 DIAGNOSIS — Z9221 Personal history of antineoplastic chemotherapy: Secondary | ICD-10-CM

## 2022-05-11 DIAGNOSIS — Z86711 Personal history of pulmonary embolism: Secondary | ICD-10-CM

## 2022-05-11 DIAGNOSIS — Z66 Do not resuscitate: Secondary | ICD-10-CM | POA: Diagnosis present

## 2022-05-11 DIAGNOSIS — J9621 Acute and chronic respiratory failure with hypoxia: Secondary | ICD-10-CM | POA: Diagnosis not present

## 2022-05-11 DIAGNOSIS — Z7189 Other specified counseling: Secondary | ICD-10-CM | POA: Diagnosis not present

## 2022-05-11 DIAGNOSIS — Z79899 Other long term (current) drug therapy: Secondary | ICD-10-CM

## 2022-05-11 DIAGNOSIS — I5043 Acute on chronic combined systolic (congestive) and diastolic (congestive) heart failure: Secondary | ICD-10-CM | POA: Diagnosis present

## 2022-05-11 DIAGNOSIS — R062 Wheezing: Secondary | ICD-10-CM | POA: Diagnosis not present

## 2022-05-11 DIAGNOSIS — Z825 Family history of asthma and other chronic lower respiratory diseases: Secondary | ICD-10-CM

## 2022-05-11 DIAGNOSIS — Z823 Family history of stroke: Secondary | ICD-10-CM

## 2022-05-11 DIAGNOSIS — Z85038 Personal history of other malignant neoplasm of large intestine: Secondary | ICD-10-CM

## 2022-05-11 DIAGNOSIS — B965 Pseudomonas (aeruginosa) (mallei) (pseudomallei) as the cause of diseases classified elsewhere: Secondary | ICD-10-CM | POA: Diagnosis present

## 2022-05-11 DIAGNOSIS — Z8701 Personal history of pneumonia (recurrent): Secondary | ICD-10-CM

## 2022-05-11 DIAGNOSIS — E119 Type 2 diabetes mellitus without complications: Secondary | ICD-10-CM | POA: Diagnosis not present

## 2022-05-11 DIAGNOSIS — J690 Pneumonitis due to inhalation of food and vomit: Secondary | ICD-10-CM | POA: Diagnosis not present

## 2022-05-11 DIAGNOSIS — E875 Hyperkalemia: Secondary | ICD-10-CM | POA: Diagnosis not present

## 2022-05-11 DIAGNOSIS — Z8616 Personal history of COVID-19: Secondary | ICD-10-CM | POA: Diagnosis not present

## 2022-05-11 DIAGNOSIS — R069 Unspecified abnormalities of breathing: Secondary | ICD-10-CM | POA: Diagnosis not present

## 2022-05-11 DIAGNOSIS — Z515 Encounter for palliative care: Secondary | ICD-10-CM

## 2022-05-11 DIAGNOSIS — N390 Urinary tract infection, site not specified: Secondary | ICD-10-CM | POA: Diagnosis not present

## 2022-05-11 DIAGNOSIS — J918 Pleural effusion in other conditions classified elsewhere: Secondary | ICD-10-CM | POA: Diagnosis not present

## 2022-05-11 DIAGNOSIS — I509 Heart failure, unspecified: Secondary | ICD-10-CM | POA: Diagnosis not present

## 2022-05-11 DIAGNOSIS — Z8249 Family history of ischemic heart disease and other diseases of the circulatory system: Secondary | ICD-10-CM

## 2022-05-11 NOTE — ED Provider Notes (Signed)
[facility] Provider Note    Event Date/Time   First MD Initiated Contact with Patient 05/11/22 2350     (approximate)   History   Respiratory Distress (From [facility], Hx of CHF with recent increased congestion and SHOB x 1 week. Recently increased Lasix and neb treatrments. Noted to be hypoxic on EMS arrival w NRB 15 L.)   HPI  [name] is a 89 y.o. female past medical history significant for HFpEF, atrial fibrillation and pulmonary embolism on Eliquis, hypertension, hyperlipidemia, presents to the emergency department with shortness of breath.  2 days of progressively worsening shortness of breath.  [facility]  Yesterday started doing increasing doses of her Lasix and DuoNeb treatments for shortness of breath.  Today EMS was called out for worsening shortness of breath, patient was found to be hypoxic with an SpO2 in the 70s.  Placed on 5 L nasal cannula with oxygen only improved to 89%.  Receiving a DuoNeb treatment on arrival.  Given IV Solu-Medrol 125 mg prior to arrival.  Last hospitalization was in January.  Confirmed with patient that she is DNR/DNI, she would be okay with BiPAP and other IV medications.     Physical Exam   Triage Vital Signs: ED Triage Vitals  Enc Vitals Group     BP      Pulse      Resp      Temp      Temp src      SpO2      Weight      Height      Head Circumference      [facility] Flow      Pain Score      Pain Loc      Pain Edu?      Excl. in [place]?     Most recent vital signs: Vitals:   05/12/22 0015 05/12/22 0030  BP:  (!) 154/53  Pulse:  94  Resp:  20  Temp: 98.1 F (36.7 C)   SpO2:  100%    Physical Exam Constitutional:      Appearance: She is well-developed.  HENT:     Head: Atraumatic.  Eyes:     Conjunctiva/sclera: Conjunctivae normal.  Cardiovascular:     Rate and Rhythm: Regular rhythm.  Pulmonary:     Effort: Respiratory distress present.     Breath  sounds: Rhonchi and rales present.     Comments: 5 L nasal cannula, tachypneic with increased work of breathing, speaking in one-word sentences.  Crackles to lower lung fields Abdominal:     General: There is no distension.  Musculoskeletal:        General: Normal range of motion.     Cervical back: Normal range of motion.     Right lower leg: No edema.     Left lower leg: No edema.  Skin:    General: Skin is warm.     Capillary Refill: Capillary refill takes less than 2 seconds.  Neurological:     Mental Status: She is alert. Mental status is at baseline.  Psychiatric:        Mood and Affect: Mood normal.     IMPRESSION / MDM / [place] / ED COURSE  I reviewed the triage vital signs and the nursing notes.  On chart review patient had a recent hospitalization in January where she was treated for pneumonia.  Received IV antibiotics and was ultimately  discharged to nursing facility.  Differential diagnosis including heart failure exacerbation, pneumonia, ACS, anemia, pulmonary embolism  On arrival patient with increased work of breathing, coarse breath sounds and sounds to be in a heart failure exacerbation.  Patient placed on BiPAP for comfort.  EKG  I, Nathaniel Man, the attending physician, personally viewed and interpreted this ECG.   Rate: 112  Rhythm: 8 fibrillation  Axis: Normal  Intervals: Normal  ST&T Change: None Multiple PVCs noted.  Atrial fibrillation while on cardiac telemetry.  RADIOLOGY I independently reviewed imaging, my interpretation of imaging: Chest x-ray with mild left lower lobe effusion.  No obvious findings of pneumonia.  Read as improved aeration with persistent small effusion.  Atelectasis noted.  LABS (all labs ordered are listed, but only abnormal results are displayed) Labs interpreted as -    Labs Reviewed  COMPREHENSIVE METABOLIC PANEL - Abnormal; Notable for the following components:      Result Value   Sodium 131 (*)     Chloride 94 (*)    Glucose, Bld 127 (*)    Albumin 3.2 (*)    All other components within normal limits  BRAIN NATRIURETIC PEPTIDE - Abnormal; Notable for the following components:   B Natriuretic Peptide 282.7 (*)    All other components within normal limits  CBC WITH DIFFERENTIAL/PLATELET - Abnormal; Notable for the following components:   WBC 11.4 (*)    RBC 2.85 (*)    Hemoglobin 7.3 (*)    HCT 24.3 (*)    MCH 25.6 (*)    RDW 15.9 (*)    Platelets 477 (*)    Neutro Abs 8.8 (*)    All other components within normal limits  BLOOD GAS, VENOUS - Abnormal; Notable for the following components:   pO2, Ven 56 (*)    Bicarbonate 29.2 (*)    Acid-Base Excess 4.0 (*)    All other components within normal limits  PROTIME-INR - Abnormal; Notable for the following components:   Prothrombin Time 23.5 (*)    INR 2.1 (*)    All other components within normal limits  TROPONIN I (HIGH SENSITIVITY) - Abnormal; Notable for the following components:   Troponin I (High Sensitivity) 21 (*)    All other components within normal limits  SARS CORONAVIRUS 2 BY RT PCR  LACTIC ACID, PLASMA     MDM  On reevaluation significant improvement of her work of breathing with BiPAP.  Given IV Lasix given concern for heart failure exacerbation given pleural effusion and elevated BNP.  Troponin mildly elevated.  No chest pain at this time.  Anemia however it is consistent with her baseline.  Does have an elevated INR at 2.1.  Mild hyponatremia which is likely in the setting of volume overload.  Leukocytosis but no other findings concerning for pneumonia.  No other infectious process.  Clinical picture is most concerning for acute on chronic heart failure exacerbation with acute hypoxic respiratory failure.  Consulted hospitalist for admission.   PROCEDURES:  Critical Care performed: yes  .Critical Care  Performed by: Nathaniel Man, MD Authorized by: Nathaniel Man, MD   Critical care provider  statement:    Critical care time (minutes):  30   Critical care time was exclusive of:  Separately billable procedures and treating other patients   Critical care was necessary to treat or prevent imminent or life-threatening deterioration of the following conditions:  Respiratory failure   Critical care was time spent personally by me on the following activities:  Development of treatment plan with patient or surrogate, discussions with consultants, evaluation of patient's response to treatment, examination of patient, ordering and review of laboratory studies, ordering and review of radiographic studies, ordering and performing treatments and interventions, pulse oximetry, re-evaluation of patient's condition and review of old charts   Patient's presentation is most consistent with acute presentation with potential threat to life or bodily function.   MEDICATIONS ORDERED IN ED: Medications  furosemide (LASIX) injection 20 mg (has no administration in time range)    FINAL CLINICAL IMPRESSION(S) / ED DIAGNOSES   Final diagnoses:  Respiratory distress  Hypoxia  Acute on chronic diastolic congestive heart failure ([place])     Rx / DC Orders   ED Discharge Orders     None        Note:  This document was prepared using Dragon voice recognition software and may include unintentional dictation errors.   Nathaniel Man, MD 05/12/22 [phone]

## 2022-05-12 ENCOUNTER — Emergency Department: Payer: Medicare HMO

## 2022-05-12 ENCOUNTER — Other Ambulatory Visit: Payer: Self-pay

## 2022-05-12 ENCOUNTER — Encounter: Payer: Self-pay | Admitting: Internal Medicine

## 2022-05-12 DIAGNOSIS — Z66 Do not resuscitate: Secondary | ICD-10-CM | POA: Diagnosis present

## 2022-05-12 DIAGNOSIS — I739 Peripheral vascular disease, unspecified: Secondary | ICD-10-CM | POA: Diagnosis present

## 2022-05-12 DIAGNOSIS — B965 Pseudomonas (aeruginosa) (mallei) (pseudomallei) as the cause of diseases classified elsewhere: Secondary | ICD-10-CM | POA: Diagnosis present

## 2022-05-12 DIAGNOSIS — E7800 Pure hypercholesterolemia, unspecified: Secondary | ICD-10-CM | POA: Diagnosis present

## 2022-05-12 DIAGNOSIS — R0603 Acute respiratory distress: Secondary | ICD-10-CM | POA: Diagnosis present

## 2022-05-12 DIAGNOSIS — R918 Other nonspecific abnormal finding of lung field: Secondary | ICD-10-CM | POA: Diagnosis not present

## 2022-05-12 DIAGNOSIS — R0602 Shortness of breath: Secondary | ICD-10-CM | POA: Diagnosis not present

## 2022-05-12 DIAGNOSIS — Z515 Encounter for palliative care: Secondary | ICD-10-CM | POA: Diagnosis not present

## 2022-05-12 DIAGNOSIS — Z7901 Long term (current) use of anticoagulants: Secondary | ICD-10-CM | POA: Diagnosis not present

## 2022-05-12 DIAGNOSIS — J690 Pneumonitis due to inhalation of food and vomit: Secondary | ICD-10-CM | POA: Diagnosis not present

## 2022-05-12 DIAGNOSIS — J918 Pleural effusion in other conditions classified elsewhere: Secondary | ICD-10-CM | POA: Diagnosis not present

## 2022-05-12 DIAGNOSIS — Z86711 Personal history of pulmonary embolism: Secondary | ICD-10-CM | POA: Insufficient documentation

## 2022-05-12 DIAGNOSIS — E876 Hypokalemia: Secondary | ICD-10-CM | POA: Diagnosis not present

## 2022-05-12 DIAGNOSIS — Z1152 Encounter for screening for COVID-19: Secondary | ICD-10-CM | POA: Diagnosis not present

## 2022-05-12 DIAGNOSIS — N390 Urinary tract infection, site not specified: Secondary | ICD-10-CM | POA: Diagnosis not present

## 2022-05-12 DIAGNOSIS — N3000 Acute cystitis without hematuria: Secondary | ICD-10-CM | POA: Diagnosis present

## 2022-05-12 DIAGNOSIS — I110 Hypertensive heart disease with heart failure: Secondary | ICD-10-CM | POA: Diagnosis present

## 2022-05-12 DIAGNOSIS — J9601 Acute respiratory failure with hypoxia: Secondary | ICD-10-CM | POA: Diagnosis not present

## 2022-05-12 DIAGNOSIS — R262 Difficulty in walking, not elsewhere classified: Secondary | ICD-10-CM | POA: Diagnosis present

## 2022-05-12 DIAGNOSIS — I5043 Acute on chronic combined systolic (congestive) and diastolic (congestive) heart failure: Secondary | ICD-10-CM | POA: Diagnosis present

## 2022-05-12 DIAGNOSIS — J90 Pleural effusion, not elsewhere classified: Secondary | ICD-10-CM | POA: Diagnosis not present

## 2022-05-12 DIAGNOSIS — I5033 Acute on chronic diastolic (congestive) heart failure: Secondary | ICD-10-CM | POA: Diagnosis not present

## 2022-05-12 DIAGNOSIS — I480 Paroxysmal atrial fibrillation: Secondary | ICD-10-CM | POA: Diagnosis not present

## 2022-05-12 DIAGNOSIS — J189 Pneumonia, unspecified organism: Secondary | ICD-10-CM | POA: Diagnosis not present

## 2022-05-12 DIAGNOSIS — Z8616 Personal history of COVID-19: Secondary | ICD-10-CM | POA: Diagnosis not present

## 2022-05-12 DIAGNOSIS — I2489 Other forms of acute ischemic heart disease: Secondary | ICD-10-CM | POA: Diagnosis present

## 2022-05-12 DIAGNOSIS — K449 Diaphragmatic hernia without obstruction or gangrene: Secondary | ICD-10-CM | POA: Diagnosis present

## 2022-05-12 DIAGNOSIS — K2270 Barrett's esophagus without dysplasia: Secondary | ICD-10-CM | POA: Diagnosis present

## 2022-05-12 DIAGNOSIS — J9611 Chronic respiratory failure with hypoxia: Secondary | ICD-10-CM | POA: Diagnosis not present

## 2022-05-12 DIAGNOSIS — M419 Scoliosis, unspecified: Secondary | ICD-10-CM | POA: Diagnosis present

## 2022-05-12 DIAGNOSIS — E871 Hypo-osmolality and hyponatremia: Secondary | ICD-10-CM | POA: Diagnosis present

## 2022-05-12 DIAGNOSIS — D509 Iron deficiency anemia, unspecified: Secondary | ICD-10-CM | POA: Diagnosis present

## 2022-05-12 DIAGNOSIS — E875 Hyperkalemia: Secondary | ICD-10-CM | POA: Diagnosis not present

## 2022-05-12 DIAGNOSIS — Z7189 Other specified counseling: Secondary | ICD-10-CM | POA: Diagnosis not present

## 2022-05-12 DIAGNOSIS — I482 Chronic atrial fibrillation, unspecified: Secondary | ICD-10-CM | POA: Diagnosis present

## 2022-05-12 DIAGNOSIS — J9811 Atelectasis: Secondary | ICD-10-CM | POA: Diagnosis not present

## 2022-05-12 DIAGNOSIS — B952 Enterococcus as the cause of diseases classified elsewhere: Secondary | ICD-10-CM | POA: Diagnosis present

## 2022-05-12 DIAGNOSIS — I10 Essential (primary) hypertension: Secondary | ICD-10-CM | POA: Diagnosis not present

## 2022-05-12 DIAGNOSIS — K219 Gastro-esophageal reflux disease without esophagitis: Secondary | ICD-10-CM | POA: Diagnosis present

## 2022-05-12 DIAGNOSIS — J9621 Acute and chronic respiratory failure with hypoxia: Secondary | ICD-10-CM | POA: Diagnosis not present

## 2022-05-12 LAB — COMPREHENSIVE METABOLIC PANEL
ALT: 9 U/L (ref 0–44)
AST: 15 U/L (ref 15–41)
Albumin: 3.2 g/dL — ABNORMAL LOW (ref 3.5–5.0)
Alkaline Phosphatase: 54 U/L (ref 38–126)
Anion gap: 11 (ref 5–15)
BUN: 20 mg/dL (ref 8–23)
CO2: 26 mmol/L (ref 22–32)
Calcium: 8.9 mg/dL (ref 8.9–10.3)
Chloride: 94 mmol/L — ABNORMAL LOW (ref 98–111)
Creatinine, Ser: 0.79 mg/dL (ref 0.44–1.00)
GFR, Estimated: >60 mL/min (ref >60)
Glucose, Bld: 127 mg/dL — ABNORMAL HIGH (ref 70–99)
Potassium: 3.7 mmol/L (ref 3.5–5.1)
Sodium: 131 mmol/L — ABNORMAL LOW (ref 135–145)
Total Bilirubin: 0.8 mg/dL (ref 0.3–1.2)
Total Protein: 6.8 g/dL (ref 6.5–8.1)

## 2022-05-12 LAB — CBC WITH DIFFERENTIAL/PLATELET
Abs Immature Granulocytes: 0.05 10*3/uL (ref 0.00–0.07)
Basophils Absolute: 0.1 10*3/uL (ref 0.0–0.1)
Basophils Relative: 0 %
Eosinophils Absolute: 0.1 10*3/uL (ref 0.0–0.5)
Eosinophils Relative: 1 %
HCT: 24.3 % — ABNORMAL LOW (ref 36.0–46.0)
Hemoglobin: 7.3 g/dL — ABNORMAL LOW (ref 12.0–15.0)
Immature Granulocytes: 0 %
Lymphocytes Relative: 12 %
Lymphs Abs: 1.4 10*3/uL (ref 0.7–4.0)
MCH: 25.6 pg — ABNORMAL LOW (ref 26.0–34.0)
MCHC: 30 g/dL (ref 30.0–36.0)
MCV: 85.3 fL (ref 80.0–100.0)
Monocytes Absolute: 1 10*3/uL (ref 0.1–1.0)
Monocytes Relative: 9 %
Neutro Abs: 8.8 10*3/uL — ABNORMAL HIGH (ref 1.7–7.7)
Neutrophils Relative %: 78 %
Platelets: 477 10*3/uL — ABNORMAL HIGH (ref 150–400)
RBC: 2.85 MIL/uL — ABNORMAL LOW (ref 3.87–5.11)
RDW: 15.9 % — ABNORMAL HIGH (ref 11.5–15.5)
WBC: 11.4 10*3/uL — ABNORMAL HIGH (ref 4.0–10.5)
nRBC: 0 % (ref 0.0–0.2)

## 2022-05-12 LAB — BRAIN NATRIURETIC PEPTIDE: B Natriuretic Peptide: 282.7 pg/mL — ABNORMAL HIGH (ref 0.0–100.0)

## 2022-05-12 LAB — BLOOD GAS, VENOUS
Acid-Base Excess: 4 mmol/L — ABNORMAL HIGH (ref 0.0–2.0)
Bicarbonate: 29.2 mmol/L — ABNORMAL HIGH (ref 20.0–28.0)
Delivery systems: POSITIVE
FIO2: 40 %
O2 Saturation: 88.3 %
Patient temperature: 37
pCO2, Ven: 45 mmHg (ref 44–60)
pH, Ven: 7.42 (ref 7.25–7.43)
pO2, Ven: 56 mmHg — ABNORMAL HIGH (ref 32–45)

## 2022-05-12 LAB — PROTIME-INR
INR: 2.1 — ABNORMAL HIGH (ref 0.8–1.2)
Prothrombin Time: 23.5 seconds — ABNORMAL HIGH (ref 11.4–15.2)

## 2022-05-12 LAB — SARS CORONAVIRUS 2 BY RT PCR: SARS Coronavirus 2 by RT PCR: NEGATIVE

## 2022-05-12 LAB — TROPONIN I (HIGH SENSITIVITY)
Troponin I (High Sensitivity): 20 ng/L — ABNORMAL HIGH (ref <18)
Troponin I (High Sensitivity): 21 ng/L — ABNORMAL HIGH (ref <18)

## 2022-05-12 LAB — LACTIC ACID, PLASMA: Lactic Acid, Venous: 0.9 mmol/L (ref 0.5–1.9)

## 2022-05-12 LAB — HEMOGLOBIN
Hemoglobin: 7.2 g/dL — ABNORMAL LOW (ref 12.0–15.0)
Hemoglobin: 8.5 g/dL — ABNORMAL LOW (ref 12.0–15.0)

## 2022-05-12 LAB — PREPARE RBC (CROSSMATCH)

## 2022-05-12 MED ORDER — TRAZODONE HCL 50 MG PO TABS
50.0000 mg | ORAL_TABLET | Freq: Every evening | ORAL | Status: DC | PRN
  Administered 2022-05-12 – 2022-05-13 (×2): 50 mg via ORAL
  Filled 2022-05-12 (×2): qty 1

## 2022-05-12 MED ORDER — OXYCODONE HCL 5 MG PO TABS: 5.0000 mg | ORAL_TABLET | ORAL | Status: DC | PRN

## 2022-05-12 MED ORDER — IPRATROPIUM-ALBUTEROL 0.5-2.5 (3) MG/3ML IN SOLN: 3.0000 mL | RESPIRATORY_TRACT | Status: DC | PRN

## 2022-05-12 MED ORDER — ALBUTEROL SULFATE (2.5 MG/3ML) 0.083% IN NEBU: 2.5000 mg | INHALATION_SOLUTION | RESPIRATORY_TRACT | Status: DC | PRN

## 2022-05-12 MED ORDER — GABAPENTIN 100 MG PO CAPS
200.0000 mg | ORAL_CAPSULE | Freq: Every day | ORAL | Status: DC
  Administered 2022-05-12 – 2022-05-18 (×7): 200 mg via ORAL
  Filled 2022-05-12 (×7): qty 2

## 2022-05-12 MED ORDER — SIMVASTATIN 20 MG PO TABS
10.0000 mg | ORAL_TABLET | Freq: Every day | ORAL | Status: DC
  Administered 2022-05-12 – 2022-05-18 (×7): 10 mg via ORAL
  Filled 2022-05-12 (×7): qty 1

## 2022-05-12 MED ORDER — ACETAMINOPHEN 325 MG PO TABS
650.0000 mg | ORAL_TABLET | Freq: Four times a day (QID) | ORAL | Status: DC | PRN
  Administered 2022-05-18: 650 mg via ORAL
  Filled 2022-05-12: qty 2

## 2022-05-12 MED ORDER — ACETAMINOPHEN 650 MG RE SUPP: 650.0000 mg | Freq: Four times a day (QID) | RECTAL | Status: DC | PRN

## 2022-05-12 MED ORDER — GUAIFENESIN 100 MG/5ML PO LIQD
5.0000 mL | ORAL | Status: DC | PRN
  Administered 2022-05-13 – 2022-05-18 (×4): 5 mL via ORAL
  Filled 2022-05-12 (×5): qty 10

## 2022-05-12 MED ORDER — APIXABAN 5 MG PO TABS
5.0000 mg | ORAL_TABLET | Freq: Two times a day (BID) | ORAL | Status: DC
  Administered 2022-05-12 – 2022-05-19 (×15): 5 mg via ORAL
  Filled 2022-05-12 (×15): qty 1

## 2022-05-12 MED ORDER — SODIUM CHLORIDE 0.9% IV SOLUTION
Freq: Once | INTRAVENOUS | Status: AC
  Filled 2022-05-12: qty 250

## 2022-05-12 MED ORDER — HYDRALAZINE HCL 20 MG/ML IJ SOLN: 10.0000 mg | INTRAMUSCULAR | Status: DC | PRN

## 2022-05-12 MED ORDER — ONDANSETRON HCL 4 MG/2ML IJ SOLN: 4.0000 mg | Freq: Four times a day (QID) | INTRAMUSCULAR | Status: DC | PRN

## 2022-05-12 MED ORDER — POTASSIUM CHLORIDE CRYS ER 20 MEQ PO TBCR
20.0000 meq | EXTENDED_RELEASE_TABLET | Freq: Every day | ORAL | Status: DC
  Administered 2022-05-12: 20 meq via ORAL
  Filled 2022-05-12 (×2): qty 1

## 2022-05-12 MED ORDER — PANTOPRAZOLE SODIUM 40 MG PO TBEC
40.0000 mg | DELAYED_RELEASE_TABLET | Freq: Two times a day (BID) | ORAL | Status: DC
  Administered 2022-05-12 – 2022-05-19 (×15): 40 mg via ORAL
  Filled 2022-05-12 (×15): qty 1

## 2022-05-12 MED ORDER — ONDANSETRON HCL 4 MG PO TABS: 4.0000 mg | ORAL_TABLET | Freq: Four times a day (QID) | ORAL | Status: DC | PRN

## 2022-05-12 MED ORDER — DOCUSATE SODIUM 100 MG PO CAPS
100.0000 mg | ORAL_CAPSULE | Freq: Two times a day (BID) | ORAL | Status: DC
  Administered 2022-05-12 – 2022-05-19 (×15): 100 mg via ORAL
  Filled 2022-05-12 (×15): qty 1

## 2022-05-12 MED ORDER — METOPROLOL TARTRATE 5 MG/5ML IV SOLN: 5.0000 mg | INTRAVENOUS | Status: DC | PRN

## 2022-05-12 MED ORDER — SENNOSIDES-DOCUSATE SODIUM 8.6-50 MG PO TABS: 1.0000 | ORAL_TABLET | Freq: Every evening | ORAL | Status: DC | PRN

## 2022-05-12 MED ORDER — FUROSEMIDE 10 MG/ML IJ SOLN
20.0000 mg | Freq: Once | INTRAMUSCULAR | Status: AC
  Administered 2022-05-12: 20 mg via INTRAVENOUS
  Filled 2022-05-12: qty 4

## 2022-05-12 MED ORDER — ALBUTEROL SULFATE HFA 108 (90 BASE) MCG/ACT IN AERS: 2.0000 | INHALATION_SPRAY | Freq: Four times a day (QID) | RESPIRATORY_TRACT | Status: DC | PRN

## 2022-05-12 MED ORDER — FUROSEMIDE 10 MG/ML IJ SOLN
20.0000 mg | Freq: Two times a day (BID) | INTRAMUSCULAR | Status: DC
  Administered 2022-05-12 (×2): 20 mg via INTRAVENOUS
  Filled 2022-05-12 (×2): qty 4
  Filled 2022-05-12: qty 2

## 2022-05-12 MED ORDER — METOPROLOL SUCCINATE ER 25 MG PO TB24
25.0000 mg | ORAL_TABLET | Freq: Every day | ORAL | Status: DC
  Administered 2022-05-12 – 2022-05-19 (×8): 25 mg via ORAL
  Filled 2022-05-12 (×8): qty 1

## 2022-05-12 NOTE — ED Notes (Signed)
Pt doing a great deal of mouth breathing.  [name] not tolerated. Placed pt on a mask at 3L and oxygen improving at 96%.

## 2022-05-12 NOTE — ED Notes (Signed)
Transport requested

## 2022-05-12 NOTE — Assessment & Plan Note (Signed)
Continue Eliquis for now

## 2022-05-12 NOTE — ED Notes (Signed)
Pt given dinner tray.

## 2022-05-12 NOTE — ED Notes (Signed)
Pt cleaned of urine and BM- mepilex and barrier cream applied to sacrum. Incontinence-related skin breakdown noted. Pt placed in hospital gown and socks and moved to hospital bed. Pt remains on 2 liter's [place]. Daughter is bedside. New purewick applied.

## 2022-05-12 NOTE — Assessment & Plan Note (Signed)
Secondary to CHF. ?

## 2022-05-12 NOTE — Progress Notes (Signed)
PHARMACY CONSULT NOTE - FOLLOW UP  Pharmacy Consult for Electrolyte Monitoring and Replacement   Recent Labs: Potassium (mmol/L)  Date Value  05/12/2022 3.7   Magnesium (mg/dL)  Date Value  03/09/2022 1.9   Calcium (mg/dL)  Date Value  05/12/2022 8.9   Albumin (g/dL)  Date Value  05/12/2022 3.2 (L)   Phosphorus (mg/dL)  Date Value  03/02/2022 3.8   Sodium (mmol/L)  Date Value  05/12/2022 131 (L)     Assessment: 3/28 Potassium, Calcium WNL Most recent Magnesium (03/09/22) and Phosphorus (03/02/22) WNL   Goal of Therapy:  Electrolytes WNL  Plan:  CTM electrolytes.  BMP and magnesium ordered with 3/29 AM Labs.  Alison Murray ,PharmD Clinical Pharmacist 05/12/2022 9:16 AM

## 2022-05-12 NOTE — Progress Notes (Signed)
Called down for respiratory distress patient. Patient came in through EMS on 6LPM with coarse breath sounds throughout. MD ordered bipap. Placed pt on 10/5 @ 40% with sats of 96%. Patient looks comfortable at this time.

## 2022-05-12 NOTE — Assessment & Plan Note (Signed)
Continue metoprolol. 

## 2022-05-12 NOTE — Assessment & Plan Note (Addendum)
History of blood loss anemia related to bruising from anticoagulation Hemoglobin 7.3, slightly below baseline Continue to monitor and transfuse as necessary versus IV iron Consider risk benefit of continued anticoagulation with goals of care     Latest Ref Rng & Units 05/12/2022   12:09 AM 03/09/2022    4:12 AM 03/07/2022    4:41 AM  CBC  WBC 4.0 - 10.5 K/uL 11.4  5.7    Hemoglobin 12.0 - 15.0 g/dL 7.3  7.8  8.1   Hematocrit 36.0 - 46.0 % 24.3  24.8  25.5   Platelets 150 - 400 K/uL 477  475

## 2022-05-12 NOTE — ED Notes (Signed)
Pt briefly took her simple mask off.  Oxygen dipped into upper 80's.  Mask placed back on pt and oxygen level improved--see vital signs

## 2022-05-12 NOTE — Assessment & Plan Note (Addendum)
Acute respiratory failure with hypoxia Symptomatic anemia O2 sat 70% on room air with EMS improving with BiPAP in the ED, now on O2 at 3 L via nasal cannula Possibly triggered by symptomatic anemia given hemoglobin 8.1>7.8>7.3 Received DuoNebs and Solu-Medrol and route and Lasix on arrival BNP 282, chest x-ray showing improvement from prior Low suspicion for acute PE as patient is on chronic anticoagulation, low suspicion for pneumonia given chest x-ray findings Will treat as CHF - IV Lasix - Continue metoprolol - Supplemental oxygen to keep sats over 94% - Daily weights with intake and output monitoring -Trend hemoglobin and transfuse if necessary - EF 50 to 55% October 2023, will not repeat given goals of care (patient DNR/DNI and was seen by palliative care in January)

## 2022-05-12 NOTE — Progress Notes (Signed)
PROGRESS NOTE    [name]  [id number] DOB: Sep 08, 1928 DOA: 05/11/2022 PCP: [name], MD   Brief Narrative:  89 year old female with history of CHF with preserved EF, A-fib, PE on Eliquis, HTN, GI bleed, hereditary hemochromatosis, colon cancer status postsurgical resection, GERD, ambulatory dysfunction comes to the ED for acute respiratory distress requiring 6 L nasal cannula.  Upon admission on room air she was hypoxic saturating as low as 70%.  Initially placed on BiPAP.  COVID swab was negative.  She did have COVID about 6 months ago.  In the ED patient was treated with IV diuretics   Assessment & Plan:  Principal Problem:   Acute on chronic heart failure with preserved ejection fraction (HCC) Active Problems:   Acute respiratory failure with hypoxia (HCC)   Paroxysmal atrial fibrillation (HCC)   Hyponatremia   Essential hypertension   Anemia, iron deficiency   Hereditary hemochromatosis (HCC)   PAD (peripheral artery disease) (HCC)   History of pulmonary embolism October 2023     Assessment and Plan: * Acute on chronic heart failure with preserved ejection fraction (HCC) Acute on chronic respiratory failure with hypoxia; 2L [place] at home Symptomatic anemia Suspicion for volume overload.  Received IV Lasix.  Daily weight, monitor electrolytes and replete as necessary.  - Echocardiogram in October 2023 showed EF of 50-55%. -Check procalcitonin -Trend troponin   Paroxysmal atrial fibrillation (HCC) Continue Eliquis and metoprolol Replete electrolytes as appropriate  Hyponatremia Secondary to volume overload  Essential hypertension Continue metoprolol.  IV as needed  Anemia, iron deficiency History of blood loss anemia related to bruising from anticoagulation Baseline hemoglobin is around 7.5.  Today 7.3. 1 unit PRBC transfusion   History of pulmonary embolism October 2023 This was presumed to be in the setting of COVID-19 pneumonia.  Continue Eliquis  for now  PAD (peripheral artery disease) (HCC) Continue simvastatin and apixaban   DVT prophylaxis: Eliquis Code Status: DNR Family Communication: Daughter at bedside  Status is: Inpatient Still volume overloaded requiring IV diuretics      Subjective: Seen and examined at bedside.  Resting comfortably.  Does not appear to be in acute distress.  Daughter at bedside, agreeable transfusion   Examination:  General exam: Appears calm and comfortable, 4 L nasal cannula Respiratory system: Bibasilar crackles Cardiovascular system: S1 & S2 heard, RRR. No JVD, murmurs, rubs, gallops or clicks.  Trace bilateral lower extremity pitting edema Gastrointestinal system: Abdomen is nondistended, soft and nontender. No organomegaly or masses felt. Normal bowel sounds heard. Central nervous system: Alert and oriented. No focal neurological deficits. Extremities: Symmetric 4 x 5 power. Skin: No rashes, lesions or ulcers Psychiatry: Judgement and insight appear normal. Mood & affect appropriate.     Objective: Vitals:   05/12/22 0600 05/12/22 0630 05/12/22 0645 05/12/22 0700  BP: (!) 155/55 (!) 117/50  (!) 168/72  Pulse: 82 85 77 77  Resp: (!) 30 19 (!) 23 19  Temp:      TempSrc:      SpO2: 99% (!) 85% 100% 98%  Weight:       No intake or output data in the 24 hours ending 05/12/22 0905 Filed Weights   05/12/22 0258  Weight: 66.4 kg     Data Reviewed:   CBC: Recent Labs  Lab 05/12/22 0009 05/12/22 0540  WBC 11.4*  --   NEUTROABS 8.8*  --   HGB 7.3* 7.2*  HCT 24.3*  --   MCV 85.3  --   PLT  477*  --    Basic Metabolic Panel: Recent Labs  Lab 05/12/22 0009  NA 131*  K 3.7  CL 94*  CO2 26  GLUCOSE 127*  BUN 20  CREATININE 0.79  CALCIUM 8.9   GFR: Estimated Creatinine Clearance: 40.9 mL/min (by C-G formula based on SCr of 0.79 mg/dL). Liver Function Tests: Recent Labs  Lab 05/12/22 0009  AST 15  ALT 9  ALKPHOS 54  BILITOT 0.8  PROT 6.8  ALBUMIN 3.2*    No results for input(s): "LIPASE", "AMYLASE" in the last 168 hours. No results for input(s): "AMMONIA" in the last 168 hours. Coagulation Profile: Recent Labs  Lab 05/12/22 0041  INR 2.1*   Cardiac Enzymes: No results for input(s): "CKTOTAL", "CKMB", "CKMBINDEX", "TROPONINI" in the last 168 hours. BNP (last 3 results) No results for input(s): "PROBNP" in the last 8760 hours. HbA1C: No results for input(s): "HGBA1C" in the last 72 hours. CBG: No results for input(s): "GLUCAP" in the last 168 hours. Lipid Profile: No results for input(s): "CHOL", "HDL", "LDLCALC", "TRIG", "CHOLHDL", "LDLDIRECT" in the last 72 hours. Thyroid Function Tests: No results for input(s): "TSH", "T4TOTAL", "FREET4", "T3FREE", "THYROIDAB" in the last 72 hours. Anemia Panel: No results for input(s): "VITAMINB12", "FOLATE", "FERRITIN", "TIBC", "IRON", "RETICCTPCT" in the last 72 hours. Sepsis Labs: Recent Labs  Lab 05/12/22 0009  LATICACIDVEN 0.9    Recent Results (from the past 240 hour(s))  SARS Coronavirus 2 by RT PCR (hospital order, performed in [facility] hospital lab) *cepheid single result test* Anterior Nasal Swab     Status: None   Collection Time: 05/12/22 12:09 AM   Specimen: Anterior Nasal Swab  Result Value Ref Range Status   SARS Coronavirus 2 by RT PCR NEGATIVE NEGATIVE Final    Comment: (NOTE) SARS-CoV-2 target nucleic acids are NOT DETECTED.  The SARS-CoV-2 RNA is generally detectable in upper and lower respiratory specimens during the acute phase of infection. The lowest concentration of SARS-CoV-2 viral copies this assay can detect is 250 copies / mL. A negative result does not preclude SARS-CoV-2 infection and should not be used as the sole basis for treatment or other patient management decisions.  A negative result may occur with improper specimen collection / handling, submission of specimen other than nasopharyngeal swab, presence of viral mutation(s) within the areas  targeted by this assay, and inadequate number of viral copies (<250 copies / mL). A negative result must be combined with clinical observations, patient history, and epidemiological information.  Fact Sheet for Patients:   https://www.patel.info/  Fact Sheet for Healthcare Providers: https://hall.com/  This test is not yet approved or  cleared by the [place] FDA and has been authorized for detection and/or diagnosis of SARS-CoV-2 by FDA under an Emergency Use Authorization (EUA).  This EUA will remain in effect (meaning this test can be used) for the duration of the COVID-19 declaration under Section 564(b)(1) of the Act, 21 U.S.C. section 360bbb-3(b)(1), unless the authorization is terminated or revoked sooner.  Performed at [facility], [address], [place], [place] [address]          Radiology Studies: DG Chest 1 View  Result Date: 05/12/2022 CLINICAL DATA:  Shortness of breath EXAM: PORTABLE CHEST 1 VIEW COMPARISON:  02/28/2022 FINDINGS: Cardiac shadow is enlarged but stable. Aortic calcifications are seen. The lungs are well aerated bilaterally. Persistent small effusion is noted on the left although improved when compare with the prior exam. Some linear atelectatic changes are noted bilaterally. No  bony abnormality is seen. IMPRESSION: Improved aeration in the left base with persistent small effusion. Mild basilar atelectasis is noted. Electronically Signed   By: [place] Catalina M.D.   On: 05/12/2022 00:21        Scheduled Meds:  apixaban  5 mg Oral BID   docusate sodium  100 mg Oral BID   furosemide  20 mg Intravenous BID   gabapentin  200 mg Oral QHS   metoprolol succinate  25 mg Oral Daily   pantoprazole  40 mg Oral BID   potassium chloride SA  20 mEq Oral Daily   simvastatin  10 mg Oral QHS   Continuous Infusions:   LOS: 0 days   Time spent= 35 mins    Jeraline Marcinek Arsenio Loader, MD [facility]  If 7PM-7AM, please contact night-coverage  05/12/2022, 9:05 AM

## 2022-05-12 NOTE — Assessment & Plan Note (Signed)
-   Continue Eliquis and metoprolol 

## 2022-05-12 NOTE — Assessment & Plan Note (Signed)
Continue simvastatin and apixaban

## 2022-05-12 NOTE — H&P (Addendum)
History and Physical    Patient: [name] [id number] DOB: 1929-02-04 DOA: 05/11/2022 DOS: the patient was seen and examined on 05/12/2022 PCP: [name], MD  Patient coming from: Home  Chief Complaint:  Chief Complaint  Patient presents with   Respiratory Distress    From [facility], Hx of CHF with recent increased congestion and SHOB x 1 week. Recently increased Lasix and neb treatrments. Noted to be hypoxic on EMS arrival w NRB 15 L.    HPI: [name] is a 89 y.o. female with medical history significant for diastolic CHF(EF 50 to [id number] 11/2021), A-fib, PE in the setting of COVID-19 pneumonia 11/2021 on Eliquis, HTN, GI bleed while on Eliquis, now on Xarelto, hereditary hemochromatosis, colon cancer s/p surgical resection and chemo, GERD, ambulatory dysfunction, incontinence, who presents to the ED from rehab by EMS in acute respiratory distress on 6 L O2.  Patient has been hospitalized a few times over the past 5 months with COVID-pneumonia, influenza B pneumonia, acute PE, CHF with her last hospitalization mid-January for aspiration pneumonia.  She is not currently on home O2.  On arrival of EMS O2 sat was in the 70s.  She received Lasix, DuoNebs and Solu-Medrol and route.  Due to increased work of breathing on arrival to the ED, she was transitioned to BiPAP. ED course and data review: BP 162/65 with respirations 22 and O2 sat of 100% on 6 L with otherwise normal vitals.  VBG on BiPAP FiO2 40% with pH 7.42 and pCO2 45.  Labs otherwise significant for WBC 11,400 with lactic acid 0.9.  Hemoglobin at baseline at 7.3.  Troponin 21, and BNP 282, which appears to be her baseline.  Sodium 131.  COVID-negative EKG, personally viewed and interpreted shows A-fib at 112 with nonspecific ST-T wave changes. Chest x-ray compared to 02/28/2022 showing improved aeration in the left base with persistent small effusion, and mild basilar atelectasis. Patient treated with IV Lasix 20 mg.   Patient reportedly had significant improvement with BiPAP and was transitioned to O2 via nasal cannula at 3 L with O2 sats at 96%.  Hospitalist consulted for admission.    Past Medical History:  Diagnosis Date   Anemia    Barrett's esophagus    BCC (basal cell carcinoma of skin) 03/07/2013   vertex scalp - NODULAR NODULAR PATTERN PATTERN   Bowel obstruction (HCC)    s/p adhesion resection   CHF (congestive heart failure) (HCC)    Chronic back pain    Colon cancer ([place]) 1988 and 1989   adenocarcinoma, s/p resection x  2 and chemo   Diverticulitis    GERD (gastroesophageal reflux disease)    H/O open leg wound    Hiatal hernia    Hypercholesteremia    Hypertension    Lumbar scoliosis    OA (osteoarthritis)    Peripheral neuropathy    Recurrent sinus infections    Renal cyst    S/P chemotherapy, time since greater than 12 weeks    colon cancer   Vertigo    Past Surgical History:  Procedure Laterality Date   ABDOMINAL HYSTERECTOMY  1982   adhesions resected     bowel obstruction   APPENDECTOMY     BREAST BIOPSY Left    negative 06/14/1985   CHOLECYSTECTOMY  1994   COLON RESECTION     x2.  s/p colon cancer   COLON SURGERY  1958 and 1989   For Colon Cancer   EXCISIONAL HEMORRHOIDECTOMY  with tubal ligation   EXPLORATORY LAPAROTOMY  1991   Secondary to SBO   Social History:  reports that she has never smoked. She has never used smokeless tobacco. She reports that she does not drink alcohol and does not use drugs.  Allergies  Allergen Reactions   Fentanyl Other (See Comments)    confusion    Family History  Problem Relation Age of Onset   Breast cancer Mother    Asthma Mother    Stroke Father    Hypertension Father    Diabetes Father    Ovarian cancer Sister        x2   Prostate cancer Brother    Hypertension Brother        x3   Heart disease Brother    Hypercholesterolemia Brother        x3   Diabetes Brother    Spina bifida Grandchild    Hematuria  Neg Hx    Kidney cancer Neg Hx    Kidney disease Neg Hx    Sickle cell trait Neg Hx    Tuberculosis Neg Hx     Prior to Admission medications   Medication Sig Start Date End Date Taking? Authorizing Provider  acetaminophen (TYLENOL) 500 MG tablet Take 500 mg by mouth every 6 (six) hours as needed.    [provider]  albuterol (VENTOLIN HFA) 108 (90 Base) MCG/ACT inhaler TAKE 2 PUFFS BY MOUTH EVERY 6 HOURS AS NEEDED FOR WHEEZE OR SHORTNESS OF BREATH Patient taking differently: Inhale 2 puffs into the lungs every 6 (six) hours as needed for wheezing or shortness of breath. TAKE 2 PUFFS BY MOUTH EVERY 6 HOURS AS NEEDED FOR WHEEZE OR SHORTNESS OF BREATH 11/12/21   [name], MD  apixaban (ELIQUIS) 5 MG TABS tablet Take 5 mg by mouth 2 (two) times daily. 01/31/22   [provider]  calcium carbonate (OSCAL) 1500 (600 Ca) MG TABS tablet Take 600 mg of elemental calcium by mouth 2 (two) times daily with a meal.    [provider]  Cholecalciferol (VITAMIN D3) 50 MCG (2000 UT) capsule Take 2,000 Units by mouth daily.    [provider]  docusate sodium (COLACE) 100 MG capsule Take 100 mg by mouth 2 (two) times daily.    [provider]  fexofenadine (ALLEGRA) 60 MG tablet TAKE 1 TABLET EVERY DAY Patient taking differently: Take 60 mg by mouth daily. 08/29/21   Kennyth Arnold, FNP  furosemide (LASIX) 20 MG tablet Take 1 tablet (20 mg total) by mouth daily. 12/11/21 02/28/22  Max Sane, MD  gabapentin (NEURONTIN) 100 MG capsule TAKE 2 CAPSULES AT BEDTIME Patient taking differently: Take 200 mg by mouth at bedtime. TAKE 2 CAPSULES AT BEDTIME 05/18/20   [name], MD  ipratropium (ATROVENT) 0.06 % nasal spray Place 2 sprays into both nostrils 4 (four) times daily. 02/14/22   [provider]  metoprolol succinate (TOPROL-XL) 25 MG 24 hr tablet TAKE 1 TABLET TWICE DAILY Patient taking differently: Take 25 mg by mouth daily. 01/16/22   [name], MD  pantoprazole (PROTONIX) 40 MG tablet Take 40 mg by mouth 2 (two) times daily. 11/29/21   [provider]  potassium chloride SA (KLOR-CON M) 20 MEQ tablet Take 1 tablet (20 mEq total) by mouth daily. 03/12/22   Sidney Ace, MD  simvastatin (ZOCOR) 10 MG tablet TAKE 1 TABLET AT BEDTIME 03/31/22   [name], MD  [employer] Josiah Lobo ([place]) [facility]  06/26/19  [provider]    Physical Exam: Vitals:   05/12/22 0007 05/12/22 0010 05/12/22 0015 05/12/22 0030  BP:  (!) 162/65  (!) 154/53  Pulse: 97   94  Resp: (!) 22   20  Temp:   98.1 F (36.7 C)   TempSrc:   Oral   SpO2: 100%   100%   Physical Exam Vitals and nursing note reviewed.  Constitutional:      General: She is sleeping.     Interventions: Face mask in place.  HENT:     Head: Normocephalic and atraumatic.  Cardiovascular:     Rate and Rhythm: Normal rate and regular rhythm.     Heart sounds: Normal heart sounds.  Pulmonary:     Effort: Tachypnea present.     Breath sounds: Normal breath sounds.  Abdominal:     Palpations: Abdomen is soft.     Tenderness: There is no abdominal tenderness.  Neurological:     Mental Status: She is lethargic.     Labs on Admission: I have personally reviewed following labs and imaging studies  CBC: Recent Labs  Lab 05/12/22 0009  WBC 11.4*  NEUTROABS 8.8*  HGB 7.3*  HCT 24.3*  MCV 85.3  PLT [id number]*   Basic Metabolic Panel: Recent Labs  Lab 05/12/22 0009  NA 131*  K 3.7  CL 94*  CO2 26  GLUCOSE 127*  BUN 20  CREATININE 0.79  CALCIUM 8.9   GFR: CrCl cannot be calculated (Unknown ideal weight.). Liver Function Tests: Recent Labs  Lab 05/12/22 0009  AST 15  ALT 9  ALKPHOS 54  BILITOT 0.8  PROT 6.8  ALBUMIN 3.2*   No results for input(s): "LIPASE", "AMYLASE" in the last 168 hours. No results for input(s): "AMMONIA" in the last 168 hours. Coagulation Profile: Recent Labs  Lab 05/12/22 0041  INR  2.1*   Cardiac Enzymes: No results for input(s): "CKTOTAL", "CKMB", "CKMBINDEX", "TROPONINI" in the last 168 hours. BNP (last 3 results) No results for input(s): "PROBNP" in the last 8760 hours. HbA1C: No results for input(s): "HGBA1C" in the last 72 hours. CBG: No results for input(s): "GLUCAP" in the last 168 hours. Lipid Profile: No results for input(s): "CHOL", "HDL", "LDLCALC", "TRIG", "CHOLHDL", "LDLDIRECT" in the last 72 hours. Thyroid Function Tests: No results for input(s): "TSH", "T4TOTAL", "FREET4", "T3FREE", "THYROIDAB" in the last 72 hours. Anemia Panel: No results for input(s): "VITAMINB12", "FOLATE", "FERRITIN", "TIBC", "IRON", "RETICCTPCT" in the last 72 hours. Urine analysis:    Component Value Date/Time   COLORURINE AMBER (A) 02/28/2022 1215   APPEARANCEUR CLOUDY (A) 02/28/2022 1215   LABSPEC 1.016 02/28/2022 1215   PHURINE 5.0 02/28/2022 1215   GLUCOSEU NEGATIVE 02/28/2022 1215   GLUCOSEU NEGATIVE 04/04/2018 1255   HGBUR MODERATE (A) 02/28/2022 1215   BILIRUBINUR NEGATIVE 02/28/2022 1215   KETONESUR NEGATIVE 02/28/2022 1215   PROTEINUR 30 (A) 02/28/2022 1215   UROBILINOGEN 0.2 04/04/2018 1255   NITRITE NEGATIVE 02/28/2022 1215   LEUKOCYTESUR NEGATIVE 02/28/2022 1215    Radiological Exams on Admission: DG Chest 1 View  Result Date: 05/12/2022 CLINICAL DATA:  Shortness of breath EXAM: PORTABLE CHEST 1 VIEW COMPARISON:  02/28/2022 FINDINGS: Cardiac shadow is enlarged but stable. Aortic calcifications are seen. The lungs are well aerated bilaterally. Persistent small effusion is noted on the left although improved when compare with the prior exam. Some linear atelectatic changes are noted bilaterally. No bony abnormality is seen. IMPRESSION: Improved aeration in the left base with persistent small effusion.  Mild basilar atelectasis is noted. Electronically Signed   By: Inez Catalina M.D.   On: 05/12/2022 00:21     Data Reviewed: Relevant notes from primary care  and specialist visits, past discharge summaries as available in EHR, including Care Everywhere. Prior diagnostic testing as pertinent to current admission diagnoses Updated medications and problem lists for reconciliation ED course, including vitals, labs, imaging, treatment and response to treatment Triage notes, nursing and pharmacy notes and ED provider's notes Notable results as noted in HPI   Assessment and Plan: * Acute on chronic heart failure with preserved ejection fraction (HCC) Acute respiratory failure with hypoxia Symptomatic anemia O2 sat 70% on room air with EMS improving with BiPAP in the ED, now on O2 at 3 L via nasal cannula Possibly triggered by symptomatic anemia given hemoglobin 8.1>7.8>7.3 Received DuoNebs and Solu-Medrol and route and Lasix on arrival BNP 282, chest x-ray showing improvement from prior Low suspicion for acute PE as patient is on chronic anticoagulation, low suspicion for pneumonia given chest x-ray findings Will treat as CHF - IV Lasix - Continue metoprolol - Supplemental oxygen to keep sats over 94% - Daily weights with intake and output monitoring -Trend hemoglobin and transfuse if necessary - EF 50 to 55% October 2023, will not repeat given goals of care (patient DNR/DNI and was seen by palliative care in January)   Paroxysmal atrial fibrillation (HCC) Continue Eliquis and metoprolol  Hyponatremia Secondary to CHF  Essential hypertension Continue metoprolol  Anemia, iron deficiency History of blood loss anemia related to bruising from anticoagulation Hemoglobin 7.3, slightly below baseline Continue to monitor and transfuse as necessary versus IV iron Consider risk benefit of continued anticoagulation with goals of care     Latest Ref Rng & Units 05/12/2022   12:09 AM 03/09/2022    4:12 AM 03/07/2022    4:41 AM  CBC  WBC 4.0 - 10.5 K/uL 11.4  5.7    Hemoglobin 12.0 - 15.0 g/dL 7.3  7.8  8.1   Hematocrit 36.0 - 46.0 % 24.3  24.8   25.5   Platelets 150 - 400 K/uL 477  475        History of pulmonary embolism October 2023 Continue Eliquis for now  PAD (peripheral artery disease) (HCC) Continue simvastatin and apixaban        DVT prophylaxis: Eliquis  Consults: none  Advance Care Planning:   Code Status: DNR   Family Communication: none  Disposition Plan: Back to previous home environment  Severity of Illness: The appropriate patient status for this patient is INPATIENT. Inpatient status is judged to be reasonable and necessary in order to provide the required intensity of service to ensure the patient's safety. The patient's presenting symptoms, physical exam findings, and initial radiographic and laboratory data in the context of their chronic comorbidities is felt to place them at high risk for further clinical deterioration. Furthermore, it is not anticipated that the patient will be medically stable for discharge from the hospital within 2 midnights of admission.   * I certify that at the point of admission it is my clinical judgment that the patient will require inpatient hospital care spanning beyond 2 midnights from the point of admission due to high intensity of service, high risk for further deterioration and high frequency of surveillance required.*  Author: Athena Masse, MD 05/12/2022 2:12 AM  For on call review [email or web address].

## 2022-05-12 NOTE — ED Notes (Signed)
Pt was taken off of bipap by RT.

## 2022-05-13 DIAGNOSIS — I5033 Acute on chronic diastolic (congestive) heart failure: Secondary | ICD-10-CM | POA: Diagnosis not present

## 2022-05-13 LAB — BPAM RBC
Blood Product Expiration Date: 202404172359
ISSUE DATE / TIME: 202403281200
Unit Type and Rh: 7300

## 2022-05-13 LAB — TYPE AND SCREEN
ABO/RH(D): B POS
Antibody Screen: NEGATIVE
Unit division: 0

## 2022-05-13 LAB — CBC
HCT: 28.6 % — ABNORMAL LOW (ref 36.0–46.0)
Hemoglobin: 9.4 g/dL — ABNORMAL LOW (ref 12.0–15.0)
MCH: 27 pg (ref 26.0–34.0)
MCHC: 32.9 g/dL (ref 30.0–36.0)
MCV: 82.2 fL (ref 80.0–100.0)
Platelets: 460 10*3/uL — ABNORMAL HIGH (ref 150–400)
RBC: 3.48 MIL/uL — ABNORMAL LOW (ref 3.87–5.11)
RDW: 15.3 % (ref 11.5–15.5)
WBC: 15.2 10*3/uL — ABNORMAL HIGH (ref 4.0–10.5)
nRBC: 0 % (ref 0.0–0.2)

## 2022-05-13 LAB — BASIC METABOLIC PANEL
Anion gap: 10 (ref 5–15)
BUN: 28 mg/dL — ABNORMAL HIGH (ref 8–23)
CO2: 28 mmol/L (ref 22–32)
Calcium: 8.9 mg/dL (ref 8.9–10.3)
Chloride: 98 mmol/L (ref 98–111)
Creatinine, Ser: 0.7 mg/dL (ref 0.44–1.00)
GFR, Estimated: >60 mL/min (ref >60)
Glucose, Bld: 110 mg/dL — ABNORMAL HIGH (ref 70–99)
Potassium: 5.5 mmol/L — ABNORMAL HIGH (ref 3.5–5.1)
Sodium: 136 mmol/L (ref 135–145)

## 2022-05-13 LAB — MAGNESIUM: Magnesium: 2.4 mg/dL (ref 1.7–2.4)

## 2022-05-13 LAB — POTASSIUM: Potassium: 3.3 mmol/L — ABNORMAL LOW (ref 3.5–5.1)

## 2022-05-13 LAB — URINALYSIS, ROUTINE W REFLEX MICROSCOPIC
Bilirubin Urine: NEGATIVE
Glucose, UA: NEGATIVE mg/dL
Ketones, ur: NEGATIVE mg/dL
Nitrite: NEGATIVE
Protein, ur: NEGATIVE mg/dL
Specific Gravity, Urine: 1.008 (ref 1.005–1.030)
Squamous Epithelial / HPF: NONE SEEN /HPF (ref 0–5)
WBC, UA: >50 WBC/hpf (ref 0–5)
pH: 5 (ref 5.0–8.0)

## 2022-05-13 LAB — PROCALCITONIN: Procalcitonin: <0.1 ng/mL

## 2022-05-13 MED ORDER — POTASSIUM CHLORIDE CRYS ER 20 MEQ PO TBCR
40.0000 meq | EXTENDED_RELEASE_TABLET | Freq: Once | ORAL | Status: DC
  Filled 2022-05-13: qty 2

## 2022-05-13 MED ORDER — FUROSEMIDE 10 MG/ML IJ SOLN
40.0000 mg | Freq: Two times a day (BID) | INTRAMUSCULAR | Status: DC
  Administered 2022-05-13 – 2022-05-14 (×2): 40 mg via INTRAVENOUS
  Filled 2022-05-13 (×2): qty 4

## 2022-05-13 MED ORDER — FUROSEMIDE 10 MG/ML IJ SOLN
20.0000 mg | Freq: Once | INTRAMUSCULAR | Status: AC
  Administered 2022-05-13: 20 mg via INTRAVENOUS

## 2022-05-13 NOTE — Progress Notes (Signed)
PROGRESS NOTE    [name]  [id number] DOB: 1928-12-28 DOA: 05/11/2022 PCP: [name], MD   Brief Narrative:  89 year old female with history of CHF with preserved EF, A-fib, PE on Eliquis, HTN, GI bleed, hereditary hemochromatosis, colon cancer status postsurgical resection, GERD, ambulatory dysfunction comes to the ED for acute respiratory distress requiring 6 L nasal cannula.  Upon admission on room air she was hypoxic saturating as low as 70%.  Initially placed on BiPAP.  COVID swab was negative.  She did have COVID about 6 months ago.  In the ED patient was treated with IV diuretics.    Assessment & Plan:  Principal Problem:   Acute on chronic heart failure with preserved ejection fraction (HCC) Active Problems:   Acute respiratory failure with hypoxia (HCC)   Paroxysmal atrial fibrillation (HCC)   Hyponatremia   Essential hypertension   Anemia, iron deficiency   Hereditary hemochromatosis (HCC)   PAD (peripheral artery disease) (HCC)   History of pulmonary embolism October 2023     Assessment and Plan: * Acute on chronic heart failure with preserved ejection fraction (HCC) Acute on chronic respiratory failure with hypoxia; 2L [place] at home Symptomatic anemia Suspicion for volume overload.  Received IV Lasix.  Daily weight, monitor electrolytes and replete as necessary. Cont IV Lasix.  - Echocardiogram in October 2023 showed EF of 50-55%. -[place].   Leukocytosis -No obvious signs of inf. Will check UA and ProCal.   HyperKalemia > hypokalemia Lab error.  Repeat shows hypokalemia   Paroxysmal atrial fibrillation (HCC) Continue Eliquis and metoprolol Replete electrolytes as appropriate  Hyponatremia Resolved.   Essential hypertension Continue metoprolol.  IV as needed  Anemia, iron deficiency History of blood loss anemia related to bruising from anticoagulation Baseline hemoglobin is around 7.5.  Hb 7.3 s/p 1U PRBC. Hb 9.4   History of  pulmonary embolism October 2023 This was presumed to be in the setting of COVID-19 pneumonia.  Continue Eliquis for now  PAD (peripheral artery disease) ([place]) Continue simvastatin and apixaban  Patient is mostly bedbound for the past 4-5 months at home.   DVT prophylaxis: Eliquis Code Status: DNR Family Communication: Husband at bedside  Status is: Inpatient Still volume overloaded requiring IV diuretics Eventually plans to go home.  Hopefully next 24-48 hours     Subjective: Seen at bedside.  Still shortness of breath with minimal exertion but improved compared to yesterday. Examination: Constitutional: Not in acute distress, elderly frail Respiratory: Bibasilar crackles Cardiovascular: Normal sinus rhythm, no rubs Abdomen: Nontender nondistended good bowel sounds Musculoskeletal: 2+ bilateral lower extremity pitting edema Skin: No rashes seen Neurologic: CN 2-12 grossly intact.  And nonfocal Psychiatric: Normal judgment and insight. Alert and oriented x 3. Normal mood.  Objective: Vitals:   05/13/22 0337 05/13/22 0523 05/13/22 0829 05/13/22 0837  BP: (!) 150/62 136/63  (!) 150/51  Pulse: 79 78  82  Resp: 18 18  16   Temp: 97.6 F (36.4 C) (!) 97.5 F (36.4 C)  98.2 F (36.8 C)  TempSrc:      SpO2: 93% 91%  90%  Weight:   60.8 kg     Intake/Output Summary (Last 24 hours) at 05/13/2022 0900 Last data filed at 05/13/2022 0831 Gross per 24 hour  Intake 50 ml  Output 950 ml  Net -900 ml   Filed Weights   05/12/22 0258 05/13/22 0829  Weight: 66.4 kg 60.8 kg     Data Reviewed:   CBC: Recent Labs  Lab  05/12/22 0009 05/12/22 0540 05/12/22 1744 05/13/22 0653  WBC 11.4*  --   --  15.2*  NEUTROABS 8.8*  --   --   --   HGB 7.3* 7.2* 8.5* 9.4*  HCT 24.3*  --   --  28.6*  MCV 85.3  --   --  82.2  PLT 477*  --   --  123456*   Basic Metabolic Panel: Recent Labs  Lab 05/12/22 0009 05/13/22 0653  NA 131* 136  K 3.7 5.5*  CL 94* 98  CO2 26 28  GLUCOSE 127*  110*  BUN 20 28*  CREATININE 0.79 0.70  CALCIUM 8.9 8.9  MG  --  2.4   GFR: Estimated Creatinine Clearance: 39.3 mL/min (by C-G formula based on SCr of 0.7 mg/dL). Liver Function Tests: Recent Labs  Lab 05/12/22 0009  AST 15  ALT 9  ALKPHOS 54  BILITOT 0.8  PROT 6.8  ALBUMIN 3.2*   No results for input(s): "LIPASE", "AMYLASE" in the last 168 hours. No results for input(s): "AMMONIA" in the last 168 hours. Coagulation Profile: Recent Labs  Lab 05/12/22 0041  INR 2.1*   Cardiac Enzymes: No results for input(s): "CKTOTAL", "CKMB", "CKMBINDEX", "TROPONINI" in the last 168 hours. BNP (last 3 results) No results for input(s): "PROBNP" in the last 8760 hours. HbA1C: No results for input(s): "HGBA1C" in the last 72 hours. CBG: No results for input(s): "GLUCAP" in the last 168 hours. Lipid Profile: No results for input(s): "CHOL", "HDL", "LDLCALC", "TRIG", "CHOLHDL", "LDLDIRECT" in the last 72 hours. Thyroid Function Tests: No results for input(s): "TSH", "T4TOTAL", "FREET4", "T3FREE", "THYROIDAB" in the last 72 hours. Anemia Panel: No results for input(s): "VITAMINB12", "FOLATE", "FERRITIN", "TIBC", "IRON", "RETICCTPCT" in the last 72 hours. Sepsis Labs: Recent Labs  Lab 05/12/22 0009  LATICACIDVEN 0.9    Recent Results (from the past 240 hour(s))  SARS Coronavirus 2 by RT PCR (hospital order, performed in [facility] hospital lab) *cepheid single result test* Anterior Nasal Swab     Status: None   Collection Time: 05/12/22 12:09 AM   Specimen: Anterior Nasal Swab  Result Value Ref Range Status   SARS Coronavirus 2 by RT PCR NEGATIVE NEGATIVE Final    Comment: (NOTE) SARS-CoV-2 target nucleic acids are NOT DETECTED.  The SARS-CoV-2 RNA is generally detectable in upper and lower respiratory specimens during the acute phase of infection. The lowest concentration of SARS-CoV-2 viral copies this assay can detect is 250 copies / mL. A negative result does not preclude  SARS-CoV-2 infection and should not be used as the sole basis for treatment or other patient management decisions.  A negative result may occur with improper specimen collection / handling, submission of specimen other than nasopharyngeal swab, presence of viral mutation(s) within the areas targeted by this assay, and inadequate number of viral copies (<250 copies / mL). A negative result must be combined with clinical observations, patient history, and epidemiological information.  Fact Sheet for Patients:   https://www.patel.info/  Fact Sheet for Healthcare Providers: https://hall.com/  This test is not yet approved or  cleared by the [place] FDA and has been authorized for detection and/or diagnosis of SARS-CoV-2 by FDA under an Emergency Use Authorization (EUA).  This EUA will remain in effect (meaning this test can be used) for the duration of the COVID-19 declaration under Section 564(b)(1) of the Act, 21 U.S.C. section 360bbb-3(b)(1), unless the authorization is terminated or revoked sooner.  Performed at [facility], [address] [place], [place] [address]          Radiology Studies: DG Chest 1 View  Result Date: 05/12/2022 CLINICAL DATA:  Shortness of breath EXAM: PORTABLE CHEST 1 VIEW COMPARISON:  02/28/2022 FINDINGS: Cardiac shadow is enlarged but stable. Aortic calcifications are seen. The lungs are well aerated bilaterally. Persistent small effusion is noted on the left although improved when compare with the prior exam. Some linear atelectatic changes are noted bilaterally. No bony abnormality is seen. IMPRESSION: Improved aeration in the left base with persistent small effusion. Mild basilar atelectasis is noted. Electronically Signed   By: Inez Catalina M.D.   On: 05/12/2022 00:21        Scheduled Meds:  apixaban  5 mg Oral BID   docusate sodium  100 mg Oral BID   furosemide  20 mg Intravenous BID    gabapentin  200 mg Oral QHS   metoprolol succinate  25 mg Oral Daily   pantoprazole  40 mg Oral BID   simvastatin  10 mg Oral QHS   Continuous Infusions:   LOS: 1 day   Time spent= 35 mins    Roan Miklos Arsenio Loader, MD [facility]  If 7PM-7AM, please contact night-coverage  05/13/2022, 9:00 AM

## 2022-05-13 NOTE — Progress Notes (Addendum)
PHARMACY CONSULT NOTE - FOLLOW UP  Pharmacy Consult for Electrolyte Monitoring and Replacement   Recent Labs: Potassium (mmol/L)  Date Value  05/13/2022 3.3 (L)   Magnesium (mg/dL)  Date Value  05/13/2022 2.4   Calcium (mg/dL)  Date Value  05/13/2022 8.9   Albumin (g/dL)  Date Value  05/12/2022 3.2 (L)   Phosphorus (mg/dL)  Date Value  03/02/2022 3.8   Sodium (mmol/L)  Date Value  05/13/2022 136     Assessment: 13 YOF with PMH of CHF, Afib, PE, HTN, GI bleed, and cancer presents to the ED with respiratory distress. Most recent K+ is 3.3, subtherapeutic. Pharmacy has been consulted to manage electrolytes.   Lasix increased from 20 mg IV BID to 40 mg IV BID. KCl 20 mEq daily has been discontinued.   Goal of Therapy:  Electrolytes WNL  Plan:  Give 40 mEq KCl x1  F/u with AM labs 0330 0800  Darrall Dears, PharmD Candidate 05/13/2022 12:45 PM

## 2022-05-13 NOTE — Progress Notes (Signed)
PHARMACY CONSULT NOTE - FOLLOW UP  Pharmacy Consult for Electrolyte Monitoring and Replacement   Recent Labs: Potassium (mmol/L)  Date Value  05/13/2022 5.5 (H)   Magnesium (mg/dL)  Date Value  05/13/2022 2.4   Calcium (mg/dL)  Date Value  05/13/2022 8.9   Albumin (g/dL)  Date Value  05/12/2022 3.2 (L)   Phosphorus (mg/dL)  Date Value  03/02/2022 3.8   Sodium (mmol/L)  Date Value  05/13/2022 136     Assessment: 89 year old female with PMH of CHF, afib, hx of PE, HTN, Gi bleed, and cancer presents to the ED with resp distress. K+ went from 3.3 > 5.5 (HEMOLYSIS)  Lasix 20 mg IV BID and currently receiving Kcl 20 mEq daily.    Goal of Therapy:  Electrolytes WNL  Plan:  No replacement needed at this time.  F/u with AM labs.   Oswald Hillock ,PharmD Clinical Pharmacist 05/13/2022 7:59 AM

## 2022-05-14 ENCOUNTER — Inpatient Hospital Stay: Payer: Medicare HMO

## 2022-05-14 DIAGNOSIS — I5033 Acute on chronic diastolic (congestive) heart failure: Secondary | ICD-10-CM | POA: Diagnosis not present

## 2022-05-14 LAB — BASIC METABOLIC PANEL
Anion gap: 12 (ref 5–15)
BUN: 24 mg/dL — ABNORMAL HIGH (ref 8–23)
CO2: 33 mmol/L — ABNORMAL HIGH (ref 22–32)
Calcium: 8.7 mg/dL — ABNORMAL LOW (ref 8.9–10.3)
Chloride: 89 mmol/L — ABNORMAL LOW (ref 98–111)
Creatinine, Ser: 0.72 mg/dL (ref 0.44–1.00)
GFR, Estimated: >60 mL/min (ref >60)
Glucose, Bld: 115 mg/dL — ABNORMAL HIGH (ref 70–99)
Potassium: 3.3 mmol/L — ABNORMAL LOW (ref 3.5–5.1)
Sodium: 134 mmol/L — ABNORMAL LOW (ref 135–145)

## 2022-05-14 LAB — BODY FLUID CELL COUNT WITH DIFFERENTIAL
Eos, Fluid: 0 %
Lymphs, Fluid: 80 %
Monocyte-Macrophage-Serous Fluid: 9 %
Neutrophil Count, Fluid: 11 %
Total Nucleated Cell Count, Fluid: 438 cu mm

## 2022-05-14 LAB — CBC
HCT: 31.4 % — ABNORMAL LOW (ref 36.0–46.0)
Hemoglobin: 9.8 g/dL — ABNORMAL LOW (ref 12.0–15.0)
MCH: 26.7 pg (ref 26.0–34.0)
MCHC: 31.2 g/dL (ref 30.0–36.0)
MCV: 85.6 fL (ref 80.0–100.0)
Platelets: 472 10*3/uL — ABNORMAL HIGH (ref 150–400)
RBC: 3.67 MIL/uL — ABNORMAL LOW (ref 3.87–5.11)
RDW: 15.3 % (ref 11.5–15.5)
WBC: 9.7 10*3/uL (ref 4.0–10.5)
nRBC: 0 % (ref 0.0–0.2)

## 2022-05-14 LAB — PROTEIN, PLEURAL OR PERITONEAL FLUID: Total protein, fluid: <3 g/dL

## 2022-05-14 LAB — LACTATE DEHYDROGENASE, PLEURAL OR PERITONEAL FLUID: LD, Fluid: 72 U/L — ABNORMAL HIGH (ref 3–23)

## 2022-05-14 LAB — MAGNESIUM: Magnesium: 1.9 mg/dL (ref 1.7–2.4)

## 2022-05-14 MED ORDER — SODIUM CHLORIDE 0.9 % IV SOLN
3.0000 g | Freq: Four times a day (QID) | INTRAVENOUS | Status: DC
  Administered 2022-05-14 – 2022-05-15 (×5): 3 g via INTRAVENOUS
  Filled 2022-05-14 (×6): qty 8

## 2022-05-14 MED ORDER — LIDOCAINE HCL (PF) 1 % IJ SOLN
10.0000 mL | Freq: Once | INTRAMUSCULAR | Status: AC
  Administered 2022-05-14: 10 mL via INTRADERMAL

## 2022-05-14 MED ORDER — METHYLPREDNISOLONE SODIUM SUCC 40 MG IJ SOLR
40.0000 mg | Freq: Every day | INTRAMUSCULAR | Status: DC
  Administered 2022-05-14 – 2022-05-16 (×3): 40 mg via INTRAVENOUS
  Filled 2022-05-14 (×3): qty 1

## 2022-05-14 MED ORDER — FUROSEMIDE 10 MG/ML IJ SOLN
40.0000 mg | Freq: Three times a day (TID) | INTRAMUSCULAR | Status: DC
  Administered 2022-05-14 – 2022-05-15 (×3): 40 mg via INTRAVENOUS
  Filled 2022-05-14 (×3): qty 4

## 2022-05-14 MED ORDER — IPRATROPIUM-ALBUTEROL 0.5-2.5 (3) MG/3ML IN SOLN
3.0000 mL | Freq: Three times a day (TID) | RESPIRATORY_TRACT | Status: DC
  Administered 2022-05-14 – 2022-05-15 (×4): 3 mL via RESPIRATORY_TRACT
  Filled 2022-05-14 (×4): qty 3

## 2022-05-14 MED ORDER — SODIUM CHLORIDE 0.9 % IV SOLN
500.0000 mg | INTRAVENOUS | Status: AC
  Administered 2022-05-14 – 2022-05-16 (×3): 500 mg via INTRAVENOUS
  Filled 2022-05-14 (×3): qty 5

## 2022-05-14 MED ORDER — POTASSIUM CHLORIDE CRYS ER 20 MEQ PO TBCR
40.0000 meq | EXTENDED_RELEASE_TABLET | Freq: Once | ORAL | Status: AC
  Administered 2022-05-14: 40 meq via ORAL
  Filled 2022-05-14: qty 2

## 2022-05-14 NOTE — Procedures (Signed)
PROCEDURE SUMMARY:  Successful US guided left thoracentesis. Yielded 400 mL of clear yellow fluid. Patient tolerated procedure well. No immediate complications. EBL = trace  Specimen was sent for labs.  Post procedure chest X-ray reveals no pneumothorax  Zac Torti S Tremar Wickens PA-C 05/14/2022 1:22 PM

## 2022-05-14 NOTE — Progress Notes (Signed)
PROGRESS NOTE    ANAYANCY POLAND  [id number] DOB: 07-31-1928 DOA: 05/11/2022 PCP: [name], MD   Brief Narrative:  89 year old female with history of CHF with preserved EF, A-fib, PE on Eliquis, HTN, GI bleed, hereditary hemochromatosis, colon cancer status postsurgical resection, GERD, ambulatory dysfunction comes to the ED for acute respiratory distress requiring 6 L nasal cannula.  Upon admission on room air she was hypoxic saturating as low as 70%.  Initially placed on BiPAP.  COVID swab was negative.  She did have COVID about 6 months ago.  In the ED patient was treated with IV diuretics. Due to worsening resp status, her lasix was increased. Added Abx for possible aspiration.    Assessment & Plan:  Principal Problem:   Acute on chronic heart failure with preserved ejection fraction (HCC) Active Problems:   Acute respiratory failure with hypoxia (HCC)   Paroxysmal atrial fibrillation (HCC)   Hyponatremia   Essential hypertension   Anemia, iron deficiency   Hereditary hemochromatosis (HCC)   PAD (peripheral artery disease) (HCC)   History of pulmonary embolism October 2023     Assessment and Plan: * Acute on chronic heart failure with preserved ejection fraction (HCC) Acute on chronic respiratory failure with hypoxia; 2L [place] at home Symptomatic anemia Suspicion for volume overload.  Pro cal neg. Lasix increased to 40mg  q8hrs. [facility] cardiology consulted. Check ABG - Echocardiogram in October 2023 showed EF of 50-55%. -Trops Flat.   Possible Urinary Tract infection Aspiration/parapneumonic effusion.  Will start ptn on Unasyn and Azithromycin Bronchodilators. IS/Flutter.  Speech and swallow eval.  Will consult IR to see if we can do Thoracentesis.   Hypokalemia Prn repletion.    Paroxysmal atrial fibrillation (HCC) Continue Eliquis and metoprolol Replete electrolytes as appropriate  Hyponatremia Resolved.   Essential hypertension Continue metoprolol.  IV as  needed  Anemia, iron deficiency History of blood loss anemia related to bruising from anticoagulation Baseline hemoglobin is around 7.5.  Hb 7.3 s/p 1U PRBC. Hb 9.4   History of pulmonary embolism October 2023 This was presumed to be in the setting of COVID-19 pneumonia.  Continue Eliquis for now  PAD (peripheral artery disease) ([place]) Continue simvastatin and apixaban  Patient is mostly bedbound for the past 4-5 months at home. Overall very sick given her age and coomorbidites    DVT prophylaxis: Eliquis Code Status: DNR Family Communication: Spouse at bedside.   Status is: Inpatient Still volume overloaded requiring IV diuretics Eventually plans to go home.  Hopefully next 48 hours     Subjective: Overnight patient had increasing respiratory distress requiring more IV Lasix. When I saw the patient at bedside she answers basic questions appropriately.  When asked she says she is not feeling short of breath but she is clearly hypoxic requiring 5+ liters nasal cannula  Examination: Constitutional: Not in acute distress, 7 L nasal cannula.  Elderly frail Respiratory: Bibasilar crackles or rhonchi Cardiovascular: Normal sinus rhythm, no rubs Abdomen: Nontender nondistended good bowel sounds Musculoskeletal: No edema noted Skin: No rashes seen Neurologic: CN 2-12 grossly intact.  And nonfocal Psychiatric: Alert to name and place Objective: Vitals:   05/13/22 1915 05/13/22 2318 05/14/22 0309 05/14/22 0600  BP: (!) 141/48 (!) 125/57 134/60   Pulse: 81 84 85   Resp: 18 16 18    Temp: 97.6 F (36.4 C) 97.6 F (36.4 C) (!) 97.4 F (36.3 C)   TempSrc: Oral     SpO2: 95% 93% 95% 95%  Weight:  Intake/Output Summary (Last 24 hours) at 05/14/2022 0857 Last data filed at 05/14/2022 0255 Gross per 24 hour  Intake 720 ml  Output 1300 ml  Net -580 ml   Filed Weights   05/12/22 0258 05/13/22 0829  Weight: 66.4 kg 60.8 kg     Data Reviewed:   CBC: Recent Labs   Lab 05/12/22 0009 05/12/22 0540 05/12/22 1744 05/13/22 0653 05/14/22 0335  WBC 11.4*  --   --  15.2* 9.7  NEUTROABS 8.8*  --   --   --   --   HGB 7.3* 7.2* 8.5* 9.4* 9.8*  HCT 24.3*  --   --  28.6* 31.4*  MCV 85.3  --   --  82.2 85.6  PLT 477*  --   --  460* [id number]*   Basic Metabolic Panel: Recent Labs  Lab 05/12/22 0009 05/13/22 0653 05/13/22 0929 05/14/22 0335  NA 131* 136  --  134*  K 3.7 5.5* 3.3* 3.3*  CL 94* 98  --  89*  CO2 26 28  --  33*  GLUCOSE 127* 110*  --  115*  BUN 20 28*  --  24*  CREATININE 0.79 0.70  --  0.72  CALCIUM 8.9 8.9  --  8.7*  MG  --  2.4  --  1.9   GFR: Estimated Creatinine Clearance: 39.3 mL/min (by C-G formula based on SCr of 0.72 mg/dL). Liver Function Tests: Recent Labs  Lab 05/12/22 0009  AST 15  ALT 9  ALKPHOS 54  BILITOT 0.8  PROT 6.8  ALBUMIN 3.2*   No results for input(s): "LIPASE", "AMYLASE" in the last 168 hours. No results for input(s): "AMMONIA" in the last 168 hours. Coagulation Profile: Recent Labs  Lab 05/12/22 0041  INR 2.1*   Cardiac Enzymes: No results for input(s): "CKTOTAL", "CKMB", "CKMBINDEX", "TROPONINI" in the last 168 hours. BNP (last 3 results) No results for input(s): "PROBNP" in the last 8760 hours. HbA1C: No results for input(s): "HGBA1C" in the last 72 hours. CBG: No results for input(s): "GLUCAP" in the last 168 hours. Lipid Profile: No results for input(s): "CHOL", "HDL", "LDLCALC", "TRIG", "CHOLHDL", "LDLDIRECT" in the last 72 hours. Thyroid Function Tests: No results for input(s): "TSH", "T4TOTAL", "FREET4", "T3FREE", "THYROIDAB" in the last 72 hours. Anemia Panel: No results for input(s): "VITAMINB12", "FOLATE", "FERRITIN", "TIBC", "IRON", "RETICCTPCT" in the last 72 hours. Sepsis Labs: Recent Labs  Lab 05/12/22 0009 05/13/22 0929  PROCALCITON  --  <0.10  LATICACIDVEN 0.9  --     Recent Results (from the past 240 hour(s))  SARS Coronavirus 2 by RT PCR (hospital order, performed in  [facility] hospital lab) *cepheid single result test* Anterior Nasal Swab     Status: None   Collection Time: 05/12/22 12:09 AM   Specimen: Anterior Nasal Swab  Result Value Ref Range Status   SARS Coronavirus 2 by RT PCR NEGATIVE NEGATIVE Final    Comment: (NOTE) SARS-CoV-2 target nucleic acids are NOT DETECTED.  The SARS-CoV-2 RNA is generally detectable in upper and lower respiratory specimens during the acute phase of infection. The lowest concentration of SARS-CoV-2 viral copies this assay can detect is 250 copies / mL. A negative result does not preclude SARS-CoV-2 infection and should not be used as the sole basis for treatment or other patient management decisions.  A negative result may occur with improper specimen collection / handling, submission of specimen other than nasopharyngeal swab, presence of viral mutation(s) within the areas targeted by this assay, and  inadequate number of viral copies (<250 copies / mL). A negative result must be combined with clinical observations, patient history, and epidemiological information.  Fact Sheet for Patients:   https://www.patel.info/  Fact Sheet for Healthcare Providers: https://hall.com/  This test is not yet approved or  cleared by the [place] FDA and has been authorized for detection and/or diagnosis of SARS-CoV-2 by FDA under an Emergency Use Authorization (EUA).  This EUA will remain in effect (meaning this test can be used) for the duration of the COVID-19 declaration under Section 564(b)(1) of the Act, 21 U.S.C. section 360bbb-3(b)(1), unless the authorization is terminated or revoked sooner.  Performed at [facility], [address] [place], [place] [address]          Radiology Studies: [facility] Chest [place] 1 View  Result Date: 05/14/2022 CLINICAL DATA:  89 year old female with history of hypoxia. EXAM: PORTABLE CHEST 1 VIEW COMPARISON:  Chest x-ray  05/12/2022. FINDINGS: Worsening aeration throughout the left mid to lower lung, concerning for progressive airspace consolidation. Moderate left pleural effusion. Right lung is clear. No right pleural effusion. No pneumothorax. Chronic bilateral apical pleural-parenchymal thickening (right-greater-than-left), similar to prior studies, presumably chronic post infectious or inflammatory scarring. No evidence of pulmonary edema. Heart size is mildly enlarged. Upper mediastinal contours are within normal limits. IMPRESSION: 1. Worsening aeration in the left lower lobe concerning for left lower lobe pneumonia with increasing moderate left parapneumonic pleural effusion. 2. Mild cardiomegaly. 3. Aortic atherosclerosis. Electronically Signed   By: Vinnie Langton M.D.   On: 05/14/2022 07:27        Scheduled Meds:  apixaban  5 mg Oral BID   docusate sodium  100 mg Oral BID   furosemide  40 mg Intravenous Q8H   gabapentin  200 mg Oral QHS   metoprolol succinate  25 mg Oral Daily   pantoprazole  40 mg Oral BID   potassium chloride  40 mEq Oral Once   potassium chloride  40 mEq Oral Once   simvastatin  10 mg Oral QHS   Continuous Infusions:  azithromycin       LOS: 2 days   Time spent= 35 mins    Aqeel Norgaard Arsenio Loader, MD [facility]  If 7PM-7AM, please contact night-coverage  05/14/2022, 8:57 AM

## 2022-05-14 NOTE — Evaluation (Addendum)
Clinical/Bedside Swallow Evaluation Patient Details  Name: [name] MRN: [id number] Date of Birth: [birth year]  Today's Date: 05/14/2022 Time: SLP Start Time (ACUTE ONLY): 52 SLP Stop Time (ACUTE ONLY): 1150 SLP Time Calculation (min) (ACUTE ONLY): 60 min  Past Medical History:  Past Medical History:  Diagnosis Date   Anemia    Barrett's esophagus    BCC (basal cell carcinoma of skin) 03/07/2013   vertex scalp - NODULAR NODULAR PATTERN PATTERN   Bowel obstruction (HCC)    s/p adhesion resection   CHF (congestive heart failure) (HCC)    Chronic back pain    Colon cancer ([place]) 1988 and 1989   adenocarcinoma, s/p resection x  2 and chemo   Diverticulitis    GERD (gastroesophageal reflux disease)    H/O open leg wound    Hiatal hernia    Hypercholesteremia    Hypertension    Lumbar scoliosis    OA (osteoarthritis)    Peripheral neuropathy    Recurrent sinus infections    Renal cyst    S/P chemotherapy, time since greater than 12 weeks    colon cancer   Vertigo    Past Surgical History:  Past Surgical History:  Procedure Laterality Date   ABDOMINAL HYSTERECTOMY  1982   adhesions resected     bowel obstruction   APPENDECTOMY     BREAST BIOPSY Left    negative 06/14/1985   CHOLECYSTECTOMY  1994   COLON RESECTION     x2.  s/p colon cancer   COLON SURGERY  1958 and 1989   For Colon Cancer   EXCISIONAL HEMORRHOIDECTOMY     with tubal ligation   EXPLORATORY LAPAROTOMY  1991   Secondary to SBO   HPI:  Pt is a 89 y.o. female with medical history significant for Multiple medical issues including Barrett's Esophagus; Large HIatal Hernia, diastolic CHF(EF 50 to [id number] 11/2021), A-fib, PE in the setting of COVID-19 pneumonia 11/2021 on Eliquis, HTN, GI bleed while on Eliquis, now on Xarelto, hereditary hemochromatosis, colon cancer s/p surgical resection and chemo, GERD, ambulatory dysfunction, incontinence, who presents to the ED from rehab by EMS in acute respiratory  distress on 6 L O2.  Patient has been hospitalized a few times over the past 5 months with COVID-pneumonia, influenza B pneumonia, acute PE, CHF with her last hospitalization mid-January for aspiration pneumonia.  She is not currently on home O2.  On arrival of EMS O2 sat was in the 70s.  She received Lasix, DuoNebs and Solu-Medrol and route.  Due to increased work of breathing on arrival to the ED, she was transitioned to BiPAP.  Imaging today: No definite pneumothorax status post left thoracentesis. Minimal  blunting of the left costophrenic angle may reflect trace residual  pleural effusion.  2. Left basilar patchy opacities, likely atelectasis. Aspiration or  pneumonia can be considered in the appropriate clinical setting.  Large Hiatal Hernia documented.    Assessment / Plan / Recommendation  Clinical Impression    Pt seen for BSE this morning. Husband present and endorsed some information on pt's status at [facility]. Pt has recently been admitted there per last chart notes at 02/2022 admit. Pt A/O x3; engaged verbally to answer basic questions re: self and followed basic commands w/ cue. Min HOH(?). Intermittent, congested cough noted at Baseline and declined Pulmonary status/strength noted lying in bed. Husband stated pt was "very tired".  On [place] O2 support- 2L; afebrile, WBC wnl. Pt pending thoracentesis today per MD.  OF NOTE: Pt has a Baseline of a Large Hiatal Hernia w/ reports of belching and potential retrograde backflow of po's at times at home. Any such Esophageal phase Dysmotility can increase risk for aspiration of REFLUX material. Pt is also more bed/chair-bound and leans to her Left side chronically per chart notes last admit.   Pt appears to present w/ functional oropharyngeal phase swallowing w/ no immediate, overt oropharyngeal phase swallowing deficits nor sustained change/decline in respiratory status noted during po intake -- Husband reported same during breakfast meal today. Pt  appears at reduced risk for aspiration from an oropharyngeal phase standpoint when following general aspiration precautions.  HOWEVER, pt has a baseline presentation of LARGE Hiatal Hernia and REFLUX w/ Sedentary physical status at home in recent month. ANY Dysmotility or Regurgitation of Reflux material can increase risk for aspiration of the Reflux material during Retrograde backflow thus impact Voicing and Pulmonary status, including aspiration pneumonia especially if more sedentary. Recommend getting out of bed for meals daily.    Pt sat upright in bed once positioned midline and head forward and consumed several trials of thin liquids Via Cup, purees, and softened solid foods w/ No immediate, overt clinical s/s of aspiration noted; clear vocal quality b/t trials, no decline in pulmonary status, no multiple swallows noted post initial pharyngeal swallow, no decline in O2 sats(97%). Intermittent mild, congested cough (x2) noted b/t trials -- similar to baseline cough but did NOT increase in intensity nor frequency as po trials continued. Oral phase appeared [facility] for bolus management and timely A-P transfer/clearing of material. Mastication adequate for boluses(softened) but min Time given b/t trials for pt to fully clear to overall effort. Rest breaks given for conservation of energy.  OM exam was [facility] for oral clearing; lingual/labial movements. No unilateral weakness. Speech clear.    Recommend a more Mech Soft diet w/ Meats MINCED (moistened foods) w/ thin liquids via Cup - NO straw d/t air swallowed, Esophageal phase Dysmotility. General aspiration precautions and support w/ feeding at meals d/t weakness. Rest Breaks during meals/oral intake to allow for Esophageal clearing and conservation of energy. STRICT REFLUX precautions strongly recommended to lessen chance for Regurgitation -- HOB elevated at night when sleeping.    Recommend pt f/u w/ GI for assessment/management and Education of Esophageal  phase Dysmotility as indicated. Discussion given on REFLUX, impact of Esophageal Dysmotility on swallowing and breathing, behaviors to manage dysmotility, and foods in diet.  Much education given to Husband and pt; MD on pt's swallowing and presentation/history per ST notes.  MD will continue to follow to discuss w/ Husband and pt. Strongly recommend f/u w/ Palliative Care services for discussion post D/C; [place] discussion.   MD to reconsult ST services if any new needs while admitted. NSG updated. Pt and Husband appreciative of Education information.  SLP Visit Diagnosis: Dysphagia, pharyngoesophageal phase (R13.14) (Esophageal phase Dysmotility; Large Hiatal Hernia)    Aspiration Risk  Mild aspiration risk;Risk for inadequate nutrition/hydration (reduced following precautions)    Diet Recommendation   a more Mech Soft diet w/ Meats MINCED (moistened foods) w/ thin liquids via Cup - NO straw d/t air swallowed, Esophageal phase Dysmotility. General aspiration precautions and support w/ feeding at meals d/t weakness. Rest Breaks during meals/oral intake to allow for Esophageal clearing and for conservation of energy. STRICT REFLUX precautions strongly recommended to lessen chance for Regurgitation -- HOB elevated at night when sleeping.   Medication Administration: Crushed with puree (vs whole in puree)  Other  Recommendations Recommended Consults: Consider GI evaluation;Consider esophageal assessment (Ed w/ family) Oral Care Recommendations: Oral care BID;Oral care before and after PO;Staff/trained caregiver to provide oral care (support)    Recommendations for follow up therapy are one component of a multi-disciplinary discharge planning process, led by the attending physician.  Recommendations may be updated based on patient status, additional functional criteria and insurance authorization.  Follow up Recommendations No SLP follow up      Assistance Recommended at Discharge  full   Functional Status Assessment Patient has not had a recent decline in their functional status  Frequency and Duration  (n/a)   (n/a)       Prognosis Prognosis for improved oropharyngeal function: Fair Barriers to Reach Goals: Time post onset;Severity of deficits Barriers/Prognosis Comment: Esophageal phase Dysmotility; Large Hiatal Hernia; pulmonary decline      Swallow Study   General Date of Onset: 05/13/22 HPI: Pt is a 89 y.o. female with medical history significant for Multiple medical issues including Barrett's Esophagus; Large HIatal Hernia, diastolic CHF(EF 50 to [id number] 11/2021), A-fib, PE in the setting of COVID-19 pneumonia 11/2021 on Eliquis, HTN, GI bleed while on Eliquis, now on Xarelto, hereditary hemochromatosis, colon cancer s/p surgical resection and chemo, GERD, ambulatory dysfunction, incontinence, who presents to the ED from rehab by EMS in acute respiratory distress on 6 L O2.  Patient has been hospitalized a few times over the past 5 months with COVID-pneumonia, influenza B pneumonia, acute PE, CHF with her last hospitalization mid-January for aspiration pneumonia.  She is not currently on home O2.  On arrival of EMS O2 sat was in the 70s.  She received Lasix, DuoNebs and Solu-Medrol and route.  Due to increased work of breathing on arrival to the ED, she was transitioned to BiPAP.  Imaging today: No definite pneumothorax status post left thoracentesis. Minimal  blunting of the left costophrenic angle may reflect trace residual  pleural effusion.  2. Left basilar patchy opacities, likely atelectasis. Aspiration or  pneumonia can be considered in the appropriate clinical setting.  Large Hiatal Hernia documented. Type of Study: Bedside Swallow Evaluation Previous Swallow Assessment: 02/2022; 12/2021 Diet Prior to this Study: Thin liquids (Level 0);Regular Temperature Spikes Noted: No (wbc 9.7) Respiratory Status: Nasal cannula (2L) History of Recent Intubation:  No Behavior/Cognition: Alert;Cooperative;Pleasant mood;Requires cueing (tired) Oral Cavity Assessment: Within Functional Limits Oral Care Completed by SLP: Recent completion by staff Oral Cavity - Dentition: Adequate natural dentition;Missing dentition (few) Vision: Functional for self-feeding Self-Feeding Abilities: Able to feed self;Needs assist;Needs set up;Total assist (held cup) Patient Positioning: Upright in bed (needed positioning) Baseline Vocal Quality: Low vocal intensity (reduced breath support) Volitional Cough: Congested Volitional Swallow: Able to elicit    Oral/Motor/Sensory Function Overall Oral Motor/Sensory Function: Within functional limits   Ice Chips Ice chips: Within functional limits Presentation: Spoon (fed; 3 trials)   Thin Liquid Thin Liquid: Within functional limits Presentation: Cup;Self Fed (10+ trials)    Nectar Thick Nectar Thick Liquid: Not tested   Honey Thick Honey Thick Liquid: Not tested   Puree Puree: Within functional limits Presentation: Spoon (fed; 6 trials)   Solid     Solid: Within functional limits Presentation:  (fed; 4 trials) Other Comments: moistened, cut small        Orinda Kenner, MS, CCC-SLP Speech Language Pathologist Rehab Services; [place] [phone] (ascom) Rin Gorton 05/14/2022,3:34 PM

## 2022-05-14 NOTE — Progress Notes (Signed)
PHARMACY CONSULT NOTE - FOLLOW UP  Pharmacy Consult for Electrolyte Monitoring and Replacement   Recent Labs: Potassium (mmol/L)  Date Value  05/14/2022 3.3 (L)   Magnesium (mg/dL)  Date Value  05/14/2022 1.9   Calcium (mg/dL)  Date Value  05/14/2022 8.7 (L)   Albumin (g/dL)  Date Value  05/12/2022 3.2 (L)   Phosphorus (mg/dL)  Date Value  03/02/2022 3.8   Sodium (mmol/L)  Date Value  05/14/2022 134 (L)     Assessment: 17 YOF with PMH of CHF, Afib, PE, HTN, GI bleed, and cancer presents to the ED with respiratory distress. Most recent K+ is 3.3, subtherapeutic. Pharmacy has been consulted to manage electrolytes.   Lasix increased from 20 mg IV BID to 40 mg IV BID on 3/29. KCl 20 mEq daily has been discontinued.   Goal of Therapy:  Electrolytes WNL  Plan:  K 3.3  Scr 0.72   will order 40 mEq KCl po x1 now  (Note that KCL po ordered 3/29 was not charted as given) F/u electrolytes with AM labs  Jonael Paradiso A, PharmD  05/14/2022 7:44 AM

## 2022-05-14 NOTE — Consult Note (Addendum)
CARDIOLOGY CONSULT NOTE               Patient ID: [name] MRN: [id number] DOB/AGE: [birth year] 89 y.o.  Admit date: 05/11/2022 Referring Physician Dr. Judd Gaudier hospitalist Primary Physician Dr. [name] primary Primary Cardiologist Dr. Saralyn Pilar Reason for Consultation shortness of breath dyspnea congestive heart failure  HPI: Patient is a 89 year old female multimedical problems including systolic diastolic congestive heart failure atrial fibrillation previous pulmonary embolus in the setting of COVID-19 pneumonia hypertension previous GI bleed currently on Xarelto for anticoagulation related to hemochromatosis history of colon cancer previous chemo GERD ambulatory dysfunction chronic respiratory failure with hypoxemia on home O2 presenting with respiratory distress weakness fatigue requiring aggressive respiratory management and care including BiPAP patient denies any chest pain found to possibly have pneumonia on chest x-ray as well as a pleural effusion appears to be resting comfortably has had some hypercapnia as well.  Patient somewhat improved now on supplemental oxygen  Review of systems complete and found to be negative unless listed above     Past Medical History:  Diagnosis Date   Anemia    Barrett's esophagus    BCC (basal cell carcinoma of skin) 03/07/2013   vertex scalp - NODULAR NODULAR PATTERN PATTERN   Bowel obstruction (HCC)    s/p adhesion resection   CHF (congestive heart failure) (HCC)    Chronic back pain    Colon cancer ([facility]) 1988 and 1989   adenocarcinoma, s/p resection x  2 and chemo   Diverticulitis    GERD (gastroesophageal reflux disease)    H/O open leg wound    Hiatal hernia    Hypercholesteremia    Hypertension    Lumbar scoliosis    OA (osteoarthritis)    Peripheral neuropathy    Recurrent sinus infections    Renal cyst    S/P chemotherapy, time since greater than 12 weeks    colon cancer   Vertigo     Past Surgical  History:  Procedure Laterality Date   ABDOMINAL HYSTERECTOMY  1982   adhesions resected     bowel obstruction   APPENDECTOMY     BREAST BIOPSY Left    negative 06/14/1985   CHOLECYSTECTOMY  1994   COLON RESECTION     x2.  s/p colon cancer   COLON SURGERY  1958 and 1989   For Colon Cancer   EXCISIONAL HEMORRHOIDECTOMY     with tubal ligation   EXPLORATORY LAPAROTOMY  1991   Secondary to SBO    Medications Prior to Admission  Medication Sig Dispense Refill Last Dose   acetaminophen (TYLENOL) 500 MG tablet Take 500 mg by mouth every 6 (six) hours as needed.   unk at unk   apixaban (ELIQUIS) 5 MG TABS tablet Take 5 mg by mouth 2 (two) times daily.   05/11/2022 at 1800   calcium carbonate (OSCAL) 1500 (600 Ca) MG TABS tablet Take 600 mg of elemental calcium by mouth 2 (two) times daily with a meal.   05/11/2022 at 1730   Cholecalciferol (VITAMIN D3) 50 MCG (2000 UT) capsule Take 2,000 Units by mouth daily.   05/11/2022   docusate sodium (COLACE) 100 MG capsule Take 100 mg by mouth 2 (two) times daily.   05/11/2022 at 1800   ferrous sulfate 325 (65 FE) MG EC tablet Take 325 mg by mouth daily with breakfast.   05/11/2022 at 0800   fexofenadine (ALLEGRA) 60 MG tablet TAKE 1 TABLET EVERY DAY (Patient taking differently: Take 60  mg by mouth daily.) 90 tablet 3 05/11/2022 at 0900   furosemide (LASIX) 20 MG tablet Take 1 tablet (20 mg total) by mouth daily. 30 tablet 0 05/11/2022 at 0900   gabapentin (NEURONTIN) 100 MG capsule TAKE 2 CAPSULES AT BEDTIME (Patient taking differently: Take 200 mg by mouth at bedtime. TAKE 2 CAPSULES AT BEDTIME) 180 capsule 1 05/11/2022 at 2000   guaiFENesin-dextromethorphan (ROBITUSSIN DM) 100-10 MG/5ML syrup Take 10 mLs by mouth every 4 (four) hours as needed for cough.   05/11/2022 at unk   ipratropium (ATROVENT) 0.06 % nasal spray Place 2 sprays into both nostrils 4 (four) times daily.   05/11/2022   ipratropium-albuterol (DUONEB) 0.5-2.5 (3) MG/3ML SOLN Take 3 mLs by  nebulization every 4 (four) hours as needed.   05/11/2022 at unk   metoprolol succinate (TOPROL-XL) 25 MG 24 hr tablet TAKE 1 TABLET TWICE DAILY (Patient taking differently: Take 25 mg by mouth in the morning and at bedtime.) 180 tablet 1 05/11/2022 at 1800   pantoprazole (PROTONIX) 40 MG tablet Take 40 mg by mouth 2 (two) times daily.   05/11/2022 at 1700   potassium chloride SA (KLOR-CON M) 20 MEQ tablet Take 1 tablet (20 mEq total) by mouth daily.   05/11/2022 at 0800   predniSONE (DELTASONE) 5 MG tablet Take 5 mg by mouth daily with breakfast.   05/11/2022 at 0900   simvastatin (ZOCOR) 10 MG tablet TAKE 1 TABLET AT BEDTIME 90 tablet 3 05/11/2022 at 2000   albuterol (VENTOLIN HFA) 108 (90 Base) MCG/ACT inhaler TAKE 2 PUFFS BY MOUTH EVERY 6 HOURS AS NEEDED FOR WHEEZE OR SHORTNESS OF BREATH (Patient not taking: Reported on 05/12/2022) 18 g 1 Not Taking   Social History   Socioeconomic History   Marital status: Married    Spouse name: Not on file   Number of children: 4   Years of education: 12th grade   Highest education level: Not on file  Occupational History   Occupation: homemaker  Tobacco Use   Smoking status: Never   Smokeless tobacco: Never  Vaping Use   Vaping Use: Never used  Substance and Sexual Activity   Alcohol use: No    Alcohol/week: 0.0 standard drinks of alcohol   Drug use: No   Sexual activity: Not Currently  Other Topics Concern   Not on file  Social History Narrative   Not on file   Social Determinants of Health   Financial Resource Strain: Low Risk  (11/30/2020)   Overall Financial Resource Strain (CARDIA)    Difficulty of Paying Living Expenses: Not hard at all  Food Insecurity: No Food Insecurity (05/12/2022)   Hunger Vital Sign    Worried About Running Out of Food in the Last Year: Never true    Ran Out of Food in the Last Year: Never true  Transportation Needs: No Transportation Needs (05/12/2022)   PRAPARE - Hydrologist  (Medical): No    Lack of Transportation (Non-Medical): No  Physical Activity: Unknown (11/30/2020)   Exercise Vital Sign    Days of Exercise per Week: 0 days    Minutes of Exercise per Session: Not on file  Stress: No Stress Concern Present (11/30/2020)   [place]    Feeling of Stress : Not at all  Social Connections: Unknown (11/30/2020)   Social Connection and Isolation Panel [NHANES]    Frequency of Communication with Friends and Family: More than three times  a week    Frequency of Social Gatherings with Friends and Family: More than three times a week    Attends Religious Services: Not on file    Active Member of Clubs or Organizations: Not on file    Attends Archivist Meetings: Not on file    Marital Status: Married  Intimate Partner Violence: Not At Risk (05/12/2022)   Humiliation, Afraid, Rape, and Kick questionnaire    Fear of Current or Ex-Partner: No    Emotionally Abused: No    Physically Abused: No    Sexually Abused: No    Family History  Problem Relation Age of Onset   Breast cancer Mother    Asthma Mother    Stroke Father    Hypertension Father    Diabetes Father    Ovarian cancer Sister        x2   Prostate cancer Brother    Hypertension Brother        x3   Heart disease Brother    Hypercholesterolemia Brother        x3   Diabetes Brother    Spina bifida Grandchild    Hematuria Neg Hx    Kidney cancer Neg Hx    Kidney disease Neg Hx    Sickle cell trait Neg Hx    Tuberculosis Neg Hx       Review of systems complete and found to be negative unless listed above      PHYSICAL EXAM  General: Well developed, well nourished, chronically ill-appearing mild respiratory distress HEENT:  Normocephalic and atramatic Neck:  No JVD.  Lungs: Diminished breath sounds bilaterally to auscultation and percussion. Heart: Irregularly irregular. Normal S1 and S2 without gallops or  murmurs.  Abdomen: Bowel sounds are positive, abdomen soft and non-tender  Msk:  Back normal, normal gait. Normal strength and tone for age. Extremities: No clubbing, cyanosis or edema.   Neuro: Alert and oriented X 3. Psych:  Good affect, responds appropriately  Labs:   Lab Results  Component Value Date   WBC 9.7 05/14/2022   HGB 9.8 (L) 05/14/2022   HCT 31.4 (L) 05/14/2022   MCV 85.6 05/14/2022   PLT 472 (H) 05/14/2022    Recent Labs  Lab 05/12/22 0009 05/13/22 0653 05/14/22 0335  NA 131*   < > 134*  K 3.7   < > 3.3*  CL 94*   < > 89*  CO2 26   < > 33*  BUN 20   < > 24*  CREATININE 0.79   < > 0.72  CALCIUM 8.9   < > 8.7*  PROT 6.8  --   --   BILITOT 0.8  --   --   ALKPHOS 54  --   --   ALT 9  --   --   AST 15  --   --   GLUCOSE 127*   < > 115*   < > = values in this interval not displayed.   Lab Results  Component Value Date   CKTOTAL 144 03/02/2022   TROPONINI <0.03 11/24/2015    Lab Results  Component Value Date   CHOL 156 09/21/2021   CHOL 160 08/24/2020   CHOL 149 12/24/2019   Lab Results  Component Value Date   HDL 60.20 09/21/2021   HDL 69.80 08/24/2020   HDL 67.80 12/24/2019   Lab Results  Component Value Date   LDLCALC 64 09/21/2021   LDLCALC 71 08/24/2020   LDLCALC 56 12/24/2019  Lab Results  Component Value Date   TRIG 161.0 (H) 09/21/2021   TRIG 94.0 08/24/2020   TRIG 126.0 12/24/2019   Lab Results  Component Value Date   CHOLHDL 3 09/21/2021   CHOLHDL 2 08/24/2020   CHOLHDL 2 12/24/2019   No results found for: "LDLDIRECT"    Radiology: [facility] Chest Port 1 View  Result Date: 05/14/2022 CLINICAL DATA:  89 year old female with history of hypoxia. EXAM: PORTABLE CHEST 1 VIEW COMPARISON:  Chest x-ray 05/12/2022. FINDINGS: Worsening aeration throughout the left mid to lower lung, concerning for progressive airspace consolidation. Moderate left pleural effusion. Right lung is clear. No right pleural effusion. No pneumothorax. Chronic  bilateral apical pleural-parenchymal thickening (right-greater-than-left), similar to prior studies, presumably chronic post infectious or inflammatory scarring. No evidence of pulmonary edema. Heart size is mildly enlarged. Upper mediastinal contours are within normal limits. IMPRESSION: 1. Worsening aeration in the left lower lobe concerning for left lower lobe pneumonia with increasing moderate left parapneumonic pleural effusion. 2. Mild cardiomegaly. 3. Aortic atherosclerosis. Electronically Signed   By: Vinnie Langton M.D.   On: 05/14/2022 07:27   DG Chest 1 View  Result Date: 05/12/2022 CLINICAL DATA:  Shortness of breath EXAM: PORTABLE CHEST 1 VIEW COMPARISON:  02/28/2022 FINDINGS: Cardiac shadow is enlarged but stable. Aortic calcifications are seen. The lungs are well aerated bilaterally. Persistent small effusion is noted on the left although improved when compare with the prior exam. Some linear atelectatic changes are noted bilaterally. No bony abnormality is seen. IMPRESSION: Improved aeration in the left base with persistent small effusion. Mild basilar atelectasis is noted. Electronically Signed   By: Inez Catalina M.D.   On: 05/12/2022 00:21    EKG: Atrial fibrillation very low voltage nonspecific ST-T wave changes  ASSESSMENT AND PLAN:  Acute on chronic congestive heart failure diastolic Acute respiratory failure with hypoxemia Left lower lobe pneumonia Chronic hypoxemia on 2 L nasal cannula at home Chronic anemia Paroxysmal atrial fibrillation Hypertension Peripheral vascular disease History of pulmonary emboli Large hiatal hernia Possible aspiration pneumonia Possible urinary tract infection Pleural effusion . Plan Agree with telemetry follow-up EKGs troponins Continue diuresis intravenously I agree with BiPAP for respiratory failure management Continue supplemental oxygen as necessary Consider thoracentesis for pleural effusion to help with shortness of breath Agree  with broad-spectrum antibiotic therapy for possible early pneumonia Large hiatal hernia may have contributed to possible aspiration pneumonia recommend PPIs and elevating head of bed Anticoagulation rate control for atrial fibrillation should be continued up until invasive procedure necessary I am also concerned that we need to entertain the possibility of cardiac amyloid patient may benefit from PET scanning or MRI.  Both can be done as an outpatient Continue to follow the patient conservatively from a cardiac standpoint  Signed: Yolonda Kida MD 05/14/2022, 11:22 AM

## 2022-05-14 NOTE — Progress Notes (Signed)
Pharmacy Antibiotic Note  [name] is a 89 y.o. female with PMH of CHF, Afib, PE, HTN, GI bleed, and cancer admitted on 05/11/2022 with pneumonia.  Pharmacy has been consulted for Ampicillin-Sulbactam  dosing.  Plan: start Ampicillin-Sulbactam 3 grams IV every 6 hours ---follow renal function for needed dose adjustments  Weight: 60.8 kg (134 lb 0.6 oz)  Temp (24hrs), Avg:97.6 F (36.4 C), Min:97.4 F (36.3 C), Max:98.1 F (36.7 C)  Recent Labs  Lab 05/12/22 0009 05/13/22 0653 05/14/22 0335  WBC 11.4* 15.2* 9.7  CREATININE 0.79 0.70 0.72  LATICACIDVEN 0.9  --   --     Estimated Creatinine Clearance: 39.3 mL/min (by C-G formula based on SCr of 0.72 mg/dL).    Allergies  Allergen Reactions   Fentanyl Other (See Comments)    confusion    Antimicrobials this admission: 03/30 Unasyn >>  03/30 azithromycin >>  Microbiology results: 03/30 UCx: pending   Thank you for allowing pharmacy to be a part of this patient's care.  Dallie Piles 05/14/2022 9:13 AM

## 2022-05-14 NOTE — Plan of Care (Signed)
  Problem: Education: Goal: Ability to demonstrate management of disease process will improve Outcome: Progressing Goal: Ability to verbalize understanding of medication therapies will improve Outcome: Progressing Goal: Individualized Educational Video(s) Outcome: Progressing   Problem: Activity: Goal: Capacity to carry out activities will improve Outcome: Progressing   Problem: Cardiac: Goal: Ability to achieve and maintain adequate cardiopulmonary perfusion will improve Outcome: Progressing   Problem: Education: Goal: Knowledge of General Education information will improve Description: Including pain rating scale, medication(s)/side effects and non-pharmacologic comfort measures Outcome: Progressing   Problem: Health Behavior/Discharge Planning: Goal: Ability to manage health-related needs will improve Outcome: Progressing   

## 2022-05-15 DIAGNOSIS — I5033 Acute on chronic diastolic (congestive) heart failure: Secondary | ICD-10-CM | POA: Diagnosis not present

## 2022-05-15 LAB — POTASSIUM: Potassium: 4.6 mmol/L (ref 3.5–5.1)

## 2022-05-15 LAB — CBC
HCT: 30.2 % — ABNORMAL LOW (ref 36.0–46.0)
Hemoglobin: 9.5 g/dL — ABNORMAL LOW (ref 12.0–15.0)
MCH: 26.8 pg (ref 26.0–34.0)
MCHC: 31.5 g/dL (ref 30.0–36.0)
MCV: 85.3 fL (ref 80.0–100.0)
Platelets: 481 10*3/uL — ABNORMAL HIGH (ref 150–400)
RBC: 3.54 MIL/uL — ABNORMAL LOW (ref 3.87–5.11)
RDW: 15.4 % (ref 11.5–15.5)
WBC: 7.7 10*3/uL (ref 4.0–10.5)
nRBC: 0 % (ref 0.0–0.2)

## 2022-05-15 LAB — BASIC METABOLIC PANEL
Anion gap: 10 (ref 5–15)
BUN: 26 mg/dL — ABNORMAL HIGH (ref 8–23)
CO2: 37 mmol/L — ABNORMAL HIGH (ref 22–32)
Calcium: 8.7 mg/dL — ABNORMAL LOW (ref 8.9–10.3)
Chloride: 94 mmol/L — ABNORMAL LOW (ref 98–111)
Creatinine, Ser: 0.77 mg/dL (ref 0.44–1.00)
GFR, Estimated: >60 mL/min (ref >60)
Glucose, Bld: 114 mg/dL — ABNORMAL HIGH (ref 70–99)
Potassium: 2.8 mmol/L — ABNORMAL LOW (ref 3.5–5.1)
Sodium: 139 mmol/L (ref 135–145)

## 2022-05-15 LAB — MAGNESIUM: Magnesium: 2.1 mg/dL (ref 1.7–2.4)

## 2022-05-15 MED ORDER — IPRATROPIUM-ALBUTEROL 0.5-2.5 (3) MG/3ML IN SOLN
3.0000 mL | Freq: Two times a day (BID) | RESPIRATORY_TRACT | Status: DC
  Administered 2022-05-15 – 2022-05-17 (×4): 3 mL via RESPIRATORY_TRACT
  Filled 2022-05-15 (×4): qty 3

## 2022-05-15 MED ORDER — PIPERACILLIN-TAZOBACTAM 3.375 G IVPB
3.3750 g | Freq: Three times a day (TID) | INTRAVENOUS | Status: DC
  Administered 2022-05-15 – 2022-05-19 (×12): 3.375 g via INTRAVENOUS
  Filled 2022-05-15 (×12): qty 50

## 2022-05-15 MED ORDER — POTASSIUM CHLORIDE CRYS ER 20 MEQ PO TBCR
20.0000 meq | EXTENDED_RELEASE_TABLET | Freq: Two times a day (BID) | ORAL | Status: DC
  Administered 2022-05-16 (×2): 20 meq via ORAL
  Filled 2022-05-15 (×2): qty 1

## 2022-05-15 MED ORDER — POTASSIUM CHLORIDE 10 MEQ/100ML IV SOLN
10.0000 meq | INTRAVENOUS | Status: AC
  Administered 2022-05-15 (×4): 10 meq via INTRAVENOUS
  Filled 2022-05-15 (×5): qty 100

## 2022-05-15 MED ORDER — POTASSIUM CHLORIDE CRYS ER 20 MEQ PO TBCR
40.0000 meq | EXTENDED_RELEASE_TABLET | Freq: Once | ORAL | Status: AC
  Administered 2022-05-15: 40 meq via ORAL
  Filled 2022-05-15: qty 2

## 2022-05-15 MED ORDER — FUROSEMIDE 10 MG/ML IJ SOLN
40.0000 mg | Freq: Two times a day (BID) | INTRAMUSCULAR | Status: DC
  Administered 2022-05-15 – 2022-05-16 (×2): 40 mg via INTRAVENOUS
  Filled 2022-05-15 (×2): qty 4

## 2022-05-15 NOTE — Progress Notes (Addendum)
PROGRESS NOTE    [name]  [id number] DOB: [birth year] DOA: 05/11/2022 PCP: [name], MD   Brief Narrative:  89 year old female with history of CHF with preserved EF, A-fib, PE on Eliquis, HTN, GI bleed, hereditary hemochromatosis, colon cancer status postsurgical resection, GERD, ambulatory dysfunction comes to the ED for acute respiratory distress requiring 6 L nasal cannula.  Upon admission on room air she was hypoxic saturating as low as 70%.  Initially placed on BiPAP.  COVID swab was negative.  She did have COVID about 6 months ago.  In the ED patient was treated with IV diuretics. Due to worsening resp status, her lasix was increased. Added Abx for possible aspiration.  She underwent thoracentesis, 400 cc removed.   Assessment & Plan:  Principal Problem:   Acute on chronic heart failure with preserved ejection fraction (HCC) Active Problems:   Acute respiratory failure with hypoxia (HCC)   Paroxysmal atrial fibrillation (HCC)   Hyponatremia   Essential hypertension   Anemia, iron deficiency   Hereditary hemochromatosis (HCC)   PAD (peripheral artery disease) (HCC)   History of pulmonary embolism October 2023     Assessment and Plan: * Acute on chronic heart failure with preserved ejection fraction (HCC) Acute on chronic respiratory failure with hypoxia; 2L [place] at home Symptomatic anemia Procalcitonin negative, getting aggressive diuresis.  Cardiology following. - Echocardiogram in October 2023 showed EF of 50-55%. -[place].   UTI, GNR Aspiration/parapneumonic effusion.  Large hiatal hernia On Unasyn & Azithromycin.  Bronchodilators. IS/Flutter.  Seen by S&S. Large Hiatal Hernia- PPI BID  Solumedrol S/P Thoracentesis 400cc removed. Body fluid neg for infection.   Hypokalemia Repletion per pharmacy  Paroxysmal atrial fibrillation (HCC) Continue Eliquis and metoprolol Replete electrolytes as appropriate  Hyponatremia Resolved.   Essential  hypertension Continue metoprolol.  IV as needed  Anemia, iron deficiency History of blood loss anemia related to bruising from anticoagulation Baseline hemoglobin is around 7.5.  Hb 7.3 s/p 1U PRBC. Hb 9.4  History of pulmonary embolism October 2023 This was presumed to be in the setting of COVID-19 pneumonia.  Continue Eliquis for now  PAD (peripheral artery disease) ([place]) Continue simvastatin and apixaban  Patient is mostly bedbound for the past 4-5 months at home. Overall very sick given her age and coomorbidites  Palliative care consulted due to recurrent aspirations.   DVT prophylaxis: Eliquis Code Status: DNR Family Communication: Spoke with [name]  Status is: Inpatient Still volume overloaded requiring IV diuretics Eventually plans to go home.  Hopefully next 48 hours  Subjective: Doing better today, breathing easier.   I had a long chat with [name], who understsand this will be a recurring issue but for now will treat the treatable. She is willing to discuss with Palliative care service as this would benefit her mother the most.   Examination: Constitutional: Not in acute distress. frail Respiratory: bibasilar crackles.  Cardiovascular: Normal sinus rhythm, no rubs Abdomen: Nontender nondistended good bowel sounds Musculoskeletal: No edema noted Skin: No rashes seen Neurologic: CN 2-12 grossly intact.  And nonfocal Psychiatric: Normal judgment and insight. Alert and oriented x 3. Normal mood.    Objective: Vitals:   05/14/22 2257 05/14/22 2300 05/15/22 0500 05/15/22 0755  BP:  (!) 152/79  (!) 130/49  Pulse:  83  72  Resp:  17    Temp: 97.9 F (36.6 C)   97.9 F (36.6 C)  TempSrc:    Oral  SpO2:  99%  100%  Weight:   62  kg     Intake/Output Summary (Last 24 hours) at 05/15/2022 0854 Last data filed at 05/15/2022 0330 Gross per 24 hour  Intake 1070.03 ml  Output 1650 ml  Net -579.97 ml   Filed Weights   05/13/22 0829 05/14/22 0704 05/15/22 0500  Weight:  60.8 kg 62.8 kg 62 kg     Data Reviewed:   CBC: Recent Labs  Lab 05/12/22 0009 05/12/22 0540 05/12/22 1744 05/13/22 0653 05/14/22 0335 05/15/22 0537  WBC 11.4*  --   --  15.2* 9.7 7.7  NEUTROABS 8.8*  --   --   --   --   --   HGB 7.3* 7.2* 8.5* 9.4* 9.8* 9.5*  HCT 24.3*  --   --  28.6* 31.4* 30.2*  MCV 85.3  --   --  82.2 85.6 85.3  PLT 477*  --   --  460* 472* 123XX123*   Basic Metabolic Panel: Recent Labs  Lab 05/12/22 0009 05/13/22 0653 05/13/22 0929 05/14/22 0335 05/15/22 0537  NA 131* 136  --  134* 139  K 3.7 5.5* 3.3* 3.3* 2.8*  CL 94* 98  --  89* 94*  CO2 26 28  --  33* 37*  GLUCOSE 127* 110*  --  115* 114*  BUN 20 28*  --  24* 26*  CREATININE 0.79 0.70  --  0.72 0.77  CALCIUM 8.9 8.9  --  8.7* 8.7*  MG  --  2.4  --  1.9 2.1   GFR: Estimated Creatinine Clearance: 39.7 mL/min (by C-G formula based on SCr of 0.77 mg/dL). Liver Function Tests: Recent Labs  Lab 05/12/22 0009  AST 15  ALT 9  ALKPHOS 54  BILITOT 0.8  PROT 6.8  ALBUMIN 3.2*   No results for input(s): "LIPASE", "AMYLASE" in the last 168 hours. No results for input(s): "AMMONIA" in the last 168 hours. Coagulation Profile: Recent Labs  Lab 05/12/22 0041  INR 2.1*   Cardiac Enzymes: No results for input(s): "CKTOTAL", "CKMB", "CKMBINDEX", "TROPONINI" in the last 168 hours. BNP (last 3 results) No results for input(s): "PROBNP" in the last 8760 hours. HbA1C: No results for input(s): "HGBA1C" in the last 72 hours. CBG: No results for input(s): "GLUCAP" in the last 168 hours. Lipid Profile: No results for input(s): "CHOL", "HDL", "LDLCALC", "TRIG", "CHOLHDL", "LDLDIRECT" in the last 72 hours. Thyroid Function Tests: No results for input(s): "TSH", "T4TOTAL", "FREET4", "T3FREE", "THYROIDAB" in the last 72 hours. Anemia Panel: No results for input(s): "VITAMINB12", "FOLATE", "FERRITIN", "TIBC", "IRON", "RETICCTPCT" in the last 72 hours. Sepsis Labs: Recent Labs  Lab 05/12/22 0009  05/13/22 0929  PROCALCITON  --  <0.10  LATICACIDVEN 0.9  --     Recent Results (from the past 240 hour(s))  SARS Coronavirus 2 by RT PCR (hospital order, performed in [facility] hospital lab) *cepheid single result test* Anterior Nasal Swab     Status: None   Collection Time: 05/12/22 12:09 AM   Specimen: Anterior Nasal Swab  Result Value Ref Range Status   SARS Coronavirus 2 by RT PCR NEGATIVE NEGATIVE Final    Comment: (NOTE) SARS-CoV-2 target nucleic acids are NOT DETECTED.  The SARS-CoV-2 RNA is generally detectable in upper and lower respiratory specimens during the acute phase of infection. The lowest concentration of SARS-CoV-2 viral copies this assay can detect is 250 copies / mL. A negative result does not preclude SARS-CoV-2 infection and should not be used as the sole basis for treatment or other patient management decisions.  A negative result may occur with improper specimen collection / handling, submission of specimen other than nasopharyngeal swab, presence of viral mutation(s) within the areas targeted by this assay, and inadequate number of viral copies (<250 copies / mL). A negative result must be combined with clinical observations, patient history, and epidemiological information.  Fact Sheet for Patients:   https://www.patel.info/  Fact Sheet for Healthcare Providers: https://hall.com/  This test is not yet approved or  cleared by the [place] FDA and has been authorized for detection and/or diagnosis of SARS-CoV-2 by FDA under an Emergency Use Authorization (EUA).  This EUA will remain in effect (meaning this test can be used) for the duration of the COVID-19 declaration under Section 564(b)(1) of the Act, 21 U.S.C. section 360bbb-3(b)(1), unless the authorization is terminated or revoked sooner.  Performed at [facility], [address] [place], [place] [address]   Body fluid culture w Gram  Stain     Status: None (Preliminary result)   Collection Time: 05/14/22  1:15 PM   Specimen: PATH Cytology Pleural fluid  Result Value Ref Range Status   Specimen Description   Final    PLEURAL Performed at [facility], [address], [place], [place] [address]    Special Requests   Final    CYTO PLEU Performed at [facility], [address] [place], [place] [address]    Gram Stain   Final    NO ORGANISMS SEEN NO WBC SEEN Performed at [facility], [place] [address], [place], [place] [address]    Culture PENDING  Incomplete   Report Status PENDING  Incomplete         Radiology Studies: US THORACENTESIS ASP PLEURAL SPACE W/IMG GUIDE  Result Date: 05/14/2022 INDICATION: Patient with pneumonia and left pleural effusion. Request for diagnostic and therapeutic thoracentesis. EXAM: ULTRASOUND GUIDED LEFT THORACENTESIS MEDICATIONS: 1% lidocaine 10 mL COMPLICATIONS: None immediate. PROCEDURE: An ultrasound guided thoracentesis was thoroughly discussed with the patient and questions answered. The benefits, risks, alternatives and complications were also discussed. The patient understands and wishes to proceed with the procedure. Written consent was obtained. Ultrasound was performed to localize and mark an adequate pocket of fluid in the left chest. The area was then prepped and draped in the normal sterile fashion. 1% Lidocaine was used for local anesthesia. Under ultrasound guidance a 6 Fr Safe-T-Centesis catheter was introduced. Thoracentesis was performed. The catheter was removed and a dressing applied. FINDINGS: A total of approximately 400 mL of clear yellow fluid was removed. Samples were sent to the laboratory as requested by the clinical team. IMPRESSION: Successful ultrasound guided left thoracentesis yielding 400 mL of pleural fluid. No pneumothorax on post-procedure chest x-ray. Procedure performed by: Gareth Eagle, PA-C Electronically Signed   By: Aletta Edouard M.D.   On: 05/14/2022 14:38   DG Chest 1 View  Result Date: 05/14/2022 CLINICAL DATA:  Status post left thoracentesis EXAM: CHEST  1 VIEW COMPARISON:  Ultrasound thoracentesis dated 05/14/2022, chest radiograph dated 05/14/2022 at 7:16 a.m. FINDINGS: Normal lung volumes. Left basilar patchy opacities. Retrocardiac lucency. Minimal blunting of the left costophrenic angle. No definite pneumothorax. Similar cardiomediastinal silhouette. The visualized skeletal structures are unremarkable. IMPRESSION: 1. No definite pneumothorax status post left thoracentesis. Minimal blunting of the left costophrenic angle may reflect trace residual pleural effusion. 2. Left basilar patchy opacities, likely atelectasis. Aspiration or pneumonia can be considered in the appropriate clinical setting. 3. Retrocardiac lucency, which may represent a hiatal hernia.  Electronically Signed   By: Darrin Nipper M.D.   On: 05/14/2022 13:53   DG Chest Port 1 View  Result Date: 05/14/2022 CLINICAL DATA:  89 year old female with history of hypoxia. EXAM: PORTABLE CHEST 1 VIEW COMPARISON:  Chest x-ray 05/12/2022. FINDINGS: Worsening aeration throughout the left mid to lower lung, concerning for progressive airspace consolidation. Moderate left pleural effusion. Right lung is clear. No right pleural effusion. No pneumothorax. Chronic bilateral apical pleural-parenchymal thickening (right-greater-than-left), similar to prior studies, presumably chronic post infectious or inflammatory scarring. No evidence of pulmonary edema. Heart size is mildly enlarged. Upper mediastinal contours are within normal limits. IMPRESSION: 1. Worsening aeration in the left lower lobe concerning for left lower lobe pneumonia with increasing moderate left parapneumonic pleural effusion. 2. Mild cardiomegaly. 3. Aortic atherosclerosis. Electronically Signed   By: Vinnie Langton M.D.   On: 05/14/2022 07:27        Scheduled Meds:  apixaban  5 mg Oral BID    docusate sodium  100 mg Oral BID   furosemide  40 mg Intravenous Q8H   gabapentin  200 mg Oral QHS   ipratropium-albuterol  3 mL Nebulization TID   methylPREDNISolone (SOLU-MEDROL) injection  40 mg Intravenous Daily   metoprolol succinate  25 mg Oral Daily   pantoprazole  40 mg Oral BID   [START ON 05/16/2022] potassium chloride  20 mEq Oral BID   potassium chloride  40 mEq Oral Once   simvastatin  10 mg Oral QHS   Continuous Infusions:  ampicillin-sulbactam (UNASYN) IV 3 g (05/15/22 0544)   azithromycin 500 mg (05/14/22 1048)   potassium chloride       LOS: 3 days   Time spent= 35 mins    Shanetta Nicolls Arsenio Loader, MD [facility]  If 7PM-7AM, please contact night-coverage  05/15/2022, 8:54 AM

## 2022-05-15 NOTE — Progress Notes (Signed)
[facility] Cardiology    SUBJECTIVE: Patient states he feels well less shortness of breath still has some generalized weakness and fatigue   Vitals:   05/15/22 0500 05/15/22 0755 05/15/22 1157 05/15/22 1217  BP:  (!) 130/49 (!) 141/79   Pulse:  72 71 89  Resp:  18 18 18   Temp:  97.9 F (36.6 C) 97.8 F (36.6 C)   TempSrc:  Oral Oral   SpO2:  100% 100%   Weight: 62 kg        Intake/Output Summary (Last 24 hours) at 05/15/2022 1318 Last data filed at 05/15/2022 1103 Gross per 24 hour  Intake 1023.54 ml  Output 1750 ml  Net -726.46 ml      PHYSICAL EXAM  General: Well developed, well nourished, in no acute distress HEENT:  Normocephalic and atramatic Neck:  No JVD.  Lungs: Clear bilaterally to auscultation and percussion. Heart: HRRR . Normal S1 and S2 without gallops or murmurs.  Abdomen: Bowel sounds are positive, abdomen soft and non-tender  Msk:  Back normal, normal gait. Normal strength and tone for age. Extremities: No clubbing, cyanosis or edema.   Neuro: Alert and oriented X 3. Psych:  Good affect, responds appropriately   LABS: Basic Metabolic Panel: Recent Labs    05/14/22 0335 05/15/22 0537  NA 134* 139  K 3.3* 2.8*  CL 89* 94*  CO2 33* 37*  GLUCOSE 115* 114*  BUN 24* 26*  CREATININE 0.72 0.77  CALCIUM 8.7* 8.7*  MG 1.9 2.1   Liver Function Tests: No results for input(s): "AST", "ALT", "ALKPHOS", "BILITOT", "PROT", "ALBUMIN" in the last 72 hours. No results for input(s): "LIPASE", "AMYLASE" in the last 72 hours. CBC: Recent Labs    05/14/22 0335 05/15/22 0537  WBC 9.7 7.7  HGB 9.8* 9.5*  HCT 31.4* 30.2*  MCV 85.6 85.3  PLT 472* 481*   Cardiac Enzymes: No results for input(s): "CKTOTAL", "CKMB", "CKMBINDEX", "TROPONINI" in the last 72 hours. BNP: Invalid input(s): "POCBNP" D-Dimer: No results for input(s): "DDIMER" in the last 72 hours. Hemoglobin A1C: No results for input(s): "HGBA1C" in the last 72 hours. Fasting Lipid Panel: No  results for input(s): "CHOL", "HDL", "LDLCALC", "TRIG", "CHOLHDL", "LDLDIRECT" in the last 72 hours. Thyroid Function Tests: No results for input(s): "TSH", "T4TOTAL", "T3FREE", "THYROIDAB" in the last 72 hours.  Invalid input(s): "FREET3" Anemia Panel: No results for input(s): "VITAMINB12", "FOLATE", "FERRITIN", "TIBC", "IRON", "RETICCTPCT" in the last 72 hours.  US THORACENTESIS ASP PLEURAL SPACE W/IMG GUIDE  Result Date: 05/14/2022 INDICATION: Patient with pneumonia and left pleural effusion. Request for diagnostic and therapeutic thoracentesis. EXAM: ULTRASOUND GUIDED LEFT THORACENTESIS MEDICATIONS: 1% lidocaine 10 mL COMPLICATIONS: None immediate. PROCEDURE: An ultrasound guided thoracentesis was thoroughly discussed with the patient and questions answered. The benefits, risks, alternatives and complications were also discussed. The patient understands and wishes to proceed with the procedure. Written consent was obtained. Ultrasound was performed to localize and mark an adequate pocket of fluid in the left chest. The area was then prepped and draped in the normal sterile fashion. 1% Lidocaine was used for local anesthesia. Under ultrasound guidance a 6 Fr Safe-T-Centesis catheter was introduced. Thoracentesis was performed. The catheter was removed and a dressing applied. FINDINGS: A total of approximately 400 mL of clear yellow fluid was removed. Samples were sent to the laboratory as requested by the clinical team. IMPRESSION: Successful ultrasound guided left thoracentesis yielding 400 mL of pleural fluid. No pneumothorax on post-procedure chest x-ray. Procedure performed by: Gareth Eagle,  PA-C Electronically Signed   By: Aletta Edouard M.D.   On: 05/14/2022 14:38   DG Chest 1 View  Result Date: 05/14/2022 CLINICAL DATA:  Status post left thoracentesis EXAM: CHEST  1 VIEW COMPARISON:  Ultrasound thoracentesis dated 05/14/2022, chest radiograph dated 05/14/2022 at 7:16 a.m. FINDINGS: Normal  lung volumes. Left basilar patchy opacities. Retrocardiac lucency. Minimal blunting of the left costophrenic angle. No definite pneumothorax. Similar cardiomediastinal silhouette. The visualized skeletal structures are unremarkable. IMPRESSION: 1. No definite pneumothorax status post left thoracentesis. Minimal blunting of the left costophrenic angle may reflect trace residual pleural effusion. 2. Left basilar patchy opacities, likely atelectasis. Aspiration or pneumonia can be considered in the appropriate clinical setting. 3. Retrocardiac lucency, which may represent a hiatal hernia. Electronically Signed   By: Darrin Nipper M.D.   On: 05/14/2022 13:53   DG Chest Port 1 View  Result Date: 05/14/2022 CLINICAL DATA:  89 year old female with history of hypoxia. EXAM: PORTABLE CHEST 1 VIEW COMPARISON:  Chest x-ray 05/12/2022. FINDINGS: Worsening aeration throughout the left mid to lower lung, concerning for progressive airspace consolidation. Moderate left pleural effusion. Right lung is clear. No right pleural effusion. No pneumothorax. Chronic bilateral apical pleural-parenchymal thickening (right-greater-than-left), similar to prior studies, presumably chronic post infectious or inflammatory scarring. No evidence of pulmonary edema. Heart size is mildly enlarged. Upper mediastinal contours are within normal limits. IMPRESSION: 1. Worsening aeration in the left lower lobe concerning for left lower lobe pneumonia with increasing moderate left parapneumonic pleural effusion. 2. Mild cardiomegaly. 3. Aortic atherosclerosis. Electronically Signed   By: Vinnie Langton M.D.   On: 05/14/2022 07:27     Echo preserved left ventricular function  TELEMETRY: Atrial fibrillation rate of 115 nonspecific ST-T wave changes:  ASSESSMENT AND PLAN:  Principal Problem:   Acute on chronic heart failure with preserved ejection fraction (HCC) Active Problems:   Essential hypertension   Anemia, iron deficiency   Hereditary  hemochromatosis (HCC)   PAD (peripheral artery disease) (HCC)   Paroxysmal atrial fibrillation (HCC)   Acute respiratory failure with hypoxia (HCC)   Hyponatremia   History of pulmonary embolism October 2023 Urinary tract infection aspiration pneumonia  Plan Acute on chronic diastolic congestive heart failure continue current therapy diuretics Agree with broad-spectrum antibiotic therapy for urinary tract infection aspiration pneumonia Agree with status postthoracentesis of 400 cc of fluid Hypertension reasonably controlled continue current medical therapy Atrial fibrillation on Eliquis therapy for anticoagulation rate controlled with metoprolol and Continue supplemental oxygen therapy for hypoxemia Agree with BiPAP therapy for current management Continue conservative management for heart failure Generalized anemia continue conservative management Continue conservative medical therapy for now   Yolonda Kida, MD 05/15/2022 1:18 PM

## 2022-05-15 NOTE — Progress Notes (Signed)
PHARMACY CONSULT NOTE - FOLLOW UP  Pharmacy Consult for Electrolyte Monitoring and Replacement   Recent Labs: Potassium (mmol/L)  Date Value  05/15/2022 2.8 (L)   Magnesium (mg/dL)  Date Value  05/15/2022 2.1   Calcium (mg/dL)  Date Value  05/15/2022 8.7 (L)   Albumin (g/dL)  Date Value  05/12/2022 3.2 (L)   Phosphorus (mg/dL)  Date Value  03/02/2022 3.8   Sodium (mmol/L)  Date Value  05/15/2022 139     Assessment: 19 YOF with PMH of CHF, Afib, PE, HTN, GI bleed, and cancer presents to the ED with respiratory distress. Most recent K+ is 3.3, subtherapeutic. Pharmacy has been consulted to manage electrolytes.   Lasix increased from 20 mg IV BID to 40 mg IV BID on 3/29. KCl 20 mEq daily has been discontinued.  3/31- lasix changed to 40 mg IV q8h on 3/30  Goal of Therapy:  Electrolytes WNL  Plan:  K 2.8  mag 2.1  Scr 0.77 -Lasix increased to 40 mg IV q8h yesterday 3/30 afternoon -Will order KCL 10 meq IV x 4 doses and KCL 40 meq PO x 1 dose -Will recheck K at 1600 -will order KCL 20 meq po BID while on scheduled lasix, to start tomorrow 4/1 F/u electrolytes with AM labs  Arelis Neumeier A, PharmD  05/15/2022 7:50 AM

## 2022-05-16 DIAGNOSIS — I5033 Acute on chronic diastolic (congestive) heart failure: Secondary | ICD-10-CM | POA: Diagnosis not present

## 2022-05-16 LAB — CBC
HCT: 32.4 % — ABNORMAL LOW (ref 36.0–46.0)
Hemoglobin: 10.2 g/dL — ABNORMAL LOW (ref 12.0–15.0)
MCH: 26.5 pg (ref 26.0–34.0)
MCHC: 31.5 g/dL (ref 30.0–36.0)
MCV: 84.2 fL (ref 80.0–100.0)
Platelets: 524 10*3/uL — ABNORMAL HIGH (ref 150–400)
RBC: 3.85 MIL/uL — ABNORMAL LOW (ref 3.87–5.11)
RDW: 15.6 % — ABNORMAL HIGH (ref 11.5–15.5)
WBC: 9.6 10*3/uL (ref 4.0–10.5)
nRBC: 0 % (ref 0.0–0.2)

## 2022-05-16 LAB — BASIC METABOLIC PANEL
Anion gap: 12 (ref 5–15)
BUN: 23 mg/dL (ref 8–23)
CO2: 34 mmol/L — ABNORMAL HIGH (ref 22–32)
Calcium: 9.1 mg/dL (ref 8.9–10.3)
Chloride: 91 mmol/L — ABNORMAL LOW (ref 98–111)
Creatinine, Ser: 0.82 mg/dL (ref 0.44–1.00)
GFR, Estimated: >60 mL/min (ref >60)
Glucose, Bld: 101 mg/dL — ABNORMAL HIGH (ref 70–99)
Potassium: 3.3 mmol/L — ABNORMAL LOW (ref 3.5–5.1)
Sodium: 137 mmol/L (ref 135–145)

## 2022-05-16 LAB — TRIGLYCERIDES, BODY FLUIDS: Triglycerides, Fluid: 16 mg/dL

## 2022-05-16 LAB — MAGNESIUM: Magnesium: 2 mg/dL (ref 1.7–2.4)

## 2022-05-16 MED ORDER — PREDNISONE 20 MG PO TABS
40.0000 mg | ORAL_TABLET | Freq: Every day | ORAL | Status: AC
  Administered 2022-05-17 – 2022-05-18 (×2): 40 mg via ORAL
  Filled 2022-05-16 (×2): qty 2

## 2022-05-16 MED ORDER — POTASSIUM CHLORIDE CRYS ER 20 MEQ PO TBCR
40.0000 meq | EXTENDED_RELEASE_TABLET | Freq: Once | ORAL | Status: AC
  Administered 2022-05-16: 40 meq via ORAL
  Filled 2022-05-16: qty 2

## 2022-05-16 MED ORDER — FUROSEMIDE 10 MG/ML IJ SOLN
40.0000 mg | Freq: Two times a day (BID) | INTRAMUSCULAR | Status: DC
  Administered 2022-05-16 – 2022-05-17 (×3): 40 mg via INTRAVENOUS
  Filled 2022-05-16 (×3): qty 4

## 2022-05-16 NOTE — Progress Notes (Signed)
PHARMACY CONSULT NOTE - FOLLOW UP  Pharmacy Consult for Electrolyte Monitoring and Replacement   Recent Labs: Potassium (mmol/L)  Date Value  05/16/2022 3.3 (L)   Magnesium (mg/dL)  Date Value  05/16/2022 2.0   Calcium (mg/dL)  Date Value  05/16/2022 9.1   Albumin (g/dL)  Date Value  05/12/2022 3.2 (L)   Phosphorus (mg/dL)  Date Value  03/02/2022 3.8   Sodium (mmol/L)  Date Value  05/16/2022 137     Assessment: 89 YOF with PMH of CHF, Afib, PE, HTN, GI bleed, and cancer presents to the ED with respiratory distress. Most recent K+ is 3.3, subtherapeutic. Pharmacy has been consulted to manage electrolytes.   On lasix 40 mg IV BID.   Goal of Therapy:  Electrolytes WNL  Plan:  Continue Kcl 20 mEq BID while on IV lasix. Will give an additional Kcl 40 mEq x 1.  F/u electrolytes with AM labs  Oswald Hillock, PharmD  05/16/2022 9:49 AM

## 2022-05-16 NOTE — Consult Note (Signed)
   [facility] CM Inpatient Consult   05/16/2022  IYANNA BAKARE October 07, 1928 [id number]  Orientation with Natividad Brood, [facility] Liaison for review.   Location: [facility] Liaison screened remotely [facility]).   [facility]) [place] Patient: Sport and exercise psychologist)   Primary Care Provider:  [name], MD with [facility]   Patient screened for readmission hospitalization with noted extreme high risk score for unplanned readmission risk with 6 IP over 6 months.  Collaborated with TOC team on pt's discharge disposition. Informed pt is from [place] (LTF) and will return upon her discharge disposition.   [place] does not replace or interfere with any arrangements made by the Inpatient Transition of Care team.   For questions contact:    Raina Mina, RN, [place] Hours M-F 8:00 am to 5 pm [phone] mobile [phone] [Office toll free line][facility] Office Hours are M-F 8:30 - 5 pm 24 hour nurse advise line [phone] Conceirge  [email or web address]

## 2022-05-16 NOTE — Care Management Important Message (Signed)
Important Message  Patient Details  Name: [name] MRN: [id number] Date of Birth: 09-22-1928   Medicare Important Message Given:  Yes     [name] 05/16/2022, 12:50 PM

## 2022-05-16 NOTE — Progress Notes (Signed)
[place] NOTE       Patient ID: [name] MRN: [id number] DOB/AGE: October 14, 1928 89 y.o.  Admit date: 05/11/2022 Referring Physician Dr. Gerlean Ren Primary Physician Dr. [name] Primary Cardiologist Dr. Saralyn Pilar Reason for Consultation AoCHF  HPI: [name] is a 8yoF with a PMH of paroxysmal AF/pulmonary embolus on eliquis, HFpEF, HTN, HLD, hx colon cancer s/p resection, GERD, GI bleeding who presented to [facility] ED 05/11/2022 in respiratory distress requiring 6L by [place] and BIPAP. She has been diuresing with IV lasix, also had L thoracentesis with 49mL fluid aspirated. UA with pseudomonas and E. Faecalis, also concern for aspiration PNA.   Interval history:  - seen and examined with husband at bedside. The patient says she feels good, denies shortness of breath or chest pain. Main concern is persistent cough. Husband helps corroborate history  - diuresing well, still on supplemental O2  - in AF on tele, rate controlled  Review of systems complete and found to be negative unless listed above     Past Medical History:  Diagnosis Date   Anemia    Barrett's esophagus    BCC (basal cell carcinoma of skin) 03/07/2013   vertex scalp - NODULAR NODULAR PATTERN PATTERN   Bowel obstruction (HCC)    s/p adhesion resection   CHF (congestive heart failure) (HCC)    Chronic back pain    Colon cancer ([place]) 1988 and 1989   adenocarcinoma, s/p resection x  2 and chemo   Diverticulitis    GERD (gastroesophageal reflux disease)    H/O open leg wound    Hiatal hernia    Hypercholesteremia    Hypertension    Lumbar scoliosis    OA (osteoarthritis)    Peripheral neuropathy    Recurrent sinus infections    Renal cyst    S/P chemotherapy, time since greater than 12 weeks    colon cancer   Vertigo     Past Surgical History:  Procedure Laterality Date   ABDOMINAL HYSTERECTOMY  1982   adhesions resected     bowel obstruction   APPENDECTOMY     BREAST  BIOPSY Left    negative 06/14/1985   CHOLECYSTECTOMY  1994   COLON RESECTION     x2.  s/p colon cancer   COLON SURGERY  1958 and 1989   For Colon Cancer   EXCISIONAL HEMORRHOIDECTOMY     with tubal ligation   EXPLORATORY LAPAROTOMY  1991   Secondary to SBO    Medications Prior to Admission  Medication Sig Dispense Refill Last Dose   acetaminophen (TYLENOL) 500 MG tablet Take 500 mg by mouth every 6 (six) hours as needed.   unk at unk   apixaban (ELIQUIS) 5 MG TABS tablet Take 5 mg by mouth 2 (two) times daily.   05/11/2022 at 1800   calcium carbonate (OSCAL) 1500 (600 Ca) MG TABS tablet Take 600 mg of elemental calcium by mouth 2 (two) times daily with a meal.   05/11/2022 at 1730   Cholecalciferol (VITAMIN D3) 50 MCG (2000 UT) capsule Take 2,000 Units by mouth daily.   05/11/2022   docusate sodium (COLACE) 100 MG capsule Take 100 mg by mouth 2 (two) times daily.   05/11/2022 at 1800   ferrous sulfate 325 (65 FE) MG EC tablet Take 325 mg by mouth daily with breakfast.   05/11/2022 at 0800   fexofenadine (ALLEGRA) 60 MG tablet TAKE 1 TABLET EVERY DAY (Patient taking differently: Take 60 mg  by mouth daily.) 90 tablet 3 05/11/2022 at 0900   furosemide (LASIX) 20 MG tablet Take 1 tablet (20 mg total) by mouth daily. 30 tablet 0 05/11/2022 at 0900   gabapentin (NEURONTIN) 100 MG capsule TAKE 2 CAPSULES AT BEDTIME (Patient taking differently: Take 200 mg by mouth at bedtime. TAKE 2 CAPSULES AT BEDTIME) 180 capsule 1 05/11/2022 at 2000   guaiFENesin-dextromethorphan (ROBITUSSIN DM) 100-10 MG/5ML syrup Take 10 mLs by mouth every 4 (four) hours as needed for cough.   05/11/2022 at unk   ipratropium (ATROVENT) 0.06 % nasal spray Place 2 sprays into both nostrils 4 (four) times daily.   05/11/2022   ipratropium-albuterol (DUONEB) 0.5-2.5 (3) MG/3ML SOLN Take 3 mLs by nebulization every 4 (four) hours as needed.   05/11/2022 at unk   metoprolol succinate (TOPROL-XL) 25 MG 24 hr tablet TAKE 1 TABLET TWICE DAILY  (Patient taking differently: Take 25 mg by mouth in the morning and at bedtime.) 180 tablet 1 05/11/2022 at 1800   pantoprazole (PROTONIX) 40 MG tablet Take 40 mg by mouth 2 (two) times daily.   05/11/2022 at 1700   potassium chloride SA (KLOR-CON M) 20 MEQ tablet Take 1 tablet (20 mEq total) by mouth daily.   05/11/2022 at 0800   predniSONE (DELTASONE) 5 MG tablet Take 5 mg by mouth daily with breakfast.   05/11/2022 at 0900   simvastatin (ZOCOR) 10 MG tablet TAKE 1 TABLET AT BEDTIME 90 tablet 3 05/11/2022 at 2000   albuterol (VENTOLIN HFA) 108 (90 Base) MCG/ACT inhaler TAKE 2 PUFFS BY MOUTH EVERY 6 HOURS AS NEEDED FOR WHEEZE OR SHORTNESS OF BREATH (Patient not taking: Reported on 05/12/2022) 18 g 1 Not Taking   Social History   Socioeconomic History   Marital status: Married    Spouse name: Not on file   Number of children: 4   Years of education: 12th grade   Highest education level: Not on file  Occupational History   Occupation: homemaker  Tobacco Use   Smoking status: Never   Smokeless tobacco: Never  Vaping Use   Vaping Use: Never used  Substance and Sexual Activity   Alcohol use: No    Alcohol/week: 0.0 standard drinks of alcohol   Drug use: No   Sexual activity: Not Currently  Other Topics Concern   Not on file  Social History Narrative   Not on file   Social Determinants of Health   Financial Resource Strain: Low Risk  (11/30/2020)   Overall Financial Resource Strain (CARDIA)    Difficulty of Paying Living Expenses: Not hard at all  Food Insecurity: No Food Insecurity (05/12/2022)   Hunger Vital Sign    Worried About Running Out of Food in the Last Year: Never true    Ran Out of Food in the Last Year: Never true  Transportation Needs: No Transportation Needs (05/12/2022)   PRAPARE - Hydrologist (Medical): No    Lack of Transportation (Non-Medical): No  Physical Activity: Unknown (11/30/2020)   Exercise Vital Sign    Days of Exercise per  Week: 0 days    Minutes of Exercise per Session: Not on file  Stress: No Stress Concern Present (11/30/2020)   [place]    Feeling of Stress : Not at all  Social Connections: Unknown (11/30/2020)   Social Connection and Isolation Panel [NHANES]    Frequency of Communication with Friends and Family: More than three times a  week    Frequency of Social Gatherings with Friends and Family: More than three times a week    Attends Religious Services: Not on file    Active Member of Clubs or Organizations: Not on file    Attends Archivist Meetings: Not on file    Marital Status: Married  Intimate Partner Violence: Not At Risk (05/12/2022)   Humiliation, Afraid, Rape, and Kick questionnaire    Fear of Current or Ex-Partner: No    Emotionally Abused: No    Physically Abused: No    Sexually Abused: No    Family History  Problem Relation Age of Onset   Breast cancer Mother    Asthma Mother    Stroke Father    Hypertension Father    Diabetes Father    Ovarian cancer Sister        x2   Prostate cancer Brother    Hypertension Brother        x3   Heart disease Brother    Hypercholesterolemia Brother        x3   Diabetes Brother    Spina bifida Grandchild    Hematuria Neg Hx    Kidney cancer Neg Hx    Kidney disease Neg Hx    Sickle cell trait Neg Hx    Tuberculosis Neg Hx       Intake/Output Summary (Last 24 hours) at 05/16/2022 0940 Last data filed at 05/16/2022 0328 Gross per 24 hour  Intake 1353.29 ml  Output 1750 ml  Net -396.71 ml    Vitals:   05/15/22 2014 05/16/22 0043 05/16/22 0330 05/16/22 0813  BP:    139/68  Pulse:    67  Resp:    18  Temp: 98.8 F (37.1 C) 97.7 F (36.5 C) (!) 97.2 F (36.2 C) 98.3 F (36.8 C)  TempSrc:    Oral  SpO2: 100%   98%  Weight:        PHYSICAL EXAM General: elderly and frail appearing caucasian female, in no acute distress. Sitting upright in bed with  husband present.  HEENT:  Normocephalic and atraumatic. Neck:  No JVD.  Lungs: Normal respiratory effort on O2 by [place]. Decreased breath sounds with bibasilar crackles.  Heart: irregularly irregular with controlled rate . Normal S1 and S2 without gallops or murmurs.  Abdomen: Non-distended appearing.  Msk: Normal strength and tone for age. Extremities: Warm and well perfused. No clubbing, cyanosis. No peripheral  edema.  Neuro: Alert and oriented X 3. Psych:  Answers questions appropriately.   Labs: Basic Metabolic Panel: Recent Labs    05/15/22 0537 05/15/22 1629 05/16/22 0626  NA 139  --  137  K 2.8* 4.6 3.3*  CL 94*  --  91*  CO2 37*  --  34*  GLUCOSE 114*  --  101*  BUN 26*  --  23  CREATININE 0.77  --  0.82  CALCIUM 8.7*  --  9.1  MG 2.1  --  2.0   Liver Function Tests: No results for input(s): "AST", "ALT", "ALKPHOS", "BILITOT", "PROT", "ALBUMIN" in the last 72 hours. No results for input(s): "LIPASE", "AMYLASE" in the last 72 hours. CBC: Recent Labs    05/15/22 0537 05/16/22 0626  WBC 7.7 9.6  HGB 9.5* 10.2*  HCT 30.2* 32.4*  MCV 85.3 84.2  PLT 481* 524*   Cardiac Enzymes: No results for input(s): "CKTOTAL", "CKMB", "CKMBINDEX", "TROPONINIHS" in the last 72 hours. BNP: No results for input(s): "BNP" in the last  72 hours. D-Dimer: No results for input(s): "DDIMER" in the last 72 hours. Hemoglobin A1C: No results for input(s): "HGBA1C" in the last 72 hours. Fasting Lipid Panel: No results for input(s): "CHOL", "HDL", "LDLCALC", "TRIG", "CHOLHDL", "LDLDIRECT" in the last 72 hours. Thyroid Function Tests: No results for input(s): "TSH", "T4TOTAL", "T3FREE", "THYROIDAB" in the last 72 hours.  Invalid input(s): "FREET3" Anemia Panel: No results for input(s): "VITAMINB12", "FOLATE", "FERRITIN", "TIBC", "IRON", "RETICCTPCT" in the last 72 hours.   Radiology: US THORACENTESIS ASP PLEURAL SPACE W/IMG GUIDE  Result Date: 05/14/2022 INDICATION: Patient with  pneumonia and left pleural effusion. Request for diagnostic and therapeutic thoracentesis. EXAM: ULTRASOUND GUIDED LEFT THORACENTESIS MEDICATIONS: 1% lidocaine 10 mL COMPLICATIONS: None immediate. PROCEDURE: An ultrasound guided thoracentesis was thoroughly discussed with the patient and questions answered. The benefits, risks, alternatives and complications were also discussed. The patient understands and wishes to proceed with the procedure. Written consent was obtained. Ultrasound was performed to localize and mark an adequate pocket of fluid in the left chest. The area was then prepped and draped in the normal sterile fashion. 1% Lidocaine was used for local anesthesia. Under ultrasound guidance a 6 Fr Safe-T-Centesis catheter was introduced. Thoracentesis was performed. The catheter was removed and a dressing applied. FINDINGS: A total of approximately 400 mL of clear yellow fluid was removed. Samples were sent to the laboratory as requested by the clinical team. IMPRESSION: Successful ultrasound guided left thoracentesis yielding 400 mL of pleural fluid. No pneumothorax on post-procedure chest x-ray. Procedure performed by: Gareth Eagle, PA-C Electronically Signed   By: Aletta Edouard M.D.   On: 05/14/2022 14:38   DG Chest 1 View  Result Date: 05/14/2022 CLINICAL DATA:  Status post left thoracentesis EXAM: CHEST  1 VIEW COMPARISON:  Ultrasound thoracentesis dated 05/14/2022, chest radiograph dated 05/14/2022 at 7:16 a.m. FINDINGS: Normal lung volumes. Left basilar patchy opacities. Retrocardiac lucency. Minimal blunting of the left costophrenic angle. No definite pneumothorax. Similar cardiomediastinal silhouette. The visualized skeletal structures are unremarkable. IMPRESSION: 1. No definite pneumothorax status post left thoracentesis. Minimal blunting of the left costophrenic angle may reflect trace residual pleural effusion. 2. Left basilar patchy opacities, likely atelectasis. Aspiration or pneumonia  can be considered in the appropriate clinical setting. 3. Retrocardiac lucency, which may represent a hiatal hernia. Electronically Signed   By: Darrin Nipper M.D.   On: 05/14/2022 13:53   DG Chest Port 1 View  Result Date: 05/14/2022 CLINICAL DATA:  88 year old female with history of hypoxia. EXAM: PORTABLE CHEST 1 VIEW COMPARISON:  Chest x-ray 05/12/2022. FINDINGS: Worsening aeration throughout the left mid to lower lung, concerning for progressive airspace consolidation. Moderate left pleural effusion. Right lung is clear. No right pleural effusion. No pneumothorax. Chronic bilateral apical pleural-parenchymal thickening (right-greater-than-left), similar to prior studies, presumably chronic post infectious or inflammatory scarring. No evidence of pulmonary edema. Heart size is mildly enlarged. Upper mediastinal contours are within normal limits. IMPRESSION: 1. Worsening aeration in the left lower lobe concerning for left lower lobe pneumonia with increasing moderate left parapneumonic pleural effusion. 2. Mild cardiomegaly. 3. Aortic atherosclerosis. Electronically Signed   By: Vinnie Langton M.D.   On: 05/14/2022 07:27   DG Chest 1 View  Result Date: 05/12/2022 CLINICAL DATA:  Shortness of breath EXAM: PORTABLE CHEST 1 VIEW COMPARISON:  02/28/2022 FINDINGS: Cardiac shadow is enlarged but stable. Aortic calcifications are seen. The lungs are well aerated bilaterally. Persistent small effusion is noted on the left although improved when compare with the prior exam. Some linear  atelectatic changes are noted bilaterally. No bony abnormality is seen. IMPRESSION: Improved aeration in the left base with persistent small effusion. Mild basilar atelectasis is noted. Electronically Signed   By: Inez Catalina M.D.   On: 05/12/2022 00:21    ECHO 11/2021 1. Left ventricular ejection fraction, by estimation, is 50 to 55%. The  left ventricle has low normal function. The left ventricle has no regional  wall motion  abnormalities. There is mild left ventricular hypertrophy.  Left ventricular diastolic  parameters were normal.   2. Right ventricular systolic function is normal. The right ventricular  size is normal.   3. The mitral valve is normal in structure. Mild to moderate mitral valve  regurgitation. No evidence of mitral stenosis.   4. Tricuspid valve regurgitation is mild to moderate.   5. The aortic valve is normal in structure. Aortic valve regurgitation is  mild to moderate. No aortic stenosis is present.   6. The inferior vena cava is normal in size with greater than 50%  respiratory variability, suggesting right atrial pressure of 3 mmHg.   TELEMETRY reviewed by me (LT) 05/16/2022 : AF rate 60-70s, PVS  EKG reviewed by me: AF 112 PVC  Data reviewed by me (LT) 05/16/2022: hospitalist progress note, nursing notes. last 24h vitals tele labs imaging I/O    Principal Problem:   Acute on chronic heart failure with preserved ejection fraction Active Problems:   Essential hypertension   Anemia, iron deficiency   Hereditary hemochromatosis   PAD (peripheral artery disease)   Paroxysmal atrial fibrillation   Acute respiratory failure with hypoxia   Hyponatremia   History of pulmonary embolism October 2023    ASSESSMENT AND PLAN:  [name] is a 80yoF with a PMH of paroxysmal AF/pulmonary embolus on eliquis, HFpEF, HTN, HLD, hx colon cancer s/p resection, GERD, GI bleeding who presented to [facility] ED 05/11/2022 in respiratory distress requiring 6L by [place] and BIPAP. She has been diuresing with IV lasix, also had L thoracentesis with 420mL fluid aspirated. UA with pseudomonas and E. Faecalis, also concern for aspiration PNA.   # acute on chronic HFpEF  Initially requiring BIPAP on admission, now weaned to 3L . BNP elevated at 282 on admission.  Clinical improvement after initial IV diuresis.  -Increase to IV Lasix 40 mg twice daily -Continue GDMT with metoprolol XL 25 mg daily.  - monitor I/O   - consider palliative input / consideration of hospice with multiple admissions over the past 6 months, advanced age, recurrent aspiration PNA, and limited mobility   # Acute cystitis Urine culture growing Pseudomonas and Enterococcus faecalis, on Zosyn per primary team  # Chronic AF  Remains in AF on telemetry, rate controlled on 60s to 70s.  Anticoagulated with Eliquis 5 mg twice daily.  Although, with weight less than 60 kg and age >80, she meets criteria for dose reduced Eliquis to 2.5 mg twice daily for stroke prevention.  Consider decreasing this dose after completion of 6 months of AC following her high risk VQ scan/treatment of PE (?Provoked in the setting of COVID in 11/2021)  # Demand ischemia Borderline elevated and flat trending at 21, 20 in the absence of chest pain in the setting of acute on chronic HFpEF this is  most consistent with demand/supply mismatch and not ACS  - continue simvastatin   This patient's plan of care was discussed and created with Dr. Clayborn Bigness and he is in agreement.  Signed: Tristan Schroeder , PA-C 05/16/2022,  9:40 AM [facility]

## 2022-05-16 NOTE — TOC Initial Note (Signed)
Transition of Care [facility]) - Initial/Assessment Note    Patient Details  Name: [name] MRN: [id number] Date of Birth: 16-Mar-1928  Transition of Care [facility] CM/SW Contact:    Laurena Slimmer, RN Phone Number: 05/16/2022, 3:29 PM  Clinical Narrative:                 Per Gena at facility patient is LTC and private pay. She is able to return to previous room as it has been secured with payment.         Patient Goals and CMS Choice            Expected Discharge Plan and Services                                              Prior Living Arrangements/Services                       Activities of Daily Living      Permission Sought/Granted                  Emotional Assessment              Admission diagnosis:  Respiratory distress [R06.03] Hypoxia [R09.02] CHF exacerbation [I50.9] Acute on chronic diastolic congestive heart failure [I50.33] Patient Active Problem List   Diagnosis Date Noted   History of pulmonary embolism October 2023 05/12/2022   DNR (do not resuscitate) 03/01/2022   Chronic diastolic CHF (congestive heart failure) 02/28/2022   Normocytic anemia 02/28/2022   Acute renal failure superimposed on stage 3a chronic kidney disease [id number]   Acute metabolic encephalopathy Q000111Q   Aspiration pneumonia 02/28/2022   (HFpEF) heart failure with preserved ejection fraction 02/19/2022   SOB (shortness of breath) 12/14/2021   Pulmonary embolism 12/14/2021   Shortness of breath 12/13/2021   Pneumonia due to COVID-19 virus 12/13/2021   Hyperkalemia 12/13/2021   Hyponatremia 12/13/2021   At risk for polypharmacy 12/13/2021   Acute on chronic diastolic heart failure [id number]   Acute on chronic heart failure with preserved ejection fraction 11/29/2021   Paroxysmal atrial fibrillation 11/16/2021   Blood loss anemia 11/16/2021   Acute respiratory failure with hypoxia 11/16/2021   Elevated temperature 11/16/2021   Aortic  atherosclerosis 08/02/2020   Fall 12/29/2019   Weakness 06/09/2019   Wheezing 04/16/2019   Back skin lesion 12/04/2018   Restless legs syndrome 11/25/2018   PAD (peripheral artery disease) 04/07/2018   Cold foot 05/15/2017   Constipation 08/15/2016   AKI (acute kidney injury) 04/13/2016   Small bowel obstruction 11/24/2015   Swelling of left lower extremity 07/16/2015   Acute Ileitis 06/12/2015   Hypokalemia 06/12/2015   Accelerated hypertension 06/12/2015   Hereditary hemochromatosis 05/25/2015   Open leg wound 05/19/2015   Lower extremity edema 05/19/2015   Iron excess 04/16/2015   Dizziness 10/26/2014   Hyperbilirubinemia 10/26/2014   Health care maintenance 05/18/2014   Anemia, iron deficiency 04/21/2014   Hospital discharge follow-up 04/14/2014   Hot flashes 03/07/2014   Face lesion 11/03/2013   Numbness in feet 10/08/2012   Nocturia 10/08/2012   Essential hypertension 12/18/2011   Hypercholesteremia 12/18/2011   History of colon cancer 12/18/2011   Barrett's esophagus 12/18/2011   Chronic back pain 12/18/2011   Osteopenia 12/18/2011   PCP:  [name], MD Pharmacy:   [facility] [address]  [place], [place] [address] [place] [address] Phone: [phone] Fax: [phone]  [facility] [place], [address] [place] [address] Phone: [phone] Fax: [phone]     Social Determinants of Health (SDOH) Social History: [place]: No Food Insecurity (05/12/2022)  Housing: Low Risk  (05/12/2022)  Transportation Needs: No Transportation Needs (05/12/2022)  Utilities: Not At Risk (05/12/2022)  Depression (PHQ2-9): Low Risk  (11/30/2020)  Financial Resource Strain: Low Risk  (11/30/2020)  Physical Activity: Unknown (11/30/2020)  Social Connections: Unknown (11/30/2020)  Stress: No Stress Concern Present (11/30/2020)  Tobacco Use:  Low Risk  (05/12/2022)   SDOH Interventions:     Readmission Risk Interventions    03/02/2022   12:47 PM 02/21/2022    9:54 AM  Readmission Risk Prevention Plan  Transportation Screening Complete Complete  Medication Review ([place]) Complete   PCP or Specialist appointment within 3-5 days of discharge Complete   HRI or Home Care Consult Complete Complete  SW Recovery Care/Counseling Consult Complete   Palliative Care Screening Complete Complete  Skilled Nursing Facility Complete

## 2022-05-16 NOTE — Progress Notes (Signed)
PROGRESS NOTE    [name]  [id number] DOB: 1928-09-05 DOA: 05/11/2022 PCP: [name], MD   Brief Narrative:  89 year old female with history of CHF with preserved EF, A-fib, PE on Eliquis, HTN, GI bleed, hereditary hemochromatosis, colon cancer status postsurgical resection, GERD, ambulatory dysfunction comes to the ED for acute respiratory distress requiring 6 L nasal cannula.  Upon admission on room air she was hypoxic saturating as low as 70%.  Initially placed on BiPAP.  COVID swab was negative.  She did have COVID about 6 months ago.  In the ED patient was treated with IV diuretics. Due to worsening resp status, her lasix was increased. Added Abx for possible aspiration.  She underwent thoracentesis, 400 cc removed. Slowly improving.    Assessment & Plan:  Principal Problem:   Acute on chronic heart failure with preserved ejection fraction Active Problems:   Acute respiratory failure with hypoxia   Paroxysmal atrial fibrillation   Hyponatremia   Essential hypertension   Anemia, iron deficiency   Hereditary hemochromatosis   PAD (peripheral artery disease)   History of pulmonary embolism October 2023     Assessment and Plan: * Acute on chronic heart failure with preserved ejection fraction (HCC) Acute on chronic respiratory failure with hypoxia; 2L [place] at home Symptomatic anemia Procalcitonin negative, getting aggressive diuresis.  Cardiology following. - Echocardiogram in October 2023 showed EF of 50-55%. -[place].   UTI, Pseudomonas and Enterococcus Aspiration/parapneumonic effusion.  Large hiatal hernia Continue azithromycin (total 5 days), change Unasyn to Zosyn (total 5-7 days) Bronchodilators. IS/Flutter.  Seen by S&S. Large Hiatal Hernia- PPI BID  Solumedrol S/P Thoracentesis 400cc removed. Body fluid neg for infection.   Hypokalemia Repletion per pharmacy  Paroxysmal atrial fibrillation (HCC) Continue Eliquis and metoprolol Replete  electrolytes as appropriate  Hyponatremia Resolved.   Essential hypertension Continue metoprolol.  IV as needed  Anemia, iron deficiency History of blood loss anemia related to bruising from anticoagulation Baseline hemoglobin is around 7.5.  Hb 7.3 s/p 1U PRBC. Hb 10.2  History of pulmonary embolism October 2023 This was presumed to be in the setting of COVID-19 pneumonia.  Continue Eliquis for now  PAD (peripheral artery disease) ([place]) Continue simvastatin and apixaban  Patient is mostly bedbound for the past 4-5 months at home. Overall very sick given her age and coomorbidites  Palliative care consulted due to recurrent aspirations.   DVT prophylaxis: Eliquis Code Status: DNR Family Communication: Husband at bedside  Status is: Inpatient Still volume overloaded requiring IV diuretics Eventually plans to go home.  Hopefully next 24-48 hours  Subjective: Patient feels a little better today.  Denies any overt shortness of breath.  Still coughing  Examination: Constitutional: Not in acute distress.  Elderly frail.  2 L nasal cannula saturating over 95% Respiratory: Bibasilar rhonchi Cardiovascular: Normal sinus rhythm, no rubs Abdomen: Nontender nondistended good bowel sounds Musculoskeletal: No edema noted Skin: No rashes seen Neurologic: CN 2-12 grossly intact.  And nonfocal Psychiatric: Normal judgment and insight. Alert and oriented x 3. Normal mood. Objective: Vitals:   05/15/22 2014 05/16/22 0043 05/16/22 0330 05/16/22 0813  BP:    139/68  Pulse:    67  Resp:    18  Temp: 98.8 F (37.1 C) 97.7 F (36.5 C) (!) 97.2 F (36.2 C) 98.3 F (36.8 C)  TempSrc:    Oral  SpO2: 100%   98%  Weight:        Intake/Output Summary (Last 24 hours) at 05/16/2022 0901 Last  data filed at 05/16/2022 0328 Gross per 24 hour  Intake 1353.29 ml  Output 1750 ml  Net -396.71 ml   Filed Weights   05/13/22 0829 05/14/22 0704 05/15/22 0500  Weight: 60.8 kg 62.8 kg 62 kg      Data Reviewed:   CBC: Recent Labs  Lab 05/12/22 0009 05/12/22 0540 05/12/22 1744 05/13/22 0653 05/14/22 0335 05/15/22 0537 05/16/22 0626  WBC 11.4*  --   --  15.2* 9.7 7.7 9.6  NEUTROABS 8.8*  --   --   --   --   --   --   HGB 7.3*   < > 8.5* 9.4* 9.8* 9.5* 10.2*  HCT 24.3*  --   --  28.6* 31.4* 30.2* 32.4*  MCV 85.3  --   --  82.2 85.6 85.3 84.2  PLT 477*  --   --  460* 472* 481* 524*   < > = values in this interval not displayed.   Basic Metabolic Panel: Recent Labs  Lab 05/12/22 0009 05/13/22 [id number] 05/13/22 0929 05/14/22 0335 05/15/22 0537 05/15/22 1629 05/16/22 0626  NA 131* 136  --  134* 139  --  137  K 3.7 5.5* 3.3* 3.3* 2.8* 4.6 3.3*  CL 94* 98  --  89* 94*  --  91*  CO2 26 28  --  33* 37*  --  34*  GLUCOSE 127* 110*  --  115* 114*  --  101*  BUN 20 28*  --  24* 26*  --  23  CREATININE 0.79 0.70  --  0.72 0.77  --  0.82  CALCIUM 8.9 8.9  --  8.7* 8.7*  --  9.1  MG  --  2.4  --  1.9 2.1  --  2.0   GFR: Estimated Creatinine Clearance: 38.7 mL/min (by C-G formula based on SCr of 0.82 mg/dL). Liver Function Tests: Recent Labs  Lab 05/12/22 0009  AST 15  ALT 9  ALKPHOS 54  BILITOT 0.8  PROT 6.8  ALBUMIN 3.2*   No results for input(s): "LIPASE", "AMYLASE" in the last 168 hours. No results for input(s): "AMMONIA" in the last 168 hours. Coagulation Profile: Recent Labs  Lab 05/12/22 0041  INR 2.1*   Cardiac Enzymes: No results for input(s): "CKTOTAL", "CKMB", "CKMBINDEX", "TROPONINI" in the last 168 hours. BNP (last 3 results) No results for input(s): "PROBNP" in the last 8760 hours. HbA1C: No results for input(s): "HGBA1C" in the last 72 hours. CBG: No results for input(s): "GLUCAP" in the last 168 hours. Lipid Profile: No results for input(s): "CHOL", "HDL", "LDLCALC", "TRIG", "CHOLHDL", "LDLDIRECT" in the last 72 hours. Thyroid Function Tests: No results for input(s): "TSH", "T4TOTAL", "FREET4", "T3FREE", "THYROIDAB" in the last 72  hours. Anemia Panel: No results for input(s): "VITAMINB12", "FOLATE", "FERRITIN", "TIBC", "IRON", "RETICCTPCT" in the last 72 hours. Sepsis Labs: Recent Labs  Lab 05/12/22 0009 05/13/22 0929  PROCALCITON  --  <0.10  LATICACIDVEN 0.9  --     Recent Results (from the past 240 hour(s))  SARS Coronavirus 2 by RT PCR (hospital order, performed in [facility] hospital lab) *cepheid single result test* Anterior Nasal Swab     Status: None   Collection Time: 05/12/22 12:09 AM   Specimen: Anterior Nasal Swab  Result Value Ref Range Status   SARS Coronavirus 2 by RT PCR NEGATIVE NEGATIVE Final    Comment: (NOTE) SARS-CoV-2 target nucleic acids are NOT DETECTED.  The SARS-CoV-2 RNA is generally detectable in upper and lower respiratory  specimens during the acute phase of infection. The lowest concentration of SARS-CoV-2 viral copies this assay can detect is 250 copies / mL. A negative result does not preclude SARS-CoV-2 infection and should not be used as the sole basis for treatment or other patient management decisions.  A negative result may occur with improper specimen collection / handling, submission of specimen other than nasopharyngeal swab, presence of viral mutation(s) within the areas targeted by this assay, and inadequate number of viral copies (<250 copies / mL). A negative result must be combined with clinical observations, patient history, and epidemiological information.  Fact Sheet for Patients:   https://www.patel.info/  Fact Sheet for Healthcare Providers: https://hall.com/  This test is not yet approved or  cleared by the [place] FDA and has been authorized for detection and/or diagnosis of SARS-CoV-2 by FDA under an Emergency Use Authorization (EUA).  This EUA will remain in effect (meaning this test can be used) for the duration of the COVID-19 declaration under Section 564(b)(1) of the Act, 21 U.S.C. section  360bbb-3(b)(1), unless the authorization is terminated or revoked sooner.  Performed at [facility], [address], [place], [place] [address]   Urine Culture (for pregnant, neutropenic or urologic patients or patients with an indwelling urinary catheter)     Status: Abnormal (Preliminary result)   Collection Time: 05/13/22  9:03 AM   Specimen: Urine, Clean Catch  Result Value Ref Range Status   Specimen Description   Final    URINE, CLEAN CATCH Performed at [facility], [address], [place], [place] [address]    Special Requests   Final    NONE Performed at [facility], [address], [place], [place] [address]    Culture (A)  Final    >=100,000 COLONIES/mL PSEUDOMONAS AERUGINOSA >=100,000 COLONIES/mL ENTEROCOCCUS FAECALIS SUSCEPTIBILITIES TO FOLLOW Performed at [facility], [place] [address] [place], [place] [address]    Report Status PENDING  Incomplete   Organism ID, Bacteria PSEUDOMONAS AERUGINOSA (A)  Final      Susceptibility   Pseudomonas aeruginosa - MIC*    CEFTAZIDIME 4 SENSITIVE Sensitive     CIPROFLOXACIN <=0.25 SENSITIVE Sensitive     GENTAMICIN 2 SENSITIVE Sensitive     IMIPENEM 2 SENSITIVE Sensitive     PIP/TAZO <=4 SENSITIVE Sensitive     CEFEPIME 2 SENSITIVE Sensitive     * >=100,000 COLONIES/mL PSEUDOMONAS AERUGINOSA  Body fluid culture w Gram Stain     Status: None (Preliminary result)   Collection Time: 05/14/22  1:15 PM   Specimen: PATH Cytology Pleural fluid  Result Value Ref Range Status   Specimen Description   Final    PLEURAL Performed at [facility], [address], [place], [place] [address]    Special Requests   Final    CYTO PLEU Performed at [facility], [place], [place] [address]    Gram Stain NO ORGANISMS SEEN NO WBC SEEN   Final   Culture   Final    NO GROWTH < 24 HOURS Performed at [facility], [place] [address] [place], [place] [address]     Report Status PENDING  Incomplete         Radiology Studies: US THORACENTESIS ASP PLEURAL SPACE W/IMG GUIDE  Result Date: 05/14/2022 INDICATION: Patient with pneumonia and left pleural effusion. Request for diagnostic and therapeutic thoracentesis. EXAM: ULTRASOUND GUIDED LEFT THORACENTESIS MEDICATIONS: 1% lidocaine 10 mL COMPLICATIONS: None immediate. PROCEDURE: An ultrasound  guided thoracentesis was thoroughly discussed with the patient and questions answered. The benefits, risks, alternatives and complications were also discussed. The patient understands and wishes to proceed with the procedure. Written consent was obtained. Ultrasound was performed to localize and mark an adequate pocket of fluid in the left chest. The area was then prepped and draped in the normal sterile fashion. 1% Lidocaine was used for local anesthesia. Under ultrasound guidance a 6 Fr Safe-T-Centesis catheter was introduced. Thoracentesis was performed. The catheter was removed and a dressing applied. FINDINGS: A total of approximately 400 mL of clear yellow fluid was removed. Samples were sent to the laboratory as requested by the clinical team. IMPRESSION: Successful ultrasound guided left thoracentesis yielding 400 mL of pleural fluid. No pneumothorax on post-procedure chest x-ray. Procedure performed by: Gareth Eagle, PA-C Electronically Signed   By: Aletta Edouard M.D.   On: 05/14/2022 14:38   DG Chest 1 View  Result Date: 05/14/2022 CLINICAL DATA:  Status post left thoracentesis EXAM: CHEST  1 VIEW COMPARISON:  Ultrasound thoracentesis dated 05/14/2022, chest radiograph dated 05/14/2022 at 7:16 a.m. FINDINGS: Normal lung volumes. Left basilar patchy opacities. Retrocardiac lucency. Minimal blunting of the left costophrenic angle. No definite pneumothorax. Similar cardiomediastinal silhouette. The visualized skeletal structures are unremarkable. IMPRESSION: 1. No definite pneumothorax status post left thoracentesis.  Minimal blunting of the left costophrenic angle may reflect trace residual pleural effusion. 2. Left basilar patchy opacities, likely atelectasis. Aspiration or pneumonia can be considered in the appropriate clinical setting. 3. Retrocardiac lucency, which may represent a hiatal hernia. Electronically Signed   By: Darrin Nipper M.D.   On: 05/14/2022 13:53        Scheduled Meds:  apixaban  5 mg Oral BID   docusate sodium  100 mg Oral BID   furosemide  40 mg Intravenous Q12H   gabapentin  200 mg Oral QHS   ipratropium-albuterol  3 mL Nebulization BID   methylPREDNISolone (SOLU-MEDROL) injection  40 mg Intravenous Daily   metoprolol succinate  25 mg Oral Daily   pantoprazole  40 mg Oral BID   potassium chloride  20 mEq Oral BID   simvastatin  10 mg Oral QHS   Continuous Infusions:  azithromycin 500 mg (05/15/22 1025)   piperacillin-tazobactam (ZOSYN)  IV 3.375 g (05/16/22 0020)     LOS: 4 days   Time spent= 35 mins    Reynard Christoffersen Arsenio Loader, MD [facility]  If 7PM-7AM, please contact night-coverage  05/16/2022, 9:01 AM

## 2022-05-17 DIAGNOSIS — I5033 Acute on chronic diastolic (congestive) heart failure: Secondary | ICD-10-CM | POA: Diagnosis not present

## 2022-05-17 LAB — BASIC METABOLIC PANEL
Anion gap: 9 (ref 5–15)
BUN: 26 mg/dL — ABNORMAL HIGH (ref 8–23)
CO2: 35 mmol/L — ABNORMAL HIGH (ref 22–32)
Calcium: 9 mg/dL (ref 8.9–10.3)
Chloride: 93 mmol/L — ABNORMAL LOW (ref 98–111)
Creatinine, Ser: 0.91 mg/dL (ref 0.44–1.00)
GFR, Estimated: 60 mL/min — ABNORMAL LOW (ref >60)
Glucose, Bld: 100 mg/dL — ABNORMAL HIGH (ref 70–99)
Potassium: 3.7 mmol/L (ref 3.5–5.1)
Sodium: 137 mmol/L (ref 135–145)

## 2022-05-17 LAB — URINE CULTURE: Culture: >=100000 — AB

## 2022-05-17 LAB — CBC
HCT: 32.5 % — ABNORMAL LOW (ref 36.0–46.0)
Hemoglobin: 10.2 g/dL — ABNORMAL LOW (ref 12.0–15.0)
MCH: 26.2 pg (ref 26.0–34.0)
MCHC: 31.4 g/dL (ref 30.0–36.0)
MCV: 83.3 fL (ref 80.0–100.0)
Platelets: 559 10*3/uL — ABNORMAL HIGH (ref 150–400)
RBC: 3.9 MIL/uL (ref 3.87–5.11)
RDW: 15.7 % — ABNORMAL HIGH (ref 11.5–15.5)
WBC: 10.2 10*3/uL (ref 4.0–10.5)
nRBC: 0 % (ref 0.0–0.2)

## 2022-05-17 LAB — CYTOLOGY - NON PAP

## 2022-05-17 LAB — BLOOD GAS, VENOUS
Acid-Base Excess: 15 mmol/L — ABNORMAL HIGH (ref 0.0–2.0)
Bicarbonate: 43.1 mmol/L — ABNORMAL HIGH (ref 20.0–28.0)
Patient temperature: 37
pCO2, Ven: 68 mmHg — ABNORMAL HIGH (ref 44–60)
pH, Ven: 7.41 (ref 7.25–7.43)
pO2, Ven: 38 mmHg (ref 32–45)

## 2022-05-17 LAB — MAGNESIUM: Magnesium: 2.2 mg/dL (ref 1.7–2.4)

## 2022-05-17 MED ORDER — SENNOSIDES-DOCUSATE SODIUM 8.6-50 MG PO TABS
1.0000 | ORAL_TABLET | Freq: Once | ORAL | Status: AC
  Administered 2022-05-17: 1 via ORAL
  Filled 2022-05-17: qty 1

## 2022-05-17 MED ORDER — SPIRONOLACTONE 12.5 MG HALF TABLET
12.5000 mg | ORAL_TABLET | Freq: Every day | ORAL | Status: DC
  Administered 2022-05-17 – 2022-05-19 (×3): 12.5 mg via ORAL
  Filled 2022-05-17 (×3): qty 1

## 2022-05-17 MED ORDER — POTASSIUM CHLORIDE CRYS ER 20 MEQ PO TBCR
20.0000 meq | EXTENDED_RELEASE_TABLET | Freq: Every day | ORAL | Status: DC
  Administered 2022-05-18 – 2022-05-19 (×2): 20 meq via ORAL
  Filled 2022-05-17 (×2): qty 1

## 2022-05-17 NOTE — Consult Note (Signed)
   Heart  Failure Nurse Navigator Note  HFpEF 55%.  Mild left ventricular hypertrophy.  Normal left ventricular diastolic parameters.  Mild to moderate mitral regurgitation.  Mild to moderate tricuspid regurgitation.  Mild to moderate aortic valve regurgitation.  She presented to the emergency room from a skilled facility with complaints of increasing congestion and shortness of breath x 1 week.  BNP 282 chest x-ray with small effusion and mild basilar atelectasis.  She was given 1 dose of 20 mg of IV Lasix and transition from BiPAP to O2 per nasal cannula at 3 L.  Comorbidities:  Anemia Colon cancer /history of chemotherapy GERD Hyperlipidemia Hypertension Osteoarthritis  Medications:  Apixaban 5 mg 2 times a day Furosemide 40 mg IV 2 times a day Metoprolol succinate 25 mg daily Zocor 10 g daily at bedtime Spironolactone 12 and half milligrams daily  Labs:  Sodium 137, potassium 3.7, chloride 93, CO2 35, BUN 26, creatinine 0.91, estimated GFR 60. Weight is 60.2 kg and yesterday's weight was 58.1 kg Intake 240 mL Output 2100 mL  Initial meeting with patient who was lying in bed in no acute distress and her husband who was at the bedside.  Patient's husband does most of the talking and states [facility]  Duration of her stay there he says is unknown that she has been there for 2 months.  He states that the care facility that they do weigh her daily.  Discussed reporting 2 pound weight gain overnight or total of 5 pounds within the week.  States that she does not use salt on her food.  During the interview she continues to ask him if she would like him to make him a peanut butter and banana sandwich.  Also went over fluid restriction and follow-up in the outpatient heart failure clinic on April 8 at 3:30 in the afternoon.  She has a 1% no-show which is 1 out of 197 appointments.  They had no further questions.  [name] RN

## 2022-05-17 NOTE — Progress Notes (Signed)
Progress Note    [name]  [id number] DOB: 1928-07-06  DOA: 05/11/2022 PCP: [name], MD      Brief Narrative:    Medical records reviewed and are as summarized below:  [name] is a 89 year old female with history of CHF with preserved EF, A-fib, PE on Eliquis, HTN, GI bleed, hereditary hemochromatosis, colon cancer status postsurgical resection, GERD, ambulatory dysfunction comes to the ED for acute respiratory distress requiring 6 L nasal cannula. Upon admission on room air she was hypoxic saturating as low as 70%. Initially placed on BiPAP. COVID swab was negative. She did have COVID about 6 months ago. In the ED patient was treated with IV diuretics. Due to worsening resp status, her lasix was increased.  She was also given empiric IV antibiotics for possible aspiration pneumonia.  She underwent left thoracentesis, 400 cc removed.        Assessment/Plan:   Principal Problem:   Acute on chronic heart failure with preserved ejection fraction Active Problems:   Acute respiratory failure with hypoxia   Paroxysmal atrial fibrillation   Hyponatremia   Essential hypertension   Anemia, iron deficiency   Hereditary hemochromatosis   PAD (peripheral artery disease)   History of pulmonary embolism October 2023    Body mass index is 24.27 kg/m.    Acute on chronic heart failure with preserved ejection fraction (HCC) Acute on chronic respiratory failure with hypoxia; 2L [place] at home Symptomatic anemia Procalcitonin negative.  Continue IV Lasix.  Cardiology following. - Echocardiogram in October 2023 showed EF of 50-55%. -[place].    UTI, Pseudomonas and Enterococcus Aspiration/parapneumonic effusion.  Large hiatal hernia Completed azithromycin.  Continue IV Zosyn.   Bronchodilators. IS/Flutter.  Seen by S&S. Large Hiatal Hernia- PPI BID  Continue prednisone. S/P left thoracentesis 400cc removed. Body fluid neg for infection.     Hypokalemia Improved.   Paroxysmal atrial fibrillation (HCC) Continue Eliquis and metoprolol Replete electrolytes as appropriate   Hyponatremia Resolved.    Essential hypertension Continue metoprolol.     Anemia, iron deficiency History of blood loss anemia related to bruising from anticoagulation Baseline hemoglobin is around 7.5.  Hb 7.3 s/p 1U PRBC. Hb 10.2   History of pulmonary embolism October 2023 This was presumed to be in the setting of COVID-19 pneumonia.  Continue Eliquis for now   PAD (peripheral artery disease) ([place]) Continue simvastatin and apixaban     Diet Order             Diet Heart Room service appropriate? Yes with Assist; Fluid consistency: Thin  Diet effective now                            Consultants: Cardiologist  Procedures: Left-sided thoracentesis on 05/14/2022    Medications:    apixaban  5 mg Oral BID   docusate sodium  100 mg Oral BID   furosemide  40 mg Intravenous BID   gabapentin  200 mg Oral QHS   metoprolol succinate  25 mg Oral Daily   pantoprazole  40 mg Oral BID   [START ON 05/18/2022] potassium chloride  20 mEq Oral Daily   predniSONE  40 mg Oral Q breakfast   simvastatin  10 mg Oral QHS   spironolactone  12.5 mg Oral Daily   Continuous Infusions:  piperacillin-tazobactam (ZOSYN)  IV 3.375 g (05/17/22 1012)     Anti-infectives (From admission, onward)    Start  Dose/Rate Route Frequency Ordered Stop   05/15/22 1700  piperacillin-tazobactam (ZOSYN) IVPB 3.375 g        3.375 g 12.5 mL/hr over 240 Minutes Intravenous Every 8 hours 05/15/22 1559     05/14/22 1200  Ampicillin-Sulbactam (UNASYN) 3 g in sodium chloride 0.9 % 100 mL IVPB  Status:  Discontinued        3 g 200 mL/hr over 30 Minutes Intravenous Every 6 hours 05/14/22 0917 05/15/22 1559   05/14/22 1000  azithromycin (ZITHROMAX) 500 mg in sodium chloride 0.9 % 250 mL IVPB        500 mg 250 mL/hr over 60 Minutes Intravenous Every 24 hours  05/14/22 0857 05/16/22 1112              Family Communication/Anticipated D/C date and plan/Code Status   DVT prophylaxis:  apixaban (ELIQUIS) tablet 5 mg     Code Status: DNR  Family Communication: Plan discussed with the husband at the bedside Disposition Plan: Plan to discharge to SNF in 2 days   Status is: Inpatient Remains inpatient appropriate because: On IV Lasix and IV antibiotics CHF suspected aspiration pneumonia respectively       Subjective:   Interval events noted.  She complains of a dry cough.  No shortness of breath or chest pain.  Her husband was at the bedside.  Objective:    Vitals:   05/17/22 0431 05/17/22 0757 05/17/22 0925 05/17/22 1158  BP: (!) 149/89 117/88  115/65  Pulse: 68 68  66  Resp: 19 16  16   Temp: 99.4 F (37.4 C) 97.9 F (36.6 C)  97.6 F (36.4 C)  TempSrc:  Oral  Oral  SpO2: 97% 98%  99%  Weight:   60.2 kg    No data found.   Intake/Output Summary (Last 24 hours) at 05/17/2022 1557 Last data filed at 05/17/2022 1200 Gross per 24 hour  Intake 240 ml  Output 1800 ml  Net -1560 ml   Filed Weights   05/15/22 0500 05/16/22 0813 05/17/22 0925  Weight: 62 kg 58.1 kg 60.2 kg    Exam:  GEN: NAD SKIN: Warm and dry.  Multiple bruises on upper and lower extremities EYES: No pallor or icterus ENT: MMM CV: RRR PULM: Decreased air entry bilaterally, bibasilar rales ABD: soft, ND, NT, +BS CNS: AAO x 3, non focal EXT: No edema or tenderness        Data Reviewed:   I have personally reviewed following labs and imaging studies:  Labs: Labs show the following:   Basic Metabolic Panel: Recent Labs  Lab 05/13/22 0653 05/13/22 0929 05/14/22 0335 05/15/22 0537 05/15/22 1629 05/16/22 0626 05/17/22 0537  NA 136  --  134* 139  --  137 137  K 5.5*   < > 3.3* 2.8*   < > 3.3* 3.7  CL 98  --  89* 94*  --  91* 93*  CO2 28  --  33* 37*  --  34* 35*  GLUCOSE 110*  --  115* 114*  --  101* 100*  BUN 28*  --  24* 26*   --  23 26*  CREATININE 0.70  --  0.72 0.77  --  0.82 0.91  CALCIUM 8.9  --  8.7* 8.7*  --  9.1 9.0  MG 2.4  --  1.9 2.1  --  2.0 2.2   < > = values in this interval not displayed.   GFR Estimated Creatinine Clearance: 34.4 mL/min (by C-G formula  based on SCr of 0.91 mg/dL). Liver Function Tests: Recent Labs  Lab 05/12/22 0009  AST 15  ALT 9  ALKPHOS 54  BILITOT 0.8  PROT 6.8  ALBUMIN 3.2*   No results for input(s): "LIPASE", "AMYLASE" in the last 168 hours. No results for input(s): "AMMONIA" in the last 168 hours. Coagulation profile Recent Labs  Lab 05/12/22 0041  INR 2.1*    CBC: Recent Labs  Lab 05/12/22 0009 05/12/22 0540 05/13/22 0653 05/14/22 0335 05/15/22 0537 05/16/22 0626 05/17/22 0537  WBC 11.4*  --  15.2* 9.7 7.7 9.6 10.2  NEUTROABS 8.8*  --   --   --   --   --   --   HGB 7.3*   < > 9.4* 9.8* 9.5* 10.2* 10.2*  HCT 24.3*  --  28.6* 31.4* 30.2* 32.4* 32.5*  MCV 85.3  --  82.2 85.6 85.3 84.2 83.3  PLT 477*  --  460* 472* 481* 524* 559*   < > = values in this interval not displayed.   Cardiac Enzymes: No results for input(s): "CKTOTAL", "CKMB", "CKMBINDEX", "TROPONINI" in the last 168 hours. BNP (last 3 results) No results for input(s): "PROBNP" in the last 8760 hours. CBG: No results for input(s): "GLUCAP" in the last 168 hours. D-Dimer: No results for input(s): "DDIMER" in the last 72 hours. Hgb A1c: No results for input(s): "HGBA1C" in the last 72 hours. Lipid Profile: No results for input(s): "CHOL", "HDL", "LDLCALC", "TRIG", "CHOLHDL", "LDLDIRECT" in the last 72 hours. Thyroid function studies: No results for input(s): "TSH", "T4TOTAL", "T3FREE", "THYROIDAB" in the last 72 hours.  Invalid input(s): "FREET3" Anemia work up: No results for input(s): "VITAMINB12", "FOLATE", "FERRITIN", "TIBC", "IRON", "RETICCTPCT" in the last 72 hours. Sepsis Labs: Recent Labs  Lab 05/12/22 0009 05/13/22 [id number] 05/13/22 0929 05/14/22 0335 05/15/22 0537  05/16/22 0626 05/17/22 0537  PROCALCITON  --   --  <0.10  --   --   --   --   WBC 11.4*   < >  --  9.7 7.7 9.6 10.2  LATICACIDVEN 0.9  --   --   --   --   --   --    < > = values in this interval not displayed.    Microbiology Recent Results (from the past 240 hour(s))  SARS Coronavirus 2 by RT PCR (hospital order, performed in [facility] hospital lab) *cepheid single result test* Anterior Nasal Swab     Status: None   Collection Time: 05/12/22 12:09 AM   Specimen: Anterior Nasal Swab  Result Value Ref Range Status   SARS Coronavirus 2 by RT PCR NEGATIVE NEGATIVE Final    Comment: (NOTE) SARS-CoV-2 target nucleic acids are NOT DETECTED.  The SARS-CoV-2 RNA is generally detectable in upper and lower respiratory specimens during the acute phase of infection. The lowest concentration of SARS-CoV-2 viral copies this assay can detect is 250 copies / mL. A negative result does not preclude SARS-CoV-2 infection and should not be used as the sole basis for treatment or other patient management decisions.  A negative result may occur with improper specimen collection / handling, submission of specimen other than nasopharyngeal swab, presence of viral mutation(s) within the areas targeted by this assay, and inadequate number of viral copies (<250 copies / mL). A negative result must be combined with clinical observations, patient history, and epidemiological information.  Fact Sheet for Patients:   https://www.patel.info/  Fact Sheet for Healthcare Providers: https://hall.com/  This test is not yet  approved or  cleared by the [place] and has been authorized for detection and/or diagnosis of SARS-CoV-2 by FDA under an Emergency Use Authorization (EUA).  This EUA will remain in effect (meaning this test can be used) for the duration of the COVID-19 declaration under Section 564(b)(1) of the Act, 21 U.S.C. section 360bbb-3(b)(1),  unless the authorization is terminated or revoked sooner.  Performed at [facility], [address] [place], [place] [address]   Urine Culture (for pregnant, neutropenic or urologic patients or patients with an indwelling urinary catheter)     Status: Abnormal   Collection Time: 05/13/22  9:03 AM   Specimen: Urine, Clean Catch  Result Value Ref Range Status   Specimen Description   Final    URINE, CLEAN CATCH Performed at [facility], [address], [place], [place] [address]    Special Requests   Final    NONE Performed at [facility], [address], [place], [place] [address]    Culture (A)  Final    >=100,000 COLONIES/mL PSEUDOMONAS AERUGINOSA >=100,000 COLONIES/mL ENTEROCOCCUS FAECALIS VANCOMYCIN RESISTANT ENTEROCOCCUS ISOLATED    Report Status 05/17/2022 FINAL  Final   Organism ID, Bacteria PSEUDOMONAS AERUGINOSA (A)  Final   Organism ID, Bacteria ENTEROCOCCUS FAECALIS (A)  Final      Susceptibility   Enterococcus faecalis - MIC*    AMPICILLIN <=2 SENSITIVE Sensitive     NITROFURANTOIN <=16 SENSITIVE Sensitive     VANCOMYCIN >=32 RESISTANT Resistant     LINEZOLID Value in next row Sensitive      SENSITIVE2    * >=100,000 COLONIES/mL ENTEROCOCCUS FAECALIS   Pseudomonas aeruginosa - MIC*    CEFTAZIDIME Value in next row Sensitive      SENSITIVE2    CIPROFLOXACIN Value in next row Sensitive      SENSITIVE2    GENTAMICIN Value in next row Sensitive      SENSITIVE2    IMIPENEM Value in next row Sensitive      SENSITIVE2    PIP/TAZO Value in next row Sensitive      SENSITIVE2    CEFEPIME Value in next row Sensitive      SENSITIVE2    * >=100,000 COLONIES/mL PSEUDOMONAS AERUGINOSA  Body fluid culture w Gram Stain     Status: None (Preliminary result)   Collection Time: 05/14/22  1:15 PM   Specimen: PATH Cytology Pleural fluid  Result Value Ref Range Status   Specimen Description   Final    PLEURAL Performed at [facility], [address], [place], [place] [address]    Special Requests   Final    CYTO PLEU Performed at [facility], [address] [place], [place] [address]    Gram Stain NO ORGANISMS SEEN NO WBC SEEN   Final   Culture   Final    NO GROWTH 3 DAYS Performed at [facility], [place] [address] [place],  [address]    Report Status PENDING  Incomplete    Procedures and diagnostic studies:  No results found.             LOS: 5 days   Virl Coble  [facility]   Pager on [email or web address]. If 7PM-7AM, please contact night-coverage at [email or web address]     05/17/2022, 3:57 PM

## 2022-05-17 NOTE — Progress Notes (Addendum)
PHARMACY CONSULT NOTE - FOLLOW UP  Pharmacy Consult for Electrolyte Monitoring and Replacement   Recent Labs: Potassium (mmol/L)  Date Value  05/17/2022 3.7   Magnesium (mg/dL)  Date Value  05/17/2022 2.2   Calcium (mg/dL)  Date Value  05/17/2022 9.0   Albumin (g/dL)  Date Value  05/12/2022 3.2 (L)   Phosphorus (mg/dL)  Date Value  03/02/2022 3.8   Sodium (mmol/L)  Date Value  05/17/2022 137     Assessment: 34 YOF with PMH of CHF, Afib, PE, HTN, GI bleed, and cancer presents to the ED with respiratory distress. Most recent K+ is 3.3, subtherapeutic. Pharmacy has been consulted to manage electrolytes.   On lasix 40 mg IV BID.   Goal of Therapy:  Electrolytes WNL  Plan:  Starting spironolactone 12.5 mg daily. Cards PA changed Kcl 20 mEq BID to daily.  F/u electrolytes with AM labs  Oswald Hillock, PharmD  05/17/2022 9:11 AM

## 2022-05-17 NOTE — Progress Notes (Signed)
[place] NOTE       Patient ID: [name] MRN: [id number] DOB/AGE: 1928/11/07 89 y.o.  Admit date: 05/11/2022 Referring Physician Dr. Gerlean Ren Primary Physician Dr. [name] Primary Cardiologist Dr. Saralyn Pilar Reason for Consultation AoCHF  HPI: [name] is a 24yoF with a PMH of paroxysmal AF/pulmonary embolus on eliquis, HFpEF, HTN, HLD, hx colon cancer s/p resection, GERD, GI bleeding who presented to [facility] ED 05/11/2022 in respiratory distress requiring 6L by [place] and BIPAP. She has been diuresing with IV lasix, also had L thoracentesis with 481mL fluid aspirated. UA with pseudomonas and E. Faecalis, also concern for aspiration PNA.   Interval history:  -Feels "good" today, husband helps corroborate history.  Ongoing concern with persistent cough, no chest pain, shortness of breath or worsening peripheral edema - diuresing well, still on supplemental O2  - in AF on tele, rate controlled  Review of systems complete and found to be negative unless listed above     Past Medical History:  Diagnosis Date   Anemia    Barrett's esophagus    BCC (basal cell carcinoma of skin) 03/07/2013   vertex scalp - NODULAR NODULAR PATTERN PATTERN   Bowel obstruction (HCC)    s/p adhesion resection   CHF (congestive heart failure) (HCC)    Chronic back pain    Colon cancer ([place]) 1988 and 1989   adenocarcinoma, s/p resection x  2 and chemo   Diverticulitis    GERD (gastroesophageal reflux disease)    H/O open leg wound    Hiatal hernia    Hypercholesteremia    Hypertension    Lumbar scoliosis    OA (osteoarthritis)    Peripheral neuropathy    Recurrent sinus infections    Renal cyst    S/P chemotherapy, time since greater than 12 weeks    colon cancer   Vertigo     Past Surgical History:  Procedure Laterality Date   ABDOMINAL HYSTERECTOMY  1982   adhesions resected     bowel obstruction   APPENDECTOMY     BREAST BIOPSY Left    negative  06/14/1985   CHOLECYSTECTOMY  1994   COLON RESECTION     x2.  s/p colon cancer   COLON SURGERY  1958 and 1989   For Colon Cancer   EXCISIONAL HEMORRHOIDECTOMY     with tubal ligation   EXPLORATORY LAPAROTOMY  1991   Secondary to SBO    Medications Prior to Admission  Medication Sig Dispense Refill Last Dose   acetaminophen (TYLENOL) 500 MG tablet Take 500 mg by mouth every 6 (six) hours as needed.   unk at unk   apixaban (ELIQUIS) 5 MG TABS tablet Take 5 mg by mouth 2 (two) times daily.   05/11/2022 at 1800   calcium carbonate (OSCAL) 1500 (600 Ca) MG TABS tablet Take 600 mg of elemental calcium by mouth 2 (two) times daily with a meal.   05/11/2022 at 1730   Cholecalciferol (VITAMIN D3) 50 MCG (2000 UT) capsule Take 2,000 Units by mouth daily.   05/11/2022   docusate sodium (COLACE) 100 MG capsule Take 100 mg by mouth 2 (two) times daily.   05/11/2022 at 1800   ferrous sulfate 325 (65 FE) MG EC tablet Take 325 mg by mouth daily with breakfast.   05/11/2022 at 0800   fexofenadine (ALLEGRA) 60 MG tablet TAKE 1 TABLET EVERY DAY (Patient taking differently: Take 60 mg by mouth daily.) 90 tablet 3 05/11/2022 at  0900   furosemide (LASIX) 20 MG tablet Take 1 tablet (20 mg total) by mouth daily. 30 tablet 0 05/11/2022 at 0900   gabapentin (NEURONTIN) 100 MG capsule TAKE 2 CAPSULES AT BEDTIME (Patient taking differently: Take 200 mg by mouth at bedtime. TAKE 2 CAPSULES AT BEDTIME) 180 capsule 1 05/11/2022 at 2000   guaiFENesin-dextromethorphan (ROBITUSSIN DM) 100-10 MG/5ML syrup Take 10 mLs by mouth every 4 (four) hours as needed for cough.   05/11/2022 at unk   ipratropium (ATROVENT) 0.06 % nasal spray Place 2 sprays into both nostrils 4 (four) times daily.   05/11/2022   ipratropium-albuterol (DUONEB) 0.5-2.5 (3) MG/3ML SOLN Take 3 mLs by nebulization every 4 (four) hours as needed.   05/11/2022 at unk   metoprolol succinate (TOPROL-XL) 25 MG 24 hr tablet TAKE 1 TABLET TWICE DAILY (Patient taking differently:  Take 25 mg by mouth in the morning and at bedtime.) 180 tablet 1 05/11/2022 at 1800   pantoprazole (PROTONIX) 40 MG tablet Take 40 mg by mouth 2 (two) times daily.   05/11/2022 at 1700   potassium chloride SA (KLOR-CON M) 20 MEQ tablet Take 1 tablet (20 mEq total) by mouth daily.   05/11/2022 at 0800   [EXPIRED] predniSONE (DELTASONE) 5 MG tablet Take 5 mg by mouth daily with breakfast.   05/11/2022 at 0900   simvastatin (ZOCOR) 10 MG tablet TAKE 1 TABLET AT BEDTIME 90 tablet 3 05/11/2022 at 2000   albuterol (VENTOLIN HFA) 108 (90 Base) MCG/ACT inhaler TAKE 2 PUFFS BY MOUTH EVERY 6 HOURS AS NEEDED FOR WHEEZE OR SHORTNESS OF BREATH (Patient not taking: Reported on 05/12/2022) 18 g 1 Not Taking   Social History   Socioeconomic History   Marital status: Married    Spouse name: Not on file   Number of children: 4   Years of education: 12th grade   Highest education level: Not on file  Occupational History   Occupation: homemaker  Tobacco Use   Smoking status: Never   Smokeless tobacco: Never  Vaping Use   Vaping Use: Never used  Substance and Sexual Activity   Alcohol use: No    Alcohol/week: 0.0 standard drinks of alcohol   Drug use: No   Sexual activity: Not Currently  Other Topics Concern   Not on file  Social History Narrative   Not on file   Social Determinants of Health   Financial Resource Strain: Low Risk  (11/30/2020)   Overall Financial Resource Strain (CARDIA)    Difficulty of Paying Living Expenses: Not hard at all  Food Insecurity: No Food Insecurity (05/12/2022)   Hunger Vital Sign    Worried About Running Out of Food in the Last Year: Never true    Ran Out of Food in the Last Year: Never true  Transportation Needs: No Transportation Needs (05/12/2022)   PRAPARE - Hydrologist (Medical): No    Lack of Transportation (Non-Medical): No  Physical Activity: Unknown (11/30/2020)   Exercise Vital Sign    Days of Exercise per Week: 0 days     Minutes of Exercise per Session: Not on file  Stress: No Stress Concern Present (11/30/2020)   [place]    Feeling of Stress : Not at all  Social Connections: Unknown (11/30/2020)   Social Connection and Isolation Panel [NHANES]    Frequency of Communication with Friends and Family: More than three times a week    Frequency of Social  Gatherings with Friends and Family: More than three times a week    Attends Religious Services: Not on file    Active Member of Clubs or Organizations: Not on file    Attends Archivist Meetings: Not on file    Marital Status: Married  Intimate Partner Violence: Not At Risk (05/12/2022)   Humiliation, Afraid, Rape, and Kick questionnaire    Fear of Current or Ex-Partner: No    Emotionally Abused: No    Physically Abused: No    Sexually Abused: No    Family History  Problem Relation Age of Onset   Breast cancer Mother    Asthma Mother    Stroke Father    Hypertension Father    Diabetes Father    Ovarian cancer Sister        x2   Prostate cancer Brother    Hypertension Brother        x3   Heart disease Brother    Hypercholesterolemia Brother        x3   Diabetes Brother    Spina bifida Grandchild    Hematuria Neg Hx    Kidney cancer Neg Hx    Kidney disease Neg Hx    Sickle cell trait Neg Hx    Tuberculosis Neg Hx       Intake/Output Summary (Last 24 hours) at 05/17/2022 1558 Last data filed at 05/17/2022 1200 Gross per 24 hour  Intake 240 ml  Output 1800 ml  Net -1560 ml     Vitals:   05/17/22 0431 05/17/22 0757 05/17/22 0925 05/17/22 1158  BP: (!) 149/89 117/88  115/65  Pulse: 68 68  66  Resp: 19 16  16   Temp: 99.4 F (37.4 C) 97.9 F (36.6 C)  97.6 F (36.4 C)  TempSrc:  Oral  Oral  SpO2: 97% 98%  99%  Weight:   60.2 kg     PHYSICAL EXAM General: elderly and frail appearing caucasian female, in no acute distress. Sitting upright in bed with  husband at bedside.  HEENT:  Normocephalic and atraumatic. Neck:  No JVD.  Lungs: Normal respiratory effort on O2 by [place]. Decreased breath sounds with bibasilar crackles.  Heart: irregularly irregular with controlled rate . Normal S1 and S2 without gallops or murmurs.  Abdomen: Non-distended appearing.  Msk: Normal strength and tone for age. Extremities: Warm and well perfused. No clubbing, cyanosis. No peripheral  edema.  Neuro: Awake and alert, pleasantly confused Psych:  Answers questions appropriately.   Labs: Basic Metabolic Panel: Recent Labs    05/16/22 0626 05/17/22 0537  NA 137 137  K 3.3* 3.7  CL 91* 93*  CO2 34* 35*  GLUCOSE 101* 100*  BUN 23 26*  CREATININE 0.82 0.91  CALCIUM 9.1 9.0  MG 2.0 2.2    Liver Function Tests: No results for input(s): "AST", "ALT", "ALKPHOS", "BILITOT", "PROT", "ALBUMIN" in the last 72 hours. No results for input(s): "LIPASE", "AMYLASE" in the last 72 hours. CBC: Recent Labs    05/16/22 0626 05/17/22 0537  WBC 9.6 10.2  HGB 10.2* 10.2*  HCT 32.4* 32.5*  MCV 84.2 83.3  PLT 524* 559*    Cardiac Enzymes: No results for input(s): "CKTOTAL", "CKMB", "CKMBINDEX", "TROPONINIHS" in the last 72 hours. BNP: No results for input(s): "BNP" in the last 72 hours. D-Dimer: No results for input(s): "DDIMER" in the last 72 hours. Hemoglobin A1C: No results for input(s): "HGBA1C" in the last 72 hours. Fasting Lipid Panel: No results for input(s): "  CHOL", "HDL", "LDLCALC", "TRIG", "CHOLHDL", "LDLDIRECT" in the last 72 hours. Thyroid Function Tests: No results for input(s): "TSH", "T4TOTAL", "T3FREE", "THYROIDAB" in the last 72 hours.  Invalid input(s): "FREET3" Anemia Panel: No results for input(s): "VITAMINB12", "FOLATE", "FERRITIN", "TIBC", "IRON", "RETICCTPCT" in the last 72 hours.   Radiology: US THORACENTESIS ASP PLEURAL SPACE W/IMG GUIDE  Result Date: 05/14/2022 INDICATION: Patient with pneumonia and left pleural effusion. Request  for diagnostic and therapeutic thoracentesis. EXAM: ULTRASOUND GUIDED LEFT THORACENTESIS MEDICATIONS: 1% lidocaine 10 mL COMPLICATIONS: None immediate. PROCEDURE: An ultrasound guided thoracentesis was thoroughly discussed with the patient and questions answered. The benefits, risks, alternatives and complications were also discussed. The patient understands and wishes to proceed with the procedure. Written consent was obtained. Ultrasound was performed to localize and mark an adequate pocket of fluid in the left chest. The area was then prepped and draped in the normal sterile fashion. 1% Lidocaine was used for local anesthesia. Under ultrasound guidance a 6 Fr Safe-T-Centesis catheter was introduced. Thoracentesis was performed. The catheter was removed and a dressing applied. FINDINGS: A total of approximately 400 mL of clear yellow fluid was removed. Samples were sent to the laboratory as requested by the clinical team. IMPRESSION: Successful ultrasound guided left thoracentesis yielding 400 mL of pleural fluid. No pneumothorax on post-procedure chest x-ray. Procedure performed by: Gareth Eagle, PA-C Electronically Signed   By: Aletta Edouard M.D.   On: 05/14/2022 14:38   DG Chest 1 View  Result Date: 05/14/2022 CLINICAL DATA:  Status post left thoracentesis EXAM: CHEST  1 VIEW COMPARISON:  Ultrasound thoracentesis dated 05/14/2022, chest radiograph dated 05/14/2022 at 7:16 a.m. FINDINGS: Normal lung volumes. Left basilar patchy opacities. Retrocardiac lucency. Minimal blunting of the left costophrenic angle. No definite pneumothorax. Similar cardiomediastinal silhouette. The visualized skeletal structures are unremarkable. IMPRESSION: 1. No definite pneumothorax status post left thoracentesis. Minimal blunting of the left costophrenic angle may reflect trace residual pleural effusion. 2. Left basilar patchy opacities, likely atelectasis. Aspiration or pneumonia can be considered in the appropriate clinical  setting. 3. Retrocardiac lucency, which may represent a hiatal hernia. Electronically Signed   By: Darrin Nipper M.D.   On: 05/14/2022 13:53   DG Chest Port 1 View  Result Date: 05/14/2022 CLINICAL DATA:  89 year old female with history of hypoxia. EXAM: PORTABLE CHEST 1 VIEW COMPARISON:  Chest x-ray 05/12/2022. FINDINGS: Worsening aeration throughout the left mid to lower lung, concerning for progressive airspace consolidation. Moderate left pleural effusion. Right lung is clear. No right pleural effusion. No pneumothorax. Chronic bilateral apical pleural-parenchymal thickening (right-greater-than-left), similar to prior studies, presumably chronic post infectious or inflammatory scarring. No evidence of pulmonary edema. Heart size is mildly enlarged. Upper mediastinal contours are within normal limits. IMPRESSION: 1. Worsening aeration in the left lower lobe concerning for left lower lobe pneumonia with increasing moderate left parapneumonic pleural effusion. 2. Mild cardiomegaly. 3. Aortic atherosclerosis. Electronically Signed   By: Vinnie Langton M.D.   On: 05/14/2022 07:27   DG Chest 1 View  Result Date: 05/12/2022 CLINICAL DATA:  Shortness of breath EXAM: PORTABLE CHEST 1 VIEW COMPARISON:  02/28/2022 FINDINGS: Cardiac shadow is enlarged but stable. Aortic calcifications are seen. The lungs are well aerated bilaterally. Persistent small effusion is noted on the left although improved when compare with the prior exam. Some linear atelectatic changes are noted bilaterally. No bony abnormality is seen. IMPRESSION: Improved aeration in the left base with persistent small effusion. Mild basilar atelectasis is noted. Electronically Signed   By: Elta Guadeloupe  Lukens M.D.   On: 05/12/2022 00:21    ECHO 11/2021 1. Left ventricular ejection fraction, by estimation, is 50 to 55%. The  left ventricle has low normal function. The left ventricle has no regional  wall motion abnormalities. There is mild left ventricular  hypertrophy.  Left ventricular diastolic  parameters were normal.   2. Right ventricular systolic function is normal. The right ventricular  size is normal.   3. The mitral valve is normal in structure. Mild to moderate mitral valve  regurgitation. No evidence of mitral stenosis.   4. Tricuspid valve regurgitation is mild to moderate.   5. The aortic valve is normal in structure. Aortic valve regurgitation is  mild to moderate. No aortic stenosis is present.   6. The inferior vena cava is normal in size with greater than 50%  respiratory variability, suggesting right atrial pressure of 3 mmHg.   TELEMETRY reviewed by me (LT) 05/17/2022 : AF rate 60-70s, PVCs  EKG reviewed by me: AF 112 PVC  Data reviewed by me (LT) 05/17/2022: hospitalist progress note, nursing notes. last 24h vitals tele labs imaging I/O    Principal Problem:   Acute on chronic heart failure with preserved ejection fraction Active Problems:   Essential hypertension   Anemia, iron deficiency   Hereditary hemochromatosis   PAD (peripheral artery disease)   Paroxysmal atrial fibrillation   Acute respiratory failure with hypoxia   Hyponatremia   History of pulmonary embolism October 2023    ASSESSMENT AND PLAN:  [name] is a 74yoF with a PMH of paroxysmal AF/pulmonary embolus on eliquis, HFpEF, HTN, HLD, hx colon cancer s/p resection, GERD, GI bleeding who presented to [facility] ED 05/11/2022 in respiratory distress requiring 6L by [place] and BIPAP. She has been diuresing with IV lasix, also had L thoracentesis with 43mL fluid aspirated. UA with pseudomonas and E. Faecalis, also concern for aspiration PNA.   # acute on chronic HFpEF  Initially requiring BIPAP on admission, now weaned to 3L [place]. BNP elevated at 282 on admission.  Clinical improvement after initial IV diuresis.  -Continue IV Lasix 40 mg twice daily for today, likely decreased to once daily dosing tomorrow -Add spironolactone 12.5 mg once daily today to  augment diuresis -Continue GDMT with metoprolol XL 25 mg daily.,   - monitor I/O  - consider palliative input / consideration of hospice with multiple admissions over the past 6 months, advanced age, recurrent aspiration PNA, and limited mobility   # Acute cystitis Urine culture growing Pseudomonas and Enterococcus faecalis, on Zosyn per primary team  # Chronic AF  Remains in AF on telemetry, rate controlled on 60s to 70s.  Anticoagulated with Eliquis 5 mg twice daily.  Although, with weight less than 60 kg and age >74, she meets criteria for dose reduced Eliquis to 2.5 mg twice daily for stroke prevention.  Consider decreasing this dose after completion of 6 months of AC following her high risk VQ scan/treatment of PE (?Provoked in the setting of COVID in 11/2021)  # Demand ischemia Borderline elevated and flat trending at 21, 20 in the absence of chest pain in the setting of acute on chronic HFpEF this is  most consistent with demand/supply mismatch and not ACS  - continue simvastatin   This patient's plan of care was discussed and created with Dr. Saralyn Pilar and he is in agreement.  Signed: Tristan Schroeder , PA-C 05/17/2022, 3:58 PM [facility]

## 2022-05-17 NOTE — Progress Notes (Signed)
Palliative-   Consult received, chart reviewed.  Spoke with daughter, Ihor Dow. Plan to meet at 11am tomorrow.   Mariana Kaufman, AGNP-C Palliative Medicine  No charge

## 2022-05-18 DIAGNOSIS — N390 Urinary tract infection, site not specified: Secondary | ICD-10-CM | POA: Diagnosis not present

## 2022-05-18 DIAGNOSIS — Z7189 Other specified counseling: Secondary | ICD-10-CM

## 2022-05-18 DIAGNOSIS — J690 Pneumonitis due to inhalation of food and vomit: Secondary | ICD-10-CM | POA: Diagnosis not present

## 2022-05-18 DIAGNOSIS — E876 Hypokalemia: Secondary | ICD-10-CM

## 2022-05-18 DIAGNOSIS — I5033 Acute on chronic diastolic (congestive) heart failure: Secondary | ICD-10-CM | POA: Diagnosis not present

## 2022-05-18 DIAGNOSIS — I480 Paroxysmal atrial fibrillation: Secondary | ICD-10-CM

## 2022-05-18 LAB — BODY FLUID CULTURE W GRAM STAIN
Culture: NO GROWTH
Gram Stain: NONE SEEN

## 2022-05-18 LAB — CBC
HCT: 34.7 % — ABNORMAL LOW (ref 36.0–46.0)
Hemoglobin: 11 g/dL — ABNORMAL LOW (ref 12.0–15.0)
MCH: 26.1 pg (ref 26.0–34.0)
MCHC: 31.7 g/dL (ref 30.0–36.0)
MCV: 82.2 fL (ref 80.0–100.0)
Platelets: 580 10*3/uL — ABNORMAL HIGH (ref 150–400)
RBC: 4.22 MIL/uL (ref 3.87–5.11)
RDW: 15.7 % — ABNORMAL HIGH (ref 11.5–15.5)
WBC: 11.3 10*3/uL — ABNORMAL HIGH (ref 4.0–10.5)
nRBC: 0 % (ref 0.0–0.2)

## 2022-05-18 LAB — BASIC METABOLIC PANEL
Anion gap: 11 (ref 5–15)
BUN: 28 mg/dL — ABNORMAL HIGH (ref 8–23)
CO2: 33 mmol/L — ABNORMAL HIGH (ref 22–32)
Calcium: 9.1 mg/dL (ref 8.9–10.3)
Chloride: 90 mmol/L — ABNORMAL LOW (ref 98–111)
Creatinine, Ser: 1.06 mg/dL — ABNORMAL HIGH (ref 0.44–1.00)
GFR, Estimated: 50 mL/min — ABNORMAL LOW (ref >60)
Glucose, Bld: 100 mg/dL — ABNORMAL HIGH (ref 70–99)
Potassium: 3.1 mmol/L — ABNORMAL LOW (ref 3.5–5.1)
Sodium: 134 mmol/L — ABNORMAL LOW (ref 135–145)

## 2022-05-18 LAB — MAGNESIUM: Magnesium: 2.3 mg/dL (ref 1.7–2.4)

## 2022-05-18 MED ORDER — POTASSIUM CHLORIDE CRYS ER 20 MEQ PO TBCR
40.0000 meq | EXTENDED_RELEASE_TABLET | Freq: Once | ORAL | Status: AC
  Administered 2022-05-18: 40 meq via ORAL
  Filled 2022-05-18: qty 2

## 2022-05-18 MED ORDER — FUROSEMIDE 40 MG PO TABS
40.0000 mg | ORAL_TABLET | Freq: Every day | ORAL | Status: DC
  Administered 2022-05-18 – 2022-05-19 (×2): 40 mg via ORAL
  Filled 2022-05-18 (×2): qty 1

## 2022-05-18 NOTE — Consult Note (Addendum)
Consultation Note Date: 05/18/2022   Patient Name: [name]  DOB: Dec 16, 1928  MRN: [id number]  Age / Sex: 89 y.o., female  PCP: [name], MD Referring Physician: Wyvonnia Dusky, MD  Reason for Consultation:  GOC  HPI/Patient Profile: 89 y.o. female  with past medical history of a-fib, HF, HTN, HLD, GERD GI bleeding admitted on 05/11/2022 with respiratory distress due to decompesated heart failure. She was also found to have UTI and aspiration pneumonia. She has had recurrent aspiration pnuemonia and UTI's that have greatly affected her functional status. Has been declining for last several months. Palliative consulted for [name].    Primary Decision Maker HCPOA - daughter Ihor Dow  Discussion: Chart reviewed including labs, progress notes, imaging from this and previous encounters.  Met with patient, her spouse, her daughter and son. Patient resides at [facility] in long term care.  Patient hard of hearing.  She shared that she doesn't like being in the hospital. She finds joy in spending time with her children and grandchildren.  She has been in and out of the hospital frequently over the last few months.  We discussed options of ongoing aggressive medical care with goal of prolonging time vs comfort care and hospice.  Hospice philosophy and services reviewed.  Family would like to initiate hospice services at facility at discharge.     SUMMARY OF RECOMMENDATIONS -Maximize this hospitalization- d/c with hospice services, comfort care when she has recurrent infection which is likely to happen in the near future  On discharge, would recommend scripts for: - Morphine Concentrate 10mg /0.47ml: 5mg  (0.49ml) sublingual every 1 hour as needed for pain or shortness of breath: Disp 18ml - Lorazepam 2mg /ml concentrated solution: 1mg  (0.99ml) sublingual every 4 hours as needed for anxiety: Disp  24ml - Haldol 2mg /ml solution: 0.5mg  (0.34ml) sublingual every 4 hours as needed for agitation or nausea: Disp 41ml    Code Status/Advance Care Planning: DNR   Prognosis:   < 3 months  Discharge Planning: [place] with Hospice  Primary Diagnoses: Present on Admission:  Acute respiratory failure with hypoxia  Paroxysmal atrial fibrillation  Essential hypertension  Acute on chronic heart failure with preserved ejection fraction  PAD (peripheral artery disease)  Hereditary hemochromatosis  Anemia, iron deficiency  Hyponatremia   Review of Systems  Physical Exam  Vital Signs: BP (!) 116/54 (BP Location: Right Arm)   Pulse (!) 56   Temp 98.3 F (36.8 C) (Oral)   Resp 18   Wt 59.5 kg   SpO2 90%   BMI 23.99 kg/m  Pain Scale: 0-10   Pain Score: 0-No pain   SpO2: SpO2: 90 % O2 Device:SpO2: 90 % O2 Flow Rate: .O2 Flow Rate (L/min): 2 L/min  IO: Intake/output summary:  Intake/Output Summary (Last 24 hours) at 05/18/2022 1126 Last data filed at 05/18/2022 0625 Gross per 24 hour  Intake 120 ml  Output 1600 ml  Net -1480 ml    LBM: Last BM Date : 05/12/22 Baseline Weight: Weight: 66.4 kg Most  recent weight: Weight: 59.5 kg       Thank you for this consult. Palliative medicine will continue to follow and assist as needed.   Greater than 50%  of this time was spent counseling and coordinating care related to the above assessment and plan.  Signed by: [name], AGNP-C Palliative Medicine    Please contact Palliative Medicine Team phone at [phone] for questions and concerns.  For individual provider: See Shea Evans

## 2022-05-18 NOTE — Progress Notes (Addendum)
PHARMACY CONSULT NOTE - FOLLOW UP  Pharmacy Consult for Electrolyte Monitoring and Replacement   Recent Labs: Potassium (mmol/L)  Date Value  05/18/2022 3.1 (L)   Magnesium (mg/dL)  Date Value  05/18/2022 2.3   Calcium (mg/dL)  Date Value  05/18/2022 9.1   Albumin (g/dL)  Date Value  05/12/2022 3.2 (L)   Phosphorus (mg/dL)  Date Value  03/02/2022 3.8   Sodium (mmol/L)  Date Value  05/18/2022 134 (L)     Assessment: 53 YOF with PMH of CHF, Afib, PE, HTN, GI bleed, and cancer presents to the ED with respiratory distress. Most recent K+ is 3.3, subtherapeutic. Pharmacy has been consulted to manage electrolytes.   On lasix 40 mg IV BID.   Goal of Therapy:  Electrolytes WNL  Plan:  Starting spironolactone 12.5 mg daily. Cards PA changed Kcl 20 mEq daily. Will give an additional 40 mEq today. Recommend changing Kcl mEq daily to BID if continuing current IV lasix, but plan to decrease to daily dosing.  Pharmacy will sign off. Please re-consult if needed.  F/u electrolytes with AM labs  Oswald Hillock, PharmD  05/18/2022 8:05 AM

## 2022-05-18 NOTE — Progress Notes (Signed)
PROGRESS NOTE    [name]  [id number] DOB: 04-Feb-1929 DOA: 05/11/2022 PCP: [name], MD   Assessment & Plan:   Principal Problem:   Acute on chronic heart failure with preserved ejection fraction Active Problems:   Acute respiratory failure with hypoxia   Paroxysmal atrial fibrillation   Hyponatremia   Essential hypertension   Anemia, iron deficiency   Hereditary hemochromatosis   PAD (peripheral artery disease)   History of pulmonary embolism October 2023  Assessment and Plan: Acute on chronic diastolic CHF: echo in 0000000 showed EF 50-55%. D/c IV lasix and started back on po lasix, started on & will continue on aldactone as per cardio. Monitor I/Os. Cardio following and recs apprec  Elevated troponins: minimal. Secondary to demand ischemia. No chest pain   Chronic hypoxic respiratory failure: continue on supplemental oxygen. Uses 2L [place] at home   UTI: urine cx growing pseudomonas and enterococcus. Completed azithromycin but continue on IV zosyn.   Aspiration/parapneumonic effusion: completed azithromycin. Continue on IV zosyn. Hx of large hiatal hernia. Continue on bronchodilators & encourage incentive spirometry & flutter valve. Continue on PPI  S/P left thoracentesis 400cc removed. Body fluid neg for infection.    Hypokalemia: potassium given    PAF: continue on metoprolol, eliquis    Hyponatremia: labile. Will continue to monitor    HTN: continue on metoprolol    IDA: s/p 1 unit of pRBCs transfused so far. Will monitor H&H    Hx of PE: in October 2023 likely in setting of COVID19 pneumonia. Continue on eliquis.    PAD: continue on statin, eliquis     DVT prophylaxis: eliquis Code Status: DNR Family Communication: discussed pt's care w/ pt's husband & daughter at bedside and answered their questions Disposition Plan: likely d/c back to [facility] w/ hospice care   Level of care: Progressive  Status is: Inpatient Remains inpatient appropriate  because: severity of illness    Consultants:  Cardio  Palliative care Hospice   Procedures:  Antimicrobials:   Subjective: Pt c/o fatigue   Objective: Vitals:   05/17/22 2041 05/17/22 2334 05/18/22 0534 05/18/22 0727  BP: (!) 134/58 (!) 137/59 108/60 (!) 116/54  Pulse: 76 70 65 (!) 56  Resp: 20 20 16 18   Temp: 97.6 F (36.4 C)  97.9 F (36.6 C) 98.3 F (36.8 C)  TempSrc: Oral  Axillary Oral  SpO2: 93% 92% 96% 90%  Weight:    59.5 kg    Intake/Output Summary (Last 24 hours) at 05/18/2022 0921 Last data filed at 05/18/2022 [id number] Gross per 24 hour  Intake 480 ml  Output 1600 ml  Net -1120 ml   Filed Weights   05/16/22 0813 05/17/22 0925 05/18/22 0727  Weight: 58.1 kg 60.2 kg 59.5 kg    Examination:  General exam: Appears calm and comfortable  Respiratory system: diminished breath sounds b/l Cardiovascular system: S1 & S2+. No rubs, gallops or clicks.  Gastrointestinal system: Abdomen is nondistended, soft and nontender. Normal bowel sounds heard. Central nervous system: Alert and awake. Moves all extremities  Psychiatry: Judgement and insight appears at baseline. Flat mood and affect     Data Reviewed: I have personally reviewed following labs and imaging studies  CBC: Recent Labs  Lab 05/12/22 0009 05/12/22 0540 05/14/22 0335 05/15/22 0537 05/16/22 0626 05/17/22 0537 05/18/22 0554  WBC 11.4*   < > 9.7 7.7 9.6 10.2 11.3*  NEUTROABS 8.8*  --   --   --   --   --   --  HGB 7.3*   < > 9.8* 9.5* 10.2* 10.2* 11.0*  HCT 24.3*   < > 31.4* 30.2* 32.4* 32.5* 34.7*  MCV 85.3   < > 85.6 85.3 84.2 83.3 82.2  PLT 477*   < > 472* 481* 524* 559* 580*   < > = values in this interval not displayed.   Basic Metabolic Panel: Recent Labs  Lab 05/14/22 0335 05/15/22 0537 05/15/22 1629 05/16/22 0626 05/17/22 0537 05/18/22 0554  NA 134* 139  --  137 137 134*  K 3.3* 2.8* 4.6 3.3* 3.7 3.1*  CL 89* 94*  --  91* 93* 90*  CO2 33* 37*  --  34* 35* 33*  GLUCOSE  115* 114*  --  101* 100* 100*  BUN 24* 26*  --  23 26* 28*  CREATININE 0.72 0.77  --  0.82 0.91 1.06*  CALCIUM 8.7* 8.7*  --  9.1 9.0 9.1  MG 1.9 2.1  --  2.0 2.2 2.3   GFR: Estimated Creatinine Clearance: 27.3 mL/min (A) (by C-G formula based on SCr of 1.06 mg/dL (H)). Liver Function Tests: Recent Labs  Lab 05/12/22 0009  AST 15  ALT 9  ALKPHOS 54  BILITOT 0.8  PROT 6.8  ALBUMIN 3.2*   No results for input(s): "LIPASE", "AMYLASE" in the last 168 hours. No results for input(s): "AMMONIA" in the last 168 hours. Coagulation Profile: Recent Labs  Lab 05/12/22 0041  INR 2.1*   Cardiac Enzymes: No results for input(s): "CKTOTAL", "CKMB", "CKMBINDEX", "TROPONINI" in the last 168 hours. BNP (last 3 results) No results for input(s): "PROBNP" in the last 8760 hours. HbA1C: No results for input(s): "HGBA1C" in the last 72 hours. CBG: No results for input(s): "GLUCAP" in the last 168 hours. Lipid Profile: No results for input(s): "CHOL", "HDL", "LDLCALC", "TRIG", "CHOLHDL", "LDLDIRECT" in the last 72 hours. Thyroid Function Tests: No results for input(s): "TSH", "T4TOTAL", "FREET4", "T3FREE", "THYROIDAB" in the last 72 hours. Anemia Panel: No results for input(s): "VITAMINB12", "FOLATE", "FERRITIN", "TIBC", "IRON", "RETICCTPCT" in the last 72 hours. Sepsis Labs: Recent Labs  Lab 05/12/22 0009 05/13/22 0929  PROCALCITON  --  <0.10  LATICACIDVEN 0.9  --     Recent Results (from the past 240 hour(s))  SARS Coronavirus 2 by RT PCR (hospital order, performed in [facility] hospital lab) *cepheid single result test* Anterior Nasal Swab     Status: None   Collection Time: 05/12/22 12:09 AM   Specimen: Anterior Nasal Swab  Result Value Ref Range Status   SARS Coronavirus 2 by RT PCR NEGATIVE NEGATIVE Final    Comment: (NOTE) SARS-CoV-2 target nucleic acids are NOT DETECTED.  The SARS-CoV-2 RNA is generally detectable in upper and lower respiratory specimens during the acute  phase of infection. The lowest concentration of SARS-CoV-2 viral copies this assay can detect is 250 copies / mL. A negative result does not preclude SARS-CoV-2 infection and should not be used as the sole basis for treatment or other patient management decisions.  A negative result may occur with improper specimen collection / handling, submission of specimen other than nasopharyngeal swab, presence of viral mutation(s) within the areas targeted by this assay, and inadequate number of viral copies (<250 copies / mL). A negative result must be combined with clinical observations, patient history, and epidemiological information.  Fact Sheet for Patients:   https://www.patel.info/  Fact Sheet for Healthcare Providers: https://hall.com/  This test is not yet approved or  cleared by the [place] FDA and has  been authorized for detection and/or diagnosis of SARS-CoV-2 by FDA under an Emergency Use Authorization (EUA).  This EUA will remain in effect (meaning this test can be used) for the duration of the COVID-19 declaration under Section 564(b)(1) of the Act, 21 U.S.C. section 360bbb-3(b)(1), unless the authorization is terminated or revoked sooner.  Performed at [facility], [address] [place], [place] [address]   Urine Culture (for pregnant, neutropenic or urologic patients or patients with an indwelling urinary catheter)     Status: Abnormal   Collection Time: 05/13/22  9:03 AM   Specimen: Urine, Clean Catch  Result Value Ref Range Status   Specimen Description   Final    URINE, CLEAN CATCH Performed at [facility], [address], [place], [place] [address]    Special Requests   Final    NONE Performed at [facility], [address] [place], [place] [address]    Culture (A)  Final    >=100,000 COLONIES/mL PSEUDOMONAS AERUGINOSA >=100,000 COLONIES/mL ENTEROCOCCUS FAECALIS VANCOMYCIN  RESISTANT ENTEROCOCCUS ISOLATED    Report Status 05/17/2022 FINAL  Final   Organism ID, Bacteria PSEUDOMONAS AERUGINOSA (A)  Final   Organism ID, Bacteria ENTEROCOCCUS FAECALIS (A)  Final      Susceptibility   Enterococcus faecalis - MIC*    AMPICILLIN <=2 SENSITIVE Sensitive     NITROFURANTOIN <=16 SENSITIVE Sensitive     VANCOMYCIN >=32 RESISTANT Resistant     LINEZOLID Value in next row Sensitive      SENSITIVE2    * >=100,000 COLONIES/mL ENTEROCOCCUS FAECALIS   Pseudomonas aeruginosa - MIC*    CEFTAZIDIME Value in next row Sensitive      SENSITIVE2    CIPROFLOXACIN Value in next row Sensitive      SENSITIVE2    GENTAMICIN Value in next row Sensitive      SENSITIVE2    IMIPENEM Value in next row Sensitive      SENSITIVE2    PIP/TAZO Value in next row Sensitive      SENSITIVE2    CEFEPIME Value in next row Sensitive      SENSITIVE2    * >=100,000 COLONIES/mL PSEUDOMONAS AERUGINOSA  Body fluid culture w Gram Stain     Status: None (Preliminary result)   Collection Time: 05/14/22  1:15 PM   Specimen: PATH Cytology Pleural fluid  Result Value Ref Range Status   Specimen Description   Final    PLEURAL Performed at [facility] [place], [address], [place], [place] [address]    Special Requests   Final    CYTO PLEU Performed at [facility], [address] [place], [place] [address]    Gram Stain NO ORGANISMS SEEN NO WBC SEEN   Final   Culture   Final    NO GROWTH 3 DAYS Performed at [facility], [place] [address] [place],  [address]    Report Status PENDING  Incomplete         Radiology Studies: No results found.      Scheduled Meds:  apixaban  5 mg Oral BID   docusate sodium  100 mg Oral BID   furosemide  40 mg Oral Daily   gabapentin  200 mg Oral QHS   metoprolol succinate  25 mg Oral Daily   pantoprazole  40 mg Oral BID   potassium chloride  20 mEq Oral Daily   potassium chloride  40 mEq Oral Once   simvastatin  10 mg  Oral QHS   spironolactone  12.5 mg Oral Daily   Continuous Infusions:  piperacillin-tazobactam (ZOSYN)  IV 3.375 g (05/18/22 0824)     LOS: 6 days    Time spent: 25 mins     Wyvonnia Dusky, MD [facility] Pager [phone]  If 7PM-7AM, please contact night-coverage [email or web address] 05/18/2022, 9:21 AM

## 2022-05-18 NOTE — Progress Notes (Signed)
Manufacturing engineer [facility]) Hospital Liaison Note  Received request from Transitions of Care Manager, Donny Pique, for hospice services at [facility] after discharge. Chart and patient information under review by [facility] physician.   Spoke with daughter/[name] to initiate education related to hospice philosophy, services, and team approach to care. [name] verbalized understanding of information given. Per discussion, the plan is for patient to discharge LTC via EMS once cleared to [place].    Facility to provide needed DME.    Please send signed and completed DNR LTC with patient/family. Please provide prescriptions at discharge as needed to ensure ongoing symptom management.    [facility] information and contact numbers given to family & above information shared with TOC.   Please call with any questions/concerns.    Thank you for the opportunity to participate in this patient's care.   Phillis Haggis, MSW [facility] Liaison  [phone]

## 2022-05-18 NOTE — TOC Progression Note (Signed)
Transition of Care [facility]) - Progression Note    Patient Details  Name: [name] MRN: [id number] Date of Birth: 1928/03/25  Transition of Care [facility]) CM/SW Contact  Laurena Slimmer, RN Phone Number: 05/18/2022, 2:15 PM  Clinical Narrative:    Spoke with Gena at [facility] to advise patient would be returning with hospice care via [facility].  Per Gena facility has an COVID outbreak. Patient daughter is agreeable for patient to return. MD, and hospice liaison notified.         Expected Discharge Plan and Services                                               Social Determinants of Health (SDOH) Interventions SDOH Screenings   Food Insecurity: No Food Insecurity (05/12/2022)  Housing: Low Risk  (05/12/2022)  Transportation Needs: No Transportation Needs (05/12/2022)  Utilities: Not At Risk (05/12/2022)  Depression (PHQ2-9): Low Risk  (11/30/2020)  Financial Resource Strain: Low Risk  (11/30/2020)  Physical Activity: Unknown (11/30/2020)  Social Connections: Unknown (11/30/2020)  Stress: No Stress Concern Present (11/30/2020)  Tobacco Use: Low Risk  (05/12/2022)    Readmission Risk Interventions    03/02/2022   12:47 PM 02/21/2022    9:54 AM  Readmission Risk Prevention Plan  Transportation Screening Complete Complete  Medication Review ([place]) Complete   PCP or Specialist appointment within 3-5 days of discharge Complete   HRI or Home Care Consult Complete Complete  SW Recovery Care/Counseling Consult Complete   Palliative Care Screening Complete Complete  Skilled Nursing Facility Complete

## 2022-05-18 NOTE — TOC Progression Note (Signed)
Transition of Care [facility]) - Progression Note    Patient Details  Name: [name] MRN: [id number] Date of Birth: 09/12/1928  Transition of Care [facility]) CM/SW Contact  Laurena Slimmer, RN Phone Number: 05/18/2022, 12:04 PM  Clinical Narrative:    Spoke with patient's daughter, [name] regarding hospice. She has chosen [facility].  Referral made to [facility] from [facility]         Expected Discharge Plan and Services                                               Social Determinants of Health (SDOH) Interventions SDOH Screenings   Food Insecurity: No Food Insecurity (05/12/2022)  Housing: Low Risk  (05/12/2022)  Transportation Needs: No Transportation Needs (05/12/2022)  Utilities: Not At Risk (05/12/2022)  Depression (PHQ2-9): Low Risk  (11/30/2020)  Financial Resource Strain: Low Risk  (11/30/2020)  Physical Activity: Unknown (11/30/2020)  Social Connections: Unknown (11/30/2020)  Stress: No Stress Concern Present (11/30/2020)  Tobacco Use: Low Risk  (05/12/2022)    Readmission Risk Interventions    03/02/2022   12:47 PM 02/21/2022    9:54 AM  Readmission Risk Prevention Plan  Transportation Screening Complete Complete  Medication Review ([place]) Complete   PCP or Specialist appointment within 3-5 days of discharge Complete   HRI or Home Care Consult Complete Complete  SW Recovery Care/Counseling Consult Complete   Palliative Care Screening Complete Complete  Skilled Nursing Facility Complete

## 2022-05-19 DIAGNOSIS — J9611 Chronic respiratory failure with hypoxia: Secondary | ICD-10-CM

## 2022-05-19 DIAGNOSIS — I5033 Acute on chronic diastolic (congestive) heart failure: Secondary | ICD-10-CM | POA: Diagnosis not present

## 2022-05-19 DIAGNOSIS — I480 Paroxysmal atrial fibrillation: Secondary | ICD-10-CM | POA: Diagnosis not present

## 2022-05-19 LAB — BASIC METABOLIC PANEL
Anion gap: 11 (ref 5–15)
BUN: 27 mg/dL — ABNORMAL HIGH (ref 8–23)
CO2: 30 mmol/L (ref 22–32)
Calcium: 9 mg/dL (ref 8.9–10.3)
Chloride: 95 mmol/L — ABNORMAL LOW (ref 98–111)
Creatinine, Ser: 1.07 mg/dL — ABNORMAL HIGH (ref 0.44–1.00)
GFR, Estimated: 49 mL/min — ABNORMAL LOW (ref >60)
Glucose, Bld: 80 mg/dL (ref 70–99)
Potassium: 4.4 mmol/L (ref 3.5–5.1)
Sodium: 136 mmol/L (ref 135–145)

## 2022-05-19 LAB — CBC
HCT: 39.2 % (ref 36.0–46.0)
Hemoglobin: 12 g/dL (ref 12.0–15.0)
MCH: 26.4 pg (ref 26.0–34.0)
MCHC: 30.6 g/dL (ref 30.0–36.0)
MCV: 86.2 fL (ref 80.0–100.0)
Platelets: 537 10*3/uL — ABNORMAL HIGH (ref 150–400)
RBC: 4.55 MIL/uL (ref 3.87–5.11)
RDW: 15.8 % — ABNORMAL HIGH (ref 11.5–15.5)
WBC: 10.7 10*3/uL — ABNORMAL HIGH (ref 4.0–10.5)
nRBC: 0 % (ref 0.0–0.2)

## 2022-05-19 LAB — MAGNESIUM: Magnesium: 2.3 mg/dL (ref 1.7–2.4)

## 2022-05-19 MED ORDER — OXYCODONE HCL 5 MG PO TABS: 5.0000 mg | ORAL_TABLET | ORAL | 0 refills | Status: AC | PRN

## 2022-05-19 MED ORDER — METOPROLOL SUCCINATE ER 25 MG PO TB24: 25.0000 mg | ORAL_TABLET | Freq: Every day | ORAL | Status: DC

## 2022-05-19 NOTE — TOC Transition Note (Addendum)
Transition of Care [facility]) - CM/SW Discharge Note   Patient Details  Name: [name] MRN: [id number] Date of Birth: Oct 06, 1928  Transition of Care [facility]) CM/SW Contact:  Laurena Slimmer, RN Phone Number: 05/19/2022, 12:24 PM   Clinical Narrative:     Per facility patient can be accepted today Spoke with Tammy in admissions Patient assigned room # 72 Nurse will call report to [phone] Patient, MD, nurse, and family notified.  Face sheet and medical necessity forms printed to the floor to be added to the EMS pack EMS arranged  Discharge summary and SNF tranfer report sent in [place].  TOC signing off.         Patient Goals and CMS Choice      Discharge Placement                         Discharge Plan and Services Additional resources added to the After Visit Summary for                                       Social Determinants of Health (SDOH) Interventions SDOH Screenings   Food Insecurity: No Food Insecurity (05/12/2022)  Housing: Low Risk  (05/12/2022)  Transportation Needs: No Transportation Needs (05/12/2022)  Utilities: Not At Risk (05/12/2022)  Depression (PHQ2-9): Low Risk  (11/30/2020)  Financial Resource Strain: Low Risk  (11/30/2020)  Physical Activity: Unknown (11/30/2020)  Social Connections: Unknown (11/30/2020)  Stress: No Stress Concern Present (11/30/2020)  Tobacco Use: Low Risk  (05/12/2022)     Readmission Risk Interventions    03/02/2022   12:47 PM 02/21/2022    9:54 AM  Readmission Risk Prevention Plan  Transportation Screening Complete Complete  Medication Review ([place]) Complete   PCP or Specialist appointment within 3-5 days of discharge Complete   HRI or Home Care Consult Complete Complete  SW Recovery Care/Counseling Consult Complete   Palliative Care Screening Complete Complete  Skilled Nursing Facility Complete

## 2022-05-19 NOTE — Progress Notes (Signed)
[facility])    If applicable, please send signed and completed DNR with patient/family upon discharge. Please provide prescriptions at discharge as needed to ensure ongoing symptom management and a transport packet.   [facility] information and contact numbers given to family and above information shared with TOC.    Please call with any questions/concerns.    Thank you for the opportunity to participate in this patient's care   Shania Junious, MSW [facility] Hospital Liaison  [phone] 

## 2022-05-19 NOTE — Discharge Summary (Addendum)
Physician Discharge Summary  [name] [id number] DOB: 1929/01/28 DOA: 05/11/2022  PCP: [name], MD  Admit date: 05/11/2022 Discharge date: 05/19/2022  Admitted From: [facility]  Disposition:  [facility] w/ hospice care  Recommendations for Outpatient Follow-up:  Follow up w/ hospice provider ASAP  Home Health: no  Equipment/Devices: chronically on 2L [place]   Discharge Condition: hospice CODE STATUS: DNR Diet recommendation: Regular as tolerated   Brief/Interim Summary: HPI was taken from Dr. Damita Dunnings: [name] is a 89 y.o. female with medical history significant for diastolic CHF(EF 50 to [id number] 11/2021), A-fib, PE in the setting of COVID-19 pneumonia 11/2021 on Eliquis, HTN, GI bleed while on Eliquis, now on Xarelto, hereditary hemochromatosis, colon cancer s/p surgical resection and chemo, GERD, ambulatory dysfunction, incontinence, who presents to the ED from rehab by EMS in acute respiratory distress on 6 L O2.  Patient has been hospitalized a few times over the past 5 months with COVID-pneumonia, influenza B pneumonia, acute PE, CHF with her last hospitalization mid-January for aspiration pneumonia.  She is not currently on home O2.  On arrival of EMS O2 sat was in the 70s.  She received Lasix, DuoNebs and Solu-Medrol and route.  Due to increased work of breathing on arrival to the ED, she was transitioned to BiPAP. ED course and data review: BP 162/65 with respirations 22 and O2 sat of 100% on 6 L with otherwise normal vitals.  VBG on BiPAP FiO2 40% with pH 7.42 and pCO2 45.  Labs otherwise significant for WBC 11,400 with lactic acid 0.9.  Hemoglobin at baseline at 7.3.  Troponin 21, and BNP 282, which appears to be her baseline.  Sodium 131.  COVID-negative EKG, personally viewed and interpreted shows A-fib at 112 with nonspecific ST-T wave changes. Chest x-ray compared to 02/28/2022 showing improved aeration in the left base with persistent small effusion, and mild basilar  atelectasis. Patient treated with IV Lasix 20 mg.  Patient reportedly had significant improvement with BiPAP and was transitioned to O2 via nasal cannula at 3 L with O2 sats at 96%.  Hospitalist consulted for admission.   As per Dr. Mal Misty: [name] is a 89 year old female with history of CHF with preserved EF, A-fib, PE on Eliquis, HTN, GI bleed, hereditary hemochromatosis, colon cancer status postsurgical resection, GERD, ambulatory dysfunction comes to the ED for acute respiratory distress requiring 6 L nasal cannula. Upon admission on room air she was hypoxic saturating as low as 70%. Initially placed on BiPAP. COVID swab was negative. She did have COVID about 6 months ago. In the ED patient was treated with IV diuretics. Due to worsening resp status, her lasix was increased.  She was also given empiric IV antibiotics for possible aspiration pneumonia.  She underwent left thoracentesis, 400 cc removed.   As per Dr. Jimmye Norman 4/3-05/19/22: Palliative was consulted and after a discussion w/ the pt and pt's family, they agreed to proceed w/ hospice. Pt was medically optimized and d/c back to [facility] w/ hospice care. Pt may have a regular diet as tolerated. For more information, please see previous progress/consult notes.   Discharge Diagnoses:  Principal Problem:   Acute on chronic heart failure with preserved ejection fraction Active Problems:   Acute respiratory failure with hypoxia   Paroxysmal atrial fibrillation   Hyponatremia   Essential hypertension   Anemia, iron deficiency   Hereditary hemochromatosis   PAD (peripheral artery disease)   History of pulmonary embolism October 2023  Acute on chronic diastolic  CHF: echo in 11/2021 showed EF 50-55%. Continue on lasix Monitor I/Os. Cardio following and recs apprec   Elevated troponins: minimal. Secondary to demand ischemia. No chest pain    Chronic hypoxic respiratory failure: continue on supplemental oxygen. Uses 2L [place] at home   UTI:  urine cx growing pseudomonas and enterococcus. Completed abxs     Aspiration/parapneumonic effusion: completed azithromycin. Hx of large hiatal hernia. Continue on bronchodilators & encourage incentive spirometry & flutter valve. Continue on PPI  S/P left thoracentesis 400cc removed. Body fluid neg for infection. Completed abx course    Hypokalemia: WNL    PAF: continue on metoprolol, eliquis    Hyponatremia: WNL today    HTN: continue on metoprolol    IDA: s/p 1 unit of pRBCs transfused so far. Will monitor H&H    Hx of PE: in October 2023 likely in setting of COVID19 pneumonia. Continue on eliquis.    PAD: continue on statin, eliquis     Discharge Instructions  Discharge Instructions     Diet general   Complete by: As directed    Discharge instructions   Complete by: As directed    F/u w/ hospice provider as soon as possible   Increase activity slowly   Complete by: As directed    No wound care   Complete by: As directed       Allergies as of 05/19/2022       Reactions   Fentanyl Other (See Comments)   confusion        Medication List     STOP taking these medications    predniSONE 5 MG tablet Commonly known as: DELTASONE       TAKE these medications    acetaminophen 500 MG tablet Commonly known as: TYLENOL Take 500 mg by mouth every 6 (six) hours as needed.   albuterol 108 (90 Base) MCG/ACT inhaler Commonly known as: VENTOLIN HFA TAKE 2 PUFFS BY MOUTH EVERY 6 HOURS AS NEEDED FOR WHEEZE OR SHORTNESS OF BREATH   apixaban 5 MG Tabs tablet Commonly known as: ELIQUIS Take 5 mg by mouth 2 (two) times daily.   calcium carbonate 1500 (600 Ca) MG Tabs tablet Commonly known as: OSCAL Take 600 mg of elemental calcium by mouth 2 (two) times daily with a meal.   docusate sodium 100 MG capsule Commonly known as: COLACE Take 100 mg by mouth 2 (two) times daily.   ferrous sulfate 325 (65 FE) MG EC tablet Take 325 mg by mouth daily with breakfast.    fexofenadine 60 MG tablet Commonly known as: ALLEGRA TAKE 1 TABLET EVERY DAY   furosemide 20 MG tablet Commonly known as: Lasix Take 1 tablet (20 mg total) by mouth daily.   gabapentin 100 MG capsule Commonly known as: NEURONTIN TAKE 2 CAPSULES AT BEDTIME What changed: additional instructions   guaiFENesin-dextromethorphan 100-10 MG/5ML syrup Commonly known as: ROBITUSSIN DM Take 10 mLs by mouth every 4 (four) hours as needed for cough.   ipratropium 0.06 % nasal spray Commonly known as: ATROVENT Place 2 sprays into both nostrils 4 (four) times daily.   ipratropium-albuterol 0.5-2.5 (3) MG/3ML Soln Commonly known as: DUONEB Take 3 mLs by nebulization every 4 (four) hours as needed.   metoprolol succinate 25 MG 24 hr tablet Commonly known as: TOPROL-XL Take 1 tablet (25 mg total) by mouth daily. What changed: when to take this   oxyCODONE 5 MG immediate release tablet Commonly known as: Oxy IR/ROXICODONE Take 1 tablet (5 mg total) by  mouth every 4 (four) hours as needed for up to 2 days for moderate pain, severe pain or breakthrough pain.   pantoprazole 40 MG tablet Commonly known as: PROTONIX Take 40 mg by mouth 2 (two) times daily.   potassium chloride SA 20 MEQ tablet Commonly known as: KLOR-CON M Take 1 tablet (20 mEq total) by mouth daily.   simvastatin 10 MG tablet Commonly known as: ZOCOR TAKE 1 TABLET AT BEDTIME   Vitamin D3 50 MCG (2000 UT) Caps Generic drug: Cholecalciferol Take 2,000 Units by mouth daily.        Allergies  Allergen Reactions   Fentanyl Other (See Comments)    confusion    Consultations: Cardio Palliative care  hospice   Procedures/Studies: US THORACENTESIS ASP PLEURAL SPACE W/IMG GUIDE  Result Date: 05/14/2022 INDICATION: Patient with pneumonia and left pleural effusion. Request for diagnostic and therapeutic thoracentesis. EXAM: ULTRASOUND GUIDED LEFT THORACENTESIS MEDICATIONS: 1% lidocaine 10 mL COMPLICATIONS: None  immediate. PROCEDURE: An ultrasound guided thoracentesis was thoroughly discussed with the patient and questions answered. The benefits, risks, alternatives and complications were also discussed. The patient understands and wishes to proceed with the procedure. Written consent was obtained. Ultrasound was performed to localize and mark an adequate pocket of fluid in the left chest. The area was then prepped and draped in the normal sterile fashion. 1% Lidocaine was used for local anesthesia. Under ultrasound guidance a 6 Fr Safe-T-Centesis catheter was introduced. Thoracentesis was performed. The catheter was removed and a dressing applied. FINDINGS: A total of approximately 400 mL of clear yellow fluid was removed. Samples were sent to the laboratory as requested by the clinical team. IMPRESSION: Successful ultrasound guided left thoracentesis yielding 400 mL of pleural fluid. No pneumothorax on post-procedure chest x-ray. Procedure performed by: Gareth Eagle, PA-C Electronically Signed   By: Aletta Edouard M.D.   On: 05/14/2022 14:38   DG Chest 1 View  Result Date: 05/14/2022 CLINICAL DATA:  Status post left thoracentesis EXAM: CHEST  1 VIEW COMPARISON:  Ultrasound thoracentesis dated 05/14/2022, chest radiograph dated 05/14/2022 at 7:16 a.m. FINDINGS: Normal lung volumes. Left basilar patchy opacities. Retrocardiac lucency. Minimal blunting of the left costophrenic angle. No definite pneumothorax. Similar cardiomediastinal silhouette. The visualized skeletal structures are unremarkable. IMPRESSION: 1. No definite pneumothorax status post left thoracentesis. Minimal blunting of the left costophrenic angle may reflect trace residual pleural effusion. 2. Left basilar patchy opacities, likely atelectasis. Aspiration or pneumonia can be considered in the appropriate clinical setting. 3. Retrocardiac lucency, which may represent a hiatal hernia. Electronically Signed   By: Darrin Nipper M.D.   On: 05/14/2022 13:53    DG Chest Port 1 View  Result Date: 05/14/2022 CLINICAL DATA:  89 year old female with history of hypoxia. EXAM: PORTABLE CHEST 1 VIEW COMPARISON:  Chest x-ray 05/12/2022. FINDINGS: Worsening aeration throughout the left mid to lower lung, concerning for progressive airspace consolidation. Moderate left pleural effusion. Right lung is clear. No right pleural effusion. No pneumothorax. Chronic bilateral apical pleural-parenchymal thickening (right-greater-than-left), similar to prior studies, presumably chronic post infectious or inflammatory scarring. No evidence of pulmonary edema. Heart size is mildly enlarged. Upper mediastinal contours are within normal limits. IMPRESSION: 1. Worsening aeration in the left lower lobe concerning for left lower lobe pneumonia with increasing moderate left parapneumonic pleural effusion. 2. Mild cardiomegaly. 3. Aortic atherosclerosis. Electronically Signed   By: Vinnie Langton M.D.   On: 05/14/2022 07:27   DG Chest 1 View  Result Date: 05/12/2022 CLINICAL DATA:  Shortness of  breath EXAM: PORTABLE CHEST 1 VIEW COMPARISON:  02/28/2022 FINDINGS: Cardiac shadow is enlarged but stable. Aortic calcifications are seen. The lungs are well aerated bilaterally. Persistent small effusion is noted on the left although improved when compare with the prior exam. Some linear atelectatic changes are noted bilaterally. No bony abnormality is seen. IMPRESSION: Improved aeration in the left base with persistent small effusion. Mild basilar atelectasis is noted. Electronically Signed   By: Inez Catalina M.D.   On: 05/12/2022 00:21   (Echo, Carotid, EGD, Colonoscopy, ERCP)    Subjective: Pt denies any complaints    Discharge Exam: Vitals:   05/19/22 0830 05/19/22 1158  BP: (!) 135/58 (!) 153/58  Pulse:    Resp: 11 18  Temp: 97.6 F (36.4 C) 97.6 F (36.4 C)  SpO2:     Vitals:   05/19/22 0000 05/19/22 0300 05/19/22 0830 05/19/22 1158  BP: (!) 138/50 (!) 118/48 (!) 135/58  (!) 153/58  Pulse: (!) 53 (!) 56    Resp: 11 14 11 18   Temp: 97.6 F (36.4 C) 97.8 F (36.6 C) 97.6 F (36.4 C) 97.6 F (36.4 C)  TempSrc: Axillary Axillary Axillary Axillary  SpO2: 100% 100%    Weight:   62 kg     General: Pt is alert, awake, not in acute distress Cardiovascular: S1/S2 +, no rubs, no gallops Respiratory: decreased breath sounds b/l  Abdominal: Soft, NT, ND, bowel sounds + Extremities: no edema, no cyanosis    The results of significant diagnostics from this hospitalization (including imaging, microbiology, ancillary and laboratory) are listed below for reference.     Microbiology: Recent Results (from the past 240 hour(s))  SARS Coronavirus 2 by RT PCR (hospital order, performed in [facility] hospital lab) *cepheid single result test* Anterior Nasal Swab     Status: None   Collection Time: 05/12/22 12:09 AM   Specimen: Anterior Nasal Swab  Result Value Ref Range Status   SARS Coronavirus 2 by RT PCR NEGATIVE NEGATIVE Final    Comment: (NOTE) SARS-CoV-2 target nucleic acids are NOT DETECTED.  The SARS-CoV-2 RNA is generally detectable in upper and lower respiratory specimens during the acute phase of infection. The lowest concentration of SARS-CoV-2 viral copies this assay can detect is 250 copies / mL. A negative result does not preclude SARS-CoV-2 infection and should not be used as the sole basis for treatment or other patient management decisions.  A negative result may occur with improper specimen collection / handling, submission of specimen other than nasopharyngeal swab, presence of viral mutation(s) within the areas targeted by this assay, and inadequate number of viral copies (<250 copies / mL). A negative result must be combined with clinical observations, patient history, and epidemiological information.  Fact Sheet for Patients:   https://www.patel.info/  Fact Sheet for Healthcare  Providers: https://hall.com/  This test is not yet approved or  cleared by the [place] FDA and has been authorized for detection and/or diagnosis of SARS-CoV-2 by FDA under an Emergency Use Authorization (EUA).  This EUA will remain in effect (meaning this test can be used) for the duration of the COVID-19 declaration under Section 564(b)(1) of the Act, 21 U.S.C. section 360bbb-3(b)(1), unless the authorization is terminated or revoked sooner.  Performed at [facility], [address], [place], [place] [address]   Urine Culture (for pregnant, neutropenic or urologic patients or patients with an indwelling urinary catheter)     Status: Abnormal   Collection Time: 05/13/22  9:03 AM  Specimen: Urine, Clean Catch  Result Value Ref Range Status   Specimen Description   Final    URINE, CLEAN CATCH Performed at [facility], [address] [place], [place] [address]    Special Requests   Final    NONE Performed at [facility], [address] [place], [place] [address]    Culture (A)  Final    >=100,000 COLONIES/mL PSEUDOMONAS AERUGINOSA >=100,000 COLONIES/mL ENTEROCOCCUS FAECALIS VANCOMYCIN RESISTANT ENTEROCOCCUS ISOLATED    Report Status 05/17/2022 FINAL  Final   Organism ID, Bacteria PSEUDOMONAS AERUGINOSA (A)  Final   Organism ID, Bacteria ENTEROCOCCUS FAECALIS (A)  Final      Susceptibility   Enterococcus faecalis - MIC*    AMPICILLIN <=2 SENSITIVE Sensitive     NITROFURANTOIN <=16 SENSITIVE Sensitive     VANCOMYCIN >=32 RESISTANT Resistant     LINEZOLID Value in next row Sensitive      SENSITIVE2    * >=100,000 COLONIES/mL ENTEROCOCCUS FAECALIS   Pseudomonas aeruginosa - MIC*    CEFTAZIDIME Value in next row Sensitive      SENSITIVE2    CIPROFLOXACIN Value in next row Sensitive      SENSITIVE2    GENTAMICIN Value in next row Sensitive      SENSITIVE2    IMIPENEM Value in next row Sensitive      SENSITIVE2     PIP/TAZO Value in next row Sensitive      SENSITIVE2    CEFEPIME Value in next row Sensitive      SENSITIVE2    * >=100,000 COLONIES/mL PSEUDOMONAS AERUGINOSA  Body fluid culture w Gram Stain     Status: None   Collection Time: 05/14/22  1:15 PM   Specimen: PATH Cytology Pleural fluid  Result Value Ref Range Status   Specimen Description   Final    PLEURAL Performed at [facility], [address] [place], [place] [address]    Special Requests   Final    CYTO PLEU Performed at [facility], [address] [place], [place] [address]    Gram Stain NO ORGANISMS SEEN NO WBC SEEN   Final   Culture   Final    NO GROWTH 3 DAYS Performed at [facility], [place] [address], [place],  [address]    Report Status 05/18/2022 FINAL  Final     Labs: BNP (last 3 results) Recent Labs    02/19/22 1546 02/28/22 1142 05/12/22 0009  BNP 418.1* 340.4* [id number]*   Basic Metabolic Panel: Recent Labs  Lab 05/15/22 0537 05/15/22 1629 05/16/22 0626 05/17/22 0537 05/18/22 0554 05/19/22 0616  NA 139  --  137 137 134* 136  K 2.8* 4.6 3.3* 3.7 3.1* 4.4  CL 94*  --  91* 93* 90* 95*  CO2 37*  --  34* 35* 33* 30  GLUCOSE 114*  --  101* 100* 100* 80  BUN 26*  --  23 26* 28* 27*  CREATININE 0.77  --  0.82 0.91 1.06* 1.07*  CALCIUM 8.7*  --  9.1 9.0 9.1 9.0  MG 2.1  --  2.0 2.2 2.3 2.3   Liver Function Tests: No results for input(s): "AST", "ALT", "ALKPHOS", "BILITOT", "PROT", "ALBUMIN" in the last 168 hours. No results for input(s): "LIPASE", "AMYLASE" in the last 168 hours. No results for input(s): "AMMONIA" in the last 168 hours. CBC: Recent Labs  Lab 05/15/22 0537 05/16/22 0626 05/17/22 0537 05/18/22 0554 05/19/22 0616  WBC 7.7 9.6 10.2  11.3* 10.7*  HGB 9.5* 10.2* 10.2* 11.0* 12.0  HCT 30.2* 32.4* 32.5* 34.7* 39.2  MCV 85.3 84.2 83.3 82.2 86.2  PLT 481* 524* 559* 580* 537*   Cardiac Enzymes: No results for input(s): "CKTOTAL", "CKMB",  "CKMBINDEX", "TROPONINI" in the last 168 hours. BNP: Invalid input(s): "POCBNP" CBG: No results for input(s): "GLUCAP" in the last 168 hours. D-Dimer No results for input(s): "DDIMER" in the last 72 hours. Hgb A1c No results for input(s): "HGBA1C" in the last 72 hours. Lipid Profile No results for input(s): "CHOL", "HDL", "LDLCALC", "TRIG", "CHOLHDL", "LDLDIRECT" in the last 72 hours. Thyroid function studies No results for input(s): "TSH", "T4TOTAL", "T3FREE", "THYROIDAB" in the last 72 hours.  Invalid input(s): "FREET3" Anemia work up No results for input(s): "VITAMINB12", "FOLATE", "FERRITIN", "TIBC", "IRON", "RETICCTPCT" in the last 72 hours. Urinalysis    Component Value Date/Time   COLORURINE YELLOW (A) 05/13/2022 1452   APPEARANCEUR CLOUDY (A) 05/13/2022 1452   LABSPEC 1.008 05/13/2022 1452   PHURINE 5.0 05/13/2022 1452   GLUCOSEU NEGATIVE 05/13/2022 1452   GLUCOSEU NEGATIVE 04/04/2018 1255   HGBUR MODERATE (A) 05/13/2022 1452   BILIRUBINUR NEGATIVE 05/13/2022 1452   KETONESUR NEGATIVE 05/13/2022 1452   PROTEINUR NEGATIVE 05/13/2022 1452   UROBILINOGEN 0.2 04/04/2018 1255   NITRITE NEGATIVE 05/13/2022 1452   LEUKOCYTESUR LARGE (A) 05/13/2022 1452   Sepsis Labs Recent Labs  Lab 05/16/22 0626 05/17/22 0537 05/18/22 0554 05/19/22 0616  WBC 9.6 10.2 11.3* 10.7*   Microbiology Recent Results (from the past 240 hour(s))  SARS Coronavirus 2 by RT PCR (hospital order, performed in [place] hospital lab) *cepheid single result test* Anterior Nasal Swab     Status: None   Collection Time: 05/12/22 12:09 AM   Specimen: Anterior Nasal Swab  Result Value Ref Range Status   SARS Coronavirus 2 by RT PCR NEGATIVE NEGATIVE Final    Comment: (NOTE) SARS-CoV-2 target nucleic acids are NOT DETECTED.  The SARS-CoV-2 RNA is generally detectable in upper and lower respiratory specimens during the acute phase of infection. The lowest concentration of SARS-CoV-2 viral copies  this assay can detect is 250 copies / mL. A negative result does not preclude SARS-CoV-2 infection and should not be used as the sole basis for treatment or other patient management decisions.  A negative result may occur with improper specimen collection / handling, submission of specimen other than nasopharyngeal swab, presence of viral mutation(s) within the areas targeted by this assay, and inadequate number of viral copies (<250 copies / mL). A negative result must be combined with clinical observations, patient history, and epidemiological information.  Fact Sheet for Patients:   https://www.patel.info/  Fact Sheet for Healthcare Providers: https://hall.com/  This test is not yet approved or  cleared by the [place] FDA and has been authorized for detection and/or diagnosis of SARS-CoV-2 by FDA under an Emergency Use Authorization (EUA).  This EUA will remain in effect (meaning this test can be used) for the duration of the COVID-19 declaration under Section 564(b)(1) of the Act, 21 U.S.C. section 360bbb-3(b)(1), unless the authorization is terminated or revoked sooner.  Performed at [facility], [address], [place], [place] [address]   Urine Culture (for pregnant, neutropenic or urologic patients or patients with an indwelling urinary catheter)     Status: Abnormal   Collection Time: 05/13/22  9:03 AM   Specimen: Urine, Clean Catch  Result Value Ref Range Status   Specimen Description   Final    URINE, CLEAN  CATCH Performed at [facility], [address], [place], [place] [address]    Special Requests   Final    NONE Performed at [facility], [address] [place], [place] [address]    Culture (A)  Final    >=100,000 COLONIES/mL PSEUDOMONAS AERUGINOSA >=100,000 COLONIES/mL ENTEROCOCCUS FAECALIS VANCOMYCIN RESISTANT ENTEROCOCCUS ISOLATED    Report Status 05/17/2022 FINAL  Final    Organism ID, Bacteria PSEUDOMONAS AERUGINOSA (A)  Final   Organism ID, Bacteria ENTEROCOCCUS FAECALIS (A)  Final      Susceptibility   Enterococcus faecalis - MIC*    AMPICILLIN <=2 SENSITIVE Sensitive     NITROFURANTOIN <=16 SENSITIVE Sensitive     VANCOMYCIN >=32 RESISTANT Resistant     LINEZOLID Value in next row Sensitive      SENSITIVE2    * >=100,000 COLONIES/mL ENTEROCOCCUS FAECALIS   Pseudomonas aeruginosa - MIC*    CEFTAZIDIME Value in next row Sensitive      SENSITIVE2    CIPROFLOXACIN Value in next row Sensitive      SENSITIVE2    GENTAMICIN Value in next row Sensitive      SENSITIVE2    IMIPENEM Value in next row Sensitive      SENSITIVE2    PIP/TAZO Value in next row Sensitive      SENSITIVE2    CEFEPIME Value in next row Sensitive      SENSITIVE2    * >=100,000 COLONIES/mL PSEUDOMONAS AERUGINOSA  Body fluid culture w Gram Stain     Status: None   Collection Time: 05/14/22  1:15 PM   Specimen: PATH Cytology Pleural fluid  Result Value Ref Range Status   Specimen Description   Final    PLEURAL Performed at [facility], [address], [place], [place] [address]    Special Requests   Final    CYTO PLEU Performed at [facility], [address] [place], [place] [address]    Gram Stain NO ORGANISMS SEEN NO WBC SEEN   Final   Culture   Final    NO GROWTH 3 DAYS Performed at [facility], [place] [address] [place], [place] [address]    Report Status 05/18/2022 FINAL  Final     Time coordinating discharge: Over 30 minutes  SIGNED:   Wyvonnia Dusky, MD  [facility] 05/19/2022, 12:04 PM Pager   If 7PM-7AM, please contact night-coverage [email or web address]

## 2022-05-19 NOTE — NC FL2 (Signed)
[place] LEVEL OF CARE FORM     IDENTIFICATION  Patient Name: [name] Birthdate: Dec 13, 1928 Sex: female Admission Date (Current Location): 05/11/2022  [place] and [place] Number:  Engineering geologist and Address:  [facility], [address], [place], [place] [address]      Provider Number: [id number]  Attending Physician Name and Address:  Wyvonnia Dusky, MD  Relative Name and Phone Number:  Ihor Dow  Daughter  [phone]    Current Level of Care: Hospital Recommended Level of Care: [place] Prior Approval Number:    Date Approved/Denied:   PASRR Number: [id number]  Discharge Plan: SNF    Current Diagnoses: Patient Active Problem List   Diagnosis Date Noted   History of pulmonary embolism October 2023 05/12/2022   DNR (do not resuscitate) 03/01/2022   Chronic diastolic CHF (congestive heart failure) 02/28/2022   Normocytic anemia 02/28/2022   Acute renal failure superimposed on stage 3a chronic kidney disease [id number]   Acute metabolic encephalopathy Q000111Q   Aspiration pneumonia 02/28/2022   (HFpEF) heart failure with preserved ejection fraction 02/19/2022   SOB (shortness of breath) 12/14/2021   Pulmonary embolism 12/14/2021   Shortness of breath 12/13/2021   Pneumonia due to COVID-19 virus 12/13/2021   Hyperkalemia 12/13/2021   Hyponatremia 12/13/2021   At risk for polypharmacy 12/13/2021   Acute on chronic diastolic heart failure [id number]   Acute on chronic heart failure with preserved ejection fraction 11/29/2021   Paroxysmal atrial fibrillation 11/16/2021   Blood loss anemia 11/16/2021   Acute respiratory failure with hypoxia 11/16/2021   Elevated temperature 11/16/2021   Aortic atherosclerosis 08/02/2020   Fall 12/29/2019   Weakness 06/09/2019   Wheezing 04/16/2019   Back skin lesion 12/04/2018   Restless legs syndrome 11/25/2018   PAD (peripheral artery disease)  04/07/2018   Cold foot 05/15/2017   Constipation 08/15/2016   AKI (acute kidney injury) 04/13/2016   Small bowel obstruction 11/24/2015   Swelling of left lower extremity 07/16/2015   Acute Ileitis 06/12/2015   Hypokalemia 06/12/2015   Accelerated hypertension 06/12/2015   Hereditary hemochromatosis 05/25/2015   Open leg wound 05/19/2015   Lower extremity edema 05/19/2015   Iron excess 04/16/2015   Dizziness 10/26/2014   Hyperbilirubinemia 10/26/2014   Health care maintenance 05/18/2014   Anemia, iron deficiency 04/21/2014   Hospital discharge follow-up 04/14/2014   Hot flashes 03/07/2014   Face lesion 11/03/2013   Numbness in feet 10/08/2012   Nocturia 10/08/2012   Essential hypertension 12/18/2011   Hypercholesteremia 12/18/2011   History of colon cancer 12/18/2011   Barrett's esophagus 12/18/2011   Chronic back pain 12/18/2011   Osteopenia 12/18/2011    Orientation RESPIRATION BLADDER Height & Weight     Self, Place, Time  O2 (2 liters nasal cannula) External catheter Weight: 62 kg Height:     BEHAVIORAL SYMPTOMS/MOOD NEUROLOGICAL BOWEL NUTRITION STATUS  Other (Comment) (n/a)  (n/a) Incontinent Diet (Heart)  AMBULATORY STATUS COMMUNICATION OF NEEDS Skin   Limited Assist Verbally PU Stage and Appropriate Care, Skin abrasions (Foam dressing to stage 2 buttocks, ecchymosis bilateral groin, leg)   PU Stage 2 Dressing: Daily                   Personal Care Assistance Level of Assistance  Bathing, Feeding, Dressing Bathing Assistance: Limited assistance Feeding assistance: Limited assistance Dressing Assistance: Limited assistance     Functional Limitations Info  Sight, Hearing Sight Info: Impaired Hearing Info: Impaired  SPECIAL CARE FACTORS FREQUENCY                       Contractures Contractures Info: Not present    Additional Factors Info  Code Status, Allergies Code Status Info: DNR Allergies Info: Fentanyl           Current  Medications (05/19/2022):  This is the current hospital active medication list Current Facility-Administered Medications  Medication Dose Route Frequency Provider Last Rate Last Admin   acetaminophen (TYLENOL) tablet 650 mg  650 mg Oral Q6H PRN Athena Masse, MD   650 mg at 05/18/22 2151   Or   acetaminophen (TYLENOL) suppository 650 mg  650 mg Rectal Q6H PRN Athena Masse, MD       apixaban Arne Cleveland) tablet 5 mg  5 mg Oral BID Athena Masse, MD   5 mg at 05/19/22 1033   docusate sodium (COLACE) capsule 100 mg  100 mg Oral BID Athena Masse, MD   100 mg at 05/19/22 1034   furosemide (LASIX) tablet 40 mg  40 mg Oral Daily Tristan Schroeder, PA-C   40 mg at 05/19/22 1033   gabapentin (NEURONTIN) capsule 200 mg  200 mg Oral QHS Athena Masse, MD   200 mg at 05/18/22 2151   guaiFENesin (ROBITUSSIN) 100 MG/5ML liquid 5 mL  5 mL Oral Q4H PRN Damita Lack, MD   5 mL at 05/18/22 2150   hydrALAZINE (APRESOLINE) injection 10 mg  10 mg Intravenous Q4H PRN Amin, Ankit Chirag, MD       ipratropium-albuterol (DUONEB) 0.5-2.5 (3) MG/3ML nebulizer solution 3 mL  3 mL Nebulization Q4H PRN Amin, Ankit Chirag, MD       metoprolol succinate (TOPROL-XL) 24 hr tablet 25 mg  25 mg Oral Daily Judd Gaudier V, MD   25 mg at 05/19/22 1034   metoprolol tartrate (LOPRESSOR) injection 5 mg  5 mg Intravenous Q4H PRN Amin, Ankit Chirag, MD       ondansetron (ZOFRAN) tablet 4 mg  4 mg Oral Q6H PRN Athena Masse, MD       Or   ondansetron [facility]) injection 4 mg  4 mg Intravenous Q6H PRN Athena Masse, MD       oxyCODONE (Oxy IR/ROXICODONE) immediate release tablet 5 mg  5 mg Oral Q4H PRN Amin, Ankit Chirag, MD       pantoprazole (PROTONIX) EC tablet 40 mg  40 mg Oral BID Judd Gaudier V, MD   40 mg at 05/19/22 1034   piperacillin-tazobactam (ZOSYN) IVPB 3.375 g  3.375 g Intravenous Q8H Amin, Ankit Chirag, MD 12.5 mL/hr at 05/19/22 0907 3.375 g at 05/19/22 0907   potassium chloride SA (KLOR-CON M) CR tablet  20 mEq  20 mEq Oral Daily Tristan Schroeder, PA-C   20 mEq at 05/19/22 1033   senna-docusate (Senokot-S) tablet 1 tablet  1 tablet Oral QHS PRN Amin, Ankit Chirag, MD       simvastatin (ZOCOR) tablet 10 mg  10 mg Oral QHS Judd Gaudier V, MD   10 mg at 05/18/22 2150   spironolactone (ALDACTONE) tablet 12.5 mg  12.5 mg Oral Daily Tristan Schroeder, PA-C   12.5 mg at 05/19/22 1034   traZODone (DESYREL) tablet 50 mg  50 mg Oral QHS PRN Damita Lack, MD   50 mg at 05/13/22 2126     Discharge Medications: Please see discharge summary for a list of discharge medications.  Relevant Imaging Results:  Relevant Lab Results:   Additional Information SS# [id number]  Laurena Slimmer, RN

## 2022-05-19 NOTE — Progress Notes (Signed)
Report called to [facility], spoke with rachel RN. Patient status and discharge gone over with Apolonio Schneiders. Patient will transport via EMS on 2L 02. Discharge instructions placed in packet as well as DNR form. Will send patient with EMS staff

## 2022-05-19 NOTE — Progress Notes (Signed)
Ems on the floor to transport patient to [facility]. 02 at 2L, no distress noted. Packet given to ems.

## 2022-05-23 ENCOUNTER — Encounter: Payer: Medicare HMO | Admitting: Family

## 2022-05-23 DIAGNOSIS — Z515 Encounter for palliative care: Secondary | ICD-10-CM | POA: Diagnosis not present

## 2022-05-23 DIAGNOSIS — I5033 Acute on chronic diastolic (congestive) heart failure: Secondary | ICD-10-CM | POA: Diagnosis not present

## 2022-05-23 DIAGNOSIS — J9611 Chronic respiratory failure with hypoxia: Secondary | ICD-10-CM | POA: Diagnosis not present

## 2022-05-23 DIAGNOSIS — R053 Chronic cough: Secondary | ICD-10-CM | POA: Diagnosis not present

## 2022-05-24 DIAGNOSIS — I10 Essential (primary) hypertension: Secondary | ICD-10-CM | POA: Diagnosis not present

## 2022-05-26 DIAGNOSIS — R053 Chronic cough: Secondary | ICD-10-CM | POA: Diagnosis not present

## 2022-05-26 DIAGNOSIS — D509 Iron deficiency anemia, unspecified: Secondary | ICD-10-CM | POA: Diagnosis not present

## 2022-05-26 DIAGNOSIS — I10 Essential (primary) hypertension: Secondary | ICD-10-CM | POA: Diagnosis not present

## 2022-05-26 DIAGNOSIS — I5033 Acute on chronic diastolic (congestive) heart failure: Secondary | ICD-10-CM | POA: Diagnosis not present

## 2022-05-26 DIAGNOSIS — J9611 Chronic respiratory failure with hypoxia: Secondary | ICD-10-CM | POA: Diagnosis not present

## 2022-05-30 DIAGNOSIS — S81802D Unspecified open wound, left lower leg, subsequent encounter: Secondary | ICD-10-CM | POA: Diagnosis not present

## 2022-05-30 DIAGNOSIS — J9611 Chronic respiratory failure with hypoxia: Secondary | ICD-10-CM | POA: Diagnosis not present

## 2022-05-30 LAB — MISC LABCORP TEST (SEND OUT)
LabCorp test name: 5367
Labcorp test code: 9985

## 2022-06-01 DIAGNOSIS — I10 Essential (primary) hypertension: Secondary | ICD-10-CM | POA: Diagnosis not present

## 2022-06-02 DIAGNOSIS — D509 Iron deficiency anemia, unspecified: Secondary | ICD-10-CM | POA: Diagnosis not present

## 2022-06-02 DIAGNOSIS — B379 Candidiasis, unspecified: Secondary | ICD-10-CM | POA: Diagnosis not present

## 2022-06-02 DIAGNOSIS — J9611 Chronic respiratory failure with hypoxia: Secondary | ICD-10-CM | POA: Diagnosis not present

## 2022-06-02 DIAGNOSIS — S81802D Unspecified open wound, left lower leg, subsequent encounter: Secondary | ICD-10-CM | POA: Diagnosis not present

## 2022-06-03 DIAGNOSIS — N390 Urinary tract infection, site not specified: Secondary | ICD-10-CM | POA: Diagnosis not present

## 2022-06-06 DIAGNOSIS — S81802D Unspecified open wound, left lower leg, subsequent encounter: Secondary | ICD-10-CM | POA: Diagnosis not present

## 2022-06-06 DIAGNOSIS — J9611 Chronic respiratory failure with hypoxia: Secondary | ICD-10-CM | POA: Diagnosis not present

## 2022-06-06 DIAGNOSIS — L299 Pruritus, unspecified: Secondary | ICD-10-CM | POA: Diagnosis not present

## 2022-06-07 DIAGNOSIS — N390 Urinary tract infection, site not specified: Secondary | ICD-10-CM | POA: Diagnosis not present

## 2022-06-07 DIAGNOSIS — L539 Erythematous condition, unspecified: Secondary | ICD-10-CM | POA: Diagnosis not present

## 2022-06-09 DIAGNOSIS — S81802D Unspecified open wound, left lower leg, subsequent encounter: Secondary | ICD-10-CM | POA: Diagnosis not present

## 2022-06-09 DIAGNOSIS — L539 Erythematous condition, unspecified: Secondary | ICD-10-CM | POA: Diagnosis not present

## 2022-06-09 DIAGNOSIS — L299 Pruritus, unspecified: Secondary | ICD-10-CM | POA: Diagnosis not present

## 2022-06-09 DIAGNOSIS — N390 Urinary tract infection, site not specified: Secondary | ICD-10-CM | POA: Diagnosis not present

## 2022-06-21 DIAGNOSIS — B379 Candidiasis, unspecified: Secondary | ICD-10-CM | POA: Diagnosis not present

## 2022-06-29 DIAGNOSIS — B379 Candidiasis, unspecified: Secondary | ICD-10-CM | POA: Diagnosis not present

## 2022-06-29 DIAGNOSIS — K5900 Constipation, unspecified: Secondary | ICD-10-CM | POA: Diagnosis not present

## 2022-07-04 ENCOUNTER — Telehealth: Payer: Self-pay | Admitting: Internal Medicine

## 2022-07-04 NOTE — Telephone Encounter (Signed)
Pt husband called stating pt passed away yesterday at 10am

## 2022-07-05 NOTE — Telephone Encounter (Signed)
Called and spoke to Mr [place]

## 2022-07-16 DEATH — deceased
